# Patient Record
Sex: Female | Born: 1945
Health system: Southern US, Community
[De-identification: ages and names within clinical notes are randomized; demographics above are authoritative.]

## PROBLEM LIST (undated history)

## (undated) DIAGNOSIS — I509 Heart failure, unspecified: Secondary | ICD-10-CM

## (undated) DIAGNOSIS — Z9889 Other specified postprocedural states: Secondary | ICD-10-CM

## (undated) DIAGNOSIS — K219 Gastro-esophageal reflux disease without esophagitis: Secondary | ICD-10-CM

## (undated) DIAGNOSIS — F419 Anxiety disorder, unspecified: Secondary | ICD-10-CM

## (undated) DIAGNOSIS — E785 Hyperlipidemia, unspecified: Secondary | ICD-10-CM

## (undated) DIAGNOSIS — R87629 Unspecified abnormal cytological findings in specimens from vagina: Secondary | ICD-10-CM

## (undated) DIAGNOSIS — M109 Gout, unspecified: Secondary | ICD-10-CM

## (undated) DIAGNOSIS — T7840XA Allergy, unspecified, initial encounter: Secondary | ICD-10-CM

## (undated) DIAGNOSIS — M1990 Unspecified osteoarthritis, unspecified site: Secondary | ICD-10-CM

## (undated) DIAGNOSIS — J449 Chronic obstructive pulmonary disease, unspecified: Secondary | ICD-10-CM

## (undated) DIAGNOSIS — M545 Low back pain, unspecified: Secondary | ICD-10-CM

## (undated) DIAGNOSIS — F32A Depression, unspecified: Secondary | ICD-10-CM

## (undated) DIAGNOSIS — R112 Nausea with vomiting, unspecified: Secondary | ICD-10-CM

## (undated) DIAGNOSIS — I219 Acute myocardial infarction, unspecified: Secondary | ICD-10-CM

## (undated) DIAGNOSIS — R0600 Dyspnea, unspecified: Secondary | ICD-10-CM

## (undated) DIAGNOSIS — R51 Headache: Secondary | ICD-10-CM

## (undated) DIAGNOSIS — I2510 Atherosclerotic heart disease of native coronary artery without angina pectoris: Secondary | ICD-10-CM

## (undated) DIAGNOSIS — H269 Unspecified cataract: Secondary | ICD-10-CM

## (undated) DIAGNOSIS — R011 Cardiac murmur, unspecified: Secondary | ICD-10-CM

## (undated) DIAGNOSIS — Z8489 Family history of other specified conditions: Secondary | ICD-10-CM

## (undated) DIAGNOSIS — N879 Dysplasia of cervix uteri, unspecified: Secondary | ICD-10-CM

## (undated) DIAGNOSIS — T8859XA Other complications of anesthesia, initial encounter: Secondary | ICD-10-CM

## (undated) DIAGNOSIS — G4730 Sleep apnea, unspecified: Secondary | ICD-10-CM

## (undated) DIAGNOSIS — N951 Menopausal and female climacteric states: Secondary | ICD-10-CM

## (undated) DIAGNOSIS — J40 Bronchitis, not specified as acute or chronic: Secondary | ICD-10-CM

## (undated) DIAGNOSIS — I10 Essential (primary) hypertension: Secondary | ICD-10-CM

## (undated) DIAGNOSIS — C801 Malignant (primary) neoplasm, unspecified: Secondary | ICD-10-CM

## (undated) DIAGNOSIS — N3281 Overactive bladder: Secondary | ICD-10-CM

## (undated) HISTORY — DX: Menopausal and female climacteric states: N95.1

## (undated) HISTORY — DX: Allergy, unspecified, initial encounter: T78.40XA

## (undated) HISTORY — PX: ANGIOPLASTY: SHX39

## (undated) HISTORY — DX: Acute myocardial infarction, unspecified: I21.9

## (undated) HISTORY — PX: BREAST SURGERY: SHX581

## (undated) HISTORY — PX: ABDOMINAL HYSTERECTOMY: SHX81

## (undated) HISTORY — DX: Unspecified cataract: H26.9

## (undated) HISTORY — PX: TONSILLECTOMY: SHX5217

## (undated) HISTORY — PX: COLONOSCOPY: SHX174

## (undated) HISTORY — DX: Unspecified abnormal cytological findings in specimens from vagina: R87.629

## (undated) HISTORY — DX: Hyperlipidemia, unspecified: E78.5

## (undated) HISTORY — DX: Dysplasia of cervix uteri, unspecified: N87.9

## (undated) HISTORY — DX: Overactive bladder: N32.81

## (undated) HISTORY — PX: EYE SURGERY: SHX253

## (undated) HISTORY — DX: Depression, unspecified: F32.A

## (undated) HISTORY — DX: Low back pain: M54.5

## (undated) HISTORY — DX: Gout, unspecified: M10.9

## (undated) HISTORY — PX: CARDIAC CATHETERIZATION: SHX172

## (undated) HISTORY — DX: Essential (primary) hypertension: I10

## (undated) HISTORY — DX: Sleep apnea, unspecified: G47.30

## (undated) HISTORY — DX: Heart failure, unspecified: I50.9

## (undated) HISTORY — DX: Low back pain, unspecified: M54.50

## (undated) HISTORY — DX: Headache: R51

---

## 1998-01-09 ENCOUNTER — Ambulatory Visit (HOSPITAL_COMMUNITY): Admission: RE | Admit: 1998-01-09 | Discharge: 1998-01-09 | Payer: Self-pay | Admitting: Internal Medicine

## 1999-03-26 ENCOUNTER — Ambulatory Visit (HOSPITAL_COMMUNITY): Admission: RE | Admit: 1999-03-26 | Discharge: 1999-03-26 | Payer: Self-pay | Admitting: Internal Medicine

## 1999-04-02 ENCOUNTER — Ambulatory Visit (HOSPITAL_COMMUNITY): Admission: RE | Admit: 1999-04-02 | Discharge: 1999-04-02 | Payer: Self-pay | Admitting: Internal Medicine

## 1999-07-23 ENCOUNTER — Other Ambulatory Visit: Admission: RE | Admit: 1999-07-23 | Discharge: 1999-07-23 | Payer: Self-pay | Admitting: Internal Medicine

## 1999-09-23 ENCOUNTER — Ambulatory Visit (HOSPITAL_COMMUNITY): Admission: RE | Admit: 1999-09-23 | Discharge: 1999-09-23 | Payer: Self-pay | Admitting: Internal Medicine

## 1999-09-23 ENCOUNTER — Encounter: Payer: Self-pay | Admitting: Internal Medicine

## 2001-01-26 ENCOUNTER — Ambulatory Visit (HOSPITAL_COMMUNITY): Admission: RE | Admit: 2001-01-26 | Discharge: 2001-01-26 | Payer: Self-pay | Admitting: Internal Medicine

## 2001-01-26 ENCOUNTER — Encounter: Payer: Self-pay | Admitting: Internal Medicine

## 2001-07-09 ENCOUNTER — Other Ambulatory Visit: Admission: RE | Admit: 2001-07-09 | Discharge: 2001-07-09 | Payer: Self-pay | Admitting: Internal Medicine

## 2002-04-08 ENCOUNTER — Ambulatory Visit (HOSPITAL_COMMUNITY): Admission: RE | Admit: 2002-04-08 | Discharge: 2002-04-08 | Payer: Self-pay | Admitting: Internal Medicine

## 2002-04-08 ENCOUNTER — Encounter: Payer: Self-pay | Admitting: Internal Medicine

## 2003-05-15 ENCOUNTER — Encounter: Payer: Self-pay | Admitting: Internal Medicine

## 2003-05-15 ENCOUNTER — Ambulatory Visit (HOSPITAL_COMMUNITY): Admission: RE | Admit: 2003-05-15 | Discharge: 2003-05-15 | Payer: Self-pay | Admitting: Internal Medicine

## 2004-09-02 ENCOUNTER — Ambulatory Visit: Payer: Self-pay | Admitting: Internal Medicine

## 2004-10-15 ENCOUNTER — Ambulatory Visit: Payer: Self-pay | Admitting: Internal Medicine

## 2004-11-25 ENCOUNTER — Ambulatory Visit: Payer: Self-pay | Admitting: Internal Medicine

## 2005-03-07 ENCOUNTER — Ambulatory Visit: Payer: Self-pay | Admitting: Internal Medicine

## 2005-04-18 ENCOUNTER — Ambulatory Visit: Payer: Self-pay | Admitting: Internal Medicine

## 2005-06-09 ENCOUNTER — Ambulatory Visit: Payer: Self-pay | Admitting: Internal Medicine

## 2005-06-14 ENCOUNTER — Ambulatory Visit: Payer: Self-pay | Admitting: Internal Medicine

## 2005-06-14 ENCOUNTER — Other Ambulatory Visit: Admission: RE | Admit: 2005-06-14 | Discharge: 2005-06-14 | Payer: Self-pay | Admitting: Internal Medicine

## 2005-08-04 ENCOUNTER — Ambulatory Visit: Payer: Self-pay | Admitting: Internal Medicine

## 2005-08-15 ENCOUNTER — Ambulatory Visit: Payer: Self-pay | Admitting: Internal Medicine

## 2005-10-17 DIAGNOSIS — I219 Acute myocardial infarction, unspecified: Secondary | ICD-10-CM

## 2005-10-17 HISTORY — DX: Acute myocardial infarction, unspecified: I21.9

## 2005-10-20 ENCOUNTER — Ambulatory Visit: Payer: Self-pay | Admitting: Internal Medicine

## 2006-01-10 ENCOUNTER — Ambulatory Visit (HOSPITAL_COMMUNITY): Admission: RE | Admit: 2006-01-10 | Discharge: 2006-01-10 | Payer: Self-pay | Admitting: Internal Medicine

## 2006-02-15 ENCOUNTER — Encounter: Payer: Self-pay | Admitting: Internal Medicine

## 2006-02-16 ENCOUNTER — Ambulatory Visit: Payer: Self-pay | Admitting: Internal Medicine

## 2006-03-13 ENCOUNTER — Ambulatory Visit: Payer: Self-pay | Admitting: *Deleted

## 2006-03-13 ENCOUNTER — Encounter: Payer: Self-pay | Admitting: *Deleted

## 2006-03-14 ENCOUNTER — Inpatient Hospital Stay (HOSPITAL_COMMUNITY): Admission: EM | Admit: 2006-03-14 | Discharge: 2006-03-17 | Payer: Self-pay | Admitting: *Deleted

## 2006-03-16 ENCOUNTER — Encounter: Payer: Self-pay | Admitting: Cardiology

## 2006-03-20 ENCOUNTER — Ambulatory Visit: Payer: Self-pay | Admitting: Cardiology

## 2006-03-30 ENCOUNTER — Encounter (HOSPITAL_COMMUNITY): Admission: RE | Admit: 2006-03-30 | Discharge: 2006-06-28 | Payer: Self-pay | Admitting: Cardiology

## 2006-04-04 ENCOUNTER — Ambulatory Visit: Payer: Self-pay | Admitting: Cardiology

## 2006-04-25 ENCOUNTER — Ambulatory Visit: Payer: Self-pay

## 2006-04-25 ENCOUNTER — Encounter: Payer: Self-pay | Admitting: Cardiology

## 2006-05-01 ENCOUNTER — Ambulatory Visit: Payer: Self-pay | Admitting: Cardiology

## 2006-05-04 ENCOUNTER — Ambulatory Visit: Payer: Self-pay | Admitting: Cardiology

## 2006-05-08 ENCOUNTER — Ambulatory Visit: Payer: Self-pay | Admitting: Internal Medicine

## 2006-05-08 ENCOUNTER — Ambulatory Visit (HOSPITAL_COMMUNITY): Admission: RE | Admit: 2006-05-08 | Discharge: 2006-05-08 | Payer: Self-pay | Admitting: Internal Medicine

## 2006-05-08 ENCOUNTER — Encounter: Payer: Self-pay | Admitting: Internal Medicine

## 2006-06-07 ENCOUNTER — Ambulatory Visit: Payer: Self-pay | Admitting: Cardiology

## 2006-07-05 ENCOUNTER — Ambulatory Visit: Payer: Self-pay | Admitting: Cardiology

## 2006-07-11 ENCOUNTER — Ambulatory Visit: Payer: Self-pay | Admitting: Internal Medicine

## 2006-07-11 ENCOUNTER — Ambulatory Visit: Payer: Self-pay | Admitting: Cardiology

## 2006-07-11 ENCOUNTER — Ambulatory Visit: Payer: Self-pay

## 2006-07-13 ENCOUNTER — Ambulatory Visit: Payer: Self-pay | Admitting: Cardiology

## 2006-07-18 ENCOUNTER — Ambulatory Visit: Payer: Self-pay | Admitting: Cardiology

## 2006-07-18 ENCOUNTER — Ambulatory Visit (HOSPITAL_COMMUNITY): Admission: RE | Admit: 2006-07-18 | Discharge: 2006-07-18 | Payer: Self-pay | Admitting: Cardiology

## 2006-07-20 ENCOUNTER — Ambulatory Visit: Payer: Self-pay | Admitting: Cardiology

## 2006-08-21 ENCOUNTER — Ambulatory Visit: Payer: Self-pay | Admitting: Internal Medicine

## 2006-08-23 ENCOUNTER — Ambulatory Visit: Payer: Self-pay | Admitting: Internal Medicine

## 2006-09-04 ENCOUNTER — Ambulatory Visit: Payer: Self-pay | Admitting: Cardiology

## 2006-10-20 ENCOUNTER — Ambulatory Visit: Payer: Self-pay | Admitting: Internal Medicine

## 2006-12-18 ENCOUNTER — Ambulatory Visit: Payer: Self-pay | Admitting: Internal Medicine

## 2007-01-16 ENCOUNTER — Inpatient Hospital Stay (HOSPITAL_COMMUNITY): Admission: EM | Admit: 2007-01-16 | Discharge: 2007-01-18 | Payer: Self-pay | Admitting: Emergency Medicine

## 2007-01-16 ENCOUNTER — Ambulatory Visit: Payer: Self-pay | Admitting: Cardiology

## 2007-01-30 ENCOUNTER — Ambulatory Visit: Payer: Self-pay | Admitting: Cardiology

## 2007-02-15 ENCOUNTER — Encounter: Payer: Self-pay | Admitting: Internal Medicine

## 2007-02-15 ENCOUNTER — Ambulatory Visit (HOSPITAL_COMMUNITY): Admission: RE | Admit: 2007-02-15 | Discharge: 2007-02-15 | Payer: Self-pay | Admitting: Internal Medicine

## 2007-05-28 ENCOUNTER — Ambulatory Visit: Payer: Self-pay

## 2007-05-28 ENCOUNTER — Encounter: Payer: Self-pay | Admitting: Cardiology

## 2007-05-30 DIAGNOSIS — R51 Headache: Secondary | ICD-10-CM | POA: Insufficient documentation

## 2007-05-30 DIAGNOSIS — I10 Essential (primary) hypertension: Secondary | ICD-10-CM | POA: Insufficient documentation

## 2007-05-30 DIAGNOSIS — J309 Allergic rhinitis, unspecified: Secondary | ICD-10-CM | POA: Insufficient documentation

## 2007-05-30 DIAGNOSIS — I252 Old myocardial infarction: Secondary | ICD-10-CM | POA: Insufficient documentation

## 2007-05-30 DIAGNOSIS — M545 Low back pain, unspecified: Secondary | ICD-10-CM | POA: Insufficient documentation

## 2007-05-30 DIAGNOSIS — R519 Headache, unspecified: Secondary | ICD-10-CM | POA: Insufficient documentation

## 2007-05-31 ENCOUNTER — Ambulatory Visit: Payer: Self-pay | Admitting: Cardiology

## 2007-08-20 ENCOUNTER — Encounter: Payer: Self-pay | Admitting: Internal Medicine

## 2007-09-27 ENCOUNTER — Ambulatory Visit: Payer: Self-pay | Admitting: Internal Medicine

## 2007-12-05 ENCOUNTER — Ambulatory Visit: Payer: Self-pay | Admitting: Cardiology

## 2007-12-06 ENCOUNTER — Ambulatory Visit: Payer: Self-pay | Admitting: Cardiology

## 2007-12-06 LAB — CONVERTED CEMR LAB
ALT: 25 units/L (ref 0–35)
AST: 26 units/L (ref 0–37)
Albumin: 3.7 g/dL (ref 3.5–5.2)
Alkaline Phosphatase: 60 units/L (ref 39–117)
Bilirubin, Direct: 0.1 mg/dL (ref 0.0–0.3)
Cholesterol: 133 mg/dL (ref 0–200)
HDL: 34.1 mg/dL — ABNORMAL LOW (ref >39.0)
LDL Cholesterol: 73 mg/dL (ref 0–99)
Total Bilirubin: 0.5 mg/dL (ref 0.3–1.2)
Total CHOL/HDL Ratio: 3.9
Total Protein: 7.1 g/dL (ref 6.0–8.3)
Triglycerides: 132 mg/dL (ref 0–149)
VLDL: 26 mg/dL (ref 0–40)

## 2008-02-21 ENCOUNTER — Ambulatory Visit: Payer: Self-pay | Admitting: Internal Medicine

## 2008-04-07 ENCOUNTER — Ambulatory Visit: Payer: Self-pay | Admitting: Internal Medicine

## 2008-04-07 DIAGNOSIS — R109 Unspecified abdominal pain: Secondary | ICD-10-CM | POA: Insufficient documentation

## 2008-04-11 ENCOUNTER — Ambulatory Visit: Payer: Self-pay | Admitting: Internal Medicine

## 2008-04-11 ENCOUNTER — Ambulatory Visit (HOSPITAL_COMMUNITY): Admission: RE | Admit: 2008-04-11 | Discharge: 2008-04-11 | Payer: Self-pay | Admitting: Internal Medicine

## 2008-04-11 DIAGNOSIS — K5900 Constipation, unspecified: Secondary | ICD-10-CM | POA: Insufficient documentation

## 2008-04-11 DIAGNOSIS — K625 Hemorrhage of anus and rectum: Secondary | ICD-10-CM | POA: Insufficient documentation

## 2008-04-11 DIAGNOSIS — R1084 Generalized abdominal pain: Secondary | ICD-10-CM | POA: Insufficient documentation

## 2008-04-11 DIAGNOSIS — K219 Gastro-esophageal reflux disease without esophagitis: Secondary | ICD-10-CM | POA: Insufficient documentation

## 2008-04-23 ENCOUNTER — Telehealth: Payer: Self-pay | Admitting: Internal Medicine

## 2008-04-23 ENCOUNTER — Encounter: Payer: Self-pay | Admitting: Internal Medicine

## 2008-04-25 ENCOUNTER — Telehealth (INDEPENDENT_AMBULATORY_CARE_PROVIDER_SITE_OTHER): Payer: Self-pay | Admitting: *Deleted

## 2008-04-30 ENCOUNTER — Encounter: Payer: Self-pay | Admitting: Internal Medicine

## 2008-04-30 ENCOUNTER — Ambulatory Visit: Payer: Self-pay | Admitting: Internal Medicine

## 2008-04-30 LAB — HM COLONOSCOPY

## 2008-05-02 ENCOUNTER — Telehealth: Payer: Self-pay | Admitting: Internal Medicine

## 2008-05-04 ENCOUNTER — Encounter: Payer: Self-pay | Admitting: Internal Medicine

## 2008-05-13 ENCOUNTER — Telehealth: Payer: Self-pay | Admitting: Internal Medicine

## 2008-05-22 ENCOUNTER — Ambulatory Visit: Payer: Self-pay | Admitting: Internal Medicine

## 2008-05-26 ENCOUNTER — Encounter: Payer: Self-pay | Admitting: Internal Medicine

## 2008-07-21 ENCOUNTER — Ambulatory Visit: Payer: Self-pay

## 2008-07-21 ENCOUNTER — Encounter: Payer: Self-pay | Admitting: Cardiology

## 2008-07-21 LAB — CONVERTED CEMR LAB
ALT: 26 units/L (ref 0–35)
AST: 27 units/L (ref 0–37)
Albumin: 3.6 g/dL (ref 3.5–5.2)
Alkaline Phosphatase: 64 units/L (ref 39–117)
Bilirubin, Direct: 0.1 mg/dL (ref 0.0–0.3)
Cholesterol: 134 mg/dL (ref 0–200)
HDL: 32.7 mg/dL — ABNORMAL LOW (ref >39.0)
LDL Cholesterol: 69 mg/dL (ref 0–99)
Total Bilirubin: 0.5 mg/dL (ref 0.3–1.2)
Total CHOL/HDL Ratio: 4.1
Total Protein: 7.1 g/dL (ref 6.0–8.3)
Triglycerides: 162 mg/dL — ABNORMAL HIGH (ref 0–149)
VLDL: 32 mg/dL (ref 0–40)

## 2008-07-22 ENCOUNTER — Ambulatory Visit: Payer: Self-pay | Admitting: Cardiology

## 2009-01-23 ENCOUNTER — Telehealth: Payer: Self-pay | Admitting: Internal Medicine

## 2009-01-28 ENCOUNTER — Ambulatory Visit: Payer: Self-pay | Admitting: Cardiology

## 2009-01-28 ENCOUNTER — Encounter: Payer: Self-pay | Admitting: Cardiology

## 2009-02-05 ENCOUNTER — Ambulatory Visit: Payer: Self-pay | Admitting: Family Medicine

## 2009-02-05 DIAGNOSIS — Z87891 Personal history of nicotine dependence: Secondary | ICD-10-CM | POA: Insufficient documentation

## 2009-04-08 ENCOUNTER — Ambulatory Visit: Payer: Self-pay | Admitting: Internal Medicine

## 2009-05-01 ENCOUNTER — Ambulatory Visit (HOSPITAL_COMMUNITY): Admission: RE | Admit: 2009-05-01 | Discharge: 2009-05-01 | Payer: Self-pay | Admitting: Internal Medicine

## 2009-05-06 ENCOUNTER — Ambulatory Visit: Payer: Self-pay | Admitting: Internal Medicine

## 2009-07-01 ENCOUNTER — Ambulatory Visit: Payer: Self-pay | Admitting: Internal Medicine

## 2009-07-02 LAB — CONVERTED CEMR LAB
ALT: 28 units/L (ref 0–35)
AST: 27 units/L (ref 0–37)
Albumin: 3.8 g/dL (ref 3.5–5.2)
Alkaline Phosphatase: 66 units/L (ref 39–117)
BUN: 53 mg/dL — ABNORMAL HIGH (ref 6–23)
Basophils Absolute: 0.1 10*3/uL (ref 0.0–0.1)
Basophils Relative: 0.8 % (ref 0.0–3.0)
Bilirubin, Direct: 0 mg/dL (ref 0.0–0.3)
CO2: 26 meq/L (ref 19–32)
Calcium: 8.8 mg/dL (ref 8.4–10.5)
Chloride: 114 meq/L — ABNORMAL HIGH (ref 96–112)
Cholesterol: 132 mg/dL (ref 0–200)
Creatinine, Ser: 1.9 mg/dL — ABNORMAL HIGH (ref 0.4–1.2)
Direct LDL: 68.4 mg/dL
Eosinophils Absolute: 0.3 10*3/uL (ref 0.0–0.7)
Eosinophils Relative: 3.3 % (ref 0.0–5.0)
GFR calc non Af Amer: 28.54 mL/min (ref >60)
Glucose, Bld: 115 mg/dL — ABNORMAL HIGH (ref 70–99)
HCT: 36.3 % (ref 36.0–46.0)
HDL: 27.5 mg/dL — ABNORMAL LOW (ref >39.00)
Hemoglobin: 12.5 g/dL (ref 12.0–15.0)
Lymphocytes Relative: 34 % (ref 12.0–46.0)
Lymphs Abs: 2.9 10*3/uL (ref 0.7–4.0)
MCHC: 34.5 g/dL (ref 30.0–36.0)
MCV: 87.6 fL (ref 78.0–100.0)
Monocytes Absolute: 0.8 10*3/uL (ref 0.1–1.0)
Monocytes Relative: 9.4 % (ref 3.0–12.0)
Neutro Abs: 4.3 10*3/uL (ref 1.4–7.7)
Neutrophils Relative %: 52.5 % (ref 43.0–77.0)
Platelets: 247 10*3/uL (ref 150.0–400.0)
Potassium: 4.7 meq/L (ref 3.5–5.1)
RBC: 4.14 M/uL (ref 3.87–5.11)
RDW: 13.6 % (ref 11.5–14.6)
Sodium: 147 meq/L — ABNORMAL HIGH (ref 135–145)
TSH: 1.69 microintl units/mL (ref 0.35–5.50)
Total Bilirubin: 0.8 mg/dL (ref 0.3–1.2)
Total CHOL/HDL Ratio: 5
Total Protein: 7.4 g/dL (ref 6.0–8.3)
Triglycerides: 326 mg/dL — ABNORMAL HIGH (ref 0.0–149.0)
VLDL: 65.2 mg/dL — ABNORMAL HIGH (ref 0.0–40.0)
WBC: 8.4 10*3/uL (ref 4.5–10.5)

## 2009-07-03 ENCOUNTER — Telehealth: Payer: Self-pay | Admitting: Internal Medicine

## 2009-07-22 ENCOUNTER — Ambulatory Visit: Payer: Self-pay | Admitting: Internal Medicine

## 2009-07-22 DIAGNOSIS — R609 Edema, unspecified: Secondary | ICD-10-CM | POA: Insufficient documentation

## 2009-07-22 DIAGNOSIS — R600 Localized edema: Secondary | ICD-10-CM | POA: Insufficient documentation

## 2009-07-22 LAB — CONVERTED CEMR LAB
BUN: 19 mg/dL (ref 6–23)
CO2: 25 meq/L (ref 19–32)
Calcium: 8.7 mg/dL (ref 8.4–10.5)
Chloride: 109 meq/L (ref 96–112)
Creatinine, Ser: 0.9 mg/dL (ref 0.4–1.2)
GFR calc non Af Amer: 67.59 mL/min (ref >60)
Glucose, Bld: 82 mg/dL (ref 70–99)
Potassium: 4.5 meq/L (ref 3.5–5.1)
Sodium: 143 meq/L (ref 135–145)

## 2009-07-24 ENCOUNTER — Telehealth: Payer: Self-pay | Admitting: Internal Medicine

## 2009-08-11 ENCOUNTER — Ambulatory Visit: Payer: Self-pay | Admitting: Cardiology

## 2009-08-11 ENCOUNTER — Encounter: Payer: Self-pay | Admitting: Cardiology

## 2009-08-11 ENCOUNTER — Ambulatory Visit: Payer: Self-pay

## 2009-08-11 ENCOUNTER — Ambulatory Visit (HOSPITAL_COMMUNITY): Admission: RE | Admit: 2009-08-11 | Discharge: 2009-08-11 | Payer: Self-pay | Admitting: Cardiology

## 2009-08-11 DIAGNOSIS — E782 Mixed hyperlipidemia: Secondary | ICD-10-CM | POA: Insufficient documentation

## 2009-08-11 DIAGNOSIS — E7800 Pure hypercholesterolemia, unspecified: Secondary | ICD-10-CM | POA: Insufficient documentation

## 2009-08-13 LAB — CONVERTED CEMR LAB
ALT: 29 units/L (ref 0–35)
AST: 30 units/L (ref 0–37)
Albumin: 3.6 g/dL (ref 3.5–5.2)
Alkaline Phosphatase: 68 units/L (ref 39–117)
Bilirubin, Direct: 0.1 mg/dL (ref 0.0–0.3)
Cholesterol: 144 mg/dL (ref 0–200)
Direct LDL: 78.3 mg/dL
HDL: 32.8 mg/dL — ABNORMAL LOW (ref >39.00)
Total Bilirubin: 0.6 mg/dL (ref 0.3–1.2)
Total CHOL/HDL Ratio: 4
Total Protein: 7.5 g/dL (ref 6.0–8.3)
Triglycerides: 277 mg/dL — ABNORMAL HIGH (ref 0.0–149.0)
VLDL: 55.4 mg/dL — ABNORMAL HIGH (ref 0.0–40.0)

## 2009-08-18 ENCOUNTER — Ambulatory Visit: Payer: Self-pay | Admitting: Cardiology

## 2009-08-18 DIAGNOSIS — M940 Chondrocostal junction syndrome [Tietze]: Secondary | ICD-10-CM | POA: Insufficient documentation

## 2009-10-08 ENCOUNTER — Telehealth: Payer: Self-pay | Admitting: Internal Medicine

## 2009-12-02 ENCOUNTER — Telehealth: Payer: Self-pay | Admitting: Internal Medicine

## 2009-12-17 ENCOUNTER — Ambulatory Visit: Payer: Self-pay | Admitting: Internal Medicine

## 2010-04-21 ENCOUNTER — Telehealth: Payer: Self-pay | Admitting: Family Medicine

## 2010-05-24 ENCOUNTER — Ambulatory Visit (HOSPITAL_COMMUNITY): Admission: RE | Admit: 2010-05-24 | Discharge: 2010-05-24 | Payer: Self-pay | Admitting: Internal Medicine

## 2010-05-25 ENCOUNTER — Ambulatory Visit: Payer: Self-pay | Admitting: Family Medicine

## 2010-05-25 DIAGNOSIS — N3000 Acute cystitis without hematuria: Secondary | ICD-10-CM | POA: Insufficient documentation

## 2010-05-25 LAB — CONVERTED CEMR LAB
Bilirubin Urine: NEGATIVE
Glucose, Urine, Semiquant: NEGATIVE
Ketones, urine, test strip: NEGATIVE
Nitrite: NEGATIVE
Protein, U semiquant: 30
Specific Gravity, Urine: >=1.03
Urobilinogen, UA: 0.2
pH: 5

## 2010-06-07 ENCOUNTER — Telehealth: Payer: Self-pay | Admitting: Internal Medicine

## 2010-06-24 LAB — HM MAMMOGRAPHY: HM Mammogram: NEGATIVE

## 2010-07-02 ENCOUNTER — Ambulatory Visit: Payer: Self-pay | Admitting: Internal Medicine

## 2010-07-02 DIAGNOSIS — N318 Other neuromuscular dysfunction of bladder: Secondary | ICD-10-CM | POA: Insufficient documentation

## 2010-07-02 DIAGNOSIS — F980 Enuresis not due to a substance or known physiological condition: Secondary | ICD-10-CM | POA: Insufficient documentation

## 2010-07-02 LAB — CONVERTED CEMR LAB
Bilirubin Urine: NEGATIVE
Glucose, Urine, Semiquant: NEGATIVE
Ketones, urine, test strip: NEGATIVE
Nitrite: NEGATIVE
Specific Gravity, Urine: >=1.03
Urobilinogen, UA: 0.2
WBC Urine, dipstick: NEGATIVE
pH: 5

## 2010-07-30 ENCOUNTER — Telehealth: Payer: Self-pay | Admitting: Internal Medicine

## 2010-10-20 ENCOUNTER — Telehealth: Payer: Self-pay | Admitting: Internal Medicine

## 2010-11-16 NOTE — Assessment & Plan Note (Signed)
Summary: DIR CONS COL/ON PLAVIX/RH   History of Present Illness Visit Type: consult Primary GI MD: Yancey Flemings MD Primary Provider: Eleonore Chiquito, MD Requesting Provider: Eleonore Chiquito, MD Chief Complaint: abdominal pain/epigastric & lower History of Present Illness:   65 year old female with multiple medical problems including coronary artery disease, hypertension, hyperlipidemia, and depression. She also has had prior coronary artery stent placement for which she is on aspirin and Plavix. She is sent now regarding multiple GI complaints and the need for colonoscopy. She has had some problems with constipation chronically. Also some alternating bowel habits. For constipation she was placed on Amitiza. However, this resulted in unacceptable diarrhea.she also had a one-week history of epigastric and lower abdominal discomfort. She complains of frequent acid reflux as well as belching bloating and gas. Occasional nausea but no vomiting. She has seen some bright red blood in the toilet and on the tissue. She has not had prior colonoscopy or endoscopy.             Prior Medications Reviewed Using: List Brought by Patient  Updated Prior Medication List: CRESTOR 10 MG TABS (ROSUVASTATIN CALCIUM) Take 1 tablet by mouth once a day LISINOPRIL 5 MG TABS (LISINOPRIL) Take 1 tablet by mouth once a day METOPROLOL SUCCINATE 25 MG TB24 (METOPROLOL SUCCINATE) Take 2  tablet by mouth twice a day NITROGLYCERIN 0.4 MG SUBL (NITROGLYCERIN) 1 tablet under tongue as directed PLAVIX 75 MG TABS (CLOPIDOGREL BISULFATE) Take 1 tablet by mouth once a day ZETIA 10 MG TABS (EZETIMIBE) Take 1 tablet by mouth once a day VICODIN 5-500 MG  TABS (HYDROCODONE-ACETAMINOPHEN) 1 two times a day as needed ALPRAZOLAM 0.25 MG  TABS (ALPRAZOLAM) 1 two times a day as needed ADULT ASPIRIN LOW STRENGTH 81 MG  TBDP (ASPIRIN) 1 once daily MULTI-VITAMIN   TABS (MULTIPLE VITAMIN)  FLAXSEED OIL 1000 MG  CAPS (FLAXSEED  (LINSEED))  ZYRTEC ALLERGY 10 MG  TABS (CETIRIZINE HCL) 1 once daily PROTONIX 40 MG  TBEC (PANTOPRAZOLE SODIUM) once daily AMITIZA 24 MCG  CAPS (LUBIPROSTONE) once daily  Current Allergies (reviewed today): ! ERYTHROMYCIN SULFAMETHOXAZOLE (SULFAMETHOXAZOLE)  Past Medical History:    Reviewed history from 05/30/2007 and no changes required:       Headache       Hypertension       Myocardial infarction, hx of       High Cholesterol       Menopausal Syndrome       Allergic rhinitis       Low back pain  Past Surgical History:    Reviewed history from 04/07/2008 and no changes required:       Tonsillectomy       sigmoidoscopy August 2003       Angioplasty/stent- 2007   Family History:    Reviewed history from 09/27/2007 and no changes required:       adopted  Social History:    Occupation: Chemical engineer [employer]    Married with 2 children    Patient is a former smoker.     Illicit Drug Use - no    Alcohol Use - no   Risk Factors:  Tobacco use:  quit Drug use:  no Alcohol use:  no   Review of Systems       allergy and sinus trouble, anxiety, arthritis, back pain, depression, fatigue, headaches, heart murmur, muscle pains, night sweats, insomnia, sore throat, urinary leakage. All other systems negative   Vital Signs:  Patient Profile:  65 Years Old Female Height:     66 inches Weight:      168.50 pounds BMI:     27.29 Pulse rate:   80 / minute Pulse rhythm:   regular BP sitting:   120 / 82  (right arm)  Vitals Entered By: June McMurray CMA (April 11, 2008 10:45 AM)                  Physical Exam  General:     Well developed, well nourished, no acute distress. Head:     Normocephalic and atraumatic. Eyes:     PERRLA, no icterus. Nose:     No deformity, discharge,  or lesions. Mouth:     No deformity or lesions, dentition normal. Neck:     Supple; no masses or thyromegaly. Lungs:     Clear throughout to auscultation. Heart:      Regular rate and rhythm; soft systolic murmur,without  rubs,  or bruits. Abdomen:     Obese,Soft, nontender and nondistended. No masses, hepatosplenomegaly or hernias noted. Normal bowel sounds. Rectal:     deferred Msk:     Symmetrical with no gross deformities. Normal posture. Pulses:     Normal pulses noted. Extremities:     No clubbing, cyanosis, edema or deformities noted. Neurologic:     Alert and  oriented x4;  grossly normal neurologically. Skin:     Intact without significant lesions or rashes. Psych:     Alert and cooperative. Normal mood and affect.    Impression & Recommendations:  Problem # 1:  GERD (ICD-530.81) Chronic gastroesophageal reflux disease. No alarm features.  Plan: #1. Protonix 40 mg p.o. q. day #2. Reflux precautions #3. Upper endoscopy to exclude complications from reflux disease as well as evaluate abdominal pain. The nature of the procedure as well as the risks benefits and alternatives were reviewed. She understood and agreed to proceed.  Problem # 2:  CONSTIPATION (ICD-564.00) likely functional. May have irritable bowel.   Plan: MiraLax for constipation. Titrate to need.  Problem # 3:  RECTAL BLEEDING (ICD-569.3) Minor bleeding. Likely due to benign anorectal pathology. Rule out neoplasia.  PLAN: #1. Colonoscopy. The nature of the procedure as well as the risks benefits and alternatives were reviewed. She understood and agreed to proceed. #2. We will check with Dr. Riley Kill to see if it is okay to hold Plavix 5 days prior to her procedures. She should stay on aspirin.  Problem # 4:  ABDOMINAL PAIN-GENERALIZED (ICD-789.07) Likely due to a combination of irritable bowel and reflux.workup as planned above  Problem # 5:  SCREENING COLORECTAL-CANCER (ICD-V76.51) Patient is an appropriate candidate for screening colonoscopy. No contraindications. Issue with Plavix as discussed above.     Prescriptions: MOVIPREP 100 GM  SOLR  (PEG-KCL-NACL-NASULF-NA ASC-C) As per prep instructions.  #1 x 0   Entered by:   Milford Cage CMA   Authorized by:   Hilarie Fredrickson MD   Signed by:   Milford Cage CMA on 04/11/2008   Method used:   Electronically sent to ...       [id number]       [address]       [place], [place]  [address]       Ph: [phone] or [phone]       Fax: [phone]   RxID:   [id number]  ] Copy to: Dr. Eleonore Chiquito; Dr. Shawnie Pons

## 2010-11-16 NOTE — Miscellaneous (Signed)
  Clinical Lists Changes  Medications: Changed medication from VICODIN 5-500 MG  TABS (HYDROCODONE-ACETAMINOPHEN) to VICODIN 5-500 MG  TABS (HYDROCODONE-ACETAMINOPHEN) 1 two times a day as needed - Signed Rx of VICODIN 5-500 MG  TABS (HYDROCODONE-ACETAMINOPHEN) 1 two times a day as needed;  #50 x 1;  Signed;  Entered by: Raechel Ache, RN;  Authorized by: Gordy Savers  MD;  Method used: Historical    Prescriptions: VICODIN 5-500 MG  TABS (HYDROCODONE-ACETAMINOPHEN) 1 two times a day as needed  #50 x 1   Entered by:   Raechel Ache, RN   Authorized by:   Gordy Savers  MD   Signed by:   Raechel Ache, RN on 08/20/2007   Method used:   Historical   RxID:   [id number]

## 2010-11-16 NOTE — Letter (Signed)
Summary: Patient Notice- Polyp Results  [facility]  [address] [place], [place] [address]   Phone: [phone]  Fax: [phone]        May 04, 2008 MRN: [id number]    Tracy [name] [address] [place], [place]  [address]    Dear Ms. Renovato,  I am pleased to inform you that the colon polyp(s) removed during your recent colonoscopy was (were) found to be benign (no cancer detected) upon pathologic examination.  I recommend you have a repeat colonoscopy examination in 5 years to look for recurrent polyps, as having colon polyps increases your risk for having recurrent polyps or even colon cancer in the future.  Should you develop new or worsening symptoms of abdominal pain, bowel habit changes or bleeding from the rectum or bowels, please schedule an evaluation with either your primary care physician or with me.  Additional information/recommendations:  _X_ No further action with gastroenterology is needed at this time. Please      follow-up with your primary care physician for your other healthcare      needs.  Please call us if you are having persistent problems or have questions about your condition that have not been fully answered at this time.  Sincerely,  Hilarie Fredrickson MD  This letter has been electronically signed by your physician.

## 2010-11-16 NOTE — Assessment & Plan Note (Signed)
Summary: follow up on swollen ankles/cjr   Vital Signs:  Patient profile:   66 year old female Weight:      185 pounds BMI:     29.97 Temp:     98.6 degrees F oral BP sitting:   154 / 92  (left arm) Cuff size:   regular  Vitals Entered By: Raechel Ache, RN (July 22, 2009 8:04 AM) CC: ROV- still retaining fluid, ankles and legs hurt. Is Patient Diabetic? No   Primary Care Provider:  Eleonore Chiquito, MD  CC:  ROV- still retaining fluid and ankles and legs hurt..  History of Present Illness: 65 year old and a for follow-up.  she history of coronary artery disease and is followed by cardiology.  For the past 4 months.  She has had some complaints of pedal edema that worsens towards the end of the day.  One month ago, lisinopril, hydrochlorothiazide, was held due to elevation of creatinine.  Since that time.  She feels that her fluid retention has worsened slightly.  There has been modest weight gain of a couple of pounds.  She walks daily in the morning and pedal edema seemed to worsen towards the end of the day.  She describes an occasional mild chest pain, but no exertional chest pain with activity.  Preventive Screening-Counseling & Management  Alcohol-Tobacco     Smoking Status: quit < 6 months  Allergies: 1)  ! Erythromycin 2)  Sulfamethoxazole (Sulfamethoxazole)  Past History:  Past Medical History: Reviewed history from 05/06/2009 and no changes required. Headache Hypertension Myocardial infarction, hx of High Cholesterol Menopausal Syndrome Allergic rhinitis Low back pain tobacco use  Past Surgical History: Reviewed history from 07/01/2009 and no changes required. Tonsillectomy sigmoidoscopy August 2003 Angioplasty/stent- 2007 colonoscopy July 2009  Review of Systems       The patient complains of weight gain and peripheral edema.  The patient denies anorexia, fever, weight loss, vision loss, decreased hearing, hoarseness, chest pain, syncope, dyspnea  on exertion, prolonged cough, headaches, hemoptysis, abdominal pain, melena, hematochezia, severe indigestion/heartburn, hematuria, incontinence, genital sores, muscle weakness, suspicious skin lesions, transient blindness, difficulty walking, depression, unusual weight change, abnormal bleeding, enlarged lymph nodes, angioedema, and breast masses.    Physical Exam  General:  overweight-appearing.  160/94, pulse 84overweight-appearing.   Head:  Normocephalic and atraumatic without obvious abnormalities. No apparent alopecia or balding. Mouth:  Oral mucosa and oropharynx without lesions or exudates.  Teeth in good repair. Lungs:  Normal respiratory effort, chest expands symmetrically. Lungs are clear to auscultation, no crackles or wheezes. Heart:  Normal rate and regular rhythm. S1 and S2 normal without gallop, murmur, click, rub or other extra sounds. Abdomen:  Bowel sounds positive,abdomen soft and non-tender without masses, organomegaly or hernias noted. Msk:  No deformity or scoliosis noted of thoracic or lumbar spine.   Extremities:  no clinical edema ( early a.m.)   Impression & Recommendations:  Problem # 1:  MYOCARDIAL INFARCTION, HX OF (ICD-412)  Her updated medication list for this problem includes:    Nitroglycerin 0.4 Mg Subl (Nitroglycerin) .Marland Kitchen... 1 tablet under tongue as directed    Plavix 75 Mg Tabs (Clopidogrel bisulfate) .Marland Kitchen... Take 1 tablet by mouth once a day    Adult Aspirin Low Strength 81 Mg Tbdp (Aspirin) .Marland Kitchen... 1 once daily    Lisinopril-hydrochlorothiazide 20-12.5 Mg Tabs (Lisinopril-hydrochlorothiazide) ..... One daily  will check a be met today and if renal studies have normalized.  Consider resuming at a lower dose  Her updated  medication list for this problem includes:    Nitroglycerin 0.4 Mg Subl (Nitroglycerin) .Marland Kitchen... 1 tablet under tongue as directed    Plavix 75 Mg Tabs (Clopidogrel bisulfate) .Marland Kitchen... Take 1 tablet by mouth once a day    Adult Aspirin Low  Strength 81 Mg Tbdp (Aspirin) .Marland Kitchen... 1 once daily    Lisinopril-hydrochlorothiazide 20-12.5 Mg Tabs (Lisinopril-hydrochlorothiazide) ..... One daily  Problem # 2:  HYPERTENSION (ICD-401.9)  Her updated medication list for this problem includes:    Lisinopril-hydrochlorothiazide 20-12.5 Mg Tabs (Lisinopril-hydrochlorothiazide) ..... One daily will increase metoprolol to 100 b.i.d.  Her updated medication list for this problem includes:    Lisinopril-hydrochlorothiazide 20-12.5 Mg Tabs (Lisinopril-hydrochlorothiazide) ..... One daily  Problem # 3:  PEDAL EDEMA (ICD-782.3)  Her updated medication list for this problem includes:    Lisinopril-hydrochlorothiazide 20-12.5 Mg Tabs (Lisinopril-hydrochlorothiazide) ..... One daily will check electrolytes, and if normal will consider resuming at a lower dose    Her updated medication list for this problem includes:    Lisinopril-hydrochlorothiazide 20-12.5 Mg Tabs (Lisinopril-hydrochlorothiazide) ..... One daily  Orders: Venipuncture (69629) TLB-BMP (Basic Metabolic Panel-BMET) (80048-METABOL)  Complete Medication List: 1)  Crestor 10 Mg Tabs (Rosuvastatin calcium) .... Take 1 tablet by mouth once a day 2)  Nitroglycerin 0.4 Mg Subl (Nitroglycerin) .Marland Kitchen.. 1 tablet under tongue as directed 3)  Plavix 75 Mg Tabs (Clopidogrel bisulfate) .... Take 1 tablet by mouth once a day 4)  Zetia 10 Mg Tabs (Ezetimibe) .... Take 1 tablet by mouth once a day 5)  Alprazolam 0.25 Mg Tabs (Alprazolam) .Marland Kitchen.. 1 two times a day as needed 6)  Adult Aspirin Low Strength 81 Mg Tbdp (Aspirin) .Marland Kitchen.. 1 once daily 7)  Zyrtec Allergy 10 Mg Tabs (Cetirizine hcl) .Marland Kitchen.. 1 once daily 8)  Pantoprazole Sodium 40 Mg Tbec (Pantoprazole sodium) .Marland Kitchen.. 1 each day 30 minutes before meal 9)  Metoprolol Tartrate 100 Mg Tabs (metoprolol Tartrate)  .... Take one tablet by mouth twice a day 10)  Hydrocodone-acetaminophen 5-500 Mg Tabs (Hydrocodone-acetaminophen) .... As needed 11)  Vitamin C  1.000mg  Tablet  .... Take 1 tablet by mouth once a day 12)  Calcium Plus Viitamin D 1.000mg  Tablet  .... Take 1 tablet by mouth once a day 13)  Fish Oil 1200 Mg Caps (Omega-3 fatty acids) .... Once daily 14)  Chantix Starting Month Pak 0.5 Mg X 11 & 1 Mg X 42 Tabs (Varenicline tartrate) .... As instructed 15)  Skelaxin 800 Mg Tabs (Metaxalone) .... One by mouth q 8 hours as needed 16)  Chantix Starting Month Pak 0.5 Mg X 11 & 1 Mg X 42 Tabs (Varenicline tartrate) .... Use as directed 17)  Chantix Continuing Month Pak 1 Mg Tabs (Varenicline tartrate) .... Use as directed 18)  Lisinopril-hydrochlorothiazide 20-12.5 Mg Tabs (Lisinopril-hydrochlorothiazide) .... One daily  Patient Instructions: 1)  Limit your Sodium (Salt). 2)  It is important that you exercise regularly at least 20 minutes 5 times a week. If you develop chest pain, have severe difficulty breathing, or feel very tired , stop exercising immediately and seek medical attention. 3)  You need to lose weight. Consider a lower calorie diet and regular exercise.  4)  Please schedule a follow-up appointment in 3 months. Prescriptions: METOPROLOL TARTRATE 100 MG TABS (METOPROLOL TARTRATE) Take one tablet by mouth twice a day  #180 x 4   Entered and Authorized by:   Gordy Savers  MD   Signed by:   Gordy Savers  MD on 07/22/2009  Method used:   Print then Give to Patient   RxID:   [id number]

## 2010-11-16 NOTE — Progress Notes (Signed)
  Phone Note From Other Clinic   Caller: Nurse Call For: Dr Lamar Sprinkles Nurse Summary of Call: Herbert Seta from Dr Rosalyn Charters nurse called, she said he was out but Dr Eden Emms said she could be off plavix for 5 days prior to [facility] on 04-30-08.         Appended Document:  Per Dr Eden Emms, he said pt could be off Plavix for 5 days, ECL on 04-30-08.  Lowry Ram CMA

## 2010-11-16 NOTE — Procedures (Signed)
Summary: EGD   EGD  Procedure date:  04/30/2008  Findings:      Location: [facility]    Patient Name: Tracy [name], Tracy [name] MRN:  Procedure Procedures: Panendoscopy (EGD) CPT: 43235.  Personnel: Endoscopist: Wilhemina Bonito. Marina Goodell, MD.  Referred By: Gordy Savers, MD.  Exam Location: Exam performed in Outpatient Clinic. Outpatient  Patient Consent: Procedure, Alternatives, Risks and Benefits discussed, consent obtained, from patient. Consent was obtained by the RN.  Indications Symptoms: Abdominal pain, location: epigastric. Reflux symptoms  History  Current Medications: Patient is not currently taking Coumadin.  Pre-Exam Physical: Performed Apr 30, 2008  Cardio-pulmonary exam, HEENT exam, Abdominal exam, Mental status exam WNL.  Comments: Pt. history reviewed/updated, physical exam performed prior to initiation of sedation?YES Exam Exam Info: Maximum depth of insertion Duodenum, intended Duodenum. Patient position: on left side. Vocal cords visualized. Gastric retroflexion performed. Images taken. ASA Classification: III. Tolerance: excellent.  Sedation Meds: Patient assessed and found to be appropriate for moderate (conscious) sedation. Residual sedation present from prior procedure today. Versed 3 mg. given IV. Cetacaine Spray 2 sprays  Monitoring: BP and pulse monitoring done. Oximetry used. Supplemental O2 given  Findings - Normal: Proximal Esophagus to Distal Esophagus. Comments: NO BARRETT'S.  HIATAL HERNIA: ICD9: Hernia, Hiatal: 553.3. - Normal: Body to Duodenal 2nd Portion.   Assessment  Diagnoses: 553.3: Hernia, Hiatal.  530.81: GERD.   Events  Unplanned Intervention: No unplanned interventions were required.  Unplanned Events: There were no complications. Plans Medication(s): PPI: Pantoprazole/Protonix 40 mg QD, for #30 ; 11 REFILLS.   Disposition: After procedure patient sent to recovery. After recovery patient sent home.    Scheduling: Follow-up prn.   cc:  Meryl Dare, MD  This report was created from the original endoscopy report, which was reviewed and signed by the above listed endoscopist.

## 2010-11-16 NOTE — Assessment & Plan Note (Signed)
Summary: sinus/headache/rash on face/cjr   Vital Signs:  Patient profile:   65 year old female Weight:      185 pounds Temp:     98.6 degrees F oral BP sitting:   110 / 78  (right arm) Cuff size:   large  Vitals Entered By: Duard Brady LPN (December 17, 1608 4:25 PM) CC: c/o cough, sinus drainage , headaches , rash on cheeks - otc not helping  Is Patient Diabetic? No   Primary Care Provider:  Eleonore Chiquito, MD  CC:  c/o cough, sinus drainage , headaches , and rash on cheeks - otc not helping .  History of Present Illness: 65 year old patient who has a history of treated hypertension and dyslipidemia.  She presents with a one-week history of sore throat, cough, sinus congestion, pressure and headaches.  She's also noted a slight erythematous rash and swelling involving the malar area of her face.  No fever, localized sinus pain, drainage, or chills.  She does use Zyrtec daily and has a history of allergic rhinitis.  Preventive Screening-Counseling & Management  Alcohol-Tobacco     Smoking Status: quit  Allergies: 1)  ! Erythromycin 2)  Sulfamethoxazole (Sulfamethoxazole)  Past History:  Past Medical History: Reviewed history from 05/06/2009 and no changes required. Headache Hypertension Myocardial infarction, hx of High Cholesterol Menopausal Syndrome Allergic rhinitis Low back pain tobacco use  Social History: Smoking Status:  quit  Physical Exam  General:  Well-developed,well-nourished,in no acute distress; alert,appropriate and cooperative throughout examination Head:  slight puffiness along the malar area of the face with mild erythema.  No significant sinus tenderness Eyes:  No corneal or conjunctival inflammation noted. EOMI. Perrla. Funduscopic exam benign, without hemorrhages, exudates or papilledema. Vision grossly normal. Ears:  External ear exam shows no significant lesions or deformities.  Otoscopic examination reveals clear canals, tympanic  membranes are intact bilaterally without bulging, retraction, inflammation or discharge. Hearing is grossly normal bilaterally. Mouth:  Oral mucosa and oropharynx without lesions or exudates.  Teeth in good repair. Neck:  No deformities, masses, or tenderness noted. Lungs:  Normal respiratory effort, chest expands symmetrically. Lungs are clear to auscultation, no crackles or wheezes. Heart:  Normal rate and regular rhythm. S1 and S2 normal without gallop, murmur, click, rub or other extra sounds.   Impression & Recommendations:  Problem # 1:  ALLERGIC RHINITIS (ICD-477.9)  Her updated medication list for this problem includes:    Zyrtec Allergy 10 Mg Tabs (Cetirizine hcl) .Marland Kitchen... 1 once daily  Her updated medication list for this problem includes:    Zyrtec Allergy 10 Mg Tabs (Cetirizine hcl) .Marland Kitchen... 1 once daily  Problem # 2:  HYPERTENSION (ICD-401.9)  Her updated medication list for this problem includes:    Lisinopril-hydrochlorothiazide 20-12.5 Mg Tabs (Lisinopril-hydrochlorothiazide) ..... One daily  Her updated medication list for this problem includes:    Lisinopril-hydrochlorothiazide 20-12.5 Mg Tabs (Lisinopril-hydrochlorothiazide) ..... One daily  Complete Medication List: 1)  Crestor 10 Mg Tabs (Rosuvastatin calcium) .... Take 1 tablet by mouth once a day 2)  Nitroglycerin 0.4 Mg Subl (Nitroglycerin) .Marland Kitchen.. 1 tablet under tongue as directed 3)  Plavix 75 Mg Tabs (Clopidogrel bisulfate) .... Take 1 tablet by mouth once a day 4)  Zetia 10 Mg Tabs (Ezetimibe) .... Take 1 tablet by mouth once a day 5)  Alprazolam 0.25 Mg Tabs (Alprazolam) .Marland Kitchen.. 1 two times a day as needed 6)  Adult Aspirin Low Strength 81 Mg Tbdp (Aspirin) .Marland Kitchen.. 1 once daily 7)  Zyrtec Allergy 10 Mg Tabs (Cetirizine hcl) .Marland Kitchen.. 1 once daily 8)  Pantoprazole Sodium 40 Mg Tbec (Pantoprazole sodium) .Marland Kitchen.. 1 each day 30 minutes before meal 9)  Metoprolol Tartrate 100 Mg Tabs (metoprolol Tartrate)  .... Take one tablet by  mouth twice a day 10)  Hydrocodone-acetaminophen 5-500 Mg Tabs (Hydrocodone-acetaminophen) .... As needed 11)  Vitamin C 1.000mg  Tablet  .... Take 1 tablet by mouth once a day 12)  Calcium Plus Viitamin D 1.000mg  Tablet  .... Take 1 tablet by mouth once a day 13)  Fish Oil 1200 Mg Caps (Omega-3 fatty acids) .... Once daily 14)  Lisinopril-hydrochlorothiazide 20-12.5 Mg Tabs (Lisinopril-hydrochlorothiazide) .... One daily 15)  Prednisone 10 Mg Tabs (Prednisone) .... One twice daily  Patient Instructions: 1)  Please schedule a follow-up appointment in 3 months. 2)  Limit your Sodium (Salt) to less than 2 grams a day(slightly less than 1/2 a teaspoon) to prevent fluid retention, swelling, or worsening of symptoms. 3)  It is important that you exercise regularly at least 20 minutes 5 times a week. If you develop chest pain, have severe difficulty breathing, or feel very tired , stop exercising immediately and seek medical attention. 4)  You need to lose weight. Consider a lower calorie diet and regular exercise.  5)  Check your Blood Pressure regularly. If it is above:  140/90 you should make an appointment. Prescriptions: PREDNISONE 10 MG TABS (PREDNISONE) one twice daily  #20 x 0   Entered and Authorized by:   Gordy Savers  MD   Signed by:   Gordy Savers  MD on 12/17/2009   Method used:   Print then Give to Patient   RxID:   [id number]

## 2010-11-16 NOTE — Assessment & Plan Note (Signed)
Summary: 3 month rov/njr   Vital Signs:  Patient Profile:   65 Years Old Female Height:     66 inches Weight:      172 pounds Temp:     98.1 degrees F oral BP sitting:   120 / 76  (left arm) Cuff size:   regular  Vitals Entered By: Raechel Ache, RN (May 22, 2008 4:45 PM)                 PCP:  Eleonore Chiquito, MD  Chief Complaint:  ROV.Marland Kitchen  History of Present Illness: 65 year old female seen today for follow up.  She has coronary artery disease, hypertension, and dyslipidemia.  She is followed by annually by cardiology and has had a recent GI evaluation.  Today she is doing well.  Denies any cardiopulmonary complaints.  Her abdominal pain has resolved and she is on a high fiber diet for diverticular disease    Current Allergies: ! ERYTHROMYCIN SULFAMETHOXAZOLE (SULFAMETHOXAZOLE)  Past Medical History:    Reviewed history from 05/30/2007 and no changes required:       Headache       Hypertension       Myocardial infarction, hx of       High Cholesterol       Menopausal Syndrome       Allergic rhinitis       Low back pain     Review of Systems  The patient denies anorexia, fever, weight loss, weight gain, vision loss, decreased hearing, hoarseness, chest pain, syncope, dyspnea on exertion, peripheral edema, prolonged cough, headaches, hemoptysis, abdominal pain, melena, hematochezia, severe indigestion/heartburn, hematuria, incontinence, genital sores, muscle weakness, suspicious skin lesions, transient blindness, difficulty walking, depression, unusual weight change, abnormal bleeding, enlarged lymph nodes, angioedema, and breast masses.     Physical Exam  General:     Well-developed,well-nourished,in no acute distress; alert,appropriate and cooperative throughout examination Head:     Normocephalic and atraumatic without obvious abnormalities. No apparent alopecia or balding. Mouth:     Oral mucosa and oropharynx without lesions or exudates.  Teeth in good  repair. Neck:     No deformities, masses, or tenderness noted. Lungs:     Normal respiratory effort, chest expands symmetrically. Lungs are clear to auscultation, no crackles or wheezes. Heart:     Normal rate and regular rhythm. S1 and S2 normal without gallop, murmur, click, rub or other extra sounds. Abdomen:     Bowel sounds positive,abdomen soft and non-tender without masses, organomegaly or hernias noted.    Impression & Recommendations:  Problem # 1:  GERD (ICD-530.81)  The following medications were removed from the medication list:    Protonix 40 Mg Tbec (Pantoprazole sodium) ..... Once daily  Her updated medication list for this problem includes:    Pantoprazole Sodium 40 Mg Tbec (Pantoprazole sodium) .Marland Kitchen... 1 each day 30 minutes before meal   Problem # 2:  HYPERTENSION (ICD-401.9)  Her updated medication list for this problem includes:    Lisinopril 5 Mg Tabs (Lisinopril) .Marland Kitchen... Take 1 tablet by mouth once a day    Metoprolol Succinate 25 Mg Tb24 (Metoprolol succinate) .Marland Kitchen... Take 2  tablet by mouth twice a day   Complete Medication List: 1)  Crestor 10 Mg Tabs (Rosuvastatin calcium) .... Take 1 tablet by mouth once a day 2)  Lisinopril 5 Mg Tabs (Lisinopril) .... Take 1 tablet by mouth once a day 3)  Metoprolol Succinate 25 Mg Tb24 (Metoprolol succinate) .... Take 2  tablet by mouth twice a day 4)  Nitroglycerin 0.4 Mg Subl (Nitroglycerin) .Marland Kitchen.. 1 tablet under tongue as directed 5)  Plavix 75 Mg Tabs (Clopidogrel bisulfate) .... Take 1 tablet by mouth once a day 6)  Zetia 10 Mg Tabs (Ezetimibe) .... Take 1 tablet by mouth once a day 7)  Vicodin 5-500 Mg Tabs (Hydrocodone-acetaminophen) .Marland Kitchen.. 1 two times a day as needed 8)  Alprazolam 0.25 Mg Tabs (Alprazolam) .Marland Kitchen.. 1 two times a day as needed 9)  Adult Aspirin Low Strength 81 Mg Tbdp (Aspirin) .Marland Kitchen.. 1 once daily 10)  Multi-vitamin Tabs (Multiple vitamin) 11)  Flaxseed Oil 1000 Mg Caps (Flaxseed (linseed)) 12)  Zyrtec  Allergy 10 Mg Tabs (Cetirizine hcl) .Marland Kitchen.. 1 once daily 13)  Pantoprazole Sodium 40 Mg Tbec (Pantoprazole sodium) .Marland Kitchen.. 1 each day 30 minutes before meal   Patient Instructions: 1)  Limit your Sodium (Salt) to less than 2 grams a day(slightly less than 1/2 a teaspoon) to prevent fluid retention, swelling, or worsening of symptoms. 2)  It is important that you exercise regularly at least 20 minutes 5 times a week. If you develop chest pain, have severe difficulty breathing, or feel very tired , stop exercising immediately and seek medical attention. 3)  Please schedule a follow-up appointment in 6 months.   ]

## 2010-11-16 NOTE — Progress Notes (Signed)
Summary: refill hydrocodone acetamin  Phone Note Refill Request Message from:  Fax from Pharmacy on July 30, 2010 12:19 PM  hydrocodone acetamin. 5/500 1 by mouth as needed  [facility] [address]   Method Requested: Fax to Local Pharmacy Initial call taken by: Duard Brady LPN,  July 30, 2010 12:19 PM  Follow-up for Phone Call        #50 Follow-up by: Gordy Savers  MD,  July 30, 2010 12:26 PM    Prescriptions: HYDROCODONE-ACETAMINOPHEN 5-500 MG TABS (HYDROCODONE-ACETAMINOPHEN) as needed  #50 x 0   Entered by:   Duard Brady LPN   Authorized by:   Gordy Savers  MD   Signed by:   Duard Brady LPN on 24/40/1027   Method used:   Historical   RxID:   [id number]  [facility]   KIK

## 2010-11-16 NOTE — Assessment & Plan Note (Signed)
Summary: ? bladder infection//ccm   Vital Signs:  Patient profile:   65 year old female Weight:      180 pounds BMI:     29.16 Temp:     98.4 degrees F oral BP sitting:   110 / 80  (left arm) Cuff size:   regular  Vitals Entered By: Raechel Ache, RN (May 25, 2010 9:33 AM) CC: C/o frequency, back pain and urinating small amounts.   History of Present Illness: Here for 2 days of urgency to urinate and mild low back pains. No burning or fever or nausea. Drinking plenty of water.   Allergies: 1)  ! Erythromycin 2)  Sulfamethoxazole (Sulfamethoxazole)  Past History:  Past Medical History: Reviewed history from 05/06/2009 and no changes required. Headache Hypertension Myocardial infarction, hx of High Cholesterol Menopausal Syndrome Allergic rhinitis Low back pain tobacco use  Past Surgical History: Reviewed history from 07/01/2009 and no changes required. Tonsillectomy sigmoidoscopy August 2003 Angioplasty/stent- 2007 colonoscopy July 2009  Review of Systems  The patient denies anorexia, fever, weight loss, weight gain, vision loss, decreased hearing, hoarseness, chest pain, syncope, dyspnea on exertion, peripheral edema, prolonged cough, headaches, hemoptysis, abdominal pain, melena, hematochezia, severe indigestion/heartburn, hematuria, incontinence, genital sores, muscle weakness, suspicious skin lesions, transient blindness, difficulty walking, depression, unusual weight change, abnormal bleeding, enlarged lymph nodes, angioedema, breast masses, and testicular masses.    Physical Exam  General:  Well-developed,well-nourished,in no acute distress; alert,appropriate and cooperative throughout examination Abdomen:  Bowel sounds positive,abdomen soft and non-tender without masses, organomegaly or hernias noted.   Impression & Recommendations:  Problem # 1:  ACUTE CYSTITIS (ICD-595.0)  Her updated medication list for this problem includes:    Cipro 500 Mg Tabs  (Ciprofloxacin hcl) .Marland Kitchen..Marland Kitchen Two times a day  Orders: UA Dipstick w/o Micro (manual) (59563)  Complete Medication List: 1)  Crestor 10 Mg Tabs (Rosuvastatin calcium) .... Take 1 tablet by mouth once a day 2)  Nitroglycerin 0.4 Mg Subl (Nitroglycerin) .Marland Kitchen.. 1 tablet under tongue as directed 3)  Plavix 75 Mg Tabs (Clopidogrel bisulfate) .... Take 1 tablet by mouth once a day 4)  Zetia 10 Mg Tabs (Ezetimibe) .... Take 1 tablet by mouth once a day 5)  Alprazolam 0.25 Mg Tabs (Alprazolam) .Marland Kitchen.. 1 two times a day as needed 6)  Adult Aspirin Low Strength 81 Mg Tbdp (Aspirin) .Marland Kitchen.. 1 once daily 7)  Zyrtec Allergy 10 Mg Tabs (Cetirizine hcl) .Marland Kitchen.. 1 once daily 8)  Pantoprazole Sodium 40 Mg Tbec (Pantoprazole sodium) .Marland Kitchen.. 1 each day 30 minutes before meal 9)  Metoprolol Tartrate 100 Mg Tabs (metoprolol Tartrate)  .... Take one tablet by mouth twice a day 10)  Hydrocodone-acetaminophen 5-500 Mg Tabs (Hydrocodone-acetaminophen) .... As needed 11)  Vitamin C 1.000mg  Tablet  .... Take 1 tablet by mouth once a day 12)  Calcium Plus Viitamin D 1.000mg  Tablet  .... Take 1 tablet by mouth once a day 13)  Fish Oil 1200 Mg Caps (Omega-3 fatty acids) .... Once daily 14)  Lisinopril-hydrochlorothiazide 20-12.5 Mg Tabs (Lisinopril-hydrochlorothiazide) .... One daily 15)  Cipro 500 Mg Tabs (Ciprofloxacin hcl) .... Two times a day  Patient Instructions: 1)  Please schedule a follow-up appointment as needed .  Prescriptions: CIPRO 500 MG TABS (CIPROFLOXACIN HCL) two times a day  #14 x 0   Entered and Authorized by:   Nelwyn Salisbury MD   Signed by:   Nelwyn Salisbury MD on 05/25/2010   Method used:   [facility] to  [facility] [phone]* (retail)       [address]       [place], [place]  [address]       Ph: [phone] or [phone]       Fax: [phone]   RxID:   [id number]   Laboratory Results   Urine Tests    Routine Urinalysis   Color: lt. yellow Appearance: Hazy Glucose:  negative   (Normal Range: Negative) Bilirubin: negative   (Normal Range: Negative) Ketone: negative   (Normal Range: Negative) Spec. Gravity: >=1.030   (Normal Range: 1.003-1.035) Blood: small   (Normal Range: Negative) pH: 5.0   (Normal Range: 5.0-8.0) Protein: 30   (Normal Range: Negative) Urobilinogen: 0.2   (Normal Range: 0-1) Nitrite: negative   (Normal Range: Negative) Leukocyte Esterace: trace   (Normal Range: Negative)

## 2010-11-16 NOTE — Miscellaneous (Signed)
Summary: BONE DENSITY  Clinical Lists Changes  Orders: Added new Test order of T-Bone Densitometry (77080) - Signed Added new Test order of T-Lumbar Vertebral Assessment (77082) - Signed 

## 2010-11-16 NOTE — Progress Notes (Signed)
Summary: xanax  refill  Phone Note Refill Request Message from:  Fax from Pharmacy on January 23, 2009 12:11 PM  Refills Requested: Medication #1:  ALPRAZOLAM 0.25 MG  TABS 1 two times a day as needed Initial call taken by: Kern Reap CMA,  January 23, 2009 12:11 PM      Prescriptions: ALPRAZOLAM 0.25 MG  TABS (ALPRAZOLAM) 1 two times a day as needed  #50 x 2   Entered by:   Kern Reap CMA   Authorized by:   Gordy Savers  MD   Signed by:   Kern Reap CMA on 01/23/2009   Method used:   Telephoned to ...       [facility] [phone][facility] (retail)       [address]       [place], [place]  [address]       Ph: [phone] or [phone]       Fax: [phone]   RxID:   [id number]

## 2010-11-16 NOTE — Progress Notes (Signed)
Summary: WANTS LAB RESULTS  Phone Note Call from Patient Call back at Home Phone [phone]   Caller: Patient-[facility]IVE CALL Reason for Call: Talk to Nurse, Lab or Test Results Summary of Call: WANTS LAB RESULTS AND ADVICE OF MEDS. Initial call taken by: Warnell Forester,  July 24, 2009 10:51 AM  Follow-up for Phone Call        resume lisinopril hctz  1/2 tab daily- all lab has normalized Follow-up by: Gordy Savers  MD,  July 24, 2009 12:57 PM  Additional Follow-up for Phone Call Additional follow up Details #1::        Phone Call Completed Additional Follow-up by: Raechel Ache, RN,  July 24, 2009 1:24 PM

## 2010-11-16 NOTE — Assessment & Plan Note (Signed)
Summary: abd pain/mhf   Vital Signs:  Patient Profile:   65 Years Old Female Weight:      170 pounds Temp:     98.2 degrees F oral BP sitting:   140 / 72  (left arm) Cuff size:   regular  Vitals Entered By: Raechel Ache, RN (April 07, 2008 11:09 AM)                 Chief Complaint:  C/o abd pain since Sat. Was constipated and took laxative with results. Taking Gas-X. Nauseated- no vomiting. Sitting or walking makes it worse.Tracy [name]  History of Present Illness: 65 year old patient seen today for follow-up 3 days ago she began having some mild lower abdominal pain.  She felt a bit constipated and took and OTC laxatives with some benefit.  At the present time.  She continues to have some crampy lower abdominal pain associate with some nausea when she has pain.  There has been no vomiting, and her appetite has been maintained.  She has had no GI operations.  She had a sigmoidoscopy in 2003, but no prior colonoscopies.  She describes some scanty bright red rectal bleeding from her hemorrhoids. Following  the use of the laxatives.  She had a fairly normal bowel movement yesterday.    Current Allergies: ! ERYTHROMYCIN SULFAMETHOXAZOLE (SULFAMETHOXAZOLE)  Past Medical History:    Reviewed history from 05/30/2007 and no changes required:       Headache       Hypertension       Myocardial infarction, hx of       High Cholesterol       Menopausal Syndrome       Allergic rhinitis       Low back pain  Past Surgical History:    Tonsillectomy    sigmoidoscopy August 2003     Review of Systems       The patient complains of abdominal pain and hematochezia.  The patient denies fever, weight loss, severe indigestion/heartburn, and unusual weight change.     Physical Exam  General:     Well-developed,well-nourished,in no acute distress; alert,appropriate and cooperative throughout examination Head:     Normocephalic and atraumatic without obvious abnormalities. No apparent alopecia or  balding. Mouth:     Oral mucosa and oropharynx without lesions or exudates.  Teeth in good repair. Neck:     No deformities, masses, or tenderness noted. Lungs:     Normal respiratory effort, chest expands symmetrically. Lungs are clear to auscultation, no crackles or wheezes. Heart:     Normal rate and regular rhythm. S1 and S2 normal without gallop, murmur, click, rub or other extra sounds. Abdomen:     there is very mild tenderness in both the epigastric area and lower quadrants.  There is no distention guarding.  Bowel sounds were present    Impression & Recommendations:  Problem # 1:  ABDOMINAL PAIN (ICD-789.00)  Problem # 2:  MYOCARDIAL INFARCTION, HX OF (ICD-412)  Her updated medication list for this problem includes:    Lisinopril 5 Mg Tabs (Lisinopril) .Tracy [name]... Take 1 tablet by mouth once a day    Metoprolol Succinate 25 Mg Tb24 (Metoprolol succinate) .Tracy [name]... Take 2  tablet by mouth twice a day    Nitroglycerin 0.4 Mg Subl (Nitroglycerin) .Tracy [name]... 1 tablet under tongue as directed    Plavix 75 Mg Tabs (Clopidogrel bisulfate) .Tracy [name]... Take 1 tablet by mouth once a day    Adult Aspirin Low Strength 81 Mg  Tbdp (Aspirin) .Tracy [name]... 1 once daily   Complete Medication List: 1)  Crestor 10 Mg Tabs (Rosuvastatin calcium) .... Take 1 tablet by mouth once a day 2)  Lisinopril 5 Mg Tabs (Lisinopril) .... Take 1 tablet by mouth once a day 3)  Metoprolol Succinate 25 Mg Tb24 (Metoprolol succinate) .... Take 2  tablet by mouth twice a day 4)  Nitroglycerin 0.4 Mg Subl (Nitroglycerin) .Tracy [name].. 1 tablet under tongue as directed 5)  Plavix 75 Mg Tabs (Clopidogrel bisulfate) .... Take 1 tablet by mouth once a day 6)  Zetia 10 Mg Tabs (Ezetimibe) .... Take 1 tablet by mouth once a day 7)  Vicodin 5-500 Mg Tabs (Hydrocodone-acetaminophen) .Tracy [name].. 1 two times a day as needed 8)  Alprazolam 0.25 Mg Tabs (Alprazolam) .Tracy [name].. 1 two times a day as needed 9)  Adult Aspirin Low Strength 81 Mg Tbdp (Aspirin) .Tracy [name].. 1 once  daily 10)  Multi-vitamin Tabs (Multiple vitamin) 11)  Flaxseed Oil 1000 Mg Caps (Flaxseed (linseed)) 12)  Bl Echinacea-goldenseal 112.5-25 Mg Tabs (Echinacea-goldenseal) 13)  Diclofenac Sodium 75 Mg Tbec (Diclofenac sodium) .... One twice daily 14)  Zyrtec Allergy 10 Mg Tabs (Cetirizine hcl) .Tracy [name].. 1 once daily   Patient Instructions: 1)  Please schedule a follow-up appointment as needed. 2)  Drink as much fluid as you can tolerate for the next few days. 3)  It is important that you exercise regularly at least 20 minutes 5 times a week. If you develop chest pain, have severe difficulty breathing, or feel very tired , stop exercising immediately and seek medical attention. 4)  Amitiza one daily 5)  Protonix one daily   ]

## 2010-11-16 NOTE — Progress Notes (Signed)
Summary: ecl on 7-15  Phone Note Call from Patient Call back at Home Phone [phone]   Caller: Patient Call For: Kalila Adkison Reason for Call: Talk to Nurse Details for Reason: ECL on 7-15 Summary of Call: ECL on 7-15. has not heard anything yet from our office Dr was supposeed to get in touch w- Dr Riley Kill [id number]. Initial call taken by: Guadlupe [facility],  April 23, 2008 11:59 AM  Follow-up for Phone Call        called Dr. Riley Kill office and asked how long to be off Plavix prior to procedure on 04-30-08 Attn: Dr. Rosalyn Charters nurse Leotis Shames.  She will call back with answer.  Milford Cage CMA  April 23, 2008 2:55 PM Called Dr. Riley Kill again and asked for Lauren. She was not in office today and talked with Asher Muir and told her I called and faxed letter to Dr. Rosalyn Charters office in regards to pt.'s Plavix.  Expressed my concerns that noone has called me back with response as pt. needs to stop today.  She stated she will have one of the other nurses return my call today.  Milford Cage CMA  April 25, 2008 1:41 PM ,Received response letter today and pt. notified.  Milford Cage CMA  April 25, 2008 1:44 PM

## 2010-11-16 NOTE — Progress Notes (Signed)
Summary: Pt req refill of Zetia through [facility] order  Phone Note Call from Patient Call back at [facility] Phone [phone]   Caller: Patient Summary of Call: Pt called req refill of Zetia via [facility] mail order pharmacy.  Initial call taken by: Lucy Antigua,  December 02, 2009 8:48 AM    Prescriptions: ZETIA 10 MG TABS (EZETIMIBE) Take 1 tablet by mouth once a day  #90 x 3   Entered by:   Duard Brady LPN   Authorized by:   Gordy Savers  MD   Signed by:   Duard Brady LPN on 09/81/1914   Method used:   Electronically to        [facility]* (mail-order)             ,          Ph: [phone]       Fax: [phone]   RxID:   [id number]

## 2010-11-16 NOTE — Assessment & Plan Note (Signed)
Summary: swollen hands and ankles//ccm   Vital Signs:  Patient profile:   65 year old female Weight:      175 pounds BMI:     28.35 Temp:     98.2 degrees F oral BP sitting:   150 / 100  (left arm) Cuff size:   regular  Vitals Entered By: Raechel Ache, RN (April 08, 2009 10:11 AM)  Primary Care Provider:  Eleonore Chiquito, MD  CC:  C/o hands and feet swelling and sore.Marland Kitchen  History of Present Illness: 65 year old patient who is in today for follow-up.  she has hypertension coronary artery disease with prior MI.  She is followed by cardiology.  She is on statin therapy.  Three complaints today include worsening pedal edema.  Serial blood pressure readings have been consistently high for the past couple of months.  Her back pain is better.  Denies any chest pain  Allergies: 1)  ! Erythromycin 2)  Sulfamethoxazole (Sulfamethoxazole)  Past History:  Past Medical History: Reviewed history from 05/30/2007 and no changes required. Headache Hypertension Myocardial infarction, hx of High Cholesterol Menopausal Syndrome Allergic rhinitis Low back pain  Past Surgical History: Reviewed history from 04/11/2008 and no changes required. Tonsillectomy sigmoidoscopy August 2003 Angioplasty/stent- 2007  Review of Systems       The patient complains of peripheral edema.  The patient denies anorexia, fever, weight loss, weight gain, vision loss, decreased hearing, hoarseness, chest pain, syncope, dyspnea on exertion, prolonged cough, headaches, hemoptysis, abdominal pain, melena, hematochezia, severe indigestion/heartburn, hematuria, incontinence, genital sores, muscle weakness, suspicious skin lesions, transient blindness, difficulty walking, depression, unusual weight change, abnormal bleeding, enlarged lymph nodes, angioedema, and breast masses.    Physical Exam  General:  overweight-appearing.  150/100overweight-appearing.   Head:  Normocephalic and atraumatic without obvious  abnormalities. No apparent alopecia or balding. Eyes:  No corneal or conjunctival inflammation noted. EOMI. Perrla. Funduscopic exam benign, without hemorrhages, exudates or papilledema. Vision grossly normal. Mouth:  Oral mucosa and oropharynx without lesions or exudates.  Teeth in good repair. Neck:  No deformities, masses, or tenderness noted. Lungs:  Normal respiratory effort, chest expands symmetrically. Lungs are clear to auscultation, no crackles or wheezes. Heart:  Normal rate and regular rhythm. S1 and S2 normal without gallop, murmur, click, rub or other extra sounds. Abdomen:  Bowel sounds positive,abdomen soft and non-tender without masses, organomegaly or hernias noted. Extremities:  trace left pedal edema and trace right pedal edema.  trace left pedal edema.     Impression & Recommendations:  Problem # 1:  HYPERTENSION (ICD-401.9)  The following medications were removed from the medication list:    Lisinopril 5 Mg Tabs (Lisinopril) .Marland Kitchen... Take 1 tablet by mouth once a day Her updated medication list for this problem includes:    Metoprolol Tartrate 50 Mg Tabs (Metoprolol tartrate) .Marland Kitchen... Take one tablet by mouth twice a day    Lisinopril-hydrochlorothiazide 20-12.5 Mg Tabs (Lisinopril-hydrochlorothiazide) ..... One daily  The following medications were removed from the medication list:    Lisinopril 5 Mg Tabs (Lisinopril) .Marland Kitchen... Take 1 tablet by mouth once a day Her updated medication list for this problem includes:    Metoprolol Tartrate 50 Mg Tabs (Metoprolol tartrate) .Marland Kitchen... Take one tablet by mouth twice a day    Lisinopril-hydrochlorothiazide 20-12.5 Mg Tabs (Lisinopril-hydrochlorothiazide) ..... One daily  Problem # 2:  GERD (ICD-530.81)  Her updated medication list for this problem includes:    Pantoprazole Sodium 40 Mg Tbec (Pantoprazole sodium) .Marland Kitchen... 1 each day 30  minutes before meal  Her updated medication list for this problem includes:    Pantoprazole Sodium 40  Mg Tbec (Pantoprazole sodium) .Marland Kitchen... 1 each day 30 minutes before meal  Problem # 3:  MYOCARDIAL INFARCTION, HX OF (ICD-412)  The following medications were removed from the medication list:    Lisinopril 5 Mg Tabs (Lisinopril) .Marland Kitchen... Take 1 tablet by mouth once a day Her updated medication list for this problem includes:    Nitroglycerin 0.4 Mg Subl (Nitroglycerin) .Marland Kitchen... 1 tablet under tongue as directed    Plavix 75 Mg Tabs (Clopidogrel bisulfate) .Marland Kitchen... Take 1 tablet by mouth once a day    Adult Aspirin Low Strength 81 Mg Tbdp (Aspirin) .Marland Kitchen... 1 once daily    Metoprolol Tartrate 50 Mg Tabs (Metoprolol tartrate) .Marland Kitchen... Take one tablet by mouth twice a day    Lisinopril-hydrochlorothiazide 20-12.5 Mg Tabs (Lisinopril-hydrochlorothiazide) ..... One daily  The following medications were removed from the medication list:    Lisinopril 5 Mg Tabs (Lisinopril) .Marland Kitchen... Take 1 tablet by mouth once a day Her updated medication list for this problem includes:    Nitroglycerin 0.4 Mg Subl (Nitroglycerin) .Marland Kitchen... 1 tablet under tongue as directed    Plavix 75 Mg Tabs (Clopidogrel bisulfate) .Marland Kitchen... Take 1 tablet by mouth once a day    Adult Aspirin Low Strength 81 Mg Tbdp (Aspirin) .Marland Kitchen... 1 once daily    Metoprolol Tartrate 50 Mg Tabs (Metoprolol tartrate) .Marland Kitchen... Take one tablet by mouth twice a day    Lisinopril-hydrochlorothiazide 20-12.5 Mg Tabs (Lisinopril-hydrochlorothiazide) ..... One daily  Complete Medication List: 1)  Crestor 10 Mg Tabs (Rosuvastatin calcium) .... Take 1 tablet by mouth once a day 2)  Nitroglycerin 0.4 Mg Subl (Nitroglycerin) .Marland Kitchen.. 1 tablet under tongue as directed 3)  Plavix 75 Mg Tabs (Clopidogrel bisulfate) .... Take 1 tablet by mouth once a day 4)  Zetia 10 Mg Tabs (Ezetimibe) .... Take 1 tablet by mouth once a day 5)  Alprazolam 0.25 Mg Tabs (Alprazolam) .Marland Kitchen.. 1 two times a day as needed 6)  Adult Aspirin Low Strength 81 Mg Tbdp (Aspirin) .Marland Kitchen.. 1 once daily 7)  Zyrtec Allergy 10 Mg  Tabs (Cetirizine hcl) .Marland Kitchen.. 1 once daily 8)  Pantoprazole Sodium 40 Mg Tbec (Pantoprazole sodium) .Marland Kitchen.. 1 each day 30 minutes before meal 9)  Metoprolol Tartrate 50 Mg Tabs (Metoprolol tartrate) .... Take one tablet by mouth twice a day 10)  Hydrocodone-acetaminophen 5-500 Mg Tabs (Hydrocodone-acetaminophen) .... As needed 11)  Vitamin C 1.000mg  Tablet  .... Take 1 tablet by mouth once a day 12)  Calcium Plus Viitamin D 1.000mg  Tablet  .... Take 1 tablet by mouth once a day 13)  Fish Oil 1200 Mg Caps (Omega-3 fatty acids) .... Once daily 14)  Chantix Starting Month Pak 0.5 Mg X 11 & 1 Mg X 42 Tabs (Varenicline tartrate) .... As instructed 15)  Skelaxin 800 Mg Tabs (Metaxalone) .... One by mouth q 8 hours as needed 16)  Chantix Starting Month Pak 0.5 Mg X 11 & 1 Mg X 42 Tabs (Varenicline tartrate) .... Use as directed 17)  Chantix Continuing Month Pak 1 Mg Tabs (Varenicline tartrate) .... Use as directed 18)  Lisinopril-hydrochlorothiazide 20-12.5 Mg Tabs (Lisinopril-hydrochlorothiazide) .... One daily  Patient Instructions: 1)  Please schedule a follow-up appointment in 1 month. 2)  Limit your Sodium (Salt). 3)  It is important that you exercise regularly at least 20 minutes 5 times a week. If you develop chest pain, have severe difficulty breathing, or  feel very tired , stop exercising immediately and seek medical attention. 4)  You need to lose weight. Consider a lower calorie diet and regular exercise.  Prescriptions: LISINOPRIL-HYDROCHLOROTHIAZIDE 20-12.5 MG TABS (LISINOPRIL-HYDROCHLOROTHIAZIDE) one daily  #30 x 3   Entered and Authorized by:   Gordy Savers  MD   Signed by:   Gordy Savers  MD on 04/08/2009   Method used:   Print then Give to Patient   RxID:   [id number] LISINOPRIL-HYDROCHLOROTHIAZIDE 20-12.5 MG TABS (LISINOPRIL-HYDROCHLOROTHIAZIDE) one daily  #90 x 3   Entered and Authorized by:   Gordy Savers  MD   Signed by:   Gordy Savers  MD on  04/08/2009   Method used:   Print then Give to Patient   RxID:   [id number] METOPROLOL TARTRATE 50 MG TABS (METOPROLOL TARTRATE) Take one tablet by mouth twice a day  #180 x 33   Entered and Authorized by:   Gordy Savers  MD   Signed by:   Gordy Savers  MD on 04/08/2009   Method used:   Print then Give to Patient   RxID:   [id number] PANTOPRAZOLE SODIUM 40 MG  TBEC (PANTOPRAZOLE SODIUM) 1 each day 30 minutes before meal  #90 x 3   Entered and Authorized by:   Gordy Savers  MD   Signed by:   Gordy Savers  MD on 04/08/2009   Method used:   Print then Give to Patient   RxID:   [id number] ALPRAZOLAM 0.25 MG  TABS (ALPRAZOLAM) 1 two times a day as needed  #50 x 3   Entered and Authorized by:   Gordy Savers  MD   Signed by:   Gordy Savers  MD on 04/08/2009   Method used:   Print then Give to Patient   RxID:   [id number] ZETIA 10 MG TABS (EZETIMIBE) Take 1 tablet by mouth once a day  #90 x 3   Entered and Authorized by:   Gordy Savers  MD   Signed by:   Gordy Savers  MD on 04/08/2009   Method used:   Print then Give to Patient   RxID:   [id number] PLAVIX 75 MG TABS (CLOPIDOGREL BISULFATE) Take 1 tablet by mouth once a day  #90 x 3   Entered and Authorized by:   Gordy Savers  MD   Signed by:   Gordy Savers  MD on 04/08/2009   Method used:   Print then Give to Patient   RxID:   [id number] NITROGLYCERIN 0.4 MG SUBL (NITROGLYCERIN) 1 tablet under tongue as directed  #bottle x 3   Entered and Authorized by:   Gordy Savers  MD   Signed by:   Gordy Savers  MD on 04/08/2009   Method used:   Print then Give to Patient   RxID:   [id number] CRESTOR 10 MG TABS (ROSUVASTATIN CALCIUM) Take 1 tablet by mouth once a day  #90 x 3   Entered and Authorized by:   Gordy Savers  MD   Signed by:   Gordy Savers  MD on 04/08/2009   Method used:   Print then Give to Patient    RxID:   [id number]

## 2010-11-16 NOTE — Assessment & Plan Note (Signed)
Summary: [name]  Medications Added METOPROLOL TARTRATE 50 MG TABS (METOPROLOL TARTRATE) Take one tablet by mouth twice a day HYDROCODONE-ACETAMINOPHEN 5-500 MG TABS (HYDROCODONE-ACETAMINOPHEN) as needed * VITAMIN C 1.000MG  TABLET Take 1 tablet by mouth once a day * CALCIUM PLUS VIITAMIN D 1.000MG  TABLET Take 1 tablet by mouth once a day        Visit Type:  Follow-up Primary Provider:  Eleonore Chiquito, MD  CC:  chest discomfort.  History of Present Illness: A little discomfort in parasternal area.  Sometimes tender.  No pressure in chest with activity.  Occasional indigestion.  Ocassional diaphoresis.  Pt has a fair amount of stress.  Pt has resumed smoking, so has ont completely quit..  Current Medications (verified): 1)  Crestor 10 Mg Tabs (Rosuvastatin Calcium) .... Take 1 Tablet By Mouth Once A Day 2)  Lisinopril 5 Mg Tabs (Lisinopril) .... Take 1 Tablet By Mouth Once A Day 3)  Nitroglycerin 0.4 Mg Subl (Nitroglycerin) .Marland Kitchen.. 1 Tablet Under Tongue As Directed 4)  Plavix 75 Mg Tabs (Clopidogrel Bisulfate) .... Take 1 Tablet By Mouth Once A Day 5)  Zetia 10 Mg Tabs (Ezetimibe) .... Take 1 Tablet By Mouth Once A Day 6)  Alprazolam 0.25 Mg  Tabs (Alprazolam) .Marland Kitchen.. 1 Two Times A Day As Needed 7)  Adult Aspirin Low Strength 81 Mg  Tbdp (Aspirin) .Marland Kitchen.. 1 Once Daily 8)  Zyrtec Allergy 10 Mg  Tabs (Cetirizine Hcl) .Marland Kitchen.. 1 Once Daily 9)  Pantoprazole Sodium 40 Mg  Tbec (Pantoprazole Sodium) .Marland Kitchen.. 1 Each Day 30 Minutes Before Meal 10)  Metoprolol Tartrate 50 Mg Tabs (Metoprolol Tartrate) .... Take One Tablet By Mouth Twice A Day 11)  Hydrocodone-Acetaminophen 5-500 Mg Tabs (Hydrocodone-Acetaminophen) .... As Needed 12)  Vitamin C 1.000mg  Tablet .... Take 1 Tablet By Mouth Once A Day 13)  Calcium Plus Viitamin D 1.000mg  Tablet .... Take 1 Tablet By Mouth Once A Day  Allergies: 1)  ! Erythromycin 2)  Sulfamethoxazole (Sulfamethoxazole)  Vital Signs:  Patient profile:   65 year  old female Height:      66 inches Weight:      178.25 pounds BMI:     28.87 Pulse rate:   81 / minute Pulse rhythm:   regular Resp:     18 per minute BP sitting:   136 / 84  (left arm) Cuff size:   regular  Vitals Entered By: Judithe Modest CMA (January 28, 2009 3:40 PM)  Physical Exam  General:  Well developed, well nourished, in no acute distress. Lungs:  Clear A and P Heart:  regular.  Normal S1 and S2.  No definitie murmur even with careful auscultation. Extremities:  Edema.   EKG  Procedure date:  01/28/2009  Findings:      Normal SR.  Normal  EKG.   h Impression & Recommendations:  Problem # 1:  MYOCARDIAL INFARCTION, HX OF (ICD-412) Stable at last cath.  Continued smoking.  Counseling given.  Encouraged D/c.  Followup 6 months with lipid and liver, and 2decho to assess MR. The following medications were removed from the medication list:    Metoprolol Succinate 25 Mg Tb24 (Metoprolol succinate) .Marland Kitchen... Take 2  tablet by mouth twice a day Her updated medication list for this problem includes:    Lisinopril 5 Mg Tabs (Lisinopril) .Marland Kitchen... Take 1 tablet by mouth once a day    Nitroglycerin 0.4 Mg Subl (Nitroglycerin) .Marland Kitchen... 1 tablet under tongue as directed    Plavix 75 Mg Tabs (  Clopidogrel bisulfate) .Marland Kitchen... Take 1 tablet by mouth once a day    Adult Aspirin Low Strength 81 Mg Tbdp (Aspirin) .Marland Kitchen... 1 once daily    Metoprolol Tartrate 50 Mg Tabs (Metoprolol tartrate) .Marland Kitchen... Take one tablet by mouth twice a day  Patient Instructions: 1)  Your physician recommends that you return for a FASTING lipid profile: in 6 months  2)  Your physician wants you to follow-up in:   6 months. You will receive a reminder letter in the mail two months in advance. If you don't receive a letter, please call our office to schedule the follow-up appointment. 3)  Your physician has requested that you have an echocardiogram.  Echocardiography is a painless test that uses sound waves to create images of your  heart. It provides your doctor with information about the size and shape of your heart and how well your heart's chambers and valves are working.  This procedure takes approximately one hour. There are no restrictions for this procedure. (6 months) 4)  Your physician recommends that you continue on your current medications as directed. Please refer to the Current Medication list given to you today.

## 2010-11-16 NOTE — Progress Notes (Signed)
  Phone Note Call from Patient   Caller: Patient Call For: Gordy Savers  MD Summary of Call: given msg about stopping BP pill- concerned about retaining more fluid- that's why she came in. Can she take something else? [facility] Initial call taken by: Raechel Ache, RN,  July 03, 2009 9:50 AM  Follow-up for Phone Call        decrease salt; elevate but no diuretics; ROV next week Follow-up by: Gordy Savers  MD,  July 03, 2009 12:41 PM  Additional Follow-up for Phone Call Additional follow up Details #1::        Phone Call Completed Additional Follow-up by: Raechel Ache, RN,  July 03, 2009 1:04 PM

## 2010-11-16 NOTE — Letter (Signed)
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         Sincerely,    Gordy Savers  MD

## 2010-11-16 NOTE — Assessment & Plan Note (Signed)
Summary: back pain/njr rsc appt time/njr   Vital Signs:  Patient Profile:   65 Years Old Female Weight:      165 pounds Temp:     98.6 degrees F oral BP sitting:   120 / 70  (left arm) Cuff size:   regular  Vitals Entered By: Raechel Ache, RN (Feb 21, 2008 11:22 AM)                 Chief Complaint:  C/o LBP and hemorrhoids..  History of Present Illness: 65 year old patient seen today for follow-up.  Medical problems include hypertension, and dyslipidemia.  Complaints today include low back pain, which has been problematic in the past.    Current Allergies: ! ERYTHROMYCIN SULFAMETHOXAZOLE (SULFAMETHOXAZOLE)  Past Medical History:    Reviewed history from 05/30/2007 and no changes required:       Headache       Hypertension       Myocardial infarction, hx of       High Cholesterol       Menopausal Syndrome       Allergic rhinitis       Low back pain      Physical Exam  General:     Well-developed,well-nourished,in no acute distress; alert,appropriate and cooperative throughout examination Eyes:     No corneal or conjunctival inflammation noted. EOMI. Perrla. Funduscopic exam benign, without hemorrhages, exudates or papilledema. Vision grossly normal. Mouth:     Oral mucosa and oropharynx without lesions or exudates.  Teeth in good repair. Neck:     No deformities, masses, or tenderness noted. Lungs:     Normal respiratory effort, chest expands symmetrically. Lungs are clear to auscultation, no crackles or wheezes. Heart:     Normal rate and regular rhythm. S1 and S2 normal without gallop, murmur, click, rub or other extra sounds. Msk:     No deformity or scoliosis noted of thoracic or lumbar spine.      Impression & Recommendations:  Problem # 1:  LOW BACK PAIN (ICD-724.2)  Her updated medication list for this problem includes:    Vicodin 5-500 Mg Tabs (Hydrocodone-acetaminophen) .Marland Kitchen... 1 two times a day as needed    Adult Aspirin Low Strength 81 Mg  Tbdp (Aspirin) .Marland Kitchen... 1 once daily    Diclofenac Sodium 75 Mg Tbec (Diclofenac sodium) ..... One twice daily   Problem # 2:  HYPERTENSION (ICD-401.9)  Her updated medication list for this problem includes:    Lisinopril 5 Mg Tabs (Lisinopril) .Marland Kitchen... Take 1 tablet by mouth once a day    Metoprolol Succinate 25 Mg Tb24 (Metoprolol succinate) .Marland Kitchen... Take 2  tablet by mouth twice a day   Complete Medication List: 1)  Crestor 10 Mg Tabs (Rosuvastatin calcium) .... Take 1 tablet by mouth once a day 2)  Lisinopril 5 Mg Tabs (Lisinopril) .... Take 1 tablet by mouth once a day 3)  Metoprolol Succinate 25 Mg Tb24 (Metoprolol succinate) .... Take 2  tablet by mouth twice a day 4)  Nitroglycerin 0.4 Mg Subl (Nitroglycerin) .Marland Kitchen.. 1 tablet under tongue as directed 5)  Plavix 75 Mg Tabs (Clopidogrel bisulfate) .... Take 1 tablet by mouth once a day 6)  Zetia 10 Mg Tabs (Ezetimibe) .... Take 1 tablet by mouth once a day 7)  Vicodin 5-500 Mg Tabs (Hydrocodone-acetaminophen) .Marland Kitchen.. 1 two times a day as needed 8)  Alprazolam 0.25 Mg Tabs (Alprazolam) .Marland Kitchen.. 1 two times a day as needed 9)  Adult Aspirin Low Strength  81 Mg Tbdp (Aspirin) .Marland Kitchen.. 1 once daily 10)  Multi-vitamin Tabs (Multiple vitamin) 11)  Flaxseed Oil 1000 Mg Caps (Flaxseed (linseed)) 12)  Bl Echinacea-goldenseal 112.5-25 Mg Tabs (Echinacea-goldenseal) 13)  Diclofenac Sodium 75 Mg Tbec (Diclofenac sodium) .... One twice daily 14)  Zyrtec Allergy 10 Mg Tabs (Cetirizine hcl) .Marland Kitchen.. 1 once daily  Other Orders: Gastroenterology Referral (GI)   Patient Instructions: 1)   follow stretching exercises for low back pain as discussed; 2)  use muscle relaxant as dispensed 3)  call if unimproved 4)  Please schedule a follow-up appointment in 3 months.   ]

## 2010-11-16 NOTE — Progress Notes (Signed)
Summary: REQ FOR REFILL ON MED  Phone Note Call from Patient   Caller: Patient [phone] Reason for Call: Refill Medication Summary of Call: Pt called to adv that she needs to have 90-day refill RX on med (Metoprolol Tartrate 100 Mg Tabs (Metoprolol Tartrate) sent in to [facility]..... Pt req to be contacted on her cell # [phone] when same has been taken care of. Initial call taken by: Debbra Riding,  October 08, 2009 11:27 AM    Prescriptions: METOPROLOL TARTRATE 100 MG TABS (METOPROLOL TARTRATE) Take one tablet by mouth twice a day  #180 x 4   Entered by:   Raechel Ache, RN   Authorized by:   Gordy Savers  MD   Signed by:   Raechel Ache, RN on 10/08/2009   Method used:   Faxed to ...       [facility]* (mail-order)             ,          Ph: [phone]       Fax: [phone]   RxID:   [id number]

## 2010-11-16 NOTE — Medication Information (Signed)
Summary: Rx for Pantoprazole/[facility]  Rx for Pantoprazole/[facility]   Imported By: Maryln Gottron 05/28/2008 13:36:37  _____________________________________________________________________  External Attachment:    Type:   Image     Comment:   External Document

## 2010-11-16 NOTE — Procedures (Signed)
Summary: Colonoscopy   Colonoscopy  Procedure date:  04/30/2008  Findings:      Location:  [facility].    Procedures Next Due Date:    Colonoscopy: 04/2013  Patient Name: Tracy [name], Tracy [name] MRN:  Procedure Procedures: Colonoscopy CPT: 16109.    with polypectomy. CPT: A3573898.  Personnel: Endoscopist: Wilhemina Bonito. Marina Goodell, MD.  Referred By: Gordy Savers, MD.  Exam Location: Exam performed in Outpatient Clinic. Outpatient  Patient Consent: Procedure, Alternatives, Risks and Benefits discussed, consent obtained, from patient. Consent was obtained by the RN.  Indications Symptoms: Constipation Hematochezia.  Average Risk Screening Routine.  History  Current Medications: Patient is not currently taking Coumadin.  Pre-Exam Physical: Performed Apr 30, 2008. Cardio-pulmonary exam, Rectal exam, HEENT exam , Abdominal exam, Mental status exam WNL.  Comments: Pt. history reviewed/updated, physical exam performed prior to initiation of sedation?YES Exam Exam: Extent of exam reached: Cecum, extent intended: Cecum.  The cecum was identified by appendiceal orifice and IC valve. Patient position: on left side. Time to Cecum: 00:03:53. Time for Withdrawl: 00:13:50. Colon retroflexion performed. Images taken. ASA Classification: III. Tolerance: excellent.  Monitoring: Pulse and BP monitoring, Oximetry used. Supplemental O2 given.  Colon Prep Used MOVI PREP for colon prep. Prep results: excellent.  Sedation Meds: Patient assessed and found to be appropriate for moderate (conscious) sedation. Fentanyl 100 mcg. given IV. Versed 12 given IV.  Findings POLYP: Transverse Colon, Maximum size: 6 mm. Procedure:  snare without cautery, removed, retrieved, Polyp sent to pathology. ICD9: Colon Polyps: 211.3.  NORMAL EXAM: Cecum.  - DIVERTICULOSIS: Ascending Colon to Sigmoid Colon. ICD9: Diverticulosis, Colon: 562.10. Comments: MARKED CHANGES W/ DEEP FOLDS, ESP.  SIGMOID.  HEMORRHOIDS: Internal. ICD9: Hemorrhoids, Internal: 455.0.   Assessment  Diagnoses: 455.0: Hemorrhoids, Internal.  562.10: Diverticulosis, Colon. MARKED.  211.3: Colon Polyps. SESSILE.   Events  Unplanned Interventions: No intervention was required.  Unplanned Events: There were no complications. Plans Comments: GLYCOLAX AS NEEDED FOR CONSTIPATION Disposition: After procedure patient sent remain in endo suite for second procedure.  Scheduling/Referral: Colonoscopy, to Wilhemina Bonito. Marina Goodell, MD, IN 5 YEARS,    cc:  Meryl Dare, MD   REPORT OF SURGICAL PATHOLOGY   Case #: [id number] Patient Name: Tracy [name]. Office Chart Number:  N/A   MRN: [id number] Pathologist: Ferd Hibbs. Colonel Bald, MD DOB/Age  30/01/1946 (Age: 56)    Gender: M Date Taken:  04/30/2008 Date Received: 05/01/2008   FINAL DIAGNOSIS   ***MICROSCOPIC EXAMINATION AND DIAGNOSIS***   COLON, SIGMOID, POLYPS:   -  FRAGMENTS OF TUBULAR ADENOMAS. -  HIGH GRADE DYSPLASIA IS NOT IDENTIFIED.     kv Date Reported:  05/02/2008     Ferd Hibbs. Colonel Bald, MD *** Electronically Signed Out By JBK ***   Clinical information Screening. (mw)   specimen(s) obtained Colon, polyp(s), sigmoid   Gross Description Received in formalin is a 0.7 cm soft, tan mucosal polyp and a separate 0.4 cm tan mucosal fragment.  The polyp is inked and bisected. The specimen is entirely submitted in one cassette.   (GP:cdc 05/01/08)   cc/  Signed by Hilarie Fredrickson MD on 05/04/2008 at 11:09 PM  ________________________________________________________________________ RECALL COLONOSCOPY IN 3 YEARS   Signed by Hilarie Fredrickson MD on 05/04/2008 at 11:09 PM  ________________________________________________________________________    May 04, 2008 MRN: [id number]    ERCIE ELIAS[facility] [address] [place], [place]  [address]    Dear [name],  I am pleased to inform you that the colon polyp(s)  removed during your  recent colonoscopy was (were) found to be benign (no cancer detected) upon pathologic examination.  I recommend you have a repeat colonoscopy examination in 5 years to look for recurrent polyps, as having colon polyps increases your risk for having recurrent polyps or even colon cancer in the future.  Should you develop new or worsening symptoms of abdominal pain, bowel habit changes or bleeding from the rectum or bowels, please schedule an evaluation with either your primary care physician or with me.  Additional information/recommendations:  _X_ No further action with gastroenterology is needed at this time. Please      follow-up with your primary care physician for your other healthcare      needs.  Please call Tracy [name] if you are having persistent problems or have questions about your condition that have not been fully answered at this time.  Sincerely,  Hilarie Fredrickson MD  This letter has been electronically signed by your physician.   Signed by Hilarie Fredrickson MD on 05/04/2008 at 11:12 PM   This report was created from the original endoscopy report, which was reviewed and signed by the above listed endoscopist.    ________________________________________________________________________         ________________________________________________________________________

## 2010-11-16 NOTE — Assessment & Plan Note (Signed)
Summary: ?bladder inf/njr   Vital Signs:  Patient profile:   65 year old female Weight:      183 pounds Temp:     98.3 degrees F oral BP sitting:   130 / 80  (right arm) Cuff size:   regular  Vitals Entered By: Duard Brady LPN (July 02, 2010 8:48 AM) CC: c/o difficult urination Is Patient Diabetic? No Flu Vaccine Consent Questions     Do you have a history of severe allergic reactions to this vaccine? no    Any prior history of allergic reactions to egg and/or gelatin? no    Do you have a sensitivity to the preservative Thimersol? no    Do you have a past history of Guillan-Barre Syndrome? no    Do you currently have an acute febrile illness? no    Have you ever had a severe reaction to latex? no    Vaccine information given and explained to patient? yes    Are you currently pregnant? no    Lot Number:[id number]   Exp Date:04/16/2011   Site Given  Left Deltoid IM   Primary Care Provider:  Eleonore Chiquito, MD  CC:  c/o difficult urination.  History of Present Illness: 65 year old patient presents with a few day history of urgency and frequency, but without dysuria.  She was treated last month for a possible UTI with Cipro and seemed to improve.  Denies any fever, cloudy urine.  No nocturnal symptoms.  She does have treated hypertension, dyslipidemia, and coronary artery disease.  No history of overactive bladder  Allergies: 1)  ! Erythromycin 2)  Sulfamethoxazole (Sulfamethoxazole)  Past History:  Past Medical History: Headache Hypertension Myocardial infarction, hx of High Cholesterol Menopausal Syndrome Allergic rhinitis Low back pain tobacco use overactive bladder  Review of Systems       left shoulder pain with movement  Physical Exam  General:  overweight-appearing.  130/80overweight-appearing.   Msk:  tenderness left posterior shoulder, increased pain with range of motion   Impression & Recommendations:  Problem # 1:  OVERACTIVE  BLADDER (ICD-596.51)  Problem # 2:  PURE HYPERCHOLESTEROLEMIA (ICD-272.0)  Her updated medication list for this problem includes:    Crestor 10 Mg Tabs (Rosuvastatin calcium) .Marland Kitchen... Take 1 tablet by mouth once a day    Zetia 10 Mg Tabs (Ezetimibe) .Marland Kitchen... Take 1 tablet by mouth once a day  Her updated medication list for this problem includes:    Crestor 10 Mg Tabs (Rosuvastatin calcium) .Marland Kitchen... Take 1 tablet by mouth once a day    Zetia 10 Mg Tabs (Ezetimibe) .Marland Kitchen... Take 1 tablet by mouth once a day  Complete Medication List: 1)  Crestor 10 Mg Tabs (Rosuvastatin calcium) .... Take 1 tablet by mouth once a day 2)  Nitroglycerin 0.4 Mg Subl (Nitroglycerin) .Marland Kitchen.. 1 tablet under tongue as directed 3)  Plavix 75 Mg Tabs (Clopidogrel bisulfate) .... Take 1 tablet by mouth once a day 4)  Zetia 10 Mg Tabs (Ezetimibe) .... Take 1 tablet by mouth once a day 5)  Alprazolam 0.25 Mg Tabs (Alprazolam) .Marland Kitchen.. 1 two times a day as needed 6)  Adult Aspirin Low Strength 81 Mg Tbdp (Aspirin) .Marland Kitchen.. 1 once daily 7)  Zyrtec Allergy 10 Mg Tabs (Cetirizine hcl) .Marland Kitchen.. 1 once daily 8)  Pantoprazole Sodium 40 Mg Tbec (Pantoprazole sodium) .Marland Kitchen.. 1 each day 30 minutes before meal 9)  Metoprolol Tartrate 100 Mg Tabs (metoprolol Tartrate)  .... Take one tablet by mouth twice a day 10)  Hydrocodone-acetaminophen 5-500 Mg Tabs (Hydrocodone-acetaminophen) .... As needed 11)  Vitamin C 1.000mg  Tablet  .... Take 1 tablet by mouth once a day 12)  Calcium Plus Viitamin D 1.000mg  Tablet  .... Take 1 tablet by mouth once a day 13)  Fish Oil 1200 Mg Caps (Omega-3 fatty acids) .... Once daily 14)  Lisinopril-hydrochlorothiazide 20-12.5 Mg Tabs (Lisinopril-hydrochlorothiazide) .... One daily 15)  Vesicare 5 Mg Tabs (Solifenacin succinate) .... One daily, as needed  Other Orders: Admin 1st Vaccine (16109) Flu Vaccine 22yrs + (60454) UA Dipstick w/o Micro (manual) (09811)  Patient Instructions: 1)  avoid the excessive caffeine use 2)   return as scheduled for routine follow-up  Laboratory Results   Urine Tests  Date/Time Received: July 02, 2010 9:18 AM  Date/Time Reported: July 02, 2010 9:19 AM   Routine Urinalysis   Color: yellow Appearance: Hazy Glucose: negative   (Normal Range: Negative) Bilirubin: negative   (Normal Range: Negative) Ketone: negative   (Normal Range: Negative) Spec. Gravity: >=1.030   (Normal Range: 1.003-1.035) Blood: small   (Normal Range: Negative) pH: 5.0   (Normal Range: 5.0-8.0) Protein: trace   (Normal Range: Negative) Urobilinogen: 0.2   (Normal Range: 0-1) Nitrite: negative   (Normal Range: Negative) Leukocyte Esterace: negative   (Normal Range: Negative)

## 2010-11-16 NOTE — Progress Notes (Signed)
Summary: need rx done thru [facility]  Phone Note Call from Patient Call back at Home Phone [phone]   Caller: Patient Call For: Tracy [name] Summary of Call: [facility] ...they will send paperwork today ..([facility]) plz call pt when you receive the fax.... PT STATES THAT [facility] RECEIVED A FAX FORM [place] TO FILL RX BUT SHE NEEDS IT FOR A 90 DAYS SUPPLY  PT WOULD LIKE TO SPEAK TO NURSE Initial call taken by: Tawni Levy,  May 02, 2008 12:30 PM  Follow-up for Phone Call        Rx Called In.  Left message for patient that rx was sent to [facility]. Follow-up by: Hortense Ramal CMA,  May 07, 2008 4:26 PM      Prescriptions: PANTOPRAZOLE SODIUM 40 MG  TBEC (PANTOPRAZOLE SODIUM) 1 each day 30 minutes before meal  #90 x 3   Entered by:   Hortense Ramal CMA   Authorized by:   Hilarie Fredrickson MD   Signed by:   Hortense Ramal CMA on 05/07/2008   Method used:   Printed then faxed to ...       [facility]             , [place]         Ph:        Fax: [phone]   RxID:   [id number]    [facility] says they never got first rx for pantoprazole.  Hortense Ramal CMA  May 07, 2008 4:28 PM

## 2010-11-16 NOTE — Assessment & Plan Note (Signed)
Summary: BACK PAIN//CCM   Vital Signs:  Patient profile:   65 year old female Weight:      179 pounds Temp:     98.6 degrees F oral BP sitting:   150 / 90  (left arm) Cuff size:   regular  Vitals Entered By: Sid Falcon LPN (February 05, 2009 9:35 AM) CC: Low back pain X 2 weeks, denies acute injury   History of Present Illness: Patient is seen with chief complaint of low back pain 2 weeks duration. Pain is mid lower back without radiation. No injury. Pain worse with movement such as getting out of chair. Described as mostly dull ache. At worst intensity 8/10. No radiculopathy symptoms. Job requires a lot of sitting. Has taken Advil with very minimal relief. Hydrocodone helps some. Muscle relaxers helped in the past. Denies any fever, chills, urinary symptoms or any weakness or numbness.  Patient also request prescription for Chantix. She has used in the past without side effect but did not finish the 3 month treatment. She desires to quit smoking at this time  Allergies: 1)  ! Erythromycin 2)  Sulfamethoxazole (Sulfamethoxazole)  Past History:  Past Medical History:    Headache    Hypertension    Myocardial infarction, hx of    High Cholesterol    Menopausal Syndrome    Allergic rhinitis    Low back pain     (05/30/2007)  Review of Systems      See HPI  Physical Exam  General:  Well-developed,well-nourished,in no acute distress; alert,appropriate and cooperative throughout examination Lungs:  Normal respiratory effort, chest expands symmetrically. Lungs are clear to auscultation, no crackles or wheezes. Heart:  Normal rate and regular rhythm. S1 and S2 normal without gallop, murmur, click, rub or other extra sounds. Msk:  patient has pain with flexion of the back to 70 and with about 15 of extension. Minimal tenderness lower lumbar area diffusely. Extremities:  No clubbing, cyanosis, edema, or deformity noted with normal full range of motion of all joints.     Neurologic:  straight leg raises are negative bilaterally. She has full strength in lower extremities. Deep tender reflexes are 1+ knee and ankle bilaterally   Impression & Recommendations:  Problem # 1:  BACK PAIN, LUMBAR (ICD-724.2) Reviewed extension stretches. Prescribe muscle relaxer. Continue p.r.n. use of Advil and hydrocodone. F/U if pain not resolved  in 4 weeks Her updated medication list for this problem includes:    Adult Aspirin Low Strength 81 Mg Tbdp (Aspirin) .Marland Kitchen... 1 once daily    Hydrocodone-acetaminophen 5-500 Mg Tabs (Hydrocodone-acetaminophen) .Marland Kitchen... As needed    Skelaxin 800 Mg Tabs (Metaxalone) ..... One by mouth q 8 hours as needed  Complete Medication List: 1)  Crestor 10 Mg Tabs (Rosuvastatin calcium) .... Take 1 tablet by mouth once a day 2)  Lisinopril 5 Mg Tabs (Lisinopril) .... Take 1 tablet by mouth once a day 3)  Nitroglycerin 0.4 Mg Subl (Nitroglycerin) .Marland Kitchen.. 1 tablet under tongue as directed 4)  Plavix 75 Mg Tabs (Clopidogrel bisulfate) .... Take 1 tablet by mouth once a day 5)  Zetia 10 Mg Tabs (Ezetimibe) .... Take 1 tablet by mouth once a day 6)  Alprazolam 0.25 Mg Tabs (Alprazolam) .Marland Kitchen.. 1 two times a day as needed 7)  Adult Aspirin Low Strength 81 Mg Tbdp (Aspirin) .Marland Kitchen.. 1 once daily 8)  Zyrtec Allergy 10 Mg Tabs (Cetirizine hcl) .Marland Kitchen.. 1 once daily 9)  Pantoprazole Sodium 40 Mg Tbec (Pantoprazole sodium) .Marland KitchenMarland KitchenMarland Kitchen 1  each day 30 minutes before meal 10)  Metoprolol Tartrate 50 Mg Tabs (Metoprolol tartrate) .... Take one tablet by mouth twice a day 11)  Hydrocodone-acetaminophen 5-500 Mg Tabs (Hydrocodone-acetaminophen) .... As needed 12)  Vitamin C 1.000mg  Tablet  .... Take 1 tablet by mouth once a day 13)  Calcium Plus Viitamin D 1.000mg  Tablet  .... Take 1 tablet by mouth once a day 14)  Fish Oil 1200 Mg Caps (Omega-3 fatty acids) .... Once daily 15)  Chantix Starting Month Pak 0.5 Mg X 11 & 1 Mg X 42 Tabs (Varenicline tartrate) .... As instructed 16)   Skelaxin 800 Mg Tabs (Metaxalone) .... One by mouth q 8 hours as needed 17)  Chantix Starting Month Pak 0.5 Mg X 11 & 1 Mg X 42 Tabs (Varenicline tartrate) .... Use as directed 18)  Chantix Continuing Month Pak 1 Mg Tabs (Varenicline tartrate) .... Use as directed  Other Orders: Tobacco use cessation intermediate 3-10 minutes (40102)  Patient Instructions: 1)  Follow up with your primary physician if your back pain is now fully resolved in next 3-4 weeks. Do stretching exercises as instructed. Prescriptions: CHANTIX CONTINUING MONTH PAK 1 MG TABS (VARENICLINE TARTRATE) use as directed  #1 x 1   Entered and Authorized by:   Evelena Peat MD   Signed by:   Evelena Peat MD on 02/05/2009   Method used:   [facility]ronically to        [facility] [phone]* (retail)       [address]       [place], [place]  [address]       Ph: [phone] or [phone]       Fax: [phone]   RxID:   [id number] CHANTIX STARTING MONTH PAK 0.5 MG X 11 & 1 MG X 42 TABS (VARENICLINE TARTRATE) use as directed  #1 x 1   Entered and Authorized by:   Evelena Peat MD   Signed by:   Evelena Peat MD on 02/05/2009   Method used:   [facility]        [phone]* (retail)       [address]       [place], [place]  [address]       Ph: [phone] or [phone]       Fax: [phone]   RxID:   [id number] SKELAXIN 800 MG TABS (METAXALONE) one by mouth q 8 hours as needed  #40 x 0   Entered and Authorized by:   Evelena Peat MD   Signed by:   Evelena Peat MD on 02/05/2009   Method used:   [facility]        [facility] [phone]* (retail)       [address]       [place], [place]  [address]       Ph: [phone] or [phone]       Fax: [phone]   RxID:   [id number]

## 2010-11-16 NOTE — Assessment & Plan Note (Signed)
Summary: BACK AND JOINT PAIN/CCM   Vital Signs:  Patient Profile:   65 Years Old Female Weight:      170 pounds Temp:     98.3 degrees F oral BP sitting:   126 / 80  (left arm)  Vitals Entered By: Raechel Ache, RN (September 27, 2007 4:18 PM)                 Chief Complaint:  C/o LBP and hands and legs hurting.Tracy [name]  History of Present Illness: 65 year old female, history of hypertension, dyslipidemia, and osteoarthritis.  She presents complaining of increasingly severe low back pain, as well as pain of all the small joints of her hands  Current Allergies: ! ERYTHROMYCIN SULFAMETHOXAZOLE (SULFAMETHOXAZOLE)   Family History:    adopted     Physical Exam  General:     Well-developed,well-nourished,in no acute distress; alert,appropriate and cooperative throughout examination Neck:     No deformities, masses, or tenderness noted. Lungs:     Normal respiratory effort, chest expands symmetrically. Lungs are clear to auscultation, no crackles or wheezes. Heart:     Normal rate and regular rhythm. S1 and S2 normal without gallop, murmur, click, rub or other extra sounds. Msk:     osteoarthritic changes of the small joints of the hands noted; she has some slight tenderness in tight tense muscles involving her right lumbar region; straight leg testing was negative    Impression & Recommendations:  Problem # 1:  LOW BACK PAIN (ICD-724.2)  Her updated medication list for this problem includes:    Vicodin 5-500 Mg Tabs (Hydrocodone-acetaminophen) .Tracy [name]... 1 two times a day as needed    Adult Aspirin Low Strength 81 Mg Tbdp (Aspirin) .Tracy [name]... 1 once daily    Diclofenac Sodium 75 Mg Tbec (Diclofenac sodium) ..... One twice daily   Complete Medication List: 1)  Allegra-d 12 Hour 60-120 Mg Tb12 (Fexofenadine-pseudoephedrine) 2)  Crestor 10 Mg Tabs (Rosuvastatin calcium) .... Take 1 tablet by mouth once a day 3)  Lisinopril 5 Mg Tabs (Lisinopril) .... Take 1 tablet by mouth once a  day 4)  Metoprolol Succinate 25 Mg Tb24 (Metoprolol succinate) .... Take 1 tablet by mouth twice a day 5)  Nitroglycerin 0.4 Mg Subl (Nitroglycerin) .Tracy [name].. 1 tablet under tongue as directed 6)  Plavix 75 Mg Tabs (Clopidogrel bisulfate) .... Take 1 tablet by mouth once a day 7)  Zetia 10 Mg Tabs (Ezetimibe) .... Take 1 tablet by mouth once a day 8)  Vicodin 5-500 Mg Tabs (Hydrocodone-acetaminophen) .Tracy [name].. 1 two times a day as needed 9)  Alprazolam 0.25 Mg Tabs (Alprazolam) .Tracy [name].. 1 two times a day as needed 10)  Adult Aspirin Low Strength 81 Mg Tbdp (Aspirin) .Tracy [name].. 1 once daily 11)  Multi-vitamin Tabs (Multiple vitamin) 12)  Chantix 1 Mg Tabs (Varenicline tartrate) 13)  Flaxseed Oil 1000 Mg Caps (Flaxseed (linseed)) 14)  Bl Echinacea-goldenseal 112.5-25 Mg Tabs (Echinacea-goldenseal) 15)  Diclofenac Sodium 75 Mg Tbec (Diclofenac sodium) .... One twice daily   Patient Instructions: 1)  Most patients (90%) with low back pain will improve with time (2-6 weeks). Keep active but avoid activities that are painful. Apply moist heat and/or ice to lower back several times a day.    Prescriptions: DICLOFENAC SODIUM 75 MG  TBEC (DICLOFENAC SODIUM) one twice daily  #50 x 4   Entered and Authorized by:   Gordy Savers  MD   Signed by:   Gordy Savers  MD on 09/27/2007   Method  used:   Print then Give to Patient   RxID:   [id number] VICODIN 5-500 MG  TABS (HYDROCODONE-ACETAMINOPHEN) 1 two times a day as needed  #50 x 3   Entered and Authorized by:   Gordy Savers  MD   Signed by:   Gordy Savers  MD on 09/27/2007   Method used:   Print then Give to Patient   RxID:   [id number]  ]

## 2010-11-16 NOTE — Progress Notes (Signed)
Summary: refill hydrocodone-acetamin  Phone Note Refill Request Message from:  Fax from Pharmacy on June 07, 2010 12:01 PM  Refills Requested: Medication #1:  HYDROCODONE-ACETAMINOPHEN 5-500 MG TABS as needed   [facility]: [facility]   [phone]   Method Requested: Telephone to Pharmacy Initial call taken by: Duard Brady LPN,  June 07, 2010 12:01 PM    #50

## 2010-11-16 NOTE — Progress Notes (Signed)
Summary: [facility]  Phone Note From Pharmacy   Caller: [facility] Call For: DR Zaylynn Rickett  Details for Reason: DRUG INTERACTION Summary of Call: [phone] REFERENCE [phone].  Initial call taken by: Leanor Kail [facility],  May 13, 2008 3:50 PM  Follow-up for Phone Call        received form with warning for drug interaction Plavix and Pantoprazole.  Gave to Dr. Marina Goodell to review and sign and I will fax back to [facility].  Milford Cage CMA  May 14, 2008 1:31 PM

## 2010-11-16 NOTE — Progress Notes (Signed)
Summary: refill hydrocondone-acetaminophen  Phone Note Refill Request Message from:  Fax from Pharmacy on April 21, 2010 9:37 AM  Refills Requested: Medication #1:  HYDROCODONE-ACETAMINOPHEN 5-500 MG TABS as needed   Last Ref[facility]: [facility]/10/2010 [facility] [phone]     written 11/09/09   #50 RF2 1tab by mouth prn   Method Requested: Telephone to Pharmacy Initial call taken by: Duard Brady LPN,  April 22, 4539 9:39 AM  Follow-up for Phone Call        Vicodin ES dispense 20 tablets directions one daily p.r.n. severe pain......... no refills........ advise patient next time to call two weeks prior to being out of medication and he will need to sign a pain contract when he comes in Follow-up by: Roderick Pee MD,  April 21, 2010 9:57 AM    Prescriptions: HYDROCODONE-ACETAMINOPHEN 5-500 MG TABS (HYDROCODONE-ACETAMINOPHEN) as needed  #20 x 0   Entered by:   Duard Brady LPN   Authorized by:   Roderick Pee MD   Signed by:   Duard Brady LPN on 98/08/9146   Method used:   Historical   RxID:   [id number]

## 2010-11-16 NOTE — Medication Information (Signed)
Summary: RX Folder  RX Folder   Imported By: Kassie Mends 04/23/2008 08:41:00  _____________________________________________________________________  External Attachment:    Type:   Image     Comment:   refill

## 2010-11-16 NOTE — Miscellaneous (Signed)
Summary: protonix rx.  Clinical Lists Changes  Medications: Added new medication of PANTOPRAZOLE SODIUM 40 MG  TBEC (PANTOPRAZOLE SODIUM) 1 each day 30 minutes before meal - Signed Rx of PANTOPRAZOLE SODIUM 40 MG  TBEC (PANTOPRAZOLE SODIUM) 1 each day 30 minutes before meal;  #30 x 11;  Signed;  Entered by: Darlyn Read RN;  Authorized by: Hilarie Fredrickson MD;  Method used: Electronic    Prescriptions: PANTOPRAZOLE SODIUM 40 MG  TBEC (PANTOPRAZOLE SODIUM) 1 each day 30 minutes before meal  #30 x 11   Entered by:   Darlyn Read RN   Authorized by:   Hilarie Fredrickson MD   Signed by:   Darlyn Read RN on 04/30/2008   Method used:   [facility]ron[facility] to ...       [id number]       [address]       [place], [place]  [address]       Ph: [phone] or [phone]       Fax: [phone]   RxID:   [id number]

## 2010-11-16 NOTE — Assessment & Plan Note (Signed)
Summary: 1 MONTH ROV/NJR   Vital Signs:  Patient profile:   65 year old female Weight:      179 pounds Temp:     97.8 degrees F oral BP sitting:   128 / 88  (left arm) Cuff size:   regular  Vitals Entered By: Raechel Ache, RN (May 06, 2009 8:28 AM)  Primary Care Provider:  Eleonore Chiquito, MD  CC:  1 mo ROV.Marland Kitchen  History of Present Illness: 65 year old patient who is seen today for follow-up.  She was placed on lisinopril, HCTZ combination one month ago due to pedal edema and slightly high blood pressure.  She has done well on this regimen and has had no recurrent swelling.  Her only complaint today is some occasional chest congestion.  She continues to smoke sporadically.  Allergies: 1)  ! Erythromycin 2)  Sulfamethoxazole (Sulfamethoxazole)  Past History:  Past Medical History: Headache Hypertension Myocardial infarction, hx of High Cholesterol Menopausal Syndrome Allergic rhinitis Low back pain tobacco use  Physical Exam  General:  overweight-appearing.  118/80overweight-appearing.   Mouth:  Oral mucosa and oropharynx without lesions or exudates.  Teeth in good repair. Neck:  No deformities, masses, or tenderness noted. Lungs:  Normal respiratory effort, chest expands symmetrically. Lungs are clear to auscultation, no crackles or wheezes. Heart:  Normal rate and regular rhythm. S1 and S2 normal without gallop, murmur, click, rub or other extra sounds. Extremities:  no edema   Impression & Recommendations:  Problem # 1:  HYPERTENSION (ICD-401.9)  Her updated medication list for this problem includes:    Metoprolol Tartrate 50 Mg Tabs (Metoprolol tartrate) .Marland Kitchen... Take one tablet by mouth twice a day    Lisinopril-hydrochlorothiazide 20-12.5 Mg Tabs (Lisinopril-hydrochlorothiazide) ..... One daily improved;  will continue on her present regimen  Her updated medication list for this problem includes:    Metoprolol Tartrate 50 Mg Tabs (Metoprolol tartrate) .Marland Kitchen...  Take one tablet by mouth twice a day    Lisinopril-hydrochlorothiazide 20-12.5 Mg Tabs (Lisinopril-hydrochlorothiazide) ..... One daily  Problem # 2:  MYOCARDIAL INFARCTION, HX OF (ICD-412)  Her updated medication list for this problem includes:    Nitroglycerin 0.4 Mg Subl (Nitroglycerin) .Marland Kitchen... 1 tablet under tongue as directed    Plavix 75 Mg Tabs (Clopidogrel bisulfate) .Marland Kitchen... Take 1 tablet by mouth once a day    Adult Aspirin Low Strength 81 Mg Tbdp (Aspirin) .Marland Kitchen... 1 once daily    Metoprolol Tartrate 50 Mg Tabs (Metoprolol tartrate) .Marland Kitchen... Take one tablet by mouth twice a day    Lisinopril-hydrochlorothiazide 20-12.5 Mg Tabs (Lisinopril-hydrochlorothiazide) ..... One daily  Her updated medication list for this problem includes:    Nitroglycerin 0.4 Mg Subl (Nitroglycerin) .Marland Kitchen... 1 tablet under tongue as directed    Plavix 75 Mg Tabs (Clopidogrel bisulfate) .Marland Kitchen... Take 1 tablet by mouth once a day    Adult Aspirin Low Strength 81 Mg Tbdp (Aspirin) .Marland Kitchen... 1 once daily    Metoprolol Tartrate 50 Mg Tabs (Metoprolol tartrate) .Marland Kitchen... Take one tablet by mouth twice a day    Lisinopril-hydrochlorothiazide 20-12.5 Mg Tabs (Lisinopril-hydrochlorothiazide) ..... One daily  Complete Medication List: 1)  Crestor 10 Mg Tabs (Rosuvastatin calcium) .... Take 1 tablet by mouth once a day 2)  Nitroglycerin 0.4 Mg Subl (Nitroglycerin) .Marland Kitchen.. 1 tablet under tongue as directed 3)  Plavix 75 Mg Tabs (Clopidogrel bisulfate) .... Take 1 tablet by mouth once a day 4)  Zetia 10 Mg Tabs (Ezetimibe) .... Take 1 tablet by mouth once a day 5)  Alprazolam 0.25 Mg Tabs (Alprazolam) .Marland Kitchen.. 1 two times a day as needed 6)  Adult Aspirin Low Strength 81 Mg Tbdp (Aspirin) .Marland Kitchen.. 1 once daily 7)  Zyrtec Allergy 10 Mg Tabs (Cetirizine hcl) .Marland Kitchen.. 1 once daily 8)  Pantoprazole Sodium 40 Mg Tbec (Pantoprazole sodium) .Marland Kitchen.. 1 each day 30 minutes before meal 9)  Metoprolol Tartrate 50 Mg Tabs (Metoprolol tartrate) .... Take one tablet by  mouth twice a day 10)  Hydrocodone-acetaminophen 5-500 Mg Tabs (Hydrocodone-acetaminophen) .... As needed 11)  Vitamin C 1.000mg  Tablet  .... Take 1 tablet by mouth once a day 12)  Calcium Plus Viitamin D 1.000mg  Tablet  .... Take 1 tablet by mouth once a day 13)  Fish Oil 1200 Mg Caps (Omega-3 fatty acids) .... Once daily 14)  Chantix Starting Month Pak 0.5 Mg X 11 & 1 Mg X 42 Tabs (Varenicline tartrate) .... As instructed 15)  Skelaxin 800 Mg Tabs (Metaxalone) .... One by mouth q 8 hours as needed 16)  Chantix Starting Month Pak 0.5 Mg X 11 & 1 Mg X 42 Tabs (Varenicline tartrate) .... Use as directed 17)  Chantix Continuing Month Pak 1 Mg Tabs (Varenicline tartrate) .... Use as directed 18)  Lisinopril-hydrochlorothiazide 20-12.5 Mg Tabs (Lisinopril-hydrochlorothiazide) .... One daily  Patient Instructions: 1)  Please schedule a follow-up appointment in 3 months. 2)  Limit your Sodium (Salt). 3)  It is important that you exercise regularly at least 20 minutes 5 times a week. If you develop chest pain, have severe difficulty breathing, or feel very tired , stop exercising immediately and seek medical attention. 4)  You need to lose weight. Consider a lower calorie diet and regular exercise.  5)  Check your Blood Pressure regularly. If it is above:  140/90 you should make an appointment. Prescriptions: HYDROCODONE-ACETAMINOPHEN 5-500 MG TABS (HYDROCODONE-ACETAMINOPHEN) as needed  #50 x 2   Entered and Authorized by:   Gordy Savers  MD   Signed by:   Gordy Savers  MD on 05/06/2009   Method used:   Print then Give to Patient   RxID:   [id number]

## 2010-11-16 NOTE — Assessment & Plan Note (Signed)
Summary: ankles swelling/painful/cjr   Vital Signs:  Patient profile:   65 year old female Weight:      182 pounds BMI:     29.48 O2 Sat:      96 % on Room air Temp:     98.6 degrees F oral Pulse rate:   88 / minute Pulse rhythm:   regular BP sitting:   98 / 82  (left arm) Cuff size:   regular  Vitals Entered By: Raechel Ache, RN (July 01, 2009 11:03 AM)  O2 Flow:  Room air CC: C/o SOB and swelling.   Primary Care Provider:  Eleonore Chiquito, MD  CC:  C/o SOB and swelling.Marland Kitchen  History of Present Illness: 65 year old patient who is seen today for follow-up.  She has a history of coronary artery disease and is followed by cardiology.  She  discontinued  smoking about one month ago.  she has noticed some mild dyspnea on exertion, but no exertional chest pain.  She has treated hypertension, and dyslipidemia.  She states that she notes occasional ankle edema towards the end of the day.  Denies any PND or orthopnea  Preventive Screening-Counseling & Management  Alcohol-Tobacco     Smoking Status: quit < 6 months  Allergies: 1)  ! Erythromycin 2)  Sulfamethoxazole (Sulfamethoxazole)  Past History:  Past Medical History: Reviewed history from 05/06/2009 and no changes required. Headache Hypertension Myocardial infarction, hx of High Cholesterol Menopausal Syndrome Allergic rhinitis Low back pain tobacco use  Past Surgical History: Tonsillectomy sigmoidoscopy August 2003 Angioplasty/stent- 2007 colonoscopy July 2009  Social History: Smoking Status:  quit < 6 months  Review of Systems       The patient complains of dyspnea on exertion.  The patient denies anorexia, fever, weight loss, weight gain, vision loss, decreased hearing, hoarseness, chest pain, syncope, peripheral edema, prolonged cough, headaches, hemoptysis, abdominal pain, melena, hematochezia, severe indigestion/heartburn, hematuria, incontinence, genital sores, muscle weakness, suspicious skin  lesions, transient blindness, difficulty walking, depression, unusual weight change, abnormal bleeding, enlarged lymph nodes, angioedema, and breast masses.    Physical Exam  General:  overweight-appearing.  110/70 ( lower on arrival)overweight-appearing.   Eyes:  No corneal or conjunctival inflammation noted. EOMI. Perrla. Funduscopic exam benign, without hemorrhages, exudates or papilledema. Vision grossly normal. Mouth:  Oral mucosa and oropharynx without lesions or exudates.  Teeth in good repair. Neck:  No deformities, masses, or tenderness noted. Lungs:  Normal respiratory effort, chest expands symmetrically. Lungs are clear to auscultation, no crackles or wheezes. Heart:  Normal rate and regular rhythm. S1 and S2 normal without gallop, murmur, click, rub or other extra sounds. Msk:  No deformity or scoliosis noted of thoracic or lumbar spine.   Pulses:   pulses full Extremities:  no clinical edema    Impression & Recommendations:  Problem # 1:  MYOCARDIAL INFARCTION, HX OF (ICD-412)  Her updated medication list for this problem includes:    Nitroglycerin 0.4 Mg Subl (Nitroglycerin) .Marland Kitchen... 1 tablet under tongue as directed    Plavix 75 Mg Tabs (Clopidogrel bisulfate) .Marland Kitchen... Take 1 tablet by mouth once a day    Adult Aspirin Low Strength 81 Mg Tbdp (Aspirin) .Marland Kitchen... 1 once daily    Metoprolol Tartrate 50 Mg Tabs (Metoprolol tartrate) .Marland Kitchen... Take one tablet by mouth twice a day    Lisinopril-hydrochlorothiazide 20-12.5 Mg Tabs (Lisinopril-hydrochlorothiazide) ..... One daily    Her updated medication list for this problem includes:    Nitroglycerin 0.4 Mg Subl (Nitroglycerin) .Marland Kitchen... 1 tablet  under tongue as directed    Plavix 75 Mg Tabs (Clopidogrel bisulfate) .Marland Kitchen... Take 1 tablet by mouth once a day    Adult Aspirin Low Strength 81 Mg Tbdp (Aspirin) .Marland Kitchen... 1 once daily    Metoprolol Tartrate 50 Mg Tabs (Metoprolol tartrate) .Marland Kitchen... Take one tablet by mouth twice a day     Lisinopril-hydrochlorothiazide 20-12.5 Mg Tabs (Lisinopril-hydrochlorothiazide) ..... One daily  Orders: Venipuncture (16109) TLB-Lipid Panel (80061-LIPID) TLB-BMP (Basic Metabolic Panel-BMET) (80048-METABOL) TLB-CBC Platelet - w/Differential (85025-CBCD) TLB-Hepatic/Liver Function Pnl (80076-HEPATIC) TLB-TSH (Thyroid Stimulating Hormone) (84443-TSH)  Problem # 2:  HYPERTENSION (ICD-401.9)  Her updated medication list for this problem includes:    Metoprolol Tartrate 50 Mg Tabs (Metoprolol tartrate) .Marland Kitchen... Take one tablet by mouth twice a day    Lisinopril-hydrochlorothiazide 20-12.5 Mg Tabs (Lisinopril-hydrochlorothiazide) ..... One daily    Her updated medication list for this problem includes:    Metoprolol Tartrate 50 Mg Tabs (Metoprolol tartrate) .Marland Kitchen... Take one tablet by mouth twice a day    Lisinopril-hydrochlorothiazide 20-12.5 Mg Tabs (Lisinopril-hydrochlorothiazide) ..... One daily  Orders: Venipuncture (60454) TLB-Lipid Panel (80061-LIPID) TLB-BMP (Basic Metabolic Panel-BMET) (80048-METABOL) TLB-CBC Platelet - w/Differential (85025-CBCD) TLB-Hepatic/Liver Function Pnl (80076-HEPATIC) TLB-TSH (Thyroid Stimulating Hormone) (84443-TSH)  Complete Medication List: 1)  Crestor 10 Mg Tabs (Rosuvastatin calcium) .... Take 1 tablet by mouth once a day 2)  Nitroglycerin 0.4 Mg Subl (Nitroglycerin) .Marland Kitchen.. 1 tablet under tongue as directed 3)  Plavix 75 Mg Tabs (Clopidogrel bisulfate) .... Take 1 tablet by mouth once a day 4)  Zetia 10 Mg Tabs (Ezetimibe) .... Take 1 tablet by mouth once a day 5)  Alprazolam 0.25 Mg Tabs (Alprazolam) .Marland Kitchen.. 1 two times a day as needed 6)  Adult Aspirin Low Strength 81 Mg Tbdp (Aspirin) .Marland Kitchen.. 1 once daily 7)  Zyrtec Allergy 10 Mg Tabs (Cetirizine hcl) .Marland Kitchen.. 1 once daily 8)  Pantoprazole Sodium 40 Mg Tbec (Pantoprazole sodium) .Marland Kitchen.. 1 each day 30 minutes before meal 9)  Metoprolol Tartrate 50 Mg Tabs (Metoprolol tartrate) .... Take one tablet by mouth  twice a day 10)  Hydrocodone-acetaminophen 5-500 Mg Tabs (Hydrocodone-acetaminophen) .... As needed 11)  Vitamin C 1.000mg  Tablet  .... Take 1 tablet by mouth once a day 12)  Calcium Plus Viitamin D 1.000mg  Tablet  .... Take 1 tablet by mouth once a day 13)  Fish Oil 1200 Mg Caps (Omega-3 fatty acids) .... Once daily 14)  Chantix Starting Month Pak 0.5 Mg X 11 & 1 Mg X 42 Tabs (Varenicline tartrate) .... As instructed 15)  Skelaxin 800 Mg Tabs (Metaxalone) .... One by mouth q 8 hours as needed 16)  Chantix Starting Month Pak 0.5 Mg X 11 & 1 Mg X 42 Tabs (Varenicline tartrate) .... Use as directed 17)  Chantix Continuing Month Pak 1 Mg Tabs (Varenicline tartrate) .... Use as directed 18)  Lisinopril-hydrochlorothiazide 20-12.5 Mg Tabs (Lisinopril-hydrochlorothiazide) .... One daily  Patient Instructions: 1)  Please schedule a follow-up appointment in 6 months. 2)  Limit your Sodium (Salt). 3)  It is important that you exercise regularly at least 20 minutes 5 times a week. If you develop chest pain, have severe difficulty breathing, or feel very tired , stop exercising immediately and seek medical attention. 4)  You need to lose weight. Consider a lower calorie diet and regular exercise.

## 2010-11-16 NOTE — Assessment & Plan Note (Signed)
Summary: Tracy [name]   Visit Type:  Follow-up Primary Provider:  Eleonore Chiquito, MD  CC:  slight discomfort.  History of Present Illness: Patient has been exercising and not smoking.   No chest discomfort.  Has gained some weight.  Eating late at night.  Has some parasternal pain, tender to touch at chostochrondal joint.  Current Medications (verified): 1)  Crestor 10 Mg Tabs (Rosuvastatin Calcium) .... Take 1 Tablet By Mouth Once A Day 2)  Nitroglycerin 0.4 Mg Subl (Nitroglycerin) .Marland Kitchen.. 1 Tablet Under Tongue As Directed 3)  Plavix 75 Mg Tabs (Clopidogrel Bisulfate) .... Take 1 Tablet By Mouth Once A Day 4)  Zetia 10 Mg Tabs (Ezetimibe) .... Take 1 Tablet By Mouth Once A Day 5)  Alprazolam 0.25 Mg  Tabs (Alprazolam) .Marland Kitchen.. 1 Two Times A Day As Needed 6)  Adult Aspirin Low Strength 81 Mg  Tbdp (Aspirin) .Marland Kitchen.. 1 Once Daily 7)  Zyrtec Allergy 10 Mg  Tabs (Cetirizine Hcl) .Marland Kitchen.. 1 Once Daily 8)  Pantoprazole Sodium 40 Mg  Tbec (Pantoprazole Sodium) .Marland Kitchen.. 1 Each Day 30 Minutes Before Meal 9)  Metoprolol Tartrate 100 Mg Tabs (Metoprolol Tartrate) .... Take One Tablet By Mouth Twice A Day 10)  Hydrocodone-Acetaminophen 5-500 Mg Tabs (Hydrocodone-Acetaminophen) .... As Needed 11)  Vitamin C 1.000mg  Tablet .... Take 1 Tablet By Mouth Once A Day 12)  Calcium Plus Viitamin D 1.000mg  Tablet .... Take 1 Tablet By Mouth Once A Day 13)  Fish Oil 1200 Mg Caps (Omega-3 Fatty Acids) .... Once Daily 14)  Skelaxin 800 Mg Tabs (Metaxalone) .... One By Mouth Q 8 Hours As Needed 15)  Lisinopril-Hydrochlorothiazide 20-12.5 Mg Tabs (Lisinopril-Hydrochlorothiazide) .... One Daily  Allergies (verified): 1)  ! Erythromycin 2)  Sulfamethoxazole (Sulfamethoxazole)  Past History:  Past Medical History: Last updated: 05/06/2009 Headache Hypertension Myocardial infarction, hx of High Cholesterol Menopausal Syndrome Allergic rhinitis Low back pain tobacco use  Vital Signs:  Patient profile:   65 year old  female Height:      66 inches Weight:      188 pounds Pulse rate:   76 / minute BP sitting:   107 / 73  (left arm) Cuff size:   large  Vitals Entered By: Burnett Kanaris, CNA (August 18, 2009 8:55 AM)  Physical Exam  General:  Well developed, well nourished, in no acute distress. Head:  normocephalic and atraumatic Neck:  Neck supple, no JVD. No masses, thyromegaly or abnormal cervical nodes. Chest Wall:  Slight tenderness L 2nd intercostal junction. Lungs:  Clear bilaterally to auscultation and percussion. Heart:  PMI non displaced.  No audible murmur noted.  Normal S1 and S2. Abdomen:  Bowel sounds positive; abdomen soft and non-tender without masses, organomegaly, or hernias noted. No hepatosplenomegaly. Extremities:  No clubbing or cyanosis.  No current edema. Neurologic:  Alert and oriented x 3.   EKG  Procedure date:  08/18/2009  Findings:      NSR. WNL.  Echocardiogram  Procedure date:  08/11/2009  Findings:       Study Conclusions            - Left ventricle: The cavity size was normal. Wall thickness was       increased in a pattern of mild LVH. Systolic function was normal.       The estimated ejection fraction was in the range of 50% to 55%.       There is hypokinesis of the posterior wall. Doppler parameters are  consistent with abnormal left ventricular relaxation (grade 1       diastolic dysfunction).     - Mitral valve: Calcified annulus. Mild regurgitation.     - Pulmonary arteries: Systolic pressure was mildly increased.  Impression & Recommendations:  Problem # 1:  MYOCARDIAL INFARCTION, HX OF (ICD-412) Symptoms not cardiac.  Chest pain is MS in origin.  Better habits at present.  Recommend continued medical treatment. Remains on DAPT, question need.  She is not smoking presently. Her updated medication list for this problem includes:    Nitroglycerin 0.4 Mg Subl (Nitroglycerin) .Marland Kitchen... 1 tablet under tongue as directed    Plavix 75 Mg Tabs  (Clopidogrel bisulfate) .Marland Kitchen... Take 1 tablet by mouth once a day    Adult Aspirin Low Strength 81 Mg Tbdp (Aspirin) .Marland Kitchen... 1 once daily    Lisinopril-hydrochlorothiazide 20-12.5 Mg Tabs (Lisinopril-hydrochlorothiazide) ..... One daily  Orders: EKG w/ Interpretation (93000)  Problem # 2:  COSTOCHONDRITIS (ICD-733.6) on exam  Problem # 3:  PURE HYPERCHOLESTEROLEMIA (ICD-272.0) Near target.  Discussed role of Zetia.  ? need.  Elev trig low HDL prob driven by weight and dietary.  Role discussed.   Her updated medication list for this problem includes:    Crestor 10 Mg Tabs (Rosuvastatin calcium) .Marland Kitchen... Take 1 tablet by mouth once a day    Zetia 10 Mg Tabs (Ezetimibe) .Marland Kitchen... Take 1 tablet by mouth once a day  Problem # 4:  HYPERTENSION (ICD-401.9) Followed by Dr. Kirtland Bouchard.  He is managing. Her updated medication list for this problem includes:    Adult Aspirin Low Strength 81 Mg Tbdp (Aspirin) .Marland Kitchen... 1 once daily    Lisinopril-hydrochlorothiazide 20-12.5 Mg Tabs (Lisinopril-hydrochlorothiazide) ..... One daily  Orders: EKG w/ Interpretation (93000)  Patient Instructions: 1)  Your physician recommends that you continue on your current medications as directed. Please refer to the Current Medication list given to you today. 2)  Your physician wants you to follow-up in:  1 YEAR.  You will receive a reminder letter in the mail two months in advance. If you don't receive a letter, please call our office to schedule the follow-up appointment.

## 2010-11-18 NOTE — Progress Notes (Signed)
Summary: refill alpraolam and pain med  Phone Note Refill Request Message from:  Fax from Pharmacy on October 20, 2010 12:04 PM  Refills Requested: Medication #1:  ALPRAZOLAM 0.25 MG  TABS 1 two times a day as needed   Last Refilled: 09/05/2010  Medication #2:  HYDROCODONE-ACETAMINOPHEN 5-500 MG TABS as needed   Last Refilled: 09/10/2010 [facility]   Method Requested: Fax to [facility] Initial call taken by: Duard Brady LPN,  October 20, 2010 12:05 PM  Follow-up for Phone Call        #50 each  Follow-up by: Gordy Savers  MD,  October 20, 2010 12:33 PM    Prescriptions: HYDROCODONE-ACETAMINOPHEN 5-500 MG TABS (HYDROCODONE-ACETAMINOPHEN) as needed  #50 x 0   Entered by:   Duard Brady LPN   Authorized by:   Gordy Savers  MD   Signed by:   Duard Brady LPN on 04/54/0981   Method used:   Historical   RxID:   [id number] ALPRAZOLAM 0.25 MG  TABS (ALPRAZOLAM) 1 two times a day as needed  #50 x 0   Entered by:   Duard Brady LPN   Authorized by:   Gordy Savers  MD   Signed by:   Duard Brady LPN on 57/84/6962   Method used:   Historical   RxID:   [id number]  [facility]   KIK

## 2010-11-26 ENCOUNTER — Other Ambulatory Visit: Payer: Self-pay | Admitting: Internal Medicine

## 2010-11-26 DIAGNOSIS — I10 Essential (primary) hypertension: Secondary | ICD-10-CM

## 2010-11-30 ENCOUNTER — Other Ambulatory Visit: Payer: Self-pay | Admitting: Internal Medicine

## 2010-11-30 DIAGNOSIS — K219 Gastro-esophageal reflux disease without esophagitis: Secondary | ICD-10-CM

## 2010-11-30 DIAGNOSIS — E785 Hyperlipidemia, unspecified: Secondary | ICD-10-CM

## 2010-12-16 ENCOUNTER — Other Ambulatory Visit: Payer: Self-pay

## 2010-12-16 DIAGNOSIS — R52 Pain, unspecified: Secondary | ICD-10-CM

## 2010-12-16 MED ORDER — HYDROCODONE-ACETAMINOPHEN 5-500 MG PO TABS: 1.0000 | ORAL_TABLET | Freq: Every day | ORAL | Status: DC | PRN

## 2010-12-16 NOTE — Telephone Encounter (Signed)
Please advise last seen 06/2010  Last rx'd 10/20/2010 #50 no RF

## 2010-12-16 NOTE — Telephone Encounter (Signed)
Need to reored with corrected amt - faxed back to [facility]

## 2010-12-24 ENCOUNTER — Other Ambulatory Visit: Payer: Self-pay

## 2010-12-24 MED ORDER — ALPRAZOLAM 0.25 MG PO TABS: 0.2500 mg | ORAL_TABLET | Freq: Two times a day (BID) | ORAL | Status: DC | PRN

## 2010-12-24 NOTE — Telephone Encounter (Signed)
Faxed [facility] to [facility]

## 2011-02-15 ENCOUNTER — Other Ambulatory Visit: Payer: Self-pay

## 2011-02-15 DIAGNOSIS — R52 Pain, unspecified: Secondary | ICD-10-CM

## 2011-02-15 MED ORDER — HYDROCODONE-ACETAMINOPHEN 5-500 MG PO TABS: 1.0000 | ORAL_TABLET | Freq: Every day | ORAL | Status: DC | PRN

## 2011-02-15 NOTE — Telephone Encounter (Signed)
50

## 2011-02-15 NOTE — Telephone Encounter (Signed)
[facility] 

## 2011-02-15 NOTE — Telephone Encounter (Signed)
Refill request for vicodin 5-500 from [facility] Last seen 06/2010   Last written 12/16/10 #50 0RF Please advise

## 2011-03-01 NOTE — Assessment & Plan Note (Signed)
[facility]                            CARDIOLOGY OFFICE NOTE   CAITLYNN, JU                 MRN:          [id number]  DATE:05/31/2007                            DOB:          04-Jun-1946    Ms. Beechy is in for followup.  In general she has been relatively  stable.  She has had a little bit of fluttering but not frequent  symptoms.  Overall she seems to be getting along reasonably well.  She  has had perhaps a little bit of chest discomfort as well.  She underwent  repeat echocardiography a few days ago which revealed an ejection  fraction of 55% - 65% and only mild mitral regurgitation.   PHYSICAL:  Today the blood pressure is 115/70, the pulse is 87.  LUNGS:  Fields are clear.  I can barely appreciate an apical systolic murmur.   She seems to be doing well on her current medical regimen.  She is on a  single dose aspirin as well as Plavix.  Her mitral regurgitation does  not appear to be significant.  She has a non drug-eluting platform, so  it would not be unreasonable for her potentially to discontinue Plavix  in the near future.  We will continue to follow her up but I will see  her in followup in 6 months.     Arturo Morton. Riley Kill, MD, [facility]  Electronically Signed    TDS/MedQ  DD: 06/01/2007  DT: 06/01/2007  Job #: [id number]

## 2011-03-01 NOTE — Assessment & Plan Note (Signed)
[facility]                            CARDIOLOGY OFFICE NOTE   BERLYN, SAYLOR                 MRN:          [id number]  DATE:12/05/2007                            DOB:          1946/06/12    Tracy [name] is in for followup.  She started smoking again and then she  stopped again.  Now, she is currently not smoking all that much.  She  continues at times to remain tired.   CURRENT MEDICATIONS:  1. Plavix 75 mg daily.  2. Lisinopril 5 daily.  3. Multivitamin daily.  4. Zetia 10 mg daily.  5. Crestor 10 mg daily.  6. Aspirin 81 mg daily.  7. Metoprolol 50 mg p.o. b.i.d.  8. Diclofenac 75 mg p.r.n.  9. Echinacea and flaxseed oil.   PHYSICAL EXAMINATION:  GENERAL:  She is alert and oriented in no  distress.  VITAL SIGNS:  Blood pressure is 114/84, pulse is 96.  LUNGS:  Lung fields are clear.  CARDIAC:  Rhythm is regular.  There is a soft apical murmur and this is  compatible with known mitral regurgitation.  Her last echocardiogram was  done in the fall of this past year.  At that time, she had mild mitral  valve regurgitation.   ASSESSMENT/PLAN:  At the present time, she remains stable.  We will  continue to see her in followup.  Lipid profile will be obtained.  The  goal will be an LDL of 70 or better.     Arturo Morton. Riley Kill, MD, [facility]  Electronically Signed    TDS/MedQ  DD: 01/07/2008  DT: 01/07/2008  Job #: [id number]   cc:   Gordy Savers, MD

## 2011-03-01 NOTE — Assessment & Plan Note (Signed)
Tracy [name]                            CARDIOLOGY OFFICE NOTE   Tracy [name], Tracy [name]                 MRN:          [id number]  DATE:07/22/2008                            DOB:          Feb 02, 1946    Tracy [name] is in for a followup visit.  In general, she is stable.  She  has started smoking again.  She has had a very difficult time with this.  She has occasional mild discomfort in chest, but it has not been any  kind of persistent pattern.  She has had a recent lipid profile.  This  revealed a cholesterol of 134, LDL of 69, HDL of 32, and triglycerides  of 162.  The patient also underwent a followup 2-D echocardiogram.  This  revealed an ejection fraction of 45-50% with inferior and apical  hypokinesis.  Overall, left ventricular size was normal and overall  systolic function was felt to be normal.  There was mild mitral  regurgitation.  In reviewing her prior Cardiolite study, this is in the  territory of her prior myocardial infarctions, and she has had followup  cath demonstrating continued patency of her stent.   Her medications include Plavix 75 mg daily, lisinopril 5 mg daily,  multivitamin daily, Zetia 10 mg daily, Crestor 10 mg daily, aspirin 81  mg daily, metoprolol 50 mg p.o. b.i.d., calcium and vitamin D 120 mg  daily, vitamin C 1000 mg daily, flaxseed oil 1000 mg, pantoprazole 40 mg  daily, and fish oil daily.   On physical, she is alert and oriented.  The weight is 173, the blood  pressure is 123/73, the pulse is 84.  The lung fields are clear.  There  is a soft apical murmur compatible with mitral regurgitation.   Overall, the patient has remained stable from a clinical standpoint.  Her electrocardiogram today demonstrates normal sinus rhythm essentially  within normal limits.  She has had prior stenting of the circumflex  coronary artery.  Her echocardiogram is stable, and compatible with what  has been previously seen.  Her  previous ejection fraction was calculated  to be higher, which she has previous radionuclide imaging study with an  ejection fraction of 49% following her acute myocardial infarction  previously documented, and this documented a similar wall motion  abnormality in the territory of the findings on echocardiography, her  mitral regurgitation does not appear to progress.  Her lipids are well  controlled at the present time, I will continue her on the same medical  regimen.  I have encouraged her not to smoke.  Should she have any  problems, she is to contact us.  Followup visit will be recommended in 6 months.     Arturo Morton. Riley Kill, MD, [facility]  Electronically Signed    TDS/MedQ  DD: 07/22/2008  DT: 07/23/2008  Job #: [id number]

## 2011-03-04 NOTE — Discharge Summary (Signed)
NAME:  [name], Tracy [name] NO.:  [id number]   MEDICAL RECORD NO.:  [id number]          PATIENT TYPE:  INP   LOCATION:  2009                         FACILITY:  [facility]   PHYSICIAN:  Arturo Morton. Riley Kill, MD, FACCDATE OF BIRTH:  [birth year]   DATE OF ADMISSION:  01/16/2007  DATE OF DISCHARGE:  01/18/2007                               DISCHARGE SUMMARY   PROCEDURES:  1. Cardiac catheterization.  2. Coronary arteriogram.  3. Left ventriculogram.   PRIMARY DIAGNOSIS:  Chest pain, medical therapy for coronary artery  disease.   SECONDARY DIAGNOSES:  1. Non-ST segment elevation MI, May 2007, with bare metal stent to      circumflex.  2. Hypertension.  3. Hyperlipidemia.  4. History of abnormal LFTs secondary to Lipitor.  5. Osteoarthritis.  6. Allergic rhinitis.  7. Mild to moderate MR by transesophageal echocardiograms July 2007.  8. Remote history of tonsillectomy and adenoidectomy.  9. Remote history of tobacco use.   TIME OF DISCHARGE:  Thirty eight minutes.   HOSPITAL COURSE:  Ms. [name] is a 65 year old female with previously  diagnosed coronary artery disease.  In her last cath, which was in  October 2007, she had a patent stent and an occluded septal perforator  for which medical therapy was recommended.  Her EF was normal.  She  developed chest pain which had been intermittent for the week prior to  admission and was admitted for further evaluation and treatment.   She was anticoagulated and increased from 81 mg to full strength  aspirin.  She remained pain free.  A lipid profile was performed which  showed total cholesterol of 120, triglycerides 126, HDL 35, LDL 60.  She  is to continue the Zetia and Crestor.  A cardiac catheterization was  performed to define her anatomy on 01/17/2007.  It showed nonobstructive  coronary artery disease with approximately 30% in-stent restenosis, 30-  50% distal circumflex and 30-40% LAD as well as 40-50% diagonal.  There  were collaterals to an occluded septal perforator.  She had inferior  hypokinesis with an EF of approximately 50% and 1-2+ MR.  She had mildly  decreased end diastolic filling secondary to MR and increased left  ventricular end-diastolic pressure probably secondary to hypertension.  Medical therapy was recommended.   On 01/18/2007 [name] was ambulating without chest pain or shortness  of breath.  She was evaluated by Dr. Riley Kill, considered stable for  discharge with outpatient follow-up arranged.   DISCHARGE INSTRUCTIONS:  1. Her activity level is to be increased gradually.  She is to call      our office for any problems with the cath site.  She has a follow-      up appointment with Dr. Riley Kill on April 15 at 4:00 p.m.  She is to      follow up with Dr. Amador Cunas as needed.   DISCHARGE MEDICATIONS:  1. Aspirin 81 mg a day.  2. Zetia 10 mg a day.  3. Crestor 10 mg day.  4. Metoprolol 50 mg b.i.d.  5. Plavix 75 mg daily.  6. Lisinopril 5 mg a day.  7.  Multivitamin q. day  8. Prilosec OTC 20 mg daily.  9. Nitroglycerin sublingual p.r.n.  10.Zyrtec or Allegra 180 mg daily.  11.Xanax 0.25 mg p.r.n.  12.Vicodin p.r.n.  13.She is not to take Zyrtec-D or Allegra-D.      Theodore Demark, PA-C      Arturo Morton. Riley Kill, MD, [facility]  Electronically Signed    RB/MEDQ  D:  01/18/2007  T:  01/18/2007  Job:  [id number]   cc:   Gordy Savers, MD

## 2011-03-04 NOTE — Assessment & Plan Note (Signed)
[facility]                              CARDIOLOGY OFFICE NOTE   Tracy [name], Tracy [name]                 MRN:          [id number]  DATE:07/05/2006                            DOB:          Sep 19, 1946    [name] is in for followup.  She has had a slight discomfort in the  chest although she says this is the same kind of discomfort she has had  since the time she left the hospital.  It has not really gotten too  tremendously worse.  She has been tolerating the Crestor without much  difficulty.  We have been following this very closely as she has had mild  liver function abnormalities on the statins.   On her physical today, the weight is 153, blood pressure 112/74, pulse 76,  lung fields clear, and the cardiac rhythm is regular.  Extremities reveal no  edema.   There is no electrocardiogram done today.   We are going to go ahead at the present time and get a stress Cardiolite on  her.  This will be important to assess recurrent ischemia.  She had a non-  drug-eluting stent placed in the setting of acute coronary syndrome.  In  addition, she has asked to get a repeat lipid and liver at the present time  to see if she is tolerating the Crestor.  My hope is that this will result  in an improvement.  I will get her back in a couple of weeks and we will go  back over the findings.  In the same time, she will remain on the current  medical regimen.                              Arturo Morton. Riley Kill, MD, [facility]    TDS/MedQ  DD:  07/05/2006  DT:  07/06/2006  Job #:  [id number]

## 2011-03-04 NOTE — Assessment & Plan Note (Signed)
[facility]                              CARDIOLOGY OFFICE NOTE   AVIYANNA, COLBAUGH                 MRN:          [id number]  DATE:07/13/2006                            DOB:          1946/08/19    Mrs. Kaufman is in for a followup visit. She underwent exercise Cardiolite  imaging on the 25th. On the treadmill, she did not have any chest pain and  there were no significant ST-segment changes. At peak exercise, the patient  had evidence of a lateral defect, it does demonstrate some late  redistribution. She also had a hypertensive blood pressure response. There  was probably about 1.5-mm  of ST-depression noted in the lateral leads. She  has not had any real change in her symptoms, and the symptoms largely are  what she said she has continued to have for some time now. She also had a  lipid profile done which reveals an LDL of 79, down from 168 previously. The  HDL was largely unchanged at 34.6, and the triglycerides are controlled at  149. She has been in cardiac rehab, and doing reasonably well.   CURRENT MEDICATIONS:  1. Enteric-coated aspirin 325 mg daily.  2. Plavix 75 mg daily.  3. Lisinopril 5 mg daily.  4. Metoprolol 25 mg b.i.d.  5. Lortab one-a-day Women's.  6. Zetia 10 mg daily.  7. Crestor 10 mg daily.  She has been recently on a medication for shingles which were located in the  right groin area and on the thigh.   PHYSICAL EXAMINATION:  Blood pressure 134/84, pulse 80, weight 150.  LUNGS: Largely unchanged.  CARDIAC: Largely unchanged.   Mr. and Mrs. Laurine Blazer and I reviewed her radionuclide images in detail. She  does have a modest degree of re-distribution. I would expect the scar, and  specifically associated with the very high enzyme levels that she had at the  time of her initial presentation. Nonetheless, based on the findings she and  I have talked about the various options and I think a repeat cardiac  catheterization would be indicated to reassess the vessel in light of the  fact that we did use a non-drug-eluting stent with her infarction  management. I offered for her to have this done on Friday with one of my  colleagues, but she preferred to wait until the beginning of next week as  she is off. We will make arrangements for this to be done on Tuesday  morning. The patient is agreeable.       Arturo Morton. Riley Kill, MD, [facility]     TDS/MedQ  DD:  07/14/2006  DT:  07/16/2006  Job #:  [id number]

## 2011-03-04 NOTE — H&P (Signed)
NAME:  Tracy [name], Tracy [name] NO.:  [id number]   MEDICAL RECORD NO.:  [id number]           PATIENT TYPE:   LOCATION:                                 FACILITY:   PHYSICIAN:  Arturo Morton. Riley Kill, MD, [facility] DATE OF BIRTH:   DATE OF ADMISSION:  DATE OF DISCHARGE:                                HISTORY & PHYSICAL   DATE OF ADMISSION:  July 18, 2006   CHIEF COMPLAINT:  Chest discomfort.   HISTORY OF PRESENT ILLNESS:  Tracy [name] is a very delightful 65 year old  female who presented to the emergency room in late May 2007.  At that time,  she had substernal chest pain.  She underwent emergency cardiac  catheterization and this revealed a 99% circumflex stenosis which was opened  with a non-drug-eluting stent.  Her ejection fraction was 45% with what  appeared to be about 3+ mitral regurgitation.  At that time, she stopped  smoking.  She was subsequently discharged from the hospital.  She has had a  followup echocardiogram to reassess her situation and the mitral  regurgitation is now thought to be 1-2 on a basis of 4 with mild  calcification of the mitral valve.  There was thought to be some mild fixed  atherosclerotic plaque of the aorta.  Overall left ventricular function was  thought to be only mildly decreased.  In general, repeat surface  echocardiogram in the office has been estimated ejection fraction of 55-60%.  She has had some intermittent chest discomfort, but it comes on almost at  any time and is not clearly related to exertion.  She underwent radionuclide  imaging and this reveals a scar in the lateral segment with peri-infarct  ischemia and as a result, we have elected to recommend a repeat  catheterization to better identify her anatomy.   PAST MEDICAL HISTORY:  1. Hypertension.  2. Hyperlipidemia.  3. Chronic tobacco use.  4. Myocardial infarction as described.   CURRENT MEDICATIONS:  1. Enteric-coated aspirin 325 mg daily.  2. Plavix 75 mg daily.  3. Lisinopril 5 mg daily.  4. Metoprolol 25 mg p.o. b.i.d.  5. Xanax 0.25 mg b.i.d.  6. Lortab p.r.n.  7. Zetia 10 mg daily.  8. Crestor 10 mg daily.  9. Antibiotics for shingles.   ALLERGIES:  No known drug allergies.   SOCIAL HISTORY:  The patient works, and she smokes about a pack per day.  She is married.   FAMILY HISTORY:  The patient was adopted and does not know past history.   REVIEW OF SYSTEMS:  Is reviewed.  She does have some shortness of breath.  She has recently developed shingles and has a pustular rash on the right  thigh and pain in the right groin for which she is getting treatment  currently.   PHYSICAL EXAMINATION:  GENERAL:  The patient is an alert, oriented female in  no acute distress.  VITAL SIGNS:  The weight is 150 pounds, the blood pressure 134/84, the pulse  is 80.  NECK:  Neck veins not distended.  No carotid bruits.  Neck is supple.  HEENT:  Unremarkable.  LUNGS:  Clear to auscultation and percussion.  PMI is nondisplaced.  There  is a normal first and second heart sound without murmurs, rubs or gallops.  ABDOMEN:  Soft.  Femoral pulses are intact.  There is a shingles-type  appearing rash in the right thigh region.  EXTREMITIES:  Distal pulses are intact.   Radionuclide imaging study as noted demonstrates lateral infarct with peri-  infarct ischemia.   IMPRESSION:  1. Prior myocardial infarction treated with non-drug-eluting stent with      evidence of possible recurrent ischemia in the infarct territory by      radionuclide imaging.  2. Hypertension, under control.  3. Hyperlipidemia, currently being managed.  4. Recent abnormal liver function studies, now improved.  5. Mitral regurgitation.  6. Shingles.   PLAN:  Diagnostic cardiac catheterization has been discussed with the  patient.  We have talked about the various variables.  She would like to  pursue this.      Arturo Morton. Riley Kill, MD, [facility]  Electronically Signed     TDS/MEDQ   D:  07/14/2006  T:  07/14/2006  Job:  [id number]

## 2011-03-04 NOTE — Cardiovascular Report (Signed)
NAMEMARABELLE, [name] NO.:  [id number]   MEDICAL RECORD NO.:  [id number]          PATIENT TYPE:  OIB   LOCATION:  2899                         FACILITY:  [facility]   PHYSICIAN:  Arturo Morton. Riley Kill, MD, FACCDATE OF BIRTH:  Apr 25, 1946   DATE OF PROCEDURE:  07/18/2006  DATE OF DISCHARGE:  07/18/2006                              CARDIAC CATHETERIZATION   HISTORY OF PRESENT ILLNESS:  Ms. Tracy [name] is a 65 year old woman well known  to me.  She previously presented in May with an acute myocardial infarction  involving the circumflex artery.  She underwent stenting of the circumflex  with a non drug-eluting platform.  Following this the patient has had some  intermittent chest discomfort and an abnormal Cardiolite study suggesting  scar with peri-infarct ischemia.  With this, we elected to recommend a  repeat cardiac catheterization for followup.   PROCEDURE:  1. Left heart catheterization.  2. Selective coronary arteriography.  3. Selective left ventriculography.   DESCRIPTION OF PROCEDURE:  The patient was brought to the catheterization  laboratory and prepped and draped in the usual fashion for an anterior  puncture.  The left femoral artery was easily entered.  The patient had  shingles involving the right groin area.  A #5 French sheath was placed.  Views of the left and right coronary arteries were obtained in multiple  angiographic projections.  Central aortic and left ventricular pressures  were measured with a pigtail.  Ventriculography was done in the RAO  projection.  Following the pressure pull-back the pigtail catheter was  removed.  The patient tolerated the procedure well and there were no  complications.  I reviewed the films with her husband in detail.   ANGIOGRAPHIC DATA:  1. Ventriculography was done in the RAO projection.  Compared to the      previous study, there is slightly more motion of the mid inferior wall      in the distribution of the  circumflex artery from the previous study.      There also appears to be some less mitral regurgitation.  MR would be      graded angiographically as mild at 1 to, at most, 2+.  2. The left coronary artery by fluoroscopy is fairly heavily calcified.  3. The left main is without critical narrowing.  4. The left anterior descending has perhaps mild ostial narrowing of about      20%.  The vessel opens up and then is fairly heavily calcified and      there is an approximate mid 40% area of diffuse stenosis in a heavily      calcified segment.  Beyond this the vessel becomes tortuous, and there      is bifurcational disease.  The bifurcation is difficult to completely      lay it out as it is moderately calcified, but we would estimate      somewhere in the range of 40 to 50%.  This could be slightly worse, but      in most views appears less.  The distal left anterior descending is      without critical  narrowing.  5. The circumflex coronary artery is a fairly large caliber vessel.  It      has been previously stented in the setting of acute coronary syndrome.      The circumflex demonstrates about 30 to 40% narrowing segmentally in      the proximal portion of the stent, whereas the distal portion of the      stent is widely patent without any significant re-narrowing whatsoever.      This overlies a small marginal branch.  There is a vigorous flow into      the distal marginal system.  Critical stenosis is not suggested.  6. The right coronary artery demonstrates about 20 to 30% narrowing in the      proximal mid junction.  There is mild plaquing of about 20% just prior      to the takeoff of the posterior descending branch and acute marginal      branch.  The right coronary artery does supply a collateral and this      appears to be to an occluded septal perforator.   CONCLUSIONS:  1. Preserved overall left ventricular function with wall motion      abnormality involving the mid inferior  segment, but improved from the      acute study.  2. Continued patency of the previously placed stent with segmental      narrowing of about 30 to 40% of the proximal portion of the stent.  3. Extensively calcified left anterior descending artery with calcified      mid plaque.  4. Continued patency of the right coronary artery.   DISPOSITION:  At the present time, I would recommend continued medical  therapy.  I will review the films with my colleagues.  She will remain on  aspirin and Plavix and I will see her back in the office in two days to  comprehensively review the combined radionuclide angiographic imaging.      Arturo Morton. Riley Kill, MD, [facility]  Electronically Signed     TDS/MEDQ  D:  07/18/2006  T:  07/19/2006  Job:  [id number]   cc:   CV Laboratory  Gordy Savers, MD

## 2011-03-04 NOTE — Cardiovascular Report (Signed)
NAME:  [name], [name] NO.:  [id number]   MEDICAL RECORD NO.:  [id number]          PATIENT TYPE:  INP   LOCATION:  2916                         FACILITY:  [facility]   PHYSICIAN:  Arturo Morton. Riley Kill, M.D. [facility] OF BIRTH:  [birth year]   DATE OF PROCEDURE:  03/14/2006  DATE OF DISCHARGE:                              CARDIAC CATHETERIZATION   INDICATIONS:  Ms. Tracy [name] is a 65 year old who presents to the emergency  room with stuttering chest pain; she was seen and admitted by Dr. Samuel Bouche.  EKG changes improved with medical therapy.  There was precordial ST  depression.  Enzymes were positive.  She subsequently was brought to the  catheterization laboratory urgently for evaluation.  I explained this to her  and subsequently to her husband.   PROCEDURE:  1.  Left heart catheterization.  2.  Selective coronary arteriography.  3.  Selective left ventriculography.  4.  Percutaneous transluminal coronary angioplasty and stenting of the      circumflex coronary artery using a non-DES platform.   DESCRIPTION OF PROCEDURE:  The patient was brought to the catheterization  laboratory and prepped and draped in the usual fashion.  Through an anterior  puncture, the femoral artery was entered.  Nitroglycerin was gradually  tapered down, and intravenous metoprolol was administered, based on the  patient's positive enzymes and heart rate in the 90s.  ACT was checked.  A 6-  French sheath was placed.  View of the left and right coronary arteries were  obtained in multiple angiographic projections.  Finally, central aortic and  left ventricular pressures were measured with a pigtail and ventriculography  was performed in the RAO projection; this demonstrated significant mitral  regurgitation.  The patient had ongoing chest pain and subtotal occlusion.  For this reason, we elected to proceed forward and get the vessel open.  With an ACT that was appropriate subsequently, we used a XB 3.5  Cordis  guide.  We used a Prowater wire and we were able to get down into the distal  vessel.  Predilatation was done with a 20 x 2.25 Maverick balloon.  A 2.75 x  28 Vision stent was then placed across the lesion.  The stent was taken up  to approximately 13-14 atmospheres.  There was marked improvement in the  appearance of the artery.  Post-dilatation was then done using a 3.0 Quantum  Maverick balloon throughout the course of the stent and this was taken up to  16 atmospheres throughout the stent, except at the very edges.  These were  dilated up to at least 12 atmospheres.  All catheters were then subsequently  removed and the femoral sheath was sewn into place.  She was taken to the  holding area in satisfactory clinical condition.   HEMODYNAMIC DATA:  1.  Central aortic pressure 122/73, mean 93.  2.  Left ventricular pressure 110/31.  3.  No gradient on pullback across the aortic valve.   ANGIOGRAPHIC DATA:  1.  Ventriculography in the RAO projection reveals mid inferior wall hypo to      akinesis with an ejection fraction that would be estimated  in the range      of 45%.  There was a minimum of 3+ mitral regurgitation with possibly      even more with good visualization of the atrium.  Because of the      elevated LVEDP and the acute coronary syndrome, we did not do an LAO      ventriculogram.  2.  The coronary arteries are generally calcified with a fair amount of      calcification in the LAD.  3.  The left main is free of critical disease.  4.  The LAD has scattered plaquing of about 20% and 30% with moderate      calcification.  At the bifurcation of the LAD and diagonal on the LAO      views, there appears to be about 40% to 50% narrowing and then 40% more      distally.  There is fairly tight stenosis involving a small septal      perforating vessel.  The large diagonal branch also has mid narrowing of      about 50%.  5.  The circumflex vessel is subtotally occluded  with 99% narrowing and TIMI-      1 flow.  Following reperfusion, this TIMI-47flow is taken to TIMI-3 flow.      The stenosis is reduced from 99% to 0% with no evidence of edge tear.      There are some distal irregularities.  6.  The right coronary artery is a moderate-sized vessel.  There is about      30% to 40% narrowing at the junction of the proximal and mid-vessel.      There is no evidence of high-grade focal obstruction throughout the RCA.      There is a fair amount of luminal irregularity.   CONCLUSION:  1.  Acute myocardial infarction due to subtotal occlusion of the circumflex      coronary artery with successful reperfusion therapy and primary stenting      using a non-drug-eluting platform.  2.  Moderate reduction in left ventricular function with significant mitral      regurgitation, question papillary muscle dysfunction.  3.  Scattered irregularities of the other vessels as noted above.   DISPOSITION:  The patient will be taken to the CCU.  She will need  convalescent care.  She will also need 2-D echocardiography in a day or 2 to  assess the severity of the mitral regurgitation.      Arturo Morton. Riley Kill, M.D. [facility]  Electronically Signed     TDS/MEDQ  D:  03/14/2006  T:  03/14/2006  Job:  [id number]   cc:   [facility] CV Laboratory   Gordy Savers, M.D. [facility]  [address] [place]  [place] [address]

## 2011-03-04 NOTE — Discharge Summary (Signed)
NAME:  Tracy [name], Tracy [name] NO.:  [id number]   MEDICAL RECORD NO.:  [id number]          PATIENT TYPE:  INP   LOCATION:  2007                         FACILITY:  [facility]   PHYSICIAN:  Arturo Morton. Riley Kill, M.D. [facility] OF BIRTH:  [birth year]   DATE OF ADMISSION:  03/14/2006  DATE OF DISCHARGE:  03/17/2006                           DISCHARGE SUMMARY - REFERRING   DISCHARGE DIAGNOSIS:  1.  Low inferior myocardial infarction.  2.  Coronary artery disease with stenting to the circumflex, residual non-      obstructive coronary artery disease.  3.  Tobacco use.  4.  3+ mitral regurgitation.  5.  Hyperlipidemia with a low HDL and an elevated LDL.  6.  History as noted below.   PROCEDURES PERFORMED:  Cardiac catheterization and stenting on Mar 14, 2006  by Dr. Riley Kill.   ADMITTING PHYSICIAN:  Dr. Samuel Bouche   DISCHARGING PHYSICIAN:  Dr. Riley Kill   SUMMARY OF HISTORY:  Ms. Kinkaid is a 65 year old African-American female  who presented to the emergency department complaining of chest tightness  that started approximately 9 p.m. on the evening of admission.  She also  described it as a sharp discomfort in her head that radiated down into the  middle part of her chest associated with nausea, diaphoresis.  She denied  shortness of breath.  She received sublingual nitroglycerin and morphine in  the emergency room with significant improvement.   Her history is notable for tobacco use, hypertension, and hyperlipidemia.   LABORATORY DATA:  Chest x-ray on the 28th showed mild bronchitic changes.  Admission weight was 150.  H&H was 16.2 and 47.5, normal indices, platelets  287, WBCs 12.1.  Subsequent hematology showed a slight decrease in her H&H.  At the time of discharge this was 11.9 and 34.8, normal indices, platelets  206, WBCs 10.4.  Admission PTT was 27, PT 12.  Sodium 142, potassium 3.6,  BUN 11, creatinine 0.8, glucose 145, normal LFTs.  Subsequent chemistry on  29th showed an  elevation of her AST and ALT of 413 and 86, respectively.  Last chemistry was performed on the 30th.  This showed a sodium of 142,  potassium 3.8, BUN 9, creatinine 0.7, glucose 115.  Hemoglobin A1c was 5.8.  Initial CK-MB was 659, 104.7, relative index 15.9, and a troponin 2.13.  Second CK was 4638 with an MB of 466.3 and a relative index of 10.1.  Subsequent enzymes were declining.  BNP was less than 30.  Total cholesterol  was 173, triglycerides 133, HDL 37, LDL 109.  TSH was 2.321.  EKGs on  initial presentation showed a significant ST-segment depression in V1-V4.  After pain relief these EKG changes resolved.   HOSPITAL COURSE:  Patient initially presented to [facility] Emergency Room.  She was treated by Dr. Samuel Bouche with IV heparin, Integrilin, and nitroglycerin  which resolved her discomfort.  She was transferred to [facility] to an ICU  bed.  Prior to transfer she was not having any discomfort.  During  transportation she developed reoccurring discomfort.  However, repeat EKGs  did not show any changes.  Due to her reoccurring discomfort  and her  positive initial enzymes she was taken to emergently to the cardiac  catheterization laboratory by Dr. Riley Kill on Mar 14, 2006.  Culprit lesion  was a 99% proximal circumflex which was reduced with a Vision stent to 0%.  Her EF was 45% with 3+ MR.  She does have residual non-obstructive coronary  artery disease.  During her bed rest tobacco cessation consult was performed  and she began to receive education in regards to myocardial infarction.  On  Mar 16, 2006 echocardiogram was performed.  This confirmed an ejection  fraction of 40-45% with basilar mid posterior lateral wall akinesis,  inferior hypokinesis, focal basal septal hypertrophy, mild AI, mild MAC with  moderate MR and left atrial enlargement.  She was transferred to the unit  2000 where cardiac rehabilitation assisted with education and ambulation.  Medications continued to be  adjusted as well as further education.  By March 17, 2006 Dr. Riley Kill felt that she could be discharged home.   DISPOSITION:  She is discharged home.  Asked to maintain low salt, fat,  cholesterol diet.  She is advised no driving, lifting, sexual activity, or  heavy exertion for two weeks.  Dr. Riley Kill asked her not to return to work  for four weeks.  Wound care as per supplemental discharge sheet in regards  to her catheterization site.  She was asked to bring all medications to all  appointments.   MEDICATIONS:  1.  Aspirin 325 mg daily.  2.  Plavix 75 daily.  3.  Nitroglycerin 0.4 p.r.n.  4.  Lisinopril 5 daily.  5.  Metoprolol 25 b.i.d.  6.  Lipitor was increased to 80 mg q.h.s.  7.  She was given permission to continue her Xanax and Lortab.  8.  She was asked not to take her HCTZ, Plendil, and potassium.   She was advised no smoking or tobacco products.  She will follow up with Dr.  Riley Kill on Monday, January 20, 2006 at 12:30 for medication readjustment.  She  will need blood work in approximately six to eight weeks since her Lipitor  was increased.  Discharge time greater than 30 minutes.      Joellyn Rued, P.A. [facility]      Thomas D. Riley Kill, M.D. [facility]  Electronically Signed    EW/MEDQ  D:  03/17/2006  T:  03/17/2006  Job:  [id number]   cc:   Gordy Savers, M.D. [facility]  [address] [place]  [place] [address]

## 2011-03-04 NOTE — H&P (Signed)
NAME:  Tracy [name], Tracy [name] NO.:  [id number]   MEDICAL RECORD NO.:  [id number]          PATIENT TYPE:  INP   LOCATION:  0104                         FACILITY:  [facility]   PHYSICIAN:  Rod Holler, MD      DATE OF BIRTH:  Oct 17, 1946   DATE OF ADMISSION:  03/13/2006  DATE OF DISCHARGE:                                HISTORY & PHYSICAL   CHIEF COMPLAINT:  Chest pain.   HISTORY OF PRESENT ILLNESS:  Ms. Nemitz is a 65 year old female with a  history of tobacco use, hypertension, hyperlipidemia, who presented to the  emergency department with complaints of chest tightness that started about 9  p.m. tonight.  The chest tightness she describes as a sharp discomfort in  her head that radiated down to the middle part of her chest.  There was no  radiation of the chest discomfort to her arms.  She describes the tightness  as a sharp discomfort.  The discomfort was also associated with nausea and  diaphoresis.  No shortness of breath.  The chest discomfort was improved  with sublingual nitroglycerin and morphine in the emergency department.  The  patient now complains of only mild pressure in the middle part of her chest.  She states that she has only had one episode of exertional chest pain in the  past.   PAST MEDICAL HISTORY:  1.  Hypertension.  2.  Hyperlipidemia.  3.  Tobacco use.   MEDICATIONS:  1.  Hydrochlorothiazide.  2.  Lipitor.  3.  Plendil.  4.  Potassium chloride.  5.  Xanax p.r.n.  6.  Lortab p.r.n.   ALLERGIES:  No known drug allergies.   SOCIAL HISTORY:  The patient smokes one pack per day.   FAMILY HISTORY:  The patient was adopted and does not know any of her family  history.   REVIEW OF SYSTEMS:  All systems are reviewed in detail and negative except  as noted in the history of present illness.   PHYSICAL EXAMINATION:  VITAL SIGNS:  Initial blood pressure 163/100,  currently 109/58.  Heart rate 108 initially, currently 90.  Respiratory rate  20.  Oxygen saturation 97% on room air.  GENERAL:  A well-developed and well-nourished female, alert and oriented x3.  No apparent distress.  HEENT:  Atraumatic and normocephalic.  Pupils are equal, round and reactive  to light.  Extraocular movements are intact.  Oropharynx clear.  NECK:  Supple.  No adenopathy.  No JVD.  No carotid bruits.  CHEST/LUNGS:  Clear to auscultation bilaterally with equal bilateral breath  sounds.  HEART:  Regular rhythm.  Normal rate.  Normal S1 and S2.  No murmurs, rubs  or gallops.  Peripheral pulses are 2+.  No femoral bruits.  ABDOMEN: Soft, nontender, nondistended.  Active bowel sounds.  No  hepatosplenomegaly.  EXTREMITIES:  No clubbing, cyanosis or edema.  NEUROLOGIC:  No focal deficits.  SKIN:  No rashes.  PSYCHIATRIC:  Normal affect.   Chest x-ray shows mild bronchitic changes.   CK-MB 4.6, troponin 0.08.  Myoglobin 302.  PTT 27, INR 0.9, white blood cell  count 12.1,  hematocrit 47.5, platelets 287.  Sodium 142, potassium 3.6,  chloride 104, bicarb 32, BUN 11, creatinine 0.8, glucose 145.   Initial EKG showed sinus tachycardia, anterior septal ST segment depression,  poor quality tracing.   Repeat ECG shows sinus rhythm, PVC, improving anterior septal ST  depressions.   IMPRESSION:  Ms. Hegner is a 65 year old female with known history of  tobacco use, hypertension, and hyperlipidemia, who presents with symptoms  concerning for acute coronary syndrome.  Her repeat ECG is improved from her  initial EKG, and her chest pain is much improved as well.   PLAN:  1.  Cardiovascular:  Will get serial cardiac enzymes, place the patient on      daily aspirin, Lipitor at 80 mg p.o. daily, lisinopril at 2.5 mg p.o.      daily, Lopressor 12.5 mg p.o. q.6h.  We will place the patient on a      heparin bolus and heparin drip per the pharmacy protocol for acute      coronary syndrome.  Patient will be admitted to a step-down unit.  We      will get  fasting lipids in the morning, daily EKG, p.r.n. sublingual      nitroglycerin, and anticipate cardiac catheterization in the morning.  2.  Gastrointestinal:  Antiemetic medicines, comprehensive metabolic panel      in the morning.  3.  Pulmonary:  Supplemental oxygen to keep O2 sats greater than 91%.  4.  Hematologic:  Will guaiac all stools, get a CBC in the morning.  5.  Endocrine:  Sliding scale insulin.  Thyroid function tests in the      morning.  6.  Fluids, electrolytes, nutrition:  Make the patient n.p.o.  Replete      potassium.  Get a magnesium level with the next lab draw.      Rod Holler, MD  Electronically Signed     TRK/MEDQ  D:  03/14/2006  T:  03/14/2006  Job:  [id number]

## 2011-03-04 NOTE — H&P (Signed)
NAME:  Tracy [name], Tracy [name] NO.:  [id number]   MEDICAL RECORD NO.:  [id number]          PATIENT TYPE:  INP   LOCATION:  2009                         FACILITY:  [facility]   PHYSICIAN:  Arturo Morton. Riley Kill, MD, FACCDATE OF BIRTH:  [birth year]   DATE OF ADMISSION:  01/16/2007  DATE OF DISCHARGE:                              HISTORY & PHYSICAL   PRIMARY CARE PHYSICIAN:  Dr. Amador Cunas.   PRIMARY CARDIOLOGY:  Dr. Shawnie Pons.   CHIEF COMPLAINT:  Chest pain.   HISTORY OF PRESENT ILLNESS:  Ms. Goracke is a very pleasant 60-year  female patient with a history of coronary artery disease, status post  circumflex infarct May 2007 and treated with a bare metal stent to the  circumflex who presents to the emergency room today with complaints of  increasing fatigue, neck and low back pain and left-sided chest pain for  the last week.  She says these symptoms are similar to what she had  prior to her heart attack in May 2007.  She notes a recent history of  angina.  She says that she has had this since her myocardial infarction.  She has usually taken nitroglycerin sublingually for this.  This has  been fairly stable.  Over the last several months she has actually had a  decrease her symptoms and had been feeling well.  Over this past week  she has noted increasing frequency of chest pain.  She notes that it  comes on at rest.  She can also bring it on with exertion.  She notes  dyspnea exertion as well.  Today her pain was at its worse.  She had  associated diaphoresis and nausea.  There was no associated syncope or  radiating symptoms.  Her symptoms worried her to the point that she came  to the emergency room for further evaluation.   PAST MEDICAL HISTORY SIGNIFICANT FOR:  1. Coronary artery disease.      a.     Status post circumflex infarct May 2007, treated with a bare       metal stent to the circumflex.      b.     Chest pain with positive nuclear study in October 2007     resulting in cardiac catheterization.      c.     Cardiac catheterization July 18, 2006:  Calcified LAD with       ostial 20% stenosis, mid 40% stenosis and bifurcational disease at       40-50%; circumflex 30-40% at proximal stent, distal stent okay;       RCA 20-30% proximal/mid, 20% after the posterior descending artery       and collaterals from the right coronary artery to a totally       occluded septal perforator.  She was treated medically.  2. Hypertension.  3. Hyperlipidemia.      a.     She has a history of elevated LFTs with Lipitor.  4. Osteoarthritis.  5. Allergic rhinitis.  6. Good LV function, EF 55-60%.  7. Mild-to-moderate mitral regurgitation.      a.     Transesophageal  echocardiogram July 2007 revealing mild       mitral regurgitation.  8. Status post tonsillectomy and adenoidectomy in her 68s.   MEDICATIONS:  1. Allegra-D p.r.n.  2. Hydrocodone/APAP 5/500 mg p.r.n.  3. Aspirin 81 mg daily.  4. Zetia 10 mg a day.  5. Metoprolol 25 mg b.i.d.  6. Lisinopril 5 mg daily.  7. Plavix 75 mg a day.  8. Alprazolam 0.25 mg p.r.n.  9. Zyrtec 10 mg daily p.r.n.  10.Nitroglycerin p.r.n. chest pain.  11.Multivitamin daily.  12.Crestor 10 mg nightly.   ALLERGIES:  NO KNOWN DRUG ALLERGIES.   SOCIAL HISTORY:  She denies any current tobacco abuse.  She is an ex-  smoker; she has about a 30 pack-year history.  She denies any alcohol  abuse.  She works [employer].  She is married and has 2 children.   FAMILY HISTORY:  Unknown.  She is adopted.   REVIEW OF SYSTEMS:  Please see the HPI.  She had a recent URI.  This has  resolved.  Denies any recent fevers, chills, headache, sore throat.  She  denies any orthopnea or paroxysmal nocturnal dyspnea.  She has had some  palpitations at bedtime.  Denies any edema.  Denies any syncope or near  syncope.  Denies any claudication.  Denies any cough.  Denies any  hemoptysis.  There is no dysuria, hematuria, weakness,  numbness.  She  does note fatigue.  She does note neck pain and low back pain.  She  denies any vomiting, diarrhea, bright red blood per rectum, melena.  She  has noted some dyspepsia.  Denies any dysphagia, odynophagia.  Rest of  the review of systems are negative.   PHYSICAL EXAMINATION:  GENERAL:  She is a well-nourished, well-developed  female in no distress.  VITAL SIGNS:  Blood pressure 162/86, pulse 93, respirations 20, oxygen  saturation 98% on room air.  HEAD:  Normocephalic, atraumatic.  EYES: PERRLA, EOMI, sclerae clear.  NECK:  Without JVD.  Lymph without lymphadenopathy.  Endocrine without  thyromegaly.  Carotids without bruits bilaterally.  CARDIAC:  Normal S1, S2.  Regular rate and rhythm with 1/6 systolic  ejection murmur best at the apex.  LUNGS: Clear to auscultation bilaterally without wheezing, rhonchi,  rales.  ABDOMEN:  Soft, nontender with normoactive bowel sounds.  No  organomegaly.  EXTREMITIES: No clubbing, cyanosis or edema.  MUSCULOSKELETAL:  See has notable Heberden's and Bouchard's nodes to  bilateral hands.  NEUROLOGIC:  She is oriented x3.  Cranial nerves II-XII grossly intact.  SKIN:  Warm and dry without lesion.   Chest x-ray shows stable findings without acute disease.   EKG reveals sinus rhythm, heart rate of 93, normal axis, no acute  change.   LABORATORY DATA:  Hemoglobin 15.3, hematocrit 45.  Sodium 140, potassium  4.1, BUN 20, creatinine 0.7, glucose 94.  Point-of-care markers negative  x1.   IMPRESSION:  1. Chest pain syndrome.  2. Coronary artery disease.      a.     History circumflex infarct treated with a bare metal stent       to the circumflex artery May 2007.      b.     Cardiac catheterization October 2007 with patent stent,       calcified left anterior descending and nonobstructive disease-       medical treatment. 3. Preserved left ventricular function with an ejection fraction of 55-      60%.  4. Mild-to-moderate  mitral irritation.  5. Hyperlipidemia, treated.  6. Hypertension.  7. Allergic rhinitis  8. Recent history of dyspepsia.   PLAN:  The patient will be admitted.  She will be placed on aspirin and  heparin therapy.  Her home medications will be continued.  Her  metoprolol will be increased to 50 mg twice daily for antianginal effect  as her blood pressure and her heart rate can certainly tolerate this.  We will also add proton pump inhibitor therapy with Protonix 40 mg  daily.  We will check serial cardiac markers and EKGs.  The patient was  also interviewed and by Dr. Shawnie Pons.  After discussion we decided  to proceed with cardiac catheterization tomorrow with Dr. Riley Kill to  relook her coronary anatomy.      Tereso Newcomer, PA-C      Arturo Morton. Riley Kill, MD, [facility]  Electronically Signed    SW/MEDQ  D:  01/16/2007  T:  01/17/2007  Job:  [id number]   cc:   Gordy Savers, MD

## 2011-03-04 NOTE — Assessment & Plan Note (Signed)
[facility]                              CARDIOLOGY OFFICE NOTE   Tracy [name], Tracy [name]                 MRN:          [id number]  DATE:05/04/2006                            DOB:          09-18-1946    [name] is in for a followup visit.  She continues to remain in the  rehabilitation program.  She underwent a 2-D echocardiogram.  This  echocardiogram demonstrates some improvement in overall left ventricular  function with an ejection fraction now in the range of about 55-60%.  Overall valvular architecture appears intact.  There was mild to moderate  mitral valve regurgitation.  Left atrial size was upper limits of normal.  There was mild decrease in the overall inferior wall appearance.  The  patient had followup lipids, now back on Lipitor 10 mg.  She had  significantly elevated liver functions on 80 mg of Lipitor.  Whether this  was related to the Lipitor was uncertain.  We stopped, normalized her liver  functions, and have started her back on Lipitor 10.  With this, her SGOT and  PT are both slightly elevated, and her LDL despite all of this remains at  124.   At the time of her catheterization, we placed a 2.75 x 28 Vision stent  across the left anterior descending artery, and this was may of this year.  The LAD itself had about 40-50% narrowing.  The circumflex was subtotally  occluded, and there was reduction to 0%.  The right coronary artery had 30-  40% narrowing of the junction of the proximal and mid vessel.  The patient  had fairly significant mitral regurgitation that was gauged at 3+ at the  time of the original procedure.  Echo before discharge suggested moderate  mitral regurgitation.   PHYSICAL EXAMINATION:  VITAL SIGNS:  Today on examination, the pulse is 91,  the blood pressure 102/63.  LUNGS:  The lung fields are clear.  HEART:  There is a soft apical murmur, compatible with mitral regurgitation.  EXTREMITIES:  No  edema.   Electrocardiogram demonstrates normal sinus rhythm.  There is nonspecific T  wave abnormality and borderline QT prolongation.   Recent echocardiogram suggests an ejection fraction of 55-60%.  Tracings  from rehabilitation show a trigeminal rhythm.   The patient currently remains on aspirin, Plavix, lisinopril and metoprolol.  She is on Lipitor, but, given the elevation, I think was are going to go  ahead and stop this and repeat her lipid and liver profile in a couple of  weeks.  At that time, we will repeat her liver function studies with the  hope that things are improved,  I think we will probably go ahead and start  her on some Zetia as an  alternative while we see if her liver functions normalize.  We will also  schedule her for a TEE to better assess her mitral regurgitation.  This will  be set up with Dr. Tenny Craw.  Arturo Morton. Riley Kill, MD, [facility]    TDS/MedQ  DD:  05/04/2006  DT:  05/05/2006  Job #:  [id number]

## 2011-03-04 NOTE — Assessment & Plan Note (Signed)
[facility]                            CARDIOLOGY OFFICE NOTE   [name], [name]                 MRN:          [id number]  DATE:01/30/2007                            DOB:          [birth year]    [name] is in today for a follow up visit.  In general she has been  stable.  She continues to have some palpitations, but she is getting  along reasonably well.  She underwent cardiac catheterization on January 17, 2007.  This revealed 30%-40% narrowing after the diagonal take off  with possible occlusion of the septal perforator which appeared to fill  by the right coronary.  There is 40%-50% narrowing of a large diagonal.  The LAD was felt to wrap the apex.  The stent site revealed no more than  30%-40% in-stent narrowing with a 30%-50% distal stenosis.  The right  coronary artery had no critical disease.  There was 1 to at most 2+  mitral regurgitation with an estimated ejection fraction in the 50%  range.  She continues to have some palpitations, but denies any syncope  or presyncope.  Laboratory studies done revealed improved cholesterol  picture.  She is has not returned to smoking.   EXAMINATION:  Blood pressure 122/74, pulse 76.  LUNGS:  Clear.  CARDIAC:  Rhythm is regular.  There is a soft apical murmur.   The electrocardiogram demonstrates normal sinus rhythm, essentially  within normal limits with left atrial enlargement.   The patient has evidence of mitral regurgitation.  She has had a prior  percutaneous intervention for an acute myocardial infarction and is  stabilized.  Her MR seemed to decrease after that.  She has had  transesophageal echocardiography which does not suggest critical MR.  At  the present time a continued medical program will be recommended.  We  will continue Plavix for a minimum of 1 year.  At the time of her  catheterization her LVDP was high, but this may have been related to an  elevated blood pressure at  that time.     Arturo Morton. Riley Kill, MD, [facility]  Electronically Signed    TDS/MedQ  DD: 01/30/2007  DT: 01/31/2007  Job #: [id number]

## 2011-03-04 NOTE — Cardiovascular Report (Signed)
NAME:  Tracy [name], Tracy [name] NO.:  [id number]   MEDICAL RECORD NO.:  [id number]          PATIENT TYPE:  INP   LOCATION:  2009                         FACILITY:  [facility]   PHYSICIAN:  Arturo Morton. Riley Kill, MD, FACCDATE OF BIRTH:  [birth year]   DATE OF PROCEDURE:  01/17/2007  DATE OF DISCHARGE:                            CARDIAC CATHETERIZATION   INDICATIONS:  Tracy [name] is a delightful 65 year old who presented in  May 2007 with a lateral wall myocardial infarction.  She underwent  primary percutaneous coronary intervention with stenting of the  circumflex vessel.  She was restudied in October 2007.  She presented  yesterday with symptoms that she felt were similar to her prior acute  infarction.  She was admitted, and we elected to recommend repeat  cardiac catheterization.  The patient did have mitral regurgitation at  the time of her acute myocardial infarction.  This was graded at 2+ on  the last catheterization and had improved.  Transesophageal  echocardiography was also performed because of the severity of MR.  It  suggested mild and at most moderate MR with a central jet.  The current  study was done to assess coronary anatomy.   PROCEDURES:  1. Left heart catheterization.  2. Selective coronary arteriography.  3. Selective left ventriculography in two planes.   DESCRIPTION OF PROCEDURE:  The patient was brought to the cath lab and  prepped and draped in the usual fashion.  Through an anterior puncture,  the right femoral artery was easily entered.  A 6-French sheath was  placed.  Views of left and right coronaries were obtained in multiple  angiographic projections.  Central aortic left ventricular pressures  were measured.  A pigtail ventriculography was performed in the RAO  projection.  It was also repeated in the LAO projection.  I then  reviewed the films in detail with the patient's husband.  The patient  tolerated procedure without complication.  She was  taken to the holding  area in satisfactory clinical condition.   HEMODYNAMIC DATA:  1. Central aortic pressure was 169/95.  2. Left ventricular pressure was 148/26 with mean aortic pressure 127.  3. There was no gradient pullback across aortic valve.   ANGIOGRAPHIC DATA:  1. On plain fluoroscopy, there is extensive calcification of the      coronary system, most predominantly in the left anterior descending      vessel.  2. The LAD has about 30-40% area of narrowing just after the diagonal      takeoff.  On projections of the right coronary artery, there is as      the septal perforator that probably is occluded and fills by      collaterals.  The mid LAD is highly tortuous, and there is      segmental plaquing throughout as noted on previous studies.      However, it does not appear to be critical, and we did left lateral      views and steep RAO views try to lay this area out as well as      possible.  There is also about 40-50% area  of narrowing in the      large diagonal branch.  The LAD wraps the apex.  It did not appear      to be critical.  Exam of the circumflex provides a tiny first      marginal branch that is free of critical disease.  The AV      circumflex prior to the bifurcation of the large marginal and AV      circumflex has a previously placed stent.  There is smooth healing      within the stent with about 30% in-stent renarrowing, but it does      not appear to be high-grade nor significantly restenosed.  The      lumen is widely patent.  The distal vessel is intact.  There is a      30-50% area of distal stenosis in the large marginal branch.  3. The right coronary artery is a large-caliber vessel providing      posterior descending and posterolateral system.  No critical      stenoses are noted.  4. Ventriculography in the RAO projection reveals preserved global      systolic function without segmental wall motion abnormality beyond      mild hypokinesis in the  inferior segment that corresponds to the      distribution of the circumflex.  Ejection fraction is estimated at      50%.  There is mitral regurgitation, but in careful comparison to      the original acute study it does not appear to be as much, and it      would be estimated to be about 1 to at most 2+.  The LAO view also      reveals 1 to 2+ regurgitation.   CONCLUSIONS:  1. Mild reduction in overall left ventricular function with a small      wall motion abnormality involving the inferior segment but improved      from the acute study.  2. Mild to no more than moderate mitral regurgitation as noted on      previous studies and less than on the acute study.  3. Continued patency of the infarct-related artery at the previous      stent site involving the circumflex coronary artery.  4. Scattered disease of the left anterior descending artery with      moderate calcification and scattered plaquing.   DISPOSITION:  At the present time, the patient will be treated  medically.  The LVEDP is rather high.  I suspect that this is secondary  to longstanding hypertension.  She will return to Tracy [name] me in followup.  We will continue to follow her closely.      Arturo Morton. Riley Kill, MD, [facility]  Electronically Signed     TDS/MEDQ  D:  01/17/2007  T:  01/18/2007  Job:  [id number]   cc:   Arturo Morton. Riley Kill, MD, [facility]  Gordy Savers, MD

## 2011-03-04 NOTE — Assessment & Plan Note (Signed)
[place] [facility]                              CARDIOLOGY OFFICE NOTE   TENAE, GRAZIOSI                 MRN:          [id number]  DATE:07/20/2006                            DOB:          03-Apr-1946    Ms. Tracy [name] is in for followup.  She underwent cardiac catheterization.  This demonstrated some mild healing in the stent site, but there were was  not significant restenosis.  She has a fair amount of calcified disease in  the LAD, and it does not appear to be critical, although it is perhaps more  than just minor and the degree of calcification is extensive.  Her main  complaint is right parasternal chest discomfort and she seems to be clearly  tender to palpation in the right parasternal area.  Her Cardiolite was  abnormal but it was in the infarct zone.   PHYSICAL EXAMINATION:  VITAL SIGNS:  The blood pressure is 124/82.  The  pulse is 85.  MUSCULOSKELETAL:  The groin is healed.   At the present time, we will continue her on medical therapy.  I plan to see  her back in followup in 6 weeks.  Her husband and I reviewed her films in  detail today.  Her LDL is 79 and she is going to continue to work on diet  and in 3 months we will get another lipid and liver profile.  Should she  have any problems in the interim, she is to call us promptly.  I spent  extensive time talking about the mechanism of acute coronary syndromes.       Arturo Morton. Riley Kill, MD, [facility]     TDS/MedQ  DD:  07/20/2006  DT:  07/21/2006  Job #:  [id number]

## 2011-03-04 NOTE — Assessment & Plan Note (Signed)
[place] [facility]                            CARDIOLOGY OFFICE NOTE   Tracy [name], Tracy [name]                 MRN:          [id number]  DATE:09/04/2006                            DOB:          06-09-1946    [name] is in for followup.  She has minimal sharp chest pain.  She  has had an upper respiratory infection.  This developed into chest  congestion and she was given a Z-Pak.  Her Crestor was held.  She is  somewhat improved at this point in time.   On her examination today the blood pressure is 124/80, the pulse is 86.  The lung fields are clear.  There is no rub noted.  Extremities reveal  no edema.   Electrocardiogram demonstrates normal sinus rhythm with occasional  unifocal premature ventricular contractions.   Our plan is to continue to treat her medically at the present time.  Her  last LDL is 79 and she is going to continue to work on diet, and we will  get a lipid and liver profile within a few months.  She is no longer  smoking, and hopefully she will be able to maintain this status for some  time.     Arturo Morton. Riley Kill, MD, [facility]  Electronically Signed    TDS/MedQ  DD: 09/04/2006  DT: 09/05/2006  Job #: [id number]

## 2011-04-14 ENCOUNTER — Other Ambulatory Visit: Payer: Self-pay

## 2011-04-14 DIAGNOSIS — R52 Pain, unspecified: Secondary | ICD-10-CM

## 2011-04-14 MED ORDER — HYDROCODONE-ACETAMINOPHEN 5-500 MG PO TABS: 1.0000 | ORAL_TABLET | Freq: Every day | ORAL | Status: DC | PRN

## 2011-04-14 MED ORDER — ALPRAZOLAM 0.25 MG PO TABS: 0.2500 mg | ORAL_TABLET | Freq: Two times a day (BID) | ORAL | Status: DC | PRN

## 2011-04-14 NOTE — Telephone Encounter (Signed)
Ok  #50 each

## 2011-04-14 NOTE — Telephone Encounter (Signed)
[facility] 

## 2011-04-14 NOTE — Telephone Encounter (Signed)
Refill request for vicodin 5-500 last written 02/15/11 #50 0RF and alprazolam0.25mg  last written 12/24/10 #50 0RF , last seen 06/2010 Please advise

## 2011-06-21 ENCOUNTER — Other Ambulatory Visit: Payer: Self-pay | Admitting: Internal Medicine

## 2011-06-21 DIAGNOSIS — Z1231 Encounter for screening mammogram for malignant neoplasm of breast: Secondary | ICD-10-CM

## 2011-07-06 ENCOUNTER — Ambulatory Visit (HOSPITAL_COMMUNITY)
Admission: RE | Admit: 2011-07-06 | Discharge: 2011-07-06 | Disposition: A | Discharge status: Home / Self Care | Payer: 59 | Source: Ambulatory Visit | Attending: Internal Medicine | Admitting: Internal Medicine

## 2011-07-06 DIAGNOSIS — Z1231 Encounter for screening mammogram for malignant neoplasm of breast: Secondary | ICD-10-CM | POA: Insufficient documentation

## 2011-07-13 ENCOUNTER — Other Ambulatory Visit: Payer: Self-pay

## 2011-07-13 DIAGNOSIS — R52 Pain, unspecified: Secondary | ICD-10-CM

## 2011-07-13 MED ORDER — ALPRAZOLAM 0.25 MG PO TABS: 0.2500 mg | ORAL_TABLET | Freq: Two times a day (BID) | ORAL | Status: DC | PRN

## 2011-07-13 MED ORDER — HYDROCODONE-ACETAMINOPHEN 5-500 MG PO TABS: 1.0000 | ORAL_TABLET | Freq: Every day | ORAL | Status: DC | PRN

## 2011-07-13 NOTE — Telephone Encounter (Signed)
[facility] 

## 2011-07-13 NOTE — Telephone Encounter (Signed)
ok 

## 2011-07-13 NOTE — Telephone Encounter (Signed)
Fax refill request from [facility] for vicodin and alprazolam - last seen 06/2010 Last written 03/2011 # 50 0RF Please advise

## 2011-07-22 ENCOUNTER — Ambulatory Visit: Payer: 59

## 2011-07-22 ENCOUNTER — Other Ambulatory Visit (INDEPENDENT_AMBULATORY_CARE_PROVIDER_SITE_OTHER): Payer: 59

## 2011-07-22 DIAGNOSIS — Z23 Encounter for immunization: Secondary | ICD-10-CM

## 2011-07-22 DIAGNOSIS — Z0000 Encounter for general adult medical examination without abnormal findings: Secondary | ICD-10-CM

## 2011-07-22 LAB — POCT URINALYSIS DIPSTICK
Glucose, UA: NEGATIVE
Ketones, UA: NEGATIVE
Leukocytes, UA: NEGATIVE
Nitrite, UA: NEGATIVE
Spec Grav, UA: 1.03
Urobilinogen, UA: 0.2
pH, UA: 5

## 2011-07-22 LAB — CBC WITH DIFFERENTIAL/PLATELET
Basophils Absolute: 0 10*3/uL (ref 0.0–0.1)
Basophils Relative: 0.4 % (ref 0.0–3.0)
Eosinophils Absolute: 0.3 10*3/uL (ref 0.0–0.7)
Eosinophils Relative: 2.6 % (ref 0.0–5.0)
HCT: 44 % (ref 36.0–46.0)
Hemoglobin: 14.7 g/dL (ref 12.0–15.0)
Lymphocytes Relative: 27.7 % (ref 12.0–46.0)
Lymphs Abs: 3.3 10*3/uL (ref 0.7–4.0)
MCHC: 33.3 g/dL (ref 30.0–36.0)
MCV: 87.6 fl (ref 78.0–100.0)
Monocytes Absolute: 1 10*3/uL (ref 0.1–1.0)
Monocytes Relative: 8.5 % (ref 3.0–12.0)
Neutro Abs: 7.3 10*3/uL (ref 1.4–7.7)
Neutrophils Relative %: 60.8 % (ref 43.0–77.0)
Platelets: 249 10*3/uL (ref 150.0–400.0)
RBC: 5.03 Mil/uL (ref 3.87–5.11)
RDW: 15.3 % — ABNORMAL HIGH (ref 11.5–14.6)
WBC: 12 10*3/uL — ABNORMAL HIGH (ref 4.5–10.5)

## 2011-07-22 LAB — HEPATIC FUNCTION PANEL
ALT: 25 U/L (ref 0–35)
AST: 25 U/L (ref 0–37)
Albumin: 4.1 g/dL (ref 3.5–5.2)
Alkaline Phosphatase: 69 U/L (ref 39–117)
Bilirubin, Direct: 0 mg/dL (ref 0.0–0.3)
Total Bilirubin: 0.4 mg/dL (ref 0.3–1.2)
Total Protein: 7.8 g/dL (ref 6.0–8.3)

## 2011-07-22 LAB — LIPID PANEL
Cholesterol: 226 mg/dL — ABNORMAL HIGH (ref 0–200)
HDL: 37.5 mg/dL — ABNORMAL LOW (ref >39.00)
Total CHOL/HDL Ratio: 6
Triglycerides: 227 mg/dL — ABNORMAL HIGH (ref 0.0–149.0)
VLDL: 45.4 mg/dL — ABNORMAL HIGH (ref 0.0–40.0)

## 2011-07-22 LAB — TSH: TSH: 2.41 u[IU]/mL (ref 0.35–5.50)

## 2011-07-22 LAB — BASIC METABOLIC PANEL
BUN: 16 mg/dL (ref 6–23)
CO2: 24 mEq/L (ref 19–32)
Calcium: 8.9 mg/dL (ref 8.4–10.5)
Chloride: 108 mEq/L (ref 96–112)
Creatinine, Ser: 0.8 mg/dL (ref 0.4–1.2)
GFR: 75.83 mL/min (ref >60.00)
Glucose, Bld: 109 mg/dL — ABNORMAL HIGH (ref 70–99)
Potassium: 4.4 mEq/L (ref 3.5–5.1)
Sodium: 140 mEq/L (ref 135–145)

## 2011-07-22 LAB — LDL CHOLESTEROL, DIRECT: Direct LDL: 163.6 mg/dL

## 2011-07-28 ENCOUNTER — Encounter: Payer: Self-pay | Admitting: Cardiology

## 2011-07-29 ENCOUNTER — Ambulatory Visit (INDEPENDENT_AMBULATORY_CARE_PROVIDER_SITE_OTHER): Payer: 59 | Admitting: Internal Medicine

## 2011-07-29 ENCOUNTER — Other Ambulatory Visit (HOSPITAL_COMMUNITY)
Admission: RE | Admit: 2011-07-29 | Discharge: 2011-07-29 | Disposition: A | Discharge status: Home / Self Care | Payer: 59 | Source: Ambulatory Visit | Attending: Internal Medicine | Admitting: Internal Medicine

## 2011-07-29 ENCOUNTER — Encounter: Payer: Self-pay | Admitting: Internal Medicine

## 2011-07-29 DIAGNOSIS — I10 Essential (primary) hypertension: Secondary | ICD-10-CM

## 2011-07-29 DIAGNOSIS — Z0000 Encounter for general adult medical examination without abnormal findings: Secondary | ICD-10-CM

## 2011-07-29 DIAGNOSIS — I252 Old myocardial infarction: Secondary | ICD-10-CM

## 2011-07-29 DIAGNOSIS — Z01419 Encounter for gynecological examination (general) (routine) without abnormal findings: Secondary | ICD-10-CM | POA: Insufficient documentation

## 2011-07-29 DIAGNOSIS — E7800 Pure hypercholesterolemia, unspecified: Secondary | ICD-10-CM

## 2011-07-29 MED ORDER — SIMVASTATIN 40 MG PO TABS: 40.0000 mg | ORAL_TABLET | Freq: Every evening | ORAL | Status: DC

## 2011-07-29 NOTE — Progress Notes (Signed)
Subjective:    Patient ID: Tracy [name], female    DOB: 1946/09/26, 65 y.o.   MRN: [id number]  HPI  65 year old patient who is followed by cardiology for coronary artery disease. She is scheduled for followup next month. She is seen here today for an annual exam. Medical regimen has included Crestor 10 mg in the past she has not been taking this medication due to cost considerations. She has recently obtained a health insurance since she lost her job about one year ago. She states that she had been on Lipitor in the past but this was discontinued apparently due to elevated liver function studies. She feels well today without any cardiopulmonary complaints. Medical regimen also includes Zetia  Allergies:   1) ! Erythromycin  2) Sulfamethoxazole (Sulfamethoxazole)   Past History:  Past Medical History:  Headache  Hypertension  Myocardial infarction, hx of  High Cholesterol  Menopausal Syndrome  Allergic rhinitis  Low back pain  tobacco use  overactive bladder  Past Medical History  Diagnosis Date  . Headache   . Hypertension   . Myocardial infarct     hx of  . Hyperlipidemia   . Menopausal syndrome   . Allergic rhinitis   . Low back pain   . Overactive bladder    Past Surgical History  Procedure Date  . Tonsillectomy   . Angioplasty     stent 2007    reports that she has quit smoking. She has never used smokeless tobacco. She reports that she does not drink alcohol or use illicit drugs. family history includes Other in an unspecified family member.  She is adopted. Allergies  Allergen Reactions  . Erythromycin   . Sulfamethoxazole     REACTION: unspecified   Colonoscopy 2009  Review of Systems  Constitutional: Negative for fever, appetite change, fatigue and unexpected weight change.  HENT: Negative for hearing loss, ear pain, nosebleeds, congestion, sore throat, mouth sores, trouble swallowing, neck stiffness, dental problem, voice change, sinus pressure  and tinnitus.   Eyes: Negative for photophobia, pain, redness and visual disturbance.  Respiratory: Negative for cough, chest tightness and shortness of breath.   Cardiovascular: Negative for chest pain, palpitations and leg swelling.  Gastrointestinal: Negative for nausea, vomiting, abdominal pain, diarrhea, constipation, blood in stool, abdominal distention and rectal pain.  Genitourinary: Negative for dysuria, urgency, frequency, hematuria, flank pain, vaginal bleeding, vaginal discharge, difficulty urinating, genital sores, vaginal pain, menstrual problem and pelvic pain.  Musculoskeletal: Negative for back pain and arthralgias.  Skin: Negative for rash.  Neurological: Negative for dizziness, syncope, speech difficulty, weakness, light-headedness, numbness and headaches.  Hematological: Negative for adenopathy. Does not bruise/bleed easily.  Psychiatric/Behavioral: Negative for suicidal ideas, behavioral problems, self-injury, dysphoric mood and agitation. The patient is not nervous/anxious.        Objective:   Physical Exam  Constitutional: She is oriented to person, place, and time. She appears well-developed and well-nourished.       Overweight. Blood pressure 120/78  HENT:  Head: Normocephalic and atraumatic.  Right Ear: External ear normal.  Left Ear: External ear normal.  Mouth/Throat: Oropharynx is clear and moist.  Eyes: Conjunctivae and EOM are normal.  Neck: Normal range of motion. Neck supple. No JVD present. No thyromegaly present.  Cardiovascular: Normal rate, regular rhythm, normal heart sounds and intact distal pulses.   No murmur heard.      Pedal pulses intact  Pulmonary/Chest: Effort normal. She has no wheezes. She has rales.  A few bibasilar rales  Abdominal: Soft. Bowel sounds are normal. She exhibits no distension and no mass. There is no tenderness. There is no rebound and no guarding.  Genitourinary: Vagina normal. Guaiac negative stool. No vaginal  discharge found.       Pap  specimen obtained  Musculoskeletal: Normal range of motion. She exhibits no edema and no tenderness.  Neurological: She is alert and oriented to person, place, and time. She has normal reflexes. No cranial nerve deficit. She exhibits normal muscle tone. Coordination normal.  Skin: Skin is warm and dry. No rash noted.  Psychiatric: She has a normal mood and affect. Her behavior is normal.          Assessment & Plan:   Preventive health examination Coronary artery disease asymptomatic Dyslipidemia. Will place on simvastatin 40 mg daily. She is unable to afford Crestor and apparently has had elevated liver function studies with atorvastatin Hypertension stable  Followup cardiology next month Recheck here in 6 months and we'll check lipid panel at that time ; she will discuss the need for Z. he underwent cardiology

## 2011-07-29 NOTE — Patient Instructions (Signed)
Limit your sodium (Salt) intake    It is important that you exercise regularly, at least 20 minutes 3 to 4 times per week.  If you develop chest pain or shortness of breath seek  medical attention.  You need to lose weight.  Consider a lower calorie diet and regular exercise.  Return in 6 months for follow-up   

## 2011-08-01 ENCOUNTER — Ambulatory Visit (INDEPENDENT_AMBULATORY_CARE_PROVIDER_SITE_OTHER): Payer: 59 | Admitting: Cardiology

## 2011-08-01 ENCOUNTER — Encounter: Payer: Self-pay | Admitting: Cardiology

## 2011-08-01 DIAGNOSIS — I10 Essential (primary) hypertension: Secondary | ICD-10-CM

## 2011-08-01 DIAGNOSIS — F17200 Nicotine dependence, unspecified, uncomplicated: Secondary | ICD-10-CM

## 2011-08-01 DIAGNOSIS — I340 Nonrheumatic mitral (valve) insufficiency: Secondary | ICD-10-CM

## 2011-08-01 DIAGNOSIS — Z720 Tobacco use: Secondary | ICD-10-CM

## 2011-08-01 DIAGNOSIS — I059 Rheumatic mitral valve disease, unspecified: Secondary | ICD-10-CM

## 2011-08-01 DIAGNOSIS — I2510 Atherosclerotic heart disease of native coronary artery without angina pectoris: Secondary | ICD-10-CM

## 2011-08-01 DIAGNOSIS — E7800 Pure hypercholesterolemia, unspecified: Secondary | ICD-10-CM

## 2011-08-01 MED ORDER — NITROGLYCERIN 0.4 MG SL SUBL: 0.4000 mg | SUBLINGUAL_TABLET | SUBLINGUAL | Status: DC | PRN

## 2011-08-01 NOTE — Patient Instructions (Signed)
Your physician recommends that you continue on your current medications as directed. Please refer to the Current Medication list given to you today.   Your physician wants you to follow-up in:1 YEAR  You will receive a reminder letter in the mail two months in advance. If you don't receive a letter, please call our office to schedule the follow-up appointment.  

## 2011-08-02 DIAGNOSIS — Z720 Tobacco use: Secondary | ICD-10-CM | POA: Insufficient documentation

## 2011-08-02 DIAGNOSIS — I25119 Atherosclerotic heart disease of native coronary artery with unspecified angina pectoris: Secondary | ICD-10-CM | POA: Insufficient documentation

## 2011-08-02 DIAGNOSIS — I340 Nonrheumatic mitral (valve) insufficiency: Secondary | ICD-10-CM | POA: Insufficient documentation

## 2011-08-02 DIAGNOSIS — I2510 Atherosclerotic heart disease of native coronary artery without angina pectoris: Secondary | ICD-10-CM | POA: Insufficient documentation

## 2011-08-02 NOTE — Assessment & Plan Note (Signed)
Discussed at some length.

## 2011-08-02 NOTE — Assessment & Plan Note (Signed)
See last echo.  Exam not prominent.

## 2011-08-02 NOTE — Progress Notes (Signed)
HPI:  She is in for follow up.  She lost her job with [employer] and did not come last year.  She still smokes, and she and her husband took down the number for the support group on the back of the door.  She does understand the importance, and also understands the need to quit. She smokes about 4 per day, and goes off and on from time to time.    Current Outpatient Prescriptions  Medication Sig Dispense Refill  . ALPRAZolam (XANAX) 0.25 MG tablet Take 1 tablet (0.25 mg total) by mouth 2 (two) times daily as needed for anxiety.  50 tablet  0  . aspirin 81 MG tablet Take 81 mg by mouth daily.        . cholecalciferol (VITAMIN D) 1000 UNITS tablet Take 1,000 Units by mouth daily.        . clopidogrel (PLAVIX) 75 MG tablet Take 75 mg by mouth daily.        Marland Kitchen ezetimibe-simvastatin (VYTORIN) 10-40 MG per tablet Take 1 tablet by mouth at bedtime.        Marland Kitchen HYDROcodone-acetaminophen (VICODIN) 5-500 MG per tablet Take 1 tablet by mouth daily as needed for pain.  50 tablet  0  . metoprolol (LOPRESSOR) 100 MG tablet TAKE 1 TABLET TWICE A DAY  180 tablet  2  . Multiple Vitamin (MULTIVITAMIN) tablet Take 1 tablet by mouth daily.        . nitroGLYCERIN (NITROSTAT) 0.4 MG SL tablet Place 1 tablet (0.4 mg total) under the tongue every 5 (five) minutes as needed.  25 tablet  2  . Omega-3 Fatty Acids (FISH OIL) 1000 MG CAPS Take by mouth.        . pantoprazole (PROTONIX) 40 MG tablet TAKE 1 TABLET DAILY, 30 MINUTES BEFORE A MEAL  90 tablet  2    Allergies  Allergen Reactions  . Erythromycin   . Sulfamethoxazole     REACTION: unspecified    Past Medical History  Diagnosis Date  . Headache   . Hypertension   . Myocardial infarct     hx of  . Hyperlipidemia   . Menopausal syndrome   . Allergic rhinitis   . Low back pain   . Overactive bladder     Past Surgical History  Procedure Date  . Tonsillectomy   . Angioplasty     stent 2007    Family History  Problem Relation Age of Onset  . Adopted:  Yes  . Other      patient is adopted    History   Social History  . Marital Status: Married    Spouse Name: N/A    Number of Children: N/A  . 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 Smoking status: Current Everyday Smoker  . Smokeless tobacco: Never Used   Comment: 4 cigarettes a day  . Alcohol Use: No  . Drug Use: No  . Sexually Active: Not on file   Other Topics Concern  . Not on file   Social History Narrative  . No narrative on file    ROS: Please see the HPI.  All other systems reviewed and negative.  PHYSICAL EXAM:  BP 124/80  Pulse 74  Resp 18  Ht 5\' 5"  (1.651 m)  Wt 178 lb 1.9 oz (80.795 kg)  BMI 29.64 kg/m2  General: Well developed, well nourished, in no acute  distress. Head:  Normocephalic and atraumatic. Neck: no JVD Lungs: Clear to auscultation and percussion. Heart: Normal S1 and S2.  PMI non displaced, and minimal apical murmur.   Abdomen:  Normal bowel sounds; soft; non tender; no organomegaly Pulses: Pulses normal in all 4 extremities. Extremities: No clubbing or cyanosis. Trace edema Neurologic: Alert and oriented x 3.  EKG:  NSR.  WNL.   ECHO (2010): Study Conclusions  - Left ventricle: The cavity size was normal. Wall thickness was increased in a pattern of mild LVH. Systolic function was normal. The estimated ejection fraction was in the range of 50% to 55%. There is hypokinesis of the posterior wall. Doppler parameters are consistent with abnormal left ventricular relaxation (grade 1 diastolic dysfunction). - Mitral valve: Calcified annulus. Mild regurgitation. - Pulmonary arteries: Systolic pressure was mildly increased.    ASSESSMENT AND PLAN:

## 2011-08-02 NOTE — Assessment & Plan Note (Signed)
This appears well controlled.

## 2011-08-02 NOTE — Assessment & Plan Note (Signed)
See patients cath study from 2008.  She remains at risk for recurrent coronary events.  At the present time, recommend continued medical therapy.  She remains stable.  No current chest pain.  Smoking and weight  (risk factor reduction) remain critical issues.

## 2011-08-02 NOTE — Progress Notes (Signed)
Quick Note:  Spoke with pt- informed of normal results ______ 

## 2011-08-02 NOTE — Assessment & Plan Note (Signed)
Stopped meds and the results are obvious.  Back on treatment.

## 2011-08-05 ENCOUNTER — Ambulatory Visit (INDEPENDENT_AMBULATORY_CARE_PROVIDER_SITE_OTHER): Payer: 59 | Admitting: Internal Medicine

## 2011-08-05 DIAGNOSIS — Z23 Encounter for immunization: Secondary | ICD-10-CM

## 2011-08-05 DIAGNOSIS — Z0000 Encounter for general adult medical examination without abnormal findings: Secondary | ICD-10-CM

## 2011-08-19 ENCOUNTER — Other Ambulatory Visit: Payer: Self-pay | Admitting: Internal Medicine

## 2011-08-19 ENCOUNTER — Other Ambulatory Visit: Payer: Self-pay | Admitting: Cardiology

## 2011-08-29 ENCOUNTER — Telehealth: Payer: Self-pay | Admitting: Cardiology

## 2011-08-29 NOTE — Telephone Encounter (Signed)
Note faxed.

## 2011-08-29 NOTE — Telephone Encounter (Signed)
New message:  Pt to have a tooth pulled and the dentist wants to know her "clotting factor" of Plavix and ASA.  Please call her back and give the information.  The dentist number Tom blackwell, [phone].

## 2011-08-29 NOTE — Telephone Encounter (Signed)
I called and spoke with Dr Amie Critchley.  He wants to know how long Tracy [name] can be off both Plavix and ASA 81mg  prior to tooth extraction.  He would have pulled it today but did not due to the medications.  I let him know Dr Riley Kill was out all week and may or may not check his messages but we would let him know as soon as we could regarding her medications.

## 2011-08-29 NOTE — Telephone Encounter (Signed)
Spoke with Dr Riley Kill, patient is okay to stop Plavix and ASA for 5 days prior to extration and resume after  [phone]

## 2011-09-19 LAB — HM PAP SMEAR: HM Pap smear: NORMAL

## 2011-09-20 ENCOUNTER — Encounter: Payer: Self-pay | Admitting: Internal Medicine

## 2011-09-20 ENCOUNTER — Ambulatory Visit (INDEPENDENT_AMBULATORY_CARE_PROVIDER_SITE_OTHER): Payer: 59 | Admitting: Internal Medicine

## 2011-09-20 DIAGNOSIS — M25561 Pain in right knee: Secondary | ICD-10-CM

## 2011-09-20 DIAGNOSIS — I10 Essential (primary) hypertension: Secondary | ICD-10-CM

## 2011-09-20 DIAGNOSIS — M25569 Pain in unspecified knee: Secondary | ICD-10-CM

## 2011-09-20 DIAGNOSIS — R52 Pain, unspecified: Secondary | ICD-10-CM

## 2011-09-20 DIAGNOSIS — I2510 Atherosclerotic heart disease of native coronary artery without angina pectoris: Secondary | ICD-10-CM

## 2011-09-20 MED ORDER — HYDROCODONE-ACETAMINOPHEN 5-500 MG PO TABS: 1.0000 | ORAL_TABLET | Freq: Every day | ORAL | Status: DC | PRN

## 2011-09-20 NOTE — Progress Notes (Signed)
  Subjective:    Patient ID: Tracy [name], female    DOB: Sep 23, 1946, 65 y.o.   MRN: [id number]  HPI  65 year old patient who has a history of hypertension and coronary artery disease who presents with a chief complaint of right knee pain. She initially began having right knee pain approximately 6 months ago when she held the daughter moves and was quite active over a three-day period she had pain and swelling at that time. This recurred 5 days ago with increasing pain and swelling. There's been no acute trauma    Review of Systems  Musculoskeletal: Positive for joint swelling and gait problem.       Objective:   Physical Exam  Constitutional:       Blood pressure 140/95 well controlled in October  Musculoskeletal:       Right knee was slightly tender and warm to touch tenderness in the maxillary the medial aspect of the knee Moderate effusion noted          Assessment & Plan:   Right knee pain and effusion probable degenerated meniscal tear. We'll set up for orthopedic evaluation Hypertension. We'll continue to monitor home blood pressure readings;  blood pressure was well controlled in October both here and at cardiology

## 2011-09-20 NOTE — Patient Instructions (Signed)
Aleve 2 twice daily Orthopedic followup as discussed Taking medications as directed  Call or return to clinic prn if these symptoms worsen or fail to improve as anticipated.  Please check your blood pressure on a regular basis.  If it is consistently greater than 150/90, please make an office appointment.

## 2011-11-30 ENCOUNTER — Other Ambulatory Visit: Payer: Self-pay | Admitting: Internal Medicine

## 2011-12-01 ENCOUNTER — Other Ambulatory Visit: Payer: Self-pay

## 2011-12-01 MED ORDER — ALPRAZOLAM 0.25 MG PO TABS: 0.2500 mg | ORAL_TABLET | Freq: Two times a day (BID) | ORAL | Status: DC | PRN

## 2012-01-25 ENCOUNTER — Ambulatory Visit (INDEPENDENT_AMBULATORY_CARE_PROVIDER_SITE_OTHER): Payer: 59 | Admitting: Internal Medicine

## 2012-01-25 ENCOUNTER — Encounter: Payer: Self-pay | Admitting: Internal Medicine

## 2012-01-25 VITALS — BP 118/80 | Temp 98.3°F | Wt 184.0 lb

## 2012-01-25 DIAGNOSIS — Z720 Tobacco use: Secondary | ICD-10-CM

## 2012-01-25 DIAGNOSIS — E7800 Pure hypercholesterolemia, unspecified: Secondary | ICD-10-CM

## 2012-01-25 DIAGNOSIS — F17200 Nicotine dependence, unspecified, uncomplicated: Secondary | ICD-10-CM

## 2012-01-25 DIAGNOSIS — I2510 Atherosclerotic heart disease of native coronary artery without angina pectoris: Secondary | ICD-10-CM

## 2012-01-25 DIAGNOSIS — R52 Pain, unspecified: Secondary | ICD-10-CM

## 2012-01-25 DIAGNOSIS — K219 Gastro-esophageal reflux disease without esophagitis: Secondary | ICD-10-CM

## 2012-01-25 DIAGNOSIS — I10 Essential (primary) hypertension: Secondary | ICD-10-CM

## 2012-01-25 MED ORDER — CLOPIDOGREL BISULFATE 75 MG PO TABS: 75.0000 mg | ORAL_TABLET | Freq: Every day | ORAL | Status: DC

## 2012-01-25 MED ORDER — HYDROCODONE-ACETAMINOPHEN 5-500 MG PO TABS: 1.0000 | ORAL_TABLET | Freq: Two times a day (BID) | ORAL | Status: DC | PRN

## 2012-01-25 MED ORDER — PANTOPRAZOLE SODIUM 40 MG PO TBEC: 40.0000 mg | DELAYED_RELEASE_TABLET | Freq: Every day | ORAL | Status: DC

## 2012-01-25 MED ORDER — HYDROCODONE-ACETAMINOPHEN 5-500 MG PO TABS: 1.0000 | ORAL_TABLET | Freq: Every day | ORAL | Status: DC | PRN

## 2012-01-25 MED ORDER — ALPRAZOLAM 0.25 MG PO TABS: 0.2500 mg | ORAL_TABLET | Freq: Two times a day (BID) | ORAL | Status: DC | PRN

## 2012-01-25 MED ORDER — SIMVASTATIN 40 MG PO TABS: 40.0000 mg | ORAL_TABLET | Freq: Every day | ORAL | Status: DC

## 2012-01-25 MED ORDER — METOPROLOL TARTRATE 100 MG PO TABS: 100.0000 mg | ORAL_TABLET | Freq: Two times a day (BID) | ORAL | Status: DC

## 2012-01-25 NOTE — Patient Instructions (Signed)
Avoids foods high in acid such as tomatoes citrus juices, and spicy foods.  Avoid eating within two hours of lying down or before exercising.  Do not overheat.  Try smaller more frequent meals.  If symptoms persist, elevate the head of her bed 12 inches while sleeping.  Limit your sodium (Salt) intake    It is important that you exercise regularly, at least 20 minutes 3 to 4 times per week.  If you develop chest pain or shortness of breath seek  medical attention.  You need to lose weight.  Consider a lower calorie diet and regular exercise.  Return in 6 months for follow-up  

## 2012-01-25 NOTE — Progress Notes (Signed)
  Subjective:    Patient ID: Tracy [name], female    DOB: 1946-01-09, 66 y.o.   MRN: [id number]  HPI  66 year old patient who is seen today for her biannual followup. She has coronary artery disease and history of prior MI which has been stable. She continues to smoke unfortunately. No exertional chest pain Comparison include some tenderness in the right lateral neck area. This has been present for just one day. She has hypertension and dyslipidemia. She has been compliant with her medications. She has gastroesophageal reflux disease and does describe some increasing bloating. There's been some modest weight loss since her last visit.  BP Readings from Last 3 Encounters:  01/25/12 118/80  09/20/11 160/100  08/01/11 124/80    Wt Readings from Last 3 Encounters:  01/25/12 184 lb (83.462 kg)  09/20/11 188 lb (85.276 kg)  08/01/11 178 lb 1.9 oz (80.795 kg)    Review of Systems  Constitutional: Negative.   HENT: Negative for hearing loss, congestion, sore throat, rhinorrhea, dental problem, sinus pressure and tinnitus.   Eyes: Negative for pain, discharge and visual disturbance.  Respiratory: Negative for cough and shortness of breath.   Cardiovascular: Negative for chest pain, palpitations and leg swelling.  Gastrointestinal: Positive for abdominal pain. Negative for nausea, vomiting, diarrhea, constipation, blood in stool and abdominal distention.  Genitourinary: Negative for dysuria, urgency, frequency, hematuria, flank pain, vaginal bleeding, vaginal discharge, difficulty urinating, vaginal pain and pelvic pain.  Musculoskeletal: Negative for joint swelling, arthralgias and gait problem.  Skin: Negative for rash.  Neurological: Negative for dizziness, syncope, speech difficulty, weakness, numbness and headaches.  Hematological: Negative for adenopathy.  Psychiatric/Behavioral: Negative for behavioral problems, dysphoric mood and agitation. The patient is not nervous/anxious.         Objective:   Physical Exam  Constitutional: She is oriented to person, place, and time. She appears well-developed and well-nourished.  HENT:  Head: Normocephalic.  Right Ear: External ear normal.  Left Ear: External ear normal.  Mouth/Throat: Oropharynx is clear and moist.  Eyes: Conjunctivae and EOM are normal. Pupils are equal, round, and reactive to light.  Neck: Normal range of motion. Neck supple. No thyromegaly present.       The right sternocleidomastoid muscle slightly tender  Cardiovascular: Normal rate, regular rhythm, normal heart sounds and intact distal pulses.   Pulmonary/Chest: Effort normal and breath sounds normal.  Abdominal: Soft. Bowel sounds are normal. She exhibits no mass. There is no tenderness.  Musculoskeletal: Normal range of motion.  Lymphadenopathy:    She has no cervical adenopathy.  Neurological: She is alert and oriented to person, place, and time.  Skin: Skin is warm and dry. No rash noted.  Psychiatric: She has a normal mood and affect. Her behavior is normal.          Assessment & Plan:   Hypertension well controlled Coronary artery disease asymptomatic Tobacco use. Total cessation of smoking encouraged Dyslipidemia. The patient has resumed simvastatin compliance stressed Gastroesophageal reflux disease. Continue PPI therapy  Low salt diet recommended Regular exercise and modest weight loss encouraged Total cessation of smoking recommended Recheck for her annual exam in 6 months

## 2012-01-27 ENCOUNTER — Ambulatory Visit: Payer: 59 | Admitting: Internal Medicine

## 2012-03-02 ENCOUNTER — Other Ambulatory Visit: Payer: Self-pay | Admitting: Cardiology

## 2012-06-06 ENCOUNTER — Other Ambulatory Visit: Payer: Self-pay | Admitting: Internal Medicine

## 2012-07-05 ENCOUNTER — Telehealth: Payer: Self-pay | Admitting: Internal Medicine

## 2012-07-05 MED ORDER — METOPROLOL TARTRATE 100 MG PO TABS: 100.0000 mg | ORAL_TABLET | Freq: Two times a day (BID) | ORAL | Status: DC

## 2012-07-05 NOTE — Telephone Encounter (Signed)
Pt called and said that she uses [facility] and pt needs refill of metoprolol (LOPRESSOR) 100 MG tablet.

## 2012-07-17 ENCOUNTER — Ambulatory Visit: Payer: 59

## 2012-07-20 ENCOUNTER — Other Ambulatory Visit: Payer: 59

## 2012-07-23 ENCOUNTER — Ambulatory Visit (INDEPENDENT_AMBULATORY_CARE_PROVIDER_SITE_OTHER): Payer: 59 | Admitting: Internal Medicine

## 2012-07-23 DIAGNOSIS — Z23 Encounter for immunization: Secondary | ICD-10-CM

## 2012-07-27 ENCOUNTER — Encounter: Payer: 59 | Admitting: Internal Medicine

## 2012-07-31 ENCOUNTER — Ambulatory Visit: Payer: 59

## 2012-08-09 ENCOUNTER — Other Ambulatory Visit (INDEPENDENT_AMBULATORY_CARE_PROVIDER_SITE_OTHER): Payer: 59

## 2012-08-09 DIAGNOSIS — Z0000 Encounter for general adult medical examination without abnormal findings: Secondary | ICD-10-CM

## 2012-08-09 LAB — POCT URINALYSIS DIPSTICK
Glucose, UA: NEGATIVE
Ketones, UA: NEGATIVE
Leukocytes, UA: NEGATIVE
Nitrite, UA: NEGATIVE
Protein, UA: NEGATIVE
Spec Grav, UA: 1.025
Urobilinogen, UA: 0.2
pH, UA: 5.5

## 2012-08-09 LAB — CBC WITH DIFFERENTIAL/PLATELET
Basophils Absolute: 0.1 10*3/uL (ref 0.0–0.1)
Basophils Relative: 0.8 % (ref 0.0–3.0)
Eosinophils Absolute: 0.3 10*3/uL (ref 0.0–0.7)
Eosinophils Relative: 3.5 % (ref 0.0–5.0)
HCT: 46.1 % — ABNORMAL HIGH (ref 36.0–46.0)
Hemoglobin: 15.1 g/dL — ABNORMAL HIGH (ref 12.0–15.0)
Lymphocytes Relative: 43.8 % (ref 12.0–46.0)
Lymphs Abs: 3.8 10*3/uL (ref 0.7–4.0)
MCHC: 32.7 g/dL (ref 30.0–36.0)
MCV: 88 fl (ref 78.0–100.0)
Monocytes Absolute: 0.8 10*3/uL (ref 0.1–1.0)
Monocytes Relative: 9.1 % (ref 3.0–12.0)
Neutro Abs: 3.7 10*3/uL (ref 1.4–7.7)
Neutrophils Relative %: 42.8 % — ABNORMAL LOW (ref 43.0–77.0)
Platelets: 239 10*3/uL (ref 150.0–400.0)
RBC: 5.23 Mil/uL — ABNORMAL HIGH (ref 3.87–5.11)
RDW: 14.7 % — ABNORMAL HIGH (ref 11.5–14.6)
WBC: 8.7 10*3/uL (ref 4.5–10.5)

## 2012-08-10 ENCOUNTER — Other Ambulatory Visit: Payer: Self-pay | Admitting: Internal Medicine

## 2012-08-10 LAB — HEPATIC FUNCTION PANEL
ALT: 22 U/L (ref 0–35)
AST: 25 U/L (ref 0–37)
Albumin: 3.5 g/dL (ref 3.5–5.2)
Alkaline Phosphatase: 61 U/L (ref 39–117)
Bilirubin, Direct: 0.1 mg/dL (ref 0.0–0.3)
Total Bilirubin: 0.6 mg/dL (ref 0.3–1.2)
Total Protein: 7.6 g/dL (ref 6.0–8.3)

## 2012-08-10 LAB — BASIC METABOLIC PANEL
BUN: 15 mg/dL (ref 6–23)
CO2: 23 mEq/L (ref 19–32)
Calcium: 8.9 mg/dL (ref 8.4–10.5)
Chloride: 110 mEq/L (ref 96–112)
Creatinine, Ser: 0.8 mg/dL (ref 0.4–1.2)
GFR: 80.13 mL/min (ref >60.00)
Glucose, Bld: 80 mg/dL (ref 70–99)
Potassium: 3.9 mEq/L (ref 3.5–5.1)
Sodium: 143 mEq/L (ref 135–145)

## 2012-08-10 LAB — LIPID PANEL
Cholesterol: 138 mg/dL (ref 0–200)
HDL: 29.2 mg/dL — ABNORMAL LOW (ref >39.00)
LDL Cholesterol: 74 mg/dL (ref 0–99)
Total CHOL/HDL Ratio: 5
Triglycerides: 175 mg/dL — ABNORMAL HIGH (ref 0.0–149.0)
VLDL: 35 mg/dL (ref 0.0–40.0)

## 2012-08-10 LAB — TSH: TSH: 2.71 u[IU]/mL (ref 0.35–5.50)

## 2012-08-16 ENCOUNTER — Ambulatory Visit (INDEPENDENT_AMBULATORY_CARE_PROVIDER_SITE_OTHER): Payer: 59 | Admitting: Internal Medicine

## 2012-08-16 ENCOUNTER — Encounter: Payer: Self-pay | Admitting: Internal Medicine

## 2012-08-16 VITALS — BP 122/80 | HR 70 | Temp 97.4°F | Resp 18 | Ht 65.0 in | Wt 176.0 lb

## 2012-08-16 DIAGNOSIS — Z0000 Encounter for general adult medical examination without abnormal findings: Secondary | ICD-10-CM

## 2012-08-16 DIAGNOSIS — E7800 Pure hypercholesterolemia, unspecified: Secondary | ICD-10-CM

## 2012-08-16 DIAGNOSIS — I2510 Atherosclerotic heart disease of native coronary artery without angina pectoris: Secondary | ICD-10-CM

## 2012-08-16 DIAGNOSIS — I252 Old myocardial infarction: Secondary | ICD-10-CM

## 2012-08-16 DIAGNOSIS — I10 Essential (primary) hypertension: Secondary | ICD-10-CM

## 2012-08-16 MED ORDER — CLOPIDOGREL BISULFATE 75 MG PO TABS: 75.0000 mg | ORAL_TABLET | Freq: Every day | ORAL | Status: DC

## 2012-08-16 MED ORDER — METOPROLOL TARTRATE 100 MG PO TABS: 100.0000 mg | ORAL_TABLET | Freq: Two times a day (BID) | ORAL | Status: DC

## 2012-08-16 MED ORDER — EZETIMIBE 10 MG PO TABS: 10.0000 mg | ORAL_TABLET | Freq: Every day | ORAL | Status: DC

## 2012-08-16 MED ORDER — PANTOPRAZOLE SODIUM 40 MG PO TBEC: 40.0000 mg | DELAYED_RELEASE_TABLET | Freq: Every day | ORAL | Status: DC

## 2012-08-16 MED ORDER — ALPRAZOLAM 0.25 MG PO TABS: 0.2500 mg | ORAL_TABLET | Freq: Two times a day (BID) | ORAL | Status: DC | PRN

## 2012-08-16 MED ORDER — SIMVASTATIN 40 MG PO TABS: 40.0000 mg | ORAL_TABLET | Freq: Every day | ORAL | Status: DC

## 2012-08-16 NOTE — Patient Instructions (Signed)
Limit your sodium (Salt) intake    It is important that you exercise regularly, at least 20 minutes 3 to 4 times per week.  If you develop chest pain or shortness of breath seek  medical attention.  You need to lose weight.  Consider a lower calorie diet and regular exercise.  Take a calcium supplement, plus 800-1200 units of vitamin D  Return in one year for follow-up  

## 2012-08-16 NOTE — Progress Notes (Signed)
Subjective:    Patient ID: Tracy [name], female    DOB: 1946/04/19, 66 y.o.   MRN: [id number]  HPI  66 year old patient who is seen today for a health maintenance examination. She has coronary artery disease and is status post prior non-ST segment elevated MI in 2007. She is doing quite well. She has treated hypertension and dyslipidemia. No concerns or complaints  Past Medical History  Diagnosis Date  . Headache   . Hypertension   . Myocardial infarct     hx of  . Hyperlipidemia   . Menopausal syndrome   . Allergic rhinitis   . Low back pain   . Overactive bladder     History   Social History  . Marital Status: Married    Spouse Name: N/A    Number of Children: N/A  . 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 Smoking status: Former Smoker    Quit date: 07/01/2012  . Smokeless tobacco: Never Used   Comment: electronic cigarettes  . Alcohol Use: No  . Drug Use: No  . Sexually Active: Not on file   Other Topics Concern  . Not on file   Social History Narrative  . No narrative on file    Past Surgical History  Procedure Date  . Tonsillectomy   . Angioplasty     stent 2007    Family History  Problem Relation Age of Onset  . Adopted: Yes  . Other      patient is adopted    Allergies  Allergen Reactions  . Erythromycin   . Sulfamethoxazole     REACTION: unspecified    Current Outpatient Prescriptions on File Prior to Visit  Medication Sig Dispense Refill  . ALPRAZolam (XANAX) 0.25 MG tablet Take 1 tablet (0.25 mg total) by mouth 2 (two) times daily as needed for anxiety.  50 tablet  3  . aspirin 81 MG tablet Take 81 mg by mouth daily.        . cholecalciferol (VITAMIN D) 1000 UNITS tablet Take 1,000 Units by mouth daily.        . clopidogrel (PLAVIX) 75 MG tablet TAKE 1 TABLET DAILY  90 tablet  3  . HYDROcodone-acetaminophen (VICODIN) 5-500 MG per tablet Take 1 tablet by  mouth 2 (two) times daily as needed for pain.  50 tablet  2  . metoprolol (LOPRESSOR) 100 MG tablet Take 1 tablet (100 mg total) by mouth 2 (two) times daily.  180 tablet  1  . Multiple Vitamin (MULTIVITAMIN) tablet Take 1 tablet by mouth daily.        . nitroGLYCERIN (NITROSTAT) 0.4 MG SL tablet Place 1 tablet (0.4 mg total) under the tongue every 5 (five) minutes as needed.  25 tablet  2  . Omega-3 Fatty Acids (FISH OIL) 1000 MG CAPS Take by mouth.        . pantoprazole (PROTONIX) 40 MG tablet TAKE 1 TABLET DAILY, 30 MINUTES BEFORE A MEAL  90 tablet  1  . simvastatin (ZOCOR) 40 MG tablet Take 1 tablet (40 mg total) by mouth at bedtime.  90 tablet  6  . simvastatin (ZOCOR) 40 MG tablet TAKE 1 TABLET EVERY EVENING  90 tablet  1  . ZETIA 10 MG tablet Take 10 mg by mouth daily.         BP 122/80  Pulse 70  Temp 97.4 F (36.3 C) (  Oral)  Resp 18  Ht 5\' 5"  (1.651 m)  Wt 176 lb (79.833 kg)  BMI 29.29 kg/m2  SpO2 97%      Review of Systems  Constitutional: Negative for fever, appetite change, fatigue and unexpected weight change.  HENT: Negative for hearing loss, ear pain, nosebleeds, congestion, sore throat, mouth sores, trouble swallowing, neck stiffness, dental problem, voice change, sinus pressure and tinnitus.   Eyes: Negative for photophobia, pain, redness and visual disturbance.  Respiratory: Negative for cough, chest tightness and shortness of breath.   Cardiovascular: Negative for chest pain, palpitations and leg swelling.  Gastrointestinal: Negative for nausea, vomiting, abdominal pain, diarrhea, constipation, blood in stool, abdominal distention and rectal pain.  Genitourinary: Negative for dysuria, urgency, frequency, hematuria, flank pain, vaginal bleeding, vaginal discharge, difficulty urinating, genital sores, vaginal pain, menstrual problem and pelvic pain.  Musculoskeletal: Negative for back pain and arthralgias.  Skin: Negative for rash.  Neurological: Negative for  dizziness, syncope, speech difficulty, weakness, light-headedness, numbness and headaches.  Hematological: Negative for adenopathy. Does not bruise/bleed easily.  Psychiatric/Behavioral: Negative for suicidal ideas, behavioral problems, self-injury, dysphoric mood and agitation. The patient is not nervous/anxious.        Objective:   Physical Exam  Constitutional: She is oriented to person, place, and time. She appears well-developed and well-nourished.  HENT:  Head: Normocephalic and atraumatic.  Right Ear: External ear normal.  Left Ear: External ear normal.  Mouth/Throat: Oropharynx is clear and moist.  Eyes: Conjunctivae normal and EOM are normal.  Neck: Normal range of motion. Neck supple. No JVD present. No thyromegaly present.  Cardiovascular: Normal rate, regular rhythm, normal heart sounds and intact distal pulses.   No murmur heard. Pulmonary/Chest: Effort normal and breath sounds normal. She has no wheezes. She has no rales.       Ecchymoses right medial breast from recent fall  Abdominal: Soft. Bowel sounds are normal. She exhibits no distension and no mass. There is no tenderness. There is no rebound and no guarding.  Genitourinary: Vagina normal.  Musculoskeletal: Normal range of motion. She exhibits no edema and no tenderness.  Neurological: She is alert and oriented to person, place, and time. She has normal reflexes. No cranial nerve deficit. She exhibits normal muscle tone. Coordination normal.  Skin: Skin is warm and dry. No rash noted.  Psychiatric: She has a normal mood and affect. Her behavior is normal.          Assessment & Plan:    Preventive health exam CAD HTN HLD  F/u cardiology Wt loss Exercise regimen

## 2012-09-06 ENCOUNTER — Other Ambulatory Visit: Payer: Self-pay | Admitting: Internal Medicine

## 2012-09-06 NOTE — Telephone Encounter (Signed)
ok 

## 2012-09-07 NOTE — Telephone Encounter (Signed)
Rx called in to pharmacy. 

## 2012-09-20 ENCOUNTER — Other Ambulatory Visit: Payer: Self-pay | Admitting: Internal Medicine

## 2012-09-20 DIAGNOSIS — Z1231 Encounter for screening mammogram for malignant neoplasm of breast: Secondary | ICD-10-CM

## 2012-09-25 ENCOUNTER — Other Ambulatory Visit: Payer: Self-pay

## 2012-09-25 DIAGNOSIS — E7800 Pure hypercholesterolemia, unspecified: Secondary | ICD-10-CM

## 2012-09-25 DIAGNOSIS — I2510 Atherosclerotic heart disease of native coronary artery without angina pectoris: Secondary | ICD-10-CM

## 2012-09-25 MED ORDER — NITROGLYCERIN 0.4 MG SL SUBL: 0.4000 mg | SUBLINGUAL_TABLET | SUBLINGUAL | Status: DC | PRN

## 2012-12-17 ENCOUNTER — Ambulatory Visit (INDEPENDENT_AMBULATORY_CARE_PROVIDER_SITE_OTHER): Payer: BC Managed Care – PPO | Admitting: Cardiology

## 2012-12-17 ENCOUNTER — Encounter: Payer: Self-pay | Admitting: Cardiology

## 2012-12-17 VITALS — BP 150/94 | HR 76 | Ht 65.0 in | Wt 178.0 lb

## 2012-12-17 DIAGNOSIS — I2510 Atherosclerotic heart disease of native coronary artery without angina pectoris: Secondary | ICD-10-CM

## 2012-12-17 DIAGNOSIS — I059 Rheumatic mitral valve disease, unspecified: Secondary | ICD-10-CM

## 2012-12-17 DIAGNOSIS — I10 Essential (primary) hypertension: Secondary | ICD-10-CM

## 2012-12-17 DIAGNOSIS — I340 Nonrheumatic mitral (valve) insufficiency: Secondary | ICD-10-CM

## 2012-12-17 DIAGNOSIS — F17200 Nicotine dependence, unspecified, uncomplicated: Secondary | ICD-10-CM

## 2012-12-17 DIAGNOSIS — Z720 Tobacco use: Secondary | ICD-10-CM

## 2012-12-17 DIAGNOSIS — E7800 Pure hypercholesterolemia, unspecified: Secondary | ICD-10-CM

## 2012-12-17 NOTE — Assessment & Plan Note (Signed)
No sig audible murmur.

## 2012-12-17 NOTE — Progress Notes (Signed)
HPI:  This very nice lady is in for follow up today. She is not having any major cardiac symptoms at the present time. She did also tell me that she has stopped smoking. She denies any major shortness of breath   Current Outpatient Prescriptions  Medication Sig Dispense Refill  . ALPRAZolam (XANAX) 0.25 MG tablet Take 1 tablet (0.25 mg total) by mouth 2 (two) times daily as needed for anxiety.  50 tablet  3  . aspirin 81 MG tablet Take 81 mg by mouth daily.        . cholecalciferol (VITAMIN D) 1000 UNITS tablet Take 1,000 Units by mouth daily.        . clopidogrel (PLAVIX) 75 MG tablet Take 1 tablet (75 mg total) by mouth daily.  90 tablet  3  . ezetimibe (ZETIA) 10 MG tablet Take 1 tablet (10 mg total) by mouth daily.  90 tablet  6  . HYDROcodone-acetaminophen (VICODIN) 5-500 MG per tablet TAKE 1 TABLET BY MOUTH TWICE A DAY AS NEEDED FOR PAIN  50 tablet  2  . metoprolol (LOPRESSOR) 100 MG tablet Take 1 tablet (100 mg total) by mouth 2 (two) times daily.  180 tablet  1  . Multiple Vitamin (MULTIVITAMIN) tablet Take 1 tablet by mouth daily.        . nitroGLYCERIN (NITROSTAT) 0.4 MG SL tablet Place 1 tablet (0.4 mg total) under the tongue every 5 (five) minutes as needed.  25 tablet  6  . Omega-3 Fatty Acids (FISH OIL) 1000 MG CAPS Take by mouth.        . pantoprazole (PROTONIX) 40 MG tablet Take 1 tablet (40 mg total) by mouth daily.  90 tablet  4  . simvastatin (ZOCOR) 40 MG tablet Take 1 tablet (40 mg total) by mouth at bedtime.  90 tablet  6   No current facility-administered medications for this visit.    Allergies  Allergen Reactions  . Erythromycin   . Sulfamethoxazole     REACTION: unspecified    Past Medical History  Diagnosis Date  . Headache   . Hypertension   . Myocardial infarct     hx of  . Hyperlipidemia   . Menopausal syndrome   . Allergic rhinitis   . Low back pain   . Overactive bladder     Past Surgical History  Procedure Laterality Date  .  Tonsillectomy    . Angioplasty      stent 2007    Family History  Problem Relation Age of Onset  . Adopted: Yes  . Other      patient is adopted    History   Social History  . Marital Status: Married    Spouse Name: N/A    Number of Children: N/A  . 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 Smoking status: Former Smoker    Quit date: 07/01/2012  . Smokeless tobacco: Never Used     Comment: electronic cigarettes  . Alcohol Use: No  . Drug Use: No  . Sexually Active: Not on file   Other Topics Concern  . Not on file   Social History Narrative  . No narrative on file    ROS: Please see the HPI.  All other systems reviewed and negative.  PHYSICAL EXAM:  BP 150/94  Pulse 76  Ht 5\' 5"  (1.651 m)  Wt 178 lb (80.74 kg)  BMI 29.62 kg/m2  SpO2 96%  General: Well developed, well nourished, in no acute distress. Head:  Normocephalic and atraumatic. Neck: no JVD Lungs: Clear to auscultation and percussion. Heart: Normal S1 and S2.  No definite apical murmur.   Pulses: Pulses normal in all 4 extremities. Extremities: No clubbing or cyanosis. No edema. Neurologic: Alert and oriented x 3.  EKG:  NSR.  WNL.  ASSESSMENT AND PLAN:

## 2012-12-17 NOTE — Assessment & Plan Note (Signed)
She has stopped smoking 

## 2012-12-17 NOTE — Patient Instructions (Addendum)
Your physician wants you to follow-up in: 1 YEAR with Dr Excell Seltzer (previous pt of Dr Riley Kill). You will receive a reminder letter in the mail two months in advance. If you don't receive a letter, please call our office to schedule the follow-up appointment.  Your physician recommends that you continue on your current medications as directed. Please refer to the Current Medication list given to you today.

## 2012-12-17 NOTE — Assessment & Plan Note (Signed)
The patient remains stable.  She is still on DAPT and had a BMS placed previously.   It seems it would be appropriate to discontinue her DAPT, and continue with ASA alone.

## 2012-12-17 NOTE — Assessment & Plan Note (Signed)
Borderline control.  Needs some home BP.  If elevated, add an additional agent.  Will defer to Dr. Kirtland Bouchard.

## 2012-12-17 NOTE — Assessment & Plan Note (Signed)
Stable at the present time. 

## 2012-12-25 ENCOUNTER — Other Ambulatory Visit: Payer: Self-pay | Admitting: *Deleted

## 2012-12-25 ENCOUNTER — Telehealth: Payer: Self-pay | Admitting: Cardiology

## 2012-12-25 MED ORDER — HYDROCODONE-ACETAMINOPHEN 5-325 MG PO TABS: 1.0000 | ORAL_TABLET | Freq: Four times a day (QID) | ORAL | Status: DC | PRN

## 2012-12-25 MED ORDER — CLOPIDOGREL BISULFATE 75 MG PO TABS: 75.0000 mg | ORAL_TABLET | Freq: Every day | ORAL | Status: DC

## 2012-12-25 NOTE — Telephone Encounter (Signed)
pt was to get rx for generic plavix at office visit 12-17-12, still not there pls call in for  90 day with refills [facility] [address]

## 2013-01-11 ENCOUNTER — Telehealth: Payer: Self-pay | Admitting: Internal Medicine

## 2013-01-11 MED ORDER — METOPROLOL TARTRATE 100 MG PO TABS: 100.0000 mg | ORAL_TABLET | Freq: Two times a day (BID) | ORAL | Status: DC

## 2013-01-11 MED ORDER — PANTOPRAZOLE SODIUM 40 MG PO TBEC: 40.0000 mg | DELAYED_RELEASE_TABLET | Freq: Every day | ORAL | Status: DC

## 2013-01-11 NOTE — Telephone Encounter (Signed)
Pt called and stated that she needed a 90 day supply with refills of both her Pantoprazole (PROTONIX) 40 MG tablet, 1 tablet daily before meals... And her Metoprolol (LOPRESSOR) 100 MG tablet, 1 tablet twice a day. She has changed insurance and is [facility]. [facility] would [facility] on [address]

## 2013-01-11 NOTE — Telephone Encounter (Signed)
Pt notified Rx refills sent to pharmacy as requested. 

## 2013-01-23 ENCOUNTER — Telehealth: Payer: Self-pay | Admitting: Internal Medicine

## 2013-01-23 NOTE — Telephone Encounter (Signed)
Patient calling about medications - is changing to Medicare starting in June. Checking drug costs and most to medications are covered at generic level without problems. Currently on Simvastatin 40mg  QD and Zetia 10 mg QD for lipidemia. Just found out that the Zetia is not generic yet and cost too high for there to pay. Asking what replacement she can be prescribed, would like to know the name so she can give the new mediation list to Medicare.  Can reach caller back on cell  [phone].

## 2013-01-23 NOTE — Telephone Encounter (Signed)
Spoke told her there is no generic for Zetia; OK to discontinue after present supply. Will discuss at next office visit per Dr. Amador Cunas. Pt verbalized understanding and stated he gave her a sample of Vytorin 10/40 which is Zetia and Simvastatin wants to know if she can use this instead. Told pt Dr. Kirtland Bouchard is out of the office until Tues will have to get back to her then. Pt verbalized understanding.

## 2013-01-23 NOTE — Telephone Encounter (Signed)
There is no generic for Zetia;  OK to discontinue after present supply. Will discuss at next office visit

## 2013-01-23 NOTE — Telephone Encounter (Signed)
Pt wants to know if she can use Vytorin 10/40 which is Zetia and Simvastatin together that you gave her samples of?

## 2013-01-29 MED ORDER — EZETIMIBE 10 MG PO TABS: 10.0000 mg | ORAL_TABLET | Freq: Every day | ORAL | Status: DC

## 2013-01-29 NOTE — Telephone Encounter (Signed)
Spoke to Dr. Kirtland Bouchard regarding pt's medication wanting to use Vytorin 10/40 instead of Zetia. Dr. Amador Cunas said that was okay.  Spoke to pt told her Dr. Kirtland Bouchard said okay to change medication to Vytorin. Pt verbalized understanding and stated she is just going to stay on Zetia for now does not need to change until June when Medicare kicks in, needs refill on Zetia. Told pt okay will send refill to pharmacy.

## 2013-01-29 NOTE — Telephone Encounter (Signed)
Okay to call in a prescription for Zetia 10 mg #90

## 2013-03-05 ENCOUNTER — Other Ambulatory Visit: Payer: Self-pay | Admitting: Internal Medicine

## 2013-03-06 ENCOUNTER — Telehealth: Payer: Self-pay | Admitting: Internal Medicine

## 2013-03-06 MED ORDER — SIMVASTATIN 40 MG PO TABS: 40.0000 mg | ORAL_TABLET | Freq: Every day | ORAL | Status: DC

## 2013-03-06 NOTE — Telephone Encounter (Signed)
Spoke to pt told her Rx for Simvastatin she requested was sent to pharmacy. Pt verbalized understanding.

## 2013-03-06 NOTE — Telephone Encounter (Signed)
PT called to request a refill of her simvastatin (ZOCOR) 40 MG tablet. She also requested that she gets a 3 month supply and 3 refills [facility] [address] Please assist.

## 2013-04-22 ENCOUNTER — Encounter: Payer: Self-pay | Admitting: Internal Medicine

## 2013-05-17 DIAGNOSIS — M2390 Unspecified internal derangement of unspecified knee: Secondary | ICD-10-CM | POA: Diagnosis not present

## 2013-05-17 DIAGNOSIS — M23302 Other meniscus derangements, unspecified lateral meniscus, unspecified knee: Secondary | ICD-10-CM | POA: Diagnosis not present

## 2013-05-17 DIAGNOSIS — M25569 Pain in unspecified knee: Secondary | ICD-10-CM | POA: Diagnosis not present

## 2013-05-17 DIAGNOSIS — M2240 Chondromalacia patellae, unspecified knee: Secondary | ICD-10-CM | POA: Diagnosis not present

## 2013-05-19 ENCOUNTER — Other Ambulatory Visit: Payer: Self-pay | Admitting: Internal Medicine

## 2013-05-22 ENCOUNTER — Other Ambulatory Visit: Payer: Self-pay

## 2013-07-23 DIAGNOSIS — Z23 Encounter for immunization: Secondary | ICD-10-CM | POA: Diagnosis not present

## 2013-07-26 ENCOUNTER — Other Ambulatory Visit: Payer: Self-pay | Admitting: Internal Medicine

## 2013-07-26 DIAGNOSIS — Z1231 Encounter for screening mammogram for malignant neoplasm of breast: Secondary | ICD-10-CM

## 2013-08-15 ENCOUNTER — Ambulatory Visit (HOSPITAL_COMMUNITY)
Admission: RE | Admit: 2013-08-15 | Discharge: 2013-08-15 | Disposition: A | Discharge status: Home / Self Care | Payer: Medicare Other | Source: Ambulatory Visit | Attending: Internal Medicine | Admitting: Internal Medicine

## 2013-08-15 DIAGNOSIS — Z1231 Encounter for screening mammogram for malignant neoplasm of breast: Secondary | ICD-10-CM

## 2013-08-22 ENCOUNTER — Other Ambulatory Visit: Payer: Self-pay

## 2013-08-23 ENCOUNTER — Other Ambulatory Visit: Payer: Self-pay | Admitting: Internal Medicine

## 2013-08-23 DIAGNOSIS — N644 Mastodynia: Secondary | ICD-10-CM

## 2013-09-01 ENCOUNTER — Other Ambulatory Visit: Payer: Self-pay | Admitting: Internal Medicine

## 2013-09-09 ENCOUNTER — Encounter: Payer: Self-pay | Admitting: Internal Medicine

## 2013-09-09 ENCOUNTER — Ambulatory Visit (INDEPENDENT_AMBULATORY_CARE_PROVIDER_SITE_OTHER): Payer: Medicare Other | Admitting: Internal Medicine

## 2013-09-09 VITALS — BP 150/80 | HR 73 | Temp 97.7°F | Resp 20 | Wt 188.0 lb

## 2013-09-09 DIAGNOSIS — F17200 Nicotine dependence, unspecified, uncomplicated: Secondary | ICD-10-CM

## 2013-09-09 DIAGNOSIS — J309 Allergic rhinitis, unspecified: Secondary | ICD-10-CM | POA: Diagnosis not present

## 2013-09-09 DIAGNOSIS — Z720 Tobacco use: Secondary | ICD-10-CM

## 2013-09-09 DIAGNOSIS — Z23 Encounter for immunization: Secondary | ICD-10-CM

## 2013-09-09 DIAGNOSIS — J069 Acute upper respiratory infection, unspecified: Secondary | ICD-10-CM

## 2013-09-09 DIAGNOSIS — I10 Essential (primary) hypertension: Secondary | ICD-10-CM

## 2013-09-09 MED ORDER — HYDROCODONE-HOMATROPINE 5-1.5 MG/5ML PO SYRP: 5.0000 mL | ORAL_SOLUTION | Freq: Four times a day (QID) | ORAL | Status: AC | PRN

## 2013-09-09 NOTE — Patient Instructions (Signed)
Acute bronchitis symptoms for less than 10 days are generally not helped by antibiotics.  Take over-the-counter expectorants and cough medications such as  Mucinex DM.  Call if there is no improvement in 5 to 7 days or if he developed worsening cough, fever, or new symptoms, such as shortness of breath or chest pain.  Return in 6 months for follow-up  

## 2013-09-09 NOTE — Progress Notes (Signed)
Pre-visit discussion using our clinic review tool. No additional management support is needed unless otherwise documented below in the visit note.  

## 2013-09-09 NOTE — Progress Notes (Signed)
Subjective:    Patient ID: Tracy [name], female    DOB: November 16, 1945, 67 y.o.   MRN: [id number]  HPI Pre-visit discussion using our clinic review tool. No additional management support is needed unless otherwise documented below in the visit note.  67 year old patient who is seen today for followup. She has a history of treated hypertension. For the past week she has had a chief complaint of cough. She has been using a number of OTC medications and still having a difficult time with sleep. No fever chills chest pain or shortness of breath. Cough is largely nonproductive.  Past Medical History  Diagnosis Date  . Headache(784.0)   . Hypertension   . Myocardial infarct     hx of  . Hyperlipidemia   . Menopausal syndrome   . Allergic rhinitis   . Low back pain   . Overactive bladder     History   Social History  . Marital Status: Married    Spouse Name: N/A    Number of Children: N/A  . 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 Smoking status: Former Smoker    Quit date: 07/01/2012  . Smokeless tobacco: Never Used     Comment: electronic cigarettes  . Alcohol Use: No  . Drug Use: No  . Sexual Activity: Not on file   Other Topics Concern  . Not on file   Social History Narrative  . No narrative on file    Past Surgical History  Procedure Laterality Date  . Tonsillectomy    . Angioplasty      stent 2007    Family History  Problem Relation Age of Onset  . Adopted: Yes  . Other      patient is adopted    Allergies  Allergen Reactions  . Erythromycin   . Sulfamethoxazole     REACTION: unspecified    Current Outpatient Prescriptions on File Prior to Visit  Medication Sig Dispense Refill  . ALPRAZolam (XANAX) 0.25 MG tablet TAKE 1 TABLET BY MOUTH TWICE A DAY AS NEEDED FOR ANXIETY  50 tablet  1  . aspirin 81 MG tablet Take 81 mg by mouth daily.        . cholecalciferol (VITAMIN  D) 1000 UNITS tablet Take 1,000 Units by mouth daily.        . clopidogrel (PLAVIX) 75 MG tablet Take 1 tablet (75 mg total) by mouth daily.  90 tablet  3  . ezetimibe (ZETIA) 10 MG tablet Take 1 tablet (10 mg total) by mouth daily.  90 tablet  2  . HYDROcodone-acetaminophen (NORCO/VICODIN) 5-325 MG per tablet TAKE 1 TABLET EVERY 6 HOURS AS NEEDED FOR PAIN  50 tablet  1  . metoprolol (LOPRESSOR) 100 MG tablet TAKE 1 TABLET (100 MG TOTAL) BY MOUTH 2 (TWO) TIMES DAILY.  180 tablet  0  . Multiple Vitamin (MULTIVITAMIN) tablet Take 1 tablet by mouth daily.        . nitroGLYCERIN (NITROSTAT) 0.4 MG SL tablet Place 1 tablet (0.4 mg total) under the tongue every 5 (five) minutes as needed.  25 tablet  6  . Omega-3 Fatty Acids (FISH OIL) 1000 MG CAPS Take by mouth.        . pantoprazole (PROTONIX) 40 MG tablet Take 1 tablet (40 mg total) by mouth daily.  90 tablet  3  . simvastatin (ZOCOR) 40 MG tablet Take 1 tablet (  40 mg total) by mouth at bedtime.  90 tablet  3   No current facility-administered medications on file prior to visit.    BP 150/80  Pulse 73  Temp(Src) 97.7 F (36.5 C) (Oral)  Resp 20  Wt 188 lb (85.276 kg)  SpO2 96%       Review of Systems  Constitutional: Positive for fatigue. Negative for fever and chills.  HENT: Negative for congestion, dental problem, hearing loss, rhinorrhea, sinus pressure, sore throat and tinnitus.   Eyes: Negative for pain, discharge and visual disturbance.  Respiratory: Positive for cough. Negative for shortness of breath.   Cardiovascular: Negative for chest pain, palpitations and leg swelling.  Gastrointestinal: Negative for nausea, vomiting, abdominal pain, diarrhea, constipation, blood in stool and abdominal distention.  Genitourinary: Negative for dysuria, urgency, frequency, hematuria, flank pain, vaginal bleeding, vaginal discharge, difficulty urinating, vaginal pain and pelvic pain.  Musculoskeletal: Negative for arthralgias, gait problem  and joint swelling.  Skin: Negative for rash.  Neurological: Negative for dizziness, syncope, speech difficulty, weakness, numbness and headaches.  Hematological: Negative for adenopathy.  Psychiatric/Behavioral: Negative for behavioral problems, dysphoric mood and agitation. The patient is not nervous/anxious.        Objective:   Physical Exam  Constitutional: She is oriented to person, place, and time. She appears well-developed and well-nourished.  HENT:  Head: Normocephalic.  Right Ear: External ear normal.  Left Ear: External ear normal.  Mouth/Throat: Oropharynx is clear and moist.  Eyes: Conjunctivae and EOM are normal. Pupils are equal, round, and reactive to light.  Neck: Normal range of motion. Neck supple. No thyromegaly present.  Cardiovascular: Normal rate, regular rhythm, normal heart sounds and intact distal pulses.   Pulmonary/Chest: Effort normal and breath sounds normal.  Abdominal: Soft. Bowel sounds are normal. She exhibits no mass. There is no tenderness.  Musculoskeletal: Normal range of motion.  Lymphadenopathy:    She has no cervical adenopathy.  Neurological: She is alert and oriented to person, place, and time.  Skin: Skin is warm and dry. No rash noted.  Psychiatric: She has a normal mood and affect. Her behavior is normal.          Assessment & Plan:   URI with cough. Will treat symptomatically Allergic rhinitis Hypertension History tobacco use

## 2013-09-09 NOTE — Addendum Note (Signed)
Addended by: Jimmye Norman on: 09/09/2013 03:15 PM   Modules accepted: Orders

## 2013-09-13 ENCOUNTER — Ambulatory Visit
Admission: RE | Admit: 2013-09-13 | Discharge: 2013-09-13 | Disposition: A | Discharge status: Home / Self Care | Payer: Medicare Other | Source: Ambulatory Visit | Attending: Internal Medicine | Admitting: Internal Medicine

## 2013-09-13 ENCOUNTER — Other Ambulatory Visit: Payer: Self-pay | Admitting: Internal Medicine

## 2013-09-13 DIAGNOSIS — N644 Mastodynia: Secondary | ICD-10-CM | POA: Diagnosis not present

## 2013-11-18 ENCOUNTER — Encounter: Payer: Medicare Other | Admitting: Internal Medicine

## 2013-12-11 ENCOUNTER — Other Ambulatory Visit: Payer: Self-pay | Admitting: Internal Medicine

## 2013-12-13 ENCOUNTER — Encounter: Payer: Medicare Other | Admitting: Internal Medicine

## 2013-12-18 ENCOUNTER — Ambulatory Visit (INDEPENDENT_AMBULATORY_CARE_PROVIDER_SITE_OTHER): Payer: Medicare Other | Admitting: Family

## 2013-12-18 ENCOUNTER — Encounter: Payer: Self-pay | Admitting: Family

## 2013-12-18 VITALS — BP 144/84 | HR 72 | Ht 65.0 in | Wt 186.0 lb

## 2013-12-18 DIAGNOSIS — M79609 Pain in unspecified limb: Secondary | ICD-10-CM

## 2013-12-18 DIAGNOSIS — M79641 Pain in right hand: Secondary | ICD-10-CM

## 2013-12-18 DIAGNOSIS — M79642 Pain in left hand: Secondary | ICD-10-CM

## 2013-12-18 DIAGNOSIS — M7989 Other specified soft tissue disorders: Secondary | ICD-10-CM | POA: Diagnosis not present

## 2013-12-18 LAB — CBC WITH DIFFERENTIAL/PLATELET
Basophils Absolute: 0.1 10*3/uL (ref 0.0–0.1)
Basophils Relative: 0.7 % (ref 0.0–3.0)
Eosinophils Absolute: 0.3 10*3/uL (ref 0.0–0.7)
Eosinophils Relative: 2.7 % (ref 0.0–5.0)
HCT: 46.3 % — ABNORMAL HIGH (ref 36.0–46.0)
Hemoglobin: 14.9 g/dL (ref 12.0–15.0)
Lymphocytes Relative: 36.9 % (ref 12.0–46.0)
Lymphs Abs: 4 10*3/uL (ref 0.7–4.0)
MCHC: 32 g/dL (ref 30.0–36.0)
MCV: 85.3 fl (ref 78.0–100.0)
Monocytes Absolute: 1.1 10*3/uL — ABNORMAL HIGH (ref 0.1–1.0)
Monocytes Relative: 10.5 % (ref 3.0–12.0)
Neutro Abs: 5.3 10*3/uL (ref 1.4–7.7)
Neutrophils Relative %: 49.2 % (ref 43.0–77.0)
Platelets: 264 10*3/uL (ref 150.0–400.0)
RBC: 5.43 Mil/uL — ABNORMAL HIGH (ref 3.87–5.11)
RDW: 15.3 % — ABNORMAL HIGH (ref 11.5–14.6)
WBC: 10.9 10*3/uL — ABNORMAL HIGH (ref 4.5–10.5)

## 2013-12-18 MED ORDER — METHYLPREDNISOLONE 4 MG PO KIT: PACK | ORAL | Status: AC

## 2013-12-18 NOTE — Progress Notes (Signed)
Pre visit review using our clinic review tool, if applicable. No additional management support is needed unless otherwise documented below in the visit note. 

## 2013-12-18 NOTE — Progress Notes (Signed)
   Subjective:    Patient ID: Tracy [name], female    DOB: 07-13-1946, 68 y.o.   MRN: [id number]  HPI Comments: Nonsmoker, patient indicated in today with complaints of low-grade, swollen, hands bilaterally x1 month and worsening. She has a known history of osteoarthritis to her knee and lower back. Rates the pain 8-9/10, worse with cold weather. However, reports running her hands under cold water that helps and apply Neosporin. No known history of rheumatoid arthritis or lupus.     Review of Systems  Constitutional: Negative.   Respiratory: Negative.   Cardiovascular: Negative.   Gastrointestinal: Negative.   Endocrine: Negative.   Musculoskeletal:       Bilateral hand pain and swelling  Skin: Negative.   Neurological: Negative.   Psychiatric/Behavioral: Negative.        Objective:   Physical Exam  Constitutional: She is oriented to person, place, and time. She appears well-developed and well-nourished.  Neck: Normal range of motion. Neck supple.  Cardiovascular: Normal rate, regular rhythm and normal heart sounds.   Pulmonary/Chest: Effort normal and breath sounds normal.  Musculoskeletal: She exhibits edema and tenderness.  Bilateral hand pain and mild swelling at the joints. Tender to palpation.   Neurological: She is alert and oriented to person, place, and time.  Skin: Skin is warm and dry.  Psychiatric: She has a normal mood and affect.          Assessment & Plan:  Tracy [name] was seen today for hand pain.  Diagnoses and associated orders for this visit:  Swelling of hand - ANA - Rheumatoid Factor - CBC with Differential  Hand pain, left - ANA - Rheumatoid Factor - CBC with Differential  Hand pain, right - ANA - Rheumatoid Factor - CBC with Differential  Other Orders - methylPREDNISolone (MEDROL DOSEPAK) 4 MG tablet; follow package directions    Call the office with any questions or concerns. Recheck as scheduled and as needed.

## 2013-12-18 NOTE — Patient Instructions (Signed)
Eucerin or Aquaphor applied twice daily  Osteoarthritis Osteoarthritis is a disease that causes soreness and swelling (inflammation) of a joint. It occurs when the cartilage at the affected joint wears down. Cartilage acts as a cushion, covering the ends of bones where they meet to form a joint. Osteoarthritis is the most common form of arthritis. It often occurs in older people. The joints affected most often by this condition include those in the:  Ends of the fingers.  Thumbs.  Neck.  Lower back.  Knees.  Hips. CAUSES  Over time, the cartilage that covers the ends of bones begins to wear away. This causes bone to rub on bone, producing pain and stiffness in the affected joints.  RISK FACTORS Certain factors can increase your chances of having osteoarthritis, including:  Older age.  Excessive body weight.  Overuse of joints. SIGNS AND SYMPTOMS   Pain, swelling, and stiffness in the joint.  Over time, the joint may lose its normal shape.  Small deposits of bone (osteophytes) may grow on the edges of the joint.  Bits of bone or cartilage can break off and float inside the joint space. This may cause more pain and damage. DIAGNOSIS  Your health care provider will do a physical exam and ask about your symptoms. Various tests may be ordered, such as:  X-rays of the affected joint.  An MRI scan.  Blood tests to rule out other types of arthritis.  Joint fluid tests. This involves using a needle to draw fluid from the joint and examining the fluid under a microscope. TREATMENT  Goals of treatment are to control pain and improve joint function. Treatment plans may include:  A prescribed exercise program that allows for rest and joint relief.  A weight control plan.  Pain relief techniques, such as:  Properly applied heat and cold.  Electric pulses delivered to nerve endings under the skin (transcutaneous electrical nerve stimulation, TENS).  Massage.  Certain  nutritional supplements.  Medicines to control pain, such as:  Acetaminophen.  Nonsteroidal anti-inflammatory drugs (NSAIDs), such as naproxen.  Narcotic or central-acting agents, such as tramadol.  Corticosteroids. These can be given orally or as an injection.  Surgery to reposition the bones and relieve pain (osteotomy) or to remove loose pieces of bone and cartilage. Joint replacement may be needed in advanced states of osteoarthritis. HOME CARE INSTRUCTIONS   Only take over-the-counter or prescription medicines as directed by your health care provider. Take all medicines exactly as instructed.  Maintain a healthy weight. Follow your health care provider's instructions for weight control. This may include dietary instructions.  Exercise as directed. Your health care provider can recommend specific types of exercise. These may include:  Strengthening exercises These are done to strengthen the muscles that support joints affected by arthritis. They can be performed with weights or with exercise bands to add resistance.  Aerobic activities These are exercises, such as brisk walking or low-impact aerobics, that get your heart pumping.  Range-of-motion activities These keep your joints limber.  Balance and agility exercises These help you maintain daily living skills.  Rest your affected joints as directed by your health care provider.  Follow up with your health care provider as directed. SEEK MEDICAL CARE IF:   Your skin turns red.  You develop a rash in addition to your joint pain.  You have worsening joint pain. SEEK IMMEDIATE MEDICAL CARE IF:  You have a significant loss of weight or appetite.  You have a fever along with  joint or muscle aches.  You have night sweats. [place] of Arthritis and Musculoskeletal and Skin Diseases: www.niams.SouthExposed.es [employer] on Aging: http://kim-miller.com/ American College of Rheumatology:  www.rheumatology.org Document Released: 10/03/2005 Document Revised: 07/24/2013 Document Reviewed: 06/10/2013 [facility] Patient Information 2014 [place], [place].

## 2013-12-19 ENCOUNTER — Other Ambulatory Visit: Payer: Self-pay | Admitting: Family

## 2013-12-19 DIAGNOSIS — R768 Other specified abnormal immunological findings in serum: Secondary | ICD-10-CM

## 2013-12-19 LAB — ANA: Anti Nuclear Antibody(ANA): POSITIVE — AB

## 2013-12-19 LAB — RHEUMATOID FACTOR: Rhuematoid fact SerPl-aCnc: <10 IU/mL (ref <=14)

## 2013-12-19 LAB — ANTI-NUCLEAR AB-TITER (ANA TITER): ANA Titer 1: NEGATIVE

## 2014-01-14 ENCOUNTER — Encounter: Payer: Self-pay | Admitting: Internal Medicine

## 2014-01-14 ENCOUNTER — Encounter: Payer: Medicare Other | Admitting: Internal Medicine

## 2014-01-14 ENCOUNTER — Ambulatory Visit (INDEPENDENT_AMBULATORY_CARE_PROVIDER_SITE_OTHER): Payer: Medicare Other | Admitting: Internal Medicine

## 2014-01-14 VITALS — BP 140/90 | HR 81 | Temp 98.0°F | Resp 20 | Ht 65.0 in | Wt 190.0 lb

## 2014-01-14 DIAGNOSIS — I252 Old myocardial infarction: Secondary | ICD-10-CM | POA: Diagnosis not present

## 2014-01-14 DIAGNOSIS — M1990 Unspecified osteoarthritis, unspecified site: Secondary | ICD-10-CM | POA: Diagnosis not present

## 2014-01-14 DIAGNOSIS — E7800 Pure hypercholesterolemia, unspecified: Secondary | ICD-10-CM | POA: Diagnosis not present

## 2014-01-14 DIAGNOSIS — I10 Essential (primary) hypertension: Secondary | ICD-10-CM

## 2014-01-14 DIAGNOSIS — Z0000 Encounter for general adult medical examination without abnormal findings: Secondary | ICD-10-CM | POA: Diagnosis not present

## 2014-01-14 DIAGNOSIS — I2510 Atherosclerotic heart disease of native coronary artery without angina pectoris: Secondary | ICD-10-CM | POA: Diagnosis not present

## 2014-01-14 LAB — CBC WITH DIFFERENTIAL/PLATELET
Basophils Absolute: 0.1 10*3/uL (ref 0.0–0.1)
Basophils Relative: 0.5 % (ref 0.0–3.0)
Eosinophils Absolute: 0.3 10*3/uL (ref 0.0–0.7)
Eosinophils Relative: 3.3 % (ref 0.0–5.0)
HCT: 45.8 % (ref 36.0–46.0)
Hemoglobin: 15.1 g/dL — ABNORMAL HIGH (ref 12.0–15.0)
Lymphocytes Relative: 35.4 % (ref 12.0–46.0)
Lymphs Abs: 3.6 10*3/uL (ref 0.7–4.0)
MCHC: 33 g/dL (ref 30.0–36.0)
MCV: 85.6 fl (ref 78.0–100.0)
Monocytes Absolute: 1 10*3/uL (ref 0.1–1.0)
Monocytes Relative: 9.9 % (ref 3.0–12.0)
Neutro Abs: 5.2 10*3/uL (ref 1.4–7.7)
Neutrophils Relative %: 50.9 % (ref 43.0–77.0)
Platelets: 295 10*3/uL (ref 150.0–400.0)
RBC: 5.35 Mil/uL — ABNORMAL HIGH (ref 3.87–5.11)
RDW: 16.5 % — ABNORMAL HIGH (ref 11.5–14.6)
WBC: 10.3 10*3/uL (ref 4.5–10.5)

## 2014-01-14 LAB — COMPREHENSIVE METABOLIC PANEL
ALT: 28 U/L (ref 0–35)
AST: 28 U/L (ref 0–37)
Albumin: 4.1 g/dL (ref 3.5–5.2)
Alkaline Phosphatase: 74 U/L (ref 39–117)
BUN: 18 mg/dL (ref 6–23)
CO2: 28 mEq/L (ref 19–32)
Calcium: 9.2 mg/dL (ref 8.4–10.5)
Chloride: 105 mEq/L (ref 96–112)
Creatinine, Ser: 0.9 mg/dL (ref 0.4–1.2)
GFR: 71.17 mL/min (ref >60.00)
Glucose, Bld: 98 mg/dL (ref 70–99)
Potassium: 4 mEq/L (ref 3.5–5.1)
Sodium: 141 mEq/L (ref 135–145)
Total Bilirubin: 0.5 mg/dL (ref 0.3–1.2)
Total Protein: 7.9 g/dL (ref 6.0–8.3)

## 2014-01-14 LAB — LIPID PANEL
Cholesterol: 211 mg/dL — ABNORMAL HIGH (ref 0–200)
HDL: 35 mg/dL — ABNORMAL LOW (ref >39.00)
LDL Cholesterol: 129 mg/dL — ABNORMAL HIGH (ref 0–99)
Total CHOL/HDL Ratio: 6
Triglycerides: 236 mg/dL — ABNORMAL HIGH (ref 0.0–149.0)
VLDL: 47.2 mg/dL — ABNORMAL HIGH (ref 0.0–40.0)

## 2014-01-14 LAB — SEDIMENTATION RATE: Sed Rate: 29 mm/hr — ABNORMAL HIGH (ref 0–22)

## 2014-01-14 LAB — TSH: TSH: 2.02 u[IU]/mL (ref 0.35–5.50)

## 2014-01-14 MED ORDER — CLOPIDOGREL BISULFATE 75 MG PO TABS: 75.0000 mg | ORAL_TABLET | Freq: Every day | ORAL | Status: DC

## 2014-01-14 MED ORDER — PANTOPRAZOLE SODIUM 40 MG PO TBEC: 40.0000 mg | DELAYED_RELEASE_TABLET | Freq: Every day | ORAL | Status: DC

## 2014-01-14 MED ORDER — ALPRAZOLAM 0.25 MG PO TABS: ORAL_TABLET | ORAL | Status: DC

## 2014-01-14 MED ORDER — HYDROCODONE-ACETAMINOPHEN 5-325 MG PO TABS: ORAL_TABLET | ORAL | Status: DC

## 2014-01-14 MED ORDER — SIMVASTATIN 40 MG PO TABS: 40.0000 mg | ORAL_TABLET | Freq: Every day | ORAL | Status: DC

## 2014-01-14 MED ORDER — NITROGLYCERIN 0.4 MG SL SUBL: 0.4000 mg | SUBLINGUAL_TABLET | SUBLINGUAL | Status: DC | PRN

## 2014-01-14 NOTE — Progress Notes (Signed)
Pre-visit discussion using our clinic review tool. No additional management support is needed unless otherwise documented below in the visit note.  

## 2014-01-14 NOTE — Progress Notes (Signed)
Subjective:    Patient ID: Tracy [name], female    DOB: 1946/06/27, 68 y.o.   MRN: [id number]  HPI  68 year old patient who is seen today for a preventive health examination.  She has hypertension, dyslipidemia, and coronary artery disease.  She is followed by cardiology.  She was seen in the upper and the month complaining of increasing pain and swelling involving her hands.  She gave a history of some early morning stiffness.  Rheumatologic screen revealed a negative RA titer, but a positive ANA.  The patient was referred to rheumatology but she feels improved and would like to do for this referral.    Past Medical History  Diagnosis Date  . Headache(784.0)   . Hypertension   . Myocardial infarct     hx of  . Hyperlipidemia   . Menopausal syndrome   . Allergic rhinitis   . Low back pain   . Overactive bladder     History   Social History  . Marital Status: Married    Spouse Name: N/A    Number of Children: N/A  . 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 Smoking status: Former Smoker    Quit date: 07/01/2012  . Smokeless tobacco: Never Used     Comment: electronic cigarettes  . Alcohol Use: No  . Drug Use: No  . Sexual Activity: Not on file   Other Topics Concern  . Not on file   Social History Narrative  . No narrative on file    Past Surgical History  Procedure Laterality Date  . Tonsillectomy    . Angioplasty      stent 2007    Family History  Problem Relation Age of Onset  . Adopted: Yes  . Other      patient is adopted    Allergies  Allergen Reactions  . Erythromycin   . Sulfamethoxazole     REACTION: unspecified    Current Outpatient Prescriptions on File Prior to Visit  Medication Sig Dispense Refill  . aspirin 81 MG tablet Take 81 mg by mouth daily.        . cholecalciferol (VITAMIN D) 1000 UNITS tablet Take 1,000 Units by mouth daily.        .  cyanocobalamin 2000 MCG tablet Take 2,500 mcg by mouth daily.       . metoprolol (LOPRESSOR) 100 MG tablet TAKE 1 TABLET (100 MG TOTAL) BY MOUTH 2 (TWO) TIMES DAILY. (NEED APPOINTMENT FOR FURTHER REFILLS)  180 tablet  1  . Multiple Vitamin (MULTIVITAMIN) tablet Take 1 tablet by mouth daily.        . Omega-3 Fatty Acids (FISH OIL) 1000 MG CAPS Take by mouth.         No current facility-administered medications on file prior to visit.    BP 140/90  Pulse 81  Temp(Src) 98 F (36.7 C) (Oral)  Resp 20  Ht 5\' 5"  (1.651 m)  Wt 190 lb (86.183 kg)  BMI 31.62 kg/m2  SpO2 96%   1. Risk factors, based on past  M,S,F history- cardiovascular risk factors include hypertension, dyslipidemia, and a history of tobacco use.  The patient has known coronary artery disease  2.  Physical activities: No activity restrictions.  The patient walks daily with her husband in the local mall  3.  Depression/mood: No history of depression or mood disorder  4.  Hearing: No  deficits  5.  ADL's: Totally independent in all aspects of daily living  6.  Fall risk: Mild increased due to  obesity and knee arthritis  7.  Home safety: No problems identified  8.  Height weight, and visual acuity; height and weight stable.  No change in visual acuity  9.  Counseling: More rigorous exercise regimen and weight loss encouraged  10. Lab orders based on risk factors: Laboratory profile will be reviewed, including lipid profile  11. Referral : Followup cardiology  12. Care plan: Weight loss and aggressive risk factor modification discussed  13. Cognitive assessment: Alert and oriented with normal affect.  No cognitive dysfunction      Review of Systems  Constitutional: Negative for fever, appetite change, fatigue and unexpected weight change.  HENT: Negative for congestion, dental problem, ear pain, hearing loss, mouth sores, nosebleeds, sinus pressure, sore throat, tinnitus, trouble swallowing and voice change.     Eyes: Negative for photophobia, pain, redness and visual disturbance.  Respiratory: Negative for cough, chest tightness and shortness of breath.   Cardiovascular: Negative for chest pain, palpitations and leg swelling.  Gastrointestinal: Negative for nausea, vomiting, abdominal pain, diarrhea, constipation, blood in stool, abdominal distention and rectal pain.  Genitourinary: Negative for dysuria, urgency, frequency, hematuria, flank pain, vaginal bleeding, vaginal discharge, difficulty urinating, genital sores, vaginal pain, menstrual problem and pelvic pain.  Musculoskeletal: Positive for arthralgias. Negative for back pain and neck stiffness.  Skin: Negative for rash.  Neurological: Negative for dizziness, syncope, speech difficulty, weakness, light-headedness, numbness and headaches.  Hematological: Negative for adenopathy. Does not bruise/bleed easily.  Psychiatric/Behavioral: Negative for suicidal ideas, behavioral problems, self-injury, dysphoric mood and agitation. The patient is not nervous/anxious.        Objective:   Physical Exam  Constitutional: She is oriented to person, place, and time. She appears well-developed and well-nourished.  Blood pressure 140/84  Moderately obese  HENT:  Head: Normocephalic and atraumatic.  Right Ear: External ear normal.  Left Ear: External ear normal.  Mouth/Throat: Oropharynx is clear and moist.  Eyes: Conjunctivae and EOM are normal.  Neck: Normal range of motion. Neck supple. No JVD present. No thyromegaly present.  Cardiovascular: Normal rate, regular rhythm, normal heart sounds and intact distal pulses.   No murmur heard. Pulmonary/Chest: Effort normal and breath sounds normal. She has no wheezes. She has no rales.  Abdominal: Soft. Bowel sounds are normal. She exhibits no distension and no mass. There is no tenderness. There is no rebound and no guarding.  Genitourinary: Vagina normal.  Musculoskeletal: Normal range of motion. She  exhibits no edema and no tenderness.   Osteoarthritic changes of all the small joints of both hands  Neurological: She is alert and oriented to person, place, and time. She has normal reflexes. No cranial nerve deficit. She exhibits normal muscle tone. Coordination normal.  Skin: Skin is warm and dry. No rash noted.  Psychiatric: She has a normal mood and affect. Her behavior is normal.          Assessment & Plan:  Preventive health examination Hypertension.  We'll continue home blood pressure monitoring Coronary artery disease.  Stable followup cardiology  Obesity.  More rigorous exercise program modest weight loss encouraged   Follow up 6 months

## 2014-01-14 NOTE — Patient Instructions (Signed)
Limit your sodium (Salt) intake    It is important that you exercise regularly, at least 20 minutes 3 to 4 times per week.  If you develop chest pain or shortness of breath seek  medical attention.  You need to lose weight.  Consider a lower calorie diet and regular exercise. 

## 2014-01-15 ENCOUNTER — Telehealth: Payer: Self-pay | Admitting: Internal Medicine

## 2014-01-15 NOTE — Telephone Encounter (Signed)
Relevant patient education assigned to patient using Emmi. ° °

## 2014-01-30 ENCOUNTER — Encounter: Payer: Medicare Other | Admitting: Internal Medicine

## 2014-02-05 ENCOUNTER — Ambulatory Visit (INDEPENDENT_AMBULATORY_CARE_PROVIDER_SITE_OTHER): Payer: Medicare Other | Admitting: Physician Assistant

## 2014-02-05 ENCOUNTER — Encounter: Payer: Self-pay | Admitting: Physician Assistant

## 2014-02-05 VITALS — BP 160/98 | HR 88 | Temp 98.6°F | Resp 16 | Wt 188.0 lb

## 2014-02-05 DIAGNOSIS — L0291 Cutaneous abscess, unspecified: Secondary | ICD-10-CM | POA: Diagnosis not present

## 2014-02-05 DIAGNOSIS — L0390 Cellulitis, unspecified: Secondary | ICD-10-CM

## 2014-02-05 DIAGNOSIS — I2510 Atherosclerotic heart disease of native coronary artery without angina pectoris: Secondary | ICD-10-CM | POA: Diagnosis not present

## 2014-02-05 MED ORDER — DOXYCYCLINE HYCLATE 100 MG PO TABS: 100.0000 mg | ORAL_TABLET | Freq: Two times a day (BID) | ORAL | Status: DC

## 2014-02-05 NOTE — Progress Notes (Signed)
Pre visit review using our clinic review tool, if applicable. No additional management support is needed unless otherwise documented below in the visit note. 

## 2014-02-05 NOTE — Progress Notes (Signed)
Subjective:    Patient ID: Tracy [name], female    DOB: [birth year], 68 y.o.   MRN: [id number]  HPI Patient is a 68 year old Caucasian female presenting to the clinic today for a bruise on her leg after a fall last week. Patient states that she was walking down some steps and fell off the bottom two and landed on her knees and wrists. She said that immediately she noticed what she calls a bruise appearing on the anterior aspect of her lower right leg. She said that over the past week it has progressively gotten tighter feeling, redder, warmer, and more painful. She states that she wakes up at night if she rolls onto the bruise. She states that the pain is a 6/10 sharp pain. She also states that she was experiencing some knee discomfort and ankle discomfort from the injury, however these have been gradually improving over the past week with rest, activity modification, and ice. Her gait and muscle strength have not been affected by these injuries. She denies fevers, chills, nausea, vomiting, diarrhea, shortness of breath.  Review of Systems As per history of present illness and are otherwise negative.  Past Medical History  Diagnosis Date  . Headache(784.0)   . Hypertension   . Myocardial infarct     hx of  . Hyperlipidemia   . Menopausal syndrome   . Allergic rhinitis   . Low back pain   . Overactive bladder    Past Surgical History  Procedure Laterality Date  . Tonsillectomy    . Angioplasty      stent 2007    reports that she quit smoking about 19 months ago. She has never used smokeless tobacco. She reports that she does not drink alcohol or use illicit drugs. family history includes Other in an other family member. She was adopted. Allergies  Allergen Reactions  . Erythromycin   . Sulfamethoxazole     REACTION: unspecified       Objective:   Physical Exam  Nursing note and vitals reviewed. Constitutional: She is oriented to person, place, and time. She appears  well-developed and well-nourished. No distress.  HENT:  Head: Normocephalic and atraumatic.  Eyes: Conjunctivae and EOM are normal. Pupils are equal, round, and reactive to light.  Neck: Normal range of motion. Neck supple.  Cardiovascular: Normal rate, regular rhythm, normal heart sounds and intact distal pulses.  Exam reveals no gallop and no friction rub.   No murmur heard. Pulmonary/Chest: Effort normal and breath sounds normal. No respiratory distress. She has no wheezes. She has no rales. She exhibits no tenderness.  Abdominal: Soft. Bowel sounds are normal. There is no tenderness.  Musculoskeletal: Normal range of motion. She exhibits tenderness. She exhibits no edema.  Tenderness located in the area of the skin lesion.  Neurological: She is alert and oriented to person, place, and time. She has normal reflexes. She displays normal reflexes. No cranial nerve deficit. She exhibits normal muscle tone. Coordination normal.  Skin: Skin is warm and dry. No rash noted. She is not diaphoretic. There is erythema. No pallor.  There is a 3 cm area of erythema on her anterior lower right leg. This is indurated, not fluctuant. It is warm to the touch and very tender to palpation.  Psychiatric: She has a normal mood and affect. Her behavior is normal. Judgment and thought content normal.   Filed Vitals:   02/05/14 1348  BP: 160/98  Pulse: 88  Temp: 98.6 F (37 C)  Resp:  16   Lab Results  Component Value Date   WBC 10.3 01/14/2014   HGB 15.1* 01/14/2014   HCT 45.8 01/14/2014   PLT 295.0 01/14/2014   GLUCOSE 98 01/14/2014   CHOL 211* 01/14/2014   TRIG 236.0* 01/14/2014   HDL 35.00* 01/14/2014   LDLDIRECT 163.6 07/22/2011   LDLCALC 129* 01/14/2014   ALT 28 01/14/2014   AST 28 01/14/2014   NA 141 01/14/2014   K 4.0 01/14/2014   CL 105 01/14/2014   CREATININE 0.9 01/14/2014   BUN 18 01/14/2014   CO2 28 01/14/2014   TSH 2.02 01/14/2014      Assessment & Plan:  Tracy [name] was seen today for  bruise.  Diagnoses and associated orders for this visit:  Cellulitis - doxycycline (VIBRA-TABS) 100 MG tablet; Take 1 tablet (100 mg total) by mouth 2 (two) times daily.    Patient Instructions  Doxycycline 100 mg by mouth twice a day x10 days for cellulitis.  Warm compresses applied to area of cellulitis.  Conservative treatment using rest, ice, compression, elevation for joint injuries from fall.  Followup at clinic in approximately one week to assess the recovery progress.

## 2014-02-05 NOTE — Patient Instructions (Signed)
Doxycycline 100 mg by mouth twice a day x10 days for cellulitis.  Warm compresses applied to area of cellulitis.  Conservative treatment using rest, ice, compression, elevation for joint injuries from fall.  Followup at clinic in approximately one week to assess the recovery progress.  RICE: Routine Care for Injuries The routine care of many injuries includes Rest, Ice, Compression, and Elevation (RICE). HOME CARE INSTRUCTIONS  Rest is needed to allow your body to heal. Routine activities can usually be resumed when comfortable. Injured tendons and bones can take up to 6 weeks to heal. Tendons are the cord-like structures that attach muscle to bone.  Ice following an injury helps keep the swelling down and reduces pain.  Put ice in a plastic bag.  Place a towel between your skin and the bag.  Leave the ice on for 15-20 minutes, 03-04 times a day. Do this while awake, for the first 24 to 48 hours. After that, continue as directed by your caregiver.  Compression helps keep swelling down. It also gives support and helps with discomfort. If an elastic bandage has been applied, it should be removed and reapplied every 3 to 4 hours. It should not be applied tightly, but firmly enough to keep swelling down. Watch fingers or toes for swelling, bluish discoloration, coldness, numbness, or excessive pain. If any of these problems occur, remove the bandage and reapply loosely. Contact your caregiver if these problems continue.  Elevation helps reduce swelling and decreases pain. With extremities, such as the arms, hands, legs, and feet, the injured area should be placed near or above the level of the heart, if possible. SEEK IMMEDIATE MEDICAL CARE IF:  You have persistent pain and swelling.  You develop redness, numbness, or unexpected weakness.  Your symptoms are getting worse rather than improving after several days. These symptoms may indicate that further evaluation or further X-rays are  needed. Sometimes, X-rays may not show a small broken bone (fracture) until 1 week or 10 days later. Make a follow-up appointment with your caregiver. Ask when your X-ray results will be ready. Make sure you get your X-ray results. Document Released: 01/15/2001 Document Revised: 12/26/2011 Document Reviewed: 03/04/2011 [facility] Patient Information 2014 [place], [place].

## 2014-02-10 ENCOUNTER — Ambulatory Visit (INDEPENDENT_AMBULATORY_CARE_PROVIDER_SITE_OTHER): Payer: Medicare Other | Admitting: Physician Assistant

## 2014-02-10 ENCOUNTER — Ambulatory Visit (INDEPENDENT_AMBULATORY_CARE_PROVIDER_SITE_OTHER)
Admission: RE | Admit: 2014-02-10 | Discharge: 2014-02-10 | Disposition: A | Discharge status: Home / Self Care | Payer: Medicare Other | Source: Ambulatory Visit | Attending: Physician Assistant | Admitting: Physician Assistant

## 2014-02-10 ENCOUNTER — Encounter: Payer: Self-pay | Admitting: Cardiovascular Disease

## 2014-02-10 ENCOUNTER — Encounter: Payer: Self-pay | Admitting: Physician Assistant

## 2014-02-10 ENCOUNTER — Telehealth: Payer: Self-pay | Admitting: Physician Assistant

## 2014-02-10 ENCOUNTER — Telehealth: Payer: Self-pay | Admitting: Internal Medicine

## 2014-02-10 VITALS — BP 140/98 | HR 91 | Temp 98.2°F | Resp 18 | Wt 192.0 lb

## 2014-02-10 DIAGNOSIS — I2510 Atherosclerotic heart disease of native coronary artery without angina pectoris: Secondary | ICD-10-CM | POA: Diagnosis not present

## 2014-02-10 DIAGNOSIS — J209 Acute bronchitis, unspecified: Secondary | ICD-10-CM

## 2014-02-10 DIAGNOSIS — S8010XA Contusion of unspecified lower leg, initial encounter: Secondary | ICD-10-CM | POA: Diagnosis not present

## 2014-02-10 DIAGNOSIS — L0390 Cellulitis, unspecified: Secondary | ICD-10-CM

## 2014-02-10 DIAGNOSIS — L0291 Cutaneous abscess, unspecified: Secondary | ICD-10-CM

## 2014-02-10 MED ORDER — HYDROCODONE-HOMATROPINE 5-1.5 MG/5ML PO SYRP: 5.0000 mL | ORAL_SOLUTION | Freq: Four times a day (QID) | ORAL | Status: DC | PRN

## 2014-02-10 MED ORDER — IOHEXOL 300 MG/ML  SOLN
80.0000 mL | Freq: Once | INTRAMUSCULAR | Status: AC | PRN
  Administered 2014-02-10: 80 mL via INTRAVENOUS

## 2014-02-10 MED ORDER — LEVOFLOXACIN 500 MG PO TABS: 500.0000 mg | ORAL_TABLET | Freq: Every day | ORAL | Status: DC

## 2014-02-10 NOTE — Telephone Encounter (Signed)
Call PT and tell her that, due to her symptoms worsening and her reaction to the antibiotic, she needs to come in and be seen today.

## 2014-02-10 NOTE — Telephone Encounter (Signed)
Call Pt. Ask her if her leg is looking better. If it is not, tell her that we will send her an additional antibiotic to her pharmacy today and she would need to take both to completion. Regardless, she needs to follow up on Wednesday if possible to make sure it looks better.

## 2014-02-10 NOTE — Progress Notes (Signed)
Subjective:    Patient ID: Tracy [name], female    DOB: 04-02-1946, 68 y.o.   MRN: [id number]  Cough   Patient is a 68 year old Caucasian female presenting for followup of cellulitis. Patient was seen last Wednesday in the clinic after having fallen. At that time the patient's main concern was a large "bruise" per her wording on her right shin area. She was diagnosed with cellulitis and prescribed 10 days of doxycycline 100 mg. She states that this medication made her nauseated and she vomited several times while taking it. She states today that she feels as though the spot on her leg has grown larger, though it looks less red.  Patient is also complaining today of a cough and a sore throat. She states that this started at the end of last week. She states that she also has felt feverish. She has tried nothing for this except for cough drops which have provided mild relief. She denies chills, diarrhea, shortness of breath.  Review of Systems  Respiratory: Positive for cough.    As per the history of present illness and are otherwise negative.  Past Medical History  Diagnosis Date  . Headache(784.0)   . Hypertension   . Myocardial infarct     hx of  . Hyperlipidemia   . Menopausal syndrome   . Allergic rhinitis   . Low back pain   . Overactive bladder    Past Surgical History  Procedure Laterality Date  . Tonsillectomy    . Angioplasty      stent 2007    reports that she quit smoking about 19 months ago. She has never used smokeless tobacco. She reports that she does not drink alcohol or use illicit drugs. family history includes Other in an other family member. She was adopted. Allergies  Allergen Reactions  . Erythromycin   . Sulfamethoxazole     REACTION: unspecified   No change in PFS history since last office visit on 01/14/2014     Objective:   Physical Exam  Nursing note and vitals reviewed. Constitutional: She is oriented to person, place, and time. She  appears well-developed and well-nourished. No distress.  HENT:  Head: Normocephalic and atraumatic.  Right Ear: External ear normal.  Left Ear: External ear normal.  Nose: Nose normal.  Mouth/Throat: Oropharynx is clear and moist.  Eyes: Conjunctivae and EOM are normal. Pupils are equal, round, and reactive to light.  Neck: Normal range of motion. Neck supple. No JVD present. No tracheal deviation present. No thyromegaly present.  Cardiovascular: Normal rate, regular rhythm, normal heart sounds and intact distal pulses.  Exam reveals no gallop and no friction rub.   No murmur heard. Pulmonary/Chest: Effort normal. No stridor. No respiratory distress. She has no wheezes. She exhibits no tenderness.  Coarse crackles and rhonchi heard in the right middle lung field. Left lung fields normal to auscultation.  Abdominal: Soft. Bowel sounds are normal. There is no tenderness.  Musculoskeletal: Normal range of motion. She exhibits tenderness (Right lower limb over the area of cellulitis.). She exhibits no edema.  Lymphadenopathy:    She has no cervical adenopathy.  Neurological: She is alert and oriented to person, place, and time. She has normal reflexes. No cranial nerve deficit. Coordination normal.  Skin: Skin is warm and dry. No rash noted. She is not diaphoretic. There is erythema (erythema appears to have decreased since initial presentation.). No pallor.  Right lower limb: The cellulitic area of her skin is still slightly  erythematous. The diameter of the spot as increased to 5 or 6 cm across. The area is palpable, however it is still not really fluctuant.  Psychiatric: She has a normal mood and affect. Her behavior is normal. Judgment and thought content normal.   Filed Vitals:   02/10/14 1506  BP: 140/98  Pulse: 91  Temp: 98.2 F (36.8 C)  Resp: 18   Lab Results  Component Value Date   WBC 10.3 01/14/2014   HGB 15.1* 01/14/2014   HCT 45.8 01/14/2014   PLT 295.0 01/14/2014   GLUCOSE  98 01/14/2014   CHOL 211* 01/14/2014   TRIG 236.0* 01/14/2014   HDL 35.00* 01/14/2014   LDLDIRECT 163.6 07/22/2011   LDLCALC 129* 01/14/2014   ALT 28 01/14/2014   AST 28 01/14/2014   NA 141 01/14/2014   K 4.0 01/14/2014   CL 105 01/14/2014   CREATININE 0.9 01/14/2014   BUN 18 01/14/2014   CO2 28 01/14/2014   TSH 2.02 01/14/2014      Assessment & Plan:  Chasitty was seen today for follow up knot on leg and cough.  Diagnoses and associated orders for this visit:  Cellulitis - CT Tibia Fibula Right W Contrast; Future  Acute bronchitis - levofloxacin (LEVAQUIN) 500 MG tablet; Take 1 tablet (500 mg total) by mouth daily. - HYDROcodone-homatropine (HYCODAN) 5-1.5 MG/5ML syrup; Take 5 mLs by mouth every 6 (six) hours as needed for cough.    Patient Instructions  Levofloxacin 500 mg by mouth daily for 10 days for bronchitis.  Hycodan cough syrup every 12 hours as needed for cough. Do not Drive while you are taking this medication as it can inhibit your ability to properly operate a motor vehicle.   You'll have a CT of your leg done today at [address] in order to determine the best course of action for your cellulitis.  You'll be called with the results and any changes in the plan.  Followup if symptoms worsen or do not improve despite treatment.

## 2014-02-10 NOTE — Telephone Encounter (Signed)
Duplicate message see telephone message from [place].

## 2014-02-10 NOTE — Patient Instructions (Signed)
Levofloxacin 500 mg by mouth daily for 10 days for bronchitis.  Hycodan cough syrup every 12 hours as needed for cough. Do not Drive while you are taking this medication as it can inhibit your ability to properly operate a motor vehicle.   You'll have a CT of your leg done today at [address] in order to determine the best course of action for your cellulitis.  You'll be called with the results and any changes in the plan.  Followup if symptoms worsen or do not improve despite treatment.  Acute Bronchitis Bronchitis is when the airways that extend from the windpipe into the lungs get red, puffy, and painful (inflamed). Bronchitis often causes thick spit (mucus) to develop. This leads to a cough. A cough is the most common symptom of bronchitis. In acute bronchitis, the condition usually begins suddenly and goes away over time (usually in 2 weeks). Smoking, allergies, and asthma can make bronchitis worse. Repeated episodes of bronchitis may cause more lung problems. HOME CARE  Rest.  Drink enough fluids to keep your pee (urine) clear or pale yellow (unless you need to limit fluids as told by your doctor).  Only take over-the-counter or prescription medicines as told by your doctor.  Avoid smoking and secondhand smoke. These can make bronchitis worse. If you are a smoker, think about using nicotine gum or skin patches. Quitting smoking will help your lungs heal faster.  Reduce the chance of getting bronchitis again by:  Washing your hands often.  Avoiding people with cold symptoms.  Trying not to touch your hands to your mouth, nose, or eyes.  Follow up with your doctor as told. GET HELP IF: Your symptoms do not improve after 1 week of treatment. Symptoms include:  Cough.  Fever.  Coughing up thick spit.  Body aches.  Chest congestion.  Chills.  Shortness of breath.  Sore throat. GET HELP RIGHT AWAY IF:   You have an increased fever.  You have chills.  You  have severe shortness of breath.  You have bloody thick spit (sputum).  You throw up (vomit) often.  You lose too much body fluid (dehydration).  You have a severe headache.  You faint. MAKE SURE YOU:   Understand these instructions.  Will watch your condition.  Will get help right away if you are not doing well or get worse. Document Released: 03/21/2008 Document Revised: 06/05/2013 Document Reviewed: 03/26/2013 [facility] Patient Information 2014 [place].

## 2014-02-10 NOTE — Telephone Encounter (Signed)
Pt was seen on 02/05/14 by matt, states that the antibiotic that was prescribed to her is making her very sick, pt states her leg is not better she still has a big knot on it. Pt wants to speak with the nurse to give her suggestions on what she can do.

## 2014-02-10 NOTE — Telephone Encounter (Signed)
Called and spoke with pt and pt will be here at 3:15 pm today.

## 2014-02-10 NOTE — Progress Notes (Signed)
Pre visit review using our clinic review tool, if applicable. No additional management support is needed unless otherwise documented below in the visit note. 

## 2014-02-10 NOTE — Telephone Encounter (Signed)
Per pt's questionnaire she is running a fever.  This happened over the weekend and the highest was 100.2 on Saturday.  Pt had a slight one on Sunday at 99.  Per pt the knot is still there and it hurts.  There is still a red place there and the antibiotic is making pt sick (nausea and vomiting).  Pt would like a milder antibiotic. Advised pt that another antibiotic may be needed in addition to the one she is already taking.  Pls advise.  Pt would like to be contacted on cell phone.

## 2014-02-12 ENCOUNTER — Ambulatory Visit: Payer: Medicare Other | Admitting: Physician Assistant

## 2014-02-18 ENCOUNTER — Ambulatory Visit (INDEPENDENT_AMBULATORY_CARE_PROVIDER_SITE_OTHER): Payer: Medicare Other | Admitting: Physician Assistant

## 2014-02-18 ENCOUNTER — Encounter: Payer: Self-pay | Admitting: Physician Assistant

## 2014-02-18 VITALS — BP 140/92 | HR 84 | Temp 97.9°F | Resp 16 | Wt 188.0 lb

## 2014-02-18 DIAGNOSIS — I2510 Atherosclerotic heart disease of native coronary artery without angina pectoris: Secondary | ICD-10-CM

## 2014-02-18 DIAGNOSIS — Z09 Encounter for follow-up examination after completed treatment for conditions other than malignant neoplasm: Secondary | ICD-10-CM

## 2014-02-18 DIAGNOSIS — I10 Essential (primary) hypertension: Secondary | ICD-10-CM | POA: Diagnosis not present

## 2014-02-18 NOTE — Progress Notes (Signed)
Pre visit review using our clinic review tool, if applicable. No additional management support is needed unless otherwise documented below in the visit note. 

## 2014-02-18 NOTE — Progress Notes (Signed)
Subjective:    Patient ID: Tracy [name], female    DOB: 04/01/1946, 68 y.o.   MRN: [id number]  HPI Patient is a 68 year old Caucasian female returning to the clinic today for followup of bronchitis and lower extremity cellulitis. Patient states that both conditions are improving. Patient states that her cough has gotten significantly better and she has little to no sputum production now. States that she has not had to take her cough medicine the past 2 nights. She states that the swelling in her right lower leg seems to still be slowly decreasing, it is no longer painful or inflamed. Patient had a CT of the extremity done last week which showed a slowly resolving hematoma. She denies fevers, chills, nausea, vomiting, diarrhea, and shortness of breath.   Review of Systems As per the history of present illness and are otherwise negative.  Past Medical History  Diagnosis Date  . Headache(784.0)   . Hypertension   . Myocardial infarct     hx of  . Hyperlipidemia   . Menopausal syndrome   . Allergic rhinitis   . Low back pain   . Overactive bladder    Past Surgical History  Procedure Laterality Date  . Tonsillectomy    . Angioplasty      stent 2007    reports that she quit smoking about 19 months ago. She has never used smokeless tobacco. She reports that she does not drink alcohol or use illicit drugs. family history includes Other in an other family member. She was adopted. Allergies  Allergen Reactions  . Erythromycin   . Sulfamethoxazole     REACTION: unspecified       Objective:   Physical Exam  Nursing note and vitals reviewed. Constitutional: She is oriented to person, place, and time. She appears well-developed and well-nourished. No distress.  HENT:  Head: Normocephalic and atraumatic.  Eyes: Conjunctivae and EOM are normal. Pupils are equal, round, and reactive to light.  Neck: Normal range of motion.  Cardiovascular: Normal rate, regular rhythm, normal  heart sounds and intact distal pulses.  Exam reveals no gallop and no friction rub.   No murmur heard. Pulmonary/Chest: Effort normal and breath sounds normal. No stridor. No respiratory distress. She has no wheezes. She has no rales. She exhibits no tenderness.  Musculoskeletal: Normal range of motion. She exhibits no edema and no tenderness.  Neurological: She is alert and oriented to person, place, and time.  Skin: Skin is warm and dry. No rash noted. She is not diaphoretic. No erythema. No pallor.  Right lower extremity: 5-6 cm in diameter hematoma on the lateral aspect of the lower leg just below the knee. It is palpable but not fluctuant. There is no erythema. There is no pain to palpation.  Psychiatric: She has a normal mood and affect. Her behavior is normal. Judgment and thought content normal.   Filed Vitals:   02/18/14 0917  BP: 140/92  Pulse: 84  Temp: 97.9 F (36.6 C)  TempSrc: Oral  Resp: 16  Weight: 188 lb (85.276 kg)  SpO2: 97%   Lab Results  Component Value Date   WBC 10.3 01/14/2014   HGB 15.1* 01/14/2014   HCT 45.8 01/14/2014   PLT 295.0 01/14/2014   GLUCOSE 98 01/14/2014   CHOL 211* 01/14/2014   TRIG 236.0* 01/14/2014   HDL 35.00* 01/14/2014   LDLDIRECT 163.6 07/22/2011   LDLCALC 129* 01/14/2014   ALT 28 01/14/2014   AST 28 01/14/2014   NA 141  01/14/2014   K 4.0 01/14/2014   CL 105 01/14/2014   CREATININE 0.9 01/14/2014   BUN 18 01/14/2014   CO2 28 01/14/2014   TSH 2.02 01/14/2014      Assessment & Plan:  Tracy [name] was seen today for follow-up of Bronchitis and Cellulitis.  Diagnoses and associated orders for this visit:   Follow up Comments: Bronchitis Cellulitis - Both resolving, continue current treatment.  Unspecified essential hypertension -  BP 140/92. Blood Pressure has consistently been in the 891Q systolic at past visits. Pt is currently on medication for hypertension. Pt is not experiencing any symptoms, however does admit to some anxiousness during  BP check due to anticipation of pain from BP cuff. Pt does not do home BP checks, but does have BP machine at home. Will have pt check BP at home this week and call with home results on Friday.   Patient Instructions  Continue taking cough syrup at bedtime as needed for cough.  Continue taking Zyrtec daily for seasonal allergies.  Check Blood Pressure daily at home for the next few days, record both the BP reading and the time of day you take it. Try to spread out the different readings during the day to give a good picture of blood pressure at different times of the day.  Call office on Friday and tell medical assistant what your readings have been during the week.  Follow up as needed, or if symptoms worsen or don't improve despite treatment.

## 2014-02-18 NOTE — Patient Instructions (Signed)
Continue taking cough syrup at bedtime as needed for cough.  Continue taking Zyrtec daily for seasonal allergies.  Check Blood Pressure daily at home for the next few days, record both the BP reading and the time of day you take it. Try to spread out the different readings during the day to give a good picture of blood pressure at different times of the day.  Call office on Friday and tell medical assistant what your readings have been during the week.  Follow up as needed, or if symptoms worsen or don't improve despite treatment.  Bronchitis Bronchitis is swelling (inflammation) of the air tubes leading to your lungs (bronchi). This causes mucus and a cough. If the swelling gets bad, you may have trouble breathing. HOME CARE   Rest.  Drink enough fluids to keep your pee (urine) clear or pale yellow (unless you have a condition where you have to watch how much you drink).  Only take medicine as told by your doctor. If you were given antibiotic medicines, finish them even if you start to feel better.  Avoid smoke, irritating chemicals, and strong smells. These make the problem worse. Quit smoking if you smoke. This helps your lungs heal faster.  Use a cool mist humidifier. Change the water in the humidifier every day. You can also sit in the bathroom with hot shower running for 5 10 minutes. Keep the door closed.  See your health care provider as told.  Wash your hands often. GET HELP IF: Your problems do not get better after 1 week. GET HELP RIGHT AWAY IF:   Your fever gets worse.  You have chills.  Your chest hurts.  Your problems breathing get worse.  You have blood in your mucus.  You pass out (faint).  You feel lightheaded.  You have a bad headache.  You throw up (vomit) again and again. MAKE SURE YOU:  Understand these instructions.  Will watch your condition.  Will get help right away if you are not doing well or get worse. Document Released: 03/21/2008  Document Revised: 07/24/2013 Document Reviewed: 05/28/2013 [facility] Patient Information 2014 [place], [place].

## 2014-02-21 ENCOUNTER — Ambulatory Visit: Payer: Medicare Other | Admitting: Cardiovascular Disease

## 2014-04-15 ENCOUNTER — Encounter: Payer: Self-pay | Admitting: Cardiovascular Disease

## 2014-04-15 ENCOUNTER — Ambulatory Visit (INDEPENDENT_AMBULATORY_CARE_PROVIDER_SITE_OTHER): Payer: Medicare Other | Admitting: Cardiovascular Disease

## 2014-04-15 VITALS — BP 144/78 | HR 72 | Ht 65.0 in | Wt 184.4 lb

## 2014-04-15 DIAGNOSIS — I252 Old myocardial infarction: Secondary | ICD-10-CM | POA: Diagnosis not present

## 2014-04-15 DIAGNOSIS — I10 Essential (primary) hypertension: Secondary | ICD-10-CM

## 2014-04-15 DIAGNOSIS — E7800 Pure hypercholesterolemia, unspecified: Secondary | ICD-10-CM | POA: Diagnosis not present

## 2014-04-15 DIAGNOSIS — I2510 Atherosclerotic heart disease of native coronary artery without angina pectoris: Secondary | ICD-10-CM | POA: Diagnosis not present

## 2014-04-15 NOTE — Progress Notes (Signed)
HPI:  68 year old woman presenting for followup evaluation. The patient has been followed for coronary artery disease. She initially presented in 2007 with a lateral wall MI. She was treated with primary PCI using a bare-metal stent in the left circumflex. Her last heart catheterization was in 2008 and this demonstrated mildly reduced LV systolic function with an ejection fraction estimated at 50%, mild mitral regurgitation, stent patency of the left circumflex, and minor nonobstructive disease elsewhere. Last echo study in 2010 showed normal LV systolic function and mild mitral regurgitation.  The patient denies chest pain or pressure. She has dyspnea with exertion, especially during the summer months when the temperature is hot. She denies edema, orthopnea, PND, or heart palpitations.  She reports that her diet has been poor. She's been eating a lot of junk food. She has not been exercising. She forgets her medicines occasionally, but overall her problems and has been maintained on long-term dual antiplatelet therapy with aspirin and Plavix. She has not been checking home blood pressures, but does have a cuff.  Outpatient Encounter Prescriptions as of 04/15/2014  Medication Sig  . ALPRAZolam (XANAX) 0.25 MG tablet TAKE 1 TABLET BY MOUTH TWICE A DAY AS NEEDED FOR ANXIETY  . aspirin 81 MG tablet Take 81 mg by mouth daily.    . cholecalciferol (VITAMIN D) 1000 UNITS tablet Take 1,000 Units by mouth daily.    . clopidogrel (PLAVIX) 75 MG tablet Take 1 tablet (75 mg total) by mouth daily.  . cyanocobalamin 2000 MCG tablet Take 2,500 mcg by mouth daily.   Marland Kitchen HYDROcodone-acetaminophen (NORCO/VICODIN) 5-325 MG per tablet TAKE 1 TABLET EVERY 6 HOURS AS NEEDED FOR PAIN  . metoprolol (LOPRESSOR) 100 MG tablet TAKE 1 TABLET (100 MG TOTAL) BY MOUTH 2 (TWO) TIMES DAILY. (NEED APPOINTMENT FOR FURTHER REFILLS)  . Multiple Vitamin (MULTIVITAMIN) tablet Take 1 tablet by mouth daily.    . nitroGLYCERIN  (NITROSTAT) 0.4 MG SL tablet Place 1 tablet (0.4 mg total) under the tongue every 5 (five) minutes as needed.  . Omega-3 Fatty Acids (FISH OIL) 1000 MG CAPS Take by mouth.    . pantoprazole (PROTONIX) 40 MG tablet Take 1 tablet (40 mg total) by mouth daily.  . simvastatin (ZOCOR) 40 MG tablet Take 1 tablet (40 mg total) by mouth at bedtime.  . [DISCONTINUED] doxycycline (VIBRA-TABS) 100 MG tablet Take 1 tablet (100 mg total) by mouth 2 (two) times daily.  . [DISCONTINUED] HYDROcodone-homatropine (HYCODAN) 5-1.5 MG/5ML syrup Take 5 mLs by mouth every 6 (six) hours as needed for cough.  . [DISCONTINUED] levofloxacin (LEVAQUIN) 500 MG tablet Take 1 tablet (500 mg total) by mouth daily.  . [DISCONTINUED] methylPREDNIsolone (MEDROL DOSPACK) 4 MG tablet     Allergies  Allergen Reactions  . Erythromycin   . Sulfamethoxazole     REACTION: unspecified    Past Medical History  Diagnosis Date  . Headache(784.0)   . Hypertension   . Myocardial infarct     hx of  . Hyperlipidemia   . Menopausal syndrome   . Allergic rhinitis   . Low back pain   . Overactive bladder     ROS: Negative except as per HPI  BP 144/78  Pulse 72  Ht 5\' 5"  (1.651 m)  Wt 83.643 kg (184 lb 6.4 oz)  BMI 30.69 kg/m2  PHYSICAL EXAM: Pt is alert and oriented, pleasant overweight woman in NAD HEENT: normal Neck: JVP - normal, carotids 2+= without bruits Lungs: CTA bilaterally CV: RRR without murmur  or gallop Abd: soft, NT, Positive BS, no hepatomegaly Ext: no C/C/E, distal pulses intact and equal Skin: warm/dry no rash  EKG:  Normal sinus rhythm, possible left atrial enlargement, nonspecific ST abnormality.  2-D echocardiogram 2010: Study Conclusions  - Left ventricle: The cavity size was normal. Wall thickness was increased in a pattern of mild LVH. Systolic function was normal. The estimated ejection fraction was in the range of 50% to 55%. There is hypokinesis of the posterior wall. Doppler parameters  are consistent with abnormal left ventricular relaxation (grade 1 diastolic dysfunction). - Mitral valve: Calcified annulus. Mild regurgitation. - Pulmonary arteries: Systolic pressure was mildly increased.  ASSESSMENT AND PLAN: 1. Coronary artery disease, native vessel. The patient is stable without symptoms of angina. I reviewed her cardiac catheterization reports. There is no long-term indication for Plavix. I recommended that she discontinue this. She should remain on lifelong aspirin. I will see her back in one year.  2. Hyperlipidemia. Most recent lipids reviewed. She is above goal and lipids are worse on last years values. We discussed options. She would like to make an attempt at lifestyle modification over a three-month period with focus on eating better and exercising. She will continue on simvastatin 40 mg. Will check a lipid panel in 3 months.  3. Hypertension. Blood pressure control is suboptimal based on our office readings. She has not checked home blood pressures as advised by Dr. Raliegh Ip. Advised to check home blood pressures over 2 weeks and call either me or Dr. Raliegh Ip. with results if they are greater than 140/90.  I will see her back in one year for followup. Sherren Mocha 04/15/2014 8:50 AM

## 2014-04-15 NOTE — Patient Instructions (Signed)
Your physician has recommended you make the following change in your medication: STOP Plavix  Your physician recommends that you return for a FASTING lipid profile in 3 MONTHS--nothing to eat or drink after midnight, lab opens at 7:30 AM  Your physician has requested that you regularly monitor and record your blood pressure readings at home. Please use the same machine at the same time of day to check your readings and record them to bring to your follow-up visit. Please call the office in a few weeks if your BP is consistently above 140/90.   Your physician discussed the importance of regular exercise and recommended that you start or continue a regular exercise program for good health.  Your physician wants you to follow-up in: 1 YEAR with Dr Burt Knack.  You will receive a reminder letter in the mail two months in advance. If you don't receive a letter, please call our office to schedule the follow-up appointment.  Fat and Cholesterol Control Diet Fat and cholesterol levels in your blood and organs are influenced by your diet. High levels of fat and cholesterol may lead to diseases of the heart, small and large blood vessels, gallbladder, liver, and pancreas. CONTROLLING FAT AND CHOLESTEROL WITH DIET Although exercise and lifestyle factors are important, your diet is key. That is because certain foods are known to raise cholesterol and others to lower it. The goal is to balance foods for their effect on cholesterol and more importantly, to replace saturated and trans fat with other types of fat, such as monounsaturated fat, polyunsaturated fat, and omega-3 fatty acids. On average, a person should consume no more than 15 to 17 g of saturated fat daily. Saturated and trans fats are considered "bad" fats, and they will raise LDL cholesterol. Saturated fats are primarily found in animal products such as meats, butter, and cream. However, that does not mean you need to give up all your favorite foods. Today,  there are good tasting, low-fat, low-cholesterol substitutes for most of the things you like to eat. Choose low-fat or nonfat alternatives. Choose round or loin cuts of red meat. These types of cuts are lowest in fat and cholesterol. Chicken (without the skin), fish, veal, and ground [place] breast are great choices. Eliminate fatty meats, such as hot dogs and salami. Even shellfish have little or no saturated fat. Have a 3 oz (85 g) portion when you eat lean meat, poultry, or fish. Trans fats are also called "partially hydrogenated oils." They are oils that have been scientifically manipulated so that they are solid at room temperature resulting in a longer shelf life and improved taste and texture of foods in which they are added. Trans fats are found in stick margarine, some tub margarines, cookies, crackers, and baked goods.  When baking and cooking, oils are a great substitute for butter. The monounsaturated oils are especially beneficial since it is believed they lower LDL and raise HDL. The oils you should avoid entirely are saturated tropical oils, such as coconut and palm.  Remember to eat a lot from food groups that are naturally free of saturated and trans fat, including fish, fruit, vegetables, beans, grains (barley, rice, couscous, bulgur wheat), and pasta (without cream sauces).  IDENTIFYING FOODS THAT LOWER FAT AND CHOLESTEROL  Soluble fiber may lower your cholesterol. This type of fiber is found in fruits such as apples, vegetables such as broccoli, potatoes, and carrots, legumes such as beans, peas, and lentils, and grains such as barley. Foods fortified with plant sterols (  phytosterol) may also lower cholesterol. You should eat at least 2 g per day of these foods for a cholesterol lowering effect.  Read package labels to identify low-saturated fats, trans fat free, and low-fat foods at the supermarket. Select cheeses that have only 2 to 3 g saturated fat per ounce. Use a heart-healthy tub  margarine that is free of trans fats or partially hydrogenated oil. When buying baked goods (cookies, crackers), avoid partially hydrogenated oils. Breads and muffins should be made from whole grains (whole-wheat or whole oat flour, instead of "flour" or "enriched flour"). Buy non-creamy canned soups with reduced salt and no added fats.  FOOD PREPARATION TECHNIQUES  Never deep-fry. If you must fry, either stir-fry, which uses very little fat, or use non-stick cooking sprays. When possible, broil, bake, or roast meats, and steam vegetables. Instead of putting butter or margarine on vegetables, use lemon and herbs, applesauce, and cinnamon (for squash and sweet potatoes). Use nonfat yogurt, salsa, and low-fat dressings for salads.  LOW-SATURATED FAT / LOW-FAT FOOD SUBSTITUTES Meats / Saturated Fat (g)  Avoid: Steak, marbled (3 oz/85 g) / 11 g  Choose: Steak, lean (3 oz/85 g) / 4 g  Avoid: Hamburger (3 oz/85 g) / 7 g  Choose: Hamburger, lean (3 oz/85 g) / 5 g  Avoid: Ham (3 oz/85 g) / 6 g  Choose: Ham, lean cut (3 oz/85 g) / 2.4 g  Avoid: Chicken, with skin, dark meat (3 oz/85 g) / 4 g  Choose: Chicken, skin removed, dark meat (3 oz/85 g) / 2 g  Avoid: Chicken, with skin, light meat (3 oz/85 g) / 2.5 g  Choose: Chicken, skin removed, light meat (3 oz/85 g) / 1 g Dairy / Saturated Fat (g)  Avoid: Whole milk (1 cup) / 5 g  Choose: Low-fat milk, 2% (1 cup) / 3 g  Choose: Low-fat milk, 1% (1 cup) / 1.5 g  Choose: Skim milk (1 cup) / 0.3 g  Avoid: Hard cheese (1 oz/28 g) / 6 g  Choose: Skim milk cheese (1 oz/28 g) / 2 to 3 g  Avoid: Cottage cheese, 4% fat (1 cup) / 6.5 g  Choose: Low-fat cottage cheese, 1% fat (1 cup) / 1.5 g  Avoid: Ice cream (1 cup) / 9 g  Choose: Sherbet (1 cup) / 2.5 g  Choose: Nonfat frozen yogurt (1 cup) / 0.3 g  Choose: Frozen fruit bar / trace  Avoid: Whipped cream (1 tbs) / 3.5 g  Choose: Nondairy whipped topping (1 tbs) / 1 g Condiments /  Saturated Fat (g)  Avoid: Mayonnaise (1 tbs) / 2 g  Choose: Low-fat mayonnaise (1 tbs) / 1 g  Avoid: Butter (1 tbs) / 7 g  Choose: Extra light margarine (1 tbs) / 1 g  Avoid: Coconut oil (1 tbs) / 11.8 g  Choose: Olive oil (1 tbs) / 1.8 g  Choose: Corn oil (1 tbs) / 1.7 g  Choose: Safflower oil (1 tbs) / 1.2 g  Choose: Sunflower oil (1 tbs) / 1.4 g  Choose: Soybean oil (1 tbs) / 2.4 g  Choose: Canola oil (1 tbs) / 1 g Document Released: 10/03/2005 Document Revised: 01/28/2013 Document Reviewed: 03/24/2011 [employer] Patient Information 2015 [place], [place]. This information is not intended to replace advice given to you by your health care provider. Make sure you discuss any questions you have with your health care provider.

## 2014-04-22 ENCOUNTER — Ambulatory Visit (INDEPENDENT_AMBULATORY_CARE_PROVIDER_SITE_OTHER): Payer: Medicare Other | Admitting: Internal Medicine

## 2014-04-22 ENCOUNTER — Encounter: Payer: Self-pay | Admitting: Internal Medicine

## 2014-04-22 VITALS — BP 170/100 | HR 77 | Temp 97.7°F | Resp 20 | Ht 65.0 in | Wt 187.0 lb

## 2014-04-22 DIAGNOSIS — I252 Old myocardial infarction: Secondary | ICD-10-CM

## 2014-04-22 DIAGNOSIS — I2510 Atherosclerotic heart disease of native coronary artery without angina pectoris: Secondary | ICD-10-CM | POA: Diagnosis not present

## 2014-04-22 DIAGNOSIS — R51 Headache: Secondary | ICD-10-CM | POA: Diagnosis not present

## 2014-04-22 DIAGNOSIS — E7800 Pure hypercholesterolemia, unspecified: Secondary | ICD-10-CM | POA: Diagnosis not present

## 2014-04-22 DIAGNOSIS — I10 Essential (primary) hypertension: Secondary | ICD-10-CM | POA: Diagnosis not present

## 2014-04-22 MED ORDER — LISINOPRIL 20 MG PO TABS: 20.0000 mg | ORAL_TABLET | Freq: Every day | ORAL | Status: DC

## 2014-04-22 MED ORDER — HYDROCODONE-ACETAMINOPHEN 5-325 MG PO TABS: ORAL_TABLET | ORAL | Status: DC

## 2014-04-22 NOTE — Progress Notes (Signed)
Pre visit review using our clinic review tool, if applicable. No additional management support is needed unless otherwise documented below in the visit note. 

## 2014-04-22 NOTE — Patient Instructions (Signed)
Limit your sodium (Salt) intake    It is important that you exercise regularly, at least 20 minutes 3 to 4 times per week.  If you develop chest pain or shortness of breath seek  medical attention.  You need to lose weight.  Consider a lower calorie diet and regular exercise.  Please check your blood pressure on a regular basis.  If it is consistently greater than 150/90, please make an office appointment.

## 2014-04-22 NOTE — Progress Notes (Signed)
Subjective:    Patient ID: Tracy [name], female    DOB: 1946-04-22, 68 y.o.   MRN: [id number]  HPI BP Readings from Last 3 Encounters:  04/22/14 170/100  04/15/14 144/78  02/18/14 65/22   68 year old patient who is seen today for followup of hypertension.  Blood pressures have run a bit high, and she has purchased her own home.  Blood pressure monitor with consistently high readings.  She was seen by cardiology recently, with borderline high readings as well.  She complains of some headaches today. Blood pressure today is high as 170 over 110 with good correlation with her home blood pressure monitor.  Past Medical History  Diagnosis Date  . Headache(784.0)   . Hypertension   . Myocardial infarct     hx of  . Hyperlipidemia   . Menopausal syndrome   . Allergic rhinitis   . Low back pain   . Overactive bladder     History   Social History  . Marital Status: Married    Spouse Name: N/A    Number of Children: N/A  . 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 Smoking status: Former Smoker    Quit date: 07/01/2012  . Smokeless tobacco: Never Used     Comment: electronic cigarettes  . Alcohol Use: No  . Drug Use: No  . Sexual Activity: Not on file   Other Topics Concern  . Not on file   Social History Narrative  . No narrative on file    Past Surgical History  Procedure Laterality Date  . Tonsillectomy    . Angioplasty      stent 2007    Family History  Problem Relation Age of Onset  . Adopted: Yes  . Other      patient is adopted    Allergies  Allergen Reactions  . Erythromycin   . Sulfamethoxazole     REACTION: unspecified    Current Outpatient Prescriptions on File Prior to Visit  Medication Sig Dispense Refill  . ALPRAZolam (XANAX) 0.25 MG tablet TAKE 1 TABLET BY MOUTH TWICE A DAY AS NEEDED FOR ANXIETY  60 tablet  2  . aspirin 81 MG tablet Take 81 mg by mouth  daily.        . cholecalciferol (VITAMIN D) 1000 UNITS tablet Take 1,000 Units by mouth daily.        . cyanocobalamin 2000 MCG tablet Take 2,500 mcg by mouth daily.       . metoprolol (LOPRESSOR) 100 MG tablet TAKE 1 TABLET (100 MG TOTAL) BY MOUTH 2 (TWO) TIMES DAILY. (NEED APPOINTMENT FOR FURTHER REFILLS)  180 tablet  1  . Multiple Vitamin (MULTIVITAMIN) tablet Take 1 tablet by mouth daily.        . nitroGLYCERIN (NITROSTAT) 0.4 MG SL tablet Place 1 tablet (0.4 mg total) under the tongue every 5 (five) minutes as needed.  15 tablet  6  . Omega-3 Fatty Acids (FISH OIL) 1000 MG CAPS Take by mouth.        . pantoprazole (PROTONIX) 40 MG tablet Take 1 tablet (40 mg total) by mouth daily.  90 tablet  3  . simvastatin (ZOCOR) 40 MG tablet Take 1 tablet (40 mg total) by mouth at bedtime.  90 tablet  3   No current facility-administered medications on file prior to visit.    BP 170/100  Pulse 77  Temp(Src)  97.7 F (36.5 C) (Oral)  Resp 20  Ht 5\' 5"  (1.651 m)  Wt 187 lb (84.823 kg)  BMI 31.12 kg/m2  SpO2 95%   8  Review of Systems  Constitutional: Negative.   HENT: Negative for congestion, dental problem, hearing loss, rhinorrhea, sinus pressure, sore throat and tinnitus.   Eyes: Negative for pain, discharge and visual disturbance.  Respiratory: Negative for cough and shortness of breath.   Cardiovascular: Negative for chest pain, palpitations and leg swelling.  Gastrointestinal: Negative for nausea, vomiting, abdominal pain, diarrhea, constipation, blood in stool and abdominal distention.  Genitourinary: Negative for dysuria, urgency, frequency, hematuria, flank pain, vaginal bleeding, vaginal discharge, difficulty urinating, vaginal pain and pelvic pain.  Musculoskeletal: Negative for arthralgias, gait problem and joint swelling.  Skin: Negative for rash.  Neurological: Positive for headaches. Negative for dizziness, syncope, speech difficulty, weakness and numbness.  Hematological:  Negative for adenopathy.  Psychiatric/Behavioral: Negative for behavioral problems, dysphoric mood and agitation. The patient is not nervous/anxious.        Objective:   Physical Exam  Constitutional: She is oriented to person, place, and time. She appears well-developed and well-nourished.  Blood pressure 170 over 110  HENT:  Head: Normocephalic.  Right Ear: External ear normal.  Left Ear: External ear normal.  Mouth/Throat: Oropharynx is clear and moist.  Eyes: Conjunctivae and EOM are normal. Pupils are equal, round, and reactive to light.  Neck: Normal range of motion. Neck supple. No thyromegaly present.  Cardiovascular: Normal rate, regular rhythm, normal heart sounds and intact distal pulses.   Pulmonary/Chest: Effort normal and breath sounds normal.  Abdominal: Soft. Bowel sounds are normal. She exhibits no mass. There is no tenderness.  Musculoskeletal: Normal range of motion.  Lymphadenopathy:    She has no cervical adenopathy.  Neurological: She is alert and oriented to person, place, and time.  Skin: Skin is warm and dry. No rash noted.  Psychiatric: She has a normal mood and affect. Her behavior is normal.          Assessment & Plan:  Hypertension, suboptimal control.  We'll add lisinopril 20 mg daily.  Continue metoprolol Coronary artery disease, headache  Recheck 4 weeks Low salt diet recommended Exercise regimen.  Encouraged modest weight loss encouraged

## 2014-04-24 ENCOUNTER — Encounter: Payer: Self-pay | Admitting: Internal Medicine

## 2014-04-24 ENCOUNTER — Telehealth: Payer: Self-pay | Admitting: Internal Medicine

## 2014-04-24 ENCOUNTER — Ambulatory Visit (INDEPENDENT_AMBULATORY_CARE_PROVIDER_SITE_OTHER): Payer: Medicare Other | Admitting: Internal Medicine

## 2014-04-24 VITALS — BP 182/120 | HR 75 | Temp 98.3°F | Resp 20 | Ht 65.0 in | Wt 190.0 lb

## 2014-04-24 DIAGNOSIS — I10 Essential (primary) hypertension: Secondary | ICD-10-CM | POA: Diagnosis not present

## 2014-04-24 DIAGNOSIS — I2510 Atherosclerotic heart disease of native coronary artery without angina pectoris: Secondary | ICD-10-CM | POA: Diagnosis not present

## 2014-04-24 DIAGNOSIS — I252 Old myocardial infarction: Secondary | ICD-10-CM

## 2014-04-24 MED ORDER — HYDROCHLOROTHIAZIDE 25 MG PO TABS: 25.0000 mg | ORAL_TABLET | Freq: Every day | ORAL | Status: DC

## 2014-04-24 NOTE — Telephone Encounter (Signed)
Patient Information:  Caller Name: Der  Phone: [phone]  Patient: Tracy [name], Tracy [name]  Gender: Female  DOB: 12/14/1945  Age: 68 Years  PCP: Bluford Kaufmann (Family Practice > 57yrs old)  Office Follow Up:  Does the office need to follow up with this patient?: No  Instructions For The Office: N/A  RN Note:  Per disp contacted the office and advised appt today, scheduled appt 1100, 04/24/14 with Dr Denman George.  Symptoms  Reason For Call & Symptoms: Pt reports she was seen in the office 04/22/14 and given Rx for blood pressure.  Pt states her blood pressure is high  0822 185/121 and 0930 182/136.  Pt reports BP readings are after taking her BP medications.  Pt reports she has HA, SOB.  Pt also reports 04/23/14 BP lowest 133/100  and highest 166/110 for the day.  Reviewed Health History In EMR: Yes  Reviewed Medications In EMR: Yes  Reviewed Allergies In EMR: Yes  Reviewed Surgeries / Procedures: Yes  Date of Onset of Symptoms: 04/22/2014  Guideline(s) Used:  High Blood Pressure  Disposition Per Guideline:   Go to ED Now (or to Office with PCP Approval)  Reason For Disposition Reached:   BP > 160 / 100 and any cardiac or neurologic symptoms (e.g., chest pain, difficulty breathing, unsteady gait, blurred vision)  Advice Given:  Call Back If:  You become worse.  Patient Will Follow Care Advice:  YES  Appointment Scheduled:  04/24/2014 11:00:00 Appointment Scheduled Provider:  Bluford Kaufmann (Family Practice > 32yrs old)

## 2014-04-24 NOTE — Progress Notes (Signed)
Subjective:    Patient ID: Tracy [name], female    DOB: 1946-02-13, 68 y.o.   MRN: [id number]  HPI  68 year old patient who is seen today for followup of hypertension.  She was seen here 2 days ago, and due to a status 2 hypertension.  Lisinopril 20 mg was added to her regimen.  2 days ago.  She had 2 blood pressure readings that were actually in a low-normal range, but subsequent blood pressures have been moderately high.  She's had readings at home as high as 182 over 136.  Blood pressure today 182 over 120 on arrival. She generally feels well;  has been walking more in the hopes of the improvement.  Her blood pressure readings  Past Medical History  Diagnosis Date  . Headache(784.0)   . Hypertension   . Myocardial infarct     hx of  . Hyperlipidemia   . Menopausal syndrome   . Allergic rhinitis   . Low back pain   . Overactive bladder     History   Social History  . Marital Status: Married    Spouse Name: N/A    Number of Children: N/A  . 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 Smoking status: Former Smoker    Quit date: 07/01/2012  . Smokeless tobacco: Never Used     Comment: electronic cigarettes  . Alcohol Use: No  . Drug Use: No  . Sexual Activity: Not on file   Other Topics Concern  . Not on file   Social History Narrative  . No narrative on file    Past Surgical History  Procedure Laterality Date  . Tonsillectomy    . Angioplasty      stent 2007    Family History  Problem Relation Age of Onset  . Adopted: Yes  . Other      patient is adopted    Allergies  Allergen Reactions  . Erythromycin   . Sulfamethoxazole     REACTION: unspecified    Current Outpatient Prescriptions on File Prior to Visit  Medication Sig Dispense Refill  . ALPRAZolam (XANAX) 0.25 MG tablet TAKE 1 TABLET BY MOUTH TWICE A DAY AS NEEDED FOR ANXIETY  60 tablet  2  . aspirin 81 MG  tablet Take 81 mg by mouth daily.        . cholecalciferol (VITAMIN D) 1000 UNITS tablet Take 1,000 Units by mouth daily.        . clopidogrel (PLAVIX) 75 MG tablet Take 75 mg by mouth daily.       . cyanocobalamin 2000 MCG tablet Take 2,500 mcg by mouth daily.       Marland Kitchen HYDROcodone-acetaminophen (NORCO/VICODIN) 5-325 MG per tablet TAKE 1 TABLET EVERY 6 HOURS AS NEEDED FOR PAIN  60 tablet  0  . lisinopril (PRINIVIL,ZESTRIL) 20 MG tablet Take 1 tablet (20 mg total) by mouth daily.  90 tablet  3  . metoprolol (LOPRESSOR) 100 MG tablet TAKE 1 TABLET (100 MG TOTAL) BY MOUTH 2 (TWO) TIMES DAILY. (NEED APPOINTMENT FOR FURTHER REFILLS)  180 tablet  1  . Multiple Vitamin (MULTIVITAMIN) tablet Take 1 tablet by mouth daily.        . nitroGLYCERIN (NITROSTAT) 0.4 MG SL tablet Place 1 tablet (0.4 mg total) under the tongue every 5 (five) minutes as needed.  15 tablet  6  . Omega-3 Fatty Acids (FISH OIL)  1000 MG CAPS Take by mouth.        . pantoprazole (PROTONIX) 40 MG tablet Take 1 tablet (40 mg total) by mouth daily.  90 tablet  3  . simvastatin (ZOCOR) 40 MG tablet Take 1 tablet (40 mg total) by mouth at bedtime.  90 tablet  3   No current facility-administered medications on file prior to visit.    BP 182/120  Pulse 75  Temp(Src) 98.3 F (36.8 C) (Oral)  Resp 20  Ht 5\' 5"  (1.651 m)  Wt 190 lb (86.183 kg)  BMI 31.62 kg/m2  SpO2 98%     Review of Systems  Constitutional: Negative.   HENT: Negative for congestion, dental problem, hearing loss, rhinorrhea, sinus pressure, sore throat and tinnitus.   Eyes: Negative for pain, discharge and visual disturbance.  Respiratory: Negative for cough and shortness of breath.   Cardiovascular: Negative for chest pain, palpitations and leg swelling.  Gastrointestinal: Negative for nausea, vomiting, abdominal pain, diarrhea, constipation, blood in stool and abdominal distention.  Genitourinary: Negative for dysuria, urgency, frequency, hematuria, flank pain,  vaginal bleeding, vaginal discharge, difficulty urinating, vaginal pain and pelvic pain.  Musculoskeletal: Negative for arthralgias, gait problem and joint swelling.  Skin: Negative for rash.  Neurological: Negative for dizziness, syncope, speech difficulty, weakness, numbness and headaches.  Hematological: Negative for adenopathy.  Psychiatric/Behavioral: Negative for behavioral problems, dysphoric mood and agitation. The patient is not nervous/anxious.        Objective:   Physical Exam  Constitutional: She appears well-developed and well-nourished. No distress.  Blood pressure 180 over 110  Cardiovascular: Normal rate and regular rhythm.   Pulmonary/Chest: Effort normal and breath sounds normal. No respiratory distress.  Musculoskeletal: She exhibits no edema.          Assessment & Plan:   Hypertension.  Poor control.  We'll add hydrochlorothiazide 25 mg daily.  Continue home blood pressure monitoring.  Recheck in one week

## 2014-04-24 NOTE — Patient Instructions (Signed)
Limit your sodium (Salt) intake  Please check your blood pressure on a regular basis.  If it is consistently greater than 150/90, please make an office appointment.  Return in one week for followup

## 2014-04-24 NOTE — Telephone Encounter (Signed)
Pt was seen on Tuesday and dr. Raliegh Ip had given her an rx for bp, however pt's bp is still elevated states she checked it this morning and it was 182/136 and 185/121, pt is concerned states her head is still hurting. Pt was transferred to triage.

## 2014-04-24 NOTE — Progress Notes (Signed)
Pre visit review using our clinic review tool, if applicable. No additional management support is needed unless otherwise documented below in the visit note. 

## 2014-04-24 NOTE — Telephone Encounter (Signed)
Noted  

## 2014-04-25 NOTE — Telephone Encounter (Signed)
Pt was seen yesterday.

## 2014-05-01 ENCOUNTER — Ambulatory Visit (INDEPENDENT_AMBULATORY_CARE_PROVIDER_SITE_OTHER): Payer: Medicare Other | Admitting: Internal Medicine

## 2014-05-01 ENCOUNTER — Encounter: Payer: Self-pay | Admitting: Internal Medicine

## 2014-05-01 VITALS — BP 124/80 | HR 79 | Temp 97.8°F | Resp 20 | Ht 65.0 in | Wt 186.0 lb

## 2014-05-01 DIAGNOSIS — I10 Essential (primary) hypertension: Secondary | ICD-10-CM

## 2014-05-01 DIAGNOSIS — I2510 Atherosclerotic heart disease of native coronary artery without angina pectoris: Secondary | ICD-10-CM | POA: Diagnosis not present

## 2014-05-01 NOTE — Patient Instructions (Signed)
Limit your sodium (Salt) intake  Please check your blood pressure on a regular basis.  If it is consistently greater than 150/90, please make an office appointment.  Return in 3 months for follow-up  

## 2014-05-01 NOTE — Progress Notes (Signed)
Subjective:    Patient ID: Tracy [name], female    DOB: September 04, 1946, 68 y.o.   MRN: [id number]  HPI  68 year old patient who is seen today for followup of hypertension.  Recently, blood pressure has been quite labile with readings in a moderately high range.  7 days ago hydrochlorothiazide was added to her regimen.  Blood pressure readings have been very well controlled and occasionally in a low-normal range.  She feels well although believes she may developing a URI.  Her husband has been ill  Past Medical History  Diagnosis Date  . Headache(784.0)   . Hypertension   . Myocardial infarct     hx of  . Hyperlipidemia   . Menopausal syndrome   . Allergic rhinitis   . Low back pain   . Overactive bladder     History   Social History  . Marital Status: Married    Spouse Name: N/A    Number of Children: N/A  . 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 Smoking status: Former Smoker    Quit date: 07/01/2012  . Smokeless tobacco: Never Used     Comment: electronic cigarettes  . Alcohol Use: No  . Drug Use: No  . Sexual Activity: Not on file   Other Topics Concern  . Not on file   Social History Narrative  . No narrative on file    Past Surgical History  Procedure Laterality Date  . Tonsillectomy    . Angioplasty      stent 2007    Family History  Problem Relation Age of Onset  . Adopted: Yes  . Other      patient is adopted    Allergies  Allergen Reactions  . Erythromycin   . Sulfamethoxazole     REACTION: unspecified    Current Outpatient Prescriptions on File Prior to Visit  Medication Sig Dispense Refill  . ALPRAZolam (XANAX) 0.25 MG tablet TAKE 1 TABLET BY MOUTH TWICE A DAY AS NEEDED FOR ANXIETY  60 tablet  2  . aspirin 81 MG tablet Take 81 mg by mouth daily.        . cholecalciferol (VITAMIN D) 1000 UNITS tablet Take 1,000 Units by mouth daily.        . clopidogrel  (PLAVIX) 75 MG tablet Take 75 mg by mouth daily.       . cyanocobalamin 2000 MCG tablet Take 2,500 mcg by mouth daily.       . hydrochlorothiazide (HYDRODIURIL) 25 MG tablet Take 1 tablet (25 mg total) by mouth daily.  90 tablet  3  . HYDROcodone-acetaminophen (NORCO/VICODIN) 5-325 MG per tablet TAKE 1 TABLET EVERY 6 HOURS AS NEEDED FOR PAIN  60 tablet  0  . lisinopril (PRINIVIL,ZESTRIL) 20 MG tablet Take 1 tablet (20 mg total) by mouth daily.  90 tablet  3  . metoprolol (LOPRESSOR) 100 MG tablet TAKE 1 TABLET (100 MG TOTAL) BY MOUTH 2 (TWO) TIMES DAILY. (NEED APPOINTMENT FOR FURTHER REFILLS)  180 tablet  1  . Multiple Vitamin (MULTIVITAMIN) tablet Take 1 tablet by mouth daily.        . nitroGLYCERIN (NITROSTAT) 0.4 MG SL tablet Place 1 tablet (0.4 mg total) under the tongue every 5 (five) minutes as needed.  15 tablet  6  . Omega-3 Fatty Acids (FISH OIL) 1000 MG CAPS Take by mouth.        Marland Kitchen  pantoprazole (PROTONIX) 40 MG tablet Take 1 tablet (40 mg total) by mouth daily.  90 tablet  3  . simvastatin (ZOCOR) 40 MG tablet Take 1 tablet (40 mg total) by mouth at bedtime.  90 tablet  3   No current facility-administered medications on file prior to visit.    BP 124/80  Pulse 79  Temp(Src) 97.8 F (36.6 C) (Oral)  Resp 20  Ht 5\' 5"  (1.651 m)  Wt 186 lb (84.369 kg)  BMI 30.95 kg/m2  SpO2 95%     Review of Systems  Constitutional: Negative.   HENT: Negative for congestion, dental problem, hearing loss, rhinorrhea, sinus pressure, sore throat and tinnitus.   Eyes: Negative for pain, discharge and visual disturbance.  Respiratory: Positive for cough. Negative for shortness of breath.   Cardiovascular: Negative for chest pain, palpitations and leg swelling.  Gastrointestinal: Negative for nausea, vomiting, abdominal pain, diarrhea, constipation, blood in stool and abdominal distention.  Genitourinary: Negative for dysuria, urgency, frequency, hematuria, flank pain, vaginal bleeding, vaginal  discharge, difficulty urinating, vaginal pain and pelvic pain.  Musculoskeletal: Negative for arthralgias, gait problem and joint swelling.  Skin: Negative for rash.  Neurological: Negative for dizziness, syncope, speech difficulty, weakness, numbness and headaches.  Hematological: Negative for adenopathy.  Psychiatric/Behavioral: Negative for behavioral problems, dysphoric mood and agitation. The patient is not nervous/anxious.        Objective:   Physical Exam  Constitutional:  Blood pressure 130/80  Pulmonary/Chest:  A few bibasilar crackles          Assessment & Plan:  Hypertension improved.  We'll continue present medical therapy.  Recheck 3 months low salt diet.  Recommended exercise, weight loss.  All encouraged

## 2014-05-01 NOTE — Progress Notes (Signed)
Pre visit review using our clinic review tool, if applicable. No additional management support is needed unless otherwise documented below in the visit note. 

## 2014-05-07 ENCOUNTER — Encounter: Payer: Self-pay | Admitting: Internal Medicine

## 2014-05-20 ENCOUNTER — Ambulatory Visit: Payer: Medicare Other | Admitting: Internal Medicine

## 2014-05-22 ENCOUNTER — Encounter: Payer: Self-pay | Admitting: Internal Medicine

## 2014-06-02 ENCOUNTER — Other Ambulatory Visit: Payer: Self-pay | Admitting: Internal Medicine

## 2014-08-01 ENCOUNTER — Ambulatory Visit: Payer: Medicare Other | Admitting: Internal Medicine

## 2014-08-12 ENCOUNTER — Ambulatory Visit (INDEPENDENT_AMBULATORY_CARE_PROVIDER_SITE_OTHER): Payer: Medicare Other | Admitting: Internal Medicine

## 2014-08-12 ENCOUNTER — Other Ambulatory Visit: Payer: Medicare Other

## 2014-08-12 ENCOUNTER — Encounter: Payer: Self-pay | Admitting: Internal Medicine

## 2014-08-12 VITALS — BP 110/72 | HR 80 | Temp 98.3°F | Resp 20 | Ht 65.0 in | Wt 178.0 lb

## 2014-08-12 DIAGNOSIS — B9789 Other viral agents as the cause of diseases classified elsewhere: Principal | ICD-10-CM

## 2014-08-12 DIAGNOSIS — I2510 Atherosclerotic heart disease of native coronary artery without angina pectoris: Secondary | ICD-10-CM

## 2014-08-12 DIAGNOSIS — J069 Acute upper respiratory infection, unspecified: Secondary | ICD-10-CM | POA: Diagnosis not present

## 2014-08-12 MED ORDER — HYDROCODONE-ACETAMINOPHEN 5-325 MG PO TABS: ORAL_TABLET | ORAL | Status: DC

## 2014-08-12 MED ORDER — HYDROCODONE-HOMATROPINE 5-1.5 MG/5ML PO SYRP: 5.0000 mL | ORAL_SOLUTION | Freq: Four times a day (QID) | ORAL | Status: DC | PRN

## 2014-08-12 NOTE — Progress Notes (Signed)
Subjective:    Patient ID: Tracy [name], female    DOB: [birth year], 68 y.o.   MRN: [id number]  HPI  68 year old patient who presents with a one-week history of cold symptoms.  She initially started with sinus congestion and more recently has developed chest congestion and cough.  Cough is incessant but largely nonproductive.  She has had some occasional low-grade fever.  Symptoms have been refractory to outpatient therapies.  She has been exposed to a number of individuals with cold symptoms.  Past Medical History  Diagnosis Date  . Headache(784.0)   . Hypertension   . Myocardial infarct     hx of  . Hyperlipidemia   . Menopausal syndrome   . Allergic rhinitis   . Low back pain   . Overactive bladder     History   Social History  . Marital Status: Married    Spouse Name: N/A    Number of Children: N/A  . 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 Smoking status: Former Smoker    Quit date: 07/01/2012  . Smokeless tobacco: Never Used     Comment: electronic cigarettes  . Alcohol Use: No  . Drug Use: No  . Sexual Activity: Not on file   Other Topics Concern  . Not on file   Social History Narrative  . No narrative on file    Past Surgical History  Procedure Laterality Date  . Tonsillectomy    . Angioplasty      stent 2007    Family History  Problem Relation Age of Onset  . Adopted: Yes  . Other      patient is adopted    Allergies  Allergen Reactions  . Erythromycin   . Sulfamethoxazole     REACTION: unspecified    Current Outpatient Prescriptions on File Prior to Visit  Medication Sig Dispense Refill  . ALPRAZolam (XANAX) 0.25 MG tablet TAKE 1 TABLET BY MOUTH TWICE A DAY AS NEEDED FOR ANXIETY  60 tablet  2  . aspirin 81 MG tablet Take 81 mg by mouth daily.        . cholecalciferol (VITAMIN D) 1000 UNITS tablet Take 1,000 Units by mouth daily.        . clopidogrel  (PLAVIX) 75 MG tablet Take 75 mg by mouth daily.       . cyanocobalamin 2000 MCG tablet Take 2,500 mcg by mouth daily.       . hydrochlorothiazide (HYDRODIURIL) 25 MG tablet Take 1 tablet (25 mg total) by mouth daily.  90 tablet  3  . lisinopril (PRINIVIL,ZESTRIL) 20 MG tablet Take 1 tablet (20 mg total) by mouth daily.  90 tablet  3  . metoprolol (LOPRESSOR) 100 MG tablet TAKE 1 TABLET (100 MG TOTAL) BY MOUTH 2 (TWO) TIMES DAILY. (NEED APPOINTMENT FOR FURTHER REFILLS)  180 tablet  1  . Multiple Vitamin (MULTIVITAMIN) tablet Take 1 tablet by mouth daily.        . nitroGLYCERIN (NITROSTAT) 0.4 MG SL tablet Place 1 tablet (0.4 mg total) under the tongue every 5 (five) minutes as needed.  15 tablet  6  . Omega-3 Fatty Acids (FISH OIL) 1000 MG CAPS Take by mouth.        . pantoprazole (PROTONIX) 40 MG tablet Take 1 tablet (40 mg total) by mouth daily.  90 tablet  3  . simvastatin (ZOCOR) 40 MG tablet  Take 1 tablet (40 mg total) by mouth at bedtime.  90 tablet  3   No current facility-administered medications on file prior to visit.    BP 110/72  Pulse 80  Temp(Src) 98.3 F (36.8 C) (Oral)  Resp 20  Ht 5\' 5"  (1.651 m)  Wt 178 lb (80.74 kg)  BMI 29.62 kg/m2  SpO2 95%      Review of Systems  Constitutional: Positive for fever, activity change, appetite change and fatigue. Negative for chills and diaphoresis.  HENT: Positive for congestion. Negative for dental problem, hearing loss, rhinorrhea, sinus pressure, sore throat and tinnitus.   Eyes: Negative for pain, discharge and visual disturbance.  Respiratory: Positive for cough. Negative for shortness of breath.   Cardiovascular: Negative for chest pain, palpitations and leg swelling.  Gastrointestinal: Negative for nausea, vomiting, abdominal pain, diarrhea, constipation, blood in stool and abdominal distention.  Genitourinary: Negative for dysuria, urgency, frequency, hematuria, flank pain, vaginal bleeding, vaginal discharge, difficulty  urinating, vaginal pain and pelvic pain.  Musculoskeletal: Negative for arthralgias, gait problem and joint swelling.  Skin: Negative for rash.  Neurological: Negative for dizziness, syncope, speech difficulty, weakness, numbness and headaches.  Hematological: Negative for adenopathy.  Psychiatric/Behavioral: Negative for behavioral problems, dysphoric mood and agitation. The patient is not nervous/anxious.        Objective:   Physical Exam  Constitutional: She is oriented to person, place, and time. She appears well-developed and well-nourished. No distress.  Afebrile No acute distress, but frequent paroxysms of coughing  HENT:  Head: Normocephalic.  Right Ear: External ear normal.  Left Ear: External ear normal.  Mouth/Throat: Oropharynx is clear and moist.  Eyes: Conjunctivae and EOM are normal. Pupils are equal, round, and reactive to light.  Neck: Normal range of motion. Neck supple. No thyromegaly present.  Cardiovascular: Normal rate, regular rhythm, normal heart sounds and intact distal pulses.   Pulmonary/Chest: Effort normal and breath sounds normal. No respiratory distress. She has no wheezes. She has no rales.  Abdominal: Soft. Bowel sounds are normal. She exhibits no mass. There is no tenderness.  Musculoskeletal: Normal range of motion.  Lymphadenopathy:    She has no cervical adenopathy.  Neurological: She is alert and oriented to person, place, and time.  Skin: Skin is warm and dry. No rash noted.  Psychiatric: She has a normal mood and affect. Her behavior is normal.          Assessment & Plan:   There are URI with cough.  Will treat symptomatically Hypertension, stable

## 2014-08-12 NOTE — Progress Notes (Signed)
Pre visit review using our clinic review tool, if applicable. No additional management support is needed unless otherwise documented below in the visit note. 

## 2014-08-12 NOTE — Patient Instructions (Signed)
Acute bronchitis symptoms for less than 10 days are generally not helped by antibiotics.  Take over-the-counter expectorants and cough medications such as  Mucinex DM.  Call if there is no improvement in 5 to 7 days or if  you develop worsening cough, fever, or new symptoms, such as shortness of breath or chest pain.   

## 2014-08-14 ENCOUNTER — Telehealth: Payer: Self-pay | Admitting: Internal Medicine

## 2014-08-14 MED ORDER — BENZONATATE 100 MG PO CAPS: 100.0000 mg | ORAL_CAPSULE | Freq: Three times a day (TID) | ORAL | Status: DC | PRN

## 2014-08-14 NOTE — Telephone Encounter (Signed)
Spoke to pt's husband Jori Moll, he said pt is in bed.Ttold him new Rx sent to pharmacy for Tessalon capsules to help with cough. Jori Moll verbalized understanding.

## 2014-08-14 NOTE — Telephone Encounter (Signed)
Pt was seen on 10/27 and still has cough. Pt stated cough med is not working and she had fever. [facility] [address] Please advise

## 2014-08-14 NOTE — Telephone Encounter (Signed)
Please advise 

## 2014-08-14 NOTE — Telephone Encounter (Signed)
Add generic Tessalon 100 mg #30 one 3 times a day

## 2014-08-19 ENCOUNTER — Other Ambulatory Visit: Payer: Medicare Other

## 2014-08-26 ENCOUNTER — Other Ambulatory Visit: Payer: Medicare Other

## 2014-08-27 ENCOUNTER — Other Ambulatory Visit (INDEPENDENT_AMBULATORY_CARE_PROVIDER_SITE_OTHER): Payer: Medicare Other | Admitting: *Deleted

## 2014-08-27 DIAGNOSIS — I252 Old myocardial infarction: Secondary | ICD-10-CM

## 2014-08-27 DIAGNOSIS — E7800 Pure hypercholesterolemia, unspecified: Secondary | ICD-10-CM

## 2014-08-27 DIAGNOSIS — I10 Essential (primary) hypertension: Secondary | ICD-10-CM | POA: Diagnosis not present

## 2014-08-27 LAB — LIPID PANEL
Cholesterol: 197 mg/dL (ref 0–200)
HDL: 30.2 mg/dL — ABNORMAL LOW (ref >39.00)
NonHDL: 166.8
Total CHOL/HDL Ratio: 7
Triglycerides: 259 mg/dL — ABNORMAL HIGH (ref 0.0–149.0)
VLDL: 51.8 mg/dL — ABNORMAL HIGH (ref 0.0–40.0)

## 2014-08-27 LAB — LDL CHOLESTEROL, DIRECT: Direct LDL: 131.9 mg/dL

## 2014-09-05 ENCOUNTER — Encounter: Payer: Self-pay | Admitting: Internal Medicine

## 2014-09-05 ENCOUNTER — Ambulatory Visit (INDEPENDENT_AMBULATORY_CARE_PROVIDER_SITE_OTHER): Payer: Medicare Other | Admitting: Internal Medicine

## 2014-09-05 VITALS — BP 122/84 | HR 92 | Temp 98.5°F | Ht 65.0 in | Wt 180.0 lb

## 2014-09-05 DIAGNOSIS — I2510 Atherosclerotic heart disease of native coronary artery without angina pectoris: Secondary | ICD-10-CM

## 2014-09-05 DIAGNOSIS — E7800 Pure hypercholesterolemia, unspecified: Secondary | ICD-10-CM

## 2014-09-05 DIAGNOSIS — I10 Essential (primary) hypertension: Secondary | ICD-10-CM

## 2014-09-05 DIAGNOSIS — I2583 Coronary atherosclerosis due to lipid rich plaque: Secondary | ICD-10-CM

## 2014-09-05 DIAGNOSIS — Z720 Tobacco use: Secondary | ICD-10-CM

## 2014-09-05 DIAGNOSIS — Z23 Encounter for immunization: Secondary | ICD-10-CM | POA: Diagnosis not present

## 2014-09-05 MED ORDER — LISINOPRIL-HYDROCHLOROTHIAZIDE 20-25 MG PO TABS: 1.0000 | ORAL_TABLET | Freq: Every day | ORAL | Status: DC

## 2014-09-05 MED ORDER — METOPROLOL TARTRATE 100 MG PO TABS: ORAL_TABLET | ORAL | Status: DC

## 2014-09-05 MED ORDER — NITROGLYCERIN 0.4 MG SL SUBL: 0.4000 mg | SUBLINGUAL_TABLET | SUBLINGUAL | Status: DC | PRN

## 2014-09-05 MED ORDER — ALPRAZOLAM 0.25 MG PO TABS: ORAL_TABLET | ORAL | Status: DC

## 2014-09-05 NOTE — Progress Notes (Signed)
Subjective:    Patient ID: Tracy [name], female    DOB: 1946/10/06, 68 y.o.   MRN: [id number]  HPI 68 year old patient who is seen today in follow-up.  She has a history of treated hypertension and coronary artery disease.  She was seen last month for a viral URI with cough.  It has improved.  She still has some mild residual chest congestion.  Otherwise doing well.  She request a refill on a prostaglandin.  No fever.  Cardiac status has been stable  Past Medical History  Diagnosis Date  . Headache(784.0)   . Hypertension   . Myocardial infarct     hx of  . Hyperlipidemia   . Menopausal syndrome   . Allergic rhinitis   . Low back pain   . Overactive bladder     History   Social History  . Marital Status: Married    Spouse Name: N/A    Number of Children: N/A  . 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 Smoking status: Former Smoker    Quit date: 07/01/2012  . Smokeless tobacco: Never Used     Comment: electronic cigarettes  . Alcohol Use: No  . Drug Use: No  . Sexual Activity: Not on file   Other Topics Concern  . Not on file   Social History Narrative    Past Surgical History  Procedure Laterality Date  . Tonsillectomy    . Angioplasty      stent 2007    Family History  Problem Relation Age of Onset  . Adopted: Yes  . Other      patient is adopted    Allergies  Allergen Reactions  . Erythromycin   . Sulfamethoxazole     REACTION: unspecified    Current Outpatient Prescriptions on File Prior to Visit  Medication Sig Dispense Refill  . ALPRAZolam (XANAX) 0.25 MG tablet TAKE 1 TABLET BY MOUTH TWICE A DAY AS NEEDED FOR ANXIETY 60 tablet 2  . aspirin 81 MG tablet Take 81 mg by mouth daily.      . cholecalciferol (VITAMIN D) 1000 UNITS tablet Take 1,000 Units by mouth daily.      . clopidogrel (PLAVIX) 75 MG tablet Take 75 mg by mouth daily.     . cyanocobalamin 2000  MCG tablet Take 2,500 mcg by mouth daily.     . hydrochlorothiazide (HYDRODIURIL) 25 MG tablet Take 1 tablet (25 mg total) by mouth daily. 90 tablet 3  . HYDROcodone-acetaminophen (NORCO/VICODIN) 5-325 MG per tablet TAKE 1 TABLET EVERY 6 HOURS AS NEEDED FOR PAIN 60 tablet 0  . lisinopril (PRINIVIL,ZESTRIL) 20 MG tablet Take 1 tablet (20 mg total) by mouth daily. 90 tablet 3  . metoprolol (LOPRESSOR) 100 MG tablet TAKE 1 TABLET (100 MG TOTAL) BY MOUTH 2 (TWO) TIMES DAILY. (NEED APPOINTMENT FOR FURTHER REFILLS) (Patient taking differently: TAKE 1 TABLET (100 MG TOTAL) BY MOUTH 2 (TWO) TIMES DAILY.) 180 tablet 1  . Multiple Vitamin (MULTIVITAMIN) tablet Take 1 tablet by mouth daily.      . Omega-3 Fatty Acids (FISH OIL) 1000 MG CAPS Take by mouth.      . pantoprazole (PROTONIX) 40 MG tablet Take 1 tablet (40 mg total) by mouth daily. 90 tablet 3  . simvastatin (ZOCOR) 40 MG tablet Take 1 tablet (40 mg total) by mouth at bedtime. 90 tablet 3   No current facility-administered  medications on file prior to visit.    BP 122/84 mmHg  Pulse 92  Temp(Src) 98.5 F (36.9 C) (Oral)  Ht 5\' 5"  (1.651 m)  Wt 180 lb (81.647 kg)  BMI 29.95 kg/m2      Review of Systems  Constitutional: Negative.   HENT: Negative for congestion, dental problem, hearing loss, rhinorrhea, sinus pressure, sore throat and tinnitus.   Eyes: Negative for pain, discharge and visual disturbance.  Respiratory: Positive for cough. Negative for shortness of breath.   Cardiovascular: Negative for chest pain, palpitations and leg swelling.  Gastrointestinal: Negative for nausea, vomiting, abdominal pain, diarrhea, constipation, blood in stool and abdominal distention.  Genitourinary: Negative for dysuria, urgency, frequency, hematuria, flank pain, vaginal bleeding, vaginal discharge, difficulty urinating, vaginal pain and pelvic pain.  Musculoskeletal: Negative for joint swelling, arthralgias and gait problem.  Skin: Negative for  rash.  Neurological: Negative for dizziness, syncope, speech difficulty, weakness, numbness and headaches.  Hematological: Negative for adenopathy.  Psychiatric/Behavioral: Negative for behavioral problems, dysphoric mood and agitation. The patient is not nervous/anxious.        Objective:   Physical Exam  Constitutional: She is oriented to person, place, and time. She appears well-developed and well-nourished.  HENT:  Head: Normocephalic.  Right Ear: External ear normal.  Left Ear: External ear normal.  Mouth/Throat: Oropharynx is clear and moist.  Eyes: Conjunctivae and EOM are normal. Pupils are equal, round, and reactive to light.  Neck: Normal range of motion. Neck supple. No thyromegaly present.  Cardiovascular: Normal rate, regular rhythm, normal heart sounds and intact distal pulses.   Pulmonary/Chest: Effort normal. She has rales.  Few crackles right base  Abdominal: Soft. Bowel sounds are normal. She exhibits no mass. There is no tenderness.  Musculoskeletal: Normal range of motion.  Lymphadenopathy:    She has no cervical adenopathy.  Neurological: She is alert and oriented to person, place, and time.  Skin: Skin is warm and dry. No rash noted.  Psychiatric: She has a normal mood and affect. Her behavior is normal.          Assessment & Plan:   Status post viral URI Hypertension, well-controlled Coronary artery disease, stable  Alprazolam refilled CPX 6 months

## 2014-09-05 NOTE — Progress Notes (Signed)
Pre visit review using our clinic review tool, if applicable. No additional management support is needed unless otherwise documented below in the visit note. 

## 2014-09-05 NOTE — Patient Instructions (Signed)
Limit your sodium (Salt) intake    It is important that you exercise regularly, at least 20 minutes 3 to 4 times per week.  If you develop chest pain or shortness of breath seek  medical attention.  Please check your blood pressure on a regular basis.  If it is consistently greater than 150/90, please make an office appointment.  Return in 6 months for follow-up   

## 2014-09-26 ENCOUNTER — Encounter: Payer: Self-pay | Admitting: Cardiovascular Disease

## 2014-09-26 ENCOUNTER — Other Ambulatory Visit: Payer: Self-pay

## 2014-09-26 DIAGNOSIS — E785 Hyperlipidemia, unspecified: Secondary | ICD-10-CM

## 2014-09-26 MED ORDER — ATORVASTATIN CALCIUM 40 MG PO TABS: 40.0000 mg | ORAL_TABLET | Freq: Every day | ORAL | Status: DC

## 2014-09-26 NOTE — Telephone Encounter (Signed)
This encounter was created in error - please disregard.

## 2014-09-26 NOTE — Telephone Encounter (Signed)
Follow up    Pt called for results// please call

## 2014-12-16 ENCOUNTER — Other Ambulatory Visit: Payer: Medicare Other

## 2014-12-18 ENCOUNTER — Encounter: Payer: Self-pay | Admitting: Internal Medicine

## 2014-12-18 ENCOUNTER — Ambulatory Visit (INDEPENDENT_AMBULATORY_CARE_PROVIDER_SITE_OTHER): Payer: Medicare Other | Admitting: Internal Medicine

## 2014-12-18 VITALS — BP 130/80 | HR 78 | Temp 97.6°F | Resp 20 | Ht 65.0 in | Wt 183.0 lb

## 2014-12-18 DIAGNOSIS — I2510 Atherosclerotic heart disease of native coronary artery without angina pectoris: Secondary | ICD-10-CM

## 2014-12-18 DIAGNOSIS — I10 Essential (primary) hypertension: Secondary | ICD-10-CM | POA: Diagnosis not present

## 2014-12-18 DIAGNOSIS — J069 Acute upper respiratory infection, unspecified: Secondary | ICD-10-CM

## 2014-12-18 DIAGNOSIS — B9789 Other viral agents as the cause of diseases classified elsewhere: Secondary | ICD-10-CM

## 2014-12-18 MED ORDER — HYDROCODONE-HOMATROPINE 5-1.5 MG/5ML PO SYRP: 5.0000 mL | ORAL_SOLUTION | Freq: Four times a day (QID) | ORAL | Status: DC | PRN

## 2014-12-18 MED ORDER — HYDROCODONE-ACETAMINOPHEN 5-325 MG PO TABS: ORAL_TABLET | ORAL | Status: DC

## 2014-12-18 NOTE — Progress Notes (Signed)
Pre visit review using our clinic review tool, if applicable. No additional management support is needed unless otherwise documented below in the visit note. 

## 2014-12-18 NOTE — Patient Instructions (Signed)
Acute bronchitis symptoms   are generally not helped by antibiotics.  Take over-the-counter expectorants and cough medications such as  Mucinex DM.  Call if there is no improvement in 5 to 7 days or if  you develop worsening cough, fever, or new symptoms, such as shortness of breath or chest pain.   Acute bronchitis usually goes away in a couple weeks. Oftentimes, no medical treatment is necessary. Medicines are sometimes given for relief of fever or cough. Antibiotic medicines are usually not needed but may be prescribed in certain situations. In some cases, an inhaler may be recommended to help reduce shortness of breath and control the cough. A cool mist vaporizer may also be used to help thin bronchial secretions and make it easier to clear the chest.  HOME CARE INSTRUCTIONS  Get plenty of rest.  Drink enough fluids to keep your urine clear or pale yellow (unless you have a medical condition that requires fluid restriction). Increasing fluids may help thin your respiratory secretions (sputum) and reduce chest congestion, and it will prevent dehydration.  Take medicines only as directed by your health care provider.   Avoid smoking and secondhand smoke. Exposure to cigarette smoke or irritating chemicals will make bronchitis worse. If you are a smoker, consider using nicotine gum or skin patches to help control withdrawal symptoms. Quitting smoking will help your lungs heal faster.  Reduce the chances of another bout of acute bronchitis by washing your hands frequently, avoiding people with cold symptoms, and trying not to touch your hands to your mouth, nose, or eyes.  Keep all follow-up visits as directed by your health care provider.

## 2014-12-18 NOTE — Progress Notes (Signed)
Subjective:    Patient ID: Tracy [name], female    DOB: Dec 19, 1945, 69 y.o.   MRN: [id number]  HPI 69 year old patient who has a history of essential hypertension and history of tobacco use.  She presents with a one-week history of chest congestion and cough.  Earlier she had sore throat and hoarseness, which has resolved.  She has been caring for a grandchild who has not been able to return to daycare due to acute illness.  Cough is largely nonproductive.  No shortness of breath or wheezing or chest pain  Past Medical History  Diagnosis Date  . Headache(784.0)   . Hypertension   . Myocardial infarct     hx of  . Hyperlipidemia   . Menopausal syndrome   . Allergic rhinitis   . Low back pain   . Overactive bladder     History   Social History  . Marital Status: Married    Spouse Name: N/A  . Number of Children: N/A  . 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 Smoking status: Former Smoker    Quit date: 07/01/2012  . Smokeless tobacco: Never Used     Comment: electronic cigarettes  . Alcohol Use: No  . Drug Use: No  . Sexual Activity: Not on file   Other Topics Concern  . Not on file   Social History Narrative    Past Surgical History  Procedure Laterality Date  . Tonsillectomy    . Angioplasty      stent 2007    Family History  Problem Relation Age of Onset  . Adopted: Yes  . Other      patient is adopted    Allergies  Allergen Reactions  . Erythromycin   . Sulfamethoxazole     REACTION: unspecified    Current Outpatient Prescriptions on File Prior to Visit  Medication Sig Dispense Refill  . ALPRAZolam (XANAX) 0.25 MG tablet TAKE 1 TABLET BY MOUTH TWICE A DAY AS NEEDED FOR ANXIETY 60 tablet 2  . aspirin 81 MG tablet Take 81 mg by mouth daily.      Marland Kitchen atorvastatin (LIPITOR) 40 MG tablet Take 1 tablet (40 mg total) by mouth daily. 90 tablet 3  . cholecalciferol  (VITAMIN D) 1000 UNITS tablet Take 1,000 Units by mouth daily.      . clopidogrel (PLAVIX) 75 MG tablet Take 75 mg by mouth daily.     . cyanocobalamin 2000 MCG tablet Take 2,500 mcg by mouth daily.     Marland Kitchen lisinopril-hydrochlorothiazide (PRINZIDE,ZESTORETIC) 20-25 MG per tablet Take 1 tablet by mouth daily. 90 tablet 3  . metoprolol (LOPRESSOR) 100 MG tablet TAKE 1 TABLET (100 MG TOTAL) BY MOUTH 2 (TWO) TIMES DAILY. 180 tablet 1  . Multiple Vitamin (MULTIVITAMIN) tablet Take 1 tablet by mouth daily.      . nitroGLYCERIN (NITROSTAT) 0.4 MG SL tablet Place 1 tablet (0.4 mg total) under the tongue every 5 (five) minutes as needed. 15 tablet 6  . Omega-3 Fatty Acids (FISH OIL) 1000 MG CAPS Take by mouth.      . pantoprazole (PROTONIX) 40 MG tablet Take 1 tablet (40 mg total) by mouth daily. 90 tablet 3   No current facility-administered medications on file prior to visit.    BP 130/80 mmHg  Pulse 78  Temp(Src) 97.6 F (36.4 C) (Oral)  Resp 20  Ht 5\' 5"  (1.651 m)  Wt 183 lb (83.008 kg)  BMI 30.45 kg/m2  SpO2 95%      Review of Systems  Constitutional: Positive for activity change and appetite change.  HENT: Positive for sinus pressure and voice change. Negative for congestion, dental problem, hearing loss, rhinorrhea, sore throat and tinnitus.   Eyes: Negative for pain, discharge and visual disturbance.  Respiratory: Positive for cough. Negative for shortness of breath.   Cardiovascular: Negative for chest pain, palpitations and leg swelling.  Gastrointestinal: Negative for nausea, vomiting, abdominal pain, diarrhea, constipation, blood in stool and abdominal distention.  Genitourinary: Negative for dysuria, urgency, frequency, hematuria, flank pain, vaginal bleeding, vaginal discharge, difficulty urinating, vaginal pain and pelvic pain.  Musculoskeletal: Negative for joint swelling, arthralgias and gait problem.  Skin: Negative for rash.  Neurological: Negative for dizziness, syncope,  speech difficulty, weakness, numbness and headaches.  Hematological: Negative for adenopathy.  Psychiatric/Behavioral: Negative for behavioral problems, dysphoric mood and agitation. The patient is not nervous/anxious.        Objective:   Physical Exam  Constitutional: She is oriented to person, place, and time. She appears well-developed and well-nourished.  Frequent paroxysms of coughing  Afebrile  HENT:  Head: Normocephalic.  Right Ear: External ear normal.  Left Ear: External ear normal.  Mouth/Throat: Oropharynx is clear and moist.  Eyes: Conjunctivae and EOM are normal. Pupils are equal, round, and reactive to light.  Neck: Normal range of motion. Neck supple. No thyromegaly present.  Cardiovascular: Normal rate, regular rhythm, normal heart sounds and intact distal pulses.   Pulmonary/Chest: Effort normal and breath sounds normal. No respiratory distress. She has no wheezes. She has no rales.  O2 saturation 95  Abdominal: Soft. Bowel sounds are normal. She exhibits no mass. There is no tenderness.  Musculoskeletal: Normal range of motion.  Lymphadenopathy:    She has no cervical adenopathy.  Neurological: She is alert and oriented to person, place, and time.  Skin: Skin is warm and dry. No rash noted.  Psychiatric: She has a normal mood and affect. Her behavior is normal.          Assessment & Plan:   Viral URI with cough  Will treat symptomatically  Patient instructions discussed and dispensed

## 2015-01-05 ENCOUNTER — Other Ambulatory Visit (INDEPENDENT_AMBULATORY_CARE_PROVIDER_SITE_OTHER): Payer: Medicare Other | Admitting: *Deleted

## 2015-01-05 DIAGNOSIS — E785 Hyperlipidemia, unspecified: Secondary | ICD-10-CM | POA: Diagnosis not present

## 2015-01-05 LAB — HEPATIC FUNCTION PANEL
ALT: 45 U/L — ABNORMAL HIGH (ref 0–35)
AST: 44 U/L — ABNORMAL HIGH (ref 0–37)
Albumin: 3.6 g/dL (ref 3.5–5.2)
Alkaline Phosphatase: 81 U/L (ref 39–117)
Bilirubin, Direct: 0 mg/dL (ref 0.0–0.3)
Total Bilirubin: 0.4 mg/dL (ref 0.2–1.2)
Total Protein: 7.7 g/dL (ref 6.0–8.3)

## 2015-01-05 LAB — LIPID PANEL
Cholesterol: 180 mg/dL (ref 0–200)
HDL: 32.2 mg/dL — ABNORMAL LOW (ref >39.00)
NonHDL: 147.8
Total CHOL/HDL Ratio: 6
Triglycerides: 294 mg/dL — ABNORMAL HIGH (ref 0.0–149.0)
VLDL: 58.8 mg/dL — ABNORMAL HIGH (ref 0.0–40.0)

## 2015-01-05 LAB — LDL CHOLESTEROL, DIRECT: Direct LDL: 111 mg/dL

## 2015-02-10 ENCOUNTER — Telehealth: Payer: Self-pay | Admitting: Cardiovascular Disease

## 2015-02-10 NOTE — Telephone Encounter (Signed)
Notes Recorded by Sherren Mocha, MD on 01/06/2015 at 12:36 AM Labs reviewed. Would change from simvastatin to atorvastatin 40 mg and continue to work on lifestyle modification. Repeat labs 3 months after med change. thx  I spoke with the pt and made her aware of lab results. The pt states she is already taking Atorvastatin 40mg  daily.  The pt was switched from Simvastatin to Atorvastatin in December 2015.  The pt also complained of pain in her legs and she feels like this is coming from her cholesterol medication.  I will make Dr Burt Knack aware of this information.

## 2015-02-10 NOTE — Telephone Encounter (Signed)
New message      Returning Lauren's call

## 2015-02-11 NOTE — Telephone Encounter (Signed)
Notes Recorded by Barkley Boards, RN on 02/11/2015 at 10:13 AM Per pt cost is an issue at this time. The pt will continue Atorvastatin and try not to miss doses. I have scheduled the pt to see Dr Burt Knack 05/08/15 and we will repeat labs at that time. Notes Recorded by Sherren Mocha, MD on 02/10/2015 at 4:47 PM If not cost-prohibitive, could change to crestor 40 mg to see if better-tolerated and would add additional LDL-lowering. If this is going generic in next few months, would be reasonable to wait until this occurs. Regardless of whether she changes will need repeat LFT's in 3 months as transaminases were mildly elevated.

## 2015-03-09 ENCOUNTER — Ambulatory Visit (INDEPENDENT_AMBULATORY_CARE_PROVIDER_SITE_OTHER): Payer: Medicare Other | Admitting: Internal Medicine

## 2015-03-09 ENCOUNTER — Encounter: Payer: Self-pay | Admitting: Internal Medicine

## 2015-03-09 VITALS — BP 110/72 | HR 70 | Temp 97.0°F | Resp 20 | Ht 65.0 in | Wt 182.0 lb

## 2015-03-09 DIAGNOSIS — I10 Essential (primary) hypertension: Secondary | ICD-10-CM | POA: Diagnosis not present

## 2015-03-09 DIAGNOSIS — J309 Allergic rhinitis, unspecified: Secondary | ICD-10-CM | POA: Diagnosis not present

## 2015-03-09 DIAGNOSIS — J069 Acute upper respiratory infection, unspecified: Secondary | ICD-10-CM | POA: Diagnosis not present

## 2015-03-09 DIAGNOSIS — B9789 Other viral agents as the cause of diseases classified elsewhere: Secondary | ICD-10-CM

## 2015-03-09 DIAGNOSIS — I2510 Atherosclerotic heart disease of native coronary artery without angina pectoris: Secondary | ICD-10-CM | POA: Diagnosis not present

## 2015-03-09 MED ORDER — ROSUVASTATIN CALCIUM 20 MG PO TABS: 20.0000 mg | ORAL_TABLET | Freq: Every day | ORAL | Status: DC

## 2015-03-09 MED ORDER — HYDROCODONE-HOMATROPINE 5-1.5 MG/5ML PO SYRP: 5.0000 mL | ORAL_SOLUTION | Freq: Four times a day (QID) | ORAL | Status: DC | PRN

## 2015-03-09 MED ORDER — HYDROCODONE-ACETAMINOPHEN 5-325 MG PO TABS: ORAL_TABLET | ORAL | Status: DC

## 2015-03-09 NOTE — Progress Notes (Signed)
Subjective:    Patient ID: Tracy [name], female    DOB: 1946/02/28, 69 y.o.   MRN: [id number]  HPI  69 year old patient who presents with a one-week history of sore throat, cough, congestion.  She does have ran children that she is in contact with who have been ill.  She has some mild sore throat earlier in the illness, but this has resolved.  There has been some intermittent low-grade fever.  No wheezing, high fever, chills or purulent sputum production.  She does have history of tobacco use  Past Medical History  Diagnosis Date  . Headache(784.0)   . Hypertension   . Myocardial infarct     hx of  . Hyperlipidemia   . Menopausal syndrome   . Allergic rhinitis   . Low back pain   . Overactive bladder     History   Social History  . Marital Status: Married    Spouse Name: N/A  . Number of Children: N/A  . 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 Smoking status: Former Smoker    Quit date: 07/01/2012  . Smokeless tobacco: Never Used     Comment: electronic cigarettes  . Alcohol Use: No  . Drug Use: No  . Sexual Activity: Not on file   Other Topics Concern  . Not on file   Social History Narrative    Past Surgical History  Procedure Laterality Date  . Tonsillectomy    . Angioplasty      stent 2007    Family History  Problem Relation Age of Onset  . Adopted: Yes  . Other      patient is adopted    Allergies  Allergen Reactions  . Erythromycin   . Sulfamethoxazole     REACTION: unspecified    Current Outpatient Prescriptions on File Prior to Visit  Medication Sig Dispense Refill  . ALPRAZolam (XANAX) 0.25 MG tablet TAKE 1 TABLET BY MOUTH TWICE A DAY AS NEEDED FOR ANXIETY 60 tablet 2  . aspirin 81 MG tablet Take 81 mg by mouth daily.      Marland Kitchen atorvastatin (LIPITOR) 40 MG tablet Take 1 tablet (40 mg total) by mouth daily. 90 tablet 3  . cholecalciferol (VITAMIN D) 1000  UNITS tablet Take 1,000 Units by mouth daily.      . clopidogrel (PLAVIX) 75 MG tablet Take 75 mg by mouth daily.     . cyanocobalamin 2000 MCG tablet Take 2,500 mcg by mouth daily.     Marland Kitchen lisinopril-hydrochlorothiazide (PRINZIDE,ZESTORETIC) 20-25 MG per tablet Take 1 tablet by mouth daily. 90 tablet 3  . metoprolol (LOPRESSOR) 100 MG tablet TAKE 1 TABLET (100 MG TOTAL) BY MOUTH 2 (TWO) TIMES DAILY. 180 tablet 1  . Multiple Vitamin (MULTIVITAMIN) tablet Take 1 tablet by mouth daily.      . nitroGLYCERIN (NITROSTAT) 0.4 MG SL tablet Place 1 tablet (0.4 mg total) under the tongue every 5 (five) minutes as needed. 15 tablet 6  . Omega-3 Fatty Acids (FISH OIL) 1000 MG CAPS Take by mouth.      . pantoprazole (PROTONIX) 40 MG tablet Take 1 tablet (40 mg total) by mouth daily. 90 tablet 3  . zinc gluconate 50 MG tablet Take 50 mg by mouth daily.     No current facility-administered medications on file prior to visit.    BP 110/72 mmHg  Pulse 70  Temp(Src) 97  F (36.1 C) (Oral)  Resp 20  Ht 5\' 5"  (1.651 m)  Wt 182 lb (82.555 kg)  BMI 30.29 kg/m2  SpO2 97%     Review of Systems  Constitutional: Positive for activity change and appetite change.  HENT: Positive for congestion, rhinorrhea, sinus pressure and sore throat. Negative for dental problem, hearing loss and tinnitus.   Eyes: Negative for pain, discharge and visual disturbance.  Respiratory: Positive for cough. Negative for shortness of breath.   Cardiovascular: Negative for chest pain, palpitations and leg swelling.  Gastrointestinal: Negative for nausea, vomiting, abdominal pain, diarrhea, constipation, blood in stool and abdominal distention.  Genitourinary: Negative for dysuria, urgency, frequency, hematuria, flank pain, vaginal bleeding, vaginal discharge, difficulty urinating, vaginal pain and pelvic pain.  Musculoskeletal: Negative for joint swelling, arthralgias and gait problem.  Skin: Negative for rash.  Neurological:  Negative for dizziness, syncope, speech difficulty, weakness, numbness and headaches.  Hematological: Negative for adenopathy.  Psychiatric/Behavioral: Negative for behavioral problems, dysphoric mood and agitation. The patient is not nervous/anxious.        Objective:   Physical Exam  Constitutional: She is oriented to person, place, and time. She appears well-developed and well-nourished.  HENT:  Head: Normocephalic.  Right Ear: External ear normal.  Left Ear: External ear normal.  Mouth/Throat: Oropharynx is clear and moist.  Eyes: Conjunctivae and EOM are normal. Pupils are equal, round, and reactive to light.  Neck: Normal range of motion. Neck supple. No thyromegaly present.  Cardiovascular: Normal rate, regular rhythm, normal heart sounds and intact distal pulses.   Pulmonary/Chest: Effort normal and breath sounds normal. No respiratory distress. She has no wheezes. She has no rales.  Abdominal: Soft. Bowel sounds are normal. She exhibits no mass. There is no tenderness.  Musculoskeletal: Normal range of motion.  Lymphadenopathy:    She has no cervical adenopathy.  Neurological: She is alert and oriented to person, place, and time.  Skin: Skin is warm and dry. No rash noted.  Psychiatric: She has a normal mood and affect. Her behavior is normal.          Assessment & Plan:   Viral URI with cough.  Will treat symptomatically Essential hypertension, stable  Will report any new or worsening symptoms Otherwise, recheck 6 months

## 2015-03-09 NOTE — Patient Instructions (Signed)
Acute bronchitis symptoms for less than 10 days are generally not helped by antibiotics.  Take over-the-counter expectorants and cough medications such as  Mucinex DM.  Call if there is no improvement in 5 to 7 days or if  you develop worsening cough, fever, or new symptoms, such as shortness of breath or chest pain.  DIAGNOSIS  Acute bronchitis is usually diagnosed through a physical exam. Your health care provider will also ask you questions about your medical history. Tests, such as chest X-rays, are sometimes done to rule out other conditions.  TREATMENT  Acute bronchitis usually goes away in a couple weeks. Oftentimes, no medical treatment is necessary. Medicines are sometimes given for relief of fever or cough. Antibiotic medicines are usually not needed but may be prescribed in certain situations. In some cases, an inhaler may be recommended to help reduce shortness of breath and control the cough. A cool mist vaporizer may also be used to help thin bronchial secretions and make it easier to clear the chest.  HOME CARE INSTRUCTIONS  Get plenty of rest.  Drink enough fluids to keep your urine clear or pale yellow (unless you have a medical condition that requires fluid restriction). Increasing fluids may help thin your respiratory secretions (sputum) and reduce chest congestion, and it will prevent dehydration.  Take medicines only as directed by your health care provider.  If you were prescribed an antibiotic medicine, finish it all even if you start to feel better.  Avoid smoking and secondhand smoke. Exposure to cigarette smoke or irritating chemicals will make bronchitis worse. If you are a smoker, consider using nicotine gum or skin patches to help control withdrawal symptoms. Quitting smoking will help your lungs heal faster.  Reduce the chances of another bout of acute bronchitis by washing your hands frequently, avoiding people with cold symptoms, and trying not to touch your hands to your  mouth, nose, or eyes.

## 2015-03-09 NOTE — Progress Notes (Signed)
Pre visit review using our clinic review tool, if applicable. No additional management support is needed unless otherwise documented below in the visit note. 

## 2015-03-23 ENCOUNTER — Encounter: Payer: Self-pay | Admitting: Internal Medicine

## 2015-03-23 ENCOUNTER — Other Ambulatory Visit: Payer: Self-pay | Admitting: *Deleted

## 2015-03-23 ENCOUNTER — Ambulatory Visit (INDEPENDENT_AMBULATORY_CARE_PROVIDER_SITE_OTHER)
Admission: RE | Admit: 2015-03-23 | Discharge: 2015-03-23 | Disposition: A | Discharge status: Home / Self Care | Payer: Medicare Other | Source: Ambulatory Visit | Attending: Internal Medicine | Admitting: Internal Medicine

## 2015-03-23 ENCOUNTER — Ambulatory Visit (INDEPENDENT_AMBULATORY_CARE_PROVIDER_SITE_OTHER): Payer: Medicare Other | Admitting: Internal Medicine

## 2015-03-23 VITALS — BP 130/80 | HR 83 | Temp 98.4°F | Resp 20 | Ht 65.0 in | Wt 181.0 lb

## 2015-03-23 DIAGNOSIS — R053 Chronic cough: Secondary | ICD-10-CM

## 2015-03-23 DIAGNOSIS — Z720 Tobacco use: Secondary | ICD-10-CM | POA: Diagnosis not present

## 2015-03-23 DIAGNOSIS — J3089 Other allergic rhinitis: Secondary | ICD-10-CM | POA: Diagnosis not present

## 2015-03-23 DIAGNOSIS — I10 Essential (primary) hypertension: Secondary | ICD-10-CM

## 2015-03-23 DIAGNOSIS — R05 Cough: Secondary | ICD-10-CM

## 2015-03-23 DIAGNOSIS — I2510 Atherosclerotic heart disease of native coronary artery without angina pectoris: Secondary | ICD-10-CM | POA: Diagnosis not present

## 2015-03-23 DIAGNOSIS — R0602 Shortness of breath: Secondary | ICD-10-CM | POA: Diagnosis not present

## 2015-03-23 MED ORDER — AZITHROMYCIN 250 MG PO TABS: ORAL_TABLET | ORAL | Status: DC

## 2015-03-23 MED ORDER — HYDROCODONE-HOMATROPINE 5-1.5 MG/5ML PO SYRP: 5.0000 mL | ORAL_SOLUTION | Freq: Four times a day (QID) | ORAL | Status: DC | PRN

## 2015-03-23 NOTE — Progress Notes (Signed)
Subjective:    Patient ID: Tracy [name], female    DOB: 21-Jan-1946, 69 y.o.   MRN: [id number]  HPI  69 year old patient history tobacco use who presents with persistent cough.  She was seen 2 weeks ago and treated symptomatically.  Cough has been quite refractory and incessant but largely nonproductive.  There is been no fever.  She does describe some sinus and chest congestion.  Past Medical History  Diagnosis Date  . Headache(784.0)   . Hypertension   . Myocardial infarct     hx of  . Hyperlipidemia   . Menopausal syndrome   . Allergic rhinitis   . Low back pain   . Overactive bladder     History   Social History  . Marital Status: Married    Spouse Name: N/A  . Number of Children: N/A  . 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 Smoking status: Former Smoker    Quit date: 07/01/2012  . Smokeless tobacco: Never Used     Comment: electronic cigarettes  . Alcohol Use: No  . Drug Use: No  . Sexual Activity: Not on file   Other Topics Concern  . Not on file   Social History Narrative    Past Surgical History  Procedure Laterality Date  . Tonsillectomy    . Angioplasty      stent 2007    Family History  Problem Relation Age of Onset  . Adopted: Yes  . Other      patient is adopted    Allergies  Allergen Reactions  . Erythromycin   . Sulfamethoxazole     REACTION: unspecified    Current Outpatient Prescriptions on File Prior to Visit  Medication Sig Dispense Refill  . ALPRAZolam (XANAX) 0.25 MG tablet TAKE 1 TABLET BY MOUTH TWICE A DAY AS NEEDED FOR ANXIETY 60 tablet 2  . aspirin 81 MG tablet Take 81 mg by mouth daily.      Marland Kitchen atorvastatin (LIPITOR) 40 MG tablet Take 1 tablet (40 mg total) by mouth daily. 90 tablet 3  . cholecalciferol (VITAMIN D) 1000 UNITS tablet Take 1,000 Units by mouth daily.      . cyanocobalamin 2000 MCG tablet Take 2,500 mcg by mouth daily.      Marland Kitchen HYDROcodone-acetaminophen (NORCO/VICODIN) 5-325 MG per tablet TAKE 1 TABLET EVERY 6 HOURS AS NEEDED FOR PAIN 60 tablet 0  . HYDROcodone-homatropine (HYCODAN) 5-1.5 MG/5ML syrup Take 5 mLs by mouth every 6 (six) hours as needed for cough. 120 mL 0  . lisinopril-hydrochlorothiazide (PRINZIDE,ZESTORETIC) 20-25 MG per tablet Take 1 tablet by mouth daily. 90 tablet 3  . metoprolol (LOPRESSOR) 100 MG tablet TAKE 1 TABLET (100 MG TOTAL) BY MOUTH 2 (TWO) TIMES DAILY. 180 tablet 1  . Multiple Vitamin (MULTIVITAMIN) tablet Take 1 tablet by mouth daily.      . nitroGLYCERIN (NITROSTAT) 0.4 MG SL tablet Place 1 tablet (0.4 mg total) under the tongue every 5 (five) minutes as needed. 15 tablet 6  . Omega-3 Fatty Acids (FISH OIL) 1000 MG CAPS Take by mouth.      . pantoprazole (PROTONIX) 40 MG tablet Take 1 tablet (40 mg total) by mouth daily. 90 tablet 3  . zinc gluconate 50 MG tablet Take 50 mg by mouth daily.     No current facility-administered medications on file prior to visit.    BP 130/80 mmHg  Pulse  83  Temp(Src) 98.4 F (36.9 C) (Oral)  Resp 20  Ht 5\' 5"  (1.651 m)  Wt 181 lb (82.101 kg)  BMI 30.12 kg/m2  SpO2 93%     Review of Systems  Constitutional: Negative.   HENT: Negative for congestion, dental problem, hearing loss, rhinorrhea, sinus pressure, sore throat and tinnitus.   Eyes: Negative for pain, discharge and visual disturbance.  Respiratory: Positive for cough. Negative for shortness of breath.   Cardiovascular: Negative for chest pain, palpitations and leg swelling.  Gastrointestinal: Negative for nausea, vomiting, abdominal pain, diarrhea, constipation, blood in stool and abdominal distention.  Genitourinary: Negative for dysuria, urgency, frequency, hematuria, flank pain, vaginal bleeding, vaginal discharge, difficulty urinating, vaginal pain and pelvic pain.  Musculoskeletal: Negative for joint swelling, arthralgias and gait problem.  Skin: Negative for rash.    Neurological: Negative for dizziness, syncope, speech difficulty, weakness, numbness and headaches.  Hematological: Negative for adenopathy.  Psychiatric/Behavioral: Negative for behavioral problems, dysphoric mood and agitation. The patient is not nervous/anxious.        Objective:   Physical Exam  Constitutional: She is oriented to person, place, and time. She appears well-developed and well-nourished.  HENT:  Head: Normocephalic.  Right Ear: External ear normal.  Left Ear: External ear normal.  Mouth/Throat: Oropharynx is clear and moist.  Eyes: Conjunctivae and EOM are normal. Pupils are equal, round, and reactive to light.  Neck: Normal range of motion. Neck supple. No thyromegaly present.  Cardiovascular: Normal rate, regular rhythm, normal heart sounds and intact distal pulses.   Pulmonary/Chest: Effort normal.  Frequent paroxysms of coughing A few scattered coarse rhonchi  Abdominal: Soft. Bowel sounds are normal. She exhibits no mass. There is no tenderness.  Musculoskeletal: Normal range of motion.  Lymphadenopathy:    She has no cervical adenopathy.  Neurological: She is alert and oriented to person, place, and time.  Skin: Skin is warm and dry. No rash noted.  Psychiatric: She has a normal mood and affect. Her behavior is normal.          Assessment & Plan:   Persistent postviral cough History tobacco use History of allergic rhinitis  We'll check a chest x-ray Continue symptomatic treatment

## 2015-03-23 NOTE — Progress Notes (Signed)
Pre visit review using our clinic review tool, if applicable. No additional management support is needed unless otherwise documented below in the visit note. 

## 2015-03-23 NOTE — Patient Instructions (Signed)
Acute bronchitis symptoms are generally not helped by antibiotics.  Take over-the-counter expectorants and cough medications such as  Mucinex DM.  Call if there is no improvement in 5 to 7 days or if  you develop worsening cough, fever, or new symptoms, such as shortness of breath or chest pain.  HOME CARE INSTRUCTIONS  Get plenty of rest.  Drink enough fluids to keep your urine clear or pale yellow (unless you have a medical condition that requires fluid restriction). Increasing fluids may help thin your respiratory secretions (sputum) and reduce chest congestion, and it will prevent dehydration.  Take medicines only as directed by your health care provider.  If you were prescribed an antibiotic medicine, finish it all even if you start to feel better.  Avoid smoking and secondhand smoke. Exposure to cigarette smoke or irritating chemicals will make bronchitis worse. If you are a smoker, consider using nicotine gum or skin patches to help control withdrawal symptoms. Quitting smoking will help your lungs heal faster.  Reduce the chances of another bout of acute bronchitis by washing your hands frequently, avoiding people with cold symptoms, and trying not to touch your hands to your mouth, nose, or eyes.   

## 2015-04-04 ENCOUNTER — Other Ambulatory Visit: Payer: Self-pay | Admitting: Internal Medicine

## 2015-05-08 ENCOUNTER — Ambulatory Visit: Payer: Medicare Other | Admitting: Cardiovascular Disease

## 2015-06-19 ENCOUNTER — Ambulatory Visit (INDEPENDENT_AMBULATORY_CARE_PROVIDER_SITE_OTHER): Payer: Medicare Other | Admitting: Cardiovascular Disease

## 2015-06-19 ENCOUNTER — Other Ambulatory Visit: Payer: Self-pay

## 2015-06-19 ENCOUNTER — Encounter: Payer: Self-pay | Admitting: Cardiovascular Disease

## 2015-06-19 VITALS — BP 118/76 | HR 79 | Ht 65.0 in | Wt 179.8 lb

## 2015-06-19 DIAGNOSIS — E785 Hyperlipidemia, unspecified: Secondary | ICD-10-CM

## 2015-06-19 DIAGNOSIS — I10 Essential (primary) hypertension: Secondary | ICD-10-CM | POA: Diagnosis not present

## 2015-06-19 DIAGNOSIS — I2510 Atherosclerotic heart disease of native coronary artery without angina pectoris: Secondary | ICD-10-CM

## 2015-06-19 DIAGNOSIS — E7800 Pure hypercholesterolemia, unspecified: Secondary | ICD-10-CM

## 2015-06-19 LAB — COMPREHENSIVE METABOLIC PANEL
ALT: 44 U/L — ABNORMAL HIGH (ref 0–35)
AST: 40 U/L — ABNORMAL HIGH (ref 0–37)
Albumin: 3.9 g/dL (ref 3.5–5.2)
Alkaline Phosphatase: 79 U/L (ref 39–117)
BUN: 50 mg/dL — ABNORMAL HIGH (ref 6–23)
CO2: 26 mEq/L (ref 19–32)
Calcium: 9.4 mg/dL (ref 8.4–10.5)
Chloride: 104 mEq/L (ref 96–112)
Creatinine, Ser: 1.62 mg/dL — ABNORMAL HIGH (ref 0.40–1.20)
GFR: 33.67 mL/min — ABNORMAL LOW (ref >60.00)
Glucose, Bld: 100 mg/dL — ABNORMAL HIGH (ref 70–99)
Potassium: 4 mEq/L (ref 3.5–5.1)
Sodium: 139 mEq/L (ref 135–145)
Total Bilirubin: 0.4 mg/dL (ref 0.2–1.2)
Total Protein: 8.2 g/dL (ref 6.0–8.3)

## 2015-06-19 LAB — LIPID PANEL
Cholesterol: 193 mg/dL (ref 0–200)
HDL: 34 mg/dL — ABNORMAL LOW (ref >39.00)
LDL Cholesterol: 121 mg/dL — ABNORMAL HIGH (ref 0–99)
NonHDL: 159.02
Total CHOL/HDL Ratio: 6
Triglycerides: 189 mg/dL — ABNORMAL HIGH (ref 0.0–149.0)
VLDL: 37.8 mg/dL (ref 0.0–40.0)

## 2015-06-19 MED ORDER — COENZYME Q10 200 MG PO TABS: 200.0000 mg | ORAL_TABLET | Freq: Every day | ORAL | Status: DC

## 2015-06-19 NOTE — Progress Notes (Signed)
Cardiology Office Note Date:  06/19/2015   ID:  Tracy [name], DOB 18-Sep-1946, MRN [id number]  PCP:  Rogelia Boga, MD  Cardiologist:  Tonny Bollman, MD    Chief Complaint  Patient presents with  . Coronary Artery Disease  . Labs Only   History of Present Illness: Tracy [name] is a 69 y.o. female who presents for follow-up of coronary artery disease.  She initially presented in 2007 with a lateral wall MI. She was treated with primary PCI using a bare-metal stent in the left circumflex. Her last heart catheterization was in 2008 and this demonstrated mildly reduced LV systolic function with an ejection fraction estimated at 50%, mild mitral regurgitation, stent patency of the left circumflex, and minor nonobstructive disease elsewhere. Last echo study in 2010 showed normal LV systolic function and mild mitral regurgitation.  She reports myalgias in her legs and attributes this to atorvastatin. Sometimes skips doses. She also has arthritis.   She's been busy - has a new grandchild and has been traveling a lot. Not engaged in exercise. No chest pain or shortness of breath. Does admit to leg swelling. No orthopnea or PND. Leg swelling is in ankles and more pronounced in summer months. She's been working with Dr Kirtland Bouchard on BP control and this is much better. Has added lisinopril/HCT to her regimen.  Past Medical History  Diagnosis Date  . Headache(784.0)   . Hypertension   . Myocardial infarct     hx of  . Hyperlipidemia   . Menopausal syndrome   . Allergic rhinitis   . Low back pain   . Overactive bladder     Past Surgical History  Procedure Laterality Date  . Tonsillectomy    . Angioplasty      stent 2007    Current Outpatient Prescriptions  Medication Sig Dispense Refill  . ALPRAZolam (XANAX) 0.25 MG tablet TAKE 1 TABLET BY MOUTH TWICE A DAY AS NEEDED FOR ANXIETY 60 tablet 2  . aspirin 81 MG tablet Take 81 mg by mouth daily.      Marland Kitchen atorvastatin  (LIPITOR) 40 MG tablet Take 1 tablet (40 mg total) by mouth daily. 90 tablet 3  . cholecalciferol (VITAMIN D) 1000 UNITS tablet Take 1,000 Units by mouth daily.      . cyanocobalamin 2000 MCG tablet Take 2,500 mcg by mouth daily.     Marland Kitchen HYDROcodone-acetaminophen (NORCO/VICODIN) 5-325 MG per tablet TAKE 1 TABLET EVERY 6 HOURS AS NEEDED FOR PAIN 60 tablet 0  . lisinopril-hydrochlorothiazide (PRINZIDE,ZESTORETIC) 20-25 MG per tablet Take 1 tablet by mouth daily. 90 tablet 3  . metoprolol (LOPRESSOR) 100 MG tablet TAKE 1 TABLET BY MOUTH 2 TIMES DAILY. 180 tablet 0  . Multiple Vitamin (MULTIVITAMIN) tablet Take 1 tablet by mouth daily.      . nitroGLYCERIN (NITROSTAT) 0.4 MG SL tablet Place 1 tablet (0.4 mg total) under the tongue every 5 (five) minutes as needed. 15 tablet 6  . Omega-3 Fatty Acids (FISH OIL) 1000 MG CAPS Take by mouth.      . pantoprazole (PROTONIX) 40 MG tablet TAKE 1 TABLET (40 MG TOTAL) BY MOUTH DAILY. 90 tablet 0  . zinc gluconate 50 MG tablet Take 50 mg by mouth daily.    . Coenzyme Q10 200 MG TABS Take 1 tablet (200 mg total) by mouth daily.  0   No current facility-administered medications for this visit.    Allergies:   Erythromycin and Sulfamethoxazole   Social History:  The patient  reports that she quit smoking about 2 years ago. She has never used smokeless tobacco. She reports that she does not drink alcohol or use illicit drugs.   Family History:  The patient's  family history includes Other in an other family member. She was adopted.    ROS:  Please see the history of present illness.  Otherwise, review of systems is positive for leg swelling, abdominal pain, back pain, muscle pain, easy bruising, constipation, nausea, anxiety, headaches.  All other systems are reviewed and negative.    PHYSICAL EXAM: VS:  BP 118/76 mmHg  Pulse 79  Ht $R'5\' 5"'Fe$  (1.651 m)  Wt 179 lb 12.8 oz (81.557 kg)  BMI 29.92 kg/m2 , BMI Body mass index is 29.92 kg/(m^2). GEN: Well nourished,  well developed, in no acute distress HEENT: normal Neck: no JVD, no masses. No carotid bruits Cardiac: RRR without murmur or gallop                Respiratory:  clear to auscultation bilaterally, normal work of breathing GI: soft, nontender, nondistended, + BS MS: no deformity or atrophy Ext: no pretibial edema, pedal pulses 2+= bilaterally Skin: warm and dry, no rash Neuro:  Strength and sensation are intact Psych: euthymic mood, full affect  EKG:  EKG is ordered today. The ekg ordered today shows NSR 79 bpm, within normal limits  Recent Labs: 01/05/2015: ALT 45*   Lipid Panel     Component Value Date/Time   CHOL 180 01/05/2015 1019   TRIG 294.0* 01/05/2015 1019   HDL 32.20* 01/05/2015 1019   CHOLHDL 6 01/05/2015 1019   VLDL 58.8* 01/05/2015 1019   LDLCALC 129* 01/14/2014 0954   LDLDIRECT 111.0 01/05/2015 1019      Wt Readings from Last 3 Encounters:  06/19/15 179 lb 12.8 oz (81.557 kg)  03/23/15 181 lb (82.101 kg)  03/09/15 182 lb (82.555 kg)    ASSESSMENT AND PLAN: 1.  Coronary artery disease, native vessel: The patient is stable without angina. Medications were reviewed and she is on appropriate therapy with aspirin, a statin drug, and ACE inhibitor, and a beta blocker. I encouraged regular exercise, diet, and weight loss efforts.  2. Hyperlipidemia: LDL was above goal at last check. Will repeat labs today. Advised coenzyme Q10 to help with myalgias.  3. Essential hypertension: Blood pressure now well controlled on a combination of metoprolol, lisinopril, and HCTZ. Will check a metabolic panel.  Current medicines are reviewed with the patient today.  The patient does not have concerns regarding medicines.  Labs/ tests ordered today include:   Orders Placed This Encounter  Procedures  . Comp Met (CMET)  . Lipid panel  . EKG 12-Lead   Disposition:   FU one year  Signed, Sherren Mocha, MD  06/19/2015 10:14 AM    [facility] [address], [place], [place]  [address] Phone: [phone]; Fax: [phone]

## 2015-06-19 NOTE — Patient Instructions (Signed)
Medication Instructions:  Your physician has recommended you make the following change in your medication:  1. START CoEnzyme Q10 200mg  daily (over the counter)  Labwork: Your physician recommends that you have lab work today: BMP, LIPID and LIVER  Testing/Procedures: No new orders.   Follow-Up: Your physician wants you to follow-up in: 1 YEAR with Dr Burt Knack.  You will receive a reminder letter in the mail two months in advance. If you don't receive a letter, please call our office to schedule the follow-up appointment.   Any Other Special Instructions Will Be Listed Below (If Applicable).

## 2015-07-20 ENCOUNTER — Ambulatory Visit (INDEPENDENT_AMBULATORY_CARE_PROVIDER_SITE_OTHER): Payer: Medicare Other | Admitting: Internal Medicine

## 2015-07-20 ENCOUNTER — Other Ambulatory Visit: Payer: Medicare Other

## 2015-07-20 ENCOUNTER — Encounter: Payer: Self-pay | Admitting: Internal Medicine

## 2015-07-20 VITALS — BP 110/70 | HR 88 | Temp 98.5°F | Resp 20 | Ht 65.0 in | Wt 181.0 lb

## 2015-07-20 DIAGNOSIS — J069 Acute upper respiratory infection, unspecified: Secondary | ICD-10-CM | POA: Diagnosis not present

## 2015-07-20 DIAGNOSIS — I2510 Atherosclerotic heart disease of native coronary artery without angina pectoris: Secondary | ICD-10-CM

## 2015-07-20 DIAGNOSIS — E7800 Pure hypercholesterolemia, unspecified: Secondary | ICD-10-CM

## 2015-07-20 DIAGNOSIS — I10 Essential (primary) hypertension: Secondary | ICD-10-CM

## 2015-07-20 DIAGNOSIS — B9789 Other viral agents as the cause of diseases classified elsewhere: Secondary | ICD-10-CM

## 2015-07-20 MED ORDER — NITROGLYCERIN 0.4 MG SL SUBL: 0.4000 mg | SUBLINGUAL_TABLET | SUBLINGUAL | Status: DC | PRN

## 2015-07-20 MED ORDER — METOPROLOL TARTRATE 100 MG PO TABS: ORAL_TABLET | ORAL | Status: DC

## 2015-07-20 MED ORDER — ALPRAZOLAM 0.25 MG PO TABS: ORAL_TABLET | ORAL | Status: DC

## 2015-07-20 MED ORDER — PANTOPRAZOLE SODIUM 40 MG PO TBEC: DELAYED_RELEASE_TABLET | ORAL | Status: DC

## 2015-07-20 MED ORDER — HYDROCODONE-ACETAMINOPHEN 5-325 MG PO TABS: ORAL_TABLET | ORAL | Status: DC

## 2015-07-20 NOTE — Progress Notes (Signed)
Subjective:    Patient ID: Tracy [name], female    DOB: January 12, 1946, 69 y.o.   MRN: [id number]  HPI  69 year old patient who has a history of hypertension and dyslipidemia.  She presents with a one-week history of nonproductive cough, intermittent low-grade fever.  She does help care for 2 grandchildren who are frequently ill who also receives care at a day care center.  No wheezing, shortness of breath or sputum production.  She was treated for a kidney car pneumonia in June of this year.  Past Medical History  Diagnosis Date  . Headache(784.0)   . Hypertension   . Myocardial infarct (HCC)     hx of  . Hyperlipidemia   . Menopausal syndrome   . Allergic rhinitis   . Low back pain   . Overactive bladder     Social History   Social History  . Marital Status: Married    Spouse Name: N/A  . Number of Children: N/A  . 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 Smoking status: Former Smoker    Quit date: 07/01/2012  . Smokeless tobacco: Never Used     Comment: electronic cigarettes  . Alcohol Use: No  . Drug Use: No  . Sexual Activity: Not on file   Other Topics Concern  . Not on file   Social History Narrative    Past Surgical History  Procedure Laterality Date  . Tonsillectomy    . Angioplasty      stent 2007    Family History  Problem Relation Age of Onset  . Adopted: Yes  . Other      patient is adopted    Allergies  Allergen Reactions  . Erythromycin   . Sulfamethoxazole     REACTION: unspecified    Current Outpatient Prescriptions on File Prior to Visit  Medication Sig Dispense Refill  . aspirin 81 MG tablet Take 81 mg by mouth daily.      Marland Kitchen atorvastatin (LIPITOR) 40 MG tablet Take 1 tablet (40 mg total) by mouth daily. 90 tablet 3  . cholecalciferol (VITAMIN D) 1000 UNITS tablet Take 1,000 Units by mouth daily.      . cyanocobalamin 2000 MCG tablet Take 2,500 mcg  by mouth daily.     Marland Kitchen lisinopril-hydrochlorothiazide (PRINZIDE,ZESTORETIC) 20-25 MG per tablet Take 1 tablet by mouth daily. 90 tablet 3  . Multiple Vitamin (MULTIVITAMIN) tablet Take 1 tablet by mouth daily.      . Omega-3 Fatty Acids (FISH OIL) 1000 MG CAPS Take by mouth.      . zinc gluconate 50 MG tablet Take 50 mg by mouth daily.     No current facility-administered medications on file prior to visit.    BP 110/70 mmHg  Pulse 88  Temp(Src) 98.5 F (36.9 C) (Oral)  Resp 20  Ht 5\' 5"  (1.651 m)  Wt 181 lb (82.101 kg)  BMI 30.12 kg/m2  SpO2 95%     Review of Systems  Constitutional: Positive for fatigue.  HENT: Negative for congestion, dental problem, hearing loss, rhinorrhea, sinus pressure, sore throat and tinnitus.   Eyes: Negative for pain, discharge and visual disturbance.  Respiratory: Positive for cough. Negative for shortness of breath.   Cardiovascular: Negative for chest pain, palpitations and leg swelling.  Gastrointestinal: Negative for nausea, vomiting, abdominal pain, diarrhea, constipation, blood in stool and abdominal distention.  Genitourinary: Negative for dysuria,  urgency, frequency, hematuria, flank pain, vaginal bleeding, vaginal discharge, difficulty urinating, vaginal pain and pelvic pain.  Musculoskeletal: Negative for joint swelling, arthralgias and gait problem.  Skin: Negative for rash.  Neurological: Negative for dizziness, syncope, speech difficulty, weakness, numbness and headaches.  Hematological: Negative for adenopathy.  Psychiatric/Behavioral: Negative for behavioral problems, dysphoric mood and agitation. The patient is not nervous/anxious.        Objective:   Physical Exam  Constitutional: She is oriented to person, place, and time. She appears well-developed and well-nourished.  HENT:  Head: Normocephalic.  Right Ear: External ear normal.  Left Ear: External ear normal.  Mouth/Throat: Oropharynx is clear and moist.  Eyes:  Conjunctivae and EOM are normal. Pupils are equal, round, and reactive to light.  Neck: Normal range of motion. Neck supple. No thyromegaly present.  Cardiovascular: Normal rate, regular rhythm, normal heart sounds and intact distal pulses.   Pulmonary/Chest: Effort normal and breath sounds normal. No respiratory distress. She has no wheezes. She has no rales.  Abdominal: Soft. Bowel sounds are normal. She exhibits no mass. There is no tenderness.  Musculoskeletal: Normal range of motion.  Lymphadenopathy:    She has no cervical adenopathy.  Neurological: She is alert and oriented to person, place, and time.  Skin: Skin is warm and dry. No rash noted.  Psychiatric: She has a normal mood and affect. Her behavior is normal.          Assessment & Plan:   Viral URI with cough Essential hypertension, stable Dyslipidemia  We'll treat symptomatically.  Vicodin refilled; will use this medication as a cough suppressant along with Mucinex DM

## 2015-07-20 NOTE — Progress Notes (Signed)
Pre visit review using our clinic review tool, if applicable. No additional management support is needed unless otherwise documented below in the visit note. 

## 2015-07-20 NOTE — Patient Instructions (Signed)
Acute bronchitis symptoms for less than 10 days are generally not helped by antibiotics.  Take over-the-counter expectorants and cough medications such as  Mucinex DM.  Call if there is no improvement in 5 to 7 days or if  you develop worsening cough, fever, or new symptoms, such as shortness of breath or chest pain.  HOME CARE INSTRUCTIONS  Get plenty of rest.  Drink enough fluids to keep your urine clear or pale yellow (unless you have a medical condition that requires fluid restriction). Increasing fluids may help thin your respiratory secretions (sputum) and reduce chest congestion, and it will prevent dehydration.  Take medicines only as directed by your health care provider.   Avoid smoking and secondhand smoke. Exposure to cigarette smoke or irritating chemicals will make bronchitis worse. If you are a smoker, consider using nicotine gum or skin patches to help control withdrawal symptoms. Quitting smoking will help your lungs heal faster.

## 2015-07-27 ENCOUNTER — Other Ambulatory Visit (INDEPENDENT_AMBULATORY_CARE_PROVIDER_SITE_OTHER): Payer: Medicare Other | Admitting: *Deleted

## 2015-07-27 DIAGNOSIS — I10 Essential (primary) hypertension: Secondary | ICD-10-CM | POA: Diagnosis not present

## 2015-07-27 DIAGNOSIS — E785 Hyperlipidemia, unspecified: Secondary | ICD-10-CM

## 2015-07-27 LAB — BASIC METABOLIC PANEL
BUN: 26 mg/dL — ABNORMAL HIGH (ref 7–25)
CO2: 23 mmol/L (ref 20–31)
Calcium: 9.2 mg/dL (ref 8.6–10.4)
Chloride: 105 mmol/L (ref 98–110)
Creat: 1.1 mg/dL — ABNORMAL HIGH (ref 0.50–0.99)
Glucose, Bld: 86 mg/dL (ref 65–99)
Potassium: 3.8 mmol/L (ref 3.5–5.3)
Sodium: 139 mmol/L (ref 135–146)

## 2015-07-27 NOTE — Addendum Note (Signed)
Addended by: Eulis Foster on: 07/27/2015 08:13 AM   Modules accepted: Orders

## 2015-07-27 NOTE — Addendum Note (Signed)
Addended by: Eulis Foster on: 07/27/2015 08:12 AM   Modules accepted: Orders

## 2015-07-27 NOTE — Addendum Note (Signed)
Addended by: Eulis Foster on: 07/27/2015 08:11 AM   Modules accepted: Orders

## 2015-07-27 NOTE — Addendum Note (Signed)
Addended by: Eulis Foster on: 07/27/2015 08:10 AM   Modules accepted: Orders

## 2015-07-29 DIAGNOSIS — H52223 Regular astigmatism, bilateral: Secondary | ICD-10-CM | POA: Diagnosis not present

## 2015-07-29 DIAGNOSIS — H5203 Hypermetropia, bilateral: Secondary | ICD-10-CM | POA: Diagnosis not present

## 2015-07-29 DIAGNOSIS — I10 Essential (primary) hypertension: Secondary | ICD-10-CM | POA: Diagnosis not present

## 2015-07-29 DIAGNOSIS — H35033 Hypertensive retinopathy, bilateral: Secondary | ICD-10-CM | POA: Diagnosis not present

## 2015-07-29 DIAGNOSIS — H2513 Age-related nuclear cataract, bilateral: Secondary | ICD-10-CM | POA: Diagnosis not present

## 2015-08-05 ENCOUNTER — Ambulatory Visit (INDEPENDENT_AMBULATORY_CARE_PROVIDER_SITE_OTHER): Payer: Medicare Other | Admitting: Internal Medicine

## 2015-08-05 ENCOUNTER — Other Ambulatory Visit: Payer: Self-pay | Admitting: *Deleted

## 2015-08-05 ENCOUNTER — Encounter: Payer: Self-pay | Admitting: Internal Medicine

## 2015-08-05 VITALS — BP 112/70 | HR 81 | Temp 98.1°F | Resp 20 | Ht 64.5 in | Wt 181.0 lb

## 2015-08-05 DIAGNOSIS — E7800 Pure hypercholesterolemia, unspecified: Secondary | ICD-10-CM | POA: Diagnosis not present

## 2015-08-05 DIAGNOSIS — Z0000 Encounter for general adult medical examination without abnormal findings: Secondary | ICD-10-CM

## 2015-08-05 DIAGNOSIS — J3089 Other allergic rhinitis: Secondary | ICD-10-CM | POA: Diagnosis not present

## 2015-08-05 DIAGNOSIS — Z23 Encounter for immunization: Secondary | ICD-10-CM

## 2015-08-05 DIAGNOSIS — I10 Essential (primary) hypertension: Secondary | ICD-10-CM

## 2015-08-05 DIAGNOSIS — I2510 Atherosclerotic heart disease of native coronary artery without angina pectoris: Secondary | ICD-10-CM | POA: Diagnosis not present

## 2015-08-05 MED ORDER — HYDROCODONE-ACETAMINOPHEN 5-325 MG PO TABS
ORAL_TABLET | ORAL | Status: DC
Start: 2015-08-05 — End: 2015-12-28

## 2015-08-05 NOTE — Progress Notes (Signed)
Subjective:    Patient ID: Tracy [name], female    DOB: 1946-03-28, 69 y.o.   MRN: [id number]  HPI 69 year-old patient who is seen today for a health maintenance examination.   She has coronary artery disease and is status post prior non-ST segment elevated MI in 2007. She is doing quite well. She has treated hypertension and dyslipidemia. No concerns or complaints  Past Medical History  Diagnosis Date  . Headache(784.0)   . Hypertension   . Myocardial infarct (HCC)     hx of  . Hyperlipidemia   . Menopausal syndrome   . Allergic rhinitis   . Low back pain   . Overactive bladder     Social History   Social History  . Marital Status: Married    Spouse Name: N/A  . Number of Children: N/A  . 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 Smoking status: Former Smoker    Quit date: 07/01/2012  . Smokeless tobacco: Never Used     Comment: electronic cigarettes  . Alcohol Use: No  . Drug Use: No  . Sexual Activity: Not on file   Other Topics Concern  . Not on file   Social History Narrative    Past Surgical History  Procedure Laterality Date  . Tonsillectomy    . Angioplasty      stent 2007    Family History  Problem Relation Age of Onset  . Adopted: Yes  . Other      patient is adopted    Allergies  Allergen Reactions  . Erythromycin   . Sulfamethoxazole     REACTION: unspecified    Current Outpatient Prescriptions on File Prior to Visit  Medication Sig Dispense Refill  . ALPRAZolam (XANAX) 0.25 MG tablet TAKE 1 TABLET BY MOUTH TWICE A DAY AS NEEDED FOR ANXIETY 60 tablet 2  . aspirin 81 MG tablet Take 81 mg by mouth daily.      Marland Kitchen atorvastatin (LIPITOR) 40 MG tablet Take 1 tablet (40 mg total) by mouth daily. 90 tablet 3  . cholecalciferol (VITAMIN D) 1000 UNITS tablet Take 1,000 Units by mouth daily.      . cyanocobalamin 2000 MCG tablet Take 2,500 mcg by mouth daily.      Marland Kitchen HYDROcodone-acetaminophen (NORCO/VICODIN) 5-325 MG tablet TAKE 1 TABLET EVERY 6 HOURS AS NEEDED FOR PAIN 60 tablet 0  . lisinopril-hydrochlorothiazide (PRINZIDE,ZESTORETIC) 20-25 MG per tablet Take 1 tablet by mouth daily. 90 tablet 3  . metoprolol (LOPRESSOR) 100 MG tablet TAKE 1 TABLET BY MOUTH 2 TIMES DAILY. 180 tablet 0  . Multiple Vitamin (MULTIVITAMIN) tablet Take 1 tablet by mouth daily.      . nitroGLYCERIN (NITROSTAT) 0.4 MG SL tablet Place 1 tablet (0.4 mg total) under the tongue every 5 (five) minutes as needed. 15 tablet 6  . Omega-3 Fatty Acids (FISH OIL) 1000 MG CAPS Take by mouth.      . pantoprazole (PROTONIX) 40 MG tablet TAKE 1 TABLET (40 MG TOTAL) BY MOUTH DAILY. 90 tablet 0  . zinc gluconate 50 MG tablet Take 50 mg by mouth daily.     No current facility-administered medications on file prior to visit.    BP 112/70 mmHg  Pulse 81  Temp(Src) 98.1 F (36.7 C) (Oral)  Resp 20  Ht 5' 4.5" (1.638 m)  Wt 181 lb (82.101 kg)  BMI 30.60 kg/m2  SpO2  96%   1. Risk factors, based on past  M,S,F history.  Patient has known coronary artery disease and prior MI.  Cardiovascular risk factors include hypertension and dyslipidemia.  She has been off tobacco products for about one year  2.  Physical activities: No exercise restriction.  Active with her grandchildren, but no rigorous exercise regimen.  No exertional chest pain  3.  Depression/mood: No history of major depression or mood disorder  4.  Hearing: No major deficits  5.  ADL's: Independent in all aspects of daily living  6.  Fall risk: Low  7.  Home safety: No problems identified  8.  Height weight, and visual acuity; height and weight stable no change in visual acuity.  Has had a recent eye examination  9.  Counseling: Heart healthy diet more regular exercise.  All encouraged  10. Lab orders based on risk factors: Laboratory studies reviewed  11. Referral : Follow-up cardiology.  Mammogram has been  scheduled  12. Care plan: Continue aggressive risk factor modification  13. Cognitive assessment: Alert in order with normal affect.  No cognitive dysfunction  14. Screening: Patient provided with a written and personalized 5-10 year screening schedule in the AVS.  .  We'll continue annual clinical exams with screening lab.  Will continue cardiology follow-up and annual mammograms.  Will continue colonoscopies at 10 year intervals  15. Provider List Update: Cardiology, primary care medicine ophthalmology and radiology     Review of Systems  Constitutional: Negative for fever, appetite change, fatigue and unexpected weight change.  HENT: Negative for congestion, dental problem, ear pain, hearing loss, mouth sores, nosebleeds, sinus pressure, sore throat, tinnitus, trouble swallowing and voice change.   Eyes: Negative for photophobia, pain, redness and visual disturbance.  Respiratory: Negative for cough, chest tightness and shortness of breath.   Cardiovascular: Negative for chest pain, palpitations and leg swelling.  Gastrointestinal: Negative for nausea, vomiting, abdominal pain, diarrhea, constipation, blood in stool, abdominal distention and rectal pain.  Genitourinary: Negative for dysuria, urgency, frequency, hematuria, flank pain, vaginal bleeding, vaginal discharge, difficulty urinating, genital sores, vaginal pain, menstrual problem and pelvic pain.  Musculoskeletal: Negative for back pain, arthralgias and neck stiffness.  Skin: Negative for rash.  Neurological: Negative for dizziness, syncope, speech difficulty, weakness, light-headedness, numbness and headaches.  Hematological: Negative for adenopathy. Does not bruise/bleed easily.  Psychiatric/Behavioral: Negative for suicidal ideas, behavioral problems, self-injury, dysphoric mood and agitation. The patient is not nervous/anxious.        Objective:   Physical Exam  Constitutional: She is oriented to person, place, and time.  She appears well-developed and well-nourished.  HENT:  Head: Normocephalic and atraumatic.  Right Ear: External ear normal.  Left Ear: External ear normal.  Mouth/Throat: Oropharynx is clear and moist.  Eyes: Conjunctivae and EOM are normal.  Neck: Normal range of motion. Neck supple. No JVD present. No thyromegaly present.  Cardiovascular: Normal rate, regular rhythm, normal heart sounds and intact distal pulses.   No murmur heard. Pulmonary/Chest: Effort normal and breath sounds normal. She has no wheezes. She has no rales.  Abdominal: Soft. Bowel sounds are normal. She exhibits no distension and no mass. There is no tenderness. There is no rebound and no guarding.  Genitourinary: Vagina normal.  Musculoskeletal: Normal range of motion. She exhibits no edema or tenderness.  Neurological: She is alert and oriented to person, place, and time. She has normal reflexes. No cranial nerve deficit. She exhibits normal muscle tone. Coordination normal.  Skin: Skin  is warm and dry. No rash noted.  Psychiatric: She has a normal mood and affect. Her behavior is normal.          Assessment & Plan:    Preventive health exam.  Flu vaccine and Pneumovax administered CAD HTN HLD  F/u cardiology Wt loss Exercise regimen

## 2015-08-05 NOTE — Progress Notes (Signed)
Pre visit review using our clinic review tool, if applicable. No additional management support is needed unless otherwise documented below in the visit note. 

## 2015-08-05 NOTE — Patient Instructions (Signed)
Limit your sodium (Salt) intake    It is important that you exercise regularly, at least 20 minutes 3 to 4 times per week.  If you develop chest pain or shortness of breath seek  medical attention.  You need to lose weight.  Consider a lower calorie diet and regular exercise.  Menopause is a normal process in which your reproductive ability comes to an end. This process happens gradually over a span of months to years, usually between the ages of 12 and 55. Menopause is complete when you have missed 12 consecutive menstrual periods. It is important to talk with your health care provider about some of the most common conditions that affect postmenopausal women, such as heart disease, cancer, and bone loss (osteoporosis). Adopting a healthy lifestyle and getting preventive care can help to promote your health and wellness. Those actions can also lower your chances of developing some of these common conditions. WHAT SHOULD I KNOW ABOUT MENOPAUSE? During menopause, you may experience a number of symptoms, such as:  Moderate-to-severe hot flashes.  Night sweats.  Decrease in sex drive.  Mood swings.  Headaches.  Tiredness.  Irritability.  Memory problems.  Insomnia. Choosing to treat or not to treat menopausal changes is an individual decision that you make with your health care provider. WHAT SHOULD I KNOW ABOUT HORMONE REPLACEMENT THERAPY AND SUPPLEMENTS? Hormone therapy products are effective for treating symptoms that are associated with menopause, such as hot flashes and night sweats. Hormone replacement carries certain risks, especially as you become older. If you are thinking about using estrogen or estrogen with progestin treatments, discuss the benefits and risks with your health care provider. WHAT SHOULD I KNOW ABOUT HEART DISEASE AND STROKE? Heart disease, heart attack, and stroke become more likely as you age. This may be due, in part, to the hormonal changes that your body  experiences during menopause. These can affect how your body processes dietary fats, triglycerides, and cholesterol. Heart attack and stroke are both medical emergencies. There are many things that you can do to help prevent heart disease and stroke:  Have your blood pressure checked at least every 1-2 years. High blood pressure causes heart disease and increases the risk of stroke.  If you are 56-27 years old, ask your health care provider if you should take aspirin to prevent a heart attack or a stroke.  Do not use any tobacco products, including cigarettes, chewing tobacco, or electronic cigarettes. If you need help quitting, ask your health care provider.  It is important to eat a healthy diet and maintain a healthy weight.  Be sure to include plenty of vegetables, fruits, low-fat dairy products, and lean protein.  Avoid eating foods that are high in solid fats, added sugars, or salt (sodium).  Get regular exercise. This is one of the most important things that you can do for your health.  Try to exercise for at least 150 minutes each week. The type of exercise that you do should increase your heart rate and make you sweat. This is known as moderate-intensity exercise.  Try to do strengthening exercises at least twice each week. Do these in addition to the moderate-intensity exercise.  Know your numbers.Ask your health care provider to check your cholesterol and your blood glucose. Continue to have your blood tested as directed by your health care provider. WHAT SHOULD I KNOW ABOUT CANCER SCREENING? There are several types of cancer. Take the following steps to reduce your risk and to catch any cancer  development as early as possible. Breast Cancer  Practice breast self-awareness.  This means understanding how your breasts normally appear and feel.  It also means doing regular breast self-exams. Let your health care provider know about any changes, no matter how small.  If you  are 41 or older, have a clinician do a breast exam (clinical breast exam or CBE) every year. Depending on your age, family history, and medical history, it may be recommended that you also have a yearly breast X-ray (mammogram).  If you have a family history of breast cancer, talk with your health care provider about genetic screening.  If you are at high risk for breast cancer, talk with your health care provider about having an MRI and a mammogram every year.  Breast cancer (BRCA) gene test is recommended for women who have family members with BRCA-related cancers. Results of the assessment will determine the need for genetic counseling and BRCA1 and for BRCA2 testing. BRCA-related cancers include these types:  Breast. This occurs in males or females.  Ovarian.  Tubal. This may also be called fallopian tube cancer.  Cancer of the abdominal or pelvic lining (peritoneal cancer).  Prostate.  Pancreatic. Cervical, Uterine, and Ovarian Cancer Your health care provider may recommend that you be screened regularly for cancer of the pelvic organs. These include your ovaries, uterus, and vagina. This screening involves a pelvic exam, which includes checking for microscopic changes to the surface of your cervix (Pap test).  For women ages 21-65, health care providers may recommend a pelvic exam and a Pap test every three years. For women ages 14-65, they may recommend the Pap test and pelvic exam, combined with testing for human papilloma virus (HPV), every five years. Some types of HPV increase your risk of cervical cancer. Testing for HPV may also be done on women of any age who have unclear Pap test results.  Other health care providers may not recommend any screening for nonpregnant women who are considered low risk for pelvic cancer and have no symptoms. Ask your health care provider if a screening pelvic exam is right for you.  If you have had past treatment for cervical cancer or a condition  that could lead to cancer, you need Pap tests and screening for cancer for at least 20 years after your treatment. If Pap tests have been discontinued for you, your risk factors (such as having a new sexual partner) need to be reassessed to determine if you should start having screenings again. Some women have medical problems that increase the chance of getting cervical cancer. In these cases, your health care provider may recommend that you have screening and Pap tests more often.  If you have a family history of uterine cancer or ovarian cancer, talk with your health care provider about genetic screening.  If you have vaginal bleeding after reaching menopause, tell your health care provider.  There are currently no reliable tests available to screen for ovarian cancer. Lung Cancer Lung cancer screening is recommended for adults 92-3 years old who are at high risk for lung cancer because of a history of smoking. A yearly low-dose CT scan of the lungs is recommended if you:  Currently smoke.  Have a history of at least 30 pack-years of smoking and you currently smoke or have quit within the past 15 years. A pack-year is smoking an average of one pack of cigarettes per day for one year. Yearly screening should:  Continue until it has been 15  years since you quit.  Stop if you develop a health problem that would prevent you from having lung cancer treatment. Colorectal Cancer  This type of cancer can be detected and can often be prevented.  Routine colorectal cancer screening usually begins at age 56 and continues through age 43.  If you have risk factors for colon cancer, your health care provider may recommend that you be screened at an earlier age.  If you have a family history of colorectal cancer, talk with your health care provider about genetic screening.  Your health care provider may also recommend using home test kits to check for hidden blood in your stool.  A small camera at  the end of a tube can be used to examine your colon directly (sigmoidoscopy or colonoscopy). This is done to check for the earliest forms of colorectal cancer.  Direct examination of the colon should be repeated every 5-10 years until age 18. However, if early forms of precancerous polyps or small growths are found or if you have a family history or genetic risk for colorectal cancer, you may need to be screened more often. Skin Cancer  Check your skin from head to toe regularly.  Monitor any moles. Be sure to tell your health care provider:  About any new moles or changes in moles, especially if there is a change in a mole's shape or color.  If you have a mole that is larger than the size of a pencil eraser.  If any of your family members has a history of skin cancer, especially at a young age, talk with your health care provider about genetic screening.  Always use sunscreen. Apply sunscreen liberally and repeatedly throughout the day.  Whenever you are outside, protect yourself by wearing long sleeves, pants, a wide-brimmed hat, and sunglasses. WHAT SHOULD I KNOW ABOUT OSTEOPOROSIS? Osteoporosis is a condition in which bone destruction happens more quickly than new bone creation. After menopause, you may be at an increased risk for osteoporosis. To help prevent osteoporosis or the bone fractures that can happen because of osteoporosis, the following is recommended:  If you are 56-55 years old, get at least 1,000 mg of calcium and at least 600 mg of vitamin D per day.  If you are older than age 41 but younger than age 36, get at least 1,200 mg of calcium and at least 600 mg of vitamin D per day.  If you are older than age 29, get at least 1,200 mg of calcium and at least 800 mg of vitamin D per day. Smoking and excessive alcohol intake increase the risk of osteoporosis. Eat foods that are rich in calcium and vitamin D, and do weight-bearing exercises several times each week as directed by  your health care provider. WHAT SHOULD I KNOW ABOUT HOW MENOPAUSE AFFECTS [place]? Depression may occur at any age, but it is more common as you become older. Common symptoms of depression include:  Low or sad mood.  Changes in sleep patterns.  Changes in appetite or eating patterns.  Feeling an overall lack of motivation or enjoyment of activities that you previously enjoyed.  Frequent crying spells. Talk with your health care provider if you think that you are experiencing depression. WHAT SHOULD I KNOW ABOUT IMMUNIZATIONS? It is important that you get and maintain your immunizations. These include:  Tetanus, diphtheria, and pertussis (Tdap) booster vaccine.  Influenza every year before the flu season begins.  Pneumonia vaccine.  Shingles vaccine. Your health  care provider may also recommend other immunizations.   This information is not intended to replace advice given to you by your health care provider. Make sure you discuss any questions you have with your health care provider.   Document Released: 11/25/2005 Document Revised: 10/24/2014 Document Reviewed: 06/05/2014 Elsevier Interactive Patient Education [employer].

## 2015-08-21 ENCOUNTER — Other Ambulatory Visit: Payer: Self-pay

## 2015-08-21 DIAGNOSIS — Z1231 Encounter for screening mammogram for malignant neoplasm of breast: Secondary | ICD-10-CM

## 2015-09-21 ENCOUNTER — Other Ambulatory Visit: Payer: Medicare Other

## 2015-09-22 ENCOUNTER — Ambulatory Visit
Admission: RE | Admit: 2015-09-22 | Discharge: 2015-09-22 | Disposition: A | Discharge status: Home / Self Care | Payer: Medicare Other | Source: Ambulatory Visit

## 2015-09-22 DIAGNOSIS — Z1231 Encounter for screening mammogram for malignant neoplasm of breast: Secondary | ICD-10-CM

## 2015-09-24 ENCOUNTER — Other Ambulatory Visit: Payer: Self-pay | Admitting: Internal Medicine

## 2015-09-24 DIAGNOSIS — R928 Other abnormal and inconclusive findings on diagnostic imaging of breast: Secondary | ICD-10-CM

## 2015-09-30 ENCOUNTER — Ambulatory Visit
Admission: RE | Admit: 2015-09-30 | Discharge: 2015-09-30 | Disposition: A | Discharge status: Home / Self Care | Payer: Medicare Other | Source: Ambulatory Visit | Attending: Internal Medicine | Admitting: Internal Medicine

## 2015-09-30 DIAGNOSIS — R921 Mammographic calcification found on diagnostic imaging of breast: Secondary | ICD-10-CM | POA: Diagnosis not present

## 2015-09-30 DIAGNOSIS — R928 Other abnormal and inconclusive findings on diagnostic imaging of breast: Secondary | ICD-10-CM

## 2015-10-05 ENCOUNTER — Other Ambulatory Visit (INDEPENDENT_AMBULATORY_CARE_PROVIDER_SITE_OTHER): Payer: Medicare Other | Admitting: *Deleted

## 2015-10-05 DIAGNOSIS — E785 Hyperlipidemia, unspecified: Secondary | ICD-10-CM | POA: Diagnosis not present

## 2015-10-05 DIAGNOSIS — I10 Essential (primary) hypertension: Secondary | ICD-10-CM | POA: Diagnosis not present

## 2015-10-05 LAB — LIPID PANEL
Cholesterol: 164 mg/dL (ref 125–200)
HDL: 34 mg/dL — ABNORMAL LOW (ref >=46)
LDL Cholesterol: 92 mg/dL (ref <130)
Total CHOL/HDL Ratio: 4.8 Ratio (ref <=5.0)
Triglycerides: 188 mg/dL — ABNORMAL HIGH (ref <150)
VLDL: 38 mg/dL — ABNORMAL HIGH (ref <30)

## 2015-10-05 LAB — HEPATIC FUNCTION PANEL
ALT: 44 U/L — ABNORMAL HIGH (ref 6–29)
AST: 45 U/L — ABNORMAL HIGH (ref 10–35)
Albumin: 3.4 g/dL — ABNORMAL LOW (ref 3.6–5.1)
Alkaline Phosphatase: 75 U/L (ref 33–130)
Bilirubin, Direct: <0.1 mg/dL (ref <=0.2)
Total Bilirubin: 0.4 mg/dL (ref 0.2–1.2)
Total Protein: 7.8 g/dL (ref 6.1–8.1)

## 2015-10-07 ENCOUNTER — Other Ambulatory Visit: Payer: Self-pay

## 2015-10-07 DIAGNOSIS — E785 Hyperlipidemia, unspecified: Secondary | ICD-10-CM

## 2015-10-29 ENCOUNTER — Other Ambulatory Visit: Payer: Self-pay | Admitting: Cardiovascular Disease

## 2015-10-29 ENCOUNTER — Other Ambulatory Visit: Payer: Self-pay | Admitting: Internal Medicine

## 2015-12-28 ENCOUNTER — Telehealth: Payer: Self-pay | Admitting: Internal Medicine

## 2015-12-28 ENCOUNTER — Encounter: Payer: Self-pay | Admitting: Family Medicine

## 2015-12-28 ENCOUNTER — Ambulatory Visit (INDEPENDENT_AMBULATORY_CARE_PROVIDER_SITE_OTHER): Payer: Medicare Other | Admitting: Family Medicine

## 2015-12-28 VITALS — BP 120/78 | HR 72 | Temp 97.6°F | Ht 64.5 in

## 2015-12-28 DIAGNOSIS — M79672 Pain in left foot: Secondary | ICD-10-CM

## 2015-12-28 LAB — URIC ACID: Uric Acid, Serum: 9.8 mg/dL — ABNORMAL HIGH (ref 2.4–7.0)

## 2015-12-28 MED ORDER — PREDNISONE 10 MG PO TABS: ORAL_TABLET | ORAL | Status: DC

## 2015-12-28 MED ORDER — HYDROCODONE-ACETAMINOPHEN 5-325 MG PO TABS: ORAL_TABLET | ORAL | Status: DC

## 2015-12-28 NOTE — Telephone Encounter (Signed)
Rx printed and signed by Dr. Sherren Mocha, will give to pt when she comes in for her appt today.

## 2015-12-28 NOTE — Progress Notes (Signed)
HPI:  Tracy [name] is a pleasant 70 yo with a PMH sig for MI, CAD, HTN, CKD and HLD here for an acute visit for L foot pain. Reports pain, swelling, heat and redness in the L 1st MTP joint for about 3-4 days. Reports history of similar symptoms in the same joint in the past that resolved with aleve. Denies fevers, malaise, spreading redness, weakness, numbness, rash, break in the skin.  ROS: See pertinent positives and negatives per HPI.  Past Medical History  Diagnosis Date  . Headache(784.0)   . Hypertension   . Myocardial infarct (HCC)     hx of  . Hyperlipidemia   . Menopausal syndrome   . Allergic rhinitis   . Low back pain   . Overactive bladder     Past Surgical History  Procedure Laterality Date  . Tonsillectomy    . Angioplasty      stent 2007    Family History  Problem Relation Age of Onset  . Adopted: Yes  . Other      patient is adopted    Social History   Social History  . Marital Status: Married    Spouse Name: N/A  . Number of Children: N/A  . 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 Smoking status: Former Smoker    Quit date: 07/01/2012  . Smokeless tobacco: Never Used     Comment: electronic cigarettes  . Alcohol Use: No  . Drug Use: No  . Sexual Activity: Not Asked   Other Topics Concern  . None   Social History Narrative     Current outpatient prescriptions:  .  ALPRAZolam (XANAX) 0.25 MG tablet, TAKE 1 TABLET BY MOUTH TWICE A DAY AS NEEDED FOR ANXIETY, Disp: 60 tablet, Rfl: 2 .  aspirin 81 MG tablet, Take 81 mg by mouth daily.  , Disp: , Rfl:  .  atorvastatin (LIPITOR) 40 MG tablet, TAKE 1 TABLET BY MOUTH DAILY., Disp: 90 tablet, Rfl: 2 .  cholecalciferol (VITAMIN D) 1000 UNITS tablet, Take 1,000 Units by mouth daily.  , Disp: , Rfl:  .  cyanocobalamin 2000 MCG tablet, Take 2,500 mcg by mouth daily. , Disp: , Rfl:  .  HYDROcodone-acetaminophen  (NORCO/VICODIN) 5-325 MG tablet, TAKE 1 TABLET EVERY 6 HOURS AS NEEDED FOR PAIN, Disp: 60 tablet, Rfl: 0 .  lisinopril-hydrochlorothiazide (PRINZIDE,ZESTORETIC) 20-25 MG tablet, TAKE 1 TABLET BY MOUTH DAILY., Disp: 90 tablet, Rfl: 1 .  metoprolol (LOPRESSOR) 100 MG tablet, TAKE 1 TABLET BY MOUTH 2 TIMES DAILY., Disp: 180 tablet, Rfl: 1 .  Multiple Vitamin (MULTIVITAMIN) tablet, Take 1 tablet by mouth daily.  , Disp: , Rfl:  .  nitroGLYCERIN (NITROSTAT) 0.4 MG SL tablet, Place 1 tablet (0.4 mg total) under the tongue every 5 (five) minutes as needed., Disp: 15 tablet, Rfl: 6 .  Omega-3 Fatty Acids (FISH OIL) 1000 MG CAPS, Take by mouth.  , Disp: , Rfl:  .  pantoprazole (PROTONIX) 40 MG tablet, TAKE 1 TABLET (40 MG TOTAL) BY MOUTH DAILY., Disp: 90 tablet, Rfl: 1 .  zinc gluconate 50 MG tablet, Take 50 mg by mouth daily., Disp: , Rfl:  .  predniSONE (DELTASONE) 10 MG tablet, 30 mg (3 tablets) daily for 3-5 days until imrpoving, then taper with 20( 2 tablets)mg daily for 2 days, then 10mg  (1 tablet) daily for 3 days, Disp: 2 tablet, Rfl: 0  EXAM:  Filed Vitals:   12/28/15 1057  BP: 120/78  Pulse: 72  Temp: 97.6 F (36.4 C)    There is no weight on file to calculate BMI.  GENERAL: vitals reviewed and listed above, alert, oriented, appears well hydrated and in no acute distress  HEENT: atraumatic, conjunttiva clear, no obvious abnormalities on inspection of external nose and ears  NECK: no obvious masses on inspection  LUNGS: clear to auscultation bilaterally, no wheezes, rales or rhonchi, good air movement  CV: HRRR, no peripheral edema  MS: moves all extremities without noticeable abnormality, mild erythema, swelling and warmth in the L MTP joint region, can move joint, normal cap refill, no signs of cellulitis or skin infection or break in skin, minor callous of foot without cracking or underlying fluctuance.  PSYCH: pleasant and cooperative, no obvious depression or  anxiety  ASSESSMENT AND PLAN:  Discussed the following assessment and plan:  Left foot pain - Plan: Uric Acid  -likely gout or flare of OA given exam and history - however discussed other potential causes and evaluation and advised tap of joint would be required to definitely diagnose and exclude infection, etc. She is adamant about not doing a tap. Discussed other options and given classic hx and exam findings for gout, her underlying medical issues which are relative contraindications for nsaids, duration of symptoms which would make colchicine less useful, opted to treat with prednisone after discussion risks. Advised prompt ortho eval if worsening, spreading or not improving as expected. She agreed. Uric acid level today - though explained less likely elevated given acute flare. -Patient advised to return or notify a doctor immediately if symptoms worsen or persist or new concerns arise.  Patient Instructions  BEFORE YOU LEAVE: -labs  This is mostly likely gout or arthritis and the prednisone should help this. Please follow up instructions provided with the medication.  However, as we discussed, it would require a needle in the joint to make a conclusive diagnosis and exclude infection or other causes and confirm diagnosis. If this is worsening, you have spreading swelling or redness, fevers or are feeling sick please seek emergency care as we discussed.         Colin Benton R.

## 2015-12-28 NOTE — Progress Notes (Signed)
Pre visit review using our clinic review tool, if applicable. No additional management support is needed unless otherwise documented below in the visit note. Patient was given the signed Rx for Hydrocodone by Dr Sherren Mocha today when I checked her in. Weight not measured today due to difficulty walking.

## 2015-12-28 NOTE — Telephone Encounter (Signed)
Pt states she has infection in her foot. Pt has appointment with Dr Maudie Mercury at 11 am. Pt also asked if she could have refill of  HYDROcodone-acetaminophen (NORCO/VICODIN) 5-325 MG tablet  Advised pt I would need to ask you.

## 2015-12-28 NOTE — Addendum Note (Signed)
Addended by: Agnes Lawrence on: 12/28/2015 11:31 AM   Modules accepted: Orders

## 2015-12-28 NOTE — Telephone Encounter (Signed)
Already took care of it. Rx will be given to pt at her visit.

## 2015-12-28 NOTE — Patient Instructions (Signed)
BEFORE YOU LEAVE: -labs  This is mostly likely gout or arthritis and the prednisone should help this. Please follow up instructions provided with the medication.  However, as we discussed, it would require a needle in the joint to make a conclusive diagnosis and exclude infection or other causes and confirm diagnosis. If this is worsening, you have spreading swelling or redness, fevers or are feeling sick please seek emergency care as we discussed.

## 2015-12-28 NOTE — Telephone Encounter (Signed)
Patient needs a refill on Hydrocodone-Acetaminophen  

## 2016-01-10 ENCOUNTER — Encounter (HOSPITAL_COMMUNITY): Payer: Self-pay

## 2016-01-10 ENCOUNTER — Emergency Department (HOSPITAL_COMMUNITY)
Admission: EM | Admit: 2016-01-10 | Discharge: 2016-01-10 | Disposition: A | Discharge status: Home / Self Care | Payer: Medicare Other | Attending: Emergency Medicine | Admitting: Emergency Medicine

## 2016-01-10 DIAGNOSIS — R0989 Other specified symptoms and signs involving the circulatory and respiratory systems: Secondary | ICD-10-CM | POA: Insufficient documentation

## 2016-01-10 DIAGNOSIS — R05 Cough: Secondary | ICD-10-CM | POA: Diagnosis not present

## 2016-01-10 DIAGNOSIS — I10 Essential (primary) hypertension: Secondary | ICD-10-CM | POA: Diagnosis not present

## 2016-01-10 DIAGNOSIS — I252 Old myocardial infarction: Secondary | ICD-10-CM | POA: Diagnosis not present

## 2016-01-10 NOTE — ED Notes (Signed)
When triage was completed pt reported she is not going to wait to be seen.  Encouraged pt to stay, pt declined.  Advised pt to return if symptoms worsens or further concerns and to follow up with doctor tomorrow.  Pt and husband verbalized understanding.

## 2016-01-10 NOTE — ED Notes (Signed)
Onset several weeks cough, chest congestion.  Onset 2 days fever.  No respiratory difficulties.  Talking in complete sentences.

## 2016-01-12 ENCOUNTER — Ambulatory Visit (INDEPENDENT_AMBULATORY_CARE_PROVIDER_SITE_OTHER): Payer: Medicare Other | Admitting: Internal Medicine

## 2016-01-12 ENCOUNTER — Encounter: Payer: Self-pay | Admitting: Internal Medicine

## 2016-01-12 ENCOUNTER — Ambulatory Visit (INDEPENDENT_AMBULATORY_CARE_PROVIDER_SITE_OTHER)
Admission: RE | Admit: 2016-01-12 | Discharge: 2016-01-12 | Disposition: A | Discharge status: Home / Self Care | Payer: Medicare Other | Source: Ambulatory Visit | Attending: Internal Medicine | Admitting: Internal Medicine

## 2016-01-12 VITALS — BP 100/70 | HR 67 | Temp 98.1°F | Resp 20 | Ht 65.0 in | Wt 184.0 lb

## 2016-01-12 DIAGNOSIS — R059 Cough, unspecified: Secondary | ICD-10-CM

## 2016-01-12 DIAGNOSIS — R509 Fever, unspecified: Secondary | ICD-10-CM

## 2016-01-12 DIAGNOSIS — Z720 Tobacco use: Secondary | ICD-10-CM

## 2016-01-12 DIAGNOSIS — R05 Cough: Secondary | ICD-10-CM

## 2016-01-12 DIAGNOSIS — J309 Allergic rhinitis, unspecified: Secondary | ICD-10-CM | POA: Diagnosis not present

## 2016-01-12 DIAGNOSIS — I10 Essential (primary) hypertension: Secondary | ICD-10-CM

## 2016-01-12 MED ORDER — HYDROCODONE-HOMATROPINE 5-1.5 MG/5ML PO SYRP: 5.0000 mL | ORAL_SOLUTION | Freq: Four times a day (QID) | ORAL | Status: DC | PRN

## 2016-01-12 NOTE — Progress Notes (Signed)
Pre visit review using our clinic review tool, if applicable. No additional management support is needed unless otherwise documented below in the visit note. 

## 2016-01-12 NOTE — Patient Instructions (Signed)
Acute bronchitis symptoms for less than 10 days are generally not helped by antibiotics.  Take over-the-counter expectorants and cough medications such as  Mucinex DM.  Call if there is no improvement in 5 to 7 days or if  you develop worsening cough, fever, or new symptoms, such as shortness of breath or chest pain.  Chest x-ray as discussed  Drink as much fluid as you  can tolerate over the next few days

## 2016-01-12 NOTE — Progress Notes (Signed)
Subjective:    Patient ID: Tracy [name], female    DOB: [birth year], 70 y.o.   MRN: [id number]  HPI  70 year old patient who presents with a chief complaint of cough.  This has been intermittent over the past couple weeks.  Over the weekend she developed a fever.  Cough is nonproductive.  She has essential hypertension and a history of coronary artery disease.  Fever has resolved  Past Medical History  Diagnosis Date  . Headache(784.0)   . Hypertension   . Myocardial infarct (HCC)     hx of  . Hyperlipidemia   . Menopausal syndrome   . Allergic rhinitis   . Low back pain   . Overactive bladder     Social History   Social History  . Marital Status: Married    Spouse Name: N/A  . Number of Children: N/A  . 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 Smoking status: Former Smoker    Quit date: 07/01/2012  . Smokeless tobacco: Never Used     Comment: electronic cigarettes  . Alcohol Use: No  . Drug Use: No  . Sexual Activity: Not on file   Other Topics Concern  . Not on file   Social History Narrative    Past Surgical History  Procedure Laterality Date  . Tonsillectomy    . Angioplasty      stent 2007    Family History  Problem Relation Age of Onset  . Adopted: Yes  . Other      patient is adopted    Allergies  Allergen Reactions  . Erythromycin   . Sulfamethoxazole     REACTION: unspecified    Current Outpatient Prescriptions on File Prior to Visit  Medication Sig Dispense Refill  . ALPRAZolam (XANAX) 0.25 MG tablet TAKE 1 TABLET BY MOUTH TWICE A DAY AS NEEDED FOR ANXIETY 60 tablet 2  . aspirin 81 MG tablet Take 81 mg by mouth daily.      Marland Kitchen atorvastatin (LIPITOR) 40 MG tablet TAKE 1 TABLET BY MOUTH DAILY. 90 tablet 2  . cyanocobalamin 2000 MCG tablet Take 2,500 mcg by mouth daily.     Marland Kitchen HYDROcodone-acetaminophen (NORCO/VICODIN) 5-325 MG tablet TAKE 1 TABLET EVERY 6  HOURS AS NEEDED FOR PAIN 60 tablet 0  . lisinopril-hydrochlorothiazide (PRINZIDE,ZESTORETIC) 20-25 MG tablet TAKE 1 TABLET BY MOUTH DAILY. 90 tablet 1  . metoprolol (LOPRESSOR) 100 MG tablet TAKE 1 TABLET BY MOUTH 2 TIMES DAILY. 180 tablet 1  . nitroGLYCERIN (NITROSTAT) 0.4 MG SL tablet Place 1 tablet (0.4 mg total) under the tongue every 5 (five) minutes as needed. 15 tablet 6  . pantoprazole (PROTONIX) 40 MG tablet TAKE 1 TABLET (40 MG TOTAL) BY MOUTH DAILY. 90 tablet 1   No current facility-administered medications on file prior to visit.    BP 100/70 mmHg  Pulse 67  Temp(Src) 98.1 F (36.7 C) (Oral)  Resp 20  Ht 5\' 5"  (1.651 m)  Wt 184 lb (83.462 kg)  BMI 30.62 kg/m2  SpO2 93%     Review of Systems  Constitutional: Positive for fever and fatigue.  HENT: Negative for congestion, dental problem, hearing loss, rhinorrhea, sinus pressure, sore throat and tinnitus.   Eyes: Negative for pain, discharge and visual disturbance.  Respiratory: Positive for cough. Negative for shortness of breath.   Cardiovascular: Negative for chest pain, palpitations and leg swelling.  Gastrointestinal: Negative  for nausea, vomiting, abdominal pain, diarrhea, constipation, blood in stool and abdominal distention.  Genitourinary: Negative for dysuria, urgency, frequency, hematuria, flank pain, vaginal bleeding, vaginal discharge, difficulty urinating, vaginal pain and pelvic pain.  Musculoskeletal: Negative for joint swelling, arthralgias and gait problem.  Skin: Negative for rash.  Neurological: Negative for dizziness, syncope, speech difficulty, weakness, numbness and headaches.  Hematological: Negative for adenopathy.  Psychiatric/Behavioral: Negative for behavioral problems, dysphoric mood and agitation. The patient is not nervous/anxious.        Objective:   Physical Exam  Constitutional: She is oriented to person, place, and time. She appears well-developed and well-nourished.  HENT:  Head:  Normocephalic.  Right Ear: External ear normal.  Left Ear: External ear normal.  Mouth/Throat: Oropharynx is clear and moist.  Eyes: Conjunctivae and EOM are normal. Pupils are equal, round, and reactive to light.  Neck: Normal range of motion. Neck supple. No thyromegaly present.  Cardiovascular: Normal rate, regular rhythm, normal heart sounds and intact distal pulses.   Pulmonary/Chest: Effort normal and breath sounds normal. She has no rales.  Rales noted at the right base  Abdominal: Soft. Bowel sounds are normal. She exhibits no mass. There is no tenderness.  Musculoskeletal: Normal range of motion.  Lymphadenopathy:    She has no cervical adenopathy.  Neurological: She is alert and oriented to person, place, and time.  Skin: Skin is warm and dry. No rash noted.  Psychiatric: She has a normal mood and affect. Her behavior is normal.          Assessment & Plan:   URI with cough.  Rule out right lower lobe pneumonia.  Chest x-ray will be reviewed We'll treat cough aggressively Essential hypertension, stable

## 2016-02-01 ENCOUNTER — Ambulatory Visit: Payer: Medicare Other | Admitting: Internal Medicine

## 2016-02-18 ENCOUNTER — Telehealth: Payer: Self-pay | Admitting: Cardiovascular Disease

## 2016-02-18 NOTE — Telephone Encounter (Signed)
I called and spoke with the patient. She states that she has had intermittent left breast/ armpit discomfort over the last week. She cannot recall if she moved something heavy. She reports that the pain under her arm pit feels worse when she presses on it, but that the discomfort around her left breast is not worse with pressing on it or change in position. She took 2 NTG yesterday with relief. She has had some episodes of "light" nausea.  She took aleve this morning for chest discomfort and this did help some. The patient has a history of MI with PCI in 2007, but cannot recall if symptoms are similar. She did remember having neck soreness with her MI.  She is asymptomatic at this time. I have advised her that with her symptoms and history, that she should be seen for evaluation. I have scheduled her to come in at 8:30 am with Dr. Harrington Challenger. She is agreeable. I have also advised her that if she has more symptoms this evening that requires NTG w/ relief, she should report to the ER. She verbalizes understanding.

## 2016-02-18 NOTE — Telephone Encounter (Signed)
Pt c/o of Chest Pain: STAT if CP now or developed within 24 hours  1. Are you having CP right now? no  2. Are you experiencing any other symptoms (ex. SOB, nausea, vomiting, sweating)? Little nausea   3. How long have you been experiencing CP? About a week   4. Is your CP continuous or coming and going? Comes goes  5. Have you taken Nitroglycerin? Yes- yesterday 5/3- relieved the pain after taking 2  ?

## 2016-02-19 ENCOUNTER — Encounter: Payer: Self-pay | Admitting: Internal Medicine

## 2016-02-19 ENCOUNTER — Ambulatory Visit (INDEPENDENT_AMBULATORY_CARE_PROVIDER_SITE_OTHER): Payer: Medicare Other | Admitting: Internal Medicine

## 2016-02-19 VITALS — BP 130/84 | HR 74 | Ht 65.0 in | Wt 180.1 lb

## 2016-02-19 DIAGNOSIS — I2510 Atherosclerotic heart disease of native coronary artery without angina pectoris: Secondary | ICD-10-CM

## 2016-02-19 NOTE — Progress Notes (Signed)
Cardiology Office Note   Date:  02/19/2016   ID:  Tracy [name], DOB 1946/08/09, MRN [id number]  PCP:  Nyoka Cowden, MD  Cardiologist:   Ezzie Dural    No chief complaint on file. Pt presents for eval of CP      History of Present Illness: Tracy [name] is a 70 y.o. female with a history of CAD  Remote MI with PCI in 200[address]  Pt had neck pain at time Seen by Georganna Skeans in Sept 2016    The pt called the clinic yesterday complaining of CP   Pain in left chest and L arm pit  Occur at rest  She doesn't do much  Started last week  Now more fleeting  Not associated with SOB     Outpatient Prescriptions Prior to Visit  Medication Sig Dispense Refill  . ALPRAZolam (XANAX) 0.25 MG tablet TAKE 1 TABLET BY MOUTH TWICE A DAY AS NEEDED FOR ANXIETY 60 tablet 2  . aspirin 81 MG tablet Take 81 mg by mouth daily.      Marland Kitchen atorvastatin (LIPITOR) 40 MG tablet TAKE 1 TABLET BY MOUTH DAILY. 90 tablet 2  . cyanocobalamin 2000 MCG tablet Take 2,500 mcg by mouth daily.     Marland Kitchen HYDROcodone-acetaminophen (NORCO/VICODIN) 5-325 MG tablet TAKE 1 TABLET EVERY 6 HOURS AS NEEDED FOR PAIN 60 tablet 0  . HYDROcodone-homatropine (HYCODAN) 5-1.5 MG/5ML syrup Take 5 mLs by mouth every 6 (six) hours as needed for cough. 120 mL 0  . lisinopril-hydrochlorothiazide (PRINZIDE,ZESTORETIC) 20-25 MG tablet TAKE 1 TABLET BY MOUTH DAILY. 90 tablet 1  . metoprolol (LOPRESSOR) 100 MG tablet TAKE 1 TABLET BY MOUTH 2 TIMES DAILY. 180 tablet 1  . nitroGLYCERIN (NITROSTAT) 0.4 MG SL tablet Place 1 tablet (0.4 mg total) under the tongue every 5 (five) minutes as needed. 15 tablet 6  . pantoprazole (PROTONIX) 40 MG tablet TAKE 1 TABLET (40 MG TOTAL) BY MOUTH DAILY. 90 tablet 1   No facility-administered medications prior to visit.     Allergies:   Erythromycin and Sulfamethoxazole   Past Medical History  Diagnosis Date  . Headache(784.0)   . Hypertension   . Myocardial infarct (HCC)     hx of  .  Hyperlipidemia   . Menopausal syndrome   . Allergic rhinitis   . Low back pain   . Overactive bladder     Past Surgical History  Procedure Laterality Date  . Tonsillectomy    . Angioplasty      stent 2007     Social History:  The patient  reports that she quit smoking about 3 years ago. She has never used smokeless tobacco. She reports that she does not drink alcohol or use illicit drugs.   Family History:  The patient's family history is not on file. She was adopted.    ROS:  Please see the history of present illness. All other systems are reviewed and  Negative to the above problem except as noted.    PHYSICAL EXAM: VS:  BP 130/84 mmHg  Pulse 74  Ht 5\' 5"  (1.651 m)  Wt 180 lb 1.9 oz (81.702 kg)  BMI 29.97 kg/m2  GEN: Well nourished, well developed, in no acute distress HEENT: normal Neck: no JVD, carotid bruits, or masses Cardiac: RRR; no murmurs, rubs, or gallops,no edema  Chest  Tender L chest   Respiratory:  clear to auscultation bilaterally, normal work of breathing GI: soft, nontender, nondistended, + BS  No hepatomegaly  MS: no deformity Moving all extremities   Skin: warm and dry, no rash Neuro:  Strength and sensation are intact Psych: euthymic mood, full affect   EKG:  EKG is ordered today. SR 74 bpm     Lipid Panel    Component Value Date/Time   CHOL 164 10/05/2015 0806   TRIG 188* 10/05/2015 0806   HDL 34* 10/05/2015 0806   CHOLHDL 4.8 10/05/2015 0806   VLDL 38* 10/05/2015 0806   LDLCALC 92 10/05/2015 0806   LDLDIRECT 111.0 01/05/2015 1019      Wt Readings from Last 3 Encounters:  02/19/16 180 lb 1.9 oz (81.702 kg)  01/12/16 184 lb (83.462 kg)  01/10/16 182 lb 6.4 oz (82.736 kg)      ASSESSMENT AND PLAN:  1  CP  Atypical for angina  I think probably musculoskeletal  Follow  Slowly increase activity  I would not recomm testing for now unless symptoms change  2.  CAD  Continue medical Rx  3.  HL  Continue statin    4  HTN  Adequate  control    F/U as previouslly sched with Ezzie Dural     Signed, Dorris Carnes, MD  02/19/2016 8:50 AM    [address], [place], [place]  [address] Phone: [phone]; Fax: [phone]

## 2016-03-21 ENCOUNTER — Other Ambulatory Visit: Payer: Self-pay | Admitting: Internal Medicine

## 2016-03-21 DIAGNOSIS — R921 Mammographic calcification found on diagnostic imaging of breast: Secondary | ICD-10-CM

## 2016-03-31 ENCOUNTER — Other Ambulatory Visit: Payer: Self-pay | Admitting: Internal Medicine

## 2016-03-31 ENCOUNTER — Ambulatory Visit
Admission: RE | Admit: 2016-03-31 | Discharge: 2016-03-31 | Disposition: A | Discharge status: Home / Self Care | Payer: Medicare Other | Source: Ambulatory Visit | Attending: Internal Medicine | Admitting: Internal Medicine

## 2016-03-31 DIAGNOSIS — R921 Mammographic calcification found on diagnostic imaging of breast: Secondary | ICD-10-CM | POA: Diagnosis not present

## 2016-04-04 ENCOUNTER — Ambulatory Visit (INDEPENDENT_AMBULATORY_CARE_PROVIDER_SITE_OTHER): Payer: Medicare Other | Admitting: Internal Medicine

## 2016-04-04 ENCOUNTER — Encounter: Payer: Self-pay | Admitting: Internal Medicine

## 2016-04-04 VITALS — BP 120/70 | HR 68 | Temp 97.8°F | Resp 20 | Ht 65.0 in | Wt 183.0 lb

## 2016-04-04 DIAGNOSIS — M150 Primary generalized (osteo)arthritis: Secondary | ICD-10-CM | POA: Diagnosis not present

## 2016-04-04 DIAGNOSIS — I2510 Atherosclerotic heart disease of native coronary artery without angina pectoris: Secondary | ICD-10-CM | POA: Diagnosis not present

## 2016-04-04 DIAGNOSIS — M159 Polyosteoarthritis, unspecified: Secondary | ICD-10-CM

## 2016-04-04 DIAGNOSIS — E7800 Pure hypercholesterolemia, unspecified: Secondary | ICD-10-CM

## 2016-04-04 DIAGNOSIS — M8949 Other hypertrophic osteoarthropathy, multiple sites: Secondary | ICD-10-CM

## 2016-04-04 DIAGNOSIS — I10 Essential (primary) hypertension: Secondary | ICD-10-CM | POA: Diagnosis not present

## 2016-04-04 MED ORDER — PREDNISONE 20 MG PO TABS: 20.0000 mg | ORAL_TABLET | Freq: Two times a day (BID) | ORAL | Status: DC

## 2016-04-04 MED ORDER — NITROGLYCERIN 0.4 MG SL SUBL: 0.4000 mg | SUBLINGUAL_TABLET | SUBLINGUAL | Status: DC | PRN

## 2016-04-04 MED ORDER — HYDROCODONE-ACETAMINOPHEN 5-325 MG PO TABS: ORAL_TABLET | ORAL | Status: DC

## 2016-04-04 NOTE — Progress Notes (Signed)
Pre visit review using our clinic review tool, if applicable. No additional management support is needed unless otherwise documented below in the visit note. 

## 2016-04-04 NOTE — Patient Instructions (Signed)
Prednisone 20 mg by mouth twice daily for 5 days  Gout Gout is when your joints become red, sore, and swell (inflamed). This is caused by the buildup of uric acid crystals in the joints. Uric acid is a chemical that is normally in the blood. If the level of uric acid gets too high in the blood, these crystals form in your joints and tissues. Over time, these crystals can form into masses near the joints and tissues. These masses can destroy bone and cause the bone to look misshapen (deformed). HOME CARE   Do not take aspirin for pain.  Only take medicine as told by your doctor.  Rest the joint as much as you can. When in bed, keep sheets and blankets off painful areas.  Keep the sore joints raised (elevated).  Put warm or cold packs on painful joints. Use of warm or cold packs depends on which works best for you.  Use crutches if the painful joint is in your leg.  Drink enough fluids to keep your pee (urine) clear or pale yellow. Limit alcohol, sugary drinks, and drinks with fructose in them.  Follow your diet instructions. Pay careful attention to how much protein you eat. Include fruits, vegetables, whole grains, and fat-free or low-fat milk products in your daily diet. Talk to your doctor or dietitian about the use of coffee, vitamin C, and cherries. These may help lower uric acid levels.  Keep a healthy body weight. GET HELP RIGHT AWAY IF:   You have watery poop (diarrhea), throw up (vomit), or have any side effects from medicines.  You do not feel better in 24 hours, or you are getting worse.  Your joint becomes suddenly more tender, and you have chills or a fever. MAKE SURE YOU:   Understand these instructions.  Will watch your condition.  Will get help right away if you are not doing well or get worse.   This information is not intended to replace advice given to you by your health care provider. Make sure you discuss any questions you have with your health care  provider.   Document Released: 07/12/2008 Document Revised: 10/24/2014 Document Reviewed: 05/16/2012 Elsevier Interactive Patient Education 2016 [place] are compounds that affect the level of uric acid in your body. A low-purine diet is a diet that is low in purines. Eating a low-purine diet can prevent the level of uric acid in your body from getting too high and causing gout or kidney stones or both. WHAT DO I NEED TO KNOW ABOUT THIS DIET?  Choose low-purine foods. Examples of low-purine foods are listed in the next section.  Drink plenty of fluids, especially water. Fluids can help remove uric acid from your body. Try to drink 8-16 cups (1.9-3.8 L) a day.  Limit foods high in fat, especially saturated fat, as fat makes it harder for the body to get rid of uric acid. Foods high in saturated fat include pizza, cheese, ice cream, whole milk, fried foods, and gravies. Choose foods that are lower in fat and lean sources of protein. Use olive oil when cooking as it contains healthy fats that are not high in saturated fat.  Limit alcohol. Alcohol interferes with the elimination of uric acid from your body. If you are having a gout attack, avoid all alcohol.  Keep in mind that different people's bodies react differently to different foods. You will probably learn over time which foods do or do not affect you. If you  discover that a food tends to cause your gout to flare up, avoid eating that food. You can more freely enjoy foods that do not cause problems. If you have any questions about a food item, talk to your dietitian or health care provider. WHICH FOODS ARE LOW, MODERATE, AND HIGH IN PURINES? The following is a list of foods that are low, moderate, and high in purines. You can eat any amount of the foods that are low in purines. You may be able to have small amounts of foods that are moderate in purines. Ask your health care provider how much of a food moderate in  purines you can have. Avoid foods high in purines. Grains  Foods low in purines: Enriched white bread, pasta, rice, cake, cornbread, popcorn.  Foods moderate in purines: Whole-grain breads and cereals, wheat germ, bran, oatmeal. Uncooked oatmeal. Dry wheat bran or wheat germ.  Foods high in purines: Pancakes, [place] toast, biscuits, muffins. Vegetables  Foods low in purines: All vegetables, except those that are moderate in purines.  Foods moderate in purines: Asparagus, cauliflower, spinach, mushrooms, green peas. Fruits  All fruits are low in purines. Meats and other Protein Foods  Foods low in purines: Eggs, nuts, peanut butter.  Foods moderate in purines: 80-90% lean beef, lamb, veal, pork, poultry, fish, eggs, peanut butter, nuts. Crab, lobster, oysters, and shrimp. Cooked dried beans, peas, and lentils.  Foods high in purines: Anchovies, sardines, herring, mussels, tuna, codfish, scallops, trout, and haddock. [place] Salines. Organ meats (such as liver or kidney). [place]. Game meat. Goose. Sweetbreads. Dairy  All dairy foods are low in purines. Low-fat and fat-free dairy products are best because they are low in saturated fat. Beverages  Drinks low in purines: Water, carbonated beverages, tea, coffee, cocoa.  Drinks moderate in purines: Soft drinks and other drinks sweetened with high-fructose corn syrup. Juices. To find whether a food or drink is sweetened with high-fructose corn syrup, look at the ingredients list.  Drinks high in purines: Alcoholic beverages (such as beer). Condiments  Foods low in purines: Salt, herbs, olives, pickles, relishes, vinegar.  Foods moderate in purines: Butter, margarine, oils, mayonnaise. Fats and Oils  Foods low in purines: All types, except gravies and sauces made with meat.  Foods high in purines: Gravies and sauces made with meat. Other Foods  Foods low in purines: Sugars, sweets, gelatin. Cake. Soups made without meat.  Foods moderate  in purines: Meat-based or fish-based soups, broths, or bouillons. Foods and drinks sweetened with high-fructose corn syrup.  Foods high in purines: High-fat desserts (such as ice cream, cookies, cakes, pies, doughnuts, and chocolate). Contact your dietitian for more information on foods that are not listed here.   This information is not intended to replace advice given to you by your health care provider. Make sure you discuss any questions you have with your health care provider.   Document Released: 01/28/2011 Document Revised: 10/08/2013 Document Reviewed: 09/09/2013 Elsevier Interactive Patient Education [employer].

## 2016-04-04 NOTE — Progress Notes (Signed)
Subjective:    Patient ID: Tracy [name], female    DOB: 1946/04/01, 70 y.o.   MRN: [id number]  HPI   70 year old patient who has essential hypertension.  She was seen 3 months ago and treated for probable gout involving the left first MTP joint.  She responded very quickly to prednisone therapy.   For the past week she has had a painful right foot.  Pain is not very well localized in maximal involving the lateral aspect of the foot but also along the dorsum.   She did have a uric acid level performed 3 months ago which was elevated.   No real change in her diet or other aggravating factors.  Nondrinker.  Past Medical History  Diagnosis Date  . Headache(784.0)   . Hypertension   . Myocardial infarct (HCC)     hx of  . Hyperlipidemia   . Menopausal syndrome   . Allergic rhinitis   . Low back pain   . Overactive bladder      Social History   Social History  . Marital Status: Married    Spouse Name: N/A  . Number of Children: N/A  . 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 Smoking status: Former Smoker    Quit date: 07/01/2012  . Smokeless tobacco: Never Used     Comment: electronic cigarettes  . Alcohol Use: No  . Drug Use: No  . Sexual Activity: Not on file   Other Topics Concern  . Not on file   Social History Narrative    Past Surgical History  Procedure Laterality Date  . Tonsillectomy    . Angioplasty      stent 2007    Family History  Problem Relation Age of Onset  . Adopted: Yes  . Other      patient is adopted    Allergies  Allergen Reactions  . Erythromycin   . Sulfamethoxazole     REACTION: unspecified    Current Outpatient Prescriptions on File Prior to Visit  Medication Sig Dispense Refill  . ALPRAZolam (XANAX) 0.25 MG tablet TAKE 1 TABLET BY MOUTH TWICE A DAY AS NEEDED FOR ANXIETY 60 tablet 2  . Ascorbic Acid (VITAMIN C) 1000 MG tablet Take 1,000 mg by  mouth daily.    Marland Kitchen aspirin 81 MG tablet Take 81 mg by mouth daily.      Marland Kitchen atorvastatin (LIPITOR) 40 MG tablet TAKE 1 TABLET BY MOUTH DAILY. 90 tablet 2  . cetirizine (ZYRTEC) 10 MG tablet Take 10 mg by mouth daily.    . Coenzyme Q10 (CO Q 10 PO) Take 300 mg by mouth daily.    . cyanocobalamin 2000 MCG tablet Take 2,500 mcg by mouth daily.     Marland Kitchen lisinopril-hydrochlorothiazide (PRINZIDE,ZESTORETIC) 20-25 MG tablet TAKE 1 TABLET BY MOUTH DAILY. 90 tablet 1  . metoprolol (LOPRESSOR) 100 MG tablet TAKE 1 TABLET BY MOUTH 2 TIMES DAILY. 180 tablet 1  . pantoprazole (PROTONIX) 40 MG tablet TAKE 1 TABLET (40 MG TOTAL) BY MOUTH DAILY. 90 tablet 1   No current facility-administered medications on file prior to visit.    BP 120/70 mmHg  Pulse 68  Temp(Src) 97.8 F (36.6 C) (Oral)  Resp 20  Ht 5\' 5"  (1.651 m)  Wt 183 lb (83.008 kg)  BMI 30.45 kg/m2  SpO2 95%     Review of Systems  Constitutional: Negative.  HENT: Negative for congestion, dental problem, hearing loss, rhinorrhea, sinus pressure, sore throat and tinnitus.   Eyes: Negative for pain, discharge and visual disturbance.  Respiratory: Negative for cough and shortness of breath.   Cardiovascular: Negative for chest pain, palpitations and leg swelling.  Gastrointestinal: Negative for nausea, vomiting, abdominal pain, diarrhea, constipation, blood in stool and abdominal distention.  Genitourinary: Negative for dysuria, urgency, frequency, hematuria, flank pain, vaginal bleeding, vaginal discharge, difficulty urinating, vaginal pain and pelvic pain.  Musculoskeletal: Positive for arthralgias. Negative for joint swelling and gait problem.  Skin: Negative for rash.  Neurological: Negative for dizziness, syncope, speech difficulty, weakness, numbness and headaches.  Hematological: Negative for adenopathy.  Psychiatric/Behavioral: Negative for behavioral problems, dysphoric mood and agitation. The patient is not nervous/anxious.          Objective:   Physical Exam  Constitutional: She appears well-developed and well-nourished. No distress.  Musculoskeletal:   Aspect of the right foot, especially near the fifth MTP joint Slight tenderness along the dorsal aspect of the foot  No obvious inflammatory changes other than the discomfort          Assessment & Plan:   History of gouty arthritis left great toe.  3 months ago. Right foot pain.  Possible subacute gouty arthritis.  Will place on a low purine diet and observed.  The patient had a nice prompt response to oral prednisone.  Will treat for 5 days.  We'll consider treatment with allopurinol if dietary factors are not effective and patient has recurrent gout

## 2016-04-05 ENCOUNTER — Other Ambulatory Visit (INDEPENDENT_AMBULATORY_CARE_PROVIDER_SITE_OTHER): Payer: Medicare Other | Admitting: *Deleted

## 2016-04-05 DIAGNOSIS — E785 Hyperlipidemia, unspecified: Secondary | ICD-10-CM | POA: Diagnosis not present

## 2016-04-05 LAB — LIPID PANEL
Cholesterol: 190 mg/dL (ref 125–200)
HDL: 41 mg/dL — ABNORMAL LOW (ref >=46)
LDL Cholesterol: 119 mg/dL (ref <130)
Total CHOL/HDL Ratio: 4.6 Ratio (ref <=5.0)
Triglycerides: 150 mg/dL — ABNORMAL HIGH (ref <150)
VLDL: 30 mg/dL (ref <30)

## 2016-04-05 LAB — HEPATIC FUNCTION PANEL
ALT: 48 U/L — ABNORMAL HIGH (ref 6–29)
AST: 43 U/L — ABNORMAL HIGH (ref 10–35)
Albumin: 3.9 g/dL (ref 3.6–5.1)
Alkaline Phosphatase: 89 U/L (ref 33–130)
Bilirubin, Direct: 0.1 mg/dL (ref <=0.2)
Indirect Bilirubin: 0.3 mg/dL (ref 0.2–1.2)
Total Bilirubin: 0.4 mg/dL (ref 0.2–1.2)
Total Protein: 8.2 g/dL — ABNORMAL HIGH (ref 6.1–8.1)

## 2016-04-08 ENCOUNTER — Telehealth: Payer: Self-pay | Admitting: Cardiovascular Disease

## 2016-04-08 DIAGNOSIS — R740 Nonspecific elevation of levels of transaminase and lactic acid dehydrogenase [LDH]: Principal | ICD-10-CM

## 2016-04-08 DIAGNOSIS — R7401 Elevation of levels of liver transaminase levels: Secondary | ICD-10-CM

## 2016-04-08 NOTE — Telephone Encounter (Signed)
I spoke with the pt and made her aware of preliminary results of lab work. The pt would like to be contacted on her cell phone with results once Dr Burt Knack has reviewed her chart.

## 2016-04-08 NOTE — Telephone Encounter (Signed)
New message   Pt is calling for her lab results 04-05-16

## 2016-04-18 NOTE — Telephone Encounter (Signed)
Notes Recorded by Sherren Mocha, MD on 04/15/2016 at 6:08 AM Cholesterol not at goal. In past has not been on atorvastatin every day because of side effects. Please check on compliance with daily Rx. Also would check hepatic ultrasound to evaluate low-level transaminase elevation. This is stable. Work on lifestyle modification.    I called the pt's mobile phone but she has a voicemail that has not been set up. I spoke with the pt at home number and made her aware of results.  The pt states that she is compliant with daily Atorvastatin.  I advised her to continue this medication, work on diet and exercise and order placed for ultrasound [facility]). I made her aware that [place] will contact her to arrange appointment.

## 2016-04-28 ENCOUNTER — Ambulatory Visit
Admission: RE | Admit: 2016-04-28 | Discharge: 2016-04-28 | Disposition: A | Discharge status: Home / Self Care | Payer: Medicare Other | Source: Ambulatory Visit | Attending: Cardiovascular Disease | Admitting: Cardiovascular Disease

## 2016-04-28 DIAGNOSIS — R740 Nonspecific elevation of levels of transaminase and lactic acid dehydrogenase [LDH]: Principal | ICD-10-CM

## 2016-04-28 DIAGNOSIS — R7401 Elevation of levels of liver transaminase levels: Secondary | ICD-10-CM

## 2016-05-17 ENCOUNTER — Other Ambulatory Visit: Payer: Self-pay | Admitting: Internal Medicine

## 2016-06-14 ENCOUNTER — Encounter: Payer: Self-pay | Admitting: Internal Medicine

## 2016-06-14 ENCOUNTER — Ambulatory Visit (INDEPENDENT_AMBULATORY_CARE_PROVIDER_SITE_OTHER): Payer: Medicare Other | Admitting: Internal Medicine

## 2016-06-14 VITALS — BP 140/90 | HR 88 | Temp 98.0°F | Resp 22 | Ht 65.0 in | Wt 182.6 lb

## 2016-06-14 DIAGNOSIS — J441 Chronic obstructive pulmonary disease with (acute) exacerbation: Secondary | ICD-10-CM | POA: Diagnosis not present

## 2016-06-14 DIAGNOSIS — I2510 Atherosclerotic heart disease of native coronary artery without angina pectoris: Secondary | ICD-10-CM | POA: Diagnosis not present

## 2016-06-14 DIAGNOSIS — I10 Essential (primary) hypertension: Secondary | ICD-10-CM

## 2016-06-14 DIAGNOSIS — Z720 Tobacco use: Secondary | ICD-10-CM | POA: Diagnosis not present

## 2016-06-14 MED ORDER — PREDNISONE 20 MG PO TABS: 20.0000 mg | ORAL_TABLET | Freq: Two times a day (BID) | ORAL | 0 refills | Status: DC

## 2016-06-14 MED ORDER — DOXYCYCLINE HYCLATE 100 MG PO TABS: 100.0000 mg | ORAL_TABLET | Freq: Two times a day (BID) | ORAL | 0 refills | Status: DC

## 2016-06-14 MED ORDER — HYDROCODONE-ACETAMINOPHEN 5-325 MG PO TABS: ORAL_TABLET | ORAL | 0 refills | Status: DC

## 2016-06-14 NOTE — Patient Instructions (Signed)
Take over-the-counter expectorants and cough medications such as  Mucinex DM.  Call if there is no improvement in 5 to 7 days or if  you develop worsening cough, fever, or new symptoms, such as shortness of breath or chest pain.  Smoking tobacco is very bad for your health. You should stop smoking immediately. 

## 2016-06-14 NOTE — Progress Notes (Signed)
Subjective:    Patient ID: Tracy [name], female    DOB: 21-Jun-1946, 70 y.o.   MRN: [id number]  HPI  70 year old patient who has a history of ongoing tobacco use.  She presents with a four-day history of increasing cough, congestion and shortness of breath.  A grandchild and her husband not have also had an acute upper respiratory tract infection.  Patient denies any wheezing or sputum production.  No fever  Past Medical History:  Diagnosis Date  . Allergic rhinitis   . Headache(784.0)   . Hyperlipidemia   . Hypertension   . Low back pain   . Menopausal syndrome   . Myocardial infarct (HCC)    hx of  . Overactive bladder      Social History   Social History  . Marital status: Married    Spouse name: N/A  . Number of children: N/A  . Years of education: N/A   Occupational History  . 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 Smoking status: Former Smoker    Quit date: 07/01/2012  . Smokeless tobacco: Never Used     Comment: electronic cigarettes  . Alcohol use No  . Drug use: No  . Sexual activity: Not on file   Other Topics Concern  . Not on file   Social History Narrative  . No narrative on file    Past Surgical History:  Procedure Laterality Date  . ANGIOPLASTY     stent 2007  . TONSILLECTOMY      Family History  Problem Relation Age of Onset  . Adopted: Yes  . Other      patient is adopted    Allergies  Allergen Reactions  . Erythromycin   . Sulfamethoxazole     REACTION: unspecified    Current Outpatient Prescriptions on File Prior to Visit  Medication Sig Dispense Refill  . ALPRAZolam (XANAX) 0.25 MG tablet TAKE 1 TABLET BY MOUTH TWICE A DAY AS NEEDED FOR ANXIETY 60 tablet 2  . Ascorbic Acid (VITAMIN C) 1000 MG tablet Take 1,000 mg by mouth daily.    Marland Kitchen aspirin 81 MG tablet Take 81 mg by mouth daily.      Marland Kitchen atorvastatin (LIPITOR) 40 MG tablet TAKE 1 TABLET BY MOUTH DAILY. 90 tablet 2  . cetirizine (ZYRTEC) 10  MG tablet Take 10 mg by mouth daily.    . Coenzyme Q10 (CO Q 10 PO) Take 300 mg by mouth daily.    . cyanocobalamin 2000 MCG tablet Take 2,500 mcg by mouth daily.     Marland Kitchen lisinopril-hydrochlorothiazide (PRINZIDE,ZESTORETIC) 20-25 MG tablet TAKE 1 TABLET BY MOUTH DAILY. 90 tablet 0  . metoprolol (LOPRESSOR) 100 MG tablet TAKE 1 TABLET BY MOUTH 2 TIMES DAILY. 180 tablet 0  . nitroGLYCERIN (NITROSTAT) 0.4 MG SL tablet Place 1 tablet (0.4 mg total) under the tongue every 5 (five) minutes as needed. 15 tablet 6  . pantoprazole (PROTONIX) 40 MG tablet TAKE 1 TABLET (40 MG TOTAL) BY MOUTH DAILY. 90 tablet 0  . predniSONE (DELTASONE) 20 MG tablet Take 1 tablet (20 mg total) by mouth 2 (two) times daily with a meal. (Patient not taking: Reported on 06/14/2016) 20 tablet 0   No current facility-administered medications on file prior to visit.     BP 140/90 (BP Location: Right Arm, Patient Position: Sitting, Cuff Size: Normal)   Pulse 88   Temp 98 F (36.7 C) (Oral)   Resp (!) 22  Ht 5\' 5"  (1.651 m)   Wt 182 lb 9.6 oz (82.8 kg)   SpO2 93%   BMI 30.39 kg/m     Review of Systems  Constitutional: Positive for activity change and appetite change. Negative for fever.  HENT: Negative for congestion, dental problem, hearing loss, rhinorrhea, sinus pressure, sore throat and tinnitus.   Eyes: Negative for pain, discharge and visual disturbance.  Respiratory: Positive for cough and shortness of breath.   Cardiovascular: Negative for chest pain, palpitations and leg swelling.  Gastrointestinal: Negative for abdominal distention, abdominal pain, blood in stool, constipation, diarrhea, nausea and vomiting.  Genitourinary: Negative for difficulty urinating, dysuria, flank pain, frequency, hematuria, pelvic pain, urgency, vaginal bleeding, vaginal discharge and vaginal pain.  Musculoskeletal: Negative for arthralgias, gait problem and joint swelling.  Skin: Negative for rash.  Neurological: Negative for  dizziness, syncope, speech difficulty, weakness, numbness and headaches.  Hematological: Negative for adenopathy.  Psychiatric/Behavioral: Negative for agitation, behavioral problems and dysphoric mood. The patient is not nervous/anxious.        Objective:   Physical Exam  Constitutional: She is oriented to person, place, and time. She appears well-developed and well-nourished.  HENT:  Head: Normocephalic.  Right Ear: External ear normal.  Left Ear: External ear normal.  Mouth/Throat: Oropharynx is clear and moist.  Eyes: Conjunctivae and EOM are normal. Pupils are equal, round, and reactive to light.  Neck: Normal range of motion. Neck supple. No thyromegaly present.  Cardiovascular: Normal rate, regular rhythm, normal heart sounds and intact distal pulses.   Pulmonary/Chest: Breath sounds normal. No respiratory distress. She has no wheezes. She has no rales.  O2 saturation 93%  Abdominal: Soft. Bowel sounds are normal. She exhibits no mass. There is no tenderness.  Musculoskeletal: Normal range of motion.  Lymphadenopathy:    She has no cervical adenopathy.  Neurological: She is alert and oriented to person, place, and time.  Skin: Skin is warm and dry. No rash noted.  Psychiatric: She has a normal mood and affect. Her behavior is normal.          Assessment & Plan:   COPD exacerbation Ongoing tobacco use.  Total smoking cessation encouraged  We'll treat with oral steroids, azithromycin and expectorants.  Will call if unimproved  Nyoka Cowden

## 2016-06-14 NOTE — Progress Notes (Signed)
Pre visit review using our clinic review tool, if applicable. No additional management support is needed unless otherwise documented below in the visit note. 

## 2016-07-03 ENCOUNTER — Other Ambulatory Visit: Payer: Self-pay | Admitting: Internal Medicine

## 2016-07-05 ENCOUNTER — Telehealth: Payer: Self-pay | Admitting: Internal Medicine

## 2016-07-05 MED ORDER — PREDNISONE 20 MG PO TABS: 20.0000 mg | ORAL_TABLET | Freq: Two times a day (BID) | ORAL | 0 refills | Status: DC

## 2016-07-05 NOTE — Telephone Encounter (Signed)
Prednisone 20 mg #14 one twice a day

## 2016-07-05 NOTE — Telephone Encounter (Signed)
Please see message and advise if refill okay? 

## 2016-07-05 NOTE — Telephone Encounter (Signed)
Spoke to pt, told her Rx was sent to pharmacy for Prednisone. Pt verbalized understanding.

## 2016-07-05 NOTE — Telephone Encounter (Signed)
Pt request refill  predniSONE (DELTASONE) 20 MG tablet  Pt states the gout in her foot has returned, however, the pharmacy gave her 6 tabs until this  refill could have been done. Now this refill was refused. Please advise

## 2016-07-11 DIAGNOSIS — Z23 Encounter for immunization: Secondary | ICD-10-CM | POA: Diagnosis not present

## 2016-08-07 ENCOUNTER — Other Ambulatory Visit: Payer: Self-pay | Admitting: Cardiovascular Disease

## 2016-08-09 ENCOUNTER — Ambulatory Visit (INDEPENDENT_AMBULATORY_CARE_PROVIDER_SITE_OTHER): Payer: Medicare Other | Admitting: Internal Medicine

## 2016-08-09 ENCOUNTER — Encounter: Payer: Self-pay | Admitting: Internal Medicine

## 2016-08-09 VITALS — BP 120/80 | HR 83 | Temp 98.0°F | Resp 20 | Ht 65.0 in | Wt 187.0 lb

## 2016-08-09 DIAGNOSIS — M109 Gout, unspecified: Secondary | ICD-10-CM

## 2016-08-09 DIAGNOSIS — I2510 Atherosclerotic heart disease of native coronary artery without angina pectoris: Secondary | ICD-10-CM | POA: Diagnosis not present

## 2016-08-09 DIAGNOSIS — E790 Hyperuricemia without signs of inflammatory arthritis and tophaceous disease: Secondary | ICD-10-CM | POA: Diagnosis not present

## 2016-08-09 MED ORDER — HYDROCODONE-ACETAMINOPHEN 5-325 MG PO TABS: ORAL_TABLET | ORAL | 0 refills | Status: DC

## 2016-08-09 MED ORDER — PANTOPRAZOLE SODIUM 40 MG PO TBEC: DELAYED_RELEASE_TABLET | ORAL | 1 refills | Status: DC

## 2016-08-09 MED ORDER — ALLOPURINOL 100 MG PO TABS: 100.0000 mg | ORAL_TABLET | Freq: Every day | ORAL | 6 refills | Status: DC

## 2016-08-09 MED ORDER — LOSARTAN POTASSIUM 100 MG PO TABS: 100.0000 mg | ORAL_TABLET | Freq: Every day | ORAL | 3 refills | Status: DC

## 2016-08-09 MED ORDER — ALPRAZOLAM 0.25 MG PO TABS: ORAL_TABLET | ORAL | 5 refills | Status: DC

## 2016-08-09 MED ORDER — METOPROLOL TARTRATE 100 MG PO TABS: 100.0000 mg | ORAL_TABLET | Freq: Two times a day (BID) | ORAL | 1 refills | Status: DC

## 2016-08-09 MED ORDER — LISINOPRIL-HYDROCHLOROTHIAZIDE 20-25 MG PO TABS: 1.0000 | ORAL_TABLET | Freq: Every day | ORAL | 1 refills | Status: DC

## 2016-08-09 MED ORDER — PREDNISONE 20 MG PO TABS: 20.0000 mg | ORAL_TABLET | Freq: Two times a day (BID) | ORAL | 0 refills | Status: DC

## 2016-08-09 NOTE — Patient Instructions (Addendum)
Discontinue lisinopril hydrochlorothiazide  Start losartan 100 mg daily  In 3 weeks, start allopurinol 100 mg daily  Prednisone 20 mg twice daily for 6 days  Please adhere to a low purine diet Gout Gout is when your joints become red, sore, and swell (inflamed). This is caused by the buildup of uric acid crystals in the joints. Uric acid is a chemical that is normally in the blood. If the level of uric acid gets too high in the blood, these crystals form in your joints and tissues. Over time, these crystals can form into masses near the joints and tissues. These masses can destroy bone and cause the bone to look misshapen (deformed). HOME CARE   Do not take aspirin for pain.  Only take medicine as told by your doctor.  Rest the joint as much as you can. When in bed, keep sheets and blankets off painful areas.  Keep the sore joints raised (elevated).  Put warm or cold packs on painful joints. Use of warm or cold packs depends on which works best for you.  Use crutches if the painful joint is in your leg.  Drink enough fluids to keep your pee (urine) clear or pale yellow. Limit alcohol, sugary drinks, and drinks with fructose in them.  Follow your diet instructions. Pay careful attention to how much protein you eat. Include fruits, vegetables, whole grains, and fat-free or low-fat milk products in your daily diet. Talk to your doctor or dietitian about the use of coffee, vitamin C, and cherries. These may help lower uric acid levels.  Keep a healthy body weight. GET HELP RIGHT AWAY IF:   You have watery poop (diarrhea), throw up (vomit), or have any side effects from medicines.  You do not feel better in 24 hours, or you are getting worse.  Your joint becomes suddenly more tender, and you have chills or a fever. MAKE SURE YOU:   Understand these instructions.  Will watch your condition.  Will get help right away if you are not doing well or get worse.   This information is  not intended to replace advice given to you by your health care provider. Make sure you discuss any questions you have with your health care provider.   Document Released: 07/12/2008 Document Revised: 10/24/2014 Document Reviewed: 05/16/2012 Elsevier Interactive Patient Education 2016 [employer] are compounds that affect the level of uric acid in your body. A low-purine diet is a diet that is low in purines. Eating a low-purine diet can prevent the level of uric acid in your body from getting too high and causing gout or kidney stones or both. WHAT DO I NEED TO KNOW ABOUT THIS DIET?  Choose low-purine foods. Examples of low-purine foods are listed in the next section.  Drink plenty of fluids, especially water. Fluids can help remove uric acid from your body. Try to drink 8-16 cups (1.9-3.8 L) a day.  Limit foods high in fat, especially saturated fat, as fat makes it harder for the body to get rid of uric acid. Foods high in saturated fat include pizza, cheese, ice cream, whole milk, fried foods, and gravies. Choose foods that are lower in fat and lean sources of protein. Use olive oil when cooking as it contains healthy fats that are not high in saturated fat.  Limit alcohol. Alcohol interferes with the elimination of uric acid from your body. If you are having a gout attack, avoid all alcohol.  Keep in mind that  different people's bodies react differently to different foods. You will probably learn over time which foods do or do not affect you. If you discover that a food tends to cause your gout to flare up, avoid eating that food. You can more freely enjoy foods that do not cause problems. If you have any questions about a food item, talk to your dietitian or health care provider. WHICH FOODS ARE LOW, MODERATE, AND HIGH IN PURINES? The following is a list of foods that are low, moderate, and high in purines. You can eat any amount of the foods that are low in purines.  You may be able to have small amounts of foods that are moderate in purines. Ask your health care provider how much of a food moderate in purines you can have. Avoid foods high in purines. Grains  Foods low in purines: Enriched white bread, pasta, rice, cake, cornbread, popcorn.  Foods moderate in purines: Whole-grain breads and cereals, wheat germ, bran, oatmeal. Uncooked oatmeal. Dry wheat bran or wheat germ.  Foods high in purines: Pancakes, [place] toast, biscuits, muffins. Vegetables  Foods low in purines: All vegetables, except those that are moderate in purines.  Foods moderate in purines: Asparagus, cauliflower, spinach, mushrooms, green peas. Fruits  All fruits are low in purines. Meats and other Protein Foods  Foods low in purines: Eggs, nuts, peanut butter.  Foods moderate in purines: 80-90% lean beef, lamb, veal, pork, poultry, fish, eggs, peanut butter, nuts. Crab, lobster, oysters, and shrimp. Cooked dried beans, peas, and lentils.  Foods high in purines: Anchovies, sardines, herring, mussels, tuna, codfish, scallops, trout, and haddock. Bernie[place] Salines. Organ meats (such as liver or kidney). Tripe. Game meat. Goose. Sweetbreads. Dairy  All dairy foods are low in purines. Low-fat and fat-free dairy products are best because they are low in saturated fat. Beverages  Drinks low in purines: Water, carbonated beverages, tea, coffee, cocoa.  Drinks moderate in purines: Soft drinks and other drinks sweetened with high-fructose corn syrup. Juices. To find whether a food or drink is sweetened with high-fructose corn syrup, look at the ingredients list.  Drinks high in purines: Alcoholic beverages (such as beer). Condiments  Foods low in purines: Salt, herbs, olives, pickles, relishes, vinegar.  Foods moderate in purines: Butter, margarine, oils, mayonnaise. Fats and Oils  Foods low in purines: All types, except gravies and sauces made with meat.  Foods high in purines: Gravies  and sauces made with meat. Other Foods  Foods low in purines: Sugars, sweets, gelatin. Cake. Soups made without meat.  Foods moderate in purines: Meat-based or fish-based soups, broths, or bouillons. Foods and drinks sweetened with high-fructose corn syrup.  Foods high in purines: High-fat desserts (such as ice cream, cookies, cakes, pies, doughnuts, and chocolate). Contact your dietitian for more information on foods that are not listed here.   This information is not intended to replace advice given to you by your health care provider. Make sure you discuss any questions you have with your health care provider.   Document Released: 01/28/2011 Document Revised: 10/08/2013 Document Reviewed: 09/09/2013 Elsevier Interactive Patient Education [employer].

## 2016-08-09 NOTE — Progress Notes (Signed)
Subjective:    Patient ID: Tracy [name], female    DOB: [birth year], 70 y.o.   MRN: [id number]  HPI  70 year old patient who was diagnosed with suspected gout in March of this year.  She presented with acute pain and swelling involving her left first MTP joint.  Hyperuricemia, confirmed at that time.  She responded well to prednisone therapy Since this time.  She has had 2 or 3 additional episodes of gouty arthritis She has hypertension in medical regimen includes diuretic therapy.  Past Medical History:  Diagnosis Date  . Allergic rhinitis   . Headache(784.0)   . Hyperlipidemia   . Hypertension   . Low back pain   . Menopausal syndrome   . Myocardial infarct    hx of  . Overactive bladder      Social History   Social History  . Marital status: Married    Spouse name: N/A  . Number of children: N/A  . Years of education: N/A   Occupational History  . 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 Smoking status: Former Smoker    Quit date: 07/01/2012  . Smokeless tobacco: Never Used     Comment: electronic cigarettes  . Alcohol use No  . Drug use: No  . Sexual activity: Not on file   Other Topics Concern  . Not on file   Social History Narrative  . No narrative on file    Past Surgical History:  Procedure Laterality Date  . ANGIOPLASTY     stent 2007  . TONSILLECTOMY      Family History  Problem Relation Age of Onset  . Adopted: Yes  . Other      patient is adopted    Allergies  Allergen Reactions  . Erythromycin   . Sulfamethoxazole     REACTION: unspecified    Current Outpatient Prescriptions on File Prior to Visit  Medication Sig Dispense Refill  . Ascorbic Acid (VITAMIN C) 1000 MG tablet Take 1,000 mg by mouth daily.    Marland Kitchen aspirin 81 MG tablet Take 81 mg by mouth daily.      Marland Kitchen atorvastatin (LIPITOR) 40 MG tablet TAKE 1 TABLET BY MOUTH DAILY. 90 tablet 3  . cetirizine (ZYRTEC) 10 MG tablet Take 10 mg by mouth  daily.    . Coenzyme Q10 (CO Q 10 PO) Take 300 mg by mouth daily.    . cyanocobalamin 2000 MCG tablet Take 2,500 mcg by mouth daily.     . nitroGLYCERIN (NITROSTAT) 0.4 MG SL tablet Place 1 tablet (0.4 mg total) under the tongue every 5 (five) minutes as needed. 15 tablet 6   No current facility-administered medications on file prior to visit.     BP 120/80 (BP Location: Left Arm, Patient Position: Sitting, Cuff Size: Normal)   Pulse 83   Temp 98 F (36.7 C) (Oral)   Resp 20   Ht 5\' 5"  (1.651 m)   Wt 187 lb (84.8 kg)   SpO2 97%   BMI 31.12 kg/m     Review of Systems  Musculoskeletal: Positive for arthralgias, gait problem and joint swelling.       Objective:   Physical Exam  Constitutional: She appears well-developed and well-nourished. No distress.  Musculoskeletal:  Swelling, redness and tenderness involving the left first MTP joint          Assessment & Plan:   Recurrent gouty arthritis. We'll discontinue lisinopril, hydrochlorothiazide and substitute losartan  100 Low purine diet After acute episode resolves.  Will treat with allopurinol 100 mg daily.  Patient has physical scheduled in January.  We'll repeat uric asked to level at that time and consider up titration Prednisone 20 mg twice a day for 7 days  [employer]

## 2016-08-14 ENCOUNTER — Other Ambulatory Visit: Payer: Self-pay | Admitting: Internal Medicine

## 2016-08-23 ENCOUNTER — Encounter: Payer: Self-pay | Admitting: Family Medicine

## 2016-08-23 ENCOUNTER — Ambulatory Visit (INDEPENDENT_AMBULATORY_CARE_PROVIDER_SITE_OTHER): Payer: Medicare Other | Admitting: Family Medicine

## 2016-08-23 VITALS — BP 140/92 | HR 91 | Temp 98.0°F | Ht 65.0 in | Wt 189.9 lb

## 2016-08-23 DIAGNOSIS — I2510 Atherosclerotic heart disease of native coronary artery without angina pectoris: Secondary | ICD-10-CM | POA: Diagnosis not present

## 2016-08-23 DIAGNOSIS — I10 Essential (primary) hypertension: Secondary | ICD-10-CM

## 2016-08-23 MED ORDER — AMLODIPINE BESYLATE 2.5 MG PO TABS: 2.5000 mg | ORAL_TABLET | Freq: Every day | ORAL | 1 refills | Status: DC

## 2016-08-23 NOTE — Patient Instructions (Signed)
BEFORE YOU LEAVE: -follow up: 2 weeks   Start the amlodipine 2.5mg  daily in the morning for the blood pressure.  If you check your blood pressure at home, ensure sitting for 5 minutes, feet flat on the floor and follow proper instructions on the blood pressure cuff. Take average of 3 readings done 3 minutes apart - keep written log to bring to appointment.  Continue your other blood pressure medications.

## 2016-08-23 NOTE — Progress Notes (Signed)
Pre visit review using our clinic review tool, if applicable. No additional management support is needed unless otherwise documented below in the visit note. 

## 2016-08-23 NOTE — Progress Notes (Signed)
HPI:  Tracy [name] is a pleasant 70 yo here for an acute visit for HTN:  HTN: -reports diuretic stopped 2 weeks ago due to gout and has had some elevated BP readings at home since -had reading in 160-180s/90-100 at home this morning - but she had not taken her medications -crrent meds include metop 100bid and losartan100mg  -occ mild HA (but has hx headaches) -no new CP, SOB, DOE, swelling, vision changes -she brought her cuff today ROS: See pertinent positives and negatives per HPI.  Past Medical History:  Diagnosis Date  . Allergic rhinitis   . Headache(784.0)   . Hyperlipidemia   . Hypertension   . Low back pain   . Menopausal syndrome   . Myocardial infarct    hx of  . Overactive bladder     Past Surgical History:  Procedure Laterality Date  . ANGIOPLASTY     stent 2007  . TONSILLECTOMY      Family History  Problem Relation Age of Onset  . Adopted: Yes  . Other      patient is adopted    Social History   Social History  . Marital status: Married    Spouse name: N/A  . Number of children: N/A  . Years of education: N/A   Occupational History  . 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 Smoking status: Former Smoker    Quit date: 07/01/2012  . Smokeless tobacco: Never Used     Comment: electronic cigarettes  . Alcohol use No  . Drug use: No  . Sexual activity: Not Asked   Other Topics Concern  . None   Social History Narrative  . None     Current Outpatient Prescriptions:  .  allopurinol (ZYLOPRIM) 100 MG tablet, Take 1 tablet (100 mg total) by mouth daily., Disp: 30 tablet, Rfl: 6 .  ALPRAZolam (XANAX) 0.25 MG tablet, TAKE 1 TABLET BY MOUTH TWICE A DAY AS NEEDED FOR ANXIETY, Disp: 60 tablet, Rfl: 5 .  Ascorbic Acid (VITAMIN C) 1000 MG tablet, Take 1,000 mg by mouth daily., Disp: , Rfl:  .  aspirin 81 MG tablet, Take 81 mg by mouth daily.  , Disp: , Rfl:  .  atorvastatin (LIPITOR) 40 MG tablet, TAKE 1 TABLET BY  MOUTH DAILY., Disp: 90 tablet, Rfl: 3 .  cetirizine (ZYRTEC) 10 MG tablet, Take 10 mg by mouth daily., Disp: , Rfl:  .  Coenzyme Q10 (CO Q 10 PO), Take 300 mg by mouth daily., Disp: , Rfl:  .  cyanocobalamin 2000 MCG tablet, Take 2,500 mcg by mouth daily. , Disp: , Rfl:  .  HYDROcodone-acetaminophen (NORCO/VICODIN) 5-325 MG tablet, TAKE 1 TABLET EVERY 6 HOURS AS NEEDED FOR PAIN, Disp: 60 tablet, Rfl: 0 .  lisinopril-hydrochlorothiazide (PRINZIDE,ZESTORETIC) 20-25 MG tablet, TAKE 1 TABLET BY MOUTH DAILY., Disp: 90 tablet, Rfl: 1 .  losartan (COZAAR) 100 MG tablet, Take 1 tablet (100 mg total) by mouth daily., Disp: 90 tablet, Rfl: 3 .  metoprolol (LOPRESSOR) 100 MG tablet, Take 1 tablet (100 mg total) by mouth 2 (two) times daily., Disp: 180 tablet, Rfl: 1 .  Misc Natural Products (TART CHERRY ADVANCED) CAPS, Take 1 capsule by mouth daily., Disp: , Rfl:  .  nitroGLYCERIN (NITROSTAT) 0.4 MG SL tablet, Place 1 tablet (0.4 mg total) under the tongue every 5 (five) minutes as needed., Disp: 15 tablet, Rfl: 6 .  pantoprazole (PROTONIX) 40 MG tablet, TAKE 1 TABLET (40 MG  TOTAL) BY MOUTH DAILY., Disp: 90 tablet, Rfl: 1 .  amLODipine (NORVASC) 2.5 MG tablet, Take 1 tablet (2.5 mg total) by mouth daily., Disp: 30 tablet, Rfl: 1  EXAM:  Vitals:   08/23/16 1014  BP: (!) 140/92  Pulse: 91  Temp: 98 F (36.7 C)    Body mass index is 31.6 kg/m.  GENERAL: vitals reviewed and listed above, alert, oriented, appears well hydrated and in no acute distress  HEENT: atraumatic, conjunttiva clear, no obvious abnormalities on inspection of external nose and ears  NECK: no obvious masses on inspection  LUNGS: clear to auscultation bilaterally, no wheezes, rales or rhonchi, good air movement  CV: HRRR, tr ankle edema  MS: moves all extremities without noticeable abnormality  PSYCH: pleasant and cooperative, no obvious depression or anxiety  ASSESSMENT AND PLAN:  Discussed the following assessment and  plan:  Essential hypertension  -she is very anxious about her BP -she did not know how to place her cuff properly - cuff working/calibrated -recheck here elevated a little -demonstrated proper use and proper way to monitor at home - also advised checking when medications in system -discussed antihypertensive options/risks and she decided to try adding low dose amlodipine - rx sent -follow up in 2 weeks -also advised avoidance of nsaids if possible and healthy lifestyle -Patient advised to return or notify a doctor immediately if symptoms worsen or persist or new concerns arise.  Patient Instructions  BEFORE YOU LEAVE: -follow up: 2 weeks   Start the amlodipine 2.5mg  daily in the morning for the blood pressure.  If you check your blood pressure at home, ensure sitting for 5 minutes, feet flat on the floor and follow proper instructions on the blood pressure cuff. Take average of 3 readings done 3 minutes apart - keep written log to bring to appointment.  Continue your other blood pressure medications.    Colin Benton R., DO

## 2016-09-06 ENCOUNTER — Ambulatory Visit (INDEPENDENT_AMBULATORY_CARE_PROVIDER_SITE_OTHER): Payer: Medicare Other | Admitting: Internal Medicine

## 2016-09-06 ENCOUNTER — Encounter: Payer: Self-pay | Admitting: Internal Medicine

## 2016-09-06 VITALS — BP 120/70 | HR 83 | Temp 98.8°F | Ht 66.0 in | Wt 189.4 lb

## 2016-09-06 DIAGNOSIS — I498 Other specified cardiac arrhythmias: Secondary | ICD-10-CM

## 2016-09-06 DIAGNOSIS — I10 Essential (primary) hypertension: Secondary | ICD-10-CM | POA: Diagnosis not present

## 2016-09-06 DIAGNOSIS — E7800 Pure hypercholesterolemia, unspecified: Secondary | ICD-10-CM

## 2016-09-06 DIAGNOSIS — R600 Localized edema: Secondary | ICD-10-CM

## 2016-09-06 DIAGNOSIS — I2510 Atherosclerotic heart disease of native coronary artery without angina pectoris: Secondary | ICD-10-CM | POA: Diagnosis not present

## 2016-09-06 MED ORDER — FUROSEMIDE 20 MG PO TABS: 20.0000 mg | ORAL_TABLET | Freq: Every day | ORAL | 3 refills | Status: DC

## 2016-09-06 NOTE — Progress Notes (Signed)
Subjective:    Patient ID: Tracy [name], female    DOB: 06/15/1946, 70 y.o.   MRN: [id number]  HPI  70 year old patient who has a history of essential hypertension and gout.  She was seen a few weeks ago with headaches and uncontrolled hypertension and amlodipine 2.5 milligrams daily was added to her regimen.  She has developed pedal edema and worsening pain involving her feet and ankles. Previously due to hyperuricemia and gout hydrochlorothiazide had been discontinued and losartan substituted for lisinopril.  She has a history of coronary artery disease and also is on metoprolol Her headaches have resolved and her blood pressure has been much better controlled  Past Medical History:  Diagnosis Date  . Allergic rhinitis   . Headache(784.0)   . Hyperlipidemia   . Hypertension   . Low back pain   . Menopausal syndrome   . Myocardial infarct    hx of  . Overactive bladder      Social History   Social History  . Marital status: Married    Spouse name: N/A  . Number of children: N/A  . Years of education: N/A   Occupational History  . 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 Smoking status: Former Smoker    Quit date: 07/01/2012  . Smokeless tobacco: Never Used     Comment: electronic cigarettes  . Alcohol use No  . Drug use: No  . Sexual activity: Not on file   Other Topics Concern  . Not on file   Social History Narrative  . No narrative on file    Past Surgical History:  Procedure Laterality Date  . ANGIOPLASTY     stent 2007  . TONSILLECTOMY      Family History  Problem Relation Age of Onset  . Adopted: Yes  . Other      patient is adopted    Allergies  Allergen Reactions  . Erythromycin   . Sulfamethoxazole     REACTION: unspecified    Current Outpatient Prescriptions on File Prior to Visit  Medication Sig Dispense Refill  . allopurinol (ZYLOPRIM) 100 MG tablet Take 1 tablet (100 mg total) by mouth daily. 30  tablet 6  . ALPRAZolam (XANAX) 0.25 MG tablet TAKE 1 TABLET BY MOUTH TWICE A DAY AS NEEDED FOR ANXIETY 60 tablet 5  . amLODipine (NORVASC) 2.5 MG tablet Take 1 tablet (2.5 mg total) by mouth daily. 30 tablet 1  . Ascorbic Acid (VITAMIN C) 1000 MG tablet Take 1,000 mg by mouth daily.    Marland Kitchen aspirin 81 MG tablet Take 81 mg by mouth daily.      Marland Kitchen atorvastatin (LIPITOR) 40 MG tablet TAKE 1 TABLET BY MOUTH DAILY. 90 tablet 3  . cetirizine (ZYRTEC) 10 MG tablet Take 10 mg by mouth daily.    . Coenzyme Q10 (CO Q 10 PO) Take 300 mg by mouth daily.    Marland Kitchen HYDROcodone-acetaminophen (NORCO/VICODIN) 5-325 MG tablet TAKE 1 TABLET EVERY 6 HOURS AS NEEDED FOR PAIN 60 tablet 0  . losartan (COZAAR) 100 MG tablet Take 1 tablet (100 mg total) by mouth daily. 90 tablet 3  . metoprolol (LOPRESSOR) 100 MG tablet Take 1 tablet (100 mg total) by mouth 2 (two) times daily. 180 tablet 1  . Misc Natural Products (TART CHERRY ADVANCED) CAPS Take 1 capsule by mouth daily.    . nitroGLYCERIN (NITROSTAT) 0.4 MG SL tablet Place 1 tablet (0.4 mg total) under the tongue  every 5 (five) minutes as needed. 15 tablet 6  . pantoprazole (PROTONIX) 40 MG tablet TAKE 1 TABLET (40 MG TOTAL) BY MOUTH DAILY. 90 tablet 1   No current facility-administered medications on file prior to visit.     BP 120/70 (BP Location: Left Arm, Patient Position: Sitting, Cuff Size: Normal)   Pulse 83   Temp 98.8 F (37.1 C) (Oral)   Ht 5\' 6"  (1.676 m)   Wt 189 lb 6.1 oz (85.9 kg)   SpO2 95%   BMI 30.57 kg/m     Review of Systems  Constitutional: Negative.   HENT: Negative for congestion, dental problem, hearing loss, rhinorrhea, sinus pressure, sore throat and tinnitus.   Eyes: Negative for pain, discharge and visual disturbance.  Respiratory: Negative for cough and shortness of breath.   Cardiovascular: Positive for leg swelling. Negative for chest pain and palpitations.  Gastrointestinal: Negative for abdominal distention, abdominal pain, blood  in stool, constipation, diarrhea, nausea and vomiting.  Genitourinary: Negative for difficulty urinating, dysuria, flank pain, frequency, hematuria, pelvic pain, urgency, vaginal bleeding, vaginal discharge and vaginal pain.  Musculoskeletal: Positive for arthralgias. Negative for gait problem and joint swelling.  Skin: Negative for rash.  Neurological: Negative for dizziness, syncope, speech difficulty, weakness, numbness and headaches.  Hematological: Negative for adenopathy.  Psychiatric/Behavioral: Negative for agitation, behavioral problems and dysphoric mood. The patient is not nervous/anxious.        Objective:   Physical Exam  Constitutional: She is oriented to person, place, and time. She appears well-developed and well-nourished.  Blood pressure 120/70  HENT:  Head: Normocephalic.  Right Ear: External ear normal.  Left Ear: External ear normal.  Mouth/Throat: Oropharynx is clear and moist.  Eyes: Conjunctivae and EOM are normal. Pupils are equal, round, and reactive to light.  Neck: Normal range of motion. Neck supple. No thyromegaly present.  Cardiovascular: Normal rate, normal heart sounds and intact distal pulses.   Frequent ectopics  Pulmonary/Chest: Effort normal.  A few crackles right base only  Abdominal: Soft. Bowel sounds are normal. She exhibits no mass. There is no tenderness.  Musculoskeletal: Normal range of motion. She exhibits edema.  Plus 1 ankle and pedal edema  Lymphadenopathy:    She has no cervical adenopathy.  Neurological: She is alert and oriented to person, place, and time.  Skin: Skin is warm and dry. No rash noted.  Psychiatric: She has a normal mood and affect. Her behavior is normal.          Assessment & Plan:   Symptomatic pedal edema.  Will attempt to further moderate salt intake.  Aggravated by discontinuation of hydrochlorothiazide and addition of amlodipine.  Will treat with low-dose when necessary furosemide Cardiac dysrhythmia.   Will check EKG to rule out atrial fib Dyslipidemia.  Patient has concerns about statin therapy.  She was encouraged to discontinue by a daughter who is a NP.  She will discuss with cardiology next visit.  She was encouraged to continue statin therapy.  Nyoka Cowden

## 2016-09-06 NOTE — Patient Instructions (Signed)
Limit your sodium (Salt) intake  Keep your legs elevated as much as possible  Furosemide 20 mg daily as needed to control swelling  Please check your blood pressure on a regular basis.  If it is consistently greater than 150/90, please make an office appointment.  Return in one month for follow-up

## 2016-09-06 NOTE — Progress Notes (Signed)
Pre visit review using our clinic review tool, if applicable. No additional management support is needed unless otherwise documented below in the visit note. 

## 2016-09-12 ENCOUNTER — Encounter: Payer: Self-pay | Admitting: Physician Assistant

## 2016-09-12 ENCOUNTER — Ambulatory Visit (INDEPENDENT_AMBULATORY_CARE_PROVIDER_SITE_OTHER): Payer: Medicare Other | Admitting: Physician Assistant

## 2016-09-12 VITALS — BP 142/88 | HR 85 | Ht 65.0 in | Wt 185.4 lb

## 2016-09-12 DIAGNOSIS — I10 Essential (primary) hypertension: Secondary | ICD-10-CM

## 2016-09-12 DIAGNOSIS — I340 Nonrheumatic mitral (valve) insufficiency: Secondary | ICD-10-CM | POA: Diagnosis not present

## 2016-09-12 DIAGNOSIS — I2510 Atherosclerotic heart disease of native coronary artery without angina pectoris: Secondary | ICD-10-CM

## 2016-09-12 DIAGNOSIS — I5032 Chronic diastolic (congestive) heart failure: Secondary | ICD-10-CM | POA: Diagnosis not present

## 2016-09-12 DIAGNOSIS — E7800 Pure hypercholesterolemia, unspecified: Secondary | ICD-10-CM

## 2016-09-12 NOTE — Progress Notes (Signed)
Cardiology Office Note    Date:  09/12/2016   ID:  Tracy [name], DOB [birth year], MRN [id number]  PCP:  Nyoka Cowden, MD  Cardiologist: Dr. Burt Knack  Chief Complaint  Patient presents with  . Follow-up    History of Present Illness:  Tracy [name] is a 70 y.o. female  with history of CAD status post remote MI and PCI with BMS Cfx in 2007. Last cath in 2008 mildly reduced LV systolic function EF [id number], mild MR, patent stent in the circumflex and minor nonobstructive disease elsewhere. Echo in 2010 showed normal LV systolic function and mild MR. Saw Dr. Harrington Challenger and Dr. Antionette Char absence 02/19/16 for chest pain at rest that was felt to be musculoskeletal. No further testing recommended. Patient also has history of hypertension., HLD on statin. Cholesterol is not at goal in June of this year but patient was not taking atorvastatin as prescribed. Dr. Burt Knack recommended a hepatic ultrasound to evaluate low level transaminase elevation that's been stable. US showed steatosis otherwise normal.  Comes today for follow up. Has had 6 gout attacks since March. On/off steroids.HCTZ discontinued and BP up recently.losartan substituted for lisinopril. amplodipine added. Lasix 20 mg prn given For lower extremity edema. She's taken it 3 times in the past week. She takes Lipitor mostly every day but misses occasionally because she forgets.    Past Medical History:  Diagnosis Date  . Allergic rhinitis   . Headache(784.0)   . Hyperlipidemia   . Hypertension   . Low back pain   . Menopausal syndrome   . Myocardial infarct    hx of  . Overactive bladder     Past Surgical History:  Procedure Laterality Date  . ANGIOPLASTY     stent 2007  . TONSILLECTOMY      Current Medications: Outpatient Medications Prior to Visit  Medication Sig Dispense Refill  . allopurinol (ZYLOPRIM) 100 MG tablet Take 1 tablet (100 mg total) by mouth daily. 30 tablet 6  . ALPRAZolam (XANAX) 0.25 MG  tablet TAKE 1 TABLET BY MOUTH TWICE A DAY AS NEEDED FOR ANXIETY 60 tablet 5  . amLODipine (NORVASC) 2.5 MG tablet Take 1 tablet (2.5 mg total) by mouth daily. 30 tablet 1  . Ascorbic Acid (VITAMIN C) 1000 MG tablet Take 1,000 mg by mouth daily.    Marland Kitchen aspirin 81 MG tablet Take 81 mg by mouth daily.      Marland Kitchen atorvastatin (LIPITOR) 40 MG tablet TAKE 1 TABLET BY MOUTH DAILY. 90 tablet 3  . cetirizine (ZYRTEC) 10 MG tablet Take 10 mg by mouth daily.    . Coenzyme Q10 (CO Q 10 PO) Take 300 mg by mouth daily.    . Cyanocobalamin 5000 MCG CAPS Take 1 capsule by mouth daily.    . furosemide (LASIX) 20 MG tablet Take 1 tablet (20 mg total) by mouth daily. 30 tablet 3  . HYDROcodone-acetaminophen (NORCO/VICODIN) 5-325 MG tablet TAKE 1 TABLET EVERY 6 HOURS AS NEEDED FOR PAIN 60 tablet 0  . losartan (COZAAR) 100 MG tablet Take 1 tablet (100 mg total) by mouth daily. 90 tablet 3  . metoprolol (LOPRESSOR) 100 MG tablet Take 1 tablet (100 mg total) by mouth 2 (two) times daily. 180 tablet 1  . Misc Natural Products (TART CHERRY ADVANCED) CAPS Take 1 capsule by mouth daily.    . pantoprazole (PROTONIX) 40 MG tablet TAKE 1 TABLET (40 MG TOTAL) BY MOUTH DAILY. 90 tablet 1  . nitroGLYCERIN (NITROSTAT) 0.4 MG SL  tablet Place 1 tablet (0.4 mg total) under the tongue every 5 (five) minutes as needed. (Patient not taking: Reported on 09/12/2016) 15 tablet 6   No facility-administered medications prior to visit.      Allergies:   Sulfamethoxazole and Erythromycin   Social History   Social History  . Marital status: Married    Spouse name: N/A  . Number of children: N/A  . Years of education: N/A   Occupational History  . 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 Smoking status: Former Smoker    Quit date: 07/01/2012  . Smokeless tobacco: Never Used     Comment: electronic cigarettes  . Alcohol use No  . Drug use: No  . Sexual activity: Not Asked   Other Topics Concern  . None    Social History Narrative  . None     Family History:  The patient's family history is not on file. She was adopted.   ROS:   Please see the history of present illness.    Review of Systems  Constitution: Positive for weight gain.  HENT: Negative.   Eyes: Negative.   Cardiovascular: Positive for dyspnea on exertion.  Respiratory: Positive for shortness of breath and wheezing.   Hematologic/Lymphatic: Negative.   Musculoskeletal: Positive for back pain, gout, joint pain, joint swelling and myalgias.  Gastrointestinal: Positive for constipation.  Genitourinary: Negative.   Neurological: Positive for headaches and loss of balance.   All other systems reviewed and are negative.   PHYSICAL EXAM:   VS:  BP (!) 142/88   Pulse 85   Ht 5\' 5"  (1.651 m)   Wt 185 lb 6.4 oz (84.1 kg)   BMI 30.85 kg/m   Physical Exam  GEN: Well nourished, well developed, in no acute distress  Neck: no JVD, carotid bruits, or masses Cardiac:RRR; 2/6 systolic murmur at the apex, no rubs, or gallops  Respiratory:  clear to auscultation bilaterally, normal work of breathing GI: soft, nontender, nondistended, + BS Ext: +1 edema bilaterally without cyanosis, clubbing, Good distal pulses bilaterally MS: no deformity or atrophy  Skin: warm and dry, no rash Psych: euthymic mood, full affect  Wt Readings from Last 3 Encounters:  09/12/16 185 lb 6.4 oz (84.1 kg)  09/06/16 189 lb 6.1 oz (85.9 kg)  08/23/16 189 lb 14.4 oz (86.1 kg)      Studies/Labs Reviewed:   EKG:  EKG is ordered today.  The ekg ordered today demonstrates Normal sinus rhythm with PVCs  Recent Labs: 04/05/2016: ALT 48   Lipid Panel    Component Value Date/Time   CHOL 190 04/05/2016 0742   TRIG 150 (H) 04/05/2016 0742   HDL 41 (L) 04/05/2016 0742   CHOLHDL 4.6 04/05/2016 0742   VLDL 30 04/05/2016 0742   LDLCALC 119 04/05/2016 0742   LDLDIRECT 111.0 01/05/2015 1019    Additional studies/ records that were reviewed today  include:  CLINICAL DATA:  Elevated transaminase.   EXAM: US ABDOMEN LIMITED - RIGHT UPPER QUADRANT   COMPARISON:  None.   FINDINGS: Gallbladder:   No gallstones or gallbladder wall thickening. No pericholecystic fluid. The sonographer reports no sonographic Murphy's sign.   Common bile duct:   Diameter: Upper normal diameter at 6 mm.   Liver:   Coarsened echotexture without focal abnormality evident.   IMPRESSION: Probable steatosis.  Otherwise unremarkable study.     Electronically Signed   By: Misty Stanley M.D.   On: 04/28/2016 09:53  2-D echo 2010 Study Conclusions   - Left ventricle: The cavity size was normal. Wall thickness was    increased in a pattern of mild LVH. Systolic function was normal.    The estimated ejection fraction was in the range of 50% to 55%.    There is hypokinesis of the posterior wall. Doppler parameters are    consistent with abnormal left ventricular relaxation (grade 1    diastolic dysfunction).  - Mitral valve: Calcified annulus. Mild regurgitation.  - Pulmonary arteries: Systolic pressure was mildly increased.  Transthoracic echocardiography. M-mode, complete 2D, spectral  Doppler, and color Doppler. Height: Height: 165.1cm. Height: 65in.  Weight: Weight: 81.6kg. Weight: 179.6lb. Body mass index: BMI:  30kg/m^2. Body surface area: BSA: 1.67m^2. Patient status:  Outpatient. Location: [facility]    ASSESSMENT:    1. Chronic diastolic CHF (congestive heart failure) ([place])   2. Essential hypertension   3. Coronary artery disease involving native coronary artery of native heart without angina pectoris   4. Mitral valve insufficiency, unspecified etiology   5. Pure hypercholesterolemia      PLAN:  In order of problems listed above:  Chronic diastolic heart failure now requiring daily diuretic. We'll recheck 2-D echo since it hasn't been done since 2010 to assess LV function. Recommend compression hose. She can try taking  Lasix every other day but may need to take it daily depending on her edema. Check renal function. Follow-up with Dr. Burt Knack in March  Essential hypertension has been elevated lately. Medications adjusted by primary care. 2 g sodium diet reiterated.  CAD with remote BMS to the circumflex without angina  History of MR we'll reassess with 2-D echo  Hyperlipidemia recheck fasting lipid panel in the morning         Medication Adjustments/Labs and Tests Ordered: Current medicines are reviewed at length with the patient today.  Concerns regarding medicines are outlined above.  Medication changes, Labs and Tests ordered today are listed in the Patient Instructions below. Patient Instructions  Medication Instructions:  Your physician recommends that you continue on your current medications as directed. Please refer to the Current Medication list given to you today.  Labwork: Your physician recommends that you return for lab work tomorrow for [employer], lipid and liver panel. Please be fasting for this lab appointment.   Testing/Procedures: Your physician has requested that you have an echocardiogram. Echocardiography is a painless test that uses sound waves to create images of your heart. It provides your doctor with information about the size and shape of your heart and how well your heart's chambers and valves are working. This procedure takes approximately one hour. There are no restrictions for this procedure.  Follow-Up: Your physician wants you to follow-up in: 6 months with Dr. Burt Knack. You will receive a reminder letter in the mail two months in advance. If you don't receive a letter, please call our office to schedule the follow-up appointment.   If you need a refill on your cardiac medications before your next appointment, please call your pharmacy.   Low-Sodium Eating Plan Sodium raises blood pressure and causes water to be held in the body. Getting less sodium from food will help  lower your blood pressure, reduce any swelling, and protect your heart, liver, and kidneys. We get sodium by adding salt (sodium chloride) to food. Most of our sodium comes from canned, boxed, and frozen foods. Restaurant foods, fast foods, and pizza are also very high in sodium. Even if you take  medicine to lower your blood pressure or to reduce fluid in your body, getting less sodium from your food is important. What is my plan? Most people should limit their sodium intake to 2,300 mg a day. Your health care provider recommends that you limit your sodium intake to __________ a day. What do I need to know about this eating plan? For the low-sodium eating plan, you will follow these general guidelines:  Choose foods with a % Daily Value for sodium of less than 5% (as listed on the food label).  Use salt-free seasonings or herbs instead of table salt or sea salt.  Check with your health care provider or pharmacist before using salt substitutes.  Eat fresh foods.  Eat more vegetables and fruits.  Limit canned vegetables. If you do use them, rinse them well to decrease the sodium.  Limit cheese to 1 oz (28 g) per day.  Eat lower-sodium products, often labeled as "lower sodium" or "no salt added."  Avoid foods that contain monosodium glutamate (MSG). MSG is sometimes added to [place] food and some canned foods.  Check food labels (Nutrition Facts labels) on foods to learn how much sodium is in one serving.  Eat more home-cooked food and less restaurant, buffet, and fast food.  When eating at a restaurant, ask that your food be prepared with less salt, or no salt if possible. How do I read food labels for sodium information? The Nutrition Facts label lists the amount of sodium in one serving of the food. If you eat more than one serving, you must multiply the listed amount of sodium by the number of servings. Food labels may also identify foods as:  Sodium free-Less than 5 mg in a  serving.  Very low sodium-35 mg or less in a serving.  Low sodium-140 mg or less in a serving.  Light in sodium-50% less sodium in a serving. For example, if a food that usually has 300 mg of sodium is changed to become light in sodium, it will have 150 mg of sodium.  Reduced sodium-25% less sodium in a serving. For example, if a food that usually has 400 mg of sodium is changed to reduced sodium, it will have 300 mg of sodium. What foods can I eat? Grains  Low-sodium cereals, including oats, puffed wheat and rice, and shredded wheat cereals. Low-sodium crackers. Unsalted rice and pasta. Lower-sodium bread. Vegetables  Frozen or fresh vegetables. Low-sodium or reduced-sodium canned vegetables. Low-sodium or reduced-sodium tomato sauce and paste. Low-sodium or reduced-sodium tomato and vegetable juices. Fruits  Fresh, frozen, and canned fruit. Fruit juice. Meat and Other Protein Products  Low-sodium canned tuna and salmon. Fresh or frozen meat, poultry, seafood, and fish. Lamb. Unsalted nuts. Dried beans, peas, and lentils without added salt. Unsalted canned beans. Homemade soups without salt. Eggs. Dairy  Milk. Soy milk. Ricotta cheese. Low-sodium or reduced-sodium cheeses. Yogurt. Condiments  Fresh and dried herbs and spices. Salt-free seasonings. Onion and garlic powders. Low-sodium varieties of mustard and ketchup. Fresh or refrigerated horseradish. Lemon juice. Fats and Oils  Reduced-sodium salad dressings. Unsalted butter. Other  Unsalted popcorn and pretzels. The items listed above may not be a complete list of recommended foods or beverages. Contact your dietitian for more options.  What foods are not recommended? Grains  Instant hot cereals. Bread stuffing, pancake, and biscuit mixes. Croutons. Seasoned rice or pasta mixes. Noodle soup cups. Boxed or frozen macaroni and cheese. Self-rising flour. Regular salted crackers. Vegetables  Regular canned vegetables. Regular  canned  tomato sauce and paste. Regular tomato and vegetable juices. Frozen vegetables in sauces. Salted [place] fries. Olives. Angie Fava. Relishes. Sauerkraut. Salsa. Meat and Other Protein Products  Salted, canned, smoked, spiced, or pickled meats, seafood, or fish. Bacon, ham, sausage, hot dogs, corned beef, chipped beef, and packaged luncheon meats. Salt pork. Jerky. Pickled herring. Anchovies, regular canned tuna, and sardines. Salted nuts. Dairy  Processed cheese and cheese spreads. Cheese curds. Blue cheese and cottage cheese. Buttermilk. Condiments  Onion and garlic salt, seasoned salt, table salt, and sea salt. Canned and packaged gravies. Worcestershire sauce. Tartar sauce. Barbecue sauce. Teriyaki sauce. Soy sauce, including reduced sodium. Steak sauce. Fish sauce. Oyster sauce. Cocktail sauce. Horseradish that you find on the shelf. Regular ketchup and mustard. Meat flavorings and tenderizers. Bouillon cubes. Hot sauce. Tabasco sauce. Marinades. Taco seasonings. Relishes. Fats and Oils  Regular salad dressings. Salted butter. Margarine. Ghee. Bacon fat. Other  Potato and tortilla chips. Corn chips and puffs. Salted popcorn and pretzels. Canned or dried soups. Pizza. Frozen entrees and pot pies. The items listed above may not be a complete list of foods and beverages to avoid. Contact your dietitian for more information.  This information is not intended to replace advice given to you by your health care provider. Make sure you discuss any questions you have with your health care provider. Document Released: 03/25/2002 Document Revised: 03/10/2016 Document Reviewed: 08/07/2013 Elsevier Interactive Patient Education  2017 [place], Ermalinda Barrios, [place]  09/12/2016 2:54 PM    [facility] [place], [place], [place]  [address] Phone: [phone]; Fax: [phone]

## 2016-09-12 NOTE — Patient Instructions (Addendum)
Medication Instructions:  Your physician recommends that you continue on your current medications as directed. Please refer to the Current Medication list given to you today.  Labwork: Your physician recommends that you return for lab work tomorrow for [employer], lipid and liver panel. Please be fasting for this lab appointment.   Testing/Procedures: Your physician has requested that you have an echocardiogram. Echocardiography is a painless test that uses sound waves to create images of your heart. It provides your doctor with information about the size and shape of your heart and how well your heart's chambers and valves are working. This procedure takes approximately one hour. There are no restrictions for this procedure.  Follow-Up: Your physician wants you to follow-up in: 6 months with Dr. Burt Knack. You will receive a reminder letter in the mail two months in advance. If you don't receive a letter, please call our office to schedule the follow-up appointment.   If you need a refill on your cardiac medications before your next appointment, please call your pharmacy.   Low-Sodium Eating Plan Sodium raises blood pressure and causes water to be held in the body. Getting less sodium from food will help lower your blood pressure, reduce any swelling, and protect your heart, liver, and kidneys. We get sodium by adding salt (sodium chloride) to food. Most of our sodium comes from canned, boxed, and frozen foods. Restaurant foods, fast foods, and pizza are also very high in sodium. Even if you take medicine to lower your blood pressure or to reduce fluid in your body, getting less sodium from your food is important. What is my plan? Most people should limit their sodium intake to 2,300 mg a day. Your health care provider recommends that you limit your sodium intake to __________ a day. What do I need to know about this eating plan? For the low-sodium eating plan, you will follow these general  guidelines:  Choose foods with a % Daily Value for sodium of less than 5% (as listed on the food label).  Use salt-free seasonings or herbs instead of table salt or sea salt.  Check with your health care provider or pharmacist before using salt substitutes.  Eat fresh foods.  Eat more vegetables and fruits.  Limit canned vegetables. If you do use them, rinse them well to decrease the sodium.  Limit cheese to 1 oz (28 g) per day.  Eat lower-sodium products, often labeled as "lower sodium" or "no salt added."  Avoid foods that contain monosodium glutamate (MSG). MSG is sometimes added to [place] food and some canned foods.  Check food labels (Nutrition Facts labels) on foods to learn how much sodium is in one serving.  Eat more home-cooked food and less restaurant, buffet, and fast food.  When eating at a restaurant, ask that your food be prepared with less salt, or no salt if possible. How do I read food labels for sodium information? The Nutrition Facts label lists the amount of sodium in one serving of the food. If you eat more than one serving, you must multiply the listed amount of sodium by the number of servings. Food labels may also identify foods as:  Sodium free-Less than 5 mg in a serving.  Very low sodium-35 mg or less in a serving.  Low sodium-140 mg or less in a serving.  Light in sodium-50% less sodium in a serving. For example, if a food that usually has 300 mg of sodium is changed to become light in sodium, it will have  150 mg of sodium.  Reduced sodium-25% less sodium in a serving. For example, if a food that usually has 400 mg of sodium is changed to reduced sodium, it will have 300 mg of sodium. What foods can I eat? Grains  Low-sodium cereals, including oats, puffed wheat and rice, and shredded wheat cereals. Low-sodium crackers. Unsalted rice and pasta. Lower-sodium bread. Vegetables  Frozen or fresh vegetables. Low-sodium or reduced-sodium canned  vegetables. Low-sodium or reduced-sodium tomato sauce and paste. Low-sodium or reduced-sodium tomato and vegetable juices. Fruits  Fresh, frozen, and canned fruit. Fruit juice. Meat and Other Protein Products  Low-sodium canned tuna and salmon. Fresh or frozen meat, poultry, seafood, and fish. Lamb. Unsalted nuts. Dried beans, peas, and lentils without added salt. Unsalted canned beans. Homemade soups without salt. Eggs. Dairy  Milk. Soy milk. Ricotta cheese. Low-sodium or reduced-sodium cheeses. Yogurt. Condiments  Fresh and dried herbs and spices. Salt-free seasonings. Onion and garlic powders. Low-sodium varieties of mustard and ketchup. Fresh or refrigerated horseradish. Lemon juice. Fats and Oils  Reduced-sodium salad dressings. Unsalted butter. Other  Unsalted popcorn and pretzels. The items listed above may not be a complete list of recommended foods or beverages. Contact your dietitian for more options.  What foods are not recommended? Grains  Instant hot cereals. Bread stuffing, pancake, and biscuit mixes. Croutons. Seasoned rice or pasta mixes. Noodle soup cups. Boxed or frozen macaroni and cheese. Self-rising flour. Regular salted crackers. Vegetables  Regular canned vegetables. Regular canned tomato sauce and paste. Regular tomato and vegetable juices. Frozen vegetables in sauces. Salted [place] fries. Olives. Angie Fava. Relishes. Sauerkraut. Salsa. Meat and Other Protein Products  Salted, canned, smoked, spiced, or pickled meats, seafood, or fish. Bacon, ham, sausage, hot dogs, corned beef, chipped beef, and packaged luncheon meats. Salt pork. Jerky. Pickled herring. Anchovies, regular canned tuna, and sardines. Salted nuts. Dairy  Processed cheese and cheese spreads. Cheese curds. Blue cheese and cottage cheese. Buttermilk. Condiments  Onion and garlic salt, seasoned salt, table salt, and sea salt. Canned and packaged gravies. Worcestershire sauce. Tartar sauce. Barbecue sauce.  Teriyaki sauce. Soy sauce, including reduced sodium. Steak sauce. Fish sauce. Oyster sauce. Cocktail sauce. Horseradish that you find on the shelf. Regular ketchup and mustard. Meat flavorings and tenderizers. Bouillon cubes. Hot sauce. Tabasco sauce. Marinades. Taco seasonings. Relishes. Fats and Oils  Regular salad dressings. Salted butter. Margarine. Ghee. Bacon fat. Other  Potato and tortilla chips. Corn chips and puffs. Salted popcorn and pretzels. Canned or dried soups. Pizza. Frozen entrees and pot pies. The items listed above may not be a complete list of foods and beverages to avoid. Contact your dietitian for more information.  This information is not intended to replace advice given to you by your health care provider. Make sure you discuss any questions you have with your health care provider. Document Released: 03/25/2002 Document Revised: 03/10/2016 Document Reviewed: 08/07/2013 Elsevier Interactive Patient Education  2017 [employer].

## 2016-09-13 ENCOUNTER — Other Ambulatory Visit: Payer: Medicare Other | Admitting: *Deleted

## 2016-09-13 DIAGNOSIS — I2510 Atherosclerotic heart disease of native coronary artery without angina pectoris: Secondary | ICD-10-CM | POA: Diagnosis not present

## 2016-09-13 DIAGNOSIS — I340 Nonrheumatic mitral (valve) insufficiency: Secondary | ICD-10-CM | POA: Diagnosis not present

## 2016-09-13 DIAGNOSIS — I5032 Chronic diastolic (congestive) heart failure: Secondary | ICD-10-CM

## 2016-09-13 DIAGNOSIS — I10 Essential (primary) hypertension: Secondary | ICD-10-CM | POA: Diagnosis not present

## 2016-09-13 LAB — BASIC METABOLIC PANEL
BUN: 20 mg/dL (ref 7–25)
CO2: 26 mmol/L (ref 20–31)
Calcium: 9.2 mg/dL (ref 8.6–10.4)
Chloride: 106 mmol/L (ref 98–110)
Creat: 1.05 mg/dL — ABNORMAL HIGH (ref 0.50–0.99)
Glucose, Bld: 108 mg/dL — ABNORMAL HIGH (ref 65–99)
Potassium: 4.3 mmol/L (ref 3.5–5.3)
Sodium: 142 mmol/L (ref 135–146)

## 2016-09-13 LAB — LIPID PANEL
Cholesterol: 154 mg/dL (ref <200)
HDL: 31 mg/dL — ABNORMAL LOW (ref >50)
LDL Cholesterol: 88 mg/dL (ref <100)
Total CHOL/HDL Ratio: 5 Ratio — ABNORMAL HIGH (ref <5.0)
Triglycerides: 175 mg/dL — ABNORMAL HIGH (ref <150)
VLDL: 35 mg/dL — ABNORMAL HIGH (ref <30)

## 2016-09-14 ENCOUNTER — Telehealth: Payer: Self-pay | Admitting: Cardiovascular Disease

## 2016-09-14 NOTE — Telephone Encounter (Signed)
Returned pts call and she has been made aware of her lab results. 

## 2016-09-14 NOTE — Telephone Encounter (Signed)
Mrs. Mckinzey is returning a call about her lab results . Please call on her cell at [phone]

## 2016-10-04 ENCOUNTER — Ambulatory Visit (HOSPITAL_COMMUNITY): Payer: Medicare Other | Attending: Cardiovascular Disease

## 2016-10-04 ENCOUNTER — Other Ambulatory Visit: Payer: Self-pay

## 2016-10-04 DIAGNOSIS — I340 Nonrheumatic mitral (valve) insufficiency: Secondary | ICD-10-CM | POA: Insufficient documentation

## 2016-10-04 DIAGNOSIS — I110 Hypertensive heart disease with heart failure: Secondary | ICD-10-CM | POA: Diagnosis not present

## 2016-10-04 DIAGNOSIS — I2510 Atherosclerotic heart disease of native coronary artery without angina pectoris: Secondary | ICD-10-CM | POA: Insufficient documentation

## 2016-10-04 DIAGNOSIS — I10 Essential (primary) hypertension: Secondary | ICD-10-CM

## 2016-10-04 DIAGNOSIS — I5032 Chronic diastolic (congestive) heart failure: Secondary | ICD-10-CM | POA: Diagnosis not present

## 2016-10-05 ENCOUNTER — Encounter: Payer: Self-pay | Admitting: Internal Medicine

## 2016-10-05 ENCOUNTER — Ambulatory Visit (INDEPENDENT_AMBULATORY_CARE_PROVIDER_SITE_OTHER): Payer: Medicare Other | Admitting: Internal Medicine

## 2016-10-05 ENCOUNTER — Telehealth: Payer: Self-pay | Admitting: Cardiovascular Disease

## 2016-10-05 VITALS — BP 130/86 | HR 84 | Temp 98.4°F | Ht 66.0 in | Wt 187.4 lb

## 2016-10-05 DIAGNOSIS — M109 Gout, unspecified: Secondary | ICD-10-CM

## 2016-10-05 DIAGNOSIS — I2510 Atherosclerotic heart disease of native coronary artery without angina pectoris: Secondary | ICD-10-CM | POA: Diagnosis not present

## 2016-10-05 DIAGNOSIS — I10 Essential (primary) hypertension: Secondary | ICD-10-CM | POA: Diagnosis not present

## 2016-10-05 DIAGNOSIS — I5032 Chronic diastolic (congestive) heart failure: Secondary | ICD-10-CM

## 2016-10-05 DIAGNOSIS — Z8679 Personal history of other diseases of the circulatory system: Secondary | ICD-10-CM

## 2016-10-05 DIAGNOSIS — M8949 Other hypertrophic osteoarthropathy, multiple sites: Secondary | ICD-10-CM

## 2016-10-05 DIAGNOSIS — M150 Primary generalized (osteo)arthritis: Secondary | ICD-10-CM

## 2016-10-05 DIAGNOSIS — R931 Abnormal findings on diagnostic imaging of heart and coronary circulation: Secondary | ICD-10-CM

## 2016-10-05 DIAGNOSIS — M159 Polyosteoarthritis, unspecified: Secondary | ICD-10-CM

## 2016-10-05 MED ORDER — HYDROCODONE-ACETAMINOPHEN 5-325 MG PO TABS: ORAL_TABLET | ORAL | 0 refills | Status: DC

## 2016-10-05 MED ORDER — ALLOPURINOL 100 MG PO TABS: 200.0000 mg | ORAL_TABLET | Freq: Every day | ORAL | 6 refills | Status: DC

## 2016-10-05 NOTE — Telephone Encounter (Signed)
F/u message ° °Pt returning RN call. Please call back to discuss  °

## 2016-10-05 NOTE — Progress Notes (Signed)
Pre visit review using our clinic review tool, if applicable. No additional management support is needed unless otherwise documented below in the visit note. 

## 2016-10-05 NOTE — Patient Instructions (Addendum)
Aleve 2 tablets twice daily for the next 5-7 days for acute gout  Increase allopurinol to 2 tablets once daily in 4 weeks   Gout Introduction Gout is painful swelling that can happen in some of your joints. Gout is a type of arthritis. This condition is caused by having too much uric acid in your body. Uric acid is a chemical that is made when your body breaks down substances called purines. If your body has too much uric acid, sharp crystals can form and build up in your joints. This causes pain and swelling. Gout attacks can happen quickly and be very painful (acute gout). Over time, the attacks can affect more joints and happen more often (chronic gout). Follow these instructions at home: During a Gout Attack  If directed, put ice on the painful area:  Put ice in a plastic bag.  Place a towel between your skin and the bag.  Leave the ice on for 20 minutes, 2-3 times a day.  Rest the joint as much as possible. If the joint is in your leg, you may be given crutches to use.  Raise (elevate) the painful joint above the level of your heart as often as you can.  Drink enough fluids to keep your pee (urine) clear or pale yellow.  Take over-the-counter and prescription medicines only as told by your doctor.  Do not drive or use heavy machinery while taking prescription pain medicine.  Follow instructions from your doctor about what you can or cannot eat and drink.  Return to your normal activities as told by your doctor. Ask your doctor what activities are safe for you. Avoiding Future Gout Attacks  Follow a low-purine diet as told by a specialist (dietitian) or your doctor. Avoid foods and drinks that have a lot of purines, such as:  Liver.  Kidney.  Anchovies.  Asparagus.  Herring.  Mushrooms  Mussels.  Beer.  Limit alcohol intake to no more than 1 drink a day for nonpregnant women and 2 drinks a day for men. One drink equals 12 oz of beer, 5 oz of wine, or 1 oz of  hard liquor.  Stay at a healthy weight or lose weight if you are overweight. If you want to lose weight, talk with your doctor. It is important that you do not lose weight too fast.  Start or continue an exercise plan as told by your doctor.  Drink enough fluids to keep your pee clear or pale yellow.  Take over-the-counter and prescription medicines only as told by your doctor.  Keep all follow-up visits as told by your doctor. This is important. Contact a doctor if:  You have another gout attack.  You still have symptoms of a gout attack after10 days of treatment.  You have problems (side effects) because of your medicines.  You have chills or a fever.  You have burning pain when you pee (urinate).  You have pain in your lower back or belly. Get help right away if:  You have very bad pain.  Your pain cannot be controlled.  You cannot pee. This information is not intended to replace advice given to you by your health care provider. Make sure you discuss any questions you have with your health care provider. Document Released: 07/12/2008 Document Revised: 03/10/2016 Document Reviewed: 07/16/2015  2017 Elsevier

## 2016-10-05 NOTE — Telephone Encounter (Signed)
-----   Message from Imogene Burn, PA-C sent at 10/05/2016  9:59 AM EST ----- Mild LV dysfunction- a little lower than before with new wall motion abnormality in anterior region. Recommend exercise myoview if she can walk on treadmill. If not, do a lexiscan. Mitral valve is only mild leakage. Can f/u with me in a couple of weeks if myoview abnormal.

## 2016-10-05 NOTE — Telephone Encounter (Signed)
Pt aware iof her echo results and that she needs to have a stress test.  Pt states that she is having a bout with gout right now, so we will order a lexiscan.  Pt aware that the scheduler will call her on her cell phone to get this scheduled. Pt verbalized understanding.

## 2016-10-11 ENCOUNTER — Telehealth (HOSPITAL_COMMUNITY): Payer: Self-pay | Admitting: *Deleted

## 2016-10-11 NOTE — Telephone Encounter (Signed)
Patient given detailed instructions per Myocardial Perfusion Study Information Sheet for the test on 10/14/16 at 0915. Patient notified to arrive 15 minutes early and that it is imperative to arrive on time for appointment to keep from having the test rescheduled.  If you need to cancel or reschedule your appointment, please call the office within 24 hours of your appointment. Failure to do so may result in a cancellation of your appointment, and a $50 no show fee. Patient verbalized understanding.Tracy [name], Ranae Palms

## 2016-10-14 ENCOUNTER — Ambulatory Visit (HOSPITAL_COMMUNITY): Payer: Medicare Other | Attending: Cardiovascular Disease

## 2016-10-14 DIAGNOSIS — I10 Essential (primary) hypertension: Secondary | ICD-10-CM | POA: Insufficient documentation

## 2016-10-14 DIAGNOSIS — Z8679 Personal history of other diseases of the circulatory system: Secondary | ICD-10-CM

## 2016-10-14 DIAGNOSIS — I2510 Atherosclerotic heart disease of native coronary artery without angina pectoris: Secondary | ICD-10-CM | POA: Insufficient documentation

## 2016-10-14 DIAGNOSIS — I5032 Chronic diastolic (congestive) heart failure: Secondary | ICD-10-CM | POA: Diagnosis not present

## 2016-10-14 LAB — MYOCARDIAL PERFUSION IMAGING
LV dias vol: 124 mL (ref 46–106)
LV sys vol: 68 mL
Peak HR: 100 {beats}/min
RATE: 0.29
Rest HR: 76 {beats}/min
SDS: 2
SRS: 10
SSS: 12
TID: 0.99

## 2016-10-14 MED ORDER — TECHNETIUM TC 99M TETROFOSMIN IV KIT
10.7000 | PACK | Freq: Once | INTRAVENOUS | Status: AC | PRN
  Administered 2016-10-14: 10.7 via INTRAVENOUS
  Filled 2016-10-14: qty 11

## 2016-10-14 MED ORDER — TECHNETIUM TC 99M TETROFOSMIN IV KIT
31.6000 | PACK | Freq: Once | INTRAVENOUS | Status: AC | PRN
  Administered 2016-10-14: 31.6 via INTRAVENOUS
  Filled 2016-10-14: qty 32

## 2016-10-14 MED ORDER — REGADENOSON 0.4 MG/5ML IV SOLN
0.4000 mg | Freq: Once | INTRAVENOUS | Status: AC
  Administered 2016-10-14: 0.4 mg via INTRAVENOUS

## 2016-10-14 MED ORDER — AMINOPHYLLINE 25 MG/ML IV SOLN
75.0000 mg | Freq: Once | INTRAVENOUS | Status: AC
  Administered 2016-10-14: 75 mg via INTRAVENOUS

## 2016-10-18 ENCOUNTER — Encounter: Payer: Self-pay | Admitting: Internal Medicine

## 2016-10-18 ENCOUNTER — Ambulatory Visit (INDEPENDENT_AMBULATORY_CARE_PROVIDER_SITE_OTHER): Payer: Medicare Other | Admitting: Internal Medicine

## 2016-10-18 VITALS — BP 126/66 | HR 85 | Temp 98.8°F | Ht 64.5 in | Wt 184.2 lb

## 2016-10-18 DIAGNOSIS — E7800 Pure hypercholesterolemia, unspecified: Secondary | ICD-10-CM

## 2016-10-18 DIAGNOSIS — I10 Essential (primary) hypertension: Secondary | ICD-10-CM | POA: Diagnosis not present

## 2016-10-18 DIAGNOSIS — Z0000 Encounter for general adult medical examination without abnormal findings: Secondary | ICD-10-CM | POA: Diagnosis not present

## 2016-10-18 DIAGNOSIS — E790 Hyperuricemia without signs of inflammatory arthritis and tophaceous disease: Secondary | ICD-10-CM | POA: Diagnosis not present

## 2016-10-18 DIAGNOSIS — I252 Old myocardial infarction: Secondary | ICD-10-CM | POA: Diagnosis not present

## 2016-10-18 NOTE — Progress Notes (Signed)
Pre visit review using our clinic review tool, if applicable. No additional management support is needed unless otherwise documented below in the visit note. 

## 2016-10-18 NOTE — Progress Notes (Signed)
Subjective:    Patient ID: Tracy [name], female    DOB: 19-Jul-1946, 71 y.o.   MRN: [id number]  HPI 71 year old patient who is seen today for an annual preventive health examination as well as annual Medicare wellness visit .  She is doing fairly well, although recovering from an episode of Gouty arthritis. She has had recent cardiology evaluation which has included 2-D echocardiogram as well as nuclear stress test.  This revealed mildly impaired LV function, and wall motion abnormalities consistent with prior MRI.  There were no reversible defects. She has a history of essential hypertension.  She remains on atorvastatin.  Past Medical History:  Diagnosis Date  . Allergic rhinitis   . Headache(784.0)   . Hyperlipidemia   . Hypertension   . Low back pain   . Menopausal syndrome   . Myocardial infarct    hx of  . Overactive bladder      Social History   Social History  . Marital status: Married    Spouse name: N/A  . Number of children: N/A  . Years of education: N/A   Occupational History  . 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 Smoking status: Former Smoker    Quit date: 07/01/2012  . Smokeless tobacco: Never Used     Comment: electronic cigarettes  . Alcohol use No  . Drug use: No  . Sexual activity: Not on file   Other Topics Concern  . Not on file   Social History Narrative  . No narrative on file    Past Surgical History:  Procedure Laterality Date  . ANGIOPLASTY     stent 2007  . TONSILLECTOMY      Family History  Problem Relation Age of Onset  . Adopted: Yes  . Other      patient is adopted    Allergies  Allergen Reactions  . Sulfamethoxazole     REACTION: unspecified  . Erythromycin Rash    But isn't certain    Current Outpatient Prescriptions on File Prior to Visit  Medication Sig Dispense Refill  . allopurinol (ZYLOPRIM) 100 MG tablet Take 2 tablets (200 mg total) by mouth daily. 30 tablet 6  .  ALPRAZolam (XANAX) 0.25 MG tablet TAKE 1 TABLET BY MOUTH TWICE A DAY AS NEEDED FOR ANXIETY 60 tablet 5  . amLODipine (NORVASC) 2.5 MG tablet Take 1 tablet (2.5 mg total) by mouth daily. 30 tablet 1  . Ascorbic Acid (VITAMIN C) 1000 MG tablet Take 1,000 mg by mouth daily.    Marland Kitchen aspirin 81 MG tablet Take 81 mg by mouth daily.      Marland Kitchen atorvastatin (LIPITOR) 40 MG tablet TAKE 1 TABLET BY MOUTH DAILY. 90 tablet 3  . cetirizine (ZYRTEC) 10 MG tablet Take 10 mg by mouth daily.    . Coenzyme Q10 (CO Q 10 PO) Take 300 mg by mouth daily.    . Cyanocobalamin 5000 MCG CAPS Take 1 capsule by mouth daily.    . furosemide (LASIX) 20 MG tablet Take 1 tablet (20 mg total) by mouth daily. 30 tablet 3  . HYDROcodone-acetaminophen (NORCO/VICODIN) 5-325 MG tablet TAKE 1 TABLET EVERY 6 HOURS AS NEEDED FOR PAIN 60 tablet 0  . losartan (COZAAR) 100 MG tablet Take 1 tablet (100 mg total) by mouth daily. 90 tablet 3  . metoprolol (LOPRESSOR) 100 MG tablet Take 1 tablet (100 mg total) by mouth 2 (two) times daily. 180 tablet 1  .  Misc Natural Products (TART CHERRY ADVANCED) CAPS Take 1 capsule by mouth daily.    . nitroGLYCERIN (NITROSTAT) 0.4 MG SL tablet Place 0.4 mg under the tongue every 5 (five) minutes as needed for chest pain.    . pantoprazole (PROTONIX) 40 MG tablet TAKE 1 TABLET (40 MG TOTAL) BY MOUTH DAILY. 90 tablet 1   No current facility-administered medications on file prior to visit.     BP 126/66 (BP Location: Left Arm, Patient Position: Sitting, Cuff Size: Large)   Pulse 85   Temp 98.8 F (37.1 C) (Oral)   Ht 5' 4.5" (1.638 m)   Wt 184 lb 4 oz (83.6 kg)   SpO2 94%   BMI 31.14 kg/m   Medicare wellness visit  1. Risk factors, based on past  M,S,F history.  Patient has known coronary artery disease and prior MI.  Cardio vascular risk factors include hypertension, dyslipidemia and history of tobacco use  2.  Physical activities:no major exercise limitations but no rigorous activities.  Does walk  several times per week weather permitting  3.  Depression/mood:no history of major depression or mood disorder  4.  Hearing:no deficits  5.  ADL's:independent  6.  Fall risk:low  7.  Home safety:no problems identified  8.  Height weight, and visual acuity;height and weight stable no change in visual acuity  9.  Counseling:total smoking cessation discussed and encouraged  10. Lab orders based on risk factors:laboratory profile including lipid panel will be reviewed  11. Referral :follow-up cardiology  12. Care plan:continue efforts at aggressive risk factor modification.  Total smoking cessation encouraged  13. Cognitive assessment: alert in order with normal affect.  No cognitive dysfunction  14. Screening: Patient provided with a written and personalized 5-10 year screening schedule in the AVS.    15. Provider List Update: primary care medicine, cardiology, radiology     Review of Systems  Constitutional: Negative.   HENT: Negative for congestion, dental problem, hearing loss, rhinorrhea, sinus pressure, sore throat and tinnitus.   Eyes: Negative for pain, discharge and visual disturbance.  Respiratory: Negative for cough and shortness of breath.   Cardiovascular: Negative for chest pain, palpitations and leg swelling.  Gastrointestinal: Negative for abdominal distention, abdominal pain, blood in stool, constipation, diarrhea, nausea and vomiting.  Genitourinary: Negative for difficulty urinating, dysuria, flank pain, frequency, hematuria, pelvic pain, urgency, vaginal bleeding, vaginal discharge and vaginal pain.  Musculoskeletal: Positive for arthralgias and joint swelling. Negative for gait problem.  Skin: Negative for rash.  Neurological: Negative for dizziness, syncope, speech difficulty, weakness, numbness and headaches.  Hematological: Negative for adenopathy.  Psychiatric/Behavioral: Negative for agitation, behavioral problems and dysphoric mood. The patient is not  nervous/anxious.        Objective:   Physical Exam  Constitutional: She is oriented to person, place, and time. She appears well-developed and well-nourished.  HENT:  Head: Normocephalic and atraumatic.  Right Ear: External ear normal.  Left Ear: External ear normal.  Mouth/Throat: Oropharynx is clear and moist.  Eyes: Conjunctivae and EOM are normal.  Neck: Normal range of motion. Neck supple. No JVD present. No thyromegaly present.  Cardiovascular: Normal rate, regular rhythm, normal heart sounds and intact distal pulses.   No murmur heard. Pulmonary/Chest: Effort normal and breath sounds normal. She has no wheezes. She has no rales.  Abdominal: Soft. Bowel sounds are normal. She exhibits no distension and no mass. There is no tenderness. There is no rebound and no guarding.  Genitourinary: Vagina normal.  Musculoskeletal:  Normal range of motion. She exhibits no edema or tenderness.  Neurological: She is alert and oriented to person, place, and time. She has normal reflexes. No cranial nerve deficit. She exhibits normal muscle tone. Coordination normal.  Skin: Skin is warm and dry. No rash noted.  Psychiatric: She has a normal mood and affect. Her behavior is normal.          Assessment & Plan:   Preventive health examination Subsequent Medicare wellness visit Coronary artery disease Mild left ventricular dysfunction Dyslipidemia.  Continue statin therapy Essential hypertension Resolving gouty arthritis.  Patient to increase allopurinol in 2 weeks after gouty arthritis has resolved  Cardiology follow-up as scheduled Recheck here 3 months  Kurtis Anastasia Pilar Plate

## 2016-10-18 NOTE — Patient Instructions (Signed)
Cardiology follow-up as scheduled  Limit your sodium (Salt) intake  You need to lose weight.  Consider a lower calorie diet and regular exercise.  Please check your blood pressure on a regular basis.  If it is consistently greater than 150/90, please make an office appointment.  Return in 6 months for follow-up

## 2016-10-19 LAB — HEPATITIS C ANTIBODY: HCV Ab: NEGATIVE

## 2016-10-19 NOTE — Progress Notes (Signed)
Subjective:    Patient ID: Tracy [name], female    DOB: Dec 09, 1945, 71 y.o.   MRN: [id number]  HPI  71 year old patient who has a history of gout.  She presents with a several day history of increasing pain and swelling involving her right foot and ankle.  There has been no trauma.  Medical regimen includes allopurinol 100 mg daily. She has essential hypertension .  Her cardiac status has been stable.      Past Medical History:  Diagnosis Date  . Allergic rhinitis   . Headache(784.0)   . Hyperlipidemia   . Hypertension   . Low back pain   . Menopausal syndrome   . Myocardial infarct    hx of  . Overactive bladder      Social History        Social History  . Marital status: Married    Spouse name: N/A  . Number of children: N/A  . Years of education: N/A       Occupational History  . 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 Smoking status: Former Smoker    Quit date: 07/01/2012  . Smokeless tobacco: Never Used     Comment: electronic cigarettes  . Alcohol use No  . Drug use: No  . Sexual activity: Not on file       Other Topics Concern  . Not on file      Social History Narrative  . No narrative on file         Past Surgical History:  Procedure Laterality Date  . ANGIOPLASTY     stent 2007  . TONSILLECTOMY            Family History  Problem Relation Age of Onset  . Adopted: Yes  . Other      patient is adopted         Allergies  Allergen Reactions  . Sulfamethoxazole     REACTION: unspecified  . Erythromycin Rash    But isn't certain          Current Outpatient Prescriptions on File Prior to Visit  Medication Sig Dispense Refill  . allopurinol (ZYLOPRIM) 100 MG tablet Take 1 tablet (100 mg total) by mouth daily. 30 tablet 6  . ALPRAZolam (XANAX) 0.25 MG tablet TAKE 1 TABLET BY MOUTH TWICE A DAY AS NEEDED FOR ANXIETY 60 tablet 5  .  amLODipine (NORVASC) 2.5 MG tablet Take 1 tablet (2.5 mg total) by mouth daily. 30 tablet 1  . Ascorbic Acid (VITAMIN C) 1000 MG tablet Take 1,000 mg by mouth daily.    Marland Kitchen aspirin 81 MG tablet Take 81 mg by mouth daily.      Marland Kitchen atorvastatin (LIPITOR) 40 MG tablet TAKE 1 TABLET BY MOUTH DAILY. 90 tablet 3  . cetirizine (ZYRTEC) 10 MG tablet Take 10 mg by mouth daily.    . Coenzyme Q10 (CO Q 10 PO) Take 300 mg by mouth daily.    . Cyanocobalamin 5000 MCG CAPS Take 1 capsule by mouth daily.    . furosemide (LASIX) 20 MG tablet Take 1 tablet (20 mg total) by mouth daily. 30 tablet 3  . losartan (COZAAR) 100 MG tablet Take 1 tablet (100 mg total) by mouth daily. 90 tablet 3  . metoprolol (LOPRESSOR) 100 MG tablet Take 1 tablet (100 mg total) by mouth 2 (two)  times daily. 180 tablet 1  . Misc Natural Products (TART CHERRY ADVANCED) CAPS Take 1 capsule by mouth daily.    . nitroGLYCERIN (NITROSTAT) 0.4 MG SL tablet Place 0.4 mg under the tongue every 5 (five) minutes as needed for chest pain.    . pantoprazole (PROTONIX) 40 MG tablet TAKE 1 TABLET (40 MG TOTAL) BY MOUTH DAILY. 90 tablet 1   No current facility-administered medications on file prior to visit.     BP 130/86 (BP Location: Left Arm, Patient Position: Sitting, Cuff Size: Large)   Pulse 84   Temp 98.4 F (36.9 C) (Oral)   Ht 5\' 6"  (1.676 m)   Wt 187 lb 6.1 oz (85 kg)   SpO2 96%   BMI 30.24 kg/m     Review of Systems  Constitutional: Negative.   Musculoskeletal: Positive for arthralgias, gait problem and joint swelling.       Objective:   Physical Exam  Constitutional:  Blood pressure 130/82  Musculoskeletal:  Soft tissue swelling about the right ankle and especially the dorsal aspect of the proximal foot.  Foot was painful and warm to touch          Assessment & Plan:   Acute gouty arthritis.  Patient has been using Aleve sparingly.  Will increase dose to 400 mg 3 times a day for the next  5-7 days.  Increase allopurinol to 200 mg daily Essential hypertension  [place] Orphanos, LPNLicensed Practical Nurse 10/05/2016  Edit          Pre visit review using our clinic review tool, if applicable. No additional management support is needed unless otherwise documented below in the visit note.

## 2016-10-21 ENCOUNTER — Encounter: Payer: Self-pay | Admitting: Physician Assistant

## 2016-10-21 ENCOUNTER — Other Ambulatory Visit: Payer: Self-pay | Admitting: Family Medicine

## 2016-10-21 NOTE — Telephone Encounter (Signed)
This encounter was created in error - please disregard.

## 2016-10-21 NOTE — Telephone Encounter (Signed)
Ms. Groskopf is calling because she would like to know her results from the Myocardial Perfusion. Please call, thanks.

## 2016-10-21 NOTE — Telephone Encounter (Signed)
Pt has one amlodipine pill left and would like 90 day supply sent to [facility] on [address]

## 2016-10-27 ENCOUNTER — Encounter: Payer: Self-pay | Admitting: Cardiovascular Disease

## 2016-10-27 ENCOUNTER — Ambulatory Visit (INDEPENDENT_AMBULATORY_CARE_PROVIDER_SITE_OTHER): Payer: Medicare Other | Admitting: Cardiovascular Disease

## 2016-10-27 VITALS — BP 110/70 | HR 93 | Ht 64.5 in | Wt 184.0 lb

## 2016-10-27 DIAGNOSIS — I10 Essential (primary) hypertension: Secondary | ICD-10-CM

## 2016-10-27 DIAGNOSIS — I5032 Chronic diastolic (congestive) heart failure: Secondary | ICD-10-CM | POA: Diagnosis not present

## 2016-10-27 NOTE — Patient Instructions (Signed)
Medication Instructions:  Your physician has recommended you make the following change in your medication:  1. START Furosemide 20mg  take one by mouth daily  Labwork: Your physician recommends that you return for lab work in: 3 WEEKS (BMP)  Testing/Procedures: No new orders.   Follow-Up: Your physician wants you to follow-up in: 1 YEAR with Dr Burt Knack.  You will receive a reminder letter in the mail two months in advance. If you don't receive a letter, please call our office to schedule the follow-up appointment.   Any Other Special Instructions Will Be Listed Below (If Applicable).     If you need a refill on your cardiac medications before your next appointment, please call your pharmacy.

## 2016-10-27 NOTE — Progress Notes (Signed)
Cardiology Office Note Date:  10/27/2016   ID:  Tracy [name], DOB February 05, 1946, MRN [id number]  PCP:  Tracy Cowden, MD  Cardiologist:  Tracy Mocha, MD    No chief complaint on file.    History of Present Illness: Tracy [name] is a 71 y.o. female who presents for follow-up evaluation. The patient has been followed for coronary artery disease. She initially presented with an acute MI related to occlusion of her left circumflex in 2007. She was treated with PCI using a bare-metal stent. She's had episodes of musculoskeletal chest pain in the past. An echocardiogram was done and demonstrated mild reduction in LV function. A nuclear stress scan was performed for follow-up and it showed a primarily fixed defect in the inferolateral and anterolateral walls. She presents today for further discussion.  The patient does complain of chest pain along the left sternum. This is only present when she pushes on her chest. She has not had any chest discomfort with walking. She does admit to shortness of breath and has had intermittent episodes of orthopnea. She's had some leg edema improved with furosemide which she is currently taking every other day. She denies PND, lightheadedness, or syncope.   Past Medical History:  Diagnosis Date  . Allergic rhinitis   . Headache(784.0)   . Hyperlipidemia   . Hypertension   . Low back pain   . Menopausal syndrome   . Myocardial infarct    hx of  . Overactive bladder     Past Surgical History:  Procedure Laterality Date  . ANGIOPLASTY     stent 2007  . TONSILLECTOMY      Current Outpatient Prescriptions  Medication Sig Dispense Refill  . allopurinol (ZYLOPRIM) 100 MG tablet Take 2 tablets (200 mg total) by mouth daily. 30 tablet 6  . ALPRAZolam (XANAX) 0.25 MG tablet TAKE 1 TABLET BY MOUTH TWICE A DAY AS NEEDED FOR ANXIETY 60 tablet 5  . amLODipine (NORVASC) 2.5 MG tablet TAKE 1 TABLET (2.5 MG TOTAL) BY MOUTH DAILY. 30  tablet 3  . Ascorbic Acid (VITAMIN C) 1000 MG tablet Take 1,000 mg by mouth daily.    Marland Kitchen aspirin 81 MG tablet Take 81 mg by mouth daily.      Marland Kitchen atorvastatin (LIPITOR) 40 MG tablet TAKE 1 TABLET BY MOUTH DAILY. 90 tablet 3  . cetirizine (ZYRTEC) 10 MG tablet Take 10 mg by mouth daily.    . Coenzyme Q10 (CO Q 10 PO) Take 300 mg by mouth daily.    . Cyanocobalamin 5000 MCG CAPS Take 1 capsule by mouth daily.    . furosemide (LASIX) 20 MG tablet Take 1 tablet (20 mg total) by mouth daily. 30 tablet 3  . HYDROcodone-acetaminophen (NORCO/VICODIN) 5-325 MG tablet TAKE 1 TABLET EVERY 6 HOURS AS NEEDED FOR PAIN 60 tablet 0  . losartan (COZAAR) 100 MG tablet Take 1 tablet (100 mg total) by mouth daily. 90 tablet 3  . metoprolol (LOPRESSOR) 100 MG tablet Take 1 tablet (100 mg total) by mouth 2 (two) times daily. 180 tablet 1  . Misc Natural Products (TART CHERRY ADVANCED) CAPS Take 1 capsule by mouth daily.    . nitroGLYCERIN (NITROSTAT) 0.4 MG SL tablet Place 0.4 mg under the tongue every 5 (five) minutes as needed for chest pain.    . pantoprazole (PROTONIX) 40 MG tablet TAKE 1 TABLET (40 MG TOTAL) BY MOUTH DAILY. 90 tablet 1   No current facility-administered medications for this visit.  Allergies:   Sulfamethoxazole and Erythromycin   Social History:  The patient  reports that she quit smoking about 4 years ago. She has never used smokeless tobacco. She reports that she does not drink alcohol or use drugs.   Family History:  The patient's  family history is not on file. She was adopted.    ROS:  Please see the history of present illness.  Otherwise, review of systems is positive for Leg swelling, orthopnea, visual disturbance, depression, muscle pain, fatigue, constipation, anxiety, headaches.  All other systems are reviewed and negative.    PHYSICAL EXAM: VS:  BP 110/70   Pulse 93   Ht 5' 4.5" (1.638 m)   Wt 184 lb (83.5 kg)   SpO2 96%   BMI 31.10 kg/m  , BMI Body mass index is 31.1  kg/m. GEN: Well nourished, well developed, in no acute distress  HEENT: normal  Neck: no JVD, no masses. No carotid bruits Cardiac: RRR without murmur or gallop                Respiratory:  clear to auscultation bilaterally, normal work of breathing GI: soft, nontender, nondistended, + BS MS: no deformity or atrophy  Ext: no pretibial edema, pedal pulses 2+= bilaterally Skin: warm and dry, no rash Neuro:  Strength and sensation are intact Psych: euthymic mood, full affect  EKG:  EKG is not ordered today.  Recent Labs: 04/05/2016: ALT 48 09/13/2016: BUN 20; Creat 1.05; Potassium 4.3; Sodium 142   Lipid Panel     Component Value Date/Time   CHOL 154 09/13/2016 0738   TRIG 175 (H) 09/13/2016 0738   HDL 31 (L) 09/13/2016 0738   CHOLHDL 5.0 (H) 09/13/2016 0738   VLDL 35 (H) 09/13/2016 0738   LDLCALC 88 09/13/2016 0738   LDLDIRECT 111.0 01/05/2015 1019      Wt Readings from Last 3 Encounters:  10/27/16 184 lb (83.5 kg)  10/18/16 184 lb 4 oz (83.6 kg)  10/05/16 187 lb 6.1 oz (85 kg)     Cardiac Studies Reviewed: Echo 10-04-2016: Study Conclusions  - Left ventricle: The cavity size was normal. There was moderate   focal basal hypertrophy of the septum. Systolic function was   mildly reduced. The estimated ejection fraction was in the range   of 45% to 50%. Mild hypokinesis of the anterolateral and   inferolateral myocardium. - Aortic valve: Transvalvular velocity was within the normal range.   There was no stenosis. There was mild regurgitation. - Mitral valve: Transvalvular velocity was within the normal range.   There was no evidence for stenosis. There was mild regurgitation. - Left atrium: The atrium was mildly dilated. - Right ventricle: The cavity size was normal. Wall thickness was   normal. Systolic function was normal. - Atrial septum: No defect or patent foramen ovale was identified   by color flow Doppler. - Tricuspid valve: There was no regurgitation. -  Pulmonary arteries: Systolic pressure was within the normal   range. PA peak pressure: 24 mm Hg (S).  Myoview: Study Highlights     Nuclear stress EF: 45%.  There was no ST segment deviation noted during stress.  Findings consistent with prior myocardial infarction.  This is an intermediate risk study.  The left ventricular ejection fraction is mildly decreased (45-54%).   1. EF 45%, diffuse hypokinesis.  2. Fixed medium-sized, moderate intensity basal to mid inferolateral and anterolateral perfusion defect. This suggests prior infarction with minimal ischemia.  3. Overall intermediate risk study  with low EF and perfusion defect, but no ischemia noted.     ASSESSMENT AND PLAN: 1.  Chronic mixed systolic and diastolic heart failure with NYHA functional class II symptoms. I reviewed her imaging studies and I think ongoing medical therapy is indicated. She is appropriately on an ACE inhibitor and beta blocker. With her ongoing episodes of orthopnea, I recommended that she increase furosemide to 20 mg daily. She will continue on current doses of losartan and metoprolol. She's had significant hypertensive disease at times and this seems to be improved with the addition of amlodipine. Will follow up with a metabolic panel in a few weeks since she will be taking daily Lasix.  2. Hypertensive heart disease: As above, appears to be stable at present. Furosemide will be changed to once daily dosing. Blood pressure is well controlled.  3. Coronary artery disease, native vessel, without angina: She has atypical chest pain which clearly appears to be chest wall discomfort as it only occurs when she touches her left chest. There are no major areas of ischemia on her nuclear stress scan. Recommend ongoing medical therapy. She is treated with aspirin and a statin drug.  4. Hyperlipidemia: Most recent lipids reviewed as above. The patient takes atorvastatin 40 mg daily.  Current medicines are  reviewed with the patient today.  The patient does not have concerns regarding medicines.  Labs/ tests ordered today include:  No orders of the defined types were placed in this encounter.   Disposition:   FU one year  Signed, Tracy Mocha, MD  10/27/2016 10:28 AM    [facility] [place], [place], [place]  [address] Phone: [phone]; Fax: [phone]

## 2016-10-31 ENCOUNTER — Telehealth: Payer: Self-pay | Admitting: Internal Medicine

## 2016-11-17 ENCOUNTER — Other Ambulatory Visit: Payer: Medicare Other | Admitting: *Deleted

## 2016-11-17 DIAGNOSIS — I10 Essential (primary) hypertension: Secondary | ICD-10-CM | POA: Diagnosis not present

## 2016-11-17 LAB — BASIC METABOLIC PANEL
BUN/Creatinine Ratio: 27 (ref 12–28)
BUN: 24 mg/dL (ref 8–27)
CO2: 19 mmol/L (ref 18–29)
Calcium: 9.1 mg/dL (ref 8.7–10.3)
Chloride: 105 mmol/L (ref 96–106)
Creatinine, Ser: 0.9 mg/dL (ref 0.57–1.00)
GFR calc Af Amer: 76 mL/min/{1.73_m2} (ref >59)
GFR calc non Af Amer: 66 mL/min/{1.73_m2} (ref >59)
Glucose: 92 mg/dL (ref 65–99)
Potassium: 4.6 mmol/L (ref 3.5–5.2)
Sodium: 143 mmol/L (ref 134–144)

## 2017-01-17 ENCOUNTER — Other Ambulatory Visit: Payer: Self-pay | Admitting: Internal Medicine

## 2017-01-23 ENCOUNTER — Other Ambulatory Visit: Payer: Self-pay | Admitting: Internal Medicine

## 2017-02-01 DIAGNOSIS — H2513 Age-related nuclear cataract, bilateral: Secondary | ICD-10-CM | POA: Insufficient documentation

## 2017-02-28 NOTE — Telephone Encounter (Signed)
Left message for her to call us back to schedule awv.

## 2017-03-05 ENCOUNTER — Other Ambulatory Visit: Payer: Self-pay | Admitting: Internal Medicine

## 2017-03-21 DIAGNOSIS — H2512 Age-related nuclear cataract, left eye: Secondary | ICD-10-CM | POA: Diagnosis not present

## 2017-03-21 DIAGNOSIS — H2513 Age-related nuclear cataract, bilateral: Secondary | ICD-10-CM | POA: Diagnosis not present

## 2017-03-23 ENCOUNTER — Other Ambulatory Visit: Payer: Self-pay | Admitting: Internal Medicine

## 2017-03-30 DIAGNOSIS — H2513 Age-related nuclear cataract, bilateral: Secondary | ICD-10-CM | POA: Diagnosis not present

## 2017-03-30 DIAGNOSIS — I252 Old myocardial infarction: Secondary | ICD-10-CM | POA: Diagnosis not present

## 2017-03-30 DIAGNOSIS — H25812 Combined forms of age-related cataract, left eye: Secondary | ICD-10-CM | POA: Diagnosis not present

## 2017-04-12 DIAGNOSIS — Z961 Presence of intraocular lens: Secondary | ICD-10-CM | POA: Diagnosis not present

## 2017-04-12 DIAGNOSIS — Z9842 Cataract extraction status, left eye: Secondary | ICD-10-CM | POA: Diagnosis not present

## 2017-04-12 DIAGNOSIS — H2511 Age-related nuclear cataract, right eye: Secondary | ICD-10-CM | POA: Diagnosis not present

## 2017-04-13 DIAGNOSIS — Z79891 Long term (current) use of opiate analgesic: Secondary | ICD-10-CM | POA: Diagnosis not present

## 2017-04-13 DIAGNOSIS — Z7982 Long term (current) use of aspirin: Secondary | ICD-10-CM | POA: Diagnosis not present

## 2017-04-13 DIAGNOSIS — I252 Old myocardial infarction: Secondary | ICD-10-CM | POA: Diagnosis not present

## 2017-04-13 DIAGNOSIS — M109 Gout, unspecified: Secondary | ICD-10-CM | POA: Diagnosis not present

## 2017-04-13 DIAGNOSIS — K219 Gastro-esophageal reflux disease without esophagitis: Secondary | ICD-10-CM | POA: Diagnosis not present

## 2017-04-13 DIAGNOSIS — H25811 Combined forms of age-related cataract, right eye: Secondary | ICD-10-CM | POA: Diagnosis not present

## 2017-04-13 DIAGNOSIS — E7800 Pure hypercholesterolemia, unspecified: Secondary | ICD-10-CM | POA: Diagnosis not present

## 2017-04-13 DIAGNOSIS — Z79899 Other long term (current) drug therapy: Secondary | ICD-10-CM | POA: Diagnosis not present

## 2017-04-13 DIAGNOSIS — I110 Hypertensive heart disease with heart failure: Secondary | ICD-10-CM | POA: Diagnosis not present

## 2017-04-13 DIAGNOSIS — I509 Heart failure, unspecified: Secondary | ICD-10-CM | POA: Diagnosis not present

## 2017-04-13 DIAGNOSIS — Z955 Presence of coronary angioplasty implant and graft: Secondary | ICD-10-CM | POA: Diagnosis not present

## 2017-04-13 DIAGNOSIS — Z882 Allergy status to sulfonamides status: Secondary | ICD-10-CM | POA: Diagnosis not present

## 2017-04-13 DIAGNOSIS — E785 Hyperlipidemia, unspecified: Secondary | ICD-10-CM | POA: Diagnosis not present

## 2017-04-13 DIAGNOSIS — Z881 Allergy status to other antibiotic agents status: Secondary | ICD-10-CM | POA: Diagnosis not present

## 2017-04-13 DIAGNOSIS — F419 Anxiety disorder, unspecified: Secondary | ICD-10-CM | POA: Diagnosis not present

## 2017-06-20 ENCOUNTER — Other Ambulatory Visit: Payer: Self-pay | Admitting: Internal Medicine

## 2017-07-06 ENCOUNTER — Encounter: Payer: Self-pay | Admitting: Internal Medicine

## 2017-07-13 ENCOUNTER — Other Ambulatory Visit: Payer: Self-pay | Admitting: Internal Medicine

## 2017-07-13 MED ORDER — ALLOPURINOL 100 MG PO TABS: 100.0000 mg | ORAL_TABLET | Freq: Every day | ORAL | 0 refills | Status: DC

## 2017-07-18 ENCOUNTER — Other Ambulatory Visit: Payer: Self-pay | Admitting: Internal Medicine

## 2017-07-25 ENCOUNTER — Encounter: Payer: Self-pay | Admitting: Internal Medicine

## 2017-07-27 ENCOUNTER — Other Ambulatory Visit: Payer: Self-pay | Admitting: Internal Medicine

## 2017-07-31 ENCOUNTER — Encounter: Payer: Self-pay | Admitting: Internal Medicine

## 2017-07-31 ENCOUNTER — Ambulatory Visit (INDEPENDENT_AMBULATORY_CARE_PROVIDER_SITE_OTHER): Payer: Medicare Other | Admitting: Internal Medicine

## 2017-07-31 VITALS — BP 120/70 | HR 86 | Temp 98.1°F | Ht 64.5 in | Wt 200.8 lb

## 2017-07-31 DIAGNOSIS — E7800 Pure hypercholesterolemia, unspecified: Secondary | ICD-10-CM

## 2017-07-31 DIAGNOSIS — Z23 Encounter for immunization: Secondary | ICD-10-CM | POA: Diagnosis not present

## 2017-07-31 DIAGNOSIS — I2510 Atherosclerotic heart disease of native coronary artery without angina pectoris: Secondary | ICD-10-CM | POA: Diagnosis not present

## 2017-07-31 DIAGNOSIS — R0609 Other forms of dyspnea: Secondary | ICD-10-CM

## 2017-07-31 DIAGNOSIS — I519 Heart disease, unspecified: Secondary | ICD-10-CM | POA: Diagnosis not present

## 2017-07-31 DIAGNOSIS — R0600 Dyspnea, unspecified: Secondary | ICD-10-CM

## 2017-07-31 DIAGNOSIS — I10 Essential (primary) hypertension: Secondary | ICD-10-CM

## 2017-07-31 MED ORDER — FUROSEMIDE 20 MG PO TABS
40.0000 mg | ORAL_TABLET | Freq: Every day | ORAL | 3 refills | Status: DC
Start: 2017-07-31 — End: 2017-09-04

## 2017-07-31 MED ORDER — HYDROCODONE-ACETAMINOPHEN 5-325 MG PO TABS: ORAL_TABLET | ORAL | 0 refills | Status: DC

## 2017-07-31 NOTE — Patient Instructions (Addendum)
Increase furosemide to 40 mg daily  Limit your sodium (Salt) intake  Return in 2 weeks  for follow-up  Report any worsening shortness of breath, swelling or weight gain  Weigh  yourself daily  Schedule cardiology follow-up

## 2017-07-31 NOTE — Progress Notes (Signed)
Subjective:    Patient ID: Tracy [name], female    DOB: Jul 18, 1946, 71 y.o.   MRN: [id number]  HPI  71 year old patient who is seen today for pain management evaluation per opioid protocol  Ragan printed, initialed  and scanned into the EMR. New pain contract reviewed and signed  Indication for chronic opioid: patient takes pain medication periodically for low back pain as well as osteoarthritic pain involving the small joints of the hands Medication and dose: hydrocodone 5-acetaminophen 325 # pills per month: variable; patient's last prescription lasted 10 months.  Does not take daily Last UDS date: 07/31/2017 Pain contract signed (Y/N): yes Date narcotic database last reviewed (include red flags): 07/31/2017  Patient has a history of coronary artery disease as well as left ventricular dysfunction.  For the past several days she has had increasing dyspnea on exertion as well as a sense of weight gain and feeling puffy.  Wt Readings from Last 3 Encounters:  07/31/17 200 lb 12.8 oz (91.1 kg)  10/27/16 184 lb (83.5 kg)  10/18/16 184 lb 4 oz (83.6 kg)   2 days ago, [place] and a considerable difficulty with DOE, requiring frequent periods of rest.  She failed to take Lasix that day due to activities.  No PND or orthopnea She is followed by cardiology  Past Medical History:  Diagnosis Date  . Allergic rhinitis   . Headache(784.0)   . Hyperlipidemia   . Hypertension   . Low back pain   . Menopausal syndrome   . Myocardial infarct (HCC)    hx of  . Overactive bladder      Social History   Social History  . Marital status: Married    Spouse name: N/A  . Number of children: N/A  . Years of education: N/A   Occupational History  . 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 Smoking status: Former Smoker    Quit date: 07/01/2012  . Smokeless tobacco: Never Used     Comment: electronic cigarettes  . Alcohol use  No  . Drug use: No  . Sexual activity: Not on file   Other Topics Concern  . Not on file   Social History Narrative  . No narrative on file    Past Surgical History:  Procedure Laterality Date  . ANGIOPLASTY     stent 2007  . TONSILLECTOMY      Family History  Problem Relation Age of Onset  . Adopted: Yes  . Other Unknown        patient is adopted    Allergies  Allergen Reactions  . Sulfamethoxazole     REACTION: unspecified  . Erythromycin Rash    But isn't certain    Current Outpatient Prescriptions on File Prior to Visit  Medication Sig Dispense Refill  . allopurinol (ZYLOPRIM) 100 MG tablet Take 1 tablet (100 mg total) by mouth daily. 90 tablet 0  . ALPRAZolam (XANAX) 0.25 MG tablet TAKE 1 TABLET BY MOUTH TWICE A DAY AS NEEDED FOR ANXIETY 60 tablet 5  . amLODipine (NORVASC) 2.5 MG tablet TAKE 1 TABLET BY MOUTH DAILY. 30 tablet 3  . Ascorbic Acid (VITAMIN C) 1000 MG tablet Take 1,000 mg by mouth daily.    Marland Kitchen aspirin 81 MG tablet Take 81 mg by mouth daily.      Marland Kitchen atorvastatin (LIPITOR) 40 MG tablet TAKE 1 TABLET BY MOUTH DAILY. 90 tablet 3  .  cetirizine (ZYRTEC) 10 MG tablet Take 10 mg by mouth daily.    . Coenzyme Q10 (CO Q 10 PO) Take 300 mg by mouth daily.    . Cyanocobalamin 5000 MCG CAPS Take 1 capsule by mouth daily.    Marland Kitchen losartan (COZAAR) 100 MG tablet TAKE 1 TABLET (100 MG TOTAL) BY MOUTH DAILY. 90 tablet 3  . metoprolol (LOPRESSOR) 100 MG tablet TAKE 1 TABLET (100 MG TOTAL) BY MOUTH 2 (TWO) TIMES DAILY. 180 tablet 1  . Misc Natural Products (TART CHERRY ADVANCED) CAPS Take 1 capsule by mouth daily.    . nitroGLYCERIN (NITROSTAT) 0.4 MG SL tablet Place 0.4 mg under the tongue every 5 (five) minutes as needed for chest pain.    . pantoprazole (PROTONIX) 40 MG tablet TAKE 1 TABLET (40 MG TOTAL) BY MOUTH DAILY. 90 tablet 1   No current facility-administered medications on file prior to visit.     BP 120/70 (BP Location: Left Arm, Patient Position: Sitting,  Cuff Size: Normal)   Pulse 86   Temp 98.1 F (36.7 C) (Oral)   Ht 5' 4.5" (1.638 m)   Wt 200 lb 12.8 oz (91.1 kg)   SpO2 93%   BMI 33.93 kg/m     Review of Systems  Constitutional: Positive for unexpected weight change.  HENT: Negative for congestion, dental problem, hearing loss, rhinorrhea, sinus pressure, sore throat and tinnitus.   Eyes: Negative for pain, discharge and visual disturbance.  Respiratory: Positive for shortness of breath. Negative for cough.   Cardiovascular: Positive for leg swelling. Negative for chest pain and palpitations.  Gastrointestinal: Negative for abdominal distention, abdominal pain, blood in stool, constipation, diarrhea, nausea and vomiting.  Genitourinary: Negative for difficulty urinating, dysuria, flank pain, frequency, hematuria, pelvic pain, urgency, vaginal bleeding, vaginal discharge and vaginal pain.  Musculoskeletal: Negative for arthralgias, gait problem and joint swelling.  Skin: Negative for rash.  Neurological: Negative for dizziness, syncope, speech difficulty, weakness, numbness and headaches.  Hematological: Negative for adenopathy.  Psychiatric/Behavioral: Negative for agitation, behavioral problems and dysphoric mood. The patient is not nervous/anxious.        Objective:   Physical Exam  Constitutional: She is oriented to person, place, and time. She appears well-developed and well-nourished.  Blood pressure well controlled Weight 200  HENT:  Head: Normocephalic.  Right Ear: External ear normal.  Left Ear: External ear normal.  Mouth/Throat: Oropharynx is clear and moist.  Eyes: Pupils are equal, round, and reactive to light. Conjunctivae and EOM are normal.  Neck: Normal range of motion. Neck supple. No JVD present. No thyromegaly present.  Cardiovascular: Normal rate, regular rhythm, normal heart sounds and intact distal pulses.   Pulmonary/Chest: Effort normal. She has rales.  Abdominal: Soft. Bowel sounds are normal. She  exhibits no mass. There is no tenderness.  Musculoskeletal: Normal range of motion.  Trace ankle edema  Lymphadenopathy:    She has no cervical adenopathy.  Neurological: She is alert and oriented to person, place, and time.  Skin: Skin is warm and dry. No rash noted.  Psychiatric: She has a normal mood and affect. Her behavior is normal.          Assessment & Plan:   Coronary artery disease with left ventricular dysfunction.  Mild fluid overload.  Will stress.  Low-salt diet.  Check lab including BNP.  Increase furosemide to 40 mg daily Recheck 2 weeks Cardiology follow-up  Encounter for chronic pain management (G89.29) Narcotic use  (711.90) Pain management contract signed (Z02.89)  Nyoka Cowden

## 2017-08-01 LAB — COMPREHENSIVE METABOLIC PANEL
ALT: 37 U/L — ABNORMAL HIGH (ref 0–35)
AST: 37 U/L (ref 0–37)
Albumin: 3.9 g/dL (ref 3.5–5.2)
Alkaline Phosphatase: 76 U/L (ref 39–117)
BUN: 26 mg/dL — ABNORMAL HIGH (ref 6–23)
CO2: 25 mEq/L (ref 19–32)
Calcium: 9.3 mg/dL (ref 8.4–10.5)
Chloride: 105 mEq/L (ref 96–112)
Creatinine, Ser: 1.35 mg/dL — ABNORMAL HIGH (ref 0.40–1.20)
GFR: 41.29 mL/min — ABNORMAL LOW (ref >60.00)
Glucose, Bld: 102 mg/dL — ABNORMAL HIGH (ref 70–99)
Potassium: 4.6 mEq/L (ref 3.5–5.1)
Sodium: 142 mEq/L (ref 135–145)
Total Bilirubin: 0.4 mg/dL (ref 0.2–1.2)
Total Protein: 8.2 g/dL (ref 6.0–8.3)

## 2017-08-01 LAB — CBC WITH DIFFERENTIAL/PLATELET
Basophils Absolute: 0 10*3/uL (ref 0.0–0.1)
Basophils Relative: 0.4 % (ref 0.0–3.0)
Eosinophils Absolute: 0.3 10*3/uL (ref 0.0–0.7)
Eosinophils Relative: 2.7 % (ref 0.0–5.0)
HCT: 41.9 % (ref 36.0–46.0)
Hemoglobin: 13.4 g/dL (ref 12.0–15.0)
Lymphocytes Relative: 36.3 % (ref 12.0–46.0)
Lymphs Abs: 4 10*3/uL (ref 0.7–4.0)
MCHC: 31.8 g/dL (ref 30.0–36.0)
MCV: 91.6 fl (ref 78.0–100.0)
Monocytes Absolute: 1.2 10*3/uL — ABNORMAL HIGH (ref 0.1–1.0)
Monocytes Relative: 10.6 % (ref 3.0–12.0)
Neutro Abs: 5.5 10*3/uL (ref 1.4–7.7)
Neutrophils Relative %: 50 % (ref 43.0–77.0)
Platelets: 255 10*3/uL (ref 150.0–400.0)
RBC: 4.58 Mil/uL (ref 3.87–5.11)
RDW: 16.6 % — ABNORMAL HIGH (ref 11.5–15.5)
WBC: 11 10*3/uL — ABNORMAL HIGH (ref 4.0–10.5)

## 2017-08-01 LAB — BRAIN NATRIURETIC PEPTIDE: Pro B Natriuretic peptide (BNP): 130 pg/mL — ABNORMAL HIGH (ref 0.0–100.0)

## 2017-08-04 ENCOUNTER — Ambulatory Visit: Payer: No Typology Code available for payment source

## 2017-08-15 ENCOUNTER — Ambulatory Visit (INDEPENDENT_AMBULATORY_CARE_PROVIDER_SITE_OTHER): Payer: Medicare Other | Admitting: Internal Medicine

## 2017-08-15 ENCOUNTER — Encounter: Payer: Self-pay | Admitting: Internal Medicine

## 2017-08-15 VITALS — BP 128/84 | HR 79 | Temp 97.9°F | Ht 64.5 in | Wt 202.6 lb

## 2017-08-15 DIAGNOSIS — I2510 Atherosclerotic heart disease of native coronary artery without angina pectoris: Secondary | ICD-10-CM | POA: Diagnosis not present

## 2017-08-15 DIAGNOSIS — I519 Heart disease, unspecified: Secondary | ICD-10-CM

## 2017-08-15 LAB — BASIC METABOLIC PANEL
BUN: 37 mg/dL — ABNORMAL HIGH (ref 6–23)
CO2: 25 mEq/L (ref 19–32)
Calcium: 9.1 mg/dL (ref 8.4–10.5)
Chloride: 105 mEq/L (ref 96–112)
Creatinine, Ser: 1.4 mg/dL — ABNORMAL HIGH (ref 0.40–1.20)
GFR: 39.59 mL/min — ABNORMAL LOW (ref >60.00)
Glucose, Bld: 99 mg/dL (ref 70–99)
Potassium: 4.4 mEq/L (ref 3.5–5.1)
Sodium: 141 mEq/L (ref 135–145)

## 2017-08-15 NOTE — Progress Notes (Signed)
Subjective:    Patient ID: Tracy [name], female    DOB: 01-12-1946, 71 y.o.   MRN: [id number]  HPI  71 year old patient who has a history of coronary artery disease, prior MI and mild the LV dysfunction.  She was seen 2 weeks ago complaining of worsening DOE.  She also has some mild chest pain when she was [place].  She has had no recurrent chest pain. Laboratory studies revealed a normal BNP.  Furosemide dose was increased from 20-40 mg daily. She continued to have some mild shortness of breath.  She also states that she occasionally gets short of breath when she sleeps, but this sounds like a long-standing problem.  She does have a history of prior tobacco use and has been abstinent since May of this year.  No recurrent exertional chest pain.  She does describe some occasional "heart fluttering".  Past Medical History:  Diagnosis Date  . Allergic rhinitis   . Headache(784.0)   . Hyperlipidemia   . Hypertension   . Low back pain   . Menopausal syndrome   . Myocardial infarct (HCC)    hx of  . Overactive bladder      Social History   Social History  . Marital status: Married    Spouse name: N/A  . Number of children: N/A  . Years of education: N/A   Occupational History  . 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 Smoking status: Former Smoker    Quit date: 07/01/2012  . Smokeless tobacco: Never Used     Comment: electronic cigarettes  . Alcohol use No  . Drug use: No  . Sexual activity: Not on file   Other Topics Concern  . Not on file   Social History Narrative  . No narrative on file    Past Surgical History:  Procedure Laterality Date  . ANGIOPLASTY     stent 2007  . TONSILLECTOMY      Family History  Problem Relation Age of Onset  . Adopted: Yes  . Other Unknown        patient is adopted    Allergies  Allergen Reactions  . Sulfamethoxazole     REACTION: unspecified  . Erythromycin Rash   But isn't certain    Current Outpatient Prescriptions on File Prior to Visit  Medication Sig Dispense Refill  . allopurinol (ZYLOPRIM) 100 MG tablet Take 1 tablet (100 mg total) by mouth daily. 90 tablet 0  . ALPRAZolam (XANAX) 0.25 MG tablet TAKE 1 TABLET BY MOUTH TWICE A DAY AS NEEDED FOR ANXIETY 60 tablet 5  . amLODipine (NORVASC) 2.5 MG tablet TAKE 1 TABLET BY MOUTH DAILY. 30 tablet 3  . Ascorbic Acid (VITAMIN C) 1000 MG tablet Take 1,000 mg by mouth daily.    Marland Kitchen aspirin 81 MG tablet Take 81 mg by mouth daily.      Marland Kitchen atorvastatin (LIPITOR) 40 MG tablet TAKE 1 TABLET BY MOUTH DAILY. 90 tablet 3  . cetirizine (ZYRTEC) 10 MG tablet Take 10 mg by mouth daily.    . Coenzyme Q10 (CO Q 10 PO) Take 300 mg by mouth daily.    . Cyanocobalamin 5000 MCG CAPS Take 1 capsule by mouth daily.    . furosemide (LASIX) 20 MG tablet Take 2 tablets (40 mg total) by mouth daily. 189 tablet 3  . HYDROcodone-acetaminophen (NORCO/VICODIN) 5-325 MG tablet TAKE 1 TABLET EVERY 6 HOURS AS NEEDED  FOR PAIN 60 tablet 0  . losartan (COZAAR) 100 MG tablet TAKE 1 TABLET (100 MG TOTAL) BY MOUTH DAILY. 90 tablet 3  . metoprolol (LOPRESSOR) 100 MG tablet TAKE 1 TABLET (100 MG TOTAL) BY MOUTH 2 (TWO) TIMES DAILY. 180 tablet 1  . Misc Natural Products (TART CHERRY ADVANCED) CAPS Take 1 capsule by mouth daily.    . nitroGLYCERIN (NITROSTAT) 0.4 MG SL tablet Place 0.4 mg under the tongue every 5 (five) minutes as needed for chest pain.    . pantoprazole (PROTONIX) 40 MG tablet TAKE 1 TABLET (40 MG TOTAL) BY MOUTH DAILY. 90 tablet 1   No current facility-administered medications on file prior to visit.     BP 128/84 (BP Location: Left Arm, Patient Position: Sitting, Cuff Size: Normal)   Pulse 79   Temp 97.9 F (36.6 C) (Oral)   Ht 5' 4.5" (1.638 m)   Wt 202 lb 9.6 oz (91.9 kg)   SpO2 95%   BMI 34.24 kg/m     Review of Systems  Constitutional: Negative.   HENT: Negative for congestion, dental problem, hearing loss,  rhinorrhea, sinus pressure, sore throat and tinnitus.   Eyes: Negative for pain, discharge and visual disturbance.  Respiratory: Negative for cough and shortness of breath.   Cardiovascular: Positive for chest pain and leg swelling. Negative for palpitations.  Gastrointestinal: Negative for abdominal distention, abdominal pain, blood in stool, constipation, diarrhea, nausea and vomiting.  Genitourinary: Negative for difficulty urinating, dysuria, flank pain, frequency, hematuria, pelvic pain, urgency, vaginal bleeding, vaginal discharge and vaginal pain.  Musculoskeletal: Negative for arthralgias, gait problem and joint swelling.  Skin: Negative for rash.  Neurological: Negative for dizziness, syncope, speech difficulty, weakness, numbness and headaches.  Hematological: Negative for adenopathy.  Psychiatric/Behavioral: Negative for agitation, behavioral problems and dysphoric mood. The patient is not nervous/anxious.        Objective:   Physical Exam  Constitutional: She is oriented to person, place, and time. She appears well-developed and well-nourished.  Weight 197  HENT:  Head: Normocephalic.  Right Ear: External ear normal.  Left Ear: External ear normal.  Mouth/Throat: Oropharynx is clear and moist.  Eyes: Pupils are equal, round, and reactive to light. Conjunctivae and EOM are normal.  Neck: Normal range of motion. Neck supple. No JVD present. No thyromegaly present.  Cardiovascular: Normal rate, regular rhythm, normal heart sounds and intact distal pulses.   Pulmonary/Chest: Effort normal. She has rales.  A few bibasilar crackles  Abdominal: Soft. Bowel sounds are normal. She exhibits no mass. There is no tenderness.  Musculoskeletal: Normal range of motion. She exhibits no edema.  Lymphadenopathy:    She has no cervical adenopathy.  Neurological: She is alert and oriented to person, place, and time.  Skin: Skin is warm and dry. No rash noted.  Psychiatric: She has a normal  mood and affect. Her behavior is normal.          Assessment & Plan:   Coronary artery disease History of prior MI Mild LV dysfunction  Patient continues to have some mild DOE.  No recurrent chest pain.  She does feel slightly improved on increased furosemide dose .  Follow-up cardiology  Check BMP.  May need to decrease furosemide if bump in creatinine  Nyoka Cowden

## 2017-08-15 NOTE — Patient Instructions (Signed)
Cardiology follow-up as discussed  Limit your sodium (Salt) intake  Call or return to clinic prn if these symptoms worsen or fail to improve as anticipated.

## 2017-08-18 DIAGNOSIS — H26491 Other secondary cataract, right eye: Secondary | ICD-10-CM | POA: Diagnosis not present

## 2017-08-22 ENCOUNTER — Other Ambulatory Visit: Payer: Self-pay | Admitting: Internal Medicine

## 2017-08-22 ENCOUNTER — Other Ambulatory Visit: Payer: Self-pay | Admitting: Cardiovascular Disease

## 2017-08-24 ENCOUNTER — Other Ambulatory Visit: Payer: Self-pay | Admitting: Internal Medicine

## 2017-09-04 ENCOUNTER — Encounter: Payer: Self-pay | Admitting: Physician Assistant

## 2017-09-04 ENCOUNTER — Ambulatory Visit (INDEPENDENT_AMBULATORY_CARE_PROVIDER_SITE_OTHER): Payer: Medicare Other | Admitting: Physician Assistant

## 2017-09-04 VITALS — BP 140/82 | HR 80 | Ht 65.0 in | Wt 204.0 lb

## 2017-09-04 DIAGNOSIS — I10 Essential (primary) hypertension: Secondary | ICD-10-CM

## 2017-09-04 DIAGNOSIS — I5023 Acute on chronic systolic (congestive) heart failure: Secondary | ICD-10-CM

## 2017-09-04 DIAGNOSIS — I5032 Chronic diastolic (congestive) heart failure: Secondary | ICD-10-CM | POA: Insufficient documentation

## 2017-09-04 DIAGNOSIS — I519 Heart disease, unspecified: Secondary | ICD-10-CM | POA: Diagnosis not present

## 2017-09-04 DIAGNOSIS — I25119 Atherosclerotic heart disease of native coronary artery with unspecified angina pectoris: Secondary | ICD-10-CM

## 2017-09-04 DIAGNOSIS — I5042 Chronic combined systolic (congestive) and diastolic (congestive) heart failure: Secondary | ICD-10-CM | POA: Insufficient documentation

## 2017-09-04 DIAGNOSIS — E7800 Pure hypercholesterolemia, unspecified: Secondary | ICD-10-CM

## 2017-09-04 DIAGNOSIS — I209 Angina pectoris, unspecified: Secondary | ICD-10-CM

## 2017-09-04 LAB — BASIC METABOLIC PANEL
BUN/Creatinine Ratio: 24 (ref 12–28)
BUN: 26 mg/dL (ref 8–27)
CO2: 21 mmol/L (ref 20–29)
Calcium: 8.8 mg/dL (ref 8.7–10.3)
Chloride: 108 mmol/L — ABNORMAL HIGH (ref 96–106)
Creatinine, Ser: 1.1 mg/dL — ABNORMAL HIGH (ref 0.57–1.00)
GFR calc Af Amer: 59 mL/min/{1.73_m2} — ABNORMAL LOW (ref >59)
GFR calc non Af Amer: 51 mL/min/{1.73_m2} — ABNORMAL LOW (ref >59)
Glucose: 108 mg/dL — ABNORMAL HIGH (ref 65–99)
Potassium: 4.3 mmol/L (ref 3.5–5.2)
Sodium: 145 mmol/L — ABNORMAL HIGH (ref 134–144)

## 2017-09-04 LAB — PRO B NATRIURETIC PEPTIDE: NT-Pro BNP: 480 pg/mL — ABNORMAL HIGH (ref 0–301)

## 2017-09-04 MED ORDER — ISOSORBIDE MONONITRATE ER 30 MG PO TB24: 30.0000 mg | ORAL_TABLET | Freq: Every day | ORAL | 3 refills | Status: DC

## 2017-09-04 MED ORDER — POTASSIUM CHLORIDE CRYS ER 20 MEQ PO TBCR: 20.0000 meq | EXTENDED_RELEASE_TABLET | Freq: Every day | ORAL | 3 refills | Status: DC

## 2017-09-04 MED ORDER — FUROSEMIDE 40 MG PO TABS: 40.0000 mg | ORAL_TABLET | Freq: Every day | ORAL | 3 refills | Status: DC

## 2017-09-04 NOTE — Patient Instructions (Addendum)
Medication Instructions:  Your physician has recommended you make the following change in your medication:  1.  INCREASE the Lasix to 40 mg twice a day 2.  START Imdur 30 mg daily 3.  START Potassium 20 meq daily  Labwork: TODAY:  BMET & PRO BNP  2-3 WEEKS:  BMET   Testing/Procedures: Your physician has requested that you have an echocardiogram. Echocardiography is a painless test that uses sound waves to create images of your heart. It provides your doctor with information about the size and shape of your heart and how well your heart's chambers and valves are working. This procedure takes approximately one hour. There are no restrictions for this procedure.    Follow-Up: Your physician recommends that you schedule a follow-up appointment in: [place], PA-C  Your physician recommends that you schedule a follow-up appointment in: [place]    Any Other Special Instructions Will Be Listed Below (If Applicable).  Echocardiogram An echocardiogram, or echocardiography, uses sound waves (ultrasound) to produce an image of your heart. The echocardiogram is simple, painless, obtained within a short period of time, and offers valuable information to your health care provider. The images from an echocardiogram can provide information such as:  Evidence of coronary artery disease (CAD).  Heart size.  Heart muscle function.  Heart valve function.  Aneurysm detection.  Evidence of a past heart attack.  Fluid buildup around the heart.  Heart muscle thickening.  Assess heart valve function.  Tell a health care provider about:  Any allergies you have.  All medicines you are taking, including vitamins, herbs, eye drops, creams, and over-the-counter medicines.  Any problems you or family members have had with anesthetic medicines.  Any blood disorders you have.  Any surgeries you have had.  Any medical conditions you have.  Whether you  are pregnant or may be pregnant. What happens before the procedure? No special preparation is needed. Eat and drink normally. What happens during the procedure?  In order to produce an image of your heart, gel will be applied to your chest and a wand-like tool (transducer) will be moved over your chest. The gel will help transmit the sound waves from the transducer. The sound waves will harmlessly bounce off your heart to allow the heart images to be captured in real-time motion. These images will then be recorded.  You may need an IV to receive a medicine that improves the quality of the pictures. What happens after the procedure? You may return to your normal schedule including diet, activities, and medicines, unless your health care provider tells you otherwise. This information is not intended to replace advice given to you by your health care provider. Make sure you discuss any questions you have with your health care provider. Document Released: 09/30/2000 Document Revised: 05/21/2016 Document Reviewed: 06/10/2013 Elsevier Interactive Patient Education  2017 [employer].    If you need a refill on your cardiac medications before your next appointment, please call your pharmacy.

## 2017-09-04 NOTE — Progress Notes (Signed)
Cardiology Office Note    Date:  09/04/2017   ID:  Tracy [name], DOB 01/08/1946, MRN [id number]  PCP:  Marletta Lor, MD  Cardiologist: Dr. Burt Knack  Chief Complaint  Patient presents with  . Follow-up    History of Present Illness:  Tracy [name] is a 71 y.o. female who is being seen today for the evaluation of LV dysfunction at the request of Marletta Lor, MD.  Patient has a history of CAD status post MI treated with BMS to the circumflex in 2007.  She is also had episodes of musculoskeletal chest pain.  Nuclear stress test showed primarily fixed defect in the inferolateral and anterior lateral walls.  She has had mild LV dysfunction 45-50% 08/2016. Patient was last seen by Dr. Burt Knack 10/27/16.  She complained of chest pain along the sternum only when she pushes on her chest.  No anginal symptoms.  She was taking Lasix every other day at that time.  Patient recently had trouble with increased shortness of breath and edema.  She also had mild chest pain when she was visiting the zoo.  Lasix was increased from 20-40 mg daily by the primary care.  Creatinine went from 0.9-1.35-1.4 BNP was 130. Weight was 184 in January and now 204 lbs. Eats out a lot. She has chest tightness with little activity or with rushing around. Doesn't do much b/c of gout.    Past Medical History:  Diagnosis Date  . Allergic rhinitis   . Headache(784.0)   . Hyperlipidemia   . Hypertension   . Low back pain   . Menopausal syndrome   . Myocardial infarct (HCC)    hx of  . Overactive bladder     Past Surgical History:  Procedure Laterality Date  . ANGIOPLASTY     stent 2007  . TONSILLECTOMY      Current Medications: Current Meds  Medication Sig  . allopurinol (ZYLOPRIM) 100 MG tablet Take 1 tablet (100 mg total) by mouth daily.  Marland Kitchen ALPRAZolam (XANAX) 0.25 MG tablet TAKE 1 TABLET BY MOUTH TWICE A DAY AS NEEDED FOR ANXIETY  . amLODipine (NORVASC) 2.5 MG tablet TAKE 1  TABLET BY MOUTH DAILY.  Marland Kitchen Ascorbic Acid (VITAMIN C) 1000 MG tablet Take 1,000 mg by mouth daily.  Marland Kitchen aspirin 81 MG tablet Take 81 mg by mouth daily.    Marland Kitchen atorvastatin (LIPITOR) 40 MG tablet Take 1 tablet (40 mg total) daily by mouth. Please keep upcoming appt for future refills. Thanks  . cetirizine (ZYRTEC) 10 MG tablet Take 10 mg by mouth daily.  . Coenzyme Q10 (CO Q 10 PO) Take 300 mg by mouth daily.  . Cyanocobalamin 5000 MCG CAPS Take 1 capsule by mouth daily.  Marland Kitchen HYDROcodone-acetaminophen (NORCO/VICODIN) 5-325 MG tablet TAKE 1 TABLET EVERY 6 HOURS AS NEEDED FOR PAIN  . losartan (COZAAR) 100 MG tablet TAKE 1 TABLET (100 MG TOTAL) BY MOUTH DAILY.  . metoprolol tartrate (LOPRESSOR) 100 MG tablet TAKE 1 TABLET (100 MG TOTAL) BY MOUTH 2 (TWO) TIMES DAILY.  Marland Kitchen Misc Natural Products (TART CHERRY ADVANCED) CAPS Take 1 capsule by mouth daily.  . nitroGLYCERIN (NITROSTAT) 0.4 MG SL tablet Place 0.4 mg under the tongue every 5 (five) minutes as needed for chest pain.  . pantoprazole (PROTONIX) 40 MG tablet TAKE 1 TABLET (40 MG TOTAL) BY MOUTH DAILY.  . [DISCONTINUED] furosemide (LASIX) 20 MG tablet Take 2 tablets (40 mg total) by mouth daily.     Allergies:  Sulfamethoxazole and Erythromycin   Social History   Socioeconomic History  . Marital status: Married    Spouse name: None  . Number of children: None  . Years of education: None  . Highest education level: None  Social Needs  . Financial resource strain: None  . Food insecurity - worry: None  . Food insecurity - inability: None  . Transportation needs - medical: None  . Transportation needs - non-medical: None  Occupational History  . Occupation: direct Civil engineer, contracting  Tobacco Use  . Smoking status: Former Smoker    Last attempt to quit: 07/01/2012    Years since quitting: 5.1  . Smokeless tobacco: Never Used  . Tobacco comment: electronic cigarettes  Substance and Sexual Activity  . Alcohol use: No  . Drug use: No  .  Sexual activity: None  Other Topics Concern  . None  Social History Narrative  . None     Family History:  The patient's   family history includes Other in her unknown relative. She was adopted.   ROS:   Please see the history of present illness.    Review of Systems  Constitution: Positive for weight gain.  HENT: Negative.   Eyes: Negative.   Cardiovascular: Positive for chest pain, dyspnea on exertion and leg swelling.  Respiratory: Negative.   Hematologic/Lymphatic: Negative.   Musculoskeletal: Negative.  Negative for joint pain.  Gastrointestinal: Negative.   Genitourinary: Negative.   Neurological: Negative.    All other systems reviewed and are negative.   PHYSICAL EXAM:   VS:  BP 140/82   Pulse 80   Ht '5\' 5"'$  (1.651 m)   Wt 204 lb (92.5 kg)   BMI 33.95 kg/m   Physical Exam  GEN: Obese, in no acute distress  Neck: no JVD, carotid bruits, or masses Cardiac:RRR; no murmurs, rubs, or gallops  Respiratory: Decreased breath sounds with some scattered wheezing  GI: soft, nontender, nondistended, + BS Ext: Trace of edema bilaterally without cyanosis, clubbing, Good distal pulses bilaterally Neuro:  Alert and Oriented x 3,  Psych: euthymic mood, full affect  Wt Readings from Last 3 Encounters:  09/04/17 204 lb (92.5 kg)  08/15/17 202 lb 9.6 oz (91.9 kg)  07/31/17 200 lb 12.8 oz (91.1 kg)      Studies/Labs Reviewed:   EKG:  EKG is  ordered today.  The ekg ordered today demonstrates normal sinus rhythm, normal EKG  Recent Labs: 07/31/2017: ALT 37; Hemoglobin 13.4; Platelets 255.0; Pro B Natriuretic peptide (BNP) 130.0 08/15/2017: BUN 37; Creatinine, Ser 1.40; Potassium 4.4; Sodium 141   Lipid Panel    Component Value Date/Time   CHOL 154 09/13/2016 0738   TRIG 175 (H) 09/13/2016 0738   HDL 31 (L) 09/13/2016 0738   CHOLHDL 5.0 (H) 09/13/2016 0738   VLDL 35 (H) 09/13/2016 0738   LDLCALC 88 09/13/2016 0738   LDLDIRECT 111.0 01/05/2015 1019    Additional  studies/ records that were reviewed today include:  Nuclear stress test 12/2017Study Highlights      Nuclear stress EF: 45%.  There was no ST segment deviation noted during stress.  Findings consistent with prior myocardial infarction.  This is an intermediate risk study.  The left ventricular ejection fraction is mildly decreased (45-54%).   1. EF 45%, diffuse hypokinesis.  2. Fixed medium-sized, moderate intensity basal to mid inferolateral and anterolateral perfusion defect. This suggests prior infarction with minimal ischemia.  3. Overall intermediate risk study with low EF and perfusion defect,  but no ischemia noted.       2D echo 12/2017Study Conclusions   - Left ventricle: The cavity size was normal. There was moderate   focal basal hypertrophy of the septum. Systolic function was   mildly reduced. The estimated ejection fraction was in the range   of 45% to 50%. Mild hypokinesis of the anterolateral and   inferolateral myocardium. - Aortic valve: Transvalvular velocity was within the normal range.   There was no stenosis. There was mild regurgitation. - Mitral valve: Transvalvular velocity was within the normal range.   There was no evidence for stenosis. There was mild regurgitation. - Left atrium: The atrium was mildly dilated. - Right ventricle: The cavity size was normal. Wall thickness was   normal. Systolic function was normal. - Atrial septum: No defect or patent foramen ovale was identified   by color flow Doppler. - Tricuspid valve: There was no regurgitation. - Pulmonary arteries: Systolic pressure was within the normal   range. PA peak pressure: 24 mm Hg (S).   -------------------------------------------------------------------    ASSESSMENT:    1. Acute on chronic systolic CHF (congestive heart failure) ([place])   2. Left ventricular dysfunction   3. Coronary artery disease involving native coronary artery of native heart with angina pectoris ([place])     4. Essential hypertension   5. Pure hypercholesterolemia      PLAN:  In order of problems listed above:  Acute on chronic systolic CHF patient has mild edema but no overt CHF.  She has gained 20 pounds but not all of this is fluid.  Will check B NP and be met today.  Increase Lasix to 40 mg daily and add potassium 20 mEq daily.  2 g sodium diet discussed in detail.  No fast foods.  Follow-up with me or Dr. Burt Knack in 1 month.  LV dysfunction ejection fraction 45-50% on echo 09/2016  CAD status post MI treated with BMS to the circumflex in 2007.  Nuclear stress test was stable 10/14/2016.  Patient is having exertional angina.  She has not used nitroglycerin.  She is not very active.  Discussed this patient in detail with Dr. Burt Knack who recommends lifestyle change with weight loss program, add Imdur 30 mg once daily with early follow-up.  If no improvement will consider stress test versus cardiac catheterization.  Essential hypertension blood pressure up a little bit today.  Adding Imdur.  Pure hypercholesterolemia continue Lipitor.    Medication Adjustments/Labs and Tests Ordered: Current medicines are reviewed at length with the patient today.  Concerns regarding medicines are outlined above.  Medication changes, Labs and Tests ordered today are listed in the Patient Instructions below. Patient Instructions  Medication Instructions:  Your physician has recommended you make the following change in your medication:  1.  INCREASE the Lasix to 40 mg twice a day 2.  START Imdur 30 mg daily 3.  START Potassium 20 meq daily  Labwork: TODAY:  BMET & PRO BNP    Testing/Procedures: Your physician has requested that you have an echocardiogram. Echocardiography is a painless test that uses sound waves to create images of your heart. It provides your doctor with information about the size and shape of your heart and how well your heart's chambers and valves are working. This procedure takes  approximately one hour. There are no restrictions for this procedure.    Follow-Up: Your physician recommends that you schedule a follow-up appointment in:    Any Other Special Instructions Will Be Listed  Below (If Applicable).  Echocardiogram An echocardiogram, or echocardiography, uses sound waves (ultrasound) to produce an image of your heart. The echocardiogram is simple, painless, obtained within a short period of time, and offers valuable information to your health care provider. The images from an echocardiogram can provide information such as:  Evidence of coronary artery disease (CAD).  Heart size.  Heart muscle function.  Heart valve function.  Aneurysm detection.  Evidence of a past heart attack.  Fluid buildup around the heart.  Heart muscle thickening.  Assess heart valve function.  Tell a health care provider about:  Any allergies you have.  All medicines you are taking, including vitamins, herbs, eye drops, creams, and over-the-counter medicines.  Any problems you or family members have had with anesthetic medicines.  Any blood disorders you have.  Any surgeries you have had.  Any medical conditions you have.  Whether you are pregnant or may be pregnant. What happens before the procedure? No special preparation is needed. Eat and drink normally. What happens during the procedure?  In order to produce an image of your heart, gel will be applied to your chest and a wand-like tool (transducer) will be moved over your chest. The gel will help transmit the sound waves from the transducer. The sound waves will harmlessly bounce off your heart to allow the heart images to be captured in real-time motion. These images will then be recorded.  You may need an IV to receive a medicine that improves the quality of the pictures. What happens after the procedure? You may return to your normal schedule including diet, activities, and medicines, unless your health  care provider tells you otherwise. This information is not intended to replace advice given to you by your health care provider. Make sure you discuss any questions you have with your health care provider. Document Released: 09/30/2000 Document Revised: 05/21/2016 Document Reviewed: 06/10/2013 Elsevier Interactive Patient Education  2017 [employer].    If you need a refill on your cardiac medications before your next appointment, please call your pharmacy.      Sumner Boast, PA-C  09/04/2017 9:02 AM    [facility] [address], [place], [place]  [address] Phone: [phone]; Fax: [phone]

## 2017-09-05 ENCOUNTER — Telehealth: Payer: Self-pay | Admitting: Cardiovascular Disease

## 2017-09-05 NOTE — Telephone Encounter (Signed)
-----   Message from Imogene Burn, [place] sent at 09/05/2017 12:00 PM EST ----- Kidney function better, heart failure marker up some but we already increased lasix. No changes at this time

## 2017-09-05 NOTE — Telephone Encounter (Signed)
Tracy [name] is returning a call about her lab results . Please call

## 2017-09-05 NOTE — Telephone Encounter (Signed)
Returned pts call and she has been made aware of her lab results. See result not.

## 2017-09-12 ENCOUNTER — Other Ambulatory Visit: Payer: Self-pay

## 2017-09-12 ENCOUNTER — Ambulatory Visit (AMBULATORY_SURGERY_CENTER): Payer: Self-pay | Admitting: *Deleted

## 2017-09-12 VITALS — Ht 64.5 in | Wt 201.0 lb

## 2017-09-12 DIAGNOSIS — Z800 Family history of malignant neoplasm of digestive organs: Secondary | ICD-10-CM

## 2017-09-12 DIAGNOSIS — Z86010 Personal history of colonic polyps: Secondary | ICD-10-CM

## 2017-09-12 MED ORDER — NA SULFATE-K SULFATE-MG SULF 17.5-3.13-1.6 GM/177ML PO SOLN: ORAL | 0 refills | Status: DC

## 2017-09-12 NOTE — Progress Notes (Signed)
Patient denies any allergies to eggs or soy. Patient denies any problems with anesthesia/sedation. Patient denies any oxygen use at home. Patient denies taking any diet/weight loss medications or blood thinners. EMMI education assisgned to patient on colonoscopy, this was explained and instructions given to patient. 

## 2017-09-26 ENCOUNTER — Encounter: Payer: Medicare Other | Admitting: Internal Medicine

## 2017-09-27 ENCOUNTER — Telehealth: Payer: Self-pay | Admitting: Internal Medicine

## 2017-09-27 ENCOUNTER — Other Ambulatory Visit (HOSPITAL_COMMUNITY): Payer: Medicare Other

## 2017-09-27 ENCOUNTER — Encounter: Payer: Self-pay | Admitting: Physician Assistant

## 2017-09-27 ENCOUNTER — Other Ambulatory Visit: Payer: Medicare Other

## 2017-09-27 NOTE — Telephone Encounter (Signed)
No charge. 

## 2017-09-28 ENCOUNTER — Encounter: Payer: Medicare Other | Admitting: Internal Medicine

## 2017-09-29 ENCOUNTER — Other Ambulatory Visit: Payer: Self-pay

## 2017-09-29 ENCOUNTER — Other Ambulatory Visit: Payer: Medicare Other | Admitting: *Deleted

## 2017-09-29 ENCOUNTER — Ambulatory Visit (HOSPITAL_COMMUNITY): Payer: Medicare Other | Attending: Physician Assistant

## 2017-09-29 DIAGNOSIS — I25119 Atherosclerotic heart disease of native coronary artery with unspecified angina pectoris: Secondary | ICD-10-CM | POA: Diagnosis not present

## 2017-09-29 DIAGNOSIS — I110 Hypertensive heart disease with heart failure: Secondary | ICD-10-CM | POA: Diagnosis not present

## 2017-09-29 DIAGNOSIS — I519 Heart disease, unspecified: Secondary | ICD-10-CM | POA: Diagnosis not present

## 2017-09-29 DIAGNOSIS — I5023 Acute on chronic systolic (congestive) heart failure: Secondary | ICD-10-CM

## 2017-09-29 DIAGNOSIS — Z87891 Personal history of nicotine dependence: Secondary | ICD-10-CM | POA: Diagnosis not present

## 2017-09-29 DIAGNOSIS — I10 Essential (primary) hypertension: Secondary | ICD-10-CM

## 2017-09-29 DIAGNOSIS — E7800 Pure hypercholesterolemia, unspecified: Secondary | ICD-10-CM

## 2017-09-29 DIAGNOSIS — I252 Old myocardial infarction: Secondary | ICD-10-CM | POA: Diagnosis not present

## 2017-09-29 LAB — BASIC METABOLIC PANEL
BUN/Creatinine Ratio: 21 (ref 12–28)
BUN: 31 mg/dL — ABNORMAL HIGH (ref 8–27)
CO2: 23 mmol/L (ref 20–29)
Calcium: 9.3 mg/dL (ref 8.7–10.3)
Chloride: 105 mmol/L (ref 96–106)
Creatinine, Ser: 1.49 mg/dL — ABNORMAL HIGH (ref 0.57–1.00)
GFR calc Af Amer: 41 mL/min/{1.73_m2} — ABNORMAL LOW (ref >59)
GFR calc non Af Amer: 36 mL/min/{1.73_m2} — ABNORMAL LOW (ref >59)
Glucose: 101 mg/dL — ABNORMAL HIGH (ref 65–99)
Potassium: 5.4 mmol/L — ABNORMAL HIGH (ref 3.5–5.2)
Sodium: 143 mmol/L (ref 134–144)

## 2017-10-02 ENCOUNTER — Telehealth: Payer: Self-pay | Admitting: Cardiovascular Disease

## 2017-10-02 NOTE — Telephone Encounter (Signed)
-----   Message from Imogene Burn, PA-C sent at 10/02/2017  7:56 AM EST ----- Heart function normal. Heart has trouble relaxing. Avoid salt

## 2017-10-02 NOTE — Telephone Encounter (Signed)
Follow Up ° ° ° °Returning call from earlier. Please call. °

## 2017-10-02 NOTE — Telephone Encounter (Signed)
Returned pts call and we discussed her echo/lab results. See result note.

## 2017-10-03 ENCOUNTER — Encounter: Payer: Self-pay | Admitting: Physician Assistant

## 2017-10-03 ENCOUNTER — Telehealth (HOSPITAL_COMMUNITY): Payer: Self-pay | Admitting: *Deleted

## 2017-10-03 ENCOUNTER — Ambulatory Visit (INDEPENDENT_AMBULATORY_CARE_PROVIDER_SITE_OTHER): Payer: Medicare Other | Admitting: Physician Assistant

## 2017-10-03 VITALS — BP 100/60 | HR 80 | Ht 64.5 in | Wt 206.0 lb

## 2017-10-03 DIAGNOSIS — I5032 Chronic diastolic (congestive) heart failure: Secondary | ICD-10-CM | POA: Insufficient documentation

## 2017-10-03 DIAGNOSIS — I10 Essential (primary) hypertension: Secondary | ICD-10-CM | POA: Diagnosis not present

## 2017-10-03 DIAGNOSIS — I25119 Atherosclerotic heart disease of native coronary artery with unspecified angina pectoris: Secondary | ICD-10-CM

## 2017-10-03 DIAGNOSIS — E7800 Pure hypercholesterolemia, unspecified: Secondary | ICD-10-CM

## 2017-10-03 DIAGNOSIS — R079 Chest pain, unspecified: Secondary | ICD-10-CM | POA: Diagnosis not present

## 2017-10-03 DIAGNOSIS — I209 Angina pectoris, unspecified: Secondary | ICD-10-CM

## 2017-10-03 LAB — BASIC METABOLIC PANEL
BUN/Creatinine Ratio: 25 (ref 12–28)
BUN: 33 mg/dL — ABNORMAL HIGH (ref 8–27)
CO2: 22 mmol/L (ref 20–29)
Calcium: 9 mg/dL (ref 8.7–10.3)
Chloride: 101 mmol/L (ref 96–106)
Creatinine, Ser: 1.31 mg/dL — ABNORMAL HIGH (ref 0.57–1.00)
GFR calc Af Amer: 48 mL/min/{1.73_m2} — ABNORMAL LOW (ref >59)
GFR calc non Af Amer: 42 mL/min/{1.73_m2} — ABNORMAL LOW (ref >59)
Glucose: 105 mg/dL — ABNORMAL HIGH (ref 65–99)
Potassium: 4.7 mmol/L (ref 3.5–5.2)
Sodium: 139 mmol/L (ref 134–144)

## 2017-10-03 NOTE — Progress Notes (Signed)
Cardiology Office Note    Date:  10/03/2017   ID:  Tracy [name], DOB January 18, 1946, MRN [id number]  PCP:  Marletta Lor, MD  Cardiologist: Dr. Burt Knack  Chief Complaint  Patient presents with  . Follow-up    History of Present Illness:  Tracy [name] is a 71 y.o. female witha history of CAD status post MI treated with BMS to the circumflex in 2007.  She is also had episodes of musculoskeletal chest pain.  Nuclear stress test showed primarily fixed defect in the inferolateral and anterior lateral walls.  She has had mild LV dysfunction 45-50% 08/2016. Patient was last seen by Dr. Burt Knack 10/27/16.  She complained of chest pain along the sternum only when she pushes on her chest.  No anginal symptoms.  She was taking Lasix every other day at that time.  I saw the patient 09/04/17 with worsening shortness of breath and edema.  Also had mild chest pain while visiting the zoo.  Lasix had already been increased by primary care from 20-40 mg daily.  Creatinine went up from 0.9-1.24.  BNP was 130.  She was up 20 pounds since January.  She was eating out a lot.  She did not have overt heart failure on exam but mild edema.  2D echo 09/29/17 showed normal LV function ejection fraction 50-55% with grade 1 DD.  I had increased her Lasix and add potassium but be met 09/29/17 potassium was up to 5.4 and creatinine 1.49.  2 g sodium diet was reiterated.  Patient comes in today for follow-up.  She is actually gained 2 more pounds.  She gets short of breath with little activity.  She continues to eat out almost daily.  She went out for burgers last night and had bacon and [place] for breakfast.  She is also having some discomfort in her chest when she exerts herself or when she presses on her chest.       Past Medical History:  Diagnosis Date  . Allergic rhinitis   . CHF (congestive heart failure) ([place])   . Gout   . Headache(784.0)   . Hyperlipidemia   . Hypertension   . Low back pain    . Menopausal syndrome   . Myocardial infarct ([place]) 2007   hx of  . Overactive bladder     Past Surgical History:  Procedure Laterality Date  . ANGIOPLASTY     stent 2007  . COLONOSCOPY    . TONSILLECTOMY      Current Medications: Current Meds  Medication Sig  . allopurinol (ZYLOPRIM) 100 MG tablet Take 1 tablet (100 mg total) by mouth daily.  Marland Kitchen ALPRAZolam (XANAX) 0.25 MG tablet TAKE 1 TABLET BY MOUTH TWICE A DAY AS NEEDED FOR ANXIETY  . amLODipine (NORVASC) 2.5 MG tablet TAKE 1 TABLET BY MOUTH DAILY.  Marland Kitchen Ascorbic Acid (VITAMIN C) 1000 MG tablet Take 1,000 mg by mouth daily.  Marland Kitchen aspirin 81 MG tablet Take 81 mg by mouth daily.    Marland Kitchen atorvastatin (LIPITOR) 40 MG tablet Take 1 tablet (40 mg total) daily by mouth. Please keep upcoming appt for future refills. Thanks  . calcium carbonate (CALCIUM 600) 600 MG TABS tablet Take 600 mg by mouth daily with breakfast.  . cetirizine (ZYRTEC) 10 MG tablet Take 10 mg by mouth daily.  . Cholecalciferol (VITAMIN D3 PO) Take 1,000 Units by mouth daily.  . Coenzyme Q10 (CO Q 10 PO) Take 300 mg by mouth daily.  . Cyanocobalamin 5000  MCG CAPS Take 1 capsule by mouth daily.  . furosemide (LASIX) 40 MG tablet Take 1 tablet (40 mg total) daily by mouth.  Marland Kitchen HYDROcodone-acetaminophen (NORCO/VICODIN) 5-325 MG tablet TAKE 1 TABLET EVERY 6 HOURS AS NEEDED FOR PAIN  . isosorbide mononitrate (IMDUR) 30 MG 24 hr tablet Take 1 tablet (30 mg total) daily by mouth.  . losartan (COZAAR) 100 MG tablet TAKE 1 TABLET (100 MG TOTAL) BY MOUTH DAILY.  . metoprolol tartrate (LOPRESSOR) 100 MG tablet TAKE 1 TABLET (100 MG TOTAL) BY MOUTH 2 (TWO) TIMES DAILY.  Marland Kitchen Misc Natural Products (TART CHERRY ADVANCED) CAPS Take 1 capsule by mouth daily.  . Na Sulfate-K Sulfate-Mg Sulf 17.5-3.13-1.6 GM/177ML SOLN Suprep (no substitutions)-TAKE AS DIRECTED.  Marland Kitchen nitroGLYCERIN (NITROSTAT) 0.4 MG SL tablet Place 0.4 mg under the tongue every 5 (five) minutes as needed for chest pain.  .  Omega-3 Fatty Acids (FISH OIL OMEGA-3 PO) Take 1 capsule by mouth daily.  . pantoprazole (PROTONIX) 40 MG tablet TAKE 1 TABLET (40 MG TOTAL) BY MOUTH DAILY.     Allergies:   Erythromycin and Sulfamethoxazole   Social History   Socioeconomic History  . Marital status: Married    Spouse name: None  . Number of children: None  . Years of education: None  . Highest education level: None  Social Needs  . Financial resource strain: None  . Food insecurity - worry: None  . Food insecurity - inability: None  . Transportation needs - medical: None  . Transportation needs - non-medical: None  Occupational History  . Occupation: direct Civil engineer, contracting  Tobacco Use  . Smoking status: Former Smoker    Last attempt to quit: 07/01/2012    Years since quitting: 5.2  . Smokeless tobacco: Never Used  Substance and Sexual Activity  . Alcohol use: No  . Drug use: No  . Sexual activity: None  Other Topics Concern  . None  Social History Narrative  . None     Family History:  The patient's family history includes Colon cancer (age of onset: 16) in her mother; Other in her unknown relative. She was adopted.   ROS:   Please see the history of present illness.    Review of Systems  Constitution: Positive for weight gain.  HENT: Negative.   Eyes: Negative.   Cardiovascular: Positive for chest pain and dyspnea on exertion.  Respiratory: Positive for shortness of breath.   Hematologic/Lymphatic: Negative.   Musculoskeletal: Positive for arthritis. Negative for joint pain.  Gastrointestinal: Negative.   Genitourinary: Negative.   Neurological: Negative.    All other systems reviewed and are negative.   PHYSICAL EXAM:   VS:  BP 100/60   Pulse 80   Ht 5' 4.5" (1.638 m)   Wt 206 lb (93.4 kg)   SpO2 94%   BMI 34.81 kg/m   Physical Exam  GEN: Obese, in no acute distress  Neck: no JVD, carotid bruits, or masses Cardiac:RRR; no murmurs, rubs, or gallops  Respiratory:  clear to  auscultation bilaterally, normal work of breathing GI: soft, nontender, nondistended, + BS Ext: +1-2 edema bilaterally without cyanosis, clubbing,  Good distal pulses bilaterally Neuro:  Alert and Oriented x 3, Psych: euthymic mood, full affect  Wt Readings from Last 3 Encounters:  10/03/17 206 lb (93.4 kg)  09/12/17 201 lb (91.2 kg)  09/04/17 204 lb (92.5 kg)      Studies/Labs Reviewed:   EKG:  EKG is  ordered today.  Recent Labs: 07/31/2017: ALT 37; Hemoglobin 13.4; Platelets 255.0 09/04/2017: NT-Pro BNP 480 09/29/2017: BUN 31; Creatinine, Ser 1.49; Potassium 5.4; Sodium 143   Lipid Panel    Component Value Date/Time   CHOL 154 09/13/2016 0738   TRIG 175 (H) 09/13/2016 0738   HDL 31 (L) 09/13/2016 0738   CHOLHDL 5.0 (H) 09/13/2016 0738   VLDL 35 (H) 09/13/2016 0738   LDLCALC 88 09/13/2016 0738   LDLDIRECT 111.0 01/05/2015 1019    Additional studies/ records that were reviewed today include:  2D echo 12/14/18Study Conclusions   - Left ventricle: The cavity size was normal. Wall thickness was   increased in a pattern of mild LVH. Systolic function was normal.   The estimated ejection fraction was in the range of 50% to 55%.   Wall motion was normal; there were no regional wall motion   abnormalities. Doppler parameters are consistent with abnormal   left ventricular relaxation (grade 1 diastolic dysfunction).    Lexiscan 12/2017Nuclear stress EF: 45%.  There was no ST segment deviation noted during stress.  Findings consistent with prior myocardial infarction.  This is an intermediate risk study.  The left ventricular ejection fraction is mildly decreased (45-54%).   1. EF 45%, diffuse hypokinesis.  2. Fixed medium-sized, moderate intensity basal to mid inferolateral and anterolateral perfusion defect. This suggests prior infarction with minimal ischemia.  3. Overall intermediate risk study with low EF and perfusion defect, but no ischemia noted.         ASSESSMENT:    1. Chronic diastolic CHF (congestive heart failure) ([place])   2. Essential hypertension   3. Coronary artery disease involving native coronary artery of native heart with angina pectoris ([place])   4. Pure hypercholesterolemia   5. Chest pain, unspecified type      PLAN:  In order of problems listed above:  Chronic diastolic CHF 2D echo actually showed improvement in systolic function ejection fraction 50-55% with grade 1 DD.  Patient's BNP was 480 at last office visit.  Lasix was increased to 40 mg daily.  Creatinine went up to 1.49.  Patient needs to really lower the sodium in her diet another long discussion with her about this.  Reluctant to increase Lasix with her creatinine climbing.  Will check labs today and then decide on medication changes.  Blood pressure is on the low side today.  We will either stop amlodipine as it may be contributing to her ankle edema or decrease losartan because of rising creatinine.  Essential hypertension patient's blood pressure is low today.  Has been running quite high in the past.  We will make an adjustment in her medication after I get her labs back.  CAD status post BMS to the circumflex in 2007.  Long history of musculoskeletal chest pain.  Nuclear stress test showed primarily fixed defect in the inferolateral and anterolateral walls in 08/2016.  Patient still complaining of chest pain and some dyspnea on exertion.  Will check Lexiscan Myoview.  Hypercholesterolemia on Lipitor managed by primary care  Medication Adjustments/Labs and Tests Ordered: Current medicines are reviewed at length with the patient today.  Concerns regarding medicines are outlined above.  Medication changes, Labs and Tests ordered today are listed in the Patient Instructions below. There are no Patient Instructions on file for this visit.   Sumner Boast, PA-C  10/03/2017 10:08 AM    [facility] [address],  [place], [place]  [address] Phone: [phone]; Fax: [phone]    

## 2017-10-03 NOTE — Telephone Encounter (Signed)
Patient given detailed instructions per Myocardial Perfusion Study Information Sheet for the test on 10/06/17 at 7:45. Patient notified to arrive 15 minutes early and that it is imperative to arrive on time for appointment to keep from having the test rescheduled.  If you need to cancel or reschedule your appointment, please call the office within 24 hours of your appointment. . Patient verbalized understanding.Tracy [name]

## 2017-10-03 NOTE — Patient Instructions (Signed)
Medication Instructions:  Your physician recommends that you continue on your current medications as directed. Please refer to the Current Medication list given to you today.   Labwork: Today: BMET  Testing/Procedures: Your physician has requested that you have a lexiscan myoview. For further information please visit [email or web address]. Please follow instruction sheet, as given.    Follow-Up: Your physician recommends that you schedule a follow-up appointment with Ermalinda Barrios PA-C first available after the first of the year   Any Other Special Instructions Will Be Listed Below (If Applicable).   If you need a refill on your cardiac medications before your next appointment, please call your pharmacy.   Low-Sodium Eating Plan Sodium, which is an element that makes up salt, helps you maintain a healthy balance of fluids in your body. Too much sodium can increase your blood pressure and cause fluid and waste to be held in your body. Your health care provider or dietitian may recommend following this plan if you have high blood pressure (hypertension), kidney disease, liver disease, or heart failure. Eating less sodium can help lower your blood pressure, reduce swelling, and protect your heart, liver, and kidneys. What are tips for following this plan? General guidelines  Most people on this plan should limit their sodium intake to 1,500-2,000 mg (milligrams) of sodium each day. Reading food labels  The Nutrition Facts label lists the amount of sodium in one serving of the food. If you eat more than one serving, you must multiply the listed amount of sodium by the number of servings.  Choose foods with less than 140 mg of sodium per serving.  Avoid foods with 300 mg of sodium or more per serving. Shopping  Look for lower-sodium products, often labeled as "low-sodium" or "no salt added."  Always check the sodium content even if foods are labeled as "unsalted" or "no salt  added".  Buy fresh foods. ? Avoid canned foods and premade or frozen meals. ? Avoid canned, cured, or processed meats  Buy breads that have less than 80 mg of sodium per slice. Cooking  Eat more home-cooked food and less restaurant, buffet, and fast food.  Avoid adding salt when cooking. Use salt-free seasonings or herbs instead of table salt or sea salt. Check with your health care provider or pharmacist before using salt substitutes.  Cook with plant-based oils, such as canola, sunflower, or olive oil. Meal planning  When eating at a restaurant, ask that your food be prepared with less salt or no salt, if possible.  Avoid foods that contain MSG (monosodium glutamate). MSG is sometimes added to [place] food, bouillon, and some canned foods. What foods are recommended? The items listed may not be a complete list. Talk with your dietitian about what dietary choices are best for you. Grains Low-sodium cereals, including oats, puffed wheat and rice, and shredded wheat. Low-sodium crackers. Unsalted rice. Unsalted pasta. Low-sodium bread. Whole-grain breads and whole-grain pasta. Vegetables Fresh or frozen vegetables. "No salt added" canned vegetables. "No salt added" tomato sauce and paste. Low-sodium or reduced-sodium tomato and vegetable juice. Fruits Fresh, frozen, or canned fruit. Fruit juice. Meats and other protein foods Fresh or frozen (no salt added) meat, poultry, seafood, and fish. Low-sodium canned tuna and salmon. Unsalted nuts. Dried peas, beans, and lentils without added salt. Unsalted canned beans. Eggs. Unsalted nut butters. Dairy Milk. Soy milk. Cheese that is naturally low in sodium, such as ricotta cheese, fresh mozzarella, or Swiss cheese Low-sodium or reduced-sodium cheese. Cream cheese. Yogurt. Fats and  oils Unsalted butter. Unsalted margarine with no trans fat. Vegetable oils such as canola or olive oils. Seasonings and other foods Fresh and dried herbs and  spices. Salt-free seasonings. Low-sodium mustard and ketchup. Sodium-free salad dressing. Sodium-free light mayonnaise. Fresh or refrigerated horseradish. Lemon juice. Vinegar. Homemade, reduced-sodium, or low-sodium soups. Unsalted popcorn and pretzels. Low-salt or salt-free chips. What foods are not recommended? The items listed may not be a complete list. Talk with your dietitian about what dietary choices are best for you. Grains Instant hot cereals. Bread stuffing, pancake, and biscuit mixes. Croutons. Seasoned rice or pasta mixes. Noodle soup cups. Boxed or frozen macaroni and cheese. Regular salted crackers. Self-rising flour. Vegetables Sauerkraut, pickled vegetables, and relishes. Olives. [place] fries. Onion rings. Regular canned vegetables (not low-sodium or reduced-sodium). Regular canned tomato sauce and paste (not low-sodium or reduced-sodium). Regular tomato and vegetable juice (not low-sodium or reduced-sodium). Frozen vegetables in sauces. Meats and other protein foods Meat or fish that is salted, canned, smoked, spiced, or pickled. Bacon, ham, sausage, hotdogs, corned beef, chipped beef, packaged lunch meats, salt pork, jerky, pickled herring, anchovies, regular canned tuna, sardines, salted nuts. Dairy Processed cheese and cheese spreads. Cheese curds. Blue cheese. Feta cheese. String cheese. Regular cottage cheese. Buttermilk. Canned milk. Fats and oils Salted butter. Regular margarine. Ghee. Bacon fat. Seasonings and other foods Onion salt, garlic salt, seasoned salt, table salt, and sea salt. Canned and packaged gravies. Worcestershire sauce. Tartar sauce. Barbecue sauce. Teriyaki sauce. Soy sauce, including reduced-sodium. Steak sauce. Fish sauce. Oyster sauce. Cocktail sauce. Horseradish that you find on the shelf. Regular ketchup and mustard. Meat flavorings and tenderizers. Bouillon cubes. Hot sauce and Tabasco sauce. Premade or packaged marinades. Premade or packaged taco  seasonings. Relishes. Regular salad dressings. Salsa. Potato and tortilla chips. Corn chips and puffs. Salted popcorn and pretzels. Canned or dried soups. Pizza. Frozen entrees and pot pies. Summary  Eating less sodium can help lower your blood pressure, reduce swelling, and protect your heart, liver, and kidneys.  Most people on this plan should limit their sodium intake to 1,500-2,000 mg (milligrams) of sodium each day.  Canned, boxed, and frozen foods are high in sodium. Restaurant foods, fast foods, and pizza are also very high in sodium. You also get sodium by adding salt to food.  Try to cook at home, eat more fresh fruits and vegetables, and eat less fast food, canned, processed, or prepared foods. This information is not intended to replace advice given to you by your health care provider. Make sure you discuss any questions you have with your health care provider. Document Released: 03/25/2002 Document Revised: 09/26/2016 Document Reviewed: 09/26/2016 Elsevier Interactive Patient Education  2017 [employer].

## 2017-10-04 ENCOUNTER — Telehealth: Payer: Self-pay | Admitting: *Deleted

## 2017-10-04 DIAGNOSIS — I5032 Chronic diastolic (congestive) heart failure: Secondary | ICD-10-CM

## 2017-10-04 NOTE — Telephone Encounter (Signed)
-----   Message from Imogene Burn, PA-C sent at 10/04/2017  8:30 AM EST ----- Kidney function better and potassium normal. Stop amlodipine. Can take Lasix 40 mg 2 tablets daily for 3 days then back to 40 mg once daily. Reiterate the importance of low sodium diet. Repeat bmet in 2-3 weeks.

## 2017-10-04 NOTE — Telephone Encounter (Signed)
Left message to go over lab results and recommendations.  

## 2017-10-06 ENCOUNTER — Ambulatory Visit (HOSPITAL_COMMUNITY): Payer: Medicare Other | Attending: Internal Medicine

## 2017-10-06 DIAGNOSIS — R0609 Other forms of dyspnea: Secondary | ICD-10-CM | POA: Diagnosis not present

## 2017-10-06 DIAGNOSIS — R9439 Abnormal result of other cardiovascular function study: Secondary | ICD-10-CM | POA: Diagnosis not present

## 2017-10-06 DIAGNOSIS — R079 Chest pain, unspecified: Secondary | ICD-10-CM | POA: Insufficient documentation

## 2017-10-06 DIAGNOSIS — I2510 Atherosclerotic heart disease of native coronary artery without angina pectoris: Secondary | ICD-10-CM | POA: Diagnosis not present

## 2017-10-06 DIAGNOSIS — I10 Essential (primary) hypertension: Secondary | ICD-10-CM | POA: Insufficient documentation

## 2017-10-06 MED ORDER — TECHNETIUM TC 99M TETROFOSMIN IV KIT
30.6000 | PACK | Freq: Once | INTRAVENOUS | Status: AC | PRN
  Administered 2017-10-06: 30.6 via INTRAVENOUS
  Filled 2017-10-06: qty 31

## 2017-10-12 ENCOUNTER — Other Ambulatory Visit: Payer: Self-pay | Admitting: Internal Medicine

## 2017-10-13 ENCOUNTER — Ambulatory Visit (HOSPITAL_COMMUNITY): Payer: Medicare Other | Attending: Internal Medicine

## 2017-10-13 DIAGNOSIS — R9439 Abnormal result of other cardiovascular function study: Secondary | ICD-10-CM | POA: Diagnosis not present

## 2017-10-13 DIAGNOSIS — I10 Essential (primary) hypertension: Secondary | ICD-10-CM | POA: Diagnosis not present

## 2017-10-13 DIAGNOSIS — R079 Chest pain, unspecified: Secondary | ICD-10-CM | POA: Diagnosis not present

## 2017-10-13 DIAGNOSIS — I2510 Atherosclerotic heart disease of native coronary artery without angina pectoris: Secondary | ICD-10-CM | POA: Diagnosis not present

## 2017-10-13 DIAGNOSIS — R0609 Other forms of dyspnea: Secondary | ICD-10-CM | POA: Diagnosis not present

## 2017-10-13 LAB — MYOCARDIAL PERFUSION IMAGING
LV dias vol: 108 mL (ref 46–106)
LV sys vol: 53 mL
Peak HR: 90 {beats}/min
RATE: 0.28
Rest HR: 71 {beats}/min
SDS: 5
SRS: 7
SSS: 11
TID: 0.91

## 2017-10-13 MED ORDER — TECHNETIUM TC 99M TETROFOSMIN IV KIT
31.8000 | PACK | Freq: Once | INTRAVENOUS | Status: AC | PRN
  Administered 2017-10-13: 31.8 via INTRAVENOUS
  Filled 2017-10-13: qty 32

## 2017-10-13 MED ORDER — REGADENOSON 0.4 MG/5ML IV SOLN
0.4000 mg | Freq: Once | INTRAVENOUS | Status: AC
  Administered 2017-10-13: 0.4 mg via INTRAVENOUS

## 2017-10-17 ENCOUNTER — Other Ambulatory Visit: Payer: Self-pay | Admitting: Internal Medicine

## 2017-10-19 NOTE — H&P (View-Only) (Signed)
Cardiology Office Note Date:  10/20/2017   ID:  JESUS POPLIN, DOB 17-Sep-1946, MRN [id number]  PCP:  Marletta Lor, MD  Cardiologist:  Sherren Mocha, MD    Chief Complaint  Patient presents with  . Follow-up    CHF     History of Present Illness: Tracy [name] is a 72 y.o. female who presents for follow-up of CAD. She has a hx of MI treated with bare metal stenting of the left circumflex in 2007. She recently has been followed closely by Ermalinda Barrios, who has treated her for diastolic heart failure. A nuclear stress test was ordered to evaluate continued chest pain. This study demonstrated inferolateral ischemia and the patient presents today to discuss further diagnostic and treatment options.   The patient is here with her husband today.  She complains of increased fatigue, dyspnea with exertion, and left-sided chest pain.  Her chest pain is sharp and nature.  It is brought on by stress and sometimes by physical exertion.  It is relieved with rest.  She has no substernal pressure.  She is short of breath with low level physical activity.  She complains of orthopnea, denies PND.  She has had leg swelling but this is been somewhat improved since she has discontinued amlodipine.  She denies heart palpitations.  Past Medical History:  Diagnosis Date  . Allergic rhinitis   . CHF (congestive heart failure) ([place])   . Gout   . Headache(784.0)   . Hyperlipidemia   . Hypertension   . Low back pain   . Menopausal syndrome   . Myocardial infarct ([place]) 2007   hx of  . Overactive bladder     Past Surgical History:  Procedure Laterality Date  . ANGIOPLASTY     stent 2007  . COLONOSCOPY    . TONSILLECTOMY      Current Outpatient Medications  Medication Sig Dispense Refill  . allopurinol (ZYLOPRIM) 100 MG tablet TAKE 1 TABLET BY MOUTH EVERY DAY 90 tablet 0  . ALPRAZolam (XANAX) 0.25 MG tablet TAKE 1 TABLET BY MOUTH TWICE A DAY AS NEEDED FOR ANXIETY 60 tablet 5    . Ascorbic Acid (VITAMIN C) 1000 MG tablet Take 1,000 mg by mouth daily.    Marland Kitchen aspirin 81 MG tablet Take 81 mg by mouth daily.      Marland Kitchen atorvastatin (LIPITOR) 40 MG tablet Take 1 tablet (40 mg total) daily by mouth. Please keep upcoming appt for future refills. Thanks 90 tablet 0  . calcium carbonate (CALCIUM 600) 600 MG TABS tablet Take 600 mg by mouth daily with breakfast.    . cetirizine (ZYRTEC) 10 MG tablet Take 10 mg by mouth daily.    . Cholecalciferol (VITAMIN D3 PO) Take 1,000 Units by mouth daily.    . Coenzyme Q10 (CO Q 10 PO) Take 300 mg by mouth daily.    . Cyanocobalamin 5000 MCG CAPS Take 1 capsule by mouth daily.    . furosemide (LASIX) 20 MG tablet Take 40 mg by mouth every morning.    Marland Kitchen HYDROcodone-acetaminophen (NORCO/VICODIN) 5-325 MG tablet TAKE 1 TABLET EVERY 6 HOURS AS NEEDED FOR PAIN 60 tablet 0  . isosorbide mononitrate (IMDUR) 30 MG 24 hr tablet Take 1 tablet (30 mg total) daily by mouth. 90 tablet 3  . losartan (COZAAR) 100 MG tablet TAKE 1 TABLET (100 MG TOTAL) BY MOUTH DAILY. 90 tablet 3  . metoprolol tartrate (LOPRESSOR) 100 MG tablet TAKE 1 TABLET (100 MG TOTAL) BY  MOUTH 2 (TWO) TIMES DAILY. 180 tablet 1  . Misc Natural Products (TART CHERRY ADVANCED) CAPS Take 1 capsule by mouth daily.    . Na Sulfate-K Sulfate-Mg Sulf 17.5-3.13-1.6 GM/177ML SOLN Suprep (no substitutions)-TAKE AS DIRECTED. 354 mL 0  . nitroGLYCERIN (NITROSTAT) 0.4 MG SL tablet Place 0.4 mg under the tongue every 5 (five) minutes as needed for chest pain.    . Omega-3 Fatty Acids (FISH OIL OMEGA-3 PO) Take 1 capsule by mouth daily.    . pantoprazole (PROTONIX) 40 MG tablet TAKE 1 TABLET (40 MG TOTAL) BY MOUTH DAILY. 90 tablet 1   No current facility-administered medications for this visit.     Allergies:   Erythromycin and Sulfamethoxazole   Social History:  The patient  reports that she quit smoking about 5 years ago. she has never used smokeless tobacco. She reports that she does not drink  alcohol or use drugs.   Family History:  The patient's family history includes Colon cancer (age of onset: 81) in her mother; Other in her unknown relative. She was adopted.    ROS:  Please see the history of present illness.  Otherwise, review of systems is positive for leg swelling, back pain, muscle pain, dizziness,, leg pain, constipation.  All other systems are reviewed and negative.   PHYSICAL EXAM: VS:  BP 120/66   Pulse 80   Ht 5\' 5"  (1.651 m)   Wt 205 lb (93 kg)   BMI 34.11 kg/m  , BMI Body mass index is 34.11 kg/m. GEN: Well nourished, well developed, in no acute distress  HEENT: normal  Neck: no JVD, no masses. No carotid bruits Cardiac: RRR without murmur or gallop     Respiratory:  clear to auscultation bilaterally, normal work of breathing GI: soft, nontender, nondistended, + BS MS: no deformity or atrophy  Ext: no pretibial edema, pedal pulses 2+= bilaterally Skin: warm and dry, no rash Neuro:  Strength and sensation are intact Psych: euthymic mood, full affect  EKG:  EKG is not ordered today.  Recent Labs: 07/31/2017: ALT 37 09/04/2017: NT-Pro BNP 480 10/20/2017: BUN 35; Creatinine, Ser 1.57; Hemoglobin WILL FOLLOW; Platelets WILL FOLLOW; Potassium 5.1; Sodium 143   Lipid Panel     Component Value Date/Time   CHOL 154 09/13/2016 0738   TRIG 175 (H) 09/13/2016 0738   HDL 31 (L) 09/13/2016 0738   CHOLHDL 5.0 (H) 09/13/2016 0738   VLDL 35 (H) 09/13/2016 0738   LDLCALC 88 09/13/2016 0738   LDLDIRECT 111.0 01/05/2015 1019      Wt Readings from Last 3 Encounters:  10/20/17 205 lb (93 kg)  10/03/17 206 lb (93.4 kg)  09/12/17 201 lb (91.2 kg)     Cardiac Studies Reviewed: Myoview Scan 10-13-2017: Study Highlights     Nuclear stress EF: 51%.  There was no ST segment deviation noted during stress.  Defect 1: There is a medium defect of moderate severity present in the basal inferior, basal inferolateral, mid inferior and mid inferolateral  location.  This is an intermediate risk study.  Findings consistent with ischemia.  The left ventricular ejection fraction is mildly decreased (45-54%).   There is a medium size, moderate severity reversible defect in the basal and mid inferior and inferolateral walls consistent with ischemia (SDS =4).    Echo 09-29-2017: Left ventricle:  The cavity size was normal. Wall thickness was increased in a pattern of mild LVH. Systolic function was normal. The estimated ejection fraction was in the range of 50%  to 55%. Wall motion was normal; there were no regional wall motion abnormalities. Doppler parameters are consistent with abnormal left ventricular relaxation (grade 1 diastolic dysfunction).  ------------------------------------------------------------------- Aortic valve:   Structurally normal valve.   Cusp separation was normal.  Doppler:  Transvalvular velocity was within the normal range. There was no stenosis. There was no regurgitation.  ------------------------------------------------------------------- Aorta:  Aortic root: The aortic root was normal in size. Ascending aorta: The ascending aorta was normal in size.  ------------------------------------------------------------------- Mitral valve:   Structurally normal valve.   Leaflet separation was normal.  Doppler:  Transvalvular velocity was within the normal range. There was no evidence for stenosis. There was no regurgitation.  ------------------------------------------------------------------- Left atrium:  The atrium was normal in size.  ------------------------------------------------------------------- Right ventricle:  The cavity size was normal. Systolic function was normal.  ------------------------------------------------------------------- Pulmonic valve:    The valve appears to be grossly normal. Doppler:  There was no significant  regurgitation.  ------------------------------------------------------------------- Tricuspid valve:   Structurally normal valve.   Leaflet separation was normal.  Doppler:  Transvalvular velocity was within the normal range. There was trivial regurgitation.  ------------------------------------------------------------------- Right atrium:  The atrium was normal in size.  ------------------------------------------------------------------- Pericardium:  There was no pericardial effusion.  ASSESSMENT AND PLAN: 1.  Coronary artery disease, with angina.  The patient has an intermediate risk stress nuclear scan with inferior and inferolateral ischemia.  She has remote history of bare-metal stenting of the left circumflex.  Considering her progressive symptoms, abnormal nuclear scan demonstrating ischemia in the same territory as her previous infarction, I have recommended cardiac catheterization and possible PCI. I have reviewed the risks, indications, and alternatives to cardiac catheterization, possible angioplasty, and stenting with the patient. Risks include but are not limited to bleeding, infection, vascular injury, stroke, myocardial infection, arrhythmia, kidney injury, radiation-related injury in the case of prolonged fluoroscopy use, emergency cardiac surgery, and death. The patient understands the risks of serious complication is 1-2 in 4270 with diagnostic cardiac cath and 1-2% or less with angioplasty/stenting.  Medications are reviewed and will be continued.  Her antianginal program includes isosorbide and metoprolol.  She is on antiplatelet therapy with aspirin and a high intensity statin drug with atorvastatin 40 mg.  2.  Hypertension: Blood pressure well controlled.  3.  Hyperlipidemia: Treated with a high intensity statin drug using atorvastatin 40 mg daily.  4.  Chronic diastolic heart failure: The patient will continue on her current dose of furosemide.  Her medical program is  reviewed and that includes losartan, metoprolol, and isosorbide.  She appears euvolemic on exam. Current medicines are reviewed with the patient today.  The patient does not have concerns regarding medicines.  Labs/ tests ordered today include:   Orders Placed This Encounter  Procedures  . Basic metabolic panel  . CBC with Differential/Platelet  . INR/PT    Disposition:   FU pending cath results.   Signed, Sherren Mocha, MD  10/20/2017 5:55 PM    [address], [place], [place]  [address] Phone: [phone]; Fax: [phone]

## 2017-10-19 NOTE — Progress Notes (Signed)
Cardiology Office Note Date:  10/20/2017   ID:  Tracy [name], DOB [birth year], MRN [id number]  PCP:  Marletta Lor, MD  Cardiologist:  Sherren Mocha, MD    Chief Complaint  Patient presents with  . Follow-up    CHF     History of Present Illness: Tracy [name] is a 72 y.o. female who presents for follow-up of CAD. She has a hx of MI treated with bare metal stenting of the left circumflex in 2007. She recently has been followed closely by Ermalinda Barrios, who has treated her for diastolic heart failure. A nuclear stress test was ordered to evaluate continued chest pain. This study demonstrated inferolateral ischemia and the patient presents today to discuss further diagnostic and treatment options.   The patient is here with her husband today.  She complains of increased fatigue, dyspnea with exertion, and left-sided chest pain.  Her chest pain is sharp and nature.  It is brought on by stress and sometimes by physical exertion.  It is relieved with rest.  She has no substernal pressure.  She is short of breath with low level physical activity.  She complains of orthopnea, denies PND.  She has had leg swelling but this is been somewhat improved since she has discontinued amlodipine.  She denies heart palpitations.  Past Medical History:  Diagnosis Date  . Allergic rhinitis   . CHF (congestive heart failure) ([place])   . Gout   . Headache(784.0)   . Hyperlipidemia   . Hypertension   . Low back pain   . Menopausal syndrome   . Myocardial infarct ([place]) 2007   hx of  . Overactive bladder     Past Surgical History:  Procedure Laterality Date  . ANGIOPLASTY     stent 2007  . COLONOSCOPY    . TONSILLECTOMY      Current Outpatient Medications  Medication Sig Dispense Refill  . allopurinol (ZYLOPRIM) 100 MG tablet TAKE 1 TABLET BY MOUTH EVERY DAY 90 tablet 0  . ALPRAZolam (XANAX) 0.25 MG tablet TAKE 1 TABLET BY MOUTH TWICE A DAY AS NEEDED FOR ANXIETY 60 tablet 5    . Ascorbic Acid (VITAMIN C) 1000 MG tablet Take 1,000 mg by mouth daily.    Marland Kitchen aspirin 81 MG tablet Take 81 mg by mouth daily.      Marland Kitchen atorvastatin (LIPITOR) 40 MG tablet Take 1 tablet (40 mg total) daily by mouth. Please keep upcoming appt for future refills. Thanks 90 tablet 0  . calcium carbonate (CALCIUM 600) 600 MG TABS tablet Take 600 mg by mouth daily with breakfast.    . cetirizine (ZYRTEC) 10 MG tablet Take 10 mg by mouth daily.    . Cholecalciferol (VITAMIN D3 PO) Take 1,000 Units by mouth daily.    . Coenzyme Q10 (CO Q 10 PO) Take 300 mg by mouth daily.    . Cyanocobalamin 5000 MCG CAPS Take 1 capsule by mouth daily.    . furosemide (LASIX) 20 MG tablet Take 40 mg by mouth every morning.    Marland Kitchen HYDROcodone-acetaminophen (NORCO/VICODIN) 5-325 MG tablet TAKE 1 TABLET EVERY 6 HOURS AS NEEDED FOR PAIN 60 tablet 0  . isosorbide mononitrate (IMDUR) 30 MG 24 hr tablet Take 1 tablet (30 mg total) daily by mouth. 90 tablet 3  . losartan (COZAAR) 100 MG tablet TAKE 1 TABLET (100 MG TOTAL) BY MOUTH DAILY. 90 tablet 3  . metoprolol tartrate (LOPRESSOR) 100 MG tablet TAKE 1 TABLET (100 MG TOTAL) BY  MOUTH 2 (TWO) TIMES DAILY. 180 tablet 1  . Misc Natural Products (TART CHERRY ADVANCED) CAPS Take 1 capsule by mouth daily.    . Na Sulfate-K Sulfate-Mg Sulf 17.5-3.13-1.6 GM/177ML SOLN Suprep (no substitutions)-TAKE AS DIRECTED. 354 mL 0  . nitroGLYCERIN (NITROSTAT) 0.4 MG SL tablet Place 0.4 mg under the tongue every 5 (five) minutes as needed for chest pain.    . Omega-3 Fatty Acids (FISH OIL OMEGA-3 PO) Take 1 capsule by mouth daily.    . pantoprazole (PROTONIX) 40 MG tablet TAKE 1 TABLET (40 MG TOTAL) BY MOUTH DAILY. 90 tablet 1   No current facility-administered medications for this visit.     Allergies:   Erythromycin and Sulfamethoxazole   Social History:  The patient  reports that she quit smoking about 5 years ago. she has never used smokeless tobacco. She reports that she does not drink  alcohol or use drugs.   Family History:  The patient's family history includes Colon cancer (age of onset: 29) in her mother; Other in her unknown relative. She was adopted.    ROS:  Please see the history of present illness.  Otherwise, review of systems is positive for leg swelling, back pain, muscle pain, dizziness,, leg pain, constipation.  All other systems are reviewed and negative.   PHYSICAL EXAM: VS:  BP 120/66   Pulse 80   Ht 5\' 5"  (1.651 m)   Wt 205 lb (93 kg)   BMI 34.11 kg/m  , BMI Body mass index is 34.11 kg/m. GEN: Well nourished, well developed, in no acute distress  HEENT: normal  Neck: no JVD, no masses. No carotid bruits Cardiac: RRR without murmur or gallop     Respiratory:  clear to auscultation bilaterally, normal work of breathing GI: soft, nontender, nondistended, + BS MS: no deformity or atrophy  Ext: no pretibial edema, pedal pulses 2+= bilaterally Skin: warm and dry, no rash Neuro:  Strength and sensation are intact Psych: euthymic mood, full affect  EKG:  EKG is not ordered today.  Recent Labs: 07/31/2017: ALT 37 09/04/2017: NT-Pro BNP 480 10/20/2017: BUN 35; Creatinine, Ser 1.57; Hemoglobin WILL FOLLOW; Platelets WILL FOLLOW; Potassium 5.1; Sodium 143   Lipid Panel     Component Value Date/Time   CHOL 154 09/13/2016 0738   TRIG 175 (H) 09/13/2016 0738   HDL 31 (L) 09/13/2016 0738   CHOLHDL 5.0 (H) 09/13/2016 0738   VLDL 35 (H) 09/13/2016 0738   LDLCALC 88 09/13/2016 0738   LDLDIRECT 111.0 01/05/2015 1019      Wt Readings from Last 3 Encounters:  10/20/17 205 lb (93 kg)  10/03/17 206 lb (93.4 kg)  09/12/17 201 lb (91.2 kg)     Cardiac Studies Reviewed: Myoview Scan 10-13-2017: Study Highlights     Nuclear stress EF: 51%.  There was no ST segment deviation noted during stress.  Defect 1: There is a medium defect of moderate severity present in the basal inferior, basal inferolateral, mid inferior and mid inferolateral  location.  This is an intermediate risk study.  Findings consistent with ischemia.  The left ventricular ejection fraction is mildly decreased (45-54%).   There is a medium size, moderate severity reversible defect in the basal and mid inferior and inferolateral walls consistent with ischemia (SDS =4).    Echo 09-29-2017: Left ventricle:  The cavity size was normal. Wall thickness was increased in a pattern of mild LVH. Systolic function was normal. The estimated ejection fraction was in the range of 50%  to 55%. Wall motion was normal; there were no regional wall motion abnormalities. Doppler parameters are consistent with abnormal left ventricular relaxation (grade 1 diastolic dysfunction).  ------------------------------------------------------------------- Aortic valve:   Structurally normal valve.   Cusp separation was normal.  Doppler:  Transvalvular velocity was within the normal range. There was no stenosis. There was no regurgitation.  ------------------------------------------------------------------- Aorta:  Aortic root: The aortic root was normal in size. Ascending aorta: The ascending aorta was normal in size.  ------------------------------------------------------------------- Mitral valve:   Structurally normal valve.   Leaflet separation was normal.  Doppler:  Transvalvular velocity was within the normal range. There was no evidence for stenosis. There was no regurgitation.  ------------------------------------------------------------------- Left atrium:  The atrium was normal in size.  ------------------------------------------------------------------- Right ventricle:  The cavity size was normal. Systolic function was normal.  ------------------------------------------------------------------- Pulmonic valve:    The valve appears to be grossly normal. Doppler:  There was no significant  regurgitation.  ------------------------------------------------------------------- Tricuspid valve:   Structurally normal valve.   Leaflet separation was normal.  Doppler:  Transvalvular velocity was within the normal range. There was trivial regurgitation.  ------------------------------------------------------------------- Right atrium:  The atrium was normal in size.  ------------------------------------------------------------------- Pericardium:  There was no pericardial effusion.  ASSESSMENT AND PLAN: 1.  Coronary artery disease, with angina.  The patient has an intermediate risk stress nuclear scan with inferior and inferolateral ischemia.  She has remote history of bare-metal stenting of the left circumflex.  Considering her progressive symptoms, abnormal nuclear scan demonstrating ischemia in the same territory as her previous infarction, I have recommended cardiac catheterization and possible PCI. I have reviewed the risks, indications, and alternatives to cardiac catheterization, possible angioplasty, and stenting with the patient. Risks include but are not limited to bleeding, infection, vascular injury, stroke, myocardial infection, arrhythmia, kidney injury, radiation-related injury in the case of prolonged fluoroscopy use, emergency cardiac surgery, and death. The patient understands the risks of serious complication is 1-2 in 6644 with diagnostic cardiac cath and 1-2% or less with angioplasty/stenting.  Medications are reviewed and will be continued.  Her antianginal program includes isosorbide and metoprolol.  She is on antiplatelet therapy with aspirin and a high intensity statin drug with atorvastatin 40 mg.  2.  Hypertension: Blood pressure well controlled.  3.  Hyperlipidemia: Treated with a high intensity statin drug using atorvastatin 40 mg daily.  4.  Chronic diastolic heart failure: The patient will continue on her current dose of furosemide.  Her medical program is  reviewed and that includes losartan, metoprolol, and isosorbide.  She appears euvolemic on exam. Current medicines are reviewed with the patient today.  The patient does not have concerns regarding medicines.  Labs/ tests ordered today include:   Orders Placed This Encounter  Procedures  . Basic metabolic panel  . CBC with Differential/Platelet  . INR/PT    Disposition:   FU pending cath results.   Signed, Sherren Mocha, MD  10/20/2017 5:55 PM    [address], [place], [place]  [address] Phone: [phone]; Fax: [phone]

## 2017-10-20 ENCOUNTER — Encounter: Payer: Self-pay | Admitting: Cardiovascular Disease

## 2017-10-20 ENCOUNTER — Ambulatory Visit (INDEPENDENT_AMBULATORY_CARE_PROVIDER_SITE_OTHER): Payer: Medicare Other | Admitting: Cardiovascular Disease

## 2017-10-20 VITALS — BP 120/66 | HR 80 | Ht 65.0 in | Wt 205.0 lb

## 2017-10-20 DIAGNOSIS — I2510 Atherosclerotic heart disease of native coronary artery without angina pectoris: Secondary | ICD-10-CM | POA: Diagnosis not present

## 2017-10-20 DIAGNOSIS — R9439 Abnormal result of other cardiovascular function study: Secondary | ICD-10-CM | POA: Diagnosis not present

## 2017-10-20 NOTE — Patient Instructions (Addendum)
Medication Instructions:  Your provider recommends that you continue on your current medications as directed. Please refer to the Current Medication list given to you today.    Labwork: TODAY: BMET, CBC, PT/INR  Testing/Procedures: Your physician has requested that you have a cardiac catheterization. Cardiac catheterization is used to diagnose and/or treat various heart conditions. Doctors may recommend this procedure for a number of different reasons. The most common reason is to evaluate chest pain. Chest pain can be a symptom of coronary artery disease (CAD), and cardiac catheterization can show whether plaque is narrowing or blocking your heart's arteries. This procedure is also used to evaluate the valves, as well as measure the blood flow and oxygen levels in different parts of your heart. For further information please visit [email or web address]. Please follow instruction sheet, as given.  Follow-Up: Your provider recommends that you schedule a follow-up appointment AS NEEDED pending study results.  Any Other Special Instructions Will Be Listed Below (If Applicable).   [facility] OFFICE [address] [place] [address] Dept: [phone] Loc: [phone]  MARIO VOONG  10/20/2017  You are scheduled for a Cardiac Catheterization on Tuesday, January 15 with Dr. Sherren Mocha.  1. Please arrive at the [facility] (Main Entrance A) at [facility]: [address] [place], [place] [address] at 5:30 AM (two hours before your procedure to ensure your preparation). Free valet parking service is available.   Special note: Every effort is made to have your procedure done on time. Please understand that emergencies sometimes delay scheduled procedures.  2. Diet: Do not eat or drink anything after midnight prior to your procedure except sips of water to take medications.  3. Labs:  Today!  4. Medication instructions in preparation for your procedure:  1) MAKE SURE TO TAKE YOUR ASPIRIN  2) HOLD Lasix the morning of your procedure.  3) HOLD Losartan the morning of and morning before cath  5. Plan for one night stay--bring personal belongings. 6. Bring a current list of your medications and current insurance cards. 7. You MUST have a responsible person to drive you home. 8. Someone MUST be with you the first 24 hours after you arrive home or your discharge will be delayed. 9. Please wear clothes that are easy to get on and off and wear slip-on shoes.  Thank you for allowing [place] to care for you!   -- [facility]

## 2017-10-21 LAB — CBC WITH DIFFERENTIAL/PLATELET
Basophils Absolute: 0.1 10*3/uL (ref 0.0–0.2)
Basos: 1 %
EOS (ABSOLUTE): 0.4 10*3/uL (ref 0.0–0.4)
Eos: 4 %
Hematocrit: 37.5 % (ref 34.0–46.6)
Hemoglobin: 12.6 g/dL (ref 11.1–15.9)
Immature Grans (Abs): 0 10*3/uL (ref 0.0–0.1)
Immature Granulocytes: 0 %
Lymphocytes Absolute: 4.1 10*3/uL — ABNORMAL HIGH (ref 0.7–3.1)
Lymphs: 39 %
MCH: 28.8 pg (ref 26.6–33.0)
MCHC: 33.6 g/dL (ref 31.5–35.7)
MCV: 86 fL (ref 79–97)
Monocytes Absolute: 1.2 10*3/uL — ABNORMAL HIGH (ref 0.1–0.9)
Monocytes: 12 %
Neutrophils Absolute: 4.7 10*3/uL (ref 1.4–7.0)
Neutrophils: 44 %
Platelets: 297 10*3/uL (ref 150–379)
RBC: 4.37 x10E6/uL (ref 3.77–5.28)
RDW: 15.5 % — ABNORMAL HIGH (ref 12.3–15.4)
WBC: 10.5 10*3/uL (ref 3.4–10.8)

## 2017-10-21 LAB — PROTIME-INR
INR: 1 (ref 0.8–1.2)
Prothrombin Time: 10.4 s (ref 9.1–12.0)

## 2017-10-21 LAB — BASIC METABOLIC PANEL
BUN/Creatinine Ratio: 22 (ref 12–28)
BUN: 35 mg/dL — ABNORMAL HIGH (ref 8–27)
CO2: 24 mmol/L (ref 20–29)
Calcium: 9.6 mg/dL (ref 8.7–10.3)
Chloride: 103 mmol/L (ref 96–106)
Creatinine, Ser: 1.57 mg/dL — ABNORMAL HIGH (ref 0.57–1.00)
GFR calc Af Amer: 39 mL/min/{1.73_m2} — ABNORMAL LOW (ref >59)
GFR calc non Af Amer: 33 mL/min/{1.73_m2} — ABNORMAL LOW (ref >59)
Glucose: 115 mg/dL — ABNORMAL HIGH (ref 65–99)
Potassium: 5.1 mmol/L (ref 3.5–5.2)
Sodium: 143 mmol/L (ref 134–144)

## 2017-10-25 DIAGNOSIS — H26491 Other secondary cataract, right eye: Secondary | ICD-10-CM | POA: Diagnosis not present

## 2017-10-27 ENCOUNTER — Telehealth: Payer: Self-pay | Admitting: *Deleted

## 2017-10-27 NOTE — Telephone Encounter (Signed)
Spoke with patient. Notified patient that [facility] reviewed her information and he states she may need to post-pone the colonoscopy if they put a heart stent in. Our policy states 1 month from stent placement but if not and she doing ok she may still have colon on 11-21-17 as planned. Pt "feels" like she will get a stent but she will call us back after cath to let us know if she needs to reschedule.

## 2017-10-30 ENCOUNTER — Telehealth: Payer: Self-pay | Admitting: Cardiovascular Disease

## 2017-10-30 ENCOUNTER — Telehealth: Payer: Self-pay

## 2017-10-30 DIAGNOSIS — R9439 Abnormal result of other cardiovascular function study: Secondary | ICD-10-CM

## 2017-10-30 DIAGNOSIS — I2510 Atherosclerotic heart disease of native coronary artery without angina pectoris: Secondary | ICD-10-CM

## 2017-10-30 DIAGNOSIS — R079 Chest pain, unspecified: Secondary | ICD-10-CM

## 2017-10-30 NOTE — Telephone Encounter (Signed)
New message   Patient says that she has a procedure that she's suppose to be admitted for tomorrow 10/31/2017 but her husband is in the hospital And he is her transportation. Patient wants to know what she should do Please call

## 2017-10-30 NOTE — Telephone Encounter (Signed)
error 

## 2017-10-30 NOTE — Telephone Encounter (Signed)
Rescheduled heart cath for Monday, January 21 with Dr. Saunders Revel. She will come in for repeat labs this Friday (her last labs were drawn 1/4). She was grateful for assistance.

## 2017-11-02 ENCOUNTER — Telehealth: Payer: Self-pay

## 2017-11-02 NOTE — Telephone Encounter (Signed)
Patient contacted pre-catheterization at [facility] scheduled for:  11/06/2017 @ 0730 Verified arrival time and place:  Nt @ 0530 Confirmed AM meds to be taken pre-cath with sip of water: Hold losartan Sunday/Monday Hold lasix Monday Take ASA Confirmed patient has responsible person to drive home post procedure and observe patient for 24 hours:  yes Addl concerns:  none

## 2017-11-03 ENCOUNTER — Other Ambulatory Visit: Payer: Medicare Other | Admitting: *Deleted

## 2017-11-03 DIAGNOSIS — I2510 Atherosclerotic heart disease of native coronary artery without angina pectoris: Secondary | ICD-10-CM

## 2017-11-03 DIAGNOSIS — R9439 Abnormal result of other cardiovascular function study: Secondary | ICD-10-CM | POA: Diagnosis not present

## 2017-11-03 DIAGNOSIS — R079 Chest pain, unspecified: Secondary | ICD-10-CM | POA: Diagnosis not present

## 2017-11-03 LAB — CBC WITH DIFFERENTIAL/PLATELET
Basophils Absolute: 0.1 10*3/uL (ref 0.0–0.2)
Basos: 1 %
EOS (ABSOLUTE): 0.4 10*3/uL (ref 0.0–0.4)
Eos: 5 %
Hematocrit: 36 % (ref 34.0–46.6)
Hemoglobin: 11.8 g/dL (ref 11.1–15.9)
Immature Grans (Abs): 0 10*3/uL (ref 0.0–0.1)
Immature Granulocytes: 0 %
Lymphocytes Absolute: 4 10*3/uL — ABNORMAL HIGH (ref 0.7–3.1)
Lymphs: 44 %
MCH: 28.9 pg (ref 26.6–33.0)
MCHC: 32.8 g/dL (ref 31.5–35.7)
MCV: 88 fL (ref 79–97)
Monocytes Absolute: 1 10*3/uL — ABNORMAL HIGH (ref 0.1–0.9)
Monocytes: 11 %
Neutrophils Absolute: 3.5 10*3/uL (ref 1.4–7.0)
Neutrophils: 39 %
Platelets: 213 10*3/uL (ref 150–379)
RBC: 4.08 x10E6/uL (ref 3.77–5.28)
RDW: 15.1 % (ref 12.3–15.4)
WBC: 9.1 10*3/uL (ref 3.4–10.8)

## 2017-11-03 LAB — PROTIME-INR
INR: 1 (ref 0.8–1.2)
Prothrombin Time: 10.4 s (ref 9.1–12.0)

## 2017-11-03 LAB — BASIC METABOLIC PANEL
BUN/Creatinine Ratio: 23 (ref 12–28)
BUN: 29 mg/dL — ABNORMAL HIGH (ref 8–27)
CO2: 21 mmol/L (ref 20–29)
Calcium: 8.8 mg/dL (ref 8.7–10.3)
Chloride: 105 mmol/L (ref 96–106)
Creatinine, Ser: 1.26 mg/dL — ABNORMAL HIGH (ref 0.57–1.00)
GFR calc Af Amer: 50 mL/min/{1.73_m2} — ABNORMAL LOW (ref >59)
GFR calc non Af Amer: 44 mL/min/{1.73_m2} — ABNORMAL LOW (ref >59)
Glucose: 97 mg/dL (ref 65–99)
Potassium: 4.6 mmol/L (ref 3.5–5.2)
Sodium: 141 mmol/L (ref 134–144)

## 2017-11-06 ENCOUNTER — Encounter (HOSPITAL_COMMUNITY)
Admission: RE | Disposition: A | Discharge status: Home / Self Care | Payer: Self-pay | Source: Ambulatory Visit | Attending: Internal Medicine

## 2017-11-06 ENCOUNTER — Encounter (HOSPITAL_COMMUNITY): Payer: Self-pay | Admitting: *Deleted

## 2017-11-06 ENCOUNTER — Ambulatory Visit (HOSPITAL_COMMUNITY)
Admission: RE | Admit: 2017-11-06 | Discharge: 2017-11-06 | Disposition: A | Discharge status: Home / Self Care | Payer: Medicare Other | Source: Ambulatory Visit | Attending: Internal Medicine | Admitting: Internal Medicine

## 2017-11-06 DIAGNOSIS — Z7982 Long term (current) use of aspirin: Secondary | ICD-10-CM | POA: Insufficient documentation

## 2017-11-06 DIAGNOSIS — Y838 Other surgical procedures as the cause of abnormal reaction of the patient, or of later complication, without mention of misadventure at the time of the procedure: Secondary | ICD-10-CM | POA: Diagnosis not present

## 2017-11-06 DIAGNOSIS — I5032 Chronic diastolic (congestive) heart failure: Secondary | ICD-10-CM | POA: Diagnosis not present

## 2017-11-06 DIAGNOSIS — R0600 Dyspnea, unspecified: Secondary | ICD-10-CM | POA: Diagnosis present

## 2017-11-06 DIAGNOSIS — E785 Hyperlipidemia, unspecified: Secondary | ICD-10-CM | POA: Insufficient documentation

## 2017-11-06 DIAGNOSIS — I2584 Coronary atherosclerosis due to calcified coronary lesion: Secondary | ICD-10-CM | POA: Insufficient documentation

## 2017-11-06 DIAGNOSIS — T82855A Stenosis of coronary artery stent, initial encounter: Secondary | ICD-10-CM | POA: Insufficient documentation

## 2017-11-06 DIAGNOSIS — I25119 Atherosclerotic heart disease of native coronary artery with unspecified angina pectoris: Secondary | ICD-10-CM | POA: Diagnosis not present

## 2017-11-06 DIAGNOSIS — R9439 Abnormal result of other cardiovascular function study: Secondary | ICD-10-CM | POA: Diagnosis present

## 2017-11-06 DIAGNOSIS — I2510 Atherosclerotic heart disease of native coronary artery without angina pectoris: Secondary | ICD-10-CM

## 2017-11-06 DIAGNOSIS — R0602 Shortness of breath: Secondary | ICD-10-CM | POA: Diagnosis present

## 2017-11-06 DIAGNOSIS — E669 Obesity, unspecified: Secondary | ICD-10-CM | POA: Diagnosis not present

## 2017-11-06 DIAGNOSIS — R0609 Other forms of dyspnea: Secondary | ICD-10-CM | POA: Diagnosis present

## 2017-11-06 DIAGNOSIS — I252 Old myocardial infarction: Secondary | ICD-10-CM | POA: Diagnosis not present

## 2017-11-06 DIAGNOSIS — I110 Hypertensive heart disease with heart failure: Secondary | ICD-10-CM | POA: Diagnosis not present

## 2017-11-06 DIAGNOSIS — Z87891 Personal history of nicotine dependence: Secondary | ICD-10-CM | POA: Diagnosis not present

## 2017-11-06 DIAGNOSIS — Z955 Presence of coronary angioplasty implant and graft: Secondary | ICD-10-CM

## 2017-11-06 DIAGNOSIS — Z79899 Other long term (current) drug therapy: Secondary | ICD-10-CM | POA: Diagnosis not present

## 2017-11-06 HISTORY — PX: LEFT HEART CATH AND CORONARY ANGIOGRAPHY: CATH118249

## 2017-11-06 HISTORY — PX: CORONARY STENT INTERVENTION: CATH118234

## 2017-11-06 HISTORY — DX: Atherosclerotic heart disease of native coronary artery without angina pectoris: I25.10

## 2017-11-06 LAB — POCT ACTIVATED CLOTTING TIME
Activated Clotting Time: 224 seconds
Activated Clotting Time: 252 seconds
Activated Clotting Time: 241 seconds

## 2017-11-06 SURGERY — LEFT HEART CATH AND CORONARY ANGIOGRAPHY
Anesthesia: LOCAL

## 2017-11-06 MED ORDER — SODIUM CHLORIDE 0.9% FLUSH: 3.0000 mL | Freq: Two times a day (BID) | INTRAVENOUS | Status: DC

## 2017-11-06 MED ORDER — SODIUM CHLORIDE 0.9 % WEIGHT BASED INFUSION
3.0000 mL/kg/h | INTRAVENOUS | Status: AC
  Administered 2017-11-06: 3 mL/kg/h via INTRAVENOUS

## 2017-11-06 MED ORDER — HEPARIN SODIUM (PORCINE) 1000 UNIT/ML IJ SOLN
INTRAMUSCULAR | Status: AC
  Filled 2017-11-06: qty 1

## 2017-11-06 MED ORDER — CLOPIDOGREL BISULFATE 75 MG PO TABS: 75.0000 mg | ORAL_TABLET | Freq: Every day | ORAL | 0 refills | Status: DC

## 2017-11-06 MED ORDER — HEPARIN (PORCINE) IN NACL 2-0.9 UNIT/ML-% IJ SOLN
INTRAMUSCULAR | Status: AC
  Filled 2017-11-06: qty 1000

## 2017-11-06 MED ORDER — CLOPIDOGREL BISULFATE 75 MG PO TABS: 75.0000 mg | ORAL_TABLET | Freq: Every day | ORAL | Status: DC

## 2017-11-06 MED ORDER — CLOPIDOGREL BISULFATE 300 MG PO TABS
ORAL_TABLET | ORAL | Status: DC | PRN
  Administered 2017-11-06: 600 mg via ORAL

## 2017-11-06 MED ORDER — IOPAMIDOL (ISOVUE-370) INJECTION 76%
INTRAVENOUS | Status: AC
  Filled 2017-11-06: qty 50

## 2017-11-06 MED ORDER — SODIUM CHLORIDE 0.9 % IV SOLN: 250.0000 mL | INTRAVENOUS | Status: DC | PRN

## 2017-11-06 MED ORDER — SODIUM CHLORIDE 0.9 % WEIGHT BASED INFUSION: 1.0000 mL/kg/h | INTRAVENOUS | Status: DC

## 2017-11-06 MED ORDER — IOPAMIDOL (ISOVUE-370) INJECTION 76%
INTRAVENOUS | Status: AC
  Filled 2017-11-06: qty 100

## 2017-11-06 MED ORDER — MIDAZOLAM HCL 2 MG/2ML IJ SOLN
INTRAMUSCULAR | Status: AC
  Filled 2017-11-06: qty 2

## 2017-11-06 MED ORDER — CLOPIDOGREL BISULFATE 300 MG PO TABS
ORAL_TABLET | ORAL | Status: AC
  Filled 2017-11-06: qty 2

## 2017-11-06 MED ORDER — SODIUM CHLORIDE 0.9 % IV SOLN
250.0000 mL | INTRAVENOUS | Status: DC | PRN
Start: 2017-11-06 — End: 2017-11-06

## 2017-11-06 MED ORDER — SODIUM CHLORIDE 0.9% FLUSH: 3.0000 mL | INTRAVENOUS | Status: DC | PRN

## 2017-11-06 MED ORDER — HEPARIN SODIUM (PORCINE) 1000 UNIT/ML IJ SOLN
INTRAMUSCULAR | Status: DC | PRN
  Administered 2017-11-06: 4000 [IU] via INTRAVENOUS
  Administered 2017-11-06: 2000 [IU] via INTRAVENOUS
  Administered 2017-11-06: 3000 [IU] via INTRAVENOUS
  Administered 2017-11-06: 4500 [IU] via INTRAVENOUS

## 2017-11-06 MED ORDER — LIDOCAINE HCL (PF) 1 % IJ SOLN
INTRAMUSCULAR | Status: DC | PRN
  Administered 2017-11-06: 2 mL

## 2017-11-06 MED ORDER — ASPIRIN 81 MG PO CHEW: 81.0000 mg | CHEWABLE_TABLET | ORAL | Status: DC

## 2017-11-06 MED ORDER — FENTANYL CITRATE (PF) 100 MCG/2ML IJ SOLN
INTRAMUSCULAR | Status: AC
  Filled 2017-11-06: qty 2

## 2017-11-06 MED ORDER — LIDOCAINE HCL (PF) 1 % IJ SOLN
INTRAMUSCULAR | Status: AC
  Filled 2017-11-06: qty 30

## 2017-11-06 MED ORDER — FAMOTIDINE IN NACL 20-0.9 MG/50ML-% IV SOLN
INTRAVENOUS | Status: AC | PRN
  Administered 2017-11-06: 20 mg via INTRAVENOUS

## 2017-11-06 MED ORDER — FAMOTIDINE IN NACL 20-0.9 MG/50ML-% IV SOLN
INTRAVENOUS | Status: AC
  Filled 2017-11-06: qty 50

## 2017-11-06 MED ORDER — SODIUM CHLORIDE 0.9 % IV SOLN: INTRAVENOUS | Status: AC

## 2017-11-06 MED ORDER — LABETALOL HCL 5 MG/ML IV SOLN: 10.0000 mg | INTRAVENOUS | Status: AC | PRN

## 2017-11-06 MED ORDER — MIDAZOLAM HCL 2 MG/2ML IJ SOLN
INTRAMUSCULAR | Status: DC | PRN
  Administered 2017-11-06 (×2): 1 mg via INTRAVENOUS

## 2017-11-06 MED ORDER — CLOPIDOGREL BISULFATE 75 MG PO TABS: 75.0000 mg | ORAL_TABLET | Freq: Every day | ORAL | 2 refills | Status: DC

## 2017-11-06 MED ORDER — FENTANYL CITRATE (PF) 100 MCG/2ML IJ SOLN
INTRAMUSCULAR | Status: DC | PRN
  Administered 2017-11-06: 50 ug via INTRAVENOUS
  Administered 2017-11-06 (×2): 25 ug via INTRAVENOUS

## 2017-11-06 MED ORDER — VERAPAMIL HCL 2.5 MG/ML IV SOLN
INTRAVENOUS | Status: DC | PRN
  Administered 2017-11-06 (×2): 10 mL via INTRA_ARTERIAL

## 2017-11-06 MED ORDER — ANGIOPLASTY BOOK
Freq: Once | Status: DC
  Filled 2017-11-06 (×2): qty 1

## 2017-11-06 MED ORDER — HEPARIN (PORCINE) IN NACL 2-0.9 UNIT/ML-% IJ SOLN
INTRAMUSCULAR | Status: AC | PRN
  Administered 2017-11-06: 500 mL
  Administered 2017-11-06: 1000 mL

## 2017-11-06 MED ORDER — NITROGLYCERIN 1 MG/10 ML FOR IR/CATH LAB
INTRA_ARTERIAL | Status: AC
  Filled 2017-11-06: qty 10

## 2017-11-06 MED ORDER — IOPAMIDOL (ISOVUE-370) INJECTION 76%
INTRAVENOUS | Status: DC | PRN
  Administered 2017-11-06: 145 mL via INTRAVENOUS

## 2017-11-06 MED ORDER — HEPARIN (PORCINE) IN NACL 2-0.9 UNIT/ML-% IJ SOLN
INTRAMUSCULAR | Status: AC
  Filled 2017-11-06: qty 500

## 2017-11-06 MED ORDER — NITROGLYCERIN 1 MG/10 ML FOR IR/CATH LAB
INTRA_ARTERIAL | Status: DC | PRN
  Administered 2017-11-06 (×2): 200 ug via INTRACORONARY

## 2017-11-06 MED ORDER — HYDRALAZINE HCL 20 MG/ML IJ SOLN: 5.0000 mg | INTRAMUSCULAR | Status: AC | PRN

## 2017-11-06 MED ORDER — VERAPAMIL HCL 2.5 MG/ML IV SOLN
INTRAVENOUS | Status: AC
  Filled 2017-11-06: qty 2

## 2017-11-06 MED FILL — CLOPIDOGREL 75 MG TABLET: 75 | 30 days supply | Qty: 30 | Fill #0

## 2017-11-06 SURGICAL SUPPLY — 18 items
BALLN SAPPHIRE 2.0X12 (BALLOONS) ×2
BALLN SAPPHIRE ~~LOC~~ 3.25X15 (BALLOONS) ×2 IMPLANT
BALLN WOLVERINE 2.75X10 (BALLOONS) ×2
BALLOON SAPPHIRE 2.0X12 (BALLOONS) ×1 IMPLANT
BALLOON WOLVERINE 2.75X10 (BALLOONS) ×1 IMPLANT
CATH IMPULSE 5F ANG/FL3.5 (CATHETERS) ×2 IMPLANT
CATH LAUNCHER 6FR EBU3.5 (CATHETERS) ×2 IMPLANT
DEVICE RAD COMP TR BAND LRG (VASCULAR PRODUCTS) ×2 IMPLANT
GLIDESHEATH SLEND SS 6F .021 (SHEATH) ×2 IMPLANT
GUIDEWIRE INQWIRE 1.5J.035X260 (WIRE) ×1 IMPLANT
INQWIRE 1.5J .035X260CM (WIRE) ×2
KIT ENCORE 26 ADVANTAGE (KITS) ×2 IMPLANT
KIT HEART LEFT (KITS) ×2 IMPLANT
PACK CARDIAC CATHETERIZATION (CUSTOM PROCEDURE TRAY) ×2 IMPLANT
STENT SYNERGY DES 3X20 (Permanent Stent) ×2 IMPLANT
TRANSDUCER W/STOPCOCK (MISCELLANEOUS) ×2 IMPLANT
TUBING CIL FLEX 10 FLL-RA (TUBING) ×2 IMPLANT
WIRE RUNTHROUGH .014X180CM (WIRE) ×2 IMPLANT

## 2017-11-06 NOTE — Discharge Summary (Signed)
Discharge Summary/SAME DAY PCI    Patient ID: Tracy [name],  MRN: [id number], DOB/AGE: December 13, 1945 72 y.o.  Admit date: 11/06/2017 Discharge date: 11/06/2017  Primary Care Provider: Marletta Lor Primary Cardiologist: Burt Knack   Discharge Diagnoses    Principal Problem:   Shortness of breath Active Problems:   Abnormal stress test  Allergies Allergies  Allergen Reactions  . Erythromycin Rash    But isn't certain  . Sulfamethoxazole Rash    Diagnostic Studies/Procedures    Cath: 11/06/17  Conclusion   Conclusions: 1. Mild to moderate coronary artery disease involving LAD and RCA, not significantly change since 2011/20112. 2. 50-60% in-stent restenosis in proximal portion of mid LCx stent, corresponding to area of ischemia on recent myocardial perfusion stress test. 3. Mildly elevated left ventricular filling pressure.. 4. Successful PCI to proximal/mid LCx with placement of a Synergy 3.0 x 20 mm DES with 0% residual stenosis and TIMI-3 flow.  Recommendations: 1. Dual antiplatelet therapy with aspirin and clopidogrel for at least 6 months. 2. Aggressive secondary prevention. 3. Anticipate same-day discharge if patient remains hemodynamically stable and asymptomatic.  [name], MD   _____________   History of Present Illness     72 y.o. female who presented to the office for follow-up of CAD. She has a hx of MI treated with bare metal stenting of the left circumflex in 2007. She recently has been followed closely by Ermalinda Barrios, who has treated her for diastolic heart failure. A nuclear stress test was ordered to evaluate continued chest pain. This study demonstrated inferolateral ischemia and the patient presented back for follow up to discuss further diagnostic and treatment options.   She complained of increased fatigue, dyspnea with exertion, and left-sided chest pain.  Her chest pain was sharp and nature.  It was brought on by stress and  sometimes by physical exertion.  It was relieved with rest.  She has had no substernal pressure.  She was short of breath with low level physical activity.  She complained of orthopnea, denies PND.  She has had leg swelling but this is been somewhat improved since she has discontinued amlodipine.  She denied heart palpitations. Given her symptoms and abnormal stress test she was set up for outpatient cath.   Hospital Course     Underwent cardiac cath noted above with mild to moderate disease in the LAD and RCA but no change since 2008. Also noted 50-60% ISR in the [place] with successful PCI/DESx1. Plan for DAPT with ASA/plavix for at least 6 months. On good medical therapy with home medications prior to cath. Seen by cardiac rehab while in short stay. Given precautions/restrictions prior to discharge. Radial cath site stable.   [name] was seen by Dr. Saunders Revel and determined stable for discharge home. Follow up in the office has been arranged. Medications are listed below.   _____________  Discharge Vitals Blood pressure (!) 119/91, pulse 72, temperature 98.1 F (36.7 C), temperature source Oral, resp. rate 19, height 5\' 5"  (1.651 m), weight 205 lb (93 kg), SpO2 96 %.  Filed Weights   11/06/17 0543  Weight: 205 lb (93 kg)    Labs & Radiologic Studies    CBC No results for input(s): WBC, NEUTROABS, HGB, HCT, MCV, PLT in the last 72 hours. Basic Metabolic Panel No results for input(s): NA, K, CL, CO2, GLUCOSE, BUN, CREATININE, CALCIUM, MG, PHOS in the last 72 hours. Liver Function Tests No results for input(s): AST, ALT, ALKPHOS, BILITOT,  PROT, ALBUMIN in the last 72 hours. No results for input(s): LIPASE, AMYLASE in the last 72 hours. Cardiac Enzymes No results for input(s): CKTOTAL, CKMB, CKMBINDEX, TROPONINI in the last 72 hours. BNP Invalid input(s): POCBNP D-Dimer No results for input(s): DDIMER in the last 72 hours. Hemoglobin A1C No results for input(s): HGBA1C in the  last 72 hours. Fasting Lipid Panel No results for input(s): CHOL, HDL, LDLCALC, TRIG, CHOLHDL, LDLDIRECT in the last 72 hours. Thyroid Function Tests No results for input(s): TSH, T4TOTAL, T3FREE, THYROIDAB in the last 72 hours.  Invalid input(s): FREET3 _____________  No results found. Disposition   Pt is being discharged home today in good condition.  Follow-up Plans & Appointments    Follow-up Information    Imogene Burn, PA-C Follow up on 11/14/2017.   Specialty:  Cardiology Why:  at 11am for your follow up appt.  Contact information: [address] [place] [address] [phone]          Discharge Instructions    AMB Referral to Cardiac Rehabilitation - Phase II   Complete by:  As directed    Diagnosis:  Coronary Stents   Amb Referral to Cardiac Rehabilitation   Complete by:  As directed    Diagnosis:  Coronary Stents   Call MD for:  redness, tenderness, or signs of infection (pain, swelling, redness, odor or green/yellow discharge around incision site)   Complete by:  As directed    Diet - low sodium heart healthy   Complete by:  As directed    Discharge instructions   Complete by:  As directed    Radial Site Care Refer to this sheet in the next few weeks. These instructions provide you with information on caring for yourself after your procedure. Your caregiver may also give you more specific instructions. Your treatment has been planned according to current medical practices, but problems sometimes occur. Call your caregiver if you have any problems or questions after your procedure. HOME CARE INSTRUCTIONS You may shower the day after the procedure.Remove the bandage (dressing) and gently wash the site with plain soap and water.Gently pat the site dry.  Do not apply powder or lotion to the site.  Do not submerge the affected site in water for 3 to 5 days.  Inspect the site at least twice daily.  Do not flex or bend the affected arm for  24 hours.  No lifting over 5 pounds (2.3 kg) for 5 days after your procedure.  Do not drive home if you are discharged the same day of the procedure. Have someone else drive you.  You may drive 24 hours after the procedure unless otherwise instructed by your caregiver.  What to expect: Any bruising will usually fade within 1 to 2 weeks.  Blood that collects in the tissue (hematoma) may be painful to the touch. It should usually decrease in size and tenderness within 1 to 2 weeks.  SEEK IMMEDIATE MEDICAL CARE IF: You have unusual pain at the radial site.  You have redness, warmth, swelling, or pain at the radial site.  You have drainage (other than a small amount of blood on the dressing).  You have chills.  You have a fever or persistent symptoms for more than 72 hours.  You have a fever and your symptoms suddenly get worse.  Your arm becomes pale, cool, tingly, or numb.  You have heavy bleeding from the site. Hold pressure on the site.   PLEASE DO NOT  MISS ANY DOSES OF YOUR PLAVIX!!!!! Also keep a log of you blood pressures and bring back to your follow up appt. Please call the office with any questions.   Patients taking blood thinners should generally stay away from medicines like ibuprofen, Advil, Motrin, naproxen, and Aleve due to risk of stomach bleeding. You may take Tylenol as directed or talk to your primary doctor about alternatives.  Some studies suggest Prilosec/Omeprazole interacts with Plavix. We changed your Prilosec/Omeprazole to the equivalent dose of Protonix for less chance of interaction.   Increase activity slowly   Complete by:  As directed       Discharge Medications     Medication List    TAKE these medications   allopurinol 100 MG tablet Commonly known as:  ZYLOPRIM TAKE 1 TABLET BY MOUTH EVERY DAY   ALPRAZolam 0.25 MG tablet Commonly known as:  XANAX TAKE 1 TABLET BY MOUTH TWICE A DAY AS NEEDED FOR ANXIETY   aspirin EC 81 MG tablet Take 81 mg by  mouth daily.   atorvastatin 40 MG tablet Commonly known as:  LIPITOR Take 1 tablet (40 mg total) daily by mouth. Please keep upcoming appt for future refills. Thanks What changed:    when to take this  additional instructions   CALCIUM 600 600 MG Tabs tablet Generic drug:  calcium carbonate Take 600 mg by mouth See admin instructions. Once every 2 weeks   cetirizine 10 MG tablet Commonly known as:  ZYRTEC Take 10 mg by mouth daily.   clopidogrel 75 MG tablet Commonly known as:  PLAVIX Take 1 tablet (75 mg total) by mouth daily with breakfast. Start taking on:  11/07/2017   CO Q 10 PO Take 300 mg by mouth See admin instructions. Once every 2 weeks.   FISH OIL OMEGA-3 PO Take 1,400 mg by mouth See admin instructions. Once every couple weeks   furosemide 20 MG tablet Commonly known as:  LASIX Take 40 mg by mouth daily.   HYDROcodone-acetaminophen 5-325 MG tablet Commonly known as:  NORCO/VICODIN TAKE 1 TABLET EVERY 6 HOURS AS NEEDED FOR PAIN   isosorbide mononitrate 30 MG 24 hr tablet Commonly known as:  IMDUR Take 1 tablet (30 mg total) daily by mouth.   losartan 100 MG tablet Commonly known as:  COZAAR TAKE 1 TABLET (100 MG TOTAL) BY MOUTH DAILY.   metoprolol tartrate 100 MG tablet Commonly known as:  LOPRESSOR TAKE 1 TABLET (100 MG TOTAL) BY MOUTH 2 (TWO) TIMES DAILY.   Na Sulfate-K Sulfate-Mg Sulf 17.5-3.13-1.6 GM/177ML Soln Suprep (no substitutions)-TAKE AS DIRECTED.   nitroGLYCERIN 0.4 MG SL tablet Commonly known as:  NITROSTAT Place 0.4 mg under the tongue every 5 (five) minutes as needed for chest pain.   pantoprazole 40 MG tablet Commonly known as:  PROTONIX TAKE 1 TABLET (40 MG TOTAL) BY MOUTH DAILY.   TART CHERRY ADVANCED Caps Take 1 capsule by mouth daily.   Vitamin B-12 5000 MCG Subl Place 5,000 mcg under the tongue daily.   vitamin C 1000 MG tablet Take 1,000 mg by mouth See admin instructions. Once every 2 weeks   VITAMIN D3 PO Take  1,000 Units by mouth daily.   vitamin E 200 UNIT capsule Take 200 Units by mouth daily.        Aspirin prescribed at discharge?  Yes High Intensity Statin Prescribed? (Lipitor 40-80mg  or Crestor 20-40mg ): Yes Beta Blocker Prescribed? Yes For EF <40%, was ACEI/ARB Prescribed? Yes ADP Receptor Inhibitor Prescribed? (i.e. Plavix etc.-Includes  Medically Managed Patients): Yes For EF <40%, Aldosterone Inhibitor Prescribed? No: EF ok Was EF assessed during THIS hospitalization? Yes Was Cardiac Rehab II ordered? (Included Medically managed Patients): Yes   Outstanding Labs/Studies   N/a   Duration of Discharge Encounter   Greater than 30 minutes including physician time.  Signed, Reino Bellis NP-C 11/06/2017, 2:14 PM

## 2017-11-06 NOTE — Progress Notes (Signed)
1020-100 Education completed with pt and husband who voiced understanding. Stressed importance of plavix with stent. Reviewed NTG use, gave heart healthy diet, ex ed and CRP 2. Pt has attended Center before. Will refer again to [place]. Graylon Good RN BSN 11/06/2017 10:59 AM

## 2017-11-06 NOTE — Brief Op Note (Signed)
BRIEF CARDIAC CATHETERIZATION NOTE  DATE: 11/06/2017 TIME: 9:10 AM  PATIENT:  Tracy [name]  72 y.o. female  PRE-OPERATIVE DIAGNOSIS:  Shortness of breath and abnormal stress test  POST-OPERATIVE DIAGNOSIS:  Same  PROCEDURE:  Procedure(s): LEFT HEART CATH AND CORONARY ANGIOGRAPHY (N/A) CORONARY STENT INTERVENTION (N/A)  SURGEON:  Surgeon(s) and Role:    * Isla Sabree, MD - Primary  FINDINGS: 1. Mild to moderate coronary artery disease involving LAD and RCA, not significantly change since 2008. 2. 50-60% in-stent restenosis in proximal portion of mid LCx stent. 3. Mildly elevated LVEDP. 4. Successful PCI to proximal/mid LCx with placement of a Synergy 3.0 x 20 mm DES with 0% residual stenosis and TIMI-3 flow.  RECOMMENDATIONS: 1. DAPT with aspirin and clopidogrel for at least 6 months. 2. Aggressive secondary prevention. 3. Anticipate same-day discharge if patient remains hemodynamically stable and asymptomatic.  [name], MD [facility] Pager: [phone]

## 2017-11-06 NOTE — Discharge Instructions (Signed)

## 2017-11-06 NOTE — Interval H&P Note (Signed)
History and Physical Interval Note:  11/06/2017 7:21 AM  Tracy [name]  has presented today for cardiac catheterization, with the diagnosis of shortness of breath and abnormal stress test. The various methods of treatment have been discussed with the patient and family. After consideration of risks, benefits and other options for treatment, the patient has consented to  Procedure(s): LEFT HEART CATH AND CORONARY ANGIOGRAPHY (N/A) as a surgical intervention .  The patient's history has been reviewed, patient examined, no change in status, stable for surgery.  I have reviewed the patient's chart and labs.  Questions were answered to the patient's satisfaction.    Cath Lab Visit (complete for each Cath Lab visit)  Clinical Evaluation Leading to the Procedure:   ACS: No.  Non-ACS:    Anginal Classification: CCS III  Anti-ischemic medical therapy: Maximal Therapy (2 or more classes of medications)  Non-Invasive Test Results: Intermediate-risk stress test findings: cardiac mortality 1-3%/year  Prior CABG: No previous CABG  Tracy [name]

## 2017-11-07 ENCOUNTER — Telehealth (HOSPITAL_COMMUNITY): Payer: Self-pay

## 2017-11-07 ENCOUNTER — Encounter (HOSPITAL_COMMUNITY): Payer: Self-pay | Admitting: Internal Medicine

## 2017-11-07 NOTE — Telephone Encounter (Signed)
Patients insurance is active and benefits verified through Medicare Part A & B - No co-pay, deductible amount of $185.00/$141.54 has been met, no out of pocket, 20% co-insurance, and no pre-authorization is required. Passport/reference [id number]  Patients insurance is active and benefits verified through [employer] [place] - No co-pay, no deductible, no out of pocket, no co-insurance, and no pre-authorization is required. Passport/reference [id number]  Patient will be contacted and scheduled after review by the RN Navigator.

## 2017-11-10 ENCOUNTER — Telehealth (HOSPITAL_COMMUNITY): Payer: Self-pay

## 2017-11-10 NOTE — Telephone Encounter (Signed)
Called and spoke with patient in regards to Cardiac Rehab - Patient is interested in the program. Scheduled orientation on 12/19/2017 at 8:30am. Patient will attend the 8:15am exc class.

## 2017-11-14 ENCOUNTER — Telehealth: Payer: Self-pay | Admitting: *Deleted

## 2017-11-14 ENCOUNTER — Ambulatory Visit (INDEPENDENT_AMBULATORY_CARE_PROVIDER_SITE_OTHER): Payer: Medicare Other | Admitting: Physician Assistant

## 2017-11-14 ENCOUNTER — Encounter: Payer: Self-pay | Admitting: Physician Assistant

## 2017-11-14 VITALS — BP 108/74 | HR 77 | Ht 65.0 in | Wt 206.1 lb

## 2017-11-14 DIAGNOSIS — I5032 Chronic diastolic (congestive) heart failure: Secondary | ICD-10-CM | POA: Diagnosis not present

## 2017-11-14 DIAGNOSIS — I10 Essential (primary) hypertension: Secondary | ICD-10-CM | POA: Diagnosis not present

## 2017-11-14 DIAGNOSIS — I2510 Atherosclerotic heart disease of native coronary artery without angina pectoris: Secondary | ICD-10-CM

## 2017-11-14 DIAGNOSIS — E7800 Pure hypercholesterolemia, unspecified: Secondary | ICD-10-CM

## 2017-11-14 LAB — BASIC METABOLIC PANEL
BUN/Creatinine Ratio: 23 (ref 12–28)
BUN: 28 mg/dL — ABNORMAL HIGH (ref 8–27)
CO2: 20 mmol/L (ref 20–29)
Calcium: 9.2 mg/dL (ref 8.7–10.3)
Chloride: 103 mmol/L (ref 96–106)
Creatinine, Ser: 1.2 mg/dL — ABNORMAL HIGH (ref 0.57–1.00)
GFR calc Af Amer: 53 mL/min/{1.73_m2} — ABNORMAL LOW (ref >59)
GFR calc non Af Amer: 46 mL/min/{1.73_m2} — ABNORMAL LOW (ref >59)
Glucose: 97 mg/dL (ref 65–99)
Potassium: 4.6 mmol/L (ref 3.5–5.2)
Sodium: 143 mmol/L (ref 134–144)

## 2017-11-14 MED ORDER — FUROSEMIDE 20 MG PO TABS: 20.0000 mg | ORAL_TABLET | Freq: Every day | ORAL | 3 refills | Status: DC

## 2017-11-14 MED ORDER — NITROGLYCERIN 0.4 MG SL SUBL: 0.4000 mg | SUBLINGUAL_TABLET | SUBLINGUAL | 3 refills | Status: DC | PRN

## 2017-11-14 MED ORDER — CLOPIDOGREL BISULFATE 75 MG PO TABS: 75.0000 mg | ORAL_TABLET | Freq: Every day | ORAL | 2 refills | Status: DC

## 2017-11-14 NOTE — Progress Notes (Signed)
Cardiology Office Note    Date:  11/14/2017   ID:  Tracy [name], Tracy [name] [birth year], MRN [id number]  PCP:  Marletta Lor, MD  Cardiologist: Sherren Mocha, MD  Chief Complaint  Patient presents with  . Follow-up    History of Present Illness:  Tracy [name] is a 72 y.o. female with history of CAD status post MI treated with BMS to the circumflex in 2007.  She was having recurrent chest pain sharp in nature brought on by stress and sometimes physical exertion and nuclear stress test demonstrated inferolateral ischemia so cardiac catheterization was done.  She underwent successful DES to the circumflex, she had mild to moderate disease in the LAD and RCA but no change in 2008.  Plan was for aspirin and Plavix for at least 6 months.  Plans on cardiac rehab.  Patient also has history of mild LV dysfunction ejection fraction 45-50% 08/2016 with recent heart failure symptoms improved with increasing Lasix to 40 mg daily.  2D echo 09/2017 LVEF 50-55% with grade 1 DD.  Diet seems to be a contributor to her edema.  Patient comes in today accompanied by her husband.  She is feeling a lot better.  No further chest pain and breathing is better.  She says she is watching her diet and not adding salt but still eating a lot of processed foods.    Past Medical History:  Diagnosis Date  . Allergic rhinitis   . CAD (coronary artery disease)    1/19 PCI/DES to [facility] for ISR, normal EF.   Marland Kitchen CHF (congestive heart failure) ([place])   . Gout   . Headache(784.0)   . Hyperlipidemia   . Hypertension   . Low back pain   . Menopausal syndrome   . Myocardial infarct ([place]) 2007   hx of  . Overactive bladder     Past Surgical History:  Procedure Laterality Date  . ANGIOPLASTY     stent 2007  . COLONOSCOPY    . CORONARY STENT INTERVENTION N/A 11/06/2017   Procedure: CORONARY STENT INTERVENTION;  Surgeon: [name], MD;  Location: [facility];  Service: Cardiovascular;   Laterality: N/A;  . LEFT HEART CATH AND CORONARY ANGIOGRAPHY N/A 11/06/2017   Procedure: LEFT HEART CATH AND CORONARY ANGIOGRAPHY;  Surgeon: [name], MD;  Location: [facility];  Service: Cardiovascular;  Laterality: N/A;  . TONSILLECTOMY      Current Medications: Current Meds  Medication Sig  . allopurinol (ZYLOPRIM) 100 MG tablet TAKE 1 TABLET BY MOUTH EVERY DAY  . ALPRAZolam (XANAX) 0.25 MG tablet TAKE 1 TABLET BY MOUTH TWICE A DAY AS NEEDED FOR ANXIETY  . Ascorbic Acid (VITAMIN C) 1000 MG tablet Take 1,000 mg by mouth See admin instructions. Once every 2 weeks  . aspirin EC 81 MG tablet Take 81 mg by mouth daily.  Marland Kitchen atorvastatin (LIPITOR) 40 MG tablet Take 1 tablet (40 mg total) daily by mouth. Please keep upcoming appt for future refills. Thanks (Patient taking differently: Take 40 mg by mouth every evening. Please keep upcoming appt for future refills. Thanks)  . calcium carbonate (CALCIUM 600) 600 MG TABS tablet Take 600 mg by mouth See admin instructions. Once every 2 weeks  . cetirizine (ZYRTEC) 10 MG tablet Take 10 mg by mouth daily.  . Cholecalciferol (VITAMIN D3 PO) Take 1,000 Units by mouth daily.  . clopidogrel (PLAVIX) 75 MG tablet Take 1 tablet (75 mg total) by mouth daily with breakfast.  .  Coenzyme Q10 (CO Q 10 PO) Take 300 mg by mouth See admin instructions. Once every 2 weeks.  . Cyanocobalamin (VITAMIN B-12) 5000 MCG SUBL Place 5,000 mcg under the tongue daily.  . furosemide (LASIX) 20 MG tablet Take 40 mg by mouth daily.   Marland Kitchen HYDROcodone-acetaminophen (NORCO/VICODIN) 5-325 MG tablet TAKE 1 TABLET EVERY 6 HOURS AS NEEDED FOR PAIN  . losartan (COZAAR) 100 MG tablet TAKE 1 TABLET (100 MG TOTAL) BY MOUTH DAILY.  . metoprolol tartrate (LOPRESSOR) 100 MG tablet TAKE 1 TABLET (100 MG TOTAL) BY MOUTH 2 (TWO) TIMES DAILY.  Marland Kitchen Misc Natural Products (TART CHERRY ADVANCED) CAPS Take 1 capsule by mouth daily.  . Na Sulfate-K Sulfate-Mg Sulf 17.5-3.13-1.6 GM/177ML SOLN  Suprep (no substitutions)-TAKE AS DIRECTED.  Marland Kitchen nitroGLYCERIN (NITROSTAT) 0.4 MG SL tablet Place 1 tablet (0.4 mg total) under the tongue every 5 (five) minutes as needed for chest pain.  . Omega-3 Fatty Acids (FISH OIL OMEGA-3 PO) Take 1,400 mg by mouth See admin instructions. Once every couple weeks  . pantoprazole (PROTONIX) 40 MG tablet TAKE 1 TABLET (40 MG TOTAL) BY MOUTH DAILY.  . vitamin E 200 UNIT capsule Take 200 Units by mouth daily.  . [DISCONTINUED] clopidogrel (PLAVIX) 75 MG tablet Take 1 tablet (75 mg total) by mouth daily with breakfast.  . [DISCONTINUED] isosorbide mononitrate (IMDUR) 30 MG 24 hr tablet Take 1 tablet (30 mg total) daily by mouth.  . [DISCONTINUED] nitroGLYCERIN (NITROSTAT) 0.4 MG SL tablet Place 0.4 mg under the tongue every 5 (five) minutes as needed for chest pain.     Allergies:   Erythromycin and Sulfamethoxazole   Social History   Socioeconomic History  . Marital status: Married    Spouse name: None  . Number of children: None  . Years of education: None  . Highest education level: None  Social Needs  . Financial resource strain: None  . Food insecurity - worry: None  . Food insecurity - inability: None  . Transportation needs - medical: None  . Transportation needs - non-medical: None  Occupational History  . Occupation: direct Civil engineer, contracting  Tobacco Use  . Smoking status: Former Smoker    Last attempt to quit: 07/01/2012    Years since quitting: 5.3  . Smokeless tobacco: Never Used  Substance and Sexual Activity  . Alcohol use: No  . Drug use: No  . Sexual activity: None  Other Topics Concern  . None  Social History Narrative  . None     Family History:  The patient's family history includes Colon cancer (age of onset: 56) in her mother; Other in her unknown relative. She was adopted.   ROS:   Please see the history of present illness.    Review of Systems  Constitution: Negative.  HENT: Negative.   Eyes: Negative.     Cardiovascular: Negative.   Respiratory: Negative.   Hematologic/Lymphatic: Negative.   Musculoskeletal: Negative.  Negative for joint pain.  Gastrointestinal: Negative.   Genitourinary: Negative.   Neurological: Negative.    All other systems reviewed and are negative.   PHYSICAL EXAM:   VS:  BP 108/74   Pulse 77   Ht 5\' 5"  (1.651 m)   Wt 206 lb 1.9 oz (93.5 kg)   BMI 34.30 kg/m   Physical Exam  GEN: Obese, in no acute distress  Neck: no JVD, carotid bruits, or masses Cardiac:RRR; no murmurs, rubs, or gallops  Respiratory:  clear to auscultation bilaterally, normal work of breathing  GI: soft, nontender, nondistended, + BS Ext: Right arm at cath site without hematoma or hemorrhage, good radial brachial pulse, lower extremities without cyanosis, clubbing, or edema, Good distal pulses bilaterally Neuro:  Alert and Oriented x 3 Psych: euthymic mood, full affect  Wt Readings from Last 3 Encounters:  11/14/17 206 lb 1.9 oz (93.5 kg)  11/06/17 205 lb (93 kg)  10/20/17 205 lb (93 kg)      Studies/Labs Reviewed:   EKG:  EKG is ordered today.  The ekg ordered today demonstrates normal sinus rhythm, normal EKG  Recent Labs: 07/31/2017: ALT 37 09/04/2017: NT-Pro BNP 480 11/03/2017: BUN 29; Creatinine, Ser 1.26; Hemoglobin 11.8; Platelets 213; Potassium 4.6; Sodium 141   Lipid Panel    Component Value Date/Time   CHOL 154 09/13/2016 0738   TRIG 175 (H) 09/13/2016 0738   HDL 31 (L) 09/13/2016 0738   CHOLHDL 5.0 (H) 09/13/2016 0738   VLDL 35 (H) 09/13/2016 0738   LDLCALC 88 09/13/2016 0738   LDLDIRECT 111.0 01/05/2015 1019    Additional studies/ records that were reviewed today include: Cath: 11/06/17   Conclusion    Conclusions: 1. Mild to moderate coronary artery disease involving LAD and RCA, not significantly change since 2011/20112. 2. 50-60% in-stent restenosis in proximal portion of mid LCx stent, corresponding to area of ischemia on recent myocardial perfusion  stress test. 3. Mildly elevated left ventricular filling pressure.. 4. Successful PCI to proximal/mid LCx with placement of a Synergy 3.0 x 20 mm DES with 0% residual stenosis and TIMI-3 flow.   Recommendations: 1. Dual antiplatelet therapy with aspirin and clopidogrel for at least 6 months. 2. Aggressive secondary prevention. 3. Anticipate same-day discharge if patient remains hemodynamically stable and asymptomatic.   [name], MD    Nuclear stress EF: 45%.  There was no ST segment deviation noted during stress.  Findings consistent with prior myocardial infarction.  This is an intermediate risk study.  The left ventricular ejection fraction is mildly decreased (45-54%).   1. EF 45%, diffuse hypokinesis.  2. Fixed medium-sized, moderate intensity basal to mid inferolateral and anterolateral perfusion defect. This suggests prior infarction with minimal ischemia.  3. Overall intermediate risk study with low EF and perfusion defect, but no ischemia noted.    2D echo 09/2017 Study Conclusions   - Left ventricle: The cavity size was normal. Wall thickness was   increased in a pattern of mild LVH. Systolic function was normal.   The estimated ejection fraction was in the range of 50% to 55%.   Wall motion was normal; there were no regional wall motion   abnormalities. Doppler parameters are consistent with abnormal   left ventricular relaxation (grade 1 diastolic dysfunction).        ASSESSMENT:    1. Coronary artery disease involving native coronary artery of native heart without angina pectoris   2. Chronic diastolic CHF (congestive heart failure) ([place])   3. Essential hypertension   4. Pure hypercholesterolemia      PLAN:  In order of problems listed above:  CAD status post DES to the circumflex 10/25/17 with residual nonobstructive disease in the LAD and RCA.  Prior MI and BMS to the circumflex 2007.  Blood pressure on the low side.  We will stop Imdur.   Follow-up with Dr. Burt Knack in 2 months  Chronic diastolic CHF creatinine bumped up on higher dose Lasix.  2 g sodium diet reiterated.  Last echo LVEF 50-55% with grade 1 DD.  Creatinine 1.26 at  discharge we will recheck today.  If she can follow low-salt diet we may be able to decrease her Lasix to 20 mg daily.  Essential hypertension blood pressure well controlled, stopping Imdur  Hypercholesterolemia on Lipitor managed by primary care.    Medication Adjustments/Labs and Tests Ordered: Current medicines are reviewed at length with the patient today.  Concerns regarding medicines are outlined above.  Medication changes, Labs and Tests ordered today are listed in the Patient Instructions below. Patient Instructions  Medication Instructions:  Your physician recommends that you continue on your current medications as directed. Please refer to the Current Medication list given to you today.   Labwork: None ordered  Testing/Procedures: None ordered  Follow-Up: Your physician recommends that you schedule a follow-up appointment in:    Any Other Special Instructions Will Be Listed Below (If Applicable).    If you need a refill on your cardiac medications before your next appointment, please call your pharmacy.      Sumner Boast, PA-C  11/14/2017 11:19 AM    [facility] [place], [place],   [address] Phone: [phone]; Fax: [phone]

## 2017-11-14 NOTE — Telephone Encounter (Signed)
-----   Message from Imogene Burn, PA-C sent at 11/14/2017  3:54 PM EST ----- Kidney function improved some.  She can try to decrease her Lasix to 20 mg daily if she stays on 2 g sodium diet.  Let us know if she has any edema or shortness of breath on the lower dose.

## 2017-11-14 NOTE — Patient Instructions (Addendum)
Medication Instructions:  Your physician has recommended you make the following change in your medication:  1.  STOP the Imdur   Labwork: TODAY:  BMET  Testing/Procedures: None ordered  Follow-Up: Your physician recommends that you schedule a follow-up appointment in: 2 [place]   Any Other Special Instructions Will Be Listed Below (If Applicable).  DASH Eating Plan DASH stands for "Dietary Approaches to Stop Hypertension." The DASH eating plan is a healthy eating plan that has been shown to reduce high blood pressure (hypertension). It may also reduce your risk for type 2 diabetes, heart disease, and stroke. The DASH eating plan may also help with weight loss. What are tips for following this plan? General guidelines  Avoid eating more than 2,300 mg (milligrams) of salt (sodium) a day. If you have hypertension, you may need to reduce your sodium intake to 1,500 mg a day.  Limit alcohol intake to no more than 1 drink a day for nonpregnant women and 2 drinks a day for men. One drink equals 12 oz of beer, 5 oz of wine, or 1 oz of hard liquor.  Work with your health care provider to maintain a healthy body weight or to lose weight. Ask what an ideal weight is for you.  Get at least 30 minutes of exercise that causes your heart to beat faster (aerobic exercise) most days of the week. Activities may include walking, swimming, or biking.  Work with your health care provider or diet and nutrition specialist (dietitian) to adjust your eating plan to your individual calorie needs. Reading food labels  Check food labels for the amount of sodium per serving. Choose foods with less than 5 percent of the Daily Value of sodium. Generally, foods with less than 300 mg of sodium per serving fit into this eating plan.  To find whole grains, look for the word "whole" as the first word in the ingredient list. Shopping  Buy products labeled as "low-sodium" or "no salt added."  Buy  fresh foods. Avoid canned foods and premade or frozen meals. Cooking  Avoid adding salt when cooking. Use salt-free seasonings or herbs instead of table salt or sea salt. Check with your health care provider or pharmacist before using salt substitutes.  Do not fry foods. Cook foods using healthy methods such as baking, boiling, grilling, and broiling instead.  Cook with heart-healthy oils, such as olive, canola, soybean, or sunflower oil. Meal planning   Eat a balanced diet that includes: ? 5 or more servings of fruits and vegetables each day. At each meal, try to fill half of your plate with fruits and vegetables. ? Up to 6-8 servings of whole grains each day. ? Less than 6 oz of lean meat, poultry, or fish each day. A 3-oz serving of meat is about the same size as a deck of cards. One egg equals 1 oz. ? 2 servings of low-fat dairy each day. ? A serving of nuts, seeds, or beans 5 times each week. ? Heart-healthy fats. Healthy fats called Omega-3 fatty acids are found in foods such as flaxseeds and coldwater fish, like sardines, salmon, and mackerel.  Limit how much you eat of the following: ? Canned or prepackaged foods. ? Food that is high in trans fat, such as fried foods. ? Food that is high in saturated fat, such as fatty meat. ? Sweets, desserts, sugary drinks, and other foods with added sugar. ? Full-fat dairy products.  Do not salt foods before eating.  Try to eat at least 2 vegetarian meals each week.  Eat more home-cooked food and less restaurant, buffet, and fast food.  When eating at a restaurant, ask that your food be prepared with less salt or no salt, if possible. What foods are recommended? The items listed may not be a complete list. Talk with your dietitian about what dietary choices are best for you. Grains Whole-grain or whole-wheat bread. Whole-grain or whole-wheat pasta. Brown rice. [place] Morrow. Bulgur. Whole-grain and low-sodium cereals. Pita bread.  Low-fat, low-sodium crackers. Whole-wheat flour tortillas. Vegetables Fresh or frozen vegetables (raw, steamed, roasted, or grilled). Low-sodium or reduced-sodium tomato and vegetable juice. Low-sodium or reduced-sodium tomato sauce and tomato paste. Low-sodium or reduced-sodium canned vegetables. Fruits All fresh, dried, or frozen fruit. Canned fruit in natural juice (without added sugar). Meat and other protein foods Skinless chicken or [place]. Ground chicken or [place]. Pork with fat trimmed off. Fish and seafood. Egg whites. Dried beans, peas, or lentils. Unsalted nuts, nut butters, and seeds. Unsalted canned beans. Lean cuts of beef with fat trimmed off. Low-sodium, lean deli meat. Dairy Low-fat (1%) or fat-free (skim) milk. Fat-free, low-fat, or reduced-fat cheeses. Nonfat, low-sodium ricotta or cottage cheese. Low-fat or nonfat yogurt. Low-fat, low-sodium cheese. Fats and oils Soft margarine without trans fats. Vegetable oil. Low-fat, reduced-fat, or light mayonnaise and salad dressings (reduced-sodium). Canola, safflower, olive, soybean, and sunflower oils. Avocado. Seasoning and other foods Herbs. Spices. Seasoning mixes without salt. Unsalted popcorn and pretzels. Fat-free sweets. What foods are not recommended? The items listed may not be a complete list. Talk with your dietitian about what dietary choices are best for you. Grains Baked goods made with fat, such as croissants, muffins, or some breads. Dry pasta or rice meal packs. Vegetables Creamed or fried vegetables. Vegetables in a cheese sauce. Regular canned vegetables (not low-sodium or reduced-sodium). Regular canned tomato sauce and paste (not low-sodium or reduced-sodium). Regular tomato and vegetable juice (not low-sodium or reduced-sodium). Angie Fava. Olives. Fruits Canned fruit in a light or heavy syrup. Fried fruit. Fruit in cream or butter sauce. Meat and other protein foods Fatty cuts of meat. Ribs. Fried meat. Berniece Salines.  Sausage. Bologna and other processed lunch meats. Salami. Fatback. Hotdogs. Bratwurst. Salted nuts and seeds. Canned beans with added salt. Canned or smoked fish. Whole eggs or egg yolks. Chicken or [place] with skin. Dairy Whole or 2% milk, cream, and half-and-half. Whole or full-fat cream cheese. Whole-fat or sweetened yogurt. Full-fat cheese. Nondairy creamers. Whipped toppings. Processed cheese and cheese spreads. Fats and oils Butter. Stick margarine. Lard. Shortening. Ghee. Bacon fat. Tropical oils, such as coconut, palm kernel, or palm oil. Seasoning and other foods Salted popcorn and pretzels. Onion salt, garlic salt, seasoned salt, table salt, and sea salt. Worcestershire sauce. Tartar sauce. Barbecue sauce. Teriyaki sauce. Soy sauce, including reduced-sodium. Steak sauce. Canned and packaged gravies. Fish sauce. Oyster sauce. Cocktail sauce. Horseradish that you find on the shelf. Ketchup. Mustard. Meat flavorings and tenderizers. Bouillon cubes. Hot sauce and Tabasco sauce. Premade or packaged marinades. Premade or packaged taco seasonings. Relishes. Regular salad dressings. Where to find more information:  National Heart, Lung, and [place]: https://wilson-eaton.com/  American Heart Association: www.heart.org Summary  The DASH eating plan is a healthy eating plan that has been shown to reduce high blood pressure (hypertension). It may also reduce your risk for type 2 diabetes, heart disease, and stroke.  With the DASH eating plan, you should limit salt (sodium) intake to 2,300 mg a day. If  you have hypertension, you may need to reduce your sodium intake to 1,500 mg a day.  When on the DASH eating plan, aim to eat more fresh fruits and vegetables, whole grains, lean proteins, low-fat dairy, and heart-healthy fats.  Work with your health care provider or diet and nutrition specialist (dietitian) to adjust your eating plan to your individual calorie needs. This information is not intended  to replace advice given to you by your health care provider. Make sure you discuss any questions you have with your health care provider. Document Released: 09/22/2011 Document Revised: 09/26/2016 Document Reviewed: 09/26/2016 Elsevier Interactive Patient Education  [employer].    If you need a refill on your cardiac medications before your next appointment, please call your pharmacy.

## 2017-11-19 ENCOUNTER — Other Ambulatory Visit: Payer: Self-pay | Admitting: Cardiovascular Disease

## 2017-11-21 ENCOUNTER — Encounter: Payer: Medicare Other | Admitting: Internal Medicine

## 2017-12-13 ENCOUNTER — Other Ambulatory Visit: Payer: Self-pay | Admitting: Cardiology

## 2017-12-18 ENCOUNTER — Telehealth (HOSPITAL_COMMUNITY): Payer: Self-pay

## 2017-12-18 NOTE — Telephone Encounter (Signed)
Cardiac Rehab Medication Review by a Pharmacist  Does the patient  feel that his/her medications are working for him/her?  yes  Has the patient been experiencing any side effects to the medications prescribed?  no  Does the patient measure his/her own blood pressure or blood glucose at home?  yes   Does the patient have any problems obtaining medications due to transportation or finances?   no  Understanding of regimen: good Understanding of indications: good Potential of compliance: good    Pharmacist comments: Patient seems to have a good understanding of her med regimen. Patient reports taking her prescription meds regularly but her herbals/supplements only sometimes. No issues with transportation or finances at this time.    Shellie Rogoff A Breon Rehm 12/18/2017 9:22 AM

## 2017-12-19 ENCOUNTER — Encounter (HOSPITAL_COMMUNITY)
Admission: RE | Admit: 2017-12-19 | Discharge: 2017-12-19 | Disposition: A | Discharge status: Home / Self Care | Payer: Medicare Other | Source: Ambulatory Visit | Attending: Cardiovascular Disease | Admitting: Cardiovascular Disease

## 2017-12-19 ENCOUNTER — Encounter (HOSPITAL_COMMUNITY): Payer: Self-pay

## 2017-12-19 VITALS — BP 142/82 | HR 94 | Ht 66.25 in | Wt 208.1 lb

## 2017-12-19 DIAGNOSIS — Z955 Presence of coronary angioplasty implant and graft: Secondary | ICD-10-CM | POA: Insufficient documentation

## 2017-12-19 DIAGNOSIS — E785 Hyperlipidemia, unspecified: Secondary | ICD-10-CM | POA: Diagnosis not present

## 2017-12-19 DIAGNOSIS — Z87891 Personal history of nicotine dependence: Secondary | ICD-10-CM | POA: Insufficient documentation

## 2017-12-19 DIAGNOSIS — I509 Heart failure, unspecified: Secondary | ICD-10-CM | POA: Insufficient documentation

## 2017-12-19 DIAGNOSIS — Z79899 Other long term (current) drug therapy: Secondary | ICD-10-CM | POA: Diagnosis not present

## 2017-12-19 DIAGNOSIS — I110 Hypertensive heart disease with heart failure: Secondary | ICD-10-CM | POA: Diagnosis not present

## 2017-12-19 NOTE — Progress Notes (Signed)
Tracy [name] 72 y.o. female DOB: [birth year] MRN: [id number]      Nutrition Note  Dx: s/p PCI/DES Past Medical History:  Diagnosis Date  . Allergic rhinitis   . CAD (coronary artery disease)    1/19 PCI/DES to [facility] for ISR, normal EF.   Marland Kitchen CHF (congestive heart failure) ([place])   . Gout   . Headache(784.0)   . Hyperlipidemia   . Hypertension   . Low back pain   . Menopausal syndrome   . Myocardial infarct ([place]) 2007   hx of  . Overactive bladder    Meds reviewed.  HT: Ht Readings from Last 1 Encounters:  11/14/17 5\' 5"  (1.651 m)    WT: Wt Readings from Last 3 Encounters:  11/14/17 206 lb 1.9 oz (93.5 kg)  11/06/17 205 lb (93 kg)  10/20/17 205 lb (93 kg)     BMI 34.3   Current tobacco use? No   Labs:  Lipid Panel     Component Value Date/Time   CHOL 154 09/13/2016 0738   TRIG 175 (H) 09/13/2016 0738   HDL 31 (L) 09/13/2016 0738   CHOLHDL 5.0 (H) 09/13/2016 0738   VLDL 35 (H) 09/13/2016 0738   LDLCALC 88 09/13/2016 0738   LDLDIRECT 111.0 01/05/2015 1019   No results found for: HGBA1C CBG (last 3)  No results for input(s): GLUCAP in the last 72 hours.  Nutrition Note Spoke with pt. Nutrition plan and goals reviewed with pt. Pt is following Step 2 of the Therapeutic Lifestyle Changes diet. Pt wants to lose wt. Pt has not been actively trying to lose wt and hopes exercise will help her lose wt. Wt loss tips reviewed.  Pt with dx of CHF. Per discussion, pt has been watching her sodium intake closely. Pt expressed understanding of the information reviewed. Pt aware of nutrition education classes offered and plans on attending nutrition classes.  Nutrition Diagnosis ? Food-and nutrition-related knowledge deficit related to lack of exposure to information as related to diagnosis of: ? CVD  ? Obesity related to excessive energy intake as evidenced by a BMI of 34.3  Nutrition Intervention ? Pt's individual nutrition plan and goals reviewed with pt.  Nutrition  Goal(s):  ? Pt to identify food quantities necessary to achieve weight loss of 6-24 lb (2.7-10.9 kg) at graduation from cardiac rehab.   Plan:  Pt to attend nutrition classes ? Nutrition I ? Nutrition II ? Portion Distortion  Will provide client-centered nutrition education as part of interdisciplinary care.   Monitor and evaluate progress toward nutrition goal with team.  Derek Mound, M.Ed, RD, LDN, CDE 12/19/2017 9:00 AM

## 2017-12-19 NOTE — Progress Notes (Signed)
Cardiac Individual Treatment Plan  Patient Details  Name: Tracy [name] MRN: [id number] Date of Birth: 01-09-1946 Referring Provider:     CARDIAC REHAB PHASE II ORIENTATION from 12/19/2017 in [facility]  Referring Provider  Sherren Mocha MD      Initial Encounter Date:    CARDIAC REHAB PHASE II ORIENTATION from 12/19/2017 in [place]  Date  12/19/17  Referring Provider  Sherren Mocha MD      Visit Diagnosis: Status post coronary artery stent placement 11/06/17  S/P DES CFX   Patient's Home Medications on Admission:  Current Outpatient Medications:  .  allopurinol (ZYLOPRIM) 100 MG tablet, TAKE 1 TABLET BY MOUTH EVERY DAY, Disp: 90 tablet, Rfl: 0 .  ALPRAZolam (XANAX) 0.25 MG tablet, TAKE 1 TABLET BY MOUTH TWICE A DAY AS NEEDED FOR ANXIETY (Patient not taking: Reported on 12/18/2017), Disp: 60 tablet, Rfl: 5 .  Ascorbic Acid (VITAMIN C) 1000 MG tablet, Take 1,000 mg by mouth See admin instructions. Once every 2 weeks, Disp: , Rfl:  .  aspirin EC 81 MG tablet, Take 81 mg by mouth daily., Disp: , Rfl:  .  atorvastatin (LIPITOR) 40 MG tablet, TAKE 1 TABLET DAILY BY MOUTH. PLEASE KEEP UPCOMING APPT FOR FUTURE REFILLS. THANKS, Disp: 90 tablet, Rfl: 3 .  calcium carbonate (CALCIUM 600) 600 MG TABS tablet, Take 600 mg by mouth See admin instructions. Once every 2 weeks, Disp: , Rfl:  .  cetirizine (ZYRTEC) 10 MG tablet, Take 10 mg by mouth daily., Disp: , Rfl:  .  Cholecalciferol (VITAMIN D3 PO), Take 1,000 Units by mouth daily., Disp: , Rfl:  .  clopidogrel (PLAVIX) 75 MG tablet, Take 1 tablet (75 mg total) by mouth daily with breakfast., Disp: 90 tablet, Rfl: 2 .  Coenzyme Q10 (CO Q 10 PO), Take 300 mg by mouth See admin instructions. Once every 2 weeks., Disp: , Rfl:  .  Cyanocobalamin (VITAMIN B-12) 5000 MCG SUBL, Place 5,000 mcg under the tongue daily., Disp: , Rfl:  .  furosemide (LASIX) 20 MG tablet, Take 1 tablet  (20 mg total) by mouth daily., Disp: 90 tablet, Rfl: 3 .  HYDROcodone-acetaminophen (NORCO/VICODIN) 5-325 MG tablet, TAKE 1 TABLET EVERY 6 HOURS AS NEEDED FOR PAIN, Disp: 60 tablet, Rfl: 0 .  losartan (COZAAR) 100 MG tablet, TAKE 1 TABLET (100 MG TOTAL) BY MOUTH DAILY., Disp: 90 tablet, Rfl: 3 .  metoprolol tartrate (LOPRESSOR) 100 MG tablet, TAKE 1 TABLET (100 MG TOTAL) BY MOUTH 2 (TWO) TIMES DAILY., Disp: 180 tablet, Rfl: 1 .  Misc Natural Products (TART CHERRY ADVANCED) CAPS, Take 1 capsule by mouth daily., Disp: , Rfl:  .  Na Sulfate-K Sulfate-Mg Sulf 17.5-3.13-1.6 GM/177ML SOLN, Suprep (no substitutions)-TAKE AS DIRECTED. (Patient not taking: Reported on 12/18/2017), Disp: 354 mL, Rfl: 0 .  nitroGLYCERIN (NITROSTAT) 0.4 MG SL tablet, Place 1 tablet (0.4 mg total) under the tongue every 5 (five) minutes as needed for chest pain., Disp: 25 tablet, Rfl: 3 .  Omega-3 Fatty Acids (FISH OIL OMEGA-3 PO), Take 1,400 mg by mouth See admin instructions. Once every couple weeks, Disp: , Rfl:  .  pantoprazole (PROTONIX) 40 MG tablet, TAKE 1 TABLET (40 MG TOTAL) BY MOUTH DAILY., Disp: 90 tablet, Rfl: 1 .  vitamin E 200 UNIT capsule, Take 200 Units by mouth daily., Disp: , Rfl:   Past Medical History: Past Medical History:  Diagnosis Date  . Allergic rhinitis   . CAD (coronary  artery disease)    1/19 PCI/DES to [facility] for ISR, normal EF.   Marland Kitchen CHF (congestive heart failure) ([place])   . Gout   . Headache(784.0)   . Hyperlipidemia   . Hypertension   . Low back pain   . Menopausal syndrome   . Myocardial infarct ([place]) 2007   hx of  . Overactive bladder     Tobacco Use: Social History   Tobacco Use  Smoking Status Former Smoker  . Last attempt to quit: 07/01/2012  . Years since quitting: 5.4  Smokeless Tobacco Never Used    Labs: Recent Review Flowsheet Data    Labs for ITP Cardiac and Pulmonary Rehab Latest Ref Rng & Units 01/05/2015 06/19/2015 10/05/2015 04/05/2016 09/13/2016   Cholestrol <200  mg/dL 180 193 164 190 154   LDLCALC <100 mg/dL - 121(H) 92 119 88   LDLDIRECT mg/dL 111.0 - - - -   HDL >50 mg/dL 32.20(L) 34.00(L) 34(L) 41(L) 31(L)   Trlycerides <150 mg/dL 294.0(H) 189.0(H) 188(H) 150(H) 175(H)      Capillary Blood Glucose: No results found for: GLUCAP   Exercise Target Goals: Date: 12/19/17  Exercise Program Goal: Individual exercise prescription set using results from initial 6 min walk test and THRR while considering  patient's activity barriers and safety.   Exercise Prescription Goal: Initial exercise prescription builds to 30-45 minutes a day of aerobic activity, 2-3 days per week.  Home exercise guidelines will be given to patient during program as part of exercise prescription that the participant will acknowledge.  Activity Barriers & Risk Stratification: Activity Barriers & Cardiac Risk Stratification - 12/19/17 0914      Activity Barriers & Cardiac Risk Stratification   Activity Barriers  Arthritis;Joint Problems;Deconditioning;Muscular Weakness;Back Problems;Other (comment);Balance Concerns;Shortness of Breath       6 Minute Walk: 6 Minute Walk    Row Name 12/19/17 1031 12/19/17 1056       6 Minute Walk   Distance  838 feet  -    Walk Time  6 minutes  -    # of Rest Breaks  0  -    MPH  1.5  -    METS  2.1  -    RPE  13  -    VO2 Peak  7.4  -    Symptoms  No  -    Resting HR  94 bpm  -    Resting BP  142/82  -    Resting Oxygen Saturation   96 %  -    Exercise Oxygen Saturation  during 6 min walk  95 %  -    Max Ex. HR  109 bpm  -    Max Ex. BP  160/82  -    2 Minute Post BP  104/70  120/78       Oxygen Initial Assessment:   Oxygen Re-Evaluation:   Oxygen Discharge (Final Oxygen Re-Evaluation):   Initial Exercise Prescription: Initial Exercise Prescription - 12/19/17 1000      Date of Initial Exercise RX and Referring Provider   Date  12/19/17    Referring Provider  Sherren Mocha MD      Treadmill   MPH  1.5     Grade  0    Minutes  10    METs  2.15      Recumbant Bike   Level  1.5    Watts  5    Minutes  10    METs  2.2  NuStep   Level  2    SPM  60    Minutes  10    METs  1.5      Prescription Details   Frequency (times per week)  3    Duration  Progress to 30 minutes of continuous aerobic without signs/symptoms of physical distress      Intensity   THRR 40-80% of Max Heartrate  60-121    Ratings of Perceived Exertion  11-13    Perceived Dyspnea  0-4      Progression   Progression  Continue to progress workloads to maintain intensity without signs/symptoms of physical distress.      Resistance Training   Training Prescription  Yes    Weight  2lbs    Reps  10-15       Perform Capillary Blood Glucose checks as needed.  Exercise Prescription Changes:   Exercise Comments:   Exercise Goals and Review: Exercise Goals    Row Name 12/19/17 0912             Exercise Goals   Increase Physical Activity  Yes       Intervention  Provide advice, education, support and counseling about physical activity/exercise needs.;Develop an individualized exercise prescription for aerobic and resistive training based on initial evaluation findings, risk stratification, comorbidities and participant's personal goals.       Expected Outcomes  Long Term: Exercising regularly at least 3-5 days a week.;Short Term: Attend rehab on a regular basis to increase amount of physical activity.;Long Term: Add in home exercise to make exercise part of routine and to increase amount of physical activity.       Increase Strength and Stamina  Yes improve walking tolerance/capacity       Intervention  Provide advice, education, support and counseling about physical activity/exercise needs.;Develop an individualized exercise prescription for aerobic and resistive training based on initial evaluation findings, risk stratification, comorbidities and participant's personal goals.       Expected Outcomes  Short  Term: Increase workloads from initial exercise prescription for resistance, speed, and METs.;Short Term: Perform resistance training exercises routinely during rehab and add in resistance training at home;Long Term: Improve cardiorespiratory fitness, muscular endurance and strength as measured by increased METs and functional capacity (6MWT)       Able to understand and use rate of perceived exertion (RPE) scale  Yes       Intervention  Provide education and explanation on how to use RPE scale       Expected Outcomes  Short Term: Able to use RPE daily in rehab to express subjective intensity level;Long Term:  Able to use RPE to guide intensity level when exercising independently       Knowledge and understanding of Target Heart Rate Range (THRR)  Yes       Intervention  Provide education and explanation of THRR including how the numbers were predicted and where they are located for reference       Expected Outcomes  Short Term: Able to state/look up THRR;Long Term: Able to use THRR to govern intensity when exercising independently;Short Term: Able to use daily as guideline for intensity in rehab       Able to check pulse independently  Yes       Intervention  Provide education and demonstration on how to check pulse in carotid and radial arteries.;Review the importance of being able to check your own pulse for safety during independent exercise  Expected Outcomes  Short Term: Able to explain why pulse checking is important during independent exercise;Long Term: Able to check pulse independently and accurately       Understanding of Exercise Prescription  Yes       Intervention  Provide education, explanation, and written materials on patient's individual exercise prescription       Expected Outcomes  Short Term: Able to explain program exercise prescription;Long Term: Able to explain home exercise prescription to exercise independently          Exercise Goals Re-Evaluation :    Discharge  Exercise Prescription (Final Exercise Prescription Changes):   Nutrition:  Target Goals: Understanding of nutrition guidelines, daily intake of sodium 1500mg , cholesterol 200mg , calories 30% from fat and 7% or less from saturated fats, daily to have 5 or more servings of fruits and vegetables.  Biometrics: Pre Biometrics - 12/19/17 1037      Pre Biometrics   Height  5' 6.25" (1.683 m)    Weight  208 lb 1.8 oz (94.4 kg)    Waist Circumference  44 inches    Hip Circumference  48.75 inches    Waist to Hip Ratio  0.9 %    BMI (Calculated)  33.33    Triceps Skinfold  55 mm    % Body Fat  48.6 %    Grip Strength  22 kg    Flexibility  11 in    Single Leg Stand  2 seconds        Nutrition Therapy Plan and Nutrition Goals: Nutrition Therapy & Goals - 12/19/17 1022      Nutrition Therapy   Diet  Heart Healthy      Personal Nutrition Goals   Nutrition Goal  Pt to identify food quantities necessary to achieve weight loss of 6-24 lb (2.7-10.9 kg) at graduation from cardiac rehab. Goal wt of 150 lb desired.       Intervention Plan   Intervention  Prescribe, educate and counsel regarding individualized specific dietary modifications aiming towards targeted core components such as weight, hypertension, lipid management, diabetes, heart failure and other comorbidities.    Expected Outcomes  Short Term Goal: Understand basic principles of dietary content, such as calories, fat, sodium, cholesterol and nutrients.;Long Term Goal: Adherence to prescribed nutrition plan.       Nutrition Assessments: Nutrition Assessments - 12/19/17 1021      MEDFICTS Scores   Pre Score  24       Nutrition Goals Re-Evaluation:   Nutrition Goals Re-Evaluation:   Nutrition Goals Discharge (Final Nutrition Goals Re-Evaluation):   Psychosocial: Target Goals: Acknowledge presence or absence of significant depression and/or stress, maximize coping skills, provide positive support system. Participant  is able to verbalize types and ability to use techniques and skills needed for reducing stress and depression.  Initial Review & Psychosocial Screening: Initial Psych Review & Screening - 12/19/17 [place]?  Yes      Barriers   Psychosocial barriers to participate in program  There are no identifiable barriers or psychosocial needs.      Screening Interventions   Interventions  Encouraged to exercise    Expected Outcomes  Short Term goal: Utilizing psychosocial counselor, staff and physician to assist with identification of specific Stressors or current issues interfering with healing process. Setting desired goal for each stressor or current issue identified.;Long Term Goal: Stressors or current issues are controlled or eliminated.;Short Term goal: Identification and  review with participant of any Quality of Life or Depression concerns found by scoring the questionnaire.;Long Term goal: The participant improves quality of Life and PHQ9 Scores as seen by post scores and/or verbalization of changes       Quality of Life Scores: Quality of Life - 12/19/17 1044      Quality of Life Scores   Health/Function Pre  18.97 %    Socioeconomic Pre  22.5 %    Psych/Spiritual Pre  20.21 %    Family Pre  22.8 %    GLOBAL Pre  20.62 %      Scores of 19 and below usually indicate a poorer quality of life in these areas.  A difference of  2-3 points is a clinically meaningful difference.  A difference of 2-3 points in the total score of the Quality of Life Index has been associated with significant improvement in overall quality of life, self-image, physical symptoms, and general health in studies assessing change in quality of life.  PHQ-9: Recent Review Flowsheet Data    Depression screen [facility] 2/9 10/18/2016 10/05/2016 08/05/2015 03/23/2015 01/14/2014   Decreased Interest 0 0 0 0 0   Down, Depressed, Hopeless 0 0 0 0 0   PHQ - 2 Score 0 0 0 0 0      Interpretation of Total Score  Total Score Depression Severity:  1-4 = Minimal depression, 5-9 = Mild depression, 10-14 = Moderate depression, 15-19 = Moderately severe depression, 20-27 = Severe depression   Psychosocial Evaluation and Intervention:   Psychosocial Re-Evaluation:   Psychosocial Discharge (Final Psychosocial Re-Evaluation):   Vocational Rehabilitation: Provide vocational rehab assistance to qualifying candidates.   Vocational Rehab Evaluation & Intervention: Vocational Rehab - 12/19/17 1124      Initial Vocational Rehab Evaluation & Intervention   Assessment shows need for Vocational Rehabilitation  No Laquandra is retired and does not need vocational rehab at this time       Education: Education Goals: Education classes will be provided on a weekly basis, covering required topics. Participant will state understanding/return demonstration of topics presented.  Learning Barriers/Preferences: Learning Barriers/Preferences - 12/19/17 0910      Learning Barriers/Preferences   Learning Barriers  Sight    Learning Preferences  Video;Pictoral       Education Topics: Count Your Pulse:  -Group instruction provided by verbal instruction, demonstration, patient participation and written materials to support subject.  Instructors address importance of being able to find your pulse and how to count your pulse when at home without a heart monitor.  Patients get hands on experience counting their pulse with staff help and individually.   Heart Attack, Angina, and Risk Factor Modification:  -Group instruction provided by verbal instruction, video, and written materials to support subject.  Instructors address signs and symptoms of angina and heart attacks.    Also discuss risk factors for heart disease and how to make changes to improve heart health risk factors.   Functional Fitness:  -Group instruction provided by verbal instruction, demonstration, patient  participation, and written materials to support subject.  Instructors address safety measures for doing things around the house.  Discuss how to get up and down off the floor, how to pick things up properly, how to safely get out of a chair without assistance, and balance training.   Meditation and Mindfulness:  -Group instruction provided by verbal instruction, patient participation, and written materials to support subject.  Instructor addresses importance of mindfulness and meditation practice to help  reduce stress and improve awareness.  Instructor also leads participants through a meditation exercise.    Stretching for Flexibility and Mobility:  -Group instruction provided by verbal instruction, patient participation, and written materials to support subject.  Instructors lead participants through series of stretches that are designed to increase flexibility thus improving mobility.  These stretches are additional exercise for major muscle groups that are typically performed during regular warm up and cool down.   Hands Only CPR:  -Group verbal, video, and participation provides a basic overview of AHA guidelines for community CPR. Role-play of emergencies allow participants the opportunity to practice calling for help and chest compression technique with discussion of AED use.   Hypertension: -Group verbal and written instruction that provides a basic overview of hypertension including the most recent diagnostic guidelines, risk factor reduction with self-care instructions and medication management.    Nutrition I class: Heart Healthy Eating:  -Group instruction provided by PowerPoint slides, verbal discussion, and written materials to support subject matter. The instructor gives an explanation and review of the Therapeutic Lifestyle Changes diet recommendations, which includes a discussion on lipid goals, dietary fat, sodium, fiber, plant stanol/sterol esters, sugar, and the components of  a well-balanced, healthy diet.   Nutrition II class: Lifestyle Skills:  -Group instruction provided by PowerPoint slides, verbal discussion, and written materials to support subject matter. The instructor gives an explanation and review of label reading, grocery shopping for heart health, heart healthy recipe modifications, and ways to make healthier choices when eating out.   Diabetes Question & Answer:  -Group instruction provided by PowerPoint slides, verbal discussion, and written materials to support subject matter. The instructor gives an explanation and review of diabetes co-morbidities, pre- and post-prandial blood glucose goals, pre-exercise blood glucose goals, signs, symptoms, and treatment of hypoglycemia and hyperglycemia, and foot care basics.   Diabetes Blitz:  -Group instruction provided by PowerPoint slides, verbal discussion, and written materials to support subject matter. The instructor gives an explanation and review of the physiology behind type 1 and type 2 diabetes, diabetes medications and rational behind using different medications, pre- and post-prandial blood glucose recommendations and Hemoglobin A1c goals, diabetes diet, and exercise including blood glucose guidelines for exercising safely.    Portion Distortion:  -Group instruction provided by PowerPoint slides, verbal discussion, written materials, and food models to support subject matter. The instructor gives an explanation of serving size versus portion size, changes in portions sizes over the last 20 years, and what consists of a serving from each food group.   Stress Management:  -Group instruction provided by verbal instruction, video, and written materials to support subject matter.  Instructors review role of stress in heart disease and how to cope with stress positively.     Exercising on Your Own:  -Group instruction provided by verbal instruction, power point, and written materials to support  subject.  Instructors discuss benefits of exercise, components of exercise, frequency and intensity of exercise, and end points for exercise.  Also discuss use of nitroglycerin and activating EMS.  Review options of places to exercise outside of rehab.  Review guidelines for sex with heart disease.   Cardiac Drugs I:  -Group instruction provided by verbal instruction and written materials to support subject.  Instructor reviews cardiac drug classes: antiplatelets, anticoagulants, beta blockers, and statins.  Instructor discusses reasons, side effects, and lifestyle considerations for each drug class.   Cardiac Drugs II:  -Group instruction provided by verbal instruction and written materials to support subject.  Instructor reviews cardiac drug classes: angiotensin converting enzyme inhibitors (ACE-I), angiotensin II receptor blockers (ARBs), nitrates, and calcium channel blockers.  Instructor discusses reasons, side effects, and lifestyle considerations for each drug class.   Anatomy and Physiology of the Circulatory System:  Group verbal and written instruction and models provide basic cardiac anatomy and physiology, with the coronary electrical and arterial systems. Review of: AMI, Angina, Valve disease, Heart Failure, Peripheral Artery Disease, Cardiac Arrhythmia, Pacemakers, and the ICD.   Other Education:  -Group or individual verbal, written, or video instructions that support the educational goals of the cardiac rehab program.   Holiday Eating Survival Tips:  -Group instruction provided by PowerPoint slides, verbal discussion, and written materials to support subject matter. The instructor gives patients tips, tricks, and techniques to help them not only survive but enjoy the holidays despite the onslaught of food that accompanies the holidays.   Knowledge Questionnaire Score: Knowledge Questionnaire Score - 12/19/17 1031      Knowledge Questionnaire Score   Pre Score  22/24        Core Components/Risk Factors/Patient Goals at Admission: Personal Goals and Risk Factors at Admission - 12/19/17 1040      Core Components/Risk Factors/Patient Goals on Admission    Weight Management  Yes;Obesity;Weight Maintenance;Weight Loss    Intervention  Weight Management: Develop a combined nutrition and exercise program designed to reach desired caloric intake, while maintaining appropriate intake of nutrient and fiber, sodium and fats, and appropriate energy expenditure required for the weight goal.;Weight Management: Provide education and appropriate resources to help participant work on and attain dietary goals.;Weight Management/Obesity: Establish reasonable short term and long term weight goals.;Obesity: Provide education and appropriate resources to help participant work on and attain dietary goals.    Admit Weight  208 lb 1.8 oz (94.4 kg)    Goal Weight: Short Term  200 lb (90.7 kg)    Goal Weight: Long Term  190 lb (86.2 kg)    Expected Outcomes  Short Term: Continue to assess and modify interventions until short term weight is achieved;Long Term: Adherence to nutrition and physical activity/exercise program aimed toward attainment of established weight goal;Weight Maintenance: Understanding of the daily nutrition guidelines, which includes 25-35% calories from fat, 7% or less cal from saturated fats, less than 200mg  cholesterol, less than 1.5gm of sodium, & 5 or more servings of fruits and vegetables daily;Weight Loss: Understanding of general recommendations for a balanced deficit meal plan, which promotes 1-2 lb weight loss per week and includes a negative energy balance of [phone] kcal/d;Understanding recommendations for meals to include 15-35% energy as protein, 25-35% energy from fat, 35-60% energy from carbohydrates, less than 200mg  of dietary cholesterol, 20-35 gm of total fiber daily;Understanding of distribution of calorie intake throughout the day with the consumption of  4-5 meals/snacks    Improve shortness of breath with ADL's  Yes    Intervention  Provide education, individualized exercise plan and daily activity instruction to help decrease symptoms of SOB with activities of daily living.    Expected Outcomes  Short Term: Improve cardiorespiratory fitness to achieve a reduction of symptoms when performing ADLs;Long Term: Be able to perform more ADLs without symptoms or delay the onset of symptoms    Heart Failure  Yes    Intervention  Provide a combined exercise and nutrition program that is supplemented with education, support and counseling about heart failure. Directed toward relieving symptoms such as shortness of breath, decreased exercise tolerance, and extremity edema.  Expected Outcomes  Improve functional capacity of life;Long term: Adoption of self-care skills and reduction of barriers for early signs and symptoms recognition and intervention leading to self-care maintenance.;Short term: Daily weights obtained and reported for increase. Utilizing diuretic protocols set by physician.;Short term: Attendance in program 2-3 days a week with increased exercise capacity. Reported lower sodium intake. Reported increased fruit and vegetable intake. Reports medication compliance.    Hypertension  Yes    Intervention  Provide education on lifestyle modifcations including regular physical activity/exercise, weight management, moderate sodium restriction and increased consumption of fresh fruit, vegetables, and low fat dairy, alcohol moderation, and smoking cessation.;Monitor prescription use compliance.    Expected Outcomes  Short Term: Continued assessment and intervention until BP is < 140/35mm HG in hypertensive participants. < 130/67mm HG in hypertensive participants with diabetes, heart failure or chronic kidney disease.;Long Term: Maintenance of blood pressure at goal levels.    Lipids  Yes    Intervention  Provide education and support for participant on  nutrition & aerobic/resistive exercise along with prescribed medications to achieve LDL 70mg , HDL >40mg .    Expected Outcomes  Short Term: Participant states understanding of desired cholesterol values and is compliant with medications prescribed. Participant is following exercise prescription and nutrition guidelines.;Long Term: Cholesterol controlled with medications as prescribed, with individualized exercise RX and with personalized nutrition plan. Value goals: LDL < 70mg , HDL > 40 mg.    Stress  Yes    Intervention  Offer individual and/or small group education and counseling on adjustment to heart disease, stress management and health-related lifestyle change. Teach and support self-help strategies.;Refer participants experiencing significant psychosocial distress to appropriate mental health specialists for further evaluation and treatment. When possible, include family members and significant others in education/counseling sessions.    Expected Outcomes  Short Term: Participant demonstrates changes in health-related behavior, relaxation and other stress management skills, ability to obtain effective social support, and compliance with psychotropic medications if prescribed.;Long Term: Emotional wellbeing is indicated by absence of clinically significant psychosocial distress or social isolation.       Core Components/Risk Factors/Patient Goals Review:    Core Components/Risk Factors/Patient Goals at Discharge (Final Review):    ITP Comments: ITP Comments    Row Name 12/19/17 0909           ITP Comments  Medical Director, Dr. Fransico Him, MD          Comments: Shevon attended orientation from [phone] to 1008 to review rules and guidelines for program. Completed 6 minute walk test, Intitial ITP, and exercise prescription.  VSS. Telemetry-Sinus Rhythm with an occasional PVC .  Asymptomatic. Kayleigh did use a wheelchair for stability. Velita did report experiencing some mild  shortness of breath towards the end of her walk test. This resolved after rest.Lynisha Osuch Venetia Maxon, RN,BSN 12/19/2017 11:44 AM

## 2017-12-25 ENCOUNTER — Encounter (HOSPITAL_COMMUNITY)
Admission: RE | Admit: 2017-12-25 | Discharge: 2017-12-25 | Disposition: A | Discharge status: Home / Self Care | Payer: Medicare Other | Source: Ambulatory Visit | Attending: Cardiovascular Disease | Admitting: Cardiovascular Disease

## 2017-12-25 ENCOUNTER — Encounter (HOSPITAL_COMMUNITY): Payer: Self-pay

## 2017-12-25 DIAGNOSIS — Z79899 Other long term (current) drug therapy: Secondary | ICD-10-CM | POA: Diagnosis not present

## 2017-12-25 DIAGNOSIS — E785 Hyperlipidemia, unspecified: Secondary | ICD-10-CM | POA: Diagnosis not present

## 2017-12-25 DIAGNOSIS — I509 Heart failure, unspecified: Secondary | ICD-10-CM | POA: Diagnosis not present

## 2017-12-25 DIAGNOSIS — Z955 Presence of coronary angioplasty implant and graft: Secondary | ICD-10-CM | POA: Diagnosis not present

## 2017-12-25 DIAGNOSIS — I110 Hypertensive heart disease with heart failure: Secondary | ICD-10-CM | POA: Diagnosis not present

## 2017-12-25 DIAGNOSIS — Z87891 Personal history of nicotine dependence: Secondary | ICD-10-CM | POA: Diagnosis not present

## 2017-12-25 NOTE — Progress Notes (Signed)
Daily Session Note  Patient Details  Name: Tracy [name] MRN: [id number] Date of Birth: 1946/02/28 Referring Provider:     CARDIAC REHAB PHASE II ORIENTATION from 12/19/2017 in [place]  Referring Provider  Sherren Mocha MD      Encounter Date: 12/25/2017  Check In: Session Check In - 12/25/17 0849      Check-In   Location  [facility]    Staff Present  Dorna Bloom, MS, ACSM RCEP, Exercise Physiologist;Maria Whitaker, RN, BSN;Other;Tyara Nevels, MS,ACSM CEP, Exercise Physiologist    Supervising physician immediately available to respond to emergencies  [facility] immediately available    Physician(s)  Dr. Horris Latino    Medication changes reported      No    Fall or balance concerns reported     No    Tobacco Cessation  No Change    Warm-up and Cool-down  Performed as group-led instruction    Resistance Training Performed  Yes    VAD Patient?  No      Pain Assessment   Currently in Pain?  No/denies    Multiple Pain Sites  No       Capillary Blood Glucose: No results found for this or any previous visit (from the past 24 hour(s)).    Social History   Tobacco Use  Smoking Status Former Smoker  . Last attempt to quit: 07/01/2012  . Years since quitting: 5.4  Smokeless Tobacco Never Used    Goals Met:  Exercise tolerated well  Goals Unmet:  Not Applicable  Comments: Pt started cardiac rehab today.  Pt tolerated light exercise without difficulty. VSS, telemetry-sinus rhythm,  asymptomatic.  Medication list reconciled. Pt denies barriers to medicaiton compliance.  PSYCHOSOCIAL ASSESSMENT:  PHQ-6.  Pt verbalizes situational stress from her sisters cancer in addition to her recent cardiac event.    Pt enjoys speding time with her husband.  Pt oriented to exercise equipment and routine.    Understanding verbalized.   Dr. Fransico Him is Medical Director for Cardiac Rehab at [facility].

## 2017-12-27 ENCOUNTER — Encounter (HOSPITAL_COMMUNITY)
Admission: RE | Admit: 2017-12-27 | Discharge: 2017-12-27 | Disposition: A | Discharge status: Home / Self Care | Payer: Medicare Other | Source: Ambulatory Visit | Attending: Cardiovascular Disease | Admitting: Cardiovascular Disease

## 2017-12-27 DIAGNOSIS — Z955 Presence of coronary angioplasty implant and graft: Secondary | ICD-10-CM | POA: Diagnosis not present

## 2017-12-27 DIAGNOSIS — Z79899 Other long term (current) drug therapy: Secondary | ICD-10-CM | POA: Diagnosis not present

## 2017-12-27 DIAGNOSIS — I509 Heart failure, unspecified: Secondary | ICD-10-CM | POA: Diagnosis not present

## 2017-12-27 DIAGNOSIS — E785 Hyperlipidemia, unspecified: Secondary | ICD-10-CM | POA: Diagnosis not present

## 2017-12-27 DIAGNOSIS — Z87891 Personal history of nicotine dependence: Secondary | ICD-10-CM | POA: Diagnosis not present

## 2017-12-27 DIAGNOSIS — I110 Hypertensive heart disease with heart failure: Secondary | ICD-10-CM | POA: Diagnosis not present

## 2017-12-29 ENCOUNTER — Encounter (HOSPITAL_COMMUNITY)
Admission: RE | Admit: 2017-12-29 | Discharge: 2017-12-29 | Disposition: A | Discharge status: Home / Self Care | Payer: Medicare Other | Source: Ambulatory Visit | Attending: Cardiovascular Disease | Admitting: Cardiovascular Disease

## 2017-12-29 DIAGNOSIS — E785 Hyperlipidemia, unspecified: Secondary | ICD-10-CM | POA: Diagnosis not present

## 2017-12-29 DIAGNOSIS — I110 Hypertensive heart disease with heart failure: Secondary | ICD-10-CM | POA: Diagnosis not present

## 2017-12-29 DIAGNOSIS — I509 Heart failure, unspecified: Secondary | ICD-10-CM | POA: Diagnosis not present

## 2017-12-29 DIAGNOSIS — Z87891 Personal history of nicotine dependence: Secondary | ICD-10-CM | POA: Diagnosis not present

## 2017-12-29 DIAGNOSIS — Z955 Presence of coronary angioplasty implant and graft: Secondary | ICD-10-CM | POA: Diagnosis not present

## 2017-12-29 DIAGNOSIS — Z79899 Other long term (current) drug therapy: Secondary | ICD-10-CM | POA: Diagnosis not present

## 2018-01-01 ENCOUNTER — Encounter (HOSPITAL_COMMUNITY)
Admission: RE | Admit: 2018-01-01 | Discharge: 2018-01-01 | Disposition: A | Discharge status: Home / Self Care | Payer: Medicare Other | Source: Ambulatory Visit | Attending: Cardiovascular Disease | Admitting: Cardiovascular Disease

## 2018-01-01 DIAGNOSIS — Z955 Presence of coronary angioplasty implant and graft: Secondary | ICD-10-CM | POA: Diagnosis not present

## 2018-01-01 DIAGNOSIS — I509 Heart failure, unspecified: Secondary | ICD-10-CM | POA: Diagnosis not present

## 2018-01-01 DIAGNOSIS — Z87891 Personal history of nicotine dependence: Secondary | ICD-10-CM | POA: Diagnosis not present

## 2018-01-01 DIAGNOSIS — E785 Hyperlipidemia, unspecified: Secondary | ICD-10-CM | POA: Diagnosis not present

## 2018-01-01 DIAGNOSIS — I110 Hypertensive heart disease with heart failure: Secondary | ICD-10-CM | POA: Diagnosis not present

## 2018-01-01 DIAGNOSIS — Z79899 Other long term (current) drug therapy: Secondary | ICD-10-CM | POA: Diagnosis not present

## 2018-01-03 ENCOUNTER — Encounter (HOSPITAL_COMMUNITY)
Admission: RE | Admit: 2018-01-03 | Discharge: 2018-01-03 | Disposition: A | Discharge status: Home / Self Care | Payer: Medicare Other | Source: Ambulatory Visit | Attending: Cardiovascular Disease | Admitting: Cardiovascular Disease

## 2018-01-03 DIAGNOSIS — Z79899 Other long term (current) drug therapy: Secondary | ICD-10-CM | POA: Diagnosis not present

## 2018-01-03 DIAGNOSIS — I110 Hypertensive heart disease with heart failure: Secondary | ICD-10-CM | POA: Diagnosis not present

## 2018-01-03 DIAGNOSIS — I509 Heart failure, unspecified: Secondary | ICD-10-CM | POA: Diagnosis not present

## 2018-01-03 DIAGNOSIS — Z955 Presence of coronary angioplasty implant and graft: Secondary | ICD-10-CM

## 2018-01-03 DIAGNOSIS — Z87891 Personal history of nicotine dependence: Secondary | ICD-10-CM | POA: Diagnosis not present

## 2018-01-03 DIAGNOSIS — E785 Hyperlipidemia, unspecified: Secondary | ICD-10-CM | POA: Diagnosis not present

## 2018-01-03 NOTE — Progress Notes (Signed)
FRANKIE ZITO 72 y.o. female DOB: 06-28-1946 MRN: [id number]      Nutrition Note  Dx: s/p PCI/DES  Nutrition Note Spoke with pt. Nutrition plan and survey reviewed with pt. Pt is following a heart healthy diet. Pt reports she eats out "at least 3 times a week but not every day." Pt c/o wt gain overnight. Pt reports she ate at [employer]. Sodium content of foods when eating out discussed. Pt expressed understanding of the information reviewed. Pt aware of nutrition education classes offered and plans on attending nutrition classes.  Nutrition Diagnosis ? Food-and nutrition-related knowledge deficit related to lack of exposure to information as related to diagnosis of: ? CVD  ? Obesity related to excessive energy intake as evidenced by a BMI of 34.3  Nutrition Intervention ? Pt's individual nutrition plan reviewed with pt. ? Benefits of adopting Heart Healthy diet discussed when [employer] reviewed.   ? Pt given handouts for: ? Nutrition II class   Nutrition Goal(s):  ? Pt to identify food quantities necessary to achieve weight loss of 6-24 lb (2.7-10.9 kg) at graduation from cardiac rehab.   Plan:  Pt to attend nutrition classes ? Nutrition I ? Nutrition II ? Portion Distortion  Will provide client-centered nutrition education as part of interdisciplinary care.   Monitor and evaluate progress toward nutrition goal with team.  Derek Mound, M.Ed, RD, LDN, CDE 01/03/2018 9:17 AM

## 2018-01-04 ENCOUNTER — Ambulatory Visit
Admission: RE | Admit: 2018-01-04 | Discharge: 2018-01-04 | Disposition: A | Discharge status: Home / Self Care | Payer: Medicare Other | Source: Ambulatory Visit | Attending: Cardiovascular Disease | Admitting: Cardiovascular Disease

## 2018-01-04 ENCOUNTER — Encounter: Payer: Self-pay | Admitting: Cardiovascular Disease

## 2018-01-04 ENCOUNTER — Ambulatory Visit (INDEPENDENT_AMBULATORY_CARE_PROVIDER_SITE_OTHER): Payer: Medicare Other | Admitting: Cardiovascular Disease

## 2018-01-04 ENCOUNTER — Encounter (HOSPITAL_COMMUNITY): Payer: Self-pay

## 2018-01-04 VITALS — BP 116/72 | HR 87 | Ht 65.0 in | Wt 207.0 lb

## 2018-01-04 DIAGNOSIS — I2510 Atherosclerotic heart disease of native coronary artery without angina pectoris: Secondary | ICD-10-CM | POA: Diagnosis not present

## 2018-01-04 DIAGNOSIS — R0602 Shortness of breath: Secondary | ICD-10-CM

## 2018-01-04 MED ORDER — FUROSEMIDE 40 MG PO TABS: 40.0000 mg | ORAL_TABLET | Freq: Every day | ORAL | 3 refills | Status: DC

## 2018-01-04 MED ORDER — POTASSIUM CHLORIDE ER 10 MEQ PO TBCR: 10.0000 meq | EXTENDED_RELEASE_TABLET | Freq: Every day | ORAL | 3 refills | Status: DC

## 2018-01-04 NOTE — Progress Notes (Signed)
Cardiac Individual Treatment Plan  Patient Details  Name: Tracy [name] MRN: [id number] Date of Birth: [birth year] Referring Provider:   Flowsheet Row CARDIAC REHAB PHASE II ORIENTATION from 12/19/2017 in [place]  Referring Provider  Sherren Mocha MD      Initial Encounter Date:  [place] PHASE II ORIENTATION from 12/19/2017 in [place]  Date  12/19/17  Referring Provider  Sherren Mocha MD      Visit Diagnosis: Status post coronary artery stent placement 11/06/17  S/P DES CFX   Patient's Home Medications on Admission:  Current Outpatient Medications:  .  allopurinol (ZYLOPRIM) 100 MG tablet, TAKE 1 TABLET BY MOUTH EVERY DAY, Disp: 90 tablet, Rfl: 0 .  ALPRAZolam (XANAX) 0.25 MG tablet, TAKE 1 TABLET BY MOUTH TWICE A DAY AS NEEDED FOR ANXIETY (Patient not taking: Reported on 12/18/2017), Disp: 60 tablet, Rfl: 5 .  Ascorbic Acid (VITAMIN C) 1000 MG tablet, Take 1,000 mg by mouth See admin instructions. Once every 2 weeks, Disp: , Rfl:  .  aspirin EC 81 MG tablet, Take 81 mg by mouth daily., Disp: , Rfl:  .  atorvastatin (LIPITOR) 40 MG tablet, TAKE 1 TABLET DAILY BY MOUTH. PLEASE KEEP UPCOMING APPT FOR FUTURE REFILLS. THANKS, Disp: 90 tablet, Rfl: 3 .  calcium carbonate (CALCIUM 600) 600 MG TABS tablet, Take 600 mg by mouth See admin instructions. Once every 2 weeks, Disp: , Rfl:  .  cetirizine (ZYRTEC) 10 MG tablet, Take 10 mg by mouth daily., Disp: , Rfl:  .  Cholecalciferol (VITAMIN D3 PO), Take 1,000 Units by mouth daily., Disp: , Rfl:  .  clopidogrel (PLAVIX) 75 MG tablet, Take 1 tablet (75 mg total) by mouth daily with breakfast., Disp: 90 tablet, Rfl: 2 .  Coenzyme Q10 (CO Q 10 PO), Take 300 mg by mouth See admin instructions. Once every 2 weeks., Disp: , Rfl:  .  Cyanocobalamin (VITAMIN B-12) 5000 MCG SUBL, Place 5,000 mcg under the tongue daily., Disp: , Rfl:  .  furosemide (LASIX) 20 MG  tablet, Take 1 tablet (20 mg total) by mouth daily., Disp: 90 tablet, Rfl: 3 .  HYDROcodone-acetaminophen (NORCO/VICODIN) 5-325 MG tablet, TAKE 1 TABLET EVERY 6 HOURS AS NEEDED FOR PAIN, Disp: 60 tablet, Rfl: 0 .  losartan (COZAAR) 100 MG tablet, TAKE 1 TABLET (100 MG TOTAL) BY MOUTH DAILY., Disp: 90 tablet, Rfl: 3 .  metoprolol tartrate (LOPRESSOR) 100 MG tablet, TAKE 1 TABLET (100 MG TOTAL) BY MOUTH 2 (TWO) TIMES DAILY., Disp: 180 tablet, Rfl: 1 .  Misc Natural Products (TART CHERRY ADVANCED) CAPS, Take 1 capsule by mouth daily., Disp: , Rfl:  .  nitroGLYCERIN (NITROSTAT) 0.4 MG SL tablet, Place 1 tablet (0.4 mg total) under the tongue every 5 (five) minutes as needed for chest pain., Disp: 25 tablet, Rfl: 3 .  Omega-3 Fatty Acids (FISH OIL OMEGA-3 PO), Take 1,400 mg by mouth See admin instructions. Once every couple weeks, Disp: , Rfl:  .  pantoprazole (PROTONIX) 40 MG tablet, TAKE 1 TABLET (40 MG TOTAL) BY MOUTH DAILY., Disp: 90 tablet, Rfl: 1 .  vitamin E 200 UNIT capsule, Take 200 Units by mouth daily., Disp: , Rfl:   Past Medical History: Past Medical History:  Diagnosis Date  . Allergic rhinitis   . CAD (coronary artery disease)    1/19 PCI/DES to [place] for ISR, normal EF.   Mar[place] Kitchen CHF (congestive heart failure) ([place])   . Gout   .  Headache(784.0)   . Hyperlipidemia   . Hypertension   . Low back pain   . Menopausal syndrome   . Myocardial infarct ([place]) 2007   hx of  . Overactive bladder     Tobacco Use: Social History   Tobacco Use  Smoking Status Former Smoker  . Last attempt to quit: 07/01/2012  . Years since quitting: 5.5  Smokeless Tobacco Never Used    Labs: Recent Review Flowsheet Data    Labs for ITP Cardiac and Pulmonary Rehab Latest Ref Rng & Units 01/05/2015 06/19/2015 10/05/2015 04/05/2016 09/13/2016   Cholestrol <200 mg/dL 180 193 164 190 154   LDLCALC <100 mg/dL - 121(H) 92 119 88   LDLDIRECT mg/dL 111.0 - - - -   HDL >50 mg/dL 32.20(L) 34.00(L) 34(L) 41(L) 31(L)    Trlycerides <150 mg/dL 294.0(H) 189.0(H) 188(H) 150(H) 175(H)      Capillary Blood Glucose: No results found for: GLUCAP   Exercise Target Goals:    Exercise Program Goal: Individual exercise prescription set using results from initial 6 min walk test and THRR while considering  patient's activity barriers and safety.   Exercise Prescription Goal: Initial exercise prescription builds to 30-45 minutes a day of aerobic activity, 2-3 days per week.  Home exercise guidelines will be given to patient during program as part of exercise prescription that the participant will acknowledge.  Activity Barriers & Risk Stratification: Activity Barriers & Cardiac Risk Stratification - 12/19/17 0914    Activity Barriers & Cardiac Risk Stratification          Activity Barriers  Arthritis;Joint Problems;Deconditioning;Muscular Weakness;Back Problems;Other (comment);Balance Concerns;Shortness of Breath           6 Minute Walk: 6 Minute Walk    6 Minute Walk    Row Name 12/19/17 1031 12/19/17 1056   Distance  838 feet  no documentation   Walk Time  6 minutes  no documentation   # of Rest Breaks  0  no documentation   MPH  1.5  no documentation   METS  2.1  no documentation   RPE  13  no documentation   VO2 Peak  7.4  no documentation   Symptoms  No  no documentation   Resting HR  94 bpm  no documentation   Resting BP  142/82  no documentation   Resting Oxygen Saturation   96 %  no documentation   Exercise Oxygen Saturation  during 6 min walk  95 %  no documentation   Max Ex. HR  109 bpm  no documentation   Max Ex. BP  160/82  no documentation   2 Minute Post BP  104/70  120/78          Oxygen Initial Assessment:   Oxygen Re-Evaluation:   Oxygen Discharge (Final Oxygen Re-Evaluation):   Initial Exercise Prescription: Initial Exercise Prescription - 12/19/17 1000    Date of Initial Exercise RX and Referring Provider          Date  12/19/17    Referring Provider  Sherren Mocha MD        Treadmill          MPH  1.5    Grade  0    Minutes  10    METs  2.15        Recumbant Bike          Level  1.5    Watts  5    Minutes  10  METs  2.2        NuStep          Level  2    SPM  60    Minutes  10    METs  1.5        Prescription Details          Frequency (times per week)  3    Duration  Progress to 30 minutes of continuous aerobic without signs/symptoms of physical distress        Intensity          THRR 40-80% of Max Heartrate  60-121    Ratings of Perceived Exertion  11-13    Perceived Dyspnea  0-4        Progression          Progression  Continue to progress workloads to maintain intensity without signs/symptoms of physical distress.        Resistance Training          Training Prescription  Yes    Weight  2lbs    Reps  10-15           Perform Capillary Blood Glucose checks as needed.  Exercise Prescription Changes: Exercise Prescription Changes    Response to Exercise    Row Name 12/25/17 1541 01/01/18 1600   Blood Pressure (Admit)  140/78  108/64   Blood Pressure (Exercise)  152/78  142/62   Blood Pressure (Exit)  130/80  98/60   Heart Rate (Admit)  91 bpm  99 bpm   Heart Rate (Exercise)  113 bpm  111 bpm   Heart Rate (Exit)  87 bpm  94 bpm   Rating of Perceived Exertion (Exercise)  14  13   Symptoms  none  none   Comments  pt oriented to exercise equipment today  pt oriented to exercise equipment today   Duration  Progress to 30 minutes of  aerobic without signs/symptoms of physical distress  Progress to 30 minutes of  aerobic without signs/symptoms of physical distress   Intensity  THRR unchanged  THRR unchanged       Progression    Row Name 12/25/17 1541 01/01/18 1600   Progression  Continue to progress workloads to maintain intensity without signs/symptoms of physical distress.  Continue to progress workloads to maintain intensity without signs/symptoms of physical distress.   Average METs  2.2  1.9        Resistance Training    Row Name 12/25/17 1541 01/01/18 1600   Training Prescription  Yes  Yes   Weight  2lbs  2lbs   Reps  10-15  10-15   Time  10 Minutes  [place] Name 12/25/17 1541 01/01/18 1600   MPH  1.3  1.3   Grade  0  0   Minutes  5  10   METs  1.99  1.99       Recumbant [place] Name 12/25/17 1541 01/01/18 1600   Level  1.5  1.5   Watts  15  15   Minutes  10  10   METs  2.5  1.6       NuStep    Row Name 12/25/17 1541 01/01/18 1600   Level  2  2   SPM  80  80   Minutes  10  10   METs  2.3  2.2  Track    Row Name 12/25/17 1541 01/01/18 1600   Laps  5  no documentation   Minutes  5  no documentation   METs  2  no documentation          Exercise Comments: Exercise Comments    Row Name 12/25/17 1545 01/01/18 1619   Exercise Comments  Pt responded well to first exercise session. Pt was able to exercise for 30 minutes without signs/symptoms of physical distress or pain.  Reviewed goals and activity levels. Pt is progressing well in cardiac rehab and is motivated to exercise for 30 minutes; will continue to monitor and progress WLs as able.       Exercise Goals and Review: Exercise Goals    Exercise Goals    Row Name 12/19/17 0912   Increase Physical Activity  Yes   Intervention  Provide advice, education, support and counseling about physical activity/exercise needs.;Develop an individualized exercise prescription for aerobic and resistive training based on initial evaluation findings, risk stratification, comorbidities and participant's personal goals.   Expected Outcomes  Long Term: Exercising regularly at least 3-5 days a week.;Short Term: Attend rehab on a regular basis to increase amount of physical activity.;Long Term: Add in home exercise to make exercise part of routine and to increase amount of physical activity.   Increase Strength and Stamina  Yes improve walking tolerance/capacity   Intervention  Provide  advice, education, support and counseling about physical activity/exercise needs.;Develop an individualized exercise prescription for aerobic and resistive training based on initial evaluation findings, risk stratification, comorbidities and participant's personal goals.   Expected Outcomes  Short Term: Increase workloads from initial exercise prescription for resistance, speed, and METs.;Short Term: Perform resistance training exercises routinely during rehab and add in resistance training at home;Long Term: Improve cardiorespiratory fitness, muscular endurance and strength as measured by increased METs and functional capacity (6MWT)   Able to understand and use rate of perceived exertion (RPE) scale  Yes   Intervention  Provide education and explanation on how to use RPE scale   Expected Outcomes  Short Term: Able to use RPE daily in rehab to express subjective intensity level;Long Term:  Able to use RPE to guide intensity level when exercising independently   Knowledge and understanding of Target Heart Rate Range (THRR)  Yes   Intervention  Provide education and explanation of THRR including how the numbers were predicted and where they are located for reference   Expected Outcomes  Short Term: Able to state/look up THRR;Long Term: Able to use THRR to govern intensity when exercising independently;Short Term: Able to use daily as guideline for intensity in rehab   Able to check pulse independently  Yes   Intervention  Provide education and demonstration on how to check pulse in carotid and radial arteries.;Review the importance of being able to check your own pulse for safety during independent exercise   Expected Outcomes  Short Term: Able to explain why pulse checking is important during independent exercise;Long Term: Able to check pulse independently and accurately   Understanding of Exercise Prescription  Yes   Intervention  Provide education, explanation, and written materials on patient's  individual exercise prescription   Expected Outcomes  Short Term: Able to explain program exercise prescription;Long Term: Able to explain home exercise prescription to exercise independently          Exercise Goals Re-Evaluation : Exercise Goals Re-Evaluation    Exercise Goal Re-Evaluation    Row Name 01/01/18 1621   Exercise  Goals Review  Increase Physical Activity;Able to understand and use rate of perceived exertion (RPE) scale;Knowledge and understanding of Target Heart Rate Range (THRR);Understanding of Exercise Prescription;Increase Strength and Stamina;Able to check pulse independently   Comments  Pt has increased exercise tolerance on treadmill by walking for a full 10 minutes without stopping. Pt was happy about accomplishment and is eager to continue exercise prescription   Expected Outcomes  Pt will continue to improve in cardiorespiratory and functional fitness           Discharge Exercise Prescription (Final Exercise Prescription Changes): Exercise Prescription Changes - 01/01/18 1600    Response to Exercise          Blood Pressure (Admit)  108/64    Blood Pressure (Exercise)  142/62    Blood Pressure (Exit)  98/60    Heart Rate (Admit)  99 bpm    Heart Rate (Exercise)  111 bpm    Heart Rate (Exit)  94 bpm    Rating of Perceived Exertion (Exercise)  13    Symptoms  none    Comments  pt oriented to exercise equipment today    Duration  Progress to 30 minutes of  aerobic without signs/symptoms of physical distress    Intensity  THRR unchanged        Progression          Progression  Continue to progress workloads to maintain intensity without signs/symptoms of physical distress.    Average METs  1.9        Resistance Training          Training Prescription  Yes    Weight  2lbs    Reps  10-15    Time  10 Minutes        Treadmill          MPH  1.3    Grade  0    Minutes  10    METs  1.99        Recumbant Bike          Level  1.5    Watts  15     Minutes  10    METs  1.6        NuStep          Level  2    SPM  80    Minutes  10    METs  2.2        Track          Laps  --    Minutes  --    METs  --           Nutrition:  Target Goals: Understanding of nutrition guidelines, daily intake of sodium 1500mg , cholesterol 200mg , calories 30% from fat and 7% or less from saturated fats, daily to have 5 or more servings of fruits and vegetables.  Biometrics: Pre Biometrics - 12/19/17 1037    Pre Biometrics          Height  5' 6.25" (1.683 m)    Weight  208 lb 1.8 oz (94.4 kg)    Waist Circumference  44 inches    Hip Circumference  48.75 inches    Waist to Hip Ratio  0.9 %    BMI (Calculated)  33.33    Triceps Skinfold  55 mm    % Body Fat  48.6 %    Grip Strength  22 kg    Flexibility  11 in    Single Leg  Stand  2 seconds            Nutrition Therapy Plan and Nutrition Goals: Nutrition Therapy & Goals - 12/19/17 1022    Nutrition Therapy          Diet  Heart Healthy        Personal Nutrition Goals          Nutrition Goal  Pt to identify food quantities necessary to achieve weight loss of 6-24 lb (2.7-10.9 kg) at graduation from cardiac rehab. Goal wt of 150 lb desired.         Intervention Plan          Intervention  Prescribe, educate and counsel regarding individualized specific dietary modifications aiming towards targeted core components such as weight, hypertension, lipid management, diabetes, heart failure and other comorbidities.    Expected Outcomes  Short Term Goal: Understand basic principles of dietary content, such as calories, fat, sodium, cholesterol and nutrients.;Long Term Goal: Adherence to prescribed nutrition plan.           Nutrition Assessments: Nutrition Assessments - 12/19/17 1021    MEDFICTS Scores          Pre Score  24           Nutrition Goals Re-Evaluation:   Nutrition Goals Re-Evaluation:   Nutrition Goals Discharge (Final Nutrition Goals  Re-Evaluation):   Psychosocial: Target Goals: Acknowledge presence or absence of significant depression and/or stress, maximize coping skills, provide positive support system. Participant is able to verbalize types and ability to use techniques and skills needed for reducing stress and depression.  Initial Review & Psychosocial Screening: Initial Psych Review & Screening - 12/19/17 [place]?  Yes        Barriers          Psychosocial barriers to participate in program  There are no identifiable barriers or psychosocial needs.        Screening Interventions          Interventions  Encouraged to exercise    Expected Outcomes  Short Term goal: Utilizing psychosocial counselor, staff and physician to assist with identification of specific Stressors or current issues interfering with healing process. Setting desired goal for each stressor or current issue identified.;Long Term Goal: Stressors or current issues are controlled or eliminated.;Short Term goal: Identification and review with participant of any Quality of Life or Depression concerns found by scoring the questionnaire.;Long Term goal: The participant improves quality of Life and PHQ9 Scores as seen by post scores and/or verbalization of changes           Quality of Life Scores: Quality of Life - 01/03/18 1445    Quality of Life Scores          Health/Function Pre  18.97 % c/o DOE concerned she may have extra fluid overload.  pt states she is not able to take lasix on exercise days.  pt encouraged to take lasix when she gets home from exercise. pt also c/o fatigue and low energy.  pt encouraged regular exercise will help.     Socioeconomic Pre  22.5 %    Psych/Spiritual Pre  20.21 %    Family Pre  22.8 %    GLOBAL Pre  20.62 % overal QOL scores reflective of recent cardiac event. pt offered counseling with Jeanella Craze. pt will let us know. pt offered emotional support and  [email or web address].e  Scores of 19 and below usually indicate a poorer quality of life in these areas.  A difference of  2-3 points is a clinically meaningful difference.  A difference of 2-3 points in the total score of the Quality of Life Index has been associated with significant improvement in overall quality of life, self-image, physical symptoms, and general health in studies assessing change in quality of life.  PHQ-9: Recent Review Flowsheet Data    Depression screen [facility] 2/9 12/25/2017 10/18/2016 10/05/2016 08/05/2015 03/23/2015   Decreased Interest 1 0 0 0 0   Down, Depressed, Hopeless 1 0 0 0 0   PHQ - 2 Score 2 0 0 0 0   Altered sleeping 1 - - - -   Tired, decreased energy 1 - - - -   Change in appetite 0 - - - -   Feeling bad or failure about yourself  1 - - - -   Trouble concentrating 1 - - - -   Moving slowly or fidgety/restless 0 - - - -   Suicidal thoughts 0 - - - -   PHQ-9 Score 6 - - - -   Difficult doing work/chores Somewhat difficult - - - -     Interpretation of Total Score  Total Score Depression Severity:  1-4 = Minimal depression, 5-9 = Mild depression, 10-14 = Moderate depression, 15-19 = Moderately severe depression, 20-27 = Severe depression   Psychosocial Evaluation and Intervention: Psychosocial Evaluation - 01/01/18 1531    Psychosocial Evaluation & Interventions          Interventions  Encouraged to exercise with the program and follow exercise prescription;Stress management education;Relaxation education    Comments  pt with multiple stressors including sister battling cancer and her recent cardiac event.  pt enjoys spending time with her husband.      Expected Outcomes  pt will exhibit improved outlook with good coping skills.     Continue Psychosocial Services   Follow up required by counselor           Psychosocial Re-Evaluation: Psychosocial Re-Evaluation    Psychosocial Re-Evaluation    [place] Name 01/01/18 1532   Current issues with  Current  Stress Concerns;Current Anxiety/Panic   Comments  pt with health related anxiety and situational stress from sister with cancer. pt encouraged to meet with Jeanella Craze.  pt will let staff know if she is ready to schedule appointment.    Expected Outcomes  pt will exhibit improved outlook with good coping skills.    Interventions  Stress management education;Encouraged to attend Cardiac Rehabilitation for the exercise;Relaxation education   Continue Psychosocial Services   Follow up required by staff   Comments  pt offered Jeanella Craze counseling services.        Initial Review    Row Name 01/01/18 1532   Source of Stress Concerns  Chronic Illness;Poor Coping Skills;Family          Psychosocial Discharge (Final Psychosocial Re-Evaluation): Psychosocial Re-Evaluation - 01/01/18 1532    Psychosocial Re-Evaluation          Current issues with  Current Stress Concerns;Current Anxiety/Panic    Comments  pt with health related anxiety and situational stress from sister with cancer. pt encouraged to meet with Jeanella Craze.  pt will let staff know if she is ready to schedule appointment.     Expected Outcomes  pt will exhibit improved outlook with good coping skills.     Interventions  Stress management education;Encouraged to attend  Cardiac Rehabilitation for the exercise;Relaxation education    Continue Psychosocial Services   Follow up required by staff    Comments  pt offered Jeanella Craze counseling services.         Initial Review          Source of Stress Concerns  Chronic Illness;Poor Coping Skills;Family           Vocational Rehabilitation: Provide vocational rehab assistance to qualifying candidates.   Vocational Rehab Evaluation & Intervention: Vocational Rehab - 12/19/17 1124    Initial Vocational Rehab Evaluation & Intervention          Assessment shows need for Vocational Rehabilitation  No Shamicka is retired and does not need vocational rehab at this time            Education: Education Goals: Education classes will be provided on a weekly basis, covering required topics. Participant will state understanding/return demonstration of topics presented.  Learning Barriers/Preferences: Learning Barriers/Preferences - 12/19/17 0910    Learning Barriers/Preferences          Learning Barriers  Sight    Learning Preferences  Video;Pictoral           Education Topics: Count Your Pulse:  -Group instruction provided by verbal instruction, demonstration, patient participation and written materials to support subject.  Instructors address importance of being able to find your pulse and how to count your pulse when at home without a heart monitor.  Patients get hands on experience counting their pulse with staff help and individually.   Heart Attack, Angina, and Risk Factor Modification:  -Group instruction provided by verbal instruction, video, and written materials to support subject.  Instructors address signs and symptoms of angina and heart attacks.    Also discuss risk factors for heart disease and how to make changes to improve heart health risk factors.   Functional Fitness:  -Group instruction provided by verbal instruction, demonstration, patient participation, and written materials to support subject.  Instructors address safety measures for doing things around the house.  Discuss how to get up and down off the floor, how to pick things up properly, how to safely get out of a chair without assistance, and balance training. Flowsheet Row CARDIAC REHAB PHASE II EXERCISE from 01/03/2018 in [place]  Date  12/29/17  Educator  EP  Instruction Review Code  2- Demonstrated Understanding      Meditation and Mindfulness:  -Group instruction provided by verbal instruction, patient participation, and written materials to support subject.  Instructor addresses importance of mindfulness and meditation practice to help  reduce stress and improve awareness.  Instructor also leads participants through a meditation exercise.  Flowsheet Row CARDIAC REHAB PHASE II EXERCISE from 01/03/2018 in [place]  Date  01/03/18  Instruction Review Code  2- Demonstrated Understanding      Stretching for Flexibility and Mobility:  -Group instruction provided by verbal instruction, patient participation, and written materials to support subject.  Instructors lead participants through series of stretches that are designed to increase flexibility thus improving mobility.  These stretches are additional exercise for major muscle groups that are typically performed during regular warm up and cool down.   Hands Only CPR:  -Group verbal, video, and participation provides a basic overview of AHA guidelines for community CPR. Role-play of emergencies allow participants the opportunity to practice calling for help and chest compression technique with discussion of AED use.   Hypertension: -Group verbal and  written instruction that provides a basic overview of hypertension including the most recent diagnostic guidelines, risk factor reduction with self-care instructions and medication management.    Nutrition I class: Heart Healthy Eating:  -Group instruction provided by PowerPoint slides, verbal discussion, and written materials to support subject matter. The instructor gives an explanation and review of the Therapeutic Lifestyle Changes diet recommendations, which includes a discussion on lipid goals, dietary fat, sodium, fiber, plant stanol/sterol esters, sugar, and the components of a well-balanced, healthy diet.   Nutrition II class: Lifestyle Skills:  -Group instruction provided by PowerPoint slides, verbal discussion, and written materials to support subject matter. The instructor gives an explanation and review of label reading, grocery shopping for heart health, heart healthy recipe  modifications, and ways to make healthier choices when eating out.   Diabetes Question & Answer:  -Group instruction provided by PowerPoint slides, verbal discussion, and written materials to support subject matter. The instructor gives an explanation and review of diabetes co-morbidities, pre- and post-prandial blood glucose goals, pre-exercise blood glucose goals, signs, symptoms, and treatment of hypoglycemia and hyperglycemia, and foot care basics.   Diabetes Blitz:  -Group instruction provided by PowerPoint slides, verbal discussion, and written materials to support subject matter. The instructor gives an explanation and review of the physiology behind type 1 and type 2 diabetes, diabetes medications and rational behind using different medications, pre- and post-prandial blood glucose recommendations and Hemoglobin A1c goals, diabetes diet, and exercise including blood glucose guidelines for exercising safely.    Portion Distortion:  -Group instruction provided by PowerPoint slides, verbal discussion, written materials, and food models to support subject matter. The instructor gives an explanation of serving size versus portion size, changes in portions sizes over the last 20 years, and what consists of a serving from each food group.   Stress Management:  -Group instruction provided by verbal instruction, video, and written materials to support subject matter.  Instructors review role of stress in heart disease and how to cope with stress positively.     Exercising on Your Own:  -Group instruction provided by verbal instruction, power point, and written materials to support subject.  Instructors discuss benefits of exercise, components of exercise, frequency and intensity of exercise, and end points for exercise.  Also discuss use of nitroglycerin and activating EMS.  Review options of places to exercise outside of rehab.  Review guidelines for sex with heart disease.   Cardiac Drugs I:   -Group instruction provided by verbal instruction and written materials to support subject.  Instructor reviews cardiac drug classes: antiplatelets, anticoagulants, beta blockers, and statins.  Instructor discusses reasons, side effects, and lifestyle considerations for each drug class. Flowsheet Row CARDIAC REHAB PHASE II EXERCISE from 01/03/2018 in [place]  Date  12/27/17  Instruction Review Code  2- Demonstrated Understanding      Cardiac Drugs II:  -Group instruction provided by verbal instruction and written materials to support subject.  Instructor reviews cardiac drug classes: angiotensin converting enzyme inhibitors (ACE-I), angiotensin II receptor blockers (ARBs), nitrates, and calcium channel blockers.  Instructor discusses reasons, side effects, and lifestyle considerations for each drug class.   Anatomy and Physiology of the Circulatory System:  Group verbal and written instruction and models provide basic cardiac anatomy and physiology, with the coronary electrical and arterial systems. Review of: AMI, Angina, Valve disease, Heart Failure, Peripheral Artery Disease, Cardiac Arrhythmia, Pacemakers, and the ICD.   Other Education:  -Group or individual verbal, written, or  video instructions that support the educational goals of the cardiac rehab program.   Holiday Eating Survival Tips:  -Group instruction provided by PowerPoint slides, verbal discussion, and written materials to support subject matter. The instructor gives patients tips, tricks, and techniques to help them not only survive but enjoy the holidays despite the onslaught of food that accompanies the holidays.   Knowledge Questionnaire Score: Knowledge Questionnaire Score - 12/19/17 1031    Knowledge Questionnaire Score          Pre Score  22/24           Core Components/Risk Factors/Patient Goals at Admission: Personal Goals and Risk Factors at Admission - 12/19/17 1040     Core Components/Risk Factors/Patient Goals on Admission           Weight Management  Yes;Obesity;Weight Maintenance;Weight Loss    Intervention  Weight Management: Develop a combined nutrition and exercise program designed to reach desired caloric intake, while maintaining appropriate intake of nutrient and fiber, sodium and fats, and appropriate energy expenditure required for the weight goal.;Weight Management: Provide education and appropriate resources to help participant work on and attain dietary goals.;Weight Management/Obesity: Establish reasonable short term and long term weight goals.;Obesity: Provide education and appropriate resources to help participant work on and attain dietary goals.    Admit Weight  208 lb 1.8 oz (94.4 kg)    Goal Weight: Short Term  200 lb (90.7 kg)    Goal Weight: Long Term  190 lb (86.2 kg)    Expected Outcomes  Short Term: Continue to assess and modify interventions until short term weight is achieved;Long Term: Adherence to nutrition and physical activity/exercise program aimed toward attainment of established weight goal;Weight Maintenance: Understanding of the daily nutrition guidelines, which includes 25-35% calories from fat, 7% or less cal from saturated fats, less than 200mg  cholesterol, less than 1.5gm of sodium, & 5 or more servings of fruits and vegetables daily;Weight Loss: Understanding of general recommendations for a balanced deficit meal plan, which promotes 1-2 lb weight loss per week and includes a negative energy balance of [phone] kcal/d;Understanding recommendations for meals to include 15-35% energy as protein, 25-35% energy from fat, 35-60% energy from carbohydrates, less than 200mg  of dietary cholesterol, 20-35 gm of total fiber daily;Understanding of distribution of calorie intake throughout the day with the consumption of 4-5 meals/snacks    Improve shortness of breath with ADL's  Yes    Intervention  Provide education, individualized exercise  plan and daily activity instruction to help decrease symptoms of SOB with activities of daily living.    Expected Outcomes  Short Term: Improve cardiorespiratory fitness to achieve a reduction of symptoms when performing ADLs;Long Term: Be able to perform more ADLs without symptoms or delay the onset of symptoms    Heart Failure  Yes    Intervention  Provide a combined exercise and nutrition program that is supplemented with education, support and counseling about heart failure. Directed toward relieving symptoms such as shortness of breath, decreased exercise tolerance, and extremity edema.    Expected Outcomes  Improve functional capacity of life;Long term: Adoption of self-care skills and reduction of barriers for early signs and symptoms recognition and intervention leading to self-care maintenance.;Short term: Daily weights obtained and reported for increase. Utilizing diuretic protocols set by physician.;Short term: Attendance in program 2-3 days a week with increased exercise capacity. Reported lower sodium intake. Reported increased fruit and vegetable intake. Reports medication compliance.    Hypertension  Yes  Intervention  Provide education on lifestyle modifcations including regular physical activity/exercise, weight management, moderate sodium restriction and increased consumption of fresh fruit, vegetables, and low fat dairy, alcohol moderation, and smoking cessation.;Monitor prescription use compliance.    Expected Outcomes  Short Term: Continued assessment and intervention until BP is < 140/69mm HG in hypertensive participants. < 130/83mm HG in hypertensive participants with diabetes, heart failure or chronic kidney disease.;Long Term: Maintenance of blood pressure at goal levels.    Lipids  Yes    Intervention  Provide education and support for participant on nutrition & aerobic/resistive exercise along with prescribed medications to achieve LDL 70mg , HDL >40mg .    Expected Outcomes   Short Term: Participant states understanding of desired cholesterol values and is compliant with medications prescribed. Participant is following exercise prescription and nutrition guidelines.;Long Term: Cholesterol controlled with medications as prescribed, with individualized exercise RX and with personalized nutrition plan. Value goals: LDL < 70mg , HDL > 40 mg.    Stress  Yes    Intervention  Offer individual and/or small group education and counseling on adjustment to heart disease, stress management and health-related lifestyle change. Teach and support self-help strategies.;Refer participants experiencing significant psychosocial distress to appropriate mental health specialists for further evaluation and treatment. When possible, include family members and significant others in education/counseling sessions.    Expected Outcomes  Short Term: Participant demonstrates changes in health-related behavior, relaxation and other stress management skills, ability to obtain effective social support, and compliance with psychotropic medications if prescribed.;Long Term: Emotional wellbeing is indicated by absence of clinically significant psychosocial distress or social isolation.           Core Components/Risk Factors/Patient Goals Review:  Goals and Risk Factor Review    Core Components/Risk Factors/Patient Goals Review    Row Name 12/25/17 0941 01/01/18 1531   Personal Goals Review  Weight Management/Obesity;Improve shortness of breath with ADL's;Heart Failure;Hypertension;Stress;Lipids  Weight Management/Obesity;Improve shortness of breath with ADL's;Heart Failure;Hypertension;Stress;Lipids   Review  pt with multiple CAD RF demostrates eagerness to participate in CR program.  pt personal goals are to improve dypsnea and improve balance.   pt with multiple CAD RF demostrates eagerness to participate in CR program.  pt personal goals are to improve dypsnea and improve balance.    Expected Outcomes  pt  will participate in CR exercise, nutrition and lifestyle modifications to decrease overall RF.   pt will participate in CR exercise, nutrition and lifestyle modifications to decrease overall RF.           Core Components/Risk Factors/Patient Goals at Discharge (Final Review):  Goals and Risk Factor Review - 01/01/18 1531    Core Components/Risk Factors/Patient Goals Review          Personal Goals Review  Weight Management/Obesity;Improve shortness of breath with ADL's;Heart Failure;Hypertension;Stress;Lipids    Review  pt with multiple CAD RF demostrates eagerness to participate in CR program.  pt personal goals are to improve dypsnea and improve balance.     Expected Outcomes  pt will participate in CR exercise, nutrition and lifestyle modifications to decrease overall RF.            ITP Comments: ITP Comments    Row Name 12/19/17 0909 12/25/17 0915 01/01/18 1531   ITP Comments  Medical Director, Dr. Fransico Him, MD  pt started group exercise.  pt demonstrates eagerness to participate in CR program.   30day ITP review. pt with good attendance and participation.       Comments:

## 2018-01-04 NOTE — Patient Instructions (Addendum)
Medication Instructions:  1) INCREASE LASIX to 40 mg daily 2) START KDUR 10 meq daily  Labwork: None  Testing/Procedures: Dr. Burt Knack recommends you have a CHEST XRAY. Please proceed to [place] at [facility] to have this done. You do not need an appointment. [place]  Follow-Up: Your provider recommends that you schedule a follow-up appointment in 2 months with Ermalinda Barrios. I will call you to arrange this appointment when the schedule opens.    Any Other Special Instructions Will Be Listed Below (If Applicable).     If you need a refill on your cardiac medications before your next appointment, please call your pharmacy.

## 2018-01-04 NOTE — Progress Notes (Signed)
Cardiology Office Note Date:  01/04/2018   ID:  [name], Tracy [birth year], MRN [id number]  PCP:  Marletta Lor, MD  Cardiologist:  Sherren Mocha, MD    Chief Complaint  Patient presents with  . Shortness of Breath     History of Present Illness: Tracy [name] is a 72 y.o. female who presents for follow-up evaluation.  The patient has coronary artery disease and initially underwent stenting of the left circumflex with a bare-metal stent in 2007.  She then underwent repeat stenting of the circumflex with a drug-eluting stent platform in January 2019 when she developed recurrent exertional angina and a stress test demonstrating inferolateral ischemia.  The patient is here with her husband today.  She is been concerned about shortness of breath with activity.  She started cardiac rehab last week and complains of shortness of breath with exercise.  She has no resting shortness of breath, orthopnea, or PND.  She has occasional left-sided chest pain but there is no association with physical exertion.  She has pain when she touches an area on her left chest and it seems to be reproducible.  No cough or wheezing.  Past Medical History:  Diagnosis Date  . Allergic rhinitis   . CAD (coronary artery disease)    1/19 PCI/DES to [place] for ISR, normal EF.   Tracy [name] CHF (congestive heart failure) ([place])   . Gout   . Headache(784.0)   . Hyperlipidemia   . Hypertension   . Low back pain   . Menopausal syndrome   . Myocardial infarct ([place]) 2007   hx of  . Overactive bladder     Past Surgical History:  Procedure Laterality Date  . ANGIOPLASTY     stent 2007  . CARDIAC CATHETERIZATION    . COLONOSCOPY    . CORONARY STENT INTERVENTION N/A 11/06/2017   Procedure: CORONARY STENT INTERVENTION;  Surgeon: [name], MD;  Location: [facility];  Service: Cardiovascular;  Laterality: N/A;  . LEFT HEART CATH AND CORONARY ANGIOGRAPHY N/A 11/06/2017   Procedure: LEFT HEART  CATH AND CORONARY ANGIOGRAPHY;  Surgeon: [name], MD;  Location: [facility];  Service: Cardiovascular;  Laterality: N/A;  . TONSILLECTOMY      Current Outpatient Medications  Medication Sig Dispense Refill  . allopurinol (ZYLOPRIM) 100 MG tablet TAKE 1 TABLET BY MOUTH EVERY DAY 90 tablet 0  . ALPRAZolam (XANAX) 0.25 MG tablet TAKE 1 TABLET BY MOUTH TWICE A DAY AS NEEDED FOR ANXIETY 60 tablet 5  . aspirin EC 81 MG tablet Take 81 mg by mouth daily.    Tracy [name] atorvastatin (LIPITOR) 40 MG tablet TAKE 1 TABLET DAILY BY MOUTH. PLEASE KEEP UPCOMING APPT FOR FUTURE REFILLS. THANKS 90 tablet 3  . calcium carbonate (CALCIUM 600) 600 MG TABS tablet Take 600 mg by mouth See admin instructions. Once every 2 weeks    . cetirizine (ZYRTEC) 10 MG tablet Take 10 mg by mouth daily.    . Cholecalciferol (VITAMIN D3 PO) Take 1,000 Units by mouth daily.    . clopidogrel (PLAVIX) 75 MG tablet Take 1 tablet (75 mg total) by mouth daily with breakfast. 90 tablet 2  . Coenzyme Q10 (CO Q 10 PO) Take 300 mg by mouth See admin instructions. Once every 2 weeks.    . Cyanocobalamin (VITAMIN B-12) 5000 MCG SUBL Place 5,000 mcg under the tongue daily.    . furosemide (LASIX) 40 MG tablet Take 1 tablet (40 mg total) by  mouth daily. 90 tablet 3  . losartan (COZAAR) 100 MG tablet TAKE 1 TABLET (100 MG TOTAL) BY MOUTH DAILY. 90 tablet 3  . metoprolol tartrate (LOPRESSOR) 100 MG tablet TAKE 1 TABLET (100 MG TOTAL) BY MOUTH 2 (TWO) TIMES DAILY. 180 tablet 1  . Misc Natural Products (TART CHERRY ADVANCED) CAPS Take 1,000 capsules by mouth daily.     . nitroGLYCERIN (NITROSTAT) 0.4 MG SL tablet Place 1 tablet (0.4 mg total) under the tongue every 5 (five) minutes as needed for chest pain. 25 tablet 3  . Omega-3 Fatty Acids (FISH OIL OMEGA-3 PO) Take 1,400 mg by mouth See admin instructions. Once every couple weeks    . pantoprazole (PROTONIX) 40 MG tablet TAKE 1 TABLET (40 MG TOTAL) BY MOUTH DAILY. 90 tablet 1  . vitamin  E 200 UNIT capsule Take 200 Units by mouth daily.    . potassium chloride (K-DUR) 10 MEQ tablet Take 1 tablet (10 mEq total) by mouth daily. 90 tablet 3   No current facility-administered medications for this visit.     Allergies:   Erythromycin and Sulfamethoxazole   Social History:  The patient  reports that she quit smoking about 5 years ago. She has never used smokeless tobacco. She reports that she does not drink alcohol or use drugs.   Family History:  The patient's family history includes Colon cancer (age of onset: 29) in her mother; Other in her unknown relative. She was adopted.    ROS:  Please see the history of present illness.  Otherwise, review of systems is positive for weight gain, fatigue, leg pain, snoring, balance problems.  All other systems are reviewed and negative.    PHYSICAL EXAM: VS:  BP 116/72   Pulse 87   Ht '5\' 5"'$  (1.651 m)   Wt 207 lb (93.9 kg)   SpO2 94%   BMI 34.45 kg/m  , BMI Body mass index is 34.45 kg/m. GEN: Well nourished, well developed, in no acute distress  HEENT: normal  Neck: no JVD, no masses. No carotid bruits Cardiac: RRR without murmur or gallop                Respiratory:  clear to auscultation bilaterally, normal work of breathing GI: soft, nontender, nondistended, + BS MS: no deformity or atrophy  Ext: no pretibial edema, pedal pulses 2+= bilaterally Skin: warm and dry, no rash Neuro:  Strength and sensation are intact Psych: euthymic mood, full affect  EKG:  EKG is not ordered today.  Recent Labs: 07/31/2017: ALT 37 09/04/2017: NT-Pro BNP 480 11/03/2017: Hemoglobin 11.8; Platelets 213 11/14/2017: BUN 28; Creatinine, Ser 1.20; Potassium 4.6; Sodium 143   Lipid Panel     Component Value Date/Time   CHOL 154 09/13/2016 0738   TRIG 175 (H) 09/13/2016 0738   HDL 31 (L) 09/13/2016 0738   CHOLHDL 5.0 (H) 09/13/2016 0738   VLDL 35 (H) 09/13/2016 0738   LDLCALC 88 09/13/2016 0738   LDLDIRECT 111.0 01/05/2015 1019      Wt  Readings from Last 3 Encounters:  01/04/18 207 lb (93.9 kg)  12/19/17 208 lb 1.8 oz (94.4 kg)  11/14/17 206 lb 1.9 oz (93.5 kg)     Cardiac Studies Reviewed: Cardiac Cath 11-06-2017: Conclusion   Conclusions: 1. Mild to moderate coronary artery disease involving LAD and RCA, not significantly change since 2011/20112. 2. 50-60% in-stent restenosis in proximal portion of mid LCx stent, corresponding to area of ischemia on recent myocardial perfusion stress test. 3.  Mildly elevated left ventricular filling pressure.. 4. Successful PCI to proximal/mid LCx with placement of a Synergy 3.0 x 20 mm DES with 0% residual stenosis and TIMI-3 flow.  Recommendations: 1. Dual antiplatelet therapy with aspirin and clopidogrel for at least 6 months. 2. Aggressive secondary prevention. 3. Anticipate same-day discharge if patient remains hemodynamically stable and asymptomatic.  [name], MD [facility] Pager: [phone]                  Indications   Shortness of breath [R06.02 (ICD-10-CM)]  CAD in native artery [I25.10 (ICD-10-CM)]  Abnormal stress ECG [R94.39 (ICD-10-CM)]  Procedural Details/Technique   Technical Details Indication: 72 y.o. year-old woman with history of coronary artery disease status post PCI to the LCx (2011), chronic diastolic heart failure, hypertension, hyperlipidemia, and obesity, presenting for evaluation of progressive exertional dyspnea and abnormal stress test with inferolateral ischemia.  GFR: 44 ml/min  Procedure: The risks, benefits, complications, treatment options, and expected outcomes were discussed with the patient. The patient and/or family concurred with the proposed plan, giving informed consent. The patient was brought to the cath lab after IV hydration was begun and oral premedication was given. The patient was further sedated with Versed and Fentanyl. The right wrist was assessed with a modified Allens test which was normal. The right  wrist was prepped and draped in a sterile fashion. 1% lidocaine was used for local anesthesia. Using the modified Seldinger access technique, a 37F slender Glidesheath was placed in the right radial artery. 3 mg Verapamil was given through the sheath. Heparin 4,500 units were administered.  Selective coronary angiography was performed using 63F JL3.5 and JR4 catheters to engage the left and right coronary arteries, respectively. Left heart catheterization was performed using a 63F JR4 catheter. Left ventriculogram was not performed.  PCI to LCx Heparin was used for anticoagulation. The patient was loaded with clopidogrel. The left coronary artery was engaged with a 37F EBU 3.5 guide catheter. Due to resistance noted in the right forearm with guide catheter advancement, balloon assisted tracking using a Runthrough wire and sapphire 2.0 x 12 mm balloon was utilized. The Runthrough was advanced into the distal LCx. I attempted to cross the lesion with a Wolverine 2.75 x 10 mm cutting balloon without success fear. Therefore, the proximal and mid portions of the LCx stent were predilated with the sapphire 2.0 x 12 mm balloon at 12 atm. I was then able to cross the in-stent restenosis with the [facility] balloon, which was inflated to 12 atm. I then covered the in-stent restenosis and proximal edge stenosis with a Synergy 3.0 x 20 mm drug-eluting stent, deployed at 12 atm and postdilated with the same balloon at 14 atm. Additional post dilation was performed an [facility] Sapphire 3.25 x 15 mm balloon at 16 atm. Final angiogram demonstrates 0% residual stenosis with TIMI-3 flow.  Due to pain in the right forearm following guide catheter removal, right radial sheath angiogram was performed and revealed a patent vessel.  At the end of the procedure, the radial artery sheath was removed and a TR band applied to achieve patent hemostasis. There were no immediate complications. The patient was taken to the recovery area in stable  condition.  Contrast used: 145 mL Isovue Fluoroscopy time: 20.2 min Radiation dose: 1,149 mGy   Estimated blood loss <50 mL.  During this procedure the patient was administered the following to achieve and maintain moderate conscious sedation: Versed 2 mg, Fentanyl 100 mcg, while the patient's  heart rate, blood pressure, and oxygen saturation were continuously monitored. The period of conscious sedation was 85 minutes, of which I was present face-to-face 100% of this time.  Complications   Complications documented before study signed (11/06/2017 2:03 PM EST)    No complications were associated with this study.  Documented by [name], MD - 11/06/2017 1:59 PM EST    Coronary Findings   Diagnostic  Dominance: Right  Left Main  Vessel was injected. Vessel is large.  Left Anterior Descending  Vessel was injected. Vessel is large. The vessel is moderately tortuous. Overall appearance is not significantly different than prior studies in 2011 and 2012.  Ost LAD lesion 30% stenosed  Ost LAD lesion is 30% stenosed. The lesion is eccentric. The lesion is calcified.  Prox LAD to Mid LAD lesion 50% stenosed  Prox LAD to Mid LAD lesion is 50% stenosed. The lesion is eccentric. The lesion is moderately calcified.  Mid LAD lesion 50% stenosed  Mid LAD lesion is 50% stenosed.  Mid LAD to Dist LAD lesion 40% stenosed  Mid LAD to Dist LAD lesion is 40% stenosed.  First Diagonal Branch  Vessel is small in size.  Second Diagonal Branch  Vessel is large in size.  Third Diagonal Branch  Vessel is small in size.  Left Circumflex  Vessel was injected. Vessel is large.  Ost Cx lesion 25% stenosed  Ost Cx lesion is 25% stenosed.  Prox Cx lesion 25% stenosed  Prox Cx lesion is 25% stenosed.  Prox Cx to Mid Cx lesion 60% stenosed  Prox Cx to Mid Cx lesion is 60% stenosed. The lesion was previously treated using a bare metal stent over 2 years ago. Previously placed stent displays  restenosis. Proximal stent has in-stent restenosis of up to 50-60%.  First Obtuse Marginal Branch  Vessel is small in size.  Second Obtuse Marginal Branch  Vessel is small in size.  Third Obtuse Marginal Branch  Vessel is large in size.  Right Coronary Artery  Vessel was injected. Vessel is large. The vessel is mildly tortuous.  Prox RCA to Mid RCA lesion 25% stenosed  Prox RCA to Mid RCA lesion is 25% stenosed. The lesion is eccentric. The lesion is calcified.  Intervention   Prox Cx lesion  Stent (Also treats lesions: Prox Cx to Mid Cx)  A drug-eluting stent was successfully placed using a STENT SYNERGY DES 3X20. Maximum pressure: 14 atm. Stent strut is well apposed. Stent overlaps previously placed stent.  Post-Intervention Lesion Assessment  There is no residual stenosis post intervention.  Prox Cx to Mid Cx lesion  Stent (Also treats lesions: Prox Cx)  A drug-eluting stent was successfully placed using a STENT SYNERGY DES 3X20. Maximum pressure: 14 atm. Stent strut is well apposed. Stent overlaps previously placed stent.  Post-Intervention Lesion Assessment  There is no residual stenosis post intervention.  Left Heart   Left Ventricle LV end diastolic pressure is mildly elevated. LVEDP 20-22 mmHg.  Coronary Diagrams   Diagnostic Diagram       Post-Intervention Diagram        Echo 09-29-2017: Left ventricle:  The cavity size was normal. Wall thickness was increased in a pattern of mild LVH. Systolic function was normal. The estimated ejection fraction was in the range of 50% to 55%. Wall motion was normal; there were no regional wall motion abnormalities. Doppler parameters are consistent with abnormal left ventricular relaxation (grade 1 diastolic dysfunction).  ------------------------------------------------------------------- Aortic valve:   Structurally normal valve.  Cusp separation was normal.  Doppler:  Transvalvular velocity was within the normal range.  There was no stenosis. There was no regurgitation.  ------------------------------------------------------------------- Aorta:  Aortic root: The aortic root was normal in size. Ascending aorta: The ascending aorta was normal in size.  ------------------------------------------------------------------- Mitral valve:   Structurally normal valve.   Leaflet separation was normal.  Doppler:  Transvalvular velocity was within the normal range. There was no evidence for stenosis. There was no regurgitation.  ------------------------------------------------------------------- Left atrium:  The atrium was normal in size.  ------------------------------------------------------------------- Right ventricle:  The cavity size was normal. Systolic function was normal.  ------------------------------------------------------------------- Pulmonic valve:    The valve appears to be grossly normal. Doppler:  There was no significant regurgitation.  ------------------------------------------------------------------- Tricuspid valve:   Structurally normal valve.   Leaflet separation was normal.  Doppler:  Transvalvular velocity was within the normal range. There was trivial regurgitation.  ------------------------------------------------------------------- Right atrium:  The atrium was normal in size.  ------------------------------------------------------------------- Pericardium:  There was no pericardial effusion.  Myoview 10-13-2017: Study Highlights     Nuclear stress EF: 51%.  There was no ST segment deviation noted during stress.  Defect 1: There is a medium defect of moderate severity present in the basal inferior, basal inferolateral, mid inferior and mid inferolateral location.  This is an intermediate risk study.  Findings consistent with ischemia.  The left ventricular ejection fraction is mildly decreased (45-54%).   There is a medium size, moderate severity  reversible defect in the basal and mid inferior and inferolateral walls consistent with ischemia (SDS =4).    ASSESSMENT AND PLAN: 1.  CAD, native vessel, with angina: Primarily atypical symptoms noted.  We will continue her current medical therapy.  I think it is unlikely that the patient's shortness of breath is related to obstructive CAD.  2.  Chronic diastolic heart failure, New York Heart Association functional class II symptoms: No clear evidence of volume excess on exam, but the patient feels some relief in her chest with increasing her Lasix dose.  I recommended that she increase furosemide to 40 mg daily and add potassium chloride 10 mEq daily.  We will repeat a metabolic panel in a few weeks.  Her most recent echocardiogram from just a few months ago is reviewed and it showed only grade 1 diastolic dysfunction with normal LV systolic function.  3.  Hypertension: Blood pressure is well controlled.  4.  Hyperlipidemia: Treated with atorvastatin.  Lifestyle modification reviewed at length.  5.  Shortness of breath: We will check a chest x-ray for completeness.  I trended the patient's weight and she has gained 25 pounds over the last 18 months.  I think this is contributing significantly to her symptoms.  I recommended a continued focus on exercise, dietary changes, and weight loss.  If she has persistent symptoms she will notify me.  Will arrange follow-up in about 3 months.  There is no evidence of fluid overload on her exam.  Current medicines are reviewed with the patient today.  The patient does not have concerns regarding medicines.  Labs/ tests ordered today include:   Orders Placed This Encounter  Procedures  . DG Chest 2 View    Disposition:   FU 2-3 months with APP  Signed, Sherren Mocha, MD  01/04/2018 1:31 PM    [facility] [place], [place], [place]  [address] Phone: [phone]; Fax: [phone]

## 2018-01-05 ENCOUNTER — Encounter (HOSPITAL_COMMUNITY)
Admission: RE | Admit: 2018-01-05 | Discharge: 2018-01-05 | Disposition: A | Discharge status: Home / Self Care | Payer: Medicare Other | Source: Ambulatory Visit | Attending: Cardiovascular Disease | Admitting: Cardiovascular Disease

## 2018-01-05 DIAGNOSIS — I110 Hypertensive heart disease with heart failure: Secondary | ICD-10-CM | POA: Diagnosis not present

## 2018-01-05 DIAGNOSIS — Z87891 Personal history of nicotine dependence: Secondary | ICD-10-CM | POA: Diagnosis not present

## 2018-01-05 DIAGNOSIS — Z955 Presence of coronary angioplasty implant and graft: Secondary | ICD-10-CM | POA: Diagnosis not present

## 2018-01-05 DIAGNOSIS — Z79899 Other long term (current) drug therapy: Secondary | ICD-10-CM | POA: Diagnosis not present

## 2018-01-05 DIAGNOSIS — E785 Hyperlipidemia, unspecified: Secondary | ICD-10-CM | POA: Diagnosis not present

## 2018-01-05 DIAGNOSIS — I509 Heart failure, unspecified: Secondary | ICD-10-CM | POA: Diagnosis not present

## 2018-01-05 NOTE — Progress Notes (Signed)
Reviewed home exercise with pt today.  Pt [place] mall, 3x[place]week for exercise in addition to coming to cardiac rehab.  Reviewed THR, pulse, RPE, sign and symptoms, NTG use, and when to call 911 or MD.  Also discussed weather considerations and indoor options.  Pt voiced understanding.   Chaia Ikard [employer]

## 2018-01-08 ENCOUNTER — Encounter (HOSPITAL_COMMUNITY)
Admission: RE | Admit: 2018-01-08 | Discharge: 2018-01-08 | Disposition: A | Discharge status: Home / Self Care | Payer: Medicare Other | Source: Ambulatory Visit | Attending: Cardiovascular Disease | Admitting: Cardiovascular Disease

## 2018-01-08 DIAGNOSIS — Z87891 Personal history of nicotine dependence: Secondary | ICD-10-CM | POA: Diagnosis not present

## 2018-01-08 DIAGNOSIS — Z955 Presence of coronary angioplasty implant and graft: Secondary | ICD-10-CM

## 2018-01-08 DIAGNOSIS — I509 Heart failure, unspecified: Secondary | ICD-10-CM | POA: Diagnosis not present

## 2018-01-08 DIAGNOSIS — E785 Hyperlipidemia, unspecified: Secondary | ICD-10-CM | POA: Diagnosis not present

## 2018-01-08 DIAGNOSIS — Z79899 Other long term (current) drug therapy: Secondary | ICD-10-CM | POA: Diagnosis not present

## 2018-01-08 DIAGNOSIS — I110 Hypertensive heart disease with heart failure: Secondary | ICD-10-CM | POA: Diagnosis not present

## 2018-01-09 ENCOUNTER — Telehealth: Payer: Self-pay | Admitting: Cardiovascular Disease

## 2018-01-09 NOTE — Telephone Encounter (Signed)
Patient called for results.  Informed her there was no acute abnormality noted and to continue current treatment plan. She understands she will be called if Dr. Burt Knack has further recommendations.

## 2018-01-09 NOTE — Telephone Encounter (Signed)
New Message    Please call patient she is inquiring about her chest xray results

## 2018-01-10 ENCOUNTER — Encounter (HOSPITAL_COMMUNITY)
Admission: RE | Admit: 2018-01-10 | Discharge: 2018-01-10 | Disposition: A | Discharge status: Home / Self Care | Payer: Medicare Other | Source: Ambulatory Visit | Attending: Cardiovascular Disease | Admitting: Cardiovascular Disease

## 2018-01-10 ENCOUNTER — Other Ambulatory Visit: Payer: Self-pay

## 2018-01-10 DIAGNOSIS — I509 Heart failure, unspecified: Secondary | ICD-10-CM | POA: Diagnosis not present

## 2018-01-10 DIAGNOSIS — Z79899 Other long term (current) drug therapy: Secondary | ICD-10-CM | POA: Diagnosis not present

## 2018-01-10 DIAGNOSIS — E785 Hyperlipidemia, unspecified: Secondary | ICD-10-CM | POA: Diagnosis not present

## 2018-01-10 DIAGNOSIS — I110 Hypertensive heart disease with heart failure: Secondary | ICD-10-CM | POA: Diagnosis not present

## 2018-01-10 DIAGNOSIS — Z955 Presence of coronary angioplasty implant and graft: Secondary | ICD-10-CM | POA: Diagnosis not present

## 2018-01-10 DIAGNOSIS — Z87891 Personal history of nicotine dependence: Secondary | ICD-10-CM | POA: Diagnosis not present

## 2018-01-10 MED ORDER — ALLOPURINOL 100 MG PO TABS: 100.0000 mg | ORAL_TABLET | Freq: Every day | ORAL | 0 refills | Status: DC

## 2018-01-12 ENCOUNTER — Encounter (HOSPITAL_COMMUNITY)
Admission: RE | Admit: 2018-01-12 | Discharge: 2018-01-12 | Disposition: A | Discharge status: Home / Self Care | Payer: Medicare Other | Source: Ambulatory Visit | Attending: Cardiovascular Disease | Admitting: Cardiovascular Disease

## 2018-01-12 ENCOUNTER — Encounter (HOSPITAL_COMMUNITY): Payer: Self-pay

## 2018-01-12 DIAGNOSIS — I110 Hypertensive heart disease with heart failure: Secondary | ICD-10-CM | POA: Diagnosis not present

## 2018-01-12 DIAGNOSIS — Z87891 Personal history of nicotine dependence: Secondary | ICD-10-CM | POA: Diagnosis not present

## 2018-01-12 DIAGNOSIS — Z955 Presence of coronary angioplasty implant and graft: Secondary | ICD-10-CM

## 2018-01-12 DIAGNOSIS — I509 Heart failure, unspecified: Secondary | ICD-10-CM | POA: Diagnosis not present

## 2018-01-12 DIAGNOSIS — Z79899 Other long term (current) drug therapy: Secondary | ICD-10-CM | POA: Diagnosis not present

## 2018-01-12 DIAGNOSIS — E785 Hyperlipidemia, unspecified: Secondary | ICD-10-CM | POA: Diagnosis not present

## 2018-01-15 ENCOUNTER — Encounter (HOSPITAL_COMMUNITY)
Admission: RE | Admit: 2018-01-15 | Discharge: 2018-01-15 | Disposition: A | Discharge status: Home / Self Care | Payer: Medicare Other | Source: Ambulatory Visit | Attending: Cardiovascular Disease | Admitting: Cardiovascular Disease

## 2018-01-15 DIAGNOSIS — Z955 Presence of coronary angioplasty implant and graft: Secondary | ICD-10-CM | POA: Diagnosis not present

## 2018-01-15 DIAGNOSIS — Z79899 Other long term (current) drug therapy: Secondary | ICD-10-CM | POA: Diagnosis not present

## 2018-01-15 DIAGNOSIS — Z87891 Personal history of nicotine dependence: Secondary | ICD-10-CM | POA: Insufficient documentation

## 2018-01-15 DIAGNOSIS — E785 Hyperlipidemia, unspecified: Secondary | ICD-10-CM | POA: Diagnosis not present

## 2018-01-15 DIAGNOSIS — I110 Hypertensive heart disease with heart failure: Secondary | ICD-10-CM | POA: Insufficient documentation

## 2018-01-15 DIAGNOSIS — I509 Heart failure, unspecified: Secondary | ICD-10-CM | POA: Diagnosis not present

## 2018-01-17 ENCOUNTER — Encounter (HOSPITAL_COMMUNITY)
Admission: RE | Admit: 2018-01-17 | Discharge: 2018-01-17 | Disposition: A | Discharge status: Home / Self Care | Payer: Medicare Other | Source: Ambulatory Visit | Attending: Cardiovascular Disease | Admitting: Cardiovascular Disease

## 2018-01-17 DIAGNOSIS — Z79899 Other long term (current) drug therapy: Secondary | ICD-10-CM | POA: Diagnosis not present

## 2018-01-17 DIAGNOSIS — E785 Hyperlipidemia, unspecified: Secondary | ICD-10-CM | POA: Diagnosis not present

## 2018-01-17 DIAGNOSIS — Z955 Presence of coronary angioplasty implant and graft: Secondary | ICD-10-CM

## 2018-01-17 DIAGNOSIS — Z87891 Personal history of nicotine dependence: Secondary | ICD-10-CM | POA: Diagnosis not present

## 2018-01-17 DIAGNOSIS — I110 Hypertensive heart disease with heart failure: Secondary | ICD-10-CM | POA: Diagnosis not present

## 2018-01-17 DIAGNOSIS — I509 Heart failure, unspecified: Secondary | ICD-10-CM | POA: Diagnosis not present

## 2018-01-18 ENCOUNTER — Ambulatory Visit: Payer: Medicare Other | Admitting: Cardiovascular Disease

## 2018-01-19 ENCOUNTER — Encounter (HOSPITAL_COMMUNITY)
Admission: RE | Admit: 2018-01-19 | Discharge: 2018-01-19 | Disposition: A | Discharge status: Home / Self Care | Payer: Medicare Other | Source: Ambulatory Visit | Attending: Cardiovascular Disease | Admitting: Cardiovascular Disease

## 2018-01-19 DIAGNOSIS — Z955 Presence of coronary angioplasty implant and graft: Secondary | ICD-10-CM | POA: Diagnosis not present

## 2018-01-19 DIAGNOSIS — E785 Hyperlipidemia, unspecified: Secondary | ICD-10-CM | POA: Diagnosis not present

## 2018-01-19 DIAGNOSIS — Z87891 Personal history of nicotine dependence: Secondary | ICD-10-CM | POA: Diagnosis not present

## 2018-01-19 DIAGNOSIS — I110 Hypertensive heart disease with heart failure: Secondary | ICD-10-CM | POA: Diagnosis not present

## 2018-01-19 DIAGNOSIS — Z79899 Other long term (current) drug therapy: Secondary | ICD-10-CM | POA: Diagnosis not present

## 2018-01-19 DIAGNOSIS — I509 Heart failure, unspecified: Secondary | ICD-10-CM | POA: Diagnosis not present

## 2018-01-19 NOTE — Progress Notes (Signed)
OUTPATIENT CARDIAC REHAB  PMH:  DES Circ   Primary Cardiologist:  Dr. Burt Knack   Pt arrived at cardiac rehab c/o dyspnea on exertion. Pt noticably short of breath. Pt weight up 2.1kg within past 4 days. Lung sounds: Faint Rales in bases, otherwise clear, trace pedal edema. Pt reports she has not taken lasix this week due to inconvenience of frequent urination and often forgetting to take her dose. Pt tolerated exercise without difficulty. Pt instructed to take lasix 40mg  daily as prescribed, being careful to avoid missed doses for next 3 days. Pt verbalized understanding. Pt states she would like to begin bringing her dose with her to rehab to take when class is finished instead of waiting to get home. Pt congratulated on this suggestion.  Andi Hence, RN, BSN Cardiac Pulmonary Rehab 01/19/18 9:19 AM

## 2018-01-22 ENCOUNTER — Encounter (HOSPITAL_COMMUNITY)
Admission: RE | Admit: 2018-01-22 | Discharge: 2018-01-22 | Disposition: A | Discharge status: Home / Self Care | Payer: Medicare Other | Source: Ambulatory Visit | Attending: Cardiovascular Disease | Admitting: Cardiovascular Disease

## 2018-01-22 DIAGNOSIS — Z955 Presence of coronary angioplasty implant and graft: Secondary | ICD-10-CM | POA: Diagnosis not present

## 2018-01-22 DIAGNOSIS — E785 Hyperlipidemia, unspecified: Secondary | ICD-10-CM | POA: Diagnosis not present

## 2018-01-22 DIAGNOSIS — I110 Hypertensive heart disease with heart failure: Secondary | ICD-10-CM | POA: Diagnosis not present

## 2018-01-22 DIAGNOSIS — Z79899 Other long term (current) drug therapy: Secondary | ICD-10-CM | POA: Diagnosis not present

## 2018-01-22 DIAGNOSIS — I509 Heart failure, unspecified: Secondary | ICD-10-CM | POA: Diagnosis not present

## 2018-01-22 DIAGNOSIS — Z87891 Personal history of nicotine dependence: Secondary | ICD-10-CM | POA: Diagnosis not present

## 2018-01-24 ENCOUNTER — Encounter (HOSPITAL_COMMUNITY)
Admission: RE | Admit: 2018-01-24 | Discharge: 2018-01-24 | Disposition: A | Discharge status: Home / Self Care | Payer: Medicare Other | Source: Ambulatory Visit | Attending: Cardiovascular Disease | Admitting: Cardiovascular Disease

## 2018-01-24 DIAGNOSIS — Z79899 Other long term (current) drug therapy: Secondary | ICD-10-CM | POA: Diagnosis not present

## 2018-01-24 DIAGNOSIS — Z955 Presence of coronary angioplasty implant and graft: Secondary | ICD-10-CM | POA: Diagnosis not present

## 2018-01-24 DIAGNOSIS — Z87891 Personal history of nicotine dependence: Secondary | ICD-10-CM | POA: Diagnosis not present

## 2018-01-24 DIAGNOSIS — I110 Hypertensive heart disease with heart failure: Secondary | ICD-10-CM | POA: Diagnosis not present

## 2018-01-24 DIAGNOSIS — E785 Hyperlipidemia, unspecified: Secondary | ICD-10-CM | POA: Diagnosis not present

## 2018-01-24 DIAGNOSIS — I509 Heart failure, unspecified: Secondary | ICD-10-CM | POA: Diagnosis not present

## 2018-01-26 ENCOUNTER — Encounter (HOSPITAL_COMMUNITY)
Admission: RE | Admit: 2018-01-26 | Discharge: 2018-01-26 | Disposition: A | Discharge status: Home / Self Care | Payer: Medicare Other | Source: Ambulatory Visit | Attending: Cardiovascular Disease | Admitting: Cardiovascular Disease

## 2018-01-26 DIAGNOSIS — Z87891 Personal history of nicotine dependence: Secondary | ICD-10-CM | POA: Diagnosis not present

## 2018-01-26 DIAGNOSIS — Z955 Presence of coronary angioplasty implant and graft: Secondary | ICD-10-CM

## 2018-01-26 DIAGNOSIS — I110 Hypertensive heart disease with heart failure: Secondary | ICD-10-CM | POA: Diagnosis not present

## 2018-01-26 DIAGNOSIS — Z79899 Other long term (current) drug therapy: Secondary | ICD-10-CM | POA: Diagnosis not present

## 2018-01-26 DIAGNOSIS — E785 Hyperlipidemia, unspecified: Secondary | ICD-10-CM | POA: Diagnosis not present

## 2018-01-26 DIAGNOSIS — I509 Heart failure, unspecified: Secondary | ICD-10-CM | POA: Diagnosis not present

## 2018-01-29 ENCOUNTER — Encounter (HOSPITAL_COMMUNITY)
Admission: RE | Admit: 2018-01-29 | Discharge: 2018-01-29 | Disposition: A | Discharge status: Home / Self Care | Payer: Medicare Other | Source: Ambulatory Visit | Attending: Cardiovascular Disease | Admitting: Cardiovascular Disease

## 2018-01-29 DIAGNOSIS — Z955 Presence of coronary angioplasty implant and graft: Secondary | ICD-10-CM | POA: Diagnosis not present

## 2018-01-29 DIAGNOSIS — Z87891 Personal history of nicotine dependence: Secondary | ICD-10-CM | POA: Diagnosis not present

## 2018-01-29 DIAGNOSIS — I509 Heart failure, unspecified: Secondary | ICD-10-CM | POA: Diagnosis not present

## 2018-01-29 DIAGNOSIS — Z79899 Other long term (current) drug therapy: Secondary | ICD-10-CM | POA: Diagnosis not present

## 2018-01-29 DIAGNOSIS — E785 Hyperlipidemia, unspecified: Secondary | ICD-10-CM | POA: Diagnosis not present

## 2018-01-29 DIAGNOSIS — I110 Hypertensive heart disease with heart failure: Secondary | ICD-10-CM | POA: Diagnosis not present

## 2018-01-31 ENCOUNTER — Encounter (HOSPITAL_COMMUNITY)
Admission: RE | Admit: 2018-01-31 | Discharge: 2018-01-31 | Disposition: A | Discharge status: Home / Self Care | Payer: Medicare Other | Source: Ambulatory Visit | Attending: Cardiovascular Disease | Admitting: Cardiovascular Disease

## 2018-01-31 ENCOUNTER — Encounter (HOSPITAL_COMMUNITY): Payer: Self-pay

## 2018-01-31 DIAGNOSIS — Z79899 Other long term (current) drug therapy: Secondary | ICD-10-CM | POA: Diagnosis not present

## 2018-01-31 DIAGNOSIS — E785 Hyperlipidemia, unspecified: Secondary | ICD-10-CM | POA: Diagnosis not present

## 2018-01-31 DIAGNOSIS — I509 Heart failure, unspecified: Secondary | ICD-10-CM | POA: Diagnosis not present

## 2018-01-31 DIAGNOSIS — Z87891 Personal history of nicotine dependence: Secondary | ICD-10-CM | POA: Diagnosis not present

## 2018-01-31 DIAGNOSIS — I110 Hypertensive heart disease with heart failure: Secondary | ICD-10-CM | POA: Diagnosis not present

## 2018-01-31 DIAGNOSIS — Z955 Presence of coronary angioplasty implant and graft: Secondary | ICD-10-CM | POA: Diagnosis not present

## 2018-02-01 NOTE — Progress Notes (Signed)
Cardiac Individual Treatment Plan  Patient Details  Name: Tracy [name] MRN: [id number] Date of Birth: 1946/06/29 Referring Provider:   Flowsheet Row CARDIAC REHAB PHASE II ORIENTATION from 12/19/2017 in [facility]  Referring Provider  Sherren Mocha MD      Initial Encounter Date:  [place] PHASE II ORIENTATION from 12/19/2017 in [facility]  Date  12/19/17  Referring Provider  Sherren Mocha MD      Visit Diagnosis: Status post coronary artery stent placement 11/06/17  S/P DES CFX   Patient's Home Medications on Admission:  Current Outpatient Medications:  .  allopurinol (ZYLOPRIM) 100 MG tablet, Take 1 tablet (100 mg total) by mouth daily., Disp: 90 tablet, Rfl: 0 .  ALPRAZolam (XANAX) 0.25 MG tablet, TAKE 1 TABLET BY MOUTH TWICE A DAY AS NEEDED FOR ANXIETY, Disp: 60 tablet, Rfl: 5 .  aspirin EC 81 MG tablet, Take 81 mg by mouth daily., Disp: , Rfl:  .  atorvastatin (LIPITOR) 40 MG tablet, TAKE 1 TABLET DAILY BY MOUTH. PLEASE KEEP UPCOMING APPT FOR FUTURE REFILLS. THANKS, Disp: 90 tablet, Rfl: 3 .  calcium carbonate (CALCIUM 600) 600 MG TABS tablet, Take 600 mg by mouth See admin instructions. Once every 2 weeks, Disp: , Rfl:  .  cetirizine (ZYRTEC) 10 MG tablet, Take 10 mg by mouth daily., Disp: , Rfl:  .  Cholecalciferol (VITAMIN D3 PO), Take 1,000 Units by mouth daily., Disp: , Rfl:  .  clopidogrel (PLAVIX) 75 MG tablet, Take 1 tablet (75 mg total) by mouth daily with breakfast., Disp: 90 tablet, Rfl: 2 .  Coenzyme Q10 (CO Q 10 PO), Take 300 mg by mouth See admin instructions. Once every 2 weeks., Disp: , Rfl:  .  Cyanocobalamin (VITAMIN B-12) 5000 MCG SUBL, Place 5,000 mcg under the tongue daily., Disp: , Rfl:  .  furosemide (LASIX) 40 MG tablet, Take 1 tablet (40 mg total) by mouth daily., Disp: 90 tablet, Rfl: 3 .  losartan (COZAAR) 100 MG tablet, TAKE 1 TABLET (100 MG TOTAL) BY MOUTH  DAILY., Disp: 90 tablet, Rfl: 3 .  metoprolol tartrate (LOPRESSOR) 100 MG tablet, TAKE 1 TABLET (100 MG TOTAL) BY MOUTH 2 (TWO) TIMES DAILY., Disp: 180 tablet, Rfl: 1 .  Misc Natural Products (TART CHERRY ADVANCED) CAPS, Take 1,000 capsules by mouth daily. , Disp: , Rfl:  .  nitroGLYCERIN (NITROSTAT) 0.4 MG SL tablet, Place 1 tablet (0.4 mg total) under the tongue every 5 (five) minutes as needed for chest pain., Disp: 25 tablet, Rfl: 3 .  Omega-3 Fatty Acids (FISH OIL OMEGA-3 PO), Take 1,400 mg by mouth See admin instructions. Once every couple weeks, Disp: , Rfl:  .  pantoprazole (PROTONIX) 40 MG tablet, TAKE 1 TABLET (40 MG TOTAL) BY MOUTH DAILY., Disp: 90 tablet, Rfl: 1 .  potassium chloride (K-DUR) 10 MEQ tablet, Take 1 tablet (10 mEq total) by mouth daily., Disp: 90 tablet, Rfl: 3 .  vitamin E 200 UNIT capsule, Take 200 Units by mouth daily., Disp: , Rfl:   Past Medical History: Past Medical History:  Diagnosis Date  . Allergic rhinitis   . CAD (coronary artery disease)    1/19 PCI/DES to [facility] for ISR, normal EF.   Marland Kitchen CHF (congestive heart failure) ([place])   . Gout   . Headache(784.0)   . Hyperlipidemia   . Hypertension   . Low back pain   . Menopausal syndrome   . Myocardial infarct (  [place]) 2007   hx of  . Overactive bladder     Tobacco Use: Social History   Tobacco Use  Smoking Status Former Smoker  . Last attempt to quit: 07/01/2012  . Years since quitting: 5.5  Smokeless Tobacco Never Used    Labs: Recent Review Flowsheet Data    Labs for ITP Cardiac and Pulmonary Rehab Latest Ref Rng & Units 01/05/2015 06/19/2015 10/05/2015 04/05/2016 09/13/2016   Cholestrol <200 mg/dL 180 193 164 190 154   LDLCALC <100 mg/dL - 121(H) 92 119 88   LDLDIRECT mg/dL 111.0 - - - -   HDL >50 mg/dL 32.20(L) 34.00(L) 34(L) 41(L) 31(L)   Trlycerides <150 mg/dL 294.0(H) 189.0(H) 188(H) 150(H) 175(H)      Capillary Blood Glucose: No results found for: GLUCAP   Exercise Target Goals:     Exercise Program Goal: Individual exercise prescription set using results from initial 6 min walk test and THRR while considering  patient's activity barriers and safety.   Exercise Prescription Goal: Initial exercise prescription builds to 30-45 minutes a day of aerobic activity, 2-3 days per week.  Home exercise guidelines will be given to patient during program as part of exercise prescription that the participant will acknowledge.  Activity Barriers & Risk Stratification: Activity Barriers & Cardiac Risk Stratification - 12/19/17 0914    Activity Barriers & Cardiac Risk Stratification          Activity Barriers  Arthritis;Joint Problems;Deconditioning;Muscular Weakness;Back Problems;Other (comment);Balance Concerns;Shortness of Breath           6 Minute Walk: 6 Minute Walk    6 Minute Walk    Row Name 12/19/17 1031 12/19/17 1056   Distance  838 feet  no documentation   Walk Time  6 minutes  no documentation   # of Rest Breaks  0  no documentation   MPH  1.5  no documentation   METS  2.1  no documentation   RPE  13  no documentation   VO2 Peak  7.4  no documentation   Symptoms  No  no documentation   Resting HR  94 bpm  no documentation   Resting BP  142/82  no documentation   Resting Oxygen Saturation   96 %  no documentation   Exercise Oxygen Saturation  during 6 min walk  95 %  no documentation   Max Ex. HR  109 bpm  no documentation   Max Ex. BP  160/82  no documentation   2 Minute Post BP  104/70  120/78          Oxygen Initial Assessment:   Oxygen Re-Evaluation:   Oxygen Discharge (Final Oxygen Re-Evaluation):   Initial Exercise Prescription: Initial Exercise Prescription - 12/19/17 1000    Date of Initial Exercise RX and Referring Provider          Date  12/19/17    Referring Provider  Sherren Mocha MD        Treadmill          MPH  1.5    Grade  0    Minutes  10    METs  2.15        Recumbant Bike          Level  1.5    Watts  5     Minutes  10    METs  2.2        NuStep          Level  2  SPM  60    Minutes  10    METs  1.5        Prescription Details          Frequency (times per week)  3    Duration  Progress to 30 minutes of continuous aerobic without signs/symptoms of physical distress        Intensity          THRR 40-80% of Max Heartrate  60-121    Ratings of Perceived Exertion  11-13    Perceived Dyspnea  0-4        Progression          Progression  Continue to progress workloads to maintain intensity without signs/symptoms of physical distress.        Resistance Training          Training Prescription  Yes    Weight  2lbs    Reps  10-15           Perform Capillary Blood Glucose checks as needed.  Exercise Prescription Changes: Exercise Prescription Changes    Response to Exercise    Row Name 12/25/17 1541 01/01/18 1600 01/15/18 1420 01/29/18 1642   Blood Pressure (Admit)  140/78  108/64  120/80  116/82   Blood Pressure (Exercise)  152/78  142/62  138/76  142/76   Blood Pressure (Exit)  130/80  98/60  108/70  114/76   Heart Rate (Admit)  91 bpm  99 bpm  88 bpm  89 bpm   Heart Rate (Exercise)  113 bpm  111 bpm  111 bpm  115 bpm   Heart Rate (Exit)  87 bpm  94 bpm  93 bpm  99 bpm   Rating of Perceived Exertion (Exercise)  14  13  12  12    Symptoms  none  none  none  none   Comments  pt oriented to exercise equipment today  pt oriented to exercise equipment today  no documentation  no documentation   Duration  Progress to 30 minutes of  aerobic without signs/symptoms of physical distress  Progress to 30 minutes of  aerobic without signs/symptoms of physical distress  Progress to 30 minutes of  aerobic without signs/symptoms of physical distress  Progress to 30 minutes of  aerobic without signs/symptoms of physical distress   Intensity  THRR unchanged  THRR unchanged  THRR unchanged  THRR unchanged       Progression    Row Name 12/25/17 1541 01/01/18 1600 01/15/18 1420 01/29/18  1642   Progression  Continue to progress workloads to maintain intensity without signs/symptoms of physical distress.  Continue to progress workloads to maintain intensity without signs/symptoms of physical distress.  Continue to progress workloads to maintain intensity without signs/symptoms of physical distress.  Continue to progress workloads to maintain intensity without signs/symptoms of physical distress.   Average METs  2.2  1.9  2.3  2.7       Resistance Training    Row Name 12/25/17 1541 01/01/18 1600 01/15/18 1420 01/29/18 1642   Training Prescription  Yes  Yes  Yes  Yes   Weight  2lbs  2lbs  4lbs  4lbs   Reps  10-15  10-15  10-15  10-15   Time  10 Minutes  10 Minutes  10 Minutes  10 Minutes       Treadmill    Row Name 12/25/17 1541 01/01/18 1600 01/15/18 1420 01/29/18 1642   MPH  1.3  1.3  1.7  1.8   Grade  0  0  0  1   Minutes  5  10  10  10    METs  1.99  1.99  2.3  2.63       Recumbant Bike    Row Name 12/25/17 1541 01/01/18 1600 01/15/18 1420 01/29/18 1642   Level  1.5  1.5  2.5  2.5   Watts  15  15  10  10    Minutes  10  10  10  10    METs  2.5  1.6  2.3  2.4       NuStep    Row Name 12/25/17 1541 01/01/18 1600 01/15/18 1420 01/29/18 1642   Level  2  2  4  5    SPM  80  80  85  90   Minutes  10  10  10  10    METs  2.3  2.2  2.5  3       Track    Row Name 12/25/17 1541 01/01/18 1600 01/15/18 1420 01/29/18 1642   Laps  5  no documentation  no documentation  no documentation   Minutes  5  no documentation  no documentation  no documentation   METs  2  no documentation  no documentation  no documentation       [place] Name 12/25/17 1541 01/01/18 1600 01/15/18 1420 01/29/18 1642   Plans to continue exercise at  no documentation  no documentation  Home (comment)  Home (comment)   Frequency  no documentation  no documentation  Add 2 additional days to program exercise sessions.  Add 2 additional days to program exercise sessions.   Initial Home  Exercises Provided  no documentation  no documentation  01/05/18  01/05/18          Exercise Comments: Exercise Comments    Row Name 12/25/17 1545 01/01/18 1619 01/05/18 1045 01/30/18 1611   Exercise Comments  Pt responded well to first exercise session. Pt was able to exercise for 30 minutes without signs/symptoms of physical distress or pain.  Reviewed goals and activity levels. Pt is progressing well in cardiac rehab and is motivated to exercise for 30 minutes; will continue to monitor and progress WLs as able.   Reviewed HEP on 01/05/18  Reviewed METs and goals. Pt is progressing well in cardiac rehab. Pt handles WL increases very well. Cardiac rehab staff will continue to monitor and progress activity as tolerated.       Exercise Goals and Review: Exercise Goals    Exercise Goals    Row Name 12/19/17 0912   Increase Physical Activity  Yes   Intervention  Provide advice, education, support and counseling about physical activity/exercise needs.;Develop an individualized exercise prescription for aerobic and resistive training based on initial evaluation findings, risk stratification, comorbidities and participant's personal goals.   Expected Outcomes  Long Term: Exercising regularly at least 3-5 days a week.;Short Term: Attend rehab on a regular basis to increase amount of physical activity.;Long Term: Add in home exercise to make exercise part of routine and to increase amount of physical activity.   Increase Strength and Stamina  Yes improve walking tolerance/capacity   Intervention  Provide advice, education, support and counseling about physical activity/exercise needs.;Develop an individualized exercise prescription for aerobic and resistive training based on initial evaluation findings, risk stratification, comorbidities and participant's personal goals.   Expected Outcomes  Short Term: Increase workloads from initial exercise prescription  for resistance, speed, and METs.;Short Term:  Perform resistance training exercises routinely during rehab and add in resistance training at home;Long Term: Improve cardiorespiratory fitness, muscular endurance and strength as measured by increased METs and functional capacity (6MWT)   Able to understand and use rate of perceived exertion (RPE) scale  Yes   Intervention  Provide education and explanation on how to use RPE scale   Expected Outcomes  Short Term: Able to use RPE daily in rehab to express subjective intensity level;Long Term:  Able to use RPE to guide intensity level when exercising independently   Knowledge and understanding of Target Heart Rate Range (THRR)  Yes   Intervention  Provide education and explanation of THRR including how the numbers were predicted and where they are located for reference   Expected Outcomes  Short Term: Able to state/look up THRR;Long Term: Able to use THRR to govern intensity when exercising independently;Short Term: Able to use daily as guideline for intensity in rehab   Able to check pulse independently  Yes   Intervention  Provide education and demonstration on how to check pulse in carotid and radial arteries.;Review the importance of being able to check your own pulse for safety during independent exercise   Expected Outcomes  Short Term: Able to explain why pulse checking is important during independent exercise;Long Term: Able to check pulse independently and accurately   Understanding of Exercise Prescription  Yes   Intervention  Provide education, explanation, and written materials on patient's individual exercise prescription   Expected Outcomes  Short Term: Able to explain program exercise prescription;Long Term: Able to explain home exercise prescription to exercise independently          Exercise Goals Re-Evaluation : Exercise Goals Re-Evaluation    Exercise Goal Re-Evaluation    Row Name 01/01/18 1621 01/05/18 1046 01/30/18 1613   Exercise Goals Review  Increase Physical  Activity;Able to understand and use rate of perceived exertion (RPE) scale;Knowledge and understanding of Target Heart Rate Range (THRR);Understanding of Exercise Prescription;Increase Strength and Stamina;Able to check pulse independently  Increase Physical Activity;Able to understand and use rate of perceived exertion (RPE) scale;Knowledge and understanding of Target Heart Rate Range (THRR);Understanding of Exercise Prescription;Increase Strength and Stamina;Able to check pulse independently  Increase Physical Activity;Able to understand and use rate of perceived exertion (RPE) scale;Knowledge and understanding of Target Heart Rate Range (THRR);Understanding of Exercise Prescription;Increase Strength and Stamina;Able to check pulse independently   Comments  Pt has increased exercise tolerance on treadmill by walking for a full 10 minutes without stopping. Pt was happy about accomplishment and is eager to continue exercise prescription  Reviewed HEP [place]. Pt understands emergency precautions /activity limitations  [place] the mall for home exercise program. Pt was able to walk the entire first floor without extreme fatigue/exhaustion. Pt new goal is to be able to walk from the main entrance of the hospital to cardiac rehab (~1/86mi) without stopping or without  SOB/fatigue.   Expected Outcomes  Pt will continue to improve in cardiorespiratory and functional fitness  Pt will continue to improve in cardiorespiratory and functional fitness  Pt will continue to improve in cardiorespiratory and functional fitness           Discharge Exercise Prescription (Final Exercise Prescription Changes): Exercise Prescription Changes - 01/29/18 1642    Response to Exercise          Blood Pressure (Admit)  116/82  Blood Pressure (Exercise)  142/76    Blood Pressure (Exit)  114/76    Heart Rate (Admit)  89 bpm    Heart Rate (Exercise)  115 bpm    Heart Rate (Exit)  99  bpm    Rating of Perceived Exertion (Exercise)  12    Symptoms  none    Duration  Progress to 30 minutes of  aerobic without signs/symptoms of physical distress    Intensity  THRR unchanged        Progression          Progression  Continue to progress workloads to maintain intensity without signs/symptoms of physical distress.    Average METs  2.7        Resistance Training          Training Prescription  Yes    Weight  4lbs    Reps  10-15    Time  10 Minutes        Treadmill          MPH  1.8    Grade  1    Minutes  10    METs  2.63        Recumbant Bike          Level  2.5    Watts  10    Minutes  10    METs  2.4        NuStep          Level  5    SPM  90    Minutes  10    METs  3        Home Exercise Plan          Plans to continue exercise at  Home (comment)    Frequency  Add 2 additional days to program exercise sessions.    Initial Home Exercises Provided  01/05/18           Nutrition:  Target Goals: Understanding of nutrition guidelines, daily intake of sodium 1500mg , cholesterol 200mg , calories 30% from fat and 7% or less from saturated fats, daily to have 5 or more servings of fruits and vegetables.  Biometrics: Pre Biometrics - 12/19/17 1037    Pre Biometrics          Height  5' 6.25" (1.683 m)    Weight  208 lb 1.8 oz (94.4 kg)    Waist Circumference  44 inches    Hip Circumference  48.75 inches    Waist to Hip Ratio  0.9 %    BMI (Calculated)  33.33    Triceps Skinfold  55 mm    % Body Fat  48.6 %    Grip Strength  22 kg    Flexibility  11 in    Single Leg Stand  2 seconds            Nutrition Therapy Plan and Nutrition Goals: Nutrition Therapy & Goals - 12/19/17 1022    Nutrition Therapy          Diet  Heart Healthy        Personal Nutrition Goals          Nutrition Goal  Pt to identify food quantities necessary to achieve weight loss of 6-24 lb (2.7-10.9 kg) at graduation from cardiac rehab. Goal wt of 150 lb  desired.         Intervention Plan          Intervention  Prescribe, educate and counsel regarding  individualized specific dietary modifications aiming towards targeted core components such as weight, hypertension, lipid management, diabetes, heart failure and other comorbidities.    Expected Outcomes  Short Term Goal: Understand basic principles of dietary content, such as calories, fat, sodium, cholesterol and nutrients.;Long Term Goal: Adherence to prescribed nutrition plan.           Nutrition Assessments: Nutrition Assessments - 12/19/17 1021    MEDFICTS Scores          Pre Score  24           Nutrition Goals Re-Evaluation:   Nutrition Goals Re-Evaluation:   Nutrition Goals Discharge (Final Nutrition Goals Re-Evaluation):   Psychosocial: Target Goals: Acknowledge presence or absence of significant depression and/or stress, maximize coping skills, provide positive support system. Participant is able to verbalize types and ability to use techniques and skills needed for reducing stress and depression.  Initial Review & Psychosocial Screening: Initial Psych Review & Screening - 12/19/17 [place]?  Yes        Barriers          Psychosocial barriers to participate in program  There are no identifiable barriers or psychosocial needs.        Screening Interventions          Interventions  Encouraged to exercise    Expected Outcomes  Short Term goal: Utilizing psychosocial counselor, staff and physician to assist with identification of specific Stressors or current issues interfering with healing process. Setting desired goal for each stressor or current issue identified.;Long Term Goal: Stressors or current issues are controlled or eliminated.;Short Term goal: Identification and review with participant of any Quality of Life or Depression concerns found by scoring the questionnaire.;Long Term goal: The participant improves  quality of Life and PHQ9 Scores as seen by post scores and/or verbalization of changes           Quality of Life Scores: Quality of Life - 01/03/18 1445    Quality of Life Scores          Health/Function Pre  18.97 % c/o DOE concerned she may have extra fluid overload.  pt states she is not able to take lasix on exercise days.  pt encouraged to take lasix when she gets home from exercise. pt also c/o fatigue and low energy.  pt encouraged regular exercise will help.     Socioeconomic Pre  22.5 %    Psych/Spiritual Pre  20.21 %    Family Pre  22.8 %    GLOBAL Pre  20.62 % overal QOL scores reflective of recent cardiac event. pt offered counseling with Jeanella Craze. pt will let us know. pt offered emotional support and reassuranc.e           Scores of 19 and below usually indicate a poorer quality of life in these areas.  A difference of  2-3 points is a clinically meaningful difference.  A difference of 2-3 points in the total score of the Quality of Life Index has been associated with significant improvement in overall quality of life, self-image, physical symptoms, and general health in studies assessing change in quality of life.  PHQ-9: Recent Review Flowsheet Data    Depression screen [facility] 2/9 12/25/2017 10/18/2016 10/05/2016 08/05/2015 03/23/2015   Decreased Interest 1 0 0 0 0   Down, Depressed, Hopeless 1 0 0 0 0   PHQ - 2 Score 2  0 0 0 0   Altered sleeping 1 - - - -   Tired, decreased energy 1 - - - -   Change in appetite 0 - - - -   Feeling bad or failure about yourself  1 - - - -   Trouble concentrating 1 - - - -   Moving slowly or fidgety/restless 0 - - - -   Suicidal thoughts 0 - - - -   PHQ-9 Score 6 - - - -   Difficult doing work/chores Somewhat difficult - - - -     Interpretation of Total Score  Total Score Depression Severity:  1-4 = Minimal depression, 5-9 = Mild depression, 10-14 = Moderate depression, 15-19 = Moderately severe depression, 20-27 = Severe depression    Psychosocial Evaluation and Intervention: Psychosocial Evaluation - 01/01/18 1531    Psychosocial Evaluation & Interventions          Interventions  Encouraged to exercise with the program and follow exercise prescription;Stress management education;Relaxation education    Comments  pt with multiple stressors including sister battling cancer and her recent cardiac event.  pt enjoys spending time with her husband.      Expected Outcomes  pt will exhibit improved outlook with good coping skills.     Continue Psychosocial Services   Follow up required by counselor           Psychosocial Re-Evaluation: Psychosocial Re-Evaluation    Psychosocial Re-Evaluation    [place] Name 01/01/18 1532 01/31/18 1644   Current issues with  Current Stress Concerns;Current Anxiety/Panic  Current Stress Concerns;Current Anxiety/Panic   Comments  pt with health related anxiety and situational stress from sister with cancer. pt encouraged to meet with Jeanella Craze.  pt will let staff know if she is ready to schedule appointment.   pt with health related anxiety and situational stress from sister with cancer. pt exhibits improved outlook and coping skills.    Expected Outcomes  pt will exhibit improved outlook with good coping skills.   pt will exhibit improved outlook with good coping skills.    Interventions  Stress management education;Encouraged to attend Cardiac Rehabilitation for the exercise;Relaxation education  Stress management education;Encouraged to attend Cardiac Rehabilitation for the exercise;Relaxation education   Continue Psychosocial Services   Follow up required by staff  Follow up required by staff   Comments  pt offered Jeanella Craze counseling services.   pt offered Jeanella Craze counseling services.        Initial Review    Row Name 01/01/18 1532 01/31/18 1644   Source of Stress Concerns  Chronic Illness;Poor Coping Skills;Family  Chronic Illness;Poor Coping Skills;Family           Psychosocial Discharge (Final Psychosocial Re-Evaluation): Psychosocial Re-Evaluation - 01/31/18 1644    Psychosocial Re-Evaluation          Current issues with  Current Stress Concerns;Current Anxiety/Panic    Comments  pt with health related anxiety and situational stress from sister with cancer. pt exhibits improved outlook and coping skills.     Expected Outcomes  pt will exhibit improved outlook with good coping skills.     Interventions  Stress management education;Encouraged to attend Cardiac Rehabilitation for the exercise;Relaxation education    Continue Psychosocial Services   Follow up required by staff    Comments  pt offered Jeanella Craze counseling services.         Initial Review          Source of Stress  Concerns  Chronic Illness;Poor Coping Skills;Family           Vocational Rehabilitation: Provide vocational rehab assistance to qualifying candidates.   Vocational Rehab Evaluation & Intervention: Vocational Rehab - 12/19/17 1124    Initial Vocational Rehab Evaluation & Intervention          Assessment shows need for Vocational Rehabilitation  No Catricia is retired and does not need vocational rehab at this time           Education: Education Goals: Education classes will be provided on a weekly basis, covering required topics. Participant will state understanding/return demonstration of topics presented.  Learning Barriers/Preferences: Learning Barriers/Preferences - 12/19/17 0910    Learning Barriers/Preferences          Learning Barriers  Sight    Learning Preferences  Video;Pictoral           Education Topics: Count Your Pulse:  -Group instruction provided by verbal instruction, demonstration, patient participation and written materials to support subject.  Instructors address importance of being able to find your pulse and how to count your pulse when at home without a heart monitor.  Patients get hands on experience counting their pulse  with staff help and individually. Flowsheet Row CARDIAC REHAB PHASE II EXERCISE from 01/31/2018 in [place]  Date  01/26/18  Educator  RN  Instruction Review Code  2- Demonstrated Understanding      Heart Attack, Angina, and Risk Factor Modification:  -Group instruction provided by verbal instruction, video, and written materials to support subject.  Instructors address signs and symptoms of angina and heart attacks.    Also discuss risk factors for heart disease and how to make changes to improve heart health risk factors.   Functional Fitness:  -Group instruction provided by verbal instruction, demonstration, patient participation, and written materials to support subject.  Instructors address safety measures for doing things around the house.  Discuss how to get up and down off the floor, how to pick things up properly, how to safely get out of a chair without assistance, and balance training. Flowsheet Row CARDIAC REHAB PHASE II EXERCISE from 01/31/2018 in [place]  Date  12/29/17  Educator  EP  Instruction Review Code  2- Demonstrated Understanding      Meditation and Mindfulness:  -Group instruction provided by verbal instruction, patient participation, and written materials to support subject.  Instructor addresses importance of mindfulness and meditation practice to help reduce stress and improve awareness.  Instructor also leads participants through a meditation exercise.  Flowsheet Row CARDIAC REHAB PHASE II EXERCISE from 01/31/2018 in [place]  Date  01/03/18  Instruction Review Code  2- Demonstrated Understanding      Stretching for Flexibility and Mobility:  -Group instruction provided by verbal instruction, patient participation, and written materials to support subject.  Instructors lead participants through series of stretches that are designed to increase flexibility thus  improving mobility.  These stretches are additional exercise for major muscle groups that are typically performed during regular warm up and cool down.   Hands Only CPR:  -Group verbal, video, and participation provides a basic overview of AHA guidelines for community CPR. Role-play of emergencies allow participants the opportunity to practice calling for help and chest compression technique with discussion of AED use.   Hypertension: -Group verbal and written instruction that provides a basic overview of hypertension including the most recent diagnostic guidelines, risk factor  reduction with self-care instructions and medication management. Flowsheet Row CARDIAC REHAB PHASE II EXERCISE from 01/31/2018 in [place]  Date  01/12/18  Educator  RN  Instruction Review Code  2- Demonstrated Understanding       Nutrition I class: Heart Healthy Eating:  -Group instruction provided by PowerPoint slides, verbal discussion, and written materials to support subject matter. The instructor gives an explanation and review of the Therapeutic Lifestyle Changes diet recommendations, which includes a discussion on lipid goals, dietary fat, sodium, fiber, plant stanol/sterol esters, sugar, and the components of a well-balanced, healthy diet.   Nutrition II class: Lifestyle Skills:  -Group instruction provided by PowerPoint slides, verbal discussion, and written materials to support subject matter. The instructor gives an explanation and review of label reading, grocery shopping for heart health, heart healthy recipe modifications, and ways to make healthier choices when eating out.   Diabetes Question & Answer:  -Group instruction provided by PowerPoint slides, verbal discussion, and written materials to support subject matter. The instructor gives an explanation and review of diabetes co-morbidities, pre- and post-prandial blood glucose goals, pre-exercise blood glucose goals,  signs, symptoms, and treatment of hypoglycemia and hyperglycemia, and foot care basics. Flowsheet Row CARDIAC REHAB PHASE II EXERCISE from 01/31/2018 in [place]  Date  01/19/18  Educator  RD  Instruction Review Code  2- Demonstrated Understanding      Diabetes Blitz:  -Group instruction provided by PowerPoint slides, verbal discussion, and written materials to support subject matter. The instructor gives an explanation and review of the physiology behind type 1 and type 2 diabetes, diabetes medications and rational behind using different medications, pre- and post-prandial blood glucose recommendations and Hemoglobin A1c goals, diabetes diet, and exercise including blood glucose guidelines for exercising safely.    Portion Distortion:  -Group instruction provided by PowerPoint slides, verbal discussion, written materials, and food models to support subject matter. The instructor gives an explanation of serving size versus portion size, changes in portions sizes over the last 20 years, and what consists of a serving from each food group. Flowsheet Row CARDIAC REHAB PHASE II EXERCISE from 01/31/2018 in [place]  Date  01/10/18  Educator  RD  Instruction Review Code  2- Demonstrated Understanding      Stress Management:  -Group instruction provided by verbal instruction, video, and written materials to support subject matter.  Instructors review role of stress in heart disease and how to cope with stress positively.   Flowsheet Row CARDIAC REHAB PHASE II EXERCISE from 01/31/2018 in [place]  Date  01/17/18  Educator  RN  Instruction Review Code  2- Demonstrated Understanding      Exercising on Your Own:  -Group instruction provided by verbal instruction, power point, and written materials to support subject.  Instructors discuss benefits of exercise, components of exercise, frequency and  intensity of exercise, and end points for exercise.  Also discuss use of nitroglycerin and activating EMS.  Review options of places to exercise outside of rehab.  Review guidelines for sex with heart disease.   Cardiac Drugs I:  -Group instruction provided by verbal instruction and written materials to support subject.  Instructor reviews cardiac drug classes: antiplatelets, anticoagulants, beta blockers, and statins.  Instructor discusses reasons, side effects, and lifestyle considerations for each drug class. Flowsheet Row CARDIAC REHAB PHASE II EXERCISE from 01/31/2018 in [place]  Date  12/27/17  Instruction Review Code  2- Demonstrated Understanding      Cardiac Drugs II:  -Group instruction provided by verbal instruction and written materials to support subject.  Instructor reviews cardiac drug classes: angiotensin converting enzyme inhibitors (ACE-I), angiotensin II receptor blockers (ARBs), nitrates, and calcium channel blockers.  Instructor discusses reasons, side effects, and lifestyle considerations for each drug class. Flowsheet Row CARDIAC REHAB PHASE II EXERCISE from 01/31/2018 in [place]  Date  01/24/18  Educator  -- [pharmacist]  Instruction Review Code  2- Demonstrated Understanding      Anatomy and Physiology of the Circulatory System:  Group verbal and written instruction and models provide basic cardiac anatomy and physiology, with the coronary electrical and arterial systems. Review of: AMI, Angina, Valve disease, Heart Failure, Peripheral Artery Disease, Cardiac Arrhythmia, Pacemakers, and the ICD. Flowsheet Row CARDIAC REHAB PHASE II EXERCISE from 01/31/2018 in [place]  Date  01/31/18  Instruction Review Code  2- Demonstrated Understanding      Other Education:  -Group or individual verbal, written, or video instructions that support the educational goals of the  cardiac rehab program.   Holiday Eating Survival Tips:  -Group instruction provided by PowerPoint slides, verbal discussion, and written materials to support subject matter. The instructor gives patients tips, tricks, and techniques to help them not only survive but enjoy the holidays despite the onslaught of food that accompanies the holidays.   Knowledge Questionnaire Score: Knowledge Questionnaire Score - 12/19/17 1031    Knowledge Questionnaire Score          Pre Score  22/24           Core Components/Risk Factors/Patient Goals at Admission: Personal Goals and Risk Factors at Admission - 12/19/17 1040    Core Components/Risk Factors/Patient Goals on Admission           Weight Management  Yes;Obesity;Weight Maintenance;Weight Loss    Intervention  Weight Management: Develop a combined nutrition and exercise program designed to reach desired caloric intake, while maintaining appropriate intake of nutrient and fiber, sodium and fats, and appropriate energy expenditure required for the weight goal.;Weight Management: Provide education and appropriate resources to help participant work on and attain dietary goals.;Weight Management/Obesity: Establish reasonable short term and long term weight goals.;Obesity: Provide education and appropriate resources to help participant work on and attain dietary goals.    Admit Weight  208 lb 1.8 oz (94.4 kg)    Goal Weight: Short Term  200 lb (90.7 kg)    Goal Weight: Long Term  190 lb (86.2 kg)    Expected Outcomes  Short Term: Continue to assess and modify interventions until short term weight is achieved;Long Term: Adherence to nutrition and physical activity/exercise program aimed toward attainment of established weight goal;Weight Maintenance: Understanding of the daily nutrition guidelines, which includes 25-35% calories from fat, 7% or less cal from saturated fats, less than 200mg  cholesterol, less than 1.5gm of sodium, & 5 or more servings of  fruits and vegetables daily;Weight Loss: Understanding of general recommendations for a balanced deficit meal plan, which promotes 1-2 lb weight loss per week and includes a negative energy balance of [phone] kcal/d;Understanding recommendations for meals to include 15-35% energy as protein, 25-35% energy from fat, 35-60% energy from carbohydrates, less than 200mg  of dietary cholesterol, 20-35 gm of total fiber daily;Understanding of distribution of calorie intake throughout the day with the consumption of 4-5 meals/snacks    Improve shortness of breath  with ADL's  Yes    Intervention  Provide education, individualized exercise plan and daily activity instruction to help decrease symptoms of SOB with activities of daily living.    Expected Outcomes  Short Term: Improve cardiorespiratory fitness to achieve a reduction of symptoms when performing ADLs;Long Term: Be able to perform more ADLs without symptoms or delay the onset of symptoms    Heart Failure  Yes    Intervention  Provide a combined exercise and nutrition program that is supplemented with education, support and counseling about heart failure. Directed toward relieving symptoms such as shortness of breath, decreased exercise tolerance, and extremity edema.    Expected Outcomes  Improve functional capacity of life;Long term: Adoption of self-care skills and reduction of barriers for early signs and symptoms recognition and intervention leading to self-care maintenance.;Short term: Daily weights obtained and reported for increase. Utilizing diuretic protocols set by physician.;Short term: Attendance in program 2-3 days a week with increased exercise capacity. Reported lower sodium intake. Reported increased fruit and vegetable intake. Reports medication compliance.    Hypertension  Yes    Intervention  Provide education on lifestyle modifcations including regular physical activity/exercise, weight management, moderate sodium restriction and increased  consumption of fresh fruit, vegetables, and low fat dairy, alcohol moderation, and smoking cessation.;Monitor prescription use compliance.    Expected Outcomes  Short Term: Continued assessment and intervention until BP is < 140/70mm HG in hypertensive participants. < 130/31mm HG in hypertensive participants with diabetes, heart failure or chronic kidney disease.;Long Term: Maintenance of blood pressure at goal levels.    Lipids  Yes    Intervention  Provide education and support for participant on nutrition & aerobic/resistive exercise along with prescribed medications to achieve LDL 70mg , HDL >40mg .    Expected Outcomes  Short Term: Participant states understanding of desired cholesterol values and is compliant with medications prescribed. Participant is following exercise prescription and nutrition guidelines.;Long Term: Cholesterol controlled with medications as prescribed, with individualized exercise RX and with personalized nutrition plan. Value goals: LDL < 70mg , HDL > 40 mg.    Stress  Yes    Intervention  Offer individual and/or small group education and counseling on adjustment to heart disease, stress management and health-related lifestyle change. Teach and support self-help strategies.;Refer participants experiencing significant psychosocial distress to appropriate mental health specialists for further evaluation and treatment. When possible, include family members and significant others in education/counseling sessions.    Expected Outcomes  Short Term: Participant demonstrates changes in health-related behavior, relaxation and other stress management skills, ability to obtain effective social support, and compliance with psychotropic medications if prescribed.;Long Term: Emotional wellbeing is indicated by absence of clinically significant psychosocial distress or social isolation.           Core Components/Risk Factors/Patient Goals Review:  Goals and Risk Factor Review    Core  Components/Risk Factors/Patient Goals Review    Row Name 12/25/17 0941 01/01/18 1531 01/31/18 1643   Personal Goals Review  Weight Management/Obesity;Improve shortness of breath with ADL's;Heart Failure;Hypertension;Stress;Lipids  Weight Management/Obesity;Improve shortness of breath with ADL's;Heart Failure;Hypertension;Stress;Lipids  Weight Management/Obesity;Improve shortness of breath with ADL's;Heart Failure;Hypertension;Stress;Lipids   Review  pt with multiple CAD RF demostrates eagerness to participate in CR program.  pt personal goals are to improve dypsnea and improve balance.   pt with multiple CAD RF demostrates eagerness to participate in CR program.  pt personal goals are to improve dypsnea and improve balance.   pt with multiple CAD RF demostrates eagerness to participate in  CR program.  pt personal goals are to improve dypsnea and improve balance. pt reports she is able to walk from [facility] without rest break.     Expected Outcomes  pt will participate in CR exercise, nutrition and lifestyle modifications to decrease overall RF.   pt will participate in CR exercise, nutrition and lifestyle modifications to decrease overall RF.   pt will participate in CR exercise, nutrition and lifestyle modifications to decrease overall RF.           Core Components/Risk Factors/Patient Goals at Discharge (Final Review):  Goals and Risk Factor Review - 01/31/18 1643    Core Components/Risk Factors/Patient Goals Review          Personal Goals Review  Weight Management/Obesity;Improve shortness of breath with ADL's;Heart Failure;Hypertension;Stress;Lipids    Review  pt with multiple CAD RF demostrates eagerness to participate in CR program.  pt personal goals are to improve dypsnea and improve balance. pt reports she is able to walk from [facility] without rest break.      Expected Outcomes  pt will participate in CR exercise, nutrition and lifestyle modifications to decrease overall RF.             ITP Comments: ITP Comments    Row Name 12/19/17 2694 12/25/17 0915 01/01/18 1531 01/31/18 1643   ITP Comments  Medical Director, Dr. Fransico Him, MD  pt started group exercise.  pt demonstrates eagerness to participate in CR program.   30day ITP review. pt with good attendance and participation.   30day ITP review. pt with good attendance and participation.       Comments:  Andi Hence, RN, BSN Cardiac Pulmonary Rehab 02/01/18 2:19 PM

## 2018-02-02 ENCOUNTER — Encounter (HOSPITAL_COMMUNITY)
Admission: RE | Admit: 2018-02-02 | Discharge: 2018-02-02 | Disposition: A | Discharge status: Home / Self Care | Payer: Medicare Other | Source: Ambulatory Visit | Attending: Cardiovascular Disease | Admitting: Cardiovascular Disease

## 2018-02-02 DIAGNOSIS — Z79899 Other long term (current) drug therapy: Secondary | ICD-10-CM | POA: Diagnosis not present

## 2018-02-02 DIAGNOSIS — Z955 Presence of coronary angioplasty implant and graft: Secondary | ICD-10-CM

## 2018-02-02 DIAGNOSIS — I110 Hypertensive heart disease with heart failure: Secondary | ICD-10-CM | POA: Diagnosis not present

## 2018-02-02 DIAGNOSIS — Z87891 Personal history of nicotine dependence: Secondary | ICD-10-CM | POA: Diagnosis not present

## 2018-02-02 DIAGNOSIS — E785 Hyperlipidemia, unspecified: Secondary | ICD-10-CM | POA: Diagnosis not present

## 2018-02-02 DIAGNOSIS — I509 Heart failure, unspecified: Secondary | ICD-10-CM | POA: Diagnosis not present

## 2018-02-05 ENCOUNTER — Encounter (HOSPITAL_COMMUNITY): Payer: Medicare Other

## 2018-02-05 ENCOUNTER — Telehealth (HOSPITAL_COMMUNITY): Payer: Self-pay | Admitting: Internal Medicine

## 2018-02-07 ENCOUNTER — Encounter (HOSPITAL_COMMUNITY)
Admission: RE | Admit: 2018-02-07 | Discharge: 2018-02-07 | Disposition: A | Discharge status: Home / Self Care | Payer: Medicare Other | Source: Ambulatory Visit | Attending: Cardiovascular Disease | Admitting: Cardiovascular Disease

## 2018-02-07 DIAGNOSIS — I509 Heart failure, unspecified: Secondary | ICD-10-CM | POA: Diagnosis not present

## 2018-02-07 DIAGNOSIS — E785 Hyperlipidemia, unspecified: Secondary | ICD-10-CM | POA: Diagnosis not present

## 2018-02-07 DIAGNOSIS — Z87891 Personal history of nicotine dependence: Secondary | ICD-10-CM | POA: Diagnosis not present

## 2018-02-07 DIAGNOSIS — Z79899 Other long term (current) drug therapy: Secondary | ICD-10-CM | POA: Diagnosis not present

## 2018-02-07 DIAGNOSIS — Z955 Presence of coronary angioplasty implant and graft: Secondary | ICD-10-CM | POA: Diagnosis not present

## 2018-02-07 DIAGNOSIS — I110 Hypertensive heart disease with heart failure: Secondary | ICD-10-CM | POA: Diagnosis not present

## 2018-02-08 ENCOUNTER — Encounter: Payer: Self-pay | Admitting: Internal Medicine

## 2018-02-08 ENCOUNTER — Ambulatory Visit (INDEPENDENT_AMBULATORY_CARE_PROVIDER_SITE_OTHER): Payer: Medicare Other | Admitting: Internal Medicine

## 2018-02-08 VITALS — BP 122/76 | HR 92 | Temp 99.0°F | Wt 210.2 lb

## 2018-02-08 DIAGNOSIS — R0600 Dyspnea, unspecified: Secondary | ICD-10-CM

## 2018-02-08 DIAGNOSIS — R0609 Other forms of dyspnea: Secondary | ICD-10-CM | POA: Diagnosis not present

## 2018-02-08 DIAGNOSIS — Z87891 Personal history of nicotine dependence: Secondary | ICD-10-CM | POA: Diagnosis not present

## 2018-02-08 DIAGNOSIS — J22 Unspecified acute lower respiratory infection: Secondary | ICD-10-CM

## 2018-02-08 DIAGNOSIS — I252 Old myocardial infarction: Secondary | ICD-10-CM | POA: Diagnosis not present

## 2018-02-08 DIAGNOSIS — I2510 Atherosclerotic heart disease of native coronary artery without angina pectoris: Secondary | ICD-10-CM

## 2018-02-08 MED ORDER — DOXYCYCLINE HYCLATE 100 MG PO TABS: 100.0000 mg | ORAL_TABLET | Freq: Two times a day (BID) | ORAL | 0 refills | Status: DC

## 2018-02-08 NOTE — Progress Notes (Signed)
Chief Complaint  Patient presents with  . Cough    x4 days, sore throat, coughing up green/yellow mucus, head and chest congestion, low fever, body aches, no chills, no night sweats,     HPI: Tracy [name] 72 y.o.  DA PCP NA today   S/p cas placement jan and in cardiac rehab and has baseline sob from  dchf  Here with  Husband today   Over easter weekend    Martin Majestic to daughters   house  Exposed to grand children one with a cough  And then  Had st  And didn't go to card rehab 3 days ago .   Bad sore throat    And gargles  .    Then cough  Soup and  And low grade 99 fever   No worse sob    Face mild pressure and in ears  No chest pain    Wonders if could have  Copd causeing her baseline  Sob  ROS: See pertinent positives and negatives per HPI. No hemoptysis  Hx fluid in lungs   holdnig  diuretic  Some days to prevent uirinarion at her rehab  Past Medical History:  Diagnosis Date  . Allergic rhinitis   . CAD (coronary artery disease)    1/19 PCI/DES to [place] for ISR, normal EF.   Marland Kitchen CHF (congestive heart failure) ([place])   . Gout   . Headache(784.0)   . Hyperlipidemia   . Hypertension   . Low back pain   . Menopausal syndrome   . Myocardial infarct ([place]) 2007   hx of  . Overactive bladder     Family History  Adopted: Yes  Problem Relation Age of Onset  . Colon cancer Mother 72  . Other Unknown        patient is adopted  . Stomach cancer Neg Hx     Social History   Socioeconomic History  . Marital status: Married    Spouse name: Not on file  . Number of children: Not on file  . Years of education: Not on file  . Highest education level: Not on file  Occupational History  . Occupation: direct Civil engineer, contracting  Social Needs  . Financial resource strain: Not on file  . Food insecurity:    Worry: Not on file    Inability: Not on file  . Transportation needs:    Medical: No    Non-medical: No  Tobacco Use  . Smoking status: Former Smoker    Last attempt  to quit: 07/01/2012    Years since quitting: 5.6  . Smokeless tobacco: Never Used  Substance and Sexual Activity  . Alcohol use: No  . Drug use: No  . Sexual activity: Not on file  Lifestyle  . Physical activity:    Days per week: 0 days    Minutes per session: 0 min  . Stress: Only a little  Relationships  . Social connections:    Talks on phone: Not on file    Gets together: Not on file    Attends religious service: Not on file    Active member of club or organization: Not on file    Attends meetings of clubs or organizations: Not on file    Relationship status: Not on file  Other Topics Concern  . Not on file  Social History Narrative  . Not on file    Outpatient Medications Prior to Visit  Medication Sig Dispense Refill  . allopurinol (  ZYLOPRIM) 100 MG tablet Take 1 tablet (100 mg total) by mouth daily. 90 tablet 0  . ALPRAZolam (XANAX) 0.25 MG tablet TAKE 1 TABLET BY MOUTH TWICE A DAY AS NEEDED FOR ANXIETY 60 tablet 5  . aspirin EC 81 MG tablet Take 81 mg by mouth daily.    Marland Kitchen atorvastatin (LIPITOR) 40 MG tablet TAKE 1 TABLET DAILY BY MOUTH. PLEASE KEEP UPCOMING APPT FOR FUTURE REFILLS. THANKS 90 tablet 3  . calcium carbonate (CALCIUM 600) 600 MG TABS tablet Take 600 mg by mouth See admin instructions. Once every 2 weeks    . cetirizine (ZYRTEC) 10 MG tablet Take 10 mg by mouth daily.    . Cholecalciferol (VITAMIN D3 PO) Take 1,000 Units by mouth daily.    . clopidogrel (PLAVIX) 75 MG tablet Take 1 tablet (75 mg total) by mouth daily with breakfast. 90 tablet 2  . Coenzyme Q10 (CO Q 10 PO) Take 300 mg by mouth See admin instructions. Once every 2 weeks.    . Cyanocobalamin (VITAMIN B-12) 5000 MCG SUBL Place 5,000 mcg under the tongue daily.    . furosemide (LASIX) 40 MG tablet Take 1 tablet (40 mg total) by mouth daily. 90 tablet 3  . losartan (COZAAR) 100 MG tablet TAKE 1 TABLET (100 MG TOTAL) BY MOUTH DAILY. 90 tablet 3  . metoprolol tartrate (LOPRESSOR) 100 MG tablet  TAKE 1 TABLET (100 MG TOTAL) BY MOUTH 2 (TWO) TIMES DAILY. 180 tablet 1  . Misc Natural Products (TART CHERRY ADVANCED) CAPS Take 1,000 capsules by mouth daily.     . nitroGLYCERIN (NITROSTAT) 0.4 MG SL tablet Place 1 tablet (0.4 mg total) under the tongue every 5 (five) minutes as needed for chest pain. 25 tablet 3  . Omega-3 Fatty Acids (FISH OIL OMEGA-3 PO) Take 1,400 mg by mouth See admin instructions. Once every couple weeks    . pantoprazole (PROTONIX) 40 MG tablet TAKE 1 TABLET (40 MG TOTAL) BY MOUTH DAILY. 90 tablet 1  . potassium chloride (K-DUR) 10 MEQ tablet Take 1 tablet (10 mEq total) by mouth daily. 90 tablet 3  . vitamin E 200 UNIT capsule Take 200 Units by mouth daily.     No facility-administered medications prior to visit.      EXAM:  BP 122/76 (BP Location: Left Arm, Patient Position: Sitting, Cuff Size: Large)   Pulse 92   Temp 99 F (37.2 C) (Oral)   Wt 210 lb 3.2 oz (95.3 kg)   SpO2 94%   BMI 34.98 kg/m   Body mass index is 34.98 kg/m. WDWN in NAD  quiet respirations; mildly congested  somewhat hoarse. Non toxic . No dyspnea at rest  HEENT: Normocephalic ;atraumatic , Eyes;  PERRL, EOMs  Full, lids and conjunctiva clear,,Ears: no deformities, canals nl, TM landmarks normal, Nose: no deformity or discharge but congested;face minimally tender Mouth : OP clear without lesion or edema . Mild redness  Neck: Supple without adenopathy or masses or bruits Chest:  Clear to awithout wheezes rales or rhonchi  Bases  ? Less aeration related to effort  CV:  S1-S2 no gallops or murmurs peripheral perfusion is normal min edema  Skin :nl perfusion and no acute rashes  PSYCH: pleasant and cooperative, no obvious depression or anxiety  ASSESSMENT AND PLAN:  Discussed the following assessment and plan:  Acute respiratory infection  MYOCARDIAL INFARCTION, HX OF  Coronary artery disease involving native coronary artery of native heart without angina pectoris  DOE (dyspnea  on exertion)  History of tobacco use Acts like viral rest infect   bronchjitis sinus       Expectant management. Printed antibiotic for over weekens cause she is high risk  Can add if  Pain   Worsening  Dc  But if fever sob seek emergent care . No alarm features today   Disc poss of pfts and eval for lung function  And other causes of exercise sob besides her cv situation .she is  An ex smoker  Make  appt PCP for about 2 weeks to discuss plan  -Patient advised to return or notify health care team  if symptoms worsen ,persist or new concerns arise.  Patient Instructions  This acts like a  Viral bronchitis   And may take  A week or so to be all better   If getting high fever  More short of breath then see fu care.   If  More infected .    Mucous could add antibiotic  But would wait at this time  Rest fluids mucin ex plain .    Make fu appt with Dr Raliegh Ip   About the  breathing issues   .     And  Poss evaluation  Lung functions    ? rx for copd  Continue tobacco free !     Standley Brooking. Yemaya Barnier M.D.

## 2018-02-08 NOTE — Patient Instructions (Addendum)
This acts like a  Viral bronchitis   And may take  A week or so to be all better   If getting high fever  More short of breath then see fu care.   If  More infected .    Mucous could add antibiotic  But would wait at this time  Rest fluids mucin ex plain .    Make fu appt with Dr Raliegh Ip   About the  breathing issues   .     And  Poss evaluation  Lung functions    ? rx for copd  Continue tobacco free !

## 2018-02-09 ENCOUNTER — Encounter (HOSPITAL_COMMUNITY): Payer: Medicare Other

## 2018-02-12 ENCOUNTER — Encounter (HOSPITAL_COMMUNITY)
Admission: RE | Admit: 2018-02-12 | Discharge: 2018-02-12 | Disposition: A | Discharge status: Home / Self Care | Payer: Medicare Other | Source: Ambulatory Visit | Attending: Cardiovascular Disease | Admitting: Cardiovascular Disease

## 2018-02-12 DIAGNOSIS — Z87891 Personal history of nicotine dependence: Secondary | ICD-10-CM | POA: Diagnosis not present

## 2018-02-12 DIAGNOSIS — Z955 Presence of coronary angioplasty implant and graft: Secondary | ICD-10-CM | POA: Diagnosis not present

## 2018-02-12 DIAGNOSIS — I509 Heart failure, unspecified: Secondary | ICD-10-CM | POA: Diagnosis not present

## 2018-02-12 DIAGNOSIS — Z79899 Other long term (current) drug therapy: Secondary | ICD-10-CM | POA: Diagnosis not present

## 2018-02-12 DIAGNOSIS — I110 Hypertensive heart disease with heart failure: Secondary | ICD-10-CM | POA: Diagnosis not present

## 2018-02-12 DIAGNOSIS — E785 Hyperlipidemia, unspecified: Secondary | ICD-10-CM | POA: Diagnosis not present

## 2018-02-14 ENCOUNTER — Encounter (HOSPITAL_COMMUNITY)
Admission: RE | Admit: 2018-02-14 | Discharge: 2018-02-14 | Disposition: A | Discharge status: Home / Self Care | Payer: Medicare Other | Source: Ambulatory Visit | Attending: Cardiovascular Disease | Admitting: Cardiovascular Disease

## 2018-02-14 DIAGNOSIS — I110 Hypertensive heart disease with heart failure: Secondary | ICD-10-CM | POA: Insufficient documentation

## 2018-02-14 DIAGNOSIS — E785 Hyperlipidemia, unspecified: Secondary | ICD-10-CM | POA: Diagnosis not present

## 2018-02-14 DIAGNOSIS — Z87891 Personal history of nicotine dependence: Secondary | ICD-10-CM | POA: Diagnosis not present

## 2018-02-14 DIAGNOSIS — Z79899 Other long term (current) drug therapy: Secondary | ICD-10-CM | POA: Diagnosis not present

## 2018-02-14 DIAGNOSIS — I509 Heart failure, unspecified: Secondary | ICD-10-CM | POA: Diagnosis not present

## 2018-02-14 DIAGNOSIS — Z955 Presence of coronary angioplasty implant and graft: Secondary | ICD-10-CM | POA: Diagnosis not present

## 2018-02-16 ENCOUNTER — Encounter (HOSPITAL_COMMUNITY)
Admission: RE | Admit: 2018-02-16 | Discharge: 2018-02-16 | Disposition: A | Discharge status: Home / Self Care | Payer: Medicare Other | Source: Ambulatory Visit | Attending: Cardiovascular Disease | Admitting: Cardiovascular Disease

## 2018-02-16 DIAGNOSIS — I509 Heart failure, unspecified: Secondary | ICD-10-CM | POA: Diagnosis not present

## 2018-02-16 DIAGNOSIS — Z955 Presence of coronary angioplasty implant and graft: Secondary | ICD-10-CM | POA: Diagnosis not present

## 2018-02-16 DIAGNOSIS — Z79899 Other long term (current) drug therapy: Secondary | ICD-10-CM | POA: Diagnosis not present

## 2018-02-16 DIAGNOSIS — I110 Hypertensive heart disease with heart failure: Secondary | ICD-10-CM | POA: Diagnosis not present

## 2018-02-16 DIAGNOSIS — Z87891 Personal history of nicotine dependence: Secondary | ICD-10-CM | POA: Diagnosis not present

## 2018-02-16 DIAGNOSIS — E785 Hyperlipidemia, unspecified: Secondary | ICD-10-CM | POA: Diagnosis not present

## 2018-02-19 ENCOUNTER — Encounter (HOSPITAL_COMMUNITY)
Admission: RE | Admit: 2018-02-19 | Discharge: 2018-02-19 | Disposition: A | Discharge status: Home / Self Care | Payer: Medicare Other | Source: Ambulatory Visit | Attending: Cardiovascular Disease | Admitting: Cardiovascular Disease

## 2018-02-19 DIAGNOSIS — Z79899 Other long term (current) drug therapy: Secondary | ICD-10-CM | POA: Diagnosis not present

## 2018-02-19 DIAGNOSIS — Z955 Presence of coronary angioplasty implant and graft: Secondary | ICD-10-CM

## 2018-02-19 DIAGNOSIS — E785 Hyperlipidemia, unspecified: Secondary | ICD-10-CM | POA: Diagnosis not present

## 2018-02-19 DIAGNOSIS — Z87891 Personal history of nicotine dependence: Secondary | ICD-10-CM | POA: Diagnosis not present

## 2018-02-19 DIAGNOSIS — I509 Heart failure, unspecified: Secondary | ICD-10-CM | POA: Diagnosis not present

## 2018-02-19 DIAGNOSIS — I110 Hypertensive heart disease with heart failure: Secondary | ICD-10-CM | POA: Diagnosis not present

## 2018-02-21 ENCOUNTER — Encounter (HOSPITAL_COMMUNITY)
Admission: RE | Admit: 2018-02-21 | Discharge: 2018-02-21 | Disposition: A | Discharge status: Home / Self Care | Payer: Medicare Other | Source: Ambulatory Visit | Attending: Cardiovascular Disease | Admitting: Cardiovascular Disease

## 2018-02-21 DIAGNOSIS — E785 Hyperlipidemia, unspecified: Secondary | ICD-10-CM | POA: Diagnosis not present

## 2018-02-21 DIAGNOSIS — I110 Hypertensive heart disease with heart failure: Secondary | ICD-10-CM | POA: Diagnosis not present

## 2018-02-21 DIAGNOSIS — Z955 Presence of coronary angioplasty implant and graft: Secondary | ICD-10-CM

## 2018-02-21 DIAGNOSIS — I509 Heart failure, unspecified: Secondary | ICD-10-CM | POA: Diagnosis not present

## 2018-02-21 DIAGNOSIS — Z79899 Other long term (current) drug therapy: Secondary | ICD-10-CM | POA: Diagnosis not present

## 2018-02-21 DIAGNOSIS — Z87891 Personal history of nicotine dependence: Secondary | ICD-10-CM | POA: Diagnosis not present

## 2018-02-22 ENCOUNTER — Encounter: Payer: Self-pay | Admitting: Internal Medicine

## 2018-02-22 ENCOUNTER — Ambulatory Visit (INDEPENDENT_AMBULATORY_CARE_PROVIDER_SITE_OTHER): Payer: Medicare Other | Admitting: Internal Medicine

## 2018-02-22 VITALS — BP 118/60 | HR 85 | Temp 98.6°F | Wt 205.0 lb

## 2018-02-22 DIAGNOSIS — R0600 Dyspnea, unspecified: Secondary | ICD-10-CM

## 2018-02-22 DIAGNOSIS — I2510 Atherosclerotic heart disease of native coronary artery without angina pectoris: Secondary | ICD-10-CM | POA: Diagnosis not present

## 2018-02-22 DIAGNOSIS — G8929 Other chronic pain: Secondary | ICD-10-CM | POA: Diagnosis not present

## 2018-02-22 DIAGNOSIS — M545 Low back pain, unspecified: Secondary | ICD-10-CM

## 2018-02-22 DIAGNOSIS — I5022 Chronic systolic (congestive) heart failure: Secondary | ICD-10-CM | POA: Diagnosis not present

## 2018-02-22 DIAGNOSIS — I10 Essential (primary) hypertension: Secondary | ICD-10-CM | POA: Diagnosis not present

## 2018-02-22 DIAGNOSIS — R0609 Other forms of dyspnea: Secondary | ICD-10-CM

## 2018-02-22 MED ORDER — HYDROCODONE-ACETAMINOPHEN 5-325 MG PO TABS: 1.0000 | ORAL_TABLET | Freq: Four times a day (QID) | ORAL | 0 refills | Status: AC | PRN

## 2018-02-22 NOTE — Patient Instructions (Signed)
Pulmonary function studies as discussed  Continue cardiac rehab and efforts at weight loss  Limit your sodium (Salt) intake

## 2018-02-22 NOTE — Progress Notes (Signed)
Subjective:    Patient ID: Tracy [name], female    DOB: January 12, 1946, 72 y.o.   MRN: [id number]  HPI 72 year old patient who is seen today in follow-up. The patient has coronary artery disease and initially underwent stenting of the left circumflex with a bare-metal stent in 2007.  She then underwent repeat stenting of the circumflex with a drug-eluting stent platform in January 2019 when she developed recurrent exertional angina and a stress test demonstrating inferolateral ischemia. She has been participating with cardiac rehab and has been bothered by DOE as well as fatigue.  She discontinued smoking about 1 year ago but prior to this was an intermittent smoker for a number of years.  Presently she is recovering from a URI.  She does describe some occasional wheezing in the setting of URIs only.  She does have a history of chronic diastolic heart failure and essential hypertension.  She remains on statin drugs.  She is requesting a refill on hydrocodone which she takes sparingly for back and other arthritic pain  Past Medical History:  Diagnosis Date  . Allergic rhinitis   . CAD (coronary artery disease)    1/19 PCI/DES to [place] for ISR, normal EF.   Marland Kitchen CHF (congestive heart failure) ([place])   . Gout   . Headache(784.0)   . Hyperlipidemia   . Hypertension   . Low back pain   . Menopausal syndrome   . Myocardial infarct ([place]) 2007   hx of  . Overactive bladder      Social History   Socioeconomic History  . Marital status: Married    Spouse name: Not on file  . Number of children: Not on file  . Years of education: Not on file  . Highest education level: Not on file  Occupational History  . Occupation: direct Civil engineer, contracting  Social Needs  . Financial resource strain: Not on file  . Food insecurity:    Worry: Not on file    Inability: Not on file  . Transportation needs:    Medical: No    Non-medical: No  Tobacco Use  . Smoking status: Former Smoker   Last attempt to quit: 07/01/2012    Years since quitting: 5.6  . Smokeless tobacco: Never Used  Substance and Sexual Activity  . Alcohol use: No  . Drug use: No  . Sexual activity: Not on file  Lifestyle  . Physical activity:    Days per week: 0 days    Minutes per session: 0 min  . Stress: Only a little  Relationships  . Social connections:    Talks on phone: Not on file    Gets together: Not on file    Attends religious service: Not on file    Active member of club or organization: Not on file    Attends meetings of clubs or organizations: Not on file    Relationship status: Not on file  . Intimate partner violence:    Fear of current or ex partner: Not on file    Emotionally abused: Not on file    Physically abused: Not on file    Forced sexual activity: Not on file  Other Topics Concern  . Not on file  Social History Narrative  . Not on file    Past Surgical History:  Procedure Laterality Date  . ANGIOPLASTY     stent 2007  . CARDIAC CATHETERIZATION    . COLONOSCOPY    . CORONARY STENT INTERVENTION N/A 11/06/2017  Procedure: CORONARY STENT INTERVENTION;  Surgeon: [name], MD;  Location: [facility];  Service: Cardiovascular;  Laterality: N/A;  . LEFT HEART CATH AND CORONARY ANGIOGRAPHY N/A 11/06/2017   Procedure: LEFT HEART CATH AND CORONARY ANGIOGRAPHY;  Surgeon: [name], MD;  Location: [facility];  Service: Cardiovascular;  Laterality: N/A;  . TONSILLECTOMY      Family History  Adopted: Yes  Problem Relation Age of Onset  . Colon cancer Mother 33  . Other Unknown        patient is adopted  . Stomach cancer Neg Hx     Allergies  Allergen Reactions  . Erythromycin Rash    But isn't certain  . Sulfamethoxazole Rash    Current Outpatient Medications on File Prior to Visit  Medication Sig Dispense Refill  . allopurinol (ZYLOPRIM) 100 MG tablet Take 1 tablet (100 mg total) by mouth daily. 90 tablet 0  . ALPRAZolam (XANAX)  0.25 MG tablet TAKE 1 TABLET BY MOUTH TWICE A DAY AS NEEDED FOR ANXIETY 60 tablet 5  . aspirin EC 81 MG tablet Take 81 mg by mouth daily.    Marland Kitchen atorvastatin (LIPITOR) 40 MG tablet TAKE 1 TABLET DAILY BY MOUTH. PLEASE KEEP UPCOMING APPT FOR FUTURE REFILLS. THANKS 90 tablet 3  . calcium carbonate (CALCIUM 600) 600 MG TABS tablet Take 600 mg by mouth See admin instructions. Once every 2 weeks    . cetirizine (ZYRTEC) 10 MG tablet Take 10 mg by mouth daily.    . Cholecalciferol (VITAMIN D3 PO) Take 1,000 Units by mouth daily.    . clopidogrel (PLAVIX) 75 MG tablet Take 1 tablet (75 mg total) by mouth daily with breakfast. 90 tablet 2  . Coenzyme Q10 (CO Q 10 PO) Take 300 mg by mouth See admin instructions. Once every 2 weeks.    . Cyanocobalamin (VITAMIN B-12) 5000 MCG SUBL Place 5,000 mcg under the tongue daily.    Marland Kitchen doxycycline (VIBRA-TABS) 100 MG tablet Take 1 tablet (100 mg total) by mouth 2 (two) times daily. 14 tablet 0  . furosemide (LASIX) 40 MG tablet Take 1 tablet (40 mg total) by mouth daily. 90 tablet 3  . losartan (COZAAR) 100 MG tablet TAKE 1 TABLET (100 MG TOTAL) BY MOUTH DAILY. 90 tablet 3  . metoprolol tartrate (LOPRESSOR) 100 MG tablet TAKE 1 TABLET (100 MG TOTAL) BY MOUTH 2 (TWO) TIMES DAILY. 180 tablet 1  . Misc Natural Products (TART CHERRY ADVANCED) CAPS Take 1,000 capsules by mouth daily.     . nitroGLYCERIN (NITROSTAT) 0.4 MG SL tablet Place 1 tablet (0.4 mg total) under the tongue every 5 (five) minutes as needed for chest pain. 25 tablet 3  . Omega-3 Fatty Acids (FISH OIL OMEGA-3 PO) Take 1,400 mg by mouth See admin instructions. Once every couple weeks    . pantoprazole (PROTONIX) 40 MG tablet TAKE 1 TABLET (40 MG TOTAL) BY MOUTH DAILY. 90 tablet 1  . potassium chloride (K-DUR) 10 MEQ tablet Take 1 tablet (10 mEq total) by mouth daily. 90 tablet 3  . vitamin E 200 UNIT capsule Take 200 Units by mouth daily.     No current facility-administered medications on file prior to  visit.     BP 118/60 (BP Location: Right Arm, Patient Position: Sitting, Cuff Size: Large)   Pulse 85   Temp 98.6 F (37 C) (Oral)   Wt 205 lb (93 kg)   SpO2 95%   BMI 34.11 kg/m  Review of Systems  Constitutional: Positive for fatigue.  HENT: Negative for congestion, dental problem, hearing loss, rhinorrhea, sinus pressure, sore throat and tinnitus.   Eyes: Negative for pain, discharge and visual disturbance.  Respiratory: Positive for shortness of breath. Negative for cough.   Cardiovascular: Negative for chest pain, palpitations and leg swelling.  Gastrointestinal: Negative for abdominal distention, abdominal pain, blood in stool, constipation, diarrhea, nausea and vomiting.  Genitourinary: Negative for difficulty urinating, dysuria, flank pain, frequency, hematuria, pelvic pain, urgency, vaginal bleeding, vaginal discharge and vaginal pain.  Musculoskeletal: Positive for back pain. Negative for arthralgias, gait problem and joint swelling.  Skin: Negative for rash.  Neurological: Negative for dizziness, syncope, speech difficulty, weakness, numbness and headaches.  Hematological: Negative for adenopathy.  Psychiatric/Behavioral: Negative for agitation, behavioral problems and dysphoric mood. The patient is not nervous/anxious.        Objective:   Physical Exam  Constitutional: She is oriented to person, place, and time. She appears well-developed and well-nourished.   Weight 205 blood pressure well controlled  HENT:  Head: Normocephalic.  Right Ear: External ear normal.  Left Ear: External ear normal.  Mouth/Throat: Oropharynx is clear and moist.  Eyes: Pupils are equal, round, and reactive to light. Conjunctivae and EOM are normal.  Neck: Normal range of motion. Neck supple. No thyromegaly present.  Cardiovascular: Normal rate, regular rhythm, normal heart sounds and intact distal pulses.  Pulmonary/Chest: Effort normal and breath sounds normal. No respiratory  distress. She has no wheezes. She has no rales.  Abdominal: Soft. Bowel sounds are normal. She exhibits no mass. There is no tenderness.  Musculoskeletal: Normal range of motion. She exhibits no edema.  Lymphadenopathy:    She has no cervical adenopathy.  Neurological: She is alert and oriented to person, place, and time.  Skin: Skin is warm and dry. No rash noted.  Psychiatric: She has a normal mood and affect. Her behavior is normal.          Assessment & Plan:   Dyspnea on exertion History of diastolic heart failure Obesity History tobacco use Status post URI  We will set up pulmonary function studies for next week Will continue efforts at weight loss and continue cardiac rehab  Return in 3 months for follow-up  Nyoka Cowden

## 2018-02-23 ENCOUNTER — Other Ambulatory Visit: Payer: Self-pay | Admitting: Internal Medicine

## 2018-02-23 ENCOUNTER — Encounter (HOSPITAL_COMMUNITY)
Admission: RE | Admit: 2018-02-23 | Discharge: 2018-02-23 | Disposition: A | Discharge status: Home / Self Care | Payer: Medicare Other | Source: Ambulatory Visit | Attending: Cardiovascular Disease | Admitting: Cardiovascular Disease

## 2018-02-23 DIAGNOSIS — E785 Hyperlipidemia, unspecified: Secondary | ICD-10-CM | POA: Diagnosis not present

## 2018-02-23 DIAGNOSIS — I509 Heart failure, unspecified: Secondary | ICD-10-CM | POA: Diagnosis not present

## 2018-02-23 DIAGNOSIS — Z955 Presence of coronary angioplasty implant and graft: Secondary | ICD-10-CM

## 2018-02-23 DIAGNOSIS — I110 Hypertensive heart disease with heart failure: Secondary | ICD-10-CM | POA: Diagnosis not present

## 2018-02-23 DIAGNOSIS — Z87891 Personal history of nicotine dependence: Secondary | ICD-10-CM | POA: Diagnosis not present

## 2018-02-23 DIAGNOSIS — Z79899 Other long term (current) drug therapy: Secondary | ICD-10-CM | POA: Diagnosis not present

## 2018-02-26 ENCOUNTER — Encounter (HOSPITAL_COMMUNITY)
Admission: RE | Admit: 2018-02-26 | Discharge: 2018-02-26 | Disposition: A | Discharge status: Home / Self Care | Payer: Medicare Other | Source: Ambulatory Visit | Attending: Cardiovascular Disease | Admitting: Cardiovascular Disease

## 2018-02-26 DIAGNOSIS — Z87891 Personal history of nicotine dependence: Secondary | ICD-10-CM | POA: Diagnosis not present

## 2018-02-26 DIAGNOSIS — Z955 Presence of coronary angioplasty implant and graft: Secondary | ICD-10-CM

## 2018-02-26 DIAGNOSIS — Z79899 Other long term (current) drug therapy: Secondary | ICD-10-CM | POA: Diagnosis not present

## 2018-02-26 DIAGNOSIS — I509 Heart failure, unspecified: Secondary | ICD-10-CM | POA: Diagnosis not present

## 2018-02-26 DIAGNOSIS — I110 Hypertensive heart disease with heart failure: Secondary | ICD-10-CM | POA: Diagnosis not present

## 2018-02-26 DIAGNOSIS — E785 Hyperlipidemia, unspecified: Secondary | ICD-10-CM | POA: Diagnosis not present

## 2018-02-27 ENCOUNTER — Encounter: Payer: Self-pay | Admitting: Physician Assistant

## 2018-02-28 ENCOUNTER — Encounter (HOSPITAL_COMMUNITY)
Admission: RE | Admit: 2018-02-28 | Discharge: 2018-02-28 | Disposition: A | Discharge status: Home / Self Care | Payer: Medicare Other | Source: Ambulatory Visit | Attending: Cardiovascular Disease | Admitting: Cardiovascular Disease

## 2018-02-28 ENCOUNTER — Encounter (HOSPITAL_COMMUNITY): Payer: Self-pay

## 2018-02-28 DIAGNOSIS — Z87891 Personal history of nicotine dependence: Secondary | ICD-10-CM | POA: Diagnosis not present

## 2018-02-28 DIAGNOSIS — Z955 Presence of coronary angioplasty implant and graft: Secondary | ICD-10-CM | POA: Diagnosis not present

## 2018-02-28 DIAGNOSIS — E785 Hyperlipidemia, unspecified: Secondary | ICD-10-CM | POA: Diagnosis not present

## 2018-02-28 DIAGNOSIS — Z79899 Other long term (current) drug therapy: Secondary | ICD-10-CM | POA: Diagnosis not present

## 2018-02-28 DIAGNOSIS — I110 Hypertensive heart disease with heart failure: Secondary | ICD-10-CM | POA: Diagnosis not present

## 2018-02-28 DIAGNOSIS — I509 Heart failure, unspecified: Secondary | ICD-10-CM | POA: Diagnosis not present

## 2018-03-01 ENCOUNTER — Encounter (HOSPITAL_COMMUNITY): Payer: Self-pay

## 2018-03-01 ENCOUNTER — Telehealth: Payer: Self-pay | Admitting: Family Medicine

## 2018-03-01 NOTE — Progress Notes (Signed)
Cardiac Individual Treatment Plan  Patient Details  Name: Tracy [name] MRN: [id number] Date of Birth: Apr 25, 1946 Referring Provider:   Flowsheet Row CARDIAC REHAB PHASE II ORIENTATION from 12/19/2017 in [place]  Referring Provider  Sherren Mocha MD      Initial Encounter Date:  [place] PHASE II ORIENTATION from 12/19/2017 in [place]  Date  12/19/17  Referring Provider  Sherren Mocha MD      Visit Diagnosis: Status post coronary artery stent placement 11/06/17  S/P DES CFX   Patient's Home Medications on Admission:  Current Outpatient Medications:  .  allopurinol (ZYLOPRIM) 100 MG tablet, Take 1 tablet (100 mg total) by mouth daily., Disp: 90 tablet, Rfl: 0 .  ALPRAZolam (XANAX) 0.25 MG tablet, TAKE 1 TABLET TWICE A DAY AS NEEDED FOR ANXIETY, Disp: 60 tablet, Rfl: 0 .  aspirin EC 81 MG tablet, Take 81 mg by mouth daily., Disp: , Rfl:  .  atorvastatin (LIPITOR) 40 MG tablet, TAKE 1 TABLET DAILY BY MOUTH. PLEASE KEEP UPCOMING APPT FOR FUTURE REFILLS. THANKS, Disp: 90 tablet, Rfl: 3 .  calcium carbonate (CALCIUM 600) 600 MG TABS tablet, Take 600 mg by mouth See admin instructions. Once every 2 weeks, Disp: , Rfl:  .  cetirizine (ZYRTEC) 10 MG tablet, Take 10 mg by mouth daily., Disp: , Rfl:  .  Cholecalciferol (VITAMIN D3 PO), Take 1,000 Units by mouth daily., Disp: , Rfl:  .  clopidogrel (PLAVIX) 75 MG tablet, Take 1 tablet (75 mg total) by mouth daily with breakfast., Disp: 90 tablet, Rfl: 2 .  Coenzyme Q10 (CO Q 10 PO), Take 300 mg by mouth See admin instructions. Once every 2 weeks., Disp: , Rfl:  .  Cyanocobalamin (VITAMIN B-12) 5000 MCG SUBL, Place 5,000 mcg under the tongue daily., Disp: , Rfl:  .  furosemide (LASIX) 40 MG tablet, Take 1 tablet (40 mg total) by mouth daily., Disp: 90 tablet, Rfl: 3 .  losartan (COZAAR) 100 MG tablet, TAKE 1 TABLET (100 MG TOTAL) BY MOUTH DAILY., Disp:  90 tablet, Rfl: 3 .  metoprolol tartrate (LOPRESSOR) 100 MG tablet, TAKE 1 TABLET BY MOUTH TWICE A DAY, Disp: 180 tablet, Rfl: 1 .  Misc Natural Products (TART CHERRY ADVANCED) CAPS, Take 1,000 capsules by mouth daily. , Disp: , Rfl:  .  nitroGLYCERIN (NITROSTAT) 0.4 MG SL tablet, Place 1 tablet (0.4 mg total) under the tongue every 5 (five) minutes as needed for chest pain., Disp: 25 tablet, Rfl: 3 .  Omega-3 Fatty Acids (FISH OIL OMEGA-3 PO), Take 1,400 mg by mouth See admin instructions. Once every couple weeks, Disp: , Rfl:  .  pantoprazole (PROTONIX) 40 MG tablet, TAKE 1 TABLET (40 MG TOTAL) BY MOUTH DAILY., Disp: 90 tablet, Rfl: 1 .  potassium chloride (K-DUR) 10 MEQ tablet, Take 1 tablet (10 mEq total) by mouth daily., Disp: 90 tablet, Rfl: 3 .  vitamin E 200 UNIT capsule, Take 200 Units by mouth daily., Disp: , Rfl:   Past Medical History: Past Medical History:  Diagnosis Date  . Allergic rhinitis   . CAD (coronary artery disease)    1/19 PCI/DES to [facility] for ISR, normal EF.   [facility] Kitchen CHF (congestive heart failure) ([place])   . Gout   . Headache(784.0)   . Hyperlipidemia   . Hypertension   . Low back pain   . Menopausal syndrome   . Myocardial infarct ([place]) 2007   hx of  .  Overactive bladder     Tobacco Use: Social History   Tobacco Use  Smoking Status Former Smoker  . Last attempt to quit: 07/01/2012  . Years since quitting: 5.6  Smokeless Tobacco Never Used    Labs: Recent Review Flowsheet Data    Labs for ITP Cardiac and Pulmonary Rehab Latest Ref Rng & Units 01/05/2015 06/19/2015 10/05/2015 04/05/2016 09/13/2016   Cholestrol <200 mg/dL 180 193 164 190 154   LDLCALC <100 mg/dL - 121(H) 92 119 88   LDLDIRECT mg/dL 111.0 - - - -   HDL >50 mg/dL 32.20(L) 34.00(L) 34(L) 41(L) 31(L)   Trlycerides <150 mg/dL 294.0(H) 189.0(H) 188(H) 150(H) 175(H)      Capillary Blood Glucose: No results found for: GLUCAP   Exercise Target Goals:    Exercise Program Goal: Individual  exercise prescription set using results from initial 6 min walk test and THRR while considering  patient's activity barriers and safety.   Exercise Prescription Goal: Initial exercise prescription builds to 30-45 minutes a day of aerobic activity, 2-3 days per week.  Home exercise guidelines will be given to patient during program as part of exercise prescription that the participant will acknowledge.  Activity Barriers & Risk Stratification: Activity Barriers & Cardiac Risk Stratification - 12/19/17 0914    Activity Barriers & Cardiac Risk Stratification          Activity Barriers  Arthritis;Joint Problems;Deconditioning;Muscular Weakness;Back Problems;Other (comment);Balance Concerns;Shortness of Breath           6 Minute Walk: 6 Minute Walk    6 Minute Walk    Row Name 12/19/17 1031 12/19/17 1056   Distance  838 feet  no documentation   Walk Time  6 minutes  no documentation   # of Rest Breaks  0  no documentation   MPH  1.5  no documentation   METS  2.1  no documentation   RPE  13  no documentation   VO2 Peak  7.4  no documentation   Symptoms  No  no documentation   Resting HR  94 bpm  no documentation   Resting BP  142/82  no documentation   Resting Oxygen Saturation   96 %  no documentation   Exercise Oxygen Saturation  during 6 min walk  95 %  no documentation   Max Ex. HR  109 bpm  no documentation   Max Ex. BP  160/82  no documentation   2 Minute Post BP  104/70  120/78          Oxygen Initial Assessment:   Oxygen Re-Evaluation:   Oxygen Discharge (Final Oxygen Re-Evaluation):   Initial Exercise Prescription: Initial Exercise Prescription - 12/19/17 1000    Date of Initial Exercise RX and Referring Provider          Date  12/19/17    Referring Provider  Sherren Mocha MD        Treadmill          MPH  1.5    Grade  0    Minutes  10    METs  2.15        Recumbant Bike          Level  1.5    Watts  5    Minutes  10    METs  2.2         NuStep          Level  2    SPM  60  Minutes  10    METs  1.5        Prescription Details          Frequency (times per week)  3    Duration  Progress to 30 minutes of continuous aerobic without signs/symptoms of physical distress        Intensity          THRR 40-80% of Max Heartrate  60-121    Ratings of Perceived Exertion  11-13    Perceived Dyspnea  0-4        Progression          Progression  Continue to progress workloads to maintain intensity without signs/symptoms of physical distress.        Resistance Training          Training Prescription  Yes    Weight  2lbs    Reps  10-15           Perform Capillary Blood Glucose checks as needed.  Exercise Prescription Changes: Exercise Prescription Changes    Response to Exercise    Row Name 12/25/17 1541 01/01/18 1600 01/15/18 1420 01/29/18 1642 02/14/18 1200   Blood Pressure (Admit)  140/78  108/64  120/80  116/82  120/84   Blood Pressure (Exercise)  152/78  142/62  138/76  142/76  120/68   Blood Pressure (Exit)  130/80  98/60  108/70  114/76  108/60   Heart Rate (Admit)  91 bpm  99 bpm  88 bpm  89 bpm  108 bpm   Heart Rate (Exercise)  113 bpm  111 bpm  111 bpm  115 bpm  110 bpm   Heart Rate (Exit)  87 bpm  94 bpm  93 bpm  99 bpm  89 bpm   Rating of Perceived Exertion (Exercise)  14  13  12  12  12    Symptoms  none  none  none  none  none   Comments  pt oriented to exercise equipment today  pt oriented to exercise equipment today  no documentation  no documentation  no documentation   Duration  Progress to 30 minutes of  aerobic without signs/symptoms of physical distress  Progress to 30 minutes of  aerobic without signs/symptoms of physical distress  Progress to 30 minutes of  aerobic without signs/symptoms of physical distress  Progress to 30 minutes of  aerobic without signs/symptoms of physical distress  Progress to 30 minutes of  aerobic without signs/symptoms of physical distress   Intensity  THRR  unchanged  THRR unchanged  THRR unchanged  THRR unchanged  THRR unchanged       Progression    Row Name 12/25/17 1541 01/01/18 1600 01/15/18 1420 01/29/18 1642 02/14/18 1200   Progression  Continue to progress workloads to maintain intensity without signs/symptoms of physical distress.  Continue to progress workloads to maintain intensity without signs/symptoms of physical distress.  Continue to progress workloads to maintain intensity without signs/symptoms of physical distress.  Continue to progress workloads to maintain intensity without signs/symptoms of physical distress.  Continue to progress workloads to maintain intensity without signs/symptoms of physical distress.   Average METs  2.2  1.9  2.3  2.7  2.6       Resistance Training    Row Name 12/25/17 1541 01/01/18 1600 01/15/18 1420 01/29/18 1642 02/14/18 1200   Training Prescription  Yes  Yes  Yes  Yes  No relaxation day   Weight  2lbs  2lbs  4lbs  4lbs  no documentation   Reps  10-15  10-15  10-15  10-15  no documentation   Time  10 Minutes  10 Minutes  10 Minutes  10 Minutes  [place] Name 12/25/17 1541 01/01/18 1600 01/15/18 1420 01/29/18 1642 02/14/18 1200   MPH  1.3  1.3  1.7  1.8  1.9   Grade  0  0  0  1  1   Minutes  5  10  10  10  10    METs  1.99  1.99  2.3  2.63  2.72       Recumbant Bike    Row Name 12/25/17 1541 01/01/18 1600 01/15/18 1420 01/29/18 1642 02/14/18 1200   Level  1.5  1.5  2.5  2.5  3   Watts  15  15  10  10  10    Minutes  10  10  10  10  10    METs  2.5  1.6  2.3  2.4  2.4       NuStep    Row Name 12/25/17 1541 01/01/18 1600 01/15/18 1420 01/29/18 1642 02/14/18 1200   Level  2  2  4  5  5    SPM  80  80  85  90  85   Minutes  10  10  10  10  10    METs  2.3  2.2  2.5  3  2.6       Track    Row Name 12/25/17 1541 01/01/18 1600 01/15/18 1420 01/29/18 1642 02/14/18 1200   Laps  5  no documentation  no documentation  no documentation  no documentation   Minutes  5  no  documentation  no documentation  no documentation  no documentation   METs  2  no documentation  no documentation  no documentation  no documentation       Home Exercise Plan    [place] Name 12/25/17 1541 01/01/18 1600 01/15/18 1420 01/29/18 1642 02/14/18 1200   Plans to continue exercise at  no documentation  no documentation  Home (comment)  Home (comment)  Home (comment)   Frequency  no documentation  no documentation  Add 2 additional days to program exercise sessions.  Add 2 additional days to program exercise sessions.  Add 2 additional days to program exercise sessions.   Initial Home Exercises Provided  no documentation  no documentation  01/05/18  01/05/18  01/05/18       Response to Exercise    Row Name 02/28/18 1100   Blood Pressure (Admit)  110/82   Blood Pressure (Exercise)  140/80   Blood Pressure (Exit)  104/68   Heart Rate (Admit)  90 bpm   Heart Rate (Exercise)  107 bpm   Heart Rate (Exit)  89 bpm   Rating of Perceived Exertion (Exercise)  12   Symptoms  none   Duration  Progress to 30 minutes of  aerobic without signs/symptoms of physical distress   Intensity  THRR unchanged       Progression    Row Name 02/28/18 1100   Progression  Continue to progress workloads to maintain intensity without signs/symptoms of physical distress.   Average METs  2.4       Resistance Training    Row Name 02/28/18 1100   Training Prescription  No relaxation day   Time  10 Minutes  Treadmill    Row Name 02/28/18 1100   MPH  2   Grade  1   Minutes  10   METs  2.81       Recumbant Bike    Row Name 02/28/18 1100   Level  3   Watts  10   Minutes  10   METs  1.9       NuStep    Row Name 02/28/18 1100   Level  5   SPM  85   Minutes  10   METs  2.6       Home Exercise Plan    [place] Name 02/28/18 1100   Plans to continue exercise at  Home (comment)   Frequency  Add 2 additional days to program exercise sessions.   Initial Home Exercises Provided  01/05/18           Exercise Comments: Exercise Comments    Row Name 12/25/17 1545 01/01/18 1619 01/05/18 1045 01/30/18 1611 02/27/18 1539   Exercise Comments  Pt responded well to first exercise session. Pt was able to exercise for 30 minutes without signs/symptoms of physical distress or pain.  Reviewed goals and activity levels. Pt is progressing well in cardiac rehab and is motivated to exercise for 30 minutes; will continue to monitor and progress WLs as able.   Reviewed HEP on 01/05/18  Reviewed METs and goals. Pt is progressing well in cardiac rehab. Pt handles WL increases very well. Cardiac rehab staff will continue to monitor and progress activity as tolerated.   Reviewed METs and goals. Pt is progressing well in cardiac rehab. Pt handles WL increases very well. Cardiac rehab staff will continue to monitor and progress activity as tolerated.       Exercise Goals and Review: Exercise Goals    Exercise Goals    Row Name 12/19/17 0912   Increase Physical Activity  Yes   Intervention  Provide advice, education, support and counseling about physical activity/exercise needs.;Develop an individualized exercise prescription for aerobic and resistive training based on initial evaluation findings, risk stratification, comorbidities and participant's personal goals.   Expected Outcomes  Long Term: Exercising regularly at least 3-5 days a week.;Short Term: Attend rehab on a regular basis to increase amount of physical activity.;Long Term: Add in home exercise to make exercise part of routine and to increase amount of physical activity.   Increase Strength and Stamina  Yes improve walking tolerance/capacity   Intervention  Provide advice, education, support and counseling about physical activity/exercise needs.;Develop an individualized exercise prescription for aerobic and resistive training based on initial evaluation findings, risk stratification, comorbidities and participant's personal goals.   Expected Outcomes   Short Term: Increase workloads from initial exercise prescription for resistance, speed, and METs.;Short Term: Perform resistance training exercises routinely during rehab and add in resistance training at home;Long Term: Improve cardiorespiratory fitness, muscular endurance and strength as measured by increased METs and functional capacity (6MWT)   Able to understand and use rate of perceived exertion (RPE) scale  Yes   Intervention  Provide education and explanation on how to use RPE scale   Expected Outcomes  Short Term: Able to use RPE daily in rehab to express subjective intensity level;Long Term:  Able to use RPE to guide intensity level when exercising independently   Knowledge and understanding of Target Heart Rate Range (THRR)  Yes   Intervention  Provide education and explanation of THRR including how the numbers were predicted and where they are located  for reference   Expected Outcomes  Short Term: Able to state/look up THRR;Long Term: Able to use THRR to govern intensity when exercising independently;Short Term: Able to use daily as guideline for intensity in rehab   Able to check pulse independently  Yes   Intervention  Provide education and demonstration on how to check pulse in carotid and radial arteries.;Review the importance of being able to check your own pulse for safety during independent exercise   Expected Outcomes  Short Term: Able to explain why pulse checking is important during independent exercise;Long Term: Able to check pulse independently and accurately   Understanding of Exercise Prescription  Yes   Intervention  Provide education, explanation, and written materials on patient's individual exercise prescription   Expected Outcomes  Short Term: Able to explain program exercise prescription;Long Term: Able to explain home exercise prescription to exercise independently          Exercise Goals Re-Evaluation : Exercise Goals Re-Evaluation    Exercise Goal  Re-Evaluation    Row Name 01/01/18 1621 01/05/18 1046 01/30/18 1613 02/27/18 1540   Exercise Goals Review  Increase Physical Activity;Able to understand and use rate of perceived exertion (RPE) scale;Knowledge and understanding of Target Heart Rate Range (THRR);Understanding of Exercise Prescription;Increase Strength and Stamina;Able to check pulse independently  Increase Physical Activity;Able to understand and use rate of perceived exertion (RPE) scale;Knowledge and understanding of Target Heart Rate Range (THRR);Understanding of Exercise Prescription;Increase Strength and Stamina;Able to check pulse independently  Increase Physical Activity;Able to understand and use rate of perceived exertion (RPE) scale;Knowledge and understanding of Target Heart Rate Range (THRR);Understanding of Exercise Prescription;Increase Strength and Stamina;Able to check pulse independently  Increase Physical Activity;Able to understand and use rate of perceived exertion (RPE) scale;Knowledge and understanding of Target Heart Rate Range (THRR);Understanding of Exercise Prescription;Increase Strength and Stamina;Able to check pulse independently   Comments  Pt has increased exercise tolerance on treadmill by walking for a full 10 minutes without stopping. Pt was happy about accomplishment and is eager to continue exercise prescription  Reviewed HEP [place]. Pt understands emergency precautions /activity limitations  [place] mall for home exercise program. Pt was able to walk the entire first floor without extreme fatigue/exhaustion. Pt new goal is to be able to walk from the main entrance of the hospital to cardiac rehab (~1/60mi) without stopping or without  SOB/fatigue.  Pt has increased walking pace on treadmill to 2.0 mph. Pt is still exercising at the mall but still struggles with stamina and energy levels at the end of the day.   Expected Outcomes  Pt will continue to improve in  cardiorespiratory and functional fitness  Pt will continue to improve in cardiorespiratory and functional fitness  Pt will continue to improve in cardiorespiratory and functional fitness  Pt will continue to improve in cardiorespiratory and functional fitness           Discharge Exercise Prescription (Final Exercise Prescription Changes): Exercise Prescription Changes - 02/28/18 1100    Response to Exercise          Blood Pressure (Admit)  110/82    Blood Pressure (Exercise)  140/80    Blood Pressure (Exit)  104/68    Heart Rate (Admit)  90 bpm    Heart Rate (Exercise)  107 bpm    Heart Rate (Exit)  89 bpm    Rating of Perceived Exertion (Exercise)  12    Symptoms  none    Duration  Progress to 30 minutes of  aerobic without signs/symptoms of physical distress    Intensity  THRR unchanged        Progression          Progression  Continue to progress workloads to maintain intensity without signs/symptoms of physical distress.    Average METs  2.4        Resistance Training          Training Prescription  No relaxation day    Time  10 Minutes        Treadmill          MPH  2    Grade  1    Minutes  10    METs  2.81        Recumbant Bike          Level  3    Watts  10    Minutes  10    METs  1.9        NuStep          Level  5    SPM  85    Minutes  10    METs  2.6        Home Exercise Plan          Plans to continue exercise at  Home (comment)    Frequency  Add 2 additional days to program exercise sessions.    Initial Home Exercises Provided  01/05/18           Nutrition:  Target Goals: Understanding of nutrition guidelines, daily intake of sodium 1500mg , cholesterol 200mg , calories 30% from fat and 7% or less from saturated fats, daily to have 5 or more servings of fruits and vegetables.  Biometrics: Pre Biometrics - 12/19/17 1037    Pre Biometrics          Height  5' 6.25" (1.683 m)    Weight  208 lb 1.8 oz (94.4 kg)    Waist  Circumference  44 inches    Hip Circumference  48.75 inches    Waist to Hip Ratio  0.9 %    BMI (Calculated)  33.33    Triceps Skinfold  55 mm    % Body Fat  48.6 %    Grip Strength  22 kg    Flexibility  11 in    Single Leg Stand  2 seconds            Nutrition Therapy Plan and Nutrition Goals: Nutrition Therapy & Goals - 12/19/17 1022    Nutrition Therapy          Diet  Heart Healthy        Personal Nutrition Goals          Nutrition Goal  Pt to identify food quantities necessary to achieve weight loss of 6-24 lb (2.7-10.9 kg) at graduation from cardiac rehab. Goal wt of 150 lb desired.         Intervention Plan          Intervention  Prescribe, educate and counsel regarding individualized specific dietary modifications aiming towards targeted core components such as weight, hypertension, lipid management, diabetes, heart failure and other comorbidities.    Expected Outcomes  Short Term Goal: Understand basic principles of dietary content, such as calories, fat, sodium, cholesterol and nutrients.;Long Term Goal: Adherence to prescribed nutrition plan.           Nutrition Assessments: Nutrition Assessments -  12/19/17 1021    MEDFICTS Scores          Pre Score  24           Nutrition Goals Re-Evaluation:   Nutrition Goals Re-Evaluation:   Nutrition Goals Discharge (Final Nutrition Goals Re-Evaluation):   Psychosocial: Target Goals: Acknowledge presence or absence of significant depression and/or stress, maximize coping skills, provide positive support system. Participant is able to verbalize types and ability to use techniques and skills needed for reducing stress and depression.  Initial Review & Psychosocial Screening: Initial Psych Review & Screening - 12/19/17 [place]?  Yes        Barriers          Psychosocial barriers to participate in program  There are no identifiable barriers or psychosocial  needs.        Screening Interventions          Interventions  Encouraged to exercise    Expected Outcomes  Short Term goal: Utilizing psychosocial counselor, staff and physician to assist with identification of specific Stressors or current issues interfering with healing process. Setting desired goal for each stressor or current issue identified.;Long Term Goal: Stressors or current issues are controlled or eliminated.;Short Term goal: Identification and review with participant of any Quality of Life or Depression concerns found by scoring the questionnaire.;Long Term goal: The participant improves quality of Life and PHQ9 Scores as seen by post scores and/or verbalization of changes           Quality of Life Scores: Quality of Life - 01/03/18 1445    Quality of Life Scores          Health/Function Pre  18.97 % c/o DOE concerned she may have extra fluid overload.  pt states she is not able to take lasix on exercise days.  pt encouraged to take lasix when she gets home from exercise. pt also c/o fatigue and low energy.  pt encouraged regular exercise will help.     Socioeconomic Pre  22.5 %    Psych/Spiritual Pre  20.21 %    Family Pre  22.8 %    GLOBAL Pre  20.62 % overal QOL scores reflective of recent cardiac event. pt offered counseling with Jeanella Craze. pt will let us know. pt offered emotional support and reassuranc.e           Scores of 19 and below usually indicate a poorer quality of life in these areas.  A difference of  2-3 points is a clinically meaningful difference.  A difference of 2-3 points in the total score of the Quality of Life Index has been associated with significant improvement in overall quality of life, self-image, physical symptoms, and general health in studies assessing change in quality of life.  PHQ-9: Recent Review Flowsheet Data    Depression screen [facility] 2/9 12/25/2017 10/18/2016 10/05/2016 08/05/2015 03/23/2015   Decreased Interest 1 0 0 0 0   Down,  Depressed, Hopeless 1 0 0 0 0   PHQ - 2 Score 2 0 0 0 0   Altered sleeping 1 - - - -   Tired, decreased energy 1 - - - -   Change in appetite 0 - - - -   Feeling bad or failure about yourself  1 - - - -   Trouble concentrating 1 - - - -   Moving slowly or fidgety/restless 0 - - - -  Suicidal thoughts 0 - - - -   PHQ-9 Score 6 - - - -   Difficult doing work/chores Somewhat difficult - - - -     Interpretation of Total Score  Total Score Depression Severity:  1-4 = Minimal depression, 5-9 = Mild depression, 10-14 = Moderate depression, 15-19 = Moderately severe depression, 20-27 = Severe depression   Psychosocial Evaluation and Intervention: Psychosocial Evaluation - 01/01/18 1531    Psychosocial Evaluation & Interventions          Interventions  Encouraged to exercise with the program and follow exercise prescription;Stress management education;Relaxation education    Comments  pt with multiple stressors including sister battling cancer and her recent cardiac event.  pt enjoys spending time with her husband.      Expected Outcomes  pt will exhibit improved outlook with good coping skills.     Continue Psychosocial Services   Follow up required by counselor           Psychosocial Re-Evaluation: Psychosocial Re-Evaluation    Psychosocial Re-Evaluation    [place] Name 01/01/18 1532 01/31/18 1644 03/01/18 1093   Current issues with  Current Stress Concerns;Current Anxiety/Panic  Current Stress Concerns;Current Anxiety/Panic  Current Stress Concerns;Current Anxiety/Panic   Comments  pt with health related anxiety and situational stress from sister with cancer. pt encouraged to meet with Jeanella Craze.  pt will let staff know if she is ready to schedule appointment.   pt with health related anxiety and situational stress from sister with cancer. pt exhibits improved outlook and coping skills.   pt with health related anxiety and situational stress from sister with cancer. pt exhibits improved  outlook and coping skills. pt making positive interactions with staff and classmates    Expected Outcomes  pt will exhibit improved outlook with good coping skills.   pt will exhibit improved outlook with good coping skills.   pt will exhibit improved outlook with good coping skills.    Interventions  Stress management education;Encouraged to attend Cardiac Rehabilitation for the exercise;Relaxation education  Stress management education;Encouraged to attend Cardiac Rehabilitation for the exercise;Relaxation education  Stress management education;Encouraged to attend Cardiac Rehabilitation for the exercise;Relaxation education   Continue Psychosocial Services   Follow up required by staff  Follow up required by staff  Follow up required by staff   Comments  pt offered Jeanella Craze counseling services.   pt offered Jeanella Craze counseling services.   pt offered Jeanella Craze counseling services.        Initial Review    Row Name 01/01/18 1532 01/31/18 1644 03/01/18 0828   Source of Stress Concerns  Chronic Illness;Poor Coping Skills;Family  Chronic Illness;Poor Coping Skills;Family  Chronic Illness;Poor Coping Skills;Family          Psychosocial Discharge (Final Psychosocial Re-Evaluation): Psychosocial Re-Evaluation - 03/01/18 0828    Psychosocial Re-Evaluation          Current issues with  Current Stress Concerns;Current Anxiety/Panic    Comments  pt with health related anxiety and situational stress from sister with cancer. pt exhibits improved outlook and coping skills. pt making positive interactions with staff and classmates     Expected Outcomes  pt will exhibit improved outlook with good coping skills.     Interventions  Stress management education;Encouraged to attend Cardiac Rehabilitation for the exercise;Relaxation education    Continue Psychosocial Services   Follow up required by staff    Comments  pt offered Jeanella Craze counseling services.  Initial Review           Source of Stress Concerns  Chronic Illness;Poor Coping Skills;Family           Vocational Rehabilitation: Provide vocational rehab assistance to qualifying candidates.   Vocational Rehab Evaluation & Intervention: Vocational Rehab - 12/19/17 1124    Initial Vocational Rehab Evaluation & Intervention          Assessment shows need for Vocational Rehabilitation  No Lianny is retired and does not need vocational rehab at this time           Education: Education Goals: Education classes will be provided on a weekly basis, covering required topics. Participant will state understanding/return demonstration of topics presented.  Learning Barriers/Preferences: Learning Barriers/Preferences - 12/19/17 0910    Learning Barriers/Preferences          Learning Barriers  Sight    Learning Preferences  Video;Pictoral           Education Topics: Count Your Pulse:  -Group instruction provided by verbal instruction, demonstration, patient participation and written materials to support subject.  Instructors address importance of being able to find your pulse and how to count your pulse when at home without a heart monitor.  Patients get hands on experience counting their pulse with staff help and individually. Flowsheet Row CARDIAC REHAB PHASE II EXERCISE from 02/28/2018 in [place]  Date  01/26/18  Educator  RN  Instruction Review Code  2- Demonstrated Understanding      Heart Attack, Angina, and Risk Factor Modification:  -Group instruction provided by verbal instruction, video, and written materials to support subject.  Instructors address signs and symptoms of angina and heart attacks.    Also discuss risk factors for heart disease and how to make changes to improve heart health risk factors. Flowsheet Row CARDIAC REHAB PHASE II EXERCISE from 02/28/2018 in [place]  Date  02/07/18  Educator  RN  Instruction Review  Code  2- Demonstrated Understanding      Functional Fitness:  -Group instruction provided by verbal instruction, demonstration, patient participation, and written materials to support subject.  Instructors address safety measures for doing things around the house.  Discuss how to get up and down off the floor, how to pick things up properly, how to safely get out of a chair without assistance, and balance training. Flowsheet Row CARDIAC REHAB PHASE II EXERCISE from 02/28/2018 in [place]  Date  12/29/17  Educator  EP  Instruction Review Code  2- Demonstrated Understanding      Meditation and Mindfulness:  -Group instruction provided by verbal instruction, patient participation, and written materials to support subject.  Instructor addresses importance of mindfulness and meditation practice to help reduce stress and improve awareness.  Instructor also leads participants through a meditation exercise.  Flowsheet Row CARDIAC REHAB PHASE II EXERCISE from 02/28/2018 in [place]  Date  02/28/18  Instruction Review Code  2- Demonstrated Understanding      Stretching for Flexibility and Mobility:  -Group instruction provided by verbal instruction, patient participation, and written materials to support subject.  Instructors lead participants through series of stretches that are designed to increase flexibility thus improving mobility.  These stretches are additional exercise for major muscle groups that are typically performed during regular warm up and cool down.   Hands Only CPR:  -Group verbal, video, and participation provides a basic overview of  AHA guidelines for community CPR. Role-play of emergencies allow participants the opportunity to practice calling for help and chest compression technique with discussion of AED use.   Hypertension: -Group verbal and written instruction that provides a basic overview of hypertension  including the most recent diagnostic guidelines, risk factor reduction with self-care instructions and medication management. Flowsheet Row CARDIAC REHAB PHASE II EXERCISE from 02/28/2018 in [place]  Date  02/16/18  Educator  RN  Instruction Review Code  2- Demonstrated Understanding       Nutrition I class: Heart Healthy Eating:  -Group instruction provided by PowerPoint slides, verbal discussion, and written materials to support subject matter. The instructor gives an explanation and review of the Therapeutic Lifestyle Changes diet recommendations, which includes a discussion on lipid goals, dietary fat, sodium, fiber, plant stanol/sterol esters, sugar, and the components of a well-balanced, healthy diet.   Nutrition II class: Lifestyle Skills:  -Group instruction provided by PowerPoint slides, verbal discussion, and written materials to support subject matter. The instructor gives an explanation and review of label reading, grocery shopping for heart health, heart healthy recipe modifications, and ways to make healthier choices when eating out.   Diabetes Question & Answer:  -Group instruction provided by PowerPoint slides, verbal discussion, and written materials to support subject matter. The instructor gives an explanation and review of diabetes co-morbidities, pre- and post-prandial blood glucose goals, pre-exercise blood glucose goals, signs, symptoms, and treatment of hypoglycemia and hyperglycemia, and foot care basics. Flowsheet Row CARDIAC REHAB PHASE II EXERCISE from 02/28/2018 in [place]  Date  02/23/18  Educator  RD  Instruction Review Code  2- Demonstrated Understanding      Diabetes Blitz:  -Group instruction provided by PowerPoint slides, verbal discussion, and written materials to support subject matter. The instructor gives an explanation and review of the physiology behind type 1 and type 2 diabetes,  diabetes medications and rational behind using different medications, pre- and post-prandial blood glucose recommendations and Hemoglobin A1c goals, diabetes diet, and exercise including blood glucose guidelines for exercising safely.    Portion Distortion:  -Group instruction provided by PowerPoint slides, verbal discussion, written materials, and food models to support subject matter. The instructor gives an explanation of serving size versus portion size, changes in portions sizes over the last 20 years, and what consists of a serving from each food group. Flowsheet Row CARDIAC REHAB PHASE II EXERCISE from 02/28/2018 in [place]  Date  01/10/18  Educator  RD  Instruction Review Code  2- Demonstrated Understanding      Stress Management:  -Group instruction provided by verbal instruction, video, and written materials to support subject matter.  Instructors review role of stress in heart disease and how to cope with stress positively.   Flowsheet Row CARDIAC REHAB PHASE II EXERCISE from 02/28/2018 in [place]  Date  01/17/18  Educator  RN  Instruction Review Code  2- Demonstrated Understanding      Exercising on Your Own:  -Group instruction provided by verbal instruction, power point, and written materials to support subject.  Instructors discuss benefits of exercise, components of exercise, frequency and intensity of exercise, and end points for exercise.  Also discuss use of nitroglycerin and activating EMS.  Review options of places to exercise outside of rehab.  Review guidelines for sex with heart disease. Flowsheet Row CARDIAC REHAB PHASE II EXERCISE from 02/28/2018 in [place]  [facility]  Date  02/14/18  Educator  EP  Instruction Review Code  2- Demonstrated Understanding      Cardiac Drugs I:  -Group instruction provided by verbal instruction and written materials to support subject.   Instructor reviews cardiac drug classes: antiplatelets, anticoagulants, beta blockers, and statins.  Instructor discusses reasons, side effects, and lifestyle considerations for each drug class. Flowsheet Row CARDIAC REHAB PHASE II EXERCISE from 02/28/2018 in [place]  Date  02/21/18  Instruction Review Code  2- Demonstrated Understanding      Cardiac Drugs II:  -Group instruction provided by verbal instruction and written materials to support subject.  Instructor reviews cardiac drug classes: angiotensin converting enzyme inhibitors (ACE-I), angiotensin II receptor blockers (ARBs), nitrates, and calcium channel blockers.  Instructor discusses reasons, side effects, and lifestyle considerations for each drug class. Flowsheet Row CARDIAC REHAB PHASE II EXERCISE from 02/28/2018 in [place]  Date  01/24/18  Educator  -- [pharmacist]  Instruction Review Code  2- Demonstrated Understanding      Anatomy and Physiology of the Circulatory System:  Group verbal and written instruction and models provide basic cardiac anatomy and physiology, with the coronary electrical and arterial systems. Review of: AMI, Angina, Valve disease, Heart Failure, Peripheral Artery Disease, Cardiac Arrhythmia, Pacemakers, and the ICD. Flowsheet Row CARDIAC REHAB PHASE II EXERCISE from 02/28/2018 in [place]  Date  01/31/18  Instruction Review Code  2- Demonstrated Understanding      Other Education:  -Group or individual verbal, written, or video instructions that support the educational goals of the cardiac rehab program.   Holiday Eating Survival Tips:  -Group instruction provided by PowerPoint slides, verbal discussion, and written materials to support subject matter. The instructor gives patients tips, tricks, and techniques to help them not only survive but enjoy the holidays despite the onslaught of food that  accompanies the holidays.   Knowledge Questionnaire Score: Knowledge Questionnaire Score - 12/19/17 1031    Knowledge Questionnaire Score          Pre Score  22/24           Core Components/Risk Factors/Patient Goals at Admission: Personal Goals and Risk Factors at Admission - 12/19/17 1040    Core Components/Risk Factors/Patient Goals on Admission           Weight Management  Yes;Obesity;Weight Maintenance;Weight Loss    Intervention  Weight Management: Develop a combined nutrition and exercise program designed to reach desired caloric intake, while maintaining appropriate intake of nutrient and fiber, sodium and fats, and appropriate energy expenditure required for the weight goal.;Weight Management: Provide education and appropriate resources to help participant work on and attain dietary goals.;Weight Management/Obesity: Establish reasonable short term and long term weight goals.;Obesity: Provide education and appropriate resources to help participant work on and attain dietary goals.    Admit Weight  208 lb 1.8 oz (94.4 kg)    Goal Weight: Short Term  200 lb (90.7 kg)    Goal Weight: Long Term  190 lb (86.2 kg)    Expected Outcomes  Short Term: Continue to assess and modify interventions until short term weight is achieved;Long Term: Adherence to nutrition and physical activity/exercise program aimed toward attainment of established weight goal;Weight Maintenance: Understanding of the daily nutrition guidelines, which includes 25-35% calories from fat, 7% or less cal from saturated fats, less than 200mg  cholesterol, less than 1.5gm of sodium, & 5 or  more servings of fruits and vegetables daily;Weight Loss: Understanding of general recommendations for a balanced deficit meal plan, which promotes 1-2 lb weight loss per week and includes a negative energy balance of [phone] kcal/d;Understanding recommendations for meals to include 15-35% energy as protein, 25-35% energy from fat, 35-60%  energy from carbohydrates, less than 200mg  of dietary cholesterol, 20-35 gm of total fiber daily;Understanding of distribution of calorie intake throughout the day with the consumption of 4-5 meals/snacks    Improve shortness of breath with ADL's  Yes    Intervention  Provide education, individualized exercise plan and daily activity instruction to help decrease symptoms of SOB with activities of daily living.    Expected Outcomes  Short Term: Improve cardiorespiratory fitness to achieve a reduction of symptoms when performing ADLs;Long Term: Be able to perform more ADLs without symptoms or delay the onset of symptoms    Heart Failure  Yes    Intervention  Provide a combined exercise and nutrition program that is supplemented with education, support and counseling about heart failure. Directed toward relieving symptoms such as shortness of breath, decreased exercise tolerance, and extremity edema.    Expected Outcomes  Improve functional capacity of life;Long term: Adoption of self-care skills and reduction of barriers for early signs and symptoms recognition and intervention leading to self-care maintenance.;Short term: Daily weights obtained and reported for increase. Utilizing diuretic protocols set by physician.;Short term: Attendance in program 2-3 days a week with increased exercise capacity. Reported lower sodium intake. Reported increased fruit and vegetable intake. Reports medication compliance.    Hypertension  Yes    Intervention  Provide education on lifestyle modifcations including regular physical activity/exercise, weight management, moderate sodium restriction and increased consumption of fresh fruit, vegetables, and low fat dairy, alcohol moderation, and smoking cessation.;Monitor prescription use compliance.    Expected Outcomes  Short Term: Continued assessment and intervention until BP is < 140/75mm HG in hypertensive participants. < 130/35mm HG in hypertensive participants with  diabetes, heart failure or chronic kidney disease.;Long Term: Maintenance of blood pressure at goal levels.    Lipids  Yes    Intervention  Provide education and support for participant on nutrition & aerobic/resistive exercise along with prescribed medications to achieve LDL 70mg , HDL >40mg .    Expected Outcomes  Short Term: Participant states understanding of desired cholesterol values and is compliant with medications prescribed. Participant is following exercise prescription and nutrition guidelines.;Long Term: Cholesterol controlled with medications as prescribed, with individualized exercise RX and with personalized nutrition plan. Value goals: LDL < 70mg , HDL > 40 mg.    Stress  Yes    Intervention  Offer individual and/or small group education and counseling on adjustment to heart disease, stress management and health-related lifestyle change. Teach and support self-help strategies.;Refer participants experiencing significant psychosocial distress to appropriate mental health specialists for further evaluation and treatment. When possible, include family members and significant others in education/counseling sessions.    Expected Outcomes  Short Term: Participant demonstrates changes in health-related behavior, relaxation and other stress management skills, ability to obtain effective social support, and compliance with psychotropic medications if prescribed.;Long Term: Emotional wellbeing is indicated by absence of clinically significant psychosocial distress or social isolation.           Core Components/Risk Factors/Patient Goals Review:  Goals and Risk Factor Review    Core Components/Risk Factors/Patient Goals Review    Row Name 12/25/17 0941 01/01/18 1531 01/31/18 1643 03/01/18 0826   Personal Goals Review  Weight Management/Obesity;Improve shortness  of breath with ADL's;Heart Failure;Hypertension;Stress;Lipids  Weight Management/Obesity;Improve shortness of breath with ADL's;Heart  Failure;Hypertension;Stress;Lipids  Weight Management/Obesity;Improve shortness of breath with ADL's;Heart Failure;Hypertension;Stress;Lipids  Weight Management/Obesity;Improve shortness of breath with ADL's;Heart Failure;Hypertension;Stress;Lipids   Review  pt with multiple CAD RF demostrates eagerness to participate in CR program.  pt personal goals are to improve dypsnea and improve balance.   pt with multiple CAD RF demostrates eagerness to participate in CR program.  pt personal goals are to improve dypsnea and improve balance.   pt with multiple CAD RF demostrates eagerness to participate in CR program.  pt personal goals are to improve dypsnea and improve balance. pt reports she is able to walk from [facility] without rest break.    pt with multiple CAD RF demostrates eagerness to participate in CR program.  pt personal goals are to improve dypsnea and improve balance. pt currently undergoing COPD evaluation. pt uses cart at [facility] to walk store without difficulty.     Expected Outcomes  pt will participate in CR exercise, nutrition and lifestyle modifications to decrease overall RF.   pt will participate in CR exercise, nutrition and lifestyle modifications to decrease overall RF.   pt will participate in CR exercise, nutrition and lifestyle modifications to decrease overall RF.   pt will participate in CR exercise, nutrition and lifestyle modifications to decrease overall RF.           Core Components/Risk Factors/Patient Goals at Discharge (Final Review):  Goals and Risk Factor Review - 03/01/18 0826    Core Components/Risk Factors/Patient Goals Review          Personal Goals Review  Weight Management/Obesity;Improve shortness of breath with ADL's;Heart Failure;Hypertension;Stress;Lipids    Review  pt with multiple CAD RF demostrates eagerness to participate in CR program.  pt personal goals are to improve dypsnea and improve balance. pt currently undergoing COPD evaluation. pt uses cart at  [facility] to walk store without difficulty.      Expected Outcomes  pt will participate in CR exercise, nutrition and lifestyle modifications to decrease overall RF.            ITP Comments: ITP Comments    Row Name 12/19/17 0909 12/25/17 0915 01/01/18 1531 01/31/18 1643 03/01/18 0826   ITP Comments  Medical Director, Dr. Fransico Him, MD  pt started group exercise.  pt demonstrates eagerness to participate in CR program.   30day ITP review. pt with good attendance and participation.   30day ITP review. pt with good attendance and participation.   30day ITP review. pt with good attendance and participation.       Comments:

## 2018-03-01 NOTE — Telephone Encounter (Signed)
Copied from [place] [id number]. Topic: Referral - Status >> Mar 01, 2018 12:28 PM Hewitt Shorts wrote: Pt is checking on status of a pulmonary procedure that Dr. Raliegh Ip was supposed to be referring but nothing is in system   Best number [phone]

## 2018-03-02 ENCOUNTER — Encounter (HOSPITAL_COMMUNITY)
Admission: RE | Admit: 2018-03-02 | Discharge: 2018-03-02 | Disposition: A | Discharge status: Home / Self Care | Payer: Medicare Other | Source: Ambulatory Visit | Attending: Cardiovascular Disease | Admitting: Cardiovascular Disease

## 2018-03-02 DIAGNOSIS — I509 Heart failure, unspecified: Secondary | ICD-10-CM | POA: Diagnosis not present

## 2018-03-02 DIAGNOSIS — Z87891 Personal history of nicotine dependence: Secondary | ICD-10-CM | POA: Diagnosis not present

## 2018-03-02 DIAGNOSIS — Z79899 Other long term (current) drug therapy: Secondary | ICD-10-CM | POA: Diagnosis not present

## 2018-03-02 DIAGNOSIS — Z955 Presence of coronary angioplasty implant and graft: Secondary | ICD-10-CM | POA: Diagnosis not present

## 2018-03-02 DIAGNOSIS — I110 Hypertensive heart disease with heart failure: Secondary | ICD-10-CM | POA: Diagnosis not present

## 2018-03-02 DIAGNOSIS — E785 Hyperlipidemia, unspecified: Secondary | ICD-10-CM | POA: Diagnosis not present

## 2018-03-02 NOTE — Telephone Encounter (Signed)
Do you recall this?  I don't remember this if I was told to order Please advise

## 2018-03-05 ENCOUNTER — Encounter (HOSPITAL_COMMUNITY)
Admission: RE | Admit: 2018-03-05 | Discharge: 2018-03-05 | Disposition: A | Discharge status: Home / Self Care | Payer: Medicare Other | Source: Ambulatory Visit | Attending: Cardiovascular Disease | Admitting: Cardiovascular Disease

## 2018-03-05 DIAGNOSIS — Z79899 Other long term (current) drug therapy: Secondary | ICD-10-CM | POA: Diagnosis not present

## 2018-03-05 DIAGNOSIS — E785 Hyperlipidemia, unspecified: Secondary | ICD-10-CM | POA: Diagnosis not present

## 2018-03-05 DIAGNOSIS — Z87891 Personal history of nicotine dependence: Secondary | ICD-10-CM | POA: Diagnosis not present

## 2018-03-05 DIAGNOSIS — I509 Heart failure, unspecified: Secondary | ICD-10-CM | POA: Diagnosis not present

## 2018-03-05 DIAGNOSIS — Z955 Presence of coronary angioplasty implant and graft: Secondary | ICD-10-CM | POA: Diagnosis not present

## 2018-03-05 DIAGNOSIS — I110 Hypertensive heart disease with heart failure: Secondary | ICD-10-CM | POA: Diagnosis not present

## 2018-03-07 ENCOUNTER — Encounter (HOSPITAL_COMMUNITY)
Admission: RE | Admit: 2018-03-07 | Discharge: 2018-03-07 | Disposition: A | Discharge status: Home / Self Care | Payer: Medicare Other | Source: Ambulatory Visit | Attending: Cardiovascular Disease | Admitting: Cardiovascular Disease

## 2018-03-07 DIAGNOSIS — Z955 Presence of coronary angioplasty implant and graft: Secondary | ICD-10-CM

## 2018-03-07 DIAGNOSIS — E785 Hyperlipidemia, unspecified: Secondary | ICD-10-CM | POA: Diagnosis not present

## 2018-03-07 DIAGNOSIS — Z87891 Personal history of nicotine dependence: Secondary | ICD-10-CM | POA: Diagnosis not present

## 2018-03-07 DIAGNOSIS — I509 Heart failure, unspecified: Secondary | ICD-10-CM | POA: Diagnosis not present

## 2018-03-07 DIAGNOSIS — I110 Hypertensive heart disease with heart failure: Secondary | ICD-10-CM | POA: Diagnosis not present

## 2018-03-07 DIAGNOSIS — Z79899 Other long term (current) drug therapy: Secondary | ICD-10-CM | POA: Diagnosis not present

## 2018-03-09 ENCOUNTER — Encounter (HOSPITAL_COMMUNITY)
Admission: RE | Admit: 2018-03-09 | Discharge: 2018-03-09 | Disposition: A | Discharge status: Home / Self Care | Payer: Medicare Other | Source: Ambulatory Visit | Attending: Cardiovascular Disease | Admitting: Cardiovascular Disease

## 2018-03-09 DIAGNOSIS — Z79899 Other long term (current) drug therapy: Secondary | ICD-10-CM | POA: Diagnosis not present

## 2018-03-09 DIAGNOSIS — Z87891 Personal history of nicotine dependence: Secondary | ICD-10-CM | POA: Diagnosis not present

## 2018-03-09 DIAGNOSIS — E785 Hyperlipidemia, unspecified: Secondary | ICD-10-CM | POA: Diagnosis not present

## 2018-03-09 DIAGNOSIS — Z955 Presence of coronary angioplasty implant and graft: Secondary | ICD-10-CM

## 2018-03-09 DIAGNOSIS — I509 Heart failure, unspecified: Secondary | ICD-10-CM | POA: Diagnosis not present

## 2018-03-09 DIAGNOSIS — I110 Hypertensive heart disease with heart failure: Secondary | ICD-10-CM | POA: Diagnosis not present

## 2018-03-13 NOTE — Telephone Encounter (Signed)
Please advise 

## 2018-03-13 NOTE — Telephone Encounter (Signed)
Can you check on this please?

## 2018-03-14 ENCOUNTER — Encounter (HOSPITAL_COMMUNITY)
Admission: RE | Admit: 2018-03-14 | Discharge: 2018-03-14 | Disposition: A | Discharge status: Home / Self Care | Payer: Medicare Other | Source: Ambulatory Visit | Attending: Cardiovascular Disease | Admitting: Cardiovascular Disease

## 2018-03-14 ENCOUNTER — Other Ambulatory Visit: Payer: Self-pay | Admitting: Internal Medicine

## 2018-03-14 VITALS — Ht 66.25 in | Wt 209.0 lb

## 2018-03-14 DIAGNOSIS — Z955 Presence of coronary angioplasty implant and graft: Secondary | ICD-10-CM

## 2018-03-14 DIAGNOSIS — Z79899 Other long term (current) drug therapy: Secondary | ICD-10-CM | POA: Diagnosis not present

## 2018-03-14 DIAGNOSIS — E785 Hyperlipidemia, unspecified: Secondary | ICD-10-CM | POA: Diagnosis not present

## 2018-03-14 DIAGNOSIS — Z87891 Personal history of nicotine dependence: Secondary | ICD-10-CM | POA: Diagnosis not present

## 2018-03-14 DIAGNOSIS — I110 Hypertensive heart disease with heart failure: Secondary | ICD-10-CM | POA: Diagnosis not present

## 2018-03-14 DIAGNOSIS — Z720 Tobacco use: Secondary | ICD-10-CM

## 2018-03-14 DIAGNOSIS — R0609 Other forms of dyspnea: Principal | ICD-10-CM

## 2018-03-14 DIAGNOSIS — I509 Heart failure, unspecified: Secondary | ICD-10-CM | POA: Diagnosis not present

## 2018-03-14 DIAGNOSIS — R0600 Dyspnea, unspecified: Secondary | ICD-10-CM

## 2018-03-14 NOTE — Telephone Encounter (Signed)
Pt calling she says she also needs a referral to pulm. In addition to the order for the test  cb is cell [phone]

## 2018-03-14 NOTE — Telephone Encounter (Signed)
Noted  

## 2018-03-14 NOTE — Telephone Encounter (Signed)
Another order for pulmonary function studies placed

## 2018-03-15 NOTE — Telephone Encounter (Signed)
Okay for a referral?

## 2018-03-16 ENCOUNTER — Other Ambulatory Visit: Payer: Self-pay

## 2018-03-16 ENCOUNTER — Encounter (HOSPITAL_COMMUNITY)
Admission: RE | Admit: 2018-03-16 | Discharge: 2018-03-16 | Disposition: A | Discharge status: Home / Self Care | Payer: Medicare Other | Source: Ambulatory Visit | Attending: Cardiovascular Disease | Admitting: Cardiovascular Disease

## 2018-03-16 DIAGNOSIS — Z955 Presence of coronary angioplasty implant and graft: Secondary | ICD-10-CM | POA: Diagnosis not present

## 2018-03-16 DIAGNOSIS — R0689 Other abnormalities of breathing: Secondary | ICD-10-CM

## 2018-03-16 DIAGNOSIS — E785 Hyperlipidemia, unspecified: Secondary | ICD-10-CM | POA: Diagnosis not present

## 2018-03-16 DIAGNOSIS — I509 Heart failure, unspecified: Secondary | ICD-10-CM | POA: Diagnosis not present

## 2018-03-16 DIAGNOSIS — I110 Hypertensive heart disease with heart failure: Secondary | ICD-10-CM | POA: Diagnosis not present

## 2018-03-16 DIAGNOSIS — Z87891 Personal history of nicotine dependence: Secondary | ICD-10-CM | POA: Diagnosis not present

## 2018-03-16 DIAGNOSIS — Z79899 Other long term (current) drug therapy: Secondary | ICD-10-CM | POA: Diagnosis not present

## 2018-03-16 NOTE — Telephone Encounter (Signed)
OK for Pulmonary referral per patient request

## 2018-03-19 ENCOUNTER — Encounter (HOSPITAL_COMMUNITY)
Admission: RE | Admit: 2018-03-19 | Discharge: 2018-03-19 | Disposition: A | Discharge status: Home / Self Care | Payer: Medicare Other | Source: Ambulatory Visit | Attending: Cardiovascular Disease | Admitting: Cardiovascular Disease

## 2018-03-19 DIAGNOSIS — Z87891 Personal history of nicotine dependence: Secondary | ICD-10-CM | POA: Insufficient documentation

## 2018-03-19 DIAGNOSIS — I110 Hypertensive heart disease with heart failure: Secondary | ICD-10-CM | POA: Insufficient documentation

## 2018-03-19 DIAGNOSIS — Z79899 Other long term (current) drug therapy: Secondary | ICD-10-CM | POA: Insufficient documentation

## 2018-03-19 DIAGNOSIS — I509 Heart failure, unspecified: Secondary | ICD-10-CM | POA: Insufficient documentation

## 2018-03-19 DIAGNOSIS — Z955 Presence of coronary angioplasty implant and graft: Secondary | ICD-10-CM

## 2018-03-19 DIAGNOSIS — E785 Hyperlipidemia, unspecified: Secondary | ICD-10-CM | POA: Insufficient documentation

## 2018-03-19 NOTE — Progress Notes (Signed)
Cardiology Office Note    Date:  03/20/2018   ID:  Tracy [name], DOB 1946/03/23, MRN [id number]  PCP:  Marletta Lor, MD  Cardiologist: Sherren Mocha, MD  Chief Complaint  Patient presents with  . Follow-up    History of Present Illness:  Tracy [name] is a 72 y.o. female with history of CAD status post BMS to the left circumflex 2007, repeat stenting of the circumflex with a DES platform 10/2017 after an abnormal stress test with inferolateral ischemia and chest pain.  Last saw Dr. Burt Knack 01/04/2018 at which time she was having dyspnea on exertion.  She was getting relief by taking extra Lasix so he increased her Lasix to 40 mg daily and added potassium 10 mEq daily.  She had gained 25 pounds over 18 months which he thought could also be contributing to her symptoms and recommended diet and exercise with weight loss.  She weighed 207 pounds that day. chest x-ray showed no acute abnormality.  Patient comes in today for f/u with her husband.  She is still complaining of dyspnea on exertion.  Finishes cardiac rehab tomorrow but wants to do the maintenance program.  Has an appointment with pulmonary next week to be evaluated for possible COPD.  Still feels like she is retaining fluid and some days does not feel like the Lasix is working.  Trying hard to decrease the amount of processed foods in her diet but still getting some.  Brings in blood pressure and pulse readings from cardiac rehab which are all stable.  She says she forgets her afternoon dose of metoprolol at times and is asking if she can take it once a day.    Past Medical History:  Diagnosis Date  . Allergic rhinitis   . CAD (coronary artery disease)    1/19 PCI/DES to [place] for ISR, normal EF.   Marland Kitchen CHF (congestive heart failure) ([place])   . Gout   . Headache(784.0)   . Hyperlipidemia   . Hypertension   . Low back pain   . Menopausal syndrome   . Myocardial infarct ([place]) 2007   hx of  . Overactive bladder      Past Surgical History:  Procedure Laterality Date  . ANGIOPLASTY     stent 2007  . CARDIAC CATHETERIZATION    . COLONOSCOPY    . CORONARY STENT INTERVENTION N/A 11/06/2017   Procedure: CORONARY STENT INTERVENTION;  Surgeon: [name], MD;  Location: [facility];  Service: Cardiovascular;  Laterality: N/A;  . LEFT HEART CATH AND CORONARY ANGIOGRAPHY N/A 11/06/2017   Procedure: LEFT HEART CATH AND CORONARY ANGIOGRAPHY;  Surgeon: [name], MD;  Location: [facility];  Service: Cardiovascular;  Laterality: N/A;  . TONSILLECTOMY      Current Medications: Current Meds  Medication Sig  . allopurinol (ZYLOPRIM) 100 MG tablet Take 1 tablet (100 mg total) by mouth daily.  Marland Kitchen ALPRAZolam (XANAX) 0.25 MG tablet TAKE 1 TABLET TWICE A DAY AS NEEDED FOR ANXIETY  . aspirin EC 81 MG tablet Take 81 mg by mouth daily.  Marland Kitchen atorvastatin (LIPITOR) 40 MG tablet TAKE 1 TABLET DAILY BY MOUTH. PLEASE KEEP UPCOMING APPT FOR FUTURE REFILLS. THANKS  . calcium carbonate (CALCIUM 600) 600 MG TABS tablet Take 600 mg by mouth See admin instructions. Once every 2 weeks  . cetirizine (ZYRTEC) 10 MG tablet Take 10 mg by mouth daily.  . Cholecalciferol (VITAMIN D3 PO) Take 1,000 Units by mouth daily.  Marland Kitchen  clopidogrel (PLAVIX) 75 MG tablet Take 1 tablet (75 mg total) by mouth daily with breakfast.  . Coenzyme Q10 (CO Q 10 PO) Take 300 mg by mouth See admin instructions. Once every 2 weeks.  . Cyanocobalamin (VITAMIN B-12) 5000 MCG SUBL Place 5,000 mcg under the tongue daily.  . furosemide (LASIX) 40 MG tablet Take 1 tablet (40 mg total) by mouth daily.  Marland Kitchen losartan (COZAAR) 100 MG tablet TAKE 1 TABLET (100 MG TOTAL) BY MOUTH DAILY.  . metoprolol tartrate (LOPRESSOR) 100 MG tablet Take 1 tablet (100 mg total) by mouth as directed. Please take 100 mg in the morning and 50 mg at bedtime  . Misc Natural Products (TART CHERRY ADVANCED) CAPS Take 1,000 capsules by mouth daily.   . nitroGLYCERIN  (NITROSTAT) 0.4 MG SL tablet Place 1 tablet (0.4 mg total) under the tongue every 5 (five) minutes as needed for chest pain.  . Omega-3 Fatty Acids (FISH OIL OMEGA-3 PO) Take 1,400 mg by mouth See admin instructions. Once every couple weeks  . pantoprazole (PROTONIX) 40 MG tablet TAKE 1 TABLET (40 MG TOTAL) BY MOUTH DAILY.  Marland Kitchen potassium chloride (K-DUR) 10 MEQ tablet Take 1 tablet (10 mEq total) by mouth daily.  . vitamin E 200 UNIT capsule Take 200 Units by mouth daily.  . [DISCONTINUED] metoprolol tartrate (LOPRESSOR) 100 MG tablet TAKE 1 TABLET BY MOUTH TWICE A DAY     Allergies:   Erythromycin and Sulfamethoxazole   Social History   Socioeconomic History  . Marital status: Married    Spouse name: Not on file  . Number of children: Not on file  . Years of education: Not on file  . Highest education level: Not on file  Occupational History  . Occupation: direct Civil engineer, contracting  Social Needs  . Financial resource strain: Not on file  . Food insecurity:    Worry: Not on file    Inability: Not on file  . Transportation needs:    Medical: No    Non-medical: No  Tobacco Use  . Smoking status: Former Smoker    Last attempt to quit: 07/01/2012    Years since quitting: 5.7  . Smokeless tobacco: Never Used  Substance and Sexual Activity  . Alcohol use: No  . Drug use: No  . Sexual activity: Not on file  Lifestyle  . Physical activity:    Days per week: 0 days    Minutes per session: 0 min  . Stress: Only a little  Relationships  . Social connections:    Talks on phone: Not on file    Gets together: Not on file    Attends religious service: Not on file    Active member of club or organization: Not on file    Attends meetings of clubs or organizations: Not on file    Relationship status: Not on file  Other Topics Concern  . Not on file  Social History Narrative  . Not on file     Family History:  The patient's family history includes Colon cancer (age of onset:  61) in her mother; Other in her unknown relative. She was adopted.   ROS:   Please see the history of present illness.    Review of Systems  Constitution: Positive for weight gain.  HENT: Negative.   Eyes: Negative.   Cardiovascular: Positive for dyspnea on exertion.  Respiratory: Negative.   Hematologic/Lymphatic: Negative.   Musculoskeletal: Negative.  Negative for joint pain.  Gastrointestinal: Negative.  Genitourinary: Negative.   Neurological: Negative.    All other systems reviewed and are negative.   PHYSICAL EXAM:   VS:  BP 96/62   Pulse 81   Ht '5\' 5"'$  (1.651 m)   Wt 208 lb (94.3 kg)   SpO2 97%   BMI 34.61 kg/m   Physical Exam  GEN: Well nourished, well developed, in no acute distress  Neck: no JVD, carotid bruits, or masses Cardiac:RRR; no murmurs, rubs, or gallops  Respiratory:  clear to auscultation bilaterally, normal work of breathing GI: soft, nontender, nondistended, + BS Ext: without cyanosis, clubbing, or edema, Good distal pulses bilaterally Neuro:  Alert and Oriented x 3 Psych: euthymic mood, full affect  Wt Readings from Last 3 Encounters:  03/20/18 208 lb (94.3 kg)  03/14/18 208 lb 15.9 oz (94.8 kg)  02/22/18 205 lb (93 kg)      Studies/Labs Reviewed:   EKG:  EKG is not ordered today.    Recent Labs: 07/31/2017: ALT 37 09/04/2017: NT-Pro BNP 480 11/03/2017: Hemoglobin 11.8; Platelets 213 11/14/2017: BUN 28; Creatinine, Ser 1.20; Potassium 4.6; Sodium 143   Lipid Panel    Component Value Date/Time   CHOL 154 09/13/2016 0738   TRIG 175 (H) 09/13/2016 0738   HDL 31 (L) 09/13/2016 0738   CHOLHDL 5.0 (H) 09/13/2016 0738   VLDL 35 (H) 09/13/2016 0738   LDLCALC 88 09/13/2016 0738   LDLDIRECT 111.0 01/05/2015 1019    Additional studies/ records that were reviewed today include:  11/06/2017  1:59 PM EST    Coronary Findings    Diagnostic  Dominance: Right  Left Main  Vessel was injected. Vessel is large.  Left Anterior Descending    Vessel was injected. Vessel is large. The vessel is moderately tortuous. Overall appearance is not significantly different than prior studies in 2011 and 2012.  Ost LAD lesion 30% stenosed  Ost LAD lesion is 30% stenosed. The lesion is eccentric. The lesion is calcified.  Prox LAD to Mid LAD lesion 50% stenosed  Prox LAD to Mid LAD lesion is 50% stenosed. The lesion is eccentric. The lesion is moderately calcified.  Mid LAD lesion 50% stenosed  Mid LAD lesion is 50% stenosed.  Mid LAD to Dist LAD lesion 40% stenosed  Mid LAD to Dist LAD lesion is 40% stenosed.  First Diagonal Branch  Vessel is small in size.  Second Diagonal Branch  Vessel is large in size.  Third Diagonal Branch  Vessel is small in size.  Left Circumflex  Vessel was injected. Vessel is large.  Ost Cx lesion 25% stenosed  Ost Cx lesion is 25% stenosed.  Prox Cx lesion 25% stenosed  Prox Cx lesion is 25% stenosed.  Prox Cx to Mid Cx lesion 60% stenosed  Prox Cx to Mid Cx lesion is 60% stenosed. The lesion was previously treated using a bare metal stent over 2 years ago. Previously placed stent displays restenosis. Proximal stent has in-stent restenosis of up to 50-60%.  First Obtuse Marginal Branch  Vessel is small in size.  Second Obtuse Marginal Branch  Vessel is small in size.  Third Obtuse Marginal Branch  Vessel is large in size.  Right Coronary Artery  Vessel was injected. Vessel is large. The vessel is mildly tortuous.  Prox RCA to Mid RCA lesion 25% stenosed  Prox RCA to Mid RCA lesion is 25% stenosed. The lesion is eccentric. The lesion is calcified.  Intervention    Prox Cx lesion  Stent (Also treats lesions: Prox  Cx to Mid Cx)  A drug-eluting stent was successfully placed using a STENT SYNERGY DES 3X20. Maximum pressure: 14 atm. Stent strut is well apposed. Stent overlaps previously placed stent.  Post-Intervention Lesion Assessment  There is no residual stenosis post intervention.  Prox Cx to Mid  Cx lesion  Stent (Also treats lesions: Prox Cx)  A drug-eluting stent was successfully placed using a STENT SYNERGY DES 3X20. Maximum pressure: 14 atm. Stent strut is well apposed. Stent overlaps previously placed stent.  Post-Intervention Lesion Assessment  There is no residual stenosis post intervention.  Left Heart    Left Ventricle LV end diastolic pressure is mildly elevated. LVEDP 20-22 mmHg.   Echo 09-29-2017: Left ventricle:  The cavity size was normal. Wall thickness was increased in a pattern of mild LVH. Systolic function was normal. The estimated ejection fraction was in the range of 50% to 55%. Wall motion was normal; there were no regional wall motion abnormalities. Doppler parameters are consistent with abnormal left ventricular relaxation (grade 1 diastolic dysfunction).   ------------------------------------------------------------------- Myoview 10-13-2017: Study Highlights       Nuclear stress EF: 51%.  There was no ST segment deviation noted during stress.  Defect 1: There is a medium defect of moderate severity present in the basal inferior, basal inferolateral, mid inferior and mid inferolateral location.  This is an intermediate risk study.  Findings consistent with ischemia.  The left ventricular ejection fraction is mildly decreased (45-54%).   There is a medium size, moderate severity reversible defect in the basal and mid inferior and inferolateral walls consistent with ischemia (SDS =4).          ASSESSMENT:    1. Coronary artery disease involving native coronary artery of native heart without angina pectoris   2. Essential hypertension   3. Chronic combined systolic and diastolic heart failure (HCC)   4. Class 1 obesity due to excess calories with serious comorbidity and body mass index (BMI) of 34.0 to 34.9 in adult      PLAN:  In order of problems listed above:  CAD status post BMS to the circumflex in 2007, DES to the circumflex  10/25/2017 with residual nonobstructive disease in the LAD and RCA.  Continue Plavix and aspirin on Lipitor  Essential hypertension blood pressure runs on the low side.  She is forgetting her afternoon metoprolol.  We will decrease afternoon dose to 50 mg.  I will see her back and possibly stop the afternoon dose and change her to Toprol-XL.  She just got a new prescription so wants to stay on current twice daily dosing until she finishes it.  Chronic combined systolic and diastolic CHF with improvement in LV function EF 50 to 55% with grade 1 DD on echo 09/2017.  Lasix increased to 40 mg daily by Dr. Burt Knack 12/2017-has had elevated creatinine on higher dose Lasix in the past.  Will need to check renal function today.   Obesity patient finishes cardiac rehab tomorrow once to do the maintenance dose.  We will put an order in for this.  Also recommend weight loss program such as weight watchers.  Medication Adjustments/Labs and Tests Ordered: Current medicines are reviewed at length with the patient today.  Concerns regarding medicines are outlined above.  Medication changes, Labs and Tests ordered today are listed in the Patient Instructions below. Patient Instructions   Medication Instructions: Your physician has recommended you make the following change in your medication:  DECREASE: Metoprolol to 100 mg by mouth in  the morning and 50 mg at bedtime   Labwork:  TODAY: BMET  Procedures/Testing:   Follow-Up: Your physician recommends that you schedule a follow-up appointment in: 6 weeks with Ermalinda Barrios PA-C and see Dr.Cooper in 6 months   Any Additional Special Instructions Will Be Listed Below (If Applicable).  Your physician recommends that you check out a weight loss program such as weight watchers  Two Gram Sodium Diet 2000 mg  What is Sodium? Sodium is a mineral found naturally in many foods. The most significant source of sodium in the diet is table salt, which is about 40%  sodium.  Processed, convenience, and preserved foods also contain a large amount of sodium.  The body needs only 500 mg of sodium daily to function,  A normal diet provides more than enough sodium even if you do not use salt.  Why Limit Sodium? A build up of sodium in the body can cause thirst, increased blood pressure, shortness of breath, and water retention.  Decreasing sodium in the diet can reduce edema and risk of heart attack or stroke associated with high blood pressure.  Keep in mind that there are many other factors involved in these health problems.  Heredity, obesity, lack of exercise, cigarette smoking, stress and what you eat all play a role.  General Guidelines:  Do not add salt at the table or in cooking.  One teaspoon of salt contains over 2 grams of sodium.  Read food labels  Avoid processed and convenience foods  Ask your dietitian before eating any foods not dicussed in the menu planning guidelines  Consult your physician if you wish to use a salt substitute or a sodium containing medication such as antacids.  Limit milk and milk products to 16 oz (2 cups) per day.  Shopping Hints:  READ LABELS!! "Dietetic" does not necessarily mean low sodium.  Salt and other sodium ingredients are often added to foods during processing.   Menu Planning Guidelines Food Group Choose More Often Avoid  Beverages (see also the milk group All fruit juices, low-sodium, salt-free vegetables juices, low-sodium carbonated beverages Regular vegetable or tomato juices, commercially softened water used for drinking or cooking  Breads and Cereals Enriched white, wheat, rye and pumpernickel bread, hard rolls and dinner rolls; muffins, cornbread and waffles; most dry cereals, cooked cereal without added salt; unsalted crackers and breadsticks; low sodium or homemade bread crumbs Bread, rolls and crackers with salted tops; quick breads; instant hot cereals; pancakes; commercial bread stuffing;  self-rising flower and biscuit mixes; regular bread crumbs or cracker crumbs  Desserts and Sweets Desserts and sweets mad with mild should be within allowance Instant pudding mixes and cake mixes  Fats Butter or margarine; vegetable oils; unsalted salad dressings, regular salad dressings limited to 1 Tbs; light, sour and heavy cream Regular salad dressings containing bacon fat, bacon bits, and salt pork; snack dips made with instant soup mixes or processed cheese; salted nuts  Fruits Most fresh, frozen and canned fruits Fruits processed with salt or sodium-containing ingredient (some dried fruits are processed with sodium sulfites        Vegetables Fresh, frozen vegetables and low- sodium canned vegetables Regular canned vegetables, sauerkraut, pickled vegetables, and others prepared in brine; frozen vegetables in sauces; vegetables seasoned with ham, bacon or salt pork  Condiments, Sauces, Miscellaneous  Salt substitute with physician's approval; pepper, herbs, spices; vinegar, lemon or lime juice; hot pepper sauce; garlic powder, onion powder, low sodium soy sauce (1 Tbs.); low sodium  condiments (ketchup, chili sauce, mustard) in limited amounts (1 tsp.) fresh ground horseradish; unsalted tortilla chips, pretzels, potato chips, popcorn, salsa (1/4 cup) Any seasoning made with salt including garlic salt, celery salt, onion salt, and seasoned salt; sea salt, rock salt, kosher salt; meat tenderizers; monosodium glutamate; mustard, regular soy sauce, barbecue, sauce, chili sauce, teriyaki sauce, steak sauce, Worcestershire sauce, and most flavored vinegars; canned gravy and mixes; regular condiments; salted snack foods, olives, picles, relish, horseradish sauce, catsup   Food preparation: Try these seasonings Meats:    Pork Sage, onion Serve with applesauce  Chicken Poultry seasoning, thyme, parsley Serve with cranberry sauce  Lamb Curry powder, rosemary, garlic, thyme Serve with mint sauce or jelly    Veal Marjoram, basil Serve with current jelly, cranberry sauce  Beef Pepper, bay leaf Serve with dry mustard, unsalted chive butter  Fish [place] leaf, dill Serve with unsalted lemon butter, unsalted parsley butter  Vegetables:    Asparagus Lemon juice   Broccoli Lemon juice   Carrots Mustard dressing parsley, mint, nutmeg, glazed with unsalted butter and sugar   Green beans Marjoram, lemon juice, nutmeg,dill seed   Tomatoes Basil, marjoram, onion   Spice /blend for [employer]" 4 tsp ground thyme 1 tsp ground sage 3 tsp ground rosemary 4 tsp ground marjoram   Test your knowledge 1. A product that says "Salt Free" may still contain sodium. True or False 2. Garlic Powder and Hot Pepper Sauce an be used as alternative seasonings.True or False 3. Processed foods have more sodium than fresh foods.  True or False 4. Canned Vegetables have less sodium than froze True or False  WAYS TO DECREASE YOUR SODIUM INTAKE 1. Avoid the use of added salt in cooking and at the table.  Table salt (and other prepared seasonings which contain salt) is probably one of the greatest sources of sodium in the diet.  Unsalted foods can gain flavor from the sweet, sour, and butter taste sensations of herbs and spices.  Instead of using salt for seasoning, try the following seasonings with the foods listed.  Remember: how you use them to enhance natural food flavors is limited only by your creativity... Allspice-Meat, fish, eggs, fruit, peas, red and yellow vegetables Almond Extract-Fruit baked goods Anise Seed-Sweet breads, fruit, carrots, beets, cottage cheese, cookies (tastes like licorice) Basil-Meat, fish, eggs, vegetables, rice, vegetables salads, soups, sauces Bay Leaf-Meat, fish, stews, poultry Burnet-Salad, vegetables (cucumber-like flavor) Caraway Seed-Bread, cookies, cottage cheese, meat, vegetables, cheese, rice Cardamon-Baked goods, fruit, soups Celery Powder or seed-Salads, salad dressings, sauces,  meatloaf, soup, bread.Do not use  celery salt Chervil-Meats, salads, fish, eggs, vegetables, cottage cheese (parsley-like flavor) Chili Power-Meatloaf, chicken cheese, corn, eggplant, egg dishes Chives-Salads cottage cheese, egg dishes, soups, vegetables, sauces Cilantro-Salsa, casseroles Cinnamon-Baked goods, fruit, pork, lamb, chicken, carrots Cloves-Fruit, baked goods, fish, pot roast, green beans, beets, carrots Coriander-Pastry, cookies, meat, salads, cheese (lemon-orange flavor) Cumin-Meatloaf, fish,cheese, eggs, cabbage,fruit pie (caraway flavor) [employer], fruit, eggs, fish, poultry, cottage cheese, vegetables Dill Seed-Meat, cottage cheese, poultry, vegetables, fish, salads, bread Fennel Seed-Bread, cookies, apples, pork, eggs, fish, beets, cabbage, cheese, Licorice-like flavor Garlic-(buds or powder) Salads, meat, poultry, fish, bread, butter, vegetables, potatoes.Do not  use garlic salt Ginger-Fruit, vegetables, baked goods, meat, fish, poultry Horseradish Root-Meet, vegetables, butter Lemon Juice or Extract-Vegetables, fruit, tea, baked goods, fish salads Mace-Baked goods fruit, vegetables, fish, poultry (taste like nutmeg) Maple Extract-Syrups Marjoram-Meat, chicken, fish, vegetables, breads, green salads (taste like Sage) Mint-Tea, lamb, sherbet, vegetables, desserts, carrots, cabbage Mustard, Dry  or Seed-Cheese, eggs, meats, vegetables, poultry Nutmeg-Baked goods, fruit, chicken, eggs, vegetables, desserts Onion Powder-Meat, fish, poultry, vegetables, cheese, eggs, bread, rice salads (Do not use   Onion salt) Orange Extract-Desserts, baked goods Oregano-Pasta, eggs, cheese, onions, pork, lamb, fish, chicken, vegetables, green salads Paprika-Meat, fish, poultry, eggs, cheese, vegetables Parsley Flakes-Butter, vegetables, meat fish, poultry, eggs, bread, salads (certain forms may   Contain sodium Pepper-Meat fish, poultry, vegetables, eggs Peppermint  Extract-Desserts, baked goods Poppy Seed-Eggs, bread, cheese, fruit dressings, baked goods, noodles, vegetables, cottage  [employer], poultry, meat, fish, cauliflower, turnips,eggs bread Saffron-Rice, bread, veal, chicken, fish, eggs Sage-Meat, fish, poultry, onions, eggplant, tomateos, pork, stews Savory-Eggs, salads, poultry, meat, rice, vegetables, soups, pork Tarragon-Meat, poultry, fish, eggs, butter, vegetables (licorice-like flavor)  Thyme-Meat, poultry, fish, eggs, vegetables, (clover-like flavor), sauces, soups Tumeric-Salads, butter, eggs, fish, rice, vegetables (saffron-like flavor) Vanilla Extract-Baked goods, candy Vinegar-Salads, vegetables, meat marinades Walnut Extract-baked goods, candy  2. Choose your Foods Wisely   The following is a list of foods to avoid which are high in sodium:  Meats-Avoid all smoked, canned, salt cured, dried and kosher meat and fish as well as Anchovies   [employer] meats:Bologna, Liverwurst, Pastrami Canned meat or fish  Marinated herring Caviar    Pepperoni Corned Beef   Pizza Dried chipped beef  Salami Frozen breaded fish or meat Salt pork Frankfurters or hot dogs  Sardines Gefilte fish   Sausage Ham (boiled ham, Proscuitto Smoked butt    spiced ham)   Spam      TV Dinners Vegetables Canned vegetables (Regular) Relish Canned mushrooms  Sauerkraut Olives    Tomato juice Pickles  Bakery and Dessert Products Canned puddings  Cream pies Cheesecake   Decorated cakes Cookies  Beverages/Juices Tomato juice, regular  Gatorade   V-8 vegetable juice, regular  Breads and Cereals Biscuit mixes   Salted potato chips, corn chips, pretzels Bread stuffing mixes  Salted crackers and rolls Pancake and waffle mixes Self-rising flour  Seasonings Accent    Meat sauces Barbecue sauce  Meat tenderizer Catsup    Monosodium glutamate (MSG) Celery salt   Onion salt Chili sauce   Prepared  mustard Garlic salt   Salt, seasoned salt, sea salt Gravy mixes   Soy sauce Horseradish   Steak sauce Ketchup   Tartar sauce Lite salt    Teriyaki sauce Marinade mixes   Worcestershire sauce  Others Baking powder   Cocoa and cocoa mixes Baking soda   Commercial casserole mixes Candy-caramels, chocolate  Dehydrated soups    Bars, fudge,nougats  Instant rice and pasta mixes Canned broth or soup  Maraschino cherries Cheese, aged and processed cheese and cheese spreads  Learning Assessment Quiz  Indicated T (for True) or F (for False) for each of the following statements:  1. _____ Fresh fruits and vegetables and unprocessed grains are generally low in sodium 2. _____ Water may contain a considerable amount of sodium, depending on the source 3. _____ You can always tell if a food is high in sodium by tasting it 4. _____ Certain laxatives my be high in sodium and should be avoided unless prescribed   by a physician or pharmacist 5. _____ Salt substitutes may be used freely by anyone on a sodium restricted diet 6. _____ Sodium is present in table salt, food additives and as a natural component of   most foods 7. _____ Table salt is approximately 90% sodium 8. _____ Limiting sodium intake may help prevent excess  fluid accumulation in the body 9. _____ On a sodium-restricted diet, seasonings such as bouillon soy sauce, and    cooking wine should be used in place of table salt 10. _____ On an ingredient list, a product which lists monosodium glutamate as the first   ingredient is an appropriate food to include on a low sodium diet  Circle the best answer(s) to the following statements (Hint: there may be more than one correct answer)  11. On a low-sodium diet, some acceptable snack items are:    A. Olives  F. Bean dip   K. Grapefruit juice    B. Salted Pretzels G. Commercial Popcorn   L. Canned peaches    C. Carrot Sticks  H. Bouillon   M. Unsalted nuts   D. [place] fries  I. Peanut  butter crackers N. Salami   E. Sweet pickles J. Tomato Juice   O. Pizza  12.  Seasonings that may be used freely on a reduced - sodium diet include   A. Lemon wedges F.Monosodium glutamate K. Celery seed    B.Soysauce   G. Pepper   L. Mustard powder   C. Sea salt  H. Cooking wine  M. Onion flakes   D. Vinegar  E. Prepared horseradish N. Salsa   E. Sage   J. Worcestershire sauce  O. Chutney     If you need a refill on your cardiac medications before your next appointment, please call your pharmacy.      Sumner Boast, PA-C  03/20/2018 8:28 AM    [facility] [place], [address], [place]  [address] Phone: [phone]; Fax: [phone]

## 2018-03-20 ENCOUNTER — Encounter: Payer: Self-pay | Admitting: Physician Assistant

## 2018-03-20 ENCOUNTER — Ambulatory Visit (INDEPENDENT_AMBULATORY_CARE_PROVIDER_SITE_OTHER): Payer: Medicare Other | Admitting: Physician Assistant

## 2018-03-20 VITALS — BP 110/60 | HR 90 | Ht 65.0 in | Wt 204.0 lb

## 2018-03-20 DIAGNOSIS — Z6834 Body mass index (BMI) 34.0-34.9, adult: Secondary | ICD-10-CM | POA: Diagnosis not present

## 2018-03-20 DIAGNOSIS — E6609 Other obesity due to excess calories: Secondary | ICD-10-CM | POA: Diagnosis not present

## 2018-03-20 DIAGNOSIS — I2510 Atherosclerotic heart disease of native coronary artery without angina pectoris: Secondary | ICD-10-CM

## 2018-03-20 DIAGNOSIS — I5042 Chronic combined systolic (congestive) and diastolic (congestive) heart failure: Secondary | ICD-10-CM | POA: Diagnosis not present

## 2018-03-20 DIAGNOSIS — I10 Essential (primary) hypertension: Secondary | ICD-10-CM | POA: Diagnosis not present

## 2018-03-20 DIAGNOSIS — E669 Obesity, unspecified: Secondary | ICD-10-CM | POA: Insufficient documentation

## 2018-03-20 LAB — BASIC METABOLIC PANEL
BUN/Creatinine Ratio: 23 (ref 12–28)
BUN: 27 mg/dL (ref 8–27)
CO2: 24 mmol/L (ref 20–29)
Calcium: 9.2 mg/dL (ref 8.7–10.3)
Chloride: 107 mmol/L — ABNORMAL HIGH (ref 96–106)
Creatinine, Ser: 1.17 mg/dL — ABNORMAL HIGH (ref 0.57–1.00)
GFR calc Af Amer: 55 mL/min/{1.73_m2} — ABNORMAL LOW (ref >59)
GFR calc non Af Amer: 48 mL/min/{1.73_m2} — ABNORMAL LOW (ref >59)
Glucose: 158 mg/dL — ABNORMAL HIGH (ref 65–99)
Potassium: 4.3 mmol/L (ref 3.5–5.2)
Sodium: 143 mmol/L (ref 134–144)

## 2018-03-20 MED ORDER — METOPROLOL TARTRATE 100 MG PO TABS: 100.0000 mg | ORAL_TABLET | ORAL | 3 refills | Status: DC

## 2018-03-20 NOTE — Patient Instructions (Addendum)
Medication Instructions: Your physician has recommended you make the following change in your medication:  DECREASE: Metoprolol to 100 mg by mouth in the morning and 50 mg at bedtime   Labwork:  TODAY: BMET  Procedures/Testing:   Follow-Up: Your physician recommends that you schedule a follow-up appointment in: 6 weeks with Ermalinda Barrios PA-C and see Dr.Cooper in 6 months   Any Additional Special Instructions Will Be Listed Below (If Applicable).  Your physician recommends that you check out a weight loss program such as weight watchers  Two Gram Sodium Diet 2000 mg  What is Sodium? Sodium is a mineral found naturally in many foods. The most significant source of sodium in the diet is table salt, which is about 40% sodium.  Processed, convenience, and preserved foods also contain a large amount of sodium.  The body needs only 500 mg of sodium daily to function,  A normal diet provides more than enough sodium even if you do not use salt.  Why Limit Sodium? A build up of sodium in the body can cause thirst, increased blood pressure, shortness of breath, and water retention.  Decreasing sodium in the diet can reduce edema and risk of heart attack or stroke associated with high blood pressure.  Keep in mind that there are many other factors involved in these health problems.  Heredity, obesity, lack of exercise, cigarette smoking, stress and what you eat all play a role.  General Guidelines:  Do not add salt at the table or in cooking.  One teaspoon of salt contains over 2 grams of sodium.  Read food labels  Avoid processed and convenience foods  Ask your dietitian before eating any foods not dicussed in the menu planning guidelines  Consult your physician if you wish to use a salt substitute or a sodium containing medication such as antacids.  Limit milk and milk products to 16 oz (2 cups) per day.  Shopping Hints:  READ LABELS!! "Dietetic" does not necessarily mean low  sodium.  Salt and other sodium ingredients are often added to foods during processing.   Menu Planning Guidelines Food Group Choose More Often Avoid  Beverages (see also the milk group All fruit juices, low-sodium, salt-free vegetables juices, low-sodium carbonated beverages Regular vegetable or tomato juices, commercially softened water used for drinking or cooking  Breads and Cereals Enriched white, wheat, rye and pumpernickel bread, hard rolls and dinner rolls; muffins, cornbread and waffles; most dry cereals, cooked cereal without added salt; unsalted crackers and breadsticks; low sodium or homemade bread crumbs Bread, rolls and crackers with salted tops; quick breads; instant hot cereals; pancakes; commercial bread stuffing; self-rising flower and biscuit mixes; regular bread crumbs or cracker crumbs  Desserts and Sweets Desserts and sweets mad with mild should be within allowance Instant pudding mixes and cake mixes  Fats Butter or margarine; vegetable oils; unsalted salad dressings, regular salad dressings limited to 1 Tbs; light, sour and heavy cream Regular salad dressings containing bacon fat, bacon bits, and salt pork; snack dips made with instant soup mixes or processed cheese; salted nuts  Fruits Most fresh, frozen and canned fruits Fruits processed with salt or sodium-containing ingredient (some dried fruits are processed with sodium sulfites        Vegetables Fresh, frozen vegetables and low- sodium canned vegetables Regular canned vegetables, sauerkraut, pickled vegetables, and others prepared in brine; frozen vegetables in sauces; vegetables seasoned with ham, bacon or salt pork  Condiments, Sauces, Miscellaneous  Salt substitute with physician's approval; pepper,  herbs, spices; vinegar, lemon or lime juice; hot pepper sauce; garlic powder, onion powder, low sodium soy sauce (1 Tbs.); low sodium condiments (ketchup, chili sauce, mustard) in limited amounts (1 tsp.) fresh ground  horseradish; unsalted tortilla chips, pretzels, potato chips, popcorn, salsa (1/4 cup) Any seasoning made with salt including garlic salt, celery salt, onion salt, and seasoned salt; sea salt, rock salt, kosher salt; meat tenderizers; monosodium glutamate; mustard, regular soy sauce, barbecue, sauce, chili sauce, teriyaki sauce, steak sauce, Worcestershire sauce, and most flavored vinegars; canned gravy and mixes; regular condiments; salted snack foods, olives, picles, relish, horseradish sauce, catsup   Food preparation: Try these seasonings Meats:    Pork Sage, onion Serve with applesauce  Chicken Poultry seasoning, thyme, parsley Serve with cranberry sauce  Lamb Curry powder, rosemary, garlic, thyme Serve with mint sauce or jelly  Veal Marjoram, basil Serve with current jelly, cranberry sauce  Beef Pepper, bay leaf Serve with dry mustard, unsalted chive butter  Fish [place] leaf, dill Serve with unsalted lemon butter, unsalted parsley butter  Vegetables:    Asparagus Lemon juice   Broccoli Lemon juice   Carrots Mustard dressing parsley, mint, nutmeg, glazed with unsalted butter and sugar   Green beans Marjoram, lemon juice, nutmeg,dill seed   Tomatoes Basil, marjoram, onion   Spice /blend for [employer]" 4 tsp ground thyme 1 tsp ground sage 3 tsp ground rosemary 4 tsp ground marjoram   Test your knowledge 1. A product that says "Salt Free" may still contain sodium. True or False 2. Garlic Powder and Hot Pepper Sauce an be used as alternative seasonings.True or False 3. Processed foods have more sodium than fresh foods.  True or False 4. Canned Vegetables have less sodium than froze True or False  WAYS TO DECREASE YOUR SODIUM INTAKE 1. Avoid the use of added salt in cooking and at the table.  Table salt (and other prepared seasonings which contain salt) is probably one of the greatest sources of sodium in the diet.  Unsalted foods can gain flavor from the sweet, sour, and butter taste  sensations of herbs and spices.  Instead of using salt for seasoning, try the following seasonings with the foods listed.  Remember: how you use them to enhance natural food flavors is limited only by your creativity... Allspice-Meat, fish, eggs, fruit, peas, red and yellow vegetables Almond Extract-Fruit baked goods Anise Seed-Sweet breads, fruit, carrots, beets, cottage cheese, cookies (tastes like licorice) Basil-Meat, fish, eggs, vegetables, rice, vegetables salads, soups, sauces Bay Leaf-Meat, fish, stews, poultry Burnet-Salad, vegetables (cucumber-like flavor) Caraway Seed-Bread, cookies, cottage cheese, meat, vegetables, cheese, rice Cardamon-Baked goods, fruit, soups Celery Powder or seed-Salads, salad dressings, sauces, meatloaf, soup, bread.Do not use  celery salt Chervil-Meats, salads, fish, eggs, vegetables, cottage cheese (parsley-like flavor) Chili Power-Meatloaf, chicken cheese, corn, eggplant, egg dishes Chives-Salads cottage cheese, egg dishes, soups, vegetables, sauces Cilantro-Salsa, casseroles Cinnamon-Baked goods, fruit, pork, lamb, chicken, carrots Cloves-Fruit, baked goods, fish, pot roast, green beans, beets, carrots Coriander-Pastry, cookies, meat, salads, cheese (lemon-orange flavor) Cumin-Meatloaf, fish,cheese, eggs, cabbage,fruit pie (caraway flavor) [employer], fruit, eggs, fish, poultry, cottage cheese, vegetables Dill Seed-Meat, cottage cheese, poultry, vegetables, fish, salads, bread Fennel Seed-Bread, cookies, apples, pork, eggs, fish, beets, cabbage, cheese, Licorice-like flavor Garlic-(buds or powder) Salads, meat, poultry, fish, bread, butter, vegetables, potatoes.Do not  use garlic salt Ginger-Fruit, vegetables, baked goods, meat, fish, poultry Horseradish Root-Meet, vegetables, butter Lemon Juice or Extract-Vegetables, fruit, tea, baked goods, fish salads Mace-Baked goods fruit, vegetables, fish, poultry (taste like nutmeg)  Maple  Extract-Syrups Marjoram-Meat, chicken, fish, vegetables, breads, green salads (taste like Sage) Mint-Tea, lamb, sherbet, vegetables, desserts, carrots, cabbage Mustard, Dry or Seed-Cheese, eggs, meats, vegetables, poultry Nutmeg-Baked goods, fruit, chicken, eggs, vegetables, desserts Onion Powder-Meat, fish, poultry, vegetables, cheese, eggs, bread, rice salads (Do not use   Onion salt) Orange Extract-Desserts, baked goods Oregano-Pasta, eggs, cheese, onions, pork, lamb, fish, chicken, vegetables, green salads Paprika-Meat, fish, poultry, eggs, cheese, vegetables Parsley Flakes-Butter, vegetables, meat fish, poultry, eggs, bread, salads (certain forms may   Contain sodium Pepper-Meat fish, poultry, vegetables, eggs Peppermint Extract-Desserts, baked goods Poppy Seed-Eggs, bread, cheese, fruit dressings, baked goods, noodles, vegetables, cottage  [employer], poultry, meat, fish, cauliflower, turnips,eggs bread Saffron-Rice, bread, veal, chicken, fish, eggs Sage-Meat, fish, poultry, onions, eggplant, tomateos, pork, stews Savory-Eggs, salads, poultry, meat, rice, vegetables, soups, pork Tarragon-Meat, poultry, fish, eggs, butter, vegetables (licorice-like flavor)  Thyme-Meat, poultry, fish, eggs, vegetables, (clover-like flavor), sauces, soups Tumeric-Salads, butter, eggs, fish, rice, vegetables (saffron-like flavor) Vanilla Extract-Baked goods, candy Vinegar-Salads, vegetables, meat marinades Walnut Extract-baked goods, candy  2. Choose your Foods Wisely   The following is a list of foods to avoid which are high in sodium:  Meats-Avoid all smoked, canned, salt cured, dried and kosher meat and fish as well as Anchovies   [employer] meats:Bologna, Liverwurst, Pastrami Canned meat or fish  Marinated herring Caviar    Pepperoni Corned Beef   Pizza Dried chipped beef  Salami Frozen breaded fish or meat Salt pork Frankfurters or hot  dogs  Sardines Gefilte fish   Sausage Ham (boiled ham, Proscuitto Smoked butt    spiced ham)   Spam      TV Dinners Vegetables Canned vegetables (Regular) Relish Canned mushrooms  Sauerkraut Olives    Tomato juice Pickles  Bakery and Dessert Products Canned puddings  Cream pies Cheesecake   Decorated cakes Cookies  Beverages/Juices Tomato juice, regular  Gatorade   V-8 vegetable juice, regular  Breads and Cereals Biscuit mixes   Salted potato chips, corn chips, pretzels Bread stuffing mixes  Salted crackers and rolls Pancake and waffle mixes Self-rising flour  Seasonings Accent    Meat sauces Barbecue sauce  Meat tenderizer Catsup    Monosodium glutamate (MSG) Celery salt   Onion salt Chili sauce   Prepared mustard Garlic salt   Salt, seasoned salt, sea salt Gravy mixes   Soy sauce Horseradish   Steak sauce Ketchup   Tartar sauce Lite salt    Teriyaki sauce Marinade mixes   Worcestershire sauce  Others Baking powder   Cocoa and cocoa mixes Baking soda   Commercial casserole mixes Candy-caramels, chocolate  Dehydrated soups    Bars, fudge,nougats  Instant rice and pasta mixes Canned broth or soup  Maraschino cherries Cheese, aged and processed cheese and cheese spreads  Learning Assessment Quiz  Indicated T (for True) or F (for False) for each of the following statements:  1. _____ Fresh fruits and vegetables and unprocessed grains are generally low in sodium 2. _____ Water may contain a considerable amount of sodium, depending on the source 3. _____ You can always tell if a food is high in sodium by tasting it 4. _____ Certain laxatives my be high in sodium and should be avoided unless prescribed   by a physician or pharmacist 5. _____ Salt substitutes may be used freely by anyone on a sodium restricted diet 6. _____ Sodium is present in table salt, food additives and as a natural component of  most foods 7. _____ Table salt is approximately 90%  sodium 8. _____ Limiting sodium intake may help prevent excess fluid accumulation in the body 9. _____ On a sodium-restricted diet, seasonings such as bouillon soy sauce, and    cooking wine should be used in place of table salt 10. _____ On an ingredient list, a product which lists monosodium glutamate as the first   ingredient is an appropriate food to include on a low sodium diet  Circle the best answer(s) to the following statements (Hint: there may be more than one correct answer)  11. On a low-sodium diet, some acceptable snack items are:    A. Olives  F. Bean dip   K. Grapefruit juice    B. Salted Pretzels G. Commercial Popcorn   L. Canned peaches    C. Carrot Sticks  H. Bouillon   M. Unsalted nuts   D. [place] fries  I. Peanut butter crackers N. Salami   E. Sweet pickles J. Tomato Juice   O. Pizza  12.  Seasonings that may be used freely on a reduced - sodium diet include   A. Lemon wedges F.Monosodium glutamate K. Celery seed    B.Soysauce   G. Pepper   L. Mustard powder   C. Sea salt  H. Cooking wine  M. Onion flakes   D. Vinegar  E. Prepared horseradish N. Salsa   E. Sage   J. Worcestershire sauce  O. Chutney     If you need a refill on your cardiac medications before your next appointment, please call your pharmacy.

## 2018-03-20 NOTE — Addendum Note (Signed)
Addended by: Mendel Ryder on: 03/20/2018 08:47 AM   Modules accepted: Orders

## 2018-03-21 ENCOUNTER — Encounter (HOSPITAL_COMMUNITY): Payer: Self-pay

## 2018-03-21 ENCOUNTER — Encounter (HOSPITAL_COMMUNITY)
Admission: RE | Admit: 2018-03-21 | Discharge: 2018-03-21 | Disposition: A | Discharge status: Home / Self Care | Payer: Medicare Other | Source: Ambulatory Visit | Attending: Cardiovascular Disease | Admitting: Cardiovascular Disease

## 2018-03-21 DIAGNOSIS — Z955 Presence of coronary angioplasty implant and graft: Secondary | ICD-10-CM

## 2018-03-23 ENCOUNTER — Encounter (HOSPITAL_COMMUNITY): Payer: Medicare Other

## 2018-03-23 ENCOUNTER — Telehealth: Payer: Self-pay | Admitting: Physician Assistant

## 2018-03-23 NOTE — Telephone Encounter (Signed)
Follow Up: ° ° ° °Returning your call, concerning her lab results. °

## 2018-03-23 NOTE — Telephone Encounter (Signed)
-----   Message from Imogene Burn, PA-C sent at 03/20/2018  3:42 PM EDT ----- Renal function actually better than it spent in a long time.  Still very mildly elevated.  Glucose high at 158.

## 2018-03-23 NOTE — Telephone Encounter (Signed)
Returned pts call and she has been made aware of her lab results. 

## 2018-03-26 ENCOUNTER — Encounter (HOSPITAL_COMMUNITY): Payer: Medicare Other

## 2018-03-28 ENCOUNTER — Encounter (HOSPITAL_COMMUNITY): Payer: Medicare Other

## 2018-03-29 ENCOUNTER — Other Ambulatory Visit: Payer: Self-pay | Admitting: Internal Medicine

## 2018-03-30 NOTE — Progress Notes (Signed)
Discharge Progress Report  Patient Details  Name: Tracy [name] MRN: [id number] Date of Birth: [birth year] Referring Provider:   Flowsheet Row CARDIAC REHAB PHASE II ORIENTATION from 12/19/2017 in [place]  Referring Provider  Sherren Mocha MD       Number of Visits: 36   Reason for Discharge:  Patient has met program and personal goals.  Smoking History:  Social History   Tobacco Use  Smoking Status Former Smoker  . Last attempt to quit: 07/01/2012  . Years since quitting: 5.7  Smokeless Tobacco Never Used    Diagnosis:  Status post coronary artery stent placement 11/06/17  S/P DES CFX   ADL [facility]:   Initial Exercise Prescription: Initial Exercise Prescription - 12/19/17 1000    Date of Initial Exercise RX and Referring Provider          Date  12/19/17    Referring Provider  Sherren Mocha MD        Treadmill          MPH  1.5    Grade  0    Minutes  10    METs  2.15        Recumbant Bike          Level  1.5    Watts  5    Minutes  10    METs  2.2        NuStep          Level  2    SPM  60    Minutes  10    METs  1.5        Prescription Details          Frequency (times per week)  3    Duration  Progress to 30 minutes of continuous aerobic without signs/symptoms of physical distress        Intensity          THRR 40-80% of Max Heartrate  60-121    Ratings of Perceived Exertion  11-13    Perceived Dyspnea  0-4        Progression          Progression  Continue to progress workloads to maintain intensity without signs/symptoms of physical distress.        Resistance Training          Training Prescription  Yes    Weight  2lbs    Reps  10-15           Discharge Exercise Prescription (Final Exercise Prescription Changes): Exercise Prescription Changes - 03/21/18 1200    Response to Exercise          Blood Pressure (Admit)  132/82    Blood Pressure (Exercise)  140/80    Blood Pressure  (Exit)  132/70    Heart Rate (Admit)  87 bpm    Heart Rate (Exercise)  122 bpm    Heart Rate (Exit)  89 bpm    Rating of Perceived Exertion (Exercise)  13    Symptoms  none    Duration  Progress to 30 minutes of  aerobic without signs/symptoms of physical distress    Intensity  THRR unchanged        Progression          Progression  Continue to progress workloads to maintain intensity without signs/symptoms of physical distress.    Average METs  2.8        Resistance Training  Training Prescription  No relaxation day    Time  10 Minutes        Treadmill          MPH  2.3    Grade  1    Minutes  10    METs  3.08        Recumbant Bike          Level  4    Watts  10    Minutes  10    METs  2.4        NuStep          Level  5    SPM  85    Minutes  10    METs  2.4        Home Exercise Plan          Plans to continue exercise at  Home (comment)    Frequency  Add 2 additional days to program exercise sessions.    Initial Home Exercises Provided  01/05/18           Functional Capacity: 6 Minute Walk    6 Minute Walk    Row Name 12/19/17 1031 12/19/17 1056 03/14/18 1149   Phase  no documentation  no documentation  Discharge   Distance  838 feet  no documentation  1000 feet   Distance % Change  no documentation  no documentation  19.33 %   Distance Feet Change  no documentation  no documentation  162 ft   Walk Time  6 minutes  no documentation  4.55 minutes   # of Rest Breaks  0  no documentation  2 1st rest break for 20 seconds and second rest break at 45 seconds   MPH  1.5  no documentation  2.5   METS  2.1  no documentation  2.1   RPE  13  no documentation  13   Perceived Dyspnea   no documentation  no documentation  2   VO2 Peak  7.4  no documentation  7.3   Symptoms  No  no documentation  Yes (comment)   Comments  no documentation  no documentation  mild SOB   Resting HR  94 bpm  no documentation  72 bpm   Resting BP  142/82  no  documentation  124/74   Resting Oxygen Saturation   96 %  no documentation  no documentation   Exercise Oxygen Saturation  during 6 min walk  95 %  no documentation  no documentation   Max Ex. HR  109 bpm  no documentation  114 bpm   Max Ex. BP  160/82  no documentation  117/67   2 Minute Post BP  104/70  120/78  110/66          Psychological, QOL, Others - Outcomes: PHQ 2/9: Depression screen [facility] 2/9 03/21/2018 12/25/2017 10/18/2016 10/05/2016 08/05/2015  Decreased Interest 0 1 0 0 0  Down, Depressed, Hopeless 0 1 0 0 0  PHQ - 2 Score 0 2 0 0 0  Altered sleeping - 1 - - -  Tired, decreased energy - 1 - - -  Change in appetite - 0 - - -  Feeling bad or failure about yourself  - 1 - - -  Trouble concentrating - 1 - - -  Moving slowly or fidgety/restless - 0 - - -  Suicidal thoughts - 0 - - -  PHQ-9 Score - 6 - - -  Difficult  doing work/chores - Somewhat difficult - - -    Quality of Life: Quality of Life - 03/21/18 1538    Quality of Life Scores          Health/Function Pre  18.97 %    Health/Function Post  21.07 %    Health/Function % Change  11.07 %    Socioeconomic Pre  22.5 %    Socioeconomic Post  22.71 %    Socioeconomic % Change   0.93 %    Psych/Spiritual Pre  20.21 %    Psych/Spiritual Post  21.79 %    Psych/Spiritual % Change  7.82 %    Family Pre  22.8 %    Family Post  22.8 %    Family % Change  0 %    GLOBAL Pre  20.62 %    GLOBAL Post  21.81 %    GLOBAL % Change  5.77 %           Personal Goals: Goals established at orientation with interventions provided to work toward goal. Personal Goals and Risk Factors at Admission - 12/19/17 1040    Core Components/Risk Factors/Patient Goals on Admission           Weight Management  Yes;Obesity;Weight Maintenance;Weight Loss    Intervention  Weight Management: Develop a combined nutrition and exercise program designed to reach desired caloric intake, while maintaining appropriate intake of nutrient and fiber,  sodium and fats, and appropriate energy expenditure required for the weight goal.;Weight Management: Provide education and appropriate resources to help participant work on and attain dietary goals.;Weight Management/Obesity: Establish reasonable short term and long term weight goals.;Obesity: Provide education and appropriate resources to help participant work on and attain dietary goals.    Admit Weight  208 lb 1.8 oz (94.4 kg)    Goal Weight: Short Term  200 lb (90.7 kg)    Goal Weight: Long Term  190 lb (86.2 kg)    Expected Outcomes  Short Term: Continue to assess and modify interventions until short term weight is achieved;Long Term: Adherence to nutrition and physical activity/exercise program aimed toward attainment of established weight goal;Weight Maintenance: Understanding of the daily nutrition guidelines, which includes 25-35% calories from fat, 7% or less cal from saturated fats, less than '200mg'$  cholesterol, less than 1.5gm of sodium, & 5 or more servings of fruits and vegetables daily;Weight Loss: Understanding of general recommendations for a balanced deficit meal plan, which promotes 1-2 lb weight loss per week and includes a negative energy balance of [phone] kcal/d;Understanding recommendations for meals to include 15-35% energy as protein, 25-35% energy from fat, 35-60% energy from carbohydrates, less than '200mg'$  of dietary cholesterol, 20-35 gm of total fiber daily;Understanding of distribution of calorie intake throughout the day with the consumption of 4-5 meals/snacks    Improve shortness of breath with ADL's  Yes    Intervention  Provide education, individualized exercise plan and daily activity instruction to help decrease symptoms of SOB with activities of daily living.    Expected Outcomes  Short Term: Improve cardiorespiratory fitness to achieve a reduction of symptoms when performing ADLs;Long Term: Be able to perform more ADLs without symptoms or delay the onset of symptoms     Heart Failure  Yes    Intervention  Provide a combined exercise and nutrition program that is supplemented with education, support and counseling about heart failure. Directed toward relieving symptoms such as shortness of breath, decreased exercise tolerance, and extremity edema.    Expected Outcomes  Improve functional capacity of life;Long term: Adoption of self-care skills and reduction of barriers for early signs and symptoms recognition and intervention leading to self-care maintenance.;Short term: Daily weights obtained and reported for increase. Utilizing diuretic protocols set by physician.;Short term: Attendance in program 2-3 days a week with increased exercise capacity. Reported lower sodium intake. Reported increased fruit and vegetable intake. Reports medication compliance.    Hypertension  Yes    Intervention  Provide education on lifestyle modifcations including regular physical activity/exercise, weight management, moderate sodium restriction and increased consumption of fresh fruit, vegetables, and low fat dairy, alcohol moderation, and smoking cessation.;Monitor prescription use compliance.    Expected Outcomes  Short Term: Continued assessment and intervention until BP is < 140/60m HG in hypertensive participants. < 130/839mHG in hypertensive participants with diabetes, heart failure or chronic kidney disease.;Long Term: Maintenance of blood pressure at goal levels.    Lipids  Yes    Intervention  Provide education and support for participant on nutrition & aerobic/resistive exercise along with prescribed medications to achieve LDL '70mg'$ , HDL >'40mg'$ .    Expected Outcomes  Short Term: Participant states understanding of desired cholesterol values and is compliant with medications prescribed. Participant is following exercise prescription and nutrition guidelines.;Long Term: Cholesterol controlled with medications as prescribed, with individualized exercise RX and with personalized  nutrition plan. Value goals: LDL < '70mg'$ , HDL > 40 mg.    Stress  Yes    Intervention  Offer individual and/or small group education and counseling on adjustment to heart disease, stress management and health-related lifestyle change. Teach and support self-help strategies.;Refer participants experiencing significant psychosocial distress to appropriate mental health specialists for further evaluation and treatment. When possible, include family members and significant others in education/counseling sessions.    Expected Outcomes  Short Term: Participant demonstrates changes in health-related behavior, relaxation and other stress management skills, ability to obtain effective social support, and compliance with psychotropic medications if prescribed.;Long Term: Emotional wellbeing is indicated by absence of clinically significant psychosocial distress or social isolation.            Personal Goals Discharge: Goals and Risk Factor Review    Core Components/Risk Factors/Patient Goals Review    Row Name 12/25/17 0941 01/01/18 1531 01/31/18 1643 03/01/18 0826 03/21/18 1125   Personal Goals Review  Weight Management/Obesity;Improve shortness of breath with ADL's;Heart Failure;Hypertension;Stress;Lipids  Weight Management/Obesity;Improve shortness of breath with ADL's;Heart Failure;Hypertension;Stress;Lipids  Weight Management/Obesity;Improve shortness of breath with ADL's;Heart Failure;Hypertension;Stress;Lipids  Weight Management/Obesity;Improve shortness of breath with ADL's;Heart Failure;Hypertension;Stress;Lipids  Weight Management/Obesity;Improve shortness of breath with ADL's;Heart Failure;Hypertension;Stress;Lipids   Review  pt with multiple CAD RF demostrates eagerness to participate in CR program.  pt personal goals are to improve dypsnea and improve balance.   pt with multiple CAD RF demostrates eagerness to participate in CR program.  pt personal goals are to improve dypsnea and improve balance.    pt with multiple CAD RF demostrates eagerness to participate in CR program.  pt personal goals are to improve dypsnea and improve balance. [facility]    pt with multiple CAD RF demostrates eagerness to participate in CR program.  pt personal goals are to improve dypsnea and improve balance. pt currently undergoing COPD evaluation. [facility].    pt with multiple CAD RF completed CR program with 36 sessions. pt feels personal goal of DOE has not improved. pt discouraged that she still  rquires rest breaks when walking long distances. pt encouraged to utilize PLB technique and take all medication including furosemide as directed.  pt admits to holding and missing doses due to the inconvenience.   However pt does feel her balance is improved.     Expected Outcomes  pt will participate in CR exercise, nutrition and lifestyle modifications to decrease overall RF.   pt will participate in CR exercise, nutrition and lifestyle modifications to decrease overall RF.   pt will participate in CR exercise, nutrition and lifestyle modifications to decrease overall RF.   pt will participate in CR exercise, nutrition and lifestyle modifications to decrease overall RF.   pt will participate in exercise, nutrition and lifestyle modifications to decrease overall RF.           Exercise Goals and Review: Exercise Goals    Exercise Goals    Row Name 12/19/17 0912   Increase Physical Activity  Yes   Intervention  Provide advice, education, support and counseling about physical activity/exercise needs.;Develop an individualized exercise prescription for aerobic and resistive training based on initial evaluation findings, risk stratification, comorbidities and participant's personal goals.   Expected Outcomes  Long Term: Exercising regularly at least 3-5 days a week.;Short Term: Attend rehab on a regular basis to increase amount  of physical activity.;Long Term: Add in home exercise to make exercise part of routine and to increase amount of physical activity.   Increase Strength and Stamina  Yes improve walking tolerance/capacity   Intervention  Provide advice, education, support and counseling about physical activity/exercise needs.;Develop an individualized exercise prescription for aerobic and resistive training based on initial evaluation findings, risk stratification, comorbidities and participant's personal goals.   Expected Outcomes  Short Term: Increase workloads from initial exercise prescription for resistance, speed, and METs.;Short Term: Perform resistance training exercises routinely during rehab and add in resistance training at home;Long Term: Improve cardiorespiratory fitness, muscular endurance and strength as measured by increased METs and functional capacity (6MWT)   Able to understand and use rate of perceived exertion (RPE) scale  Yes   Intervention  Provide education and explanation on how to use RPE scale   Expected Outcomes  Short Term: Able to use RPE daily in rehab to express subjective intensity level;Long Term:  Able to use RPE to guide intensity level when exercising independently   Knowledge and understanding of Target Heart Rate Range (THRR)  Yes   Intervention  Provide education and explanation of THRR including how the numbers were predicted and where they are located for reference   Expected Outcomes  Short Term: Able to state/look up THRR;Long Term: Able to use THRR to govern intensity when exercising independently;Short Term: Able to use daily as guideline for intensity in rehab   Able to check pulse independently  Yes   Intervention  Provide education and demonstration on how to check pulse in carotid and radial arteries.;Review the importance of being able to check your own pulse for safety during independent exercise   Expected Outcomes  Short Term: Able to explain why pulse checking is  important during independent exercise;Long Term: Able to check pulse independently and accurately   Understanding of Exercise Prescription  Yes   Intervention  Provide education, explanation, and written materials on patient's individual exercise prescription   Expected Outcomes  Short Term: Able to explain program exercise prescription;Long Term: Able to explain home exercise prescription to exercise independently          Nutrition & Weight - Outcomes:  Pre Biometrics - 12/19/17 1037    Pre Biometrics          Height  5' 6.25" (1.683 m)    Weight  208 lb 1.8 oz (94.4 kg)    Waist Circumference  44 inches    Hip Circumference  48.75 inches    Waist to Hip Ratio  0.9 %    BMI (Calculated)  33.33    Triceps Skinfold  55 mm    % Body Fat  48.6 %    Grip Strength  22 kg    Flexibility  11 in    Single Leg Stand  2 seconds          Post Biometrics - 03/14/18 1149     Post  Biometrics          Height  5' 6.25" (1.683 m)    Weight  208 lb 15.9 oz (94.8 kg)    Waist Circumference  46 inches    Hip Circumference  48 inches    Waist to Hip Ratio  0.96 %    BMI (Calculated)  33.47    Triceps Skinfold  53 mm    % Body Fat  49.2 %    Grip Strength  24 kg    Flexibility  14 in    Single Leg Stand  0 seconds           Nutrition: Nutrition Therapy & Goals - 12/19/17 1022    Nutrition Therapy          Diet  Heart Healthy        Personal Nutrition Goals          Nutrition Goal  Pt to identify food quantities necessary to achieve weight loss of 6-24 lb (2.7-10.9 kg) at graduation from cardiac rehab. Goal wt of 150 lb desired.         Intervention Plan          Intervention  Prescribe, educate and counsel regarding individualized specific dietary modifications aiming towards targeted core components such as weight, hypertension, lipid management, diabetes, heart failure and other comorbidities.    Expected Outcomes  Short Term Goal: Understand basic principles of dietary  content, such as calories, fat, sodium, cholesterol and nutrients.;Long Term Goal: Adherence to prescribed nutrition plan.           Nutrition Discharge: Nutrition Assessments - 03/30/18 1112    MEDFICTS Scores          Pre Score  24    Post Score  21    Score Difference  -3           Education Questionnaire Score: Knowledge Questionnaire Score - 12/19/17 1031    Knowledge Questionnaire Score          Pre Score  22/24           Goals reviewed with patient; copy given to patient.

## 2018-04-06 ENCOUNTER — Other Ambulatory Visit: Payer: Self-pay

## 2018-04-06 MED ORDER — PANTOPRAZOLE SODIUM 40 MG PO TBEC: DELAYED_RELEASE_TABLET | ORAL | 1 refills | Status: DC

## 2018-04-13 ENCOUNTER — Ambulatory Visit (INDEPENDENT_AMBULATORY_CARE_PROVIDER_SITE_OTHER): Payer: Medicare Other | Admitting: Emergency Medicine

## 2018-04-13 ENCOUNTER — Encounter: Payer: Self-pay | Admitting: Physician Assistant

## 2018-04-13 ENCOUNTER — Other Ambulatory Visit: Payer: Self-pay

## 2018-04-13 ENCOUNTER — Encounter: Payer: Self-pay | Admitting: Emergency Medicine

## 2018-04-13 VITALS — BP 110/60 | HR 90 | Ht 65.5 in | Wt 209.0 lb

## 2018-04-13 DIAGNOSIS — R0602 Shortness of breath: Secondary | ICD-10-CM

## 2018-04-13 DIAGNOSIS — R0609 Other forms of dyspnea: Secondary | ICD-10-CM

## 2018-04-13 DIAGNOSIS — I2510 Atherosclerotic heart disease of native coronary artery without angina pectoris: Secondary | ICD-10-CM

## 2018-04-13 DIAGNOSIS — R0600 Dyspnea, unspecified: Secondary | ICD-10-CM

## 2018-04-13 NOTE — Progress Notes (Signed)
Subjective:    Patient ID: Tracy [name], female    DOB: 1946/07/24, 72 y.o.   MRN: [id number]  HPI 72 year old obese woman, former smoker (40+ pack years) with a history of coronary artery disease with recent stenting to her circumflex 10/2017, hypertension with evidence of diastolic dysfunction 51/88/4166, hyperlipidemia.    She has experienced exertional dyspnea in Summer 2018, was a bit better after she had new stent place 10/2017, but still persisted with long walks. Participated in cardiac rehab and has improved even more, but she still has limitations in her function, especially when she is not holding a cart or walker. She has intermittent cough, especially bothersome when she has a URI. She has been rx for bronchitis or a flare w abx  before, last was Spring 2019. She has put on 25 lbs in the last year.   She does not sleep well, wakes up freq. She does not snore much. No witnessed apneas.     Review of Systems  Constitutional: Positive for unexpected weight change. Negative for fever.  HENT: Positive for dental problem and ear pain. Negative for congestion, nosebleeds, postnasal drip, rhinorrhea, sinus pressure, sneezing, sore throat and trouble swallowing.   Eyes: Negative for redness and itching.  Respiratory: Positive for cough. Negative for chest tightness, shortness of breath and wheezing.   Cardiovascular: Negative for palpitations and leg swelling.  Gastrointestinal: Negative for nausea and vomiting.  Genitourinary: Negative for dysuria.  Musculoskeletal: Positive for joint swelling.  Skin: Negative for rash.  Neurological: Positive for headaches.  Hematological: Does not bruise/bleed easily.  Psychiatric/Behavioral: Negative for dysphoric mood. The patient is nervous/anxious.     Past Medical History:  Diagnosis Date  . Allergic rhinitis   . CAD (coronary artery disease)    1/19 PCI/DES to [place] for ISR, normal EF.   Marland Kitchen CHF (congestive heart failure) ([place])   .  Gout   . Headache(784.0)   . Hyperlipidemia   . Hypertension   . Low back pain   . Menopausal syndrome   . Myocardial infarct ([place]) 2007   hx of  . Overactive bladder      Family History  Adopted: Yes  Problem Relation Age of Onset  . Colon cancer Mother 85  . Other Unknown        patient is adopted  . Stomach cancer Neg Hx      Social History   Socioeconomic History  . Marital status: Married    Spouse name: Not on file  . Number of children: Not on file  . Years of education: Not on file  . Highest education level: Not on file  Occupational History  . Occupation: direct Civil engineer, contracting  Social Needs  . Financial resource strain: Not on file  . Food insecurity:    Worry: Not on file    Inability: Not on file  . Transportation needs:    Medical: No    Non-medical: No  Tobacco Use  . Smoking status: Former Smoker    Last attempt to quit: 07/01/2012    Years since quitting: 5.7  . Smokeless tobacco: Never Used  Substance and Sexual Activity  . Alcohol use: No  . Drug use: No  . Sexual activity: Not on file  Lifestyle  . Physical activity:    Days per week: 0 days    Minutes per session: 0 min  . Stress: Only a little  Relationships  . Social connections:    Talks on phone:  Not on file    Gets together: Not on file    Attends religious service: Not on file    Active member of club or organization: Not on file    Attends meetings of clubs or organizations: Not on file    Relationship status: Not on file  . Intimate partner violence:    Fear of current or ex partner: Not on file    Emotionally abused: Not on file    Physically abused: Not on file    Forced sexual activity: Not on file  Other Topics Concern  . Not on file  Social History Narrative  . Not on file   from [place], has worked at [employer], Press photographer.  Lives in an old house, no current water damage, no mold.  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    Allergies  Allergen Reactions  .  Erythromycin Rash    But isn't certain  . Sulfamethoxazole Rash     Outpatient Medications Prior to Visit  Medication Sig Dispense Refill  . allopurinol (ZYLOPRIM) 100 MG tablet TAKE 1 TABLET BY MOUTH EVERY DAY 90 tablet 0  . ALPRAZolam (XANAX) 0.25 MG tablet TAKE 1 TABLET TWICE A DAY AS NEEDED FOR ANXIETY 60 tablet 0  . aspirin EC 81 MG tablet Take 81 mg by mouth daily.    Marland Kitchen atorvastatin (LIPITOR) 40 MG tablet TAKE 1 TABLET DAILY BY MOUTH. PLEASE KEEP UPCOMING APPT FOR FUTURE REFILLS. THANKS 90 tablet 3  . calcium carbonate (CALCIUM 600) 600 MG TABS tablet Take 600 mg by mouth See admin instructions. Once every 2 weeks    . cetirizine (ZYRTEC) 10 MG tablet Take 10 mg by mouth daily.    . Cholecalciferol (VITAMIN D3 PO) Take 1,000 Units by mouth daily.    . clopidogrel (PLAVIX) 75 MG tablet Take 1 tablet (75 mg total) by mouth daily with breakfast. 90 tablet 2  . Coenzyme Q10 (CO Q 10 PO) Take 300 mg by mouth See admin instructions. Once every 2 weeks.    . Cyanocobalamin (VITAMIN B-12) 5000 MCG SUBL Place 10,000 mcg under the tongue daily.     . furosemide (LASIX) 40 MG tablet Take 1 tablet (40 mg total) by mouth daily. 90 tablet 3  . HYDROcodone-acetaminophen (NORCO/VICODIN) 5-325 MG tablet Take 1 tablet by mouth every 6 (six) hours as needed for moderate pain.    Marland Kitchen losartan (COZAAR) 100 MG tablet TAKE 1 TABLET (100 MG TOTAL) BY MOUTH DAILY. 90 tablet 3  . metoprolol tartrate (LOPRESSOR) 100 MG tablet Take 1 tablet (100 mg total) by mouth as directed. Please take 100 mg in the morning and 50 mg at bedtime 180 tablet 3  . Misc Natural Products (TART CHERRY ADVANCED) CAPS Take 1,000 capsules by mouth daily.     . nitroGLYCERIN (NITROSTAT) 0.4 MG SL tablet Place 1 tablet (0.4 mg total) under the tongue every 5 (five) minutes as needed for chest pain. 25 tablet 3  . Omega-3 Fatty Acids (FISH OIL OMEGA-3 PO) Take 1,400 mg by mouth See admin instructions. Once every couple weeks    . pantoprazole  (PROTONIX) 40 MG tablet TAKE 1 TABLET (40 MG TOTAL) BY MOUTH DAILY. 90 tablet 1  . potassium chloride (K-DUR) 10 MEQ tablet Take 1 tablet (10 mEq total) by mouth daily. 90 tablet 3  . vitamin E 200 UNIT capsule Take 200 Units by mouth daily.     No facility-administered medications prior to visit.  Objective:   Physical Exam Vitals:   04/13/18 1043  BP: 110/60  Pulse: 90  SpO2: 96%   Gen: Pleasant, overwt, in no distress,  normal affect  ENT: No lesions,  mouth clear,  oropharynx clear, no postnasal drip  Neck: No JVD, no stridor  Lungs: No use of accessory muscles, slightly distant, no wheeze or crackles.   Cardiovascular: RRR, heart sounds normal, no murmur or gallops, no peripheral edema  Musculoskeletal: No deformities, no cyanosis or clubbing  Neuro: alert, non focal  Skin: Warm, no lesions or rash     Assessment & Plan:  Tobacco abuse Congratulated her on quitting  Dyspnea on exertion Based on her history this is multifactorial and certainly did include her coronary disease which is been evaluated and treated, some deconditioning.  She is not back to baseline and she describes a syndrome very consistent with COPD.  I suspect that this is part of her dyspnea as well.  We will evaluate further, start bronchodilators presuming they are indicated.  We will perform full pulmonary function testing (PFT) Based on your PFT we will decide which inhaler might benefit your breathing as we go forward.  Keep doing your walking and exercise Agree with your plans to work on weight loss.  Follow with Dr Lamonte Sakai next available with full PFT.   Baltazar Apo, MD, PhD 04/13/2018, 10:44 AM [facility] [phone] or if no answer [phone]

## 2018-04-13 NOTE — Assessment & Plan Note (Signed)
Based on her history this is multifactorial and certainly did include her coronary disease which is been evaluated and treated, some deconditioning.  She is not back to baseline and she describes a syndrome very consistent with COPD.  I suspect that this is part of her dyspnea as well.  We will evaluate further, start bronchodilators presuming they are indicated.  We will perform full pulmonary function testing (PFT) Based on your PFT we will decide which inhaler might benefit your breathing as we go forward.  Keep doing your walking and exercise Agree with your plans to work on weight loss.  Follow with Dr Lamonte Sakai next available with full PFT.

## 2018-04-13 NOTE — Assessment & Plan Note (Signed)
Congratulated her on quitting! 

## 2018-04-13 NOTE — Patient Instructions (Addendum)
Congratulations on stopping smoking! We will perform full pulmonary function testing (PFT) Based on your PFT we will decide which inhaler might benefit your breathing as we go forward.  Keep doing your walking and exercise Agree with your plans to work on weight loss.  Follow with Dr Lamonte Sakai next available with full PFT.

## 2018-04-23 ENCOUNTER — Encounter: Payer: Self-pay | Admitting: Physician Assistant

## 2018-05-08 ENCOUNTER — Ambulatory Visit: Payer: Medicare Other | Admitting: Physician Assistant

## 2018-05-23 NOTE — Progress Notes (Addendum)
Subjective:   Tracy [name] is a 72 y.o. female who presents for Medicare Annual (Subsequent) preventive examination.  Reports health as fair as knees  Also has OA  Is having a test for COPD tomorrow as cardiologist is suspecting this as etiology of sob  MI 2007 - Stent in January  Is having some SOB when walking  Was in cardiac rehab in June  [place] and then found she needed a stent replacement  Last OV 02/2018   States knees are worse and will discuss with Dr. Raliegh Ip on Monday   Diet BMI 36  Chol/hdl 5; hdl 31; trig 175 Recent stenting 10/2017  What do you do well with your diet Retaining fluid  States she feels this started when she stopped walking at rehab in June   Exercise Goal is to start exercising and being evaluated for COPD  Goes to [facility] and pushes the buggy around to walk   Former smoker; quit 06/2012   Health Maintenance Due  Topic Date Due  . DEXA SCAN  04/10/2013  . MAMMOGRAM  09/29/2016  . COLONOSCOPY  04/30/2018  . INFLUENZA VACCINE  05/17/2018     Colonoscopy 04/2008 - due 04/2018  Is waiting on pulmonary eval before planning  (biological mother had colon cancer)  DNA ancestry   Mammogram 09/2015  Dexa due since 67;      Objective:     Vitals: BP 108/70   Pulse 83   Ht 5\' 4"  (1.626 m)   Wt 214 lb (97.1 kg)   SpO2 95%   BMI 36.73 kg/m   Body mass index is 36.73 kg/m.  Advanced Directives 12/19/2017 12/18/2017 11/06/2017 01/10/2016  Does Patient Have a Medical Advance Directive? No No No No  Would patient like information on creating a medical advance directive? No - Patient declined - - -   Given [place] form  Will try to complete AD; Given copy  Referred to [facility] for questions  Advanced Directive; Reviewed advanced directive and agreed to receipt of information and discussion.  Focused face to face x  20 minutes discussing HCPOA and Living will and reviewed all the questions in the [place] forms.  The patient voices understanding of HCPOA; LW reviewed and information provided on each question. Educated on how to revoke this HCPOA or LW at any time.   Also  discussed life prolonging measures (given a few examples) and where she could choose to initiate or not;  the ability to given the HCPOA power to change her living will or not if she cannot speak for herself; as well as finalizing the will by 2 unrelated witnesses and notary.  Will call for questions and given information on [facility] pastoral department for further assistance.         Tobacco Social History   Tobacco Use  Smoking Status Former Smoker  . Packs/day: 0.20  . Years: 50.00  . Pack years: 10.00  . Last attempt to quit: 07/01/2012  . Years since quitting: 5.8  Smokeless Tobacco Never Used  Tobacco Comment   completely quit May of 2018; period of years she did not smoke      Counseling given: Not Answered Comment: completely quit May of 2018; period of years she did not smoke    Clinical Intake:   Past Medical History:  Diagnosis Date  . Allergic rhinitis   . CAD (coronary artery disease)    1/19 PCI/DES  to [place] for ISR, normal EF.   Marland Kitchen CHF (congestive heart failure) ([place])   . Gout   . Headache(784.0)   . Hyperlipidemia   . Hypertension   . Low back pain   . Menopausal syndrome   . Myocardial infarct ([place]) 2007   hx of  . Overactive bladder    Past Surgical History:  Procedure Laterality Date  . ANGIOPLASTY     stent 2007  . CARDIAC CATHETERIZATION    . COLONOSCOPY    . CORONARY STENT INTERVENTION N/A 11/06/2017   Procedure: CORONARY STENT INTERVENTION;  Surgeon: [name], MD;  Location: [facility];  Service: Cardiovascular;  Laterality: N/A;  . LEFT HEART CATH AND CORONARY ANGIOGRAPHY N/A 11/06/2017   Procedure: LEFT HEART CATH AND CORONARY ANGIOGRAPHY;  Surgeon: [name], MD;  Location: [facility];  Service: Cardiovascular;  Laterality: N/A;  . TONSILLECTOMY       Family History  Adopted: Yes  Problem Relation Age of Onset  . Colon cancer Mother 80  . Other Unknown        patient is adopted  . Stomach cancer Neg Hx    Social History   Socioeconomic History  . Marital status: Married    Spouse name: Not on file  . Number of children: Not on file  . Years of education: Not on file  . Highest education level: Not on file  Occupational History  . Occupation: direct Civil engineer, contracting  Social Needs  . Financial resource strain: Not on file  . Food insecurity:    Worry: Not on file    Inability: Not on file  . Transportation needs:    Medical: No    Non-medical: No  Tobacco Use  . Smoking status: Former Smoker    Packs/day: 0.20    Years: 50.00    Pack years: 10.00    Last attempt to quit: 07/01/2012    Years since quitting: 5.8  . Smokeless tobacco: Never Used  . Tobacco comment: completely quit May of 2018; period of years she did not smoke   Substance and Sexual Activity  . Alcohol use: No  . Drug use: No  . Sexual activity: Not on file  Lifestyle  . Physical activity:    Days per week: 0 days    Minutes per session: 0 min  . Stress: Only a little  Relationships  . Social connections:    Talks on phone: Not on file    Gets together: Not on file    Attends religious service: Not on file    Active member of club or organization: Not on file    Attends meetings of clubs or organizations: Not on file    Relationship status: Not on file  Other Topics Concern  . Not on file  Social History Narrative  . Not on file    Outpatient Encounter Medications as of 05/24/2018  Medication Sig  . allopurinol (ZYLOPRIM) 100 MG tablet TAKE 1 TABLET BY MOUTH EVERY DAY  . ALPRAZolam (XANAX) 0.25 MG tablet TAKE 1 TABLET TWICE A DAY AS NEEDED FOR ANXIETY  . aspirin EC 81 MG tablet Take 81 mg by mouth daily.  Marland Kitchen atorvastatin (LIPITOR) 40 MG tablet TAKE 1 TABLET DAILY BY MOUTH. PLEASE KEEP UPCOMING APPT FOR FUTURE REFILLS. THANKS  .  calcium carbonate (CALCIUM 600) 600 MG TABS tablet Take 600 mg by mouth See admin instructions. Once every 2 weeks  . cetirizine (ZYRTEC) 10 MG tablet  Take 10 mg by mouth daily.  . Cholecalciferol (VITAMIN D3 PO) Take 1,000 Units by mouth daily.  . clopidogrel (PLAVIX) 75 MG tablet Take 1 tablet (75 mg total) by mouth daily with breakfast.  . Cyanocobalamin (VITAMIN B-12) 5000 MCG SUBL Place 10,000 mcg under the tongue daily.   . furosemide (LASIX) 40 MG tablet Take 1 tablet (40 mg total) by mouth daily.  Marland Kitchen HYDROcodone-acetaminophen (NORCO/VICODIN) 5-325 MG tablet Take 1 tablet by mouth every 6 (six) hours as needed for moderate pain.  Marland Kitchen losartan (COZAAR) 100 MG tablet TAKE 1 TABLET (100 MG TOTAL) BY MOUTH DAILY.  . metoprolol tartrate (LOPRESSOR) 100 MG tablet Take 1 tablet (100 mg total) by mouth as directed. Please take 100 mg in the morning and 50 mg at bedtime  . Misc Natural Products (OSTEO BI-FLEX ADV JOINT SHIELD PO) Take by mouth.  . Misc Natural Products (TART CHERRY ADVANCED) CAPS Take 1,000 capsules by mouth daily.   . nitroGLYCERIN (NITROSTAT) 0.4 MG SL tablet Place 1 tablet (0.4 mg total) under the tongue every 5 (five) minutes as needed for chest pain.  . pantoprazole (PROTONIX) 40 MG tablet TAKE 1 TABLET (40 MG TOTAL) BY MOUTH DAILY.  Marland Kitchen potassium chloride (K-DUR) 10 MEQ tablet Take 1 tablet (10 mEq total) by mouth daily.  . vitamin E 200 UNIT capsule Take 200 Units by mouth daily.  . Coenzyme Q10 (CO Q 10 PO) Take 300 mg by mouth See admin instructions. Once every 2 weeks.  . Omega-3 Fatty Acids (FISH OIL OMEGA-3 PO) Take 1,400 mg by mouth See admin instructions. Once every couple weeks   No facility-administered encounter medications on file as of 05/24/2018.     Activities of Daily Living No flowsheet data found.  Patient Care Team: Marletta Lor, MD as PCP - Cyndia Diver, MD as PCP - Cardiology (Cardiology)    Assessment:   This is a routine wellness  examination for Tracy [name].  Exercise Activities and Dietary recommendations    Goals    . Patient Stated     Evaluating breathing Evaluation of your knee        Fall Risk Fall Risk  12/19/2017 10/18/2016 10/05/2016 08/05/2015 03/23/2015  Falls in the past year? No Yes No No Yes  Number falls in past yr: - 2 or more - - 1  Injury with Fall? - Yes - - Yes  Risk Factor Category  - High Fall Risk - - -  Risk for fall due to : Impaired balance/gait - - - Impaired balance/gait     Depression Screen PHQ 2/9 Scores 03/21/2018 12/25/2017 10/18/2016 10/05/2016  PHQ - 2 Score 0 2 0 0  PHQ- 9 Score - 6 - -    Situational mood changes due to health When health improves, she will improve  Started when she got gout   Cognitive Function     Ad8 score reviewed for issues:  Issues making decisions:  Less interest in hobbies / activities:  Repeats questions, stories (family complaining):  Trouble using ordinary gadgets (microwave, computer, phone):  Forgets the month or year:   Mismanaging finances:   Remembering appts:  Daily problems with thinking and/or memory: Ad8 score is=0        Immunization History  Administered Date(s) Administered  . Influenza Split 07/22/2011, 07/23/2012  . Influenza Whole 08/17/2006, 07/02/2010  . Influenza, High Dose Seasonal PF 09/05/2014, 08/05/2015, 07/11/2016, 07/31/2017  . Influenza-Unspecified 07/17/2013  . Pneumococcal Conjugate-13 09/09/2013  . Pneumococcal Polysaccharide-23  08/05/2015  . Tdap 08/05/2011     Screening Tests Health Maintenance  Topic Date Due  . DEXA SCAN  04/10/2013  . MAMMOGRAM  09/29/2016  . COLONOSCOPY  04/30/2018  . INFLUENZA VACCINE  05/17/2018  . TETANUS/TDAP  08/04/2021  . Hepatitis C Screening  Completed  . PNA vac Low Risk Adult  Completed        Plan:      PCP Notes   Health Maintenance Colonoscopy 04/2008 - due 04/2018  Is waiting on pulmonary eval before planning  (biological mother had  colon cancer)  DNA ancestry   Mammogram 09/2015  Dexa due since 67;  (agreed to dexa scan when leaving and one ordered to do with mammogram and can schedule when feeling better)   Educated regarding shingrix   Given [place] AD and LW   Abnormal Screens  none  Referrals  [facility]  Patient concerns; fup on her knee pain on Monday with Dr. Silvestre Gunner with PFT and pulmonology tomorrow for SOB as referred by cardiology   To schedule her colonoscopy when her breathing is better;  Just found out her birth mother had colon cancer  Nurse Concerns; As noted   Next PCP apt 05/28/2018        I have personally reviewed and noted the following in the patient's chart:   . Medical and social history . Use of alcohol, tobacco or illicit drugs  . Current medications and supplements . Functional ability and status . Nutritional status . Physical activity . Advanced directives . List of other physicians . Hospitalizations, surgeries, and ER visits in previous 12 months . Vitals . Screenings to include cognitive, depression, and falls . Referrals and appointments  In addition, I have reviewed and discussed with patient certain preventive protocols, quality metrics, and best practice recommendations. A written personalized care plan for preventive services as well as general preventive health recommendations were provided to patient.   (I have reviewed the documentation for AWV and Advanced Care Planning provided by the Health Coach and agree with the documentation. I was immediately available for any questions. )   Jetaime Pinnix, RN  05/24/2018  Subsequent Medicare wellness visit reviewed and agree with findings  Marletta Lor

## 2018-05-24 ENCOUNTER — Ambulatory Visit (INDEPENDENT_AMBULATORY_CARE_PROVIDER_SITE_OTHER): Payer: Medicare Other

## 2018-05-24 ENCOUNTER — Other Ambulatory Visit: Payer: Self-pay | Admitting: Internal Medicine

## 2018-05-24 VITALS — BP 108/70 | HR 83 | Ht 64.0 in | Wt 214.0 lb

## 2018-05-24 DIAGNOSIS — E2839 Other primary ovarian failure: Secondary | ICD-10-CM | POA: Diagnosis not present

## 2018-05-24 DIAGNOSIS — R921 Mammographic calcification found on diagnostic imaging of breast: Secondary | ICD-10-CM

## 2018-05-24 DIAGNOSIS — Z0000 Encounter for general adult medical examination without abnormal findings: Secondary | ICD-10-CM | POA: Diagnosis not present

## 2018-05-24 NOTE — Patient Instructions (Addendum)
Tracy [name] , Thank you for taking time to come for your Medicare Wellness Visit. I appreciate your ongoing commitment to your health goals. Please review the following plan we discussed and let me know if I can assist you in the future.   Will schedule colonoscopy as your breathing improves  Will schedule a mammogram sometimes this year if possible   Bone density - We would like to check at some point   Shingrix is a vaccine for the prevention of Shingles in Adults 50 and older.  If you are on Medicare, the shingrix is covered under your Part D plan, so you will take both of the vaccines in the series at your pharmacy. Please check with your benefits regarding applicable copays or out of pocket expenses.  The Shingrix is given in 2 vaccines approx 8 weeks apart. You must receive the 2nd dose prior to 6 months from receipt of the first. Please have the pharmacist print out you Immunization  dates for our office records    These are the goals we discussed: Goals    . Patient Stated     Evaluating breathing Evaluation of your knee        This is a list of the screening recommended for you and due dates:  Health Maintenance  Topic Date Due  . DEXA scan (bone density measurement)  04/10/2013  . Mammogram  09/29/2016  . Colon Cancer Screening  04/30/2018  . Flu Shot  05/17/2018  . Tetanus Vaccine  08/04/2021  .  Hepatitis C: One time screening is recommended by Center for Disease Control  (CDC) for  adults born from [birth year] through 1963.   Completed  . Pneumonia vaccines  Completed      Fall Prevention in the Home Falls can cause injuries. They can happen to people of all ages. There are many things you can do to make your home safe and to help prevent falls. What can I do on the outside of my home?  Regularly fix the edges of walkways and driveways and fix any cracks.  Remove anything that might make you trip as you walk through a door, such as a raised step or  threshold.  Trim any bushes or trees on the path to your home.  Use bright outdoor lighting.  Clear any walking paths of anything that might make someone trip, such as rocks or tools.  Regularly check to see if handrails are loose or broken. Make sure that both sides of any steps have handrails.  Any raised decks and porches should have guardrails on the edges.  Have any leaves, snow, or ice cleared regularly.  Use sand or salt on walking paths during winter.  Clean up any spills in your garage right away. This includes oil or grease spills. What can I do in the bathroom?  Use night lights.  Install grab bars by the toilet and in the tub and shower. Do not use towel bars as grab bars.  Use non-skid mats or decals in the tub or shower.  If you need to sit down in the shower, use a plastic, non-slip stool.  Keep the floor dry. Clean up any water that spills on the floor as soon as it happens.  Remove soap buildup in the tub or shower regularly.  Attach bath mats securely with double-sided non-slip rug tape.  Do not have throw rugs and other things on the floor that can make you trip. What can I do in the  bedroom?  Use night lights.  Make sure that you have a light by your bed that is easy to reach.  Do not use any sheets or blankets that are too big for your bed. They should not hang down onto the floor.  Have a firm chair that has side arms. You can use this for support while you get dressed.  Do not have throw rugs and other things on the floor that can make you trip. What can I do in the kitchen?  Clean up any spills right away.  Avoid walking on wet floors.  Keep items that you use a lot in easy-to-reach places.  If you need to reach something above you, use a strong step stool that has a grab bar.  Keep electrical cords out of the way.  Do not use floor polish or wax that makes floors slippery. If you must use wax, use non-skid floor wax.  Do not have  throw rugs and other things on the floor that can make you trip. What can I do with my stairs?  Do not leave any items on the stairs.  Make sure that there are handrails on both sides of the stairs and use them. Fix handrails that are broken or loose. Make sure that handrails are as long as the stairways.  Check any carpeting to make sure that it is firmly attached to the stairs. Fix any carpet that is loose or worn.  Avoid having throw rugs at the top or bottom of the stairs. If you do have throw rugs, attach them to the floor with carpet tape.  Make sure that you have a light switch at the top of the stairs and the bottom of the stairs. If you do not have them, ask someone to add them for you. What else can I do to help prevent falls?  Wear shoes that: ? Do not have high heels. ? Have rubber bottoms. ? Are comfortable and fit you well. ? Are closed at the toe. Do not wear sandals.  If you use a stepladder: ? Make sure that it is fully opened. Do not climb a closed stepladder. ? Make sure that both sides of the stepladder are locked into place. ? Ask someone to hold it for you, if possible.  Clearly mark and make sure that you can see: ? Any grab bars or handrails. ? First and last steps. ? Where the edge of each step is.  Use tools that help you move around (mobility aids) if they are needed. These include: ? Canes. ? Walkers. ? Scooters. ? Crutches.  Turn on the lights when you go into a dark area. Replace any light bulbs as soon as they burn out.  Set up your furniture so you have a clear path. Avoid moving your furniture around.  If any of your floors are uneven, fix them.  If there are any pets around you, be aware of where they are.  Review your medicines with your doctor. Some medicines can make you feel dizzy. This can increase your chance of falling. Ask your doctor what other things that you can do to help prevent falls. This information is not intended to  replace advice given to you by your health care provider. Make sure you discuss any questions you have with your health care provider. Document Released: 07/30/2009 Document Revised: 03/10/2016 Document Reviewed: 11/07/2014 Elsevier Interactive Patient Education  2018 [place] Maintenance, Female Adopting a healthy lifestyle and getting  preventive care can go a long way to promote health and wellness. Talk with your health care provider about what schedule of regular examinations is right for you. This is a good chance for you to check in with your provider about disease prevention and staying healthy. In between checkups, there are plenty of things you can do on your own. Experts have done a lot of research about which lifestyle changes and preventive measures are most likely to keep you healthy. Ask your health care provider for more information. Weight and diet Eat a healthy diet  Be sure to include plenty of vegetables, fruits, low-fat dairy products, and lean protein.  Do not eat a lot of foods high in solid fats, added sugars, or salt.  Get regular exercise. This is one of the most important things you can do for your health. ? Most adults should exercise for at least 150 minutes each week. The exercise should increase your heart rate and make you sweat (moderate-intensity exercise). ? Most adults should also do strengthening exercises at least twice a week. This is in addition to the moderate-intensity exercise.  Maintain a healthy weight  Body mass index (BMI) is a measurement that can be used to identify possible weight problems. It estimates body fat based on height and weight. Your health care provider can help determine your BMI and help you achieve or maintain a healthy weight.  For females 70 years of age and older: ? A BMI below 18.5 is considered underweight. ? A BMI of 18.5 to 24.9 is normal. ? A BMI of 25 to 29.9 is considered overweight. ? A BMI of 30 and  above is considered obese.  Watch levels of cholesterol and blood lipids  You should start having your blood tested for lipids and cholesterol at 70 years of age, then have this test every 5 years.  You may need to have your cholesterol levels checked more often if: ? Your lipid or cholesterol levels are high. ? You are older than 70 years of age. ? You are at high risk for heart disease.  Cancer screening Lung Cancer  Lung cancer screening is recommended for adults 95-35 years old who are at high risk for lung cancer because of a history of smoking.  A yearly low-dose CT scan of the lungs is recommended for people who: ? Currently smoke. ? Have quit within the past 15 years. ? Have at least a 30-pack-year history of smoking. A pack year is smoking an average of one pack of cigarettes a day for 1 year.  Yearly screening should continue until it has been 15 years since you quit.  Yearly screening should stop if you develop a health problem that would prevent you from having lung cancer treatment.  Breast Cancer  Practice breast self-awareness. This means understanding how your breasts normally appear and feel.  It also means doing regular breast self-exams. Let your health care provider know about any changes, no matter how small.  If you are in your 20s or 30s, you should have a clinical breast exam (CBE) by a health care provider every 1-3 years as part of a regular health exam.  If you are 64 or older, have a CBE every year. Also consider having a breast X-ray (mammogram) every year.  If you have a family history of breast cancer, talk to your health care provider about genetic screening.  If you are at high risk for breast cancer, talk to your health care provider  about having an MRI and a mammogram every year.  Breast cancer gene (BRCA) assessment is recommended for women who have family members with BRCA-related cancers. BRCA-related cancers  include: ? Breast. ? Ovarian. ? Tubal. ? Peritoneal cancers.  Results of the assessment will determine the need for genetic counseling and BRCA1 and BRCA2 testing.  Cervical Cancer Your health care provider may recommend that you be screened regularly for cancer of the pelvic organs (ovaries, uterus, and vagina). This screening involves a pelvic examination, including checking for microscopic changes to the surface of your cervix (Pap test). You may be encouraged to have this screening done every 3 years, beginning at age 53.  For women ages 52-65, health care providers may recommend pelvic exams and Pap testing every 3 years, or they may recommend the Pap and pelvic exam, combined with testing for human papilloma virus (HPV), every 5 years. Some types of HPV increase your risk of cervical cancer. Testing for HPV may also be done on women of any age with unclear Pap test results.  Other health care providers may not recommend any screening for nonpregnant women who are considered low risk for pelvic cancer and who do not have symptoms. Ask your health care provider if a screening pelvic exam is right for you.  If you have had past treatment for cervical cancer or a condition that could lead to cancer, you need Pap tests and screening for cancer for at least 20 years after your treatment. If Pap tests have been discontinued, your risk factors (such as having a new sexual partner) need to be reassessed to determine if screening should resume. Some women have medical problems that increase the chance of getting cervical cancer. In these cases, your health care provider may recommend more frequent screening and Pap tests.  Colorectal Cancer  This type of cancer can be detected and often prevented.  Routine colorectal cancer screening usually begins at 71 years of age and continues through 70 years of age.  Your health care provider may recommend screening at an earlier age if you have risk factors  for colon cancer.  Your health care provider may also recommend using home test kits to check for hidden blood in the stool.  A small camera at the end of a tube can be used to examine your colon directly (sigmoidoscopy or colonoscopy). This is done to check for the earliest forms of colorectal cancer.  Routine screening usually begins at age 35.  Direct examination of the colon should be repeated every 5-10 years through 70 years of age. However, you may need to be screened more often if early forms of precancerous polyps or small growths are found.  Skin Cancer  Check your skin from head to toe regularly.  Tell your health care provider about any new moles or changes in moles, especially if there is a change in a mole's shape or color.  Also tell your health care provider if you have a mole that is larger than the size of a pencil eraser.  Always use sunscreen. Apply sunscreen liberally and repeatedly throughout the day.  Protect yourself by wearing long sleeves, pants, a wide-brimmed hat, and sunglasses whenever you are outside.  Heart disease, diabetes, and high blood pressure  High blood pressure causes heart disease and increases the risk of stroke. High blood pressure is more likely to develop in: ? People who have blood pressure in the high end of the normal range (130-139/85-89 mm Hg). ? People who  are overweight or obese. ? People who are African American.  If you are 19-58 years of age, have your blood pressure checked every 3-5 years. If you are 31 years of age or older, have your blood pressure checked every year. You should have your blood pressure measured twice-once when you are at a hospital or clinic, and once when you are not at a hospital or clinic. Record the average of the two measurements. To check your blood pressure when you are not at a hospital or clinic, you can use: ? An automated blood pressure machine at a pharmacy. ? A home blood pressure monitor.  If  you are between 52 years and 33 years old, ask your health care provider if you should take aspirin to prevent strokes.  Have regular diabetes screenings. This involves taking a blood sample to check your fasting blood sugar level. ? If you are at a normal weight and have a low risk for diabetes, have this test once every three years after 70 years of age. ? If you are overweight and have a high risk for diabetes, consider being tested at a younger age or more often. Preventing infection Hepatitis B  If you have a higher risk for hepatitis B, you should be screened for this virus. You are considered at high risk for hepatitis B if: ? You were born in a country where hepatitis B is common. Ask your health care provider which countries are considered high risk. ? Your parents were born in a high-risk country, and you have not been immunized against hepatitis B (hepatitis B vaccine). ? You have HIV or AIDS. ? You use needles to inject street drugs. ? You live with someone who has hepatitis B. ? You have had sex with someone who has hepatitis B. ? You get hemodialysis treatment. ? You take certain medicines for conditions, including cancer, organ transplantation, and autoimmune conditions.  Hepatitis C  Blood testing is recommended for: ? Everyone born from [birth year] through 1963. ? Anyone with known risk factors for hepatitis C.  Sexually transmitted infections (STIs)  You should be screened for sexually transmitted infections (STIs) including gonorrhea and chlamydia if: ? You are sexually active and are younger than 70 years of age. ? You are older than 70 years of age and your health care provider tells you that you are at risk for this type of infection. ? Your sexual activity has changed since you were last screened and you are at an increased risk for chlamydia or gonorrhea. Ask your health care provider if you are at risk.  If you do not have HIV, but are at risk, it may be recommended  that you take a prescription medicine daily to prevent HIV infection. This is called pre-exposure prophylaxis (PrEP). You are considered at risk if: ? You are sexually active and do not regularly use condoms or know the HIV status of your partner(s). ? You take drugs by injection. ? You are sexually active with a partner who has HIV.  Talk with your health care provider about whether you are at high risk of being infected with HIV. If you choose to begin PrEP, you should first be tested for HIV. You should then be tested every 3 months for as long as you are taking PrEP. Pregnancy  If you are premenopausal and you may become pregnant, ask your health care provider about preconception counseling.  If you may become pregnant, take 400 to 800 micrograms (mcg) of folic  acid every day.  If you want to prevent pregnancy, talk to your health care provider about birth control (contraception). Osteoporosis and menopause  Osteoporosis is a disease in which the bones lose minerals and strength with aging. This can result in serious bone fractures. Your risk for osteoporosis can be identified using a bone density scan.  If you are 11 years of age or older, or if you are at risk for osteoporosis and fractures, ask your health care provider if you should be screened.  Ask your health care provider whether you should take a calcium or vitamin D supplement to lower your risk for osteoporosis.  Menopause may have certain physical symptoms and risks.  Hormone replacement therapy may reduce some of these symptoms and risks. Talk to your health care provider about whether hormone replacement therapy is right for you. Follow these instructions at home:  Schedule regular health, dental, and eye exams.  Stay current with your immunizations.  Do not use any tobacco products including cigarettes, chewing tobacco, or electronic cigarettes.  If you are pregnant, do not drink alcohol.  If you are  breastfeeding, limit how much and how often you drink alcohol.  Limit alcohol intake to no more than 1 drink per day for nonpregnant women. One drink equals 12 ounces of beer, 5 ounces of wine, or 1 ounces of hard liquor.  Do not use street drugs.  Do not share needles.  Ask your health care provider for help if you need support or information about quitting drugs.  Tell your health care provider if you often feel depressed.  Tell your health care provider if you have ever been abused or do not feel safe at home. This information is not intended to replace advice given to you by your health care provider. Make sure you discuss any questions you have with your health care provider. Document Released: 04/18/2011 Document Revised: 03/10/2016 Document Reviewed: 07/07/2015 Elsevier Interactive Patient Education  [employer].

## 2018-05-25 ENCOUNTER — Ambulatory Visit (INDEPENDENT_AMBULATORY_CARE_PROVIDER_SITE_OTHER): Payer: Medicare Other | Admitting: Emergency Medicine

## 2018-05-25 ENCOUNTER — Encounter: Payer: Self-pay | Admitting: Emergency Medicine

## 2018-05-25 DIAGNOSIS — J449 Chronic obstructive pulmonary disease, unspecified: Secondary | ICD-10-CM

## 2018-05-25 DIAGNOSIS — I2510 Atherosclerotic heart disease of native coronary artery without angina pectoris: Secondary | ICD-10-CM | POA: Diagnosis not present

## 2018-05-25 DIAGNOSIS — R0600 Dyspnea, unspecified: Secondary | ICD-10-CM

## 2018-05-25 DIAGNOSIS — R0609 Other forms of dyspnea: Secondary | ICD-10-CM | POA: Diagnosis not present

## 2018-05-25 DIAGNOSIS — R0602 Shortness of breath: Secondary | ICD-10-CM | POA: Diagnosis not present

## 2018-05-25 LAB — PULMONARY FUNCTION TEST
DL/VA % pred: 81 %
DL/VA: 3.79 ml/min/mmHg/L
DLCO unc % pred: 60 %
DLCO unc: 13.62 ml/min/mmHg
FEF 25-75 Post: 0.93 L/sec
FEF 25-75 Pre: 1.4 L/sec
FEF2575-%Change-Post: -33 %
FEF2575-%Pred-Post: 51 %
FEF2575-%Pred-Pre: 76 %
FEV1-%Change-Post: -8 %
FEV1-%Pred-Post: 68 %
FEV1-%Pred-Pre: 74 %
FEV1-Post: 1.46 L
FEV1-Pre: 1.59 L
FEV1FVC-%Change-Post: 0 %
FEV1FVC-%Pred-Pre: 102 %
FEV6-%Change-Post: -8 %
FEV6-%Pred-Post: 69 %
FEV6-%Pred-Pre: 75 %
FEV6-Post: 1.88 L
FEV6-Pre: 2.05 L
FEV6FVC-%Pred-Post: 105 %
FEV6FVC-%Pred-Pre: 105 %
FVC-%Change-Post: -8 %
FVC-%Pred-Post: 66 %
FVC-%Pred-Pre: 71 %
FVC-Post: 1.88 L
FVC-Pre: 2.05 L
Post FEV1/FVC ratio: 78 %
Post FEV6/FVC ratio: 100 %
Pre FEV1/FVC ratio: 78 %
Pre FEV6/FVC Ratio: 100 %
RV % pred: 177 %
RV: 3.78 L
TLC % pred: 119 %
TLC: 5.83 L

## 2018-05-25 MED ORDER — TIOTROPIUM BROMIDE MONOHYDRATE 2.5 MCG/ACT IN AERS: 2.0000 | INHALATION_SPRAY | Freq: Every day | RESPIRATORY_TRACT | 0 refills | Status: DC

## 2018-05-25 NOTE — Progress Notes (Signed)
Subjective:    Patient ID: Tracy [name], female    DOB: [birth year], 72 y.o.   MRN: [id number]  HPI 72 year old obese woman, former smoker (40+ pack years) with a history of coronary artery disease with recent stenting to her circumflex 10/2017, hypertension with evidence of diastolic dysfunction 93/81/8299, hyperlipidemia.    She has experienced exertional dyspnea in Summer 2018, was a bit better after she had new stent place 10/2017, but still persisted with long walks. Participated in cardiac rehab and has improved even more, but she still has limitations in her function, especially when she is not holding a cart or walker. She has intermittent cough, especially bothersome when she has a URI. She has been rx for bronchitis or a flare w abx  before, last was Spring 2019. She has put on 25 lbs in the last year.    She does not sleep well, wakes up freq. She does not snore much. No witnessed apneas.   ROV 05/25/18 --this is a follow-up visit for dyspnea and patient with a history of tobacco use, coronary disease (stent circumflex 10/2017), hypertension with diastolic CHF.  We performed pulmonary function testing today that I have reviewed.  This shows evidence for mixed obstruction and restriction.  Her FEV1 is moderately reduced at 74% predicted.  There is no bronchodilator response.  Lung volumes are hyperinflated.  Her diffusion capacity is decreased but corrects to the normal range when adjusted for her alveolar volume. She reports that her mobility has been limited by her joint pain. Her breathing is largely unchanged. She has SOB w exertion, walking to car.    Review of Systems  Constitutional: Positive for unexpected weight change. Negative for fever.  HENT: Positive for dental problem and ear pain. Negative for congestion, nosebleeds, postnasal drip, rhinorrhea, sinus pressure, sneezing, sore throat and trouble swallowing.   Eyes: Negative for redness and itching.  Respiratory: Positive  for cough. Negative for chest tightness, shortness of breath and wheezing.   Cardiovascular: Negative for palpitations and leg swelling.  Gastrointestinal: Negative for nausea and vomiting.  Genitourinary: Negative for dysuria.  Musculoskeletal: Positive for joint swelling.  Skin: Negative for rash.  Neurological: Positive for headaches.  Hematological: Does not bruise/bleed easily.  Psychiatric/Behavioral: Negative for dysphoric mood. The patient is nervous/anxious.     Past Medical History:  Diagnosis Date  . Allergic rhinitis   . CAD (coronary artery disease)    1/19 PCI/DES to [place] for ISR, normal EF.   Marland Kitchen CHF (congestive heart failure) ([facility])   . Gout   . Headache(784.0)   . Hyperlipidemia   . Hypertension   . Low back pain   . Menopausal syndrome   . Myocardial infarct ([facility]) 2007   hx of  . Overactive bladder      Family History  Adopted: Yes  Problem Relation Age of Onset  . Colon cancer Mother 71  . Other Unknown        patient is adopted  . Stomach cancer Neg Hx      Social History   Socioeconomic History  . Marital status: Married    Spouse name: Not on file  . Number of children: Not on file  . Years of education: Not on file  . Highest education level: Not on file  Occupational History  . Occupation: direct Civil engineer, contracting  Social Needs  . Financial resource strain: Not on file  . Food insecurity:    Worry: Not on file  Inability: Not on file  . Transportation needs:    Medical: No    Non-medical: No  Tobacco Use  . Smoking status: Former Smoker    Packs/day: 0.20    Years: 50.00    Pack years: 10.00    Last attempt to quit: 07/01/2012    Years since quitting: 5.9  . Smokeless tobacco: Never Used  . Tobacco comment: completely quit May of 2018; period of years she did not smoke   Substance and Sexual Activity  . Alcohol use: No  . Drug use: No  . Sexual activity: Not on file  Lifestyle  . Physical activity:    Days per week: 0  days    Minutes per session: 0 min  . Stress: Only a little  Relationships  . Social connections:    Talks on phone: Not on file    Gets together: Not on file    Attends religious service: Not on file    Active member of club or organization: Not on file    Attends meetings of clubs or organizations: Not on file    Relationship status: Not on file  . Intimate partner violence:    Fear of current or ex partner: Not on file    Emotionally abused: Not on file    Physically abused: Not on file    Forced sexual activity: Not on file  Other Topics Concern  . Not on file  Social History Narrative  . Not on file   from [place], has worked at [employer], Press photographer.  Lives in an old house, no current water damage, no mold.  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    Allergies  Allergen Reactions  . Erythromycin Rash    But isn't certain  . Sulfamethoxazole Rash     Outpatient Medications Prior to Visit  Medication Sig Dispense Refill  . allopurinol (ZYLOPRIM) 100 MG tablet TAKE 1 TABLET BY MOUTH EVERY DAY 90 tablet 0  . ALPRAZolam (XANAX) 0.25 MG tablet TAKE 1 TABLET TWICE A DAY AS NEEDED FOR ANXIETY 60 tablet 0  . aspirin EC 81 MG tablet Take 81 mg by mouth daily.    Marland Kitchen atorvastatin (LIPITOR) 40 MG tablet TAKE 1 TABLET DAILY BY MOUTH. PLEASE KEEP UPCOMING APPT FOR FUTURE REFILLS. THANKS 90 tablet 3  . calcium carbonate (CALCIUM 600) 600 MG TABS tablet Take 600 mg by mouth See admin instructions. Once every 2 weeks    . cetirizine (ZYRTEC) 10 MG tablet Take 10 mg by mouth daily.    . Cholecalciferol (VITAMIN D3 PO) Take 1,000 Units by mouth daily.    . clopidogrel (PLAVIX) 75 MG tablet Take 1 tablet (75 mg total) by mouth daily with breakfast. 90 tablet 2  . Coenzyme Q10 (CO Q 10 PO) Take 300 mg by mouth See admin instructions. Once every 2 weeks.    . Cyanocobalamin (VITAMIN B-12) 5000 MCG SUBL Place 10,000 mcg under the tongue daily.     . furosemide (LASIX) 40 MG tablet Take 1 tablet (40 mg  total) by mouth daily. 90 tablet 3  . HYDROcodone-acetaminophen (NORCO/VICODIN) 5-325 MG tablet Take 1 tablet by mouth every 6 (six) hours as needed for moderate pain.    Marland Kitchen losartan (COZAAR) 100 MG tablet TAKE 1 TABLET (100 MG TOTAL) BY MOUTH DAILY. 90 tablet 3  . metoprolol tartrate (LOPRESSOR) 100 MG tablet Take 1 tablet (100 mg total) by mouth as directed. Please take 100 mg in the morning and 50 mg  at bedtime 180 tablet 3  . Misc Natural Products (OSTEO BI-FLEX ADV JOINT SHIELD PO) Take by mouth.    . Misc Natural Products (TART CHERRY ADVANCED) CAPS Take 1,000 capsules by mouth daily.     . nitroGLYCERIN (NITROSTAT) 0.4 MG SL tablet Place 1 tablet (0.4 mg total) under the tongue every 5 (five) minutes as needed for chest pain. 25 tablet 3  . Omega-3 Fatty Acids (FISH OIL OMEGA-3 PO) Take 1,400 mg by mouth See admin instructions. Once every couple weeks    . pantoprazole (PROTONIX) 40 MG tablet TAKE 1 TABLET (40 MG TOTAL) BY MOUTH DAILY. 90 tablet 1  . potassium chloride (K-DUR) 10 MEQ tablet Take 1 tablet (10 mEq total) by mouth daily. 90 tablet 3  . vitamin E 200 UNIT capsule Take 200 Units by mouth daily.     No facility-administered medications prior to visit.         Objective:   Physical Exam Vitals:   05/25/18 1149  BP: 112/80  Pulse: 81  SpO2: 92%  Weight: 213 lb 12.8 oz (97 kg)  Height: 5' 2.8" (1.595 m)   Gen: Pleasant, overwt, in no distress,  normal affect  ENT: No lesions,  mouth clear,  oropharynx clear, no postnasal drip  Neck: No JVD, no stridor  Lungs: No use of accessory muscles, slightly distant, no wheeze or crackles.   Cardiovascular: RRR, heart sounds normal, no murmur or gallops, no peripheral edema  Musculoskeletal: No deformities, no cyanosis or clubbing  Neuro: alert, non focal  Skin: Warm, no lesions or rash     Assessment & Plan:  Dyspnea on exertion Likely multifactorial but clearly based on her pulmonary function testing from today there  is an obstructive component.  We will try long-acting bronchodilator as described  COPD (chronic obstructive pulmonary disease) (HCC) Obstruction identified by pulmonary function testing today.  No bronchodilator response.  She is quite hyperinflated.  I will start her on Spiriva Respimat.  She is concerned about cost.  If she benefits and if the Spiriva is too expensive then we may have to consider nebulized therapy.  She will let me know if she benefits and if so we will start the process to order through her pharmacy.  We will try starting Spiriva Respimat, 2 sprays once daily every day to see if you benefit from this medication.  If you do benefit then call our office and we will work on prescribing this for you through your pharmacy. We will prescribe albuterol.  This is a rescue inhaler to be used if you have sudden shortness of breath, wheezing, chest tightness.  Use 2 puffs up to every 4 hours if needed for the symptoms. Agree with your efforts to increase your exercise and conditioning. Follow with Dr Lamonte Sakai in 2 months or sooner if you have any problems  Baltazar Apo, MD, PhD 05/25/2018, 12:22 PM [facility] [phone] or if no answer [phone]

## 2018-05-25 NOTE — Progress Notes (Signed)
PFT completed today. 05/25/18  

## 2018-05-25 NOTE — Assessment & Plan Note (Signed)
Obstruction identified by pulmonary function testing today.  No bronchodilator response.  She is quite hyperinflated.  I will start her on Spiriva Respimat.  She is concerned about cost.  If she benefits and if the Spiriva is too expensive then we may have to consider nebulized therapy.  She will let me know if she benefits and if so we will start the process to order through her pharmacy.  We will try starting Spiriva Respimat, 2 sprays once daily every day to see if you benefit from this medication.  If you do benefit then call our office and we will work on prescribing this for you through your pharmacy. We will prescribe albuterol.  This is a rescue inhaler to be used if you have sudden shortness of breath, wheezing, chest tightness.  Use 2 puffs up to every 4 hours if needed for the symptoms. Agree with your efforts to increase your exercise and conditioning. Follow with Dr Lamonte Sakai in 2 months or sooner if you have any problems

## 2018-05-25 NOTE — Assessment & Plan Note (Signed)
Likely multifactorial but clearly based on her pulmonary function testing from today there is an obstructive component.  We will try long-acting bronchodilator as described

## 2018-05-25 NOTE — Patient Instructions (Addendum)
We will try starting Spiriva Respimat, 2 sprays once daily every day to see if you benefit from this medication.  If you do benefit then call our office and we will work on prescribing this for you through your pharmacy. We will prescribe albuterol.  This is a rescue inhaler to be used if you have sudden shortness of breath, wheezing, chest tightness.  Use 2 puffs up to every 4 hours if needed for the symptoms. Agree with your efforts to increase your exercise and conditioning. Follow with Dr Lamonte Sakai in 2 months or sooner if you have any problems.

## 2018-05-28 ENCOUNTER — Ambulatory Visit: Payer: Medicare Other | Admitting: Internal Medicine

## 2018-05-30 ENCOUNTER — Encounter: Payer: Self-pay | Admitting: Internal Medicine

## 2018-05-30 ENCOUNTER — Ambulatory Visit (INDEPENDENT_AMBULATORY_CARE_PROVIDER_SITE_OTHER): Payer: Medicare Other | Admitting: Internal Medicine

## 2018-05-30 VITALS — BP 112/70 | HR 81 | Temp 98.0°F | Wt 219.6 lb

## 2018-05-30 DIAGNOSIS — J449 Chronic obstructive pulmonary disease, unspecified: Secondary | ICD-10-CM

## 2018-05-30 DIAGNOSIS — I2510 Atherosclerotic heart disease of native coronary artery without angina pectoris: Secondary | ICD-10-CM | POA: Diagnosis not present

## 2018-05-30 DIAGNOSIS — I10 Essential (primary) hypertension: Secondary | ICD-10-CM

## 2018-05-30 MED ORDER — ALPRAZOLAM 0.25 MG PO TABS: ORAL_TABLET | ORAL | 0 refills | Status: DC

## 2018-05-30 MED ORDER — HYDROCODONE-ACETAMINOPHEN 5-325 MG PO TABS: 1.0000 | ORAL_TABLET | Freq: Four times a day (QID) | ORAL | 0 refills | Status: DC | PRN

## 2018-05-30 NOTE — Patient Instructions (Signed)
Limit your sodium (Salt) intake  You need to lose weight.  Consider a lower calorie diet and regular exercise.    It is important that you exercise regularly, at least 20 minutes 3 to 4 times per week.  If you develop chest pain or shortness of breath seek  medical attention.  Return in 6 months for follow-up  Please check your blood pressure on a regular basis.  If it is consistently greater than 150/90, please make an office appointment.

## 2018-05-30 NOTE — Progress Notes (Signed)
Subjective:    Patient ID: Tracy [name], female    DOB: 1946/06/15, 72 y.o.   MRN: [id number]  HPI  72 year old patient who is seen today for follow-up. She has a history of coronary artery disease as well as diastolic heart failure.  Since her last visit here she has been evaluated by pulmonary medicine and has been diagnosed with COPD.  She seems to be doing fairly well and now is on Spiriva. She has no cardiopulmonary complaints.  Complaints include knee pain tingling involving the lower extremities and feet.  Past Medical History:  Diagnosis Date  . Allergic rhinitis   . CAD (coronary artery disease)    1/19 PCI/DES to [facility] for ISR, normal EF.   Marland Kitchen CHF (congestive heart failure) ([facility])   . Gout   . Headache(784.0)   . Hyperlipidemia   . Hypertension   . Low back pain   . Menopausal syndrome   . Myocardial infarct ([facility]) 2007   hx of  . Overactive bladder      Social History   Socioeconomic History  . Marital status: Married    Spouse name: Not on file  . Number of children: Not on file  . Years of education: Not on file  . Highest education level: Not on file  Occupational History  . Occupation: direct Civil engineer, contracting  Social Needs  . Financial resource strain: Not on file  . Food insecurity:    Worry: Not on file    Inability: Not on file  . Transportation needs:    Medical: No    Non-medical: No  Tobacco Use  . Smoking status: Former Smoker    Packs/day: 0.20    Years: 50.00    Pack years: 10.00    Last attempt to quit: 07/01/2012    Years since quitting: 5.9  . Smokeless tobacco: Never Used  . Tobacco comment: completely quit May of 2018; period of years she did not smoke   Substance and Sexual Activity  . Alcohol use: No  . Drug use: No  . Sexual activity: Not on file  Lifestyle  . Physical activity:    Days per week: 0 days    Minutes per session: 0 min  . Stress: Only a little  Relationships  . Social connections:    Talks on  phone: Not on file    Gets together: Not on file    Attends religious service: Not on file    Active member of club or organization: Not on file    Attends meetings of clubs or organizations: Not on file    Relationship status: Not on file  . Intimate partner violence:    Fear of current or ex partner: Not on file    Emotionally abused: Not on file    Physically abused: Not on file    Forced sexual activity: Not on file  Other Topics Concern  . Not on file  Social History Narrative  . Not on file    Past Surgical History:  Procedure Laterality Date  . ANGIOPLASTY     stent 2007  . CARDIAC CATHETERIZATION    . COLONOSCOPY    . CORONARY STENT INTERVENTION N/A 11/06/2017   Procedure: CORONARY STENT INTERVENTION;  Surgeon: [name], MD;  Location: [facility];  Service: Cardiovascular;  Laterality: N/A;  . LEFT HEART CATH AND CORONARY ANGIOGRAPHY N/A 11/06/2017   Procedure: LEFT HEART CATH AND CORONARY ANGIOGRAPHY;  Surgeon: [name], MD;  Location: [facility];  Service: Cardiovascular;  Laterality: N/A;  . TONSILLECTOMY      Family History  Adopted: Yes  Problem Relation Age of Onset  . Colon cancer Mother 24  . Other Unknown        patient is adopted  . Stomach cancer Neg Hx     Allergies  Allergen Reactions  . Erythromycin Rash    But isn't certain  . Sulfamethoxazole Rash    Current Outpatient Medications on File Prior to Visit  Medication Sig Dispense Refill  . allopurinol (ZYLOPRIM) 100 MG tablet TAKE 1 TABLET BY MOUTH EVERY DAY 90 tablet 0  . aspirin EC 81 MG tablet Take 81 mg by mouth daily.    Marland Kitchen atorvastatin (LIPITOR) 40 MG tablet TAKE 1 TABLET DAILY BY MOUTH. PLEASE KEEP UPCOMING APPT FOR FUTURE REFILLS. THANKS 90 tablet 3  . calcium carbonate (CALCIUM 600) 600 MG TABS tablet Take 600 mg by mouth See admin instructions. Once every 2 weeks    . cetirizine (ZYRTEC) 10 MG tablet Take 10 mg by mouth daily.    . Cholecalciferol  (VITAMIN D3 PO) Take 1,000 Units by mouth daily.    . clopidogrel (PLAVIX) 75 MG tablet Take 1 tablet (75 mg total) by mouth daily with breakfast. 90 tablet 2  . Coenzyme Q10 (CO Q 10 PO) Take 300 mg by mouth See admin instructions. Once every 2 weeks.    . Cyanocobalamin (VITAMIN B-12) 5000 MCG SUBL Place 10,000 mcg under the tongue daily.     . furosemide (LASIX) 40 MG tablet Take 1 tablet (40 mg total) by mouth daily. 90 tablet 3  . losartan (COZAAR) 100 MG tablet TAKE 1 TABLET (100 MG TOTAL) BY MOUTH DAILY. 90 tablet 3  . metoprolol tartrate (LOPRESSOR) 100 MG tablet Take 1 tablet (100 mg total) by mouth as directed. Please take 100 mg in the morning and 50 mg at bedtime 180 tablet 3  . Misc Natural Products (OSTEO BI-FLEX ADV JOINT SHIELD PO) Take by mouth.    . Misc Natural Products (TART CHERRY ADVANCED) CAPS Take 1,000 capsules by mouth daily.     . nitroGLYCERIN (NITROSTAT) 0.4 MG SL tablet Place 1 tablet (0.4 mg total) under the tongue every 5 (five) minutes as needed for chest pain. 25 tablet 3  . Omega-3 Fatty Acids (FISH OIL OMEGA-3 PO) Take 1,400 mg by mouth See admin instructions. Once every couple weeks    . pantoprazole (PROTONIX) 40 MG tablet TAKE 1 TABLET (40 MG TOTAL) BY MOUTH DAILY. 90 tablet 1  . potassium chloride (K-DUR) 10 MEQ tablet Take 1 tablet (10 mEq total) by mouth daily. 90 tablet 3  . Tiotropium Bromide Monohydrate (SPIRIVA RESPIMAT) 2.5 MCG/ACT AERS Inhale 2 puffs into the lungs daily. 2 Inhaler 0  . vitamin E 200 UNIT capsule Take 200 Units by mouth daily.     No current facility-administered medications on file prior to visit.     BP 112/70 (BP Location: Left Arm, Patient Position: Sitting, Cuff Size: Large)   Pulse 81   Temp 98 F (36.7 C) (Oral)   Wt 219 lb 9.6 oz (99.6 kg)   SpO2 95%   BMI 39.15 kg/m     Review of Systems  Constitutional: Negative.   HENT: Negative for congestion, dental problem, hearing loss, rhinorrhea, sinus pressure, sore  throat and tinnitus.   Eyes: Negative for pain, discharge and visual disturbance.  Respiratory: Negative for cough and shortness of breath.  Cardiovascular: Negative for chest pain, palpitations and leg swelling.  Gastrointestinal: Negative for abdominal distention, abdominal pain, blood in stool, constipation, diarrhea, nausea and vomiting.  Genitourinary: Negative for difficulty urinating, dysuria, flank pain, frequency, hematuria, pelvic pain, urgency, vaginal bleeding, vaginal discharge and vaginal pain.  Musculoskeletal: Positive for arthralgias. Negative for gait problem and joint swelling.  Skin: Negative for rash.  Neurological: Positive for numbness. Negative for dizziness, syncope, speech difficulty, weakness and headaches.  Hematological: Negative for adenopathy.  Psychiatric/Behavioral: Negative for agitation, behavioral problems and dysphoric mood. The patient is not nervous/anxious.        Objective:   Physical Exam  Constitutional: She is oriented to person, place, and time. She appears well-developed and well-nourished.  Blood pressure well controlled  HENT:  Head: Normocephalic.  Right Ear: External ear normal.  Left Ear: External ear normal.  Mouth/Throat: Oropharynx is clear and moist.  Eyes: Pupils are equal, round, and reactive to light. Conjunctivae and EOM are normal.  Neck: Normal range of motion. Neck supple. No thyromegaly present.  Cardiovascular: Normal rate, regular rhythm, normal heart sounds and intact distal pulses.  Pulmonary/Chest: Effort normal and breath sounds normal.  Abdominal: Soft. Bowel sounds are normal. She exhibits no mass. There is no tenderness.  Musculoskeletal: Normal range of motion. She exhibits no edema.  Lymphadenopathy:    She has no cervical adenopathy.  Neurological: She is alert and oriented to person, place, and time.  Skin: Skin is warm and dry. No rash noted.  Psychiatric: She has a normal mood and affect. Her behavior is  normal.          Assessment & Plan:  Coronary artery disease History of diastolic heart failure Left ventricular dysfunction COPD  No change in medical regimen Medications updated Follow-up 6 months  Marletta Lor

## 2018-06-15 ENCOUNTER — Other Ambulatory Visit: Payer: Self-pay | Admitting: Internal Medicine

## 2018-06-19 ENCOUNTER — Encounter: Payer: Medicare Other | Admitting: Family Medicine

## 2018-06-22 ENCOUNTER — Telehealth: Payer: Self-pay | Admitting: Emergency Medicine

## 2018-06-22 NOTE — Telephone Encounter (Signed)
Patient called to report her pharmacy would not cover Spiriva Respimart. She states she was told we would help her with affording it. Patient was instructed to call the number on the back of her card to find out what was covered. Patient assistance application was printed and placed upfront to be mailed to patient. Patient was informed I would get this message to RB however he will not be back into the office until Monday. Patient voiced understanding.  RB please advise.

## 2018-06-26 NOTE — Telephone Encounter (Signed)
Called spoke with patient to see if she has reached out to her insurance to check for covered alternatives - she has not Discussed RB's recommendations as stated below Patient stated that she understands  Patient did not clarify whether or not she will call her insurance for covered alternatives/request formulary  Will call patient back in a few days to check back with her

## 2018-06-26 NOTE — Telephone Encounter (Signed)
Okay with me to look into options for possible financial assistance from the pharmaceutical company to help cover the cost of Spiriva Respimat.  I do not believe I can fill out an exception request unless she is failed her formulary alternatives.  If she would like to trial of the alternatives then please have her get [place] the formulary and we may be able to make a substitution.

## 2018-06-27 ENCOUNTER — Encounter: Payer: Self-pay | Admitting: Family Medicine

## 2018-06-27 ENCOUNTER — Ambulatory Visit (INDEPENDENT_AMBULATORY_CARE_PROVIDER_SITE_OTHER): Payer: Medicare Other | Admitting: Family Medicine

## 2018-06-27 VITALS — BP 120/78 | HR 92 | Temp 98.1°F | Resp 16 | Ht 62.8 in | Wt 215.1 lb

## 2018-06-27 DIAGNOSIS — N183 Chronic kidney disease, stage 3 unspecified: Secondary | ICD-10-CM | POA: Insufficient documentation

## 2018-06-27 DIAGNOSIS — I10 Essential (primary) hypertension: Secondary | ICD-10-CM | POA: Diagnosis not present

## 2018-06-27 DIAGNOSIS — M109 Gout, unspecified: Secondary | ICD-10-CM

## 2018-06-27 DIAGNOSIS — I2510 Atherosclerotic heart disease of native coronary artery without angina pectoris: Secondary | ICD-10-CM

## 2018-06-27 DIAGNOSIS — M170 Bilateral primary osteoarthritis of knee: Secondary | ICD-10-CM

## 2018-06-27 DIAGNOSIS — J449 Chronic obstructive pulmonary disease, unspecified: Secondary | ICD-10-CM | POA: Diagnosis not present

## 2018-06-27 DIAGNOSIS — K219 Gastro-esophageal reflux disease without esophagitis: Secondary | ICD-10-CM

## 2018-06-27 LAB — BASIC METABOLIC PANEL
BUN: 27 mg/dL — ABNORMAL HIGH (ref 6–23)
CO2: 28 mEq/L (ref 19–32)
Calcium: 9.2 mg/dL (ref 8.4–10.5)
Chloride: 106 mEq/L (ref 96–112)
Creatinine, Ser: 1.48 mg/dL — ABNORMAL HIGH (ref 0.40–1.20)
GFR: 37.03 mL/min — ABNORMAL LOW (ref >60.00)
Glucose, Bld: 70 mg/dL (ref 70–99)
Potassium: 4.7 mEq/L (ref 3.5–5.1)
Sodium: 142 mEq/L (ref 135–145)

## 2018-06-27 LAB — VITAMIN D 25 HYDROXY (VIT D DEFICIENCY, FRACTURES): VITD: 45.05 ng/mL (ref 30.00–100.00)

## 2018-06-27 LAB — MICROALBUMIN / CREATININE URINE RATIO
Creatinine,U: 189.4 mg/dL
Microalb Creat Ratio: 0.9 mg/g (ref 0.0–30.0)
Microalb, Ur: 1.7 mg/dL (ref 0.0–1.9)

## 2018-06-27 MED ORDER — DICLOFENAC SODIUM 1 % TD GEL: 4.0000 g | Freq: Four times a day (QID) | TRANSDERMAL | 3 refills | Status: DC

## 2018-06-27 MED ORDER — IPRATROPIUM-ALBUTEROL 20-100 MCG/ACT IN AERS: 1.0000 | INHALATION_SPRAY | Freq: Three times a day (TID) | RESPIRATORY_TRACT | 4 refills | Status: DC | PRN

## 2018-06-27 MED ORDER — PANTOPRAZOLE SODIUM 20 MG PO TBEC: 20.0000 mg | DELAYED_RELEASE_TABLET | Freq: Every day | ORAL | 1 refills | Status: DC

## 2018-06-27 NOTE — Progress Notes (Signed)
HPI:   Tracy [name] is a 72 y.o. female, who is here today with her husband to establish care.  Former PCP: Dr. Burnice Logan. Last preventive routine visit: 05/2018.  Chronic medical problems: Hyperlipidemia, hypertension, CAD, GERD, tobacco use, OA, diastolic dysfunction, exertional dyspnea, and gout among some.  He follows with cardiologist and pulmonologist for CAD and dyspnea respectively.  Vit D deficiency,she is on OTC Vit D 2000 U.  HypoK+,last dose of KCL was 3 days ago.She wonders if she still needs it. She is on Furosemide 40 mg,which she takes prn,it was prescribed daily but it causes urinary frequency and urgency.  LE edema since 07/2017,worse at the end of the end of the day,bilateral. Distal legs achy pain,no erythema.  Denis orthopnea,sleeps in 1-2 pillows. No PND or worsening LE edema.   HTN: She is on Losartan 100 mg and Metoprolol Tartrate 100 mg am and 50 mg pm. Negative for chest pain,dyspnea,diaphoresis,or focal deficit.  Anxiety: She is on Alprazolam 0.25 mg daily prn. In average she takes 1-2 per week. She has taken medication for " a while." It does help with acute anxiety.  Concerns today:   Knee pain for a while. She used to have intra articular injections, she has not followed with ortho. R>L, exacerbated by waking stairs,prolonged standing,walking. Alleviated by rest.  Hx of gout , she is on Allopurinol 100 mg . Usually affects feet, last gout a while ago.  OA: Hands,toes, knees,and hands. Takes Hydrocodone-Acetaminophen 5-325 mg ocassionally. Recently started Hemp 10,000 mg, she is not sure if it is helping..  -Spiriva Respimat not covered by her health insurance. Hx of exertional SOB,recently evaluated by pulmonologist. Sample of inhaer given,which has helped but she cannot afford it.  Exacerbated by exercise and alleviated by rest. No associated CP or dizziness.  GERD: She is omn Protonix 40 mg daily,she has  tried to stop it but starts with symptoms again,heartburn and achy epigastrium.  Denies nausea, vomiting, changes in bowel habits, blood in stool or melena.  Review of Systems  Constitutional: Positive for fatigue. Negative for activity change, appetite change and fever.  HENT: Negative for mouth sores, nosebleeds and trouble swallowing.   Eyes: Negative for redness and visual disturbance.  Respiratory: Positive for shortness of breath. Negative for cough and wheezing.   Cardiovascular: Positive for leg swelling. Negative for chest pain and palpitations.  Gastrointestinal: Negative for abdominal pain, nausea and vomiting.       Negative for changes in bowel habits.  Genitourinary: Negative for decreased urine volume, dysuria and hematuria.  Musculoskeletal: Positive for arthralgias. Negative for joint swelling.  Skin: Negative for rash and wound.  Neurological: Negative for syncope, weakness and headaches.  Psychiatric/Behavioral: Negative for confusion. The patient is nervous/anxious.       Current Outpatient Medications on File Prior to Visit  Medication Sig Dispense Refill  . allopurinol (ZYLOPRIM) 100 MG tablet TAKE 1 TABLET BY MOUTH EVERY DAY 90 tablet 0  . ALPRAZolam (XANAX) 0.25 MG tablet TAKE 1 TABLET TWICE A DAY AS NEEDED FOR ANXIETY 60 tablet 0  . aspirin EC 81 MG tablet Take 81 mg by mouth daily.    Marland Kitchen atorvastatin (LIPITOR) 40 MG tablet TAKE 1 TABLET DAILY BY MOUTH. PLEASE KEEP UPCOMING APPT FOR FUTURE REFILLS. THANKS 90 tablet 3  . calcium carbonate (CALCIUM 600) 600 MG TABS tablet Take 600 mg by mouth See admin instructions. Once every 2 weeks    . cetirizine (ZYRTEC) 10 MG tablet  Take 10 mg by mouth daily.    . Cholecalciferol (VITAMIN D3 PO) Take 1,000 Units by mouth daily.    . clopidogrel (PLAVIX) 75 MG tablet Take 1 tablet (75 mg total) by mouth daily with breakfast. 90 tablet 2  . Coenzyme Q10 (CO Q 10 PO) Take 300 mg by mouth See admin instructions. Once every 2  weeks.    . Cyanocobalamin (VITAMIN B-12) 5000 MCG SUBL Place 10,000 mcg under the tongue daily.     . furosemide (LASIX) 40 MG tablet Take 1 tablet (40 mg total) by mouth daily. 90 tablet 3  . HYDROcodone-acetaminophen (NORCO/VICODIN) 5-325 MG tablet Take 1 tablet by mouth every 6 (six) hours as needed for moderate pain. 30 tablet 0  . losartan (COZAAR) 100 MG tablet TAKE 1 TABLET (100 MG TOTAL) BY MOUTH DAILY. 90 tablet 3  . metoprolol tartrate (LOPRESSOR) 100 MG tablet Take 1 tablet (100 mg total) by mouth as directed. Please take 100 mg in the morning and 50 mg at bedtime 180 tablet 3  . Misc Natural Products (OSTEO BI-FLEX ADV JOINT SHIELD PO) Take by mouth.    . Misc Natural Products (TART CHERRY ADVANCED) CAPS Take 1,000 capsules by mouth daily.     . nitroGLYCERIN (NITROSTAT) 0.4 MG SL tablet Place 1 tablet (0.4 mg total) under the tongue every 5 (five) minutes as needed for chest pain. 25 tablet 3  . NON FORMULARY 83.3 mg daily.    . Omega-3 Fatty Acids (FISH OIL OMEGA-3 PO) Take 1,400 mg by mouth See admin instructions. Once every couple weeks    . potassium chloride (K-DUR) 10 MEQ tablet Take 1 tablet (10 mEq total) by mouth daily. 90 tablet 3  . Tiotropium Bromide Monohydrate (SPIRIVA RESPIMAT) 2.5 MCG/ACT AERS Inhale 2 puffs into the lungs daily. 2 Inhaler 0  . vitamin E 200 UNIT capsule Take 200 Units by mouth as needed.      No current facility-administered medications on file prior to visit.      Past Medical History:  Diagnosis Date  . Allergic rhinitis   . CAD (coronary artery disease)    1/19 PCI/DES to [facility] for ISR, normal EF.   Marland Kitchen CHF (congestive heart failure) ([place])   . Gout   . Headache(784.0)   . Hyperlipidemia   . Hypertension   . Low back pain   . Menopausal syndrome   . Myocardial infarct ([place]) 2007   hx of  . Overactive bladder    Allergies  Allergen Reactions  . Erythromycin Rash    But isn't certain  . Sulfamethoxazole Rash    Family History    Adopted: Yes  Problem Relation Age of Onset  . Colon cancer Mother 58  . Other Unknown        patient is adopted  . Stomach cancer Neg Hx     Social History   Socioeconomic History  . Marital status: Married    Spouse name: Not on file  . Number of children: Not on file  . Years of education: Not on file  . Highest education level: Not on file  Occupational History  . Occupation: direct Civil engineer, contracting  Social Needs  . Financial resource strain: Not on file  . Food insecurity:    Worry: Not on file    Inability: Not on file  . Transportation needs:    Medical: No    Non-medical: No  Tobacco Use  . Smoking status: Former Smoker  Packs/day: 0.20    Years: 50.00    Pack years: 10.00    Last attempt to quit: 07/01/2012    Years since quitting: 6.0  . Smokeless tobacco: Never Used  . Tobacco comment: completely quit May of 2018; period of years she did not smoke   Substance and Sexual Activity  . Alcohol use: No  . Drug use: No  . Sexual activity: Not on file  Lifestyle  . Physical activity:    Days per week: 0 days    Minutes per session: 0 min  . Stress: Only a little  Relationships  . Social connections:    Talks on phone: Not on file    Gets together: Not on file    Attends religious service: Not on file    Active member of club or organization: Not on file    Attends meetings of clubs or organizations: Not on file    Relationship status: Not on file  Other Topics Concern  . Not on file  Social History Narrative  . Not on file    Vitals:   06/27/18 0835  BP: 120/78  Pulse: 92  Resp: 16  Temp: 98.1 F (36.7 C)  SpO2: 98%    Body mass index is 38.35 kg/m.   Physical Exam  Nursing note and vitals reviewed. Constitutional: She is oriented to person, place, and time. She appears well-developed. No distress.  HENT:  Head: Normocephalic and atraumatic.  Mouth/Throat: Oropharynx is clear and moist and mucous membranes are normal.  Eyes:  Pupils are equal, round, and reactive to light. Conjunctivae are normal.  Cardiovascular: Normal rate and regular rhythm.  No murmur heard. Pulses:      Dorsalis pedis pulses are 2+ on the right side, and 2+ on the left side.  Respiratory: Effort normal and breath sounds normal. No respiratory distress.  GI: Soft. She exhibits no mass. There is no hepatomegaly. There is no tenderness.  Musculoskeletal: She exhibits edema (1+ pitting LE edema,bilateral;.).  Antalgic gait. Right knee pain with range of motion. No edema or erythema,limitation of flexion.  Lymphadenopathy:    She has no cervical adenopathy.  Neurological: She is alert and oriented to person, place, and time. She has normal strength. No cranial nerve deficit. Gait normal.  Skin: Skin is warm. No rash noted. No erythema.  Psychiatric: Her mood appears anxious.  Well groomed, good eye contact.    ASSESSMENT AND PLAN:  Ms. Amirah was seen today for transfer of care.  Diagnoses and all orders for this visit:  Lab Results  Component Value Date   CREATININE 1.48 (H) 06/27/2018   BUN 27 (H) 06/27/2018   NA 142 06/27/2018   K 4.7 06/27/2018   CL 106 06/27/2018   CO2 28 06/27/2018    CKD (chronic kidney disease), stage III (HCC)  Dx and prognosis discussed. Avoid oral NSAID's. Low salt diet. Adequate hydration.  -     Basic metabolic panel -     VITAMIN D 25 Hydroxy (Vit-D Deficiency, Fractures) -     Microalbumin / creatinine urine ratio  Osteoarthritis of both knees, unspecified osteoarthritis type  Topical NSAID recommended. Tylenol 500 mg tid. She can continue Hemp extract for a few more weeks and monitor for changes.She could try topical treatment instead. Continue following with ortho.  -     diclofenac sodium (VOLTAREN) 1 % GEL; Apply 4 g topically 4 (four) times daily.  Chronic obstructive pulmonary disease, unspecified COPD type ([place])  Spiriva is  helping. She will try Combivent instead Spiriva  die to cost. Instructed about warning sings. Continue following with pulmonologist.  -     Ipratropium-Albuterol (COMBIVENT) 20-100 MCG/ACT AERS respimat; Inhale 1 puff into the lungs every 8 (eight) hours as needed for wheezing.   Gouty arthritis of toe of left foot Problem is well controlled with allopurinol 100 mg, so I recommend continuing same dose. Low purines diet also recommended.   GERD Stable. She will try to decrease dose of Protonix from 40 mg to 20 mg. Continue GERD precautions. We discussed some side effects of PPIs. Follow-up in 4 months.  Essential hypertension Adequately controlled. No changes in current management. DASH diet recommended. Eye exam recommended annually. F/U in 6 months, before if needed.     Diania Co G. [name], MD  [facility]. [place] office.

## 2018-06-27 NOTE — Patient Instructions (Addendum)
A few things to remember from today's visit:   CKD (chronic kidney disease), stage III ([place]) - Plan: Basic metabolic panel, VITAMIN D 25 Hydroxy (Vit-D Deficiency, Fractures), Microalbumin / creatinine urine ratio  Osteoarthritis of both knees, unspecified osteoarthritis type - Plan: diclofenac sodium (VOLTAREN) 1 % GEL  Essential hypertension  Gastroesophageal reflux disease, esophagitis presence not specified - Plan: pantoprazole (PROTONIX) 20 MG tablet  To preserve kidney function and/or slow progression of disease:  Low salt diet and adequate hydration. Avoiding medicines known as "nonsteroidal anti-inflammatory drugs," or NSAIDs. These medicines include ibuprofen (sample brand names: Advil, Motrin) and naproxen (sample brand name: Aleve).  Adequate blood pressure control. Low phosphorus diet.   Please be sure medication list is accurate. If a new problem present, please set up appointment sooner than planned today.

## 2018-06-27 NOTE — Assessment & Plan Note (Signed)
Stable. She will try to decrease dose of Protonix from 40 mg to 20 mg. Continue GERD precautions. We discussed some side effects of PPIs. Follow-up in 4 months.

## 2018-06-27 NOTE — Assessment & Plan Note (Signed)
Problem is well controlled with allopurinol 100 mg, so I recommend continuing same dose. Low purines diet also recommended.

## 2018-06-27 NOTE — Assessment & Plan Note (Signed)
Adequately controlled. No changes in current management. DASH diet recommended. Eye exam recommended annually. F/U in 6 months, before if needed.  

## 2018-07-01 ENCOUNTER — Encounter: Payer: Self-pay | Admitting: Family Medicine

## 2018-07-11 ENCOUNTER — Ambulatory Visit
Admission: RE | Admit: 2018-07-11 | Discharge: 2018-07-11 | Disposition: A | Discharge status: Home / Self Care | Payer: Medicare Other | Source: Ambulatory Visit | Attending: Internal Medicine | Admitting: Internal Medicine

## 2018-07-11 DIAGNOSIS — R921 Mammographic calcification found on diagnostic imaging of breast: Secondary | ICD-10-CM | POA: Diagnosis not present

## 2018-07-11 DIAGNOSIS — Z780 Asymptomatic menopausal state: Secondary | ICD-10-CM | POA: Diagnosis not present

## 2018-07-11 DIAGNOSIS — M8589 Other specified disorders of bone density and structure, multiple sites: Secondary | ICD-10-CM | POA: Diagnosis not present

## 2018-07-11 DIAGNOSIS — E2839 Other primary ovarian failure: Secondary | ICD-10-CM

## 2018-07-18 NOTE — Telephone Encounter (Signed)
LMTCB

## 2018-07-19 NOTE — Telephone Encounter (Signed)
Called and spoke with pt to check to see if she had been able to get a list of covered alternatives for RB to look at.  Pt stated she had not gotten a list. Stated to pt to call the insurance company and tell them that she needed the formulary list of covered alternatives so RB could look at to see which inhalers would be covered that is the same as the spiriva. Pt expressed understanding.   Will leave encounter open and check back later to see if pt has been able to get the list.

## 2018-07-20 NOTE — Telephone Encounter (Signed)
Called and spoke with patient. She doesn't have any list from insurance. She stated she told someone yesterday she would just follow up at her appointment on the 17th of October. If she got the list sooner she would bring it to the office.   Nothing further needed.  Routing to [place] as [employer]

## 2018-07-23 ENCOUNTER — Other Ambulatory Visit: Payer: Self-pay | Admitting: Internal Medicine

## 2018-07-31 ENCOUNTER — Ambulatory Visit (INDEPENDENT_AMBULATORY_CARE_PROVIDER_SITE_OTHER): Payer: Medicare Other

## 2018-07-31 DIAGNOSIS — Z23 Encounter for immunization: Secondary | ICD-10-CM

## 2018-08-02 ENCOUNTER — Encounter: Payer: Self-pay | Admitting: Emergency Medicine

## 2018-08-02 ENCOUNTER — Ambulatory Visit (INDEPENDENT_AMBULATORY_CARE_PROVIDER_SITE_OTHER): Payer: Medicare Other | Admitting: Emergency Medicine

## 2018-08-02 VITALS — BP 126/84 | HR 60 | Ht 62.0 in | Wt 215.8 lb

## 2018-08-02 DIAGNOSIS — I2510 Atherosclerotic heart disease of native coronary artery without angina pectoris: Secondary | ICD-10-CM

## 2018-08-02 DIAGNOSIS — J449 Chronic obstructive pulmonary disease, unspecified: Secondary | ICD-10-CM

## 2018-08-02 MED ORDER — UMECLIDINIUM BROMIDE 62.5 MCG/INH IN AEPB: 1.0000 | INHALATION_SPRAY | Freq: Every day | RESPIRATORY_TRACT | 5 refills | Status: DC

## 2018-08-02 MED ORDER — ALBUTEROL SULFATE HFA 108 (90 BASE) MCG/ACT IN AERS: 2.0000 | INHALATION_SPRAY | RESPIRATORY_TRACT | 5 refills | Status: DC | PRN

## 2018-08-02 NOTE — Progress Notes (Signed)
Subjective:    Patient ID: Tracy [name], female    DOB: [birth year], 72 y.o.   MRN: [id number]  Shortness of Breath  Associated symptoms include ear pain and headaches. Pertinent negatives include no fever, leg swelling, rash, rhinorrhea, sore throat, vomiting or wheezing.   72 year old obese woman, former smoker (40+ pack years) with a history of coronary artery disease with recent stenting to her circumflex 10/2017, hypertension with evidence of diastolic dysfunction 09/62/8366, hyperlipidemia.    She has experienced exertional dyspnea in Summer 2018, was a bit better after she had new stent place 10/2017, but still persisted with long walks. Participated in cardiac rehab and has improved even more, but she still has limitations in her function, especially when she is not holding a cart or walker. She has intermittent cough, especially bothersome when she has a URI. She has been rx for bronchitis or a flare w abx  before, last was Spring 2019. She has put on 25 lbs in the last year.    She does not sleep well, wakes up freq. She does not snore much. No witnessed apneas.   ROV 05/25/18 --this is a follow-up visit for dyspnea and patient with a history of tobacco use, coronary disease (stent circumflex 10/2017), hypertension with diastolic CHF.  We performed pulmonary function testing today that I have reviewed.  This shows evidence for mixed obstruction and restriction.  Her FEV1 is moderately reduced at 74% predicted.  There is no bronchodilator response.  Lung volumes are hyperinflated.  Her diffusion capacity is decreased but corrects to the normal range when adjusted for her alveolar volume. She reports that her mobility has been limited by her joint pain. Her breathing is largely unchanged. She has SOB w exertion, walking to car.   ROV 08/02/18 --patient follows up today for her history of COPD (mixed obstruction and restriction), coronary disease with hypertension and diastolic CHF.  I saw  her 2 months ago and we decided to try starting long-acting bronchodilator for her contribution from COPD.  We tried Spiriva Respimat.  She reports that she believes that it did help her breathing and her functional capacity. It is too costly, her formulary apparently prefers Incruse (and trelegy, bevespi). She had her flu shot. She is up to date on PNA shot.    Review of Systems  Constitutional: Positive for unexpected weight change. Negative for fever.  HENT: Positive for dental problem and ear pain. Negative for congestion, nosebleeds, postnasal drip, rhinorrhea, sinus pressure, sneezing, sore throat and trouble swallowing.   Eyes: Negative for redness and itching.  Respiratory: Positive for cough and shortness of breath. Negative for chest tightness and wheezing.   Cardiovascular: Negative for palpitations and leg swelling.  Gastrointestinal: Negative for nausea and vomiting.  Genitourinary: Negative for dysuria.  Musculoskeletal: Positive for joint swelling.  Skin: Negative for rash.  Neurological: Positive for headaches.  Hematological: Does not bruise/bleed easily.  Psychiatric/Behavioral: Negative for dysphoric mood. The patient is nervous/anxious.     Past Medical History:  Diagnosis Date  . Allergic rhinitis   . CAD (coronary artery disease)    1/19 PCI/DES to [facility] for ISR, normal EF.   Marland Kitchen CHF (congestive heart failure) ([place])   . Gout   . Headache(784.0)   . Hyperlipidemia   . Hypertension   . Low back pain   . Menopausal syndrome   . Myocardial infarct ([place]) 2007   hx of  . Overactive bladder      Family History  Adopted: Yes  Problem Relation Age of Onset  . Colon cancer Mother 73  . Other Unknown        patient is adopted  . Stomach cancer Neg Hx      Social History   Socioeconomic History  . Marital status: Married    Spouse name: Not on file  . Number of children: Not on file  . Years of education: Not on file  . Highest education level: Not on file    Occupational History  . Occupation: direct Civil engineer, contracting  Social Needs  . Financial resource strain: Not on file  . Food insecurity:    Worry: Not on file    Inability: Not on file  . Transportation needs:    Medical: No    Non-medical: No  Tobacco Use  . Smoking status: Former Smoker    Packs/day: 0.20    Years: 50.00    Pack years: 10.00    Last attempt to quit: 07/01/2012    Years since quitting: 6.0  . Smokeless tobacco: Never Used  . Tobacco comment: completely quit May of 2018; period of years she did not smoke   Substance and Sexual Activity  . Alcohol use: No  . Drug use: No  . Sexual activity: Not on file  Lifestyle  . Physical activity:    Days per week: 0 days    Minutes per session: 0 min  . Stress: Only a little  Relationships  . Social connections:    Talks on phone: Not on file    Gets together: Not on file    Attends religious service: Not on file    Active member of club or organization: Not on file    Attends meetings of clubs or organizations: Not on file    Relationship status: Not on file  . Intimate partner violence:    Fear of current or ex partner: Not on file    Emotionally abused: Not on file    Physically abused: Not on file    Forced sexual activity: Not on file  Other Topics Concern  . Not on file  Social History Narrative  . Not on file   from [place], has worked at [employer], Press photographer.  Lives in an old house, no current water damage, no mold.  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    Allergies  Allergen Reactions  . Erythromycin Rash    But isn't certain  . Sulfamethoxazole Rash     Outpatient Medications Prior to Visit  Medication Sig Dispense Refill  . allopurinol (ZYLOPRIM) 100 MG tablet TAKE 1 TABLET BY MOUTH EVERY DAY 90 tablet 0  . ALPRAZolam (XANAX) 0.25 MG tablet TAKE 1 TABLET TWICE A DAY AS NEEDED FOR ANXIETY 60 tablet 0  . aspirin EC 81 MG tablet Take 81 mg by mouth daily.    Marland Kitchen atorvastatin (LIPITOR) 40 MG  tablet TAKE 1 TABLET DAILY BY MOUTH. PLEASE KEEP UPCOMING APPT FOR FUTURE REFILLS. THANKS 90 tablet 3  . calcium carbonate (CALCIUM 600) 600 MG TABS tablet Take 600 mg by mouth See admin instructions. Once every 2 weeks    . cetirizine (ZYRTEC) 10 MG tablet Take 10 mg by mouth daily.    . Cholecalciferol (VITAMIN D3 PO) Take 1,000 Units by mouth daily.    . clopidogrel (PLAVIX) 75 MG tablet Take 1 tablet (75 mg total) by mouth daily with breakfast. 90 tablet 2  . Coenzyme Q10 (CO Q 10 PO) Take 300  mg by mouth See admin instructions. Once every 2 weeks.    . Cyanocobalamin (VITAMIN B-12) 5000 MCG SUBL Place 10,000 mcg under the tongue daily.     . diclofenac sodium (VOLTAREN) 1 % GEL Apply 4 g topically 4 (four) times daily. 4 Tube 3  . furosemide (LASIX) 40 MG tablet Take 1 tablet (40 mg total) by mouth daily. 90 tablet 3  . HYDROcodone-acetaminophen (NORCO/VICODIN) 5-325 MG tablet Take 1 tablet by mouth every 6 (six) hours as needed for moderate pain. 30 tablet 0  . losartan (COZAAR) 100 MG tablet TAKE 1 TABLET BY MOUTH EVERY DAY 90 tablet 3  . metoprolol tartrate (LOPRESSOR) 100 MG tablet Take 1 tablet (100 mg total) by mouth as directed. Please take 100 mg in the morning and 50 mg at bedtime 180 tablet 3  . Misc Natural Products (OSTEO BI-FLEX ADV JOINT SHIELD PO) Take by mouth.    . Misc Natural Products (TART CHERRY ADVANCED) CAPS Take 1,000 capsules by mouth daily.     . nitroGLYCERIN (NITROSTAT) 0.4 MG SL tablet Place 1 tablet (0.4 mg total) under the tongue every 5 (five) minutes as needed for chest pain. 25 tablet 3  . NON FORMULARY 83.3 mg daily.    . Omega-3 Fatty Acids (FISH OIL OMEGA-3 PO) Take 1,400 mg by mouth See admin instructions. Once every couple weeks    . pantoprazole (PROTONIX) 20 MG tablet Take 1 tablet (20 mg total) by mouth daily. 90 tablet 1  . potassium chloride (K-DUR) 10 MEQ tablet Take 1 tablet (10 mEq total) by mouth daily. 90 tablet 3  . vitamin E 200 UNIT capsule  Take 200 Units by mouth as needed.     . Ipratropium-Albuterol (COMBIVENT) 20-100 MCG/ACT AERS respimat Inhale 1 puff into the lungs every 8 (eight) hours as needed for wheezing. 4 g 4  . Tiotropium Bromide Monohydrate (SPIRIVA RESPIMAT) 2.5 MCG/ACT AERS Inhale 2 puffs into the lungs daily. 2 Inhaler 0   No facility-administered medications prior to visit.         Objective:   Physical Exam Vitals:   08/02/18 0857  BP: 126/84  Pulse: 60  SpO2: 94%  Weight: 97.9 kg  Height: 5\' 2"  (1.575 m)   Gen: Pleasant, overwt, in no distress,  normal affect  ENT: No lesions,  mouth clear,  oropharynx clear, no postnasal drip  Neck: No JVD, no stridor  Lungs: No use of accessory muscles, slightly distant, no wheeze or crackles.   Cardiovascular: RRR, heart sounds normal, no murmur or gallops, no peripheral edema  Musculoskeletal: No deformities, no cyanosis or clubbing  Neuro: alert, non focal  Skin: Warm, no lesions or rash     Assessment & Plan:  COPD (chronic obstructive pulmonary disease) ([place]) She did get clinical benefit from the Spiriva Respimat but it is not on her formulary, expenses high.  She is agreeable to try an alternative to see if she is the same benefit and the cost benefit ratio is acceptable.  We will try Incruse 1 inhalation once daily since this medication is on her insurance formulary.  Remember to rinse and gargle after you use it.  Call our office when the samples run out to let us know if you have benefited and if you would like for [place] to send a prescription to your pharmacy. We will order albuterol.  You can use 2 puffs up to every 4 hours if you were to needed for shortness of breath, chest tightness,  wheezing. Your flu shot and pneumonia shots are up-to-date. Follow with Dr Lamonte Sakai in 6 months or sooner if you have any problems  Baltazar Apo, MD, PhD 08/02/2018, 9:25 AM [facility] [phone] or if no answer [phone]

## 2018-08-02 NOTE — Patient Instructions (Signed)
We will try Incruse 1 inhalation once daily since this medication is on her insurance formulary.  Remember to rinse and gargle after you use it.  Call our office when the samples run out to let us know if you have benefited and if you would like for [place] to send a prescription to your pharmacy. We will order albuterol.  You can use 2 puffs up to every 4 hours if you were to needed for shortness of breath, chest tightness, wheezing. Your flu shot and pneumonia shots are up-to-date. Follow with Dr Lamonte Sakai in 6 months or sooner if you have any problems

## 2018-08-02 NOTE — Assessment & Plan Note (Signed)
She did get clinical benefit from the Spiriva Respimat but it is not on her formulary, expenses high.  She is agreeable to try an alternative to see if she is the same benefit and the cost benefit ratio is acceptable.  We will try Incruse 1 inhalation once daily since this medication is on her insurance formulary.  Remember to rinse and gargle after you use it.  Call our office when the samples run out to let us know if you have benefited and if you would like for [place] to send a prescription to your pharmacy. We will order albuterol.  You can use 2 puffs up to every 4 hours if you were to needed for shortness of breath, chest tightness, wheezing. Your flu shot and pneumonia shots are up-to-date. Follow with Dr Lamonte Sakai in 6 months or sooner if you have any problems

## 2018-09-06 ENCOUNTER — Encounter: Payer: Self-pay | Admitting: Physician Assistant

## 2018-09-08 ENCOUNTER — Ambulatory Visit (INDEPENDENT_AMBULATORY_CARE_PROVIDER_SITE_OTHER): Payer: Medicare Other

## 2018-09-08 ENCOUNTER — Ambulatory Visit (HOSPITAL_COMMUNITY)
Admission: EM | Admit: 2018-09-08 | Discharge: 2018-09-08 | Disposition: A | Discharge status: Home / Self Care | Payer: Medicare Other | Attending: Family Medicine | Admitting: Family Medicine

## 2018-09-08 ENCOUNTER — Encounter (HOSPITAL_COMMUNITY): Payer: Self-pay | Admitting: Emergency Medicine

## 2018-09-08 DIAGNOSIS — R0602 Shortness of breath: Secondary | ICD-10-CM

## 2018-09-08 MED ORDER — PREDNISONE 10 MG (21) PO TBPK: ORAL_TABLET | Freq: Every day | ORAL | 0 refills | Status: DC

## 2018-09-08 NOTE — ED Provider Notes (Signed)
[place]   [id number] 09/08/18 Arrival Time: 1221  ASSESSMENT & PLAN:  1. SOB (shortness of breath)   Sees Dr Lamonte Sakai, pulmonology. Recent self-discontinuation of most recent inhaler (Incruse) last week. More coughing over the past few days. Slight worsening of her baseline SOB on exertion. No indication for hospital evaluation/admission at this time.  I have personally viewed the imaging studies ordered this visit. No infiltrates seen.  Imaging: Dg Chest 2 View Result Date: 09/08/2018 CLINICAL DATA:  Shortness of breath and wheezing. EXAM: CHEST - 2 VIEW COMPARISON:  IMPRESSION: Mild cardiac enlargement and pulmonary interstitial edema. Electronically Signed   By: Aletta Edouard M.D.   On: 09/08/2018 14:01   Question of early COPD exacerbation. Trial of: Meds ordered this encounter  Medications  . predniSONE (STERAPRED UNI-PAK 21 TAB) 10 MG (21) TBPK tablet    Sig: Take by mouth daily. Take as directed.    Dispense:  21 tablet    Refill:  0   Has albuterol inhaler to use prn.  Plans to call her pulmonologist Monday morning to schedule any needed follow up. She may follow up here if needed. To the ED if any drastic or abrupt changes in her breathing.  SOB precautions given. Reviewed expectations re: course of current medical issues. Questions answered. Outlined signs and symptoms indicating need for more acute intervention. Patient verbalized understanding. After Visit Summary given.  SUBJECTIVE: History from: patient.  Tracy [name] is a 72 y.o. female with a h/o COPD, CAD, HTN, and diastolic CHF who presents with complaint of SOB with exertion a little over her baseline. Resolves quickly with rest. No chest pain or chest tightness. Wheezing at times. Mild nasal congestion. Triggers: none reported. Recent self-discontinuation of most recent inhaler (Incruse) last week. More coughing over the past few days. Describes wheezing as mild when present. Fever:  none reported or suspected. Overall normal PO intake without n/v. Sick contacts: no. Inhaler use: albuterol with some temporary relief OTC treatment: none reported.  Social History   Tobacco Use  Smoking Status Former Smoker  . Packs/day: 0.20  . Years: 50.00  . Pack years: 10.00  . Last attempt to quit: 07/01/2012  . Years since quitting: 6.1  Smokeless Tobacco Never Used  Tobacco Comment   completely quit May of 2018; period of years she did not smoke    ROS: As per HPI. All other systems negative.    OBJECTIVE:  Vitals:   09/08/18 1231  BP: (!) 163/80  Pulse: 87  Resp: (!) 24  Temp: (!) 97.5 F (36.4 C)  TempSrc: Oral  SpO2: 98%    General appearance: alert; appears fatigued; no acute distress HEENT: mild nasal congestion; throat normal Neck: supple without LAD; trachea midline CV: RRR Lungs: tachypnea noted; able to speak in full sentences; no appreciable wheezing while here; cough: mild; overall, no significant respiratory distress (recheck RR 20) Abd: soft; non-tender Skin: warm and dry LE: no significant bilateral LE edema Psychological: alert and cooperative; normal mood and affect   Allergies  Allergen Reactions  . Erythromycin Rash    But isn't certain  . Sulfamethoxazole Rash    Past Medical History:  Diagnosis Date  . Allergic rhinitis   . CAD (coronary artery disease)    1/19 PCI/DES to [place] for ISR, normal EF.   Marland Kitchen CHF (congestive heart failure) ([place])   . Gout   . Headache(784.0)   . Hyperlipidemia   . Hypertension   . Low back pain   .  Menopausal syndrome   . Myocardial infarct ([place]) 2007   hx of  . Overactive bladder    Family History  Adopted: Yes  Problem Relation Age of Onset  . Colon cancer Mother 51  . Other Unknown        patient is adopted  . Stomach cancer Neg Hx    Social History   Socioeconomic History  . Marital status: Married    Spouse name: Not on file  . Number of children: Not on file  . Years of education:  Not on file  . Highest education level: Not on file  Occupational History  . Occupation: direct Civil engineer, contracting  Social Needs  . Financial resource strain: Not on file  . Food insecurity:    Worry: Not on file    Inability: Not on file  . Transportation needs:    Medical: No    Non-medical: No  Tobacco Use  . Smoking status: Former Smoker    Packs/day: 0.20    Years: 50.00    Pack years: 10.00    Last attempt to quit: 07/01/2012    Years since quitting: 6.1  . Smokeless tobacco: Never Used  . Tobacco comment: completely quit May of 2018; period of years she did not smoke   Substance and Sexual Activity  . Alcohol use: No  . Drug use: No  . Sexual activity: Not on file  Lifestyle  . Physical activity:    Days per week: 0 days    Minutes per session: 0 min  . Stress: Only a little  Relationships  . Social connections:    Talks on phone: Not on file    Gets together: Not on file    Attends religious service: Not on file    Active member of club or organization: Not on file    Attends meetings of clubs or organizations: Not on file    Relationship status: Not on file  . Intimate partner violence:    Fear of current or ex partner: Not on file    Emotionally abused: Not on file    Physically abused: Not on file    Forced sexual activity: Not on file  Other Topics Concern  . Not on file  Social History Narrative  . Not on file            Vanessa Kick, MD 09/10/18 1118

## 2018-09-08 NOTE — ED Notes (Signed)
Patient was made aware she may need to go to ed.  Patient said she wanted to see what was said here first

## 2018-09-08 NOTE — ED Triage Notes (Signed)
Patient reports sob with activity.  Has seen pcp about this.  Was started on spiriva-now out of spiriva and pcp started a new medicine incruse 10/17 and albuterol inhaler.  Patient stopped incruse medication this past week becouse it was making her worse.  Patient has not notified pcp.  Patient did not feel medicine was helping her breathing.    Now patient is sob and congested.

## 2018-09-08 NOTE — ED Notes (Signed)
Audible wheezing with coughing episodes  Initially patient refused a  wheelchair.  Explained she has complained of sob with activity and chest pain intermittent with coughing and activity.  Patient then agreed to a wheelchair

## 2018-09-10 ENCOUNTER — Other Ambulatory Visit: Payer: Self-pay | Admitting: Physician Assistant

## 2018-09-12 ENCOUNTER — Other Ambulatory Visit: Payer: Self-pay | Admitting: Internal Medicine

## 2018-09-12 ENCOUNTER — Other Ambulatory Visit: Payer: Self-pay | Admitting: Physician Assistant

## 2018-09-12 NOTE — Telephone Encounter (Signed)
Message routed to PCP CMA for refills. 

## 2018-09-12 NOTE — Telephone Encounter (Signed)
Rx sent to pharmacy as requested.

## 2018-09-18 NOTE — Progress Notes (Signed)
Cardiology Office Note    Date:  09/25/2018   ID:  Malyiah, Fellows [birth year], MRN [id number]  PCP:  [name], [name], MD  Cardiologist: Sherren Mocha, MD EPS: None  Chief Complaint  Patient presents with  . Shortness of Breath  . Leg Swelling    History of Present Illness:  Tracy [name] is a 72 y.o. female with history of CAD status post BMS to the left circumflex 2007, repeat stenting of the circumflex with a DES platform 10/2017 after an abnormal stress test with inferolateral ischemia and chest pain.  Last saw Dr. Burt Knack 01/04/2018 at which time she was having dyspnea on exertion.  She was getting relief by taking extra Lasix so he increased her Lasix to 40 mg daily and added potassium 10 mEq daily.  She had gained 25 pounds over 18 months which he thought could also be contributing to her symptoms and recommended diet and exercise with weight loss.  She weighed 207 pounds that day. chest x-ray showed no acute abnormality.   I saw the patient 03/20/2018 which time she was still complaining of dyspnea on exertion.  She saw pulmonary who felt its likely multifactorial with an COPD on pulmonary function testing.  She was given Spiriva. blood pressure was on the low side so I decreased her metoprolol and changed her the long-acting.  Went to urgent care 09/08/2018 with worsening shortness of breath after stopping inhalers.  Chest x-ray showed mild cardiac enlargement and pulmonary interstitial edema.  She was given steroid Dosepak.  Seen by pulmonary yesterday for acute shortness of breath and wheezing as well as congestion.  BNP was 234 and chest x-ray showed pulmonary interstitial edema.  Patient had not been compliant with her diuretics.  She was also given Xopenex nebulizer treatment in the office and doxycycline and steroid taper.  Eating out all the time and the wrong foods.  Increase leg edema to the point she cut her socks.   Past Medical History:  Diagnosis Date  .  Allergic rhinitis   . CAD (coronary artery disease)    1/19 PCI/DES to [place] for ISR, normal EF.   Marland Kitchen CHF (congestive heart failure) ([place])   . Gout   . Headache(784.0)   . Hyperlipidemia   . Hypertension   . Low back pain   . Menopausal syndrome   . Myocardial infarct ([place]) 2007   hx of  . Overactive bladder     Past Surgical History:  Procedure Laterality Date  . ANGIOPLASTY     stent 2007  . CARDIAC CATHETERIZATION    . COLONOSCOPY    . CORONARY STENT INTERVENTION N/A 11/06/2017   Procedure: CORONARY STENT INTERVENTION;  Surgeon: [name], MD;  Location: [facility];  Service: Cardiovascular;  Laterality: N/A;  . LEFT HEART CATH AND CORONARY ANGIOGRAPHY N/A 11/06/2017   Procedure: LEFT HEART CATH AND CORONARY ANGIOGRAPHY;  Surgeon: [name], MD;  Location: [facility];  Service: Cardiovascular;  Laterality: N/A;  . TONSILLECTOMY      Current Medications: Current Meds  Medication Sig  . albuterol (PROVENTIL HFA;VENTOLIN HFA) 108 (90 Base) MCG/ACT inhaler Inhale 2 puffs into the lungs every 4 (four) hours as needed for wheezing or shortness of breath.  . allopurinol (ZYLOPRIM) 100 MG tablet TAKE 1 TABLET BY MOUTH EVERY DAY  . ALPRAZolam (XANAX) 0.25 MG tablet TAKE 1 TABLET TWICE A DAY AS NEEDED FOR ANXIETY  . aspirin EC 81 MG tablet Take 81  mg by mouth daily.  Marland Kitchen atorvastatin (LIPITOR) 40 MG tablet TAKE 1 TABLET DAILY BY MOUTH. PLEASE KEEP UPCOMING APPT FOR FUTURE REFILLS. THANKS  . calcium carbonate (CALCIUM 600) 600 MG TABS tablet Take 600 mg by mouth See admin instructions. Once every 2 weeks  . cetirizine (ZYRTEC) 10 MG tablet Take 10 mg by mouth daily.  . Cholecalciferol (VITAMIN D3 PO) Take 1,000 Units by mouth daily.  . clopidogrel (PLAVIX) 75 MG tablet TAKE 1 TABLET (75 MG TOTAL) BY MOUTH DAILY WITH BREAKFAST.  Marland Kitchen Coenzyme Q10 (CO Q 10 PO) Take 300 mg by mouth See admin instructions. Once every 2 weeks.  . Cyanocobalamin (VITAMIN B-12) 5000 MCG  SUBL Place 10,000 mcg under the tongue daily.   . diclofenac sodium (VOLTAREN) 1 % GEL Apply 4 g topically 4 (four) times daily.  Marland Kitchen doxycycline (VIBRA-TABS) 100 MG tablet Take 1 tablet (100 mg total) by mouth 2 (two) times daily.  . furosemide (LASIX) 40 MG tablet Take 1 tablet (40 mg total) by mouth daily.  Marland Kitchen HYDROcodone-acetaminophen (NORCO/VICODIN) 5-325 MG tablet Take 1 tablet by mouth every 6 (six) hours as needed for moderate pain.  Marland Kitchen KLOR-CON M20 20 MEQ tablet TAKE 1 TABLET (20 MEQ TOTAL) DAILY BY MOUTH.  Marland Kitchen losartan (COZAAR) 100 MG tablet TAKE 1 TABLET BY MOUTH EVERY DAY  . metoprolol tartrate (LOPRESSOR) 100 MG tablet Take 1 tablet (100 mg total) by mouth as directed. Please take 100 mg in the morning and 50 mg at bedtime  . Misc Natural Products (OSTEO BI-FLEX ADV JOINT SHIELD PO) Take by mouth.  . Misc Natural Products (TART CHERRY ADVANCED) CAPS Take 1,000 capsules by mouth daily.   . nitroGLYCERIN (NITROSTAT) 0.4 MG SL tablet Place 1 tablet (0.4 mg total) under the tongue every 5 (five) minutes as needed for chest pain.  . NON FORMULARY 83.3 mg daily.  . Omega-3 Fatty Acids (FISH OIL OMEGA-3 PO) Take 1,400 mg by mouth See admin instructions. Once every couple weeks  . pantoprazole (PROTONIX) 20 MG tablet Take 1 tablet (20 mg total) by mouth daily.  . potassium chloride (K-DUR) 10 MEQ tablet Take 1 tablet (10 mEq total) by mouth daily.  . predniSONE (DELTASONE) 10 MG tablet Take 2 tabs x 5 days  . umeclidinium-vilanterol (ANORO ELLIPTA) 62.5-25 MCG/INH AEPB Inhale 1 puff into the lungs daily.  . vitamin E 200 UNIT capsule Take 200 Units by mouth as needed.      Allergies:   Erythromycin and Sulfamethoxazole   Social History   Socioeconomic History  . Marital status: Married    Spouse name: Not on file  . Number of children: Not on file  . Years of education: Not on file  . Highest education level: Not on file  Occupational History  . Occupation: direct Industrial/product designer  Social Needs  . Financial resource strain: Not on file  . Food insecurity:    Worry: Not on file    Inability: Not on file  . Transportation needs:    Medical: No    Non-medical: No  Tobacco Use  . Smoking status: Former Smoker    Packs/day: 0.20    Years: 50.00    Pack years: 10.00    Last attempt to quit: 07/01/2012    Years since quitting: 6.2  . Smokeless tobacco: Never Used  . Tobacco comment: completely quit May of 2018; period of years she did not smoke   Substance and Sexual Activity  . Alcohol  use: No  . Drug use: No  . Sexual activity: Not on file  Lifestyle  . Physical activity:    Days per week: 0 days    Minutes per session: 0 min  . Stress: Only a little  Relationships  . Social connections:    Talks on phone: Not on file    Gets together: Not on file    Attends religious service: Not on file    Active member of club or organization: Not on file    Attends meetings of clubs or organizations: Not on file    Relationship status: Not on file  Other Topics Concern  . Not on file  Social History Narrative  . Not on file     Family History:  The patient's family history includes Colon cancer (age of onset: 4) in her mother; Other in her unknown relative. She was adopted.   ROS:   Please see the history of present illness.    Review of Systems  Constitution: Negative.  HENT: Positive for congestion.   Eyes: Negative.   Cardiovascular: Positive for dyspnea on exertion and leg swelling.  Respiratory: Positive for cough, shortness of breath and wheezing.   Hematologic/Lymphatic: Negative.   Musculoskeletal: Negative.  Negative for joint pain.  Gastrointestinal: Negative.   Genitourinary: Negative.   Neurological: Negative.    All other systems reviewed and are negative.   PHYSICAL EXAM:   VS:  BP 134/82   Pulse 87   Ht _0  (1.651 m)   Wt 223 lb 3.2 oz (101.2 kg)   SpO2 94%   BMI 37.14 kg/m   Physical Exam  GEN: Well nourished, well  developed, in no acute distress  Neck: no JVD, carotid bruits, or masses Cardiac:RRR; no murmurs, rubs, or gallops  Respiratory: Decreased breath sounds with diffuse wheezing rales and rhonchi  GI: soft, nontender, nondistended, + BS Ext: +2 edema bilaterally without cyanosis, clubbing, or Good distal pulses bilaterally Neuro:  Alert and Oriented x 3 Psych: euthymic mood, full affect  Wt Readings from Last 3 Encounters:  09/25/18 223 lb 3.2 oz (101.2 kg)  09/24/18 226 lb 3.2 oz (102.6 kg)  08/02/18 215 lb 12.8 oz (97.9 kg)      Studies/Labs Reviewed:   EKG:  EKG is  ordered today.  EKG showed normal sinus rhythm, normal EKG Recent Labs: 09/24/2018: BUN 19; Creatinine, Ser 1.18; Hemoglobin 11.8; Platelets 237.0; Potassium 3.9; Pro B Natriuretic peptide (BNP) 234.0; Sodium 142   Lipid Panel    Component Value Date/Time   CHOL 154 09/13/2016 0738   TRIG 175 (H) 09/13/2016 0738   HDL 31 (L) 09/13/2016 0738   CHOLHDL 5.0 (H) 09/13/2016 0738   VLDL 35 (H) 09/13/2016 0738   LDLCALC 88 09/13/2016 0738   LDLDIRECT 111.0 01/05/2015 1019    Additional studies/ records that were reviewed today include:   11/06/2017  1:59 PM EST    Coronary Findings    Diagnostic  Dominance: Right  Left Main  Vessel was injected. Vessel is large.  Left Anterior Descending  Vessel was injected. Vessel is large. The vessel is moderately tortuous. Overall appearance is not significantly different than prior studies in 2011 and 2012.  Ost LAD lesion 30% stenosed  Ost LAD lesion is 30% stenosed. The lesion is eccentric. The lesion is calcified.  Prox LAD to Mid LAD lesion 50% stenosed  Prox LAD to Mid LAD lesion is 50% stenosed. The lesion is eccentric. The lesion is moderately calcified.  Mid LAD lesion 50% stenosed  Mid LAD lesion is 50% stenosed.  Mid LAD to Dist LAD lesion 40% stenosed  Mid LAD to Dist LAD lesion is 40% stenosed.  First Diagonal Branch  Vessel is small in size.  Second  Diagonal Branch  Vessel is large in size.  Third Diagonal Branch  Vessel is small in size.  Left Circumflex  Vessel was injected. Vessel is large.  Ost Cx lesion 25% stenosed  Ost Cx lesion is 25% stenosed.  Prox Cx lesion 25% stenosed  Prox Cx lesion is 25% stenosed.  Prox Cx to Mid Cx lesion 60% stenosed  Prox Cx to Mid Cx lesion is 60% stenosed. The lesion was previously treated using a bare metal stent over 2 years ago. Previously placed stent displays restenosis. Proximal stent has in-stent restenosis of up to 50-60%.  First Obtuse Marginal Branch  Vessel is small in size.  Second Obtuse Marginal Branch  Vessel is small in size.  Third Obtuse Marginal Branch  Vessel is large in size.  Right Coronary Artery  Vessel was injected. Vessel is large. The vessel is mildly tortuous.  Prox RCA to Mid RCA lesion 25% stenosed  Prox RCA to Mid RCA lesion is 25% stenosed. The lesion is eccentric. The lesion is calcified.  Intervention    Prox Cx lesion  Stent (Also treats lesions: Prox Cx to Mid Cx)  A drug-eluting stent was successfully placed using a STENT SYNERGY DES 3X20. Maximum pressure: 14 atm. Stent strut is well apposed. Stent overlaps previously placed stent.  Post-Intervention Lesion Assessment  There is no residual stenosis post intervention.  Prox Cx to Mid Cx lesion  Stent (Also treats lesions: Prox Cx)  A drug-eluting stent was successfully placed using a STENT SYNERGY DES 3X20. Maximum pressure: 14 atm. Stent strut is well apposed. Stent overlaps previously placed stent.  Post-Intervention Lesion Assessment  There is no residual stenosis post intervention.  Left Heart    Left Ventricle LV end diastolic pressure is mildly elevated. LVEDP 20-22 mmHg.    Echo 09-29-2017: Left ventricle:  The cavity size was normal. Wall thickness was increased in a pattern of mild LVH. Systolic function was normal. The estimated ejection fraction was in the range of 50% to 55%. Wall  motion was normal; there were no regional wall motion abnormalities. Doppler parameters are consistent with abnormal left ventricular relaxation (grade 1 diastolic dysfunction).   ------------------------------------------------------------------- Myoview 10-13-2017: Study Highlights       Nuclear stress EF: 51%.  There was no ST segment deviation noted during stress.  Defect 1: There is a medium defect of moderate severity present in the basal inferior, basal inferolateral, mid inferior and mid inferolateral location.  This is an intermediate risk study.  Findings consistent with ischemia.  The left ventricular ejection fraction is mildly decreased (45-54%).   There is a medium size, moderate severity reversible defect in the basal and mid inferior and inferolateral walls consistent with ischemia (SDS =4).              ASSESSMENT:    1. Chronic combined systolic and diastolic heart failure ([place])   2. Coronary artery disease involving native coronary artery of native heart without angina pectoris   3. Essential hypertension   4. CKD (chronic kidney disease), stage III ([place])   5. Chronic obstructive pulmonary disease, unspecified COPD type ([place])      PLAN:  In order of problems listed above:  Acute on chronic combined systolic and diastolic  CHF improvement in LV function EF 50 to 55% with grade 1 DD on echo 09/2017.  Has had renal insufficiency on increased Lasix in the past.  Recent urgent care visit for increased shortness of breath after stopping inhalers.  Chest x-ray showed interstitial edema.BNP yesterday 234.  Patient was not taking her Lasix daily.  Since she took her Lasix yesterday her edema is down.  2 g sodium diet reiterated.  I have asked her to take her Lasix daily.  I will see her back in 1 month and repeat labs.  CAD status post BMS to the circumflex in 2007 DES to the circumflex 10/25/2017 with residual nonobstructive disease in the LAD and RCA no  angina  Essential hypertension pressure stable  CKD stage III renal function normal yesterday  COPD new diagnosis followed by Dr. Lamonte Sakai with exacerbation on doxycycline and nebulizers    Medication Adjustments/Labs and Tests Ordered: Current medicines are reviewed at length with the patient today.  Concerns regarding medicines are outlined above.  Medication changes, Labs and Tests ordered today are listed in the Patient Instructions below. Patient Instructions   Medication Instructions:  Your physician recommends that you continue on your current medications as directed. Please refer to the Current Medication list given to you today.  If you need a refill on your cardiac medications before your next appointment, please call your pharmacy.   Lab work: Your physician recommends that you return for lab work (BMET, BNP) on 10/30/18   If you have labs (blood work) drawn today and your tests are completely normal, you will receive your results only by: Marland Kitchen MyChart Message (if you have MyChart) OR . A paper copy in the mail If you have any lab test that is abnormal or we need to change your treatment, we will call you to review the results.  Testing/Procedures: None ordered  Follow-Up: . Follow up with Ermalinda Barrios, PA on 10/30/18 at 8:00 AM  Any Other Special Instructions Will Be Listed Below (If Applicable).  Two Gram Sodium Diet 2000 mg  What is Sodium? Sodium is a mineral found naturally in many foods. The most significant source of sodium in the diet is table salt, which is about 40% sodium.  Processed, convenience, and preserved foods also contain a large amount of sodium.  The body needs only 500 mg of sodium daily to function,  A normal diet provides more than enough sodium even if you do not use salt.  Why Limit Sodium? A build up of sodium in the body can cause thirst, increased blood pressure, shortness of breath, and water retention.  Decreasing sodium in the diet can  reduce edema and risk of heart attack or stroke associated with high blood pressure.  Keep in mind that there are many other factors involved in these health problems.  Heredity, obesity, lack of exercise, cigarette smoking, stress and what you eat all play a role.  General Guidelines:  Do not add salt at the table or in cooking.  One teaspoon of salt contains over 2 grams of sodium.  Read food labels  Avoid processed and convenience foods  Ask your dietitian before eating any foods not dicussed in the menu planning guidelines  Consult your physician if you wish to use a salt substitute or a sodium containing medication such as antacids.  Limit milk and milk products to 16 oz (2 cups) per day.  Shopping Hints:  READ LABELS!! "Dietetic" does not necessarily mean low sodium.  Salt and  other sodium ingredients are often added to foods during processing.   Menu Planning Guidelines Food Group Choose More Often Avoid  Beverages (see also the milk group All fruit juices, low-sodium, salt-free vegetables juices, low-sodium carbonated beverages Regular vegetable or tomato juices, commercially softened water used for drinking or cooking  Breads and Cereals Enriched white, wheat, rye and pumpernickel bread, hard rolls and dinner rolls; muffins, cornbread and waffles; most dry cereals, cooked cereal without added salt; unsalted crackers and breadsticks; low sodium or homemade bread crumbs Bread, rolls and crackers with salted tops; quick breads; instant hot cereals; pancakes; commercial bread stuffing; self-rising flower and biscuit mixes; regular bread crumbs or cracker crumbs  Desserts and Sweets Desserts and sweets mad with mild should be within allowance Instant pudding mixes and cake mixes  Fats Butter or margarine; vegetable oils; unsalted salad dressings, regular salad dressings limited to 1 Tbs; light, sour and heavy cream Regular salad dressings containing bacon fat, bacon bits, and salt pork;  snack dips made with instant soup mixes or processed cheese; salted nuts  Fruits Most fresh, frozen and canned fruits Fruits processed with salt or sodium-containing ingredient (some dried fruits are processed with sodium sulfites        Vegetables Fresh, frozen vegetables and low- sodium canned vegetables Regular canned vegetables, sauerkraut, pickled vegetables, and others prepared in brine; frozen vegetables in sauces; vegetables seasoned with ham, bacon or salt pork  Condiments, Sauces, Miscellaneous  Salt substitute with physician's approval; pepper, herbs, spices; vinegar, lemon or lime juice; hot pepper sauce; garlic powder, onion powder, low sodium soy sauce (1 Tbs.); low sodium condiments (ketchup, chili sauce, mustard) in limited amounts (1 tsp.) fresh ground horseradish; unsalted tortilla chips, pretzels, potato chips, popcorn, salsa (1/4 cup) Any seasoning made with salt including garlic salt, celery salt, onion salt, and seasoned salt; sea salt, rock salt, kosher salt; meat tenderizers; monosodium glutamate; mustard, regular soy sauce, barbecue, sauce, chili sauce, teriyaki sauce, steak sauce, Worcestershire sauce, and most flavored vinegars; canned gravy and mixes; regular condiments; salted snack foods, olives, picles, relish, horseradish sauce, catsup   Food preparation: Try these seasonings Meats:    Pork Sage, onion Serve with applesauce  Chicken Poultry seasoning, thyme, parsley Serve with cranberry sauce  Lamb Curry powder, rosemary, garlic, thyme Serve with mint sauce or jelly  Veal Marjoram, basil Serve with current jelly, cranberry sauce  Beef Pepper, bay leaf Serve with dry mustard, unsalted chive butter  Fish Bay leaf, dill Serve with unsalted lemon butter, unsalted parsley butter  Vegetables:    Asparagus Lemon juice   Broccoli Lemon juice   Carrots Mustard dressing parsley, mint, nutmeg, glazed with unsalted butter and sugar   Green beans Marjoram, lemon juice,  nutmeg,dill seed   Tomatoes Basil, marjoram, onion   Spice /blend for [employer]" 4 tsp ground thyme 1 tsp ground sage 3 tsp ground rosemary 4 tsp ground marjoram   Test your knowledge 1. A product that says "Salt Free" may still contain sodium. True or False 2. Garlic Powder and Hot Pepper Sauce an be used as alternative seasonings.True or False 3. Processed foods have more sodium than fresh foods.  True or False 4. Canned Vegetables have less sodium than froze True or False  WAYS TO DECREASE YOUR SODIUM INTAKE 1. Avoid the use of added salt in cooking and at the table.  Table salt (and other prepared seasonings which contain salt) is probably one of the greatest sources of sodium in the diet.  Unsalted foods can gain flavor from the sweet, sour, and butter taste sensations of herbs and spices.  Instead of using salt for seasoning, try the following seasonings with the foods listed.  Remember: how you use them to enhance natural food flavors is limited only by your creativity... Allspice-Meat, fish, eggs, fruit, peas, red and yellow vegetables Almond Extract-Fruit baked goods Anise Seed-Sweet breads, fruit, carrots, beets, cottage cheese, cookies (tastes like licorice) Basil-Meat, fish, eggs, vegetables, rice, vegetables salads, soups, sauces Bay Leaf-Meat, fish, stews, poultry Burnet-Salad, vegetables (cucumber-like flavor) Caraway Seed-Bread, cookies, cottage cheese, meat, vegetables, cheese, rice Cardamon-Baked goods, fruit, soups Celery Powder or seed-Salads, salad dressings, sauces, meatloaf, soup, bread.Do not use  celery salt Chervil-Meats, salads, fish, eggs, vegetables, cottage cheese (parsley-like flavor) Chili Power-Meatloaf, chicken cheese, corn, eggplant, egg dishes Chives-Salads cottage cheese, egg dishes, soups, vegetables, sauces Cilantro-Salsa, casseroles Cinnamon-Baked goods, fruit, pork, lamb, chicken, carrots Cloves-Fruit, baked goods, fish, pot roast, green beans,  beets, carrots Coriander-Pastry, cookies, meat, salads, cheese (lemon-orange flavor) Cumin-Meatloaf, fish,cheese, eggs, cabbage,fruit pie (caraway flavor) [employer], fruit, eggs, fish, poultry, cottage cheese, vegetables Dill Seed-Meat, cottage cheese, poultry, vegetables, fish, salads, bread Fennel Seed-Bread, cookies, apples, pork, eggs, fish, beets, cabbage, cheese, Licorice-like flavor Garlic-(buds or powder) Salads, meat, poultry, fish, bread, butter, vegetables, potatoes.Do not  use garlic salt Ginger-Fruit, vegetables, baked goods, meat, fish, poultry Horseradish Root-Meet, vegetables, butter Lemon Juice or Extract-Vegetables, fruit, tea, baked goods, fish salads Mace-Baked goods fruit, vegetables, fish, poultry (taste like nutmeg) Maple Extract-Syrups Marjoram-Meat, chicken, fish, vegetables, breads, green salads (taste like Sage) Mint-Tea, lamb, sherbet, vegetables, desserts, carrots, cabbage Mustard, Dry or Seed-Cheese, eggs, meats, vegetables, poultry Nutmeg-Baked goods, fruit, chicken, eggs, vegetables, desserts Onion Powder-Meat, fish, poultry, vegetables, cheese, eggs, bread, rice salads (Do not use   Onion salt) Orange Extract-Desserts, baked goods Oregano-Pasta, eggs, cheese, onions, pork, lamb, fish, chicken, vegetables, green salads Paprika-Meat, fish, poultry, eggs, cheese, vegetables Parsley Flakes-Butter, vegetables, meat fish, poultry, eggs, bread, salads (certain forms may   Contain sodium Pepper-Meat fish, poultry, vegetables, eggs Peppermint Extract-Desserts, baked goods Poppy Seed-Eggs, bread, cheese, fruit dressings, baked goods, noodles, vegetables, cottage  [employer], poultry, meat, fish, cauliflower, turnips,eggs bread Saffron-Rice, bread, veal, chicken, fish, eggs Sage-Meat, fish, poultry, onions, eggplant, tomateos, pork, stews Savory-Eggs, salads, poultry, meat, rice, vegetables, soups,  pork Tarragon-Meat, poultry, fish, eggs, butter, vegetables (licorice-like flavor)  Thyme-Meat, poultry, fish, eggs, vegetables, (clover-like flavor), sauces, soups Tumeric-Salads, butter, eggs, fish, rice, vegetables (saffron-like flavor) Vanilla Extract-Baked goods, candy Vinegar-Salads, vegetables, meat marinades Walnut Extract-baked goods, candy  2. Choose your Foods Wisely   The following is a list of foods to avoid which are high in sodium:  Meats-Avoid all smoked, canned, salt cured, dried and kosher meat and fish as well as Anchovies   [employer] meats:Bologna, Liverwurst, Pastrami Canned meat or fish  Marinated herring Caviar    Pepperoni Corned Beef   Pizza Dried chipped beef  Salami Frozen breaded fish or meat Salt pork Frankfurters or hot dogs  Sardines Gefilte fish   Sausage Ham (boiled ham, Proscuitto Smoked butt    spiced ham)   Spam      TV Dinners Vegetables Canned vegetables (Regular) Relish Canned mushrooms  Sauerkraut Olives    Tomato juice Pickles  Bakery and Dessert Products Canned puddings  Cream pies Cheesecake   Decorated cakes Cookies  Beverages/Juices Tomato juice, regular  Gatorade   V-8 vegetable juice, regular  Breads and Cereals Biscuit mixes   Salted  potato chips, corn chips, pretzels Bread stuffing mixes  Salted crackers and rolls Pancake and waffle mixes Self-rising flour  Seasonings Accent    Meat sauces Barbecue sauce  Meat tenderizer Catsup    Monosodium glutamate (MSG) Celery salt   Onion salt Chili sauce   Prepared mustard Garlic salt   Salt, seasoned salt, sea salt Gravy mixes   Soy sauce Horseradish   Steak sauce Ketchup   Tartar sauce Lite salt    Teriyaki sauce Marinade mixes   Worcestershire sauce  Others Baking powder   Cocoa and cocoa mixes Baking soda   Commercial casserole mixes Candy-caramels, chocolate  Dehydrated soups    Bars, fudge,nougats  Instant rice and pasta mixes Canned broth or  soup  Maraschino cherries Cheese, aged and processed cheese and cheese spreads  Learning Assessment Quiz  Indicated T (for True) or F (for False) for each of the following statements:  1. _____ Fresh fruits and vegetables and unprocessed grains are generally low in sodium 2. _____ Water may contain a considerable amount of sodium, depending on the source 3. _____ You can always tell if a food is high in sodium by tasting it 4. _____ Certain laxatives my be high in sodium and should be avoided unless prescribed   by a physician or pharmacist 5. _____ Salt substitutes may be used freely by anyone on a sodium restricted diet 6. _____ Sodium is present in table salt, food additives and as a natural component of   most foods 7. _____ Table salt is approximately 90% sodium 8. _____ Limiting sodium intake may help prevent excess fluid accumulation in the body 9. _____ On a sodium-restricted diet, seasonings such as bouillon soy sauce, and    cooking wine should be used in place of table salt 10. _____ On an ingredient list, a product which lists monosodium glutamate as the first   ingredient is an appropriate food to include on a low sodium diet  Circle the best answer(s) to the following statements (Hint: there may be more than one correct answer)  11. On a low-sodium diet, some acceptable snack items are:    A. Olives  F. Bean dip   K. Grapefruit juice    B. Salted Pretzels G. Commercial Popcorn   L. Canned peaches    C. Carrot Sticks  H. Bouillon   M. Unsalted nuts   D. [place] fries  I. Peanut butter crackers N. Salami   E. Sweet pickles J. Tomato Juice   O. Pizza  12.  Seasonings that may be used freely on a reduced - sodium diet include   A. Lemon wedges F.Monosodium glutamate K. Celery seed    B.Soysauce   G. Pepper   L. Mustard powder   C. Sea salt  H. Cooking wine  M. Onion flakes   D. Vinegar  E. Prepared horseradish N. Salsa   E. Sage   J. Worcestershire sauce  O.  [address]      Sumner Boast, PA-C  09/25/2018 8:49 AM    [facility] [place], [place], [place]  [address] Phone: [phone]; Fax: [phone]

## 2018-09-24 ENCOUNTER — Telehealth: Payer: Self-pay | Admitting: Emergency Medicine

## 2018-09-24 ENCOUNTER — Telehealth: Payer: Self-pay | Admitting: Primary Care

## 2018-09-24 ENCOUNTER — Ambulatory Visit (INDEPENDENT_AMBULATORY_CARE_PROVIDER_SITE_OTHER): Payer: Medicare Other | Admitting: Primary Care

## 2018-09-24 ENCOUNTER — Encounter: Payer: Self-pay | Admitting: Primary Care

## 2018-09-24 VITALS — BP 144/86 | HR 103 | Ht 65.0 in | Wt 226.2 lb

## 2018-09-24 DIAGNOSIS — J069 Acute upper respiratory infection, unspecified: Secondary | ICD-10-CM | POA: Diagnosis not present

## 2018-09-24 DIAGNOSIS — I5032 Chronic diastolic (congestive) heart failure: Secondary | ICD-10-CM

## 2018-09-24 DIAGNOSIS — J441 Chronic obstructive pulmonary disease with (acute) exacerbation: Secondary | ICD-10-CM | POA: Diagnosis not present

## 2018-09-24 DIAGNOSIS — R0602 Shortness of breath: Secondary | ICD-10-CM | POA: Diagnosis not present

## 2018-09-24 LAB — BASIC METABOLIC PANEL
BUN: 19 mg/dL (ref 6–23)
CO2: 26 mEq/L (ref 19–32)
Calcium: 8.8 mg/dL (ref 8.4–10.5)
Chloride: 107 mEq/L (ref 96–112)
Creatinine, Ser: 1.18 mg/dL (ref 0.40–1.20)
GFR: 48.07 mL/min — ABNORMAL LOW (ref >60.00)
Glucose, Bld: 106 mg/dL — ABNORMAL HIGH (ref 70–99)
Potassium: 3.9 mEq/L (ref 3.5–5.1)
Sodium: 142 mEq/L (ref 135–145)

## 2018-09-24 LAB — CBC
HCT: 36.8 % (ref 36.0–46.0)
Hemoglobin: 11.8 g/dL — ABNORMAL LOW (ref 12.0–15.0)
MCHC: 32 g/dL (ref 30.0–36.0)
MCV: 83.4 fl (ref 78.0–100.0)
Platelets: 237 10*3/uL (ref 150.0–400.0)
RBC: 4.42 Mil/uL (ref 3.87–5.11)
RDW: 17 % — ABNORMAL HIGH (ref 11.5–15.5)
WBC: 11.4 10*3/uL — ABNORMAL HIGH (ref 4.0–10.5)

## 2018-09-24 LAB — BRAIN NATRIURETIC PEPTIDE: Pro B Natriuretic peptide (BNP): 234 pg/mL — ABNORMAL HIGH (ref 0.0–100.0)

## 2018-09-24 MED ORDER — LEVALBUTEROL HCL 0.63 MG/3ML IN NEBU
0.6300 mg | INHALATION_SOLUTION | RESPIRATORY_TRACT | Status: AC
  Administered 2018-09-24: 0.63 mg via RESPIRATORY_TRACT

## 2018-09-24 MED ORDER — PREDNISONE 10 MG PO TABS: ORAL_TABLET | ORAL | 0 refills | Status: DC

## 2018-09-24 MED ORDER — DOXYCYCLINE HYCLATE 100 MG PO TABS: 100.0000 mg | ORAL_TABLET | Freq: Two times a day (BID) | ORAL | 0 refills | Status: DC

## 2018-09-24 MED ORDER — UMECLIDINIUM-VILANTEROL 62.5-25 MCG/INH IN AEPB: 1.0000 | INHALATION_SPRAY | Freq: Every day | RESPIRATORY_TRACT | 3 refills | Status: DC

## 2018-09-24 NOTE — Assessment & Plan Note (Addendum)
-  CHF exac likely contributing to dyspnea symptoms -Pro-BNP 234 -CXR showed pulmonary interstitial edema -Patient is not 100% compliant with oral diuretic use  -Advised lasix 40mg  x 4 days without interruption

## 2018-09-24 NOTE — Telephone Encounter (Signed)
Called and spoke to patient. Patient reports that she felt like the Incruse wasn't working and went to ED on 11/23 due to shortness of breath and chest congestion. Patient stated she stopped the inhaler and began prednisone. She stated that she has now started back the Incruse and is feeling worse. Patient reported that she couldn't do a morning appointment because her husband has a doctor's appointment but could do afternoon. Scheduled patient to see Eustaquio Maize, NP at 1530. Nothing further needed at this time.

## 2018-09-24 NOTE — Assessment & Plan Note (Addendum)
-  COPD exacerbation secondary to URI symptoms x 11 days -Xopenex neb treatment in office x1 with improvement -RX doxycycline 1 tab BID x 7 days and prednisone taper -Change Incruse to Anoro 1 puff daily -Albuterol rescue inhaler 2 puffs every 4-6 hours as needed for shortness of breath or wheezing -Advised avoid Menthol cough lozengers -FU in 1-2 months with Dr. Lamonte Sakai, return sooner if not better

## 2018-09-24 NOTE — Telephone Encounter (Signed)
Please call patient and let her know wbc was mildly elevated could be form prednisone or also her URI symptoms. BNP was elevated likely causing some of her dyspnea symptoms. Advised patient take daily lasix as prescribed. Please call and check on patient on Friday. Return if not better. Thanks

## 2018-09-24 NOTE — Patient Instructions (Addendum)
Orders: Labs today   RX: Stop Incruse Start Anoro- take 1 puff daily (sample given, this is your maintenance inhaler) Use albuterol rescue inhaler 2 puffs every 4-6 hours as needed for shortness of breath or wheezing  Doxycycline and prednisone taper as prescribed  Recommendations: Please take Lasix as prescribed for the next 4 days without missing a dose  Follow-up: 1 to 2 months fu with Dr. Lamonte Sakai  Return sooner if not better or symptoms worsen

## 2018-09-24 NOTE — Progress Notes (Signed)
@Patient  ID: Tracy [name], female    DOB: Feb 24, 1946, 72 y.o.   MRN: [id number]  Chief Complaint  Patient presents with  . Acute Visit    States she is more SOB and incruse is not helping. She finished the prednisone 11/30. Audible wheezing.     Referring provider: [name], [name], MD  HPI: 72 year old female, former smoker quit 2013 (10 pack year hx). PMH significant for COPD, allergic rhinitis, GERD, chronic diastolic heart failure. Patient of Dr. Lamonte Sakai, last seen 08/02/18.  Recent ED visit on 11/23 for shortness of breath. Self stopped Incruse. Reported more coughing and slight worsening of baseline SOB. CXR showed mild cardiac enlargement and pulmonary interstitial edema. Given prednisone taper pack.   09/24/2018 Patient presents today for acute visit with complaints of shortness of breath and wheezing. Associated nasal and chest congestion x 11 days. Coughing a little bit of mucus up with slight yellow color. She has not taken anything over the counter except for menthol cough drops. States that she started back on incurse a couple of days ago but doesn't feel its helping. Using albuterol rescue inhaler quite a bit. She is not 100% compliant with oral lasix, states that she probably takes it every other day.  She has some increased bilateral leg swelling   PFTs August 2019- FVC 1.88 (66%), FEV1 1.46 (68%), ratio 78   Allergies  Allergen Reactions  . Erythromycin Rash    But isn't certain  . Sulfamethoxazole Rash    Immunization History  Administered Date(s) Administered  . Influenza Split 07/22/2011, 07/23/2012  . Influenza Whole 08/17/2006, 07/02/2010  . Influenza, High Dose Seasonal PF 09/05/2014, 08/05/2015, 07/11/2016, 07/31/2017, 07/31/2018  . Influenza-Unspecified 07/17/2013  . Pneumococcal Conjugate-13 09/09/2013  . Pneumococcal Polysaccharide-23 08/05/2015  . Tdap 08/05/2011    Past Medical History:  Diagnosis Date  . Allergic rhinitis   . CAD  (coronary artery disease)    1/19 PCI/DES to [facility] for ISR, normal EF.   Marland Kitchen CHF (congestive heart failure) ([facility])   . Gout   . Headache(784.0)   . Hyperlipidemia   . Hypertension   . Low back pain   . Menopausal syndrome   . Myocardial infarct ([facility]) 2007   hx of  . Overactive bladder     Tobacco History: Social History   Tobacco Use  Smoking Status Former Smoker  . Packs/day: 0.20  . Years: 50.00  . Pack years: 10.00  . Last attempt to quit: 07/01/2012  . Years since quitting: 6.2  Smokeless Tobacco Never Used  Tobacco Comment   completely quit May of 2018; period of years she did not smoke    Counseling given: Not Answered Comment: completely quit May of 2018; period of years she did not smoke    Outpatient Medications Prior to Visit  Medication Sig Dispense Refill  . albuterol (PROVENTIL HFA;VENTOLIN HFA) 108 (90 Base) MCG/ACT inhaler Inhale 2 puffs into the lungs every 4 (four) hours as needed for wheezing or shortness of breath. 1 Inhaler 5  . allopurinol (ZYLOPRIM) 100 MG tablet TAKE 1 TABLET BY MOUTH EVERY DAY 90 tablet 0  . ALPRAZolam (XANAX) 0.25 MG tablet TAKE 1 TABLET TWICE A DAY AS NEEDED FOR ANXIETY 60 tablet 0  . aspirin EC 81 MG tablet Take 81 mg by mouth daily.    Marland Kitchen atorvastatin (LIPITOR) 40 MG tablet TAKE 1 TABLET DAILY BY MOUTH. PLEASE KEEP UPCOMING APPT FOR FUTURE REFILLS. THANKS 90 tablet 3  . calcium carbonate (  CALCIUM 600) 600 MG TABS tablet Take 600 mg by mouth See admin instructions. Once every 2 weeks    . cetirizine (ZYRTEC) 10 MG tablet Take 10 mg by mouth daily.    . Cholecalciferol (VITAMIN D3 PO) Take 1,000 Units by mouth daily.    . clopidogrel (PLAVIX) 75 MG tablet TAKE 1 TABLET (75 MG TOTAL) BY MOUTH DAILY WITH BREAKFAST. 90 tablet 1  . Coenzyme Q10 (CO Q 10 PO) Take 300 mg by mouth See admin instructions. Once every 2 weeks.    . Cyanocobalamin (VITAMIN B-12) 5000 MCG SUBL Place 10,000 mcg under the tongue daily.     . diclofenac sodium  (VOLTAREN) 1 % GEL Apply 4 g topically 4 (four) times daily. 4 Tube 3  . furosemide (LASIX) 40 MG tablet Take 1 tablet (40 mg total) by mouth daily. 90 tablet 3  . HYDROcodone-acetaminophen (NORCO/VICODIN) 5-325 MG tablet Take 1 tablet by mouth every 6 (six) hours as needed for moderate pain. 30 tablet 0  . KLOR-CON M20 20 MEQ tablet TAKE 1 TABLET (20 MEQ TOTAL) DAILY BY MOUTH. 90 tablet 3  . losartan (COZAAR) 100 MG tablet TAKE 1 TABLET BY MOUTH EVERY DAY 90 tablet 3  . metoprolol tartrate (LOPRESSOR) 100 MG tablet Take 1 tablet (100 mg total) by mouth as directed. Please take 100 mg in the morning and 50 mg at bedtime 180 tablet 3  . Misc Natural Products (OSTEO BI-FLEX ADV JOINT SHIELD PO) Take by mouth.    . Misc Natural Products (TART CHERRY ADVANCED) CAPS Take 1,000 capsules by mouth daily.     . nitroGLYCERIN (NITROSTAT) 0.4 MG SL tablet Place 1 tablet (0.4 mg total) under the tongue every 5 (five) minutes as needed for chest pain. 25 tablet 3  . NON FORMULARY 83.3 mg daily.    . Omega-3 Fatty Acids (FISH OIL OMEGA-3 PO) Take 1,400 mg by mouth See admin instructions. Once every couple weeks    . pantoprazole (PROTONIX) 20 MG tablet Take 1 tablet (20 mg total) by mouth daily. 90 tablet 1  . potassium chloride (K-DUR) 10 MEQ tablet Take 1 tablet (10 mEq total) by mouth daily. 90 tablet 3  . vitamin E 200 UNIT capsule Take 200 Units by mouth as needed.     . predniSONE (STERAPRED UNI-PAK 21 TAB) 10 MG (21) TBPK tablet Take by mouth daily. Take as directed. 21 tablet 0  . umeclidinium bromide (INCRUSE ELLIPTA) 62.5 MCG/INH AEPB Inhale 1 puff into the lungs daily. 30 each 5   No facility-administered medications prior to visit.     Review of Systems  Review of Systems  Constitutional: Negative.   HENT: Positive for congestion, postnasal drip and sinus pressure.   Respiratory: Positive for cough, shortness of breath and wheezing.   Cardiovascular: Positive for leg swelling.    Physical  Exam  BP (!) 144/86 (BP Location: Left Arm, Cuff Size: Large)   Pulse (!) 103   Ht 5\' 5"  (1.651 m)   Wt 226 lb 3.2 oz (102.6 kg)   SpO2 96%   BMI 37.64 kg/m  Physical Exam  Constitutional: She is oriented to person, place, and time. She appears well-developed and well-nourished.  HENT:  Head: Normocephalic and atraumatic.  Eyes: Pupils are equal, round, and reactive to light.  Neck: Normal range of motion. Neck supple.  Cardiovascular: Regular rhythm.  Regular rhythm, HR 100-105 +1-2 BLE edema   Pulmonary/Chest: Effort normal. No stridor. No respiratory distress.  CTA, upper  airway wheeze/cough   Musculoskeletal: Normal range of motion.  Neurological: She is alert and oriented to person, place, and time.  Skin: Skin is warm and dry.  Psychiatric: She has a normal mood and affect. Her behavior is normal. Thought content normal.     Lab Results:  CBC    Component Value Date/Time   WBC 11.4 (H) 09/24/2018 1627   RBC 4.42 09/24/2018 1627   HGB 11.8 (L) 09/24/2018 1627   HGB 11.8 11/03/2017 0745   HCT 36.8 09/24/2018 1627   HCT 36.0 11/03/2017 0745   PLT 237.0 09/24/2018 1627   PLT 213 11/03/2017 0745   MCV 83.4 09/24/2018 1627   MCV 88 11/03/2017 0745   MCH 28.9 11/03/2017 0745   MCHC 32.0 09/24/2018 1627   RDW 17.0 (H) 09/24/2018 1627   RDW 15.1 11/03/2017 0745   LYMPHSABS 4.0 (H) 11/03/2017 0745   MONOABS 1.2 (H) 07/31/2017 1707   EOSABS 0.4 11/03/2017 0745   BASOSABS 0.1 11/03/2017 0745    BMET    Component Value Date/Time   NA 142 09/24/2018 1627   NA 143 03/20/2018 0837   K 3.9 09/24/2018 1627   CL 107 09/24/2018 1627   CO2 26 09/24/2018 1627   GLUCOSE 106 (H) 09/24/2018 1627   BUN 19 09/24/2018 1627   BUN 27 03/20/2018 0837   CREATININE 1.18 09/24/2018 1627   CREATININE 1.05 (H) 09/13/2016 0738   CALCIUM 8.8 09/24/2018 1627   GFRNONAA 48 (L) 03/20/2018 0837   GFRAA 55 (L) 03/20/2018 0837    BNP No results found for: BNP  ProBNP    Component  Value Date/Time   PROBNP 234.0 (H) 09/24/2018 1627    Imaging: Dg Chest 2 View  Result Date: 09/08/2018 CLINICAL DATA:  Shortness of breath and wheezing. EXAM: CHEST - 2 VIEW COMPARISON:  01/04/2018 FINDINGS: Mild cardiac enlargement. Interstitial prominence above baseline likely reflects a component of mild interstitial edema. No focal airspace consolidation, pneumothorax, nodule or pleural fluid identified. The bony thorax demonstrates stable mild degenerative disease of the thoracic spine. IMPRESSION: Mild cardiac enlargement and pulmonary interstitial edema. Electronically Signed   By: Aletta Edouard M.D.   On: 09/08/2018 14:01     Assessment & Plan:   Chronic diastolic CHF (congestive heart failure) ([facility]) -CHF exac likely contributing to dyspnea symptoms -Pro-BNP 234 -CXR showed pulmonary interstitial edema -Patient is not 100% compliant with oral diuretic use  -Advised lasix 40mg  x 4 days without interruption   COPD (chronic obstructive pulmonary disease) ([facility]) -COPD exacerbation secondary to URI symptoms x 11 days -Xopenex neb treatment in office x1 with improvement -RX doxycycline 1 tab BID x 7 days and prednisone taper -Change Incruse to Anoro 1 puff daily -Albuterol rescue inhaler 2 puffs every 4-6 hours as needed for shortness of breath or wheezing -Advised avoid Menthol cough lozengers -FU in 1-2 months with Dr. Lamonte Sakai, return sooner if not better    Martyn Ehrich, NP 09/24/2018

## 2018-09-25 ENCOUNTER — Ambulatory Visit (INDEPENDENT_AMBULATORY_CARE_PROVIDER_SITE_OTHER): Payer: Medicare Other | Admitting: Physician Assistant

## 2018-09-25 ENCOUNTER — Encounter: Payer: Self-pay | Admitting: Physician Assistant

## 2018-09-25 VITALS — BP 134/82 | HR 87 | Ht 65.0 in | Wt 223.2 lb

## 2018-09-25 DIAGNOSIS — I2510 Atherosclerotic heart disease of native coronary artery without angina pectoris: Secondary | ICD-10-CM | POA: Diagnosis not present

## 2018-09-25 DIAGNOSIS — I5042 Chronic combined systolic (congestive) and diastolic (congestive) heart failure: Secondary | ICD-10-CM | POA: Diagnosis not present

## 2018-09-25 DIAGNOSIS — J449 Chronic obstructive pulmonary disease, unspecified: Secondary | ICD-10-CM | POA: Diagnosis not present

## 2018-09-25 DIAGNOSIS — I10 Essential (primary) hypertension: Secondary | ICD-10-CM

## 2018-09-25 DIAGNOSIS — N183 Chronic kidney disease, stage 3 unspecified: Secondary | ICD-10-CM

## 2018-09-25 NOTE — Patient Instructions (Signed)
Medication Instructions:  Your physician recommends that you continue on your current medications as directed. Please refer to the Current Medication list given to you today.  If you need a refill on your cardiac medications before your next appointment, please call your pharmacy.   Lab work: Your physician recommends that you return for lab work (BMET, BNP) on 10/30/18   If you have labs (blood work) drawn today and your tests are completely normal, you will receive your results only by: Marland Kitchen MyChart Message (if you have MyChart) OR . A paper copy in the mail If you have any lab test that is abnormal or we need to change your treatment, we will call you to review the results.  Testing/Procedures: None ordered  Follow-Up: . Follow up with Ermalinda Barrios, PA on 10/30/18 at 8:00 AM  Any Other Special Instructions Will Be Listed Below (If Applicable).  Two Gram Sodium Diet 2000 mg  What is Sodium? Sodium is a mineral found naturally in many foods. The most significant source of sodium in the diet is table salt, which is about 40% sodium.  Processed, convenience, and preserved foods also contain a large amount of sodium.  The body needs only 500 mg of sodium daily to function,  A normal diet provides more than enough sodium even if you do not use salt.  Why Limit Sodium? A build up of sodium in the body can cause thirst, increased blood pressure, shortness of breath, and water retention.  Decreasing sodium in the diet can reduce edema and risk of heart attack or stroke associated with high blood pressure.  Keep in mind that there are many other factors involved in these health problems.  Heredity, obesity, lack of exercise, cigarette smoking, stress and what you eat all play a role.  General Guidelines:  Do not add salt at the table or in cooking.  One teaspoon of salt contains over 2 grams of sodium.  Read food labels  Avoid processed and convenience foods  Ask your dietitian before eating  any foods not dicussed in the menu planning guidelines  Consult your physician if you wish to use a salt substitute or a sodium containing medication such as antacids.  Limit milk and milk products to 16 oz (2 cups) per day.  Shopping Hints:  READ LABELS!! "Dietetic" does not necessarily mean low sodium.  Salt and other sodium ingredients are often added to foods during processing.   Menu Planning Guidelines Food Group Choose More Often Avoid  Beverages (see also the milk group All fruit juices, low-sodium, salt-free vegetables juices, low-sodium carbonated beverages Regular vegetable or tomato juices, commercially softened water used for drinking or cooking  Breads and Cereals Enriched white, wheat, rye and pumpernickel bread, hard rolls and dinner rolls; muffins, cornbread and waffles; most dry cereals, cooked cereal without added salt; unsalted crackers and breadsticks; low sodium or homemade bread crumbs Bread, rolls and crackers with salted tops; quick breads; instant hot cereals; pancakes; commercial bread stuffing; self-rising flower and biscuit mixes; regular bread crumbs or cracker crumbs  Desserts and Sweets Desserts and sweets mad with mild should be within allowance Instant pudding mixes and cake mixes  Fats Butter or margarine; vegetable oils; unsalted salad dressings, regular salad dressings limited to 1 Tbs; light, sour and heavy cream Regular salad dressings containing bacon fat, bacon bits, and salt pork; snack dips made with instant soup mixes or processed cheese; salted nuts  Fruits Most fresh, frozen and canned fruits Fruits processed with salt  or sodium-containing ingredient (some dried fruits are processed with sodium sulfites        Vegetables Fresh, frozen vegetables and low- sodium canned vegetables Regular canned vegetables, sauerkraut, pickled vegetables, and others prepared in brine; frozen vegetables in sauces; vegetables seasoned with ham, bacon or salt pork   Condiments, Sauces, Miscellaneous  Salt substitute with physician's approval; pepper, herbs, spices; vinegar, lemon or lime juice; hot pepper sauce; garlic powder, onion powder, low sodium soy sauce (1 Tbs.); low sodium condiments (ketchup, chili sauce, mustard) in limited amounts (1 tsp.) fresh ground horseradish; unsalted tortilla chips, pretzels, potato chips, popcorn, salsa (1/4 cup) Any seasoning made with salt including garlic salt, celery salt, onion salt, and seasoned salt; sea salt, rock salt, kosher salt; meat tenderizers; monosodium glutamate; mustard, regular soy sauce, barbecue, sauce, chili sauce, teriyaki sauce, steak sauce, Worcestershire sauce, and most flavored vinegars; canned gravy and mixes; regular condiments; salted snack foods, olives, picles, relish, horseradish sauce, catsup   Food preparation: Try these seasonings Meats:    Pork Sage, onion Serve with applesauce  Chicken Poultry seasoning, thyme, parsley Serve with cranberry sauce  Lamb Curry powder, rosemary, garlic, thyme Serve with mint sauce or jelly  Veal Marjoram, basil Serve with current jelly, cranberry sauce  Beef Pepper, bay leaf Serve with dry mustard, unsalted chive butter  Fish [place] leaf, dill Serve with unsalted lemon butter, unsalted parsley butter  Vegetables:    Asparagus Lemon juice   Broccoli Lemon juice   Carrots Mustard dressing parsley, mint, nutmeg, glazed with unsalted butter and sugar   Green beans Marjoram, lemon juice, nutmeg,dill seed   Tomatoes Basil, marjoram, onion   Spice /blend for [employer]" 4 tsp ground thyme 1 tsp ground sage 3 tsp ground rosemary 4 tsp ground marjoram   Test your knowledge 1. A product that says "Salt Free" may still contain sodium. True or False 2. Garlic Powder and Hot Pepper Sauce an be used as alternative seasonings.True or False 3. Processed foods have more sodium than fresh foods.  True or False 4. Canned Vegetables have less sodium than froze True or  False  WAYS TO DECREASE YOUR SODIUM INTAKE 1. Avoid the use of added salt in cooking and at the table.  Table salt (and other prepared seasonings which contain salt) is probably one of the greatest sources of sodium in the diet.  Unsalted foods can gain flavor from the sweet, sour, and butter taste sensations of herbs and spices.  Instead of using salt for seasoning, try the following seasonings with the foods listed.  Remember: how you use them to enhance natural food flavors is limited only by your creativity... Allspice-Meat, fish, eggs, fruit, peas, red and yellow vegetables Almond Extract-Fruit baked goods Anise Seed-Sweet breads, fruit, carrots, beets, cottage cheese, cookies (tastes like licorice) Basil-Meat, fish, eggs, vegetables, rice, vegetables salads, soups, sauces Bay Leaf-Meat, fish, stews, poultry Burnet-Salad, vegetables (cucumber-like flavor) Caraway Seed-Bread, cookies, cottage cheese, meat, vegetables, cheese, rice Cardamon-Baked goods, fruit, soups Celery Powder or seed-Salads, salad dressings, sauces, meatloaf, soup, bread.Do not use  celery salt Chervil-Meats, salads, fish, eggs, vegetables, cottage cheese (parsley-like flavor) Chili Power-Meatloaf, chicken cheese, corn, eggplant, egg dishes Chives-Salads cottage cheese, egg dishes, soups, vegetables, sauces Cilantro-Salsa, casseroles Cinnamon-Baked goods, fruit, pork, lamb, chicken, carrots Cloves-Fruit, baked goods, fish, pot roast, green beans, beets, carrots Coriander-Pastry, cookies, meat, salads, cheese (lemon-orange flavor) Cumin-Meatloaf, fish,cheese, eggs, cabbage,fruit pie (caraway flavor) [employer], fruit, eggs, fish, poultry, cottage cheese, vegetables Dill Seed-Meat, cottage cheese, poultry, vegetables,  fish, salads, bread Fennel Seed-Bread, cookies, apples, pork, eggs, fish, beets, cabbage, cheese, Licorice-like flavor Garlic-(buds or powder) Salads, meat, poultry, fish, bread, butter, vegetables,  potatoes.Do not  use garlic salt Ginger-Fruit, vegetables, baked goods, meat, fish, poultry Horseradish Root-Meet, vegetables, butter Lemon Juice or Extract-Vegetables, fruit, tea, baked goods, fish salads Mace-Baked goods fruit, vegetables, fish, poultry (taste like nutmeg) Maple Extract-Syrups Marjoram-Meat, chicken, fish, vegetables, breads, green salads (taste like Sage) Mint-Tea, lamb, sherbet, vegetables, desserts, carrots, cabbage Mustard, Dry or Seed-Cheese, eggs, meats, vegetables, poultry Nutmeg-Baked goods, fruit, chicken, eggs, vegetables, desserts Onion Powder-Meat, fish, poultry, vegetables, cheese, eggs, bread, rice salads (Do not use   Onion salt) Orange Extract-Desserts, baked goods Oregano-Pasta, eggs, cheese, onions, pork, lamb, fish, chicken, vegetables, green salads Paprika-Meat, fish, poultry, eggs, cheese, vegetables Parsley Flakes-Butter, vegetables, meat fish, poultry, eggs, bread, salads (certain forms may   Contain sodium Pepper-Meat fish, poultry, vegetables, eggs Peppermint Extract-Desserts, baked goods Poppy Seed-Eggs, bread, cheese, fruit dressings, baked goods, noodles, vegetables, cottage  [employer], poultry, meat, fish, cauliflower, turnips,eggs bread Saffron-Rice, bread, veal, chicken, fish, eggs Sage-Meat, fish, poultry, onions, eggplant, tomateos, pork, stews Savory-Eggs, salads, poultry, meat, rice, vegetables, soups, pork Tarragon-Meat, poultry, fish, eggs, butter, vegetables (licorice-like flavor)  Thyme-Meat, poultry, fish, eggs, vegetables, (clover-like flavor), sauces, soups Tumeric-Salads, butter, eggs, fish, rice, vegetables (saffron-like flavor) Vanilla Extract-Baked goods, candy Vinegar-Salads, vegetables, meat marinades Walnut Extract-baked goods, candy  2. Choose your Foods Wisely   The following is a list of foods to avoid which are high in sodium:  Meats-Avoid all smoked, canned, salt  cured, dried and kosher meat and fish as well as Anchovies   [employer] meats:Bologna, Liverwurst, Pastrami Canned meat or fish  Marinated herring Caviar    Pepperoni Corned Beef   Pizza Dried chipped beef  Salami Frozen breaded fish or meat Salt pork Frankfurters or hot dogs  Sardines Gefilte fish   Sausage Ham (boiled ham, Proscuitto Smoked butt    spiced ham)   Spam      TV Dinners Vegetables Canned vegetables (Regular) Relish Canned mushrooms  Sauerkraut Olives    Tomato juice Pickles  Bakery and Dessert Products Canned puddings  Cream pies Cheesecake   Decorated cakes Cookies  Beverages/Juices Tomato juice, regular  Gatorade   V-8 vegetable juice, regular  Breads and Cereals Biscuit mixes   Salted potato chips, corn chips, pretzels Bread stuffing mixes  Salted crackers and rolls Pancake and waffle mixes Self-rising flour  Seasonings Accent    Meat sauces Barbecue sauce  Meat tenderizer Catsup    Monosodium glutamate (MSG) Celery salt   Onion salt Chili sauce   Prepared mustard Garlic salt   Salt, seasoned salt, sea salt Gravy mixes   Soy sauce Horseradish   Steak sauce Ketchup   Tartar sauce Lite salt    Teriyaki sauce Marinade mixes   Worcestershire sauce  Others Baking powder   Cocoa and cocoa mixes Baking soda   Commercial casserole mixes Candy-caramels, chocolate  Dehydrated soups    Bars, fudge,nougats  Instant rice and pasta mixes Canned broth or soup  Maraschino cherries Cheese, aged and processed cheese and cheese spreads  Learning Assessment Quiz  Indicated T (for True) or F (for False) for each of the following statements:  1. _____ Fresh fruits and vegetables and unprocessed grains are generally low in sodium 2. _____ Water may contain a considerable amount of sodium, depending on the source 3. _____ You can always tell if a  food is high in sodium by tasting it 4. _____ Certain laxatives my be high in sodium and should be avoided  unless prescribed   by a physician or pharmacist 5. _____ Salt substitutes may be used freely by anyone on a sodium restricted diet 6. _____ Sodium is present in table salt, food additives and as a natural component of   most foods 7. _____ Table salt is approximately 90% sodium 8. _____ Limiting sodium intake may help prevent excess fluid accumulation in the body 9. _____ On a sodium-restricted diet, seasonings such as bouillon soy sauce, and    cooking wine should be used in place of table salt 10. _____ On an ingredient list, a product which lists monosodium glutamate as the first   ingredient is an appropriate food to include on a low sodium diet  Circle the best answer(s) to the following statements (Hint: there may be more than one correct answer)  11. On a low-sodium diet, some acceptable snack items are:    A. Olives  F. Bean dip   K. Grapefruit juice    B. Salted Pretzels G. Commercial Popcorn   L. Canned peaches    C. Carrot Sticks  H. Bouillon   M. Unsalted nuts   D. [place] fries  I. Peanut butter crackers N. Salami   E. Sweet pickles J. Tomato Juice   O. Pizza  12.  Seasonings that may be used freely on a reduced - sodium diet include   A. Lemon wedges F.Monosodium glutamate K. Celery seed    B.Soysauce   G. Pepper   L. Mustard powder   C. Sea salt  H. Cooking wine  M. Onion flakes   D. Vinegar  E. Prepared horseradish N. Salsa   E. Sage   J. Worcestershire sauce  O. Chutney

## 2018-09-26 NOTE — Telephone Encounter (Signed)
LMOM to return call.

## 2018-09-26 NOTE — Telephone Encounter (Signed)
Advise delsym cough syrup. If mucus has some blood tinge to it likely ok. If having only bright red blood more than a teaspoon let us know. If not better in 1 week return.

## 2018-09-26 NOTE — Telephone Encounter (Signed)
Patient returned phone call. °

## 2018-09-26 NOTE — Telephone Encounter (Signed)
Called and spoke with pt letting her know the recs stated by [facility]. Pt expressed understanding.nothing further needed.

## 2018-09-26 NOTE — Telephone Encounter (Signed)
Attempted to call pt. Line was connected but then immediately was disconnected.  Called pt back again and at first line went to answering machine but then pt did pick up. I called on her home number but pt stated to call her back on the mobile number listed on demographics.  I stated to pt the results of labwork stated by [facility] and stated to her that if she was not any better by Friday to schedule an appt.  Pt stated to me that she is beginning to feel some better but states now when she is clearing her throat, when she gets some mucus up, there is some blood in it.  Beth, please advise on this for pt in what you recommend? Thanks!

## 2018-10-22 ENCOUNTER — Telehealth: Payer: Self-pay | Admitting: Emergency Medicine

## 2018-10-22 MED ORDER — UMECLIDINIUM-VILANTEROL 62.5-25 MCG/INH IN AEPB: 1.0000 | INHALATION_SPRAY | Freq: Every day | RESPIRATORY_TRACT | 3 refills | Status: DC

## 2018-10-22 NOTE — Telephone Encounter (Signed)
Called and spoke with Patient. She was seen by Geraldo Pitter, NP, 09/24/18, and was given a Anoro inhaler sample.  Patient stated that the Anoro seemed to work very well, and she is requesting a prescription to be sent to [facility].    Geraldo Pitter, NP, please advise if its ok to send a new prescription for Anoro

## 2018-10-22 NOTE — Telephone Encounter (Signed)
YES, glad to hear it. Thank you

## 2018-10-22 NOTE — Telephone Encounter (Signed)
Spoke with pt and advised rx form Anoro sent to pharmacy [facility] on [address]. Nothing further is needed.

## 2018-10-24 NOTE — Progress Notes (Signed)
Cardiology Office Note    Date:  10/30/2018   ID:  Tracy, Oh [name] 12, 1947, MRN [id number]  PCP:  [name], [name], MD  Cardiologist: Sherren Mocha, MD EPS: None  Chief Complaint  Patient presents with  . Follow-up    History of Present Illness:  Tracy [name] is a 73 y.o. female  with history of CAD status post BMS to the left circumflex 2007, repeat stenting of the circumflex with a DES platform 10/2017 after an abnormal stress test with inferolateral ischemia and chest pain.  Last saw Dr. Burt Knack 01/04/2018 at which time she was having dyspnea on exertion.  She was getting relief by taking extra Lasix so he increased her Lasix to 40 mg daily and added potassium 10 mEq daily.  She had gained 25 pounds over 18 months which he thought could also be contributing to her symptoms and recommended diet and exercise with weight loss.  She weighed 207 pounds that day. chest x-ray showed no acute abnormality.    Pulmonary felt her dyspnea on exertion was multifactorial with COPD on PFTs she was given Spiriva.  I decreased her metoprolol and changed her to long-acting because of low blood pressures.  I saw her 09/25/2018 with acute on chronic systolic and diastolic CHF.  Had a recent urgent care visit chest x-ray showed interstitial edema and BNP was 234.  Patient was not taking her Lasix daily and was eating a lot of salty foods.  I resumed her Lasix daily and reiterated 2 g sodium diet.  Patient comes in today accompanied by her husband.  She had 2 bouts of upper respiratory infection over the holidays and still has a residual cough.  She saw Dr. Lamonte Sakai yesterday who changed her inhaler but did not feel she needed any antibiotics.  She is taking her Lasix daily and the swelling is down.  Her weight is only down 1 pound.  She is avoiding salty foods.  Past Medical History:  Diagnosis Date  . Allergic rhinitis   . CAD (coronary artery disease)    1/19 PCI/DES to [place] for ISR,  normal EF.   Marland Kitchen CHF (congestive heart failure) ([place])   . Gout   . Headache(784.0)   . Hyperlipidemia   . Hypertension   . Low back pain   . Menopausal syndrome   . Myocardial infarct ([place]) 2007   hx of  . Overactive bladder     Past Surgical History:  Procedure Laterality Date  . ANGIOPLASTY     stent 2007  . CARDIAC CATHETERIZATION    . COLONOSCOPY    . CORONARY STENT INTERVENTION N/A 11/06/2017   Procedure: CORONARY STENT INTERVENTION;  Surgeon: [name], MD;  Location: [facility];  Service: Cardiovascular;  Laterality: N/A;  . LEFT HEART CATH AND CORONARY ANGIOGRAPHY N/A 11/06/2017   Procedure: LEFT HEART CATH AND CORONARY ANGIOGRAPHY;  Surgeon: [name], MD;  Location: [facility];  Service: Cardiovascular;  Laterality: N/A;  . TONSILLECTOMY      Current Medications: Current Meds  Medication Sig  . albuterol (PROVENTIL HFA;VENTOLIN HFA) 108 (90 Base) MCG/ACT inhaler Inhale 2 puffs into the lungs every 4 (four) hours as needed for wheezing or shortness of breath.  . allopurinol (ZYLOPRIM) 100 MG tablet TAKE 1 TABLET BY MOUTH EVERY DAY  . ALPRAZolam (XANAX) 0.25 MG tablet TAKE 1 TABLET TWICE A DAY AS NEEDED FOR ANXIETY  . aspirin EC 81 MG tablet Take 81 mg by mouth  daily.  . atorvastatin (LIPITOR) 40 MG tablet TAKE 1 TABLET DAILY BY MOUTH. PLEASE KEEP UPCOMING APPT FOR FUTURE REFILLS. THANKS  . calcium carbonate (CALCIUM 600) 600 MG TABS tablet Take 600 mg by mouth See admin instructions. Once every 2 weeks  . cetirizine (ZYRTEC) 10 MG tablet Take 10 mg by mouth daily.  . Cholecalciferol (VITAMIN D3 PO) Take 1,000 Units by mouth daily.  . clopidogrel (PLAVIX) 75 MG tablet TAKE 1 TABLET (75 MG TOTAL) BY MOUTH DAILY WITH BREAKFAST.  Marland Kitchen Coenzyme Q10 (CO Q 10 PO) Take 300 mg by mouth See admin instructions. Once every 2 weeks.  . Cyanocobalamin (VITAMIN B-12) 5000 MCG SUBL Place 10,000 mcg under the tongue daily.   . diclofenac sodium (VOLTAREN) 1 %  GEL Apply 4 g topically 4 (four) times daily.  . furosemide (LASIX) 40 MG tablet Take 1 tablet (40 mg total) by mouth daily.  . Garlic 6789 MG CAPS Take by mouth daily.  Marland Kitchen HYDROcodone-acetaminophen (NORCO/VICODIN) 5-325 MG tablet Take 1 tablet by mouth daily as needed for moderate pain.  Marland Kitchen KLOR-CON M20 20 MEQ tablet TAKE 1 TABLET (20 MEQ TOTAL) DAILY BY MOUTH.  Marland Kitchen losartan (COZAAR) 100 MG tablet TAKE 1 TABLET BY MOUTH EVERY DAY  . metoprolol tartrate (LOPRESSOR) 100 MG tablet Take 1 tablet (100 mg total) by mouth as directed. Please take 100 mg in the morning and 50 mg at bedtime  . Misc Natural Products (OSTEO BI-FLEX ADV JOINT SHIELD PO) Take by mouth.  . Misc Natural Products (TART CHERRY ADVANCED) CAPS Take 1,000 capsules by mouth daily.   . nitroGLYCERIN (NITROSTAT) 0.4 MG SL tablet Place 1 tablet (0.4 mg total) under the tongue every 5 (five) minutes as needed for chest pain.  . NON FORMULARY 83.3 mg daily.  . pantoprazole (PROTONIX) 20 MG tablet Take 1 tablet (20 mg total) by mouth daily.  Marland Kitchen umeclidinium-vilanterol (ANORO ELLIPTA) 62.5-25 MCG/INH AEPB Inhale 1 puff into the lungs daily.  . vitamin E 200 UNIT capsule Take 200 Units by mouth as needed.      Allergies:   Erythromycin and Sulfamethoxazole   Social History   Socioeconomic History  . Marital status: Married    Spouse name: Not on file  . Number of children: Not on file  . Years of education: Not on file  . Highest education level: Not on file  Occupational History  . Occupation: direct Civil engineer, contracting  Social Needs  . Financial resource strain: Not on file  . Food insecurity:    Worry: Not on file    Inability: Not on file  . Transportation needs:    Medical: No    Non-medical: No  Tobacco Use  . Smoking status: Former Smoker    Packs/day: 0.20    Years: 50.00    Pack years: 10.00    Last attempt to quit: 07/01/2012    Years since quitting: 6.3  . Smokeless tobacco: Never Used  . Tobacco comment:  completely quit May of 2018; period of years she did not smoke   Substance and Sexual Activity  . Alcohol use: No  . Drug use: No  . Sexual activity: Not on file  Lifestyle  . Physical activity:    Days per week: 0 days    Minutes per session: 0 min  . Stress: Only a little  Relationships  . Social connections:    Talks on phone: Not on file    Gets together: Not on file  Attends religious service: Not on file    Active member of club or organization: Not on file    Attends meetings of clubs or organizations: Not on file    Relationship status: Not on file  Other Topics Concern  . Not on file  Social History Narrative  . Not on file     Family History:  The patient's family history includes Colon cancer (age of onset: 24) in her mother; Other in an other family member. She was adopted.   ROS:   Please see the history of present illness.    Review of Systems  Constitution: Negative.  HENT: Negative.   Eyes: Negative.   Cardiovascular: Positive for dyspnea on exertion.  Respiratory: Positive for cough, sputum production and wheezing.   Hematologic/Lymphatic: Bruises/bleeds easily.  Musculoskeletal: Negative.  Negative for joint pain.  Gastrointestinal: Negative.   Genitourinary: Negative.   Neurological: Positive for loss of balance.   All other systems reviewed and are negative.   PHYSICAL EXAM:   VS:  BP 126/80   Pulse 84   Ht 5' 2" (1.575 m)   Wt 222 lb 6.4 oz (100.9 kg)   SpO2 94%   BMI 40.68 kg/m   Physical Exam  GEN: Obese, in no acute distress  Neck: no JVD, carotid bruits, or masses Cardiac:RRR; no murmurs, rubs, or gallops  Respiratory: Decreased breath sounds throughout with fine crackles at the bases GI: soft, nontender, nondistended, + BS Ext: without cyanosis, clubbing, or edema, Good distal pulses bilaterally Neuro:  Alert and Oriented x 3 Psych: euthymic mood, full affect  Wt Readings from Last 3 Encounters:  10/30/18 222 lb 6.4 oz (100.9  kg)  10/29/18 221 lb 9.6 oz (100.5 kg)  10/29/18 221 lb (100.2 kg)      Studies/Labs Reviewed:   EKG:  EKG is not ordered today.   Recent Labs: 09/24/2018: BUN 19; Creatinine, Ser 1.18; Hemoglobin 11.8; Platelets 237.0; Potassium 3.9; Pro B Natriuretic peptide (BNP) 234.0; Sodium 142   Lipid Panel    Component Value Date/Time   CHOL 154 09/13/2016 0738   TRIG 175 (H) 09/13/2016 0738   HDL 31 (L) 09/13/2016 0738   CHOLHDL 5.0 (H) 09/13/2016 0738   VLDL 35 (H) 09/13/2016 0738   LDLCALC 88 09/13/2016 0738   LDLDIRECT 111.0 01/05/2015 1019    Additional studies/ records that were reviewed today include:  11/06/2017  1:59 PM EST    Coronary Findings    Diagnostic  Dominance: Right  Left Main  Vessel was injected. Vessel is large.  Left Anterior Descending  Vessel was injected. Vessel is large. The vessel is moderately tortuous. Overall appearance is not significantly different than prior studies in 2011 and 2012.  Ost LAD lesion 30% stenosed  Ost LAD lesion is 30% stenosed. The lesion is eccentric. The lesion is calcified.  Prox LAD to Mid LAD lesion 50% stenosed  Prox LAD to Mid LAD lesion is 50% stenosed. The lesion is eccentric. The lesion is moderately calcified.  Mid LAD lesion 50% stenosed  Mid LAD lesion is 50% stenosed.  Mid LAD to Dist LAD lesion 40% stenosed  Mid LAD to Dist LAD lesion is 40% stenosed.  First Diagonal Branch  Vessel is small in size.  Second Diagonal Branch  Vessel is large in size.  Third Diagonal Branch  Vessel is small in size.  Left Circumflex  Vessel was injected. Vessel is large.  Ost Cx lesion 25% stenosed  Ost Cx lesion is 25% stenosed.  Prox Cx lesion 25% stenosed  Prox Cx lesion is 25% stenosed.  Prox Cx to Mid Cx lesion 60% stenosed  Prox Cx to Mid Cx lesion is 60% stenosed. The lesion was previously treated using a bare metal stent over 2 years ago. Previously placed stent displays restenosis. Proximal stent has in-stent  restenosis of up to 50-60%.  First Obtuse Marginal Branch  Vessel is small in size.  Second Obtuse Marginal Branch  Vessel is small in size.  Third Obtuse Marginal Branch  Vessel is large in size.  Right Coronary Artery  Vessel was injected. Vessel is large. The vessel is mildly tortuous.  Prox RCA to Mid RCA lesion 25% stenosed  Prox RCA to Mid RCA lesion is 25% stenosed. The lesion is eccentric. The lesion is calcified.  Intervention    Prox Cx lesion  Stent (Also treats lesions: Prox Cx to Mid Cx)  A drug-eluting stent was successfully placed using a STENT SYNERGY DES 3X20. Maximum pressure: 14 atm. Stent strut is well apposed. Stent overlaps previously placed stent.  Post-Intervention Lesion Assessment  There is no residual stenosis post intervention.  Prox Cx to Mid Cx lesion  Stent (Also treats lesions: Prox Cx)  A drug-eluting stent was successfully placed using a STENT SYNERGY DES 3X20. Maximum pressure: 14 atm. Stent strut is well apposed. Stent overlaps previously placed stent.  Post-Intervention Lesion Assessment  There is no residual stenosis post intervention.  Left Heart    Left Ventricle LV end diastolic pressure is mildly elevated. LVEDP 20-22 mmHg.    Echo 09-29-2017: Left ventricle:  The cavity size was normal. Wall thickness was increased in a pattern of mild LVH. Systolic function was normal. The estimated ejection fraction was in the range of 50% to 55%. Wall motion was normal; there were no regional wall motion abnormalities. Doppler parameters are consistent with abnormal left ventricular relaxation (grade 1 diastolic dysfunction).   ------------------------------------------------------------------- Myoview 10-13-2017: Study Highlights       Nuclear stress EF: 51%.  There was no ST segment deviation noted during stress.  Defect 1: There is a medium defect of moderate severity present in the basal inferior, basal inferolateral, mid inferior and mid  inferolateral location.  This is an intermediate risk study.  Findings consistent with ischemia.  The left ventricular ejection fraction is mildly decreased (45-54%).   There is a medium size, moderate severity reversible defect in the basal and mid inferior and inferolateral walls consistent with ischemia (SDS =4).             ASSESSMENT:    1. Chronic combined systolic and diastolic heart failure ([place])   2. Coronary artery disease involving native coronary artery of native heart without angina pectoris   3. Essential hypertension   4. CKD (chronic kidney disease), stage III ([place])   5. Chronic obstructive pulmonary disease, unspecified COPD type ([place])      PLAN:  In order of problems listed above:  Chronic combined systolic and diastolic CHF ejection fraction 50 to 55% with grade 1 DD on echo 09/2017.  Had acute exacerbation in December when she was not taking Lasix regularly and eating high salt diet, edema is greatly improved and she is taking her medications regularly.  Will check renal function and BNP today.  She does have crackles on exam but is recovering from a URI.  CAD status post BMS to the circumflex in 2007, DES to the circumflex 10/25/2017 with residual nonobstructive disease in the LAD and RCA stable  without angina  Essential hypertension blood pressure well controlled  CKD stage III check renal function today.  COPD followed by Dr. Palma Holter changed and she is picking it up today.  Medication Adjustments/Labs and Tests Ordered: Current medicines are reviewed at length with the patient today.  Concerns regarding medicines are outlined above.  Medication changes, Labs and Tests ordered today are listed in the Patient Instructions below. Patient Instructions  Medication Instructions:  Your physician recommends that you continue on your current medications as directed. Please refer to the Current Medication list given to you today.  If you need a refill on  your cardiac medications before your next appointment, please call your pharmacy.   Lab work: Personnel officer Today: BMET, BNP If you have labs (blood work) drawn today and your tests are completely normal, you will receive your results only by: Marland Kitchen MyChart Message (if you have MyChart) OR . A paper copy in the mail If you have any lab test that is abnormal or we need to change your treatment, we will call you to review the results.  Testing/Procedures: None ordered  Follow-Up: At [facility], you and your health needs are our priority.  As part of our continuing mission to provide you with exceptional heart care, we have created designated Provider Care Teams.  These Care Teams include your primary Cardiologist (physician) and Advanced Practice Providers (APPs -  Physician Assistants and Nurse Practitioners) who all work together to provide you with the care you need, when you need it. You will need a follow up appointment in:  6 months.  Please call our office 2 months in advance to schedule this appointment.  You may see Sherren Mocha, MD or one of the following Advanced Practice Providers on your designated Care Team: Richardson Dopp, PA-C [place], [place] . Daune Perch, NP  Any Other Special Instructions Will Be Listed Below (If Applicable).       Sumner Boast, PA-C  10/30/2018 8:18 AM    [facility] [place], [place], [place]  [address] Phone: [phone]; Fax: [phone]

## 2018-10-29 ENCOUNTER — Encounter: Payer: Self-pay | Admitting: *Deleted

## 2018-10-29 ENCOUNTER — Ambulatory Visit (INDEPENDENT_AMBULATORY_CARE_PROVIDER_SITE_OTHER): Payer: Medicare Other | Admitting: Family Medicine

## 2018-10-29 ENCOUNTER — Encounter: Payer: Self-pay | Admitting: Family Medicine

## 2018-10-29 ENCOUNTER — Ambulatory Visit (INDEPENDENT_AMBULATORY_CARE_PROVIDER_SITE_OTHER): Payer: Medicare Other | Admitting: Emergency Medicine

## 2018-10-29 ENCOUNTER — Encounter: Payer: Self-pay | Admitting: Emergency Medicine

## 2018-10-29 VITALS — BP 112/70 | HR 88 | Temp 98.3°F | Resp 16 | Ht 62.0 in | Wt 221.6 lb

## 2018-10-29 DIAGNOSIS — I2510 Atherosclerotic heart disease of native coronary artery without angina pectoris: Secondary | ICD-10-CM

## 2018-10-29 DIAGNOSIS — I10 Essential (primary) hypertension: Secondary | ICD-10-CM

## 2018-10-29 DIAGNOSIS — N183 Chronic kidney disease, stage 3 unspecified: Secondary | ICD-10-CM

## 2018-10-29 DIAGNOSIS — M150 Primary generalized (osteo)arthritis: Secondary | ICD-10-CM | POA: Diagnosis not present

## 2018-10-29 DIAGNOSIS — F419 Anxiety disorder, unspecified: Secondary | ICD-10-CM

## 2018-10-29 DIAGNOSIS — K219 Gastro-esophageal reflux disease without esophagitis: Secondary | ICD-10-CM

## 2018-10-29 DIAGNOSIS — J301 Allergic rhinitis due to pollen: Secondary | ICD-10-CM

## 2018-10-29 DIAGNOSIS — M8949 Other hypertrophic osteoarthropathy, multiple sites: Secondary | ICD-10-CM

## 2018-10-29 DIAGNOSIS — J449 Chronic obstructive pulmonary disease, unspecified: Secondary | ICD-10-CM

## 2018-10-29 DIAGNOSIS — M159 Polyosteoarthritis, unspecified: Secondary | ICD-10-CM

## 2018-10-29 MED ORDER — PANTOPRAZOLE SODIUM 20 MG PO TBEC: 20.0000 mg | DELAYED_RELEASE_TABLET | Freq: Every day | ORAL | 3 refills | Status: DC

## 2018-10-29 MED ORDER — HYDROCODONE-ACETAMINOPHEN 5-325 MG PO TABS: 1.0000 | ORAL_TABLET | Freq: Every day | ORAL | 0 refills | Status: DC | PRN

## 2018-10-29 NOTE — Progress Notes (Signed)
Subjective:    Patient ID: Tracy [name], female    DOB: October 20, 1945, 73 y.o.   MRN: [id number]  Shortness of Breath  Associated symptoms include ear pain and headaches. Pertinent negatives include no fever, leg swelling, rash, rhinorrhea, sore throat, vomiting or wheezing.    ROV 08/02/18 --patient follows up today for her history of COPD (mixed obstruction and restriction), coronary disease with hypertension and diastolic CHF.  I saw her 2 months ago and we decided to try starting long-acting bronchodilator for her contribution from COPD.  We tried Spiriva Respimat.  She reports that she believes that it did help her breathing and her functional capacity. It is too costly, her formulary apparently prefers Incruse (and trelegy, bevespi). She had her flu shot. She is up to date on PNA shot.   ROV 10/29/18 --73 year old woman with a history of mixed obstruction and restriction, COPD.  She also has coronary disease, hypertension with diastolic dysfunction.  We have had difficulty getting her on a stable inhaled regimen due to cost.  She was seen here in December for what sounded like an acute exacerbation of COPD and possibly also some contribution of her diastolic CHF.  She was only marginally compliant with both her diuretics and her Incruse.  She was changed to Anoro - ran out 1 week ago, did not refill it. High deductible for a Tier 3 med. She has been dealing with a URI, improving but still symptomatic. She uses albuterol about few times a week. Needs it when walking, is able to shop. Uses a walker. Zyrtec every day, protonix 20 qd.    Review of Systems  Constitutional: Positive for unexpected weight change. Negative for fever.  HENT: Positive for dental problem and ear pain. Negative for congestion, nosebleeds, postnasal drip, rhinorrhea, sinus pressure, sneezing, sore throat and trouble swallowing.   Eyes: Negative for redness and itching.  Respiratory: Positive for cough and shortness  of breath. Negative for chest tightness and wheezing.   Cardiovascular: Negative for palpitations and leg swelling.  Gastrointestinal: Negative for nausea and vomiting.  Genitourinary: Negative for dysuria.  Musculoskeletal: Positive for joint swelling.  Skin: Negative for rash.  Neurological: Positive for headaches.  Hematological: Does not bruise/bleed easily.  Psychiatric/Behavioral: Negative for dysphoric mood. The patient is nervous/anxious.     Past Medical History:  Diagnosis Date  . Allergic rhinitis   . CAD (coronary artery disease)    1/19 PCI/DES to [place] for ISR, normal EF.   Marland Kitchen CHF (congestive heart failure) ([place])   . Gout   . Headache(784.0)   . Hyperlipidemia   . Hypertension   . Low back pain   . Menopausal syndrome   . Myocardial infarct ([place]) 2007   hx of  . Overactive bladder      Family History  Adopted: Yes  Problem Relation Age of Onset  . Colon cancer Mother 69  . Other Unknown        patient is adopted  . Stomach cancer Neg Hx      Social History   Socioeconomic History  . Marital status: Married    Spouse name: Not on file  . Number of children: Not on file  . Years of education: Not on file  . Highest education level: Not on file  Occupational History  . Occupation: direct Civil engineer, contracting  Social Needs  . Financial resource strain: Not on file  . Food insecurity:    Worry: Not on file  Inability: Not on file  . Transportation needs:    Medical: No    Non-medical: No  Tobacco Use  . Smoking status: Former Smoker    Packs/day: 0.20    Years: 50.00    Pack years: 10.00    Last attempt to quit: 07/01/2012    Years since quitting: 6.3  . Smokeless tobacco: Never Used  . Tobacco comment: completely quit May of 2018; period of years she did not smoke   Substance and Sexual Activity  . Alcohol use: No  . Drug use: No  . Sexual activity: Not on file  Lifestyle  . Physical activity:    Days per week: 0 days    Minutes per  session: 0 min  . Stress: Only a little  Relationships  . Social connections:    Talks on phone: Not on file    Gets together: Not on file    Attends religious service: Not on file    Active member of club or organization: Not on file    Attends meetings of clubs or organizations: Not on file    Relationship status: Not on file  . Intimate partner violence:    Fear of current or ex partner: Not on file    Emotionally abused: Not on file    Physically abused: Not on file    Forced sexual activity: Not on file  Other Topics Concern  . Not on file  Social History Narrative  . Not on file   from [place], has worked at [employer], Press photographer.  Lives in an old house, no current water damage, no mold.  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    Allergies  Allergen Reactions  . Erythromycin Rash    But isn't certain  . Sulfamethoxazole Rash     Outpatient Medications Prior to Visit  Medication Sig Dispense Refill  . albuterol (PROVENTIL HFA;VENTOLIN HFA) 108 (90 Base) MCG/ACT inhaler Inhale 2 puffs into the lungs every 4 (four) hours as needed for wheezing or shortness of breath. 1 Inhaler 5  . allopurinol (ZYLOPRIM) 100 MG tablet TAKE 1 TABLET BY MOUTH EVERY DAY 90 tablet 0  . ALPRAZolam (XANAX) 0.25 MG tablet TAKE 1 TABLET TWICE A DAY AS NEEDED FOR ANXIETY 60 tablet 0  . aspirin EC 81 MG tablet Take 81 mg by mouth daily.    Marland Kitchen atorvastatin (LIPITOR) 40 MG tablet TAKE 1 TABLET DAILY BY MOUTH. PLEASE KEEP UPCOMING APPT FOR FUTURE REFILLS. THANKS 90 tablet 3  . calcium carbonate (CALCIUM 600) 600 MG TABS tablet Take 600 mg by mouth See admin instructions. Once every 2 weeks    . cetirizine (ZYRTEC) 10 MG tablet Take 10 mg by mouth daily.    . Cholecalciferol (VITAMIN D3 PO) Take 1,000 Units by mouth daily.    . clopidogrel (PLAVIX) 75 MG tablet TAKE 1 TABLET (75 MG TOTAL) BY MOUTH DAILY WITH BREAKFAST. 90 tablet 1  . Coenzyme Q10 (CO Q 10 PO) Take 300 mg by mouth See admin instructions. Once  every 2 weeks.    . Cyanocobalamin (VITAMIN B-12) 5000 MCG SUBL Place 10,000 mcg under the tongue daily.     . diclofenac sodium (VOLTAREN) 1 % GEL Apply 4 g topically 4 (four) times daily. 4 Tube 3  . furosemide (LASIX) 40 MG tablet Take 1 tablet (40 mg total) by mouth daily. 90 tablet 3  . HYDROcodone-acetaminophen (NORCO/VICODIN) 5-325 MG tablet Take 1 tablet by mouth every 6 (six) hours as needed for  moderate pain. 30 tablet 0  . KLOR-CON M20 20 MEQ tablet TAKE 1 TABLET (20 MEQ TOTAL) DAILY BY MOUTH. 90 tablet 3  . losartan (COZAAR) 100 MG tablet TAKE 1 TABLET BY MOUTH EVERY DAY 90 tablet 3  . metoprolol tartrate (LOPRESSOR) 100 MG tablet Take 1 tablet (100 mg total) by mouth as directed. Please take 100 mg in the morning and 50 mg at bedtime 180 tablet 3  . Misc Natural Products (OSTEO BI-FLEX ADV JOINT SHIELD PO) Take by mouth.    . Misc Natural Products (TART CHERRY ADVANCED) CAPS Take 1,000 capsules by mouth daily.     . nitroGLYCERIN (NITROSTAT) 0.4 MG SL tablet Place 1 tablet (0.4 mg total) under the tongue every 5 (five) minutes as needed for chest pain. 25 tablet 3  . NON FORMULARY 83.3 mg daily.    . pantoprazole (PROTONIX) 20 MG tablet Take 1 tablet (20 mg total) by mouth daily. 90 tablet 1  . umeclidinium-vilanterol (ANORO ELLIPTA) 62.5-25 MCG/INH AEPB Inhale 1 puff into the lungs daily. 60 each 3  . vitamin E 200 UNIT capsule Take 200 Units by mouth as needed.     . doxycycline (VIBRA-TABS) 100 MG tablet Take 1 tablet (100 mg total) by mouth 2 (two) times daily. 14 tablet 0  . Omega-3 Fatty Acids (FISH OIL OMEGA-3 PO) Take 1,400 mg by mouth See admin instructions. Once every couple weeks    . potassium chloride (K-DUR) 10 MEQ tablet Take 1 tablet (10 mEq total) by mouth daily. 90 tablet 3  . predniSONE (DELTASONE) 10 MG tablet Take 2 tabs x 5 days 10 tablet 0   No facility-administered medications prior to visit.         Objective:   Physical Exam Vitals:   10/29/18 0903    BP: 126/84  Pulse: 78  SpO2: 98%  Weight: 221 lb (100.2 kg)  Height: 5\' 2"  (1.575 m)   Gen: Pleasant, overwt, in no distress,  normal affect, occasionally coughing  ENT: No lesions,  mouth clear,  oropharynx clear, no postnasal drip  Neck: No JVD, no stridor  Lungs: No use of accessory muscles, slightly distant, no wheeze or crackles.   Cardiovascular: RRR, heart sounds normal, no murmur or gallops, no peripheral edema  Musculoskeletal: No deformities, no cyanosis or clubbing  Neuro: alert, non focal  Skin: Warm, no lesions or rash     Assessment & Plan:  COPD (chronic obstructive pulmonary disease) (HCC) Recent URI but she did not need to be treated for an acute exacerbation.  Is not wheezing on exam today.  I think that she is getting over it without a true flare.  She has had difficulty getting her medications due to cost, insurance coverage.  She is currently on Anoro but had to deal with a high upfront cost due to the deductible on a tier 3 medication.  We discussed possibly changing to nebs.  In the end we decided we would continue the Anoro and she would absorb the upfront cost.  We will try to supplement her as we are able.  Please restart Anoro 1 inhalation once daily. Keep albuterol available to use 2 puffs up to every 4 hours if needed for shortness of breath, chest tightness, wheezing.  Flu shot up-to-date, pneumonia shot up-to-date. Follow with Dr Lamonte Sakai in 6 months or sooner if you have any problems  Allergic rhinitis  Continue Zyrtec as you have been taking it  GERD Continue your pantoprazole 20 mg daily  as you have been taking it.  Baltazar Apo, MD, PhD 10/29/2018, 9:35 AM [facility] [phone] or if no answer [phone]

## 2018-10-29 NOTE — Assessment & Plan Note (Signed)
Educated about diagnosis and possible etiologies. Adequate hydration. New with BP control. Low-salt diet and avoidance of NSAIDs also recommended. We will continue following. BMP already scheduled for tomorrow through her cardiologist's office.

## 2018-10-29 NOTE — Assessment & Plan Note (Signed)
No changes in hydrocodone-acetaminophen 5-325 mg daily as needed. We discussed some side effects of medication. Continue Voltaren gel 4 times daily as needed. Fall precautions discussed. Med contract signed today. Follow-up in 5 to 6 months given the fact prescription for hydrocodone (#30 tabs)  last months.

## 2018-10-29 NOTE — Progress Notes (Signed)
HPI:   [name] is a 72 y.o. female, who is here today with her husband for 4 months follow up.   She was last seen on 06/27/18.  Chronic pain: Generalized OA, mainly left knee, and lower back pain.  Currently she is on hydrocodone-acetaminophen 5-325 mg daily as needed. Last prescription was in 05/2018. Usually 30 tablets of hydrocodone last over a month.  Occasionally she also takes hydrocodone for headaches, this is seldom.   GERD: Last visit we decreased Protonix dose from 40 mg to 20 mg. Symptoms have been well controlled with Protonix 20 mg daily.  Denies abdominal pain, nausea, vomiting, changes in bowel habits, blood in stool or melena.  COPD, last visit she was started on Combivent due to cost of Spiriva.  Combivent was as expensive as Spiriva, so she was started on different inhaler, Anoro Ellipta. He follows with pulmonologist, Dr. Lamonte [name], last visit on 08/02/2017.  Hypertension:  Currently on Cozaar 100 mg daily and metoprolol tartrate 100 mg twice daily. She is taking medications as instructed, no side effects reported. She is also on furosemide 40 mg daily.  She has not noted unusual headache, visual changes, exertional chest pain, dyspnea,  focal weakness, or worsening edema.  CAD and CHF, she follows with cardiologist, next appointment is tomorrow. Hypokalemia, she takes K-Lor 20 mEq daily.   Lab Results  Component Value Date   CREATININE 1.18 09/24/2018   BUN 19 09/24/2018   NA 142 09/24/2018   K 3.9 09/24/2018   CL 107 09/24/2018   CO2 26 09/24/2018   Concerned about diagnosis of CKD 3 in her problem list.  She denies decreased urine output, foam the urine, or gross hematuria. Cr 1.1-1.5, e GFR upper 30's-40's.  Review of Systems  Constitutional: Negative for activity change, appetite change, fatigue and fever.  HENT: Negative for mouth sores, nosebleeds and trouble swallowing.   Eyes: Negative for redness and visual  disturbance.  Respiratory: Positive for cough. Negative for shortness of breath and wheezing.   Cardiovascular: Negative for chest pain, palpitations and leg swelling.  Gastrointestinal: Negative for abdominal pain, nausea and vomiting.       Negative for changes in bowel habits.  Genitourinary: Negative for decreased urine volume, dysuria and hematuria.  Musculoskeletal: Positive for arthralgias, back pain and gait problem.  Neurological: Positive for headaches (occasionally). Negative for syncope and weakness.  Psychiatric/Behavioral: Negative for confusion. The patient is nervous/anxious.     Current Outpatient Medications on File Prior to Visit  Medication Sig Dispense Refill  . albuterol (PROVENTIL HFA;VENTOLIN HFA) 108 (90 Base) MCG/ACT inhaler Inhale 2 puffs into the lungs every 4 (four) hours as needed for wheezing or shortness of breath. 1 Inhaler 5  . allopurinol (ZYLOPRIM) 100 MG tablet TAKE 1 TABLET BY MOUTH EVERY DAY 90 tablet 0  . aspirin EC 81 MG tablet Take 81 mg by mouth daily.    Marland Kitchen atorvastatin (LIPITOR) 40 MG tablet TAKE 1 TABLET DAILY BY MOUTH. PLEASE KEEP UPCOMING APPT FOR FUTURE REFILLS. THANKS 90 tablet 3  . calcium carbonate (CALCIUM 600) 600 MG TABS tablet Take 600 mg by mouth See admin instructions. Once every 2 weeks    . cetirizine (ZYRTEC) 10 MG tablet Take 10 mg by mouth daily.    . Cholecalciferol (VITAMIN D3 PO) Take 1,000 Units by mouth daily.    . clopidogrel (PLAVIX) 75 MG tablet TAKE 1 TABLET (75 MG TOTAL) BY MOUTH DAILY WITH BREAKFAST. 90 tablet  1  . Coenzyme Q10 (CO Q 10 PO) Take 300 mg by mouth See admin instructions. Once every 2 weeks.    . Cyanocobalamin (VITAMIN B-12) 5000 MCG SUBL Place 10,000 mcg under the tongue daily.     . diclofenac sodium (VOLTAREN) 1 % GEL Apply 4 g topically 4 (four) times daily. 4 Tube 3  . furosemide (LASIX) 40 MG tablet Take 1 tablet (40 mg total) by mouth daily. 90 tablet 3  . Garlic 8250 MG CAPS Take by mouth daily.     Marland Kitchen KLOR-CON M20 20 MEQ tablet TAKE 1 TABLET (20 MEQ TOTAL) DAILY BY MOUTH. 90 tablet 3  . losartan (COZAAR) 100 MG tablet TAKE 1 TABLET BY MOUTH EVERY DAY 90 tablet 3  . metoprolol tartrate (LOPRESSOR) 100 MG tablet Take 1 tablet (100 mg total) by mouth as directed. Please take 100 mg in the morning and 50 mg at bedtime 180 tablet 3  . Misc Natural Products (OSTEO BI-FLEX ADV JOINT SHIELD PO) Take by mouth.    . Misc Natural Products (TART CHERRY ADVANCED) CAPS Take 1,000 capsules by mouth daily.     . nitroGLYCERIN (NITROSTAT) 0.4 MG SL tablet Place 1 tablet (0.4 mg total) under the tongue every 5 (five) minutes as needed for chest pain. 25 tablet 3  . NON FORMULARY 83.3 mg daily.    Marland Kitchen umeclidinium-vilanterol (ANORO ELLIPTA) 62.5-25 MCG/INH AEPB Inhale 1 puff into the lungs daily. 60 each 3  . vitamin E 200 UNIT capsule Take 200 Units by mouth as needed.      No current facility-administered medications on file prior to visit.      Past Medical History:  Diagnosis Date  . Allergic rhinitis   . CAD (coronary artery disease)    1/19 PCI/DES to [place] for ISR, normal EF.   Marland Kitchen CHF (congestive heart failure) ([place])   . Gout   . Headache(784.0)   . Hyperlipidemia   . Hypertension   . Low back pain   . Menopausal syndrome   . Myocardial infarct ([place]) 2007   hx of  . Overactive bladder    Allergies  Allergen Reactions  . Erythromycin Rash    But isn't certain  . Sulfamethoxazole Rash    Social History   Socioeconomic History  . Marital status: Married    Spouse name: Not on file  . Number of children: Not on file  . Years of education: Not on file  . Highest education level: Not on file  Occupational History  . Occupation: direct Civil engineer, contracting  Social Needs  . Financial resource strain: Not on file  . Food insecurity:    Worry: Not on file    Inability: Not on file  . Transportation needs:    Medical: No    Non-medical: No  Tobacco Use  . Smoking status: Former  Smoker    Packs/day: 0.20    Years: 50.00    Pack years: 10.00    Last attempt to quit: 07/01/2012    Years since quitting: 6.3  . Smokeless tobacco: Never Used  . Tobacco comment: completely quit May of 2018; period of years she did not smoke   Substance and Sexual Activity  . Alcohol use: No  . Drug use: No  . Sexual activity: Not on file  Lifestyle  . Physical activity:    Days per week: 0 days    Minutes per session: 0 min  . Stress: Only a little  Relationships  .  Social connections:    Talks on phone: Not on file    Gets together: Not on file    Attends religious service: Not on file    Active member of club or organization: Not on file    Attends meetings of clubs or organizations: Not on file    Relationship status: Not on file  Other Topics Concern  . Not on file  Social History Narrative  . Not on file    Vitals:   10/29/18 1454  BP: 112/70  Pulse: 88  Resp: 16  Temp: 98.3 F (36.8 C)   Body mass index is 40.53 kg/m.    Physical Exam  Nursing note and vitals reviewed. Constitutional: She is oriented to person, place, and time. She appears well-developed. No distress.  HENT:  Head: Normocephalic and atraumatic.  Mouth/Throat: Oropharynx is clear and moist and mucous membranes are normal.  Eyes: Pupils are equal, round, and reactive to light. Conjunctivae are normal.  Cardiovascular: Normal rate and regular rhythm.  No murmur heard. DP pulses present bilateral.  Respiratory: Effort normal and breath sounds normal. No respiratory distress.  GI: Soft. She exhibits no mass. There is no hepatomegaly. There is no abdominal tenderness.  Musculoskeletal:        General: No edema.     Lumbar back: She exhibits no tenderness and no bony tenderness.     Comments: Heberden's node (DIP), bilateral. No signs of synovitis. Mild limitation of knee flexion, bilateral crepitus, L>R.   Lymphadenopathy:    She has no cervical adenopathy.  Neurological: She is  alert and oriented to person, place, and time. She has normal strength. No cranial nerve deficit. Gait abnormal.  Unstable gait assisted with a cane.  Skin: Skin is warm. No rash noted. No erythema.  Psychiatric: She has a normal mood and affect.  Well groomed, good eye contact.     ASSESSMENT AND PLAN:   Ms. Tracy [name] was seen today for 4 months follow-up.  No orders of the defined types were placed in this encounter.  Osteoarthritis (arthritis due to wear and tear of joints) No changes in hydrocodone-acetaminophen 5-325 mg daily as needed. We discussed some side effects of medication. Continue Voltaren gel 4 times daily as needed. Fall precautions discussed. Med contract signed today. Follow-up in 5 to 6 months given the fact prescription for hydrocodone (#30 tabs)  last months.  GERD Well-controlled with Protonix 20 mg daily. No changes in current management. GERD precautions. Follow-up in 12 months, before if needed.  CKD (chronic kidney disease), stage III ([place]) Educated about diagnosis and possible etiologies. Adequate hydration. New with BP control. Low-salt diet and avoidance of NSAIDs also recommended. We will continue following. BMP already scheduled for tomorrow through her cardiologist's office.  Anxiety disorder No changes in alprazolam 0.25 mg daily as needed. We discussed some side effects. Follow-up in 5 to 6 months, before if needed.  Essential hypertension BP adequately controlled. Recommend monitoring BP at home. No changes in current management. She has lab appointment tomorrow at her cardiologist's office.  Return in about 6 months (around 04/29/2019) for HTN,anx,pain.     Albeiro Trompeter G. [name], MD  [facility]. [place] office.

## 2018-10-29 NOTE — Assessment & Plan Note (Signed)
Well-controlled with Protonix 20 mg daily. No changes in current management. GERD precautions. Follow-up in 12 months, before if needed.

## 2018-10-29 NOTE — Assessment & Plan Note (Signed)
  Continue Zyrtec as you have been taking it

## 2018-10-29 NOTE — Patient Instructions (Addendum)
A few things to remember from today's visit:   CKD (chronic kidney disease), stage III (HCC)  Primary osteoarthritis involving multiple joints - Plan: HYDROcodone-acetaminophen (NORCO/VICODIN) 5-325 MG tablet  Gastroesophageal reflux disease, esophagitis presence not specified - Plan: pantoprazole (PROTONIX) 20 MG tablet  Essential hypertension  Anxiety disorder, unspecified type   Please be sure medication list is accurate. If a new problem present, please set up appointment sooner than planned today.

## 2018-10-29 NOTE — Assessment & Plan Note (Signed)
Continue your pantoprazole 20 mg daily as you have been taking it.

## 2018-10-29 NOTE — Patient Instructions (Addendum)
Please restart Anoro 1 inhalation once daily. Keep albuterol available to use 2 puffs up to every 4 hours if needed for shortness of breath, chest tightness, wheezing.  Flu shot up-to-date, pneumonia shot up-to-date Continue your pantoprazole 20 mg daily as you have been taking it. Continue Zyrtec as you have been taking it. Follow with Dr Lamonte Sakai in 6 months or sooner if you have any problems

## 2018-10-29 NOTE — Assessment & Plan Note (Addendum)
Recent URI but she did not need to be treated for an acute exacerbation.  Is not wheezing on exam today.  I think that she is getting over it without a true flare.  She has had difficulty getting her medications due to cost, insurance coverage.  She is currently on Anoro but had to deal with a high upfront cost due to the deductible on a tier 3 medication.  We discussed possibly changing to nebs.  In the end we decided we would continue the Anoro and she would absorb the upfront cost.  We will try to supplement her as we are able.  Please restart Anoro 1 inhalation once daily. Keep albuterol available to use 2 puffs up to every 4 hours if needed for shortness of breath, chest tightness, wheezing.  Flu shot up-to-date, pneumonia shot up-to-date. Follow with Dr Lamonte Sakai in 6 months or sooner if you have any problems

## 2018-10-29 NOTE — Assessment & Plan Note (Signed)
No changes in alprazolam 0.25 mg daily as needed. We discussed some side effects. Follow-up in 5 to 6 months, before if needed.

## 2018-10-30 ENCOUNTER — Other Ambulatory Visit: Payer: Medicare Other

## 2018-10-30 ENCOUNTER — Ambulatory Visit (INDEPENDENT_AMBULATORY_CARE_PROVIDER_SITE_OTHER): Payer: Medicare Other | Admitting: Physician Assistant

## 2018-10-30 ENCOUNTER — Encounter: Payer: Self-pay | Admitting: Physician Assistant

## 2018-10-30 VITALS — BP 126/80 | HR 84 | Ht 62.0 in | Wt 222.4 lb

## 2018-10-30 DIAGNOSIS — N183 Chronic kidney disease, stage 3 unspecified: Secondary | ICD-10-CM

## 2018-10-30 DIAGNOSIS — I2510 Atherosclerotic heart disease of native coronary artery without angina pectoris: Secondary | ICD-10-CM

## 2018-10-30 DIAGNOSIS — I10 Essential (primary) hypertension: Secondary | ICD-10-CM

## 2018-10-30 DIAGNOSIS — I5042 Chronic combined systolic (congestive) and diastolic (congestive) heart failure: Secondary | ICD-10-CM

## 2018-10-30 DIAGNOSIS — J449 Chronic obstructive pulmonary disease, unspecified: Secondary | ICD-10-CM

## 2018-10-30 LAB — BASIC METABOLIC PANEL
BUN/Creatinine Ratio: 18 (ref 12–28)
BUN: 22 mg/dL (ref 8–27)
CO2: 21 mmol/L (ref 20–29)
Calcium: 9.1 mg/dL (ref 8.7–10.3)
Chloride: 105 mmol/L (ref 96–106)
Creatinine, Ser: 1.21 mg/dL — ABNORMAL HIGH (ref 0.57–1.00)
GFR calc Af Amer: 52 mL/min/{1.73_m2} — ABNORMAL LOW (ref >59)
GFR calc non Af Amer: 45 mL/min/{1.73_m2} — ABNORMAL LOW (ref >59)
Glucose: 106 mg/dL — ABNORMAL HIGH (ref 65–99)
Potassium: 4.6 mmol/L (ref 3.5–5.2)
Sodium: 142 mmol/L (ref 134–144)

## 2018-10-30 LAB — PRO B NATRIURETIC PEPTIDE: NT-Pro BNP: 271 pg/mL (ref 0–301)

## 2018-10-30 MED ORDER — ALPRAZOLAM 0.25 MG PO TABS: ORAL_TABLET | ORAL | 0 refills | Status: DC

## 2018-10-30 NOTE — Patient Instructions (Addendum)
Medication Instructions:  Your physician recommends that you continue on your current medications as directed. Please refer to the Current Medication list given to you today.  If you need a refill on your cardiac medications before your next appointment, please call your pharmacy.   Lab work: Personnel officer Today: BMET, BNP If you have labs (blood work) drawn today and your tests are completely normal, you will receive your results only by: Marland Kitchen MyChart Message (if you have MyChart) OR . A paper copy in the mail If you have any lab test that is abnormal or we need to change your treatment, we will call you to review the results.  Testing/Procedures: None ordered  Follow-Up: At [facility], you and your health needs are our priority.  As part of our continuing mission to provide you with exceptional heart care, we have created designated Provider Care Teams.  These Care Teams include your primary Cardiologist (physician) and Advanced Practice Providers (APPs -  Physician Assistants and Nurse Practitioners) who all work together to provide you with the care you need, when you need it. You will need a follow up appointment in:  6 months.  Please call our office 2 months in advance to schedule this appointment.  You may see Sherren Mocha, MD or one of the following Advanced Practice Providers on your designated Care Team: Richardson Dopp, PA-C [place], [place] . Daune Perch, NP  Any Other Special Instructions Will Be Listed Below (If Applicable).

## 2018-11-01 ENCOUNTER — Telehealth: Payer: Self-pay

## 2018-11-01 DIAGNOSIS — N183 Chronic kidney disease, stage 3 unspecified: Secondary | ICD-10-CM

## 2018-11-01 NOTE — Telephone Encounter (Signed)
Patient made aware of results and recommendations for repeat BMET in 1 month. Appointment made for 2/17. Patient verbalizes understanding and thanked me for the call.

## 2018-11-01 NOTE — Telephone Encounter (Signed)
-----  Message from Imogene Burn, PA-C sent at 10/31/2018  2:26 PM EST ----- Kidney function up slightly from a month ago but close to baseline.  Continue current treatment.  Repeat be met in 1 month.  Heart failure marker was normal.

## 2018-11-08 DIAGNOSIS — M25561 Pain in right knee: Secondary | ICD-10-CM | POA: Diagnosis not present

## 2018-11-08 DIAGNOSIS — M25562 Pain in left knee: Secondary | ICD-10-CM | POA: Diagnosis not present

## 2018-12-03 ENCOUNTER — Other Ambulatory Visit: Payer: Medicare Other | Admitting: *Deleted

## 2018-12-03 DIAGNOSIS — N183 Chronic kidney disease, stage 3 unspecified: Secondary | ICD-10-CM

## 2018-12-03 LAB — BASIC METABOLIC PANEL
BUN/Creatinine Ratio: 23 (ref 12–28)
BUN: 25 mg/dL (ref 8–27)
CO2: 22 mmol/L (ref 20–29)
Calcium: 9.6 mg/dL (ref 8.7–10.3)
Chloride: 106 mmol/L (ref 96–106)
Creatinine, Ser: 1.11 mg/dL — ABNORMAL HIGH (ref 0.57–1.00)
GFR calc Af Amer: 58 mL/min/{1.73_m2} — ABNORMAL LOW (ref >59)
GFR calc non Af Amer: 50 mL/min/{1.73_m2} — ABNORMAL LOW (ref >59)
Glucose: 71 mg/dL (ref 65–99)
Potassium: 4.4 mmol/L (ref 3.5–5.2)
Sodium: 144 mmol/L (ref 134–144)

## 2018-12-06 ENCOUNTER — Other Ambulatory Visit: Payer: Self-pay | Admitting: Family Medicine

## 2018-12-10 ENCOUNTER — Other Ambulatory Visit: Payer: Self-pay | Admitting: Physician Assistant

## 2018-12-22 ENCOUNTER — Other Ambulatory Visit: Payer: Self-pay | Admitting: Cardiovascular Disease

## 2019-03-09 ENCOUNTER — Other Ambulatory Visit: Payer: Self-pay | Admitting: Cardiovascular Disease

## 2019-03-15 DIAGNOSIS — M1712 Unilateral primary osteoarthritis, left knee: Secondary | ICD-10-CM | POA: Diagnosis not present

## 2019-03-15 DIAGNOSIS — M25561 Pain in right knee: Secondary | ICD-10-CM | POA: Diagnosis not present

## 2019-03-15 DIAGNOSIS — M1711 Unilateral primary osteoarthritis, right knee: Secondary | ICD-10-CM | POA: Diagnosis not present

## 2019-03-15 DIAGNOSIS — M170 Bilateral primary osteoarthritis of knee: Secondary | ICD-10-CM | POA: Diagnosis not present

## 2019-03-15 DIAGNOSIS — M25562 Pain in left knee: Secondary | ICD-10-CM | POA: Diagnosis not present

## 2019-03-16 ENCOUNTER — Other Ambulatory Visit: Payer: Self-pay | Admitting: Physician Assistant

## 2019-03-28 ENCOUNTER — Other Ambulatory Visit: Payer: Self-pay | Admitting: Physician Assistant

## 2019-03-28 ENCOUNTER — Other Ambulatory Visit: Payer: Self-pay | Admitting: Family Medicine

## 2019-04-22 ENCOUNTER — Other Ambulatory Visit: Payer: Self-pay | Admitting: Family Medicine

## 2019-05-15 ENCOUNTER — Encounter: Payer: Self-pay | Admitting: Family Medicine

## 2019-05-15 ENCOUNTER — Other Ambulatory Visit: Payer: Self-pay

## 2019-05-15 ENCOUNTER — Ambulatory Visit (INDEPENDENT_AMBULATORY_CARE_PROVIDER_SITE_OTHER): Payer: Medicare Other | Admitting: Family Medicine

## 2019-05-15 VITALS — BP 130/86 | Temp 98.5°F

## 2019-05-15 DIAGNOSIS — R600 Localized edema: Secondary | ICD-10-CM | POA: Diagnosis not present

## 2019-05-15 DIAGNOSIS — I2510 Atherosclerotic heart disease of native coronary artery without angina pectoris: Secondary | ICD-10-CM

## 2019-05-15 DIAGNOSIS — M79604 Pain in right leg: Secondary | ICD-10-CM | POA: Diagnosis not present

## 2019-05-15 DIAGNOSIS — E790 Hyperuricemia without signs of inflammatory arthritis and tophaceous disease: Secondary | ICD-10-CM

## 2019-05-15 DIAGNOSIS — M79605 Pain in left leg: Secondary | ICD-10-CM

## 2019-05-15 DIAGNOSIS — I10 Essential (primary) hypertension: Secondary | ICD-10-CM | POA: Diagnosis not present

## 2019-05-15 NOTE — Assessment & Plan Note (Signed)
I do not think reported symptoms are caused by a gout exacerbation. We discussed side effects of furosemide. No changes in allopurinol 100 mg daily. Continue low purine diet.

## 2019-05-15 NOTE — Assessment & Plan Note (Signed)
BP adequately controlled. No changes in current management. Low-salt diet recommended. Continue monitoring BP periodically.

## 2019-05-15 NOTE — Progress Notes (Signed)
Virtual Visit via Video Note   I connected with Tracy [name] on 05/15/19 at  2:30 PM EDT by a video enabled telemedicine application and verified that I am speaking with the correct person using two identifiers.  Location patient: home Location provider:work office Persons participating in the virtual visit: patient, provider  I discussed the limitations of evaluation and management by telemedicine and the availability of in person appointments. The patient expressed understanding and agreed to proceed.  Chief Complaint  Patient presents with  . Foot Pain     Swelling of feet/throbbing//tes    HPI: Complaining about 2 weeks of lower extremity edema, which seems to be worse at the end of the day. Alleviated by lower extremity elevation. She also has some soreness that starts after being up for a few hours and a "little" redness. She thinks this is a gout exacerbation. Pain alleviated by rest and edema by elevation.  She has history of gout, currently she is on allopurinol 100 mg daily.  She denies any recent trauma or unusual physical activity. She states that he has happened in the past intermittently. She has not tried OTC medications.  Negative for chest pain, palpitation, dyspnea, orthopnea, or PND. History of CKD, she has not noted gross hematuria, foam in urine, or decreased urine output.  Lab Results  Component Value Date   CREATININE 1.11 (H) 12/03/2018   BUN 25 12/03/2018   NA 144 12/03/2018   K 4.4 12/03/2018   CL 106 12/03/2018   CO2 22 12/03/2018   HTN, currently she is on losartan 50 mg daily. She also takes furosemide 40 mg daily, she has taken it twice a few times to help with lower extremity edema. Denies severe/frequent headache, visual changes, chest pain, dyspnea, palpitation, claudication, or focal weakness.  Denies recent surgery, long travel, or estrogen use.  ROS: See pertinent positives and negatives per HPI.  Past Medical History:  Diagnosis  Date  . Allergic rhinitis   . CAD (coronary artery disease)    1/19 PCI/DES to [place] for ISR, normal EF.   Marland Kitchen CHF (congestive heart failure) ([place])   . Gout   . Headache(784.0)   . Hyperlipidemia   . Hypertension   . Low back pain   . Menopausal syndrome   . Myocardial infarct ([place]) 2007   hx of  . Overactive bladder     Past Surgical History:  Procedure Laterality Date  . ANGIOPLASTY     stent 2007  . CARDIAC CATHETERIZATION    . COLONOSCOPY    . CORONARY STENT INTERVENTION N/A 11/06/2017   Procedure: CORONARY STENT INTERVENTION;  Surgeon: [name], MD;  Location: [facility];  Service: Cardiovascular;  Laterality: N/A;  . LEFT HEART CATH AND CORONARY ANGIOGRAPHY N/A 11/06/2017   Procedure: LEFT HEART CATH AND CORONARY ANGIOGRAPHY;  Surgeon: [name], MD;  Location: [facility] [facility];  Service: Cardiovascular;  Laterality: N/A;  . TONSILLECTOMY      Family History  Adopted: Yes  Problem Relation Age of Onset  . Colon cancer Mother 24  . Other Other        patient is adopted  . Stomach cancer Neg Hx     Social History   Socioeconomic History  . Marital status: Married    Spouse name: Not on file  . Number of children: Not on file  . Years of education: Not on file  . Highest education level: Not on file  Occupational History  . Occupation:  direct sales [employer]  Social Needs  . Financial resource strain: Not on file  . Food insecurity    Worry: Not on file    Inability: Not on file  . Transportation needs    Medical: No    Non-medical: No  Tobacco Use  . Smoking status: Former Smoker    Packs/day: 0.20    Years: 50.00    Pack years: 10.00    Quit date: 07/01/2012    Years since quitting: 6.8  . Smokeless tobacco: Never Used  . Tobacco comment: completely quit May of 2018; period of years she did not smoke   Substance and Sexual Activity  . Alcohol use: No  . Drug use: No  . Sexual activity: Not on file  Lifestyle  .  Physical activity    Days per week: 0 days    Minutes per session: 0 min  . Stress: Only a little  Relationships  . Social Herbalist on phone: Not on file    Gets together: Not on file    Attends religious service: Not on file    Active member of club or organization: Not on file    Attends meetings of clubs or organizations: Not on file    Relationship status: Not on file  . Intimate partner violence    Fear of current or ex partner: Not on file    Emotionally abused: Not on file    Physically abused: Not on file    Forced sexual activity: Not on file  Other Topics Concern  . Not on file  Social History Narrative  . Not on file     Current Outpatient Medications:  .  albuterol (PROVENTIL HFA;VENTOLIN HFA) 108 (90 Base) MCG/ACT inhaler, Inhale 2 puffs into the lungs every 4 (four) hours as needed for wheezing or shortness of breath., Disp: 1 Inhaler, Rfl: 5 .  allopurinol (ZYLOPRIM) 100 MG tablet, TAKE 1 TABLET BY MOUTH EVERY DAY, Disp: 30 tablet, Rfl: 0 .  ALPRAZolam (XANAX) 0.25 MG tablet, TAKE 1 TABLET TWICE A DAY AS NEEDED FOR ANXIETY, Disp: 30 tablet, Rfl: 0 .  aspirin EC 81 MG tablet, Take 81 mg by mouth daily., Disp: , Rfl:  .  atorvastatin (LIPITOR) 40 MG tablet, TAKE 1 TABLET DAILY BY MOUTH. PLEASE KEEP UPCOMING APPT FOR FUTURE REFILLS. THANKS, Disp: 90 tablet, Rfl: 3 .  calcium carbonate (CALCIUM 600) 600 MG TABS tablet, Take 600 mg by mouth See admin instructions. Once every 2 weeks, Disp: , Rfl:  .  cetirizine (ZYRTEC) 10 MG tablet, Take 10 mg by mouth daily., Disp: , Rfl:  .  Cholecalciferol (VITAMIN D3 PO), Take 1,000 Units by mouth daily., Disp: , Rfl:  .  clopidogrel (PLAVIX) 75 MG tablet, TAKE 1 TABLET (75 MG TOTAL) BY MOUTH DAILY WITH BREAKFAST., Disp: 90 tablet, Rfl: 1 .  Coenzyme Q10 (CO Q 10 PO), Take 300 mg by mouth See admin instructions. Once every 2 weeks., Disp: , Rfl:  .  Cyanocobalamin (VITAMIN B-12) 5000 MCG SUBL, Place 10,000 mcg under the  tongue daily. , Disp: , Rfl:  .  diclofenac sodium (VOLTAREN) 1 % GEL, Apply 4 g topically 4 (four) times daily., Disp: 4 Tube, Rfl: 3 .  furosemide (LASIX) 40 MG tablet, TAKE 1 TABLET BY MOUTH EVERY DAY, Disp: 90 tablet, Rfl: 3 .  Garlic 6010 MG CAPS, Take by mouth daily., Disp: , Rfl:  .  HYDROcodone-acetaminophen (NORCO/VICODIN) 5-325 MG tablet, Take 1  tablet by mouth daily as needed for moderate pain., Disp: 30 tablet, Rfl: 0 .  KLOR-CON M20 20 MEQ tablet, TAKE 1 TABLET (20 MEQ TOTAL) DAILY BY MOUTH., Disp: 90 tablet, Rfl: 3 .  losartan (COZAAR) 100 MG tablet, TAKE 1 TABLET BY MOUTH EVERY DAY, Disp: 90 tablet, Rfl: 3 .  metoprolol tartrate (LOPRESSOR) 100 MG tablet, TAKE 1 TABLET (100MG ) BY MOUTH IN THE MORNING AND ONE-HALF TABLET (50MG ) AT BEDTIME, Disp: 135 tablet, Rfl: 1 .  Misc Natural Products (OSTEO BI-FLEX ADV JOINT SHIELD PO), Take by mouth., Disp: , Rfl:  .  Misc Natural Products (TART CHERRY ADVANCED) CAPS, Take 1,000 capsules by mouth daily. , Disp: , Rfl:  .  nitroGLYCERIN (NITROSTAT) 0.4 MG SL tablet, PLACE 1 TABLET (0.4 MG TOTAL) UNDER THE TONGUE EVERY 5 (FIVE) MINUTES AS NEEDED FOR CHEST PAIN., Disp: 25 tablet, Rfl: 3 .  NON FORMULARY, 83.3 mg daily., Disp: , Rfl:  .  pantoprazole (PROTONIX) 20 MG tablet, Take 1 tablet (20 mg total) by mouth daily., Disp: 90 tablet, Rfl: 3 .  umeclidinium-vilanterol (ANORO ELLIPTA) 62.5-25 MCG/INH AEPB, Inhale 1 puff into the lungs daily., Disp: 60 each, Rfl: 3 .  vitamin E 200 UNIT capsule, Take 200 Units by mouth as needed. , Disp: , Rfl:   EXAM:  VITALS per patient if applicable:BP 867/67   Temp 98.5 F (36.9 C)   GENERAL: alert, oriented, appears well and in no acute distress  HEENT: atraumatic, conjunctiva clear, no obvious facial abnormalities on inspection.  NECK: normal movements of the head and neck  LUNGS: on inspection no signs of respiratory distress, breathing rate appears normal, no obvious gross SOB, gasping or  wheezing  CV: no obvious cyanosis  Tracy: moves all visible extremities without noticeable abnormality. I do not appreciate erythema or skin lesions.  Pedal and lower extremity edema, bilateral.  PSYCH/NEURO: pleasant and cooperative, no obvious depression. + Anxious, speech and thought processing grossly intact  ASSESSMENT AND PLAN:  Discussed the following assessment and plan:  Bilateral lower extremity edema - Plan: Hx does not suggest a serious process. She has Hx of LE,pedal edema listed in problem list.  Continue Furosemide 40 mg daily,side effects discussed. LE elevation and compression stocking recommended. Instructed about warning signs.  Pain in both lower extremities - Plan: Possible etiologies discussed. I think it is related to above problem.  Monitor for new symptoms. Instructed about warning signs.  Hyperuricemia I do not think reported symptoms are caused by a gout exacerbation. We discussed side effects of furosemide. No changes in allopurinol 100 mg daily. Continue low purine diet.  Essential hypertension BP adequately controlled. No changes in current management. Low-salt diet recommended. Continue monitoring BP periodically.   I discussed the assessment and treatment plan with the patient.  She was provided an opportunity to ask questions and all were answered.  She agreed with the plan and demonstrated an understanding of the instructions.   The patient was advised to call back or seek an in-person evaluation if the symptoms worsen or if the condition fails to improve as anticipated.  Return if symptoms worsen or fail to improve.    Fatimah Sundquist [name], MD

## 2019-05-23 ENCOUNTER — Other Ambulatory Visit: Payer: Self-pay | Admitting: Family Medicine

## 2019-05-29 ENCOUNTER — Ambulatory Visit: Payer: Medicare Other

## 2019-06-13 ENCOUNTER — Ambulatory Visit (INDEPENDENT_AMBULATORY_CARE_PROVIDER_SITE_OTHER): Payer: Medicare Other

## 2019-06-13 ENCOUNTER — Ambulatory Visit (INDEPENDENT_AMBULATORY_CARE_PROVIDER_SITE_OTHER): Payer: Medicare Other | Admitting: Emergency Medicine

## 2019-06-13 ENCOUNTER — Encounter: Payer: Self-pay | Admitting: Emergency Medicine

## 2019-06-13 ENCOUNTER — Other Ambulatory Visit: Payer: Self-pay

## 2019-06-13 VITALS — BP 126/88 | HR 104 | Ht 65.0 in | Wt 225.0 lb

## 2019-06-13 DIAGNOSIS — R0600 Dyspnea, unspecified: Secondary | ICD-10-CM

## 2019-06-13 DIAGNOSIS — J449 Chronic obstructive pulmonary disease, unspecified: Secondary | ICD-10-CM | POA: Diagnosis not present

## 2019-06-13 DIAGNOSIS — J301 Allergic rhinitis due to pollen: Secondary | ICD-10-CM | POA: Diagnosis not present

## 2019-06-13 DIAGNOSIS — R918 Other nonspecific abnormal finding of lung field: Secondary | ICD-10-CM | POA: Diagnosis not present

## 2019-06-13 DIAGNOSIS — Z23 Encounter for immunization: Secondary | ICD-10-CM

## 2019-06-13 DIAGNOSIS — I5042 Chronic combined systolic (congestive) and diastolic (congestive) heart failure: Secondary | ICD-10-CM

## 2019-06-13 DIAGNOSIS — K219 Gastro-esophageal reflux disease without esophagitis: Secondary | ICD-10-CM

## 2019-06-13 DIAGNOSIS — I2510 Atherosclerotic heart disease of native coronary artery without angina pectoris: Secondary | ICD-10-CM | POA: Diagnosis not present

## 2019-06-13 NOTE — Patient Instructions (Signed)
Please continue Anoro once daily as you have been taking it.  Rinse and gargle after using. You can keep your albuterol inhaler available to use 2 puffs up to every 4 hours if needed for shortness of breath, chest tightness, wheezing. We will write a prescription for albuterol that you can use through your nebulizer machine.  You can use 1 nebulizer up to every 4 hours for shortness of breath, chest tightness, wheezing.  This is another delivery system of the same medication. Flu shot today Please take your Lasix 40 mg twice a day for the next 2 days. Chest x-ray today. Continue Zyrtec once daily. Please start fluticasone nasal spray, 2 sprays each nostril once daily to see if this helps with your sinus congestion and cough. Follow with Dr Lamonte Sakai in 4 months or sooner if you have any problems.

## 2019-06-13 NOTE — Assessment & Plan Note (Signed)
>>  ASSESSMENT AND PLAN FOR CHRONIC COMBINED SYSTOLIC AND DIASTOLIC HEART FAILURE ([place]) WRITTEN ON 06/13/2019  9:36 AM BY BYRUM, Rose Fillers, MD  She has crackles on exam, 4 pounds of weight gain.  We will perform a chest x-ray today, temporarily increase her Lasix to twice a day for 2 days.  Follow for improvement in her symptoms.

## 2019-06-13 NOTE — Assessment & Plan Note (Signed)
Increased sinus and chest mucus.  She is on Zyrtec.  I will add fluticasone nasal spray today.

## 2019-06-13 NOTE — Assessment & Plan Note (Signed)
She is having some increased dyspnea in the hot weather, humid weather which is probably to be expected.  Slight increase in her chest mucus.  I do not hear any wheezing on exam, do not believe she has an acute exacerbation at this time.  Continue Anoro.  She has albuterol HFA, I will give her albuterol nebs as well

## 2019-06-13 NOTE — Progress Notes (Signed)
Subjective:    Patient ID: Tracy [name], female    DOB: [birth year], 73 y.o.   MRN: [id number]  Shortness of Breath Associated symptoms include headaches. Pertinent negatives include no ear pain, fever, leg swelling, rash, rhinorrhea, sore throat, vomiting or wheezing.    ROV 08/02/18 --patient follows up today for her history of COPD (mixed obstruction and restriction), coronary disease with hypertension and diastolic CHF.  I saw her 2 months ago and we decided to try starting long-acting bronchodilator for her contribution from COPD.  We tried Spiriva Respimat.  She reports that she believes that it did help her breathing and her functional capacity. It is too costly, her formulary apparently prefers Incruse (and trelegy, bevespi). She had her flu shot. She is up to date on PNA shot.   ROV 10/29/18 --73 year old woman with a history of mixed obstruction and restriction, COPD.  She also has coronary disease, hypertension with diastolic dysfunction.  We have had difficulty getting her on a stable inhaled regimen due to cost.  She was seen here in December for what sounded like an acute exacerbation of COPD and possibly also some contribution of her diastolic CHF.  She was only marginally compliant with both her diuretics and her Incruse.  She was changed to Anoro - ran out 1 week ago, did not refill it. High deductible for a Tier 3 med. She has been dealing with a URI, improving but still symptomatic. She uses albuterol about few times a week. Needs it when walking, is able to shop. Uses a walker. Zyrtec every day, protonix 20 qd.   ROV 06/13/2019 --73 year old woman with coronary disease, hypertension with associated diastolic dysfunction.  We follow her for mixed obstruction and restriction, COPD, allergic rhinitis, GERD.  We had been managing her with Anoro but there have been significant barriers due to cost.  She reports today that she has tolerated Anoro, benefits. She has albuterol, uses it  approximately 1-2x. She has more difficulty breathing in the hot humid air. She has some increased head and chest mucous, chest congestion for the last week. On zyrtec. She has gained about 4 lbs since last time, increased LE edema. On lasix 40 qd.    Review of Systems  Constitutional: Positive for unexpected weight change. Negative for fever.  HENT: Negative for congestion, dental problem, ear pain, nosebleeds, postnasal drip, rhinorrhea, sinus pressure, sneezing, sore throat and trouble swallowing.   Eyes: Negative for redness and itching.  Respiratory: Positive for cough and shortness of breath. Negative for chest tightness and wheezing.   Cardiovascular: Negative for palpitations and leg swelling.  Gastrointestinal: Negative for nausea and vomiting.  Genitourinary: Negative for dysuria.  Musculoskeletal: Positive for joint swelling.  Skin: Negative for rash.  Neurological: Positive for headaches.  Hematological: Does not bruise/bleed easily.  Psychiatric/Behavioral: Negative for dysphoric mood. The patient is not nervous/anxious.        Objective:   Physical Exam Vitals:   06/13/19 0914  BP: 126/88  Pulse: (!) 104  SpO2: 96%  Weight: 225 lb (102.1 kg)  Height: 5\' 5"  (1.651 m)   Gen: Pleasant, overwt, in no distress,  normal affect, occasionally coughing  ENT: No lesions,  mouth clear,  oropharynx clear, no postnasal drip  Neck: No JVD, no stridor  Lungs: No use of accessory muscles, slightly distant, scattered insp crackles today  Cardiovascular: RRR, heart sounds normal, no murmur or gallops, 2+ L:E peripheral edema  Musculoskeletal: No deformities, no cyanosis or clubbing  Neuro:  alert, non focal  Skin: Warm, no lesions or rash     Assessment & Plan:  COPD (chronic obstructive pulmonary disease) ([place]) She is having some increased dyspnea in the hot weather, humid weather which is probably to be expected.  Slight increase in her chest mucus.  I do not hear any  wheezing on exam, do not believe she has an acute exacerbation at this time.  Continue Anoro.  She has albuterol HFA, I will give her albuterol nebs as well  Allergic rhinitis Increased sinus and chest mucus.  She is on Zyrtec.  I will add fluticasone nasal spray today.  GERD Continue same regimen  Chronic combined systolic and diastolic heart failure ([place]) She has crackles on exam, 4 pounds of weight gain.  We will perform a chest x-ray today, temporarily increase her Lasix to twice a day for 2 days.  Follow for improvement in her symptoms.  Baltazar Apo, MD, PhD 06/13/2019, 9:37 AM [facility] [phone] or if no answer [phone]

## 2019-06-13 NOTE — Assessment & Plan Note (Signed)
Continue same regimen 

## 2019-06-13 NOTE — Assessment & Plan Note (Signed)
She has crackles on exam, 4 pounds of weight gain.  We will perform a chest x-ray today, temporarily increase her Lasix to twice a day for 2 days.  Follow for improvement in her symptoms.

## 2019-06-14 ENCOUNTER — Other Ambulatory Visit: Payer: Self-pay | Admitting: *Deleted

## 2019-06-14 MED ORDER — ALBUTEROL SULFATE (2.5 MG/3ML) 0.083% IN NEBU: 2.5000 mg | INHALATION_SOLUTION | Freq: Four times a day (QID) | RESPIRATORY_TRACT | 5 refills | Status: DC | PRN

## 2019-06-14 MED ORDER — ANORO ELLIPTA 62.5-25 MCG/INH IN AEPB: 1.0000 | INHALATION_SPRAY | Freq: Every day | RESPIRATORY_TRACT | 3 refills | Status: DC

## 2019-06-14 MED ORDER — FLUTICASONE PROPIONATE 50 MCG/ACT NA SUSP: 2.0000 | Freq: Every day | NASAL | 5 refills | Status: DC

## 2019-06-15 ENCOUNTER — Other Ambulatory Visit: Payer: Self-pay | Admitting: Family Medicine

## 2019-06-19 ENCOUNTER — Telehealth: Payer: Self-pay | Admitting: Emergency Medicine

## 2019-06-19 NOTE — Telephone Encounter (Signed)
Returned call to patient She wanted to know if she was to have a f/u cxr? Notified we would find out and call her back with info.

## 2019-06-19 NOTE — Telephone Encounter (Signed)
Spoke with pt. I have gone over the result note from her most recent CXR with her, again. I am not sure why her flu shot did not show up in [place]. I have manually put in the date to reflect her having her flu shot. Nothing further was needed.

## 2019-07-09 ENCOUNTER — Other Ambulatory Visit: Payer: Self-pay | Admitting: Family Medicine

## 2019-07-09 NOTE — H&P (View-Only) (Signed)
Cardiology Office Note:    Date:  07/10/2019   ID:  Tracy [name], DOB 04/11/1946, MRN [id number]  PCP:  [name], [name], MD  Cardiologist:  Tracy Mocha, MD   Electrophysiologist:  None   Referring MD: [name], [name], MD   Chief Complaint  Patient presents with   Shortness of Breath   Chest Pain    History of Present Illness:    Tracy [name] is a 73 y.o. female with:  Coronary artery disease   S/p BMS to LCx in 2007  S/p DES to LCx in [id number]  Chronic diastolic CHF  Echocardiogram 12/18: EF 50-55, Gr 1 DD  Chronic kidney disease 3  COPD  Hypertension   Hyperlipidemia    [name] was last seen by Tracy Barrios, PA-C in 10/2018.  She returns today for evaluation of chest pain and shortness of breath.  She has had chest discomfort over the past 6 months that seems to occur more with positional changes.  This is a shooting pain in her left chest that radiates from around her clavicle to her breast.  She has noted it with exertion at times.  She has also been able to exert herself without symptoms.  She has taken nitroglycerin in the past with relief.  She has chronic shortness of breath related to COPD.  It seems that her breathing has gotten worse over the past couple of months.  She has also noticed significant left leg swelling over the past couple of months.  Her primary care doctor and her pulmonologist both increased her Lasix for a couple of days.  She had minimal improvement with this.  She sleeps on 2 pillows.  She has had paroxysmal nocturnal dyspnea at times.  She has not had syncope.  Of note, she saw Dr. Lamonte [name] in August.  A chest x-ray at that time did suggest mild interstitial edema.  Prior CV studies:   The following studies were reviewed today:  Cardiac catheterization 11/06/2017 Conclusions: 1. Mild to moderate coronary artery disease involving LAD and RCA, not significantly change since 2011/20112. 2. 50-60% in-stent restenosis in  proximal portion of mid LCx stent, corresponding to area of ischemia on recent myocardial perfusion stress test. 3. Mildly elevated left ventricular filling pressure.. 4. Successful PCI to proximal/mid LCx with placement of a Synergy 3.0 x 20 mm DES with 0% residual stenosis and TIMI-3 flow.  Recommendations: 1. Dual antiplatelet therapy with aspirin and clopidogrel for at least 6 months. 2. Aggressive secondary prevention. 3. Anticipate same-day discharge if patient remains hemodynamically stable and asymptomatic.    Myoview 10/13/17 EF 51, There is a medium size, moderate severity reversible defect in the basal and mid inferior and inferolateral walls consistent with ischemia; Intermediate Risk   Echocardiogram 09/29/17 Mild LVH, EF 50-55, no RWMA, Gr 1 DD  Echocardiogram 10/04/16 EF 45-50  Past Medical History:  Diagnosis Date   Allergic rhinitis    CAD (coronary artery disease)    1/19 PCI/DES to [place] for ISR, normal EF.    CHF (congestive heart failure) ([place])    Gout    Headache(784.0)    Hyperlipidemia    Hypertension    Low back pain    Menopausal syndrome    Myocardial infarct [facility]) 2007   hx of   Overactive bladder    Surgical Hx: The patient  has a past surgical history that includes Tonsillectomy; Angioplasty; Colonoscopy; LEFT HEART CATH AND CORONARY ANGIOGRAPHY (N/A, 11/06/2017); CORONARY STENT INTERVENTION (N/A, 11/06/2017);  and Cardiac catheterization.   Current Medications: Current Meds  Medication Sig   albuterol (PROVENTIL HFA;VENTOLIN HFA) 108 (90 Base) MCG/ACT inhaler Inhale 2 puffs into the lungs every 4 (four) hours as needed for wheezing or shortness of breath.   albuterol (PROVENTIL) (2.5 MG/3ML) 0.083% nebulizer solution Take 3 mLs (2.5 mg total) by nebulization every 6 (six) hours as needed for wheezing or shortness of breath.   allopurinol (ZYLOPRIM) 100 MG tablet TAKE 1 TABLET BY MOUTH EVERY DAY   ALPRAZolam (XANAX) 0.25 MG tablet  TAKE 1 TABLET TWICE A DAY AS NEEDED FOR ANXIETY   aspirin EC 81 MG tablet Take 81 mg by mouth daily.   atorvastatin (LIPITOR) 40 MG tablet TAKE 1 TABLET DAILY BY MOUTH. PLEASE KEEP UPCOMING APPT FOR FUTURE REFILLS. THANKS   calcium carbonate (CALCIUM 600) 600 MG TABS tablet Take 600 mg by mouth See admin instructions. Once every 2 weeks   cetirizine (ZYRTEC) 10 MG tablet Take 10 mg by mouth daily.   Cholecalciferol (VITAMIN D3 PO) Take 1,000 Units by mouth daily.   clopidogrel (PLAVIX) 75 MG tablet TAKE 1 TABLET (75 MG TOTAL) BY MOUTH DAILY WITH BREAKFAST.   Coenzyme Q10 (CO Q 10 PO) Take 300 mg by mouth See admin instructions. Once every 2 weeks.   Cyanocobalamin (VITAMIN B-12) 5000 MCG SUBL Place 10,000 mcg under the tongue daily.    diclofenac sodium (VOLTAREN) 1 % GEL Apply 4 [name] topically 4 (four) times daily.   fluticasone (FLONASE) 50 MCG/ACT nasal spray Place 2 sprays into both nostrils daily.   furosemide (LASIX) 40 MG tablet Take 1 tablet (40 mg total) by mouth daily.   Garlic [id number] MG CAPS Take by mouth daily.   HYDROcodone-acetaminophen (NORCO/VICODIN) 5-325 MG tablet Take 1 tablet by mouth daily as needed for moderate pain.   losartan (COZAAR) 100 MG tablet TAKE 1 TABLET BY MOUTH EVERY DAY   metoprolol tartrate (LOPRESSOR) 100 MG tablet TAKE 1 TABLET (100MG ) BY MOUTH IN THE MORNING AND ONE-HALF TABLET (50MG ) AT BEDTIME   Misc Natural Products (TART CHERRY ADVANCED) CAPS Take 1,000 capsules by mouth daily.    nitroGLYCERIN (NITROSTAT) 0.4 MG SL tablet PLACE 1 TABLET (0.4 MG TOTAL) UNDER THE TONGUE EVERY 5 (FIVE) MINUTES AS NEEDED FOR CHEST PAIN.   NON FORMULARY 83.3 mg daily.   OVER THE COUNTER MEDICATION Take 1 tablet by mouth daily. Horse chestnut extract   pantoprazole (PROTONIX) 20 MG tablet Take 1 tablet (20 mg total) by mouth daily.   potassium chloride SA (KLOR-CON M20) 20 MEQ tablet TAKE 1 TABLET (20 MEQ TOTAL) DAILY BY MOUTH.   umeclidinium-vilanterol  (ANORO ELLIPTA) 62.5-25 MCG/INH AEPB Inhale 1 puff into the lungs daily.   vitamin E 200 UNIT capsule Take 200 Units by mouth as needed.    [DISCONTINUED] furosemide (LASIX) 40 MG tablet TAKE 1 TABLET BY MOUTH EVERY DAY   [DISCONTINUED] KLOR-CON M20 20 MEQ tablet TAKE 1 TABLET (20 MEQ TOTAL) DAILY BY MOUTH.     Allergies:   Erythromycin and Sulfamethoxazole   Social History   Tobacco Use   Smoking status: Former Smoker    Packs/day: 0.20    Years: 50.00    Pack years: 10.00    Quit date: 07/01/2012    Years since quitting: 7.0   Smokeless tobacco: Never Used   Tobacco comment: completely quit May of 2018; period of years she did not smoke   Substance Use Topics   Alcohol use: No   Drug use:  No     Family Hx: The patient's family history includes Colon cancer (age of onset: 61) in her mother; Other in an other family member. There is no history of Stomach cancer. She was adopted.  ROS:   Please see the history of present illness.    Review of Systems  Constitution: Negative for fever.  Respiratory: Negative for cough.   Gastrointestinal: Positive for diarrhea (resolved now). Negative for hematochezia and melena.  Genitourinary: Negative for hematuria.   All other systems reviewed and are negative.   EKGs/Labs/Other Test Reviewed:    EKG:  EKG is  ordered today.  The ekg ordered today demonstrates normal sinus rhythm, heart rate 94, normal axis, no acute ST-T wave changes, QTC 482, PVCs  Recent Labs: 09/24/2018: Hemoglobin 11.8; Platelets 237.0 10/30/2018: NT-Pro BNP 271 12/03/2018: BUN 25; Creatinine, Ser 1.11; Potassium 4.4; Sodium 144   Recent Lipid Panel Lab Results  Component Value Date/Time   CHOL 154 09/13/2016 07:38 AM   TRIG 175 (H) 09/13/2016 07:38 AM   HDL 31 (L) 09/13/2016 07:38 AM   CHOLHDL 5.0 (H) 09/13/2016 07:38 AM   LDLCALC 88 09/13/2016 07:38 AM   LDLDIRECT 111.0 01/05/2015 10:19 AM    Physical Exam:    VS:  BP (!) 128/92    Pulse 94     Ht 5\' 5"  (1.651 m)    Wt 231 lb (104.8 kg)    BMI 38.44 kg/m     Wt Readings from Last 3 Encounters:  07/10/19 231 lb (104.8 kg)  06/13/19 225 lb (102.1 kg)  10/30/18 222 lb 6.4 oz (100.9 kg)     Physical Exam  Constitutional: She is oriented to person, place, and time. She appears well-developed and well-nourished. No distress.  HENT:  Head: Normocephalic and atraumatic.  Eyes: No scleral icterus.  Neck: No JVD present. No thyromegaly present.  Cardiovascular: Normal rate and regular rhythm.  No murmur heard. Pulmonary/Chest: Effort normal. She has no rales.  Abdominal: She exhibits distension.  Musculoskeletal:        General: Edema (2+ left leg edema w/ chronic changes; tr-1+ right leg edema ) present.  Lymphadenopathy:    She has no cervical adenopathy.  Neurological: She is alert and oriented to person, place, and time.  Skin: Skin is warm and dry.  Psychiatric: She has a normal mood and affect.    ASSESSMENT & PLAN:    1. Chronic diastolic CHF (congestive heart failure) ([place]) 2. Shortness of breath  She notes worsening shortness of breath.  Her shortness of breath is likely multifactorial and related to COPD, CAD and heart failure as well as obesity.  Currently, she seems to be volume overloaded which is exacerbating her shortness of breath.  -Increase Lasix to 40 mg twice daily x1 week, then resume 40 mg daily  -Increase K+ to 20 mEq twice daily x1 week, then resume 20 mEq once daily   -BMET, BNP, CBC today  -Repeat BMP in 1 week  -Obtain follow-up echocardiogram  -Close follow-up with Dr. Burt Knack, me or Tracy Barrios, PA-C in 2 weeks  3. Coronary artery disease without angina pectoris 4. Chest pain  Her chest pain is somewhat atypical for ischemia.  She had an abnormal Myoview prior to her PCI in January 2019.  ECG today does not demonstrate any acute ST-T wave changes.  She does have PVCs noted.  Proceed with echocardiogram as noted above.  We will start her  evaluation with stress testing.  If this is  abnormal, she will need cardiac catheterization.  -Continue aspirin, atorvastatin, clopidogrel, metoprolol  -Arrange Lexiscan Myoview  -Continue as needed nitroglycerin  -Follow-up 2 weeks  5.  Leg swelling Left leg is significantly larger than her right.  She has not taken any prolonged trips or been recently hospitalized.  However, given the discrepancy in size, I think we should rule out DVT.  -Arrange left leg venous Doppler  6. PVCs Obtain echocardiogram, BMET, magnesium.  7. CKD (chronic kidney disease), stage III ([place]) Obtain BMET today and repeat BMET in 1 week after increasing Lasix.  8. Essential hypertension Diastolic somewhat elevated today.  Adjust Lasix as noted.  Continue current dose of metoprolol, losartan.  Continue to monitor.  9. Pure hypercholesterolemia Continue statin therapy.   Dispo:  Return in about 2 weeks (around 07/24/2019) for Close Follow Up, w/ Dr. Burt Knack, or Richardson Dopp, PA-C or Tracy Barrios, PA-C.   Medication Adjustments/Labs and Tests Ordered: Current medicines are reviewed at length with the patient today.  Concerns regarding medicines are outlined above.  Tests Ordered: Orders Placed This Encounter  Procedures   Basic Metabolic Panel (BMET)   Magnesium   CBC   Pro b natriuretic peptide (BNP)   Basic Metabolic Panel (BMET)   MYOCARDIAL PERFUSION IMAGING   EKG 12-Lead   ECHOCARDIOGRAM COMPLETE   VAS [place] LOWER EXTREMITY VENOUS (DVT)   Medication Changes: Meds ordered this encounter  Medications   furosemide (LASIX) 40 MG tablet    Sig: Take 1 tablet (40 mg total) by mouth daily.    Dispense:  90 tablet    Refill:  3   potassium chloride SA (KLOR-CON M20) 20 MEQ tablet    Sig: TAKE 1 TABLET (20 MEQ TOTAL) DAILY BY MOUTH.    Dispense:  90 tablet    Refill:  3    Signed, Richardson Dopp, PA-C  07/10/2019 [facility] [address],  [place], [place]  [address] Phone: [phone]; Fax: [phone]   ADDENDUM 07/19/2019 11:25 AM  Myoview 07/18/2019:  Nuclear stress EF: 32%.  There was no ST segment deviation noted during stress.  No T wave inversion was noted during stress.  Defect 1: There is a large defect of moderate severity present in the basal inferior, basal inferolateral, basal anterolateral, mid inferolateral, mid anterolateral and apical lateral location.  Defect 2: There is a small defect of moderate severity present in the basal anterior, basal anteroseptal and basal inferoseptal location.  Findings consistent with prior myocardial infarction with peri-infarct ischemia.  This is a high risk study.  The left ventricular ejection fraction is moderately decreased (30-44%).   There is a large moderate severity defect in the lateral walls extending to basal inferior wall, consistent with infarct. There is a second defect at the base from the anterior wall to septum more consistent with ischemia. EF is significantly reduced, and all walls except septal walls appear hypokinetic at rest. Defects slightly worse than prior study.  I reviewed her stress test with Dr. Burt Knack.  We both agreed that the patient should undergo cardiac catheterization for further evaluation.  I contacted patient by phone and discussed her results with her.  Risks and benefits of cardiac catheterization have been discussed with the patient.  These include bleeding, infection, kidney damage, stroke, heart attack, death.  The patient understands these risks and is willing to proceed.   PLAN:  1.  Arrange cardiac catheterization next week. 2.  Patient  knows to go to the emergency room if her symptoms should worsen. 3.  2-week follow-up post cardiac catheterization.  Signed,  Richardson Dopp, PA-C 07/19/2019 11:27 AM

## 2019-07-09 NOTE — Progress Notes (Addendum)
Cardiology Office Note:    Date:  07/10/2019   ID:  Tracy [name], DOB 1946/03/29, MRN [id number]  PCP:  [name], [name], MD  Cardiologist:  Sherren Mocha, MD   Electrophysiologist:  None   Referring MD: [name], [name], MD   Chief Complaint  Patient presents with   Shortness of Breath   Chest Pain    History of Present Illness:    Tracy [name] is a 73 y.o. female with:  Coronary artery disease   S/p BMS to LCx in 2007  S/p DES to LCx in [id number]  Chronic diastolic CHF  Echocardiogram 12/18: EF 50-55, Gr 1 DD  Chronic kidney disease 3  COPD  Hypertension   Hyperlipidemia    Tracy [name] was last seen by Ermalinda Barrios, PA-C in 10/2018.  She returns today for evaluation of chest pain and shortness of breath.  She has had chest discomfort over the past 6 months that seems to occur more with positional changes.  This is a shooting pain in her left chest that radiates from around her clavicle to her breast.  She has noted it with exertion at times.  She has also been able to exert herself without symptoms.  She has taken nitroglycerin in the past with relief.  She has chronic shortness of breath related to COPD.  It seems that her breathing has gotten worse over the past couple of months.  She has also noticed significant left leg swelling over the past couple of months.  Her primary care doctor and her pulmonologist both increased her Lasix for a couple of days.  She had minimal improvement with this.  She sleeps on 2 pillows.  She has had paroxysmal nocturnal dyspnea at times.  She has not had syncope.  Of note, she saw Dr. Lamonte Sakai in August.  A chest x-ray at that time did suggest mild interstitial edema.  Prior CV studies:   The following studies were reviewed today:  Cardiac catheterization 11/06/2017 Conclusions: 1. Mild to moderate coronary artery disease involving LAD and RCA, not significantly change since 2011/20112. 2. 50-60% in-stent restenosis in  proximal portion of mid LCx stent, corresponding to area of ischemia on recent myocardial perfusion stress test. 3. Mildly elevated left ventricular filling pressure.. 4. Successful PCI to proximal/mid LCx with placement of a Synergy 3.0 x 20 mm DES with 0% residual stenosis and TIMI-3 flow.  Recommendations: 1. Dual antiplatelet therapy with aspirin and clopidogrel for at least 6 months. 2. Aggressive secondary prevention. 3. Anticipate same-day discharge if patient remains hemodynamically stable and asymptomatic.    Myoview 10/13/17 EF 51, There is a medium size, moderate severity reversible defect in the basal and mid inferior and inferolateral walls consistent with ischemia; Intermediate Risk   Echocardiogram 09/29/17 Mild LVH, EF 50-55, no RWMA, Gr 1 DD  Echocardiogram 10/04/16 EF 45-50  Past Medical History:  Diagnosis Date   Allergic rhinitis    CAD (coronary artery disease)    1/19 PCI/DES to [place] for ISR, normal EF.    CHF (congestive heart failure) ([place])    Gout    Headache(784.0)    Hyperlipidemia    Hypertension    Low back pain    Menopausal syndrome    Myocardial infarct [facility]) 2007   hx of   Overactive bladder    Surgical Hx: The patient  has a past surgical history that includes Tonsillectomy; Angioplasty; Colonoscopy; LEFT HEART CATH AND CORONARY ANGIOGRAPHY (N/A, 11/06/2017); CORONARY STENT INTERVENTION (N/A, 11/06/2017);  and Cardiac catheterization.   Current Medications: Current Meds  Medication Sig   albuterol (PROVENTIL HFA;VENTOLIN HFA) 108 (90 Base) MCG/ACT inhaler Inhale 2 puffs into the lungs every 4 (four) hours as needed for wheezing or shortness of breath.   albuterol (PROVENTIL) (2.5 MG/3ML) 0.083% nebulizer solution Take 3 mLs (2.5 mg total) by nebulization every 6 (six) hours as needed for wheezing or shortness of breath.   allopurinol (ZYLOPRIM) 100 MG tablet TAKE 1 TABLET BY MOUTH EVERY DAY   ALPRAZolam (XANAX) 0.25 MG tablet  TAKE 1 TABLET TWICE A DAY AS NEEDED FOR ANXIETY   aspirin EC 81 MG tablet Take 81 mg by mouth daily.   atorvastatin (LIPITOR) 40 MG tablet TAKE 1 TABLET DAILY BY MOUTH. PLEASE KEEP UPCOMING APPT FOR FUTURE REFILLS. THANKS   calcium carbonate (CALCIUM 600) 600 MG TABS tablet Take 600 mg by mouth See admin instructions. Once every 2 weeks   cetirizine (ZYRTEC) 10 MG tablet Take 10 mg by mouth daily.   Cholecalciferol (VITAMIN D3 PO) Take 1,000 Units by mouth daily.   clopidogrel (PLAVIX) 75 MG tablet TAKE 1 TABLET (75 MG TOTAL) BY MOUTH DAILY WITH BREAKFAST.   Coenzyme Q10 (CO Q 10 PO) Take 300 mg by mouth See admin instructions. Once every 2 weeks.   Cyanocobalamin (VITAMIN B-12) 5000 MCG SUBL Place 10,000 mcg under the tongue daily.    diclofenac sodium (VOLTAREN) 1 % GEL Apply 4 g topically 4 (four) times daily.   fluticasone (FLONASE) 50 MCG/ACT nasal spray Place 2 sprays into both nostrils daily.   furosemide (LASIX) 40 MG tablet Take 1 tablet (40 mg total) by mouth daily.   Garlic [id number] MG CAPS Take by mouth daily.   HYDROcodone-acetaminophen (NORCO/VICODIN) 5-325 MG tablet Take 1 tablet by mouth daily as needed for moderate pain.   losartan (COZAAR) 100 MG tablet TAKE 1 TABLET BY MOUTH EVERY DAY   metoprolol tartrate (LOPRESSOR) 100 MG tablet TAKE 1 TABLET (100MG ) BY MOUTH IN THE MORNING AND ONE-HALF TABLET (50MG ) AT BEDTIME   Misc Natural Products (TART CHERRY ADVANCED) CAPS Take 1,000 capsules by mouth daily.    nitroGLYCERIN (NITROSTAT) 0.4 MG SL tablet PLACE 1 TABLET (0.4 MG TOTAL) UNDER THE TONGUE EVERY 5 (FIVE) MINUTES AS NEEDED FOR CHEST PAIN.   NON FORMULARY 83.3 mg daily.   OVER THE COUNTER MEDICATION Take 1 tablet by mouth daily. Horse chestnut extract   pantoprazole (PROTONIX) 20 MG tablet Take 1 tablet (20 mg total) by mouth daily.   potassium chloride SA (KLOR-CON M20) 20 MEQ tablet TAKE 1 TABLET (20 MEQ TOTAL) DAILY BY MOUTH.   umeclidinium-vilanterol  (ANORO ELLIPTA) 62.5-25 MCG/INH AEPB Inhale 1 puff into the lungs daily.   vitamin E 200 UNIT capsule Take 200 Units by mouth as needed.    [DISCONTINUED] furosemide (LASIX) 40 MG tablet TAKE 1 TABLET BY MOUTH EVERY DAY   [DISCONTINUED] KLOR-CON M20 20 MEQ tablet TAKE 1 TABLET (20 MEQ TOTAL) DAILY BY MOUTH.     Allergies:   Erythromycin and Sulfamethoxazole   Social History   Tobacco Use   Smoking status: Former Smoker    Packs/day: 0.20    Years: 50.00    Pack years: 10.00    Quit date: 07/01/2012    Years since quitting: 7.0   Smokeless tobacco: Never Used   Tobacco comment: completely quit May of 2018; period of years she did not smoke   Substance Use Topics   Alcohol use: No   Drug use:  No     Family Hx: The patient's family history includes Colon cancer (age of onset: 74) in her mother; Other in an other family member. There is no history of Stomach cancer. She was adopted.  ROS:   Please see the history of present illness.    Review of Systems  Constitution: Negative for fever.  Respiratory: Negative for cough.   Gastrointestinal: Positive for diarrhea (resolved now). Negative for hematochezia and melena.  Genitourinary: Negative for hematuria.   All other systems reviewed and are negative.   EKGs/Labs/Other Test Reviewed:    EKG:  EKG is  ordered today.  The ekg ordered today demonstrates normal sinus rhythm, heart rate 94, normal axis, no acute ST-T wave changes, QTC 482, PVCs  Recent Labs: 09/24/2018: Hemoglobin 11.8; Platelets 237.0 10/30/2018: NT-Pro BNP 271 12/03/2018: BUN 25; Creatinine, Ser 1.11; Potassium 4.4; Sodium 144   Recent Lipid Panel Lab Results  Component Value Date/Time   CHOL 154 09/13/2016 07:38 AM   TRIG 175 (H) 09/13/2016 07:38 AM   HDL 31 (L) 09/13/2016 07:38 AM   CHOLHDL 5.0 (H) 09/13/2016 07:38 AM   LDLCALC 88 09/13/2016 07:38 AM   LDLDIRECT 111.0 01/05/2015 10:19 AM    Physical Exam:    VS:  BP (!) 128/92    Pulse 94     Ht 5\' 5"  (1.651 m)    Wt 231 lb (104.8 kg)    BMI 38.44 kg/m     Wt Readings from Last 3 Encounters:  07/10/19 231 lb (104.8 kg)  06/13/19 225 lb (102.1 kg)  10/30/18 222 lb 6.4 oz (100.9 kg)     Physical Exam  Constitutional: She is oriented to person, place, and time. She appears well-developed and well-nourished. No distress.  HENT:  Head: Normocephalic and atraumatic.  Eyes: No scleral icterus.  Neck: No JVD present. No thyromegaly present.  Cardiovascular: Normal rate and regular rhythm.  No murmur heard. Pulmonary/Chest: Effort normal. She has no rales.  Abdominal: She exhibits distension.  Musculoskeletal:        General: Edema (2+ left leg edema w/ chronic changes; tr-1+ right leg edema ) present.  Lymphadenopathy:    She has no cervical adenopathy.  Neurological: She is alert and oriented to person, place, and time.  Skin: Skin is warm and dry.  Psychiatric: She has a normal mood and affect.    ASSESSMENT & PLAN:    1. Chronic diastolic CHF (congestive heart failure) ([place]) 2. Shortness of breath  She notes worsening shortness of breath.  Her shortness of breath is likely multifactorial and related to COPD, CAD and heart failure as well as obesity.  Currently, she seems to be volume overloaded which is exacerbating her shortness of breath.  -Increase Lasix to 40 mg twice daily x1 week, then resume 40 mg daily  -Increase K+ to 20 mEq twice daily x1 week, then resume 20 mEq once daily   -BMET, BNP, CBC today  -Repeat BMP in 1 week  -Obtain follow-up echocardiogram  -Close follow-up with Dr. Burt Knack, me or Ermalinda Barrios, PA-C in 2 weeks  3. Coronary artery disease without angina pectoris 4. Chest pain  Her chest pain is somewhat atypical for ischemia.  She had an abnormal Myoview prior to her PCI in January 2019.  ECG today does not demonstrate any acute ST-T wave changes.  She does have PVCs noted.  Proceed with echocardiogram as noted above.  We will start her  evaluation with stress testing.  If this is  abnormal, she will need cardiac catheterization.  -Continue aspirin, atorvastatin, clopidogrel, metoprolol  -Arrange Lexiscan Myoview  -Continue as needed nitroglycerin  -Follow-up 2 weeks  5.  Leg swelling Left leg is significantly larger than her right.  She has not taken any prolonged trips or been recently hospitalized.  However, given the discrepancy in size, I think we should rule out DVT.  -Arrange left leg venous Doppler  6. PVCs Obtain echocardiogram, BMET, magnesium.  7. CKD (chronic kidney disease), stage III ([place]) Obtain BMET today and repeat BMET in 1 week after increasing Lasix.  8. Essential hypertension Diastolic somewhat elevated today.  Adjust Lasix as noted.  Continue current dose of metoprolol, losartan.  Continue to monitor.  9. Pure hypercholesterolemia Continue statin therapy.   Dispo:  Return in about 2 weeks (around 07/24/2019) for Close Follow Up, w/ Dr. Burt Knack, or Richardson Dopp, PA-C or Ermalinda Barrios, PA-C.   Medication Adjustments/Labs and Tests Ordered: Current medicines are reviewed at length with the patient today.  Concerns regarding medicines are outlined above.  Tests Ordered: Orders Placed This Encounter  Procedures   Basic Metabolic Panel (BMET)   Magnesium   CBC   Pro b natriuretic peptide (BNP)   Basic Metabolic Panel (BMET)   MYOCARDIAL PERFUSION IMAGING   EKG 12-Lead   ECHOCARDIOGRAM COMPLETE   VAS [place] LOWER EXTREMITY VENOUS (DVT)   Medication Changes: Meds ordered this encounter  Medications   furosemide (LASIX) 40 MG tablet    Sig: Take 1 tablet (40 mg total) by mouth daily.    Dispense:  90 tablet    Refill:  3   potassium chloride SA (KLOR-CON M20) 20 MEQ tablet    Sig: TAKE 1 TABLET (20 MEQ TOTAL) DAILY BY MOUTH.    Dispense:  90 tablet    Refill:  3    Signed, Richardson Dopp, PA-C  07/10/2019 [facility] [place],  [place], [place]  [address] Phone: [phone]; Fax: [phone]   ADDENDUM 07/19/2019 11:25 AM  Myoview 07/18/2019:  Nuclear stress EF: 32%.  There was no ST segment deviation noted during stress.  No T wave inversion was noted during stress.  Defect 1: There is a large defect of moderate severity present in the basal inferior, basal inferolateral, basal anterolateral, mid inferolateral, mid anterolateral and apical lateral location.  Defect 2: There is a small defect of moderate severity present in the basal anterior, basal anteroseptal and basal inferoseptal location.  Findings consistent with prior myocardial infarction with peri-infarct ischemia.  This is a high risk study.  The left ventricular ejection fraction is moderately decreased (30-44%).   There is a large moderate severity defect in the lateral walls extending to basal inferior wall, consistent with infarct. There is a second defect at the base from the anterior wall to septum more consistent with ischemia. EF is significantly reduced, and all walls except septal walls appear hypokinetic at rest. Defects slightly worse than prior study.  I reviewed her stress test with Dr. Burt Knack.  We both agreed that the patient should undergo cardiac catheterization for further evaluation.  I contacted patient by phone and discussed her results with her.  Risks and benefits of cardiac catheterization have been discussed with the patient.  These include bleeding, infection, kidney damage, stroke, heart attack, death.  The patient understands these risks and is willing to proceed.   PLAN:  1.  Arrange cardiac catheterization next week. 2.  Patient  knows to go to the emergency room if her symptoms should worsen. 3.  2-week follow-up post cardiac catheterization.  Signed,  Richardson Dopp, PA-C 07/19/2019 11:27 AM

## 2019-07-10 ENCOUNTER — Ambulatory Visit (INDEPENDENT_AMBULATORY_CARE_PROVIDER_SITE_OTHER): Payer: Medicare Other | Admitting: Physician Assistant

## 2019-07-10 ENCOUNTER — Other Ambulatory Visit: Payer: Self-pay

## 2019-07-10 ENCOUNTER — Encounter: Payer: Self-pay | Admitting: Physician Assistant

## 2019-07-10 VITALS — BP 128/92 | HR 94 | Ht 65.0 in | Wt 231.0 lb

## 2019-07-10 DIAGNOSIS — R609 Edema, unspecified: Secondary | ICD-10-CM

## 2019-07-10 DIAGNOSIS — I5032 Chronic diastolic (congestive) heart failure: Secondary | ICD-10-CM

## 2019-07-10 DIAGNOSIS — R9439 Abnormal result of other cardiovascular function study: Secondary | ICD-10-CM

## 2019-07-10 DIAGNOSIS — E7800 Pure hypercholesterolemia, unspecified: Secondary | ICD-10-CM

## 2019-07-10 DIAGNOSIS — N183 Chronic kidney disease, stage 3 unspecified: Secondary | ICD-10-CM

## 2019-07-10 DIAGNOSIS — R0602 Shortness of breath: Secondary | ICD-10-CM

## 2019-07-10 DIAGNOSIS — I10 Essential (primary) hypertension: Secondary | ICD-10-CM

## 2019-07-10 DIAGNOSIS — I2510 Atherosclerotic heart disease of native coronary artery without angina pectoris: Secondary | ICD-10-CM | POA: Diagnosis not present

## 2019-07-10 DIAGNOSIS — R079 Chest pain, unspecified: Secondary | ICD-10-CM | POA: Diagnosis not present

## 2019-07-10 MED ORDER — POTASSIUM CHLORIDE CRYS ER 20 MEQ PO TBCR: EXTENDED_RELEASE_TABLET | ORAL | 3 refills | Status: DC

## 2019-07-10 MED ORDER — FUROSEMIDE 40 MG PO TABS: 40.0000 mg | ORAL_TABLET | Freq: Every day | ORAL | 3 refills | Status: DC

## 2019-07-10 NOTE — Patient Instructions (Signed)
Medication Instructions:  Increase Lasix to 40 mg twice per day for 7 days, then 40 mg Daily  Increase Potassium to 20 meq twice per day for 7 days, then reduce to 20 meq Daily If you need a refill on your cardiac medications before your next appointment, please call your pharmacy.   Lab work:TODAY BMET MAGNESIUM CBC BNP  1 WEEK BMET PLEASE SCHEDULE If you have labs (blood work) drawn today and your tests are completely normal, you will receive your results only by: Marland Kitchen MyChart Message (if you have MyChart) OR . A paper copy in the mail If you have any lab test that is abnormal or we need to change your treatment, we will call you to review the results.  Testing/Procedures: Your physician has requested that you have an echocardiogram. Echocardiography is a painless test that uses sound waves to create images of your heart. It provides your doctor with information about the size and shape of your heart and how well your heart's chambers and valves are working. This procedure takes approximately one hour. There are no restrictions for this procedure.  Your physician has requested that you have a Left Leg lower  extremity venous duplex. This test is an ultrasound of the veins in the legs or arms. It looks at venous blood flow that carries blood from the heart to the legs or arms. Allow one hour for a Lower Venous exam. Allow thirty minutes for an Upper Venous exam. There are no restrictions or special instructions.  Your physician has requested that you have a lexiscan myoview. For further information please visit [email or web address].[email or web address]. Please follow instruction sheet, as given.       Follow-Up: 2 WEEKS WITH Dr Burt Knack, Nicki Reaper or [place], you and your health needs are our priority.  As part of our continuing mission to provide you with exceptional heart care, we have created designated Provider Care Teams.  These Care Teams include your primary Cardiologist (physician) and  Advanced Practice Providers (APPs -  Physician Assistants and Nurse Practitioners) who all work together to provide you with the care you need, when you need it. .   Any Other Special Instructions Will Be Listed Below (If Applicable).  DASH Eating Plan DASH stands for "Dietary Approaches to Stop Hypertension." The DASH eating plan is a healthy eating plan that has been shown to reduce high blood pressure (hypertension). It may also reduce your risk for type 2 diabetes, heart disease, and stroke. The DASH eating plan may also help with weight loss. What are tips for following this plan?  General guidelines  Avoid eating more than 2,300 mg (milligrams) of salt (sodium) a day. If you have hypertension, you may need to reduce your sodium intake to 1,500 mg a day.  Limit alcohol intake to no more than 1 drink a day for nonpregnant women and 2 drinks a day for men. One drink equals 12 oz of beer, 5 oz of wine, or 1 oz of hard liquor.  Work with your health care provider to maintain a healthy body weight or to lose weight. Ask what an ideal weight is for you.  Get at least 30 minutes of exercise that causes your heart to beat faster (aerobic exercise) most days of the week. Activities may include walking, swimming, or biking.  Work with your health care provider or diet and nutrition specialist (dietitian) to adjust your eating plan to your individual calorie needs. Reading food labels   Check food labels for  the amount of sodium per serving. Choose foods with less than 5 percent of the Daily Value of sodium. Generally, foods with less than 300 mg of sodium per serving fit into this eating plan.  To find whole grains, look for the word "whole" as the first word in the ingredient list. Shopping  Buy products labeled as "low-sodium" or "no salt added."  Buy fresh foods. Avoid canned foods and premade or frozen meals. Cooking  Avoid adding salt when cooking. Use salt-free seasonings or herbs  instead of table salt or sea salt. Check with your health care provider or pharmacist before using salt substitutes.  Do not fry foods. Cook foods using healthy methods such as baking, boiling, grilling, and broiling instead.  Cook with heart-healthy oils, such as olive, canola, soybean, or sunflower oil. Meal planning  Eat a balanced diet that includes: ? 5 or more servings of fruits and vegetables each day. At each meal, try to fill half of your plate with fruits and vegetables. ? Up to 6-8 servings of whole grains each day. ? Less than 6 oz of lean meat, poultry, or fish each day. A 3-oz serving of meat is about the same size as a deck of cards. One egg equals 1 oz. ? 2 servings of low-fat dairy each day. ? A serving of nuts, seeds, or beans 5 times each week. ? Heart-healthy fats. Healthy fats called Omega-3 fatty acids are found in foods such as flaxseeds and coldwater fish, like sardines, salmon, and mackerel.  Limit how much you eat of the following: ? Canned or prepackaged foods. ? Food that is high in trans fat, such as fried foods. ? Food that is high in saturated fat, such as fatty meat. ? Sweets, desserts, sugary drinks, and other foods with added sugar. ? Full-fat dairy products.  Do not salt foods before eating.  Try to eat at least 2 vegetarian meals each week.  Eat more home-cooked food and less restaurant, buffet, and fast food.  When eating at a restaurant, ask that your food be prepared with less salt or no salt, if possible. What foods are recommended? The items listed may not be a complete list. Talk with your dietitian about what dietary choices are best for you. Grains Whole-grain or whole-wheat bread. Whole-grain or whole-wheat pasta. Brown rice. [place]ena Morrow. Bulgur. Whole-grain and low-sodium cereals. Pita bread. Low-fat, low-sodium crackers. Whole-wheat flour tortillas. Vegetables Fresh or frozen vegetables (raw, steamed, roasted, or grilled).  Low-sodium or reduced-sodium tomato and vegetable juice. Low-sodium or reduced-sodium tomato sauce and tomato paste. Low-sodium or reduced-sodium canned vegetables. Fruits All fresh, dried, or frozen fruit. Canned fruit in natural juice (without added sugar). Meat and other protein foods Skinless chicken or [place]. Ground chicken or [place]. Pork with fat trimmed off. Fish and seafood. Egg whites. Dried beans, peas, or lentils. Unsalted nuts, nut butters, and seeds. Unsalted canned beans. Lean cuts of beef with fat trimmed off. Low-sodium, lean deli meat. Dairy Low-fat (1%) or fat-free (skim) milk. Fat-free, low-fat, or reduced-fat cheeses. Nonfat, low-sodium ricotta or cottage cheese. Low-fat or nonfat yogurt. Low-fat, low-sodium cheese. Fats and oils Soft margarine without trans fats. Vegetable oil. Low-fat, reduced-fat, or light mayonnaise and salad dressings (reduced-sodium). Canola, safflower, olive, soybean, and sunflower oils. Avocado. Seasoning and other foods Herbs. Spices. Seasoning mixes without salt. Unsalted popcorn and pretzels. Fat-free sweets. What foods are not recommended? The items listed may not be a complete list. Talk with your dietitian about what dietary choices are  best for you. Grains Baked goods made with fat, such as croissants, muffins, or some breads. Dry pasta or rice meal packs. Vegetables Creamed or fried vegetables. Vegetables in a cheese sauce. Regular canned vegetables (not low-sodium or reduced-sodium). Regular canned tomato sauce and paste (not low-sodium or reduced-sodium). Regular tomato and vegetable juice (not low-sodium or reduced-sodium). Angie Fava. Olives. Fruits Canned fruit in a light or heavy syrup. Fried fruit. Fruit in cream or butter sauce. Meat and other protein foods Fatty cuts of meat. Ribs. Fried meat. Berniece Salines. Sausage. Bologna and other processed lunch meats. Salami. Fatback. Hotdogs. Bratwurst. Salted nuts and seeds. Canned beans with added  salt. Canned or smoked fish. Whole eggs or egg yolks. Chicken or [place] with skin. Dairy Whole or 2% milk, cream, and half-and-half. Whole or full-fat cream cheese. Whole-fat or sweetened yogurt. Full-fat cheese. Nondairy creamers. Whipped toppings. Processed cheese and cheese spreads. Fats and oils Butter. Stick margarine. Lard. Shortening. Ghee. Bacon fat. Tropical oils, such as coconut, palm kernel, or palm oil. Seasoning and other foods Salted popcorn and pretzels. Onion salt, garlic salt, seasoned salt, table salt, and sea salt. Worcestershire sauce. Tartar sauce. Barbecue sauce. Teriyaki sauce. Soy sauce, including reduced-sodium. Steak sauce. Canned and packaged gravies. Fish sauce. Oyster sauce. Cocktail sauce. Horseradish that you find on the shelf. Ketchup. Mustard. Meat flavorings and tenderizers. Bouillon cubes. Hot sauce and Tabasco sauce. Premade or packaged marinades. Premade or packaged taco seasonings. Relishes. Regular salad dressings. Where to find more information:  National Heart, Lung, and [place]: https://wilson-eaton.com/  American Heart Association: www.heart.org Summary  The DASH eating plan is a healthy eating plan that has been shown to reduce high blood pressure (hypertension). It may also reduce your risk for type 2 diabetes, heart disease, and stroke.  With the DASH eating plan, you should limit salt (sodium) intake to 2,300 mg a day. If you have hypertension, you may need to reduce your sodium intake to 1,500 mg a day.  When on the DASH eating plan, aim to eat more fresh fruits and vegetables, whole grains, lean proteins, low-fat dairy, and heart-healthy fats.  Work with your health care provider or diet and nutrition specialist (dietitian) to adjust your eating plan to your individual calorie needs. This information is not intended to replace advice given to you by your health care provider. Make sure you discuss any questions you have with your health care  provider. Document Released: 09/22/2011 Document Revised: 09/15/2017 Document Reviewed: 09/26/2016 Elsevier Patient Education  2020 [employer].

## 2019-07-11 ENCOUNTER — Ambulatory Visit (INDEPENDENT_AMBULATORY_CARE_PROVIDER_SITE_OTHER): Payer: Medicare Other | Admitting: Family Medicine

## 2019-07-11 ENCOUNTER — Encounter: Payer: Self-pay | Admitting: Family Medicine

## 2019-07-11 ENCOUNTER — Ambulatory Visit: Payer: Self-pay | Admitting: Family Medicine

## 2019-07-11 VITALS — BP 128/92 | Temp 97.7°F | Wt 231.0 lb

## 2019-07-11 DIAGNOSIS — M7989 Other specified soft tissue disorders: Secondary | ICD-10-CM | POA: Diagnosis not present

## 2019-07-11 DIAGNOSIS — I2510 Atherosclerotic heart disease of native coronary artery without angina pectoris: Secondary | ICD-10-CM | POA: Diagnosis not present

## 2019-07-11 LAB — PRO B NATRIURETIC PEPTIDE: NT-Pro BNP: 435 pg/mL — ABNORMAL HIGH (ref 0–301)

## 2019-07-11 LAB — BASIC METABOLIC PANEL
BUN/Creatinine Ratio: 20 (ref 12–28)
BUN: 27 mg/dL (ref 8–27)
CO2: 23 mmol/L (ref 20–29)
Calcium: 9.8 mg/dL (ref 8.7–10.3)
Chloride: 105 mmol/L (ref 96–106)
Creatinine, Ser: 1.37 mg/dL — ABNORMAL HIGH (ref 0.57–1.00)
GFR calc Af Amer: 45 mL/min/{1.73_m2} — ABNORMAL LOW (ref >59)
GFR calc non Af Amer: 39 mL/min/{1.73_m2} — ABNORMAL LOW (ref >59)
Glucose: 107 mg/dL — ABNORMAL HIGH (ref 65–99)
Potassium: 4.5 mmol/L (ref 3.5–5.2)
Sodium: 142 mmol/L (ref 134–144)

## 2019-07-11 LAB — CBC
Hematocrit: 39.2 % (ref 34.0–46.6)
Hemoglobin: 12.9 g/dL (ref 11.1–15.9)
MCH: 27.8 pg (ref 26.6–33.0)
MCHC: 32.9 g/dL (ref 31.5–35.7)
MCV: 85 fL (ref 79–97)
Platelets: 288 10*3/uL (ref 150–450)
RBC: 4.64 x10E6/uL (ref 3.77–5.28)
RDW: 15.8 % — ABNORMAL HIGH (ref 11.7–15.4)
WBC: 12.3 10*3/uL — ABNORMAL HIGH (ref 3.4–10.8)

## 2019-07-11 LAB — MAGNESIUM: Magnesium: 1.9 mg/dL (ref 1.6–2.3)

## 2019-07-11 MED ORDER — DOXYCYCLINE HYCLATE 100 MG PO TABS: 100.0000 mg | ORAL_TABLET | Freq: Two times a day (BID) | ORAL | 0 refills | Status: DC

## 2019-07-11 NOTE — Progress Notes (Signed)
Virtual Visit via Video Note  I connected with Tracy [name]  on 07/11/19 at  4:20 PM EDT by a video enabled telemedicine application and verified that I am speaking with the correct person using two identifiers.  Location patient: home Location provider:work or home office Persons participating in the virtual visit: patient, provider  I discussed the limitations of evaluation and management by telemedicine and the availability of in person appointments. The patient expressed understanding and agreed to proceed.   HPI:  Acute visit for LE edema: -swelling is a chronic issue for her in her legs, but lately the R let has more swelling and she has a pain in the R ant leg with mild erythema there -reports saw her PCP 2 months ago and was on some intermittent lasix and and horse chestnut -she also saw her pulm doc about this who also advised lasix -she saw her cardiologist about this yesterday and they are getting a duplex of her legs and increased her lasix 40mg  bid x 1 week and they did blood work -she is worried about an infection in her skin and feels has not been address as her cardiologist did not give her an abx -she had a bad infection in this leg a few years ago after a fall per her report, she had a huge knot on this leg of infection at the time. O/w denies other injuries or surgery on this leg.   ROS: See pertinent positives and negatives per HPI.  Past Medical History:  Diagnosis Date  . Allergic rhinitis   . CAD (coronary artery disease)    1/19 PCI/DES to [facility] for ISR, normal EF.   Marland Kitchen CHF (congestive heart failure) ([place])   . Gout   . Headache(784.0)   . Hyperlipidemia   . Hypertension   . Low back pain   . Menopausal syndrome   . Myocardial infarct ([place]) 2007   hx of  . Overactive bladder     Past Surgical History:  Procedure Laterality Date  . ANGIOPLASTY     stent 2007  . CARDIAC CATHETERIZATION    . COLONOSCOPY    . CORONARY STENT INTERVENTION N/A 11/06/2017   Procedure: CORONARY STENT INTERVENTION;  Surgeon: [name], MD;  Location: [facility];  Service: Cardiovascular;  Laterality: N/A;  . LEFT HEART CATH AND CORONARY ANGIOGRAPHY N/A 11/06/2017   Procedure: LEFT HEART CATH AND CORONARY ANGIOGRAPHY;  Surgeon: [name], MD;  Location: [facility];  Service: Cardiovascular;  Laterality: N/A;  . TONSILLECTOMY      Family History  Adopted: Yes  Problem Relation Age of Onset  . Colon cancer Mother 52  . Other Other        patient is adopted  . Stomach cancer Neg Hx     SOCIAL HX: see hpi   Current Outpatient Medications:  .  albuterol (PROVENTIL HFA;VENTOLIN HFA) 108 (90 Base) MCG/ACT inhaler, Inhale 2 puffs into the lungs every 4 (four) hours as needed for wheezing or shortness of breath., Disp: 1 Inhaler, Rfl: 5 .  albuterol (PROVENTIL) (2.5 MG/3ML) 0.083% nebulizer solution, Take 3 mLs (2.5 mg total) by nebulization every 6 (six) hours as needed for wheezing or shortness of breath., Disp: 300 mL, Rfl: 5 .  allopurinol (ZYLOPRIM) 100 MG tablet, TAKE 1 TABLET BY MOUTH EVERY DAY, Disp: 30 tablet, Rfl: 0 .  ALPRAZolam (XANAX) 0.25 MG tablet, TAKE 1 TABLET TWICE A DAY AS NEEDED FOR ANXIETY, Disp: 30 tablet, Rfl: 0 .  aspirin EC 81 MG tablet, Take 81 mg by mouth daily., Disp: , Rfl:  .  atorvastatin (LIPITOR) 40 MG tablet, TAKE 1 TABLET DAILY BY MOUTH. PLEASE KEEP UPCOMING APPT FOR FUTURE REFILLS. THANKS, Disp: 90 tablet, Rfl: 3 .  calcium carbonate (CALCIUM 600) 600 MG TABS tablet, Take 600 mg by mouth See admin instructions. Once every 2 weeks, Disp: , Rfl:  .  cetirizine (ZYRTEC) 10 MG tablet, Take 10 mg by mouth daily., Disp: , Rfl:  .  Cholecalciferol (VITAMIN D3 PO), Take 1,000 Units by mouth daily., Disp: , Rfl:  .  clopidogrel (PLAVIX) 75 MG tablet, TAKE 1 TABLET (75 MG TOTAL) BY MOUTH DAILY WITH BREAKFAST., Disp: 90 tablet, Rfl: 1 .  Coenzyme Q10 (CO Q 10 PO), Take 300 mg by mouth See admin instructions. Once  every 2 weeks., Disp: , Rfl:  .  Cyanocobalamin (VITAMIN B-12) 5000 MCG SUBL, Place 10,000 mcg under the tongue daily. , Disp: , Rfl:  .  diclofenac sodium (VOLTAREN) 1 % GEL, Apply 4 g topically 4 (four) times daily., Disp: 4 Tube, Rfl: 3 .  fluticasone (FLONASE) 50 MCG/ACT nasal spray, Place 2 sprays into both nostrils daily., Disp: 16 g, Rfl: 5 .  furosemide (LASIX) 40 MG tablet, Take 1 tablet (40 mg total) by mouth daily., Disp: 90 tablet, Rfl: 3 .  Garlic [id number] MG CAPS, Take by mouth daily., Disp: , Rfl:  .  HYDROcodone-acetaminophen (NORCO/VICODIN) 5-325 MG tablet, Take 1 tablet by mouth daily as needed for moderate pain., Disp: 30 tablet, Rfl: 0 .  losartan (COZAAR) 100 MG tablet, TAKE 1 TABLET BY MOUTH EVERY DAY, Disp: 90 tablet, Rfl: 3 .  metoprolol tartrate (LOPRESSOR) 100 MG tablet, TAKE 1 TABLET (100MG ) BY MOUTH IN THE MORNING AND ONE-HALF TABLET (50MG ) AT BEDTIME, Disp: 135 tablet, Rfl: 1 .  Misc Natural Products (TART CHERRY ADVANCED) CAPS, Take 1,000 capsules by mouth daily. , Disp: , Rfl:  .  nitroGLYCERIN (NITROSTAT) 0.4 MG SL tablet, PLACE 1 TABLET (0.4 MG TOTAL) UNDER THE TONGUE EVERY 5 (FIVE) MINUTES AS NEEDED FOR CHEST PAIN., Disp: 25 tablet, Rfl: 3 .  NON FORMULARY, 83.3 mg daily., Disp: , Rfl:  .  OVER THE COUNTER MEDICATION, Take 1 tablet by mouth daily. Horse chestnut extract, Disp: , Rfl:  .  pantoprazole (PROTONIX) 20 MG tablet, Take 1 tablet (20 mg total) by mouth daily., Disp: 90 tablet, Rfl: 3 .  potassium chloride SA (KLOR-CON M20) 20 MEQ tablet, TAKE 1 TABLET (20 MEQ TOTAL) DAILY BY MOUTH., Disp: 90 tablet, Rfl: 3 .  umeclidinium-vilanterol (ANORO ELLIPTA) 62.5-25 MCG/INH AEPB, Inhale 1 puff into the lungs daily., Disp: 60 each, Rfl: 3 .  vitamin E 200 UNIT capsule, Take 200 Units by mouth as needed. , Disp: , Rfl:   EXAM:  VITALS per patient if applicable:  GENERAL: alert, oriented, appears well and in no acute distress  HEENT: atraumatic, conjunttiva clear, no  obvious abnormalities on inspection of external nose and ears  NECK: normal movements of the head and neck  LUNGS: on inspection no signs of respiratory distress, breathing rate appears normal, no obvious gross SOB, gasping or wheezing  CV: no obvious cyanosis  MS: moves all visible extremities without noticeable abnormality, poor video quality, but do appreciate visible swelling of the leg in question > the contralateral with small area of erythema ant shin  PSYCH/NEURO: pleasant and cooperative, no obvious depression or anxiety, speech and thought processing grossly intact  ASSESSMENT AND PLAN:  Discussed the following assessment and plan:  No diagnosis found.  -we discussed possible serious and likely etiologies, options for evaluation and workup, limitations of telemedicine visit vs in person visit, treatment, treatment risks and precautions. Pt prefers to treat via telemedicine empirically rather then risking or undertaking an in person visit at this moment as she had an [place] visit with her cardiologist for the same yesterday. I agree needs dexa and her cardiologist monitors her heat as well. They also did labs and increased her lasix - all very appropriate. She really feels and abx is needed as well. Advised this is possible as an acute cause of worsening, but is not likely the cause of longterm issues. It is possible her prior reported infection in this leg predisposes her to edema and infection. Opted for course of doxy 100mg  bid with follow up with PCP. Sent message to schedulers to assist. Also, advised she follow her cardiologists instructions and get the duplex. Patient also agrees to seek prompt in person care if worsening, new symptoms arise, or if is not improving with treatment.   I discussed the assessment and treatment plan with the patient. The patient was provided an opportunity to ask questions and all were answered. The patient agreed with the plan and demonstrated an  understanding of the instructions.    Lucretia Kern, DO

## 2019-07-11 NOTE — Telephone Encounter (Signed)
Pt reports swelling of both legs. Reports "Front" of left leg with redness and warmth "Between ankle and knee" with swelling at calf. Pt states saw Dr. [name] in July for issue,swelling has worsened past 2 weeks. States area very sensitive, tender to touch. Saw cardiologist yesterday who drew labs and noted pt's WBCs were elevated (12.3). Instructed pt to call PCP. Pt states cardiologist also ordered venous dopplers "Scheduled for 07/19/2019." Pt is on Plavix. Reports cardiologist also increased pt's lasix. Pt denies fever. States mild SOB but from "My COPD." [place] called practice, [facility], for consideration of appt. Care advise given to pt; go to ED for any fever, SOB, increased swelling, increased pain/numbness. Pt verbalizes understanding.  Reason for Disposition . [1] MODERATE leg swelling (e.g., swelling extends up to knees) AND [2] new onset or worsening  Answer Assessment - Initial Assessment Questions 1. ONSET: "When did the swelling start?" (e.g., minutes, hours, days)     In July, worsening past 2 weeks 2. LOCATION: "What part of the leg is swollen?"  "Are both legs swollen or just one leg?"     Both legs L>R. Left leg between ankle and knee, mostly swollen at calf 3. SEVERITY: "How bad is the swelling?" (e.g., localized; mild, moderate, severe)  - Localized - small area of swelling localized to one leg  - MILD pedal edema - swelling limited to foot and ankle, pitting edema < 1/4 inch (6 mm) deep, rest and elevation eliminate most or all swelling  - MODERATE edema - swelling of lower leg to knee, pitting edema > 1/4 inch (6 mm) deep, rest and elevation only partially reduce swelling  - SEVERE edema - swelling extends above knee, facial or hand swelling present      *No Answer* 4. REDNESS: "Does the swelling look red or infected?"     Red and warm in front of leg, swelling in back 5. PAIN: "Is the swelling painful to touch?" If so, ask: "How painful is it?"   (Scale 1-10; mild, moderate or  severe)     7-8/10 6. FEVER: "Do you have a fever?" If so, ask: "What is it, how was it measured, and when did it start?"      no 7. CAUSE: "What do you think is causing the leg swelling?"    "Infection" 8. MEDICAL HISTORY: "Do you have a history of heart failure, kidney disease, liver failure, or cancer?"     yes 9. RECURRENT SYMPTOM: "Have you had leg swelling before?" If so, ask: "When was the last time?" "What happened that time?"     Since July 10. OTHER SYMPTOMS: "Do you have any other symptoms?" (e.g., chest pain, difficulty breathing)      no  Protocols used: LEG SWELLING AND EDEMA-A-AH

## 2019-07-11 NOTE — Telephone Encounter (Signed)
Pt scheduled for virtual with Dr. Maudie Mercury.

## 2019-07-11 NOTE — Patient Instructions (Signed)
-  I sent the medication(s) we discussed to your pharmacy:  Meds ordered this encounter  Medications  . doxycycline (VIBRA-TABS) 100 MG tablet    Sig: Take 1 tablet (100 mg total) by mouth 2 (two) times daily.    Dispense:  10 tablet    Refill:  0    Please let us know if you have any questions or concerns regarding this prescription.  Follow up with your doctor, Dr. [name], in 5-7 days. Follow all of the instructions from you cardiologist.   I hope you are feeling better soon! Seek care promptly if your symptoms worsen, new concerns arise or you are not improving with treatment.

## 2019-07-15 ENCOUNTER — Telehealth (HOSPITAL_COMMUNITY): Payer: Self-pay | Admitting: *Deleted

## 2019-07-15 NOTE — Telephone Encounter (Signed)
Patient given detailed instructions per Myocardial Perfusion Study Information Sheet for the test on 07/17/19. Patient notified to arrive 15 minutes early and that it is imperative to arrive on time for appointment to keep from having the test rescheduled.  If you need to cancel or reschedule your appointment, please call the office within 24 hours of your appointment. . Patient verbalized understanding. Tracy [name]

## 2019-07-17 ENCOUNTER — Other Ambulatory Visit: Payer: Self-pay

## 2019-07-17 ENCOUNTER — Other Ambulatory Visit: Payer: Medicare Other | Admitting: *Deleted

## 2019-07-17 ENCOUNTER — Ambulatory Visit (HOSPITAL_BASED_OUTPATIENT_CLINIC_OR_DEPARTMENT_OTHER): Payer: Medicare Other

## 2019-07-17 ENCOUNTER — Ambulatory Visit (HOSPITAL_COMMUNITY): Payer: Medicare Other | Attending: Internal Medicine

## 2019-07-17 ENCOUNTER — Encounter: Payer: Self-pay | Admitting: Physician Assistant

## 2019-07-17 DIAGNOSIS — E7800 Pure hypercholesterolemia, unspecified: Secondary | ICD-10-CM | POA: Insufficient documentation

## 2019-07-17 DIAGNOSIS — I2510 Atherosclerotic heart disease of native coronary artery without angina pectoris: Secondary | ICD-10-CM | POA: Diagnosis not present

## 2019-07-17 DIAGNOSIS — N183 Chronic kidney disease, stage 3 unspecified: Secondary | ICD-10-CM

## 2019-07-17 DIAGNOSIS — I5032 Chronic diastolic (congestive) heart failure: Secondary | ICD-10-CM | POA: Insufficient documentation

## 2019-07-17 DIAGNOSIS — I10 Essential (primary) hypertension: Secondary | ICD-10-CM | POA: Insufficient documentation

## 2019-07-17 DIAGNOSIS — R609 Edema, unspecified: Secondary | ICD-10-CM

## 2019-07-17 DIAGNOSIS — R079 Chest pain, unspecified: Secondary | ICD-10-CM

## 2019-07-17 LAB — BASIC METABOLIC PANEL
BUN/Creatinine Ratio: 22 (ref 12–28)
BUN: 30 mg/dL — ABNORMAL HIGH (ref 8–27)
CO2: 21 mmol/L (ref 20–29)
Calcium: 9.2 mg/dL (ref 8.7–10.3)
Chloride: 102 mmol/L (ref 96–106)
Creatinine, Ser: 1.38 mg/dL — ABNORMAL HIGH (ref 0.57–1.00)
GFR calc Af Amer: 44 mL/min/{1.73_m2} — ABNORMAL LOW (ref >59)
GFR calc non Af Amer: 38 mL/min/{1.73_m2} — ABNORMAL LOW (ref >59)
Glucose: 110 mg/dL — ABNORMAL HIGH (ref 65–99)
Potassium: 4.2 mmol/L (ref 3.5–5.2)
Sodium: 139 mmol/L (ref 134–144)

## 2019-07-17 LAB — ECHOCARDIOGRAM COMPLETE
Height: 65 in
Weight: 3696 oz

## 2019-07-17 MED ORDER — PERFLUTREN LIPID MICROSPHERE
1.0000 mL | INTRAVENOUS | Status: AC | PRN
  Administered 2019-07-17: 1 mL via INTRAVENOUS

## 2019-07-17 MED ORDER — TECHNETIUM TC 99M TETROFOSMIN IV KIT
31.6000 | PACK | Freq: Once | INTRAVENOUS | Status: AC | PRN
  Administered 2019-07-17: 31.6 via INTRAVENOUS
  Filled 2019-07-17: qty 32

## 2019-07-18 ENCOUNTER — Ambulatory Visit (HOSPITAL_COMMUNITY): Payer: Medicare Other

## 2019-07-18 DIAGNOSIS — R609 Edema, unspecified: Secondary | ICD-10-CM | POA: Diagnosis not present

## 2019-07-18 DIAGNOSIS — R079 Chest pain, unspecified: Secondary | ICD-10-CM | POA: Diagnosis not present

## 2019-07-18 DIAGNOSIS — I2510 Atherosclerotic heart disease of native coronary artery without angina pectoris: Secondary | ICD-10-CM | POA: Diagnosis not present

## 2019-07-18 DIAGNOSIS — N183 Chronic kidney disease, stage 3 unspecified: Secondary | ICD-10-CM | POA: Diagnosis not present

## 2019-07-18 DIAGNOSIS — I5032 Chronic diastolic (congestive) heart failure: Secondary | ICD-10-CM | POA: Diagnosis not present

## 2019-07-18 DIAGNOSIS — E7800 Pure hypercholesterolemia, unspecified: Secondary | ICD-10-CM | POA: Diagnosis not present

## 2019-07-18 DIAGNOSIS — I10 Essential (primary) hypertension: Secondary | ICD-10-CM | POA: Diagnosis not present

## 2019-07-18 LAB — MYOCARDIAL PERFUSION IMAGING
LV dias vol: 111 mL (ref 46–106)
LV sys vol: 76 mL
Peak HR: 97 {beats}/min
Rest HR: 78 {beats}/min
SDS: 7
SRS: 9
SSS: 16
TID: 1.02

## 2019-07-18 MED ORDER — TECHNETIUM TC 99M TETROFOSMIN IV KIT
33.0000 | PACK | Freq: Once | INTRAVENOUS | Status: AC | PRN
  Administered 2019-07-18: 33 via INTRAVENOUS
  Filled 2019-07-18: qty 33

## 2019-07-18 MED ORDER — REGADENOSON 0.4 MG/5ML IV SOLN
0.4000 mg | Freq: Once | INTRAVENOUS | Status: AC
  Administered 2019-07-18: 0.4 mg via INTRAVENOUS

## 2019-07-18 MED FILL — Regadenoson IV Inj 0.4 MG/5ML (0.08 MG/ML): INTRAVENOUS | Qty: 5 | Status: AC

## 2019-07-19 ENCOUNTER — Other Ambulatory Visit: Payer: Self-pay

## 2019-07-19 ENCOUNTER — Ambulatory Visit (HOSPITAL_COMMUNITY)
Admission: RE | Admit: 2019-07-19 | Discharge: 2019-07-19 | Disposition: A | Discharge status: Home / Self Care | Payer: Medicare Other | Source: Ambulatory Visit | Attending: Cardiology | Admitting: Cardiology

## 2019-07-19 ENCOUNTER — Telehealth: Payer: Self-pay

## 2019-07-19 DIAGNOSIS — E7800 Pure hypercholesterolemia, unspecified: Secondary | ICD-10-CM

## 2019-07-19 DIAGNOSIS — I10 Essential (primary) hypertension: Secondary | ICD-10-CM

## 2019-07-19 DIAGNOSIS — I5032 Chronic diastolic (congestive) heart failure: Secondary | ICD-10-CM

## 2019-07-19 DIAGNOSIS — R079 Chest pain, unspecified: Secondary | ICD-10-CM | POA: Insufficient documentation

## 2019-07-19 DIAGNOSIS — N183 Chronic kidney disease, stage 3 unspecified: Secondary | ICD-10-CM

## 2019-07-19 DIAGNOSIS — I2510 Atherosclerotic heart disease of native coronary artery without angina pectoris: Secondary | ICD-10-CM

## 2019-07-19 DIAGNOSIS — R609 Edema, unspecified: Secondary | ICD-10-CM | POA: Diagnosis not present

## 2019-07-19 NOTE — Telephone Encounter (Addendum)
Spoke with patient, Pt is scheduled for a left heart cath with Dr. Angelena Form on 07/24/2019. Pt will have covid testing done on 07/22/2019. Pt is scheduled to see Daune Perch, NP on 08/07/2019. I went over heart cath instructions. Pt verbalized understanding and asked me to send her  instructions to mychart.        ----- Message from Liliane Shi, PA-C sent at 07/19/2019 10:22 AM EDT ----- Please call the patient. The stress test shows lateral scar and anterior ischemia.  I reviewed the study with Dr. Burt Knack and we agree that she needs a cardiac catheterization.  I did call the patient today to discuss her test results and review the need for the procedure.  She agrees to proceed. PLAN:   - Schedule cardiac catheterization with Dr. Burt Knack next week.  She states any day is fine.   - She has a LE venous duplex today at 2:00.  If she needs any labs she can have them done at that time.  - Please call her on her cell phone to discuss instructions [phone]).  - Send Copy to PCP Richardson Dopp, PA-C    07/19/2019 10:17 AM

## 2019-07-19 NOTE — Addendum Note (Signed)
Addended byKathlen Mody, Nicki Reaper T on: 07/19/2019 11:30 AM   Modules accepted: Orders, SmartSet

## 2019-07-22 ENCOUNTER — Other Ambulatory Visit: Payer: Self-pay | Admitting: *Deleted

## 2019-07-22 ENCOUNTER — Other Ambulatory Visit (HOSPITAL_COMMUNITY)
Admission: RE | Admit: 2019-07-22 | Discharge: 2019-07-22 | Disposition: A | Discharge status: Home / Self Care | Payer: Medicare Other | Source: Ambulatory Visit | Attending: Cardiovascular Disease | Admitting: Cardiovascular Disease

## 2019-07-22 DIAGNOSIS — Z01812 Encounter for preprocedural laboratory examination: Secondary | ICD-10-CM | POA: Insufficient documentation

## 2019-07-22 DIAGNOSIS — Z20828 Contact with and (suspected) exposure to other viral communicable diseases: Secondary | ICD-10-CM | POA: Insufficient documentation

## 2019-07-22 DIAGNOSIS — R0602 Shortness of breath: Secondary | ICD-10-CM | POA: Insufficient documentation

## 2019-07-22 LAB — SARS CORONAVIRUS 2 (TAT 6-24 HRS): SARS Coronavirus 2: NEGATIVE

## 2019-07-22 MED ORDER — POTASSIUM CHLORIDE CRYS ER 20 MEQ PO TBCR: 20.0000 meq | EXTENDED_RELEASE_TABLET | Freq: Two times a day (BID) | ORAL | 1 refills | Status: DC

## 2019-07-23 ENCOUNTER — Telehealth: Payer: Self-pay | Admitting: *Deleted

## 2019-07-23 NOTE — Telephone Encounter (Signed)
Pt contacted pre-catheterization scheduled at [facility] for: July 24, 2019 2:30 PM Verified arrival time and place: [facility]) at: 9:30 AM-pre procedure hydration   No solid food after midnight prior to cath, clear liquids until 5 AM day of procedure. Contrast allergy: no  Hold: Losartan-day before and day of procedure-GFR 38 Lasix-day before and day of procedure-GFR  38 KCl-day before and day of procedure  Except hold medications AM meds can be  taken pre-cath with sip of water including: ASA 81 mg Plavix 75 mg  Confirmed patient has responsible adult to drive home post procedure and observe 24 hours after arriving home: yes  Currently, due to Covid-19 pandemic, only one support person will be allowed with patient. Must be the same support person for that patient's entire stay, will be screened and required to wear a mask. They will be asked to wait in the waiting room for the duration of the patient's stay.  Patients are required to wear a mask when they enter the hospital.      COVID-19 Pre-Screening Questions:  . In the past 7 to 10 days have you had a cough,  shortness of breath, headache, congestion, fever (100 or greater) body aches, chills, sore throat, or sudden loss of taste or sense of smell? Shortness of breath . Have you been around anyone with known Covid 19? no . Have you been around anyone who is awaiting Covid 19 test results in the past 7 to 10 days? no . Have you been around anyone who has been exposed to Covid 19, or has mentioned symptoms of Covid 19 within the past 7 to 10 days? no  I reviewed procedure/mask/visitor instructions, Covid-19 screening questions with patient, she verbalized understanding, thanked me for call.

## 2019-07-24 ENCOUNTER — Encounter (HOSPITAL_COMMUNITY)
Admission: RE | Disposition: A | Discharge status: Home / Self Care | Payer: Self-pay | Source: Home / Self Care | Attending: Cardiovascular Disease

## 2019-07-24 ENCOUNTER — Other Ambulatory Visit: Payer: Self-pay

## 2019-07-24 ENCOUNTER — Ambulatory Visit (HOSPITAL_COMMUNITY)
Admission: RE | Admit: 2019-07-24 | Discharge: 2019-07-24 | Disposition: A | Discharge status: Home / Self Care | Payer: Medicare Other | Attending: Cardiovascular Disease | Admitting: Cardiovascular Disease

## 2019-07-24 DIAGNOSIS — R0602 Shortness of breath: Secondary | ICD-10-CM

## 2019-07-24 DIAGNOSIS — M109 Gout, unspecified: Secondary | ICD-10-CM | POA: Diagnosis not present

## 2019-07-24 DIAGNOSIS — Z955 Presence of coronary angioplasty implant and graft: Secondary | ICD-10-CM | POA: Insufficient documentation

## 2019-07-24 DIAGNOSIS — E785 Hyperlipidemia, unspecified: Secondary | ICD-10-CM | POA: Insufficient documentation

## 2019-07-24 DIAGNOSIS — J449 Chronic obstructive pulmonary disease, unspecified: Secondary | ICD-10-CM | POA: Insufficient documentation

## 2019-07-24 DIAGNOSIS — E7800 Pure hypercholesterolemia, unspecified: Secondary | ICD-10-CM | POA: Insufficient documentation

## 2019-07-24 DIAGNOSIS — I25110 Atherosclerotic heart disease of native coronary artery with unstable angina pectoris: Secondary | ICD-10-CM | POA: Insufficient documentation

## 2019-07-24 DIAGNOSIS — Z79899 Other long term (current) drug therapy: Secondary | ICD-10-CM | POA: Insufficient documentation

## 2019-07-24 DIAGNOSIS — R079 Chest pain, unspecified: Secondary | ICD-10-CM

## 2019-07-24 DIAGNOSIS — Z87891 Personal history of nicotine dependence: Secondary | ICD-10-CM | POA: Diagnosis not present

## 2019-07-24 DIAGNOSIS — I130 Hypertensive heart and chronic kidney disease with heart failure and stage 1 through stage 4 chronic kidney disease, or unspecified chronic kidney disease: Secondary | ICD-10-CM | POA: Diagnosis not present

## 2019-07-24 DIAGNOSIS — I252 Old myocardial infarction: Secondary | ICD-10-CM | POA: Diagnosis not present

## 2019-07-24 DIAGNOSIS — Z6838 Body mass index (BMI) 38.0-38.9, adult: Secondary | ICD-10-CM | POA: Insufficient documentation

## 2019-07-24 DIAGNOSIS — I5032 Chronic diastolic (congestive) heart failure: Secondary | ICD-10-CM | POA: Diagnosis not present

## 2019-07-24 DIAGNOSIS — I2510 Atherosclerotic heart disease of native coronary artery without angina pectoris: Secondary | ICD-10-CM

## 2019-07-24 DIAGNOSIS — R9439 Abnormal result of other cardiovascular function study: Secondary | ICD-10-CM

## 2019-07-24 DIAGNOSIS — R609 Edema, unspecified: Secondary | ICD-10-CM | POA: Insufficient documentation

## 2019-07-24 DIAGNOSIS — Z7982 Long term (current) use of aspirin: Secondary | ICD-10-CM | POA: Diagnosis not present

## 2019-07-24 DIAGNOSIS — I493 Ventricular premature depolarization: Secondary | ICD-10-CM | POA: Diagnosis not present

## 2019-07-24 DIAGNOSIS — E669 Obesity, unspecified: Secondary | ICD-10-CM | POA: Diagnosis not present

## 2019-07-24 DIAGNOSIS — N183 Chronic kidney disease, stage 3 unspecified: Secondary | ICD-10-CM | POA: Diagnosis not present

## 2019-07-24 DIAGNOSIS — Z7902 Long term (current) use of antithrombotics/antiplatelets: Secondary | ICD-10-CM | POA: Insufficient documentation

## 2019-07-24 HISTORY — PX: LEFT HEART CATH AND CORONARY ANGIOGRAPHY: CATH118249

## 2019-07-24 LAB — BASIC METABOLIC PANEL
Anion gap: 12 (ref 5–15)
BUN: 21 mg/dL (ref 8–23)
CO2: 22 mmol/L (ref 22–32)
Calcium: 8.7 mg/dL — ABNORMAL LOW (ref 8.9–10.3)
Chloride: 106 mmol/L (ref 98–111)
Creatinine, Ser: 1.16 mg/dL — ABNORMAL HIGH (ref 0.44–1.00)
GFR calc Af Amer: 55 mL/min — ABNORMAL LOW (ref >60)
GFR calc non Af Amer: 47 mL/min — ABNORMAL LOW (ref >60)
Glucose, Bld: 110 mg/dL — ABNORMAL HIGH (ref 70–99)
Potassium: 4.8 mmol/L (ref 3.5–5.1)
Sodium: 140 mmol/L (ref 135–145)

## 2019-07-24 SURGERY — LEFT HEART CATH AND CORONARY ANGIOGRAPHY
Anesthesia: LOCAL

## 2019-07-24 MED ORDER — SODIUM CHLORIDE 0.9 % WEIGHT BASED INFUSION
3.0000 mL/kg/h | INTRAVENOUS | Status: AC
  Administered 2019-07-24: 3 mL/kg/h via INTRAVENOUS

## 2019-07-24 MED ORDER — VERAPAMIL HCL 2.5 MG/ML IV SOLN
INTRAVENOUS | Status: AC
  Filled 2019-07-24: qty 2

## 2019-07-24 MED ORDER — ONDANSETRON HCL 4 MG/2ML IJ SOLN: 4.0000 mg | Freq: Four times a day (QID) | INTRAMUSCULAR | Status: DC | PRN

## 2019-07-24 MED ORDER — ASPIRIN 81 MG PO CHEW: 81.0000 mg | CHEWABLE_TABLET | ORAL | Status: DC

## 2019-07-24 MED ORDER — HEPARIN SODIUM (PORCINE) 1000 UNIT/ML IJ SOLN
INTRAMUSCULAR | Status: DC | PRN
  Administered 2019-07-24: 5000 [IU] via INTRAVENOUS

## 2019-07-24 MED ORDER — MIDAZOLAM HCL 2 MG/2ML IJ SOLN
INTRAMUSCULAR | Status: DC | PRN
  Administered 2019-07-24 (×2): 1 mg via INTRAVENOUS

## 2019-07-24 MED ORDER — SODIUM CHLORIDE 0.9% FLUSH: 3.0000 mL | INTRAVENOUS | Status: DC | PRN

## 2019-07-24 MED ORDER — LIDOCAINE HCL (PF) 1 % IJ SOLN
INTRAMUSCULAR | Status: DC | PRN
  Administered 2019-07-24: 5 mL via INTRADERMAL

## 2019-07-24 MED ORDER — SODIUM CHLORIDE 0.9 % IV SOLN: 250.0000 mL | INTRAVENOUS | Status: DC | PRN

## 2019-07-24 MED ORDER — HYDRALAZINE HCL 20 MG/ML IJ SOLN: 10.0000 mg | INTRAMUSCULAR | Status: DC | PRN

## 2019-07-24 MED ORDER — HEPARIN (PORCINE) IN NACL 1000-0.9 UT/500ML-% IV SOLN
INTRAVENOUS | Status: AC
  Filled 2019-07-24: qty 1000

## 2019-07-24 MED ORDER — IOHEXOL 350 MG/ML SOLN
INTRAVENOUS | Status: DC | PRN
  Administered 2019-07-24: 75 mL via INTRA_ARTERIAL

## 2019-07-24 MED ORDER — VERAPAMIL HCL 2.5 MG/ML IV SOLN
INTRAVENOUS | Status: DC | PRN
  Administered 2019-07-24: 15:00:00 10 mL via INTRA_ARTERIAL

## 2019-07-24 MED ORDER — ACETAMINOPHEN 325 MG PO TABS: 650.0000 mg | ORAL_TABLET | ORAL | Status: DC | PRN

## 2019-07-24 MED ORDER — HEPARIN (PORCINE) IN NACL 1000-0.9 UT/500ML-% IV SOLN
INTRAVENOUS | Status: DC | PRN
  Administered 2019-07-24 (×2): 500 mL

## 2019-07-24 MED ORDER — MIDAZOLAM HCL 2 MG/2ML IJ SOLN
INTRAMUSCULAR | Status: AC
  Filled 2019-07-24: qty 2

## 2019-07-24 MED ORDER — LIDOCAINE HCL (PF) 1 % IJ SOLN
INTRAMUSCULAR | Status: AC
  Filled 2019-07-24: qty 30

## 2019-07-24 MED ORDER — SODIUM CHLORIDE 0.9 % IV SOLN: INTRAVENOUS | Status: AC

## 2019-07-24 MED ORDER — HEPARIN SODIUM (PORCINE) 1000 UNIT/ML IJ SOLN
INTRAMUSCULAR | Status: AC
  Filled 2019-07-24: qty 1

## 2019-07-24 MED ORDER — SODIUM CHLORIDE 0.9 % WEIGHT BASED INFUSION: 1.0000 mL/kg/h | INTRAVENOUS | Status: DC

## 2019-07-24 MED ORDER — FENTANYL CITRATE (PF) 100 MCG/2ML IJ SOLN
INTRAMUSCULAR | Status: DC | PRN
  Administered 2019-07-24 (×2): 25 ug via INTRAVENOUS

## 2019-07-24 MED ORDER — SODIUM CHLORIDE 0.9% FLUSH: 3.0000 mL | Freq: Two times a day (BID) | INTRAVENOUS | Status: DC

## 2019-07-24 MED ORDER — FENTANYL CITRATE (PF) 100 MCG/2ML IJ SOLN
INTRAMUSCULAR | Status: AC
  Filled 2019-07-24: qty 2

## 2019-07-24 MED ORDER — LABETALOL HCL 5 MG/ML IV SOLN: 10.0000 mg | INTRAVENOUS | Status: DC | PRN

## 2019-07-24 SURGICAL SUPPLY — 10 items
CATH 5FR JL3.5 JR4 ANG PIG MP (CATHETERS) ×2 IMPLANT
DEVICE RAD COMP TR BAND LRG (VASCULAR PRODUCTS) ×2 IMPLANT
GLIDESHEATH SLEND SS 6F .021 (SHEATH) ×2 IMPLANT
GUIDEWIRE INQWIRE 1.5J.035X260 (WIRE) ×1 IMPLANT
INQWIRE 1.5J .035X260CM (WIRE) ×2
KIT HEART LEFT (KITS) ×2 IMPLANT
PACK CARDIAC CATHETERIZATION (CUSTOM PROCEDURE TRAY) ×2 IMPLANT
TRANSDUCER W/STOPCOCK (MISCELLANEOUS) ×2 IMPLANT
TUBING CIL FLEX 10 FLL-RA (TUBING) ×2 IMPLANT
WIRE HI TORQ VERSACORE-J 145CM (WIRE) ×2 IMPLANT

## 2019-07-24 NOTE — Progress Notes (Signed)
Ambulated to bathroom to void tol well  

## 2019-07-24 NOTE — Interval H&P Note (Signed)
History and Physical Interval Note:  07/24/2019 1:24 PM  Tracy [name]  has presented today for surgery, with the diagnosis of unstable angina. The various methods of treatment have been discussed with the patient and family. After consideration of risks, benefits and other options for treatment, the patient has consented to  Procedure(s): LEFT HEART CATH AND CORONARY ANGIOGRAPHY (N/A) as a surgical intervention.  The patient's history has been reviewed, patient examined, no change in status, stable for surgery.  I have reviewed the patient's chart and labs.  Questions were answered to the patient's satisfaction.    Cath Lab Visit (complete for each Cath Lab visit)  Clinical Evaluation Leading to the Procedure:   ACS: No.  Non-ACS:    Anginal Classification: CCS III  Anti-ischemic medical therapy: Minimal Therapy (1 class of medications)  Non-Invasive Test Results: High-risk stress test findings: cardiac mortality >3%/year  Prior CABG: No previous CABG         Lauree Chandler

## 2019-07-24 NOTE — Discharge Instructions (Signed)
Radial Site Care ° °This sheet gives you information about how to care for yourself after your procedure. Your health care provider may also give you more specific instructions. If you have problems or questions, contact your health care provider. °What can I expect after the procedure? °After the procedure, it is common to have: °· Bruising and tenderness at the catheter insertion area. °Follow these instructions at home: °Medicines °· Take over-the-counter and prescription medicines only as told by your health care provider. °Insertion site care °· Follow instructions from your health care provider about how to take care of your insertion site. Make sure you: °? Wash your hands with soap and water before you change your bandage (dressing). If soap and water are not available, use hand sanitizer. °? Change your dressing as told by your health care provider. °? Leave stitches (sutures), skin glue, or adhesive strips in place. These skin closures may need to stay in place for 2 weeks or longer. If adhesive strip edges start to loosen and curl up, you may trim the loose edges. Do not remove adhesive strips completely unless your health care provider tells you to do that. °· Check your insertion site every day for signs of infection. Check for: °? Redness, swelling, or pain. °? Fluid or blood. °? Pus or a bad smell. °? Warmth. °· Do not take baths, swim, or use a hot tub until your health care provider approves. °· You may shower 24-48 hours after the procedure, or as directed by your health care provider. °? Remove the dressing and gently wash the site with plain soap and water. °? Pat the area dry with a clean towel. °? Do not rub the site. That could cause bleeding. °· Do not apply powder or lotion to the site. °Activity ° °· For 24 hours after the procedure, or as directed by your health care provider: °? Do not flex or bend the affected arm. °? Do not push or pull heavy objects with the affected arm. °? Do not  drive yourself home from the hospital or clinic. You may drive 24 hours after the procedure unless your health care provider tells you not to. °? Do not operate machinery or power tools. °· Do not lift anything that is heavier than 10 lb (4.5 kg), or the limit that you are told, until your health care provider says that it is safe. °· Ask your health care provider when it is okay to: °? Return to work or school. °? Resume usual physical activities or sports. °? Resume sexual activity. °General instructions °· If the catheter site starts to bleed, raise your arm and put firm pressure on the site. If the bleeding does not stop, get help right away. This is a medical emergency. °· If you went home on the same day as your procedure, a responsible adult should be with you for the first 24 hours after you arrive home. °· Keep all follow-up visits as told by your health care provider. This is important. °Contact a health care provider if: °· You have a fever. °· You have redness, swelling, or yellow drainage around your insertion site. °Get help right away if: °· You have unusual pain at the radial site. °· The catheter insertion area swells very fast. °· The insertion area is bleeding, and the bleeding does not stop when you hold steady pressure on the area. °· Your arm or hand becomes pale, cool, tingly, or numb. °These symptoms may represent a serious problem   that is an emergency. Do not wait to see if the symptoms will go away. Get medical help right away. Call your local emergency services (911 in the [place] Do not drive yourself to the hospital. °Summary °· After the procedure, it is common to have bruising and tenderness at the site. °· Follow instructions from your health care provider about how to take care of your radial site wound. Check the wound every day for signs of infection. °· Do not lift anything that is heavier than 10 lb (4.5 kg), or the limit that you are told, until your health care provider says  that it is safe. °This information is not intended to replace advice given to you by your health care provider. Make sure you discuss any questions you have with your health care provider. °Document Released: 11/05/2010 Document Revised: 11/08/2017 Document Reviewed: 11/08/2017 °Elsevier Patient Education © 2020 [employer] ° °

## 2019-07-24 NOTE — Progress Notes (Signed)
Reviewed discharge instructions with pt and her husband (via telephone) both voice understanding.

## 2019-07-25 ENCOUNTER — Encounter (HOSPITAL_COMMUNITY): Payer: Self-pay | Admitting: Cardiovascular Disease

## 2019-08-02 ENCOUNTER — Other Ambulatory Visit: Payer: Self-pay | Admitting: Family Medicine

## 2019-08-07 ENCOUNTER — Ambulatory Visit (INDEPENDENT_AMBULATORY_CARE_PROVIDER_SITE_OTHER): Payer: Medicare Other | Admitting: Cardiology

## 2019-08-07 ENCOUNTER — Encounter: Payer: Self-pay | Admitting: Cardiology

## 2019-08-07 ENCOUNTER — Other Ambulatory Visit: Payer: Self-pay

## 2019-08-07 VITALS — BP 122/78 | HR 86 | Ht 65.0 in | Wt 230.0 lb

## 2019-08-07 DIAGNOSIS — I2510 Atherosclerotic heart disease of native coronary artery without angina pectoris: Secondary | ICD-10-CM

## 2019-08-07 DIAGNOSIS — I10 Essential (primary) hypertension: Secondary | ICD-10-CM | POA: Diagnosis not present

## 2019-08-07 DIAGNOSIS — Z79899 Other long term (current) drug therapy: Secondary | ICD-10-CM | POA: Diagnosis not present

## 2019-08-07 DIAGNOSIS — E669 Obesity, unspecified: Secondary | ICD-10-CM

## 2019-08-07 DIAGNOSIS — N1831 Chronic kidney disease, stage 3a: Secondary | ICD-10-CM | POA: Diagnosis not present

## 2019-08-07 DIAGNOSIS — E785 Hyperlipidemia, unspecified: Secondary | ICD-10-CM

## 2019-08-07 DIAGNOSIS — R0602 Shortness of breath: Secondary | ICD-10-CM | POA: Diagnosis not present

## 2019-08-07 DIAGNOSIS — R609 Edema, unspecified: Secondary | ICD-10-CM

## 2019-08-07 LAB — LIPID PANEL
Chol/HDL Ratio: 4.5 ratio — ABNORMAL HIGH (ref 0.0–4.4)
Cholesterol, Total: 168 mg/dL (ref 100–199)
HDL: 37 mg/dL — ABNORMAL LOW (ref >39)
LDL Chol Calc (NIH): 95 mg/dL (ref 0–99)
Triglycerides: 209 mg/dL — ABNORMAL HIGH (ref 0–149)
VLDL Cholesterol Cal: 36 mg/dL (ref 5–40)

## 2019-08-07 LAB — BASIC METABOLIC PANEL
BUN/Creatinine Ratio: 13 (ref 12–28)
BUN: 17 mg/dL (ref 8–27)
CO2: 23 mmol/L (ref 20–29)
Calcium: 9.3 mg/dL (ref 8.7–10.3)
Chloride: 104 mmol/L (ref 96–106)
Creatinine, Ser: 1.36 mg/dL — ABNORMAL HIGH (ref 0.57–1.00)
GFR calc Af Amer: 45 mL/min/{1.73_m2} — ABNORMAL LOW (ref >59)
GFR calc non Af Amer: 39 mL/min/{1.73_m2} — ABNORMAL LOW (ref >59)
Glucose: 123 mg/dL — ABNORMAL HIGH (ref 65–99)
Potassium: 4.9 mmol/L (ref 3.5–5.2)
Sodium: 141 mmol/L (ref 134–144)

## 2019-08-07 NOTE — Patient Instructions (Addendum)
Medication Instructions:  Your physician recommends that you continue on your current medications as directed. Please refer to the Current Medication list given to you today.'  *If you need a refill on your cardiac medications before your next appointment, please call your pharmacy*  Lab Work: TODAY: LIPIDS & BMET   If you have labs (blood work) drawn today and your tests are completely normal, you will receive your results only by: Marland Kitchen MyChart Message (if you have MyChart) OR . A paper copy in the mail If you have any lab test that is abnormal or we need to change your treatment, we will call you to review the results.  Testing/Procedures: None   Follow-Up: You are scheduled to see Dr. Burt Knack on 11/18/2019 @ 8:20 AM Other Instructions  Lifestyle Modifications to Prevent and Treat Heart Disease -Recommend heart healthy/Mediterranean diet, with whole grains, fruits, vegetables, fish, lean meats, nuts, olive oil and avocado oil.  -Limit salt intake to less than 2000 mg per day.  -Recommend moderate walking, starting slowly with a few minutes and working up to 3-5 times/week for 30-50 minutes each session. Aim for at least 150 minutes.week. Goal should be pace of 3 miles/hours, or walking 1.5 miles in 30 minutes -Recommend avoidance of tobacco products. Avoid excess alcohol. -Keep blood pressure well controlled, ideally less than 130/80.      DASH Eating Plan DASH stands for "Dietary Approaches to Stop Hypertension." The DASH eating plan is a healthy eating plan that has been shown to reduce high blood pressure (hypertension). It may also reduce your risk for type 2 diabetes, heart disease, and stroke. The DASH eating plan may also help with weight loss. What are tips for following this plan?  General guidelines  Avoid eating more than 2,300 mg (milligrams) of salt (sodium) a day. If you have hypertension, you may need to reduce your sodium intake to 1,500 mg a day.  Limit alcohol  intake to no more than 1 drink a day for nonpregnant women and 2 drinks a day for men. One drink equals 12 oz of beer, 5 oz of wine, or 1 oz of hard liquor.  Work with your health care provider to maintain a healthy body weight or to lose weight. Ask what an ideal weight is for you.  Get at least 30 minutes of exercise that causes your heart to beat faster (aerobic exercise) most days of the week. Activities may include walking, swimming, or biking.  Work with your health care provider or diet and nutrition specialist (dietitian) to adjust your eating plan to your individual calorie needs. Reading food labels   Check food labels for the amount of sodium per serving. Choose foods with less than 5 percent of the Daily Value of sodium. Generally, foods with less than 300 mg of sodium per serving fit into this eating plan.  To find whole grains, look for the word "whole" as the first word in the ingredient list. Shopping  Buy products labeled as "low-sodium" or "no salt added."  Buy fresh foods. Avoid canned foods and premade or frozen meals. Cooking  Avoid adding salt when cooking. Use salt-free seasonings or herbs instead of table salt or sea salt. Check with your health care provider or pharmacist before using salt substitutes.  Do not fry foods. Cook foods using healthy methods such as baking, boiling, grilling, and broiling instead.  Cook with heart-healthy oils, such as olive, canola, soybean, or sunflower oil. Meal planning  Eat a balanced diet that  includes: ? 5 or more servings of fruits and vegetables each day. At each meal, try to fill half of your plate with fruits and vegetables. ? Up to 6-8 servings of whole grains each day. ? Less than 6 oz of lean meat, poultry, or fish each day. A 3-oz serving of meat is about the same size as a deck of cards. One egg equals 1 oz. ? 2 servings of low-fat dairy each day. ? A serving of nuts, seeds, or beans 5 times each week. ?  Heart-healthy fats. Healthy fats called Omega-3 fatty acids are found in foods such as flaxseeds and coldwater fish, like sardines, salmon, and mackerel.  Limit how much you eat of the following: ? Canned or prepackaged foods. ? Food that is high in trans fat, such as fried foods. ? Food that is high in saturated fat, such as fatty meat. ? Sweets, desserts, sugary drinks, and other foods with added sugar. ? Full-fat dairy products.  Do not salt foods before eating.  Try to eat at least 2 vegetarian meals each week.  Eat more home-cooked food and less restaurant, buffet, and fast food.  When eating at a restaurant, ask that your food be prepared with less salt or no salt, if possible. What foods are recommended? The items listed may not be a complete list. Talk with your dietitian about what dietary choices are best for you. Grains Whole-grain or whole-wheat bread. Whole-grain or whole-wheat pasta. Brown rice. [place]. Bulgur. Whole-grain and low-sodium cereals. Pita bread. Low-fat, low-sodium crackers. Whole-wheat flour tortillas. Vegetables Fresh or frozen vegetables (raw, steamed, roasted, or grilled). Low-sodium or reduced-sodium tomato and vegetable juice. Low-sodium or reduced-sodium tomato sauce and tomato paste. Low-sodium or reduced-sodium canned vegetables. Fruits All fresh, dried, or frozen fruit. Canned fruit in natural juice (without added sugar). Meat and other protein foods Skinless chicken or [place]. Ground chicken or [place]. Pork with fat trimmed off. Fish and seafood. Egg whites. Dried beans, peas, or lentils. Unsalted nuts, nut butters, and seeds. Unsalted canned beans. Lean cuts of beef with fat trimmed off. Low-sodium, lean deli meat. Dairy Low-fat (1%) or fat-free (skim) milk. Fat-free, low-fat, or reduced-fat cheeses. Nonfat, low-sodium ricotta or cottage cheese. Low-fat or nonfat yogurt. Low-fat, low-sodium cheese. Fats and oils Soft margarine without trans  fats. Vegetable oil. Low-fat, reduced-fat, or light mayonnaise and salad dressings (reduced-sodium). Canola, safflower, olive, soybean, and sunflower oils. Avocado. Seasoning and other foods Herbs. Spices. Seasoning mixes without salt. Unsalted popcorn and pretzels. Fat-free sweets. What foods are not recommended? The items listed may not be a complete list. Talk with your dietitian about what dietary choices are best for you. Grains Baked goods made with fat, such as croissants, muffins, or some breads. Dry pasta or rice meal packs. Vegetables Creamed or fried vegetables. Vegetables in a cheese sauce. Regular canned vegetables (not low-sodium or reduced-sodium). Regular canned tomato sauce and paste (not low-sodium or reduced-sodium). Regular tomato and vegetable juice (not low-sodium or reduced-sodium). Angie Fava. Olives. Fruits Canned fruit in a light or heavy syrup. Fried fruit. Fruit in cream or butter sauce. Meat and other protein foods Fatty cuts of meat. Ribs. Fried meat. Berniece Salines. Sausage. Bologna and other processed lunch meats. Salami. Fatback. Hotdogs. Bratwurst. Salted nuts and seeds. Canned beans with added salt. Canned or smoked fish. Whole eggs or egg yolks. Chicken or [place] with skin. Dairy Whole or 2% milk, cream, and half-and-half. Whole or full-fat cream cheese. Whole-fat or sweetened yogurt. Full-fat cheese. Nondairy creamers. Whipped  toppings. Processed cheese and cheese spreads. Fats and oils Butter. Stick margarine. Lard. Shortening. Ghee. Bacon fat. Tropical oils, such as coconut, palm kernel, or palm oil. Seasoning and other foods Salted popcorn and pretzels. Onion salt, garlic salt, seasoned salt, table salt, and sea salt. Worcestershire sauce. Tartar sauce. Barbecue sauce. Teriyaki sauce. Soy sauce, including reduced-sodium. Steak sauce. Canned and packaged gravies. Fish sauce. Oyster sauce. Cocktail sauce. Horseradish that you find on the shelf. Ketchup. Mustard. Meat  flavorings and tenderizers. Bouillon cubes. Hot sauce and Tabasco sauce. Premade or packaged marinades. Premade or packaged taco seasonings. Relishes. Regular salad dressings. Where to find more information:  National Heart, Lung, and [place]: https://wilson-eaton.com/  American Heart Association: www.heart.org Summary  The DASH eating plan is a healthy eating plan that has been shown to reduce high blood pressure (hypertension). It may also reduce your risk for type 2 diabetes, heart disease, and stroke.  With the DASH eating plan, you should limit salt (sodium) intake to 2,300 mg a day. If you have hypertension, you may need to reduce your sodium intake to 1,500 mg a day.  When on the DASH eating plan, aim to eat more fresh fruits and vegetables, whole grains, lean proteins, low-fat dairy, and heart-healthy fats.  Work with your health care provider or diet and nutrition specialist (dietitian) to adjust your eating plan to your individual calorie needs. This information is not intended to replace advice given to you by your health care provider. Make sure you discuss any questions you have with your health care provider. Document Released: 09/22/2011 Document Revised: 09/15/2017 Document Reviewed: 09/26/2016 Elsevier Patient Education  2020 [employer].

## 2019-08-07 NOTE — Progress Notes (Signed)
Cardiology Office Note:    Date:  08/07/2019   ID:  Tracy [name], DOB 05/22/1946, MRN [id number]  PCP:  [name], [name], MD  Cardiologist:  Sherren Mocha, MD  Referring MD: [name], [name], MD   Chief Complaint  Patient presents with   Hospitalization Follow-up    post cath    History of Present Illness:    Tracy [name] is a 73 y.o. female with a past medical history significant for   Coronary artery disease  ? S/p BMS to LCx in 2007 ? S/p DES to LCx in 04/961  Chronic diastolic CHF ? Echocardiogram 12/18: EF 50-55, Gr 1 DD  Chronic kidney disease 3  COPD  Hypertension   Hyperlipidemia   Ms. Tracy [name] was seen in the office on 07/10/2019 by Richardson Dopp, PA for evaluation of chest pain and shortness of breath.  She was noted to have chronic shortness of breath likely multifactorial related to COPD, CAD, heart failure as well as obesity.  She was scheduled for a [place] which was done on 07/17/2019 and showed a decreased EF and findings consistent with prior MI with peri-infarct ischemia.  Echocardiogram done on that day showed EF 55-60% with no significant wall motion abnormality.  The patient underwent cardiac catheterization on 07/24/2019 that showed moderate disease in the mid LAD that is unchanged from prior catheterization in 2019, nonflow limiting.  Stent in the mid circumflex artery was patent with minimal restenosis in the RCA is large with mild proximal to mid stenosis.  She was continued on medical management for CAD.  The patient is here today for hospital follow-up after cardiac cath. Right wrist cath site is healing well. She is not having the chest pains as much since the cath. Only one episode of sharp left chest pain, shot through and then was gone.   Regarding her breathing, she feels that she is not much better after seeing a pulmonologist for COPD. She thinks that the routine inhalers are not helping much and she has to use albuterol  with any activity like walking in from the parking lot today. She has not taken her lasix today due to coming to the office.   She continues to have swelling in the left leg that has been present since July. She says that it has gone down a little bit. She had a course of antibiotics which helped. She has burning of the anterior lower leg. She was advised to take horse chestnut seed extract and use compression stockings. She finished the extract and is not taking it now as she read that it is not good to take for long periods of time. She was unable to wear compression stocking as it made her pain worse. She takes her Lasix twice a day but often misses the second dose when she has to go out.  Cardiac studies   LEFT HEART CATH AND CORONARY ANGIOGRAPHY 07/24/2019  Conclusion   Ost LAD lesion is 30% stenosed.  Prox LAD to Mid LAD lesion is 50% stenosed.  Mid LAD lesion is 60% stenosed.  Mid LAD to Dist LAD lesion is 40% stenosed.  Ost Cx lesion is 25% stenosed.  Prox RCA to Mid RCA lesion is 25% stenosed.  Prox Cx to Mid Cx lesion is 10% stenosed.   1. The left main is patent with no obstructive disease.  2. The LAD is a large caliber vessel that courses to the apex. The mid LAD has moderate disease that is  unchanged from the catheterization in 2019. The lesions in the mid LAD do not appear to be flow limiting.  3. The stent in the mid Circumflex artery is patent with minimal restenosis 4. The RCA is a large dominant vessel with mild proximal to mid stenosis.   Recommendations: Continue medical management of CAD    Lexiscan Myoview 07/17/2019 Study Highlights   Nuclear stress EF: 32%.  There was no ST segment deviation noted during stress.  No T wave inversion was noted during stress.  Defect 1: There is a large defect of moderate severity present in the basal inferior, basal inferolateral, basal anterolateral, mid inferolateral, mid anterolateral and apical lateral  location.  Defect 2: There is a small defect of moderate severity present in the basal anterior, basal anteroseptal and basal inferoseptal location.  Findings consistent with prior myocardial infarction with peri-infarct ischemia.  This is a high risk study.  The left ventricular ejection fraction is moderately decreased (30-44%).   There is a large moderate severity defect in the lateral walls extending to basal inferior wall, consistent with infarct. There is a second defect at the base from the anterior wall to septum more consistent with ischemia. EF is significantly reduced, and all walls except septal walls appear hypokinetic at rest. Defects slightly worse than prior study.   Echocardiogram 07/17/2019 IMPRESSIONS  1. Left ventricular ejection fraction, by visual estimation, is 55 to 60%. The left ventricle has normal function. Normal left ventricular size. There is no left ventricular hypertrophy.  2. Elevated left ventricular end-diastolic pressure.  3. Left ventricular diastolic Doppler parameters are consistent with impaired relaxation pattern of LV diastolic filling.  4. Global right ventricle has normal systolic function.The right ventricular size is normal. No increase in right ventricular wall thickness.  5. Left atrial size was normal.  6. Right atrial size was normal.  7. Mild mitral annular calcification.  8. The mitral valve is normal in structure. Mild mitral valve regurgitation. No evidence of mitral stenosis.  9. The tricuspid valve is normal in structure. Tricuspid valve regurgitation is trivial. 10. The aortic valve is grossly normal Aortic valve regurgitation was not visualized by color flow Doppler. Structurally normal aortic valve, with no evidence of sclerosis or stenosis. 11. The pulmonic valve was normal in structure. Pulmonic valve regurgitation is not visualized by color flow Doppler. 12. Normal pulmonary artery systolic pressure. 13. The inferior vena cava is  normal in size with greater than 50% respiratory variability, suggesting right atrial pressure of 3 mmHg.   Past Medical History:  Diagnosis Date   Allergic rhinitis    CAD (coronary artery disease)    1/19 PCI/DES to [place] for ISR, normal EF.    CHF (congestive heart failure) ([place])    Echo 06/2019: EF 55-60, elevated LVEDP, normal RV SF, mild MAC, mild MR, trivial TR   Gout    Headache(784.0)    Hyperlipidemia    Hypertension    Low back pain    Menopausal syndrome    Myocardial infarct ([place]) 2007   hx of   Overactive bladder     Past Surgical History:  Procedure Laterality Date   ANGIOPLASTY     stent 2007   CARDIAC CATHETERIZATION     COLONOSCOPY     CORONARY STENT INTERVENTION N/A 11/06/2017   Procedure: CORONARY STENT INTERVENTION;  Surgeon: [name], MD;  Location: [facility];  Service: Cardiovascular;  Laterality: N/A;   LEFT HEART CATH AND CORONARY ANGIOGRAPHY N/A 11/06/2017  Procedure: LEFT HEART CATH AND CORONARY ANGIOGRAPHY;  Surgeon: [name], MD;  Location: [facility];  Service: Cardiovascular;  Laterality: N/A;   LEFT HEART CATH AND CORONARY ANGIOGRAPHY N/A 07/24/2019   Procedure: LEFT HEART CATH AND CORONARY ANGIOGRAPHY;  Surgeon: Burnell Blanks, MD;  Location: [facility];  Service: Cardiovascular;  Laterality: N/A;   TONSILLECTOMY      Current Medications: Current Meds  Medication Sig   albuterol (PROVENTIL HFA;VENTOLIN HFA) 108 (90 Base) MCG/ACT inhaler Inhale 2 puffs into the lungs every 4 (four) hours as needed for wheezing or shortness of breath.   albuterol (PROVENTIL) (2.5 MG/3ML) 0.083% nebulizer solution Take 3 mLs (2.5 mg total) by nebulization every 6 (six) hours as needed for wheezing or shortness of breath.   allopurinol (ZYLOPRIM) 100 MG tablet TAKE 1 TABLET BY MOUTH EVERY DAY   ALPRAZolam (XANAX) 0.25 MG tablet TAKE 1 TABLET TWICE A DAY AS NEEDED FOR ANXIETY   aspirin EC 81 MG  tablet Take 81 mg by mouth daily.   atorvastatin (LIPITOR) 40 MG tablet TAKE 1 TABLET DAILY BY MOUTH. PLEASE KEEP UPCOMING APPT FOR FUTURE REFILLS. THANKS (Patient taking differently: Take 40 mg by mouth every evening. )   calcium carbonate (CALCIUM 600) 600 MG TABS tablet Take 600 mg by mouth daily with breakfast.    cetirizine (ZYRTEC) 10 MG tablet Take 10 mg by mouth daily.   Cholecalciferol (VITAMIN D3 PO) Take 4,000 Units by mouth daily.    clopidogrel (PLAVIX) 75 MG tablet TAKE 1 TABLET (75 MG TOTAL) BY MOUTH DAILY WITH BREAKFAST.   Cyanocobalamin (VITAMIN B-12) 5000 MCG SUBL Place 5,000 mcg under the tongue daily.    diclofenac sodium (VOLTAREN) 1 % GEL Apply 4 g topically 4 (four) times daily.   fluticasone (FLONASE) 50 MCG/ACT nasal spray Place 2 sprays into both nostrils daily.   furosemide (LASIX) 40 MG tablet Take 1 tablet (40 mg total) by mouth daily. (Patient taking differently: Take 40 mg by mouth 2 (two) times daily. )   Garlic 6803 MG CAPS Take 2,000 mg by mouth daily.    HYDROcodone-acetaminophen (NORCO/VICODIN) 5-325 MG tablet Take 1 tablet by mouth daily as needed for moderate pain.   losartan (COZAAR) 100 MG tablet TAKE 1 TABLET BY MOUTH EVERY DAY (Patient taking differently: Take 100 mg by mouth daily. )   metoprolol tartrate (LOPRESSOR) 100 MG tablet TAKE 1 TABLET (100MG) BY MOUTH IN THE MORNING AND ONE-HALF TABLET (50MG) AT BEDTIME   Misc Natural Products (TART CHERRY ADVANCED) CAPS Take 1,000 capsules by mouth daily.    nitroGLYCERIN (NITROSTAT) 0.4 MG SL tablet PLACE 1 TABLET (0.4 MG TOTAL) UNDER THE TONGUE EVERY 5 (FIVE) MINUTES AS NEEDED FOR CHEST PAIN.   OVER THE COUNTER MEDICATION Take 400 mg by mouth daily. Horse chestnut extract    pantoprazole (PROTONIX) 20 MG tablet Take 1 tablet (20 mg total) by mouth daily.   potassium chloride SA (KLOR-CON M20) 20 MEQ tablet Take 1 tablet (20 mEq total) by mouth 2 (two) times daily.   umeclidinium-vilanterol  (ANORO ELLIPTA) 62.5-25 MCG/INH AEPB Inhale 1 puff into the lungs daily.   Vitamin E 180 MG CAPS Take 180 Units by mouth daily.      Allergies:   Erythromycin and Sulfamethoxazole   Social History   Socioeconomic History   Marital status: Married    Spouse name: Not on file   Number of children: Not on file   Years of education: Not on  file   Highest education level: Not on file  Occupational History   Occupation: direct sales time Research officer, political party strain: Not on file   Food insecurity    Worry: Not on file    Inability: Not on file   Transportation needs    Medical: No    Non-medical: No  Tobacco Use   Smoking status: Former Smoker    Packs/day: 0.20    Years: 50.00    Pack years: 10.00    Quit date: 07/01/2012    Years since quitting: 7.1   Smokeless tobacco: Never Used   Tobacco comment: completely quit May of 2018; period of years she did not smoke   Substance and Sexual Activity   Alcohol use: No   Drug use: No   Sexual activity: Not on file  Lifestyle   Physical activity    Days per week: 0 days    Minutes per session: 0 min   Stress: Only a little  Relationships   Press photographer on phone: Not on file    Gets together: Not on file    Attends religious service: Not on file    Active member of club or organization: Not on file    Attends meetings of clubs or organizations: Not on file    Relationship status: Not on file  Other Topics Concern   Not on file  Social History Narrative   Not on file     Family History: The patient's family history includes Colon cancer (age of onset: 41) in her mother; Other in an other family member. There is no history of Stomach cancer. She was adopted. ROS:   Please see the history of present illness.     All other systems reviewed and are negative.   EKG:  EKG is not ordered today.   Recent Labs: 07/10/2019: Hemoglobin 12.9; Magnesium 1.9; NT-Pro BNP  435; Platelets 288 07/24/2019: BUN 21; Creatinine, Ser 1.16; Potassium 4.8; Sodium 140   Recent Lipid Panel    Component Value Date/Time   CHOL 154 09/13/2016 0738   TRIG 175 (H) 09/13/2016 0738   HDL 31 (L) 09/13/2016 0738   CHOLHDL 5.0 (H) 09/13/2016 0738   VLDL 35 (H) 09/13/2016 0738   LDLCALC 88 09/13/2016 0738   LDLDIRECT 111.0 01/05/2015 1019    Physical Exam:    VS:  BP 122/78    Pulse 86    Ht _0  (1.651 m)    Wt 230 lb (104.3 kg)    SpO2 95%    BMI 38.27 kg/m     Wt Readings from Last 6 Encounters:  08/07/19 230 lb (104.3 kg)  07/24/19 231 lb (104.8 kg)  07/17/19 231 lb (104.8 kg)  07/11/19 231 lb (104.8 kg)  07/10/19 231 lb (104.8 kg)  06/13/19 225 lb (102.1 kg)     Physical Exam  Constitutional: She is oriented to person, place, and time. She appears well-developed and well-nourished. No distress.  HENT:  Head: Normocephalic and atraumatic.  Neck: Normal range of motion. Neck supple. No JVD present.  Cardiovascular: Normal rate, regular rhythm, normal heart sounds and intact distal pulses. Exam reveals no gallop and no friction rub.  No murmur heard. Pulmonary/Chest: Effort normal and breath sounds normal. No respiratory distress. She has no wheezes. She has no rales.  Abdominal: Soft. Bowel sounds are normal.  Musculoskeletal:        General: Edema present.  Comments: Left leg 1+ edema, mildly pink but no frank redness.  Possibly mildly warmer than right  Neurological: She is alert and oriented to person, place, and time.  Skin: Skin is warm and dry.  Psychiatric: She has a normal mood and affect. Her behavior is normal. Judgment and thought content normal.  Vitals reviewed.   ASSESSMENT:    1. Coronary artery disease without angina pectoris   2. Shortness of breath   3. Edema, unspecified type   4. Stage 3a chronic kidney disease   5. Essential (primary) hypertension   6. Hyperlipidemia, unspecified hyperlipidemia type   7. Medication management    8. Obesity (BMI 35.0-39.9 without comorbidity)    PLAN:    In order of problems listed above:  CAD -Patient with recent chest discomfort and shortness of breath concerning for angina.  She underwent nuclear stress test which was abnormal followed by cardiac catheterization that showed no significant stenoses.  Continues on medical therapy. -Continue aspirin, statin, Plavix, beta-blocker  Shortness of breath -Likely multifactorial related to COPD, CAD and heart failure as well as obesity.  Recent cath was okay. -Patient is seeing a pulmonologist and has started inhalers which she does not think are helping much.  Left leg swelling -Left leg negative for DVT by ultrasound. -Patient had a trial of increased Lasix which helped a little. She has also had a shortn course of antibiotic for possilble cellulitis. I think she needs it reassessed by her PCP for possible continued mild cellulitis. Left leg is more edematorus, mild pinkness (not frankly red), possibly mildly warmer than right.  -Advised the patient to see her PCP again regarding possible continued mild cellulitis. -Patient states that she has reduced her sodium intake.  Will provide information on DASH diet.  CKD stage III -Serum creatinine had improved after recent increase in Lasix for swelling and shortness of breath, 1.16. -Since patient has been taking Lasix twice a day, I will check a repeat metabolic panel today.  Hypertension -BP is well controlled.   Hyperlipidemia -On high intensity statin with atorvastatin 40 mg daily -Will update lipids  Obesity -Body mass index is 38.27 kg/m. -We discussed focus on weight loss.  She says that her diet is somewhat limited due to her gout flares and having to avoid certain foods.  Advised her to try to at least lose 10-20 pounds.  Medication Adjustments/Labs and Tests Ordered: Current medicines are reviewed at length with the patient today.  Concerns regarding medicines are  outlined above. Labs and tests ordered and medication changes are outlined in the patient instructions below:  Patient Instructions  Medication Instructions:  Your physician recommends that you continue on your current medications as directed. Please refer to the Current Medication list given to you today.'  *If you need a refill on your cardiac medications before your next appointment, please call your pharmacy*  Lab Work: TODAY: LIPIDS & BMET   If you have labs (blood work) drawn today and your tests are completely normal, you will receive your results only by:  [facility] (if you have MyChart) OR  A paper copy in the mail If you have any lab test that is abnormal or we need to change your treatment, we will call you to review the results.  Testing/Procedures: None   Follow-Up: You are scheduled to see Dr. Burt Knack on 11/18/2019 @ 8:20 AM Other Instructions  Lifestyle Modifications to Prevent and Treat Heart Disease -Recommend heart healthy/Mediterranean diet, with whole grains, fruits, vegetables,  fish, lean meats, nuts, olive oil and avocado oil.  -Limit salt intake to less than 2000 mg per day.  -Recommend moderate walking, starting slowly with a few minutes and working up to 3-5 times/week for 30-50 minutes each session. Aim for at least 150 minutes.week. Goal should be pace of 3 miles/hours, or walking 1.5 miles in 30 minutes -Recommend avoidance of tobacco products. Avoid excess alcohol. -Keep blood pressure well controlled, ideally less than 130/80.      DASH Eating Plan DASH stands for "Dietary Approaches to Stop Hypertension." The DASH eating plan is a healthy eating plan that has been shown to reduce high blood pressure (hypertension). It may also reduce your risk for type 2 diabetes, heart disease, and stroke. The DASH eating plan may also help with weight loss. What are tips for following this plan?  General guidelines  Avoid eating more than 2,300 mg  (milligrams) of salt (sodium) a day. If you have hypertension, you may need to reduce your sodium intake to 1,500 mg a day.  Limit alcohol intake to no more than 1 drink a day for nonpregnant women and 2 drinks a day for men. One drink equals 12 oz of beer, 5 oz of wine, or 1 oz of hard liquor.  Work with your health care provider to maintain a healthy body weight or to lose weight. Ask what an ideal weight is for you.  Get at least 30 minutes of exercise that causes your heart to beat faster (aerobic exercise) most days of the week. Activities may include walking, swimming, or biking.  Work with your health care provider or diet and nutrition specialist (dietitian) to adjust your eating plan to your individual calorie needs. Reading food labels   Check food labels for the amount of sodium per serving. Choose foods with less than 5 percent of the Daily Value of sodium. Generally, foods with less than 300 mg of sodium per serving fit into this eating plan.  To find whole grains, look for the word "whole" as the first word in the ingredient list. Shopping  Buy products labeled as "low-sodium" or "no salt added."  Buy fresh foods. Avoid canned foods and premade or frozen meals. Cooking  Avoid adding salt when cooking. Use salt-free seasonings or herbs instead of table salt or sea salt. Check with your health care provider or pharmacist before using salt substitutes.  Do not fry foods. Cook foods using healthy methods such as baking, boiling, grilling, and broiling instead.  Cook with heart-healthy oils, such as olive, canola, soybean, or sunflower oil. Meal planning  Eat a balanced diet that includes: ? 5 or more servings of fruits and vegetables each day. At each meal, try to fill half of your plate with fruits and vegetables. ? Up to 6-8 servings of whole grains each day. ? Less than 6 oz of lean meat, poultry, or fish each day. A 3-oz serving of meat is about the same size as a deck  of cards. One egg equals 1 oz. ? 2 servings of low-fat dairy each day. ? A serving of nuts, seeds, or beans 5 times each week. ? Heart-healthy fats. Healthy fats called Omega-3 fatty acids are found in foods such as flaxseeds and coldwater fish, like sardines, salmon, and mackerel.  Limit how much you eat of the following: ? Canned or prepackaged foods. ? Food that is high in trans fat, such as fried foods. ? Food that is high in saturated fat, such as fatty  meat. ? Sweets, desserts, sugary drinks, and other foods with added sugar. ? Full-fat dairy products.  Do not salt foods before eating.  Try to eat at least 2 vegetarian meals each week.  Eat more home-cooked food and less restaurant, buffet, and fast food.  When eating at a restaurant, ask that your food be prepared with less salt or no salt, if possible. What foods are recommended? The items listed may not be a complete list. Talk with your dietitian about what dietary choices are best for you. Grains Whole-grain or whole-wheat bread. Whole-grain or whole-wheat pasta. Brown rice. [place]. Bulgur. Whole-grain and low-sodium cereals. Pita bread. Low-fat, low-sodium crackers. Whole-wheat flour tortillas. Vegetables Fresh or frozen vegetables (raw, steamed, roasted, or grilled). Low-sodium or reduced-sodium tomato and vegetable juice. Low-sodium or reduced-sodium tomato sauce and tomato paste. Low-sodium or reduced-sodium canned vegetables. Fruits All fresh, dried, or frozen fruit. Canned fruit in natural juice (without added sugar). Meat and other protein foods Skinless chicken or [place]. Ground chicken or [place]. Pork with fat trimmed off. Fish and seafood. Egg whites. Dried beans, peas, or lentils. Unsalted nuts, nut butters, and seeds. Unsalted canned beans. Lean cuts of beef with fat trimmed off. Low-sodium, lean deli meat. Dairy Low-fat (1%) or fat-free (skim) milk. Fat-free, low-fat, or reduced-fat cheeses. Nonfat,  low-sodium ricotta or cottage cheese. Low-fat or nonfat yogurt. Low-fat, low-sodium cheese. Fats and oils Soft margarine without trans fats. Vegetable oil. Low-fat, reduced-fat, or light mayonnaise and salad dressings (reduced-sodium). Canola, safflower, olive, soybean, and sunflower oils. Avocado. Seasoning and other foods Herbs. Spices. Seasoning mixes without salt. Unsalted popcorn and pretzels. Fat-free sweets. What foods are not recommended? The items listed may not be a complete list. Talk with your dietitian about what dietary choices are best for you. Grains Baked goods made with fat, such as croissants, muffins, or some breads. Dry pasta or rice meal packs. Vegetables Creamed or fried vegetables. Vegetables in a cheese sauce. Regular canned vegetables (not low-sodium or reduced-sodium). Regular canned tomato sauce and paste (not low-sodium or reduced-sodium). Regular tomato and vegetable juice (not low-sodium or reduced-sodium). Angie Fava. Olives. Fruits Canned fruit in a light or heavy syrup. Fried fruit. Fruit in cream or butter sauce. Meat and other protein foods Fatty cuts of meat. Ribs. Fried meat. Berniece Salines. Sausage. Bologna and other processed lunch meats. Salami. Fatback. Hotdogs. Bratwurst. Salted nuts and seeds. Canned beans with added salt. Canned or smoked fish. Whole eggs or egg yolks. Chicken or [place] with skin. Dairy Whole or 2% milk, cream, and half-and-half. Whole or full-fat cream cheese. Whole-fat or sweetened yogurt. Full-fat cheese. Nondairy creamers. Whipped toppings. Processed cheese and cheese spreads. Fats and oils Butter. Stick margarine. Lard. Shortening. Ghee. Bacon fat. Tropical oils, such as coconut, palm kernel, or palm oil. Seasoning and other foods Salted popcorn and pretzels. Onion salt, garlic salt, seasoned salt, table salt, and sea salt. Worcestershire sauce. Tartar sauce. Barbecue sauce. Teriyaki sauce. Soy sauce, including reduced-sodium. Steak sauce.  Canned and packaged gravies. Fish sauce. Oyster sauce. Cocktail sauce. Horseradish that you find on the shelf. Ketchup. Mustard. Meat flavorings and tenderizers. Bouillon cubes. Hot sauce and Tabasco sauce. Premade or packaged marinades. Premade or packaged taco seasonings. Relishes. Regular salad dressings. Where to find more information:  National Heart, Lung, and [place]: https://wilson-eaton.com/  American Heart Association: www.heart.org Summary  The DASH eating plan is a healthy eating plan that has been shown to reduce high blood pressure (hypertension). It may also reduce your risk for type  2 diabetes, heart disease, and stroke.  With the DASH eating plan, you should limit salt (sodium) intake to 2,300 mg a day. If you have hypertension, you may need to reduce your sodium intake to 1,500 mg a day.  When on the DASH eating plan, aim to eat more fresh fruits and vegetables, whole grains, lean proteins, low-fat dairy, and heart-healthy fats.  Work with your health care provider or diet and nutrition specialist (dietitian) to adjust your eating plan to your individual calorie needs. This information is not intended to replace advice given to you by your health care provider. Make sure you discuss any questions you have with your health care provider. Document Released: 09/22/2011 Document Revised: 09/15/2017 Document Reviewed: 09/26/2016 Elsevier Patient Education  2020 Kathleen, Daune Perch, NP  08/07/2019 10:49 AM    [place]

## 2019-08-08 ENCOUNTER — Other Ambulatory Visit: Payer: Self-pay | Admitting: Family Medicine

## 2019-08-08 DIAGNOSIS — Z1231 Encounter for screening mammogram for malignant neoplasm of breast: Secondary | ICD-10-CM

## 2019-08-10 ENCOUNTER — Other Ambulatory Visit: Payer: Self-pay | Admitting: Emergency Medicine

## 2019-08-14 ENCOUNTER — Telehealth: Payer: Self-pay | Admitting: Family Medicine

## 2019-08-14 NOTE — Telephone Encounter (Signed)
Patient calling and would like the allopurinol (ZYLOPRIM) 100 MG tablet  To be a 90 day supply if possible. States that it is cheaper for her to do it that way and she doesn't have to keep going back to the pharmacy. Please advise.  [id number] - [place], [place]

## 2019-08-19 MED ORDER — ALLOPURINOL 100 MG PO TABS: 100.0000 mg | ORAL_TABLET | Freq: Every day | ORAL | 0 refills | Status: DC

## 2019-08-19 NOTE — Telephone Encounter (Signed)
Rx has been sent in. Nothing further needed. 

## 2019-09-11 ENCOUNTER — Telehealth: Payer: Self-pay | Admitting: Family Medicine

## 2019-09-11 DIAGNOSIS — M1711 Unilateral primary osteoarthritis, right knee: Secondary | ICD-10-CM | POA: Diagnosis not present

## 2019-09-11 DIAGNOSIS — M1712 Unilateral primary osteoarthritis, left knee: Secondary | ICD-10-CM | POA: Diagnosis not present

## 2019-09-11 DIAGNOSIS — M25562 Pain in left knee: Secondary | ICD-10-CM | POA: Diagnosis not present

## 2019-09-11 DIAGNOSIS — M25561 Pain in right knee: Secondary | ICD-10-CM | POA: Diagnosis not present

## 2019-09-11 DIAGNOSIS — M170 Bilateral primary osteoarthritis of knee: Secondary | ICD-10-CM | POA: Diagnosis not present

## 2019-09-11 DIAGNOSIS — Z6841 Body Mass Index (BMI) 40.0 and over, adult: Secondary | ICD-10-CM | POA: Diagnosis not present

## 2019-09-11 NOTE — Telephone Encounter (Signed)
Please advise. The patient has had both Pneumonia vaccines.

## 2019-09-11 NOTE — Telephone Encounter (Signed)
Patient called and would like to know when she is due for a pneumonia vaccine again. Please call patient back, thanks.

## 2019-09-16 ENCOUNTER — Other Ambulatory Visit: Payer: Self-pay | Admitting: Physician Assistant

## 2019-09-16 NOTE — Telephone Encounter (Signed)
Pt stated she called pharmacy and they aren't sure what pneumonia shot to give pt. Pt is requesting to have a callback on mobile number. Please advise.

## 2019-09-16 NOTE — Telephone Encounter (Signed)
It is fine, I am not sure about insurance coverage though.  Pneumovax can be given here in the office or she can get it at her pharmacy.  Thanks, BJ

## 2019-09-16 NOTE — Telephone Encounter (Signed)
Patient is aware. Nothing further needed.  

## 2019-09-17 NOTE — Telephone Encounter (Signed)
Spoke with patient. She is aware that is the pneumonia 23 vaccine that she should get.

## 2019-10-01 ENCOUNTER — Ambulatory Visit: Payer: Medicare Other

## 2019-10-07 ENCOUNTER — Other Ambulatory Visit: Payer: Self-pay | Admitting: Physician Assistant

## 2019-10-16 ENCOUNTER — Ambulatory Visit: Payer: Medicare Other | Admitting: Emergency Medicine

## 2019-10-28 ENCOUNTER — Other Ambulatory Visit: Payer: Self-pay

## 2019-10-28 ENCOUNTER — Encounter: Payer: Self-pay | Admitting: Emergency Medicine

## 2019-10-28 ENCOUNTER — Ambulatory Visit (INDEPENDENT_AMBULATORY_CARE_PROVIDER_SITE_OTHER): Payer: Medicare Other | Admitting: Emergency Medicine

## 2019-10-28 DIAGNOSIS — I5042 Chronic combined systolic (congestive) and diastolic (congestive) heart failure: Secondary | ICD-10-CM | POA: Diagnosis not present

## 2019-10-28 DIAGNOSIS — J449 Chronic obstructive pulmonary disease, unspecified: Secondary | ICD-10-CM | POA: Diagnosis not present

## 2019-10-28 NOTE — Assessment & Plan Note (Signed)
Continue your Lasix as you have been using it.  Make sure Dr. [name] is aware that occasionally you take 80 mg instead of 40 mg in a day.

## 2019-10-28 NOTE — Assessment & Plan Note (Signed)
Overall stable. No flares. She had PNA vaccine at age 74, should not need repeat.   Please continue Anoro once daily. Keep your albuterol available to use 2 puffs if needed for shortness of breath, chest tightness, wheezing. Your flu shot and pneumonia shot are both up-to-date. You will be a good candidate for the COVID-19 shot when it becomes available. Follow with Dr Lamonte Sakai in 6 months or sooner if you have any problems

## 2019-10-28 NOTE — Patient Instructions (Signed)
Please continue Anoro once daily. Keep your albuterol available to use 2 puffs if needed for shortness of breath, chest tightness, wheezing. Continue your Zyrtec and fluticasone nasal spray as you have been taking them. Your flu shot and pneumonia shot are both up-to-date. You will be a good candidate for the COVID-19 shot when it becomes available. Continue your Lasix as you have been using it.  Make sure Dr. [name] is aware that occasionally you take 80 mg instead of 40 mg in a day. Follow with Dr Lamonte Sakai in 6 months or sooner if you have any problems

## 2019-10-28 NOTE — Assessment & Plan Note (Signed)
Continue your Zyrtec and fluticasone nasal spray as you have been taking them.

## 2019-10-28 NOTE — Progress Notes (Signed)
Subjective:    Patient ID: Tracy [name], female    DOB: [birth year], 74 y.o.   MRN: [id number]  Shortness of Breath Associated symptoms include headaches. Pertinent negatives include no ear pain, fever, leg swelling, rash, rhinorrhea, sore throat, vomiting or wheezing.    ROV 08/02/18 --patient follows up today for her history of COPD (mixed obstruction and restriction), coronary disease with hypertension and diastolic CHF.  I saw her 2 months ago and we decided to try starting long-acting bronchodilator for her contribution from COPD.  We tried Spiriva Respimat.  She reports that she believes that it did help her breathing and her functional capacity. It is too costly, her formulary apparently prefers Incruse (and trelegy, bevespi). She had her flu shot. She is up to date on PNA shot.   ROV 10/29/18 --74 year old woman with a history of mixed obstruction and restriction, COPD.  She also has coronary disease, hypertension with diastolic dysfunction.  We have had difficulty getting her on a stable inhaled regimen due to cost.  She was seen here in December for what sounded like an acute exacerbation of COPD and possibly also some contribution of her diastolic CHF.  She was only marginally compliant with both her diuretics and her Incruse.  She was changed to Anoro - ran out 1 week ago, did not refill it. High deductible for a Tier 3 med. She has been dealing with a URI, improving but still symptomatic. She uses albuterol about few times a week. Needs it when walking, is able to shop. Uses a walker. Zyrtec every day, protonix 20 qd.   ROV 06/13/2019 --75 year old woman with coronary disease, hypertension with associated diastolic dysfunction.  We follow her for mixed obstruction and restriction, COPD, allergic rhinitis, GERD.  We had been managing her with Anoro but there have been significant barriers due to cost.  She reports today that she has tolerated Anoro, benefits. She has albuterol, uses it  approximately 1-2x. She has more difficulty breathing in the hot humid air. She has some increased head and chest mucous, chest congestion for the last week. On zyrtec. She has gained about 4 lbs since last time, increased LE edema. On lasix 40 qd.   ROV 10/28/19 --follow-up visit for 74 year old woman with hypertension, CAD, mixed obstructive and restrictive disease.  We have been managing COPD with Anoro.  Also with allergic rhinitis on Zyrtec, last time we added fluticasone nasal spray, seems to have helped her nasal congestion.  I treated her with a brief course of Lasix due to some increased volume, weight and crackles on exam at her last visit.  She reports that she still has some difficulty with chest fullness and SOB - she will increase her lasix from 40 to 80mg  as needed, seems to help her breathing. She remains on Anoro - not sure how much it helps her. She uses albuterol with heavier exertion, does help her, does not need it every day. No flares reported, although she did get steroid injections for her knees. Her flu shot is up to date, PNA shot is up to date.    Review of Systems  Constitutional: Positive for unexpected weight change. Negative for fever.  HENT: Negative for congestion, dental problem, ear pain, nosebleeds, postnasal drip, rhinorrhea, sinus pressure, sneezing, sore throat and trouble swallowing.   Eyes: Negative for redness and itching.  Respiratory: Positive for cough and shortness of breath. Negative for chest tightness and wheezing.   Cardiovascular: Negative for palpitations and leg swelling.  Gastrointestinal: Negative for nausea and vomiting.  Genitourinary: Negative for dysuria.  Musculoskeletal: Positive for joint swelling.  Skin: Negative for rash.  Neurological: Positive for headaches.  Hematological: Does not bruise/bleed easily.  Psychiatric/Behavioral: Negative for dysphoric mood. The patient is not nervous/anxious.        Objective:   Physical  Exam Vitals:   10/28/19 1026  BP: 124/84  Pulse: 83  SpO2: 96%  Weight: 227 lb (103 kg)  Height: 5\' 4"  (1.626 m)   Gen: Pleasant, overwt, in no distress,  normal affect, no cough  ENT: No lesions,  mouth clear,  oropharynx clear, no postnasal drip  Neck: No JVD, no stridor  Lungs: No use of accessory muscles, slightly distant, few exp wheezes on a forced exp, no crackles  Cardiovascular: RRR, heart sounds normal, no murmur or gallops, 1-2+ LE peripheral edema, more on the L  Musculoskeletal: No deformities, no cyanosis or clubbing  Neuro: alert, non focal  Skin: Warm, no lesions or rash     Assessment & Plan:  COPD (chronic obstructive pulmonary disease) (HCC) Overall stable. No flares. She had PNA vaccine at age 29, should not need repeat.   Please continue Anoro once daily. Keep your albuterol available to use 2 puffs if needed for shortness of breath, chest tightness, wheezing. Your flu shot and pneumonia shot are both up-to-date. You will be a good candidate for the COVID-19 shot when it becomes available. Follow with Dr Lamonte Sakai in 6 months or sooner if you have any problems  Allergic rhinitis Continue your Zyrtec and fluticasone nasal spray as you have been taking them.  Chronic combined systolic and diastolic heart failure (HCC) Continue your Lasix as you have been using it.  Make sure Dr. [name] is aware that occasionally you take 80 mg instead of 40 mg in a day.  Baltazar Apo, MD, PhD 10/28/2019, 10:55 AM [facility] [phone] or if no answer [phone]

## 2019-10-28 NOTE — Assessment & Plan Note (Signed)
>>  ASSESSMENT AND PLAN FOR CHRONIC COMBINED SYSTOLIC AND DIASTOLIC HEART FAILURE ([place]) WRITTEN ON 10/28/2019 10:55 AM BY BYRUM, Rose Fillers, MD  Continue your Lasix as you have been using it.  Make sure Dr. [name] is aware that occasionally you take 80 mg instead of 40 mg in a day.

## 2019-11-02 ENCOUNTER — Other Ambulatory Visit: Payer: Self-pay | Admitting: Family Medicine

## 2019-11-13 ENCOUNTER — Telehealth: Payer: Self-pay | Admitting: Emergency Medicine

## 2019-11-13 ENCOUNTER — Other Ambulatory Visit: Payer: Self-pay | Admitting: Emergency Medicine

## 2019-11-13 MED ORDER — PREDNISONE 10 MG PO TABS: ORAL_TABLET | ORAL | 0 refills | Status: DC

## 2019-11-13 MED ORDER — DOXYCYCLINE HYCLATE 100 MG PO TABS: 100.0000 mg | ORAL_TABLET | Freq: Two times a day (BID) | ORAL | 0 refills | Status: DC

## 2019-11-13 NOTE — Telephone Encounter (Signed)
She is difficult to diagnose over the phone because she also had diastolic CHF, has experienced similar symptoms in the past that were related to fluid overload. She uses variable dose lasix depending on her weight, I believe.   If her weight has been stable, then I am OK treating for an acute flare of her COPD >> doxycycline 100mg  bid x 7 days, prednisone taper: Take 30mg  daily for 3 days, then 20mg  daily for 3 days, then 10mg  daily for 3 days, then stop  If her breathing is not changing with these meds then she will need to let us know so we can discuss possible contribution of her heart disease.

## 2019-11-13 NOTE — Telephone Encounter (Signed)
Called spoke with patient who reported that she does not weigh herself daily, perhaps once per week.  She does take her lasix every day.    Patient does not believe her symptoms are related to her CHF and reports that while her weight has increased during COVID, does not believe that it has been a drastic increase signaling fluid overload.  Patient would like to proceed with the doxycycline and prednisone taper.  Discussed with patient in detail Dr Agustina Caroli recommendations to contact the office if the medications do not improve her symptoms and patient voiced her understanding and denied any questions/concerns.  Rx's sent to verified pharmacy. Nothing further needed at this time; will sign off.

## 2019-11-13 NOTE — Telephone Encounter (Signed)
Pt returned call & can be reached [phone].

## 2019-11-13 NOTE — Telephone Encounter (Signed)
LMTCB x1 for pt.  

## 2019-11-13 NOTE — Telephone Encounter (Signed)
Spoke with the pt  She is c/o increased cough and congestion over the past 3 days  She is coughing up moderate amount of clear to yellow sputum  She has also noticed some wheezing  She has not had any fever or body aches and her SOB is about the same since she was just seen on 10/28/19  She denies any sick contacts or recent travel She would like something called to pharm rather than televisit  Pt aware RB not in office until this pm and ok waiting until then for response  Please advise thanks!  Allergies  Allergen Reactions  . Erythromycin Rash    But isn't certain  . Sulfamethoxazole Rash

## 2019-11-14 ENCOUNTER — Telehealth: Payer: Self-pay | Admitting: Emergency Medicine

## 2019-11-14 MED ORDER — PREDNISONE 10 MG PO TABS: ORAL_TABLET | ORAL | 0 refills | Status: DC

## 2019-11-14 NOTE — Telephone Encounter (Signed)
Called and spoke with Patient.  Patient stated she picked up her Prednisone prescription and only had 12 pills. Prednisone instructions was for 18 pills. [facility] [address] for 6 pills, take as directed to complete Prednisone taper. Nothing further at this time.   Per RB 11/13/19- flare of her COPD >> doxycycline 100mg  bid x 7 days, prednisone taper: Take 30mg  daily for 3 days, then 20mg  daily for 3 days, then 10mg  daily for 3 days, then stop

## 2019-11-18 ENCOUNTER — Encounter: Payer: Self-pay | Admitting: Cardiovascular Disease

## 2019-11-18 ENCOUNTER — Ambulatory Visit (INDEPENDENT_AMBULATORY_CARE_PROVIDER_SITE_OTHER): Payer: Medicare Other | Admitting: Cardiovascular Disease

## 2019-11-18 ENCOUNTER — Other Ambulatory Visit: Payer: Self-pay

## 2019-11-18 VITALS — BP 126/74 | HR 75 | Ht 65.0 in | Wt 221.4 lb

## 2019-11-18 DIAGNOSIS — I10 Essential (primary) hypertension: Secondary | ICD-10-CM | POA: Diagnosis not present

## 2019-11-18 DIAGNOSIS — I25119 Atherosclerotic heart disease of native coronary artery with unspecified angina pectoris: Secondary | ICD-10-CM | POA: Diagnosis not present

## 2019-11-18 DIAGNOSIS — N1831 Chronic kidney disease, stage 3a: Secondary | ICD-10-CM | POA: Diagnosis not present

## 2019-11-18 DIAGNOSIS — E782 Mixed hyperlipidemia: Secondary | ICD-10-CM | POA: Diagnosis not present

## 2019-11-18 DIAGNOSIS — I5032 Chronic diastolic (congestive) heart failure: Secondary | ICD-10-CM

## 2019-11-18 DIAGNOSIS — R0602 Shortness of breath: Secondary | ICD-10-CM

## 2019-11-18 LAB — LIPID PANEL
Chol/HDL Ratio: 3.8 ratio (ref 0.0–4.4)
Cholesterol, Total: 185 mg/dL (ref 100–199)
HDL: 49 mg/dL (ref >39)
LDL Chol Calc (NIH): 102 mg/dL — ABNORMAL HIGH (ref 0–99)
Triglycerides: 198 mg/dL — ABNORMAL HIGH (ref 0–149)
VLDL Cholesterol Cal: 34 mg/dL (ref 5–40)

## 2019-11-18 LAB — COMPREHENSIVE METABOLIC PANEL
ALT: 21 IU/L (ref 0–32)
AST: 14 IU/L (ref 0–40)
Albumin/Globulin Ratio: 1 — ABNORMAL LOW (ref 1.2–2.2)
Albumin: 4.1 g/dL (ref 3.7–4.7)
Alkaline Phosphatase: 117 IU/L (ref 39–117)
BUN/Creatinine Ratio: 25 (ref 12–28)
BUN: 38 mg/dL — ABNORMAL HIGH (ref 8–27)
Bilirubin Total: 0.5 mg/dL (ref 0.0–1.2)
CO2: 22 mmol/L (ref 20–29)
Calcium: 9.3 mg/dL (ref 8.7–10.3)
Chloride: 106 mmol/L (ref 96–106)
Creatinine, Ser: 1.54 mg/dL — ABNORMAL HIGH (ref 0.57–1.00)
GFR calc Af Amer: 39 mL/min/{1.73_m2} — ABNORMAL LOW (ref >59)
GFR calc non Af Amer: 34 mL/min/{1.73_m2} — ABNORMAL LOW (ref >59)
Globulin, Total: 4.2 g/dL (ref 1.5–4.5)
Glucose: 105 mg/dL — ABNORMAL HIGH (ref 65–99)
Potassium: 4.2 mmol/L (ref 3.5–5.2)
Sodium: 146 mmol/L — ABNORMAL HIGH (ref 134–144)
Total Protein: 8.3 g/dL (ref 6.0–8.5)

## 2019-11-18 MED ORDER — METOPROLOL SUCCINATE ER 100 MG PO TB24: 100.0000 mg | ORAL_TABLET | Freq: Every day | ORAL | 3 refills | Status: DC

## 2019-11-18 NOTE — Patient Instructions (Signed)
Medication Instructions:  1) STOP METOPROLOL TARTRATE 2) START METOPROLOL SUCCINATE 100 mg daily *If you need a refill on your cardiac medications before your next appointment, please call your pharmacy*  Lab Work: TODAY: CMET, lipids If you have labs (blood work) drawn today and your tests are completely normal, you will receive your results only by: Marland Kitchen MyChart Message (if you have MyChart) OR . A paper copy in the mail If you have any lab test that is abnormal or we need to change your treatment, we will call you to review the results.  Follow-Up: At [facility], you and your health needs are our priority.  As part of our continuing mission to provide you with exceptional heart care, we have created designated Provider Care Teams.  These Care Teams include your primary Cardiologist (physician) and Advanced Practice Providers (APPs -  Physician Assistants and Nurse Practitioners) who all work together to provide you with the care you need, when you need it. Your next appointment:   6 month(s) The format for your next appointment:   In Person Provider:   Pecolia Ades

## 2019-11-18 NOTE — Progress Notes (Signed)
Cardiology Office Note:    Date:  11/18/2019   ID:  Tracy [name], DOB 1945-12-28, MRN [id number]  PCP:  [name], [name], MD  Cardiologist:  Sherren Mocha, MD  Electrophysiologist:  None   Referring MD: [name], [name], MD   Chief Complaint  Patient presents with  . Shortness of Breath    History of Present Illness:    Tracy [name] is a 74 y.o. female with a hx of coronary artery disease, presenting for follow-up evaluation.  She had bare-metal stenting of the left circumflex in 2007 and drug-eluting stent implantation in the left circumflex in 2019.  Comorbid medical conditions include chronic diastolic heart failure, stage III chronic kidney disease, COPD, hypertension, and mixed hyperlipidemia.  The patient's last heart catheterization in October 2020 showed moderate nonobstructive disease in the mid LAD that was stable compared to previous heart catheterizations.  The stent in the circumflex was patent with minimal restenosis in the right coronary artery also had mild nonobstructive disease.  Ongoing medical therapy was recommended.  She had been doing well but has developed an upper respiratory infection over the past week. Her pulmonary physician had recently seen her and he put her on antibiotics and prednisone taper. She has noticed modest improvement but continues to cough. She feels like she can tell when she has 'fluid buildup' and she takes an extra furosemide when this occurs. Her symptoms include worsening shortness of breath. She has occasional chest discomfort, but currently just noted when she coughs.   Past Medical History:  Diagnosis Date  . Allergic rhinitis   . CAD (coronary artery disease)    1/19 PCI/DES to [place] for ISR, normal EF.   Marland Kitchen CHF (congestive heart failure) ([place])    Echo 06/2019: EF 55-60, elevated LVEDP, normal RV SF, mild MAC, mild MR, trivial TR  . Gout   . Headache(784.0)   . Hyperlipidemia   . Hypertension   . Low back pain   .  Menopausal syndrome   . Myocardial infarct ([place]) 2007   hx of  . Overactive bladder     Past Surgical History:  Procedure Laterality Date  . ANGIOPLASTY     stent 2007  . CARDIAC CATHETERIZATION    . COLONOSCOPY    . CORONARY STENT INTERVENTION N/A 11/06/2017   Procedure: CORONARY STENT INTERVENTION;  Surgeon: [name], MD;  Location: [facility];  Service: Cardiovascular;  Laterality: N/A;  . LEFT HEART CATH AND CORONARY ANGIOGRAPHY N/A 11/06/2017   Procedure: LEFT HEART CATH AND CORONARY ANGIOGRAPHY;  Surgeon: [name], MD;  Location: [facility];  Service: Cardiovascular;  Laterality: N/A;  . LEFT HEART CATH AND CORONARY ANGIOGRAPHY N/A 07/24/2019   Procedure: LEFT HEART CATH AND CORONARY ANGIOGRAPHY;  Surgeon: Burnell Blanks, MD;  Location: [facility];  Service: Cardiovascular;  Laterality: N/A;  . TONSILLECTOMY      Current Medications: Current Meds  Medication Sig  . albuterol (PROVENTIL) (2.5 MG/3ML) 0.083% nebulizer solution Take 3 mLs (2.5 mg total) by nebulization every 6 (six) hours as needed for wheezing or shortness of breath.  . allopurinol (ZYLOPRIM) 100 MG tablet TAKE 1 TABLET BY MOUTH EVERY DAY  . ALPRAZolam (XANAX) 0.25 MG tablet TAKE 1 TABLET TWICE A DAY AS NEEDED FOR ANXIETY  . aspirin EC 81 MG tablet Take 81 mg by mouth daily.  Marland Kitchen atorvastatin (LIPITOR) 40 MG tablet Take 40 mg by mouth daily.  . calcium carbonate (CALCIUM 600) 600  MG TABS tablet Take 600 mg by mouth daily with breakfast.   . cetirizine (ZYRTEC) 10 MG tablet Take 10 mg by mouth daily.  . Cholecalciferol (VITAMIN D3 PO) Take 4,000 Units by mouth daily.   . clopidogrel (PLAVIX) 75 MG tablet TAKE 1 TABLET (75 MG TOTAL) BY MOUTH DAILY WITH BREAKFAST.  Marland Kitchen Cyanocobalamin (VITAMIN B-12) 5000 MCG SUBL Place 5,000 mcg under the tongue daily.   . diclofenac sodium (VOLTAREN) 1 % GEL Apply 4 g topically 4 (four) times daily.  Marland Kitchen doxycycline (VIBRA-TABS) 100 MG  tablet Take 1 tablet (100 mg total) by mouth 2 (two) times daily.  . fluticasone (FLONASE) 50 MCG/ACT nasal spray Place 2 sprays into both nostrils daily.  . furosemide (LASIX) 40 MG tablet Take 40 mg by mouth daily.  Marland Kitchen HYDROcodone-acetaminophen (NORCO/VICODIN) 5-325 MG tablet Take 1 tablet by mouth daily as needed for moderate pain.  Marland Kitchen losartan (COZAAR) 100 MG tablet Take 100 mg by mouth daily.  . Misc Natural Products (TART CHERRY ADVANCED) CAPS Take 1,000 capsules by mouth daily.   . nitroGLYCERIN (NITROSTAT) 0.4 MG SL tablet PLACE 1 TABLET (0.4 MG TOTAL) UNDER THE TONGUE EVERY 5 (FIVE) MINUTES AS NEEDED FOR CHEST PAIN.  Marland Kitchen pantoprazole (PROTONIX) 20 MG tablet Take 1 tablet (20 mg total) by mouth daily.  . potassium chloride SA (KLOR-CON M20) 20 MEQ tablet Take 1 tablet (20 mEq total) by mouth 2 (two) times daily.  . predniSONE (DELTASONE) 10 MG tablet 3 tabs by mouth once daily x3 days, 2 tabs once daily x3 days, 1 tab daily x3 days and stop  . umeclidinium-vilanterol (ANORO ELLIPTA) 62.5-25 MCG/INH AEPB Inhale 1 puff into the lungs daily.  . VENTOLIN HFA 108 (90 Base) MCG/ACT inhaler INHALE 2 PUFFS INTO THE LUNGS EVERY 4 (FOUR) HOURS AS NEEDED FOR WHEEZING OR SHORTNESS OF BREATH.  Marland Kitchen Vitamin E 180 MG CAPS Take 180 Units by mouth daily.   . [DISCONTINUED] metoprolol tartrate (LOPRESSOR) 100 MG tablet Take 100 mg by mouth daily.     Allergies:   Erythromycin and Sulfamethoxazole   Social History   Socioeconomic History  . Marital status: Married    Spouse name: Not on file  . Number of children: Not on file  . Years of education: Not on file  . Highest education level: Not on file  Occupational History  . Occupation: direct Civil engineer, contracting  Tobacco Use  . Smoking status: Former Smoker    Packs/day: 0.20    Years: 50.00    Pack years: 10.00    Quit date: 07/01/2012    Years since quitting: 7.3  . Smokeless tobacco: Never Used  . Tobacco comment: completely quit May of 2018;  period of years she did not smoke   Substance and Sexual Activity  . Alcohol use: No  . Drug use: No  . Sexual activity: Not on file  Other Topics Concern  . Not on file  Social History Narrative  . Not on file   Social Determinants of Health   Financial Resource Strain:   . Difficulty of Paying Living Expenses: Not on file  Food Insecurity:   . Worried About Charity fundraiser in the Last Year: Not on file  . Ran Out of Food in the Last Year: Not on file  Transportation Needs:   . Lack of Transportation (Medical): Not on file  . Lack of Transportation (Non-Medical): Not on file  Physical Activity:   . Days of Exercise per  Week: Not on file  . Minutes of Exercise per Session: Not on file  Stress:   . Feeling of Stress : Not on file  Social Connections:   . Frequency of Communication with Friends and Family: Not on file  . Frequency of Social Gatherings with Friends and Family: Not on file  . Attends Religious Services: Not on file  . Active Member of Clubs or Organizations: Not on file  . Attends Archivist Meetings: Not on file  . Marital Status: Not on file     Family History: The patient's family history includes Colon cancer (age of onset: 12) in her mother; Other in an other family member. There is no history of Stomach cancer. She was adopted.  ROS:   Please see the history of present illness.    All other systems reviewed and are negative.  EKGs/Labs/Other Studies Reviewed:    The following studies were reviewed today: LEFT HEART CATH AND CORONARY ANGIOGRAPHY 07/24/2019  Conclusion   Ost LAD lesion is 30% stenosed.  Prox LAD to Mid LAD lesion is 50% stenosed.  Mid LAD lesion is 60% stenosed.  Mid LAD to Dist LAD lesion is 40% stenosed.  Ost Cx lesion is 25% stenosed.  Prox RCA to Mid RCA lesion is 25% stenosed.  Prox Cx to Mid Cx lesion is 10% stenosed.  1. The left main is patent with no obstructive disease.  2. The LAD is a large  caliber vessel that courses to the apex. The mid LAD has moderate disease that is unchanged from the catheterization in 2019. The lesions in the mid LAD do not appear to be flow limiting.  3. The stent in the mid Circumflex artery is patent with minimal restenosis 4. The RCA is a large dominant vessel with mild proximal to mid stenosis.   Recommendations: Continue medical management of CAD   Echocardiogram 07/17/2019 IMPRESSIONS 1. Left ventricular ejection fraction, by visual estimation, is 55 to 60%. The left ventricle has normal function. Normal left ventricular size. There is no left ventricular hypertrophy. 2. Elevated left ventricular end-diastolic pressure. 3. Left ventricular diastolic Doppler parameters are consistent with impaired relaxation pattern of LV diastolic filling. 4. Global right ventricle has normal systolic function.The right ventricular size is normal. No increase in right ventricular wall thickness. 5. Left atrial size was normal. 6. Right atrial size was normal. 7. Mild mitral annular calcification. 8. The mitral valve is normal in structure. Mild mitral valve regurgitation. No evidence of mitral stenosis. 9. The tricuspid valve is normal in structure. Tricuspid valve regurgitation is trivial. 10. The aortic valve is grossly normal Aortic valve regurgitation was not visualized by color flow Doppler. Structurally normal aortic valve, with no evidence of sclerosis or stenosis. 11. The pulmonic valve was normal in structure. Pulmonic valve regurgitation is not visualized by color flow Doppler. 12. Normal pulmonary artery systolic pressure. 13. The inferior vena cava is normal in size with greater than 50% respiratory variability, suggesting right atrial pressure of 3 mmHg.  EKG:  EKG is not ordered today.   Recent Labs: 07/10/2019: Hemoglobin 12.9; Magnesium 1.9; NT-Pro BNP 435; Platelets 288 08/07/2019: BUN 17; Creatinine, Ser 1.36; Potassium 4.9; Sodium 141    Recent Lipid Panel    Component Value Date/Time   CHOL 168 08/07/2019 1042   TRIG 209 (H) 08/07/2019 1042   HDL 37 (L) 08/07/2019 1042   CHOLHDL 4.5 (H) 08/07/2019 1042   CHOLHDL 5.0 (H) 09/13/2016 0738   VLDL 35 (H)  09/13/2016 0738   LDLCALC 95 08/07/2019 1042   LDLDIRECT 111.0 01/05/2015 1019    Physical Exam:    VS:  BP 126/74   Pulse 75   Ht _0  (1.651 m)   Wt 221 lb 6.4 oz (100.4 kg)   SpO2 95%   BMI 36.84 kg/m     Wt Readings from Last 3 Encounters:  11/18/19 221 lb 6.4 oz (100.4 kg)  10/28/19 227 lb (103 kg)  08/07/19 230 lb (104.3 kg)     GEN: Well nourished, well developed in no acute distress HEENT: Normal NECK: No JVD; No carotid bruits LYMPHATICS: No lymphadenopathy CARDIAC: RRR, no murmurs, rubs, gallops RESPIRATORY:  Clear to auscultation without rales, wheezing or rhonchi  ABDOMEN: Soft, non-tender, non-distended MUSCULOSKELETAL: left leg with 1+ edema, right leg with no edema; No deformity  SKIN: Warm and dry NEUROLOGIC:  Alert and oriented x 3 PSYCHIATRIC:  Normal affect   ASSESSMENT:    1. Coronary artery disease involving native coronary artery of native heart with angina pectoris ([place])   2. Shortness of breath   3. Stage 3a chronic kidney disease   4. Essential (primary) hypertension   5. Mixed hyperlipidemia   6. Chronic diastolic CHF (congestive heart failure) ([facility])    PLAN:    In order of problems listed above:  1. I personally reviewed the patient's cath films with her today.  The RCA is widely patent.  The circumflex is widely patent with mild in-stent restenosis noted.  The LAD has diffuse moderate disease throughout the entire mid vessel.  There is significant tortuosity.  The LAD would be very unfavorable for PCI and I think that ongoing medical therapy is indicated.  There is been no progression noted since her last heart catheterization.  She will continue on her current medical program which includes aspirin, clopidogrel,  atorvastatin, and metoprolol. 2. Suspect multifactorial with diastolic dysfunction, COPD, upper respiratory tract infection, and obesity.  Continue current therapy. 3. Check labs today 4. Blood pressure is controlled.  She is only taking metoprolol tartrate once daily.  I am going to switch her to metoprolol succinate at the same dose.  She will continue losartan. 5. Continue atorvastatin.  LDL cholesterol has been above goal.  She admits that she was not taking her statin every day when last checked in October.  She has been compliant with her medication of late.  We will repeat labs.  She might need Zetia added to her regimen. 6. Appears euvolemic on exam.  Continue furosemide 40 mg daily.  Sodium restriction, diuretic therapy, and control of blood pressure will be keys to her treatment.   Medication Adjustments/Labs and Tests Ordered: Current medicines are reviewed at length with the patient today.  Concerns regarding medicines are outlined above.  Orders Placed This Encounter  Procedures  . Lipid panel  . Comprehensive metabolic panel   Meds ordered this encounter  Medications  . metoprolol succinate (TOPROL-XL) 100 MG 24 hr tablet    Sig: Take 1 tablet (100 mg total) by mouth daily. Take with or immediately following a meal.    Dispense:  90 tablet    Refill:  3    Patient Instructions  Medication Instructions:  1) STOP METOPROLOL TARTRATE 2) START METOPROLOL SUCCINATE 100 mg daily *If you need a refill on your cardiac medications before your next appointment, please call your pharmacy*  Lab Work: TODAY: CMET, lipids If you have labs (blood work) drawn today and your tests are completely  normal, you will receive your results only by: Marland Kitchen MyChart Message (if you have MyChart) OR . A paper copy in the mail If you have any lab test that is abnormal or we need to change your treatment, we will call you to review the results.  Follow-Up: At [facility], you and your health needs  are our priority.  As part of our continuing mission to provide you with exceptional heart care, we have created designated Provider Care Teams.  These Care Teams include your primary Cardiologist (physician) and Advanced Practice Providers (APPs -  Physician Assistants and Nurse Practitioners) who all work together to provide you with the care you need, when you need it. Your next appointment:   6 month(s) The format for your next appointment:   In Person Provider:   Pecolia Ades    Signed, Sherren Mocha, MD  11/18/2019 8:53 AM    [place]

## 2019-11-26 ENCOUNTER — Ambulatory Visit: Payer: Medicare Other

## 2019-12-02 ENCOUNTER — Telehealth: Payer: Self-pay

## 2019-12-02 DIAGNOSIS — E782 Mixed hyperlipidemia: Secondary | ICD-10-CM

## 2019-12-02 MED ORDER — EZETIMIBE 10 MG PO TABS: 10.0000 mg | ORAL_TABLET | Freq: Every day | ORAL | 3 refills | Status: DC

## 2019-12-02 NOTE — Telephone Encounter (Signed)
-----   Message from Sherren Mocha, MD sent at 11/19/2019  8:26 AM EST ----- Creatinine up a little from baseline. LDL above goal. Would continue current diuretic Rx, add zetia 10 mg daily for additional LDL lowering, and repeat CMET/Lipid in 3 months. thx

## 2019-12-02 NOTE — Telephone Encounter (Signed)
Reviewed results with patient who verbalized understanding.   Instructed patient to START ZETIA 10 mg daily. CMET and lipids scheduled 5/17.  The patient was grateful for call and agrees with treatment plan.

## 2019-12-31 ENCOUNTER — Other Ambulatory Visit: Payer: Self-pay | Admitting: Family Medicine

## 2019-12-31 ENCOUNTER — Other Ambulatory Visit: Payer: Self-pay | Admitting: Emergency Medicine

## 2019-12-31 DIAGNOSIS — K219 Gastro-esophageal reflux disease without esophagitis: Secondary | ICD-10-CM

## 2020-01-01 ENCOUNTER — Other Ambulatory Visit: Payer: Self-pay

## 2020-01-01 ENCOUNTER — Ambulatory Visit
Admission: RE | Admit: 2020-01-01 | Discharge: 2020-01-01 | Disposition: A | Discharge status: Home / Self Care | Payer: Medicare Other | Source: Ambulatory Visit | Attending: Family Medicine | Admitting: Family Medicine

## 2020-01-01 DIAGNOSIS — Z1231 Encounter for screening mammogram for malignant neoplasm of breast: Secondary | ICD-10-CM | POA: Diagnosis not present

## 2020-01-03 ENCOUNTER — Other Ambulatory Visit: Payer: Self-pay | Admitting: Family Medicine

## 2020-01-03 DIAGNOSIS — R928 Other abnormal and inconclusive findings on diagnostic imaging of breast: Secondary | ICD-10-CM

## 2020-01-20 ENCOUNTER — Other Ambulatory Visit: Payer: Self-pay

## 2020-01-20 ENCOUNTER — Other Ambulatory Visit: Payer: Self-pay | Admitting: Family Medicine

## 2020-01-20 ENCOUNTER — Ambulatory Visit
Admission: RE | Admit: 2020-01-20 | Discharge: 2020-01-20 | Disposition: A | Discharge status: Home / Self Care | Payer: Medicare Other | Source: Ambulatory Visit | Attending: Family Medicine | Admitting: Family Medicine

## 2020-01-20 DIAGNOSIS — R928 Other abnormal and inconclusive findings on diagnostic imaging of breast: Secondary | ICD-10-CM

## 2020-01-20 DIAGNOSIS — R921 Mammographic calcification found on diagnostic imaging of breast: Secondary | ICD-10-CM | POA: Diagnosis not present

## 2020-01-27 ENCOUNTER — Ambulatory Visit
Admission: RE | Admit: 2020-01-27 | Discharge: 2020-01-27 | Disposition: A | Discharge status: Home / Self Care | Payer: Medicare Other | Source: Ambulatory Visit | Attending: Family Medicine | Admitting: Family Medicine

## 2020-01-27 ENCOUNTER — Other Ambulatory Visit: Payer: Self-pay

## 2020-01-27 ENCOUNTER — Other Ambulatory Visit: Payer: Self-pay | Admitting: Family Medicine

## 2020-01-27 DIAGNOSIS — R921 Mammographic calcification found on diagnostic imaging of breast: Secondary | ICD-10-CM

## 2020-01-27 DIAGNOSIS — D241 Benign neoplasm of right breast: Secondary | ICD-10-CM | POA: Diagnosis not present

## 2020-01-27 DIAGNOSIS — D0501 Lobular carcinoma in situ of right breast: Secondary | ICD-10-CM | POA: Diagnosis not present

## 2020-01-28 ENCOUNTER — Other Ambulatory Visit: Payer: Self-pay | Admitting: Family Medicine

## 2020-01-28 ENCOUNTER — Other Ambulatory Visit: Payer: Self-pay | Admitting: Cardiovascular Disease

## 2020-02-20 ENCOUNTER — Other Ambulatory Visit: Payer: Self-pay | Admitting: Emergency Medicine

## 2020-02-21 DIAGNOSIS — N6091 Unspecified benign mammary dysplasia of right breast: Secondary | ICD-10-CM | POA: Diagnosis not present

## 2020-02-21 DIAGNOSIS — D241 Benign neoplasm of right breast: Secondary | ICD-10-CM | POA: Diagnosis not present

## 2020-02-21 DIAGNOSIS — D0501 Lobular carcinoma in situ of right breast: Secondary | ICD-10-CM | POA: Diagnosis not present

## 2020-02-22 ENCOUNTER — Ambulatory Visit: Payer: Self-pay | Admitting: Surgery

## 2020-02-22 DIAGNOSIS — N6091 Unspecified benign mammary dysplasia of right breast: Secondary | ICD-10-CM

## 2020-02-26 ENCOUNTER — Telehealth: Payer: Self-pay | Admitting: Cardiovascular Disease

## 2020-02-26 ENCOUNTER — Ambulatory Visit: Payer: Self-pay | Admitting: Surgery

## 2020-02-26 NOTE — Telephone Encounter (Signed)
New Message     [facility] Pre-operative Risk Assessment    Request for surgical clearance:  1. What type of surgery is being performed? Radioactive Feed Lumpectomy  2. When is this surgery scheduled? TBD  3. What type of clearance is required (medical clearance vs. Pharmacy clearance to hold med vs. Both)? Both  4. Are there any medications that need to be held prior to surgery and how long? Holding Aspirin and Plavix  5. Practice name and name of physician performing surgery? Dr. Georgette Dover, [facility]  6. What is your office phone number [phone]   7.   What is your office fax number [phone]  8.   Anesthesia type (None, local, MAC, general) ? General    Tracy [name] 02/26/2020, 4:35 PM  _________________________________________________________________   (provider comments below)

## 2020-02-26 NOTE — Telephone Encounter (Signed)
Chart reviewed. Pt at low risk of holding ASA and plavix for surgery. Please advise to hold 5 days prior to surgery if ok with surgical team. thanks

## 2020-02-26 NOTE — Telephone Encounter (Signed)
Will route to Dr. Burt Knack about holding Plavix.   KL

## 2020-02-26 NOTE — H&P (Signed)
History of Present Illness Tracy [name]. Tracy Molchan MD; 02/22/2020 8:17 AM) The patient is a 74 year old female who presents with a breast mass. Referred by Tracy [name], MD for right breast masses  This is a 74 year old female who presents after recent screening mammogram that showed a 2.6 cm area of calcifications in the right breast. Further evaluation revealed two areas in the right upper outer quadrant that were questionable. Both were biopsied. The more posterior area showed a fibroadenoma. The more anterior mass was ADH with LCIS and associated fibroadenoma. The more posterior clip migrated 2.5 cm away from the biopsy clip.  Menarche 42 First pregnancy - 22 Breastfeed - yes Menopause - mid-50's Hormones - yes  CLINICAL DATA: Screening.  EXAM: DIGITAL SCREENING BILATERAL MAMMOGRAM WITH TOMO AND CAD  COMPARISON: Previous exam(s).  ACR Breast Density Category b: There are scattered areas of fibroglandular density.  FINDINGS: In the right breast, calcifications warrant further evaluation with magnified views. In the left breast, no findings suspicious for malignancy. Images were processed with CAD.  IMPRESSION: Further evaluation is suggested for calcifications in the right breast.  RECOMMENDATION: Diagnostic mammogram of the right breast. (Code:[id number])  The patient will be contacted regarding the findings, and additional imaging will be scheduled.  BI-RADS CATEGORY 0: Incomplete. Need additional imaging evaluation and/or prior mammograms for comparison.   Electronically Signed By: Lillia Mountain M.D. On: 01/03/2020 07:53  CLINICAL DATA: Calcifications in the posterior aspect of the upper-outer right breast on a recent screening mammogram.  EXAM: DIGITAL DIAGNOSTIC RIGHT MAMMOGRAM WITH CAD  COMPARISON: Previous exam(s).  ACR Breast Density Category b: There are scattered areas of fibroglandular density.  FINDINGS: 2D true lateral and spot magnification  views of the right breast demonstrate a 2.6 x 2.0 x 1.6 cm group of heterogeneous calcifications in the posterior aspect of the upper-outer right breast. These vary in size and shape and some are arranged in a linear fashion in the true lateral projection.  Mammographic images were processed with CAD.  IMPRESSION: 2.6 cm group of calcifications suspicious for malignancy in the upper-outer quadrant of the right breast.  RECOMMENDATION: Stereotactic guided core needle biopsy of the 2.6 cm group of calcifications in the upper outer quadrant of the right breast. This has been discussed with the patient and scheduled at 11:30 a.m. on 01/27/2020.  I have discussed the findings and recommendations with the patient. If applicable, a reminder letter will be sent to the patient regarding the next appointment.  BI-RADS CATEGORY 4: Suspicious.   Electronically Signed By: Claudie Revering M.D. On: 01/20/2020 17:30  CLINICAL DATA: 74 year old female presenting for biopsy of two groups of right breast calcifications.  EXAM: RIGHT BREAST STEREOTACTIC CORE NEEDLE BIOPSY x2  COMPARISON: Previous exams.  FINDINGS: The patient and I discussed the procedure of stereotactic-guided biopsy including benefits and alternatives. We discussed the high likelihood of a successful procedure. We discussed the risks of the procedure including infection, bleeding, tissue injury, clip migration, and inadequate sampling. Informed written consent was given. The usual time out protocol was performed immediately prior to the procedure.  1. Using sterile technique and 1% Lidocaine as local anesthetic, under stereotactic guidance, a 9 gauge vacuum assisted device was used to perform core needle biopsy of calcifications in the upper outer right breast, posterior using a superior approach. Specimen radiograph was performed showing at least 2 specimens with calcifications. Specimens with calcifications are  identified for pathology.  Lesion quadrant: Upper outer quadrant  At the conclusion of  the procedure, a coil tissue marker clip was deployed into the biopsy cavity. Follow-up 2-view mammogram was performed and dictated separately.  2. Using sterile technique and 1% Lidocaine as local anesthetic, under stereotactic guidance, a 9 gauge vacuum assisted device was used to perform core needle biopsy of calcifications in the upper outer right breast, anterior using a superior approach. Specimen radiograph was performed showing at least 5 specimens with calcifications. Specimens with calcifications are identified for pathology.  Lesion quadrant: Upper outer quadrant  At the conclusion of the procedure, an X tissue marker clip was deployed into the biopsy cavity. Follow-up 2-view mammogram was performed and dictated separately.  IMPRESSION: Stereotactic-guided biopsy of two groups of calcifications in the upper outer right breast. No apparent complications.  Electronically Signed: By: Audie Pinto M.D. On: 01/27/2020 12:43    Problem List/Past Medical Tracy Key K. 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 Allopurinol (100MG  Tablet, Oral) Active. Anoro Ellipta (62.5-25MCG/INH Aero Pow Br Act, Inhalation) Active. Atorvastatin Calcium (40MG  Tablet, Oral) Active. Clopidogrel  Bisulfate (75MG  Tablet, Oral) Active. Ezetimibe (10MG  Tablet, Oral) Active. Fluticasone Propionate (50MCG/ACT Suspension, Nasal) Active. Furosemide (40MG  Tablet, Oral) Active. Klor-Con M20 [facility] Tablet ER, Oral) Active. Losartan Potassium (100MG  Tablet, Oral) Active. Metoprolol Succinate ER (100MG  Tablet ER 24HR, Oral) Active. Pantoprazole Sodium (20MG  Tablet DR, Oral) Active. Nitrostat (Sublingual) Specific strength unknown - Active. Medications Reconciled  Social History Tracy Lorenzo, LPN; [id number] 075-GRM AM) Alcohol use  Remotely quit alcohol use. Caffeine use  Coffee. Tobacco use  Former smoker.  Family History Tracy Lorenzo, LPN; [id number] 075-GRM AM) Colon Cancer  Mother. Depression  Mother. Diabetes Mellitus  Daughter. Migraine Headache  Daughter. Respiratory Condition  Daughter.  Pregnancy / Birth History Tracy Lorenzo, LPN; [id number] 075-GRM AM) Age at menarche  39 years. Age of menopause  70-62 Contraceptive History  Oral contraceptives. Gravida  2 Length (months) of breastfeeding  3-6 Maternal age  59-20 Para  2  Other Problems Tracy [name]. Breuna Loveall, MD; 02/22/2020 8:17 AM) Anxiety Disorder  Arthritis  Chronic Obstructive Lung Disease  Diverticulosis  Gastroesophageal Reflux Disease  Hemorrhoids  High blood pressure  Hypercholesterolemia  Myocardial infarction     Review of Systems Claiborne Billings Dockery LPN; [id number] 075-GRM AM) General Not Present- Appetite Loss, Chills, Fatigue, Fever, Night Sweats, Weight Gain and Weight Loss. HEENT Present- Seasonal Allergies and Wears glasses/contact lenses. Not Present- Earache, Hearing Loss, Hoarseness, Nose Bleed, Oral Ulcers, Ringing in the Ears, Sinus Pain, Sore Throat, Visual Disturbances and Yellow Eyes. Respiratory Present- Wheezing. Not Present- Bloody sputum, Chronic Cough, Difficulty Breathing and Snoring. Cardiovascular Present- Leg Cramps, Shortness of Breath and Swelling of Extremities. Not  Present- Chest Pain, Difficulty Breathing Lying Down, Palpitations and Rapid Heart Rate. Gastrointestinal Present- Bloating, Excessive gas, Hemorrhoids and Indigestion. Not Present- Abdominal Pain, Bloody Stool, Change in Bowel Habits, Chronic diarrhea, Constipation, Difficulty Swallowing, Gets full quickly at meals, Nausea, Rectal Pain and Vomiting. Musculoskeletal Present- Joint Pain, Joint Stiffness, Muscle Pain, Muscle Weakness and Swelling of Extremities. Not Present- Back  Pain. Neurological Present- Headaches and Trouble walking. Not Present- Decreased Memory, Fainting, Numbness, Seizures, Tingling, Tremor and Weakness. Psychiatric Present- Anxiety and Change in Sleep Pattern. Not Present- Bipolar, Depression, Fearful and Frequent crying. Endocrine Not Present- Cold Intolerance, Excessive Hunger, Hair Changes, Heat Intolerance, Hot flashes and New Diabetes. Hematology Present- Blood Thinners, Easy Bruising and Excessive bleeding. Not Present- Gland problems, HIV and Persistent Infections.  Vitals (Tracy Nolan CMA; 02/21/2020 9:33 AM) 02/21/2020 9:33 AM Weight: 232.25 lb Height: 65in Body Surface Area: 2.11 m Body Mass Index: 38.65 kg/m  Temp.: 97.17F  Pulse: 131 (Regular)  BP: 132/82(Sitting, Left Arm, Standard)       Physical Exam Tracy Key K. Brendon Christoffel MD; 02/22/2020 8:21 AM) The physical exam findings are as follows: Note: Constitutional: WDWN in NAD, conversant, no obvious deformities Eyes: Pupils equal, round; sclera anicteric; moist conjunctiva; no lid lag HENT: Oral mucosa moist; good dentition Neck: No masses palpated, trachea midline; no thyromegaly Lungs: CTA bilaterally; normal respiratory effort Breasts: symmetric, no nipple changes, no axillary lymphadenopathy; no dominant masses CV: Regular rate and rhythm; no murmurs; extremities well-perfused with no edema Abd: +bowel sounds, soft, non-tender, no palpable organomegaly; no palpable hernias Musc: Normal gait; no  apparent clubbing or cyanosis in extremities Lymphatic: No palpable cervical or axillary lymphadenopathy Skin: Warm, dry; no sign of jaundice Psychiatric - alert and oriented x 4; calm mood and affect    Assessment & Plan Tracy Key K. 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 The surgical procedure has been discussed with the patient. Potential risks, benefits, alternative treatments, and expected outcomes have been explained. All of the patient's questions at this time have been answered. The likelihood of reaching the patient's treatment goal is good. The patient understand the proposed surgical procedure and wishes to proceed.   Cardiac clearance  Tracy [name]. Georgette Dover, MD, [facility]  General/ Trauma Surgery   02/26/2020 5:43 PM

## 2020-02-26 NOTE — Telephone Encounter (Signed)
Dr.Cooper,   Tracy [name] has a hx of She had bare-metal stenting of the left circumflex in 2007 and drug-eluting stent implantation in the left circumflex in 2019. Per your note on 11/18/2019 most recent LHC showed  "moderate nonobstructive disease in the mid LAD that was stable compared to previous heart catheterizations.  The stent in the circumflex was patent with minimal restenosis in the right coronary artery also had mild nonobstructive disease.  Ongoing medical therapy was recommended."   Please weigh in on holding Plavix for surgical procedure. Reply to pre-op pool please,   Thank you, Curt Bears

## 2020-02-27 NOTE — Telephone Encounter (Signed)
   Primary Cardiologist: Sherren Mocha, MD  Chart reviewed as part of pre-operative protocol coverage. Given past medical history and time since last visit, based on ACC/AHA guidelines, Tracy [name] would be at acceptable risk for the planned procedure without further cardiovascular testing.   Chart reviewed. Pt at low risk of holding ASA and plavix for surgery.  She may hold her aspirin and Plavix for 5 days prior to the surgery and restart as soon as hemostasis is achieved.   I will route this recommendation to the requesting party via Epic fax function and remove from pre-op pool.  Please call with questions.  Tracy [name]. Lindsay Soulliere NP-C    02/27/2020, 8:53 AM [address] [place] [address] Office [phone] Fax [phone]

## 2020-02-28 ENCOUNTER — Other Ambulatory Visit: Payer: Self-pay | Admitting: Surgery

## 2020-02-28 DIAGNOSIS — N6091 Unspecified benign mammary dysplasia of right breast: Secondary | ICD-10-CM

## 2020-03-02 ENCOUNTER — Other Ambulatory Visit: Payer: Medicare Other

## 2020-03-04 ENCOUNTER — Other Ambulatory Visit: Payer: Medicare Other | Admitting: *Deleted

## 2020-03-04 ENCOUNTER — Other Ambulatory Visit: Payer: Self-pay

## 2020-03-04 DIAGNOSIS — E782 Mixed hyperlipidemia: Secondary | ICD-10-CM | POA: Diagnosis not present

## 2020-03-04 LAB — COMPREHENSIVE METABOLIC PANEL
ALT: 40 IU/L — ABNORMAL HIGH (ref 0–32)
AST: 48 IU/L — ABNORMAL HIGH (ref 0–40)
Albumin/Globulin Ratio: 1 — ABNORMAL LOW (ref 1.2–2.2)
Albumin: 4 g/dL (ref 3.7–4.7)
Alkaline Phosphatase: 107 IU/L (ref 48–121)
BUN/Creatinine Ratio: 26 (ref 12–28)
BUN: 33 mg/dL — ABNORMAL HIGH (ref 8–27)
Bilirubin Total: 0.3 mg/dL (ref 0.0–1.2)
CO2: 23 mmol/L (ref 20–29)
Calcium: 9.2 mg/dL (ref 8.7–10.3)
Chloride: 101 mmol/L (ref 96–106)
Creatinine, Ser: 1.26 mg/dL — ABNORMAL HIGH (ref 0.57–1.00)
GFR calc Af Amer: 50 mL/min/{1.73_m2} — ABNORMAL LOW (ref >59)
GFR calc non Af Amer: 43 mL/min/{1.73_m2} — ABNORMAL LOW (ref >59)
Globulin, Total: 3.9 g/dL (ref 1.5–4.5)
Glucose: 112 mg/dL — ABNORMAL HIGH (ref 65–99)
Potassium: 4.5 mmol/L (ref 3.5–5.2)
Sodium: 137 mmol/L (ref 134–144)
Total Protein: 7.9 g/dL (ref 6.0–8.5)

## 2020-03-04 LAB — LIPID PANEL
Chol/HDL Ratio: 3.6 ratio (ref 0.0–4.4)
Cholesterol, Total: 128 mg/dL (ref 100–199)
HDL: 36 mg/dL — ABNORMAL LOW (ref >39)
LDL Chol Calc (NIH): 62 mg/dL (ref 0–99)
Triglycerides: 180 mg/dL — ABNORMAL HIGH (ref 0–149)
VLDL Cholesterol Cal: 30 mg/dL (ref 5–40)

## 2020-03-13 ENCOUNTER — Telehealth: Payer: Self-pay

## 2020-03-13 DIAGNOSIS — E782 Mixed hyperlipidemia: Secondary | ICD-10-CM

## 2020-03-13 NOTE — Telephone Encounter (Signed)
-----   Message from Sherren Mocha, MD sent at 03/13/2020  7:53 AM EDT ----- Labs look good overall. Renal function is better than most recent labs. Lipids are at goal. LFT's mildly elevated would repeat LFT's in 6 months.

## 2020-03-13 NOTE — Telephone Encounter (Signed)
Reviewed results with patient who verbalized understanding.   Repeat LFTs scheduled 12/1. Discussed limiting NSAID use unless absolutely needed. The patient was grateful for assistance.

## 2020-04-09 ENCOUNTER — Other Ambulatory Visit: Payer: Self-pay | Admitting: Family Medicine

## 2020-04-10 ENCOUNTER — Other Ambulatory Visit: Payer: Self-pay | Admitting: *Deleted

## 2020-04-10 ENCOUNTER — Telehealth: Payer: Self-pay | Admitting: Family Medicine

## 2020-04-10 MED ORDER — ALLOPURINOL 100 MG PO TABS: 100.0000 mg | ORAL_TABLET | Freq: Every day | ORAL | 0 refills | Status: DC

## 2020-04-10 NOTE — Telephone Encounter (Signed)
Pt is calling in wanting to know why her Rx allopurinol 100 MG was denied and would like to have a call back.

## 2020-04-10 NOTE — Telephone Encounter (Signed)
Patient scheduled for Monday for a medication refills. Rx sent to pharmacy as requested.

## 2020-04-13 ENCOUNTER — Other Ambulatory Visit: Payer: Self-pay

## 2020-04-13 ENCOUNTER — Encounter: Payer: Self-pay | Admitting: Family Medicine

## 2020-04-13 ENCOUNTER — Ambulatory Visit (INDEPENDENT_AMBULATORY_CARE_PROVIDER_SITE_OTHER): Payer: Medicare Other | Admitting: Family Medicine

## 2020-04-13 VITALS — BP 130/80 | HR 64 | Temp 97.9°F | Resp 16 | Ht 65.0 in | Wt 233.4 lb

## 2020-04-13 DIAGNOSIS — I10 Essential (primary) hypertension: Secondary | ICD-10-CM

## 2020-04-13 DIAGNOSIS — M159 Polyosteoarthritis, unspecified: Secondary | ICD-10-CM

## 2020-04-13 DIAGNOSIS — N1831 Chronic kidney disease, stage 3a: Secondary | ICD-10-CM

## 2020-04-13 DIAGNOSIS — R600 Localized edema: Secondary | ICD-10-CM | POA: Diagnosis not present

## 2020-04-13 DIAGNOSIS — M109 Gout, unspecified: Secondary | ICD-10-CM | POA: Diagnosis not present

## 2020-04-13 DIAGNOSIS — K219 Gastro-esophageal reflux disease without esophagitis: Secondary | ICD-10-CM

## 2020-04-13 DIAGNOSIS — R7401 Elevation of levels of liver transaminase levels: Secondary | ICD-10-CM

## 2020-04-13 DIAGNOSIS — I25119 Atherosclerotic heart disease of native coronary artery with unspecified angina pectoris: Secondary | ICD-10-CM

## 2020-04-13 DIAGNOSIS — M8949 Other hypertrophic osteoarthropathy, multiple sites: Secondary | ICD-10-CM

## 2020-04-13 DIAGNOSIS — F419 Anxiety disorder, unspecified: Secondary | ICD-10-CM

## 2020-04-13 MED ORDER — HYDROCODONE-ACETAMINOPHEN 5-325 MG PO TABS: 1.0000 | ORAL_TABLET | Freq: Every day | ORAL | 0 refills | Status: DC | PRN

## 2020-04-13 MED ORDER — ALLOPURINOL 100 MG PO TABS: 100.0000 mg | ORAL_TABLET | Freq: Every day | ORAL | 2 refills | Status: DC

## 2020-04-13 MED ORDER — ALPRAZOLAM 0.25 MG PO TABS: 0.2500 mg | ORAL_TABLET | Freq: Every day | ORAL | 0 refills | Status: DC | PRN

## 2020-04-13 NOTE — Progress Notes (Signed)
HPI: Tracy [name] is a 74 y.o. female, who is here today for chronic disease management.  She was last seen on 05/15/19. Since her last visit she has followed with ortho,cardio,and gyn.  HLD:  + CAD. She is on Atorvastatin 40 mg. Zetia 10 mg was added. Tolerating medication well.  Lab Results  Component Value Date   CHOL 128 03/04/2020   HDL 36 (L) 03/04/2020   LDLCALC 62 03/04/2020   LDLDIRECT 111.0 01/05/2015   TRIG 180 (H) 03/04/2020   CHOLHDL 3.6 03/04/2020   LE edema, chronic, sometimes associated with "heavy" feeling. She is on Furosemide 40 mg daily but she has been taking 2 tabs for the past 3-4 days. Edema seems to be worse at the end of the day after prolonged standing,walking. LE pain at night, L>R. HypoK+: She is on KLOR 20 meq daily.  ?Orthopnea, sometimes. No PND. 2 pillows, same that usual.  HTN and CKD III: She is on Losartan 100 mg daily and Metoprolol Succinate 100 mg daily. Negative for unusual/frequant headache, visual changes, CP,palpitations,or focal deficit. SOB, exacerbated by exertion. 07/24/19 cardiac cath because SOB. According to pt it was fine.  Lab Results  Component Value Date   CREATININE 1.26 (H) 03/04/2020   BUN 33 (H) 03/04/2020   NA 137 03/04/2020   K 4.5 03/04/2020   CL 101 03/04/2020   CO2 23 03/04/2020    Pending lumpectomy right breast, planned for 04/28/20. She is not sure if she is having radiation therapy.  Chronic pain, knee OA and intermittent episodes of gout. IP hands and toes achy pain. She is also requesting refills on Hydrocodone-Acetaminophen 5-325 mg daily as needed. She take medication very seldom, usually 30 tab last a year.She still has some left for the last Rx. She has tolerated medication well.  Last Rx filled 10/29/18.  Anxiety: She is on Alprazolam 0.25 mg. She did not fill Alprazolam when refills sent in 10/2018. In average she takes 1/2 tab 2 times per week. Negative for  depression.  She is not sure about last gout attack . She is on Allopurinol 100 mg daily. Usually affects left MTP. She still has intermittent achy pain without edema or erythema. Exacerbated by certain shoe wear and walking.  Lab Results  Component Value Date   LABURIC 9.8 (H) 12/28/2015   Mildly elevated transaminases. Lab Results  Component Value Date   ALT 40 (H) 03/04/2020   AST 48 (H) 03/04/2020   ALKPHOS 107 03/04/2020   BILITOT 0.3 03/04/2020   Alcohol "once in a while." Every few months. No hx of alcohol abuse.  GERD: She is on Pantoprazole 20 mg daily. She was taking 40 mg daily and decreased to 20 mg in 06/2018.  Denies abdominal pain, nausea, vomiting, changes in bowel habits, blood in stool or melena.  Review of Systems  Constitutional: Negative for activity change, appetite change, chills and fever.  HENT: Negative for mouth sores and nosebleeds.   Endocrine: Negative for cold intolerance and heat intolerance.  Genitourinary: Negative for decreased urine volume, dysuria and hematuria.  Skin: Negative for rash and wound.  Neurological: Negative for syncope, facial asymmetry and weakness.  Rest of ROS, see pertinent positives sand negatives in HPI  Current Outpatient Medications on File Prior to Visit  Medication Sig Dispense Refill  . albuterol (PROVENTIL) (2.5 MG/3ML) 0.083% nebulizer solution Take 3 mLs (2.5 mg total) by nebulization every 6 (six) hours as needed for wheezing or shortness of breath.  300 mL 5  . ANORO ELLIPTA 62.5-25 MCG/INH AEPB TAKE 1 PUFF BY MOUTH EVERY DAY 60 each 12  . aspirin EC 81 MG tablet Take 81 mg by mouth daily.    Marland Kitchen atorvastatin (LIPITOR) 40 MG tablet Take 1 tablet (40 mg total) by mouth daily. 90 tablet 3  . calcium carbonate (CALCIUM 600) 600 MG TABS tablet Take 600 mg by mouth daily with breakfast.     . cetirizine (ZYRTEC) 10 MG tablet Take 10 mg by mouth daily.    . Cholecalciferol (VITAMIN D3 PO) Take 4,000 Units by mouth  daily.     . clopidogrel (PLAVIX) 75 MG tablet TAKE 1 TABLET (75 MG TOTAL) BY MOUTH DAILY WITH BREAKFAST. 90 tablet 3  . Cyanocobalamin (VITAMIN B-12) 5000 MCG SUBL Place 5,000 mcg under the tongue daily.     . diclofenac sodium (VOLTAREN) 1 % GEL Apply 4 g topically 4 (four) times daily. 4 Tube 3  . ezetimibe (ZETIA) 10 MG tablet Take 1 tablet (10 mg total) by mouth daily. 90 tablet 3  . fluticasone (FLONASE) 50 MCG/ACT nasal spray SPRAY 2 SPRAYS INTO EACH NOSTRIL EVERY DAY 48 mL 1  . furosemide (LASIX) 40 MG tablet Take 40 mg by mouth daily.    Marland Kitchen losartan (COZAAR) 100 MG tablet Take 100 mg by mouth daily.    . metoprolol succinate (TOPROL-XL) 100 MG 24 hr tablet Take 1 tablet (100 mg total) by mouth daily. Take with or immediately following a meal. 90 tablet 3  . Misc Natural Products (TART CHERRY ADVANCED) CAPS Take 1,000 capsules by mouth daily.     . nitroGLYCERIN (NITROSTAT) 0.4 MG SL tablet PLACE 1 TABLET (0.4 MG TOTAL) UNDER THE TONGUE EVERY 5 (FIVE) MINUTES AS NEEDED FOR CHEST PAIN. 25 tablet 3  . pantoprazole (PROTONIX) 20 MG tablet TAKE 1 TABLET BY MOUTH EVERY DAY 90 tablet 1  . potassium chloride SA (KLOR-CON M20) 20 MEQ tablet Take 1 tablet (20 mEq total) by mouth 2 (two) times daily. 180 tablet 1  . VENTOLIN HFA 108 (90 Base) MCG/ACT inhaler INHALE 2 PUFFS INTO THE LUNGS EVERY 4 (FOUR) HOURS AS NEEDED FOR WHEEZING OR SHORTNESS OF BREATH. 18 g 5  . Vitamin E 180 MG CAPS Take 180 Units by mouth daily.      No current facility-administered medications on file prior to visit.   Past Medical History:  Diagnosis Date  . Allergic rhinitis   . CAD (coronary artery disease)    1/19 PCI/DES to [place] for ISR, normal EF.   Marland Kitchen CHF (congestive heart failure) ([place])    Echo 06/2019: EF 55-60, elevated LVEDP, normal RV SF, mild MAC, mild MR, trivial TR  . Gout   . Headache(784.0)   . Hyperlipidemia   . Hypertension   . Low back pain   . Menopausal syndrome   . Myocardial infarct ([place]) 2007    hx of  . Overactive bladder    Allergies  Allergen Reactions  . Erythromycin Rash    But isn't certain  . Sulfamethoxazole Rash    Social History   Socioeconomic History  . Marital status: Married    Spouse name: Not on file  . Number of children: Not on file  . Years of education: Not on file  . Highest education level: Not on file  Occupational History  . Occupation: direct Civil engineer, contracting  Tobacco Use  . Smoking status: Former Smoker    Packs/day: 0.20  Years: 50.00    Pack years: 10.00    Quit date: 07/01/2012    Years since quitting: 7.7  . Smokeless tobacco: Never Used  . Tobacco comment: completely quit May of 2018; period of years she did not smoke   Vaping Use  . Vaping Use: Never used  Substance and Sexual Activity  . Alcohol use: No  . Drug use: No  . Sexual activity: Not on file  Other Topics Concern  . Not on file  Social History Narrative  . Not on file   Social Determinants of Health   Financial Resource Strain:   . Difficulty of Paying Living Expenses:   Food Insecurity:   . Worried About Charity fundraiser in the Last Year:   . Arboriculturist in the Last Year:   Transportation Needs:   . Film/video editor (Medical):   Marland Kitchen Lack of Transportation (Non-Medical):   Physical Activity:   . Days of Exercise per Week:   . Minutes of Exercise per Session:   Stress:   . Feeling of Stress :   Social Connections:   . Frequency of Communication with Friends and Family:   . Frequency of Social Gatherings with Friends and Family:   . Attends Religious Services:   . Active Member of Clubs or Organizations:   . Attends Archivist Meetings:   Marland Kitchen Marital Status:     Vitals:   04/13/20 1057  BP: 130/80  Pulse: 64  Resp: 16  Temp: 97.9 F (36.6 C)  SpO2: 94%   Body mass index is 38.84 kg/m.  Physical Exam Vitals and nursing note reviewed.  Constitutional:      General: She is not in acute distress.    Appearance:  She is well-developed.  HENT:     Head: Normocephalic and atraumatic.     Mouth/Throat:     Mouth: Mucous membranes are moist.     Pharynx: Oropharynx is clear.  Eyes:     Conjunctiva/sclera: Conjunctivae normal.     Pupils: Pupils are equal, round, and reactive to light.  Cardiovascular:     Rate and Rhythm: Normal rate and regular rhythm.     Pulses:          Dorsalis pedis pulses are 2+ on the right side and 2+ on the left side.     Heart sounds: No murmur heard.      Comments: L>R 1+ pitting LE edema. Pulmonary:     Effort: Pulmonary effort is normal. No respiratory distress.     Breath sounds: Normal breath sounds.  Abdominal:     Palpations: Abdomen is soft. There is no hepatomegaly or mass.     Tenderness: There is no abdominal tenderness.  Lymphadenopathy:     Cervical: No cervical adenopathy.  Skin:    General: Skin is warm.     Findings: No erythema or rash.  Neurological:     Mental Status: She is alert and oriented to person, place, and time.     Cranial Nerves: No cranial nerve deficit.     Comments: Stable gait, antalgic ,not assisted.  Psychiatric:     Comments: Well groomed, good eye contact.    ASSESSMENT AND PLAN:   Ms. DARITZA BREES was seen today for chronic disease management.  Diagnoses and all orders for this visit:  Gastroesophageal reflux disease without esophagitis Problem is well controlled. Continue Protonix 20 mg daily. GERD precautions are also recommended.  Bilateral lower extremity  edema We discussed possible etiologies. Side effects of Furosemide reviewed, recommend continuing Furosemide 40 mg daily, an extra if edema is worse. LE elevation and compression stocking may also help. Low salt diet.  Stage 3a chronic kidney disease Problem has been stable.Cr 1.2-1.3, eGFR high 30's-mid 40's,  Adequate BP controlled and hydration. Low salt diet. Caution with diuretic therapy.  Gouty arthritis of toe of left foot Problem  seems to be well controlled. We discussed the difference between gout and OA. Diuretic may aggravate problem. Continue Allopurinol 100 mg daily. Uric acid to be added to next blood work. Continue low purine diet.  -     allopurinol (ZYLOPRIM) 100 MG tablet; Take 1 tablet (100 mg total) by mouth daily.  Anxiety disorder, unspecified type Problem is stable. No changes in current management. We discussed side effects and the risk of potentialization of these when taking with opioid meds. 30 tabs last a year, follow up in a year or before if more refills are needed.  -     ALPRAZolam (XANAX) 0.25 MG tablet; Take 1 tablet (0.25 mg total) by mouth daily as needed for anxiety. TAKE 1 TABLET TWICE A DAY AS NEEDED FOR ANXIETY  Primary osteoarthritis involving multiple joints Stable. Wt loss will help. 30 tabs of Hydrocodone-Acetaminophen last a year, so I will see her back in a year unless she runs out of Rx befoe. We discussed side effects and current recommendations in regard to chronic opioid use. PDMP reviewed.  -     HYDROcodone-acetaminophen (NORCO/VICODIN) 5-325 MG tablet; Take 1 tablet by mouth daily as needed for moderate pain.  Essential hypertension BP adequately controlled. No changes in current management. Continue low salt diet.  Elevated transaminase level Mild. There is a LFT's future order placed by Dr Burt Knack. ? Fatty liver. If persistent we will need hep screening + others and RUQ [place].  She sees her cardiologist regularly through the year, so I will see ehr in a year, before if needed.   Return for Medicare visit needed. F/U on meds in a year..   Cyanna Neace G. [name], MD  [facility]. [place] office.  A few things to remember from today's visit:  Uric acid to be checked at Dr Antionette Char office in 5-6 months. You need Medicare visit. Caution with Furosemide.You can take an extra if worsening swelling affecting skin. Compression stocking for  swelling.  No changes in rest of your meds. Follow up in a year for meds, before if running out.  If you need refills please call your pharmacy. Do not use My Chart to request refills or for acute issues that need immediate attention.    Please be sure medication list is accurate. If a new problem present, please set up appointment sooner than planned today.

## 2020-04-13 NOTE — Patient Instructions (Addendum)
A few things to remember from today's visit:  Uric acid to be checked at Dr Antionette Char office in 5-6 months. You need Medicare visit. Caution with Furosemide.You can take an extra if worsening swelling affecting skin. Compression stocking for swelling.  No changes in rest of your meds. Follow up in a year for meds, before if running out.  If you need refills please call your pharmacy. Do not use My Chart to request refills or for acute issues that need immediate attention.    Please be sure medication list is accurate. If a new problem present, please set up appointment sooner than planned today.

## 2020-04-16 ENCOUNTER — Encounter: Payer: Self-pay | Admitting: Family Medicine

## 2020-04-16 HISTORY — PX: BREAST EXCISIONAL BIOPSY: SUR124

## 2020-04-21 ENCOUNTER — Encounter (HOSPITAL_BASED_OUTPATIENT_CLINIC_OR_DEPARTMENT_OTHER): Payer: Self-pay | Admitting: Surgery

## 2020-04-21 ENCOUNTER — Other Ambulatory Visit: Payer: Self-pay

## 2020-04-24 ENCOUNTER — Encounter (HOSPITAL_BASED_OUTPATIENT_CLINIC_OR_DEPARTMENT_OTHER)
Admission: RE | Admit: 2020-04-24 | Discharge: 2020-04-24 | Disposition: A | Discharge status: Home / Self Care | Payer: Medicare Other | Source: Ambulatory Visit | Attending: Surgery | Admitting: Surgery

## 2020-04-24 ENCOUNTER — Other Ambulatory Visit (HOSPITAL_COMMUNITY)
Admission: RE | Admit: 2020-04-24 | Discharge: 2020-04-24 | Disposition: A | Discharge status: Home / Self Care | Payer: Medicare Other | Source: Ambulatory Visit | Attending: Surgery | Admitting: Surgery

## 2020-04-24 DIAGNOSIS — Z01812 Encounter for preprocedural laboratory examination: Secondary | ICD-10-CM | POA: Insufficient documentation

## 2020-04-24 DIAGNOSIS — Z20822 Contact with and (suspected) exposure to covid-19: Secondary | ICD-10-CM | POA: Insufficient documentation

## 2020-04-24 LAB — BASIC METABOLIC PANEL
Anion gap: 9 (ref 5–15)
BUN: 35 mg/dL — ABNORMAL HIGH (ref 8–23)
CO2: 21 mmol/L — ABNORMAL LOW (ref 22–32)
Calcium: 9.2 mg/dL (ref 8.9–10.3)
Chloride: 111 mmol/L (ref 98–111)
Creatinine, Ser: 1.13 mg/dL — ABNORMAL HIGH (ref 0.44–1.00)
GFR calc Af Amer: 56 mL/min — ABNORMAL LOW (ref >60)
GFR calc non Af Amer: 49 mL/min — ABNORMAL LOW (ref >60)
Glucose, Bld: 108 mg/dL — ABNORMAL HIGH (ref 70–99)
Potassium: 4.9 mmol/L (ref 3.5–5.1)
Sodium: 141 mmol/L (ref 135–145)

## 2020-04-24 LAB — SARS CORONAVIRUS 2 (TAT 6-24 HRS): SARS Coronavirus 2: NEGATIVE

## 2020-04-24 NOTE — Progress Notes (Signed)
      Enhanced Recovery after Surgery for Orthopedics Enhanced Recovery after Surgery is a protocol used to improve the stress on your body and your recovery after surgery.  Patient Instructions  . The night before surgery:  o No food after midnight. ONLY clear liquids after midnight  . The day of surgery (if you do NOT have diabetes):  o Drink ONE (1) Pre-Surgery Clear Ensure as directed.   o This drink was given to you during your hospital  pre-op appointment visit. o The pre-op nurse will instruct you on the time to drink the  Pre-Surgery Ensure depending on your surgery time. o Finish the drink at the designated time by the pre-op nurse.  o Nothing else to drink after completing the  Pre-Surgery Clear Ensure.  . The day of surgery (if you have diabetes): o Drink ONE (1) Gatorade 2 (G2) as directed. o This drink was given to you during your hospital  pre-op appointment visit.  o The pre-op nurse will instruct you on the time to drink the   Gatorade 2 (G2) depending on your surgery time. o Color of the Gatorade may vary. Red is not allowed. o Nothing else to drink after completing the  Gatorade 2 (G2).         If you have questions, please contact your surgeon's office.  Surgical soap given to patient with instructions for use.  Patient verbalized understanding of instructions.  

## 2020-04-27 ENCOUNTER — Other Ambulatory Visit: Payer: Self-pay

## 2020-04-27 ENCOUNTER — Ambulatory Visit
Admission: RE | Admit: 2020-04-27 | Discharge: 2020-04-27 | Disposition: A | Discharge status: Home / Self Care | Payer: Medicare Other | Source: Ambulatory Visit | Attending: Surgery | Admitting: Surgery

## 2020-04-27 DIAGNOSIS — N6091 Unspecified benign mammary dysplasia of right breast: Secondary | ICD-10-CM

## 2020-04-27 NOTE — Anesthesia Preprocedure Evaluation (Addendum)
Anesthesia Evaluation  Patient identified by MRN, date of birth, ID band Patient awake    Reviewed: Allergy & Precautions, NPO status , Patient's Chart, lab work & pertinent test results, reviewed documented beta blocker date and time   History of Anesthesia Complications Negative for: history of anesthetic complications  Airway Mallampati: II  TM Distance: >3 FB Neck ROM: Full    Dental no notable dental hx.    Pulmonary COPD,  COPD inhaler, former smoker,    Pulmonary exam normal        Cardiovascular hypertension, Pt. on medications and Pt. on home beta blockers + CAD (on Plavix), + Past MI (2007), + Cardiac Stents (2019) and +CHF  Normal cardiovascular exam  TTE 06/2019: EF 55-60%, elevated LVEDP, mild MR   Neuro/Psych Anxiety negative neurological ROS     GI/Hepatic Neg liver ROS, GERD  Medicated and Controlled,  Endo/Other  BMI 39  Renal/GU Renal InsufficiencyRenal disease (Cr 1.13)  negative genitourinary   Musculoskeletal  (+) Arthritis ,   Abdominal   Peds  Hematology negative hematology ROS (+)   Anesthesia Other Findings Day of surgery medications reviewed with patient.  Reproductive/Obstetrics negative OB ROS                           Anesthesia Physical Anesthesia Plan  ASA: III  Anesthesia Plan: General   Post-op Pain Management:    Induction: Intravenous  PONV Risk Score and Plan: 3 and Treatment may vary due to age or medical condition, Ondansetron and Dexamethasone  Airway Management Planned: LMA  Additional Equipment: None  Intra-op Plan:   Post-operative Plan: Extubation in OR  Informed Consent: I have reviewed the patients History and Physical, chart, labs and discussed the procedure including the risks, benefits and alternatives for the proposed anesthesia with the patient or authorized representative who has indicated his/her understanding and acceptance.      Dental advisory given  Plan Discussed with: CRNA  Anesthesia Plan Comments:        Anesthesia Quick Evaluation

## 2020-04-28 ENCOUNTER — Other Ambulatory Visit: Payer: Self-pay

## 2020-04-28 ENCOUNTER — Ambulatory Visit (HOSPITAL_BASED_OUTPATIENT_CLINIC_OR_DEPARTMENT_OTHER): Payer: Medicare Other | Admitting: Anesthesiology

## 2020-04-28 ENCOUNTER — Ambulatory Visit
Admission: RE | Admit: 2020-04-28 | Discharge: 2020-04-28 | Disposition: A | Discharge status: Home / Self Care | Payer: Medicare Other | Source: Ambulatory Visit | Attending: Surgery | Admitting: Surgery

## 2020-04-28 ENCOUNTER — Encounter (HOSPITAL_BASED_OUTPATIENT_CLINIC_OR_DEPARTMENT_OTHER): Payer: Self-pay | Admitting: Surgery

## 2020-04-28 ENCOUNTER — Encounter (HOSPITAL_BASED_OUTPATIENT_CLINIC_OR_DEPARTMENT_OTHER)
Admission: RE | Disposition: A | Discharge status: Home / Self Care | Payer: Self-pay | Source: Home / Self Care | Attending: Surgery

## 2020-04-28 ENCOUNTER — Ambulatory Visit (HOSPITAL_BASED_OUTPATIENT_CLINIC_OR_DEPARTMENT_OTHER)
Admission: RE | Admit: 2020-04-28 | Discharge: 2020-04-28 | Disposition: A | Discharge status: Home / Self Care | Payer: Medicare Other | Attending: Surgery | Admitting: Surgery

## 2020-04-28 DIAGNOSIS — K219 Gastro-esophageal reflux disease without esophagitis: Secondary | ICD-10-CM | POA: Insufficient documentation

## 2020-04-28 DIAGNOSIS — Z881 Allergy status to other antibiotic agents status: Secondary | ICD-10-CM | POA: Diagnosis not present

## 2020-04-28 DIAGNOSIS — J449 Chronic obstructive pulmonary disease, unspecified: Secondary | ICD-10-CM | POA: Diagnosis not present

## 2020-04-28 DIAGNOSIS — C50919 Malignant neoplasm of unspecified site of unspecified female breast: Secondary | ICD-10-CM

## 2020-04-28 DIAGNOSIS — I252 Old myocardial infarction: Secondary | ICD-10-CM | POA: Diagnosis not present

## 2020-04-28 DIAGNOSIS — Z79899 Other long term (current) drug therapy: Secondary | ICD-10-CM | POA: Insufficient documentation

## 2020-04-28 DIAGNOSIS — Z87891 Personal history of nicotine dependence: Secondary | ICD-10-CM | POA: Insufficient documentation

## 2020-04-28 DIAGNOSIS — N6091 Unspecified benign mammary dysplasia of right breast: Secondary | ICD-10-CM | POA: Diagnosis not present

## 2020-04-28 DIAGNOSIS — I509 Heart failure, unspecified: Secondary | ICD-10-CM | POA: Insufficient documentation

## 2020-04-28 DIAGNOSIS — I110 Hypertensive heart disease with heart failure: Secondary | ICD-10-CM | POA: Diagnosis not present

## 2020-04-28 DIAGNOSIS — E7800 Pure hypercholesterolemia, unspecified: Secondary | ICD-10-CM | POA: Insufficient documentation

## 2020-04-28 DIAGNOSIS — M1990 Unspecified osteoarthritis, unspecified site: Secondary | ICD-10-CM | POA: Diagnosis not present

## 2020-04-28 DIAGNOSIS — D241 Benign neoplasm of right breast: Secondary | ICD-10-CM | POA: Insufficient documentation

## 2020-04-28 DIAGNOSIS — Z7902 Long term (current) use of antithrombotics/antiplatelets: Secondary | ICD-10-CM | POA: Insufficient documentation

## 2020-04-28 DIAGNOSIS — Z882 Allergy status to sulfonamides status: Secondary | ICD-10-CM | POA: Diagnosis not present

## 2020-04-28 DIAGNOSIS — D0501 Lobular carcinoma in situ of right breast: Secondary | ICD-10-CM | POA: Diagnosis not present

## 2020-04-28 HISTORY — DX: Gastro-esophageal reflux disease without esophagitis: K21.9

## 2020-04-28 HISTORY — PX: BREAST LUMPECTOMY WITH RADIOACTIVE SEED LOCALIZATION: SHX6424

## 2020-04-28 HISTORY — DX: Chronic obstructive pulmonary disease, unspecified: J44.9

## 2020-04-28 HISTORY — DX: Malignant neoplasm of unspecified site of unspecified female breast: C50.919

## 2020-04-28 SURGERY — BREAST LUMPECTOMY WITH RADIOACTIVE SEED LOCALIZATION
Anesthesia: General | Site: Breast | Laterality: Right

## 2020-04-28 MED ORDER — CEFAZOLIN SODIUM-DEXTROSE 2-4 GM/100ML-% IV SOLN
2.0000 g | INTRAVENOUS | Status: AC
  Administered 2020-04-28: 2 g via INTRAVENOUS

## 2020-04-28 MED ORDER — FENTANYL CITRATE (PF) 100 MCG/2ML IJ SOLN
INTRAMUSCULAR | Status: AC
  Filled 2020-04-28: qty 2

## 2020-04-28 MED ORDER — OXYCODONE HCL 5 MG/5ML PO SOLN: 5.0000 mg | Freq: Once | ORAL | Status: DC | PRN

## 2020-04-28 MED ORDER — FENTANYL CITRATE (PF) 100 MCG/2ML IJ SOLN
25.0000 ug | INTRAMUSCULAR | Status: DC | PRN
  Administered 2020-04-28: 25 ug via INTRAVENOUS

## 2020-04-28 MED ORDER — CHLORHEXIDINE GLUCONATE CLOTH 2 % EX PADS: 6.0000 | MEDICATED_PAD | Freq: Once | CUTANEOUS | Status: DC

## 2020-04-28 MED ORDER — PHENYLEPHRINE HCL-NACL 10-0.9 MG/250ML-% IV SOLN
INTRAVENOUS | Status: DC | PRN
  Administered 2020-04-28: 40 ug/min via INTRAVENOUS

## 2020-04-28 MED ORDER — BUPIVACAINE HCL (PF) 0.25 % IJ SOLN
INTRAMUSCULAR | Status: AC
  Filled 2020-04-28: qty 120

## 2020-04-28 MED ORDER — OXYCODONE HCL 5 MG PO TABS: 5.0000 mg | ORAL_TABLET | Freq: Once | ORAL | Status: DC | PRN

## 2020-04-28 MED ORDER — PHENYLEPHRINE 40 MCG/ML (10ML) SYRINGE FOR IV PUSH (FOR BLOOD PRESSURE SUPPORT)
PREFILLED_SYRINGE | INTRAVENOUS | Status: DC | PRN
  Administered 2020-04-28: 80 ug via INTRAVENOUS
  Administered 2020-04-28 (×2): 120 ug via INTRAVENOUS

## 2020-04-28 MED ORDER — LACTATED RINGERS IV SOLN
INTRAVENOUS | Status: DC | PRN
Start: 2020-04-28 — End: 2020-04-28

## 2020-04-28 MED ORDER — ACETAMINOPHEN 500 MG PO TABS
1000.0000 mg | ORAL_TABLET | Freq: Once | ORAL | Status: AC
  Administered 2020-04-28: 1000 mg via ORAL

## 2020-04-28 MED ORDER — GABAPENTIN 300 MG PO CAPS: 300.0000 mg | ORAL_CAPSULE | ORAL | Status: DC

## 2020-04-28 MED ORDER — ACETAMINOPHEN 500 MG PO TABS
ORAL_TABLET | ORAL | Status: AC
  Filled 2020-04-28: qty 2

## 2020-04-28 MED ORDER — PROPOFOL 10 MG/ML IV BOLUS
INTRAVENOUS | Status: AC
  Filled 2020-04-28: qty 20

## 2020-04-28 MED ORDER — PROMETHAZINE HCL 25 MG/ML IJ SOLN: 6.2500 mg | INTRAMUSCULAR | Status: DC | PRN

## 2020-04-28 MED ORDER — PROPOFOL 10 MG/ML IV BOLUS
INTRAVENOUS | Status: DC | PRN
  Administered 2020-04-28: 180 mg via INTRAVENOUS

## 2020-04-28 MED ORDER — CEFAZOLIN SODIUM-DEXTROSE 2-4 GM/100ML-% IV SOLN
INTRAVENOUS | Status: AC
  Filled 2020-04-28: qty 100

## 2020-04-28 MED ORDER — LACTATED RINGERS IV SOLN: INTRAVENOUS | Status: DC

## 2020-04-28 MED ORDER — ONDANSETRON HCL 4 MG/2ML IJ SOLN
INTRAMUSCULAR | Status: DC | PRN
  Administered 2020-04-28: 4 mg via INTRAVENOUS

## 2020-04-28 MED ORDER — FENTANYL CITRATE (PF) 100 MCG/2ML IJ SOLN
INTRAMUSCULAR | Status: DC | PRN
  Administered 2020-04-28: 100 ug via INTRAVENOUS

## 2020-04-28 MED ORDER — BUPIVACAINE HCL 0.25 % IJ SOLN
INTRAMUSCULAR | Status: DC | PRN
  Administered 2020-04-28: 10 mL

## 2020-04-28 MED ORDER — LIDOCAINE 2% (20 MG/ML) 5 ML SYRINGE
INTRAMUSCULAR | Status: DC | PRN
  Administered 2020-04-28: 100 mg via INTRAVENOUS

## 2020-04-28 MED ORDER — DEXAMETHASONE SODIUM PHOSPHATE 10 MG/ML IJ SOLN
INTRAMUSCULAR | Status: DC | PRN
  Administered 2020-04-28: 5 mg via INTRAVENOUS

## 2020-04-28 MED ORDER — ACETAMINOPHEN 500 MG PO TABS: 1000.0000 mg | ORAL_TABLET | ORAL | Status: AC

## 2020-04-28 SURGICAL SUPPLY — 43 items
APPLIER CLIP 9.375 MED OPEN (MISCELLANEOUS) ×2
BENZOIN TINCTURE PRP APPL 2/3 (GAUZE/BANDAGES/DRESSINGS) ×2 IMPLANT
BLADE HEX COATED 2.75 (ELECTRODE) ×2 IMPLANT
BLADE SURG 15 STRL LF DISP TIS (BLADE) ×1 IMPLANT
BLADE SURG 15 STRL SS (BLADE) ×2
CANISTER SUC SOCK COL 7IN (MISCELLANEOUS) IMPLANT
CANISTER SUCT 1200ML W/VALVE (MISCELLANEOUS) IMPLANT
CHLORAPREP W/TINT 26 (MISCELLANEOUS) ×2 IMPLANT
CLIP APPLIE 9.375 MED OPEN (MISCELLANEOUS) ×1 IMPLANT
COVER BACK TABLE 60X90IN (DRAPES) ×2 IMPLANT
COVER MAYO STAND STRL (DRAPES) ×2 IMPLANT
COVER PROBE W GEL 5X96 (DRAPES) ×2 IMPLANT
DRAPE LAPAROTOMY 100X72 PEDS (DRAPES) ×2 IMPLANT
DRAPE UTILITY XL STRL (DRAPES) ×2 IMPLANT
DRSG TEGADERM 4X4.75 (GAUZE/BANDAGES/DRESSINGS) ×2 IMPLANT
ELECT BLADE 4.0 EZ CLEAN MEGAD (MISCELLANEOUS) ×2
ELECT REM PT RETURN 9FT ADLT (ELECTROSURGICAL) ×2
ELECTRODE BLDE 4.0 EZ CLN MEGD (MISCELLANEOUS) ×1 IMPLANT
ELECTRODE REM PT RTRN 9FT ADLT (ELECTROSURGICAL) ×1 IMPLANT
GAUZE SPONGE 4X4 12PLY STRL LF (GAUZE/BANDAGES/DRESSINGS) ×2 IMPLANT
GLOVE BIO SURGEON STRL SZ 6.5 (GLOVE) ×2 IMPLANT
GLOVE BIO SURGEON STRL SZ7 (GLOVE) ×2 IMPLANT
GLOVE BIOGEL PI IND STRL 7.5 (GLOVE) ×1 IMPLANT
GLOVE BIOGEL PI INDICATOR 7.5 (GLOVE) ×1
GOWN STRL REUS W/ TWL LRG LVL3 (GOWN DISPOSABLE) ×2 IMPLANT
GOWN STRL REUS W/TWL LRG LVL3 (GOWN DISPOSABLE) ×4
KIT MARKER MARGIN INK (KITS) ×2 IMPLANT
NEEDLE HYPO 25X1 1.5 SAFETY (NEEDLE) ×2 IMPLANT
NS IRRIG 1000ML POUR BTL (IV SOLUTION) ×2 IMPLANT
PACK BASIN DAY SURGERY FS (CUSTOM PROCEDURE TRAY) ×2 IMPLANT
PENCIL SMOKE EVACUATOR (MISCELLANEOUS) ×2 IMPLANT
SLEEVE SCD COMPRESS KNEE MED (MISCELLANEOUS) ×2 IMPLANT
SPONGE GAUZE 2X2 8PLY STRL LF (GAUZE/BANDAGES/DRESSINGS) IMPLANT
SPONGE LAP 4X18 RFD (DISPOSABLE) ×2 IMPLANT
STRIP CLOSURE SKIN 1/2X4 (GAUZE/BANDAGES/DRESSINGS) ×2 IMPLANT
SUT MON AB 4-0 PC3 18 (SUTURE) ×2 IMPLANT
SUT VIC AB 3-0 SH 27 (SUTURE) ×2
SUT VIC AB 3-0 SH 27X BRD (SUTURE) ×1 IMPLANT
SYR CONTROL 10ML LL (SYRINGE) ×2 IMPLANT
TOWEL GREEN STERILE FF (TOWEL DISPOSABLE) ×2 IMPLANT
TRAY FAXITRON CT DISP (TRAY / TRAY PROCEDURE) ×2 IMPLANT
TUBE CONNECTING 20X1/4 (TUBING) IMPLANT
YANKAUER SUCT BULB TIP NO VENT (SUCTIONS) IMPLANT

## 2020-04-28 NOTE — Op Note (Signed)
Pre-op Diagnosis:  Atypical ductal hyperplasia and LCIS right breast Post-op Diagnosis: same Procedure:  Right radioactive seed localized lumpectomy x 2 Surgeon:  Donnie Mesa K. Anesthesia:  GEN - LMA Indications:  This is a 74 year old female who presents after recent screening mammogram that showed a 2.6 cm area of calcifications in the right breast. Further evaluation revealed two areas in the right upper outer quadrant that were questionable. Both were biopsied. The more posterior area showed a fibroadenoma. The more anterior mass was ADH with LCIS and associated fibroadenoma.  Two seeds were placed yesterday by radiology.  Description of procedure: The patient is brought to the operating room placed in supine position on the operating room table. After an adequate level of general anesthesia was obtained, her right breast was prepped with ChloraPrep and draped in sterile fashion. A timeout was taken to ensure the proper patient and proper procedure. We interrogated the breast with the neoprobe. We made a transverse incision around the lateral side of the right breast after infiltrating with 0.25% Marcaine. Dissection was carried down in the breast tissue with cautery. We used the neoprobe to guide [place] towards the radioactive seeds. We excised an area of tissue around both radioactive seeds. The specimen was removed and was oriented with a paint kit. Specimen mammogram showed both radioactive seeds as well as one of the biopsy clip within the specimen.  The other biopsy clip had migrated and was not included in the specimen. The specimen was sent for pathologic examination. There is no residual radioactivity within the biopsy cavity. We inspected carefully for hemostasis. The wound was thoroughly irrigated. The wound was closed with a deep layer of 3-0 Vicryl and a subcuticular layer of 4-0 Monocryl. Benzoin Steri-Strips were applied. The patient was then extubated and brought to the recovery room  in stable condition. All sponge, instrument, and needle counts are correct.  Imogene Burn. Georgette Dover, MD, [facility]  General/ Trauma Surgery  04/28/2020 8:29 AM

## 2020-04-28 NOTE — Discharge Instructions (Signed)
[place] Office Phone Number [phone]  BREAST BIOPSY/ PARTIAL MASTECTOMY: POST OP INSTRUCTIONS  Always review your discharge instruction sheet given to you by the facility where your surgery was performed.  IF YOU HAVE DISABILITY OR FAMILY LEAVE FORMS, YOU MUST BRING THEM TO THE OFFICE FOR PROCESSING.  DO NOT GIVE THEM TO YOUR DOCTOR.  1. A prescription for pain medication may be given to you upon discharge.  Take your pain medication as prescribed, if needed.  If narcotic pain medicine is not needed, then you may take acetaminophen (Tylenol) or ibuprofen (Advil) as needed. 2. Take your usually prescribed medications unless otherwise directed 3. If you need a refill on your pain medication, please contact your pharmacy.  They will contact our office to request authorization.  Prescriptions will not be filled after 5pm or on week-ends. 4. You should eat very light the first 24 hours after surgery, such as soup, crackers, pudding, etc.  Resume your normal diet the day after surgery. 5. Most patients will experience some swelling and bruising in the breast.  Ice packs and a good support bra will help.  Swelling and bruising can take several days to resolve.  6. It is common to experience some constipation if taking pain medication after surgery.  Increasing fluid intake and taking a stool softener will usually help or prevent this problem from occurring.  A mild laxative (Milk of Magnesia or Miralax) should be taken according to package directions if there are no bowel movements after 48 hours. 7. Unless discharge instructions indicate otherwise, you may remove your bandages 24-48 hours after surgery, and you may shower at that time.  You may have steri-strips (small skin tapes) in place directly over the incision.  These strips should be left on the skin for 7-10 days.  If your surgeon used skin glue on the incision, you may shower in 24 hours.  The glue will flake off over the  next 2-3 weeks.  Any sutures or staples will be removed at the office during your follow-up visit. 8. ACTIVITIES:  You may resume regular daily activities (gradually increasing) beginning the next day.  Wearing a good support bra or sports bra minimizes pain and swelling.  You may have sexual intercourse when it is comfortable. a. You may drive when you no longer are taking prescription pain medication, you can comfortably wear a seatbelt, and you can safely maneuver your car and apply brakes. b. RETURN TO WORK:  ______________________________________________________________________________________ 9. You should see your doctor in the office for a follow-up appointment approximately two weeks after your surgery.  Your doctor's nurse will typically make your follow-up appointment when she calls you with your pathology report.  Expect your pathology report 2-3 business days after your surgery.  You may call to check if you do not hear from [place] after three days. 10. OTHER INSTRUCTIONS: _______________________________________________________________________________________________ _____________________________________________________________________________________________________________________________________ _____________________________________________________________________________________________________________________________________ _____________________________________________________________________________________________________________________________________  WHEN TO CALL YOUR DOCTOR: 1. Fever over 101.0 2. Nausea and/or vomiting. 3. Extreme swelling or bruising. 4. Continued bleeding from incision. 5. Increased pain, redness, or drainage from the incision.  The clinic staff is available to answer your questions during regular business hours.  Please don't hesitate to call and ask to speak to one of the nurses for clinical concerns.  If you have a medical emergency, go to the nearest  emergency room or call 911.  A surgeon from [facility] is always on call at the hospital.  For further questions, please visit [email or web address]  Patient to start back on Plavix tomorrow NO TYLENOL PRODUCTS UNTIL 12:45 PM    Post Anesthesia Home Care Instructions  Activity: Get plenty of rest for the remainder of the day. A responsible individual must stay with you for 24 hours following the procedure.  For the next 24 hours, DO NOT: -Drive a car -Paediatric nurse -Drink alcoholic beverages -Take any medication unless instructed by your physician -Make any legal decisions or sign important papers.  Meals: Start with liquid foods such as gelatin or soup. Progress to regular foods as tolerated. Avoid greasy, spicy, heavy foods. If nausea and/or vomiting occur, drink only clear liquids until the nausea and/or vomiting subsides. Call your physician if vomiting continues.  Special Instructions/Symptoms: Your throat may feel dry or sore from the anesthesia or the breathing tube placed in your throat during surgery. If this causes discomfort, gargle with warm salt water. The discomfort should disappear within 24 hours.  If you had a scopolamine patch placed behind your ear for the management of post- operative nausea and/or vomiting:  1. The medication in the patch is effective for 72 hours, after which it should be removed.  Wrap patch in a tissue and discard in the trash. Wash hands thoroughly with soap and water. 2. You may remove the patch earlier than 72 hours if you experience unpleasant side effects which may include dry mouth, dizziness or visual disturbances. 3. Avoid touching the patch. Wash your hands with soap and water after contact with the patch.

## 2020-04-28 NOTE — Transfer of Care (Signed)
Immediate Anesthesia Transfer of Care Note  Patient: Tracy [name]  Procedure(s) Performed: RIGHT BREAST LUMPECTOMY X 2  WITH RADIOACTIVE SEED LOCALIZATION (Right Breast)  Patient Location: PACU  Anesthesia Type:General  Level of Consciousness: awake, alert  and oriented  Airway & Oxygen Therapy: Patient Spontanous Breathing and Patient connected to nasal cannula oxygen  Post-op Assessment: Report given to RN and Post -op Vital signs reviewed and stable  Post vital signs: Reviewed and stable  Last Vitals:  Vitals Value Taken Time  BP 137/76 04/28/20 0831  Temp    Pulse 71 04/28/20 0834  Resp 12 04/28/20 0834  SpO2 98 % 04/28/20 0834  Vitals shown include unvalidated device data.  Last Pain:  Vitals:   04/28/20 0640  TempSrc: Oral  PainSc: 5       Patients Stated Pain Goal: 3 (29/57/47 3403)  Complications: No complications documented.

## 2020-04-28 NOTE — H&P (Signed)
History of Present Illness  The patient is a 74 year old female who presents with a breast mass. Referred by [name], MD for right breast masses  This is a 74 year old female who presents after recent screening mammogram that showed a 2.6 cm area of calcifications in the right breast. Further evaluation revealed two areas in the right upper outer quadrant that were questionable. Both were biopsied. The more posterior area showed a fibroadenoma. The more anterior mass was ADH with LCIS and associated fibroadenoma. The more posterior clip migrated 2.5 cm away from the biopsy clip.  Menarche 32 First pregnancy - 22 Breastfeed - yes Menopause - mid-50's Hormones - yes  CLINICAL DATA: Screening.  EXAM: DIGITAL SCREENING BILATERAL MAMMOGRAM WITH TOMO AND CAD  COMPARISON: Previous exam(s).  ACR Breast Density Category b: There are scattered areas of fibroglandular density.  FINDINGS: In the right breast, calcifications warrant further evaluation with magnified views. In the left breast, no findings suspicious for malignancy. Images were processed with CAD.  IMPRESSION: Further evaluation is suggested for calcifications in the right breast.  RECOMMENDATION: Diagnostic mammogram of the right breast. (Code:[id number])  The patient will be contacted regarding the findings, and additional imaging will be scheduled.  BI-RADS CATEGORY 0: Incomplete. Need additional imaging evaluation and/or prior mammograms for comparison.   Electronically Signed By: Lillia Mountain M.D. On: 01/03/2020 07:53  CLINICAL DATA: Calcifications in the posterior aspect of the upper-outer right breast on a recent screening mammogram.  EXAM: DIGITAL DIAGNOSTIC RIGHT MAMMOGRAM WITH CAD  COMPARISON: Previous exam(s).  ACR Breast Density Category b: There are scattered areas of fibroglandular density.  FINDINGS: 2D true lateral and spot magnification views of the right  breast demonstrate a 2.6 x 2.0 x 1.6 cm group of heterogeneous calcifications in the posterior aspect of the upper-outer right breast. These vary in size and shape and some are arranged in a linear fashion in the true lateral projection.  Mammographic images were processed with CAD.  IMPRESSION: 2.6 cm group of calcifications suspicious for malignancy in the upper-outer quadrant of the right breast.  RECOMMENDATION: Stereotactic guided core needle biopsy of the 2.6 cm group of calcifications in the upper outer quadrant of the right breast. This has been discussed with the patient and scheduled at 11:30 a.m. on 01/27/2020.  I have discussed the findings and recommendations with the patient. If applicable, a reminder letter will be sent to the patient regarding the next appointment.  BI-RADS CATEGORY 4: Suspicious.   Electronically Signed By: Claudie Revering M.D. On: 01/20/2020 17:30  CLINICAL DATA: 74 year old female presenting for biopsy of two groups of right breast calcifications.  EXAM: RIGHT BREAST STEREOTACTIC CORE NEEDLE BIOPSY x2  COMPARISON: Previous exams.  FINDINGS: The patient and I discussed the procedure of stereotactic-guided biopsy including benefits and alternatives. We discussed the high likelihood of a successful procedure. We discussed the risks of the procedure including infection, bleeding, tissue injury, clip migration, and inadequate sampling. Informed written consent was given. The usual time out protocol was performed immediately prior to the procedure.  1. Using sterile technique and 1% Lidocaine as local anesthetic, under stereotactic guidance, a 9 gauge vacuum assisted device was used to perform core needle biopsy of calcifications in the upper outer right breast, posterior using a superior approach. Specimen radiograph was performed showing at least 2 specimens with calcifications. Specimens with calcifications are  identified for pathology.  Lesion quadrant: Upper outer quadrant  At the conclusion of the procedure, a coil tissue marker  clip was deployed into the biopsy cavity. Follow-up 2-view mammogram was performed and dictated separately.  2. Using sterile technique and 1% Lidocaine as local anesthetic, under stereotactic guidance, a 9 gauge vacuum assisted device was used to perform core needle biopsy of calcifications in the upper outer right breast, anterior using a superior approach. Specimen radiograph was performed showing at least 5 specimens with calcifications. Specimens with calcifications are identified for pathology.  Lesion quadrant: Upper outer quadrant  At the conclusion of the procedure, an X tissue marker clip was deployed into the biopsy cavity. Follow-up 2-view mammogram was performed and dictated separately.  IMPRESSION: Stereotactic-guided biopsy of two groups of calcifications in the upper outer right breast. No apparent complications.  Electronically Signed: By: Audie Pinto M.D. On: 01/27/2020 12:43    Problem List/Past Medical  FIBROADENOMA, RIGHT (D24.1)  ATYPICAL DUCTAL HYPERPLASIA OF RIGHT BREAST (N60.91)  BREAST NEOPLASM, TIS (LCIS), RIGHT (D05.01)   Past Surgical History  Tonsillectomy   Diagnostic Studies History Mammogram  within last year Pap Smear  1-5 years ago  Allergies  Erythromycin ([facility]) *DERMATOLOGICALS*  Sulfamethoxazole *SULFONAMIDES*  Allergies Reconciled   Medication History  Albuterol Sulfate ((2.5 MG/3ML)0.083% Nebulized Soln, Inhalation) Active. Allopurinol (100MG  Tablet, Oral) Active. Anoro Ellipta (62.5-25MCG/INH Aero Pow Br Act, Inhalation) Active. Atorvastatin Calcium (40MG  Tablet, Oral) Active. Clopidogrel Bisulfate (75MG  Tablet, Oral) Active. Ezetimibe (10MG  Tablet, Oral) Active. Fluticasone Propionate (50MCG/ACT Suspension, Nasal) Active. Furosemide (40MG  Tablet, Oral)  Active. Klor-Con M20 [facility] Tablet ER, Oral) Active. Losartan Potassium (100MG  Tablet, Oral) Active. Metoprolol Succinate ER (100MG  Tablet ER 24HR, Oral) Active. Pantoprazole Sodium (20MG  Tablet DR, Oral) Active. Nitrostat (Sublingual) Specific strength unknown - Active. Medications Reconciled  Social History ( Alcohol use  Remotely quit alcohol use. Caffeine use  Coffee. Tobacco use  Former smoker.  Family History  Colon Cancer  Mother. Depression  Mother. Diabetes Mellitus  Daughter. Migraine Headache  Daughter. Respiratory Condition  Daughter.  Pregnancy / Birth History  Age at menarche  60 years. Age of menopause  7-60 Contraceptive History  Oral contraceptives. Gravida  2 Length (months) of breastfeeding  3-6 Maternal age  14-20 Para  2  Other Problems Anxiety Disorder  Arthritis  Chronic Obstructive Lung Disease  Diverticulosis  Gastroesophageal Reflux Disease  Hemorrhoids  High blood pressure  Hypercholesterolemia  Myocardial infarction     Review of Systems  General Not Present- Appetite Loss, Chills, Fatigue, Fever, Night Sweats, Weight Gain and Weight Loss. HEENT Present- Seasonal Allergies and Wears glasses/contact lenses. Not Present- Earache, Hearing Loss, Hoarseness, Nose Bleed, Oral Ulcers, Ringing in the Ears, Sinus Pain, Sore Throat, Visual Disturbances and Yellow Eyes. Respiratory Present- Wheezing. Not Present- Bloody sputum, Chronic Cough, Difficulty Breathing and Snoring. Cardiovascular Present- Leg Cramps, Shortness of Breath and Swelling of Extremities. Not Present- Chest Pain, Difficulty Breathing Lying Down, Palpitations and Rapid Heart Rate. Gastrointestinal Present- Bloating, Excessive gas, Hemorrhoids and Indigestion. Not Present- Abdominal Pain, Bloody Stool, Change in Bowel Habits, Chronic diarrhea, Constipation, Difficulty Swallowing, Gets full quickly at meals, Nausea, Rectal Pain and  Vomiting. Musculoskeletal Present- Joint Pain, Joint Stiffness, Muscle Pain, Muscle Weakness and Swelling of Extremities. Not Present- Back Pain. Neurological Present- Headaches and Trouble walking. Not Present- Decreased Memory, Fainting, Numbness, Seizures, Tingling, Tremor and Weakness. Psychiatric Present- Anxiety and Change in Sleep Pattern. Not Present- Bipolar, Depression, Fearful and Frequent crying. Endocrine Not Present- Cold Intolerance, Excessive Hunger, Hair Changes, Heat Intolerance, Hot flashes and New Diabetes. Hematology Present- Blood Thinners, Easy Bruising and Excessive bleeding. Not  Present- Gland problems, HIV and Persistent Infections.  Vitals Weight: 232.25 lb Height: 65in Body Surface Area: 2.11 m Body Mass Index: 38.65 kg/m  Temp.: 97.30F  Pulse: 131 (Regular)  BP: 132/82(Sitting, Left Arm, Standard)       Physical Exam  The physical exam findings are as follows: Note: Constitutional: WDWN in NAD, conversant, no obvious deformities Eyes: Pupils equal, round; sclera anicteric; moist conjunctiva; no lid lag HENT: Oral mucosa moist; good dentition Neck: No masses palpated, trachea midline; no thyromegaly Lungs: CTA bilaterally; normal respiratory effort Breasts: symmetric, no nipple changes, no axillary lymphadenopathy; no dominant masses CV: Regular rate and rhythm; no murmurs; extremities well-perfused with no edema Abd: +bowel sounds, soft, non-tender, no palpable organomegaly; no palpable hernias Musc: Normal gait; no apparent clubbing or cyanosis in extremities Lymphatic: No palpable cervical or axillary lymphadenopathy Skin: Warm, dry; no sign of jaundice Psychiatric - alert and oriented x 4; calm mood and affect    Assessment & Plan  FIBROADENOMA, RIGHT (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 The surgical procedure has been discussed with the patient. Potential risks, benefits, alternative treatments, and expected outcomes have been explained. All of the patient's questions at this time have been answered. The likelihood of reaching the patient's treatment goal is good. The patient understand the proposed surgical procedure and wishes to proceed.    Imogene Burn. Georgette Dover, MD, [facility] Surgery  General/ Trauma Surgery   04/28/2020 7:16 AM

## 2020-04-28 NOTE — Anesthesia Postprocedure Evaluation (Signed)
Anesthesia Post Note  Patient: Tracy [name]  Procedure(s) Performed: RIGHT BREAST LUMPECTOMY X 2  WITH RADIOACTIVE SEED LOCALIZATION (Right Breast)     Patient location during evaluation: PACU Anesthesia Type: General Level of consciousness: awake and alert and oriented Pain management: pain level controlled Vital Signs Assessment: post-procedure vital signs reviewed and stable Respiratory status: spontaneous breathing, nonlabored ventilation and respiratory function stable Cardiovascular status: blood pressure returned to baseline Postop Assessment: no apparent nausea or vomiting Anesthetic complications: no   No complications documented.  Last Vitals:  Vitals:   04/28/20 0900 04/28/20 0904  BP: 140/80   Pulse: 74 74  Resp: 12 12  Temp:    SpO2: 97% 97%    Last Pain:  Vitals:   04/28/20 0900  TempSrc:   PainSc: [place]

## 2020-04-28 NOTE — Anesthesia Procedure Notes (Signed)
Procedure Name: LMA Insertion Date/Time: 04/28/2020 7:37 AM Performed by: Imagene Riches, CRNA Pre-anesthesia Checklist: Patient identified, Emergency Drugs available, Suction available and Patient being monitored Patient Re-evaluated:Patient Re-evaluated prior to induction Oxygen Delivery Method: Circle System Utilized Preoxygenation: Pre-oxygenation with 100% oxygen Induction Type: IV induction Ventilation: Mask ventilation without difficulty LMA: LMA inserted LMA Size: 4.0 Number of attempts: 1 Airway Equipment and Method: Bite block Placement Confirmation: positive ETCO2 Tube secured with: Tape Dental Injury: Teeth and Oropharynx as per pre-operative assessment

## 2020-04-29 ENCOUNTER — Encounter (HOSPITAL_BASED_OUTPATIENT_CLINIC_OR_DEPARTMENT_OTHER): Payer: Self-pay | Admitting: Surgery

## 2020-04-30 LAB — SURGICAL PATHOLOGY

## 2020-05-12 ENCOUNTER — Ambulatory Visit: Payer: Medicare Other | Admitting: Physician Assistant

## 2020-05-18 ENCOUNTER — Ambulatory Visit: Payer: Medicare Other | Admitting: Physician Assistant

## 2020-05-28 ENCOUNTER — Other Ambulatory Visit: Payer: Self-pay | Admitting: Family Medicine

## 2020-06-03 ENCOUNTER — Ambulatory Visit: Payer: Medicare Other | Admitting: Physician Assistant

## 2020-06-08 ENCOUNTER — Ambulatory Visit: Payer: Medicare Other | Admitting: Emergency Medicine

## 2020-06-12 ENCOUNTER — Telehealth: Payer: Self-pay | Admitting: Family Medicine

## 2020-06-12 NOTE — Progress Notes (Signed)
  Chronic Care Management   Outreach Note  06/12/2020 Name: Tracy [name] MRN: [id number] DOB: Dec 15, 1945  Referred by: [name], [name], MD Reason for referral : No chief complaint on file.   An unsuccessful telephone outreach was attempted today. The patient was referred to the pharmacist for assistance with care management and care coordination.   Follow Up Plan:   Carley Perdue [employer] Scheduler

## 2020-06-15 NOTE — Progress Notes (Signed)
Cardiology Office Note:    Date:  06/16/2020   ID:  CLARENE [name], DOB December 04, 1945, MRN [id number]  PCP:  [name], Tracy G, MD  [facility] Cardiologist:  Sherren Mocha, MD   [facility] Electrophysiologist:  None   Referring MD: [name], Tracy G, MD   Chief Complaint:  Follow-up (CAD, CHF)    Patient Profile:    Tracy [name] is a 74 y.o. female with:   Coronary artery disease  ? S/p BMS to LCx in 2007 ? S/p DES to LCx in 10/2017 ? Myoview 07/2019: ant ischemia; high risk ? Cath 07/2019: patent LCx stent, mod diff dz in LAD and mild dz in RCA - med Rx   Chronic diastolic CHF ? Echocardiogram 12/18: EF 50-55, Gr 1 DD  Chronic kidney disease 3  COPD  Hypertension   Hyperlipidemia   Prior CV studies:   Cardiac catheterization 07/24/2019 LM ok LAD mod tortuous; ost 30, prox 50, mid 60, dist 40 LCx ost 25, prox stent patent w 10 ISR RCA prox 25   Myoview 07/18/2019 EF 32, lat infarct, +anterior ischemia; high risk  Echocardiogram 07/17/2019 EF 55-60, Gr 1 DD, normal RVSF, mild MR, trivial TR  Cardiac catheterization 11/06/2017 Conclusions: 1. Mild to moderate coronary artery disease involving LAD and RCA, not significantly change since 2011/20112. 2. 50-60% in-stent restenosis in proximal portion of mid LCx stent, corresponding to area of ischemia on recent myocardial perfusion stress test. 3. Mildly elevated left ventricular filling pressure.. 4. Successful PCI to proximal/mid LCx with placement of a Synergy 3.0 x 20 mm DES with 0% residual stenosis and TIMI-3 flow.  Recommendations: 1. Dual antiplatelet therapy with aspirin and clopidogrel for at least 6 months. 2. Aggressive secondary prevention. 3. Anticipate same-day discharge if patient remains hemodynamically stable and asymptomatic.    Myoview 10/13/17 EF 51, There is a medium size, moderate severity reversible defect in the basal and mid inferior and inferolateral walls consistent  with ischemia; Intermediate Risk   Echocardiogram 09/29/17 Mild LVH, EF 50-55, no RWMA, Gr 1 DD  Echocardiogram 10/04/16 EF 45-50  History of Present Illness:    Ms. Tracy [name] was last seen in clinic by Dr. Burt Knack in 11/2019.  She returns for follow up.  She is here alone.  Since last seen, she continues to have occasional left-sided chest discomfort.  This can occur with exertion or at rest.  It has not really gotten any worse.  She does note shortness of breath with some activities.  She does note orthopnea at times as well as increasing lower extremity swelling.  Her weight has gone up since her last visit.  She has not had syncope.      Past Medical History:  Diagnosis Date  . Allergic rhinitis   . CAD (coronary artery disease)    1/19 PCI/DES to [place] for ISR, normal EF.   Marland Kitchen CHF (congestive heart failure) ([place])    Echo 06/2019: EF 55-60, elevated LVEDP, normal RV SF, mild MAC, mild MR, trivial TR  . COPD (chronic obstructive pulmonary disease) ([place])   . GERD (gastroesophageal reflux disease)   . Gout   . Headache(784.0)   . Hyperlipidemia   . Hypertension   . Low back pain   . Menopausal syndrome   . Myocardial infarct ([place]) 2007   hx of  . Overactive bladder     Current Medications: Current Meds  Medication Sig  . acetaminophen (TYLENOL) 500 MG tablet Take 1,000 mg by mouth every 6 (  six) hours as needed for headache.  . albuterol (PROVENTIL) (2.5 MG/3ML) 0.083% nebulizer solution Take 3 mLs (2.5 mg total) by nebulization every 6 (six) hours as needed for wheezing or shortness of breath.  . allopurinol (ZYLOPRIM) 100 MG tablet Take 1 tablet (100 mg total) by mouth daily.  Marland Kitchen ALPRAZolam (XANAX) 0.25 MG tablet Take 1 tablet (0.25 mg total) by mouth daily as needed for anxiety. TAKE 1 TABLET TWICE A DAY AS NEEDED FOR ANXIETY  . ANORO ELLIPTA 62.5-25 MCG/INH AEPB TAKE 1 PUFF BY MOUTH EVERY DAY  . Apple Cider Vinegar 600 MG CAPS Take 1,200 mg by mouth.  Marland Kitchen aspirin EC 81 MG tablet  Take 81 mg by mouth daily.  Marland Kitchen atorvastatin (LIPITOR) 40 MG tablet Take 1 tablet (40 mg total) by mouth daily.  . calcium carbonate (CALCIUM 600) 600 MG TABS tablet Take 600 mg by mouth daily with breakfast.   . cetirizine (ZYRTEC) 10 MG tablet Take 10 mg by mouth daily.  . Cholecalciferol (VITAMIN D3 PO) Take 4,000 Units by mouth daily.   . clopidogrel (PLAVIX) 75 MG tablet TAKE 1 TABLET (75 MG TOTAL) BY MOUTH DAILY WITH BREAKFAST.  Marland Kitchen Cyanocobalamin (VITAMIN B-12) 5000 MCG SUBL Place 5,000 mcg under the tongue daily.   Marland Kitchen ezetimibe (ZETIA) 10 MG tablet Take 1 tablet (10 mg total) by mouth daily.  . fluticasone (FLONASE) 50 MCG/ACT nasal spray SPRAY 2 SPRAYS INTO EACH NOSTRIL EVERY DAY  . HYDROcodone-acetaminophen (NORCO/VICODIN) 5-325 MG tablet Take 1 tablet by mouth daily as needed for moderate pain.  Marland Kitchen losartan (COZAAR) 100 MG tablet TAKE 1 TABLET BY MOUTH EVERY DAY  . metoprolol succinate (TOPROL-XL) 100 MG 24 hr tablet Take 1 tablet (100 mg total) by mouth daily. Take with or immediately following a meal.  . Misc Natural Products (TART CHERRY ADVANCED) CAPS Take 1,000 capsules by mouth daily.   . nitroGLYCERIN (NITROSTAT) 0.4 MG SL tablet PLACE 1 TABLET (0.4 MG TOTAL) UNDER THE TONGUE EVERY 5 (FIVE) MINUTES AS NEEDED FOR CHEST PAIN.  Marland Kitchen pantoprazole (PROTONIX) 20 MG tablet TAKE 1 TABLET BY MOUTH EVERY DAY  . potassium chloride SA (KLOR-CON M20) 20 MEQ tablet Take 1 tablet (20 mEq total) by mouth 2 (two) times daily.  . VENTOLIN HFA 108 (90 Base) MCG/ACT inhaler INHALE 2 PUFFS INTO THE LUNGS EVERY 4 (FOUR) HOURS AS NEEDED FOR WHEEZING OR SHORTNESS OF BREATH.  . [DISCONTINUED] furosemide (LASIX) 40 MG tablet Take 40 mg by mouth daily.     Allergies:   Erythromycin and Sulfamethoxazole   Social History   Tobacco Use  . Smoking status: Former Smoker    Packs/day: 0.20    Years: 50.00    Pack years: 10.00    Quit date: 07/01/2012    Years since quitting: 7.9  . Smokeless tobacco: Never  Used  . Tobacco comment: completely quit May of 2018; period of years she did not smoke   Vaping Use  . Vaping Use: Never used  Substance Use Topics  . Alcohol use: Yes    Comment: seldom  . Drug use: No     Family Hx: The patient's family history includes Colon cancer (age of onset: 63) in her mother; Other in an other family member. There is no history of Stomach cancer. She was adopted.  ROS   EKGs/Labs/Other Test Reviewed:    EKG:  EKG is   ordered today.  The ekg ordered today demonstrates this rhythm, heart rate 89, normal axis, no ST-T  wave changes, QTC 438, similar to prior tracing  Recent Labs: 07/10/2019: Hemoglobin 12.9; Magnesium 1.9; NT-Pro BNP 435; Platelets 288 03/04/2020: ALT 40 04/24/2020: BUN 35; Creatinine, Ser 1.13; Potassium 4.9; Sodium 141   Recent Lipid Panel Lab Results  Component Value Date/Time   CHOL 128 03/04/2020 08:11 AM   TRIG 180 (H) 03/04/2020 08:11 AM   HDL 36 (L) 03/04/2020 08:11 AM   CHOLHDL 3.6 03/04/2020 08:11 AM   CHOLHDL 5.0 (H) 09/13/2016 07:38 AM   LDLCALC 62 03/04/2020 08:11 AM   LDLDIRECT 111.0 01/05/2015 10:19 AM    Physical Exam:    VS:  BP 122/80   Pulse (!) 59   Ht _0  (1.651 m)   Wt 232 lb (105.2 kg)   SpO2 93%   BMI 38.61 kg/m     Wt Readings from Last 3 Encounters:  06/16/20 232 lb (105.2 kg)  04/28/20 228 lb 13.4 oz (103.8 kg)  04/13/20 233 lb 6 oz (105.9 kg)     Constitutional:      Appearance: Healthy appearance. Not in distress.  Neck:     Thyroid: No thyromegaly.     Vascular: No JVR.  Pulmonary:     Effort: Pulmonary effort is normal.     Breath sounds: No wheezing. No rales.  Cardiovascular:     Normal rate. Regular rhythm. Normal S1. Normal S2.     Murmurs: There is no murmur.  Edema:    Pretibial: 1+ pitting edema of the left pretibial area and trace pitting edema of the right pretibial area.    Ankle: 1+ pitting edema of the left ankle and trace pitting edema of the right ankle. Abdominal:      Palpations: Abdomen is soft. There is no hepatomegaly.  Skin:    General: Skin is warm and dry.  Neurological:     Mental Status: Alert and oriented to person, place and time.     Cranial Nerves: Cranial nerves are intact.      ASSESSMENT & PLAN:    1. Coronary artery disease involving native coronary artery of native heart with angina pectoris ([place]) History of stenting to the LCx in 2017 and 2019.  Cardiac catheterization in October 2020 demonstrated patent LCx stent, moderate nonobstructive disease in the LAD and mild disease in the RCA.  Medical therapy has been continued.  She continues to have occasional left-sided chest discomfort.  This can occur at rest or with exertion.  I have suggested trying long-acting nitrates to see if this provides any relief.  -Continue aspirin, clopidogrel, atorvastatin, ezetimibe, metoprolol succinate.  -Start isosorbide 15 mg daily  -Follow-up 3 months  2. Chronic diastolic CHF (congestive heart failure) ([place]) 3. Shortness of breath She does note some shortness of breath with activities.  She has increased her weight from 221 to 232 since her last visit to our office.  She does have some increasing swelling in her lower extremities.  I have recommended that she increase her furosemide to twice daily dosing for 2 days and then resume once daily dosing.  Obtain BMET, BNP today.  If her BNP is significantly elevated, I will continue her on higher dose Lasix.  4. Stage 3a chronic kidney disease Recent creatinine stable.  5. Essential (primary) hypertension The patient's blood pressure is controlled on her current regimen.  Continue current therapy.   6. Mixed hyperlipidemia LDL optimal on most recent lab work.  Continue current Rx.     Dispo:  Return for Routine Follow Up,  w/ Dr. Burt Knack, or Richardson Dopp, PA-C, in person.   Medication Adjustments/Labs and Tests Ordered: Current medicines are reviewed at length with the patient today.  Concerns  regarding medicines are outlined above.  Tests Ordered: Orders Placed This Encounter  Procedures  . Basic metabolic panel  . Pro b natriuretic peptide (BNP)   Medication Changes: Meds ordered this encounter  Medications  . isosorbide mononitrate (IMDUR) 30 MG 24 hr tablet    Sig: Take 0.5 tablets (15 mg total) by mouth daily.    Dispense:  30 tablet    Refill:  6  . furosemide (LASIX) 40 MG tablet    Sig: Take 1 tablet by mouth twice a day for 2 days, then resume 1 tablet by mouth once a day    Dispense:  34 tablet    Refill:  6    Signed, Richardson Dopp, PA-C  06/16/2020 11:48 AM    [address], [place], [place]  [address] Phone: [phone]; Fax: [phone]

## 2020-06-16 ENCOUNTER — Ambulatory Visit (INDEPENDENT_AMBULATORY_CARE_PROVIDER_SITE_OTHER): Payer: Medicare Other | Admitting: Physician Assistant

## 2020-06-16 ENCOUNTER — Other Ambulatory Visit: Payer: Self-pay

## 2020-06-16 ENCOUNTER — Encounter: Payer: Self-pay | Admitting: Physician Assistant

## 2020-06-16 VITALS — BP 122/80 | HR 59 | Ht 65.0 in | Wt 232.0 lb

## 2020-06-16 DIAGNOSIS — R0602 Shortness of breath: Secondary | ICD-10-CM | POA: Diagnosis not present

## 2020-06-16 DIAGNOSIS — N1831 Chronic kidney disease, stage 3a: Secondary | ICD-10-CM | POA: Diagnosis not present

## 2020-06-16 DIAGNOSIS — E782 Mixed hyperlipidemia: Secondary | ICD-10-CM | POA: Diagnosis not present

## 2020-06-16 DIAGNOSIS — I25119 Atherosclerotic heart disease of native coronary artery with unspecified angina pectoris: Secondary | ICD-10-CM | POA: Diagnosis not present

## 2020-06-16 DIAGNOSIS — I10 Essential (primary) hypertension: Secondary | ICD-10-CM | POA: Diagnosis not present

## 2020-06-16 DIAGNOSIS — I5032 Chronic diastolic (congestive) heart failure: Secondary | ICD-10-CM | POA: Diagnosis not present

## 2020-06-16 MED ORDER — FUROSEMIDE 40 MG PO TABS
ORAL_TABLET | ORAL | 6 refills | Status: DC
Start: 2020-06-16 — End: 2020-08-10

## 2020-06-16 MED ORDER — ISOSORBIDE MONONITRATE ER 30 MG PO TB24
15.0000 mg | ORAL_TABLET | Freq: Every day | ORAL | 6 refills | Status: DC
Start: 2020-06-16 — End: 2020-09-22

## 2020-06-16 NOTE — Addendum Note (Signed)
Addended by: Briant Cedar on: 06/16/2020 04:31 PM   Modules accepted: Orders

## 2020-06-16 NOTE — Patient Instructions (Signed)
Medication Instructions:  Your physician has recommended you make the following change in your medication:   1) Take Furosemide 40 mg, 1 tablet by mouth twice a day for 2 days, then resume 1 tablet by mouth once a day 2) Start Isosorbide 30 mg, 0.5 tablet by mouth once a day  *If you need a refill on your cardiac medications before your next appointment, please call your pharmacy*  Lab Work: You will have labs drawn today: BMET/BNP  If you have labs (blood work) drawn today and your tests are completely normal, you will receive your results only by: Marland Kitchen MyChart Message (if you have MyChart) OR . A paper copy in the mail If you have any lab test that is abnormal or we need to change your treatment, we will call you to review the results.  Testing/Procedures: None ordered today  Follow-Up: On 09/22/20 at 10:45AM with Richardson Dopp, PA-C

## 2020-06-17 LAB — BASIC METABOLIC PANEL
BUN/Creatinine Ratio: 21 (ref 12–28)
BUN: 28 mg/dL — ABNORMAL HIGH (ref 8–27)
CO2: 22 mmol/L (ref 20–29)
Calcium: 9.4 mg/dL (ref 8.7–10.3)
Chloride: 105 mmol/L (ref 96–106)
Creatinine, Ser: 1.35 mg/dL — ABNORMAL HIGH (ref 0.57–1.00)
GFR calc Af Amer: 45 mL/min/{1.73_m2} — ABNORMAL LOW (ref >59)
GFR calc non Af Amer: 39 mL/min/{1.73_m2} — ABNORMAL LOW (ref >59)
Glucose: 116 mg/dL — ABNORMAL HIGH (ref 65–99)
Potassium: 4.1 mmol/L (ref 3.5–5.2)
Sodium: 140 mmol/L (ref 134–144)

## 2020-06-17 LAB — PRO B NATRIURETIC PEPTIDE: NT-Pro BNP: 349 pg/mL — ABNORMAL HIGH (ref 0–301)

## 2020-07-01 ENCOUNTER — Telehealth: Payer: Self-pay | Admitting: Family Medicine

## 2020-07-01 NOTE — Progress Notes (Signed)
°  Chronic Care Management   Outreach Note  07/01/2020 Name: Tracy [name] MRN: [id number] DOB: [birth year]  Referred by: [name], [name], MD Reason for referral : No chief complaint on file.   A second unsuccessful telephone outreach was attempted today. The patient was referred to pharmacist for assistance with care management and care coordination.  Follow Up Plan:   Carley Perdue [employer] Scheduler

## 2020-07-02 ENCOUNTER — Other Ambulatory Visit: Payer: Self-pay

## 2020-07-02 ENCOUNTER — Ambulatory Visit (INDEPENDENT_AMBULATORY_CARE_PROVIDER_SITE_OTHER): Payer: Medicare Other | Admitting: Emergency Medicine

## 2020-07-02 ENCOUNTER — Encounter: Payer: Self-pay | Admitting: Emergency Medicine

## 2020-07-02 VITALS — BP 118/68 | HR 85 | Temp 97.9°F | Ht 65.0 in | Wt 236.8 lb

## 2020-07-02 DIAGNOSIS — I25119 Atherosclerotic heart disease of native coronary artery with unspecified angina pectoris: Secondary | ICD-10-CM

## 2020-07-02 DIAGNOSIS — R0600 Dyspnea, unspecified: Secondary | ICD-10-CM

## 2020-07-02 DIAGNOSIS — Z23 Encounter for immunization: Secondary | ICD-10-CM

## 2020-07-02 DIAGNOSIS — I5042 Chronic combined systolic (congestive) and diastolic (congestive) heart failure: Secondary | ICD-10-CM | POA: Diagnosis not present

## 2020-07-02 DIAGNOSIS — J301 Allergic rhinitis due to pollen: Secondary | ICD-10-CM

## 2020-07-02 DIAGNOSIS — J449 Chronic obstructive pulmonary disease, unspecified: Secondary | ICD-10-CM | POA: Diagnosis not present

## 2020-07-02 DIAGNOSIS — R0609 Other forms of dyspnea: Secondary | ICD-10-CM

## 2020-07-02 NOTE — Progress Notes (Signed)
Subjective:    Patient ID: Tracy [name], female    DOB: 1945-12-20, 75 y.o.   MRN: [id number]  Shortness of Breath Associated symptoms include headaches. Pertinent negatives include no ear pain, fever, leg swelling, rash, rhinorrhea, sore throat, vomiting or wheezing.   ROV 10/28/19 --follow-up visit for 74 year old woman with hypertension, CAD, mixed obstructive and restrictive disease.  We have been managing COPD with Anoro.  Also with allergic rhinitis on Zyrtec, last time we added fluticasone nasal spray, seems to have helped her nasal congestion.  I treated her with a brief course of Lasix due to some increased volume, weight and crackles on exam at her last visit.  She reports that she still has some difficulty with chest fullness and SOB - she will increase her lasix from 40 to 80mg  as needed, seems to help her breathing. She remains on Anoro - not sure how much it helps her. She uses albuterol with heavier exertion, does help her, does not need it every day. No flares reported, although she did get steroid injections for her knees. Her flu shot is up to date, PNA shot is up to date.   ROV 07/02/20 --74 year old woman with hypertension and diastolic dysfunction, CAD restrictive and obstructive lung disease consistent with COPD, allergic rhinitis, GERD.  She was treated for acute flare in late January with doxycycline and prednisone taper. She reports today that since that flare she has been fairly stable. She still has exertional dyspnea with walking. Has to stop to rest. She remains on Anoro. She uses albuterol about 3x a week. Mucous was thicker recently, used some mucinex. She remains on flonase and zyrtec., she has been having trouble with fluid buildup, edema, abdominal fluid. She intermittently increases her lasix to bid sometimes - helps the edema and her breathing.     Review of Systems  Constitutional: Positive for unexpected weight change. Negative for fever.  HENT: Negative  for congestion, dental problem, ear pain, nosebleeds, postnasal drip, rhinorrhea, sinus pressure, sneezing, sore throat and trouble swallowing.   Eyes: Negative for redness and itching.  Respiratory: Positive for cough and shortness of breath. Negative for chest tightness and wheezing.   Cardiovascular: Negative for palpitations and leg swelling.  Gastrointestinal: Negative for nausea and vomiting.  Genitourinary: Negative for dysuria.  Musculoskeletal: Positive for joint swelling.  Skin: Negative for rash.  Neurological: Positive for headaches.  Hematological: Does not bruise/bleed easily.  Psychiatric/Behavioral: Negative for dysphoric mood. The patient is not nervous/anxious.        Objective:   Physical Exam Vitals:   07/02/20 1123 07/02/20 1124  BP: 118/68   Pulse: (!) 106 85  Temp: 97.9 F (36.6 C)   TempSrc: Temporal   SpO2: (!) 89% 93%  Weight: 236 lb 12.8 oz (107.4 kg)   Height: 5\' 5"  (1.651 m)    Gen: Pleasant, overwt, in no distress,  normal affect, no cough  ENT: No lesions,  mouth clear,  oropharynx clear, no postnasal drip  Neck: No JVD, no stridor  Lungs: No use of accessory muscles, decreased at bases, no crackles or wheezes.   Cardiovascular: RRR, heart sounds normal, no murmur, 2+ LE peripheral edema, more on the L  Musculoskeletal: No deformities, no cyanosis or clubbing  Neuro: alert, non focal  Skin: Warm, no lesions or rash     Assessment & Plan:  COPD (chronic obstructive pulmonary disease) ([place]) Please continue Anoro once daily. Keep your albuterol available use 2 puffs when you need it  for shortness of breath, chest tightness, wheezing. Okay to use your Mucinex as needed to help with mucus clearance COVID-19 vaccine is up-to-date.  Get the high-dose flu shot this fall. Follow with Dr Lamonte Sakai in 6 months or sooner if you have any problems  Allergic rhinitis Continue flonase and zyrtec  Chronic combined systolic and diastolic heart failure  [facility]) Continue your cardiac medications and your Lasix as directed by Dr. [name] and by cardiology.   Dyspnea on exertion Walking oximetry today on room air to ensure no occult desaturation  Baltazar Apo, MD, PhD 07/02/2020, 5:38 PM [facility] [phone] or if no answer [phone]

## 2020-07-02 NOTE — Assessment & Plan Note (Signed)
Walking oximetry today on room air to ensure no occult desaturation

## 2020-07-02 NOTE — Patient Instructions (Addendum)
Please continue Anoro once daily. Keep your albuterol available use 2 puffs when you need it for shortness of breath, chest tightness, wheezing. Okay to use your Mucinex as needed to help with mucus clearance Continue your Flonase and your Zyrtec as you have been taking them. Continue your cardiac medications and your Lasix as directed by Dr. [name] and by cardiology. Walking oximetry today on room air COVID-19 vaccine is up-to-date.  Get the high-dose flu shot this fall. Follow with Dr Lamonte Sakai in 6 months or sooner if you have any problems

## 2020-07-02 NOTE — Assessment & Plan Note (Signed)
Please continue Anoro once daily. Keep your albuterol available use 2 puffs when you need it for shortness of breath, chest tightness, wheezing. Okay to use your Mucinex as needed to help with mucus clearance COVID-19 vaccine is up-to-date.  Get the high-dose flu shot this fall. Follow with Dr Lamonte Sakai in 6 months or sooner if you have any problems

## 2020-07-02 NOTE — Assessment & Plan Note (Signed)
Continue your cardiac medications and your Lasix as directed by Dr. [name] and by cardiology.

## 2020-07-02 NOTE — Assessment & Plan Note (Signed)
Continue flonase and zyrtec

## 2020-07-02 NOTE — Assessment & Plan Note (Signed)
>>  ASSESSMENT AND PLAN FOR CHRONIC COMBINED SYSTOLIC AND DIASTOLIC HEART FAILURE (Tracy [name]) WRITTEN ON 07/02/2020  5:38 PM BY Collene Gobble, MD  Continue your cardiac medications and your Lasix as directed by Dr. [name] and by cardiology.

## 2020-07-03 ENCOUNTER — Other Ambulatory Visit: Payer: Self-pay | Admitting: Family Medicine

## 2020-07-03 DIAGNOSIS — K219 Gastro-esophageal reflux disease without esophagitis: Secondary | ICD-10-CM

## 2020-07-15 ENCOUNTER — Other Ambulatory Visit: Payer: Self-pay | Admitting: Physician Assistant

## 2020-07-15 MED ORDER — POTASSIUM CHLORIDE CRYS ER 20 MEQ PO TBCR: 20.0000 meq | EXTENDED_RELEASE_TABLET | Freq: Two times a day (BID) | ORAL | 3 refills | Status: DC

## 2020-07-17 ENCOUNTER — Telehealth: Payer: Self-pay | Admitting: Family Medicine

## 2020-07-17 NOTE — Progress Notes (Signed)
  Chronic Care Management   Outreach Note  07/17/2020 Name: Tracy [name] MRN: [id number] DOB: 01/01/1946  Referred by: [name], [name], MD Reason for referral : No chief complaint on file.   Third unsuccessful telephone outreach was attempted today. The patient was referred to the pharmacist for assistance with care management and care coordination.   Follow Up Plan:   Carley Perdue [employer] Scheduler

## 2020-07-25 DIAGNOSIS — Z23 Encounter for immunization: Secondary | ICD-10-CM | POA: Diagnosis not present

## 2020-07-31 ENCOUNTER — Telehealth: Payer: Self-pay | Admitting: Family Medicine

## 2020-07-31 NOTE — Telephone Encounter (Signed)
Left message for patient to call back and schedule Medicare Annual Wellness Visit (AWV) either virtually or in office.  Last AWV 05/24/18; please schedule at anytime with [facility] Nurse Health Advisor 2.  This should be a 45 minute visit.

## 2020-08-10 ENCOUNTER — Other Ambulatory Visit: Payer: Self-pay | Admitting: Physician Assistant

## 2020-08-19 ENCOUNTER — Other Ambulatory Visit: Payer: Self-pay | Admitting: Physician Assistant

## 2020-09-16 ENCOUNTER — Other Ambulatory Visit: Payer: Self-pay

## 2020-09-16 ENCOUNTER — Other Ambulatory Visit: Payer: Medicare Other

## 2020-09-16 DIAGNOSIS — E782 Mixed hyperlipidemia: Secondary | ICD-10-CM | POA: Diagnosis not present

## 2020-09-16 LAB — HEPATIC FUNCTION PANEL
ALT: 69 IU/L — ABNORMAL HIGH (ref 0–32)
AST: 88 IU/L — ABNORMAL HIGH (ref 0–40)
Albumin: 3.9 g/dL (ref 3.7–4.7)
Alkaline Phosphatase: 109 IU/L (ref 44–121)
Bilirubin Total: 0.4 mg/dL (ref 0.0–1.2)
Bilirubin, Direct: 0.15 mg/dL (ref 0.00–0.40)
Total Protein: 7.9 g/dL (ref 6.0–8.5)

## 2020-09-17 ENCOUNTER — Other Ambulatory Visit: Payer: Self-pay | Admitting: Physician Assistant

## 2020-09-17 ENCOUNTER — Other Ambulatory Visit: Payer: Self-pay | Admitting: Cardiovascular Disease

## 2020-09-21 NOTE — Progress Notes (Signed)
Cardiology Office Note:    Date:  09/22/2020   ID:  Tracy [name], DOB 19-Mar-1946, MRN [id number]  PCP:  [name], Tracy G, MD  [facility] Cardiologist:  Sherren Mocha, MD   [facility] Electrophysiologist:  None   Referring MD: [name], Tracy G, MD   Chief Complaint:  Follow-up (CAD)    Patient Profile:    Tracy [name] is a 74 y.o. female with:   Coronary artery disease  ? S/p BMS to LCx in 2007 ? S/p DES to LCx in 10/2017 ? Myoview 07/2019: ant ischemia; high risk ? Cath 07/2019: patent LCx stent, mod diff dz in LAD and mild dz in RCA - med Rx   Heart failure with preserved ejection fraction  ? Echocardiogram 12/18: EF 50-55, Gr 1 DD  Chronic kidney disease 3  COPD  Hypertension   Hyperlipidemia  Prior CV studies:   Cardiac catheterization 07/24/2019 LM ok LAD mod tortuous; ost 30, prox 50, mid 60, dist 40 LCx ost 25, prox stent patent w 10 ISR RCA prox 25   Myoview 07/18/2019 EF 32, lat infarct, +anterior ischemia; high risk  Echocardiogram 07/17/2019 EF 55-60, Gr 1 DD, normal RVSF, mild MR, trivial TR  Cardiac catheterization 11/06/2017 Conclusions: 1. Mild to moderate coronary artery disease involving LAD and RCA, not significantly change since 2011/20112. 2. 50-60% in-stent restenosis in proximal portion of mid LCx stent, corresponding to area of ischemia on recent myocardial perfusion stress test. 3. Mildly elevated left ventricular filling pressure.. 4. Successful PCI to proximal/mid LCx with placement of a Synergy 3.0 x 20 mm DES with 0% residual stenosis and TIMI-3 flow.  Recommendations: 1. Dual antiplatelet therapy with aspirin and clopidogrel for at least 6 months. 2. Aggressive secondary prevention. 3. Anticipate same-day discharge if patient remains hemodynamically stable and asymptomatic.    Myoview 10/13/17 EF 51,There is a medium size, moderate severity reversible defect in the basal and mid inferior and  inferolateral walls consistent with ischemia; Intermediate Risk   Echocardiogram 09/29/17 Mild LVH, EF 50-55, no RWMA, Gr 1 DD  Echocardiogram 10/04/16 EF 45-50  History of Present Illness:    Tracy [name] was last seen in 05/2020.  She continued to have occasional anginal symptoms and I placed her on Isosorbide.  Her furosemide was also adjusted due to volume excess.  She is seen for f/u.   Since last seen, she has noted more frequent chest pains.  On Thanksgiving day she had to take nitroglycerin for sharp left-sided chest pain.  She had to take this again last week for similar symptoms.  She has been having symptoms that remind her of what she had prior to her heart attack in the past.  She has had a stiff neck recently.  She is also noted worsening shortness of breath with exertion.  She has associated chest pressure.  She has been nauseated at times but has not had diaphoresis.  She sleeps on 2 pillows chronically.  She does have occasional PND.  This is not a new symptom for her.  She has lower extremity swelling.  Her left leg is larger than her right.  This is unchanged.      Past Medical History:  Diagnosis Date  . Allergic rhinitis   . CAD (coronary artery disease)    1/19 PCI/DES to [place] for ISR, normal EF.   Marland Kitchen CHF (congestive heart failure) ([place])    Echo 06/2019: EF 55-60, elevated LVEDP, normal RV SF, mild MAC, mild MR, trivial  TR  . COPD (chronic obstructive pulmonary disease) ([place])   . GERD (gastroesophageal reflux disease)   . Gout   . Headache(784.0)   . Hyperlipidemia   . Hypertension   . Low back pain   . Menopausal syndrome   . Myocardial infarct ([place]) 2007   hx of  . Overactive bladder     Current Medications: Current Meds  Medication Sig  . acetaminophen (TYLENOL) 500 MG tablet Take 1,000 mg by mouth every 6 (six) hours as needed for headache.  . albuterol (PROVENTIL) (2.5 MG/3ML) 0.083% nebulizer solution Take 3 mLs (2.5 mg total) by nebulization every 6  (six) hours as needed for wheezing or shortness of breath.  . allopurinol (ZYLOPRIM) 100 MG tablet Take 1 tablet (100 mg total) by mouth daily.  Marland Kitchen ALPRAZolam (XANAX) 0.25 MG tablet Take 1 tablet (0.25 mg total) by mouth daily as needed for anxiety. TAKE 1 TABLET TWICE A DAY AS NEEDED FOR ANXIETY  . ANORO ELLIPTA 62.5-25 MCG/INH AEPB TAKE 1 PUFF BY MOUTH EVERY DAY  . Apple Cider Vinegar 600 MG CAPS Take 1,200 mg by mouth.  Marland Kitchen aspirin EC 81 MG tablet Take 81 mg by mouth daily.  . calcium carbonate (CALCIUM 600) 600 MG TABS tablet Take 600 mg by mouth daily with breakfast.   . cetirizine (ZYRTEC) 10 MG tablet Take 10 mg by mouth daily.  . Cholecalciferol (VITAMIN D3 PO) Take 4,000 Units by mouth daily.   . clopidogrel (PLAVIX) 75 MG tablet TAKE 1 TABLET (75 MG TOTAL) BY MOUTH DAILY WITH BREAKFAST.  Marland Kitchen Cyanocobalamin (VITAMIN B-12) 5000 MCG SUBL Place 5,000 mcg under the tongue daily.   Marland Kitchen ezetimibe (ZETIA) 10 MG tablet Take 1 tablet (10 mg total) by mouth daily.  . fluticasone (FLONASE) 50 MCG/ACT nasal spray SPRAY 2 SPRAYS INTO EACH NOSTRIL EVERY DAY  . furosemide (LASIX) 40 MG tablet TAKE 1 TABLET BY MOUTH EVERY DAY  . HYDROcodone-acetaminophen (NORCO/VICODIN) 5-325 MG tablet Take 1 tablet by mouth daily as needed for moderate pain.  . isosorbide mononitrate (IMDUR) 30 MG 24 hr tablet Take 1 tablet (30 mg total) by mouth daily.  Marland Kitchen losartan (COZAAR) 100 MG tablet TAKE 1 TABLET BY MOUTH EVERY DAY  . metoprolol succinate (TOPROL-XL) 100 MG 24 hr tablet TAKE 1 TABLET (100 MG TOTAL) BY MOUTH DAILY. TAKE WITH OR IMMEDIATELY FOLLOWING A MEAL.  Marland Kitchen Misc Natural Products (TART CHERRY ADVANCED) CAPS Take 1,000 capsules by mouth daily.   . nitroGLYCERIN (NITROSTAT) 0.4 MG SL tablet Place 1 tablet (0.4 mg total) under the tongue every 5 (five) minutes as needed for chest pain.  . pantoprazole (PROTONIX) 20 MG tablet TAKE 1 TABLET BY MOUTH EVERY DAY  . potassium chloride SA (KLOR-CON M20) 20 MEQ tablet Take 1  tablet (20 mEq total) by mouth 2 (two) times daily.  . VENTOLIN HFA 108 (90 Base) MCG/ACT inhaler INHALE 2 PUFFS INTO THE LUNGS EVERY 4 (FOUR) HOURS AS NEEDED FOR WHEEZING OR SHORTNESS OF BREATH.  . [DISCONTINUED] isosorbide mononitrate (IMDUR) 30 MG 24 hr tablet Take 0.5 tablets (15 mg total) by mouth daily.  . [DISCONTINUED] nitroGLYCERIN (NITROSTAT) 0.4 MG SL tablet PLACE 1 TABLET (0.4 MG TOTAL) UNDER THE TONGUE EVERY 5 (FIVE) MINUTES AS NEEDED FOR CHEST PAIN.     Allergies:   Erythromycin and Sulfamethoxazole   Social History   Tobacco Use  . Smoking status: Former Smoker    Packs/day: 0.20    Years: 50.00    Pack years: 10.00  Quit date: 07/01/2012    Years since quitting: 8.2  . Smokeless tobacco: Never Used  . Tobacco comment: completely quit May of 2018; period of years she did not smoke   Vaping Use  . Vaping Use: Never used  Substance Use Topics  . Alcohol use: Yes    Comment: seldom  . Drug use: No     Family Hx: The patient's family history includes Colon cancer (age of onset: 59) in her mother; Other in an other family member. There is no history of Stomach cancer. She was adopted.  Review of Systems  Constitutional: Negative for fever.  Respiratory: Negative for cough.   Gastrointestinal: Negative for hematochezia and melena.  Genitourinary: Negative for hematuria.     EKGs/Labs/Other Test Reviewed:    EKG:  EKG is   ordered today.  The ekg ordered today demonstrates normal sinus rhythm, HR 85, normal axis, QTc 456, no ST-TW changes.   Recent Labs: 06/16/2020: BUN 28; Creatinine, Ser 1.35; NT-Pro BNP 349; Potassium 4.1; Sodium 140 09/16/2020: ALT 69   Recent Lipid Panel Lab Results  Component Value Date/Time   CHOL 128 03/04/2020 08:11 AM   TRIG 180 (H) 03/04/2020 08:11 AM   HDL 36 (L) 03/04/2020 08:11 AM   CHOLHDL 3.6 03/04/2020 08:11 AM   CHOLHDL 5.0 (H) 09/13/2016 07:38 AM   LDLCALC 62 03/04/2020 08:11 AM   LDLDIRECT 111.0 01/05/2015 10:19 AM       Risk Assessment/Calculations:      Physical Exam:    VS:  BP 134/76   Pulse 89   Ht _0  (1.651 m)   Wt 233 lb 6.4 oz (105.9 kg)   SpO2 95%   BMI 38.84 kg/m     Wt Readings from Last 3 Encounters:  09/22/20 233 lb 6.4 oz (105.9 kg)  07/02/20 236 lb 12.8 oz (107.4 kg)  06/16/20 232 lb (105.2 kg)     Constitutional:      Appearance: Healthy appearance. Not in distress.  Neck:     Vascular: No JVR.  Pulmonary:     Effort: Pulmonary effort is normal.     Breath sounds: No wheezing. No rales.  Cardiovascular:     Normal rate. Regular rhythm. Normal S1. Normal S2.     Murmurs: There is no murmur.  Edema:    Pretibial: 1+ edema of the left pretibial area.    Ankle: trace edema of the right ankle. Abdominal:     Palpations: Abdomen is soft.  Musculoskeletal:     Cervical back: Neck supple. Skin:    General: Skin is warm and dry.  Neurological:     Mental Status: Alert and oriented to person, place and time.     Cranial Nerves: Cranial nerves are intact.      ASSESSMENT & PLAN:    1. Coronary artery disease involving native coronary artery of native heart with angina pectoris [facility]) Prior history of stenting to the LCx in 2007 and again in 2019.  She had a high risk Myoview in October of last year.  Cardiac catheterization demonstrated patent stent in the LCx and moderate nonobstructive disease in the RCA and LAD.  She has been managed medically.  I placed her on isosorbide at last visit.  She did not have improvement in her symptoms.  She has actually had worsening symptoms.  She took nitroglycerin twice in the past couple of weeks.  This is the first time she has done this since her heart attack.  Electrocardiogram  today demonstrates no acute changes.  She is on 2 antianginals with CCS class III symptoms.  I have recommended proceeding with cardiac catheterization.  She is willing to proceed.  I reviewed this with Dr. Harrell Gave (attending MD), who agreed.  I will  try to arrange this with Dr. Burt Knack in the next week.  2. Chronic heart failure with preserved ejection fraction (HCC) Overall, volume appears stable.  Recent BNP was not significantly elevated.  As she does have chronic shortness of breath in addition to anginal symptoms, I will arrange a right and left heart catheterization.  Continue current dose of furosemide.  Obtain BMET today.  3. Stage 3a chronic kidney disease (HCC) Recent creatinine 1.35.  4. Essential (primary) hypertension Blood pressure somewhat borderline.  I will increase her isosorbide to 30 mg daily.  5. Mixed hyperlipidemia 6. Elevated LFTs Her atorvastatin was stopped yesterday due to elevated LFTs.  She has been referred back to PCP for further evaluation    Shared Decision Making/Informed Consent    The risks [stroke (1 in 1000), death (1 in 1000), kidney failure [usually temporary] (1 in 500), bleeding (1 in 200), allergic reaction [possibly serious] (1 in 200)], benefits (diagnostic support and management of coronary artery disease) and alternatives of a cardiac catheterization were discussed in detail with Ms. Ambrosius and she is willing to proceed.    Dispo:  Return for Post Procedure Follow Up, w/ Dr. Burt Knack, or Richardson Dopp, PA-C, in person.   Medication Adjustments/Labs and Tests Ordered: Current medicines are reviewed at length with the patient today.  Concerns regarding medicines are outlined above.  Tests Ordered: Orders Placed This Encounter  Procedures  . Basic metabolic panel  . CBC  . EKG 12-Lead   Medication Changes: Meds ordered this encounter  Medications  . nitroGLYCERIN (NITROSTAT) 0.4 MG SL tablet    Sig: Place 1 tablet (0.4 mg total) under the tongue every 5 (five) minutes as needed for chest pain.    Dispense:  25 tablet    Refill:  3  . isosorbide mononitrate (IMDUR) 30 MG 24 hr tablet    Sig: Take 1 tablet (30 mg total) by mouth daily.    Dispense:  30 tablet    Refill:  6     Signed, Richardson Dopp, PA-C  09/22/2020 4:40 PM    [facility] [address], [place], [place]  [address] Phone: [phone]; Fax: [phone]

## 2020-09-21 NOTE — H&P (View-Only) (Signed)
Cardiology Office Note:    Date:  09/22/2020   ID:  Tracy [name], DOB Mar 28, 1946, MRN [id number]  PCP:  [name], [name], MD  [facility] Cardiologist:  Sherren Mocha, MD   [facility] Electrophysiologist:  None   Referring MD: [name], [name], MD   Chief Complaint:  Follow-up (CAD)    Patient Profile:    Tracy [name] is a 74 y.o. female with:   Coronary artery disease  ? S/p BMS to LCx in 2007 ? S/p DES to LCx in 10/2017 ? Myoview 07/2019: ant ischemia; high risk ? Cath 07/2019: patent LCx stent, mod diff dz in LAD and mild dz in RCA - med Rx   Heart failure with preserved ejection fraction  ? Echocardiogram 12/18: EF 50-55, Gr 1 DD  Chronic kidney disease 3  COPD  Hypertension   Hyperlipidemia  Prior CV studies:   Cardiac catheterization 07/24/2019 LM ok LAD mod tortuous; ost 30, prox 50, mid 60, dist 40 LCx ost 25, prox stent patent w 10 ISR RCA prox 25   Myoview 07/18/2019 EF 32, lat infarct, +anterior ischemia; high risk  Echocardiogram 07/17/2019 EF 55-60, Gr 1 DD, normal RVSF, mild MR, trivial TR  Cardiac catheterization 11/06/2017 Conclusions: 1. Mild to moderate coronary artery disease involving LAD and RCA, not significantly change since 2011/20112. 2. 50-60% in-stent restenosis in proximal portion of mid LCx stent, corresponding to area of ischemia on recent myocardial perfusion stress test. 3. Mildly elevated left ventricular filling pressure.. 4. Successful PCI to proximal/mid LCx with placement of a Synergy 3.0 x 20 mm DES with 0% residual stenosis and TIMI-3 flow.  Recommendations: 1. Dual antiplatelet therapy with aspirin and clopidogrel for at least 6 months. 2. Aggressive secondary prevention. 3. Anticipate same-day discharge if patient remains hemodynamically stable and asymptomatic.    Myoview 10/13/17 EF 51,There is a medium size, moderate severity reversible defect in the basal and mid inferior and  inferolateral walls consistent with ischemia; Intermediate Risk   Echocardiogram 09/29/17 Mild LVH, EF 50-55, no RWMA, Gr 1 DD  Echocardiogram 10/04/16 EF 45-50  History of Present Illness:    Tracy [name] was last seen in 05/2020.  She continued to have occasional anginal symptoms and I placed her on Isosorbide.  Her furosemide was also adjusted due to volume excess.  She is seen for f/u.   Since last seen, she has noted more frequent chest pains.  On Thanksgiving day she had to take nitroglycerin for sharp left-sided chest pain.  She had to take this again last week for similar symptoms.  She has been having symptoms that remind her of what she had prior to her heart attack in the past.  She has had a stiff neck recently.  She is also noted worsening shortness of breath with exertion.  She has associated chest pressure.  She has been nauseated at times but has not had diaphoresis.  She sleeps on 2 pillows chronically.  She does have occasional PND.  This is not a new symptom for her.  She has lower extremity swelling.  Her left leg is larger than her right.  This is unchanged.      Past Medical History:  Diagnosis Date  . Allergic rhinitis   . CAD (coronary artery disease)    1/19 PCI/DES to [place] for ISR, normal EF.   Marland Kitchen CHF (congestive heart failure) ([place])    Echo 06/2019: EF 55-60, elevated LVEDP, normal RV SF, mild MAC, mild MR, trivial  TR  . COPD (chronic obstructive pulmonary disease) ([place])   . GERD (gastroesophageal reflux disease)   . Gout   . Headache(784.0)   . Hyperlipidemia   . Hypertension   . Low back pain   . Menopausal syndrome   . Myocardial infarct ([place]) 2007   hx of  . Overactive bladder     Current Medications: Current Meds  Medication Sig  . acetaminophen (TYLENOL) 500 MG tablet Take 1,000 mg by mouth every 6 (six) hours as needed for headache.  . albuterol (PROVENTIL) (2.5 MG/3ML) 0.083% nebulizer solution Take 3 mLs (2.5 mg total) by nebulization every 6  (six) hours as needed for wheezing or shortness of breath.  . allopurinol (ZYLOPRIM) 100 MG tablet Take 1 tablet (100 mg total) by mouth daily.  Marland Kitchen ALPRAZolam (XANAX) 0.25 MG tablet Take 1 tablet (0.25 mg total) by mouth daily as needed for anxiety. TAKE 1 TABLET TWICE A DAY AS NEEDED FOR ANXIETY  . ANORO ELLIPTA 62.5-25 MCG/INH AEPB TAKE 1 PUFF BY MOUTH EVERY DAY  . Apple Cider Vinegar 600 MG CAPS Take 1,200 mg by mouth.  Marland Kitchen aspirin EC 81 MG tablet Take 81 mg by mouth daily.  . calcium carbonate (CALCIUM 600) 600 MG TABS tablet Take 600 mg by mouth daily with breakfast.   . cetirizine (ZYRTEC) 10 MG tablet Take 10 mg by mouth daily.  . Cholecalciferol (VITAMIN D3 PO) Take 4,000 Units by mouth daily.   . clopidogrel (PLAVIX) 75 MG tablet TAKE 1 TABLET (75 MG TOTAL) BY MOUTH DAILY WITH BREAKFAST.  Marland Kitchen Cyanocobalamin (VITAMIN B-12) 5000 MCG SUBL Place 5,000 mcg under the tongue daily.   Marland Kitchen ezetimibe (ZETIA) 10 MG tablet Take 1 tablet (10 mg total) by mouth daily.  . fluticasone (FLONASE) 50 MCG/ACT nasal spray SPRAY 2 SPRAYS INTO EACH NOSTRIL EVERY DAY  . furosemide (LASIX) 40 MG tablet TAKE 1 TABLET BY MOUTH EVERY DAY  . HYDROcodone-acetaminophen (NORCO/VICODIN) 5-325 MG tablet Take 1 tablet by mouth daily as needed for moderate pain.  . isosorbide mononitrate (IMDUR) 30 MG 24 hr tablet Take 1 tablet (30 mg total) by mouth daily.  Marland Kitchen losartan (COZAAR) 100 MG tablet TAKE 1 TABLET BY MOUTH EVERY DAY  . metoprolol succinate (TOPROL-XL) 100 MG 24 hr tablet TAKE 1 TABLET (100 MG TOTAL) BY MOUTH DAILY. TAKE WITH OR IMMEDIATELY FOLLOWING A MEAL.  Marland Kitchen Misc Natural Products (TART CHERRY ADVANCED) CAPS Take 1,000 capsules by mouth daily.   . nitroGLYCERIN (NITROSTAT) 0.4 MG SL tablet Place 1 tablet (0.4 mg total) under the tongue every 5 (five) minutes as needed for chest pain.  . pantoprazole (PROTONIX) 20 MG tablet TAKE 1 TABLET BY MOUTH EVERY DAY  . potassium chloride SA (KLOR-CON M20) 20 MEQ tablet Take 1  tablet (20 mEq total) by mouth 2 (two) times daily.  . VENTOLIN HFA 108 (90 Base) MCG/ACT inhaler INHALE 2 PUFFS INTO THE LUNGS EVERY 4 (FOUR) HOURS AS NEEDED FOR WHEEZING OR SHORTNESS OF BREATH.  . [DISCONTINUED] isosorbide mononitrate (IMDUR) 30 MG 24 hr tablet Take 0.5 tablets (15 mg total) by mouth daily.  . [DISCONTINUED] nitroGLYCERIN (NITROSTAT) 0.4 MG SL tablet PLACE 1 TABLET (0.4 MG TOTAL) UNDER THE TONGUE EVERY 5 (FIVE) MINUTES AS NEEDED FOR CHEST PAIN.     Allergies:   Erythromycin and Sulfamethoxazole   Social History   Tobacco Use  . Smoking status: Former Smoker    Packs/day: 0.20    Years: 50.00    Pack years: 10.00  Quit date: 07/01/2012    Years since quitting: 8.2  . Smokeless tobacco: Never Used  . Tobacco comment: completely quit May of 2018; period of years she did not smoke   Vaping Use  . Vaping Use: Never used  Substance Use Topics  . Alcohol use: Yes    Comment: seldom  . Drug use: No     Family Hx: The patient's family history includes Colon cancer (age of onset: 44) in her mother; Other in an other family member. There is no history of Stomach cancer. She was adopted.  Review of Systems  Constitutional: Negative for fever.  Respiratory: Negative for cough.   Gastrointestinal: Negative for hematochezia and melena.  Genitourinary: Negative for hematuria.     EKGs/Labs/Other Test Reviewed:    EKG:  EKG is   ordered today.  The ekg ordered today demonstrates normal sinus rhythm, HR 85, normal axis, QTc 456, no ST-TW changes.   Recent Labs: 06/16/2020: BUN 28; Creatinine, Ser 1.35; NT-Pro BNP 349; Potassium 4.1; Sodium 140 09/16/2020: ALT 69   Recent Lipid Panel Lab Results  Component Value Date/Time   CHOL 128 03/04/2020 08:11 AM   TRIG 180 (H) 03/04/2020 08:11 AM   HDL 36 (L) 03/04/2020 08:11 AM   CHOLHDL 3.6 03/04/2020 08:11 AM   CHOLHDL 5.0 (H) 09/13/2016 07:38 AM   LDLCALC 62 03/04/2020 08:11 AM   LDLDIRECT 111.0 01/05/2015 10:19 AM       Risk Assessment/Calculations:      Physical Exam:    VS:  BP 134/76   Pulse 89   Ht _0  (1.651 m)   Wt 233 lb 6.4 oz (105.9 kg)   SpO2 95%   BMI 38.84 kg/m     Wt Readings from Last 3 Encounters:  09/22/20 233 lb 6.4 oz (105.9 kg)  07/02/20 236 lb 12.8 oz (107.4 kg)  06/16/20 232 lb (105.2 kg)     Constitutional:      Appearance: Healthy appearance. Not in distress.  Neck:     Vascular: No JVR.  Pulmonary:     Effort: Pulmonary effort is normal.     Breath sounds: No wheezing. No rales.  Cardiovascular:     Normal rate. Regular rhythm. Normal S1. Normal S2.     Murmurs: There is no murmur.  Edema:    Pretibial: 1+ edema of the left pretibial area.    Ankle: trace edema of the right ankle. Abdominal:     Palpations: Abdomen is soft.  Musculoskeletal:     Cervical back: Neck supple. Skin:    General: Skin is warm and dry.  Neurological:     Mental Status: Alert and oriented to person, place and time.     Cranial Nerves: Cranial nerves are intact.      ASSESSMENT & PLAN:    1. Coronary artery disease involving native coronary artery of native heart with angina pectoris [facility]) Prior history of stenting to the LCx in 2007 and again in 2019.  She had a high risk Myoview in October of last year.  Cardiac catheterization demonstrated patent stent in the LCx and moderate nonobstructive disease in the RCA and LAD.  She has been managed medically.  I placed her on isosorbide at last visit.  She did not have improvement in her symptoms.  She has actually had worsening symptoms.  She took nitroglycerin twice in the past couple of weeks.  This is the first time she has done this since her heart attack.  Electrocardiogram  today demonstrates no acute changes.  She is on 2 antianginals with CCS class III symptoms.  I have recommended proceeding with cardiac catheterization.  She is willing to proceed.  I reviewed this with Dr. Harrell Gave (attending MD), who agreed.  I will  try to arrange this with Dr. Burt Knack in the next week.  2. Chronic heart failure with preserved ejection fraction (HCC) Overall, volume appears stable.  Recent BNP was not significantly elevated.  As she does have chronic shortness of breath in addition to anginal symptoms, I will arrange a right and left heart catheterization.  Continue current dose of furosemide.  Obtain BMET today.  3. Stage 3a chronic kidney disease (HCC) Recent creatinine 1.35.  4. Essential (primary) hypertension Blood pressure somewhat borderline.  I will increase her isosorbide to 30 mg daily.  5. Mixed hyperlipidemia 6. Elevated LFTs Her atorvastatin was stopped yesterday due to elevated LFTs.  She has been referred back to PCP for further evaluation    Shared Decision Making/Informed Consent    The risks [stroke (1 in 1000), death (1 in 1000), kidney failure [usually temporary] (1 in 500), bleeding (1 in 200), allergic reaction [possibly serious] (1 in 200)], benefits (diagnostic support and management of coronary artery disease) and alternatives of a cardiac catheterization were discussed in detail with Tracy [name] and she is willing to proceed.    Dispo:  Return for Post Procedure Follow Up, w/ Dr. Burt Knack, or Richardson Dopp, PA-C, in person.   Medication Adjustments/Labs and Tests Ordered: Current medicines are reviewed at length with the patient today.  Concerns regarding medicines are outlined above.  Tests Ordered: Orders Placed This Encounter  Procedures  . Basic metabolic panel  . CBC  . EKG 12-Lead   Medication Changes: Meds ordered this encounter  Medications  . nitroGLYCERIN (NITROSTAT) 0.4 MG SL tablet    Sig: Place 1 tablet (0.4 mg total) under the tongue every 5 (five) minutes as needed for chest pain.    Dispense:  25 tablet    Refill:  3  . isosorbide mononitrate (IMDUR) 30 MG 24 hr tablet    Sig: Take 1 tablet (30 mg total) by mouth daily.    Dispense:  30 tablet    Refill:  6     Signed, Richardson Dopp, PA-C  09/22/2020 4:40 PM    [facility] [place], [place], [place]  [address] Phone: [phone]; Fax: [phone]

## 2020-09-22 ENCOUNTER — Ambulatory Visit (INDEPENDENT_AMBULATORY_CARE_PROVIDER_SITE_OTHER): Payer: Medicare Other | Admitting: Physician Assistant

## 2020-09-22 ENCOUNTER — Ambulatory Visit: Payer: Medicare Other | Admitting: Physician Assistant

## 2020-09-22 ENCOUNTER — Other Ambulatory Visit: Payer: Self-pay

## 2020-09-22 ENCOUNTER — Encounter: Payer: Self-pay | Admitting: Physician Assistant

## 2020-09-22 VITALS — BP 134/76 | HR 89 | Ht 65.0 in | Wt 233.4 lb

## 2020-09-22 DIAGNOSIS — N1831 Chronic kidney disease, stage 3a: Secondary | ICD-10-CM | POA: Diagnosis not present

## 2020-09-22 DIAGNOSIS — R0602 Shortness of breath: Secondary | ICD-10-CM | POA: Diagnosis not present

## 2020-09-22 DIAGNOSIS — I10 Essential (primary) hypertension: Secondary | ICD-10-CM

## 2020-09-22 DIAGNOSIS — I25119 Atherosclerotic heart disease of native coronary artery with unspecified angina pectoris: Secondary | ICD-10-CM

## 2020-09-22 DIAGNOSIS — R7989 Other specified abnormal findings of blood chemistry: Secondary | ICD-10-CM | POA: Diagnosis not present

## 2020-09-22 DIAGNOSIS — E782 Mixed hyperlipidemia: Secondary | ICD-10-CM | POA: Diagnosis not present

## 2020-09-22 DIAGNOSIS — I5032 Chronic diastolic (congestive) heart failure: Secondary | ICD-10-CM | POA: Diagnosis not present

## 2020-09-22 MED ORDER — NITROGLYCERIN 0.4 MG SL SUBL: 0.4000 mg | SUBLINGUAL_TABLET | SUBLINGUAL | 3 refills | Status: DC | PRN

## 2020-09-22 MED ORDER — ISOSORBIDE MONONITRATE ER 30 MG PO TB24
30.0000 mg | ORAL_TABLET | Freq: Every day | ORAL | 6 refills | Status: DC
Start: 2020-09-22 — End: 2021-03-04

## 2020-09-22 NOTE — Patient Instructions (Signed)
Medication Instructions:  Your physician has recommended you make the following change in your medication:   1) Increase Imdur to 30 mg, 1 tablet by mouth once a day  *If you need a refill on your cardiac medications before your next appointment, please call your pharmacy*  Lab Work: You will have labs drawn today: CBC/BMET  Testing/Procedures: Due to recent COVID-19 restrictions implemented by our local and state authorities and in an effort to keep both patients and staff as safe as possible, our hospital system requires COVID-19 testing prior to certain scheduled hospital procedures. Please go to [place]. [place], [place] [address] on 09/25/20 at 1:50PM. This is a drive up testing site. You will not need to exit your vehicle.  You will not be billed at the time of testing but may receive a bill later depending on your insurance. You must agree to self-quarantine from the time of your testing until the procedure date on 09/29/20. This should included staying home with ONLY the people you live with.  Avoid take-out, grocery store shopping or leaving the house for any non-emergent reason. Failure to have your COVID-19 test done on the date and time you have been scheduled will result in cancellation of your procedure. Please call our office at [phone] if you have any questions.    [address] [place] [address] Dept: [phone] Loc: [place]  09/22/2020  You are scheduled for a Cardiac Catheterization on Tuesday, December 14 with Dr. Daneen Schick.  1. Please arrive at the [facility] (Main Entrance A) at [facility]: [address] [place], [place] [address] at 5:30 AM (This time is two hours before your procedure to ensure your preparation). Free valet parking service is available.   Special note: Every effort is made to have your procedure  done on time. Please understand that emergencies sometimes delay scheduled procedures.  2. Diet: Do not eat solid foods after midnight.  The patient may have clear liquids until 5am upon the day of the procedure.  3. Medication instructions in preparation for your procedure:   Contrast Allergy: No  On the morning of your procedure, take your Aspirin and Plavix/Clopidogrel. You may use sips of water.  4. Plan for one night stay--bring personal belongings. 5. Bring a current list of your medications and current insurance cards. 6. You MUST have a responsible person to drive you home. 7. Someone MUST be with you the first 24 hours after you arrive home or your discharge will be delayed. 8. Please wear clothes that are easy to get on and off and wear slip-on shoes.  Thank you for allowing [place] to care for you!   -- [facility]  Follow-Up: On 10/13/20 at 2:15PM with Richardson Dopp, PA-C

## 2020-09-23 LAB — BASIC METABOLIC PANEL
BUN/Creatinine Ratio: 22 (ref 12–28)
BUN: 28 mg/dL — ABNORMAL HIGH (ref 8–27)
CO2: 21 mmol/L (ref 20–29)
Calcium: 9.1 mg/dL (ref 8.7–10.3)
Chloride: 105 mmol/L (ref 96–106)
Creatinine, Ser: 1.27 mg/dL — ABNORMAL HIGH (ref 0.57–1.00)
GFR calc Af Amer: 49 mL/min/{1.73_m2} — ABNORMAL LOW (ref >59)
GFR calc non Af Amer: 42 mL/min/{1.73_m2} — ABNORMAL LOW (ref >59)
Glucose: 93 mg/dL (ref 65–99)
Potassium: 4.8 mmol/L (ref 3.5–5.2)
Sodium: 142 mmol/L (ref 134–144)

## 2020-09-23 LAB — CBC
Hematocrit: 40.4 % (ref 34.0–46.6)
Hemoglobin: 13 g/dL (ref 11.1–15.9)
MCH: 27.1 pg (ref 26.6–33.0)
MCHC: 32.2 g/dL (ref 31.5–35.7)
MCV: 84 fL (ref 79–97)
Platelets: 255 10*3/uL (ref 150–450)
RBC: 4.79 x10E6/uL (ref 3.77–5.28)
RDW: 15.4 % (ref 11.7–15.4)
WBC: 9.7 10*3/uL (ref 3.4–10.8)

## 2020-09-24 ENCOUNTER — Telehealth: Payer: Self-pay | Admitting: *Deleted

## 2020-09-24 NOTE — Telephone Encounter (Signed)
Copied from 09/23/20 BMP results: Creatinine stable. K+, Hgb normal.  PLAN:  - GFR 45 >> we need to bring her in for 4 hrs of IVF prior to cath - Please arrange early arrival for hydration (5 hours prior to cath time) - Hold Losartan, Furosemide, K+ the day before and day of the cath - Stop using NSAIDs (Ibuprofen, Aleve, Motrin, Naproxen) Richardson Dopp, PA-C   09/23/2020 4:45 PM  ___  Discussed pre-procedure hydration with patient. Pt aware procedure time for [facility] 09/29/20 has been changed to 10:30 AM to allow time for hydration, arrival time is still 5:30 AM. Pt aware to hold losartan, furosemide, KCl day before and day of procedure, stop NSAIDs.

## 2020-09-25 ENCOUNTER — Other Ambulatory Visit (HOSPITAL_COMMUNITY)
Admission: RE | Admit: 2020-09-25 | Discharge: 2020-09-25 | Disposition: A | Discharge status: Home / Self Care | Payer: Medicare Other | Source: Ambulatory Visit | Attending: Interventional Cardiology | Admitting: Interventional Cardiology

## 2020-09-25 DIAGNOSIS — Z20822 Contact with and (suspected) exposure to covid-19: Secondary | ICD-10-CM | POA: Insufficient documentation

## 2020-09-25 DIAGNOSIS — Z01812 Encounter for preprocedural laboratory examination: Secondary | ICD-10-CM | POA: Diagnosis not present

## 2020-09-25 LAB — SARS CORONAVIRUS 2 (TAT 6-24 HRS): SARS Coronavirus 2: NEGATIVE

## 2020-09-27 NOTE — H&P (Signed)
Cfx stent with prior ISR Moderate Ddz in RCA and LAD Cath to define anatomy given pprogressive symptoms.

## 2020-09-28 ENCOUNTER — Telehealth: Payer: Self-pay | Admitting: *Deleted

## 2020-09-28 NOTE — Telephone Encounter (Signed)
Pt contacted pre-catheterization scheduled at [facility] for: Tuesday September 29, 2020 10:30 AM Verified arrival time and place: [facility]) at: 5:30 AM pre-procedure hydration    No solid food after midnight prior to cath, clear liquids until 5 AM day of procedure.  Hold: Losartan-day before and day of procedure-GFR 42 Lasix/KCl-day before and day of procedure-GFR 42  Except hold medications AM meds can be  taken pre-cath with sips of water including: ASA 81 mg Plavix 75 mg   Confirmed patient has responsible adult to drive home post procedure and be with patient first 24 hours after arriving home: yes  You are allowed ONE visitor in the waiting room during the time you are at the hospital for your procedure. Both you and your visitor must wear a mask once you enter the hospital.       COVID-19 Pre-Screening Questions:  . In the past 14 days have you had any symptoms concerning for COVID-19 infection (fever, chills, cough, or new shortness of breath)? no . In the past 14 days have you been around anyone with known Covid 19? no   Reviewed procedure/mask/visitor instructions, COVID-19 questions reviewed with patient.

## 2020-09-29 ENCOUNTER — Other Ambulatory Visit: Payer: Self-pay

## 2020-09-29 ENCOUNTER — Encounter (HOSPITAL_COMMUNITY)
Admission: RE | Disposition: A | Discharge status: Home / Self Care | Payer: Self-pay | Source: Home / Self Care | Attending: Interventional Cardiology

## 2020-09-29 ENCOUNTER — Ambulatory Visit (HOSPITAL_COMMUNITY)
Admission: RE | Admit: 2020-09-29 | Discharge: 2020-09-29 | Disposition: A | Discharge status: Home / Self Care | Payer: Medicare Other | Attending: Interventional Cardiology | Admitting: Interventional Cardiology

## 2020-09-29 DIAGNOSIS — I509 Heart failure, unspecified: Secondary | ICD-10-CM | POA: Insufficient documentation

## 2020-09-29 DIAGNOSIS — R0602 Shortness of breath: Secondary | ICD-10-CM

## 2020-09-29 DIAGNOSIS — N1831 Chronic kidney disease, stage 3a: Secondary | ICD-10-CM | POA: Diagnosis not present

## 2020-09-29 DIAGNOSIS — R079 Chest pain, unspecified: Secondary | ICD-10-CM | POA: Insufficient documentation

## 2020-09-29 DIAGNOSIS — Z7902 Long term (current) use of antithrombotics/antiplatelets: Secondary | ICD-10-CM | POA: Insufficient documentation

## 2020-09-29 DIAGNOSIS — R9439 Abnormal result of other cardiovascular function study: Secondary | ICD-10-CM | POA: Diagnosis present

## 2020-09-29 DIAGNOSIS — N183 Chronic kidney disease, stage 3 unspecified: Secondary | ICD-10-CM | POA: Diagnosis present

## 2020-09-29 DIAGNOSIS — E782 Mixed hyperlipidemia: Secondary | ICD-10-CM | POA: Diagnosis present

## 2020-09-29 DIAGNOSIS — I272 Pulmonary hypertension, unspecified: Secondary | ICD-10-CM | POA: Insufficient documentation

## 2020-09-29 DIAGNOSIS — Z882 Allergy status to sulfonamides status: Secondary | ICD-10-CM | POA: Insufficient documentation

## 2020-09-29 DIAGNOSIS — Z7982 Long term (current) use of aspirin: Secondary | ICD-10-CM | POA: Diagnosis not present

## 2020-09-29 DIAGNOSIS — Z79899 Other long term (current) drug therapy: Secondary | ICD-10-CM | POA: Insufficient documentation

## 2020-09-29 DIAGNOSIS — E7800 Pure hypercholesterolemia, unspecified: Secondary | ICD-10-CM | POA: Diagnosis present

## 2020-09-29 DIAGNOSIS — Z720 Tobacco use: Secondary | ICD-10-CM | POA: Diagnosis present

## 2020-09-29 DIAGNOSIS — I5032 Chronic diastolic (congestive) heart failure: Secondary | ICD-10-CM | POA: Diagnosis not present

## 2020-09-29 DIAGNOSIS — Z87891 Personal history of nicotine dependence: Secondary | ICD-10-CM | POA: Insufficient documentation

## 2020-09-29 DIAGNOSIS — I130 Hypertensive heart and chronic kidney disease with heart failure and stage 1 through stage 4 chronic kidney disease, or unspecified chronic kidney disease: Secondary | ICD-10-CM | POA: Insufficient documentation

## 2020-09-29 DIAGNOSIS — Z955 Presence of coronary angioplasty implant and graft: Secondary | ICD-10-CM | POA: Diagnosis not present

## 2020-09-29 DIAGNOSIS — I2510 Atherosclerotic heart disease of native coronary artery without angina pectoris: Secondary | ICD-10-CM | POA: Diagnosis not present

## 2020-09-29 DIAGNOSIS — I25119 Atherosclerotic heart disease of native coronary artery with unspecified angina pectoris: Secondary | ICD-10-CM | POA: Diagnosis present

## 2020-09-29 LAB — POCT I-STAT EG7
Acid-base deficit: 2 mmol/L (ref 0.0–2.0)
Bicarbonate: 24.1 mmol/L (ref 20.0–28.0)
Calcium, Ion: 1.18 mmol/L (ref 1.15–1.40)
HCT: 35 % — ABNORMAL LOW (ref 36.0–46.0)
Hemoglobin: 11.9 g/dL — ABNORMAL LOW (ref 12.0–15.0)
O2 Saturation: 74 %
Potassium: 4.2 mmol/L (ref 3.5–5.1)
Sodium: 142 mmol/L (ref 135–145)
TCO2: 25 mmol/L (ref 22–32)
pCO2, Ven: 44.3 mmHg (ref 44.0–60.0)
pH, Ven: 7.344 (ref 7.250–7.430)
pO2, Ven: 42 mmHg (ref 32.0–45.0)

## 2020-09-29 LAB — POCT I-STAT 7, (LYTES, BLD GAS, ICA,H+H)
Acid-base deficit: 2 mmol/L (ref 0.0–2.0)
Bicarbonate: 23 mmol/L (ref 20.0–28.0)
Calcium, Ion: 1.21 mmol/L (ref 1.15–1.40)
HCT: 35 % — ABNORMAL LOW (ref 36.0–46.0)
Hemoglobin: 11.9 g/dL — ABNORMAL LOW (ref 12.0–15.0)
O2 Saturation: 99 %
Potassium: 4.3 mmol/L (ref 3.5–5.1)
Sodium: 141 mmol/L (ref 135–145)
TCO2: 24 mmol/L (ref 22–32)
pCO2 arterial: 40.3 mmHg (ref 32.0–48.0)
pH, Arterial: 7.364 (ref 7.350–7.450)
pO2, Arterial: 119 mmHg — ABNORMAL HIGH (ref 83.0–108.0)

## 2020-09-29 SURGERY — RIGHT/LEFT HEART CATH AND CORONARY ANGIOGRAPHY
Anesthesia: LOCAL

## 2020-09-29 MED ORDER — LIDOCAINE HCL (PF) 1 % IJ SOLN
INTRAMUSCULAR | Status: DC | PRN
  Administered 2020-09-29 (×2): 2 mL

## 2020-09-29 MED ORDER — LABETALOL HCL 5 MG/ML IV SOLN: 10.0000 mg | INTRAVENOUS | Status: DC | PRN

## 2020-09-29 MED ORDER — VERAPAMIL HCL 2.5 MG/ML IV SOLN
INTRAVENOUS | Status: AC
  Filled 2020-09-29: qty 2

## 2020-09-29 MED ORDER — SODIUM CHLORIDE 0.9% FLUSH: 3.0000 mL | INTRAVENOUS | Status: DC | PRN

## 2020-09-29 MED ORDER — FENTANYL CITRATE (PF) 100 MCG/2ML IJ SOLN
INTRAMUSCULAR | Status: AC
  Filled 2020-09-29: qty 2

## 2020-09-29 MED ORDER — HEPARIN SODIUM (PORCINE) 1000 UNIT/ML IJ SOLN
INTRAMUSCULAR | Status: AC
  Filled 2020-09-29: qty 1

## 2020-09-29 MED ORDER — HEPARIN (PORCINE) IN NACL 1000-0.9 UT/500ML-% IV SOLN
INTRAVENOUS | Status: DC | PRN
  Administered 2020-09-29 (×2): 500 mL

## 2020-09-29 MED ORDER — MIDAZOLAM HCL 2 MG/2ML IJ SOLN
INTRAMUSCULAR | Status: AC
  Filled 2020-09-29: qty 2

## 2020-09-29 MED ORDER — IOHEXOL 350 MG/ML SOLN
INTRAVENOUS | Status: DC | PRN
  Administered 2020-09-29: 100 mL

## 2020-09-29 MED ORDER — FENTANYL CITRATE (PF) 100 MCG/2ML IJ SOLN
INTRAMUSCULAR | Status: DC | PRN
  Administered 2020-09-29 (×2): 25 ug via INTRAVENOUS

## 2020-09-29 MED ORDER — ASPIRIN 81 MG PO CHEW: 81.0000 mg | CHEWABLE_TABLET | Freq: Every day | ORAL | Status: DC

## 2020-09-29 MED ORDER — HEPARIN (PORCINE) IN NACL 1000-0.9 UT/500ML-% IV SOLN
INTRAVENOUS | Status: AC
  Filled 2020-09-29: qty 1000

## 2020-09-29 MED ORDER — ACETAMINOPHEN 325 MG PO TABS: 650.0000 mg | ORAL_TABLET | ORAL | Status: DC | PRN

## 2020-09-29 MED ORDER — VERAPAMIL HCL 2.5 MG/ML IV SOLN
INTRAVENOUS | Status: DC | PRN
  Administered 2020-09-29: 10 mL via INTRA_ARTERIAL

## 2020-09-29 MED ORDER — OXYCODONE HCL 5 MG PO TABS: 5.0000 mg | ORAL_TABLET | ORAL | Status: DC | PRN

## 2020-09-29 MED ORDER — HYDRALAZINE HCL 20 MG/ML IJ SOLN: 10.0000 mg | INTRAMUSCULAR | Status: DC | PRN

## 2020-09-29 MED ORDER — HEPARIN SODIUM (PORCINE) 1000 UNIT/ML IJ SOLN
INTRAMUSCULAR | Status: DC | PRN
  Administered 2020-09-29: 5000 [IU] via INTRAVENOUS

## 2020-09-29 MED ORDER — MIDAZOLAM HCL 2 MG/2ML IJ SOLN
INTRAMUSCULAR | Status: DC | PRN
  Administered 2020-09-29 (×2): 1 mg via INTRAVENOUS

## 2020-09-29 MED ORDER — SODIUM CHLORIDE 0.9 % IV SOLN: 250.0000 mL | INTRAVENOUS | Status: DC | PRN

## 2020-09-29 MED ORDER — ONDANSETRON HCL 4 MG/2ML IJ SOLN: 4.0000 mg | Freq: Four times a day (QID) | INTRAMUSCULAR | Status: DC | PRN

## 2020-09-29 MED ORDER — SODIUM CHLORIDE 0.9% FLUSH: 3.0000 mL | Freq: Two times a day (BID) | INTRAVENOUS | Status: DC

## 2020-09-29 MED ORDER — SODIUM CHLORIDE 0.9 % WEIGHT BASED INFUSION
1.0000 mL/kg/h | INTRAVENOUS | Status: DC
  Administered 2020-09-29: 1 mL/kg/h via INTRAVENOUS

## 2020-09-29 MED ORDER — LIDOCAINE HCL (PF) 1 % IJ SOLN
INTRAMUSCULAR | Status: AC
  Filled 2020-09-29: qty 30

## 2020-09-29 MED ORDER — SODIUM CHLORIDE 0.9 % IV SOLN: INTRAVENOUS | Status: DC

## 2020-09-29 MED ORDER — SODIUM CHLORIDE 0.9 % WEIGHT BASED INFUSION
3.0000 mL/kg/h | INTRAVENOUS | Status: AC
  Administered 2020-09-29: 3 mL/kg/h via INTRAVENOUS

## 2020-09-29 MED ORDER — ASPIRIN 81 MG PO CHEW: 81.0000 mg | CHEWABLE_TABLET | ORAL | Status: DC

## 2020-09-29 SURGICAL SUPPLY — 13 items
CATH BALLN WEDGE 5F 110CM (CATHETERS) ×2 IMPLANT
CATH INFINITI MULTIPACK ANG 4F (CATHETERS) ×2 IMPLANT
DEVICE RAD COMP TR BAND LRG (VASCULAR PRODUCTS) ×2 IMPLANT
GLIDESHEATH SLEND A-KIT 6F 22G (SHEATH) ×2 IMPLANT
GUIDEWIRE INQWIRE 1.5J.035X260 (WIRE) ×1 IMPLANT
INQWIRE 1.5J .035X260CM (WIRE) ×2
KIT HEART LEFT (KITS) ×2 IMPLANT
PACK CARDIAC CATHETERIZATION (CUSTOM PROCEDURE TRAY) ×2 IMPLANT
SHEATH GLIDE SLENDER 4/5FR (SHEATH) ×2 IMPLANT
SHEATH PROBE COVER 6X72 (BAG) ×2 IMPLANT
TRANSDUCER W/STOPCOCK (MISCELLANEOUS) ×4 IMPLANT
TUBING CIL FLEX 10 FLL-RA (TUBING) ×2 IMPLANT
WIRE HITORQ VERSACORE ST 145CM (WIRE) ×2 IMPLANT

## 2020-09-29 NOTE — Discharge Instructions (Signed)

## 2020-09-29 NOTE — Progress Notes (Signed)
Discharge instructions reviewed with pt and her husband (via telephone) both voice understanding.  

## 2020-09-29 NOTE — Interval H&P Note (Signed)
Cath Lab Visit (complete for each Cath Lab visit)  Clinical Evaluation Leading to the Procedure:   ACS: Yes.    Non-ACS:    Anginal Classification: CCS III  Anti-ischemic medical therapy: Minimal Therapy (1 class of medications)  Non-Invasive Test Results: No non-invasive testing performed  Prior CABG: No previous CABG      History and Physical Interval Note:  09/29/2020 11:14 AM  Tracy [name]  has presented today for surgery, with the diagnosis of heart failure, CAD.  The various methods of treatment have been discussed with the patient and family. After consideration of risks, benefits and other options for treatment, the patient has consented to  Procedure(s): RIGHT/LEFT HEART CATH AND CORONARY ANGIOGRAPHY (N/A) as a surgical intervention.  The patient's history has been reviewed, patient examined, no change in status, stable for surgery.  I have reviewed the patient's chart and labs.  Questions were answered to the patient's satisfaction.     Belva Crome III

## 2020-09-29 NOTE — Research (Signed)
[place] Informed Consent   Subject Name: Tracy [name]  Subject met inclusion and exclusion criteria.  The informed consent form, study requirements and expectations were reviewed with the subject and questions and concerns were addressed prior to the signing of the consent form.  The subject verbalized understanding of the trial requirements.  The subject agreed to participate in the [facility] trial and signed the informed consent at [place] on 09/29/2020.  The informed consent was obtained prior to performance of any protocol-specific procedures for the subject.  A copy of the signed informed consent was given to the subject and a copy was placed in the subject's medical record.   [name]

## 2020-09-29 NOTE — CV Procedure (Signed)
   Severe three-vessel calcification and tortuosity.  Patent left main  Segmental 50 to 60% proximal to mid LAD.  Totally occluded for several perforator which fills by collaterals from the right coronary.  Severely angulated calcified mid to distal LAD with tandem 60% stenoses unchanged from prior.  Overlapping stents in the circumflex are widely patent.  Vessel is heavily calcified.  Dominant right coronary with distal branch tortuosity.  No significant obstruction.  LVEDP 14 mmHg.  EF 50%.  Normal right heart pressures.  Mean wedge pressure of 15 mmHg.  Viewed medical therapy with aggressive risk factor modification.

## 2020-09-30 ENCOUNTER — Encounter (HOSPITAL_COMMUNITY): Payer: Self-pay | Admitting: Interventional Cardiology

## 2020-10-05 ENCOUNTER — Encounter: Payer: Self-pay | Admitting: Family Medicine

## 2020-10-05 ENCOUNTER — Ambulatory Visit (INDEPENDENT_AMBULATORY_CARE_PROVIDER_SITE_OTHER): Payer: Medicare Other

## 2020-10-05 ENCOUNTER — Ambulatory Visit (INDEPENDENT_AMBULATORY_CARE_PROVIDER_SITE_OTHER): Payer: Medicare Other | Admitting: Family Medicine

## 2020-10-05 ENCOUNTER — Other Ambulatory Visit: Payer: Self-pay

## 2020-10-05 VITALS — BP 160/100 | HR 86 | Temp 98.9°F | Resp 16 | Ht 65.0 in | Wt 235.4 lb

## 2020-10-05 DIAGNOSIS — I10 Essential (primary) hypertension: Secondary | ICD-10-CM | POA: Diagnosis not present

## 2020-10-05 DIAGNOSIS — R0609 Other forms of dyspnea: Secondary | ICD-10-CM

## 2020-10-05 DIAGNOSIS — R0600 Dyspnea, unspecified: Secondary | ICD-10-CM | POA: Diagnosis not present

## 2020-10-05 DIAGNOSIS — Z720 Tobacco use: Secondary | ICD-10-CM

## 2020-10-05 DIAGNOSIS — K219 Gastro-esophageal reflux disease without esophagitis: Secondary | ICD-10-CM | POA: Diagnosis not present

## 2020-10-05 DIAGNOSIS — R0602 Shortness of breath: Secondary | ICD-10-CM | POA: Diagnosis not present

## 2020-10-05 DIAGNOSIS — N1831 Chronic kidney disease, stage 3a: Secondary | ICD-10-CM

## 2020-10-05 DIAGNOSIS — R7401 Elevation of levels of liver transaminase levels: Secondary | ICD-10-CM

## 2020-10-05 DIAGNOSIS — R5383 Other fatigue: Secondary | ICD-10-CM

## 2020-10-05 DIAGNOSIS — R059 Cough, unspecified: Secondary | ICD-10-CM | POA: Diagnosis not present

## 2020-10-05 DIAGNOSIS — I25119 Atherosclerotic heart disease of native coronary artery with unspecified angina pectoris: Secondary | ICD-10-CM | POA: Diagnosis not present

## 2020-10-05 DIAGNOSIS — J301 Allergic rhinitis due to pollen: Secondary | ICD-10-CM

## 2020-10-05 DIAGNOSIS — J90 Pleural effusion, not elsewhere classified: Secondary | ICD-10-CM | POA: Diagnosis not present

## 2020-10-05 NOTE — Progress Notes (Signed)
HPI: Tracy [name] is a 74 y.o. female, who is here today for follow up. She was last seen on 07/17/20. Since her last visit she underwent cardiac cath because left-sided upper chest pain and SOB.  Pain is not associated with exertion, sudden onset sharp pain and then dull pain that last hours sometimes.  According to pt, cardiac cath showed CAD but was not bad enough for stenting. Left/right cardiac cath on 09/29/20:  Right dominant coronary anatomy with no noted obstructive disease in the RCA.  Patent relatively short left main.  Large tortuous and angulated LAD with segmental 50 to 60% proximal to mid narrowing and tandem mid to distal 70% stenoses after the large first diagonal.  The diagonal contains 50% proximal narrowing.  The vessel is tortuous and heavily calcified.  Circumflex contains a long, possibly overlapping stented segment that is widely patent.  The vessel is heavily calcified.  Mid range LVEF estimated at 50% with EDP 14 m mercury.  Mild pulmonary hypertension with mean pressure 24 mmHg.  Capillary wedge pressure mean is 15 mmHg.  Pulmonary vascular resistance is 1.42 Wood units.  The minimal elevation in pressure is suggested to be WHO group 3 or 5.  Statin was discontinued because abnormal renal and liver test.  HTN: She resumed Losartan 100 mg and Furosemide yesterday. She is also on Metoprolol succinate 100 mg daily and Imdur 30 mg daily. She has not checked BP at home. Negative for severe/frequent headache, visual changes,palpitation, focal weakness, or unusual edema.  Lab Results  Component Value Date   CREATININE 1.27 (H) 09/22/2020   BUN 28 (H) 09/22/2020   NA 141 09/29/2020   K 4.3 09/29/2020   CL 105 09/22/2020   CO2 21 09/22/2020    -She has had nasal congestion, little sore throat, rhinorrhea. She is taking Nyquil.  Sore throat and rhinorrhea are better. Intermittent episodes of productive cough with small amount of sputum, no  hemoptysis. SOB and wheezing associated with cough. Exacerbated by exertion. Alleviated by rest.  No known OSA. She does not feel rested when she gets up in the morning.  She takes Zyrtec for allergies.  She is following with pulmonologist for SOB and Dx'ed with COPD. She was started on ANORO Ellipta 62.5-25 mcg 1 puff daily. Problem has not improved with inhaler treatments but it is stable.  Started smoking at age 18 and quit 2-3 years ago. She smoked 1 PPD.  +Nose bleed, attributes to dry air. No fever, no more fatigue than usual.  Nausea when she has CP. GERD: She has not noted acid reflux or vomiting. She is on Protonix 20 mg daily.  Elevated transaminases. She denies alcohol intake.  Lab Results  Component Value Date   ALT 69 (H) 09/16/2020   AST 88 (H) 09/16/2020   ALKPHOS 109 09/16/2020   BILITOT 0.4 09/16/2020   Lab Results  Component Value Date   WBC 9.7 09/22/2020   HGB 11.9 (L) 09/29/2020   HCT 35.0 (L) 09/29/2020   MCV 84 09/22/2020   PLT 255 09/22/2020   Knee OA, she was taking Aleve.   Review of Systems  Constitutional: Positive for fatigue. Negative for activity change and appetite change.  HENT: Negative for mouth sores, nosebleeds and sore throat.   Eyes: Negative for redness and visual disturbance.  Gastrointestinal: Negative for abdominal pain, nausea and vomiting.       Negative for changes in bowel habits.  Genitourinary: Negative for decreased urine volume and  hematuria.  Musculoskeletal: Negative for gait problem and myalgias.  Allergic/Immunologic: Positive for environmental allergies.  Neurological: Negative for syncope, facial asymmetry and weakness.  Psychiatric/Behavioral: Negative for confusion. The patient is nervous/anxious.   Rest see pertinent positives and negatives per HPI.  Current Outpatient Medications on File Prior to Visit  Medication Sig Dispense Refill  . acetaminophen (TYLENOL) 500 MG tablet Take 500-1,000 mg by  mouth every 6 (six) hours as needed for headache.     . albuterol (PROVENTIL) (2.5 MG/3ML) 0.083% nebulizer solution Take 3 mLs (2.5 mg total) by nebulization every 6 (six) hours as needed for wheezing or shortness of breath. 300 mL 5  . allopurinol (ZYLOPRIM) 100 MG tablet Take 1 tablet (100 mg total) by mouth daily. 90 tablet 2  . ALPRAZolam (XANAX) 0.25 MG tablet Take 1 tablet (0.25 mg total) by mouth daily as needed for anxiety. TAKE 1 TABLET TWICE A DAY AS NEEDED FOR ANXIETY (Patient taking differently: Take 0.25 mg by mouth 2 (two) times daily as needed for anxiety.) 30 tablet 0  . ANORO ELLIPTA 62.5-25 MCG/INH AEPB TAKE 1 PUFF BY MOUTH EVERY DAY (Patient taking differently: Inhale 1 puff into the lungs daily.) 60 each 12  . APPLE CIDER VINEGAR PO Take 2,400 mg by mouth daily. 1200 mg each    . aspirin EC 81 MG tablet Take 81 mg by mouth daily.    . calcium carbonate (CALCIUM 600) 600 MG TABS tablet Take 600 mg by mouth every 14 (fourteen) days.     . cetirizine (ZYRTEC) 10 MG tablet Take 10 mg by mouth daily.    . Cholecalciferol (VITAMIN D3) 50 MCG (2000 UT) TABS Take 4,000 Units by mouth daily.     . clopidogrel (PLAVIX) 75 MG tablet TAKE 1 TABLET (75 MG TOTAL) BY MOUTH DAILY WITH BREAKFAST. 90 tablet 3  . Cyanocobalamin (VITAMIN B-12) 5000 MCG SUBL Place 5,000 mcg under the tongue daily.     Marland Kitchen ezetimibe (ZETIA) 10 MG tablet Take 1 tablet (10 mg total) by mouth daily. 90 tablet 3  . fluticasone (FLONASE) 50 MCG/ACT nasal spray SPRAY 2 SPRAYS INTO EACH NOSTRIL EVERY DAY (Patient taking differently: Place 2 sprays into both nostrils daily.) 48 mL 1  . furosemide (LASIX) 40 MG tablet TAKE 1 TABLET BY MOUTH EVERY DAY (Patient taking differently: Take 40 mg by mouth daily.) 90 tablet 3  . isosorbide mononitrate (IMDUR) 30 MG 24 hr tablet Take 1 tablet (30 mg total) by mouth daily. 30 tablet 6  . losartan (COZAAR) 100 MG tablet TAKE 1 TABLET BY MOUTH EVERY DAY (Patient taking differently: Take 100  mg by mouth daily.) 90 tablet 3  . metoprolol succinate (TOPROL-XL) 100 MG 24 hr tablet TAKE 1 TABLET (100 MG TOTAL) BY MOUTH DAILY. TAKE WITH OR IMMEDIATELY FOLLOWING A MEAL. 90 tablet 0  . Misc Natural Products (TART CHERRY ADVANCED) CAPS Take 1,000 capsules by mouth daily.     . nitroGLYCERIN (NITROSTAT) 0.4 MG SL tablet Place 1 tablet (0.4 mg total) under the tongue every 5 (five) minutes as needed for chest pain. 25 tablet 3  . pantoprazole (PROTONIX) 20 MG tablet TAKE 1 TABLET BY MOUTH EVERY DAY (Patient taking differently: Take 20 mg by mouth daily.) 90 tablet 1  . potassium chloride SA (KLOR-CON M20) 20 MEQ tablet Take 1 tablet (20 mEq total) by mouth 2 (two) times daily. 180 tablet 3  . VENTOLIN HFA 108 (90 Base) MCG/ACT inhaler INHALE 2 PUFFS INTO THE LUNGS  EVERY 4 (FOUR) HOURS AS NEEDED FOR WHEEZING OR SHORTNESS OF BREATH. (Patient taking differently: Inhale 2 puffs into the lungs every 4 (four) hours as needed for wheezing.) 18 g 5   No current facility-administered medications on file prior to visit.   Past Medical History:  Diagnosis Date  . Allergic rhinitis   . CAD (coronary artery disease)    1/19 PCI/DES to [facility] for ISR, normal EF.   Marland Kitchen CHF (congestive heart failure) ([place])    Echo 06/2019: EF 55-60, elevated LVEDP, normal RV SF, mild MAC, mild MR, trivial TR  . COPD (chronic obstructive pulmonary disease) ([place])   . GERD (gastroesophageal reflux disease)   . Gout   . Headache(784.0)   . Hyperlipidemia   . Hypertension   . Low back pain   . Menopausal syndrome   . Myocardial infarct ([place]) 2007   hx of  . Overactive bladder    Allergies  Allergen Reactions  . Erythromycin Rash    But isn't certain  . Sulfamethoxazole Rash    Social History   Socioeconomic History  . Marital status: Married    Spouse name: Not on file  . Number of children: Not on file  . Years of education: Not on file  . Highest education level: Not on file  Occupational History  .  Occupation: direct Civil engineer, contracting  Tobacco Use  . Smoking status: Former Smoker    Packs/day: 0.20    Years: 50.00    Pack years: 10.00    Quit date: 07/01/2012    Years since quitting: 8.2  . Smokeless tobacco: Never Used  . Tobacco comment: completely quit May of 2018; period of years she did not smoke   Vaping Use  . Vaping Use: Never used  Substance and Sexual Activity  . Alcohol use: Yes    Comment: seldom  . Drug use: No  . Sexual activity: Not on file  Other Topics Concern  . Not on file  Social History Narrative  . Not on file   Social Determinants of Health   Financial Resource Strain: Not on file  Food Insecurity: Not on file  Transportation Needs: Not on file  Physical Activity: Not on file  Stress: Not on file  Social Connections: Not on file    Vitals:   10/05/20 0908  BP: (!) 160/100  Pulse: 86  Resp: 16  Temp: 98.9 F (37.2 C)  SpO2: 98%   Body mass index is 39.17 kg/m.  Physical Exam Vitals and nursing note reviewed.  Constitutional:      General: She is not in acute distress.    Appearance: She is well-developed.  HENT:     Head: Normocephalic and atraumatic.     Mouth/Throat:     Mouth: Oropharynx is clear and moist and mucous membranes are normal. Mucous membranes are moist.     Pharynx: Oropharynx is clear.  Eyes:     Conjunctiva/sclera: Conjunctivae normal.     Pupils: Pupils are equal, round, and reactive to light.  Cardiovascular:     Rate and Rhythm: Normal rate and regular rhythm.     Pulses:          Dorsalis pedis pulses are 2+ on the right side and 2+ on the left side.     Heart sounds: No murmur heard.     Comments: LLE lymphedema  1-2+ pitting LE edema, bilateral. Pulmonary:     Effort: Pulmonary effort is normal. No respiratory distress.  Breath sounds: Normal breath sounds.  Chest:     Chest wall: No tenderness.     Comments: Productive cough a couple times during visit. Abdominal:     Palpations:  Abdomen is soft. There is no hepatomegaly or mass.     Tenderness: There is no abdominal tenderness.  Musculoskeletal:     Right lower leg: Edema present.     Left lower leg: Edema present.  Lymphadenopathy:     Cervical: No cervical adenopathy.  Skin:    General: Skin is warm.     Findings: No erythema or rash.  Neurological:     Mental Status: She is alert and oriented to person, place, and time.     Cranial Nerves: No cranial nerve deficit.     Deep Tendon Reflexes: Strength normal.     Comments: Gait assisted with a cane.  Psychiatric:        Mood and Affect: Mood and affect normal.     Comments: Well groomed, good eye contact.   ASSESSMENT AND PLAN:  Tracy [name] was seen today for follow-up.  Diagnoses and all orders for this visit: Orders Placed This Encounter  Procedures  . DG Chest 2 View  . US Abdomen Limited RUQ (LIVER/GB)  . Ambulatory Referral for Lung Cancer Scre    DOE (dyspnea on exertion) Possible causes discussed. Mildly worse in the apst few weeks. Auscultation today negative. Further recommendations according to CXR.  Elevated transaminase level Problem since 2017. Hx of fatty liver (04/2016 RUQ [place]: Probable steatosis). For now continue holding statins. RUQ [place] will be arranged.  Essential hypertension BP is not well controlled, she just resumed Losartan 100 mg yesterday, so no changes in current management. Stop NSAID;s. Monitor BP at home.  Stage 3a chronic kidney disease (HCC) Resumed Losartan. Adequate hydration, avoid NSAID's. Low salt diet. Adequate BP controlled.  Other fatigue We discussed possible etiologies: Systemic illness, immunologic,endocrinology,sleep disorder, psychiatric/psychologic, infectious,medications side effects, and idiopathic. A few of ehr chronic medical problems and meds can aggravate problem. She is not interested in sleep study, she will let me know. Examination today does not suggest a serious  process. Healthy diet and regular physical activity may help.  Tobacco abuse We discussed current guideline in regard to lung cancer screening. Screening will be arranged.  Allergic rhinitis due to pollen, unspecified seasonality Stop Zyrtec for now. Plain mucinex may help with cough and congestion.  Gastroesophageal reflux disease without esophagitis This problem can be contributing factor to some of her complaints. Continue Protonix 20 mg daily and GERD precautions. Wt loss will also help.  Spent 45 minutes.  During this time history was obtained and documented, examination was performed, prior labs/imaging reviewed, and assessment/plan discussed.  Return in about 6 weeks (around 11/16/2020).  Magalie Almon G. [name], MD  [facility]. [place] office.   A few things to remember from today's visit:   DOE (dyspnea on exertion) - Plan: DG Chest 2 View  Elevated transaminase level - Plan: US Abdomen Limited RUQ (LIVER/GB)  If you need refills please call your pharmacy. Do not use My Chart to request refills or for acute issues that need immediate attention.   Stop Zyrtec for a few weeks. Plain Mucinex daily may help. We need to consider sleep apnea as causative factor for some of your symptoms.  Daily walking, start with 5 min or less and increased by 3-5 min every 2 weeks. For knee pain you can take Tylenol up to 2 g (500 mg tab  max 4 tabs). Voltaren gel is a good option. No Aleve or Ibuprofen.  We may need to discuss pain management next visit.  Please be sure medication list is accurate. If a new problem present, please set up appointment sooner than planned today.

## 2020-10-05 NOTE — Patient Instructions (Addendum)
A few things to remember from today's visit:   DOE (dyspnea on exertion) - Plan: DG Chest 2 View  Elevated transaminase level - Plan: US Abdomen Limited RUQ (LIVER/GB)  If you need refills please call your pharmacy. Do not use My Chart to request refills or for acute issues that need immediate attention.   Stop Zyrtec for a few weeks. Plain Mucinex daily may help. We need to consider sleep apnea as causative factor for some of your symptoms.  Daily walking, start with 5 min or less and increased by 3-5 min every 2 weeks. For knee pain you can take Tylenol up to 2 g (500 mg tab max 4 tabs). Voltaren gel is a good option. No Aleve or Ibuprofen.  We may need to discuss pain management next visit.  Please be sure medication list is accurate. If a new problem present, please set up appointment sooner than planned today.

## 2020-10-06 ENCOUNTER — Ambulatory Visit: Payer: Medicare Other | Admitting: Family Medicine

## 2020-10-08 ENCOUNTER — Encounter: Payer: Self-pay | Admitting: Family Medicine

## 2020-10-11 ENCOUNTER — Other Ambulatory Visit: Payer: Self-pay | Admitting: Emergency Medicine

## 2020-10-13 ENCOUNTER — Ambulatory Visit (INDEPENDENT_AMBULATORY_CARE_PROVIDER_SITE_OTHER): Payer: Medicare Other | Admitting: Physician Assistant

## 2020-10-13 ENCOUNTER — Other Ambulatory Visit: Payer: Self-pay

## 2020-10-13 ENCOUNTER — Encounter: Payer: Self-pay | Admitting: Physician Assistant

## 2020-10-13 VITALS — BP 144/98 | HR 108 | Ht 65.0 in | Wt 235.8 lb

## 2020-10-13 DIAGNOSIS — I5032 Chronic diastolic (congestive) heart failure: Secondary | ICD-10-CM | POA: Diagnosis not present

## 2020-10-13 DIAGNOSIS — E782 Mixed hyperlipidemia: Secondary | ICD-10-CM

## 2020-10-13 DIAGNOSIS — R7989 Other specified abnormal findings of blood chemistry: Secondary | ICD-10-CM | POA: Diagnosis not present

## 2020-10-13 DIAGNOSIS — N1831 Chronic kidney disease, stage 3a: Secondary | ICD-10-CM

## 2020-10-13 DIAGNOSIS — J449 Chronic obstructive pulmonary disease, unspecified: Secondary | ICD-10-CM | POA: Diagnosis not present

## 2020-10-13 DIAGNOSIS — I10 Essential (primary) hypertension: Secondary | ICD-10-CM | POA: Diagnosis not present

## 2020-10-13 DIAGNOSIS — I25119 Atherosclerotic heart disease of native coronary artery with unspecified angina pectoris: Secondary | ICD-10-CM

## 2020-10-13 DIAGNOSIS — R0602 Shortness of breath: Secondary | ICD-10-CM | POA: Diagnosis not present

## 2020-10-13 MED ORDER — AMLODIPINE BESYLATE 5 MG PO TABS: 5.0000 mg | ORAL_TABLET | Freq: Every day | ORAL | 3 refills | Status: DC

## 2020-10-13 NOTE — Progress Notes (Signed)
Cardiology Office Note:    Date:  10/13/2020   ID:  Tracy [name], DOB July 26, 1946, MRN [id number]  PCP:  Tracy [name], Tracy G, MD  [facility] Cardiologist:  Sherren Mocha, MD   [facility] Electrophysiologist:  None   Referring MD: Tracy [name], Tracy G, MD   Chief Complaint:   Hospitalization Follow-up (S/p cardiac catheterization)    Patient Profile:    Tracy [name] is a 74 y.o. female with:   Coronary artery disease  ? S/p BMS to LCx in 2007 ? S/p DES to LCx in 10/2017 ? Myoview 07/2019: ant ischemia; high risk ? Cath 07/2019: patent LCx stent, mod diff dz in LAD and mild dz in RCA - med Rx   Heart failure with preserved ejection fraction  ? Echocardiogram 12/18: EF 50-55, Gr 1 DD  Chronic kidney disease 3  COPD  Hypertension   Hyperlipidemia  Prior CV studies: Cardiac catheterization Oct 16, 2020 LAD ost 30, prox 50, mid 60, 70; D1 ost 40; 1st septal perf 100 (R-L collats) LCx ost 25, prox stent patent with 10 ISR RCA prox 25 Mean PAP 24 mmHg, PCWP 15 mmHg  EF 50-55   Cardiac catheterization 07/24/2019 LM ok LAD mod tortuous; ost 30, prox 50, mid 60, dist 40 LCx ost 25, prox stent patent w 10 ISR RCA prox 25  Myoview 07/18/2019 EF 32, lat infarct, +anterior ischemia; high risk  Echocardiogram 07/17/2019 EF 55-60, Gr 1 DD, normal RVSF, mild MR, trivial TR  Cardiac catheterization 11/06/2017 Conclusions: 1. Mild to moderate coronary artery disease involving LAD and RCA, not significantly change since 2011/20112. 2. 50-60% in-stent restenosis in proximal portion of mid LCx stent, corresponding to area of ischemia on recent myocardial perfusion stress test. 3. Mildly elevated left ventricular filling pressure.. 4. Successful PCI to proximal/mid LCx with placement of a Synergy 3.0 x 20 mm DES with 0% residual stenosis and TIMI-3 flow.  Myoview 10/13/17 EF 51,There is a medium size, moderate severity reversible defect in the basal and mid  inferior and inferolateral walls consistent with ischemia; Intermediate Risk   Echocardiogram 2017/10/16 Mild LVH, EF 50-55, no RWMA, Gr 1 DD  Echocardiogram 10/04/16 EF 45-50  History of Present Illness:    [name] was last seen on 09/22/20.  She noted more frequent chest pain.  She had taken NTG several times.  She felt like her symptoms were similar to her prior MIs.  She was set up for a cardiac catheterization. This demonstrated a large tortuous LAD with segmental 50-60% proximal and mid stenoses and tandem mid to distal 70% stenoses after the large 1st Dx.  The LCx stent was patent and here was no significant disease in the RCA.  There was mild pulmonary hypertension noted.  Med Rx was recommended.  Of note, she saw her PCP 10/05/20 and noted symptoms of shortness of breath.  A CXR was normal.  She does have an hepatic US pending for evaluation of elevated LFTs.   The pt returns for f/u.  She is here alone.  She continues to have shortness of breath with some activities.  This overall seems to be better.  She continues to have occasional episodes of chest discomfort.  This is left-sided.  She has fairly chronic lower extremity edema.  This is much worse on the left.  She has a lot of issues with arthritis in her knee on that side.  She has not had syncope.    Past Medical History:  Diagnosis Date  .  Allergic rhinitis   . CAD (coronary artery disease)    1/19 PCI/DES to [place] for ISR, normal EF.   Marland Kitchen CHF (congestive heart failure) ([place])    Echo 06/2019: EF 55-60, elevated LVEDP, normal RV SF, mild MAC, mild MR, trivial TR  . COPD (chronic obstructive pulmonary disease) ([place])   . GERD (gastroesophageal reflux disease)   . Gout   . Headache(784.0)   . Hyperlipidemia   . Hypertension   . Low back pain   . Menopausal syndrome   . Myocardial infarct ([place]) 2007   hx of  . Overactive bladder     Current Medications: Current Meds  Medication Sig  . acetaminophen (TYLENOL) 500 MG  tablet Take 500-1,000 mg by mouth every 6 (six) hours as needed for headache.   . albuterol (PROVENTIL) (2.5 MG/3ML) 0.083% nebulizer solution Take 3 mLs (2.5 mg total) by nebulization every 6 (six) hours as needed for wheezing or shortness of breath.  Marland Kitchen albuterol (VENTOLIN HFA) 108 (90 Base) MCG/ACT inhaler Inhale 2 puffs into the lungs every 4 (four) hours as needed for wheezing.  Marland Kitchen allopurinol (ZYLOPRIM) 100 MG tablet Take 1 tablet (100 mg total) by mouth daily.  Marland Kitchen ALPRAZolam (XANAX) 0.25 MG tablet Take 0.25 mg by mouth 3 times/day as needed-between meals & bedtime for anxiety or sleep.  Marland Kitchen amLODipine (NORVASC) 5 MG tablet Take 1 tablet (5 mg total) by mouth daily.  Jearl Klinefelter ELLIPTA 62.5-25 MCG/INH AEPB TAKE 1 PUFF BY MOUTH EVERY DAY  . APPLE CIDER VINEGAR PO Take 2,400 mg by mouth daily.  Marland Kitchen aspirin EC 81 MG tablet Take 81 mg by mouth daily.  . calcium carbonate (CALCIUM 600) 600 MG TABS tablet Take 600 mg by mouth every 14 (fourteen) days.   . cetirizine (ZYRTEC) 10 MG tablet Take 10 mg by mouth daily.  . Cholecalciferol (VITAMIN D3) 50 MCG (2000 UT) TABS Take 4,000 Units by mouth daily.   . clopidogrel (PLAVIX) 75 MG tablet TAKE 1 TABLET (75 MG TOTAL) BY MOUTH DAILY WITH BREAKFAST.  Marland Kitchen Cyanocobalamin (VITAMIN B-12) 5000 MCG SUBL Place 5,000 mcg under the tongue daily.   Marland Kitchen ezetimibe (ZETIA) 10 MG tablet Take 1 tablet (10 mg total) by mouth daily.  Marland Kitchen Fexofenadine HCl (MUCINEX ALLERGY PO) Take 1 tablet by mouth daily.  . fluticasone (FLONASE) 50 MCG/ACT nasal spray SPRAY 2 SPRAYS INTO EACH NOSTRIL EVERY DAY  . furosemide (LASIX) 40 MG tablet Take 40 mg by mouth every other day.  . isosorbide mononitrate (IMDUR) 30 MG 24 hr tablet Take 1 tablet (30 mg total) by mouth daily.  Marland Kitchen losartan (COZAAR) 100 MG tablet TAKE 1 TABLET BY MOUTH EVERY DAY  . metoprolol succinate (TOPROL-XL) 100 MG 24 hr tablet TAKE 1 TABLET (100 MG TOTAL) BY MOUTH DAILY. TAKE WITH OR IMMEDIATELY FOLLOWING A MEAL.  Marland Kitchen Misc Natural  Products (TART CHERRY ADVANCED) CAPS Take 1,000 capsules by mouth daily.   . nitroGLYCERIN (NITROSTAT) 0.4 MG SL tablet Place 1 tablet (0.4 mg total) under the tongue every 5 (five) minutes as needed for chest pain.  . pantoprazole (PROTONIX) 20 MG tablet TAKE 1 TABLET BY MOUTH EVERY DAY  . potassium chloride SA (KLOR-CON) 20 MEQ tablet Take 20 mEq by mouth daily.     Allergies:   Erythromycin and Sulfamethoxazole   Social History   Tobacco Use  . Smoking status: Former Smoker    Packs/day: 0.20    Years: 50.00    Pack years: 10.00  Quit date: 07/01/2012    Years since quitting: 8.2  . Smokeless tobacco: Never Used  . Tobacco comment: completely quit May of 2018; period of years she did not smoke   Vaping Use  . Vaping Use: Never used  Substance Use Topics  . Alcohol use: Yes    Comment: seldom  . Drug use: No     Family Hx: The patient's family history includes Colon cancer (age of onset: 53) in her mother; Other in an other family member. There is no history of Stomach cancer. She was adopted.  Review of Systems  HENT: Positive for nosebleeds.      EKGs/Labs/Other Test Reviewed:    EKG:  EKG is not ordered today.  The ekg ordered today demonstrates n/a  Recent Labs: 06/16/2020: NT-Pro BNP 349 09/16/2020: ALT 69 09/22/2020: BUN 28; Creatinine, Ser 1.27; Platelets 255 09/29/2020: Hemoglobin 11.9; Potassium 4.3; Sodium 141   Recent Lipid Panel Lab Results  Component Value Date/Time   CHOL 128 03/04/2020 08:11 AM   TRIG 180 (H) 03/04/2020 08:11 AM   HDL 36 (L) 03/04/2020 08:11 AM   CHOLHDL 3.6 03/04/2020 08:11 AM   CHOLHDL 5.0 (H) 09/13/2016 07:38 AM   LDLCALC 62 03/04/2020 08:11 AM   LDLDIRECT 111.0 01/05/2015 10:19 AM    Risk Assessment/Calculations:      Physical Exam:    VS:  BP (!) 144/98   Pulse (!) 108   Ht _0  (1.651 m)   Wt 235 lb 12.8 oz (107 kg)   SpO2 92%   BMI 39.24 kg/m     Wt Readings from Last 3 Encounters:  10/13/20 235 lb 12.8 oz  (107 kg)  10/05/20 235 lb 6.4 oz (106.8 kg)  09/29/20 233 lb (105.7 kg)     Constitutional:      Appearance: Healthy appearance. Not in distress.  Neck:     Vascular: No JVR.  Pulmonary:     Effort: Pulmonary effort is normal.     Breath sounds: No wheezing. No rales.  Cardiovascular:     Normal rate. Regular rhythm. Normal S1. Normal S2.     Murmurs: There is no murmur.  Edema:    Pretibial: 1+ edema of the left pretibial area and trace edema of the right pretibial area.    Ankle: 1+ edema of the left ankle and trace edema of the right ankle. Abdominal:     Palpations: Abdomen is soft.  Skin:    General: Skin is warm and dry.  Neurological:     Mental Status: Alert and oriented to person, place and time.     Cranial Nerves: Cranial nerves are intact.      ASSESSMENT & PLAN:    1. Coronary artery disease involving native coronary artery of native heart with angina pectoris ([place]) 2. Shortness of breath Prior history of stenting to the LCx in 2007 and again in 2019.  She recently underwent cardiac catheterization due to worsening symptoms.  This demonstrated patent stent the LCx and moderate nonobstructive disease in the LAD.  She continues to have episodes of chest discomfort as well as shortness of breath with some activities.  Question if she may have microvascular angina.  Also question if her COPD is contributing to some of her issues with shortness of breath.  She did have a recent chest x-ray that was unremarkable.  Her blood pressure is uncontrolled.  -Continue aspirin, clopidogrel, ezetimibe, isosorbide mononitrate, metoprolol succinate.  -Amlodipine 5 mg daily  -Follow-up with Dr.  Cooper or me in 3 months  3. Chronic obstructive pulmonary disease, unspecified COPD type ([place]) She last saw pulmonology in September.  If her blood pressure improves and her shortness of breath remains, I have recommended that she follow-up with Dr. Lamonte Sakai.  4. Chronic heart failure with  preserved ejection fraction (HCC) Echocardiogram in 2020 with normal EF.  Recent cardiac catheterization demonstrated normal LVEDP and wedge pressure.  She does have mild pulmonary hypertension which is likely related to her COPD.  Volume status overall appears stable.  Continue current management  5. Stage 3a chronic kidney disease (HCC) Recent creatinine stable.  6. Essential (primary) hypertension Blood pressure uncontrolled.  Add amlodipine 5 mg daily as noted.  I have asked her to contact us if she develops significant swelling related to this.  7. Mixed hyperlipidemia 8. Elevated LFTs She is currently off of statin therapy due to elevated LFTs.  Ongoing work-up is pending with primary care.    Dispo:  Return in about 3 months (around 01/11/2021) for Routine Follow Up, w/ Richardson Dopp, PA-C, in person.   Medication Adjustments/Labs and Tests Ordered: Current medicines are reviewed at length with the patient today.  Concerns regarding medicines are outlined above.  Tests Ordered: No orders of the defined types were placed in this encounter.  Medication Changes: Meds ordered this encounter  Medications  . amLODipine (NORVASC) 5 MG tablet    Sig: Take 1 tablet (5 mg total) by mouth daily.    Dispense:  90 tablet    Refill:  3    Signed, Richardson Dopp, PA-C  10/13/2020 10:11 PM    [facility] [address], [place], [place]  [address] Phone: [phone]; Fax: [phone]

## 2020-10-13 NOTE — Patient Instructions (Signed)
Medication Instructions:  Your physician has recommended you make the following change in your medication:   1) Start Amlodipine 5 mg, 1 tablet by mouth once a day  *If you need a refill on your cardiac medications before your next appointment, please call your pharmacy*  Lab Work: None ordered today  Testing/Procedures: None ordered today  Follow-Up: At [facility], you and your health needs are our priority.  As part of our continuing mission to provide you with exceptional heart care, we have created designated Provider Care Teams.  These Care Teams include your primary Cardiologist (physician) and Advanced Practice Providers (APPs -  Physician Assistants and Nurse Practitioners) who all work together to provide you with the care you need, when you need it.  Your next appointment:   3 month(s) on 01/11/21 at 12:15PM   The format for your next appointment:   In Person  Provider:   Tereso Newcomer, PA-C

## 2020-10-15 ENCOUNTER — Telehealth: Payer: Self-pay | Admitting: *Deleted

## 2020-10-15 ENCOUNTER — Ambulatory Visit
Admission: RE | Admit: 2020-10-15 | Discharge: 2020-10-15 | Disposition: A | Discharge status: Home / Self Care | Payer: Medicare Other | Source: Ambulatory Visit | Attending: Family Medicine | Admitting: Family Medicine

## 2020-10-15 ENCOUNTER — Other Ambulatory Visit: Payer: Self-pay | Admitting: Adult Health

## 2020-10-15 DIAGNOSIS — K760 Fatty (change of) liver, not elsewhere classified: Secondary | ICD-10-CM | POA: Diagnosis not present

## 2020-10-15 DIAGNOSIS — R7401 Elevation of levels of liver transaminase levels: Secondary | ICD-10-CM

## 2020-10-15 DIAGNOSIS — R1903 Right lower quadrant abdominal swelling, mass and lump: Secondary | ICD-10-CM

## 2020-10-15 DIAGNOSIS — K838 Other specified diseases of biliary tract: Secondary | ICD-10-CM | POA: Diagnosis not present

## 2020-10-15 NOTE — Telephone Encounter (Signed)
CRITICAL VALUE STICKER  CRITICAL VALUE: abnormal [place]   RECEIVER (on-site recipient of call): Fleet Contras  DATE & TIME NOTIFIED: 10/15/20  MESSENGER (representative from lab): Diane  MD NOTIFIED: Shirline Frees NP  TIME OF NOTIFICATION: 4:14 pm  RESPONSE:   Dr [place] is not in the office.  Please advise.

## 2020-10-15 NOTE — Telephone Encounter (Signed)
Her US showed that she has fatty liver disease. She should work on lifestyle modifications and eat a low carb and no sugar diet to help with this   More importantly, incidental finding showed a nonspecific 21 cm complex cyst and solid mass in the right lower abdomen and pelvis.  Could be multiple things so we need to get a CT scan of the abdomen and pelvis done for further evaluation.  I will put this order in and let Dr. [place] know

## 2020-10-15 NOTE — Telephone Encounter (Signed)
Patient is aware 

## 2020-10-19 NOTE — Telephone Encounter (Signed)
Pt called in to check the status of the order for a CT wanting to have it done as soon as possible.  Pt stated that she is very stressed out not knowing what is going on so that she can get something started before it is too late.  Pt would like to have a call back.

## 2020-10-20 ENCOUNTER — Other Ambulatory Visit: Payer: Self-pay | Admitting: Adult Health

## 2020-10-20 ENCOUNTER — Telehealth: Payer: Self-pay | Admitting: Adult Health

## 2020-10-20 ENCOUNTER — Ambulatory Visit
Admission: RE | Admit: 2020-10-20 | Discharge: 2020-10-20 | Disposition: A | Discharge status: Home / Self Care | Payer: Medicare Other | Source: Ambulatory Visit | Attending: Adult Health | Admitting: Adult Health

## 2020-10-20 DIAGNOSIS — R1903 Right lower quadrant abdominal swelling, mass and lump: Secondary | ICD-10-CM

## 2020-10-20 DIAGNOSIS — K5730 Diverticulosis of large intestine without perforation or abscess without bleeding: Secondary | ICD-10-CM | POA: Diagnosis not present

## 2020-10-20 DIAGNOSIS — N3289 Other specified disorders of bladder: Secondary | ICD-10-CM | POA: Diagnosis not present

## 2020-10-20 DIAGNOSIS — N838 Other noninflammatory disorders of ovary, fallopian tube and broad ligament: Secondary | ICD-10-CM | POA: Diagnosis not present

## 2020-10-20 DIAGNOSIS — N858 Other specified noninflammatory disorders of uterus: Secondary | ICD-10-CM | POA: Diagnosis not present

## 2020-10-20 MED ORDER — IOPAMIDOL (ISOVUE-370) INJECTION 76%
125.0000 mL | Freq: Once | INTRAVENOUS | Status: AC | PRN
  Administered 2020-10-20: 125 mL via INTRAVENOUS

## 2020-10-20 NOTE — Telephone Encounter (Signed)
Updated patient on the results of her CT scan  IMPRESSION: 1. 18.3 x 12.6 x 19.5 cm complex cystic and solid mass lesion in the central pelvis displaces the uterus and bladder inferiorly and essentially fills anterior pelvis. While potentially ovarian, the origin/etiology of the mass is not discernible by CT. No associated ascites. 2. No evidence for lymphadenopathy in the abdomen or pelvis. 3. Left colonic diverticulosis without diverticulitis. 4. Aortic Atherosclerosis (ICD10-I70.0).   We will refer over to GYN oncology for further evaluation

## 2020-10-21 NOTE — Telephone Encounter (Signed)
Noted  

## 2020-10-21 NOTE — Telephone Encounter (Signed)
Update: Reviewing records CT was already done and result addressed. Gyn oncology referral was already placed. Thanks, BJ

## 2020-10-21 NOTE — Telephone Encounter (Addendum)
Abdominal CT was ordered as soon as result was received and stat about a week ago. Because it was an outpt procedure, we were depending on insurance approval, so we did not have much control. CT has been done. Pending gyn evaluation. We could provide phone number so she can check on appt status. If severe abdominal pain presents, sudden onset of nausea or vomiting she needs to go to the ER. Thanks, BJ

## 2020-10-26 ENCOUNTER — Encounter: Payer: Self-pay | Admitting: Gynecologic Oncology

## 2020-10-26 ENCOUNTER — Telehealth: Payer: Self-pay | Admitting: *Deleted

## 2020-10-26 NOTE — Telephone Encounter (Signed)
Called the patient and scheduled a new patient appt for tomorrow 1/11 at 9 am; arrive at 8:30 am with Dr Berline Lopes. Explained the policy for mask and visitors. Patient given the address and phone number for the clinic

## 2020-10-27 ENCOUNTER — Inpatient Hospital Stay: Payer: Medicare Other

## 2020-10-27 ENCOUNTER — Inpatient Hospital Stay: Payer: Medicare Other | Attending: Gynecologic Oncology | Admitting: Gynecologic Oncology

## 2020-10-27 ENCOUNTER — Other Ambulatory Visit: Payer: Self-pay

## 2020-10-27 ENCOUNTER — Encounter: Payer: Self-pay | Admitting: Gynecologic Oncology

## 2020-10-27 ENCOUNTER — Other Ambulatory Visit: Payer: Self-pay | Admitting: Gynecologic Oncology

## 2020-10-27 VITALS — BP 135/92 | HR 102 | Temp 96.5°F | Resp 18 | Ht 65.0 in | Wt 225.0 lb

## 2020-10-27 DIAGNOSIS — K5730 Diverticulosis of large intestine without perforation or abscess without bleeding: Secondary | ICD-10-CM | POA: Diagnosis not present

## 2020-10-27 DIAGNOSIS — D399 Neoplasm of uncertain behavior of female genital organ, unspecified: Secondary | ICD-10-CM

## 2020-10-27 DIAGNOSIS — K219 Gastro-esophageal reflux disease without esophagitis: Secondary | ICD-10-CM | POA: Insufficient documentation

## 2020-10-27 DIAGNOSIS — K5900 Constipation, unspecified: Secondary | ICD-10-CM | POA: Insufficient documentation

## 2020-10-27 DIAGNOSIS — Z833 Family history of diabetes mellitus: Secondary | ICD-10-CM | POA: Insufficient documentation

## 2020-10-27 DIAGNOSIS — Z79899 Other long term (current) drug therapy: Secondary | ICD-10-CM | POA: Diagnosis not present

## 2020-10-27 DIAGNOSIS — R6881 Early satiety: Secondary | ICD-10-CM | POA: Insufficient documentation

## 2020-10-27 DIAGNOSIS — R110 Nausea: Secondary | ICD-10-CM | POA: Diagnosis not present

## 2020-10-27 DIAGNOSIS — I2510 Atherosclerotic heart disease of native coronary artery without angina pectoris: Secondary | ICD-10-CM | POA: Diagnosis not present

## 2020-10-27 DIAGNOSIS — J449 Chronic obstructive pulmonary disease, unspecified: Secondary | ICD-10-CM | POA: Insufficient documentation

## 2020-10-27 DIAGNOSIS — R1900 Intra-abdominal and pelvic swelling, mass and lump, unspecified site: Secondary | ICD-10-CM | POA: Insufficient documentation

## 2020-10-27 DIAGNOSIS — Z881 Allergy status to other antibiotic agents status: Secondary | ICD-10-CM | POA: Insufficient documentation

## 2020-10-27 DIAGNOSIS — Z882 Allergy status to sulfonamides status: Secondary | ICD-10-CM | POA: Insufficient documentation

## 2020-10-27 DIAGNOSIS — I700 Atherosclerosis of aorta: Secondary | ICD-10-CM | POA: Insufficient documentation

## 2020-10-27 DIAGNOSIS — R971 Elevated cancer antigen 125 [CA 125]: Secondary | ICD-10-CM | POA: Diagnosis not present

## 2020-10-27 DIAGNOSIS — R0602 Shortness of breath: Secondary | ICD-10-CM | POA: Diagnosis not present

## 2020-10-27 DIAGNOSIS — I110 Hypertensive heart disease with heart failure: Secondary | ICD-10-CM | POA: Insufficient documentation

## 2020-10-27 DIAGNOSIS — I5032 Chronic diastolic (congestive) heart failure: Secondary | ICD-10-CM | POA: Diagnosis not present

## 2020-10-27 DIAGNOSIS — I272 Pulmonary hypertension, unspecified: Secondary | ICD-10-CM | POA: Diagnosis not present

## 2020-10-27 DIAGNOSIS — Z800 Family history of malignant neoplasm of digestive organs: Secondary | ICD-10-CM | POA: Diagnosis not present

## 2020-10-27 DIAGNOSIS — I252 Old myocardial infarction: Secondary | ICD-10-CM | POA: Insufficient documentation

## 2020-10-27 LAB — CEA (IN HOUSE-CHCC): CEA (CHCC-In House): 1.78 ng/mL (ref 0.00–5.00)

## 2020-10-27 MED ORDER — SENNOSIDES-DOCUSATE SODIUM 8.6-50 MG PO TABS: 2.0000 | ORAL_TABLET | Freq: Every day | ORAL | 0 refills | Status: DC

## 2020-10-27 MED ORDER — OXYCODONE HCL 5 MG PO TABS: 5.0000 mg | ORAL_TABLET | Freq: Four times a day (QID) | ORAL | 0 refills | Status: DC | PRN

## 2020-10-27 NOTE — Addendum Note (Signed)
Addended by: Joylene John D on: 10/27/2020 07:06 PM   Modules accepted: Orders

## 2020-10-27 NOTE — Progress Notes (Addendum)
GYNECOLOGIC ONCOLOGY NEW PATIENT CONSULTATION   Patient Name: Tracy [name]  Patient Age: 75 y.o. Date of Service: 10/28/19 Referring Provider: [name], [name], MD [address] [place],  [place] [address]   Primary Care Provider: [name], [name], MD Consulting Provider: Jeral Pinch, MD   Assessment/Plan:  Postmenopausal patient with a large complex abdominopelvic mass.  We discussed possible etiologies for this mass, likely arising from one of her ovaries.  Given her postmenopausal status as well as the size and complexity of the mass, we discussed that this is an abnormal finding.  Additionally, I think that someof her symptoms have been caused or at least worsened by this growing pelvic mass.  We discussed that definitive diagnosis is possible only on pathologic review.  I recommend moving forward with surgery for both therapeutic and diagnostic purposes.  I discussed plan for bilateral salpingo-oophorectomy.  At the time of surgery, I would plan to send the mass to pathology for frozen section.  If no borderline tumor or malignancy identified, then no further surgery would be indicated.  If a borderline tumor found, I would recommend total hysterectomy.  Only in the setting of a malignancy but I recommend proceeding with staging to include lymph node sampling, omentectomy, peritoneal biopsies, and any other indicated procedure.  Tumor markers including CEA and CA-125 will be obtained today.  I will release these to the patient in my chart.  Given her significant cardiac and pulmonary history, will need preoperative surgical clearance.  We will also ask for recommendations in terms of perioperative management of her cardiac medications.  We discussed plan for a mini-laparotomy for mass decompression, robotic assisted bilateral salpingo-oophorectomy, excision of left labial lesion, possible total hysterectomy, possible staging. The risks of surgery were discussed in detail and  she understands these to include infection; wound separation; hernia; vaginal cuff separation, injury to adjacent organs such as bowel, bladder, blood vessels, ureters and nerves; bleeding which may require blood transfusion; anesthesia risk; thromboembolic events; possible death; unforeseen complications; possible need for re-exploration; medical complications such as heart attack, stroke, pleural effusion and pneumonia; and, if full lymphadenectomy is performed the risk of lymphedema and lymphocyst. The patient will receive DVT and antibiotic prophylaxis as indicated. She voiced a clear understanding. She had the opportunity to ask questions. Perioperative instructions were reviewed with her. Prescriptions for post-op medications were sent to her pharmacy of choice.  A copy of this note was sent to the patient's referring provider.   65 minutes of total time was spent for this patient encounter, including preparation, face-to-face counseling with the patient and coordination of care, and documentation of the encounter.   Jeral Pinch, MD  Division of Gynecologic Oncology  Department of Obstetrics and Gynecology  [facility]  ___________________________________________  Chief Complaint: Chief Complaint  Patient presents with  . Pelvic mass    History of Present Illness:  Tracy [name] is a 75 y.o. y.o. female who is seen in consultation at the request of [name], Malka So, MD for an evaluation of large abdominopelvic mass.  The patient was seen by her PCP recently for routine follow-up.  Given elevated LFTs, right upper quadrant ultrasound was ordered.  This noted a large complex abdominopelvic mass.  The patient then subsequently underwent CT scan with findings described below.  Patient notes having shortness of breath over the last couple of years.  She initially underwent cardiac catheterization with a second stent placed although her breathing did not  improve.  She was then referred to pulmonology and diagnosed with COPD.  She notes continued worsening of her shortness of breath over the last 6-12 months.  She gets very short of breath with any sort of activity or ambulation, such as going to the store.  She also endorses fatigue, abdominal bloating, early satiety, and nausea.  The nausea has been for the last several weeks and is relieved with diet ginger ale.  She endorses a good appetite.  She has struggled for much of her life with constipation, which seems to have worsened recently.  She eats prunes when she becomes more constipated.  She has a history of both stress and urge urinary incontinence.  She wears a pad all the time.  She notes that her incontinence is worsened with the use of Lasix.  Her medical history is significant for coronary artery disease (status post 2 stent placements), congestive heart failure with preserved ejection fraction, and COPD.  She most recently saw cardiology in December.  She also underwent cardiac catheterization with the following findings:  Right dominant coronary anatomy with no noted obstructive disease in the RCA.  Patent relatively short left main.  Large tortuous and angulated LAD with segmental 50 to 60% proximal to mid narrowing and tandem mid to distal 70% stenoses after the large first diagonal.  The diagonal contains 50% proximal narrowing.  The vessel is tortuous and heavily calcified.  Circumflex contains a long, possibly overlapping stented segment that is widely patent.  The vessel is heavily calcified.  Mid range LVEF estimated at 50% with EDP 14 m mercury.  Mild pulmonary hypertension with mean pressure 24 mmHg.  Capillary wedge pressure mean is 15 mmHg.  Pulmonary vascular resistance is 1.42 Wood units.  The minimal elevation in pressure is suggested to be WHO group 3 or 5.  She was scheduled for a colonoscopy, but this had to be canceled because of snow.  She lives in [place] with her  husband.  She is retired.  PAST MEDICAL HISTORY:  Past Medical History:  Diagnosis Date  . Allergic rhinitis   . CAD (coronary artery disease)    1/19 PCI/DES to [place] for ISR, normal EF.   Marland Kitchen Cervical dysplasia    unsure of procedure, possible "burning" in her late 27s  . CHF (congestive heart failure) ([place])    Echo 06/2019: EF 55-60, elevated LVEDP, normal RV SF, mild MAC, mild MR, trivial TR  . COPD (chronic obstructive pulmonary disease) ([place])   . GERD (gastroesophageal reflux disease)   . Gout   . Headache(784.0)   . Hyperlipidemia   . Hypertension   . Low back pain   . Menopausal syndrome   . Myocardial infarct ([place]) 2007   hx of  . Overactive bladder      PAST SURGICAL HISTORY:  Past Surgical History:  Procedure Laterality Date  . ANGIOPLASTY     stent 2007  . BREAST LUMPECTOMY WITH RADIOACTIVE SEED LOCALIZATION Right 04/28/2020   Procedure: RIGHT BREAST LUMPECTOMY X 2  WITH RADIOACTIVE SEED LOCALIZATION;  Surgeon: Donnie Mesa, MD;  Location: [facility];  Service: General;  Laterality: Right;  LMA  . CARDIAC CATHETERIZATION    . COLONOSCOPY    . CORONARY STENT INTERVENTION N/A 11/06/2017   Procedure: CORONARY STENT INTERVENTION;  Surgeon: [name], MD;  Location: [facility];  Service: Cardiovascular;  Laterality: N/A;  . LEFT HEART CATH AND CORONARY ANGIOGRAPHY N/A 11/06/2017   Procedure: LEFT HEART CATH AND  CORONARY ANGIOGRAPHY;  Surgeon: End, Christopher, MD;  Location: [facility];  Service: Cardiovascular;  Laterality: N/A;  . LEFT HEART CATH AND CORONARY ANGIOGRAPHY N/A 07/24/2019   Procedure: LEFT HEART CATH AND CORONARY ANGIOGRAPHY;  Surgeon: McAlhany, Christopher D, MD;  Location: [facility];  Service: Cardiovascular;  Laterality: N/A;  . RIGHT/LEFT HEART CATH AND CORONARY ANGIOGRAPHY N/A 09/29/2020   Procedure: RIGHT/LEFT HEART CATH AND CORONARY ANGIOGRAPHY;  Surgeon: Smith, Henry W, MD;  Location: [facility];   Service: Cardiovascular;  Laterality: N/A;  . TONSILLECTOMY      OB/GYN HISTORY:  OB History  Gravida Para Term Preterm AB Living  2 2          SAB IAB Ectopic Multiple Live Births               # Outcome Date GA Lbr Len/2nd Weight Sex Delivery Anes PTL Lv  2 Para           1 Para             No LMP recorded. Patient is postmenopausal.  Age at menarche: 16 Age at menopause: Mid 50s Hx of HRT: Yes Hx of STDs: Denies Last pap: 2012, negative History of abnormal pap smears: Yes, patient reports an abnormal Pap in her 40s that was treated with what sounds to be cryotherapy.  She reports normal Pap test after  SCREENING STUDIES:  Last mammogram: 2021  Last colonoscopy: 2009 Last bone mineral density: 2019  MEDICATIONS: Outpatient Encounter Medications as of 10/27/2020  Medication Sig  . acetaminophen (TYLENOL) 500 MG tablet Take 500-1,000 mg by mouth every 6 (six) hours as needed for headache.   . albuterol (PROVENTIL) (2.5 MG/3ML) 0.083% nebulizer solution Take 3 mLs (2.5 mg total) by nebulization every 6 (six) hours as needed for wheezing or shortness of breath.  . allopurinol (ZYLOPRIM) 100 MG tablet Take 1 tablet (100 mg total) by mouth daily.  . amLODipine (NORVASC) 5 MG tablet Take 1 tablet (5 mg total) by mouth daily.  . ANORO ELLIPTA 62.5-25 MCG/INH AEPB TAKE 1 PUFF BY MOUTH EVERY DAY  . APPLE CIDER VINEGAR PO Take 2,400 mg by mouth daily.  . aspirin EC 81 MG tablet Take 81 mg by mouth daily.  . calcium carbonate (CALCIUM 600) 600 MG TABS tablet Take 600 mg by mouth every 14 (fourteen) days.   . cetirizine (ZYRTEC) 10 MG tablet Take 10 mg by mouth daily.  . Cholecalciferol (VITAMIN D3) 50 MCG (2000 UT) TABS Take 4,000 Units by mouth daily.   . clopidogrel (PLAVIX) 75 MG tablet TAKE 1 TABLET (75 MG TOTAL) BY MOUTH DAILY WITH BREAKFAST.  . Cyanocobalamin (VITAMIN B-12) 5000 MCG SUBL Place 5,000 mcg under the tongue daily.   . ezetimibe (ZETIA) 10 MG tablet Take 1 tablet  (10 mg total) by mouth daily.  . Fexofenadine HCl (MUCINEX ALLERGY PO) Take 1 tablet by mouth daily as needed.  . fluticasone (FLONASE) 50 MCG/ACT nasal spray SPRAY 2 SPRAYS INTO EACH NOSTRIL EVERY DAY  . furosemide (LASIX) 40 MG tablet Take 40 mg by mouth every other day.  . isosorbide mononitrate (IMDUR) 30 MG 24 hr tablet Take 1 tablet (30 mg total) by mouth daily.  . losartan (COZAAR) 100 MG tablet TAKE 1 TABLET BY MOUTH EVERY DAY  . metoprolol succinate (TOPROL-XL) 100 MG 24 hr tablet TAKE 1 TABLET (100 MG TOTAL) BY MOUTH DAILY. TAKE WITH OR IMMEDIATELY FOLLOWING A MEAL.  . Misc   Natural Products (TART CHERRY ADVANCED) CAPS Take 1,000 capsules by mouth daily.   . nitroGLYCERIN (NITROSTAT) 0.4 MG SL tablet Place 1 tablet (0.4 mg total) under the tongue every 5 (five) minutes as needed for chest pain.  . pantoprazole (PROTONIX) 20 MG tablet TAKE 1 TABLET BY MOUTH EVERY DAY  . potassium chloride SA (KLOR-CON) 20 MEQ tablet Take 20 mEq by mouth daily.  Marland Kitchen albuterol (VENTOLIN HFA) 108 (90 Base) MCG/ACT inhaler Inhale 2 puffs into the lungs every 4 (four) hours as needed for wheezing. (Patient not taking: Reported on 10/26/2020)  . ALPRAZolam (XANAX) 0.25 MG tablet Take 0.25 mg by mouth 3 times/day as needed-between meals & bedtime for anxiety or sleep. (Patient not taking: Reported on 10/26/2020)   No facility-administered encounter medications on file as of 10/27/2020.    ALLERGIES:  Allergies  Allergen Reactions  . Erythromycin Rash    But isn't certain  . Sulfamethoxazole Rash     FAMILY HISTORY:  Family History  Adopted: Yes  Problem Relation Age of Onset  . Colon cancer Mother 43  . Other Other        patient is adopted  . Diabetes Daughter        type 2  . Stomach cancer Neg Hx     REVIEW OF SYSTEMS:  Pertinent positives as per HPI. Denies appetite changes, fevers, chills, fatigue, unexplained weight changes. Denies hearing loss, neck lumps or masses, mouth sores, ringing in  ears or voice changes. Denies cough or wheezing.   Denies chest pain or palpitations.  Denies blood in stools, diarrhea, nausea, vomiting. Denies pain with intercourse, dysuria, frequency, hematuria. Denies hot flashes, vaginal bleeding or vaginal discharge.   Denies joint pain, back pain or muscle pain/cramps. Denies itching, rash, or wounds. Denies dizziness, headaches, numbness or seizures. Denies swollen lymph nodes or glands, denies easy bruising or bleeding. Denies anxiety, depression, confusion, or decreased concentration.  Physical Exam:  Vital Signs for this encounter:  Blood pressure (!) 135/92, pulse (!) 102, temperature (!) 96.5 F (35.8 C), temperature source Tympanic, resp. rate 18, height _0  (1.651 m), weight 225 lb (102.1 kg), SpO2 97 %. Body mass index is 37.44 kg/m. General: Alert, oriented, no acute distress.  HEENT: Normocephalic, atraumatic. Sclera anicteric.  Chest: Mildly decreased breath sounds at lung bases, otherwise clear to auscultation bilaterally. No wheezes, rhonchi, or rales. Cardiovascular: Regular rate and rhythm, no murmurs, rubs, or gallops.  Abdomen: Obese. Normoactive bowel sounds. Soft, nondistended, nontender to palpation.  Mass appreciated feeling much of the abdomen, more on the right than left side. No palpable fluid wave.  Extremities: Grossly normal range of motion. Warm, well perfused.  1+ edema bilaterally.  Skin: No rashes or lesions.  Lymphatics: No cervical, supraclavicular, or inguinal adenopathy.  GU:  Normal external female genitalia.   1 cm cystic appearing lesion on the mid left labia majora, nontender.  No lesions. No discharge or bleeding.             Bladder/urethra:  No lesions or masses, well supported bladder             Vagina: Moderately atrophic, no masses or lesions.             Cervix: Normal appearing, no lesions.             Uterus: Difficult to appreciate given body habitus, some mobility.             Adnexa: No  fullness appreciated in  the cul-de-sac.  Mass is minimally mobile due to size and weight, appreciated outside of the pelvis best with abdominal hand.  Rectal: No nodularity.  LABORATORY AND RADIOLOGIC DATA:  Outside medical records were reviewed to synthesize the above history, along with the history and physical obtained during the visit.   Lab Results  Component Value Date   WBC 9.7 09/22/2020   HGB 11.9 (L) 09/29/2020   HCT 35.0 (L) 09/29/2020   PLT 255 09/22/2020   GLUCOSE 93 09/22/2020   CHOL 128 03/04/2020   TRIG 180 (H) 03/04/2020   HDL 36 (L) 03/04/2020   LDLDIRECT 111.0 01/05/2015   LDLCALC 62 03/04/2020   ALT 69 (H) 09/16/2020   AST 88 (H) 09/16/2020   NA 141 09/29/2020   K 4.3 09/29/2020   CL 105 09/22/2020   CREATININE 1.27 (H) 09/22/2020   BUN 28 (H) 09/22/2020   CO2 21 09/22/2020   TSH 2.02 01/14/2014   INR 1.0 11/03/2017   MICROALBUR 1.7 06/27/2018   CT A/P on 1/4: 1. 18.3 x 12.6 x 19.5 cm complex cystic and solid mass lesion in the central pelvis displaces the uterus and bladder inferiorly and essentially fills anterior pelvis. While potentially ovarian, the origin/etiology of the mass is not discernible by CT. No associated ascites. 2. No evidence for lymphadenopathy in the abdomen or pelvis. 3. Left colonic diverticulosis without diverticulitis. 4. Aortic Atherosclerosis (ICD10-I70.0).

## 2020-10-27 NOTE — H&P (View-Only) (Signed)
GYNECOLOGIC ONCOLOGY NEW PATIENT CONSULTATION   Patient Name: Tracy [name]  Patient Age: 75 y.o. Date of Service: 10/28/19 Referring Provider: [name], [name], MD [address] [place],  [place] [address]   Primary Care Provider: [name], [name], MD Consulting Provider: Jeral Pinch, MD   Assessment/Plan:  Postmenopausal patient with a large complex abdominopelvic mass.  We discussed possible etiologies for this mass, likely arising from one of her ovaries.  Given her postmenopausal status as well as the size and complexity of the mass, we discussed that this is an abnormal finding.  Additionally, I think that someof her symptoms have been caused or at least worsened by this growing pelvic mass.  We discussed that definitive diagnosis is possible only on pathologic review.  I recommend moving forward with surgery for both therapeutic and diagnostic purposes.  I discussed plan for bilateral salpingo-oophorectomy.  At the time of surgery, I would plan to send the mass to pathology for frozen section.  If no borderline tumor or malignancy identified, then no further surgery would be indicated.  If a borderline tumor found, I would recommend total hysterectomy.  Only in the setting of a malignancy but I recommend proceeding with staging to include lymph node sampling, omentectomy, peritoneal biopsies, and any other indicated procedure.  Tumor markers including CEA and CA-125 will be obtained today.  I will release these to the patient in my chart.  Given her significant cardiac and pulmonary history, will need preoperative surgical clearance.  We will also ask for recommendations in terms of perioperative management of her cardiac medications.  We discussed plan for a mini-laparotomy for mass decompression, robotic assisted bilateral salpingo-oophorectomy, excision of left labial lesion, possible total hysterectomy, possible staging. The risks of surgery were discussed in detail and  she understands these to include infection; wound separation; hernia; vaginal cuff separation, injury to adjacent organs such as bowel, bladder, blood vessels, ureters and nerves; bleeding which may require blood transfusion; anesthesia risk; thromboembolic events; possible death; unforeseen complications; possible need for re-exploration; medical complications such as heart attack, stroke, pleural effusion and pneumonia; and, if full lymphadenectomy is performed the risk of lymphedema and lymphocyst. The patient will receive DVT and antibiotic prophylaxis as indicated. She voiced a clear understanding. She had the opportunity to ask questions. Perioperative instructions were reviewed with her. Prescriptions for post-op medications were sent to her pharmacy of choice.  A copy of this note was sent to the patient's referring provider.   65 minutes of total time was spent for this patient encounter, including preparation, face-to-face counseling with the patient and coordination of care, and documentation of the encounter.   Jeral Pinch, MD  Division of Gynecologic Oncology  Department of Obstetrics and Gynecology  [facility]  ___________________________________________  Chief Complaint: Chief Complaint  Patient presents with  . Pelvic mass    History of Present Illness:  Tracy [name] is a 75 y.o. y.o. female who is seen in consultation at the request of [name], Malka So, MD for an evaluation of large abdominopelvic mass.  The patient was seen by her PCP recently for routine follow-up.  Given elevated LFTs, right upper quadrant ultrasound was ordered.  This noted a large complex abdominopelvic mass.  The patient then subsequently underwent CT scan with findings described below.  Patient notes having shortness of breath over the last couple of years.  She initially underwent cardiac catheterization with a second stent placed although her breathing did not  improve.  She was then referred to pulmonology and diagnosed with COPD.  She notes continued worsening of her shortness of breath over the last 6-12 months.  She gets very short of breath with any sort of activity or ambulation, such as going to the store.  She also endorses fatigue, abdominal bloating, early satiety, and nausea.  The nausea has been for the last several weeks and is relieved with diet ginger ale.  She endorses a good appetite.  She has struggled for much of her life with constipation, which seems to have worsened recently.  She eats prunes when she becomes more constipated.  She has a history of both stress and urge urinary incontinence.  She wears a pad all the time.  She notes that her incontinence is worsened with the use of Lasix.  Her medical history is significant for coronary artery disease (status post 2 stent placements), congestive heart failure with preserved ejection fraction, and COPD.  She most recently saw cardiology in December.  She also underwent cardiac catheterization with the following findings:  Right dominant coronary anatomy with no noted obstructive disease in the RCA.  Patent relatively short left main.  Large tortuous and angulated LAD with segmental 50 to 60% proximal to mid narrowing and tandem mid to distal 70% stenoses after the large first diagonal.  The diagonal contains 50% proximal narrowing.  The vessel is tortuous and heavily calcified.  Circumflex contains a long, possibly overlapping stented segment that is widely patent.  The vessel is heavily calcified.  Mid range LVEF estimated at 50% with EDP 14 m mercury.  Mild pulmonary hypertension with mean pressure 24 mmHg.  Capillary wedge pressure mean is 15 mmHg.  Pulmonary vascular resistance is 1.42 Wood units.  The minimal elevation in pressure is suggested to be WHO group 3 or 5.  She was scheduled for a colonoscopy, but this had to be canceled because of snow.  She lives in [place] with her  husband.  She is retired.  PAST MEDICAL HISTORY:  Past Medical History:  Diagnosis Date  . Allergic rhinitis   . CAD (coronary artery disease)    1/19 PCI/DES to [place] for ISR, normal EF.   Marland Kitchen Cervical dysplasia    unsure of procedure, possible "burning" in her late 27s  . CHF (congestive heart failure) ([place])    Echo 06/2019: EF 55-60, elevated LVEDP, normal RV SF, mild MAC, mild MR, trivial TR  . COPD (chronic obstructive pulmonary disease) ([place])   . GERD (gastroesophageal reflux disease)   . Gout   . Headache(784.0)   . Hyperlipidemia   . Hypertension   . Low back pain   . Menopausal syndrome   . Myocardial infarct ([place]) 2007   hx of  . Overactive bladder      PAST SURGICAL HISTORY:  Past Surgical History:  Procedure Laterality Date  . ANGIOPLASTY     stent 2007  . BREAST LUMPECTOMY WITH RADIOACTIVE SEED LOCALIZATION Right 04/28/2020   Procedure: RIGHT BREAST LUMPECTOMY X 2  WITH RADIOACTIVE SEED LOCALIZATION;  Surgeon: Donnie Mesa, MD;  Location: [facility];  Service: General;  Laterality: Right;  LMA  . CARDIAC CATHETERIZATION    . COLONOSCOPY    . CORONARY STENT INTERVENTION N/A 11/06/2017   Procedure: CORONARY STENT INTERVENTION;  Surgeon: [name], MD;  Location: [facility];  Service: Cardiovascular;  Laterality: N/A;  . LEFT HEART CATH AND CORONARY ANGIOGRAPHY N/A 11/06/2017   Procedure: LEFT HEART CATH AND  CORONARY ANGIOGRAPHY;  Surgeon: [name], MD;  Location: [facility];  Service: Cardiovascular;  Laterality: N/A;  . LEFT HEART CATH AND CORONARY ANGIOGRAPHY N/A 07/24/2019   Procedure: LEFT HEART CATH AND CORONARY ANGIOGRAPHY;  Surgeon: Burnell Blanks, MD;  Location: [facility];  Service: Cardiovascular;  Laterality: N/A;  . RIGHT/LEFT HEART CATH AND CORONARY ANGIOGRAPHY N/A 09/29/2020   Procedure: RIGHT/LEFT HEART CATH AND CORONARY ANGIOGRAPHY;  Surgeon: Belva Crome, MD;  Location: [facility];   Service: Cardiovascular;  Laterality: N/A;  . TONSILLECTOMY      OB/GYN HISTORY:  OB History  Gravida Para Term Preterm AB Living  2 2          SAB IAB Ectopic Multiple Live Births               # Outcome Date GA Lbr Len/2nd Weight Sex Delivery Anes PTL Lv  2 Para           1 Para             No LMP recorded. Patient is postmenopausal.  Age at menarche: 49 Age at menopause: Mid 14s Hx of HRT: Yes Hx of STDs: Denies Last pap: 2012, negative History of abnormal pap smears: Yes, patient reports an abnormal Pap in her 31s that was treated with what sounds to be cryotherapy.  She reports normal Pap test after  SCREENING STUDIES:  Last mammogram: 2021  Last colonoscopy: 2009 Last bone mineral density: 2019  MEDICATIONS: Outpatient Encounter Medications as of 10/27/2020  Medication Sig  . acetaminophen (TYLENOL) 500 MG tablet Take 500-1,000 mg by mouth every 6 (six) hours as needed for headache.   . albuterol (PROVENTIL) (2.5 MG/3ML) 0.083% nebulizer solution Take 3 mLs (2.5 mg total) by nebulization every 6 (six) hours as needed for wheezing or shortness of breath.  . allopurinol (ZYLOPRIM) 100 MG tablet Take 1 tablet (100 mg total) by mouth daily.  Marland Kitchen amLODipine (NORVASC) 5 MG tablet Take 1 tablet (5 mg total) by mouth daily.  Jearl Klinefelter ELLIPTA 62.5-25 MCG/INH AEPB TAKE 1 PUFF BY MOUTH EVERY DAY  . APPLE CIDER VINEGAR PO Take 2,400 mg by mouth daily.  Marland Kitchen aspirin EC 81 MG tablet Take 81 mg by mouth daily.  . calcium carbonate (CALCIUM 600) 600 MG TABS tablet Take 600 mg by mouth every 14 (fourteen) days.   . cetirizine (ZYRTEC) 10 MG tablet Take 10 mg by mouth daily.  . Cholecalciferol (VITAMIN D3) 50 MCG (2000 UT) TABS Take 4,000 Units by mouth daily.   . clopidogrel (PLAVIX) 75 MG tablet TAKE 1 TABLET (75 MG TOTAL) BY MOUTH DAILY WITH BREAKFAST.  Marland Kitchen Cyanocobalamin (VITAMIN B-12) 5000 MCG SUBL Place 5,000 mcg under the tongue daily.   Marland Kitchen ezetimibe (ZETIA) 10 MG tablet Take 1 tablet  (10 mg total) by mouth daily.  Marland Kitchen Fexofenadine HCl (MUCINEX ALLERGY PO) Take 1 tablet by mouth daily as needed.  . fluticasone (FLONASE) 50 MCG/ACT nasal spray SPRAY 2 SPRAYS INTO EACH NOSTRIL EVERY DAY  . furosemide (LASIX) 40 MG tablet Take 40 mg by mouth every other day.  . isosorbide mononitrate (IMDUR) 30 MG 24 hr tablet Take 1 tablet (30 mg total) by mouth daily.  Marland Kitchen losartan (COZAAR) 100 MG tablet TAKE 1 TABLET BY MOUTH EVERY DAY  . metoprolol succinate (TOPROL-XL) 100 MG 24 hr tablet TAKE 1 TABLET (100 MG TOTAL) BY MOUTH DAILY. TAKE WITH OR IMMEDIATELY FOLLOWING A MEAL.  Marland Kitchen Misc  Natural Products (TART CHERRY ADVANCED) CAPS Take 1,000 capsules by mouth daily.   . nitroGLYCERIN (NITROSTAT) 0.4 MG SL tablet Place 1 tablet (0.4 mg total) under the tongue every 5 (five) minutes as needed for chest pain.  . pantoprazole (PROTONIX) 20 MG tablet TAKE 1 TABLET BY MOUTH EVERY DAY  . potassium chloride SA (KLOR-CON) 20 MEQ tablet Take 20 mEq by mouth daily.  Marland Kitchen albuterol (VENTOLIN HFA) 108 (90 Base) MCG/ACT inhaler Inhale 2 puffs into the lungs every 4 (four) hours as needed for wheezing. (Patient not taking: Reported on 10/26/2020)  . ALPRAZolam (XANAX) 0.25 MG tablet Take 0.25 mg by mouth 3 times/day as needed-between meals & bedtime for anxiety or sleep. (Patient not taking: Reported on 10/26/2020)   No facility-administered encounter medications on file as of 10/27/2020.    ALLERGIES:  Allergies  Allergen Reactions  . Erythromycin Rash    But isn't certain  . Sulfamethoxazole Rash     FAMILY HISTORY:  Family History  Adopted: Yes  Problem Relation Age of Onset  . Colon cancer Mother 43  . Other Other        patient is adopted  . Diabetes Daughter        type 2  . Stomach cancer Neg Hx     REVIEW OF SYSTEMS:  Pertinent positives as per HPI. Denies appetite changes, fevers, chills, fatigue, unexplained weight changes. Denies hearing loss, neck lumps or masses, mouth sores, ringing in  ears or voice changes. Denies cough or wheezing.   Denies chest pain or palpitations.  Denies blood in stools, diarrhea, nausea, vomiting. Denies pain with intercourse, dysuria, frequency, hematuria. Denies hot flashes, vaginal bleeding or vaginal discharge.   Denies joint pain, back pain or muscle pain/cramps. Denies itching, rash, or wounds. Denies dizziness, headaches, numbness or seizures. Denies swollen lymph nodes or glands, denies easy bruising or bleeding. Denies anxiety, depression, confusion, or decreased concentration.  Physical Exam:  Vital Signs for this encounter:  Blood pressure (!) 135/92, pulse (!) 102, temperature (!) 96.5 F (35.8 C), temperature source Tympanic, resp. rate 18, height _0  (1.651 m), weight 225 lb (102.1 kg), SpO2 97 %. Body mass index is 37.44 kg/m. General: Alert, oriented, no acute distress.  HEENT: Normocephalic, atraumatic. Sclera anicteric.  Chest: Mildly decreased breath sounds at lung bases, otherwise clear to auscultation bilaterally. No wheezes, rhonchi, or rales. Cardiovascular: Regular rate and rhythm, no murmurs, rubs, or gallops.  Abdomen: Obese. Normoactive bowel sounds. Soft, nondistended, nontender to palpation.  Mass appreciated feeling much of the abdomen, more on the right than left side. No palpable fluid wave.  Extremities: Grossly normal range of motion. Warm, well perfused.  1+ edema bilaterally.  Skin: No rashes or lesions.  Lymphatics: No cervical, supraclavicular, or inguinal adenopathy.  GU:  Normal external female genitalia.   1 cm cystic appearing lesion on the mid left labia majora, nontender.  No lesions. No discharge or bleeding.             Bladder/urethra:  No lesions or masses, well supported bladder             Vagina: Moderately atrophic, no masses or lesions.             Cervix: Normal appearing, no lesions.             Uterus: Difficult to appreciate given body habitus, some mobility.             Adnexa: No  fullness appreciated in  the cul-de-sac.  Mass is minimally mobile due to size and weight, appreciated outside of the pelvis best with abdominal hand.  Rectal: No nodularity.  LABORATORY AND RADIOLOGIC DATA:  Outside medical records were reviewed to synthesize the above history, along with the history and physical obtained during the visit.   Lab Results  Component Value Date   WBC 9.7 09/22/2020   HGB 11.9 (L) 09/29/2020   HCT 35.0 (L) 09/29/2020   PLT 255 09/22/2020   GLUCOSE 93 09/22/2020   CHOL 128 03/04/2020   TRIG 180 (H) 03/04/2020   HDL 36 (L) 03/04/2020   LDLDIRECT 111.0 01/05/2015   LDLCALC 62 03/04/2020   ALT 69 (H) 09/16/2020   AST 88 (H) 09/16/2020   NA 141 09/29/2020   K 4.3 09/29/2020   CL 105 09/22/2020   CREATININE 1.27 (H) 09/22/2020   BUN 28 (H) 09/22/2020   CO2 21 09/22/2020   TSH 2.02 01/14/2014   INR 1.0 11/03/2017   MICROALBUR 1.7 06/27/2018   CT A/P on 1/4: 1. 18.3 x 12.6 x 19.5 cm complex cystic and solid mass lesion in the central pelvis displaces the uterus and bladder inferiorly and essentially fills anterior pelvis. While potentially ovarian, the origin/etiology of the mass is not discernible by CT. No associated ascites. 2. No evidence for lymphadenopathy in the abdomen or pelvis. 3. Left colonic diverticulosis without diverticulitis. 4. Aortic Atherosclerosis (ICD10-I70.0).

## 2020-10-27 NOTE — Patient Instructions (Addendum)
Preparing for your Surgery  WE WILL NEED TO OBTAIN CARDIAC AND PULMONARY CLEARANCE PRIOR TO SURGERY. CARDIOLOGY WILL LET us KNOW WHEN YOU CAN STOP YOUR PLAVIX AS WELL.  Plan for surgery on November 10, 2020 with Dr. Eugene Garnet at [facility]. You will be scheduled for a mini laparotomy for cyst drainage (larger incision on your abdomen to decompress the cyst), robotic assisted laparoscopic bilateral salpingo-oophorectomy (removal of both ovaries and fallopian tubes), labial mass excision, possible hysterectomy (removal of the uterus and cervix) with staging if cancer is identified.   Pre-operative Testing -You will receive a phone call from presurgical testing at [facility] to arrange for a pre-operative appointment, lab appointment, and COVID test. The COVID test normally happens 3 days prior to the surgery and they ask that you self quarantine after the test up until surgery to decrease chance of exposure.  -Bring your insurance card, copy of an advanced directive if applicable, medication list  -At that visit, you will be asked to sign a consent for a possible blood transfusion in case a transfusion becomes necessary during surgery.  The need for a blood transfusion is rare but having consent is a necessary part of your care.     -Do not take supplements such as fish oil (omega 3), red yeast rice, turmeric before your surgery.   Day Before Surgery at Home -You will be asked to take in a light diet the day before surgery. You will be advised you can have clear liquids up until 3 hours before your surgery.    Eat a light diet the day before surgery.  Examples including soups, broths, toast, yogurt, mashed potatoes.  AVOID GAS PRODUCING FOODS. Things to avoid include carbonated beverages (fizzy beverages), raw fruits and raw vegetables, or beans.   If your bowels are filled with gas, your surgeon will have difficulty visualizing your pelvic organs which increases your  surgical risks.  Your role in recovery Your role is to become active as soon as directed by your doctor, while still giving yourself time to heal.  Rest when you feel tired. You will be asked to do the following in order to speed your recovery:  - Cough and breathe deeply. This helps to clear and expand your lungs and can prevent pneumonia after surgery.  - STAY ACTIVE WHEN YOU GET HOME. Do mild physical activity. Walking or moving your legs help your circulation and body functions return to normal. Do not try to get up or walk alone the first time after surgery.   -If you develop swelling on one leg or the other, pain in the back of your leg, redness/warmth in one of your legs, please call the office or go to the Emergency Room to have a doppler to rule out a blood clot. For shortness of breath, chest pain-seek care in the Emergency Room as soon as possible. - Actively manage your pain. Managing your pain lets you move in comfort. We will ask you to rate your pain on a scale of zero to 10. It is your responsibility to tell your doctor or nurse where and how much you hurt so your pain can be treated.  Special Considerations -If you are diabetic, you may be placed on insulin after surgery to have closer control over your blood sugars to promote healing and recovery.  This does not mean that you will be discharged on insulin.  If applicable, your oral antidiabetics will be resumed when you are tolerating  a solid diet.  -Your final pathology results from surgery should be available around one week after surgery and the results will be relayed to you when available.  -Dr. Lahoma Crocker is the surgeon that assists your GYN Oncologist with surgery.  If you end up staying the night, the next day after your surgery you will either see Dr. Denman George, Dr. Berline Lopes, or Dr. Lahoma Crocker.  -FMLA forms can be faxed to [phone] and please allow 5-7 business days for completion.  Pain Management After  Surgery -You will be prescribed your pain medication and bowel regimen medications before surgery so that you can have these available when you are discharged from the hospital. The pain medication is for use ONLY AFTER surgery and a new prescription will not be given.   -Make sure that you have Tylenol at home to use on a regular basis after surgery for pain control.   -Review the attached handout on narcotic use and their risks and side effects.   Bowel Regimen -You will be prescribed Sennakot-S to take nightly to prevent constipation especially if you are taking the narcotic pain medication intermittently.  It is important to prevent constipation and drink adequate amounts of liquids. You can stop taking this medication when you are not taking pain medication and you are back on your normal bowel routine.  Risks of Surgery Risks of surgery are low but include bleeding, infection, damage to surrounding structures, re-operation, blood clots, and very rarely death.   Blood Transfusion Information (For the consent to be signed before surgery)  We will be checking your blood type before surgery so in case of emergencies, we will know what type of blood you would need.                                            WHAT IS A BLOOD TRANSFUSION?  A transfusion is the replacement of blood or some of its parts. Blood is made up of multiple cells which provide different functions.  Red blood cells carry oxygen and are used for blood loss replacement.  White blood cells fight against infection.  Platelets control bleeding.  Plasma helps clot blood.  Other blood products are available for specialized needs, such as hemophilia or other clotting disorders. BEFORE THE TRANSFUSION  Who gives blood for transfusions?   You may be able to donate blood to be used at a later date on yourself (autologous donation).  Relatives can be asked to donate blood. This is generally not any safer than if you have  received blood from a stranger. The same precautions are taken to ensure safety when a relative's blood is donated.  Healthy volunteers who are fully evaluated to make sure their blood is safe. This is blood bank blood. Transfusion therapy is the safest it has ever been in the practice of medicine. Before blood is taken from a donor, a complete history is taken to make sure that person has no history of diseases nor engages in risky social behavior (examples are intravenous drug use or sexual activity with multiple partners). The donor's travel history is screened to minimize risk of transmitting infections, such as malaria. The donated blood is tested for signs of infectious diseases, such as HIV and hepatitis. The blood is then tested to be sure it is compatible with you in order to minimize the chance of a transfusion  reaction. If you or a relative donates blood, this is often done in anticipation of surgery and is not appropriate for emergency situations. It takes many days to process the donated blood. RISKS AND COMPLICATIONS Although transfusion therapy is very safe and saves many lives, the main dangers of transfusion include:   Getting an infectious disease.  Developing a transfusion reaction. This is an allergic reaction to something in the blood you were given. Every precaution is taken to prevent this. The decision to have a blood transfusion has been considered carefully by your caregiver before blood is given. Blood is not given unless the benefits outweigh the risks.  AFTER SURGERY INSTRUCTIONS  Return to work: 4 weeks if applicable  YOU WILL HAVE A WHITE HONEYCOMB DRESSING OVER YOUR LARGER INCISION THAT CAN BE REMOVED AFTER 5 DAYS FROM SURGERY AND YOU DO NOT NEED TO REAPPLY A NEW DRESSING.  Activity: 1. Be up and out of the bed during the day.  Take a nap if needed.  You may walk up steps but be careful and use the hand rail.  Stair climbing will tire you more than you think, you  may need to stop part way and rest.   2. No lifting or straining for 6 weeks over 10 pounds. No pushing, pulling, straining for 6 weeks.  3. No driving for 1 week(s).  Do not drive if you are taking narcotic pain medicine and make sure that your reaction time has returned.   4. You can shower as soon as the next day after surgery. Shower daily.  Use your regular soap and water (not directly on the incision) and pat your incision(s) dry afterwards; don't rub.  No tub baths or submerging your body in water until cleared by your surgeon. If you have the soap that was given to you by pre-surgical testing that was used before surgery, you do not need to use it afterwards because this can irritate your incisions.   5. No sexual activity and nothing in the vagina for 8 weeks.  6. You may experience a small amount of clear drainage from your incisions, which is normal.  If the drainage persists, increases, or changes color please call the office.  7. Do not use creams, lotions, or ointments such as neosporin on your incisions after surgery until advised by your surgeon because they can cause removal of the dermabond glue on your incisions.    8. You may experience vaginal spotting after surgery.  The spotting is normal but if you experience heavy bleeding, call our office.  9. Take Tylenol first for pain and only use narcotic pain medication for severe pain not relieved by the Tylenol.  Monitor your Tylenol intake to a max of 4,000 mg in a 24 hour period. You can alternate these medications after surgery.  Diet: 1. Low sodium Heart Healthy Diet is recommended but you are cleared to resume your normal (before surgery) diet after your procedure.  2. It is safe to use a laxative, such as Miralax or Colace, if you have difficulty moving your bowels. You have been prescribed Sennakot at bedtime every evening to keep bowel movements regular and to prevent constipation.    Wound Care: 1. Keep clean and dry.   Shower daily.  Reasons to call the Doctor:  Fever - Oral temperature greater than 100.4 degrees Fahrenheit  Foul-smelling vaginal discharge  Difficulty urinating  Nausea and vomiting  Increased pain at the site of the incision that is unrelieved with  pain medicine.  Difficulty breathing with or without chest pain  New calf pain especially if only on one side  Sudden, continuing increased vaginal bleeding with or without clots.   Contacts: For questions or concerns you should contact:  Dr. Jeral Pinch at [phone]  Joylene John, NP at [phone]  After Hours: call [phone] and have the GYN Oncologist paged/contacted (after 5 pm or on the weekends).  Messages sent via mychart are for non-urgent matters and are not responded to after hours so for urgent needs, please call the after hours number.

## 2020-10-28 ENCOUNTER — Telehealth: Payer: Self-pay | Admitting: *Deleted

## 2020-10-28 LAB — CA 125: Cancer Antigen (CA) 125: 122 U/mL — ABNORMAL HIGH (ref 0.0–38.1)

## 2020-10-28 NOTE — Telephone Encounter (Signed)
   Primary Cardiologist: Sherren Mocha, MD  Chart reviewed as part of pre-operative protocol coverage. Given past medical history and time since last visit, based on ACC/AHA guidelines, Tracy [name] would be at acceptable risk for the planned procedure without further cardiovascular testing.  Recent cardiac catheterization result was quite reassuring.  The patient was advised that if she develops new symptoms prior to surgery to contact our office to arrange for a follow-up visit, and she verbalized understanding.  Patient may hold the Plavix for 5 to 7 days prior to the procedure and restart as soon as possible afterward at the surgeon's discretion based on the bleeding risk.  Although we would prefer she continue on the aspirin, however if absolutely need to come off aspirin, she can hold that for 7 days as well.  I will route this recommendation to the requesting party via Epic fax function and remove from pre-op pool.  Please call with questions.  [place], [place] 10/28/2020, 6:12 PM

## 2020-10-28 NOTE — Progress Notes (Signed)
DUE TO COVID-19 ONLY ONE VISITOR IS ALLOWED TO COME WITH YOU AND STAY IN THE WAITING ROOM ONLY DURING PRE OP AND PROCEDURE DAY OF SURGERY. THE 1 VISITOR  MAY VISIT WITH YOU AFTER SURGERY IN YOUR PRIVATE ROOM DURING VISITING HOURS ONLY!  YOU NEED TO HAVE A COVID 19 TEST ON__1/21/2022 _____ @_______ , THIS TEST MUST BE DONE BEFORE SURGERY,  COVID TESTING SITE [address] [place] [address], IT IS ON THE RIGHT GOING OUT [address] APPROXIMATELY  2 MINUTES PAST [employer] ON THE RIGHT. ONCE YOUR COVID TEST IS COMPLETED,  PLEASE BEGIN THE QUARANTINE INSTRUCTIONS AS OUTLINED IN YOUR HANDOUT.                Tracy [name]  10/28/2020   Your procedure is scheduled on: 11/10/20   Report to [facility] Main  Entrance   Report to admitting at     0530 AM     Call this number if you have problems the morning of surgery [phone]    Remember: Do not eat food , candy gum or mints :After Midnight. You may have clear liquids from midnight until 0430 am     CLEAR LIQUID DIET   Foods Allowed                                                                       Coffee and tea, regular and decaf                              Plain Jell-O any favor except red or purple                                            Fruit ices (not with fruit pulp)                                      Iced Popsicles                                     Carbonated beverages, regular and diet                                    Cranberry, grape and apple juices Sports drinks like Gatorade Lightly seasoned clear broth or consume(fat free) Sugar, honey syrup   _____________________________________________________________________    BRUSH YOUR TEETH MORNING OF SURGERY AND RINSE YOUR MOUTH OUT, NO CHEWING GUM CANDY OR MINTS.     Take these medicines the morning of surgery with A SIP OF WATER: Nebulizer if needed, Inhalers and bring, allopurinol, Amlodipine, Zyrtec, Imdur, Metoprolol,  Protonix   DO NOT TAKE ANY DIABETIC MEDICATIONS DAY OF YOUR SURGERY  You may not have any metal on your body including hair pins and              piercings  Do not wear jewelry, make-up, lotions, powders or perfumes, deodorant             Do not wear nail polish on your fingernails.  Do not shave  48 hours prior to surgery.              Men may shave face and neck.   Do not bring valuables to the hospital. [name].  Contacts, dentures or bridgework may not be worn into surgery.  Leave suitcase in the car. After surgery it may be brought to your room.     Patients discharged the day of surgery will not be allowed to drive home. IF YOU ARE HAVING SURGERY AND GOING HOME THE SAME DAY, YOU MUST HAVE AN ADULT TO DRIVE YOU HOME AND BE WITH YOU FOR 24 HOURS. YOU MAY GO HOME BY TAXI OR UBER OR ORTHERWISE, BUT AN ADULT MUST ACCOMPANY YOU HOME AND STAY WITH YOU FOR 24 HOURS.  Name and phone number of your driver:  Special Instructions: N/A              Please read over the following fact sheets you were given: _____________________________________________________________________  [facility] - Preparing for Surgery Before surgery, you can play an important role.  Because skin is not sterile, your skin needs to be as free of germs as possible.  You can reduce the number of germs on your skin by washing with CHG (chlorahexidine gluconate) soap before surgery.  CHG is an antiseptic cleaner which kills germs and bonds with the skin to continue killing germs even after washing. Please DO NOT use if you have an allergy to CHG or antibacterial soaps.  If your skin becomes reddened/irritated stop using the CHG and inform your nurse when you arrive at Short Stay. Do not shave (including legs and underarms) for at least 48 hours prior to the first CHG shower.  You may shave your face/neck. Please follow these instructions  carefully:  1.  Shower with CHG Soap the night before surgery and the  morning of Surgery.  2.  If you choose to wash your hair, wash your hair first as usual with your  normal  shampoo.  3.  After you shampoo, rinse your hair and body thoroughly to remove the  shampoo.                           4.  Use CHG as you would any other liquid soap.  You can apply chg directly  to the skin and wash                       Gently with a scrungie or clean washcloth.  5.  Apply the CHG Soap to your body ONLY FROM THE NECK DOWN.   Do not use on face/ open                           Wound or open sores. Avoid contact with eyes, ears mouth and genitals (private parts).  Wash face,  Genitals (private parts) with your normal soap.             6.  Wash thoroughly, paying special attention to the area where your surgery  will be performed.  7.  Thoroughly rinse your body with warm water from the neck down.  8.  DO NOT shower/wash with your normal soap after using and rinsing off  the CHG Soap.                9.  Pat yourself dry with a clean towel.            10.  Wear clean pajamas.            11.  Place clean sheets on your bed the night of your first shower and do not  sleep with pets. Day of Surgery : Do not apply any lotions/deodorants the morning of surgery.  Please wear clean clothes to the hospital/surgery center.  FAILURE TO FOLLOW THESE INSTRUCTIONS MAY RESULT IN THE CANCELLATION OF YOUR SURGERY PATIENT SIGNATURE_________________________________  NURSE SIGNATURE__________________________________  ________________________________________________________________________

## 2020-10-28 NOTE — Progress Notes (Addendum)
Anesthesia Review:  PCP: Cardiologist : DR  Burt Knack- [facility] [place], [place] 10/13/2020  Pulm- Dr Lamonte Sakai- 07/02/20 - [facility]  Chest x-ray :10/05/2020  EKG :09/22/2020  Echo :2020 Stress test:2020 Cardiac Cath : 09/29/2020  Activity level: can do a flight of stairs slowly per pt  Sleep Study/ CPAP : Fasting Blood Sugar :      / Checks Blood Sugar -- times a day:   Blood Thinner/ Instructions /Last Dose: ASA / Instructions/ Last Dose :

## 2020-10-28 NOTE — Telephone Encounter (Signed)
Dr. Burt Knack to review and see if Tracy [name] is able to come off of plavix for 5-7 days prior to surgery.  [name] has a history of CAD, CKD stage III, chronic diastolic heart failure, COPD, hypertension and hyperlipidemia.  Patient recently underwent cardiac catheterization on 09/29/2020 that showed mildly elevated wedge pressure, 30% ostial LAD, 50% proximal LAD, 60 to 70% mid LAD, 40% ostial D1, mild disease in left circumflex and RCA.  Patent stent in left circumflex artery.  Medical therapy was recommended.  Last PCI was in January 2019.  Please forward your response to P CV DIV PREOP

## 2020-10-28 NOTE — Telephone Encounter (Signed)
Yes this is fine she is at acceptably low risk of holding plavix 5-7 days preoperatively. thanks

## 2020-10-28 NOTE — Telephone Encounter (Signed)
   [facility] Pre-operative Risk Assessment    HEARTCARE STAFF: - Please ensure there is not already an duplicate clearance open for this procedure. - Under Visit Info/Reason for Call, type in Other and utilize the format Clearance MM/DD/YY or Clearance TBD. Do not use dashes or single digits. - If request is for dental extraction, please clarify the # of teeth to be extracted.  Request for surgical clearance:  1. What type of surgery is being performed? MINI-LAPAROTOMY FOR CYST DRAINAGE, ROBOTIC ASSISTED BSO, LEFT LABIAL MASS EXCISION, POSSIBLE HYSTERECTOMY    2. When is this surgery scheduled? 11/10/20   3. What type of clearance is required (medical clearance vs. Pharmacy clearance to hold med vs. Both)? MEDICAL  4. Are there any medications that need to be held prior to surgery and how long? PLAVIX   5. Practice name and name of physician performing surgery? [facility]; DR. Belenda Cruise TUCKER   6. What is the office phone number? [phone]   7.   What is the office fax number? [phone]  8.   Anesthesia type (None, local, MAC, general) ? GENERAL   Julaine Hua 10/28/2020, 9:40 AM  _________________________________________________________________   (provider comments below)

## 2020-10-29 ENCOUNTER — Other Ambulatory Visit: Payer: Self-pay

## 2020-10-29 ENCOUNTER — Telehealth: Payer: Self-pay | Admitting: *Deleted

## 2020-10-29 ENCOUNTER — Encounter (HOSPITAL_COMMUNITY)
Admission: RE | Admit: 2020-10-29 | Discharge: 2020-10-29 | Disposition: A | Discharge status: Home / Self Care | Payer: Medicare Other | Source: Ambulatory Visit | Attending: Gynecologic Oncology | Admitting: Gynecologic Oncology

## 2020-10-29 ENCOUNTER — Encounter (HOSPITAL_COMMUNITY): Payer: Self-pay

## 2020-10-29 ENCOUNTER — Telehealth: Payer: Self-pay

## 2020-10-29 DIAGNOSIS — I2510 Atherosclerotic heart disease of native coronary artery without angina pectoris: Secondary | ICD-10-CM | POA: Diagnosis not present

## 2020-10-29 DIAGNOSIS — Z87891 Personal history of nicotine dependence: Secondary | ICD-10-CM | POA: Diagnosis not present

## 2020-10-29 DIAGNOSIS — Z7902 Long term (current) use of antithrombotics/antiplatelets: Secondary | ICD-10-CM | POA: Diagnosis not present

## 2020-10-29 DIAGNOSIS — J449 Chronic obstructive pulmonary disease, unspecified: Secondary | ICD-10-CM | POA: Insufficient documentation

## 2020-10-29 DIAGNOSIS — Z01812 Encounter for preprocedural laboratory examination: Secondary | ICD-10-CM | POA: Insufficient documentation

## 2020-10-29 DIAGNOSIS — I110 Hypertensive heart disease with heart failure: Secondary | ICD-10-CM | POA: Diagnosis not present

## 2020-10-29 DIAGNOSIS — Z7982 Long term (current) use of aspirin: Secondary | ICD-10-CM | POA: Insufficient documentation

## 2020-10-29 DIAGNOSIS — I509 Heart failure, unspecified: Secondary | ICD-10-CM | POA: Diagnosis not present

## 2020-10-29 DIAGNOSIS — Z79899 Other long term (current) drug therapy: Secondary | ICD-10-CM | POA: Diagnosis not present

## 2020-10-29 DIAGNOSIS — R1909 Other intra-abdominal and pelvic swelling, mass and lump: Secondary | ICD-10-CM | POA: Insufficient documentation

## 2020-10-29 HISTORY — DX: Malignant (primary) neoplasm, unspecified: C80.1

## 2020-10-29 HISTORY — DX: Family history of other specified conditions: Z84.89

## 2020-10-29 HISTORY — DX: Other specified postprocedural states: Z98.890

## 2020-10-29 HISTORY — DX: Cardiac murmur, unspecified: R01.1

## 2020-10-29 HISTORY — DX: Dyspnea, unspecified: R06.00

## 2020-10-29 HISTORY — DX: Anxiety disorder, unspecified: F41.9

## 2020-10-29 HISTORY — DX: Bronchitis, not specified as acute or chronic: J40

## 2020-10-29 HISTORY — DX: Other specified postprocedural states: R11.2

## 2020-10-29 HISTORY — DX: Other complications of anesthesia, initial encounter: T88.59XA

## 2020-10-29 HISTORY — DX: Unspecified osteoarthritis, unspecified site: M19.90

## 2020-10-29 LAB — URINALYSIS, ROUTINE W REFLEX MICROSCOPIC
Bilirubin Urine: NEGATIVE
Glucose, UA: NEGATIVE mg/dL
Hgb urine dipstick: NEGATIVE
Ketones, ur: 5 mg/dL — AB
Leukocytes,Ua: NEGATIVE
Nitrite: NEGATIVE
Protein, ur: NEGATIVE mg/dL
Specific Gravity, Urine: 1.024 (ref 1.005–1.030)
pH: 5 (ref 5.0–8.0)

## 2020-10-29 LAB — COMPREHENSIVE METABOLIC PANEL
ALT: 34 U/L (ref 0–44)
AST: 35 U/L (ref 15–41)
Albumin: 3.4 g/dL — ABNORMAL LOW (ref 3.5–5.0)
Alkaline Phosphatase: 93 U/L (ref 38–126)
Anion gap: 12 (ref 5–15)
BUN: 26 mg/dL — ABNORMAL HIGH (ref 8–23)
CO2: 22 mmol/L (ref 22–32)
Calcium: 9.1 mg/dL (ref 8.9–10.3)
Chloride: 105 mmol/L (ref 98–111)
Creatinine, Ser: 1.09 mg/dL — ABNORMAL HIGH (ref 0.44–1.00)
GFR, Estimated: 54 mL/min — ABNORMAL LOW (ref >60)
Glucose, Bld: 98 mg/dL (ref 70–99)
Potassium: 4.4 mmol/L (ref 3.5–5.1)
Sodium: 139 mmol/L (ref 135–145)
Total Bilirubin: 0.3 mg/dL (ref 0.3–1.2)
Total Protein: 7.9 g/dL (ref 6.5–8.1)

## 2020-10-29 LAB — CBC
HCT: 42.7 % (ref 36.0–46.0)
Hemoglobin: 12.7 g/dL (ref 12.0–15.0)
MCH: 26 pg (ref 26.0–34.0)
MCHC: 29.7 g/dL — ABNORMAL LOW (ref 30.0–36.0)
MCV: 87.5 fL (ref 80.0–100.0)
Platelets: 284 10*3/uL (ref 150–400)
RBC: 4.88 MIL/uL (ref 3.87–5.11)
RDW: 16.8 % — ABNORMAL HIGH (ref 11.5–15.5)
WBC: 9.9 10*3/uL (ref 4.0–10.5)
nRBC: 0 % (ref 0.0–0.2)

## 2020-10-29 LAB — TYPE AND SCREEN
ABO/RH(D): A POS
Antibody Screen: NEGATIVE

## 2020-10-29 NOTE — Progress Notes (Signed)
Spoke with Santiago Glad in OB/GYN office at time of preop regarding pt stated labial mass was on right not left.  Consent order to be changed to say labial mass on consent form.  Awaiting new consent order to be placed.

## 2020-10-29 NOTE — Anesthesia Preprocedure Evaluation (Addendum)
Anesthesia Evaluation  Patient identified by MRN, date of birth, ID band  Reviewed: Allergy & Precautions, NPO status , Patient's Chart, lab work & pertinent test results  History of Anesthesia Complications (+) PONV and history of anesthetic complications  Airway Mallampati: II  TM Distance: >3 FB Neck ROM: Full    Dental no notable dental hx. (+) Caps, Teeth Intact   Pulmonary shortness of breath, COPD, former smoker,    Pulmonary exam normal breath sounds clear to auscultation       Cardiovascular hypertension, Pt. on medications + Past MI (in 2007) and +CHF  Normal cardiovascular exam Rhythm:Regular Rate:Normal  09/29/20 Heart cath  Right dominant coronary anatomy with no noted obstructive disease in the RCA.  Patent relatively short left main.  Large tortuous and angulated LAD with segmental 50 to 60% proximal to mid narrowing and tandem mid to distal 70% stenoses after the large first diagonal.  The diagonal contains 50% proximal narrowing.  The vessel is tortuous and heavily calcified.  Circumflex contains a long, possibly overlapping stented segment that is widely patent.  The vessel is heavily calcified.  Mid range LVEF estimated at 50% with EDP 14 m mercury.  Mild pulmonary hypertension with mean pressure 24 mmHg.  Capillary wedge pressure mean is 15 mmHg.  Pulmonary vascular resistance is 1.42 Wood units.  The minimal elevation in pressure is suggested to be WHO group 3 or 5.     Neuro/Psych  Headaches, Anxiety    GI/Hepatic GERD  ,  Endo/Other  negative endocrine ROS  Renal/GU Renal InsufficiencyRenal diseaseK+ 4.4 Cr 1.09   Large abdominopelvic mass negative genitourinary   Musculoskeletal  (+) Arthritis ,   Abdominal (+) + obese,   Peds  Hematology hgb 12.7   Anesthesia Other Findings   Reproductive/Obstetrics                           Anesthesia Physical Anesthesia  Plan  ASA: III  Anesthesia Plan: General   Post-op Pain Management:    Induction: Intravenous  PONV Risk Score and Plan: Treatment may vary due to age or medical condition, Ondansetron, Dexamethasone and Aprepitant  Airway Management Planned: Oral ETT  Additional Equipment: None  Intra-op Plan:   Post-operative Plan: Extubation in OR  Informed Consent: I have reviewed the patients History and Physical, chart, labs and discussed the procedure including the risks, benefits and alternatives for the proposed anesthesia with the patient or authorized representative who has indicated his/her understanding and acceptance.     Dental advisory given  Plan Discussed with: CRNA and Anesthesiologist  Anesthesia Plan Comments: (See PAT note 10/29/2020, Konrad Felix, PA-C)      Anesthesia Quick Evaluation

## 2020-10-29 NOTE — Progress Notes (Signed)
Anesthesia Chart Review   Case: [id number] Date/Time: 11/10/20 0700   Procedures:      XI ROBOTIC ASSISTED BILATERAL SALPINGO OOPHORECTOMY WITH MINI LAPAROTOMY FOR DRAINAGE FIRST, POSSIBLE STAGING INCLUDING TOTAL HYSTERECTOMY (Bilateral ) - MINI LAP FIRST     WIDE EXCISION VULVECTOMY (N/A )   Anesthesia type: General   Pre-op diagnosis: PELVIC MASS, LEFT LABIAL MASS   Location: [facility] 03 / [facility]   Surgeons: Lafonda Mosses, MD      DISCUSSION:75 y.o. former smoker with h/o PONV, HTN, GERD, CAD (PCI 2019), CHF (EF 55-60%), COPD, pelvic mass, left labial mass scheduled for above procedure 11/10/2020 with Dr. Jeral Pinch.   Cardiac catheterization on 09/29/2020 that showed mildly elevated wedge pressure, 30% ostial LAD, 50% proximal LAD, 60 to 70% mid LAD, 40% ostial D1, mild disease in left circumflex and RCA.  Patent stent in left circumflex artery.  Medical therapy was recommended  Per cardiology preoperative risk assessment 10/28/2020, "Chart reviewed as part of pre-operative protocol coverage. Given past medical history and time since last visit, based on ACC/AHA guidelines, Tracy [name] would be at acceptable risk for the planned procedure without further cardiovascular testing.  Recent cardiac catheterization result was quite reassuring.  The patient was advised that if she develops new symptoms prior to surgery to contact our office to arrange for a follow-up visit, and she verbalized understanding.  Patient may hold the Plavix for 5 to 7 days prior to the procedure and restart as soon as possible afterward at the surgeon's discretion based on the bleeding risk.  Although we would prefer she continue on the aspirin, however if absolutely need to come off aspirin, she can hold that for 7 days as well."  Anticipate pt can proceed with planned procedure barring acute status change.   VS: BP (!) 143/84   Pulse (!) 102   Temp 37.2 C (Oral)   Resp 16   Ht _0  (1.651 m)    Wt 102.1 kg   SpO2 98%   BMI 37.44 kg/m   PROVIDERS: [name], [name], MD is PCP   Sherren Mocha, MD is Cardiologist  LABS: Labs reviewed: Acceptable for surgery. (all labs ordered are listed, but only abnormal results are displayed)  Labs Reviewed  CBC - Abnormal; Notable for the following components:      Result Value   MCHC 29.7 (*)    RDW 16.8 (*)    All other components within normal limits  COMPREHENSIVE METABOLIC PANEL - Abnormal; Notable for the following components:   BUN 26 (*)    Creatinine, Ser 1.09 (*)    Albumin 3.4 (*)    GFR, Estimated 54 (*)    All other components within normal limits  URINALYSIS, ROUTINE W REFLEX MICROSCOPIC - Abnormal; Notable for the following components:   APPearance HAZY (*)    Ketones, ur 5 (*)    All other components within normal limits  TYPE AND SCREEN     IMAGES:   EKG: 09/22/2020 Rate 85 bpm  NSR  CV: Cardiac Cath 09/29/2020  Right dominant coronary anatomy with no noted obstructive disease in the RCA.  Patent relatively short left main.  Large tortuous and angulated LAD with segmental 50 to 60% proximal to mid narrowing and tandem mid to distal 70% stenoses after the large first diagonal.  The diagonal contains 50% proximal narrowing.  The vessel is tortuous and heavily calcified.  Circumflex contains a long, possibly overlapping stented segment that is  widely patent.  The vessel is heavily calcified.  Mid range LVEF estimated at 50% with EDP 14 m mercury.  Mild pulmonary hypertension with mean pressure 24 mmHg.  Capillary wedge pressure mean is 15 mmHg.  Pulmonary vascular resistance is 1.42 Wood units.  The minimal elevation in pressure is suggested to be WHO group 3 or 5.   Echo 07/17/2019 IMPRESSIONS    1. Left ventricular ejection fraction, by visual estimation, is 55 to  60%. The left ventricle has normal function. Normal left ventricular size.  There is no left ventricular hypertrophy.  2.  Elevated left ventricular end-diastolic pressure.  3. Left ventricular diastolic Doppler parameters are consistent with  impaired relaxation pattern of LV diastolic filling.  4. Global right ventricle has normal systolic function.The right  ventricular size is normal. No increase in right ventricular wall  thickness.  5. Left atrial size was normal.  6. Right atrial size was normal.  7. Mild mitral annular calcification.  8. The mitral valve is normal in structure. Mild mitral valve  regurgitation. No evidence of mitral stenosis.  9. The tricuspid valve is normal in structure. Tricuspid valve  regurgitation is trivial.  10. The aortic valve is grossly normal Aortic valve regurgitation was not  visualized by color flow Doppler. Structurally normal aortic valve, with  no evidence of sclerosis or stenosis.  11. The pulmonic valve was normal in structure. Pulmonic valve  regurgitation is not visualized by color flow Doppler.  12. Normal pulmonary artery systolic pressure.  13. The inferior vena cava is normal in size with greater than 50%  respiratory variability, suggesting right atrial pressure of 3 mmHg. Past Medical History:  Diagnosis Date  . Allergic rhinitis   . Anxiety   . Arthritis   . Bronchitis   . CAD (coronary artery disease)    1/19 PCI/DES to [facility] for ISR, normal EF.   . Cancer ([place])    hx of precancerous cells in right breast   . Cervical dysplasia    unsure of procedure, possible "burning" in her late 67s  . CHF (congestive heart failure) ([place])    Echo 06/2019: EF 55-60, elevated LVEDP, normal RV SF, mild MAC, mild MR, trivial TR  . Complication of anesthesia   . COPD (chronic obstructive pulmonary disease) ([place])    early   . Dyspnea    with exertion   . Family history of adverse reaction to anesthesia    daughter- problems wiht n/v  . GERD (gastroesophageal reflux disease)   . Gout   . Headache(784.0)   . Heart murmur    hx of years ago   .  Hyperlipidemia   . Hypertension   . Low back pain   . Menopausal syndrome   . Myocardial infarct ([place]) 2007   hx of  . Overactive bladder   . PONV (postoperative nausea and vomiting)     Past Surgical History:  Procedure Laterality Date  . ANGIOPLASTY     stent 2007  . BREAST LUMPECTOMY WITH RADIOACTIVE SEED LOCALIZATION Right 04/28/2020   Procedure: RIGHT BREAST LUMPECTOMY X 2  WITH RADIOACTIVE SEED LOCALIZATION;  Surgeon: Donnie Mesa, MD;  Location: [facility];  Service: General;  Laterality: Right;  LMA  . CARDIAC CATHETERIZATION    . COLONOSCOPY    . CORONARY STENT INTERVENTION N/A 11/06/2017   Procedure: CORONARY STENT INTERVENTION;  Surgeon: [name], MD;  Location: [facility];  Service: Cardiovascular;  Laterality: N/A;  . LEFT HEART  CATH AND CORONARY ANGIOGRAPHY N/A 11/06/2017   Procedure: LEFT HEART CATH AND CORONARY ANGIOGRAPHY;  Surgeon: [name], MD;  Location: [place] [facility] [facility];  Service: Cardiovascular;  Laterality: N/A;  . LEFT HEART CATH AND CORONARY ANGIOGRAPHY N/A 07/24/2019   Procedure: LEFT HEART CATH AND CORONARY ANGIOGRAPHY;  Surgeon: Burnell Blanks, MD;  Location: [place] [facility];  Service: Cardiovascular;  Laterality: N/A;  . RIGHT/LEFT HEART CATH AND CORONARY ANGIOGRAPHY N/A 09/29/2020   Procedure: RIGHT/LEFT HEART CATH AND CORONARY ANGIOGRAPHY;  Surgeon: Belva Crome, MD;  Location: [place] [facility];  Service: Cardiovascular;  Laterality: N/A;  . TONSILLECTOMY      MEDICATIONS: . acetaminophen (TYLENOL) 500 MG tablet  . albuterol (PROVENTIL) (2.5 MG/3ML) 0.083% nebulizer solution  . albuterol (VENTOLIN HFA) 108 (90 Base) MCG/ACT inhaler  . allopurinol (ZYLOPRIM) 100 MG tablet  . ALPRAZolam (XANAX) 0.25 MG tablet  . amLODipine (NORVASC) 5 MG tablet  . ANORO ELLIPTA 62.5-25 MCG/INH AEPB  . APPLE CIDER VINEGAR PO  . ASPERCREME LIDOCAINE EX  . aspirin EC 81 MG tablet  . calcium carbonate (CALCIUM 600)  600 MG TABS tablet  . Camphor-Eucalyptus-Menthol (VICKS VAPORUB EX)  . cetirizine (ZYRTEC) 10 MG tablet  . Cholecalciferol (VITAMIN D3) 50 MCG (2000 UT) TABS  . clopidogrel (PLAVIX) 75 MG tablet  . Cyanocobalamin (VITAMIN B-12) 5000 MCG SUBL  . ezetimibe (ZETIA) 10 MG tablet  . fluticasone (FLONASE) 50 MCG/ACT nasal spray  . furosemide (LASIX) 40 MG tablet  . guaiFENesin (MUCINEX) 600 MG 12 hr tablet  . isosorbide mononitrate (IMDUR) 30 MG 24 hr tablet  . losartan (COZAAR) 100 MG tablet  . Menthol-Methyl Salicylate (SALONPAS PAIN RELIEF PATCH EX)  . metoprolol succinate (TOPROL-XL) 100 MG 24 hr tablet  . Misc Natural Products (TART CHERRY ADVANCED) CAPS  . nitroGLYCERIN (NITROSTAT) 0.4 MG SL tablet  . oxyCODONE (OXY IR/ROXICODONE) 5 MG immediate release tablet  . oxymetazoline (AFRIN) 0.05 % nasal spray  . pantoprazole (PROTONIX) 20 MG tablet  . potassium chloride SA (KLOR-CON) 20 MEQ tablet  . senna-docusate (SENOKOT-S) 8.6-50 MG tablet  . Simethicone (GAS-X PO)   No current facility-administered medications for this encounter.    Konrad Felix, PA-C [facility] [phone]

## 2020-10-29 NOTE — Telephone Encounter (Signed)
Late entry-----faxed medical optimization form to both the patient's cardiology and pulmonary Md  Today received answer from patient's cardiology doctor and faxed to pre admission and placed in chart

## 2020-10-29 NOTE — Telephone Encounter (Signed)
LM for Tracy [name] to call back to discuss the management of Plavix and ASA around surgery on 11-10-20 with Tracy [name]. The recommendation from Heart care is to hold the Plavix 7 days prior to surgery = last dose 11-02-20. She can continue with her ASA 81 mg. Pt had questions regarding the removal of mass in right abdomen. The consent and [place] said left. Discussed scans and surgery sheets and Tracy [name] office note.  The left excision is for the labila which is external lip around the vagina.  This is in addition to the BSO. Pt felt comfortable with discussion. Encouraged her to call if she is still not comfortable with explanation. Told her that she will see Tracy [name] prior to the surgery. Pt appreciative of the time. Time spent in discussion was 35 minutes.

## 2020-11-05 ENCOUNTER — Telehealth: Payer: Self-pay | Admitting: Emergency Medicine

## 2020-11-05 ENCOUNTER — Telehealth: Payer: Self-pay | Admitting: *Deleted

## 2020-11-05 NOTE — Telephone Encounter (Signed)
Michelle from Dr. Charisse March office returned my call. Stated to her our policy that if it has been greater than 60 days since pts have been seen at the office, they are now requiring a surgical clearance visit. Sharyn Lull verbalized understanding. Stated to her that pt is scheduled for visit tomorrow 1/20 at 11:45 with Dr. Lamonte Sakai and Sharyn Lull stated that she will change pt's covid test appt and call pt to let her know of the new covid test appt time. After pt's OV with Dr. Lamonte Sakai, OV notes will need to be faxed to [facility]. Appt notes have been updated with this info. Nothing further needed.

## 2020-11-05 NOTE — Telephone Encounter (Signed)
Fax requested on 1/11 and 1/18. Follow up earlier this week 1/18 for her pulmonary clearance, I was told the office was working on the form. Today the office says that the doctors will not sign clearance forms if the patient has no been seen with in 60 days. The is sending Dr Lamonte Sakai another message to see if her will sign off. Message given to Dr Berline Lopes

## 2020-11-05 NOTE — Telephone Encounter (Signed)
Called and spoke with pt in regards to call received from [place] with Dr. Charisse March office. Stated to pt since she has not been seen since September 2021, we needed to get her scheduled for a surgical clearance visit. Pt verbalized understanding. appt has been scheduled for pt tomorrow 1/21 at 11:45 with Dr. Lamonte Sakai.  Attempted to call Sharyn Lull with Dr. Charisse March office to let her know about surgical clearance appt that was needing to be scheduled for pt which is also going to be interfering with time for covid test. Unable to reach [facility] but left a detailed message for her to return call to office. Will keep encounter open.

## 2020-11-05 NOTE — Telephone Encounter (Signed)
Dr Lamonte Sakai office worked the patient in for an appt tomorrow. Moved patient's COVID test to a later time

## 2020-11-06 ENCOUNTER — Other Ambulatory Visit: Payer: Self-pay

## 2020-11-06 ENCOUNTER — Encounter: Payer: Self-pay | Admitting: Emergency Medicine

## 2020-11-06 ENCOUNTER — Ambulatory Visit (INDEPENDENT_AMBULATORY_CARE_PROVIDER_SITE_OTHER): Payer: Medicare Other | Admitting: Emergency Medicine

## 2020-11-06 ENCOUNTER — Other Ambulatory Visit (HOSPITAL_COMMUNITY)
Admission: RE | Admit: 2020-11-06 | Discharge: 2020-11-06 | Disposition: A | Discharge status: Home / Self Care | Payer: Medicare Other | Source: Ambulatory Visit | Attending: Gynecologic Oncology | Admitting: Gynecologic Oncology

## 2020-11-06 ENCOUNTER — Telehealth: Payer: Self-pay | Admitting: Emergency Medicine

## 2020-11-06 DIAGNOSIS — Z20822 Contact with and (suspected) exposure to covid-19: Secondary | ICD-10-CM | POA: Insufficient documentation

## 2020-11-06 DIAGNOSIS — Z01812 Encounter for preprocedural laboratory examination: Secondary | ICD-10-CM | POA: Insufficient documentation

## 2020-11-06 DIAGNOSIS — J449 Chronic obstructive pulmonary disease, unspecified: Secondary | ICD-10-CM

## 2020-11-06 LAB — SARS CORONAVIRUS 2 (TAT 6-24 HRS): SARS Coronavirus 2: NEGATIVE

## 2020-11-06 NOTE — Telephone Encounter (Signed)
OV notes from 11/06/2020 were faxed to Dr. Berline Lopes for surgical clearance.

## 2020-11-06 NOTE — Progress Notes (Signed)
Subjective:    Patient ID: Tracy [name], female    DOB: June 07, 1946, 75 y.o.   MRN: [id number]  Shortness of Breath Associated symptoms include headaches. Pertinent negatives include no ear pain, fever, leg swelling, rash, rhinorrhea, sore throat, vomiting or wheezing.   ROV 07/02/20 --75 year old woman with hypertension and diastolic dysfunction, CAD restrictive and obstructive lung disease consistent with COPD, allergic rhinitis, GERD.  She was treated for acute flare in late January with doxycycline and prednisone taper. She reports today that since that flare she has been fairly stable. She still has exertional dyspnea with walking. Has to stop to rest. She remains on Anoro. She uses albuterol about 3x a week. Mucous was thicker recently, used some mucinex. She remains on flonase and zyrtec., she has been having trouble with fluid buildup, edema, abdominal fluid. She intermittently increases her lasix to bid sometimes - helps the edema and her breathing.   ROV 11/06/20 --pleasant 74 year old woman with a history of hypertension, diastolic CHF, CAD with combined restrictive and obstructive lung disease consistent with COPD.  She has been managed on Anoro but misses it some days, has albuterol which she uses approximately 2x a week.   She is under evaluation for a complex abdominopelvic mass identified by CT.  She is planning for abdominal surgery, probably BSO, possible TAH.  She presents today for preop surgical risk assessment. She reports that her breathing has been overall stable, still has exertional SOB with walking. Occasional cough, minimal mucous. No wheeze. Her last flare when she needed prednisone was a year ago. COVID vaccine is up to date, flu shot is up to date.   Most recent pulmonary function testing from 05/25/2018 reviewed and are consistent with mixed obstruction and restriction with a moderately reduced FEV1 of 74% predicted.   Review of Systems  Constitutional: Positive  for unexpected weight change. Negative for fever.  HENT: Negative for congestion, dental problem, ear pain, nosebleeds, postnasal drip, rhinorrhea, sinus pressure, sneezing, sore throat and trouble swallowing.   Eyes: Negative for redness and itching.  Respiratory: Positive for cough and shortness of breath. Negative for chest tightness and wheezing.   Cardiovascular: Negative for palpitations and leg swelling.  Gastrointestinal: Negative for nausea and vomiting.  Genitourinary: Negative for dysuria.  Musculoskeletal: Positive for joint swelling.  Skin: Negative for rash.  Neurological: Positive for headaches.  Hematological: Does not bruise/bleed easily.  Psychiatric/Behavioral: Negative for dysphoric mood. The patient is not nervous/anxious.        Objective:   Physical Exam Vitals:   11/06/20 1146  BP: (!) 144/84  Pulse: (!) 108  Temp: 97.9 F (36.6 C)  SpO2: 97%  Weight: 231 lb 12.8 oz (105.1 kg)  Height: 5\' 5"  (1.651 m)   Gen: Pleasant, overwt, in no distress,  normal affect, no cough  ENT: No lesions,  mouth clear,  oropharynx clear, no postnasal drip  Neck: No JVD, no stridor  Lungs: No use of accessory muscles, decreased at bases, no crackles or wheezes.   Cardiovascular: RRR, heart sounds normal, no murmur, 1+ LE peripheral edema, more on the L  Musculoskeletal: No deformities, no cyanosis or clubbing  Neuro: alert, non focal  Skin: Warm, no lesions or rash     Assessment & Plan:  COPD (chronic obstructive pulmonary disease) (HCC) Moderately decreased FEV1 74% predicted due to COPD and also restrictive lung disease.  I suspect that her intra-abdominal mass is at least in part responsible for the restriction superimposed on her obesity.  She is tolerating Anoro, rarely uses albuterol, has not flared in over a year.  Somewhat decreased functional capacity which is multifactorial.  I believe that she is at mild to moderate risk for intra-abdominal surgery,  general anesthesia.  This includes risk for a prolonged hospitalization or prolonged mechanical ventilation.  That being said she is not flaring, has not flared in over a year, appears to be well compensated and on a good medical regimen.  There are no contraindications to proceeding next Tuesday.  I will send this information to Dr. Berline Lopes for review.  Your COPD, weight, history of high blood pressure put you at mild to moderate increased risk for abdominal surgery and general anesthesia.  That being said, your heart and lung disease are well controlled at this time without any evidence of flaring, and there is no contraindication for you to undergo surgery.  We will send this risk assessment to Dr. Charisse March office. Please continue Anoro 1 inhalation once daily for now. Check your medication formulary to see if Stiolto is preferred by her insurance.  If so then we may decide to change you to this since it is a nonpowdered medication Keep albuterol available use 2 puffs if needed for shortness of breath, chest tightness, wheezing. COVID-19 vaccine and flu vaccine are both up-to-date Follow with Dr Lamonte Sakai in 3 months or sooner if you have any problems.  Baltazar Apo, MD, PhD 11/06/2020, 12:20 PM [facility] [phone] or if no answer [phone]

## 2020-11-06 NOTE — Assessment & Plan Note (Signed)
Moderately decreased FEV1 74% predicted due to COPD and also restrictive lung disease.  I suspect that her intra-abdominal mass is at least in part responsible for the restriction superimposed on her obesity.  She is tolerating Anoro, rarely uses albuterol, has not flared in over a year.  Somewhat decreased functional capacity which is multifactorial.  I believe that she is at mild to moderate risk for intra-abdominal surgery, general anesthesia.  This includes risk for a prolonged hospitalization or prolonged mechanical ventilation.  That being said she is not flaring, has not flared in over a year, appears to be well compensated and on a good medical regimen.  There are no contraindications to proceeding next Tuesday.  I will send this information to Dr. Berline Lopes for review.  Your COPD, weight, history of high blood pressure put you at mild to moderate increased risk for abdominal surgery and general anesthesia.  That being said, your heart and lung disease are well controlled at this time without any evidence of flaring, and there is no contraindication for you to undergo surgery.  We will send this risk assessment to Dr. Charisse March office. Please continue Anoro 1 inhalation once daily for now. Check your medication formulary to see if Stiolto is preferred by her insurance.  If so then we may decide to change you to this since it is a nonpowdered medication Keep albuterol available use 2 puffs if needed for shortness of breath, chest tightness, wheezing. COVID-19 vaccine and flu vaccine are both up-to-date Follow with Dr Lamonte Sakai in 3 months or sooner if you have any problems.

## 2020-11-06 NOTE — Patient Instructions (Signed)
Your COPD, weight, history of high blood pressure put you at mild to moderate increased risk for abdominal surgery and general anesthesia.  That being said, your heart and lung disease are well controlled at this time without any evidence of flaring, and there is no contraindication for you to undergo surgery.  We will send this risk assessment to Tracy [name] office. Please continue Anoro 1 inhalation once daily for now. Check your medication formulary to see if Stiolto is preferred by her insurance.  If so then we may decide to change you to this since it is a nonpowdered medication Keep albuterol available use 2 puffs if needed for shortness of breath, chest tightness, wheezing. COVID-19 vaccine and flu vaccine are both up-to-date Follow with Tracy [name] in 3 months or sooner if you have any problems.

## 2020-11-09 ENCOUNTER — Telehealth: Payer: Self-pay

## 2020-11-09 NOTE — Telephone Encounter (Signed)
Reviewed written  pre-op instructions with Ms Berninger.  Pt verbalizes understanding. Confirmed last dose of Plavix was 11-02-20. Pt with a slight red rash on her abdomen. It is not raisied or itchy. She states that she gets this rash on and off from her underwear from her bely fat. She applies [employer] ATB cream to the affected area when it occurs.  Reviewed with Joylene John, NP.  Told Ms Roebuck that the nurse practitioner Lenna Sciara said that the rash would not interfere with surgery. Pt verbalized understanding.

## 2020-11-10 ENCOUNTER — Ambulatory Visit (HOSPITAL_COMMUNITY)
Admission: RE | Admit: 2020-11-10 | Discharge: 2020-11-10 | Disposition: A | Discharge status: Home / Self Care | Payer: Medicare Other | Source: Other Acute Inpatient Hospital | Attending: Gynecologic Oncology | Admitting: Gynecologic Oncology

## 2020-11-10 ENCOUNTER — Encounter (HOSPITAL_COMMUNITY): Payer: Self-pay | Admitting: Gynecologic Oncology

## 2020-11-10 ENCOUNTER — Ambulatory Visit (HOSPITAL_COMMUNITY): Payer: Medicare Other | Admitting: Certified Registered Nurse Anesthetist

## 2020-11-10 ENCOUNTER — Encounter (HOSPITAL_COMMUNITY)
Admission: RE | Disposition: A | Discharge status: Home / Self Care | Payer: Self-pay | Source: Other Acute Inpatient Hospital | Attending: Gynecologic Oncology

## 2020-11-10 ENCOUNTER — Ambulatory Visit (HOSPITAL_COMMUNITY): Payer: Medicare Other | Admitting: Physician Assistant

## 2020-11-10 DIAGNOSIS — Z882 Allergy status to sulfonamides status: Secondary | ICD-10-CM | POA: Diagnosis not present

## 2020-11-10 DIAGNOSIS — Z800 Family history of malignant neoplasm of digestive organs: Secondary | ICD-10-CM | POA: Insufficient documentation

## 2020-11-10 DIAGNOSIS — D270 Benign neoplasm of right ovary: Secondary | ICD-10-CM

## 2020-11-10 DIAGNOSIS — I252 Old myocardial infarction: Secondary | ICD-10-CM | POA: Diagnosis not present

## 2020-11-10 DIAGNOSIS — N9089 Other specified noninflammatory disorders of vulva and perineum: Secondary | ICD-10-CM | POA: Diagnosis not present

## 2020-11-10 DIAGNOSIS — Z79899 Other long term (current) drug therapy: Secondary | ICD-10-CM | POA: Diagnosis not present

## 2020-11-10 DIAGNOSIS — D271 Benign neoplasm of left ovary: Secondary | ICD-10-CM | POA: Diagnosis not present

## 2020-11-10 DIAGNOSIS — D280 Benign neoplasm of vulva: Secondary | ICD-10-CM | POA: Insufficient documentation

## 2020-11-10 DIAGNOSIS — R1900 Intra-abdominal and pelvic swelling, mass and lump, unspecified site: Secondary | ICD-10-CM | POA: Diagnosis not present

## 2020-11-10 DIAGNOSIS — N907 Vulvar cyst: Secondary | ICD-10-CM | POA: Diagnosis present

## 2020-11-10 DIAGNOSIS — E7800 Pure hypercholesterolemia, unspecified: Secondary | ICD-10-CM | POA: Diagnosis not present

## 2020-11-10 DIAGNOSIS — I2510 Atherosclerotic heart disease of native coronary artery without angina pectoris: Secondary | ICD-10-CM | POA: Insufficient documentation

## 2020-11-10 DIAGNOSIS — I272 Pulmonary hypertension, unspecified: Secondary | ICD-10-CM | POA: Diagnosis not present

## 2020-11-10 DIAGNOSIS — J449 Chronic obstructive pulmonary disease, unspecified: Secondary | ICD-10-CM | POA: Diagnosis not present

## 2020-11-10 DIAGNOSIS — Z7982 Long term (current) use of aspirin: Secondary | ICD-10-CM | POA: Diagnosis not present

## 2020-11-10 DIAGNOSIS — Z833 Family history of diabetes mellitus: Secondary | ICD-10-CM | POA: Diagnosis not present

## 2020-11-10 DIAGNOSIS — I110 Hypertensive heart disease with heart failure: Secondary | ICD-10-CM | POA: Insufficient documentation

## 2020-11-10 DIAGNOSIS — I5032 Chronic diastolic (congestive) heart failure: Secondary | ICD-10-CM | POA: Diagnosis not present

## 2020-11-10 DIAGNOSIS — N83202 Unspecified ovarian cyst, left side: Secondary | ICD-10-CM | POA: Diagnosis not present

## 2020-11-10 DIAGNOSIS — Z955 Presence of coronary angioplasty implant and graft: Secondary | ICD-10-CM | POA: Insufficient documentation

## 2020-11-10 DIAGNOSIS — Z881 Allergy status to other antibiotic agents status: Secondary | ICD-10-CM | POA: Insufficient documentation

## 2020-11-10 DIAGNOSIS — N83201 Unspecified ovarian cyst, right side: Secondary | ICD-10-CM | POA: Diagnosis not present

## 2020-11-10 DIAGNOSIS — Z7951 Long term (current) use of inhaled steroids: Secondary | ICD-10-CM | POA: Insufficient documentation

## 2020-11-10 HISTORY — PX: VULVECTOMY: SHX1086

## 2020-11-10 HISTORY — PX: ROBOTIC ASSISTED BILATERAL SALPINGO OOPHERECTOMY: SHX6078

## 2020-11-10 LAB — ABO/RH: ABO/RH(D): A POS

## 2020-11-10 SURGERY — SALPINGO-OOPHORECTOMY, BILATERAL, ROBOT-ASSISTED
Anesthesia: General | Site: Abdomen

## 2020-11-10 MED ORDER — DEXMEDETOMIDINE (PRECEDEX) IN NS 20 MCG/5ML (4 MCG/ML) IV SYRINGE
PREFILLED_SYRINGE | INTRAVENOUS | Status: AC
  Filled 2020-11-10: qty 5

## 2020-11-10 MED ORDER — OXYCODONE HCL 5 MG PO TABS: 5.0000 mg | ORAL_TABLET | ORAL | Status: DC | PRN

## 2020-11-10 MED ORDER — SODIUM CHLORIDE 0.9 % IV SOLN: 250.0000 mL | INTRAVENOUS | Status: DC | PRN

## 2020-11-10 MED ORDER — ACETIC ACID 5 % SOLN: 1.0000 "application " | Freq: Once | Status: DC

## 2020-11-10 MED ORDER — LIDOCAINE HCL (PF) 2 % IJ SOLN: INTRAMUSCULAR | Status: DC | PRN

## 2020-11-10 MED ORDER — APREPITANT 40 MG PO CAPS
40.0000 mg | ORAL_CAPSULE | Freq: Once | ORAL | Status: AC
  Administered 2020-11-10: 40 mg via ORAL
  Filled 2020-11-10: qty 1

## 2020-11-10 MED ORDER — OXYCODONE HCL 5 MG/5ML PO SOLN: 5.0000 mg | Freq: Once | ORAL | Status: AC | PRN

## 2020-11-10 MED ORDER — CEFAZOLIN SODIUM-DEXTROSE 2-4 GM/100ML-% IV SOLN
2.0000 g | INTRAVENOUS | Status: AC
  Administered 2020-11-10: 2 g via INTRAVENOUS
  Filled 2020-11-10: qty 100

## 2020-11-10 MED ORDER — STERILE WATER FOR IRRIGATION IR SOLN
Status: DC | PRN
  Administered 2020-11-10: 1000 mL

## 2020-11-10 MED ORDER — OXYCODONE HCL 5 MG PO TABS
ORAL_TABLET | ORAL | Status: AC
  Administered 2020-11-10: 5 mg via ORAL
  Filled 2020-11-10: qty 1

## 2020-11-10 MED ORDER — DEXAMETHASONE SODIUM PHOSPHATE 10 MG/ML IJ SOLN
INTRAMUSCULAR | Status: DC | PRN
  Administered 2020-11-10: 10 mg via INTRAVENOUS

## 2020-11-10 MED ORDER — ACETAMINOPHEN 650 MG RE SUPP
650.0000 mg | RECTAL | Status: DC | PRN
  Filled 2020-11-10: qty 1

## 2020-11-10 MED ORDER — SODIUM CHLORIDE 0.9% FLUSH: 3.0000 mL | INTRAVENOUS | Status: DC | PRN

## 2020-11-10 MED ORDER — FENTANYL CITRATE (PF) 100 MCG/2ML IJ SOLN
INTRAMUSCULAR | Status: AC
  Filled 2020-11-10: qty 2

## 2020-11-10 MED ORDER — DEXAMETHASONE SODIUM PHOSPHATE 10 MG/ML IJ SOLN
INTRAMUSCULAR | Status: AC
  Filled 2020-11-10: qty 1

## 2020-11-10 MED ORDER — LACTATED RINGERS IV SOLN
INTRAVENOUS | Status: DC | PRN
Start: 2020-11-10 — End: 2020-11-10

## 2020-11-10 MED ORDER — BUPIVACAINE HCL 0.25 % IJ SOLN
INTRAMUSCULAR | Status: AC
  Filled 2020-11-10: qty 1

## 2020-11-10 MED ORDER — LIDOCAINE 2% (20 MG/ML) 5 ML SYRINGE
INTRAMUSCULAR | Status: DC | PRN
  Administered 2020-11-10: 100 mg via INTRAVENOUS

## 2020-11-10 MED ORDER — BUPIVACAINE HCL (PF) 0.25 % IJ SOLN
INTRAMUSCULAR | Status: DC | PRN
  Administered 2020-11-10: 45 mL

## 2020-11-10 MED ORDER — HEPARIN SODIUM (PORCINE) 5000 UNIT/ML IJ SOLN
5000.0000 [IU] | INTRAMUSCULAR | Status: AC
  Administered 2020-11-10: 5000 [IU] via SUBCUTANEOUS
  Filled 2020-11-10: qty 1

## 2020-11-10 MED ORDER — LIDOCAINE HCL (PF) 1 % IJ SOLN
INTRAMUSCULAR | Status: AC
  Filled 2020-11-10: qty 30

## 2020-11-10 MED ORDER — ACETAMINOPHEN 325 MG PO TABS
650.0000 mg | ORAL_TABLET | ORAL | Status: DC | PRN
  Administered 2020-11-10: 650 mg via ORAL

## 2020-11-10 MED ORDER — ONDANSETRON HCL 4 MG/2ML IJ SOLN
INTRAMUSCULAR | Status: DC | PRN
  Administered 2020-11-10: 4 mg via INTRAVENOUS

## 2020-11-10 MED ORDER — ONDANSETRON HCL 4 MG/2ML IJ SOLN: 4.0000 mg | Freq: Once | INTRAMUSCULAR | Status: DC | PRN

## 2020-11-10 MED ORDER — FENTANYL CITRATE (PF) 250 MCG/5ML IJ SOLN
INTRAMUSCULAR | Status: DC | PRN
  Administered 2020-11-10: 75 ug via INTRAVENOUS
  Administered 2020-11-10 (×3): 50 ug via INTRAVENOUS
  Administered 2020-11-10: 75 ug via INTRAVENOUS

## 2020-11-10 MED ORDER — ONDANSETRON HCL 4 MG/2ML IJ SOLN
INTRAMUSCULAR | Status: AC
  Filled 2020-11-10: qty 2

## 2020-11-10 MED ORDER — DEXMEDETOMIDINE (PRECEDEX) IN NS 20 MCG/5ML (4 MCG/ML) IV SYRINGE
PREFILLED_SYRINGE | INTRAVENOUS | Status: DC | PRN
  Administered 2020-11-10: 12 ug via INTRAVENOUS
  Administered 2020-11-10: 8 ug via INTRAVENOUS

## 2020-11-10 MED ORDER — ACETAMINOPHEN 325 MG PO TABS
ORAL_TABLET | ORAL | Status: AC
  Filled 2020-11-10: qty 2

## 2020-11-10 MED ORDER — ROCURONIUM BROMIDE 10 MG/ML (PF) SYRINGE
PREFILLED_SYRINGE | INTRAVENOUS | Status: AC
  Filled 2020-11-10: qty 10

## 2020-11-10 MED ORDER — PHENYLEPHRINE HCL-NACL 10-0.9 MG/250ML-% IV SOLN
INTRAVENOUS | Status: DC | PRN
  Administered 2020-11-10: 25 ug/min via INTRAVENOUS

## 2020-11-10 MED ORDER — LIDOCAINE HCL (PF) 2 % IJ SOLN
INTRAMUSCULAR | Status: DC | PRN
  Administered 2020-11-10: 1 mg/kg/h

## 2020-11-10 MED ORDER — LIDOCAINE HCL 1 % IJ SOLN
INTRAMUSCULAR | Status: DC | PRN
  Administered 2020-11-10: 20 mL

## 2020-11-10 MED ORDER — HYDROMORPHONE HCL 1 MG/ML IJ SOLN
INTRAMUSCULAR | Status: AC
  Administered 2020-11-10: 0.25 mg via INTRAVENOUS
  Filled 2020-11-10: qty 1

## 2020-11-10 MED ORDER — LACTATED RINGERS IV SOLN: INTRAVENOUS | Status: DC

## 2020-11-10 MED ORDER — OXYCODONE HCL 5 MG PO TABS: 5.0000 mg | ORAL_TABLET | Freq: Once | ORAL | Status: AC | PRN

## 2020-11-10 MED ORDER — ACETAMINOPHEN 500 MG PO TABS
1000.0000 mg | ORAL_TABLET | ORAL | Status: AC
  Administered 2020-11-10: 1000 mg via ORAL
  Filled 2020-11-10: qty 2

## 2020-11-10 MED ORDER — ACETIC ACID 5 % SOLN
Status: AC
  Filled 2020-11-10: qty 50

## 2020-11-10 MED ORDER — PROPOFOL 10 MG/ML IV BOLUS
INTRAVENOUS | Status: DC | PRN
  Administered 2020-11-10: 140 mg via INTRAVENOUS

## 2020-11-10 MED ORDER — ROCURONIUM BROMIDE 10 MG/ML (PF) SYRINGE
PREFILLED_SYRINGE | INTRAVENOUS | Status: DC | PRN
  Administered 2020-11-10 (×2): 20 mg via INTRAVENOUS
  Administered 2020-11-10: 60 mg via INTRAVENOUS

## 2020-11-10 MED ORDER — DEXAMETHASONE SODIUM PHOSPHATE 4 MG/ML IJ SOLN: 4.0000 mg | INTRAMUSCULAR | Status: DC

## 2020-11-10 MED ORDER — KETOROLAC TROMETHAMINE 30 MG/ML IJ SOLN
INTRAMUSCULAR | Status: AC
  Filled 2020-11-10: qty 1

## 2020-11-10 MED ORDER — KETAMINE HCL 10 MG/ML IJ SOLN
INTRAMUSCULAR | Status: AC
  Filled 2020-11-10: qty 1

## 2020-11-10 MED ORDER — LIDOCAINE HCL (PF) 2 % IJ SOLN
INTRAMUSCULAR | Status: AC
  Filled 2020-11-10: qty 10

## 2020-11-10 MED ORDER — HYDROMORPHONE HCL 1 MG/ML IJ SOLN
0.2500 mg | INTRAMUSCULAR | Status: DC | PRN
  Administered 2020-11-10 (×3): 0.25 mg via INTRAVENOUS

## 2020-11-10 MED ORDER — SUGAMMADEX SODIUM 200 MG/2ML IV SOLN
INTRAVENOUS | Status: DC | PRN
  Administered 2020-11-10: 200 mg via INTRAVENOUS

## 2020-11-10 MED ORDER — PROPOFOL 10 MG/ML IV BOLUS
INTRAVENOUS | Status: AC
  Filled 2020-11-10: qty 20

## 2020-11-10 MED ORDER — DEXMEDETOMIDINE BOLUS VIA INFUSION
0.0380 ug/kg | Freq: Once | INTRAVENOUS | Status: AC
  Administered 2020-11-10: 3.99 ug via INTRAVENOUS

## 2020-11-10 MED ORDER — SODIUM CHLORIDE 0.9% FLUSH: 3.0000 mL | Freq: Two times a day (BID) | INTRAVENOUS | Status: DC

## 2020-11-10 MED ORDER — MORPHINE SULFATE (PF) 4 MG/ML IV SOLN: 2.0000 mg | INTRAVENOUS | Status: DC | PRN

## 2020-11-10 MED ORDER — GABAPENTIN 100 MG PO CAPS
100.0000 mg | ORAL_CAPSULE | ORAL | Status: AC
  Administered 2020-11-10: 100 mg via ORAL
  Filled 2020-11-10: qty 1

## 2020-11-10 MED ORDER — LACTATED RINGERS IV SOLN
INTRAVENOUS | Status: DC | PRN
  Administered 2020-11-10: 1000 mL

## 2020-11-10 MED ORDER — AMISULPRIDE (ANTIEMETIC) 5 MG/2ML IV SOLN: 10.0000 mg | Freq: Once | INTRAVENOUS | Status: DC | PRN

## 2020-11-10 MED ORDER — KETOROLAC TROMETHAMINE 30 MG/ML IJ SOLN: 15.0000 mg | Freq: Once | INTRAMUSCULAR | Status: DC | PRN

## 2020-11-10 SURGICAL SUPPLY — 75 items
BACTOSHIELD CHG 4% 4OZ (MISCELLANEOUS) ×1
BAG LAPAROSCOPIC 12 15 PORT 16 (BASKET) ×2 IMPLANT
BAG RETRIEVAL 12/15 (BASKET) ×3
BLADE SURG 15 STRL LF DISP TIS (BLADE) ×6 IMPLANT
BLADE SURG 15 STRL SS (BLADE) ×9
BLADE SURG SZ10 CARB STEEL (BLADE) ×3 IMPLANT
CHLORAPREP W/TINT 26 (MISCELLANEOUS) ×3 IMPLANT
CNTNR URN SCR LID CUP LEK RST (MISCELLANEOUS) ×2 IMPLANT
CONT SPEC 4OZ STRL OR WHT (MISCELLANEOUS) ×3
COVER BACK TABLE 60X90IN (DRAPES) ×3 IMPLANT
COVER TIP SHEARS 8 DVNC (MISCELLANEOUS) ×2 IMPLANT
COVER TIP SHEARS 8MM DA VINCI (MISCELLANEOUS) ×3
COVER WAND RF STERILE (DRAPES) IMPLANT
DRAPE ARM DVNC X/XI (DISPOSABLE) ×8 IMPLANT
DRAPE COLUMN DVNC XI (DISPOSABLE) ×2 IMPLANT
DRAPE DA VINCI XI ARM (DISPOSABLE) ×12
DRAPE DA VINCI XI COLUMN (DISPOSABLE) ×3
DRAPE SHEET LG 3/4 BI-LAMINATE (DRAPES) ×6 IMPLANT
DRAPE SURG IRRIG POUCH 19X23 (DRAPES) ×3 IMPLANT
DRSG TELFA 3X8 NADH (GAUZE/BANDAGES/DRESSINGS) ×3 IMPLANT
ELECT PENCIL ROCKER SW 15FT (MISCELLANEOUS) ×3 IMPLANT
ELECT REM PT RETURN 15FT ADLT (MISCELLANEOUS) ×3 IMPLANT
GAUZE 4X4 16PLY RFD (DISPOSABLE) ×6 IMPLANT
GAUZE SPONGE 4X4 12PLY STRL (GAUZE/BANDAGES/DRESSINGS) ×3 IMPLANT
GLOVE SURG ENC MOIS LTX SZ6 (GLOVE) ×12 IMPLANT
GLOVE SURG ENC MOIS LTX SZ6.5 (GLOVE) ×6 IMPLANT
GOWN STRL REUS W/ TWL LRG LVL3 (GOWN DISPOSABLE) ×6 IMPLANT
GOWN STRL REUS W/TWL LRG LVL3 (GOWN DISPOSABLE) ×9
HOLDER FOLEY CATH W/STRAP (MISCELLANEOUS) ×3 IMPLANT
IRRIG SUCT STRYKERFLOW 2 WTIP (MISCELLANEOUS) ×3
IRRIGATION SUCT STRKRFLW 2 WTP (MISCELLANEOUS) ×2 IMPLANT
KIT BASIN OR (CUSTOM PROCEDURE TRAY) ×3 IMPLANT
KIT TURNOVER KIT A (KITS) IMPLANT
MANIPULATOR UTERINE 4.5 ZUMI (MISCELLANEOUS) IMPLANT
NEEDLE HYPO 22GX1.5 SAFETY (NEEDLE) ×3 IMPLANT
NEEDLE SPNL 22GX7 QUINCKE BK (NEEDLE) ×3 IMPLANT
NS IRRIG 1000ML POUR BTL (IV SOLUTION) ×3 IMPLANT
OBTURATOR OPTICAL STANDARD 8MM (TROCAR) ×3
OBTURATOR OPTICAL STND 8 DVNC (TROCAR) ×2
OBTURATOR OPTICALSTD 8 DVNC (TROCAR) ×2 IMPLANT
PACK LITHOTOMY IV (CUSTOM PROCEDURE TRAY) ×3 IMPLANT
PACK ROBOT GYN CUSTOM WL (TRAY / TRAY PROCEDURE) ×3 IMPLANT
PAD POSITIONING PINK XL (MISCELLANEOUS) ×3 IMPLANT
PENCIL SMOKE EVACUATOR (MISCELLANEOUS) IMPLANT
PORT LAP GEL ALEXIS MED 5-9CM (MISCELLANEOUS) ×3 IMPLANT
POUCH SPECIMEN RETRIEVAL 10MM (ENDOMECHANICALS) ×3 IMPLANT
SCOPETTES 8  STERILE (MISCELLANEOUS) ×3
SCOPETTES 8 STERILE (MISCELLANEOUS) ×2 IMPLANT
SCRUB CHG 4% DYNA-HEX 4OZ (MISCELLANEOUS) ×2 IMPLANT
SEAL CANN UNIV 5-8 DVNC XI (MISCELLANEOUS) ×8 IMPLANT
SEAL XI 5MM-8MM UNIVERSAL (MISCELLANEOUS) ×12
SET TRI-LUMEN FLTR TB AIRSEAL (TUBING) IMPLANT
SOLUTION ELECTROLUBE (MISCELLANEOUS) ×3 IMPLANT
SUT MNCRL AB 4-0 PS2 18 (SUTURE) ×3 IMPLANT
SUT PDS AB 1 TP1 96 (SUTURE) ×6 IMPLANT
SUT VIC AB 0 CT1 27 (SUTURE) ×3
SUT VIC AB 0 CT1 27XBRD ANTBC (SUTURE) ×2 IMPLANT
SUT VIC AB 2-0 CT1 27 (SUTURE) ×6
SUT VIC AB 2-0 CT1 27XBRD (SUTURE) ×4 IMPLANT
SUT VIC AB 2-0 SH 27 (SUTURE)
SUT VIC AB 2-0 SH 27X BRD (SUTURE) IMPLANT
SUT VIC AB 3-0 SH 18 (SUTURE) IMPLANT
SUT VIC AB 3-0 SH 27 (SUTURE) ×6
SUT VIC AB 3-0 SH 27XBRD (SUTURE) ×4 IMPLANT
SUT VIC AB 4-0 P2 18 (SUTURE) ×6 IMPLANT
SUT VIC AB 4-0 PS2 27 (SUTURE) ×6 IMPLANT
SYR BULB IRRIG 60ML STRL (SYRINGE) ×6 IMPLANT
SYR CONTROL 10ML LL (SYRINGE) ×3 IMPLANT
TOWEL OR 17X26 10 PK STRL BLUE (TOWEL DISPOSABLE) ×3 IMPLANT
TOWEL OR NON WOVEN STRL DISP B (DISPOSABLE) ×3 IMPLANT
TRAP SPECIMEN MUCUS 40CC (MISCELLANEOUS) IMPLANT
TRAY FOLEY MTR SLVR 16FR STAT (SET/KITS/TRAYS/PACK) ×6 IMPLANT
UNDERPAD 30X36 HEAVY ABSORB (UNDERPADS AND DIAPERS) ×3 IMPLANT
WATER STERILE IRR 1000ML POUR (IV SOLUTION) ×3 IMPLANT
YANKAUER SUCT BULB TIP NO VENT (SUCTIONS) ×3 IMPLANT

## 2020-11-10 NOTE — Anesthesia Procedure Notes (Signed)
Procedure Name: Intubation Date/Time: 11/10/2020 7:40 AM Performed by: Kyung Rudd, CRNA Pre-anesthesia Checklist: Patient identified, Emergency Drugs available, Suction available and Patient being monitored Patient Re-evaluated:Patient Re-evaluated prior to induction Oxygen Delivery Method: Circle system utilized Preoxygenation: Pre-oxygenation with 100% oxygen Induction Type: IV induction Ventilation: Mask ventilation without difficulty Laryngoscope Size: Mac and 3 Grade View: Grade III Tube type: Oral Tube size: 7.0 mm Number of attempts: 1 Airway Equipment and Method: Stylet Placement Confirmation: positive ETCO2 and breath sounds checked- equal and bilateral Secured at: 21 cm Tube secured with: Tape Dental Injury: Teeth and Oropharynx as per pre-operative assessment

## 2020-11-10 NOTE — Interval H&P Note (Signed)
History and Physical Interval Note:  11/10/2020 6:50 AM  Tracy [name]  has presented today for surgery, with the diagnosis of PELVIC MASS, LEFT LABIAL MASS.  The various methods of treatment have been discussed with the patient and family. After consideration of risks, benefits and other options for treatment, the patient has consented to  Procedure(s) with comments: XI ROBOTIC ASSISTED BILATERAL SALPINGO OOPHORECTOMY WITH MINI LAPAROTOMY FOR DRAINAGE FIRST, POSSIBLE STAGING INCLUDING TOTAL HYSTERECTOMY (Bilateral) - MINI LAP FIRST WIDE EXCISION VULVECTOMY (N/A) as a surgical intervention.  The patient's history has been reviewed, patient examined, no change in status, stable for surgery.  I have reviewed the patient's chart and labs.  Questions were answered to the patient's satisfaction.     Lafonda Mosses

## 2020-11-10 NOTE — Op Note (Signed)
OPERATIVE NOTE  Pre-operative Diagnosis: 18cm complex adnexal mass, vulvar cyst likely inclusion  Post-operative Diagnosis: same, benign bilateral ovarian cysts on frozen section   Operation: Robotic-assisted laparoscopic bilateral salpingoophorectomy, mini-laparotomy for contained cyst drainage and delivery of enlarged right ovary, excision of 1cm left vulvar lesion  Surgeon: Jeral Pinch MD  Assistant Surgeon: Lahoma Crocker MD (an MD assistant was necessary for tissue manipulation, management of robotic instrumentation, retraction and positioning due to the complexity of the case and hospital policies).   Anesthesia: GET  Urine Output: 200cc  Operative Findings: On EUA, small mobile uterus with heavy, solid feeling mass sitting out of the pelvis. On intra-abdominal inspection, normal upper abdominal survey. Normal omentum, small and large bowel. Right ovary with loculated 18cm cystic mass, no true obvious solid component. Most cyst components drained had clear fluid. One with chocolate brown fluid raising possibility of old endometrioma. Bilateral tubes normal in appearance. Small vesicular cystic appearance of the left ovary (approximatley 3cm). Normal appearing, 8cm uterus. No intra-abdominal or pelvic evidence of disease.   Estimated Blood Loss:  less than 50 mL      Total IV Fluids: see I&O flowsheet         Specimens: bilateral tubes and ovaries, washings         Complications:  None apparent; patient tolerated the procedure well.         Disposition: PACU - hemodynamically stable.  Procedure Details  The patient was seen in the Holding Room. The risks, benefits, complications, treatment options, and expected outcomes were discussed with the patient.  The patient concurred with the proposed plan, giving informed consent.  The site of surgery properly noted/marked. The patient was identified as Tracy [name] and the procedure verified as a Robotic-assisted bilateral  salpingo oophorectomy with possible staging.   After induction of anesthesia, the patient was draped and prepped in the usual sterile manner. Patient was placed in supine position after anesthesia and draped and prepped in the usual sterile manner as follows: Her arms were tucked to her side with all appropriate precautions.  The shoulders were stabilized with padded shoulder blocks applied to the acromium processes.  The patient was placed in the semi-lithotomy position in [place].  The perineum and vagina were prepped with CholoraPrep. The patient was draped after the CholoraPrep had been allowed to dry for 3 minutes.  A Time Out was held and the above information confirmed.  The urethra was prepped with Betadine. Foley catheter was placed.  A sterile speculum was placed in the vagina.  The cervix was grasped with a single-tooth tenaculum. The cervix was dilated with Kennon Rounds dilators.  The ZUMI uterine manipulator with no colpotomizer ring was placed without difficulty.  OG tube placement was confirmed and to suction.   Next, a 10 mm skin incision was made 1 cm below the subcostal margin in the midclavicular line.  The 5 mm Optiview port and scope was used for direct entry.  Opening pressure was under 10 mm CO2.  The abdomen was insufflated and the findings were noted as above.   At this point and all points during the procedure, the patient's intra-abdominal pressure did not exceed 15 mmHg. Next, an 8 mm skin incision was made superior to the umbilicus and a right and left port were placed about 8 cm lateral to the robot port on the right and left side.  A fourth arm was placed on the right.  The 5 mm assist trocar was exchanged for  a 10-12 mm port. All ports were placed under direct visualization.   Attention was then turned to the supraumbilical incision. With the abdomen still insufflated, the peri-umbilical incision was lengthened to 6-7cm. Monopolar electrocautery was used to transect the  subcutaneous tissue. Fascia and peritoneum were further incised and extended. An alexis retractor was placed. Pelvic washings were collected. The bowels were packed out of the field. Blue towels were placed to contain any spillage. The gallbladder trocar was then used to drain approximately 1300cc of clear fluid. The two sites used to drain the cyst were closed with a figure of eight 0 Vicryl stitch. The alexis was covered with the laparoscopic cap system and the abdomen was reinsufflated.   The patient was placed in steep Trendelenburg.  Bowel was folded away into the upper abdomen.  The robot was docked in the normal manner. The right peritoneum was opened parallel to the IP ligament to open the retroperitoneal space. The ureter was noted to be on the medial leaf of the broad ligament.  The peritoneum above the ureter was incised and stretched and the infundibulopelvic ligament was skeletonized, cauterized and cut. This was performed on the right first. The broad ligament was cauterized and transected to just lateral to the uterus. The fallopian tube and utero-ovarian ligament were then isolated, cauterized and transected. Once freed, the right adnexa was placed in an Endocatch bag. All robotic instruments were removed from the abdomen and the robot was undocked. The laparoscopic cap was removed and the Endocatch bag was delivered through the incision. The mass was removed in a contained manner after further drainage of several cyst compartments. The adnexa was sent for frozen section.  The laparoscopic cap was replaced, the abdomen reinsufflated, and the robot redocked. Instruments were inserted under direct visualization. The left peritoneum was opened parallel to the IP ligament to open the retroperitoneal space. The ureter was noted to be on the medial leaf of the broad ligament.  The peritoneum above the ureter was incised and stretched and the infundibulopelvic ligament was skeletonized, cauterized and  cut. The broad ligament was cauterized and transected to just lateral to the uterus. The fallopian tube and utero-ovarian ligament were then isolated, cauterized and transected. The adnexa was placed in an Endocatch bag and removed through the assist trocar.  Frozen section returned at this time and the surgery was completed.  Irrigation was used and excellent hemostasis was achieved.  At this point in the procedure was completed.  Robotic instruments were removed under direct visulaization.  The robot was undocked. The fascia at the mini-lap site was closed with running 1 looped PDS tied in the midline. The subcutaneous tissue was irrigated and local anesthesia injected. The subcutaneous tissue was closed with 2-0 Vicryl. The fascia at the 10-12 mm port was closed with 0 Vicryl on a UR-5 needle.  The subcuticular tissue at all sites was closed with 4-0 Vicryl and the skin was closed with 4-0 Monocryl in a subcuticular manner.  Dermabond was applied.    Attention was then turned to the left vulva. A scalpel was used to circumferentially incise the skin around the lesion. Electrocautery was then used to cauterize and transect the lesion from the underlying subcutaneous tissue. The mass was handed off the field. The incision was irrigated and hemostasis achieved. 1% lidocaine was injected for local anesthesia. The incision was closed with 3-0 Vicryl in mattress fashion.  The vagina was swabbed with  minimal bleeding noted.   Foley catheter and  manipulator were removed. All sponge, lap and needle counts were correct x  3.   The patient was transferred to the recovery room in stable condition.  Jeral Pinch, MD

## 2020-11-10 NOTE — Anesthesia Postprocedure Evaluation (Signed)
Anesthesia Post Note  Patient: Tracy [name]  Procedure(s) Performed: XI ROBOTIC ASSISTED BILATERAL SALPINGO OOPHORECTOMY WITH MINI LAPAROTOMY FOR DRAINAGE (Bilateral Abdomen) WIDE EXCISION VULVECTOMY (N/A )     Patient location during evaluation: PACU Anesthesia Type: General Level of consciousness: awake and alert Pain management: pain level controlled Vital Signs Assessment: post-procedure vital signs reviewed and stable Respiratory status: spontaneous breathing, nonlabored ventilation, respiratory function stable and patient connected to nasal cannula oxygen Cardiovascular status: blood pressure returned to baseline and stable Postop Assessment: no apparent nausea or vomiting Anesthetic complications: no   No complications documented.  Last Vitals:  Vitals:   11/10/20 1200 11/10/20 1215  BP: (!) 131/91 102/79  Pulse: 91 86  Resp: 15 15  Temp:    SpO2: 93% 93%    Last Pain:  Vitals:   11/10/20 1145  TempSrc:   PainSc: [place]

## 2020-11-10 NOTE — Discharge Instructions (Signed)
11/10/2020  Activity: 1. Be up and out of the bed during the day.  Take a nap if needed.  You may walk up steps but be careful and use the hand rail.  Stair climbing will tire you more than you think, you may need to stop part way and rest.   2. No lifting or straining for 6 weeks.  3. No driving until you are not taking narcotic pain medicine and can brake safely. For most people this is 5-10 days.  4. Shower daily.  Use soap and water on your incision and pat dry; don't rub.   5. No sexual activity and nothing in the vagina for 6 weeks.  Medications:  - Take ibuprofen and tylenol first line for pain control. Take these regularly (every 6 hours) to decrease the build up of pain.  - If necessary, for severe pain not relieved by ibuprofen, take oxycodone.  - While taking percocet you should take sennakot every night to reduce the likelihood of constipation. If this causes diarrhea, stop its use.  Diet: 1. Low sodium Heart Healthy Diet is recommended.  2. It is safe to use a laxative if you have difficulty moving your bowels.   Wound Care: 1. Keep clean and dry.  Shower daily.  Reasons to call the Doctor:   Fever - Oral temperature greater than 100.4 degrees Fahrenheit  Foul-smelling vaginal discharge  Difficulty urinating  Nausea and vomiting  Increased pain at the site of the incision that is unrelieved with pain medicine.  Difficulty breathing with or without chest pain  New calf pain especially if only on one side  Sudden, continuing increased vaginal bleeding with or without clots.   Follow-up: 1. See Jeral Pinch in 3 weeks.  Contacts: For questions or concerns you should contact:  Dr. Jeral Pinch at [phone] After hours and on week-ends call [phone] and ask to speak to the physician on call for Gynecologic Oncology

## 2020-11-10 NOTE — Transfer of Care (Signed)
Immediate Anesthesia Transfer of Care Note  Patient: Tracy [name]  Procedure(s) Performed: XI ROBOTIC ASSISTED BILATERAL SALPINGO OOPHORECTOMY WITH MINI LAPAROTOMY FOR DRAINAGE (Bilateral Abdomen) WIDE EXCISION VULVECTOMY (N/A )  Patient Location: PACU  Anesthesia Type:General  Level of Consciousness: awake, alert  and oriented  Airway & Oxygen Therapy: Patient Spontanous Breathing and Patient connected to face mask oxygen  Post-op Assessment: Report given to RN, Post -op Vital signs reviewed and stable and Patient moving all extremities X 4  Post vital signs: Reviewed and stable  Last Vitals:  Vitals Value Taken Time  BP 146/103 11/10/20 1048  Temp 36.4 C 11/10/20 1045  Pulse 91 11/10/20 1051  Resp 14 11/10/20 1051  SpO2 94 % 11/10/20 1051  Vitals shown include unvalidated device data.  Last Pain:  Vitals:   11/10/20 0606  TempSrc: Oral         Complications: No complications documented.

## 2020-11-11 ENCOUNTER — Encounter (HOSPITAL_COMMUNITY): Payer: Self-pay | Admitting: Gynecologic Oncology

## 2020-11-11 ENCOUNTER — Telehealth: Payer: Self-pay

## 2020-11-11 LAB — CYTOLOGY - NON PAP

## 2020-11-11 LAB — SURGICAL PATHOLOGY

## 2020-11-11 NOTE — Telephone Encounter (Signed)
Pt called back about 1230 stating that the right upper incision was very sore to touch. no drainag etc. She is using Oxycodone q 6 hrs. Last dose at 0830. told her to take 2 tylenol 500 mg when I spoke with her. She took 2 at ~1030. Pain increases of course when she sits up or getting out of be or standing.  Told her to put ice on it. She was not able to get picture to go through My chart.  Reviewed with Dickie La said she can take Oxycodone 5 mg  q 4-6 prn. She can also apply heat to the area as well.

## 2020-11-11 NOTE — Telephone Encounter (Addendum)
Ms  Olthoff states that she is eating,drinking, and urinating well. Has not passed gas. She begn the Senokot -S 2 tabs this am.  Told her to increase to 2 tabs bid.  If no BM by Friday mid day to add a capful of Miralax bid. Afebrile. Incisions D&I She is using the Oxycodone prn.  Told her to add tylenol 500-100 mg every 6 hours. Pain level <5 when not moving. Pain ~6/10 when getting up from bed. Pt using a pillow to splint abdomen. Told her that she needs to get up and ambulate more today.  She is using the incentive spirometer. Told Ms Suchan that this nurse will check with the NP Melissa to see if her Plavix is to be resumed today as noted. Will call her back with instructions. Pt aware of post op appointments as well as the office number [phone] and after hours number [phone] to call if she has any questions or concerns

## 2020-11-11 NOTE — Telephone Encounter (Addendum)
Ms Mazzeo states that she has ice on the RUA. It is helping some. She is due for another Oxycodone tablet at 2:30 pm. Told her that she can use Oxycodone 5 mg every 4-6 hrs prn and apply heat prn as noted below by Joylene John, NP. Encouraged her to get up and ambulate to help prevent blood clots and help with healing.  Pt verbalized understanding.

## 2020-11-11 NOTE — Telephone Encounter (Signed)
Told Tracy [name] that Dr. Berline Lopes wants her to hold the Plavix for 5 days after the surgery. She can resume the Plavix on Monday 11-16-20.  She can continue the ASA 81 mg. Pt verbalized understanding.

## 2020-11-12 ENCOUNTER — Telehealth: Payer: Self-pay | Admitting: Gynecologic Oncology

## 2020-11-12 NOTE — Telephone Encounter (Signed)
Called patient and review pathology, happy with this news. Having abdominal pain, no bowel function. Discussed increasing bowel regimen, water intake, and ambulating more. Tolerating PO without N/V. Patient aware to call clinic with any issues, precautions discussed.  Jeral Pinch MD Gynecologic Oncology

## 2020-11-13 ENCOUNTER — Telehealth: Payer: Self-pay

## 2020-11-13 NOTE — Telephone Encounter (Signed)
Pt states that she just drank the other half of the magnesium citrate.  She began vomiting.  She has severe abdominal pain 9/10.  She did pass some gas. Reviewed with Tracy [name].    Told Tracy [name] that Tracy [name] wants her to try a Doculax Suppository.  She needs to take the Miralax bid and the senokot-s bid. She needs to increase ambulation.  Pt states she is moving as much as she can with the pain she is in.She needs to just take in liquids for now.  If she develops vomiting again, she needs to go to the [facility] ED to be evaluated. Pt verbalized understanding.

## 2020-11-13 NOTE — Telephone Encounter (Signed)
Ms Pitstick states that she has had a lot of pain in her lower abdomen and her sides.  She stopped the Oxycodone 5 mg 24 hrs ago as it contributes to constipation. She is using tylenol 500 mg 1-2 tabs q 6 hrs. She has been taking the senokot-S 2 bid. She began with 1 capful of Miralax daily 2 days ago. No BM. She feels she just can push the stool out.  She has trouble with constipation prior to surgery.  Told her to get Mag-Citrate  and take a 1/2 bottle, if no good BM in 3-4 hours take the second half.  If she does not have a good BM call the office. Pt wrote information down and verbalized understanding.

## 2020-11-17 ENCOUNTER — Ambulatory Visit: Payer: Medicare Other | Admitting: Gynecologic Oncology

## 2020-11-18 ENCOUNTER — Telehealth: Payer: Self-pay | Admitting: *Deleted

## 2020-11-18 NOTE — Telephone Encounter (Signed)
Received a medical clearance for the patient. Form fax to pre surgical testing, copy sent to be scanned and one in the chart

## 2020-11-23 ENCOUNTER — Telehealth: Payer: Self-pay

## 2020-11-23 NOTE — Telephone Encounter (Signed)
Ms Parrott states that right above the umbilicus is can touch the area and it is sore to the touch.  She presses the area and she feels a knot.  She feels that the area is protruding but her husband does not think so.  There is a burning sensation there as well.She has ice on her abdomen with minimal effect. She is using tylenol 500 mg 2 tabs every 6 hrs prn. Taking about 6 tabs a day. Pain currently 6/10. She also is experiencing some lower abdominal and groin  pain as well She moved her bowels today. She is passing gas  She has some occasional nausea and drinks ginger ale. She is not eating very much as she does not have an appetite. Her abdomen hurts inside near the area that is painful to touch. when she eats Afebrile Denies chills. No redness noted around incisions or on abdomen.  Will review with Dr. Berline Lopes and Zoila Shutter to see if she needs to come in tomorrow to be evaluated.

## 2020-11-24 ENCOUNTER — Inpatient Hospital Stay: Payer: Medicare Other | Attending: Gynecologic Oncology

## 2020-11-24 ENCOUNTER — Other Ambulatory Visit: Payer: Self-pay

## 2020-11-24 ENCOUNTER — Encounter (HOSPITAL_COMMUNITY): Payer: Self-pay

## 2020-11-24 ENCOUNTER — Encounter: Payer: Self-pay | Admitting: Gynecologic Oncology

## 2020-11-24 ENCOUNTER — Ambulatory Visit (HOSPITAL_COMMUNITY)
Admission: RE | Admit: 2020-11-24 | Discharge: 2020-11-24 | Disposition: A | Discharge status: Home / Self Care | Payer: Medicare Other | Source: Ambulatory Visit | Attending: Gynecologic Oncology | Admitting: Gynecologic Oncology

## 2020-11-24 ENCOUNTER — Inpatient Hospital Stay (HOSPITAL_BASED_OUTPATIENT_CLINIC_OR_DEPARTMENT_OTHER): Payer: Medicare Other | Admitting: Gynecologic Oncology

## 2020-11-24 VITALS — BP 150/80 | HR 110 | Temp 97.0°F | Resp 20 | Wt 211.0 lb

## 2020-11-24 DIAGNOSIS — R141 Gas pain: Secondary | ICD-10-CM | POA: Diagnosis not present

## 2020-11-24 DIAGNOSIS — D280 Benign neoplasm of vulva: Secondary | ICD-10-CM

## 2020-11-24 DIAGNOSIS — D270 Benign neoplasm of right ovary: Secondary | ICD-10-CM

## 2020-11-24 DIAGNOSIS — Z90722 Acquired absence of ovaries, bilateral: Secondary | ICD-10-CM

## 2020-11-24 DIAGNOSIS — M109 Gout, unspecified: Secondary | ICD-10-CM | POA: Diagnosis not present

## 2020-11-24 DIAGNOSIS — Z7982 Long term (current) use of aspirin: Secondary | ICD-10-CM | POA: Insufficient documentation

## 2020-11-24 DIAGNOSIS — R1033 Periumbilical pain: Secondary | ICD-10-CM

## 2020-11-24 DIAGNOSIS — I110 Hypertensive heart disease with heart failure: Secondary | ICD-10-CM | POA: Insufficient documentation

## 2020-11-24 DIAGNOSIS — R109 Unspecified abdominal pain: Secondary | ICD-10-CM | POA: Insufficient documentation

## 2020-11-24 DIAGNOSIS — K219 Gastro-esophageal reflux disease without esophagitis: Secondary | ICD-10-CM | POA: Diagnosis not present

## 2020-11-24 DIAGNOSIS — I509 Heart failure, unspecified: Secondary | ICD-10-CM | POA: Insufficient documentation

## 2020-11-24 DIAGNOSIS — D271 Benign neoplasm of left ovary: Secondary | ICD-10-CM | POA: Insufficient documentation

## 2020-11-24 DIAGNOSIS — I2510 Atherosclerotic heart disease of native coronary artery without angina pectoris: Secondary | ICD-10-CM | POA: Diagnosis not present

## 2020-11-24 DIAGNOSIS — M1990 Unspecified osteoarthritis, unspecified site: Secondary | ICD-10-CM | POA: Insufficient documentation

## 2020-11-24 DIAGNOSIS — I252 Old myocardial infarction: Secondary | ICD-10-CM | POA: Diagnosis not present

## 2020-11-24 DIAGNOSIS — Z87891 Personal history of nicotine dependence: Secondary | ICD-10-CM | POA: Diagnosis not present

## 2020-11-24 DIAGNOSIS — E785 Hyperlipidemia, unspecified: Secondary | ICD-10-CM | POA: Insufficient documentation

## 2020-11-24 DIAGNOSIS — F419 Anxiety disorder, unspecified: Secondary | ICD-10-CM | POA: Diagnosis not present

## 2020-11-24 DIAGNOSIS — Z79899 Other long term (current) drug therapy: Secondary | ICD-10-CM | POA: Insufficient documentation

## 2020-11-24 DIAGNOSIS — J449 Chronic obstructive pulmonary disease, unspecified: Secondary | ICD-10-CM | POA: Diagnosis not present

## 2020-11-24 LAB — BASIC METABOLIC PANEL
Anion gap: 9 (ref 5–15)
BUN: 21 mg/dL (ref 8–23)
CO2: 21 mmol/L — ABNORMAL LOW (ref 22–32)
Calcium: 9.4 mg/dL (ref 8.9–10.3)
Chloride: 109 mmol/L (ref 98–111)
Creatinine, Ser: 1.2 mg/dL — ABNORMAL HIGH (ref 0.44–1.00)
GFR, Estimated: 48 mL/min — ABNORMAL LOW (ref >60)
Glucose, Bld: 121 mg/dL — ABNORMAL HIGH (ref 70–99)
Potassium: 4.3 mmol/L (ref 3.5–5.1)
Sodium: 139 mmol/L (ref 135–145)

## 2020-11-24 LAB — CBC
HCT: 42 % (ref 36.0–46.0)
Hemoglobin: 12.8 g/dL (ref 12.0–15.0)
MCH: 26 pg (ref 26.0–34.0)
MCHC: 30.5 g/dL (ref 30.0–36.0)
MCV: 85.2 fL (ref 80.0–100.0)
Platelets: 264 10*3/uL (ref 150–400)
RBC: 4.93 MIL/uL (ref 3.87–5.11)
RDW: 16.9 % — ABNORMAL HIGH (ref 11.5–15.5)
WBC: 11.5 10*3/uL — ABNORMAL HIGH (ref 4.0–10.5)
nRBC: 0 % (ref 0.0–0.2)

## 2020-11-24 LAB — AMYLASE: Amylase: 104 U/L — ABNORMAL HIGH (ref 28–100)

## 2020-11-24 LAB — LIPASE, BLOOD: Lipase: 80 U/L — ABNORMAL HIGH (ref 11–51)

## 2020-11-24 MED ORDER — IOHEXOL 300 MG/ML  SOLN
100.0000 mL | Freq: Once | INTRAMUSCULAR | Status: AC | PRN
  Administered 2020-11-24: 100 mL via INTRAVENOUS

## 2020-11-24 NOTE — Progress Notes (Signed)
Call the patient with CT results. No answer, left voicemail.  Valarie Cones MD

## 2020-11-24 NOTE — Progress Notes (Signed)
Gynecologic Oncology Return Clinic Visit  11/24/20  Reason for Visit: upper abdominal pain  Treatment History: 1/25: Robotic BSO and removal of left labial mass for 18cm cystic mass, pathology showed serous cystadenoma and benign hidradenoma papilliferum  Interval History: Patient presents today for follow-up secondary to abdominal pain after surgery.  When I spoke to her on the phone last, she was struggling to have return of bowel function.  She notes that overall her bowels have improved but are not back to normal.  She ultimately used a suppository which helped get her bowels moving.  She is having bowel movements almost daily now.  She is having some gas pain for which she is using Gas-X.  Her stool has been mostly formed although she has had some more liquid bowel movements.  She notes overall decreased appetite.  She describes this as feeling hungry but having a "burning" sensation or pain when the food "hits her stomach".  She describes this as pain across her upper abdomen.  She also has noticed a bulge or not that comes and goes above her umbilical incision.  This is tender to palpation.  In terms of her abdominal pain, she is using Tylenol as needed which alleviates her pain for some period of time.  She denies any fevers or chills.  She feels that overall she is drinking a normal amount of fluid for her but has had decreased need to urinate.  Past Medical/Surgical History: Past Medical History:  Diagnosis Date  . Allergic rhinitis   . Anxiety   . Arthritis   . Bronchitis   . CAD (coronary artery disease)    1/19 PCI/DES to [facility] for ISR, normal EF.   . Cancer ([place])    hx of precancerous cells in right breast   . Cervical dysplasia    unsure of procedure, possible "burning" in her late 59s  . CHF (congestive heart failure) ([place])    Echo 06/2019: EF 55-60, elevated LVEDP, normal RV SF, mild MAC, mild MR, trivial TR  . Complication of anesthesia   . COPD (chronic obstructive  pulmonary disease) ([place])    early   . Dyspnea    with exertion   . Family history of adverse reaction to anesthesia    daughter- problems wiht n/v  . GERD (gastroesophageal reflux disease)   . Gout   . Headache(784.0)   . Heart murmur    hx of years ago   . Hyperlipidemia   . Hypertension   . Low back pain   . Menopausal syndrome   . Myocardial infarct ([place]) 2007   hx of  . Overactive bladder   . PONV (postoperative nausea and vomiting)     Past Surgical History:  Procedure Laterality Date  . ANGIOPLASTY     stent 2007  . BREAST LUMPECTOMY WITH RADIOACTIVE SEED LOCALIZATION Right 04/28/2020   Procedure: RIGHT BREAST LUMPECTOMY X 2  WITH RADIOACTIVE SEED LOCALIZATION;  Surgeon: Donnie Mesa, MD;  Location: [facility];  Service: General;  Laterality: Right;  LMA  . CARDIAC CATHETERIZATION    . COLONOSCOPY    . CORONARY STENT INTERVENTION N/A 11/06/2017   Procedure: CORONARY STENT INTERVENTION;  Surgeon: [name], MD;  Location: [facility];  Service: Cardiovascular;  Laterality: N/A;  . LEFT HEART CATH AND CORONARY ANGIOGRAPHY N/A 11/06/2017   Procedure: LEFT HEART CATH AND CORONARY ANGIOGRAPHY;  Surgeon: [name], MD;  Location: [facility];  Service: Cardiovascular;  Laterality: N/A;  .  LEFT HEART CATH AND CORONARY ANGIOGRAPHY N/A 07/24/2019   Procedure: LEFT HEART CATH AND CORONARY ANGIOGRAPHY;  Surgeon: Burnell Blanks, MD;  Location: [facility];  Service: Cardiovascular;  Laterality: N/A;  . RIGHT/LEFT HEART CATH AND CORONARY ANGIOGRAPHY N/A 09/29/2020   Procedure: RIGHT/LEFT HEART CATH AND CORONARY ANGIOGRAPHY;  Surgeon: Belva Crome, MD;  Location: [facility];  Service: Cardiovascular;  Laterality: N/A;  . ROBOTIC ASSISTED BILATERAL SALPINGO OOPHERECTOMY Bilateral 11/10/2020   Procedure: XI ROBOTIC ASSISTED BILATERAL SALPINGO OOPHORECTOMY WITH MINI LAPAROTOMY FOR DRAINAGE;  Surgeon: Lafonda Mosses, MD;   Location: [facility];  Service: Gynecology;  Laterality: Bilateral;  MINI LAP FIRST  . TONSILLECTOMY    . VULVECTOMY N/A 11/10/2020   Procedure: WIDE EXCISION VULVECTOMY;  Surgeon: Lafonda Mosses, MD;  Location: [facility];  Service: Gynecology;  Laterality: N/A;    Family History  Adopted: Yes  Problem Relation Age of Onset  . Colon cancer Mother 66  . Other Other        patient is adopted  . Diabetes Daughter        type 2  . Stomach cancer Neg Hx     Social History   Socioeconomic History  . Marital status: Married    Spouse name: Not on file  . Number of children: Not on file  . Years of education: Not on file  . Highest education level: Not on file  Occupational History  . Occupation: retired  Tobacco Use  . Smoking status: Former Smoker    Packs/day: 0.20    Years: 50.00    Pack years: 10.00    Types: Cigarettes    Quit date: 10/18/2015    Years since quitting: 5.1  . Smokeless tobacco: Never Used  . Tobacco comment: completely quit May of 2018; period of years she did not smoke   Vaping Use  . Vaping Use: Never used  Substance and Sexual Activity  . Alcohol use: Yes    Comment: seldom  . Drug use: No  . Sexual activity: Not Currently  Other Topics Concern  . Not on file  Social History Narrative  . Not on file   Social Determinants of Health   Financial Resource Strain: Not on file  Food Insecurity: Not on file  Transportation Needs: Not on file  Physical Activity: Not on file  Stress: Not on file  Social Connections: Not on file    Current Medications:  Current Outpatient Medications:  .  acetaminophen (TYLENOL) 500 MG tablet, Take 500-1,000 mg by mouth every 6 (six) hours as needed for headache. , Disp: , Rfl:  .  albuterol (PROVENTIL) (2.5 MG/3ML) 0.083% nebulizer solution, Take 3 mLs (2.5 mg total) by nebulization every 6 (six) hours as needed for wheezing or shortness of breath., Disp: 300 mL, Rfl: 5 .  albuterol (VENTOLIN HFA) 108 (90 Base)  MCG/ACT inhaler, Inhale 2 puffs into the lungs every 4 (four) hours as needed for wheezing., Disp: 1 each, Rfl: 3 .  allopurinol (ZYLOPRIM) 100 MG tablet, Take 1 tablet (100 mg total) by mouth daily., Disp: 90 tablet, Rfl: 2 .  ALPRAZolam (XANAX) 0.25 MG tablet, Take 0.25 mg by mouth daily as needed for anxiety., Disp: , Rfl:  .  amLODipine (NORVASC) 5 MG tablet, Take 1 tablet (5 mg total) by mouth daily., Disp: 90 tablet, Rfl: 3 .  ANORO ELLIPTA 62.5-25 MCG/INH AEPB, TAKE 1 PUFF BY MOUTH EVERY DAY (Patient taking differently: Inhale 1 puff into  the lungs daily.), Disp: 60 each, Rfl: 12 .  APPLE CIDER VINEGAR PO, Take 2,400 mg by mouth daily., Disp: , Rfl:  .  ASPERCREME LIDOCAINE EX, Apply 1 application topically daily as needed (pain)., Disp: , Rfl:  .  aspirin EC 81 MG tablet, Take 81 mg by mouth daily., Disp: , Rfl:  .  calcium carbonate (CALCIUM 600) 600 MG TABS tablet, Take 600 mg by mouth See admin instructions. Take 600 mg every 2 months, Disp: , Rfl:  .  Camphor-Eucalyptus-Menthol (VICKS VAPORUB EX), Apply 1 application topically daily as needed (congestion)., Disp: , Rfl:  .  Cholecalciferol (VITAMIN D3) 50 MCG (2000 UT) TABS, Take 4,000 Units by mouth daily. , Disp: , Rfl:  .  clopidogrel (PLAVIX) 75 MG tablet, TAKE 1 TABLET (75 MG TOTAL) BY MOUTH DAILY WITH BREAKFAST., Disp: 90 tablet, Rfl: 3 .  ezetimibe (ZETIA) 10 MG tablet, Take 1 tablet (10 mg total) by mouth daily., Disp: 90 tablet, Rfl: 3 .  fluticasone (FLONASE) 50 MCG/ACT nasal spray, SPRAY 2 SPRAYS INTO EACH NOSTRIL EVERY DAY (Patient taking differently: Place 2 sprays into both nostrils daily.), Disp: 48 mL, Rfl: 1 .  furosemide (LASIX) 40 MG tablet, Take 40 mg by mouth daily as needed for fluid or edema., Disp: , Rfl:  .  guaiFENesin (MUCINEX) 600 MG 12 hr tablet, Take 600 mg by mouth 2 (two) times daily as needed (congestion)., Disp: , Rfl:  .  isosorbide mononitrate (IMDUR) 30 MG 24 hr tablet, Take 1 tablet (30 mg total) by  mouth daily., Disp: 30 tablet, Rfl: 6 .  losartan (COZAAR) 100 MG tablet, TAKE 1 TABLET BY MOUTH EVERY DAY (Patient taking differently: Take 100 mg by mouth daily.), Disp: 90 tablet, Rfl: 3 .  Menthol-Methyl Salicylate (SALONPAS PAIN RELIEF PATCH EX), Apply 1 patch topically daily as needed (pain)., Disp: , Rfl:  .  metoprolol succinate (TOPROL-XL) 100 MG 24 hr tablet, TAKE 1 TABLET (100 MG TOTAL) BY MOUTH DAILY. TAKE WITH OR IMMEDIATELY FOLLOWING A MEAL., Disp: 90 tablet, Rfl: 0 .  Misc Natural Products (TART CHERRY ADVANCED) CAPS, Take 1,000 capsules by mouth daily. , Disp: , Rfl:  .  oxymetazoline (AFRIN) 0.05 % nasal spray, Place 1 spray into both nostrils 2 (two) times daily as needed for congestion., Disp: , Rfl:  .  pantoprazole (PROTONIX) 20 MG tablet, TAKE 1 TABLET BY MOUTH EVERY DAY (Patient taking differently: Take 20 mg by mouth daily.), Disp: 90 tablet, Rfl: 1 .  potassium chloride SA (KLOR-CON) 20 MEQ tablet, Take 20 mEq by mouth daily., Disp: , Rfl:  .  senna-docusate (SENOKOT-S) 8.6-50 MG tablet, Take 2 tablets by mouth at bedtime. For AFTER surgery, do not take if having diarrhea, Disp: 30 tablet, Rfl: 0 .  Simethicone (GAS-X PO), Take 2 tablets by mouth daily as needed (gas)., Disp: , Rfl:  .  cetirizine (ZYRTEC) 10 MG tablet, Take 10 mg by mouth daily., Disp: , Rfl:  .  Cyanocobalamin (VITAMIN B-12) 5000 MCG SUBL, Place 5,000 mcg under the tongue daily. , Disp: , Rfl:  .  nitroGLYCERIN (NITROSTAT) 0.4 MG SL tablet, Place 1 tablet (0.4 mg total) under the tongue every 5 (five) minutes as needed for chest pain. (Patient not taking: Reported on 11/24/2020), Disp: 25 tablet, Rfl: 3  Review of Systems: Pertinent positives include fatigue, constipation, abdominal distention, abdominal pain, difficulty walking. Denies appetite changes, fevers, chills, unexplained weight changes. Denies hearing loss, neck lumps or masses, mouth sores, ringing in  ears or voice changes. Denies cough or  wheezing.  Denies shortness of breath. Denies chest pain or palpitations. Denies leg swelling. Denies blood in stools, diarrhea, nausea, vomiting, or early satiety. Denies pain with intercourse, dysuria, frequency, hematuria or incontinence. Denies hot flashes, pelvic pain, vaginal bleeding or vaginal discharge.   Denies joint pain, back pain or muscle pain/cramps. Denies itching, rash, or wounds. Denies dizziness, headaches, numbness or seizures. Denies swollen lymph nodes or glands, denies easy bruising or bleeding. Denies anxiety, depression, confusion, or decreased concentration.  Physical Exam: BP (!) 150/80 (BP Location: Left Arm, Patient Position: Sitting)   Pulse (!) 110   Temp (!) 97 F (36.1 C) (Tympanic)   Resp 20   Wt 211 lb (95.7 kg)   SpO2 100% Comment: RA  BMI 35.11 kg/m  General: Alert, oriented, no acute distress. HEENT: Normocephalic, atraumatic, sclera anicteric. Chest: Unlabored breathing on room air. Cardiovascular: Mildly tachycardic, normal rhythm, no murmurs. Abdomen: Obese, soft, nontender to palpation except at the upper aspect of the minilaparotomy site at the umbilicus.  There is some fullness that spans 3 to 4 cm in this area.  Normoactive bowel sounds.  No hepatosplenomegaly appreciated.  Dermabond removed from incision sites.  There is some peri-incisional bruising, no erythema or exudate. Extremities: Grossly normal range of motion.  Warm, well perfused.  No edema bilaterally.  Laboratory & Radiologic Studies: None new  Assessment & Plan: Tracy [name] is a 75 y.o. woman who is s/p robotic BSO approximately 2 weeks ago for a large pelvic mass with benign final pathology findings presenting for about a week of upper abdominal pain.  Discussed with the patient my concern given her exam as well as decreased appetite that she may have an incisional hernia.  We discussed that this also could be a seroma or hematoma.  Her description of pain across  her upper abdomen is more concerning for stool burden and I think she still has some degree of constipation.  Clinically, she is not toxic, and I do not think warrants emergent imaging through the ED.  I will plan to get labs today and as long as these are reassuring, then I have ordered a stat CT scan which will hopefully get performed in the next several days.  I gave her precautions that if she were to have persistent nausea or develop emesis, or if she were to have worsening abdominal pain, that she would need to come into the emergency department for evaluation.  Otherwise, I will call her with her imaging results.  28 minutes of total time was spent for this patient encounter, including preparation, face-to-face counseling with the patient and coordination of care, and documentation of the encounter.  Jeral Pinch, MD  Division of Gynecologic Oncology  Department of Obstetrics and Gynecology  [facility]

## 2020-11-24 NOTE — Patient Instructions (Signed)
Overall, you are healing well from surgery.  I will call you today to let you know how your lab work looks.  If your lab work is concerning or you start having severe pain and/or vomiting before your scheduled CT scan, then please come into the emergency department.  Otherwise, I will call you after your CT scan to discuss the results.

## 2020-11-25 ENCOUNTER — Telehealth: Payer: Self-pay | Admitting: Gynecologic Oncology

## 2020-11-25 NOTE — Telephone Encounter (Signed)
Called the patient to see how she is feeling. Feeling about the same as yesterday, still having burning pain across her upper abdomen. She had quite a number of bowel movements after oral contrast yesterday (mostly loose/diarrhea). + flatus. Tolerating PO.  Discussed CT findings, mildly elevated amylase and lipase. Discussed risk that this could be early pancreatitis. I've asked her to call her PCP for close follow-up and repeat labs. I also discussed precautions that should prompt her to present for evaluation in a more urgent manner.  Jeral Pinch MD Gynecologic Oncology

## 2020-11-27 ENCOUNTER — Encounter: Payer: Self-pay | Admitting: Family Medicine

## 2020-11-27 ENCOUNTER — Telehealth: Payer: Self-pay

## 2020-11-27 ENCOUNTER — Other Ambulatory Visit: Payer: Self-pay

## 2020-11-27 ENCOUNTER — Emergency Department (HOSPITAL_COMMUNITY)
Admission: EM | Admit: 2020-11-27 | Discharge: 2020-11-28 | Disposition: A | Discharge status: Home / Self Care | Payer: Medicare Other | Attending: Emergency Medicine | Admitting: Emergency Medicine

## 2020-11-27 ENCOUNTER — Ambulatory Visit (INDEPENDENT_AMBULATORY_CARE_PROVIDER_SITE_OTHER): Payer: Medicare Other | Admitting: Family Medicine

## 2020-11-27 VITALS — BP 158/86 | HR 123 | Temp 98.3°F | Wt 210.0 lb

## 2020-11-27 DIAGNOSIS — R079 Chest pain, unspecified: Secondary | ICD-10-CM | POA: Diagnosis not present

## 2020-11-27 DIAGNOSIS — M7989 Other specified soft tissue disorders: Secondary | ICD-10-CM | POA: Diagnosis not present

## 2020-11-27 DIAGNOSIS — K429 Umbilical hernia without obstruction or gangrene: Secondary | ICD-10-CM | POA: Diagnosis not present

## 2020-11-27 DIAGNOSIS — J984 Other disorders of lung: Secondary | ICD-10-CM | POA: Diagnosis not present

## 2020-11-27 DIAGNOSIS — K219 Gastro-esophageal reflux disease without esophagitis: Secondary | ICD-10-CM | POA: Diagnosis not present

## 2020-11-27 DIAGNOSIS — R109 Unspecified abdominal pain: Secondary | ICD-10-CM | POA: Diagnosis present

## 2020-11-27 DIAGNOSIS — Z90721 Acquired absence of ovaries, unilateral: Secondary | ICD-10-CM

## 2020-11-27 DIAGNOSIS — J9811 Atelectasis: Secondary | ICD-10-CM | POA: Diagnosis not present

## 2020-11-27 DIAGNOSIS — I5042 Chronic combined systolic (congestive) and diastolic (congestive) heart failure: Secondary | ICD-10-CM | POA: Diagnosis not present

## 2020-11-27 DIAGNOSIS — I2510 Atherosclerotic heart disease of native coronary artery without angina pectoris: Secondary | ICD-10-CM | POA: Insufficient documentation

## 2020-11-27 DIAGNOSIS — R000 Tachycardia, unspecified: Secondary | ICD-10-CM

## 2020-11-27 DIAGNOSIS — Z87891 Personal history of nicotine dependence: Secondary | ICD-10-CM | POA: Insufficient documentation

## 2020-11-27 DIAGNOSIS — Z9079 Acquired absence of other genital organ(s): Secondary | ICD-10-CM

## 2020-11-27 DIAGNOSIS — J432 Centrilobular emphysema: Secondary | ICD-10-CM | POA: Diagnosis not present

## 2020-11-27 DIAGNOSIS — N3001 Acute cystitis with hematuria: Secondary | ICD-10-CM | POA: Diagnosis not present

## 2020-11-27 DIAGNOSIS — K5730 Diverticulosis of large intestine without perforation or abscess without bleeding: Secondary | ICD-10-CM | POA: Diagnosis not present

## 2020-11-27 DIAGNOSIS — J449 Chronic obstructive pulmonary disease, unspecified: Secondary | ICD-10-CM | POA: Diagnosis not present

## 2020-11-27 DIAGNOSIS — R748 Abnormal levels of other serum enzymes: Secondary | ICD-10-CM | POA: Diagnosis not present

## 2020-11-27 DIAGNOSIS — R1013 Epigastric pain: Secondary | ICD-10-CM

## 2020-11-27 DIAGNOSIS — Z79899 Other long term (current) drug therapy: Secondary | ICD-10-CM | POA: Diagnosis not present

## 2020-11-27 DIAGNOSIS — I130 Hypertensive heart and chronic kidney disease with heart failure and stage 1 through stage 4 chronic kidney disease, or unspecified chronic kidney disease: Secondary | ICD-10-CM | POA: Diagnosis not present

## 2020-11-27 DIAGNOSIS — Z853 Personal history of malignant neoplasm of breast: Secondary | ICD-10-CM | POA: Diagnosis not present

## 2020-11-27 DIAGNOSIS — Z955 Presence of coronary angioplasty implant and graft: Secondary | ICD-10-CM | POA: Diagnosis not present

## 2020-11-27 DIAGNOSIS — N183 Chronic kidney disease, stage 3 unspecified: Secondary | ICD-10-CM | POA: Diagnosis not present

## 2020-11-27 DIAGNOSIS — I700 Atherosclerosis of aorta: Secondary | ICD-10-CM | POA: Diagnosis not present

## 2020-11-27 DIAGNOSIS — Z7982 Long term (current) use of aspirin: Secondary | ICD-10-CM | POA: Insufficient documentation

## 2020-11-27 LAB — COMPREHENSIVE METABOLIC PANEL
ALT: 30 U/L (ref 0–44)
AST: 33 U/L (ref 15–41)
Albumin: 3.5 g/dL (ref 3.5–5.0)
Alkaline Phosphatase: 85 U/L (ref 38–126)
Anion gap: 11 (ref 5–15)
BUN: 12 mg/dL (ref 8–23)
CO2: 20 mmol/L — ABNORMAL LOW (ref 22–32)
Calcium: 9.1 mg/dL (ref 8.9–10.3)
Chloride: 107 mmol/L (ref 98–111)
Creatinine, Ser: 1.25 mg/dL — ABNORMAL HIGH (ref 0.44–1.00)
GFR, Estimated: 46 mL/min — ABNORMAL LOW (ref >60)
Glucose, Bld: 114 mg/dL — ABNORMAL HIGH (ref 70–99)
Potassium: 4.2 mmol/L (ref 3.5–5.1)
Sodium: 138 mmol/L (ref 135–145)
Total Bilirubin: 0.5 mg/dL (ref 0.3–1.2)
Total Protein: 8 g/dL (ref 6.5–8.1)

## 2020-11-27 LAB — CBC
HCT: 44.1 % (ref 36.0–46.0)
Hemoglobin: 13.3 g/dL (ref 12.0–15.0)
MCH: 26 pg (ref 26.0–34.0)
MCHC: 30.2 g/dL (ref 30.0–36.0)
MCV: 86.1 fL (ref 80.0–100.0)
Platelets: 319 10*3/uL (ref 150–400)
RBC: 5.12 MIL/uL — ABNORMAL HIGH (ref 3.87–5.11)
RDW: 16.8 % — ABNORMAL HIGH (ref 11.5–15.5)
WBC: 9 10*3/uL (ref 4.0–10.5)
nRBC: 0 % (ref 0.0–0.2)

## 2020-11-27 LAB — URINALYSIS, ROUTINE W REFLEX MICROSCOPIC
Bilirubin Urine: NEGATIVE
Glucose, UA: NEGATIVE mg/dL
Ketones, ur: NEGATIVE mg/dL
Nitrite: NEGATIVE
Protein, ur: NEGATIVE mg/dL
Specific Gravity, Urine: 1.02 (ref 1.005–1.030)
WBC, UA: >50 WBC/hpf — ABNORMAL HIGH (ref 0–5)
pH: 5 (ref 5.0–8.0)

## 2020-11-27 LAB — LIPASE, BLOOD: Lipase: 65 U/L — ABNORMAL HIGH (ref 11–51)

## 2020-11-27 NOTE — ED Triage Notes (Addendum)
Pt presents to ED POV. Pt c/o upper abd pain that radiates towards back. Pt reports she was sent here by PCP for eval for pancreatitis. Pt also c/o worsening pain w/ eating. Reports burning sensation when eating. Pt had hysterectomy and oophorectomy 1/25.

## 2020-11-27 NOTE — Progress Notes (Signed)
Subjective:    Patient ID: Tracy [name], female    DOB: 06-07-1946, 75 y.o.   MRN: [id number]  No chief complaint on file.   HPI Patient is a 75 yo female with pmh sig for HTN, h/o MI, MR, CAD, COPD, OA, gout, CKD 3, and pelvic mass s/p robotic BSO and removal of left labial serous cystadenoma and benign hidradenoma pailliferum 11/10/2020, who is followed by Dr. [name] and seen for acute concern.    Pt with band like upper abdominal pain radiating to back since 2/8 with intermittent L upper chest soreness.  Seen by OB/Gyn post op f/u 2/8.  Labs elevated including lipase 80, amylase 24, WBC 11.5.  CT abdomen pelvis with contrast on 2/8 with severe sigmoid diverticulosis and aortic atherosclerosis.  Pain increasing.  Pt notes decreased appetite was able to eat an egg, a biscuit, and bacon this morning.  Not drinking much fluids.  Denies constipation, diarrhea, n/v, fever, chills, increased SOB.  Past Medical History:  Diagnosis Date  . Allergic rhinitis   . Anxiety   . Arthritis   . Bronchitis   . CAD (coronary artery disease)    1/19 PCI/DES to [facility] for ISR, normal EF.   . Cancer ([place])    hx of precancerous cells in right breast   . Cervical dysplasia    unsure of procedure, possible "burning" in her late 46s  . CHF (congestive heart failure) ([place])    Echo 06/2019: EF 55-60, elevated LVEDP, normal RV SF, mild MAC, mild MR, trivial TR  . Complication of anesthesia   . COPD (chronic obstructive pulmonary disease) ([place])    early   . Dyspnea    with exertion   . Family history of adverse reaction to anesthesia    daughter- problems wiht n/v  . GERD (gastroesophageal reflux disease)   . Gout   . Headache(784.0)   . Heart murmur    hx of years ago   . Hyperlipidemia   . Hypertension   . Low back pain   . Menopausal syndrome   . Myocardial infarct ([place]) 2007   hx of  . Overactive bladder   . PONV (postoperative nausea and vomiting)     Allergies  Allergen Reactions  .  Erythromycin Rash    But isn't certain  . Sulfamethoxazole Rash    ROS General: Denies fever, chills, night sweats, changes in weight changes in appetite HEENT: Denies headaches, ear pain, changes in vision, rhinorrhea, sore throat CV: Denies CP, palpitations, SOB, orthopnea Pulm: Denies SOB, cough, wheezing GI: Denies nausea, vomiting, diarrhea, constipation  + epigastric abdominal pain GU: Denies dysuria, hematuria, frequency, vaginal discharge Msk: Denies muscle cramps, joint pains Neuro: Denies weakness, numbness, tingling Skin: Denies rashes, bruising Psych: Denies depression, anxiety, hallucinations     Objective:    Blood pressure (!) 158/86, pulse (!) 123, temperature 98.3 F (36.8 C), temperature source Oral, weight 210 lb (95.3 kg), SpO2 96 %.  Gen. Pleasant, well-nourished, appears mildly uncomfortable but nontoxic, normal affect  HEENT: [place]/AT, face symmetric, conjunctiva clear, no scleral icterus, PERRLA, EOMI, nares patent without drainage, pharynx without erythema or exudate. Lungs: no accessory muscle use Cardiovascular: Tachycardia no m/r/g, no peripheral edema Abdomen: BS present, soft, TTP over RUQ, epigastric area, and LUQ/ND. Musculoskeletal: No deformities, no cyanosis or clubbing, normal tone Neuro:  A&Ox3, CN II-XII intact, normal gait Skin:  Warm, dry, intact. Surgical incisions on abdomen without erythema, drainage, or induration.  Wt Readings from Last 3 Encounters:  11/24/20 211 lb (95.7 kg)  11/06/20 231 lb 12.8 oz (105.1 kg)  10/29/20 225 lb (102.1 kg)    Lab Results  Component Value Date   WBC 11.5 (H) 11/24/2020   HGB 12.8 11/24/2020   HCT 42.0 11/24/2020   PLT 264 11/24/2020   GLUCOSE 121 (H) 11/24/2020   CHOL 128 03/04/2020   TRIG 180 (H) 03/04/2020   HDL 36 (L) 03/04/2020   LDLDIRECT 111.0 01/05/2015   LDLCALC 62 03/04/2020   ALT 34 10/29/2020   AST 35 10/29/2020   NA 139 11/24/2020   K 4.3 11/24/2020   CL 109 11/24/2020    CREATININE 1.20 (H) 11/24/2020   BUN 21 11/24/2020   CO2 21 (L) 11/24/2020   TSH 2.02 01/14/2014   INR 1.0 11/03/2017   MICROALBUR 1.7 06/27/2018    Assessment/Plan:  Tachycardia -Likely 2/2 dehydration and pain  Elevated amylase and lipase -Lipase 80 and amylase 104 on 11/24/2020  Epigastric pain  History of salpingoophorectomy -s/p robotic assisted bilateral salpingo-oophorectomy with mini laparotomy for drainage-18 cm complex adnexal mass, and vulvar cyst on 11/10/20 -Followed by OB/GYN, Dr. Berline Lopes  Given pt history of recent laparoscopic surgery, elevated amylase and lipase as well as increasing abdominal pain concern for pancreatitis and dehydration.  Will send pt to ED for further eval and treatment such as IVFs, repeat labs.  Pt expressed understanding.  Will proceed to [facility] via car.  ED called by this provider with report.  F/u prn  Grier Mitts, MD

## 2020-11-27 NOTE — Telephone Encounter (Signed)
Ms Putnam states.that she is not feeling better.  The pain feels like a band around her midriff. Pain is under her ribs and around to her lower back .  She is experiencing some RLQ pain especially with bending. Pain is currently a 7/10. She took 1000 mg of tylenol at 0800.   She is eating a small amount of soft food like yogurt  And apple sauce.  She is has soft formed stools. No vomiting. Afebrile. Reviewed with Dr. Berline Lopes. Told Ms Penniman that Dr. Berline Lopes feels that her symptoms and lab work indicate she may be developing pancreatitis. She needs to contact her PCP as Dr. Berline Lopes discussed with her on 11-25-20 for close f/u and repeat of amylase and lipase. If the pain becomes more severe and or experiencing vomiting, She would need to go the ED to be evaluated. Pt verbalized understanding.

## 2020-11-28 ENCOUNTER — Emergency Department (HOSPITAL_COMMUNITY): Payer: Medicare Other

## 2020-11-28 ENCOUNTER — Encounter (HOSPITAL_COMMUNITY): Payer: Self-pay | Admitting: Student

## 2020-11-28 DIAGNOSIS — R000 Tachycardia, unspecified: Secondary | ICD-10-CM | POA: Diagnosis not present

## 2020-11-28 DIAGNOSIS — K5730 Diverticulosis of large intestine without perforation or abscess without bleeding: Secondary | ICD-10-CM | POA: Diagnosis not present

## 2020-11-28 DIAGNOSIS — M7989 Other specified soft tissue disorders: Secondary | ICD-10-CM | POA: Diagnosis not present

## 2020-11-28 DIAGNOSIS — N3001 Acute cystitis with hematuria: Secondary | ICD-10-CM | POA: Diagnosis not present

## 2020-11-28 DIAGNOSIS — J9811 Atelectasis: Secondary | ICD-10-CM | POA: Diagnosis not present

## 2020-11-28 DIAGNOSIS — I700 Atherosclerosis of aorta: Secondary | ICD-10-CM | POA: Diagnosis not present

## 2020-11-28 DIAGNOSIS — I2510 Atherosclerotic heart disease of native coronary artery without angina pectoris: Secondary | ICD-10-CM | POA: Diagnosis not present

## 2020-11-28 DIAGNOSIS — J432 Centrilobular emphysema: Secondary | ICD-10-CM | POA: Diagnosis not present

## 2020-11-28 DIAGNOSIS — J984 Other disorders of lung: Secondary | ICD-10-CM | POA: Diagnosis not present

## 2020-11-28 DIAGNOSIS — R1013 Epigastric pain: Secondary | ICD-10-CM | POA: Diagnosis not present

## 2020-11-28 DIAGNOSIS — K429 Umbilical hernia without obstruction or gangrene: Secondary | ICD-10-CM | POA: Diagnosis not present

## 2020-11-28 LAB — TROPONIN I (HIGH SENSITIVITY)
Troponin I (High Sensitivity): 11 ng/L (ref <18)
Troponin I (High Sensitivity): 11 ng/L (ref <18)

## 2020-11-28 MED ORDER — IOHEXOL 350 MG/ML SOLN
100.0000 mL | Freq: Once | INTRAVENOUS | Status: AC | PRN
  Administered 2020-11-28: 100 mL via INTRAVENOUS

## 2020-11-28 MED ORDER — CEPHALEXIN 500 MG PO CAPS: 500.0000 mg | ORAL_CAPSULE | Freq: Four times a day (QID) | ORAL | 0 refills | Status: DC

## 2020-11-28 MED ORDER — PANTOPRAZOLE SODIUM 40 MG IV SOLR
40.0000 mg | Freq: Once | INTRAVENOUS | Status: AC
  Administered 2020-11-28: 40 mg via INTRAVENOUS
  Filled 2020-11-28: qty 40

## 2020-11-28 MED ORDER — PANTOPRAZOLE SODIUM 40 MG PO TBEC: 40.0000 mg | DELAYED_RELEASE_TABLET | Freq: Every day | ORAL | 0 refills | Status: DC

## 2020-11-28 MED ORDER — SODIUM CHLORIDE 0.9 % IV SOLN
1.0000 g | Freq: Once | INTRAVENOUS | Status: AC
  Administered 2020-11-28: 1 g via INTRAVENOUS
  Filled 2020-11-28: qty 10

## 2020-11-28 MED ORDER — ONDANSETRON HCL 4 MG/2ML IJ SOLN
4.0000 mg | Freq: Once | INTRAMUSCULAR | Status: AC
  Administered 2020-11-28: 4 mg via INTRAVENOUS
  Filled 2020-11-28: qty 2

## 2020-11-28 MED ORDER — ONDANSETRON 4 MG PO TBDP: 4.0000 mg | ORAL_TABLET | Freq: Three times a day (TID) | ORAL | 0 refills | Status: DC | PRN

## 2020-11-28 MED ORDER — SUCRALFATE 1 GM/10ML PO SUSP: 1.0000 g | Freq: Three times a day (TID) | ORAL | 0 refills | Status: DC

## 2020-11-28 MED ORDER — MORPHINE SULFATE (PF) 4 MG/ML IV SOLN
4.0000 mg | Freq: Once | INTRAVENOUS | Status: AC
  Administered 2020-11-28: 4 mg via INTRAVENOUS
  Filled 2020-11-28: qty 1

## 2020-11-28 NOTE — ED Notes (Signed)
Patient transported to CT scan . 

## 2020-11-28 NOTE — ED Provider Notes (Signed)
[facility] EMERGENCY DEPARTMENT Provider Note   CSN: [id number] Arrival date & time: 11/27/20  1617     History Chief Complaint  Patient presents with  . Abdominal Pain    Tracy [name] is a 75 y.o. female with a history of CHF, CAD, COPD, hypertension, hyperlipidemia, and CKD who presents to the emergency department with complaints of worsening abdominal pain for the past few days.  Patient states that she had a surgical procedure to remove masses as well as her ovaries and fallopian tubes on 11/10/2020 by Dr. Victorio Palm chart review it appears that she had a robot-assisted laparoscopic bilateral salpingo-oophorectomy, mini laparotomy for contained cyst drainage and delivery of enlarged right ovary, excision of 1 cm left vulvar lesion as well.  She states she is having some discomfort postoperatively which was expected, however about a week after surgery started to have some increased pain to the epigastrium over the past 3 days this has worsened some, she is having a burning pain to the epigastrium that radiates into her chest, to her back, into her groin at times.  Pain is worse with eating, was alleviated with Tylenol at one point but this does not seem to be helping anymore.  Has had associated nausea. She denies fever, chills, vomiting, dyspnea, melena, hematochezia, diarrhea, dysuria, or vaginal bleeding.  HPI     Past Medical History:  Diagnosis Date  . Allergic rhinitis   . Anxiety   . Arthritis   . Bronchitis   . CAD (coronary artery disease)    1/19 PCI/DES to [facility] for ISR, normal EF.   . Cancer ([place])    hx of precancerous cells in right breast   . Cervical dysplasia    unsure of procedure, possible "burning" in her late 31s  . CHF (congestive heart failure) ([place])    Echo 06/2019: EF 55-60, elevated LVEDP, normal RV SF, mild MAC, mild MR, trivial TR  . Complication of anesthesia   . COPD (chronic obstructive pulmonary disease) ([place])    early   .  Dyspnea    with exertion   . Family history of adverse reaction to anesthesia    daughter- problems wiht n/v  . GERD (gastroesophageal reflux disease)   . Gout   . Headache(784.0)   . Heart murmur    hx of years ago   . Hyperlipidemia   . Hypertension   . Low back pain   . Menopausal syndrome   . Myocardial infarct ([place]) 2007   hx of  . Overactive bladder   . PONV (postoperative nausea and vomiting)     Patient Active Problem List   Diagnosis Date Noted  . Pelvic mass in female   . Bilateral lower extremity edema 04/13/2020  . Shortness of breath   . Anxiety disorder 10/29/2018  . CKD (chronic kidney disease), stage III ([place]) 06/27/2018  . COPD (chronic obstructive pulmonary disease) ([place]) 05/25/2018  . Obesity 03/20/2018  . Dyspnea on exertion 11/06/2017  . Abnormal stress test 11/06/2017  . Chronic diastolic CHF (congestive heart failure) ([place]) 10/03/2017  . Chronic combined systolic and diastolic heart failure ([place]) 09/04/2017  . Left ventricular dysfunction 07/31/2017  . Cataract, nuclear sclerotic senile, bilateral 02/01/2017  . Gouty arthritis of toe of left foot 08/09/2016  . Hyperuricemia 08/09/2016  . Osteoarthritis (arthritis due to wear and tear of joints) 01/14/2014  . Coronary artery disease 08/02/2011  . Mitral regurgitation 08/02/2011  . Hx of tobacco abuse 08/02/2011  . OVERACTIVE  BLADDER 07/02/2010  . ACUTE CYSTITIS 05/25/2010  . Pure hypercholesterolemia 08/11/2009  . PEDAL EDEMA 07/22/2009  . GERD 04/11/2008  . Essential hypertension 05/30/2007  . MYOCARDIAL INFARCTION, HX OF 05/30/2007  . Allergic rhinitis 05/30/2007  . LOW BACK PAIN 05/30/2007    Past Surgical History:  Procedure Laterality Date  . ANGIOPLASTY     stent 2007  . BREAST LUMPECTOMY WITH RADIOACTIVE SEED LOCALIZATION Right 04/28/2020   Procedure: RIGHT BREAST LUMPECTOMY X 2  WITH RADIOACTIVE SEED LOCALIZATION;  Surgeon: Donnie Mesa, MD;  Location: [facility];   Service: General;  Laterality: Right;  LMA  . CARDIAC CATHETERIZATION    . COLONOSCOPY    . CORONARY STENT INTERVENTION N/A 11/06/2017   Procedure: CORONARY STENT INTERVENTION;  Surgeon: [name], MD;  Location: [facility];  Service: Cardiovascular;  Laterality: N/A;  . LEFT HEART CATH AND CORONARY ANGIOGRAPHY N/A 11/06/2017   Procedure: LEFT HEART CATH AND CORONARY ANGIOGRAPHY;  Surgeon: [name], MD;  Location: [facility];  Service: Cardiovascular;  Laterality: N/A;  . LEFT HEART CATH AND CORONARY ANGIOGRAPHY N/A 07/24/2019   Procedure: LEFT HEART CATH AND CORONARY ANGIOGRAPHY;  Surgeon: Burnell Blanks, MD;  Location: [facility];  Service: Cardiovascular;  Laterality: N/A;  . RIGHT/LEFT HEART CATH AND CORONARY ANGIOGRAPHY N/A 09/29/2020   Procedure: RIGHT/LEFT HEART CATH AND CORONARY ANGIOGRAPHY;  Surgeon: Belva Crome, MD;  Location: [facility];  Service: Cardiovascular;  Laterality: N/A;  . ROBOTIC ASSISTED BILATERAL SALPINGO OOPHERECTOMY Bilateral 11/10/2020   Procedure: XI ROBOTIC ASSISTED BILATERAL SALPINGO OOPHORECTOMY WITH MINI LAPAROTOMY FOR DRAINAGE;  Surgeon: Lafonda Mosses, MD;  Location: [facility];  Service: Gynecology;  Laterality: Bilateral;  MINI LAP FIRST  . TONSILLECTOMY    . VULVECTOMY N/A 11/10/2020   Procedure: WIDE EXCISION VULVECTOMY;  Surgeon: Lafonda Mosses, MD;  Location: [facility];  Service: Gynecology;  Laterality: N/A;     OB History    Gravida  2   Para  2   Term      Preterm      AB      Living        SAB      IAB      Ectopic      Multiple      Live Births              Family History  Adopted: Yes  Problem Relation Age of Onset  . Colon cancer Mother 104  . Other Other        patient is adopted  . Diabetes Daughter        type 2  . Stomach cancer Neg Hx     Social History   Tobacco Use  . Smoking status: Former Smoker    Packs/day: 0.20    Years: 50.00    Pack  years: 10.00    Types: Cigarettes    Quit date: 10/18/2015    Years since quitting: 5.1  . Smokeless tobacco: Never Used  . Tobacco comment: completely quit May of 2018; period of years she did not smoke   Vaping Use  . Vaping Use: Never used  Substance Use Topics  . Alcohol use: Yes    Comment: seldom  . Drug use: No    Home Medications Prior to Admission medications   Medication Sig Start Date End Date Taking? Authorizing Provider  acetaminophen (TYLENOL) 500 MG tablet Take 500-1,000 mg by mouth every  6 (six) hours as needed for headache.     [provider]  albuterol (PROVENTIL) (2.5 MG/3ML) 0.083% nebulizer solution Take 3 mLs (2.5 mg total) by nebulization every 6 (six) hours as needed for wheezing or shortness of breath. 06/14/19   Byrum, Rose Fillers, MD  albuterol (VENTOLIN HFA) 108 (90 Base) MCG/ACT inhaler Inhale 2 puffs into the lungs every 4 (four) hours as needed for wheezing. 10/12/20   Collene Gobble, MD  allopurinol (ZYLOPRIM) 100 MG tablet Take 1 tablet (100 mg total) by mouth daily. 04/13/20   [name], [name], MD  ALPRAZolam Duanne Moron) 0.25 MG tablet Take 0.25 mg by mouth daily as needed for anxiety.    [provider]  amLODipine (NORVASC) 5 MG tablet Take 1 tablet (5 mg total) by mouth daily. 10/13/20   Weaver, Scott T, PA-C  ANORO ELLIPTA 62.5-25 MCG/INH AEPB TAKE 1 PUFF BY MOUTH EVERY DAY Patient taking differently: Inhale 1 puff into the lungs daily. 01/01/20   Collene Gobble, MD  APPLE CIDER VINEGAR PO Take 2,400 mg by mouth daily.    [provider]  ASPERCREME LIDOCAINE EX Apply 1 application topically daily as needed (pain).    [provider]  aspirin EC 81 MG tablet Take 81 mg by mouth daily.    [provider]  calcium carbonate (CALCIUM 600) 600 MG TABS tablet Take 600 mg by mouth See admin instructions. Take 600 mg every 2 months    [provider]  Camphor-Eucalyptus-Menthol (VICKS VAPORUB EX) Apply 1  application topically daily as needed (congestion).    [provider]  cetirizine (ZYRTEC) 10 MG tablet Take 10 mg by mouth daily.    [provider]  Cholecalciferol (VITAMIN D3) 50 MCG (2000 UT) TABS Take 4,000 Units by mouth daily.     [provider]  clopidogrel (PLAVIX) 75 MG tablet TAKE 1 TABLET (75 MG TOTAL) BY MOUTH DAILY WITH BREAKFAST. 08/19/20   Imogene Burn, PA-C  Cyanocobalamin (VITAMIN B-12) 5000 MCG SUBL Place 5,000 mcg under the tongue daily.     [provider]  ezetimibe (ZETIA) 10 MG tablet Take 1 tablet (10 mg total) by mouth daily. 12/02/19 11/26/20  Sherren Mocha, MD  fluticasone (FLONASE) 50 MCG/ACT nasal spray SPRAY 2 SPRAYS INTO EACH NOSTRIL EVERY DAY Patient taking differently: Place 2 sprays into both nostrils daily. 02/20/20   Collene Gobble, MD  furosemide (LASIX) 40 MG tablet Take 40 mg by mouth daily as needed for fluid or edema.    [provider]  guaiFENesin (MUCINEX) 600 MG 12 hr tablet Take 600 mg by mouth 2 (two) times daily as needed (congestion).    [provider]  isosorbide mononitrate (IMDUR) 30 MG 24 hr tablet Take 1 tablet (30 mg total) by mouth daily. 09/22/20   Richardson Dopp T, PA-C  losartan (COZAAR) 100 MG tablet TAKE 1 TABLET BY MOUTH EVERY DAY Patient taking differently: Take 100 mg by mouth daily. 06/01/20   [name], [name], MD  Menthol-Methyl Salicylate (SALONPAS PAIN RELIEF PATCH EX) Apply 1 patch topically daily as needed (pain).    [provider]  metoprolol succinate (TOPROL-XL) 100 MG 24 hr tablet TAKE 1 TABLET (100 MG TOTAL) BY MOUTH DAILY. TAKE WITH OR IMMEDIATELY FOLLOWING A MEAL. 09/18/20 09/13/21  Sherren Mocha, MD  Misc Natural Products [facility] ADVANCED) CAPS Take 1,000 capsules by mouth daily.     [provider]  nitroGLYCERIN (NITROSTAT) 0.4 MG SL tablet  Place 1 tablet (0.4 mg total) under the tongue every 5 (five) minutes as needed for chest  pain. Patient not taking: Reported on 11/24/2020 09/22/20   Richardson Dopp T, PA-C  oxymetazoline (AFRIN) 0.05 % nasal spray Place 1 spray into both nostrils 2 (two) times daily as needed for congestion.    [provider]  pantoprazole (PROTONIX) 20 MG tablet TAKE 1 TABLET BY MOUTH EVERY DAY Patient taking differently: Take 20 mg by mouth daily. 07/03/20   [name], [name], MD  potassium chloride SA (KLOR-CON) 20 MEQ tablet Take 20 mEq by mouth daily.    [provider]  senna-docusate (SENOKOT-S) 8.6-50 MG tablet Take 2 tablets by mouth at bedtime. For AFTER surgery, do not take if having diarrhea 10/27/20   Joylene John D, NP  Simethicone (GAS-X PO) Take 2 tablets by mouth daily as needed (gas).    [provider]    Allergies    Erythromycin and Sulfamethoxazole  Review of Systems   Review of Systems  Constitutional: Negative for chills and fever.  Respiratory: Negative for shortness of breath.   Cardiovascular: Positive for chest pain.  Gastrointestinal: Positive for abdominal pain and nausea. Negative for blood in stool, constipation, diarrhea and vomiting.  Genitourinary: Negative for dysuria and vaginal bleeding.  Neurological: Negative for syncope.  All other systems reviewed and are negative.   Physical Exam Updated Vital Signs BP (!) 141/106   Pulse 92   Temp 98.4 F (36.9 C) (Oral)   Resp 13   SpO2 97%   Physical Exam Vitals and nursing note reviewed.  Constitutional:      General: She is not in acute distress.    Appearance: She is well-developed. She is not toxic-appearing.  HENT:     Head: Normocephalic and atraumatic.  Eyes:     General:        Right eye: No discharge.        Left eye: No discharge.     Conjunctiva/sclera: Conjunctivae normal.  Cardiovascular:     Rate and Rhythm: Normal rate and regular rhythm.     Comments: 2+ symmetric radial/DP pulses bilaterally.  Pulmonary:     Effort: Pulmonary effort is normal. No  respiratory distress.     Breath sounds: Normal breath sounds. No wheezing, rhonchi or rales.  Abdominal:     General: A surgical scar is present. There is no distension.     Palpations: Abdomen is soft.     Tenderness: There is abdominal tenderness (primarily to the epigastrium, mild diffuse to lower abdomen). There is no guarding or rebound.     Comments: Surgical incision sites with scabbing, no significant surrounding erythema/warmth/purulent drainage.  Musculoskeletal:     Cervical back: Neck supple.  Skin:    General: Skin is warm and dry.     Findings: No rash.  Neurological:     Mental Status: She is alert.     Comments: Clear speech.   Psychiatric:        Behavior: Behavior normal.     ED Results / Procedures / Treatments   Labs (all labs ordered are listed, but only abnormal results are displayed) Labs Reviewed  LIPASE, BLOOD - Abnormal; Notable for the following components:      Result Value   Lipase 65 (*)    All other components within normal limits  COMPREHENSIVE METABOLIC PANEL - Abnormal; Notable for the following components:   CO2 20 (*)    Glucose, Bld 114 (*)  Creatinine, Ser 1.25 (*)    GFR, Estimated 46 (*)    All other components within normal limits  CBC - Abnormal; Notable for the following components:   RBC 5.12 (*)    RDW 16.8 (*)    All other components within normal limits  URINALYSIS, ROUTINE W REFLEX MICROSCOPIC - Abnormal; Notable for the following components:   Color, Urine AMBER (*)    APPearance HAZY (*)    Hgb urine dipstick SMALL (*)    Leukocytes,Ua LARGE (*)    WBC, UA >50 (*)    Bacteria, UA MANY (*)    All other components within normal limits  URINE CULTURE  TROPONIN I (HIGH SENSITIVITY)  TROPONIN I (HIGH SENSITIVITY)    EKG EKG Interpretation  Date/Time:  Friday November 27 2020 16:44:59 EST Ventricular Rate:  119 PR Interval:  150 QRS Duration: 88 QT Interval:  316 QTC Calculation: 444 R Axis:   -24 Text  Interpretation: Sinus tachycardia with occasional Premature ventricular complexes Minimal voltage criteria for LVH, may be normal variant ( R in aVL ) Borderline ECG Since last tracing rate faster Otherwise no significant change Confirmed by Deno Etienne [phone]) on 11/28/2020 6:48:14 AM   Radiology CT Angio Chest/Abd/Pel for Dissection W and/or W/WO  Result Date: 11/28/2020 CLINICAL DATA:  Abdominal pain, aortic dissection suspected EXAM: CT ANGIOGRAPHY CHEST, ABDOMEN AND PELVIS TECHNIQUE: Non-contrast CT of the chest was initially obtained. Multidetector CT imaging through the chest, abdomen and pelvis was performed using the standard protocol during bolus administration of intravenous contrast. Multiplanar reconstructed images and MIPs were obtained and reviewed to evaluate the vascular anatomy. CONTRAST:  13m OMNIPAQUE IOHEXOL 350 MG/ML SOLN COMPARISON:  CT abdomen pelvis 11/24/2020 FINDINGS: CTA CHEST FINDINGS Cardiovascular: Preferential opacification of the thoracic aorta. No evidence of thoracic aortic aneurysm or dissection. Normal heart size. No significant pericardial effusion. Mild to moderate calcified and noncalcified atherosclerotic plaque of the thoracic aorta. Severe four-vessel coronary artery calcifications. The main pulmonary artery is normal in caliber. Unable to evaluate for pulmonary embolus due to timing of contrast. Mediastinum/Nodes: No enlarged mediastinal, hilar, or axillary lymph nodes. Thyroid gland, trachea, and esophagus demonstrate no significant findings. Lungs/Pleura: At least moderate centrilobular emphysematous changes. Bilateral lower lobe subsegmental atelectasis. Trace biapical pleural/pulmonary scarring. Few scattered pulmonary micronodules. No pulmonary mass. No pleural effusion. No pneumothorax. Musculoskeletal: No chest wall abnormality. No suspicious lytic or blastic osseous lesions. No acute displaced fracture. Multilevel mild degenerative changes of the spine. CTA  ABDOMEN AND PELVIS FINDINGS VASCULAR Aorta: Moderate to severe calcified and noncalcified normal caliber aorta without aneurysm, dissection, vasculitis or significant stenosis. Celiac: Patent without evidence of aneurysm, dissection, vasculitis or significant stenosis. SMA: Patent without evidence of aneurysm, dissection, vasculitis or significant stenosis. Renals: Mild to moderate atherosclerotic plaque of the origins. Both renal arteries are patent without evidence of aneurysm, dissection, vasculitis, fibromuscular dysplasia or significant stenosis. IMA: Patent without evidence of aneurysm, dissection, vasculitis or significant stenosis. Inflow: Mild to moderate atherosclerotic plaque. Patent without evidence of aneurysm, dissection, vasculitis or significant stenosis. Veins: No obvious venous abnormality within the limitations of this arterial phase study. NON-VASCULAR Hepatobiliary: No focal liver abnormality. No gallstones, gallbladder wall thickening, or pericholecystic fluid. No biliary dilatation. Pancreas: No focal lesion. Normal pancreatic contour. No surrounding inflammatory changes. No main pancreatic ductal dilatation. Spleen: Heterogeneous splenic parenchyma likely due to timing of contrast. Normal in size without focal abnormality. Adrenals/Urinary Tract: No adrenal nodule bilaterally. Bilateral kidneys enhance symmetrically. No hydronephrosis. No hydroureter. The  urinary bladder is unremarkable. Stomach/Bowel: Stomach is within normal limits. No evidence of bowel wall thickening or dilatation. Diffuse colonic diverticulosis. The appendix is not definitely identified. Lymphatic: No lymphadenopathy. Reproductive: Uterus and bilateral adnexa are unremarkable. Other: No intraperitoneal free fluid. No intraperitoneal free gas. No organized fluid collection. Musculoskeletal: Redemonstration of subcutaneus soft tissue fat stranding surrounding the periumbilical region. No organized fluid collection. Cannot  rule out a tiny fat containing umbilical hernia. No suspicious lytic or blastic osseous lesions. No acute displaced fracture. Multilevel degenerative changes of the spine with multilevel intervertebral disc space vacuum phenomenon. Review of the MIP images confirms the above findings. IMPRESSION: 1. No acute vascular abnormality. 2. No acute intrathoracic, intra-abdominal, intrapelvic abnormality. 3. Persistent subcutaneus soft tissue fat stranding surrounding the periumbilical region. Correlate with physical exam for signs of infection versus normal healing. No organized fluid collection. 4. Diffuse colonic diverticulosis with no definite acute diverticulitis. 5. Aortic Atherosclerosis (ICD10-I70.0) and Emphysema (ICD10-J43.9). Electronically Signed   By: Iven Finn M.D.   On: 11/28/2020 05:32    Procedures Procedures   Medications Ordered in ED Medications - No data to display  ED Course  I have reviewed the triage vital signs and the nursing notes.  Pertinent labs & imaging results that were available during my care of the patient were reviewed by me and considered in my medical decision making (see chart for details). MDM Rules/Calculators/A&P                          Patient presents to the ED with complaints of abdominal pain. Nontoxic, initial tachycardia is resolved, vitals are otherwise without significant abnormality.  On exam patient has surgical sites which appear to be healing, no obvious superimposed infection.  She has primarily epigastric tenderness palpation with some mild lower abdominal tenderness as well, no peritoneal signs.  DDx: GERD, PUD, dissection, ACS, PE, perforation, obstruction, gallbladder pathology, pancreatitis, UTI/pyelonephritis, post op complication.   Additional history obtained:  Additional history obtained from chart review & nursing note review.   EKG: No STEMI Lab Tests:  I reviewed and interpreted labs, which included:  CBC: Unremarkable CMP:  Creatinine seems to be at baseline compared to most recent labs, Lipase: Minimally elevated Troponin: 11, 11 Urinalysis: Appears concerning for urinary tract infection with many bacteria and large leukocytes, Rocephin given in the ED.  Imaging Studies ordered:  I ordered imaging studies which included CT angio chest/abdomen/pelvis per dissection protocol, I independently reviewed, formal radiology impression shows:  1. No acute vascular abnormality. 2. No acute intrathoracic, intra-abdominal, intrapelvic abnormality. 3. Persistent subcutaneus soft tissue fat stranding surrounding the periumbilical region. Correlate with physical exam for signs of infection versus normal healing. No organized fluid collection. 4. Diffuse colonic diverticulosis with no definite acute diverticulitis. 5. Aortic Atherosclerosis (ICD10-I70.0) and Emphysema (ICD10-J43.9)  ED Course:  Overall reassuring CT scan.  In regards to soft tissue stranding periumbilical region does not appear consistent with infection such as cellulitis/abscess.  Reassuring ED work-up.  No dissection, perforation, obstruction, or postoperative intra-abdominal abscess noted.  No acute surgical gallbladder or appendix pathology noted.  Troponins are flat EKG does not appear to have significant acute change, given pain is primarily in the abdomen doubt ACS.  CT scan also without obvious signs of PE, patient is also not hypoxic when awake  and her tachycardia normalized without significant intervention.  Unclear definitive etiology to patient's symptoms, will start on PPI, carafate, zofran and tx urine with  keflex. She is feeling better & ready to go home. I discussed results, treatment plan, need for follow-up, and return precautions with the patient. Provided opportunity for questions, patient confirmed understanding and is in agreement with plan.   Findings and plan of care discussed with supervising physician Dr. Stark Jock who is in agreement.    Portions  of this note were generated with [employer]. Dictation errors may occur despite best attempts at proofreading.  Final Clinical Impression(s) / ED Diagnoses Final diagnoses:  Abdominal pain, unspecified abdominal location  Acute cystitis with hematuria    Rx / DC Orders ED Discharge Orders         Ordered    sucralfate (CARAFATE) 1 GM/10ML suspension  3 times daily with meals & bedtime        11/28/20 0609    pantoprazole (PROTONIX) 40 MG tablet  Daily        11/28/20 0609    ondansetron (ZOFRAN ODT) 4 MG disintegrating tablet  Every 8 hours PRN        11/28/20 0609    cephALEXin (KEFLEX) 500 MG capsule  4 times daily        11/28/20 0609           Amaryllis Dyke, PA-C 11/28/20 [place], [place], DO 11/28/20 [phone]

## 2020-11-28 NOTE — Discharge Instructions (Addendum)
You were seen in the emergency department today for abdominal pain.  Your work-up was overall reassuring.  Your heart enzymes did not show an obvious heart attack.  Your labs and CT scan did not show significant abnormalities within your abdomen.  Your urine does appear infected therefore we are starting you on Keflex, antibiotic, to treat this potential infection.  Your pain may be related to stomach irritation therefore we are starting you on the following medicines:  We are sending you home with the following medications to help with your symptoms:  - Protonix- please take 1 tablet in the morning prior to any meals to help with stomach acidity/pain. - this is an increased dose- you are currently taking 20 mg and we would like you to start taking 40 mg.  - Carafate- please take prior to each meal and prior to bedtime to help with stomach acidity/pain.  - Zofran- please take every 8 hours as needed for nausea/vomiting.   We have prescribed you new medication(s) today. Discuss the medications prescribed today with your pharmacist as they can have adverse effects and interactions with your other medicines including over the counter and prescribed medications. Seek medical evaluation if you start to experience new or abnormal symptoms after taking one of these medicines, seek care immediately if you start to experience difficulty breathing, feeling of your throat closing, facial swelling, or rash as these could be indications of a more serious allergic reaction  Please follow attached diet guidelines.   Follow up with your primary care provider within 3 days for re-evaluation.  Return to the ER for new or worsening symptoms including but not limited to worsened pain, new pain, inability to keep fluids down, blood in vomit/stool, passing out, trouble breathing, or any other concerns.

## 2020-11-29 LAB — URINE CULTURE

## 2020-12-01 ENCOUNTER — Encounter: Payer: Medicare Other | Admitting: Gynecologic Oncology

## 2020-12-03 ENCOUNTER — Other Ambulatory Visit: Payer: Self-pay

## 2020-12-04 ENCOUNTER — Encounter: Payer: Self-pay | Admitting: Family Medicine

## 2020-12-04 ENCOUNTER — Telehealth: Payer: Self-pay | Admitting: Emergency Medicine

## 2020-12-04 ENCOUNTER — Ambulatory Visit (INDEPENDENT_AMBULATORY_CARE_PROVIDER_SITE_OTHER): Payer: Medicare Other | Admitting: Family Medicine

## 2020-12-04 VITALS — BP 130/80 | HR 99 | Resp 16 | Ht 65.0 in | Wt 212.0 lb

## 2020-12-04 DIAGNOSIS — I10 Essential (primary) hypertension: Secondary | ICD-10-CM | POA: Diagnosis not present

## 2020-12-04 DIAGNOSIS — K5900 Constipation, unspecified: Secondary | ICD-10-CM

## 2020-12-04 DIAGNOSIS — M25512 Pain in left shoulder: Secondary | ICD-10-CM

## 2020-12-04 DIAGNOSIS — R519 Headache, unspecified: Secondary | ICD-10-CM

## 2020-12-04 DIAGNOSIS — M545 Low back pain, unspecified: Secondary | ICD-10-CM | POA: Diagnosis not present

## 2020-12-04 DIAGNOSIS — N1831 Chronic kidney disease, stage 3a: Secondary | ICD-10-CM | POA: Diagnosis not present

## 2020-12-04 DIAGNOSIS — G8929 Other chronic pain: Secondary | ICD-10-CM | POA: Diagnosis not present

## 2020-12-04 DIAGNOSIS — R109 Unspecified abdominal pain: Secondary | ICD-10-CM

## 2020-12-04 MED ORDER — LACTULOSE 10 GM/15ML PO SOLN: 10.0000 g | Freq: Every day | ORAL | 0 refills | Status: DC | PRN

## 2020-12-04 NOTE — Patient Instructions (Addendum)
A few things to remember from today's visit:  Abdominal pain, unspecified abdominal location  Constipation, unspecified constipation type - Plan: lactulose (CHRONULAC) 10 GM/15ML solution  If you need refills please call your pharmacy. Do not use My Chart to request refills or for acute issues that ne Constipation, Adult Constipation is when a person has trouble pooping (having a bowel movement). When you have this condition, you may poop fewer than 3 times a week. Your poop (stool) may also be dry, hard, or bigger than normal. Follow these instructions at home: Eating and drinking  Eat foods that have a lot of fiber, such as: ? Fresh fruits and vegetables. ? Whole grains. ? Beans.  Eat less of foods that are low in fiber and high in fat and sugar, such as: ? [place] fries. ? Hamburgers. ? Cookies. ? Candy. ? Soda.  Drink enough fluid to keep your pee (urine) pale yellow.   General instructions  Exercise regularly or as told by your doctor. Try to do 150 minutes of exercise each week.  Go to the restroom when you feel like you need to poop. Do not hold it in.  Take over-the-counter and prescription medicines only as told by your doctor. These include any fiber supplements.  When you poop: ? Do deep breathing while relaxing your lower belly (abdomen). ? Relax your pelvic floor. The pelvic floor is a group of muscles that support the rectum, bladder, and intestines (as well as the uterus in women).  Watch your condition for any changes. Tell your doctor if you notice any.  Keep all follow-up visits as told by your doctor. This is important. Contact a doctor if:  You have pain that gets worse.  You have a fever.  You have not pooped for 4 days.  You vomit.  You are not hungry.  You lose weight.  You are bleeding from the opening of the butt (anus).  You have thin, pencil-like poop. Get help right away if:  You have a fever, and your symptoms suddenly get  worse.  You leak poop or have blood in your poop.  Your belly feels hard or bigger than normal (bloated).  You have very bad belly pain.  You feel dizzy or you faint. Summary  Constipation is when a person poops fewer than 3 times a week, has trouble pooping, or has poop that is dry, hard, or bigger than normal.  Eat foods that have a lot of fiber.  Drink enough fluid to keep your pee (urine) pale yellow.  Take over-the-counter and prescription medicines only as told by your doctor. These include any fiber supplements. This information is not intended to replace advice given to you by your health care provider. Make sure you discuss any questions you have with your health care provider. Document Revised: 08/21/2019 Document Reviewed: 08/21/2019 Elsevier Patient Education  2021 [place]. d immediate attention.   Lactulose daily until a good bowel movement. Bland food and small portions. Adequate hydration and fiber intake.  Headache diary.  Please be sure medication list is accurate. If a new problem present, please set up appointment sooner than planned today.

## 2020-12-04 NOTE — Assessment & Plan Note (Signed)
BP adequately controlled. Continue Amlodipine 5 mg daily,Imdur 3o mg daily,Metoprolol succinate 100 mg daily,and Losartan 100 mg daily. Low salt diet.

## 2020-12-04 NOTE — Assessment & Plan Note (Addendum)
Cr 1.2, e GFR upper 40's. Continue adequate hydration,low salt diet,and avoidance of NSAID's. Continue Losartan 100 mg daily.

## 2020-12-04 NOTE — Assessment & Plan Note (Signed)
Chronic. Could been aggravated by constipation. I do not think imaging is needed today. Tylenol 500 mg 3-4 times daily is the safest option for her.

## 2020-12-04 NOTE — Progress Notes (Signed)
Chief Complaint  Patient presents with  . Follow-up   HPI:  [name] is a 75 y.o. female, who is here today for follow up.   She was last seen on 04/13/20.  She was recently evaluated in the ER because abdominal pain, 11/27/20 and here on same day for tachycardia. CAD following with cardiologist.  HTN: Losartan 100 mg daily, Metoprolol succinate 100 mg daily, Imdur 30 mg daily,and Amlodipine 5 mg daily. Negative for chest pain, unusual dyspnea, palpitation, claudication, focal weakness, or worsening edema.  CKD III: She has not noted foam in urine,gross hematuria,or decreased urine output.  Lab Results  Component Value Date   CREATININE 1.25 (H) 11/27/2020   BUN 12 11/27/2020   NA 138 11/27/2020   K 4.2 11/27/2020   CL 107 11/27/2020   CO2 20 (L) 11/27/2020   Lab Results  Component Value Date   WBC 9.0 11/27/2020   HGB 13.3 11/27/2020   HCT 44.1 11/27/2020   MCV 86.1 11/27/2020   PLT 319 11/27/2020   Abdominal/pelvic CT on 11/24/20:  1. No acute intra-abdominal or pelvic pathology. 2. Interval resection of the previously seen pelvic mass. No fluid collection or abscess. 3. Severe sigmoid diverticulosis. No bowel obstruction. 4. Aortic Atherosclerosis (ICD10-I70.0).  Chest/abdomen CTA on 11/28/2020: 1. No acute vascular abnormality. 2. No acute intrathoracic, intra-abdominal, intrapelvic abnormality. 3. Persistent subcutaneus soft tissue fat stranding surrounding the periumbilical region. Correlate with physical exam for signs of infection versus normal healing. No organized fluid collection.  4. Diffuse colonic diverticulosis with no definite acute diverticulitis. 5. Aortic Atherosclerosis (ICD10-I70.0) and Emphysema (ICD10-J43.9).  She is still in pain.RUQ pain shooting down to RLQ. She has had pain since gyn robotic assisted bilateral salpingo oophorectomy with mini laparotomy for drainage, 11/10/20. According to pt, she was recommended to follow  with PCP.  Left shoulder pain and middle back pain.Exacerbated by movement and alleviated by rest.  Discharged from ER with Dx of UTI and started on Cephalexin, which she is still taking. She is having small bowel movements, last one yesterday after taking Mg citrate. The one before 6 days ago. Senakot is not helping. She has tried Miralax.  Negative for fever or skin rash. She has had some chills and decreased appetite. She has lost around 25 Lb since 09/2020. Pain is exacerbated by light touch with clothes and food intake.  Abdominal pain is a "little bit" better after passing gas or defecation. She has not noted melena, blood in stool,or urinary symptoms.  Pantoprazole dose was increased from 20 mg to 40 mg, Sucralfate and Zofran added. Nausea is not as bad now.  States that while in the ER it was noted that O2 sat dropped when she was dozing up, she was told she may have OSA. No known Hx. She follows with pulmonologist for COPD, Dr Lamonte Sakai.  Requesting Hydrocodone refill for headache.  Hydrocodone-Acetaminophen was re-prescribed on 10/29/2018 to treat generalized OA. 30 tabs usually last over a month. She has been taking med also for headaches. States that she has had headaches "all my life." Seasonal changes and allergies sometimes aggravate problem. Arthralgias and back pain.  Review of Systems  Constitutional: Positive for activity change, appetite change and fatigue.  HENT: Negative for mouth sores, nosebleeds, sore throat and trouble swallowing.   Eyes: Negative for redness and visual disturbance.  Respiratory: Negative for cough and wheezing.   Gastrointestinal: Positive for nausea. Negative for vomiting.  Genitourinary: Negative for dysuria and frequency.  Musculoskeletal: Positive for arthralgias, back pain and gait problem.  Skin: Negative for pallor and rash.  Neurological: Negative for syncope, facial asymmetry and weakness.  Rest of ROS, see pertinent positives  sand negatives in HPI  Current Outpatient Medications on File Prior to Visit  Medication Sig Dispense Refill  . acetaminophen (TYLENOL) 500 MG tablet Take 500-1,000 mg by mouth every 6 (six) hours as needed for headache.     . albuterol (PROVENTIL) (2.5 MG/3ML) 0.083% nebulizer solution Take 3 mLs (2.5 mg total) by nebulization every 6 (six) hours as needed for wheezing or shortness of breath. 300 mL 5  . albuterol (VENTOLIN HFA) 108 (90 Base) MCG/ACT inhaler Inhale 2 puffs into the lungs every 4 (four) hours as needed for wheezing. 1 each 3  . allopurinol (ZYLOPRIM) 100 MG tablet Take 1 tablet (100 mg total) by mouth daily. 90 tablet 2  . ALPRAZolam (XANAX) 0.25 MG tablet Take 0.25 mg by mouth daily as needed for anxiety.    Marland Kitchen amLODipine (NORVASC) 5 MG tablet Take 1 tablet (5 mg total) by mouth daily. 90 tablet 3  . ANORO ELLIPTA 62.5-25 MCG/INH AEPB TAKE 1 PUFF BY MOUTH EVERY DAY (Patient taking differently: Inhale 1 puff into the lungs daily.) 60 each 12  . APPLE CIDER VINEGAR PO Take 2,400 mg by mouth daily.    . ASPERCREME LIDOCAINE EX Apply 1 application topically daily as needed (pain).    Marland Kitchen aspirin EC 81 MG tablet Take 81 mg by mouth daily.    . calcium carbonate (CALCIUM 600) 600 MG TABS tablet Take 600 mg by mouth See admin instructions. Take 600 mg every 2 months    . Camphor-Eucalyptus-Menthol (VICKS VAPORUB EX) Apply 1 application topically daily as needed (congestion).    . cephALEXin (KEFLEX) 500 MG capsule Take 1 capsule (500 mg total) by mouth 4 (four) times daily. 28 capsule 0  . cetirizine (ZYRTEC) 10 MG tablet Take 10 mg by mouth daily.    . Cholecalciferol (VITAMIN D3) 50 MCG (2000 UT) TABS Take 4,000 Units by mouth daily.     . clopidogrel (PLAVIX) 75 MG tablet TAKE 1 TABLET (75 MG TOTAL) BY MOUTH DAILY WITH BREAKFAST. 90 tablet 3  . Cyanocobalamin (VITAMIN B-12) 5000 MCG SUBL Place 5,000 mcg under the tongue daily.     . fluticasone (FLONASE) 50 MCG/ACT nasal spray SPRAY 2  SPRAYS INTO EACH NOSTRIL EVERY DAY (Patient taking differently: Place 2 sprays into both nostrils daily.) 48 mL 1  . furosemide (LASIX) 40 MG tablet Take 40 mg by mouth daily as needed for fluid or edema.    Marland Kitchen guaiFENesin (MUCINEX) 600 MG 12 hr tablet Take 600 mg by mouth 2 (two) times daily as needed (congestion).    . isosorbide mononitrate (IMDUR) 30 MG 24 hr tablet Take 1 tablet (30 mg total) by mouth daily. 30 tablet 6  . losartan (COZAAR) 100 MG tablet TAKE 1 TABLET BY MOUTH EVERY DAY (Patient taking differently: Take 100 mg by mouth daily.) 90 tablet 3  . Menthol-Methyl Salicylate (SALONPAS PAIN RELIEF PATCH EX) Apply 1 patch topically daily as needed (pain).    . metoprolol succinate (TOPROL-XL) 100 MG 24 hr tablet TAKE 1 TABLET (100 MG TOTAL) BY MOUTH DAILY. TAKE WITH OR IMMEDIATELY FOLLOWING A MEAL. 90 tablet 0  . Misc Natural Products (TART CHERRY ADVANCED) CAPS Take 1,000 capsules by mouth daily.     . ondansetron (ZOFRAN ODT) 4 MG disintegrating tablet Take 1 tablet (4 mg  total) by mouth every 8 (eight) hours as needed for nausea or vomiting. 5 tablet 0  . oxymetazoline (AFRIN) 0.05 % nasal spray Place 1 spray into both nostrils 2 (two) times daily as needed for congestion.    . pantoprazole (PROTONIX) 40 MG tablet Take 1 tablet (40 mg total) by mouth daily. 30 tablet 0  . potassium chloride SA (KLOR-CON) 20 MEQ tablet Take 20 mEq by mouth daily.    . Simethicone (GAS-X PO) Take 2 tablets by mouth daily as needed (gas).    . sucralfate (CARAFATE) 1 GM/10ML suspension Take 10 mLs (1 g total) by mouth 4 (four) times daily -  with meals and at bedtime. 420 mL 0  . ezetimibe (ZETIA) 10 MG tablet Take 1 tablet (10 mg total) by mouth daily. 90 tablet 3  . [DISCONTINUED] nitroGLYCERIN (NITROSTAT) 0.4 MG SL tablet Place 1 tablet (0.4 mg total) under the tongue every 5 (five) minutes as needed for chest pain. (Patient not taking: Reported on 11/24/2020) 25 tablet 3   No current  facility-administered medications on file prior to visit.   Past Medical History:  Diagnosis Date  . Allergic rhinitis   . Anxiety   . Arthritis   . Bronchitis   . CAD (coronary artery disease)    1/19 PCI/DES to [place] for ISR, normal EF.   . Cancer ([place])    hx of precancerous cells in right breast   . Cervical dysplasia    unsure of procedure, possible "burning" in her late 56s  . CHF (congestive heart failure) ([place])    Echo 06/2019: EF 55-60, elevated LVEDP, normal RV SF, mild MAC, mild MR, trivial TR  . Complication of anesthesia   . COPD (chronic obstructive pulmonary disease) ([place])    early   . Dyspnea    with exertion   . Family history of adverse reaction to anesthesia    daughter- problems wiht n/v  . GERD (gastroesophageal reflux disease)   . Gout   . Headache(784.0)   . Heart murmur    hx of years ago   . Hyperlipidemia   . Hypertension   . Low back pain   . Menopausal syndrome   . Myocardial infarct ([place]) 2007   hx of  . Overactive bladder   . PONV (postoperative nausea and vomiting)    Allergies  Allergen Reactions  . Erythromycin Rash    But isn't certain  . Sulfamethoxazole Rash    Social History   Socioeconomic History  . Marital status: Married    Spouse name: Not on file  . Number of children: Not on file  . Years of education: Not on file  . Highest education level: Not on file  Occupational History  . Occupation: retired  Tobacco Use  . Smoking status: Former Smoker    Packs/day: 0.20    Years: 50.00    Pack years: 10.00    Types: Cigarettes    Quit date: 10/18/2015    Years since quitting: 5.1  . Smokeless tobacco: Never Used  . Tobacco comment: completely quit May of 2018; period of years she did not smoke   Vaping Use  . Vaping Use: Never used  Substance and Sexual Activity  . Alcohol use: Yes    Comment: seldom  . Drug use: No  . Sexual activity: Not Currently  Other Topics Concern  . Not on file  Social History Narrative  .  Not on file   Social Determinants of Health  Financial Resource Strain: Not on file  Food Insecurity: Not on file  Transportation Needs: Not on file  Physical Activity: Not on file  Stress: Not on file  Social Connections: Not on file   Vitals:   12/04/20 1446  BP: 130/80  Pulse: 99  Resp: 16  SpO2: 94%   Wt Readings from Last 3 Encounters:  12/04/20 212 lb (96.2 kg)  11/27/20 210 lb (95.3 kg)  11/24/20 211 lb (95.7 kg)   Body mass index is 35.28 kg/m.  Physical Exam Vitals and nursing note reviewed.  Constitutional:      General: She is not in acute distress.    Appearance: She is well-developed.  HENT:     Head: Normocephalic and atraumatic.     Mouth/Throat:     Mouth: Oropharynx is clear and moist. Mucous membranes are dry.  Eyes:     Conjunctiva/sclera: Conjunctivae normal.  Cardiovascular:     Rate and Rhythm: Normal rate and regular rhythm.     Heart sounds: No murmur heard.     Comments: Left 1 + pitting edema and right trace pitting LE edema. Pulmonary:     Effort: Pulmonary effort is normal. No respiratory distress.     Breath sounds: Normal breath sounds.  Abdominal:     Palpations: Abdomen is soft. There is no hepatomegaly or mass.     Tenderness: There is abdominal tenderness in the right upper quadrant, right lower quadrant, periumbilical area and suprapubic area. Right CVA tenderness: oulder pain.  Musculoskeletal:     Left shoulder: Tenderness present. No bony tenderness. Normal range of motion.     Lumbar back: No tenderness or bony tenderness.     Right lower leg: Edema present.     Left lower leg: Edema present.     Comments: Left shoulder: No deformity, edema, or erythema appreciated. Tenderness upon palpation of posterior aspect.  Luan Pulling' test positive, empty can supraspinatus test  neg, lift-Off Subscapularis test neg.    Lymphadenopathy:     Cervical: No cervical adenopathy.  Skin:    General: Skin is warm.     Findings: No  erythema or rash.  Neurological:     General: No focal deficit present.     Mental Status: She is alert and oriented to person, place, and time.     Deep Tendon Reflexes: Strength normal.     Comments: Unstable/untalgic gait, assisted by a cane.  Psychiatric:        Mood and Affect: Mood and affect normal.     Comments: Well groomed, good eye contact.   ASSESSMENT AND PLAN:  Tracy [name] was seen today for follow-up.  Diagnoses and all orders for this visit:  Constipation, unspecified constipation type Adequate fiber and fluid intake. Lactulose, low dose,recommended daily until a good bowel movement. Side effects discussed. Cleary instructed about warning signs.  -     lactulose (CHRONULAC) 10 GM/15ML solution; Take 15 mLs (10 g total) by mouth daily as needed for up to 15 days for mild constipation (daily until a good bowel movement.).  Abdominal pain, unspecified abdominal location We discussed possible etiologies. Abdominal/pelvis CT did not show acute process. Constipation can be a contributing factor. Bland diet, small portions, adequate hydration. Instructed about warning signs.  Left shoulder pain, unspecified chronicity No hx of trauma, so I do not think imaging is needed today. ? Tendinitis, OA. Because of constipation ,I do not recommend continuing Hydrocodone for pain management.  Essential hypertension BP adequately  controlled. Continue Amlodipine 5 mg daily,Imdur 3o mg daily,Metoprolol succinate 100 mg daily,and Losartan 100 mg daily. Low salt diet.  CKD (chronic kidney disease), stage III (HCC) Cr 1.2, e GFR upper 40's. Continue adequate hydration,low salt diet,and avoidance of NSAID's. Continue Losartan 100 mg daily.  LOW BACK PAIN Chronic. Could been aggravated by constipation. I do not think imaging is needed today. Tylenol 500 mg 3-4 times daily is the safest option for her.  Chronic nonintractable headache, unspecified headache  type Hydrocodone-Acetaminophen is not to treat headaches. It seems to be stable. Tylenol recommended for now but can also cause rebound headache. Headache diary to bring next visit.  Spent 44 minutes.  During this time history was obtained and documented, examination was performed, prior labs/imaging reviewed, and assessment/plan discussed.  Return in about 3 weeks (around 12/25/2020) for 3-4 weeks: constipation,abd pain,HA.   Nael Petrosyan G. [name], MD  [facility]. [place] office.     A few things to remember from today's visit:  Abdominal pain, unspecified abdominal location  Constipation, unspecified constipation type - Plan: lactulose (CHRONULAC) 10 GM/15ML solution  If you need refills please call your pharmacy. Do not use My Chart to request refills or for acute issues that ne Constipation, Adult Constipation is when a person has trouble pooping (having a bowel movement). When you have this condition, you may poop fewer than 3 times a week. Your poop (stool) may also be dry, hard, or bigger than normal. Follow these instructions at home: Eating and drinking  Eat foods that have a lot of fiber, such as: ? Fresh fruits and vegetables. ? Whole grains. ? Beans.  Eat less of foods that are low in fiber and high in fat and sugar, such as: ? [place] fries. ? Hamburgers. ? Cookies. ? Candy. ? Soda.  Drink enough fluid to keep your pee (urine) pale yellow.   General instructions  Exercise regularly or as told by your doctor. Try to do 150 minutes of exercise each week.  Go to the restroom when you feel like you need to poop. Do not hold it in.  Take over-the-counter and prescription medicines only as told by your doctor. These include any fiber supplements.  When you poop: ? Do deep breathing while relaxing your lower belly (abdomen). ? Relax your pelvic floor. The pelvic floor is a group of muscles that support the rectum, bladder, and intestines (as well as  the uterus in women).  Watch your condition for any changes. Tell your doctor if you notice any.  Keep all follow-up visits as told by your doctor. This is important. Contact a doctor if:  You have pain that gets worse.  You have a fever.  You have not pooped for 4 days.  You vomit.  You are not hungry.  You lose weight.  You are bleeding from the opening of the butt (anus).  You have thin, pencil-like poop. Get help right away if:  You have a fever, and your symptoms suddenly get worse.  You leak poop or have blood in your poop.  Your belly feels hard or bigger than normal (bloated).  You have very bad belly pain.  You feel dizzy or you faint. Summary  Constipation is when a person poops fewer than 3 times a week, has trouble pooping, or has poop that is dry, hard, or bigger than normal.  Eat foods that have a lot of fiber.  Drink enough fluid to keep your pee (urine) pale yellow.  Take  over-the-counter and prescription medicines only as told by your doctor. These include any fiber supplements. This information is not intended to replace advice given to you by your health care provider. Make sure you discuss any questions you have with your health care provider. Document Revised: 08/21/2019 Document Reviewed: 08/21/2019 Elsevier Patient Education  2021 [place]. d immediate attention.   Lactulose daily until a good bowel movement. Bland food and small portions. Adequate hydration and fiber intake.  Headache diary.  Please be sure medication list is accurate. If a new problem present, please set up appointment sooner than planned today.

## 2020-12-07 NOTE — Telephone Encounter (Signed)
Spoke with pt regarding referral for lung cancer screening. PT had CT angio Chest on 11/28/20. Will check with Eric Form, NP to see when pt will need to start lung cancer screening. Pt is aware I will call her back.

## 2020-12-10 ENCOUNTER — Encounter: Payer: Self-pay | Admitting: Acute Care

## 2020-12-10 NOTE — Telephone Encounter (Signed)
Spoke with pt and advised that per Eric Form, NP we will start pt in lung cancer screening in 6 months. PT is aware that I will call her back closer to that time to get her scheduled.

## 2020-12-12 ENCOUNTER — Other Ambulatory Visit: Payer: Self-pay | Admitting: Family Medicine

## 2020-12-12 DIAGNOSIS — K5900 Constipation, unspecified: Secondary | ICD-10-CM

## 2020-12-14 ENCOUNTER — Telehealth: Payer: Self-pay

## 2020-12-14 DIAGNOSIS — R1900 Intra-abdominal and pelvic swelling, mass and lump, unspecified site: Secondary | ICD-10-CM

## 2020-12-14 DIAGNOSIS — D270 Benign neoplasm of right ovary: Secondary | ICD-10-CM

## 2020-12-14 NOTE — Telephone Encounter (Signed)
TC to review MU.  No answer, left message to return call.

## 2020-12-15 ENCOUNTER — Other Ambulatory Visit: Payer: Self-pay

## 2020-12-15 ENCOUNTER — Encounter: Payer: Self-pay | Admitting: Gynecologic Oncology

## 2020-12-15 ENCOUNTER — Inpatient Hospital Stay: Payer: Medicare Other | Attending: Gynecologic Oncology | Admitting: Gynecologic Oncology

## 2020-12-15 VITALS — BP 148/91 | HR 107 | Temp 97.1°F | Resp 20 | Wt 214.0 lb

## 2020-12-15 DIAGNOSIS — K5909 Other constipation: Secondary | ICD-10-CM

## 2020-12-15 DIAGNOSIS — K5900 Constipation, unspecified: Secondary | ICD-10-CM

## 2020-12-15 DIAGNOSIS — D271 Benign neoplasm of left ovary: Secondary | ICD-10-CM

## 2020-12-15 DIAGNOSIS — Z90722 Acquired absence of ovaries, bilateral: Secondary | ICD-10-CM

## 2020-12-15 DIAGNOSIS — D270 Benign neoplasm of right ovary: Secondary | ICD-10-CM

## 2020-12-15 DIAGNOSIS — R1900 Intra-abdominal and pelvic swelling, mass and lump, unspecified site: Secondary | ICD-10-CM

## 2020-12-15 NOTE — Progress Notes (Signed)
Gynecologic Oncology Return Clinic Visit  12/15/20  Reason for Visit: post-op follow-up  Treatment History: 1/25: Robotic BSO and removal of left labial mass for 18cm cystic mass, pathology showed serous cystadenoma and benign hidradenoma papilliferum  Interval History: Patient presents today for follow-up.  Overall, she has had some improvement in her symptoms since I saw her last.  She saw her primary care provider after her last visit with me and ultimately was seen in the emergency department.  Her labs had improved and she was ruled out for pancreatitis.  She continues to struggle with her bowel function.  She has started regular medication to improve her bowel movements but continues to have some constipation.  She is tolerating oral intake without nausea or emesis.  She continues to eat foods that are higher in fiber.  She denies any urinary symptoms.  She reports continued intermittent upper abdominal pain which she describes as feeling like she has a girdle around her midsection.  She uses Tylenol extra strength as needed which helps take the edge off of her pain.  She reports less pain associated with her incisions.  She denies any vaginal bleeding or discharge.  Past Medical/Surgical History: Past Medical History:  Diagnosis Date  . Allergic rhinitis   . Anxiety   . Arthritis   . Bronchitis   . CAD (coronary artery disease)    1/19 PCI/DES to [place] for ISR, normal EF.   . Cancer ([place])    hx of precancerous cells in right breast   . Cervical dysplasia    unsure of procedure, possible "burning" in her late 70s  . CHF (congestive heart failure) ([place])    Echo 06/2019: EF 55-60, elevated LVEDP, normal RV SF, mild MAC, mild MR, trivial TR  . Complication of anesthesia   . COPD (chronic obstructive pulmonary disease) ([place])    early   . Dyspnea    with exertion   . Family history of adverse reaction to anesthesia    daughter- problems wiht n/v  . GERD (gastroesophageal reflux  disease)   . Gout   . Headache(784.0)   . Heart murmur    hx of years ago   . Hyperlipidemia   . Hypertension   . Low back pain   . Menopausal syndrome   . Myocardial infarct ([place]) 2007   hx of  . Overactive bladder   . PONV (postoperative nausea and vomiting)     Past Surgical History:  Procedure Laterality Date  . ANGIOPLASTY     stent 2007  . BREAST LUMPECTOMY WITH RADIOACTIVE SEED LOCALIZATION Right 04/28/2020   Procedure: RIGHT BREAST LUMPECTOMY X 2  WITH RADIOACTIVE SEED LOCALIZATION;  Surgeon: Donnie Mesa, MD;  Location: [facility];  Service: General;  Laterality: Right;  LMA  . CARDIAC CATHETERIZATION    . COLONOSCOPY    . CORONARY STENT INTERVENTION N/A 11/06/2017   Procedure: CORONARY STENT INTERVENTION;  Surgeon: [name], MD;  Location: [facility];  Service: Cardiovascular;  Laterality: N/A;  . LEFT HEART CATH AND CORONARY ANGIOGRAPHY N/A 11/06/2017   Procedure: LEFT HEART CATH AND CORONARY ANGIOGRAPHY;  Surgeon: [name], MD;  Location: [facility];  Service: Cardiovascular;  Laterality: N/A;  . LEFT HEART CATH AND CORONARY ANGIOGRAPHY N/A 07/24/2019   Procedure: LEFT HEART CATH AND CORONARY ANGIOGRAPHY;  Surgeon: Burnell Blanks, MD;  Location: [facility];  Service: Cardiovascular;  Laterality: N/A;  . RIGHT/LEFT HEART CATH AND CORONARY ANGIOGRAPHY N/A  09/29/2020   Procedure: RIGHT/LEFT HEART CATH AND CORONARY ANGIOGRAPHY;  Surgeon: Belva Crome, MD;  Location: [facility];  Service: Cardiovascular;  Laterality: N/A;  . ROBOTIC ASSISTED BILATERAL SALPINGO OOPHERECTOMY Bilateral 11/10/2020   Procedure: XI ROBOTIC ASSISTED BILATERAL SALPINGO OOPHORECTOMY WITH MINI LAPAROTOMY FOR DRAINAGE;  Surgeon: Lafonda Mosses, MD;  Location: [facility];  Service: Gynecology;  Laterality: Bilateral;  MINI LAP FIRST  . TONSILLECTOMY    . VULVECTOMY N/A 11/10/2020   Procedure: WIDE EXCISION VULVECTOMY;  Surgeon:  Lafonda Mosses, MD;  Location: [facility];  Service: Gynecology;  Laterality: N/A;    Family History  Adopted: Yes  Problem Relation Age of Onset  . Colon cancer Mother 66  . Other Other        patient is adopted  . Diabetes Daughter        type 2  . Stomach cancer Neg Hx     Social History   Socioeconomic History  . Marital status: Married    Spouse name: Not on file  . Number of children: 2  . Years of education: Not on file  . Highest education level: Not on file  Occupational History  . Occupation: retired  Tobacco Use  . Smoking status: Former Smoker    Packs/day: 1.25    Years: 52.00    Pack years: 65.00    Types: Cigarettes    Quit date: 2018    Years since quitting: 4.1  . Smokeless tobacco: Never Used  . Tobacco comment: completely quit May of 2018; period of years she did not smoke   Vaping Use  . Vaping Use: Never used  Substance and Sexual Activity  . Alcohol use: Yes    Comment: seldom  . Drug use: No  . Sexual activity: Not Currently  Other Topics Concern  . Not on file  Social History Narrative  . Not on file   Social Determinants of Health   Financial Resource Strain: Not on file  Food Insecurity: Not on file  Transportation Needs: Not on file  Physical Activity: Not on file  Stress: Not on file  Social Connections: Not on file    Current Medications:  Current Outpatient Medications:  .  acetaminophen (TYLENOL) 500 MG tablet, Take 500-1,000 mg by mouth every 6 (six) hours as needed for headache. , Disp: , Rfl:  .  albuterol (PROVENTIL) (2.5 MG/3ML) 0.083% nebulizer solution, Take 3 mLs (2.5 mg total) by nebulization every 6 (six) hours as needed for wheezing or shortness of breath., Disp: 300 mL, Rfl: 5 .  albuterol (VENTOLIN HFA) 108 (90 Base) MCG/ACT inhaler, Inhale 2 puffs into the lungs every 4 (four) hours as needed for wheezing., Disp: 1 each, Rfl: 3 .  allopurinol (ZYLOPRIM) 100 MG tablet, Take 1 tablet (100 mg total) by mouth  daily., Disp: 90 tablet, Rfl: 2 .  ALPRAZolam (XANAX) 0.25 MG tablet, Take 0.25 mg by mouth daily as needed for anxiety., Disp: , Rfl:  .  amLODipine (NORVASC) 5 MG tablet, Take 1 tablet (5 mg total) by mouth daily., Disp: 90 tablet, Rfl: 3 .  ANORO ELLIPTA 62.5-25 MCG/INH AEPB, TAKE 1 PUFF BY MOUTH EVERY DAY (Patient taking differently: Inhale 1 puff into the lungs daily.), Disp: 60 each, Rfl: 12 .  APPLE CIDER VINEGAR PO, Take 2,400 mg by mouth daily., Disp: , Rfl:  .  ASPERCREME LIDOCAINE EX, Apply 1 application topically daily as needed (pain)., Disp: , Rfl:  .  aspirin EC 81  MG tablet, Take 81 mg by mouth daily., Disp: , Rfl:  .  calcium carbonate (CALCIUM 600) 600 MG TABS tablet, Take 600 mg by mouth See admin instructions. Take 600 mg every 2 months, Disp: , Rfl:  .  Camphor-Eucalyptus-Menthol (VICKS VAPORUB EX), Apply 1 application topically daily as needed (congestion)., Disp: , Rfl:  .  cetirizine (ZYRTEC) 10 MG tablet, Take 10 mg by mouth daily., Disp: , Rfl:  .  Cholecalciferol (VITAMIN D3) 50 MCG (2000 UT) TABS, Take 4,000 Units by mouth daily. , Disp: , Rfl:  .  clopidogrel (PLAVIX) 75 MG tablet, TAKE 1 TABLET (75 MG TOTAL) BY MOUTH DAILY WITH BREAKFAST., Disp: 90 tablet, Rfl: 3 .  Cyanocobalamin (VITAMIN B-12) 5000 MCG SUBL, Place 5,000 mcg under the tongue daily. , Disp: , Rfl:  .  fluticasone (FLONASE) 50 MCG/ACT nasal spray, SPRAY 2 SPRAYS INTO EACH NOSTRIL EVERY DAY (Patient taking differently: Place 2 sprays into both nostrils daily.), Disp: 48 mL, Rfl: 1 .  furosemide (LASIX) 40 MG tablet, Take 40 mg by mouth daily as needed for fluid or edema., Disp: , Rfl:  .  guaiFENesin (MUCINEX) 600 MG 12 hr tablet, Take 600 mg by mouth 2 (two) times daily as needed (congestion)., Disp: , Rfl:  .  isosorbide mononitrate (IMDUR) 30 MG 24 hr tablet, Take 1 tablet (30 mg total) by mouth daily., Disp: 30 tablet, Rfl: 6 .  lactulose (CHRONULAC) 10 GM/15ML solution, TAKE 15 MLS (10 G TOTAL) BY  MOUTH DAILY AS NEEDED FOR UP TO 15 DAYS FOR MILD CONSTIPATION (DAILY UNTIL A GOOD BOWEL MOVEMENT.)., Disp: 236 mL, Rfl: 0 .  losartan (COZAAR) 100 MG tablet, TAKE 1 TABLET BY MOUTH EVERY DAY (Patient taking differently: Take 100 mg by mouth daily.), Disp: 90 tablet, Rfl: 3 .  Menthol-Methyl Salicylate (SALONPAS PAIN RELIEF PATCH EX), Apply 1 patch topically daily as needed (pain)., Disp: , Rfl:  .  metoprolol succinate (TOPROL-XL) 100 MG 24 hr tablet, TAKE 1 TABLET (100 MG TOTAL) BY MOUTH DAILY. TAKE WITH OR IMMEDIATELY FOLLOWING A MEAL., Disp: 90 tablet, Rfl: 0 .  Misc Natural Products (TART CHERRY ADVANCED) CAPS, Take 1,000 capsules by mouth daily. , Disp: , Rfl:  .  ondansetron (ZOFRAN ODT) 4 MG disintegrating tablet, Take 1 tablet (4 mg total) by mouth every 8 (eight) hours as needed for nausea or vomiting., Disp: 5 tablet, Rfl: 0 .  oxymetazoline (AFRIN) 0.05 % nasal spray, Place 1 spray into both nostrils 2 (two) times daily as needed for congestion., Disp: , Rfl:  .  pantoprazole (PROTONIX) 40 MG tablet, Take 1 tablet (40 mg total) by mouth daily., Disp: 30 tablet, Rfl: 0 .  potassium chloride SA (KLOR-CON) 20 MEQ tablet, Take 20 mEq by mouth daily., Disp: , Rfl:  .  Simethicone (GAS-X PO), Take 2 tablets by mouth daily as needed (gas)., Disp: , Rfl:  .  sucralfate (CARAFATE) 1 GM/10ML suspension, Take 10 mLs (1 g total) by mouth 4 (four) times daily -  with meals and at bedtime., Disp: 420 mL, Rfl: 0 .  ezetimibe (ZETIA) 10 MG tablet, Take 1 tablet (10 mg total) by mouth daily., Disp: 90 tablet, Rfl: 3  Review of Systems: Pertinent positives include mouth sores, abdominal distention, abdominal pain, constipation, joint pain, back pain, difficulty walking. Denies appetite changes, fevers, chills, fatigue, unexplained weight changes. Denies hearing loss, neck lumps or masses, ringing in ears or voice changes. Denies cough or wheezing.  Denies shortness of breath. Denies  chest pain or  palpitations. Denies leg swelling. Denies blood in stools, diarrhea, nausea, vomiting, or early satiety. Denies pain with intercourse, dysuria, frequency, hematuria or incontinence. Denies hot flashes, pelvic pain, vaginal bleeding or vaginal discharge.   Denies muscle pain/cramps. Denies itching, rash, or wounds. Denies dizziness, headaches, numbness or seizures. Denies swollen lymph nodes or glands, denies easy bruising or bleeding. Denies anxiety, depression, confusion, or decreased concentration.  Physical Exam: BP (!) 148/91 (BP Location: Left Arm, Patient Position: Sitting)   Pulse (!) 107   Temp (!) 97.1 F (36.2 C) (Tympanic)   Resp 20   Wt 214 lb (97.1 kg)   SpO2 97% Comment: RA  BMI 35.61 kg/m  General: Alert, oriented, no acute distress. HEENT: Posterior oropharynx clear, sclera anicteric. Chest: Unlabored breathing on room air. Abdomen: Obese, soft, nontender.  Normoactive bowel sounds.  No masses or hepatosplenomegaly appreciated.  Well-healed laparoscopic incisions.  Small amount of Dermabond removed from midline mini lap.  No erythema, induration, or exudate.  This incision site is now well-healed.  Laboratory & Radiologic Studies: None new  Assessment & Plan: Tracy [name] is a 75 y.o. woman s/p robotic BSO in late January for large pelvic mass with benign final pathology presenting for postop follow-up.  Patient is meeting postoperative milestones.  We discussed that she can begin increasing lifting at the 6-week postop mark.  I reviewed again with her pathology from surgery and discuss findings at the time of surgery.  She was also given a handout of her formal pathology report.  She continues to have some abdominal pain and upper abdominal symptoms, somewhat improved since her last visit.  Given her lab testing previously, I had recommended she see her primary care provider given suspicion she may have early pancreatitis.  She was ultimately seen in the  emergency department and this was thought not to be the case.  She has since been started on a more aggressive bowel regimen and has had some improvement in her pain.  I suspect some bowel dysfunction postop is component of her pain.  She continues to work on this, I recommended that we have a phone visit in a couple of months to assure that her symptoms continue to improve.  28 minutes of total time was spent for this patient encounter, including preparation, face-to-face counseling with the patient and coordination of care, and documentation of the encounter.  Jeral Pinch, MD  Division of Gynecologic Oncology  Department of Obstetrics and Gynecology  [facility]

## 2020-12-15 NOTE — Patient Instructions (Signed)
Your incisions look great today! Keep working on getting your bowels moving more normally. We will have a phone visit in another 6 weeks or so to make sure that you are continuing to improve.

## 2020-12-24 ENCOUNTER — Other Ambulatory Visit: Payer: Self-pay | Admitting: Family Medicine

## 2020-12-24 ENCOUNTER — Other Ambulatory Visit: Payer: Self-pay

## 2020-12-24 DIAGNOSIS — K5900 Constipation, unspecified: Secondary | ICD-10-CM

## 2020-12-25 ENCOUNTER — Ambulatory Visit (INDEPENDENT_AMBULATORY_CARE_PROVIDER_SITE_OTHER): Payer: Medicare Other | Admitting: Family Medicine

## 2020-12-25 ENCOUNTER — Encounter: Payer: Self-pay | Admitting: Family Medicine

## 2020-12-25 VITALS — BP 124/70 | HR 112 | Resp 16 | Ht 65.0 in | Wt 204.0 lb

## 2020-12-25 DIAGNOSIS — I25119 Atherosclerotic heart disease of native coronary artery with unspecified angina pectoris: Secondary | ICD-10-CM | POA: Diagnosis not present

## 2020-12-25 DIAGNOSIS — J301 Allergic rhinitis due to pollen: Secondary | ICD-10-CM | POA: Diagnosis not present

## 2020-12-25 DIAGNOSIS — M159 Polyosteoarthritis, unspecified: Secondary | ICD-10-CM

## 2020-12-25 DIAGNOSIS — K219 Gastro-esophageal reflux disease without esophagitis: Secondary | ICD-10-CM

## 2020-12-25 DIAGNOSIS — I5032 Chronic diastolic (congestive) heart failure: Secondary | ICD-10-CM

## 2020-12-25 DIAGNOSIS — G43109 Migraine with aura, not intractable, without status migrainosus: Secondary | ICD-10-CM

## 2020-12-25 DIAGNOSIS — G894 Chronic pain syndrome: Secondary | ICD-10-CM | POA: Insufficient documentation

## 2020-12-25 DIAGNOSIS — I10 Essential (primary) hypertension: Secondary | ICD-10-CM

## 2020-12-25 DIAGNOSIS — I2510 Atherosclerotic heart disease of native coronary artery without angina pectoris: Secondary | ICD-10-CM | POA: Diagnosis not present

## 2020-12-25 DIAGNOSIS — R7303 Prediabetes: Secondary | ICD-10-CM | POA: Diagnosis not present

## 2020-12-25 DIAGNOSIS — R739 Hyperglycemia, unspecified: Secondary | ICD-10-CM

## 2020-12-25 DIAGNOSIS — R109 Unspecified abdominal pain: Secondary | ICD-10-CM

## 2020-12-25 DIAGNOSIS — Z1211 Encounter for screening for malignant neoplasm of colon: Secondary | ICD-10-CM | POA: Diagnosis not present

## 2020-12-25 LAB — POCT GLYCOSYLATED HEMOGLOBIN (HGB A1C): HbA1c, POC (prediabetic range): 5.9 % (ref 5.7–6.4)

## 2020-12-25 MED ORDER — HYDROCODONE-ACETAMINOPHEN 5-325 MG PO TABS: 1.0000 | ORAL_TABLET | Freq: Every day | ORAL | 0 refills | Status: DC | PRN

## 2020-12-25 MED ORDER — PANTOPRAZOLE SODIUM 40 MG PO TBEC: 40.0000 mg | DELAYED_RELEASE_TABLET | Freq: Every day | ORAL | 0 refills | Status: DC

## 2020-12-25 NOTE — Assessment & Plan Note (Addendum)
BP adequately controlled. Continue amlodipine 5 mg daily, Metoprolol Succinate 100 mg,losartan 100 mg, and Imdur 30 mg daily. Continue low-salt diet.

## 2020-12-25 NOTE — Assessment & Plan Note (Addendum)
We discussed benefits of statin. She is not interested in trying a statin again due to myalgias/arthralgias. She is a good candidate for Praluent. Continue Zetia 10 mg daily. Following with cardiologist.

## 2020-12-25 NOTE — Assessment & Plan Note (Signed)
This problem seems to be a contributing to some of her headaches. Problem seems to be well controlled with Zyrtec 10 mg daily and Flonase nasal spray. Nasal saline irrigations as needed.

## 2020-12-25 NOTE — Progress Notes (Signed)
HPI: Tracy [name] is a 75 y.o. female, who is here today for follow up.   She was last seen on 12/04/20 for abdominal pain after ER visit. She has had abdominal pain that started after gyn robotic surgery. Thought pain was aggravated by constipation, Lactulose was recommended for short period of time. Constipation has improved. Since her last visit she saw her gyn,next follow up on 01/26/21.  Abdominal/pelvic CT 11/24/20: 1. No acute intra-abdominal or pelvic pathology. 2. Interval resection of the previously seen pelvic mass. No fluid collection or abscess. 3. Severe sigmoid diverticulosis. No bowel obstruction. 4. Aortic Atherosclerosis (ICD10-I70.0).  -Still having abdominal pain, upper mainly but improved. In the ER she was given a Rx for Pantoprazole 40 mg to take daily, she needs a refills. She has been on Pantoprazole 20 mg daily for years to treat GERD. She is not having heartburn.  Bloating sensation and occasionally nausea, exacerbated by food intake. She has decreased food intake due to pain, has noted wt loss.  Gas X helps with pain as well as defecation.  She is due for colonoscopy, needs GI referral. Just found out that her biologic mother had colon cancer.  She wonders if she has a UTI because discomfort on perineal area with urination. Having problem since surgery on 11/10/20. Treated for UTI on 11/27/20. Problem is intermittent. Negative for fever,dysuria, urinary frequency,urgency,or gross hematuria.  -Chronic pain/OA: I have prescribed Hydrocodone-Acetaminophen 5-325 mg, last Rx on 04/13/20. She had Oxycodone from surgeon after gyn surgery. Last visit she was requesting Rx to take for headaches.  -Hx of migraines, 1-2 per month, not as severe as they were years ago. Left fronto-temporal, preceded by visual aura, wavy lines in visual field for 10-15 min before headache onset.Associated nausea and photophobia.  She has other headaches, frontal,  pressure like, attributed to allergies. If she takes her zyrtec 10 mg daily this headaches improve. Exacerbated by weather changes.  -Knee pain sometimes is "so bad" she can not walk.  Exacerbated by prolonged walking/standing,and movement after prolonged rest. Rainy days also aggravate problem.  She follows with ortho and knee replacement was discussed but she prefers to hold on for now. Knee injections in the past. Tylenol has helped some. IP and shoulder pain.  -CAD and HLD: According to pt,Atorvastatin 40 mg was stopped by Dr Tracy [name] because elevated transaminases. Joint pain improved after stopping medication. She is on Zetia 10 mg daily.  Mild tachycardia today. HFpEF: Negative for orthopnea and PND. Echo LVEF 55-60%,diastolic dysfunction.  Negative for CP,palpitation,and diaphoresis. SOB is chronic and improved. Next appt with cardio 01/11/21.  Lab Results  Component Value Date   CHOL 128 03/04/2020   HDL 36 (L) 03/04/2020   LDLCALC 62 03/04/2020   LDLDIRECT 111.0 01/05/2015   TRIG 180 (H) 03/04/2020   CHOLHDL 3.6 03/04/2020    Lab Results  Component Value Date   ALT 30 11/27/2020   AST 33 11/27/2020   ALKPHOS 85 11/27/2020   BILITOT 0.5 11/27/2020   She does not drink alcohol.  Review of Systems  Constitutional: Positive for activity change and fatigue.  HENT: Negative for mouth sores, nosebleeds, sore throat and trouble swallowing.   Respiratory: Negative for cough and wheezing.   Musculoskeletal: Positive for arthralgias and gait problem.  Skin: Negative for pallor and rash.  Allergic/Immunologic: Positive for environmental allergies.  Neurological: Negative for syncope, facial asymmetry and weakness.  Rest of ROS, see pertinent positives sand negatives in HPI  Current Outpatient Medications on File Prior to Visit  Medication Sig Dispense Refill  . acetaminophen (TYLENOL) 500 MG tablet Take 500-1,000 mg by mouth every 6 (six) hours as needed for  headache.     . albuterol (PROVENTIL) (2.5 MG/3ML) 0.083% nebulizer solution Take 3 mLs (2.5 mg total) by nebulization every 6 (six) hours as needed for wheezing or shortness of breath. 300 mL 5  . albuterol (VENTOLIN HFA) 108 (90 Base) MCG/ACT inhaler Inhale 2 puffs into the lungs every 4 (four) hours as needed for wheezing. 1 each 3  . allopurinol (ZYLOPRIM) 100 MG tablet Take 1 tablet (100 mg total) by mouth daily. 90 tablet 2  . amLODipine (NORVASC) 5 MG tablet Take 1 tablet (5 mg total) by mouth daily. 90 tablet 3  . ANORO ELLIPTA 62.5-25 MCG/INH AEPB TAKE 1 PUFF BY MOUTH EVERY DAY (Patient taking differently: Inhale 1 puff into the lungs daily.) 60 each 12  . APPLE CIDER VINEGAR PO Take 2,400 mg by mouth daily.    . ASPERCREME LIDOCAINE EX Apply 1 application topically daily as needed (pain).    Marland Kitchen aspirin EC 81 MG tablet Take 81 mg by mouth daily.    . calcium carbonate (CALCIUM 600) 600 MG TABS tablet Take 600 mg by mouth See admin instructions. Take 600 mg every 2 months    . Camphor-Eucalyptus-Menthol (VICKS VAPORUB EX) Apply 1 application topically daily as needed (congestion).    . cetirizine (ZYRTEC) 10 MG tablet Take 10 mg by mouth daily.    . Cholecalciferol (VITAMIN D3) 50 MCG (2000 UT) TABS Take 4,000 Units by mouth daily.     . clopidogrel (PLAVIX) 75 MG tablet TAKE 1 TABLET (75 MG TOTAL) BY MOUTH DAILY WITH BREAKFAST. 90 tablet 3  . Cyanocobalamin (VITAMIN B-12) 5000 MCG SUBL Place 5,000 mcg under the tongue daily.     . fluticasone (FLONASE) 50 MCG/ACT nasal spray SPRAY 2 SPRAYS INTO EACH NOSTRIL EVERY DAY (Patient taking differently: Place 2 sprays into both nostrils daily.) 48 mL 1  . furosemide (LASIX) 40 MG tablet Take 40 mg by mouth daily as needed for fluid or edema.    Marland Kitchen guaiFENesin (MUCINEX) 600 MG 12 hr tablet Take 600 mg by mouth 2 (two) times daily as needed (congestion).    . isosorbide mononitrate (IMDUR) 30 MG 24 hr tablet Take 1 tablet (30 mg total) by mouth daily.  30 tablet 6  . lactulose (CHRONULAC) 10 GM/15ML solution TAKE 15 MLS (10 G TOTAL) BY MOUTH DAILY AS NEEDED FOR UP TO 15 DAYS FOR MILD CONSTIPATION (DAILY UNTIL A GOOD BOWEL MOVEMENT.). 236 mL 0  . losartan (COZAAR) 100 MG tablet TAKE 1 TABLET BY MOUTH EVERY DAY (Patient taking differently: Take 100 mg by mouth daily.) 90 tablet 3  . Menthol-Methyl Salicylate (SALONPAS PAIN RELIEF PATCH EX) Apply 1 patch topically daily as needed (pain).    . metoprolol succinate (TOPROL-XL) 100 MG 24 hr tablet TAKE 1 TABLET (100 MG TOTAL) BY MOUTH DAILY. TAKE WITH OR IMMEDIATELY FOLLOWING A MEAL. 90 tablet 0  . Misc Natural Products (TART CHERRY ADVANCED) CAPS Take 1,000 capsules by mouth daily.     . ondansetron (ZOFRAN ODT) 4 MG disintegrating tablet Take 1 tablet (4 mg total) by mouth every 8 (eight) hours as needed for nausea or vomiting. 5 tablet 0  . oxymetazoline (AFRIN) 0.05 % nasal spray Place 1 spray into both nostrils 2 (two) times daily as needed for congestion.    . potassium  chloride SA (KLOR-CON) 20 MEQ tablet Take 20 mEq by mouth daily.    . Simethicone (GAS-X PO) Take 2 tablets by mouth daily as needed (gas).    . sucralfate (CARAFATE) 1 GM/10ML suspension Take 10 mLs (1 g total) by mouth 4 (four) times daily -  with meals and at bedtime. 420 mL 0  . ezetimibe (ZETIA) 10 MG tablet Take 1 tablet (10 mg total) by mouth daily. 90 tablet 3  . [DISCONTINUED] nitroGLYCERIN (NITROSTAT) 0.4 MG SL tablet Place 1 tablet (0.4 mg total) under the tongue every 5 (five) minutes as needed for chest pain. 25 tablet 3   No current facility-administered medications on file prior to visit.   Past Medical History:  Diagnosis Date  . Allergic rhinitis   . Anxiety   . Arthritis   . Bronchitis   . CAD (coronary artery disease)    1/19 PCI/DES to [facility] for ISR, normal EF.   . Cancer ([place])    hx of precancerous cells in right breast   . Cervical dysplasia    unsure of procedure, possible "burning" in her late 86s   . CHF (congestive heart failure) ([place])    Echo 06/2019: EF 55-60, elevated LVEDP, normal RV SF, mild MAC, mild MR, trivial TR  . Complication of anesthesia   . COPD (chronic obstructive pulmonary disease) ([place])    early   . Dyspnea    with exertion   . Family history of adverse reaction to anesthesia    daughter- problems wiht n/v  . GERD (gastroesophageal reflux disease)   . Gout   . Headache(784.0)   . Heart murmur    hx of years ago   . Hyperlipidemia   . Hypertension   . Low back pain   . Menopausal syndrome   . Myocardial infarct ([place]) 2007   hx of  . Overactive bladder   . PONV (postoperative nausea and vomiting)    Allergies  Allergen Reactions  . Erythromycin Rash    But isn't certain  . Sulfamethoxazole Rash    Social History   Socioeconomic History  . Marital status: Married    Spouse name: Not on file  . Number of children: 2  . Years of education: Not on file  . Highest education level: Not on file  Occupational History  . Occupation: retired  Tobacco Use  . Smoking status: Former Smoker    Packs/day: 1.25    Years: 52.00    Pack years: 65.00    Types: Cigarettes    Quit date: 2018    Years since quitting: 4.1  . Smokeless tobacco: Never Used  . Tobacco comment: completely quit May of 2018; period of years she did not smoke   Vaping Use  . Vaping Use: Never used  Substance and Sexual Activity  . Alcohol use: Yes    Comment: seldom  . Drug use: No  . Sexual activity: Not Currently  Other Topics Concern  . Not on file  Social History Narrative  . Not on file   Social Determinants of Health   Financial Resource Strain: Not on file  Food Insecurity: Not on file  Transportation Needs: Not on file  Physical Activity: Not on file  Stress: Not on file  Social Connections: Not on file    Vitals:   12/25/20 1113 12/25/20 1154  BP: 124/70   Pulse: (!) 126 (!) 112  Resp: 16   SpO2: 94%    Body mass index is 33.95  kg/m.  Physical  Exam Vitals and nursing note reviewed.  Constitutional:      General: She is not in acute distress.    Appearance: She is well-developed.  HENT:     Head: Normocephalic and atraumatic.  Eyes:     Conjunctiva/sclera: Conjunctivae normal.  Cardiovascular:     Rate and Rhythm: Regular rhythm. Tachycardia present.     Heart sounds: No murmur heard.     Comments: LLE lymphedema. Trace pitting LE edema, bilateral. DP pulse present, bilateral. Pulmonary:     Effort: Pulmonary effort is normal. No respiratory distress.     Breath sounds: Normal breath sounds.  Abdominal:     Palpations: Abdomen is soft. There is no hepatomegaly or mass.     Tenderness: There is abdominal tenderness in the epigastric area and left upper quadrant.  Musculoskeletal:     Right hand: Deformity present.     Left hand: Deformity present.     Comments: Heberden's node (DIP) bilateral. No signs of synovitis. Antalgic gait.  Lymphadenopathy:     Cervical: No cervical adenopathy.  Skin:    General: Skin is warm.     Findings: No erythema or rash.  Neurological:     General: No focal deficit present.     Mental Status: She is alert and oriented to person, place, and time.     Cranial Nerves: No cranial nerve deficit.     Comments: Gait assisted with a cane.  Psychiatric:     Comments: Well groomed, good eye contact.    ASSESSMENT AND PLAN:   Tracy [name] was seen today for follow-up.  Orders Placed This Encounter  Procedures  . Urinalysis with Reflex Microscopic  . Ambulatory referral to Gastroenterology  . POC HgB A1c   Lab Results  Component Value Date   HGBA1C 5.9 12/25/2020    Prediabetes Recommend a heathy life style for primary prevention.  Abdominal pain, unspecified abdominal location Problem is gradually improving. Continue bland diet,small portions,and adequate hydration.fiber intake. Instructed about warning signs. Follow with GI.  Migraine with aura and without  status migrainosus, not intractable Stable. Episodes are not frequent or debilitating, so I do not think she needs prophylactic treatment. Opioid treatment is not recommended for acute episodes.  Allergic rhinitis due to pollen, unspecified seasonality Problem is well controlled with Zyrtec 10 mg daily and flonase nasal spray daily as needed. Continue nasal saline irrigations.  Generalized osteoarthritis of multiple sites -     Discontinue: HYDROcodone-acetaminophen (NORCO/VICODIN) 5-325 MG tablet; Take 1 tablet by mouth daily as needed for moderate pain. -     HYDROcodone-acetaminophen (NORCO/VICODIN) 5-325 MG tablet; Take 1 tablet by mouth daily as needed for moderate pain.  Colon cancer screening -     Ambulatory referral to Gastroenterology  Chronic heart failure with preserved ejection fraction (HCC) Continue Metoprolol and Furosemide. Following with cardiologist.  Gastroesophageal reflux disease without esophagitis Stable. Continue Pantoprazole same dose for now, we can consider going back to 20 mg in a few weeks. GI referral placed. Continue GERD precautions.  -     pantoprazole (PROTONIX) 40 MG tablet; Take 1 tablet (40 mg total) by mouth daily.  Essential hypertension BP adequately controlled. Continue amlodipine 5 mg daily, Metoprolol Succinate 100 mg,losartan 100 mg, and Imdur 30 mg daily. Continue low-salt diet.  Allergic rhinitis This problem seems to be a contributing to some of her headaches. Problem seems to be well controlled with Zyrtec 10 mg daily and Flonase nasal spray.  Nasal saline irrigations as needed.  Coronary artery disease We discussed benefits of statin. She is not interested in trying a statin again due to myalgias/arthralgias. She is a good candidate for Praluent. Continue Zetia 10 mg daily. Following with cardiologist.  Chronic pain disorder We discussed current guidelines in regard to chronic opioid use for chronic pain  management. Medication contract signed today. We discussed some side effects of hydrocodone-acetaminophen. [place] controlled substance report reviewed.  Spent 59 minutes.  During this time history was obtained and documented, examination was performed, prior labs/imaging reviewed, and assessment/plan discussed.  Return in about 4 months (around 04/26/2021).   Jasminemarie Sherrard G. [name], MD  [facility] [place] office.    A few things to remember from today's visit:   Colon cancer screening - Plan: Ambulatory referral to Gastroenterology  Essential hypertension  Hyperglycemia  Allergic rhinitis due to pollen, unspecified seasonality  Migraine with aura and without status migrainosus, not intractable  Generalized osteoarthritis of multiple sites - Plan: HYDROcodone-acetaminophen (NORCO/VICODIN) 5-325 MG tablet, DISCONTINUED: HYDROcodone-acetaminophen (NORCO/VICODIN) 5-325 MG tablet  If you need refills please call your pharmacy. Do not use My Chart to request refills or for acute issues that need immediate attention.   Acetaminophen up to 2 g daily. Hydrocodone sent to your pharmacy. Keep appt with cardio. Next time we can do labs.  Please be sure medication list is accurate. If a new problem present, please set up appointment sooner than planned today.

## 2020-12-25 NOTE — Assessment & Plan Note (Signed)
We discussed current guidelines in regard to chronic opioid use for chronic pain management. Medication contract signed today. We discussed some side effects of hydrocodone-acetaminophen. [place] controlled substance report reviewed.

## 2020-12-25 NOTE — Patient Instructions (Addendum)
A few things to remember from today's visit:   Colon cancer screening - Plan: Ambulatory referral to Gastroenterology  Essential hypertension  Hyperglycemia  Allergic rhinitis due to pollen, unspecified seasonality  Migraine with aura and without status migrainosus, not intractable  Generalized osteoarthritis of multiple sites - Plan: HYDROcodone-acetaminophen (NORCO/VICODIN) 5-325 MG tablet, DISCONTINUED: HYDROcodone-acetaminophen (NORCO/VICODIN) 5-325 MG tablet  If you need refills please call your pharmacy. Do not use My Chart to request refills or for acute issues that need immediate attention.   Acetaminophen up to 2 g daily. Hydrocodone sent to your pharmacy. Keep appt with cardio. Next time we can do labs.  Please be sure medication list is accurate. If a new problem present, please set up appointment sooner than planned today.

## 2020-12-28 ENCOUNTER — Other Ambulatory Visit: Payer: Self-pay | Admitting: Emergency Medicine

## 2021-01-10 NOTE — Progress Notes (Signed)
Cardiology Office Note:    Date:  01/11/2021   ID:  EVADEAN SPROULE, DOB 02-Jan-1946, MRN [id number]  PCP:  [name], [name], [facility]  Cardiologist:  Sherren Mocha, MD  Advanced Practice Provider:  Liliane Shi, PA-C Electrophysiologist:  None       Referring MD: [name], [name], MD   Chief Complaint:  Follow-up (CAD, CHF)    Patient Profile:    Tracy [name] is a 75 y.o. female with:   Coronary artery disease  ? S/p BMS to LCx in 2007 ? S/p DES to LCx in 10/2017 ? Myoview 07/2019: ant ischemia; high risk ? Cath 07/2019: patent LCx stent, mod diff dz in LAD and mild dz in RCA - med Rx  Heart failure with preserved ejection fraction  ? Echocardiogram 12/18: EF 50-55, Gr 1 DD  Chronic kidney disease 3  COPD  Hypertension   Hyperlipidemia  Elevated LFTs   Aortic atherosclerosis (CT 11/2020)  S/p BSO, mini-lap for drainage of contained cyst (10/2020)  Prior CV studies: Cardiac catheterization 10/19/20 LAD ost 30, prox 50, mid 60, 70; D1 ost 40; 1st septal perf 100 (R-L collats) LCx ost 25, prox stent patent with 10 ISR RCA prox 25 Mean PAP 24 mmHg, PCWP 15 mmHg  EF 50-55    Cardiac catheterization 07/24/2019 LM ok LAD mod tortuous; ost 30, prox 50, mid 60, dist 40 LCx ost 25, prox stent patent w 10 ISR RCA prox 25  Myoview 07/18/2019 EF 32, lat infarct, +anterior ischemia; high risk  Echocardiogram 07/17/2019 EF 55-60, Gr 1 DD, normal RVSF, mild MR, trivial TR     History of Present Illness:    Tracy [name] was last seen in 09/2020.  Since that time, she underwent gyn surgery (BSO, drainage of retained cyst) in 10/2020.  She returns for f/u.  She is here alone.  She continues to have episodes of left-sided chest discomfort.  However, overall, this is improved.  Her breathing is also improved.  She lost a lot of weight when she had no appetite after her surgery.  She has not had orthopnea, syncope.  She has  mild leg edema without significant change.         Past Medical History:  Diagnosis Date  . Allergic rhinitis   . Anxiety   . Arthritis   . Bronchitis   . CAD (coronary artery disease)    1/19 PCI/DES to [place] for ISR, normal EF.   . Cancer ([place])    hx of precancerous cells in right breast   . Cervical dysplasia    unsure of procedure, possible "burning" in her late 89s  . CHF (congestive heart failure) ([place])    Echo 06/2019: EF 55-60, elevated LVEDP, normal RV SF, mild MAC, mild MR, trivial TR  . Complication of anesthesia   . COPD (chronic obstructive pulmonary disease) ([place])    early   . Dyspnea    with exertion   . Family history of adverse reaction to anesthesia    daughter- problems wiht n/v  . GERD (gastroesophageal reflux disease)   . Gout   . Headache(784.0)   . Heart murmur    hx of years ago   . Hyperlipidemia   . Hypertension   . Low back pain   . Menopausal syndrome   . Myocardial infarct ([place]) 2007   hx of  . Overactive bladder   . PONV (postoperative nausea and vomiting)  Current Medications: Current Meds  Medication Sig  . acetaminophen (TYLENOL) 500 MG tablet Take 500-1,000 mg by mouth every 6 (six) hours as needed for headache.   . albuterol (PROVENTIL) (2.5 MG/3ML) 0.083% nebulizer solution Take 3 mLs (2.5 mg total) by nebulization every 6 (six) hours as needed for wheezing or shortness of breath.  Marland Kitchen albuterol (VENTOLIN HFA) 108 (90 Base) MCG/ACT inhaler Inhale 2 puffs into the lungs every 4 (four) hours as needed for wheezing.  Marland Kitchen allopurinol (ZYLOPRIM) 100 MG tablet Take 1 tablet (100 mg total) by mouth daily.  Marland Kitchen amLODipine (NORVASC) 5 MG tablet Take 1 tablet (5 mg total) by mouth daily.  Jearl Klinefelter ELLIPTA 62.5-25 MCG/INH AEPB INHALE 1 PUFF BY MOUTH EVERY DAY  . APPLE CIDER VINEGAR PO Take 2,400 mg by mouth daily.  . ASPERCREME LIDOCAINE EX Apply 1 application topically daily as needed (pain).  Marland Kitchen aspirin EC 81 MG tablet Take 81 mg by mouth daily.   . calcium carbonate (CALCIUM 600) 600 MG TABS tablet Take 600 mg by mouth See admin instructions. Take 600 mg every 2 months  . Camphor-Eucalyptus-Menthol (VICKS VAPORUB EX) Apply 1 application topically daily as needed (congestion).  . cetirizine (ZYRTEC) 10 MG tablet Take 10 mg by mouth daily.  . Cholecalciferol (VITAMIN D3) 50 MCG (2000 UT) TABS Take 4,000 Units by mouth daily.   . clopidogrel (PLAVIX) 75 MG tablet TAKE 1 TABLET (75 MG TOTAL) BY MOUTH DAILY WITH BREAKFAST.  Marland Kitchen Cyanocobalamin (VITAMIN B-12) 5000 MCG SUBL Place 5,000 mcg under the tongue daily.   . fluticasone (FLONASE) 50 MCG/ACT nasal spray SPRAY 2 SPRAYS INTO EACH NOSTRIL EVERY DAY (Patient taking differently: Place 2 sprays into both nostrils daily.)  . furosemide (LASIX) 40 MG tablet Take 40 mg by mouth daily as needed for fluid or edema.  Marland Kitchen guaiFENesin (MUCINEX) 600 MG 12 hr tablet Take 600 mg by mouth 2 (two) times daily as needed (congestion).  Marland Kitchen HYDROcodone-acetaminophen (NORCO/VICODIN) 5-325 MG tablet Take 1 tablet by mouth daily as needed for moderate pain.  . isosorbide mononitrate (IMDUR) 30 MG 24 hr tablet Take 1 tablet (30 mg total) by mouth daily.  Marland Kitchen losartan (COZAAR) 100 MG tablet TAKE 1 TABLET BY MOUTH EVERY DAY (Patient taking differently: Take 100 mg by mouth daily.)  . Menthol-Methyl Salicylate (SALONPAS PAIN RELIEF PATCH EX) Apply 1 patch topically daily as needed (pain).  . Misc Natural Products (TART CHERRY ADVANCED) CAPS Take 1,000 capsules by mouth daily.   . ondansetron (ZOFRAN ODT) 4 MG disintegrating tablet Take 1 tablet (4 mg total) by mouth every 8 (eight) hours as needed for nausea or vomiting.  Marland Kitchen oxymetazoline (AFRIN) 0.05 % nasal spray Place 1 spray into both nostrils 2 (two) times daily as needed for congestion.  . pantoprazole (PROTONIX) 40 MG tablet Take 1 tablet (40 mg total) by mouth daily.  . potassium chloride SA (KLOR-CON) 20 MEQ tablet Take 20 mEq by mouth daily.  . Simethicone (GAS-X  PO) Take 2 tablets by mouth daily as needed (gas).  . [DISCONTINUED] metoprolol succinate (TOPROL-XL) 100 MG 24 hr tablet TAKE 1 TABLET (100 MG TOTAL) BY MOUTH DAILY. TAKE WITH OR IMMEDIATELY FOLLOWING A MEAL.  . [DISCONTINUED] sucralfate (CARAFATE) 1 GM/10ML suspension Take 10 mLs (1 g total) by mouth 4 (four) times daily -  with meals and at bedtime.     Allergies:   Erythromycin and Sulfamethoxazole   Social History   Tobacco Use  . Smoking status: Former Smoker  Packs/day: 1.25    Years: 52.00    Pack years: 65.00    Types: Cigarettes    Quit date: 2018    Years since quitting: 4.2  . Smokeless tobacco: Never Used  . Tobacco comment: completely quit May of 2018; period of years she did not smoke   Vaping Use  . Vaping Use: Never used  Substance Use Topics  . Alcohol use: Yes    Comment: seldom  . Drug use: No     Family Hx: The patient's family history includes Cancer in her mother; Colon cancer (age of onset: 71) in her mother; Depression in her mother; Diabetes in her daughter; Other in an other family member. There is no history of Stomach cancer. She was adopted.  ROS   EKGs/Labs/Other Test Reviewed:    EKG:  EKG is not ordered today.  The ekg ordered today demonstrates n/a  Recent Labs: 06/16/2020: NT-Pro BNP 349 11/27/2020: ALT 30; BUN 12; Creatinine, Ser 1.25; Hemoglobin 13.3; Platelets 319; Potassium 4.2; Sodium 138   Recent Lipid Panel Lab Results  Component Value Date/Time   CHOL 128 03/04/2020 08:11 AM   TRIG 180 (H) 03/04/2020 08:11 AM   HDL 36 (L) 03/04/2020 08:11 AM   CHOLHDL 3.6 03/04/2020 08:11 AM   CHOLHDL 5.0 (H) 09/13/2016 07:38 AM   LDLCALC 62 03/04/2020 08:11 AM   LDLDIRECT 111.0 01/05/2015 10:19 AM      Risk Assessment/Calculations:      Physical Exam:    VS:  BP 110/62   Pulse 74   Ht _0  (1.651 m)   Wt 204 lb (92.5 kg)   SpO2 94%   BMI 33.95 kg/m     Wt Readings from Last 3 Encounters:  01/11/21 204 lb (92.5 kg)   12/25/20 204 lb (92.5 kg)  12/15/20 214 lb (97.1 kg)     Constitutional:      Appearance: Healthy appearance. Not in distress.  Pulmonary:     Effort: Pulmonary effort is normal.     Breath sounds: No wheezing. No rales.  Cardiovascular:     Normal rate. Regular rhythm. Normal S1. Normal S2.     Murmurs: There is no murmur.  Edema:    Pretibial: bilateral trace edema of the pretibial area. Abdominal:     Palpations: Abdomen is soft.  Musculoskeletal:     Cervical back: Neck supple. Skin:    General: Skin is warm and dry.  Neurological:     Mental Status: Alert and oriented to person, place and time.     Cranial Nerves: Cranial nerves are intact.         ASSESSMENT & PLAN:    1. Coronary artery disease involving native coronary artery of native heart with angina pectoris ([place]) History of BMS to the LCx in 2007, DES to the LCx in 1/19.  She recently went cardiac catheterization in 12/21 for anginal symptoms.  This demonstrated patent stent in the LCx.  She had moderate nonobstructive disease in the LAD.  There was a first septal perforator that was occluded with right to right left collaterals and this is managed medically.  Overall, her anginal symptoms are stable.  We discussed the possibility of increasing her isosorbide further.  She would like to hold off on this for now.  Continue current medications which include isosorbide, metoprolol succinate, ezetimibe, amlodipine, aspirin, clopidogrel.  Follow-up in 6 months.  2. Chronic heart failure with preserved ejection fraction (HCC) Overall, volume status appears stable.  Her  breathing is actually improved.  Continue current management.  3. Essential (primary) hypertension The patient's blood pressure is controlled on her current regimen.  Continue current therapy.   4. Mixed hyperlipidemia She is off of atorvastatin due to elevated LFTs.  She had an ultrasound performed in December which demonstrated hepatic steatosis.  She  does feel better off statin therapy.  She previously had significant arthralgias.  She remains on ezetimibe alone.  We discussed alternative therapies including PCSK9 inhibitors, siRNA and bempedoic acid.  I will start with a f/u fasting Lipids and CMET.  If her LDL is > 70, I will refer her to the Lipid Clinic.   Dispo:  Return in about 6 months (around 07/14/2021) for Routine Follow Up, w/ Dr. Burt Knack, in person.   Medication Adjustments/Labs and Tests Ordered: Current medicines are reviewed at length with the patient today.  Concerns regarding medicines are outlined above.  Tests Ordered: Orders Placed This Encounter  Procedures  . Comp Met (CMET)  . Lipid panel   Medication Changes: Meds ordered this encounter  Medications  . metoprolol succinate (TOPROL-XL) 100 MG 24 hr tablet    Sig: Take 1 tablet (100 mg total) by mouth daily. Take with or immediately following a meal.    Dispense:  90 tablet    Refill:  0    Signed, Richardson Dopp, PA-C  01/11/2021 12:57 PM    [facility] [address], [place], [place]  [address] Phone: [phone]; Fax: [phone]

## 2021-01-11 ENCOUNTER — Encounter: Payer: Self-pay | Admitting: Physician Assistant

## 2021-01-11 ENCOUNTER — Other Ambulatory Visit: Payer: Self-pay

## 2021-01-11 ENCOUNTER — Ambulatory Visit (INDEPENDENT_AMBULATORY_CARE_PROVIDER_SITE_OTHER): Payer: Medicare Other | Admitting: Physician Assistant

## 2021-01-11 VITALS — BP 110/62 | HR 74 | Ht 65.0 in | Wt 204.0 lb

## 2021-01-11 DIAGNOSIS — E782 Mixed hyperlipidemia: Secondary | ICD-10-CM | POA: Diagnosis not present

## 2021-01-11 DIAGNOSIS — N1831 Chronic kidney disease, stage 3a: Secondary | ICD-10-CM

## 2021-01-11 DIAGNOSIS — I25119 Atherosclerotic heart disease of native coronary artery with unspecified angina pectoris: Secondary | ICD-10-CM

## 2021-01-11 DIAGNOSIS — I5032 Chronic diastolic (congestive) heart failure: Secondary | ICD-10-CM | POA: Diagnosis not present

## 2021-01-11 DIAGNOSIS — I10 Essential (primary) hypertension: Secondary | ICD-10-CM

## 2021-01-11 MED ORDER — METOPROLOL SUCCINATE ER 100 MG PO TB24: 100.0000 mg | ORAL_TABLET | Freq: Every day | ORAL | 0 refills | Status: DC

## 2021-01-11 NOTE — Patient Instructions (Addendum)
Medication Instructions:  Your physician recommends that you continue on your current medications as directed. Please refer to the Current Medication list given to you today.  *If you need a refill on your cardiac medications before your next appointment, please call your pharmacy*   Lab Work: 3-4 WEEKS COME BACK FOR FASTING:  LIPID & CMET If you have labs (blood work) drawn today and your tests are completely normal, you will receive your results only by: Marland Kitchen MyChart Message (if you have MyChart) OR . A paper copy in the mail If you have any lab test that is abnormal or we need to change your treatment, we will call you to review the results.   Testing/Procedures: None ordered   Follow-Up: At [facility], you and your health needs are our priority.  As part of our continuing mission to provide you with exceptional heart care, we have created designated Provider Care Teams.  These Care Teams include your primary Cardiologist (physician) and Advanced Practice Providers (APPs -  Physician Assistants and Nurse Practitioners) who all work together to provide you with the care you need, when you need it.  We recommend signing up for the patient portal called "MyChart".  Sign up information is provided on this After Visit Summary.  MyChart is used to connect with patients for Virtual Visits (Telemedicine).  Patients are able to view lab/test results, encounter notes, upcoming appointments, etc.  Non-urgent messages can be sent to your provider as well.   To learn more about what you can do with MyChart, go to [email or web address].    Your next appointment:   6 month(s)  The format for your next appointment:   In Person  Provider:   You may see Sherren Mocha, MD or one of the following Advanced Practice Providers on your designated Care Team:    Richardson Dopp, PA-C  Robbie Lis, [place]    Other Instructions

## 2021-01-22 ENCOUNTER — Telehealth: Payer: Self-pay | Admitting: *Deleted

## 2021-01-22 NOTE — Telephone Encounter (Signed)
Patient called and canceled her appt for 4/12. Patient is sick, patient aware that the appt was a phone visit. Patient will call back to reschedule

## 2021-01-26 ENCOUNTER — Inpatient Hospital Stay: Payer: Medicare Other | Admitting: Gynecologic Oncology

## 2021-01-26 ENCOUNTER — Other Ambulatory Visit: Payer: Self-pay | Admitting: Family Medicine

## 2021-01-26 DIAGNOSIS — K5900 Constipation, unspecified: Secondary | ICD-10-CM

## 2021-02-03 ENCOUNTER — Other Ambulatory Visit: Payer: Self-pay

## 2021-02-03 ENCOUNTER — Other Ambulatory Visit: Payer: Medicare Other | Admitting: *Deleted

## 2021-02-03 DIAGNOSIS — I5032 Chronic diastolic (congestive) heart failure: Secondary | ICD-10-CM

## 2021-02-03 DIAGNOSIS — E782 Mixed hyperlipidemia: Secondary | ICD-10-CM

## 2021-02-03 DIAGNOSIS — I10 Essential (primary) hypertension: Secondary | ICD-10-CM | POA: Diagnosis not present

## 2021-02-03 DIAGNOSIS — I25119 Atherosclerotic heart disease of native coronary artery with unspecified angina pectoris: Secondary | ICD-10-CM | POA: Diagnosis not present

## 2021-02-03 LAB — LIPID PANEL
Chol/HDL Ratio: 5.2 ratio — ABNORMAL HIGH (ref 0.0–4.4)
Cholesterol, Total: 214 mg/dL — ABNORMAL HIGH (ref 100–199)
HDL: 41 mg/dL (ref >39)
LDL Chol Calc (NIH): 140 mg/dL — ABNORMAL HIGH (ref 0–99)
Triglycerides: 183 mg/dL — ABNORMAL HIGH (ref 0–149)
VLDL Cholesterol Cal: 33 mg/dL (ref 5–40)

## 2021-02-03 LAB — COMPREHENSIVE METABOLIC PANEL
ALT: 21 IU/L (ref 0–32)
AST: 24 IU/L (ref 0–40)
Albumin/Globulin Ratio: 0.9 — ABNORMAL LOW (ref 1.2–2.2)
Albumin: 3.8 g/dL (ref 3.7–4.7)
Alkaline Phosphatase: 88 IU/L (ref 44–121)
BUN/Creatinine Ratio: 22 (ref 12–28)
BUN: 25 mg/dL (ref 8–27)
Bilirubin Total: 0.3 mg/dL (ref 0.0–1.2)
CO2: 22 mmol/L (ref 20–29)
Calcium: 9.4 mg/dL (ref 8.7–10.3)
Chloride: 103 mmol/L (ref 96–106)
Creatinine, Ser: 1.14 mg/dL — ABNORMAL HIGH (ref 0.57–1.00)
Globulin, Total: 4.1 g/dL (ref 1.5–4.5)
Glucose: 103 mg/dL — ABNORMAL HIGH (ref 65–99)
Potassium: 4.6 mmol/L (ref 3.5–5.2)
Sodium: 139 mmol/L (ref 134–144)
Total Protein: 7.9 g/dL (ref 6.0–8.5)
eGFR: 51 mL/min/{1.73_m2} — ABNORMAL LOW (ref >59)

## 2021-02-04 ENCOUNTER — Other Ambulatory Visit: Payer: Self-pay | Admitting: *Deleted

## 2021-02-04 DIAGNOSIS — E782 Mixed hyperlipidemia: Secondary | ICD-10-CM

## 2021-02-19 ENCOUNTER — Other Ambulatory Visit: Payer: Self-pay | Admitting: Cardiovascular Disease

## 2021-02-22 ENCOUNTER — Ambulatory Visit (INDEPENDENT_AMBULATORY_CARE_PROVIDER_SITE_OTHER): Payer: Medicare Other | Admitting: Family Medicine

## 2021-02-22 ENCOUNTER — Other Ambulatory Visit: Payer: Self-pay | Admitting: Family Medicine

## 2021-02-22 ENCOUNTER — Other Ambulatory Visit: Payer: Self-pay

## 2021-02-22 ENCOUNTER — Encounter: Payer: Self-pay | Admitting: Family Medicine

## 2021-02-22 VITALS — BP 120/82 | HR 109 | Temp 97.3°F | Wt 200.4 lb

## 2021-02-22 DIAGNOSIS — K649 Unspecified hemorrhoids: Secondary | ICD-10-CM

## 2021-02-22 DIAGNOSIS — R1084 Generalized abdominal pain: Secondary | ICD-10-CM | POA: Diagnosis not present

## 2021-02-22 DIAGNOSIS — K5909 Other constipation: Secondary | ICD-10-CM

## 2021-02-22 DIAGNOSIS — H7292 Unspecified perforation of tympanic membrane, left ear: Secondary | ICD-10-CM | POA: Diagnosis not present

## 2021-02-22 DIAGNOSIS — I25119 Atherosclerotic heart disease of native coronary artery with unspecified angina pectoris: Secondary | ICD-10-CM | POA: Diagnosis not present

## 2021-02-22 DIAGNOSIS — I10 Essential (primary) hypertension: Secondary | ICD-10-CM | POA: Diagnosis not present

## 2021-02-22 MED ORDER — SENNOSIDES-DOCUSATE SODIUM 8.6-50 MG PO TABS: 1.0000 | ORAL_TABLET | Freq: Every evening | ORAL | 0 refills | Status: DC | PRN

## 2021-02-22 MED ORDER — LACTULOSE 10 GM/15ML PO SOLN: 10.0000 g | Freq: Every day | ORAL | 0 refills | Status: AC

## 2021-02-22 MED ORDER — HYDROCORTISONE ACETATE 25 MG RE SUPP: 25.0000 mg | Freq: Two times a day (BID) | RECTAL | 0 refills | Status: DC

## 2021-02-22 NOTE — Patient Instructions (Addendum)
Chronic Constipation Chronic constipation is a condition in which a person has three or fewer bowel movements a week, for 3 months or longer. This condition is especially common in older adults. What are the causes? Causes of chronic constipation may include:  Not drinking enough fluid, eating enough food or fiber, or getting enough physical activity.  Pregnancy.  A tear in the anus (anal fissure).  Blockage in the bowel (bowel obstruction).  Narrowing of the bowel (bowel stricture).  Having a long-term medical condition, such as: ? Diabetes, hypothyroidism, or iron-deficiency anemia. ? Stroke or spinal cord injury. ? Multiple sclerosis or Parkinson's disease. ? Colon cancer. ? Dementia. ? Inflammatory bowel disease (IBD), outward collapse of the rectum (rectal prolapse), or hemorrhoids.  Taking certain medicines, including: ? Narcotics. These are a certain type of prescription pain medicine. ? Antacids or iron supplements. ? Water pills (diuretics). ? Certain blood pressure medicines. ? Anti-seizure medicines. ? Antidepressants. ? Medicines for Parkinson's disease. Other causes of this condition may include:  Stress.  Problems in the nerves and muscles that control the movement of stool.  Weak or impaired pelvic floor muscles.   What increases the risk? You may be at higher risk for chronic constipation if:  You are older than age 39.  You are female.  You live in a long-term care facility.  You have a long-term disease.  You have a mental health disorder or eating disorder. What are the signs or symptoms? The main symptom of chronic constipation is having three or fewer bowel movements a week for several weeks. Other signs and symptoms may vary from person to person. These include:  Pushing hard (straining) to pass stool, or having hard or lumpy stools.  Painful bowel movements.  Having lower abdominal discomfort, such as cramps or bloating.  Being  unable to have a bowel movement when you feel the urge, or feeling like you still need to pass stool after a bowel movement.  Feeling that you have something in your rectum that is blocking or preventing bowel movements.  Seeing blood on the toilet paper or in your stool.  Worsening confusion (in older adults). How is this diagnosed? This condition may be diagnosed based on:  Your symptoms and medical history. You will be asked about your symptoms, lifestyle, diet, and any medicines that you are taking.  A physical exam. ? Your abdomen will be examined. ? A digital rectal exam may be done. For this exam, a health care provider places a lubricated, gloved finger into the rectum.  Tests to check for any underlying causes of your constipation. These may be ordered if you have bleeding in your rectum, weight loss, or a family history of colon cancer. In these cases, you may have: ? Imaging studies of the colon. These may include X-ray, ultrasound, or a CT scan. ? Blood tests. ? A procedure to examine the inside of your colon (colonoscopy). ? More specialized tests to check:  Whether your anal sphincter works well. This is a ring-shaped muscle that controls the closing of the anus.  How well food moves through your colon. ? Tests to measure the nerve signal in your pelvic floor muscles (electromyography). How is this treated? Treatment for chronic constipation depends on the cause. Most often, treatment starts with:  Being more active and getting regular exercise.  Drinking more fluids.  Adding fiber to your diet. Sources of fiber include fruits, vegetables, whole grains, and fiber supplements.  Using medicines such as stool softeners  or medicines that increase contractions in your digestive system (pro-motility agents).  Training your pelvic muscles with biofeedback.  Surgery, if there is obstruction. Treatment may also include:  Stopping or changing some medicines if they cause  constipation.  Using a fiber supplement (bulk laxative) or stool softener.  Using a prescription laxative. This works by [employer] into your colon (osmotic laxative). You may also need to see a specialist who treats conditions of the digestive system (gastroenterologist).   Follow these instructions at home: Medicines  Take over-the-counter and prescription medicines only as told by your health care provider.  If you are taking a laxative, take it as told by your health care provider. Eating and drinking  Eat a balanced diet that includes enough fiber. Ask your health care provider to recommend a diet that is right for you.  Drink clear fluids, especially water. Avoid drinking alcohol, caffeine, and soda. These can make constipation worse.  Drink enough fluid to keep your urine pale yellow.   General instructions  Get some physical activity every day. Ask your health care provider what activities are safe for you.  Get colon cancer screenings as told by your health care provider.  Keep all follow-up visits as told by your health care provider. This is important. Contact a health care provider if you have:  Three or fewer bowel movements a week.  Stools that are hard or lumpy.  Blood on the toilet paper or in your stool after you have a bowel movement.  Unexplained weight loss.  Rectum (rectal) pain.  Stool leakage.  Nausea or vomiting. Get help right away if you have:  Rectal bleeding or you pass blood clots.  Severe rectal pain.  Body tissue that pushes out (protrudes) from your anus.  Severe pain or bloating (distension) in your abdomen.  Vomiting that you cannot control. Summary  Chronic constipation is a condition in which a person has three or fewer bowel movements a week, for 3 months or longer.  You may have a higher risk for this condition if you are an older adult, you are female, or you have a long-term disease.  Treatment for this condition  depends on the cause. Most treatments for chronic constipation include adding fiber to your diet, drinking more fluids, and getting more physical activity. You may also need to treat any underlying medical conditions or stop or change certain medicines if they cause constipation.  If lifestyle changes do not relieve constipation, your health care provider may recommend taking a laxative. This information is not intended to replace advice given to you by your health care provider. Make sure you discuss any questions you have with your health care provider. Document Revised: 08/21/2019 Document Reviewed: 08/21/2019 Elsevier Patient Education  2021 [place].  Hemorrhoids Hemorrhoids are swollen veins in and around the rectum or anus. There are two types of hemorrhoids:  Internal hemorrhoids. These occur in the veins that are just inside the rectum. They may poke through to the outside and become irritated and painful.  External hemorrhoids. These occur in the veins that are outside the anus and can be felt as a painful swelling or hard lump near the anus. Most hemorrhoids do not cause serious problems, and they can be managed with home treatments such as diet and lifestyle changes. If home treatments do not help the symptoms, procedures can be done to shrink or remove the hemorrhoids. What are the causes? This condition is caused by increased pressure in the anal  area. This pressure may result from various things, including:  Constipation.  Straining to have a bowel movement.  Diarrhea.  Pregnancy.  Obesity.  Sitting for long periods of time.  Heavy lifting or other activity that causes you to strain.  Anal sex.  Riding a bike for a long period of time. What are the signs or symptoms? Symptoms of this condition include:  Pain.  Anal itching or irritation.  Rectal bleeding.  Leakage of stool (feces).  Anal swelling.  One or more lumps around the anus. How is this  diagnosed? This condition can often be diagnosed through a visual exam. Other exams or tests may also be done, such as:  An exam that involves feeling the rectal area with a gloved hand (digital rectal exam).  An exam of the anal canal that is done using a small tube (anoscope).  A blood test, if you have lost a significant amount of blood.  A test to look inside the colon using a flexible tube with a camera on the end (sigmoidoscopy or colonoscopy). How is this treated? This condition can usually be treated at home. However, various procedures may be done if dietary changes, lifestyle changes, and other home treatments do not help your symptoms. These procedures can help make the hemorrhoids smaller or remove them completely. Some of these procedures involve surgery, and others do not. Common procedures include:  Rubber band ligation. Rubber bands are placed at the base of the hemorrhoids to cut off their blood supply.  Sclerotherapy. Medicine is injected into the hemorrhoids to shrink them.  Infrared coagulation. A type of light energy is used to get rid of the hemorrhoids.  Hemorrhoidectomy surgery. The hemorrhoids are surgically removed, and the veins that supply them are tied off.  Stapled hemorrhoidopexy surgery. The surgeon staples the base of the hemorrhoid to the rectal wall. Follow these instructions at home: Eating and drinking  Eat foods that have a lot of fiber in them, such as whole grains, beans, nuts, fruits, and vegetables.  Ask your health care provider about taking products that have added fiber (fiber supplements).  Reduce the amount of fat in your diet. You can do this by eating low-fat dairy products, eating less red meat, and avoiding processed foods.  Drink enough fluid to keep your urine pale yellow.   Managing pain and swelling  Take warm sitz baths for 20 minutes, 3-4 times a day to ease pain and discomfort. You may do this in a bathtub or using a portable  sitz bath that fits over the toilet.  If directed, apply ice to the affected area. Using ice packs between sitz baths may be helpful. ? Put ice in a plastic bag. ? Place a towel between your skin and the bag. ? Leave the ice on for 20 minutes, 2-3 times a day.   General instructions  Take over-the-counter and prescription medicines only as told by your health care provider.  Use medicated creams or suppositories as told.  Get regular exercise. Ask your health care provider how much and what kind of exercise is best for you. In general, you should do moderate exercise for at least 30 minutes on most days of the week (150 minutes each week). This can include activities such as walking, biking, or yoga.  Go to the bathroom when you have the urge to have a bowel movement. Do not wait.  Avoid straining to have bowel movements.  Keep the anal area dry and clean. Use wet  toilet paper or moist towelettes after a bowel movement.  Do not sit on the toilet for long periods of time. This increases blood pooling and pain.  Keep all follow-up visits as told by your health care provider. This is important. Contact a health care provider if you have:  Increasing pain and swelling that are not controlled by treatment or medicine.  Difficulty having a bowel movement, or you are unable to have a bowel movement.  Pain or inflammation outside the area of the hemorrhoids. Get help right away if you have:  Uncontrolled bleeding from your rectum. Summary  Hemorrhoids are swollen veins in and around the rectum or anus.  Most hemorrhoids can be managed with home treatments such as diet and lifestyle changes.  Taking warm sitz baths can help ease pain and discomfort.  In severe cases, procedures or surgery can be done to shrink or remove the hemorrhoids. This information is not intended to replace advice given to you by your health care provider. Make sure you discuss any questions you have with your  health care provider. Document Revised: 03/01/2019 Document Reviewed: 02/22/2018 Elsevier Patient Education  [place].

## 2021-02-22 NOTE — Progress Notes (Signed)
Subjective:    Patient ID: Tracy [name], female    DOB: 04-03-1946, 75 y.o.   MRN: [id number]  Chief Complaint  Patient presents with  . Constipation    States she has not been able to go since surgery in Jan. Takes a laxative when needed but not daily. Getting worse, been eating activia, low back and stomach pain, tender at the top of stomach under breast. Has the shakes in her hands that comes and goes.    HPI Patient was seen today for ongoing concern. Pt with constipation since she had surgery in January for oophorectomy.  Pt notes abd bloating, epigastric and suprapubic pain, low back pain, and shaking in hands.  Patient states she has not been able to eat as much 2/2 the uncomfortable/full feeling in her stomach.  Patient had activity a yogurt this morning.  Pt taking Norco 5-325 mg 1/2 pill as needed for OA in multiple joints.  Patient does not take medication every day but notes increase in symptoms with change in weather.  Patient may have a BM when taking an OTC stool softener pill.  Did not want to become dependent on pills.  Patient also notes sensation of ears feeling stopped up.  Past Medical History:  Diagnosis Date  . Allergic rhinitis   . Anxiety   . Arthritis   . Bronchitis   . CAD (coronary artery disease)    1/19 PCI/DES to [place] for ISR, normal EF.   . Cancer ([place])    hx of precancerous cells in right breast   . Cervical dysplasia    unsure of procedure, possible "burning" in her late 71s  . CHF (congestive heart failure) ([place])    Echo 06/2019: EF 55-60, elevated LVEDP, normal RV SF, mild MAC, mild MR, trivial TR  . Complication of anesthesia   . COPD (chronic obstructive pulmonary disease) ([place])    early   . Dyspnea    with exertion   . Family history of adverse reaction to anesthesia    daughter- problems wiht n/v  . GERD (gastroesophageal reflux disease)   . Gout   . Headache(784.0)   . Heart murmur    hx of years ago   . Hyperlipidemia   .  Hypertension   . Low back pain   . Menopausal syndrome   . Myocardial infarct ([place]) 2007   hx of  . Overactive bladder   . PONV (postoperative nausea and vomiting)     Allergies  Allergen Reactions  . Erythromycin Rash    But isn't certain  . Sulfamethoxazole Rash    ROS General: Denies fever, chills, night sweats, changes in weight, changes in appetite  + decreased appetite HEENT: Denies headaches, ear pain, changes in vision, rhinorrhea, sore throat  + ears feeling stopped up CV: Denies CP, palpitations, SOB, orthopnea Pulm: Denies SOB, cough, wheezing GI: Denies abdominal pain, nausea, vomiting, diarrhea,  + constipation, abdominal pain, bloating GU: Denies dysuria, hematuria, frequency, vaginal discharge Msk: Denies muscle cramps, joint pains  + low back pain, joint pain Neuro: Denies weakness, numbness, tingling Skin: Denies rashes, bruising Psych: Denies depression, anxiety, hallucinations      Objective:    Blood pressure 120/82, pulse (!) 109, temperature (!) 97.3 F (36.3 C), temperature source Oral, weight 200 lb 6.4 oz (90.9 kg), SpO2 97 %.  Gen. Pleasant, well-nourished, in no distress, normal affect   HEENT: [place]/AT, face symmetric, conjunctiva clear, no scleral icterus, PERRLA, EOMI, nares patent without drainage, R  external ear, canal, and TM normal.  L external ear normal.  L canal with dried skin at opening.  L TM with perforation.  No drainage or erythema noted. Lungs: no accessory muscle use, CTAB, no wheezes or rales Cardiovascular: RRR, no m/r/g, no peripheral edema Abdomen: BS present, soft, TTP of epigastric and suprapubic areas. No hepatosplenomegaly.   GU: normal external female genitalia.  External hemorrhoids noted.  Sphincter tone.  Rectum smooth.  No stool in rectal vault.  Small amount of brown stool on glove.  Hemoccult positive. Musculoskeletal: No deformities, no cyanosis or clubbing, normal tone Neuro:  A&Ox3, CN II-XII intact, normal  gait Skin:  Warm, no lesions/ rash   Wt Readings from Last 3 Encounters:  01/11/21 204 lb (92.5 kg)  12/25/20 204 lb (92.5 kg)  12/15/20 214 lb (97.1 kg)    Lab Results  Component Value Date   WBC 9.0 11/27/2020   HGB 13.3 11/27/2020   HCT 44.1 11/27/2020   PLT 319 11/27/2020   GLUCOSE 103 (H) 02/03/2021   CHOL 214 (H) 02/03/2021   TRIG 183 (H) 02/03/2021   HDL 41 02/03/2021   LDLDIRECT 111.0 01/05/2015   LDLCALC 140 (H) 02/03/2021   ALT 21 02/03/2021   AST 24 02/03/2021   NA 139 02/03/2021   K 4.6 02/03/2021   CL 103 02/03/2021   CREATININE 1.14 (H) 02/03/2021   BUN 25 02/03/2021   CO2 22 02/03/2021   TSH 2.02 01/14/2014   INR 1.0 11/03/2017   HGBA1C 5.9 12/25/2020   MICROALBUR 1.7 06/27/2018    Assessment/Plan:  Chronic constipation  -likely exacerbated 2/2 Norco use for OA -CMP from 02/03/2021 reviewed -Discussed diet modifications -Would likely benefit from regular bowel regimen -UA canceled as patient unable to provide specimen - Plan: lactulose (CHRONULAC) 10 GM/15ML solution, senna-docusate (SENOKOT-S) 8.6-50 MG tablet, CANCELED: POCT urinalysis dipstick  Perforation of left tympanic membrane -Noted on exam -Stable without drainage.  Essential hypertension -BP initially elevated -Recheck 120/82 -Lifestyle modifications -Continue current medications  Generalized abdominal pain -Likely 2/2 ongoing constipation -Bowel regimen -For continued symptoms obtain UA -Given precautions  Hemorrhoids, unspecified hemorrhoid type -Discussed bowel regimen.  We will start lactulose x2 days then Senokot-S daily as needed - Plan: hydrocortisone (ANUSOL-HC) 25 MG suppository  F/u as needed in the next week  Grier Mitts, MD

## 2021-02-27 DIAGNOSIS — Z23 Encounter for immunization: Secondary | ICD-10-CM | POA: Diagnosis not present

## 2021-03-01 ENCOUNTER — Encounter: Payer: Self-pay | Admitting: Internal Medicine

## 2021-03-01 ENCOUNTER — Ambulatory Visit (INDEPENDENT_AMBULATORY_CARE_PROVIDER_SITE_OTHER): Payer: Medicare Other | Admitting: Internal Medicine

## 2021-03-01 ENCOUNTER — Other Ambulatory Visit: Payer: Self-pay

## 2021-03-01 VITALS — BP 132/74 | HR 107 | Ht 65.0 in | Wt 198.6 lb

## 2021-03-01 DIAGNOSIS — E785 Hyperlipidemia, unspecified: Secondary | ICD-10-CM

## 2021-03-01 DIAGNOSIS — R7989 Other specified abnormal findings of blood chemistry: Secondary | ICD-10-CM | POA: Diagnosis not present

## 2021-03-01 DIAGNOSIS — I25119 Atherosclerotic heart disease of native coronary artery with unspecified angina pectoris: Secondary | ICD-10-CM

## 2021-03-01 MED ORDER — PRAVASTATIN SODIUM 40 MG PO TABS: 40.0000 mg | ORAL_TABLET | Freq: Every evening | ORAL | 3 refills | Status: DC

## 2021-03-01 NOTE — Patient Instructions (Signed)
Medication Instructions:  START pravastatin 40mg  daily  *If you need a refill on your cardiac medications before your next appointment, please call your pharmacy*   Lab Work: Hepatic Function Panel in 2 weeks  Lipid Panel (fasting) in 3 months   If you have labs (blood work) drawn today and your tests are completely normal, you will receive your results only by: Marland Kitchen MyChart Message (if you have MyChart) OR . A paper copy in the mail If you have any lab test that is abnormal or we need to change your treatment, we will call you to review the results.   Testing/Procedures: NONE   Follow-Up: At [facility], you and your health needs are our priority.  As part of our continuing mission to provide you with exceptional heart care, we have created designated Provider Care Teams.  These Care Teams include your primary Cardiologist (physician) and Advanced Practice Providers (APPs -  Physician Assistants and Nurse Practitioners) who all work together to provide you with the care you need, when you need it.  We recommend signing up for the patient portal called "MyChart".  Sign up information is provided on this After Visit Summary.  MyChart is used to connect with patients for Virtual Visits (Telemedicine).  Patients are able to view lab/test results, encounter notes, upcoming appointments, etc.  Non-urgent messages can be sent to your provider as well.   To learn more about what you can do with MyChart, go to [email or web address].    Your next appointment:   3-4 month(s)  The format for your next appointment:   In Person  Provider:   K. Mali Hilty, MD   Other Instructions

## 2021-03-01 NOTE — Progress Notes (Addendum)
LIPID CLINIC CONSULT NOTE  Chief Complaint:  Manage dyslipidemia, elevated liver enzymes  Primary Care Physician: [name], [name], MD  Primary Cardiologist:  Sherren Mocha, MD  HPI:  Tracy [name] is a 75 y.o. female who is being seen today for the evaluation of dyslipidemia, elevated liver enzyme at the request of Liliane Shi, PA-C.  This is a pleasant 75 year old female patient of Dr. Juliann Pulse, PA-C, who presents for evaluation and management of dyslipidemia.  She has a history of coronary artery disease with prior stents.  She had a recent repeat cardiac catheterization in December 2021, but was found to have no significant stenosis requiring any repeat intervention.  LVEF at the time was 50% with mild pulmonary hypertension noted during right heart catheterization.  She was subsequently seen in follow-up and had been found in December by her PCP to have had elevated liver enzymes.  Her atorvastatin was discontinued because of myalgias, particularly and worsening knee pain.  Tracy [name] says that those symptoms had improved significantly off the statin.  Her liver enzymes at worst were AST 88 and ALT 69.  They have since normalized.  Ultrasound demonstrated hepatic steatosis.  Subsequently she has remained on ezetimibe which she has been on for some time.  She said she recently had GU surgery and subsequently has struggled with constipation.  She also has been using a narcotic pain medicine.  PMHx:  Past Medical History:  Diagnosis Date   Allergic rhinitis    Anxiety    Arthritis    Bronchitis    CAD (coronary artery disease)    1/19 PCI/DES to [place] for ISR, normal EF.    Cancer ([place])    hx of precancerous cells in right breast    Cervical dysplasia    unsure of procedure, possible "burning" in her late 75s   CHF (congestive heart failure) ([place])    Echo 06/2019: EF 55-60, elevated LVEDP, normal RV SF, mild MAC, mild MR, trivial TR   Complication of  anesthesia    COPD (chronic obstructive pulmonary disease) (HCC)    early    Dyspnea    with exertion    Family history of adverse reaction to anesthesia    daughter- problems wiht n/v   GERD (gastroesophageal reflux disease)    Gout    Headache(784.0)    Heart murmur    hx of years ago    Hyperlipidemia    Hypertension    Low back pain    Menopausal syndrome    Myocardial infarct ([place]) 2007   hx of   Overactive bladder    PONV (postoperative nausea and vomiting)     Past Surgical History:  Procedure Laterality Date   ANGIOPLASTY     stent 2007   BREAST LUMPECTOMY WITH RADIOACTIVE SEED LOCALIZATION Right 04/28/2020   Procedure: RIGHT BREAST LUMPECTOMY X 2  WITH RADIOACTIVE SEED LOCALIZATION;  Surgeon: Donnie Mesa, MD;  Location: [facility];  Service: General;  Laterality: Right;  LMA   CARDIAC CATHETERIZATION     COLONOSCOPY     CORONARY STENT INTERVENTION N/A 11/06/2017   Procedure: CORONARY STENT INTERVENTION;  Surgeon: [name], MD;  Location: [facility];  Service: Cardiovascular;  Laterality: N/A;   LEFT HEART CATH AND CORONARY ANGIOGRAPHY N/A 11/06/2017   Procedure: LEFT HEART CATH AND CORONARY ANGIOGRAPHY;  Surgeon: [name], MD;  Location: [facility];  Service: Cardiovascular;  Laterality: N/A;   LEFT HEART CATH  AND CORONARY ANGIOGRAPHY N/A 07/24/2019   Procedure: LEFT HEART CATH AND CORONARY ANGIOGRAPHY;  Surgeon: Burnell Blanks, MD;  Location: [facility];  Service: Cardiovascular;  Laterality: N/A;   RIGHT/LEFT HEART CATH AND CORONARY ANGIOGRAPHY N/A 09/29/2020   Procedure: RIGHT/LEFT HEART CATH AND CORONARY ANGIOGRAPHY;  Surgeon: Belva Crome, MD;  Location: [facility];  Service: Cardiovascular;  Laterality: N/A;   ROBOTIC ASSISTED BILATERAL SALPINGO OOPHERECTOMY Bilateral 11/10/2020   Procedure: XI ROBOTIC ASSISTED BILATERAL SALPINGO OOPHORECTOMY WITH MINI LAPAROTOMY FOR DRAINAGE;  Surgeon:  Lafonda Mosses, MD;  Location: [facility];  Service: Gynecology;  Laterality: Bilateral;  MINI LAP FIRST   TONSILLECTOMY     VULVECTOMY N/A 11/10/2020   Procedure: WIDE EXCISION VULVECTOMY;  Surgeon: Lafonda Mosses, MD;  Location: [facility];  Service: Gynecology;  Laterality: N/A;    FAMHx:  Family History  Adopted: Yes  Problem Relation Age of Onset   Colon cancer Mother 73   Cancer Mother        colon   Depression Mother        Suicide.   Other Other        patient is adopted   Diabetes Daughter        type 2   Stomach cancer Neg Hx     SOCHx:   reports that she quit smoking about 4 years ago. Her smoking use included cigarettes. She has a 65.00 pack-year smoking history. She has never used smokeless tobacco. She reports current alcohol use. She reports that she does not use drugs.  ALLERGIES:  Allergies  Allergen Reactions   Erythromycin Rash    But isn't certain   Sulfamethoxazole Rash    ROS: Pertinent items noted in HPI and remainder of comprehensive ROS otherwise negative.  HOME MEDS: Current Outpatient Medications on File Prior to Visit  Medication Sig Dispense Refill   acetaminophen (TYLENOL) 500 MG tablet Take 500-1,000 mg by mouth every 6 (six) hours as needed for headache.      albuterol (PROVENTIL) (2.5 MG/3ML) 0.083% nebulizer solution Take 3 mLs (2.5 mg total) by nebulization every 6 (six) hours as needed for wheezing or shortness of breath. 300 mL 5   albuterol (VENTOLIN HFA) 108 (90 Base) MCG/ACT inhaler Inhale 2 puffs into the lungs every 4 (four) hours as needed for wheezing. 1 each 3   allopurinol (ZYLOPRIM) 100 MG tablet Take 1 tablet (100 mg total) by mouth daily. 90 tablet 2   amLODipine (NORVASC) 5 MG tablet Take 1 tablet (5 mg total) by mouth daily. 90 tablet 3   ANORO ELLIPTA 62.5-25 MCG/INH AEPB INHALE 1 PUFF BY MOUTH EVERY DAY 60 each 12   APPLE CIDER VINEGAR PO Take 2,400 mg by mouth daily.     ASPERCREME LIDOCAINE EX Apply 1 application  topically daily as needed (pain).     aspirin EC 81 MG tablet Take 81 mg by mouth daily.     calcium carbonate (CALCIUM 600) 600 MG TABS tablet Take 600 mg by mouth See admin instructions. Take 600 mg every 2 months     Camphor-Eucalyptus-Menthol (VICKS VAPORUB EX) Apply 1 application topically daily as needed (congestion).     cetirizine (ZYRTEC) 10 MG tablet Take 10 mg by mouth daily.     Cholecalciferol (VITAMIN D3) 50 MCG (2000 UT) TABS Take 4,000 Units by mouth daily.      clopidogrel (PLAVIX) 75 MG tablet TAKE 1 TABLET (75 MG TOTAL) BY MOUTH DAILY WITH BREAKFAST.  90 tablet 3   Cyanocobalamin (VITAMIN B-12) 5000 MCG SUBL Place 5,000 mcg under the tongue daily.      ezetimibe (ZETIA) 10 MG tablet TAKE 1 TABLET BY MOUTH DAILY 90 tablet 3   fluticasone (FLONASE) 50 MCG/ACT nasal spray SPRAY 2 SPRAYS INTO EACH NOSTRIL EVERY DAY (Patient taking differently: Place 2 sprays into both nostrils daily.) 48 mL 1   furosemide (LASIX) 40 MG tablet Take 40 mg by mouth daily as needed for fluid or edema.     guaiFENesin (MUCINEX) 600 MG 12 hr tablet Take 600 mg by mouth 2 (two) times daily as needed (congestion).     HYDROcodone-acetaminophen (NORCO/VICODIN) 5-325 MG tablet Take 1 tablet by mouth daily as needed for moderate pain. 30 tablet 0   hydrocortisone (PROCTOSOL HC) 2.5 % rectal cream Place 1 application rectally 2 (two) times daily. 30 g 0   isosorbide mononitrate (IMDUR) 30 MG 24 hr tablet Take 1 tablet (30 mg total) by mouth daily. 30 tablet 6   losartan (COZAAR) 100 MG tablet TAKE 1 TABLET BY MOUTH EVERY DAY (Patient taking differently: Take 100 mg by mouth daily.) 90 tablet 3   Menthol-Methyl Salicylate (SALONPAS PAIN RELIEF PATCH EX) Apply 1 patch topically daily as needed (pain).     metoprolol succinate (TOPROL-XL) 100 MG 24 hr tablet Take 1 tablet (100 mg total) by mouth daily. Take with or immediately following a meal. 90 tablet 0   Misc Natural Products (TART CHERRY ADVANCED) CAPS Take  1,000 capsules by mouth daily.      oxymetazoline (AFRIN) 0.05 % nasal spray Place 1 spray into both nostrils 2 (two) times daily as needed for congestion.     pantoprazole (PROTONIX) 40 MG tablet Take 1 tablet (40 mg total) by mouth daily. 90 tablet 0   potassium chloride SA (KLOR-CON) 20 MEQ tablet Take 20 mEq by mouth daily.     Simethicone (GAS-X PO) Take 2 tablets by mouth daily as needed (gas).     [DISCONTINUED] nitroGLYCERIN (NITROSTAT) 0.4 MG SL tablet Place 1 tablet (0.4 mg total) under the tongue every 5 (five) minutes as needed for chest pain. 25 tablet 3   No current facility-administered medications on file prior to visit.    LABS/IMAGING: No results found for this or any previous visit (from the past 48 hour(s)). No results found.  LIPID PANEL:    Component Value Date/Time   CHOL 214 (H) 02/03/2021 0829   TRIG 183 (H) 02/03/2021 0829   HDL 41 02/03/2021 0829   CHOLHDL 5.2 (H) 02/03/2021 0829   CHOLHDL 5.0 (H) 09/13/2016 0738   VLDL 35 (H) 09/13/2016 0738   LDLCALC 140 (H) 02/03/2021 0829   LDLDIRECT 111.0 01/05/2015 1019    WEIGHTS: Wt Readings from Last 3 Encounters:  03/01/21 198 lb 9.6 oz (90.1 kg)  02/22/21 200 lb 6.4 oz (90.9 kg)  01/11/21 204 lb (92.5 kg)    VITALS: BP 132/74   Pulse (!) 107   Ht _0  (1.651 m)   Wt 198 lb 9.6 oz (90.1 kg)   SpO2 92%   BMI 33.05 kg/m   EXAM: Deferred  EKG: Deferred  ASSESSMENT: CAD with prior PCI Mixed dyslipidemia, goal LDL less than 70 Atorvastatin intolerance-myalgias, elevated liver enzymes Hepatic steatosis  PLAN: 1.   Tracy [name] has a mixed dyslipidemia with an LDL target of less than 70.  She had reached at target on 40 of atorvastatin but had myalgias associated with it that seem to  have improved off the medicine.  She also had some mildly elevated liver enzymes however they have also resolved.  Cholesterol now is much higher with total 214, triglycerides 183, HDL 41 and LDL 140.  I would like to  try an alternative statin and recommend pravastatin 40 mg daily.  She should continue her 10 mg ezetimibe daily.  We will plan repeat lipids in about 3 to 4 months and she may need additional therapy.  We will also recheck her liver enzymes in about 2 weeks and make sure that there is no worsening elevations.  From a liver standpoint, her elevations in the past were less than 3 times upper limit of normal and therefore it would not of been necessary to discontinue her statin based on that however it seems as if discontinuing it actually did improve some myalgias symptoms.   Follow-up with me in 3 months.  Thanks again for the kind referral  [name], MD, FACC, [place] Director of the [facility] Diplomate of the American Board of Clinical Lipidology Attending Cardiologist  Direct Dial: [phone]  Fax: [phone]  Website:  [email or web address]  Tracy [name] 03/01/2021, 1:22 PM

## 2021-03-04 ENCOUNTER — Telehealth: Payer: Self-pay | Admitting: Cardiovascular Disease

## 2021-03-04 MED ORDER — ISOSORBIDE MONONITRATE ER 30 MG PO TB24: 30.0000 mg | ORAL_TABLET | Freq: Every day | ORAL | 3 refills | Status: DC

## 2021-03-04 NOTE — Telephone Encounter (Signed)
*  STAT* If patient is at the pharmacy, call can be transferred to refill team.   1. Which medications need to be refilled? (please list name of each medication and dose if known) Isosorbide  2. Which pharmacy/location (including street and city if local pharmacy) is medication to be sent to? [facility] [address]  3. Do they need a 30 day or 90 day supply? [place]

## 2021-03-04 NOTE — Telephone Encounter (Signed)
Pt's medication was sent to pt's pharmacy as requested. Confirmation received.  °

## 2021-03-16 DIAGNOSIS — E782 Mixed hyperlipidemia: Secondary | ICD-10-CM | POA: Diagnosis not present

## 2021-03-16 LAB — LIPID PANEL
Chol/HDL Ratio: 4.1 ratio (ref 0.0–4.4)
Cholesterol, Total: 172 mg/dL (ref 100–199)
HDL: 42 mg/dL (ref >39)
LDL Chol Calc (NIH): 97 mg/dL (ref 0–99)
Triglycerides: 192 mg/dL — ABNORMAL HIGH (ref 0–149)
VLDL Cholesterol Cal: 33 mg/dL (ref 5–40)

## 2021-03-16 LAB — HEPATIC FUNCTION PANEL
ALT: 22 IU/L (ref 0–32)
AST: 24 IU/L (ref 0–40)
Albumin: 3.9 g/dL (ref 3.7–4.7)
Alkaline Phosphatase: 89 IU/L (ref 44–121)
Bilirubin Total: 0.4 mg/dL (ref 0.0–1.2)
Bilirubin, Direct: 0.12 mg/dL (ref 0.00–0.40)
Total Protein: 7.6 g/dL (ref 6.0–8.5)

## 2021-03-17 ENCOUNTER — Other Ambulatory Visit: Payer: Self-pay | Admitting: *Deleted

## 2021-03-17 DIAGNOSIS — E785 Hyperlipidemia, unspecified: Secondary | ICD-10-CM

## 2021-03-19 ENCOUNTER — Other Ambulatory Visit: Payer: Self-pay | Admitting: Family Medicine

## 2021-03-19 DIAGNOSIS — K219 Gastro-esophageal reflux disease without esophagitis: Secondary | ICD-10-CM

## 2021-04-05 ENCOUNTER — Other Ambulatory Visit: Payer: Self-pay | Admitting: Physician Assistant

## 2021-04-05 ENCOUNTER — Other Ambulatory Visit: Payer: Self-pay | Admitting: Family Medicine

## 2021-04-05 DIAGNOSIS — M109 Gout, unspecified: Secondary | ICD-10-CM

## 2021-04-18 ENCOUNTER — Emergency Department (HOSPITAL_COMMUNITY): Payer: Medicare Other

## 2021-04-18 ENCOUNTER — Emergency Department (HOSPITAL_COMMUNITY)
Admission: EM | Admit: 2021-04-18 | Discharge: 2021-04-18 | Disposition: A | Discharge status: Home / Self Care | Payer: Medicare Other | Attending: Emergency Medicine | Admitting: Emergency Medicine

## 2021-04-18 DIAGNOSIS — S0992XA Unspecified injury of nose, initial encounter: Secondary | ICD-10-CM | POA: Diagnosis present

## 2021-04-18 DIAGNOSIS — S61217A Laceration without foreign body of left little finger without damage to nail, initial encounter: Secondary | ICD-10-CM | POA: Diagnosis not present

## 2021-04-18 DIAGNOSIS — S0993XA Unspecified injury of face, initial encounter: Secondary | ICD-10-CM | POA: Diagnosis not present

## 2021-04-18 DIAGNOSIS — R000 Tachycardia, unspecified: Secondary | ICD-10-CM | POA: Diagnosis not present

## 2021-04-18 DIAGNOSIS — W01198A Fall on same level from slipping, tripping and stumbling with subsequent striking against other object, initial encounter: Secondary | ICD-10-CM | POA: Insufficient documentation

## 2021-04-18 DIAGNOSIS — Z23 Encounter for immunization: Secondary | ICD-10-CM | POA: Diagnosis not present

## 2021-04-18 DIAGNOSIS — Z043 Encounter for examination and observation following other accident: Secondary | ICD-10-CM | POA: Diagnosis not present

## 2021-04-18 DIAGNOSIS — R42 Dizziness and giddiness: Secondary | ICD-10-CM | POA: Diagnosis not present

## 2021-04-18 DIAGNOSIS — S0083XA Contusion of other part of head, initial encounter: Secondary | ICD-10-CM

## 2021-04-18 DIAGNOSIS — S6992XA Unspecified injury of left wrist, hand and finger(s), initial encounter: Secondary | ICD-10-CM

## 2021-04-18 DIAGNOSIS — R0902 Hypoxemia: Secondary | ICD-10-CM | POA: Diagnosis not present

## 2021-04-18 DIAGNOSIS — M79642 Pain in left hand: Secondary | ICD-10-CM | POA: Diagnosis not present

## 2021-04-18 DIAGNOSIS — I491 Atrial premature depolarization: Secondary | ICD-10-CM | POA: Diagnosis not present

## 2021-04-18 DIAGNOSIS — R0681 Apnea, not elsewhere classified: Secondary | ICD-10-CM | POA: Diagnosis not present

## 2021-04-18 DIAGNOSIS — S0990XA Unspecified injury of head, initial encounter: Secondary | ICD-10-CM | POA: Diagnosis not present

## 2021-04-18 DIAGNOSIS — S0121XA Laceration without foreign body of nose, initial encounter: Secondary | ICD-10-CM | POA: Insufficient documentation

## 2021-04-18 LAB — CBC WITH DIFFERENTIAL/PLATELET
Abs Immature Granulocytes: 0.04 10*3/uL (ref 0.00–0.07)
Basophils Absolute: 0.1 10*3/uL (ref 0.0–0.1)
Basophils Relative: 1 %
Eosinophils Absolute: 0.2 10*3/uL (ref 0.0–0.5)
Eosinophils Relative: 2 %
HCT: 43.2 % (ref 36.0–46.0)
Hemoglobin: 13.8 g/dL (ref 12.0–15.0)
Immature Granulocytes: 0 %
Lymphocytes Relative: 30 %
Lymphs Abs: 3.4 10*3/uL (ref 0.7–4.0)
MCH: 28.8 pg (ref 26.0–34.0)
MCHC: 31.9 g/dL (ref 30.0–36.0)
MCV: 90.2 fL (ref 80.0–100.0)
Monocytes Absolute: 1 10*3/uL (ref 0.1–1.0)
Monocytes Relative: 9 %
Neutro Abs: 6.8 10*3/uL (ref 1.7–7.7)
Neutrophils Relative %: 58 %
Platelets: 244 10*3/uL (ref 150–400)
RBC: 4.79 MIL/uL (ref 3.87–5.11)
RDW: 15.8 % — ABNORMAL HIGH (ref 11.5–15.5)
WBC: 11.5 10*3/uL — ABNORMAL HIGH (ref 4.0–10.5)
nRBC: 0 % (ref 0.0–0.2)

## 2021-04-18 LAB — BASIC METABOLIC PANEL
Anion gap: 9 (ref 5–15)
BUN: 14 mg/dL (ref 8–23)
CO2: 22 mmol/L (ref 22–32)
Calcium: 9.3 mg/dL (ref 8.9–10.3)
Chloride: 107 mmol/L (ref 98–111)
Creatinine, Ser: 1.12 mg/dL — ABNORMAL HIGH (ref 0.44–1.00)
GFR, Estimated: 52 mL/min — ABNORMAL LOW (ref >60)
Glucose, Bld: 135 mg/dL — ABNORMAL HIGH (ref 70–99)
Potassium: 3.9 mmol/L (ref 3.5–5.1)
Sodium: 138 mmol/L (ref 135–145)

## 2021-04-18 MED ORDER — BACITRACIN ZINC 500 UNIT/GM EX OINT: 1.0000 "application " | TOPICAL_OINTMENT | Freq: Two times a day (BID) | CUTANEOUS | 0 refills | Status: DC

## 2021-04-18 MED ORDER — ACETAMINOPHEN 325 MG PO TABS
650.0000 mg | ORAL_TABLET | Freq: Once | ORAL | Status: AC
  Administered 2021-04-18: 650 mg via ORAL
  Filled 2021-04-18: qty 2

## 2021-04-18 MED ORDER — BACITRACIN ZINC 500 UNIT/GM EX OINT: TOPICAL_OINTMENT | Freq: Two times a day (BID) | CUTANEOUS | Status: DC

## 2021-04-18 MED ORDER — TETANUS-DIPHTH-ACELL PERTUSSIS 5-2.5-18.5 LF-MCG/0.5 IM SUSY
0.5000 mL | PREFILLED_SYRINGE | Freq: Once | INTRAMUSCULAR | Status: AC
  Administered 2021-04-18: 0.5 mL via INTRAMUSCULAR
  Filled 2021-04-18: qty 0.5

## 2021-04-18 NOTE — ED Notes (Signed)
1302 - Transported to CT by TRN

## 2021-04-18 NOTE — ED Provider Notes (Signed)
[facility] EMERGENCY DEPARTMENT Provider Note   CSN: [id number] Arrival date & time: 04/18/21  1244     History No chief complaint on file.   Tracy [name] is a 75 y.o. female.  HPI    75 year old female comes in a chief complaint of fall. Patient was with her friend, when she suddenly change her position, she got dizzy and fell.  She fell onto the ground and struck her head onto the grassy surface.  She is complaining of mild headache.  No loss of consciousness.  Patient is on Plavix for CAD.  In addition to mild headache, she is complaining of pain over her left hand.  Unknown last tetanus shot. No past medical history on file.  There are no problems to display for this patient.      OB History   No obstetric history on file.     No family history on file.     Home Medications Prior to Admission medications   Medication Sig Start Date End Date Taking? Authorizing Provider  bacitracin ointment Apply 1 application topically 2 (two) times daily. 04/18/21  Yes Varney Biles, MD    Allergies    Patient has no allergy information on record.  Review of Systems   Review of Systems  Constitutional:  Positive for activity change.  Eyes:  Negative for photophobia and visual disturbance.  Gastrointestinal:  Negative for nausea and vomiting.  Neurological:  Positive for dizziness and headaches.  Hematological:  Bruises/bleeds easily.  All other systems reviewed and are negative.  Physical Exam Updated Vital Signs BP (!) 158/90   Pulse 100   Resp (!) 21   Ht 5\' 5"  (1.651 m)   Wt 91.2 kg   SpO2 95%   BMI 33.45 kg/m   Physical Exam Vitals and nursing note reviewed.  Constitutional:      Appearance: She is well-developed.  HENT:     Head: Normocephalic.     Comments: Left periorbital ecchymosis and a superficial 1 cm laceration/skin tear to the nose Eyes:     Extraocular Movements: Extraocular movements intact.     Pupils: Pupils are  equal, round, and reactive to light.     Comments: Bedside visual acuity screen is normal  Neck:     Comments: No midline c-spine tenderness Cardiovascular:     Rate and Rhythm: Normal rate and regular rhythm.     Heart sounds: No murmur heard. Pulmonary:     Effort: Pulmonary effort is normal. No respiratory distress.     Breath sounds: Normal breath sounds.  Chest:     Chest wall: No tenderness.  Abdominal:     General: Bowel sounds are normal. There is no distension.     Palpations: Abdomen is soft.     Tenderness: There is no abdominal tenderness.  Musculoskeletal:        General: No deformity.     Cervical back: Neck supple. No tenderness.     Comments: Superficial laceration to the distal small digit of the left hand, with no nailbed involvement.  No long bone tenderness - upper and lower extrmeities and no pelvic pain, instability.  Skin:    General: Skin is warm and dry.     Findings: No rash.  Neurological:     Mental Status: She is alert and oriented to person, place, and time.     Cranial Nerves: No cranial nerve deficit.    ED Results / Procedures / Treatments   Labs (  all labs ordered are listed, but only abnormal results are displayed) Labs Reviewed  BASIC METABOLIC PANEL - Abnormal; Notable for the following components:      Result Value   Glucose, Bld 135 (*)    Creatinine, Ser 1.12 (*)    GFR, Estimated 52 (*)    All other components within normal limits  CBC WITH DIFFERENTIAL/PLATELET - Abnormal; Notable for the following components:   WBC 11.5 (*)    RDW 15.8 (*)    All other components within normal limits    EKG None  Radiology CT Head Wo Contrast  Result Date: 04/18/2021 CLINICAL DATA:  Fall with face and head trauma EXAM: CT HEAD WITHOUT CONTRAST CT MAXILLOFACIAL WITHOUT CONTRAST TECHNIQUE: Multidetector CT imaging of the head and maxillofacial structures were performed using the standard protocol without intravenous contrast. Multiplanar CT  image reconstructions of the maxillofacial structures were also generated. COMPARISON:  None. FINDINGS: CT HEAD FINDINGS Brain: No evidence of acute infarction, hemorrhage, hydrocephalus, extra-axial collection or mass lesion/mass effect. Vascular: No hyperdense vessel or unexpected calcification. Skull: Normal. Negative for fracture or focal lesion. Other: None. CT MAXILLOFACIAL FINDINGS Osseous: No fracture or mandibular dislocation. No destructive process. Orbits: Negative. No traumatic or inflammatory finding. Sinuses: Clear. Soft tissues: Negative. IMPRESSION: CT HEAD 1. Negative head CT CT FACE 1. No acute fracture or malalignment. Electronically Signed   By: Jacqulynn Cadet M.D.   On: 04/18/2021 13:27   DG Chest Port 1 View  Result Date: 04/18/2021 CLINICAL DATA:  Fall. EXAM: PORTABLE CHEST 1 VIEW COMPARISON:  October 05, 2020. FINDINGS: The heart size and mediastinal contours are within normal limits. Both lungs are clear. The visualized skeletal structures are unremarkable. IMPRESSION: No active disease. Electronically Signed   By: Marijo Conception M.D.   On: 04/18/2021 13:19   DG Hand Complete Left  Result Date: 04/18/2021 CLINICAL DATA:  Left hand pain after fall. EXAM: LEFT HAND - COMPLETE 3+ VIEW COMPARISON:  None. FINDINGS: There is no evidence of fracture or dislocation. Gull wing deformity is seen involving the third distal interphalangeal joint. Soft tissues are unremarkable. 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 No acute abnormality is noted. Electronically Signed   By: Marijo Conception M.D.   On: 04/18/2021 13:19   CT Maxillofacial Wo Contrast  Result Date: 04/18/2021 CLINICAL DATA:  Fall with face and head trauma EXAM: CT HEAD WITHOUT CONTRAST CT MAXILLOFACIAL WITHOUT CONTRAST TECHNIQUE: Multidetector CT imaging of the head and maxillofacial structures were performed using the standard protocol without intravenous contrast.  Multiplanar CT image reconstructions of the maxillofacial structures were also generated. COMPARISON:  None. FINDINGS: CT HEAD FINDINGS Brain: No evidence of acute infarction, hemorrhage, hydrocephalus, extra-axial collection or mass lesion/mass effect. Vascular: No hyperdense vessel or unexpected calcification. Skull: Normal. Negative for fracture or focal lesion. Other: None. CT MAXILLOFACIAL FINDINGS Osseous: No fracture or mandibular dislocation. No destructive process. Orbits: Negative. No traumatic or inflammatory finding. Sinuses: Clear. Soft tissues: Negative. IMPRESSION: CT HEAD 1. Negative head CT CT FACE 1. No acute fracture or malalignment. Electronically Signed   By: Jacqulynn Cadet M.D.   On: 04/18/2021 13:27    Procedures Procedures   Medications Ordered in ED Medications  bacitracin ointment (has no administration in time range)  Tdap (BOOSTRIX) injection 0.5 mL (0.5 mLs Intramuscular Given 04/18/21 1310)  acetaminophen (TYLENOL) tablet 650 mg (650 mg Oral Given 04/18/21 1414)    ED Course  I have reviewed the triage vital  signs and the nursing notes.  Pertinent labs & imaging results that were available during my care of the patient were reviewed by me and considered in my medical decision making (see chart for details).    MDM Rules/Calculators/A&P                          75 year old female comes in a chief complaint of fall.  She is on Plavix, level 2 trauma was activated.  CT scan of the brain, face were ordered and they are unremarkable.  There is no orbital blowout fracture and gross vision exam is normal.  Patient's fall was because of dizzy.  She has had 2 or 3 episodes of provoked dizziness when she turns her head.  Her neuro exam is nonfocal at this time and she is not dizzy.  Does not appear that the underlying dizziness is described as vertigo.  Advised her to follow-up with PCP to see if she needs vascular studies of the neck.  Couple of areas of superficial  lacerations that do not need sutures.  Patient is comfortable with managing those wounds herself with [place] prescribing her over-the-counter bacitracin ointment.  Strict ER return precautions have been discussed, and patient is agreeing with the plan and is comfortable with the workup done and the recommendations from the ER.   Final Clinical Impression(s) / ED Diagnoses Final diagnoses:  Contusion of face, initial encounter  Hand injuries, left, initial encounter  Dizzy spells    Rx / DC Orders ED Discharge Orders          Ordered    bacitracin ointment  2 times daily        04/18/21 1428             Varney Biles, MD 04/18/21 1435

## 2021-04-18 NOTE — Progress Notes (Signed)
   04/18/21 1200  Clinical Encounter Type  Visited With Patient not available  Visit Type Trauma  Referral From Nurse  Consult/Referral To Chaplain  The chaplain responded to fall on thinners. The patient is being assessed. No chaplain services are needed at this time.

## 2021-04-18 NOTE — Progress Notes (Signed)
Orthopedic Tech Progress Note Patient Details:  Tracy [name] 12/12/1945 [id number]  Level 2 trauma  Patient ID: [name], female   DOB: 01/28/1946, 75 y.o.   MRN: [id number]  Tracy [name] 04/18/2021, 12:58 PM

## 2021-04-18 NOTE — ED Notes (Signed)
Ambulated to bathroom - from wheelchair. Denies dizziness, one assist to transfer-- states is still worried that she will fall again.

## 2021-04-18 NOTE — Discharge Instructions (Addendum)
You are seen in the ER after you sustained a fall from a dizzy spell.  From the fall itself, there is no clinically significant traumatic injury.  You likely have contusion, hematoma that will resolve on its own.  Tylenol for pain and ice for swelling is recommended. \  Keep the laceration to your nose clean and dry.  Apply antibiotic ointment.  Finally, your dizzy spells appear to be provoked by turning her head quickly.  You might want to see your primary care doctor to see if you need imaging of your neck vessels.

## 2021-04-20 ENCOUNTER — Ambulatory Visit (INDEPENDENT_AMBULATORY_CARE_PROVIDER_SITE_OTHER): Payer: Medicare Other | Admitting: Family Medicine

## 2021-04-20 ENCOUNTER — Encounter: Payer: Self-pay | Admitting: Family Medicine

## 2021-04-20 ENCOUNTER — Other Ambulatory Visit: Payer: Self-pay

## 2021-04-20 VITALS — BP 130/80 | HR 105 | Resp 16 | Ht 65.0 in | Wt 201.1 lb

## 2021-04-20 DIAGNOSIS — R251 Tremor, unspecified: Secondary | ICD-10-CM

## 2021-04-20 DIAGNOSIS — I10 Essential (primary) hypertension: Secondary | ICD-10-CM

## 2021-04-20 DIAGNOSIS — I25119 Atherosclerotic heart disease of native coronary artery with unspecified angina pectoris: Secondary | ICD-10-CM

## 2021-04-20 DIAGNOSIS — R42 Dizziness and giddiness: Secondary | ICD-10-CM

## 2021-04-20 DIAGNOSIS — H93A3 Pulsatile tinnitus, bilateral: Secondary | ICD-10-CM

## 2021-04-20 DIAGNOSIS — R000 Tachycardia, unspecified: Secondary | ICD-10-CM | POA: Diagnosis not present

## 2021-04-20 LAB — TSH: TSH: 1.71 u[IU]/mL (ref 0.35–5.50)

## 2021-04-20 NOTE — Progress Notes (Signed)
Chief Complaint  Patient presents with   Dizziness    Ongoing for 2 weeks, first noticed it when she was driving. Was out working in the yard on Sunday and she fell.   Hospitalization Follow-up   HPI: [name] is a 75 y.o. female with hx of COPD,CAD,CKD,HFrEF,HTN,and anxiety here today complaining of episodes of dizziness as described above. Problem has been intermittent for 2 weeks. Sudden onset, it is not like spinning, more like lightheadedness. Associated pulsatile noise in ears, no hx of tinnitus. Episodes last a few minutes. No associated headache, visual changes, changes in hearing,CP,palpitations,SOB,diaphoresis,N/V,bladder/bowel dysfunction.  She was evaluated in the ED on 04/18/2021. She was walking on her front yard, turned her head and felt dizzy, lost balance and fell. She landed on grass, hit head, broke her eye glasses. She has ecchymosis left periocular and forehead. "Slight" headache.  No LOC or MS changes.  Head and maxillofacial CT were otherwise negative. CXR: Both lungs clear. 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  No acute abnormalities noted.  Lab Results  Component Value Date   WBC 11.5 (H) 04/18/2021   HGB 13.8 04/18/2021   HCT 43.2 04/18/2021   MCV 90.2 04/18/2021   PLT 244 04/18/2021   Tachycardia: Mildly elevated HR, low 100's usually when she checks it at home. BP mildly elevated during ER visit. Right hand tremor,intermittent for a while. No known FHx of tremor. Left handed.  She is on Metoprolol succinate 100 mg daily,Amlodipine 5 mg daily,Losartan 100 mg daily,and Imdur 30 mg daily. Negative for orthopnea,PND,neurologic focal deficit or edema.  Lab Results  Component Value Date   CREATININE 1.12 (H) 04/18/2021   BUN 14 04/18/2021   NA 138 04/18/2021   K 3.9 04/18/2021   CL 107 04/18/2021   CO2 22 04/18/2021   Review of Systems  Constitutional:  Positive  for fatigue. Negative for appetite change and fever.  HENT:  Negative for mouth sores, nosebleeds and sore throat.   Respiratory:  Negative for cough and wheezing.   Gastrointestinal:  Negative for abdominal pain.       Negative for changes in bowel habits.  Genitourinary:  Negative for decreased urine volume, dysuria and hematuria.  Musculoskeletal:  Positive for arthralgias and gait problem.  Skin:  Negative for rash.  Neurological:  Negative for syncope and facial asymmetry.  Psychiatric/Behavioral:  Negative for confusion. The patient is nervous/anxious.   Rest see pertinent positives and negatives per HPI.  Current Outpatient Medications on File Prior to Visit  Medication Sig Dispense Refill   acetaminophen (TYLENOL) 500 MG tablet Take 500-1,000 mg by mouth every 6 (six) hours as needed for headache.      albuterol (PROVENTIL) (2.5 MG/3ML) 0.083% nebulizer solution Take 3 mLs (2.5 mg total) by nebulization every 6 (six) hours as needed for wheezing or shortness of breath. 300 mL 5   albuterol (VENTOLIN HFA) 108 (90 Base) MCG/ACT inhaler Inhale 2 puffs into the lungs every 4 (four) hours as needed for wheezing. 1 each 3   allopurinol (ZYLOPRIM) 100 MG tablet TAKE 1 TABLET BY MOUTH EVERY DAY 90 tablet 0   amLODipine (NORVASC) 5 MG tablet Take 1 tablet (5 mg total) by mouth daily. 90 tablet 3   ANORO ELLIPTA 62.5-25 MCG/INH AEPB INHALE 1 PUFF BY MOUTH EVERY DAY 60 each 12   APPLE CIDER VINEGAR PO Take 2,400 mg by mouth daily.     ASPERCREME LIDOCAINE EX Apply 1  application topically daily as needed (pain).     aspirin EC 81 MG tablet Take 81 mg by mouth daily.     bacitracin ointment Apply 1 application topically 2 (two) times daily. 14 g 0   calcium carbonate (CALCIUM 600) 600 MG TABS tablet Take 600 mg by mouth See admin instructions. Take 600 mg every 2 months     Camphor-Eucalyptus-Menthol (VICKS VAPORUB EX) Apply 1 application topically daily as needed (congestion).     cetirizine  (ZYRTEC) 10 MG tablet Take 10 mg by mouth daily.     Cholecalciferol (VITAMIN D3) 50 MCG (2000 UT) TABS Take 4,000 Units by mouth daily.      clopidogrel (PLAVIX) 75 MG tablet TAKE 1 TABLET (75 MG TOTAL) BY MOUTH DAILY WITH BREAKFAST. 90 tablet 3   Cyanocobalamin (VITAMIN B-12) 5000 MCG SUBL Place 5,000 mcg under the tongue daily.      ezetimibe (ZETIA) 10 MG tablet TAKE 1 TABLET BY MOUTH DAILY 90 tablet 3   fluticasone (FLONASE) 50 MCG/ACT nasal spray SPRAY 2 SPRAYS INTO EACH NOSTRIL EVERY DAY (Patient taking differently: Place 2 sprays into both nostrils daily.) 48 mL 1   furosemide (LASIX) 40 MG tablet Take 40 mg by mouth daily as needed for fluid or edema.     guaiFENesin (MUCINEX) 600 MG 12 hr tablet Take 600 mg by mouth 2 (two) times daily as needed (congestion).     HYDROcodone-acetaminophen (NORCO/VICODIN) 5-325 MG tablet Take 1 tablet by mouth daily as needed for moderate pain. 30 tablet 0   hydrocortisone (PROCTOSOL HC) 2.5 % rectal cream Place 1 application rectally 2 (two) times daily. 30 g 0   isosorbide mononitrate (IMDUR) 30 MG 24 hr tablet Take 1 tablet (30 mg total) by mouth daily. 90 tablet 3   losartan (COZAAR) 100 MG tablet TAKE 1 TABLET BY MOUTH EVERY DAY (Patient taking differently: Take 100 mg by mouth daily.) 90 tablet 3   Menthol-Methyl Salicylate (SALONPAS PAIN RELIEF PATCH EX) Apply 1 patch topically daily as needed (pain).     metoprolol succinate (TOPROL-XL) 100 MG 24 hr tablet TAKE 1 TABLET BY MOUTH DAILY. TAKE WITH OR IMMEDIATELY FOLLOWING A MEAL. 90 tablet 2   Misc Natural Products (TART CHERRY ADVANCED) CAPS Take 1,000 capsules by mouth daily.      oxymetazoline (AFRIN) 0.05 % nasal spray Place 1 spray into both nostrils 2 (two) times daily as needed for congestion.     pantoprazole (PROTONIX) 40 MG tablet TAKE 1 TABLET BY MOUTH EVERY DAY 90 tablet 2   potassium chloride SA (KLOR-CON) 20 MEQ tablet Take 20 mEq by mouth daily.     pravastatin (PRAVACHOL) 40 MG tablet  Take 1 tablet (40 mg total) by mouth every evening. 90 tablet 3   Simethicone (GAS-X PO) Take 2 tablets by mouth daily as needed (gas).     [DISCONTINUED] nitroGLYCERIN (NITROSTAT) 0.4 MG SL tablet Place 1 tablet (0.4 mg total) under the tongue every 5 (five) minutes as needed for chest pain. 25 tablet 3   No current facility-administered medications on file prior to visit.   Past Medical History:  Diagnosis Date   Allergic rhinitis    Anxiety    Arthritis    Bronchitis    CAD (coronary artery disease)    1/19 PCI/DES to [place] for ISR, normal EF.    Cancer ([place])    hx of precancerous cells in right breast    Cervical dysplasia    unsure of procedure, possible "  burning" in her late 61s   CHF (congestive heart failure) ([place])    Echo 06/2019: EF 55-60, elevated LVEDP, normal RV SF, mild MAC, mild MR, trivial TR   Complication of anesthesia    COPD (chronic obstructive pulmonary disease) (HCC)    early    Dyspnea    with exertion    Family history of adverse reaction to anesthesia    daughter- problems wiht n/v   GERD (gastroesophageal reflux disease)    Gout    Headache(784.0)    Heart murmur    hx of years ago    Hyperlipidemia    Hypertension    Low back pain    Menopausal syndrome    Myocardial infarct ([place]) 2007   hx of   Overactive bladder    PONV (postoperative nausea and vomiting)    Allergies  Allergen Reactions   Erythromycin Rash    But isn't certain   Sulfamethoxazole Rash    Social History   Socioeconomic History   Marital status: Married    Spouse name: Not on file   Number of children: 2   Years of education: Not on file   Highest education level: Not on file  Occupational History   Occupation: retired  Tobacco Use   Smoking status: Former    Packs/day: 1.25    Years: 52.00    Pack years: 65.00    Types: Cigarettes    Quit date: 2018    Years since quitting: 4.5   Smokeless tobacco: Never   Tobacco comments:    completely quit May of 2018;  period of years she did not smoke   Vaping Use   Vaping Use: Never used  Substance and Sexual Activity   Alcohol use: Yes    Comment: seldom   Drug use: No   Sexual activity: Not Currently  Other Topics Concern   Not on file  Social History Narrative   Not on file   Social Determinants of Health   Financial Resource Strain: Not on file  Food Insecurity: Not on file  Transportation Needs: Not on file  Physical Activity: Not on file  Stress: Not on file  Social Connections: Not on file    Vitals:   04/20/21 1116  BP: 130/80  Pulse: (!) 105  Resp: 16  SpO2: 92%   Body mass index is 33.46 kg/m.  Physical Exam Vitals and nursing note reviewed.  Constitutional:      General: She is not in acute distress.    Appearance: She is well-developed.  HENT:     Head: Normocephalic and atraumatic.     Mouth/Throat:     Mouth: Mucous membranes are moist.     Pharynx: Oropharynx is clear.  Eyes:     Conjunctiva/sclera: Conjunctivae normal.  Neck:     Comments: I did not appreciate carotid bruit. Cardiovascular:     Rate and Rhythm: Regular rhythm. Tachycardia present. Occasional Extrasystoles are present.    Heart sounds: No murmur heard. Pulmonary:     Effort: Pulmonary effort is normal. No respiratory distress.     Breath sounds: Normal breath sounds.  Abdominal:     Palpations: Abdomen is soft. There is no hepatomegaly or mass.     Tenderness: There is no abdominal tenderness.  Musculoskeletal:     Left lower leg: 1+ Pitting Edema present.  Lymphadenopathy:     Cervical: No cervical adenopathy.  Skin:    General: Skin is warm.     Findings: Abrasion  and ecchymosis (Left periocular and left hand.) present. No erythema or rash.     Comments: Small linear healing wound in between eye brows,beginning of nose bridge.  Neurological:     General: No focal deficit present.     Mental Status: She is alert and oriented to person, place, and time.     Cranial Nerves: No  cranial nerve deficit.     Motor: Tremor (Right hand, not present at rest.) present.     Gait: Gait normal.  Psychiatric:     Comments: Well groomed, good eye contact.   ASSESSMENT AND PLAN:  [name] was seen today for dizziness and hospitalization follow-up.  Diagnoses and all orders for this visit: Orders Placed This Encounter  Procedures   TSH   VAS US CAROTID   Lab Results  Component Value Date   TSH 1.71 04/20/2021   Tremor ? Essential tremor. Other possible etiologies discussed. Further recommendations according to TSH result.  Pulsatile tinnitus of both ears New problem. Carotid artery stenosis to be consider. Carotid Duplex will be arranged.  Dizziness We discussed possible etiologies. Fall precautions. Instructed about warning signs.  Tachycardia It seems to be chronic, mild. Adequate hydration. Continue Metoprolol succinate 100 mg daily. Continue monitoring HR.  Essential hypertension BP adequately controlled today. Continue monitoring BP. Low salt diet. No changes in Metoprolol succinate,Amlodipine,Losartan,or Imdur dose.  Return if symptoms worsen or fail to improve, for Keep next appt.   Annaleah Arata G. [name], MD  [facility]. [place] office.

## 2021-04-20 NOTE — Patient Instructions (Addendum)
A few things to remember from today's visit:   Tremor  Pulsatile tinnitus of both ears  Dizziness  Tachycardia  If you need refills please call your pharmacy. Do not use My Chart to request refills or for acute issues that need immediate attention.   Continue monitoring heart rate. No changes in your medications for now. Fall precautions. Tremor A tremor is trembling or shaking that you cannot control. Most tremors affect the hands or arms. Tremors can also affect the head, vocal cords, face, and other parts of the body. There are many types of tremors. Common types include: Essential tremor. These usually occur in people older than 40. It may run in families and can happen in otherwise healthy people. Resting tremor. These occur when the muscles are at rest, such as when your hands are resting in your lap. People with Parkinson's disease often have resting tremors. Postural tremor. These occur when you try to hold a pose, such as keeping your hands outstretched. Kinetic tremor. These occur during purposeful movement, such as trying to touch a finger to your nose. Task-specific tremor. These may occur when you perform certain tasks such as writing, speaking, or standing. Psychogenic tremor. These dramatically lessen or disappear when you are distracted. They can happen in people of all ages. Some types of tremors have no known cause. Tremors can also be a symptom of nervous system problems (neurological disorders) that may occur with aging. Some tremors go away with treatment, while othersdo not. Follow these instructions at home: Lifestyle     Limit alcohol intake to no more than 1 drink a day for nonpregnant women and 2 drinks a day for men. One drink equals 12 oz of beer, 5 oz of wine, or 1 oz of hard liquor. Do not use any products that contain nicotine or tobacco, such as cigarettes and e-cigarettes. If you need help quitting, ask your health care provider. Avoid extreme heat  and extreme cold. Limit your caffeine intake, as told by your health care provider. Try to get 8 hours of sleep each night. Find ways to manage your stress, such as meditation or yoga. General instructions Take over-the-counter and prescription medicines only as told by your health care provider. Keep all follow-up visits as told by your health care provider. This is important. Contact a health care provider if you: Develop a tremor after starting a new medicine. Have a tremor along with other symptoms such as: Numbness. Tingling. Pain. Weakness. Notice that your tremor gets worse. Notice that your tremor interferes with your day-to-day life. Summary A tremor is trembling or shaking that you cannot control. Most tremors affect the hands or arms. Some types of tremors have no known cause. Others may be a symptom of nervous system problems (neurological disorders). Make sure you discuss any tremors you have with your health care provider. This information is not intended to replace advice given to you by your health care provider. Make sure you discuss any questions you have with your healthcare provider. Document Revised: 06/26/2020 Document Reviewed: 06/26/2020 Elsevier Patient Education  2022 [place].  Please be sure medication list is accurate. If a new problem present, please set up appointment sooner than planned today.

## 2021-04-21 ENCOUNTER — Ambulatory Visit (HOSPITAL_COMMUNITY)
Admission: RE | Admit: 2021-04-21 | Discharge: 2021-04-21 | Disposition: A | Discharge status: Home / Self Care | Payer: Medicare Other | Source: Ambulatory Visit | Attending: Internal Medicine | Admitting: Internal Medicine

## 2021-04-21 DIAGNOSIS — H93A3 Pulsatile tinnitus, bilateral: Secondary | ICD-10-CM

## 2021-04-21 DIAGNOSIS — R42 Dizziness and giddiness: Secondary | ICD-10-CM | POA: Insufficient documentation

## 2021-04-26 ENCOUNTER — Encounter (HOSPITAL_COMMUNITY): Payer: Self-pay

## 2021-04-27 ENCOUNTER — Telehealth: Payer: Self-pay | Admitting: *Deleted

## 2021-04-27 NOTE — Chronic Care Management (AMB) (Signed)
  Chronic Care Management   Note  04/27/2021 Name: Tracy [name] MRN: [id number] DOB: Jul 15, 1946  Tracy [name] is a 75 y.o. year old female who is a primary care patient of [name], Malka So, MD. I reached out to [name] by phone today in response to a referral sent by Ms. Avelino Leeds PCP, Dr. [name]      Tracy [name] was given information about Chronic Care Management services today including:  CCM service includes personalized support from designated clinical staff supervised by her physician, including individualized plan of care and coordination with other care providers 24/7 contact phone numbers for assistance for urgent and routine care needs. Service will only be billed when office clinical staff spend 20 minutes or more in a month to coordinate care. Only one practitioner may furnish and bill the service in a calendar month. The patient may stop CCM services at any time (effective at the end of the month) by phone call to the office staff. The patient will be responsible for cost sharing (co-pay) of up to 20% of the service fee (after annual deductible is met).  Patient did not agree to enrollment in care management services and does not wish to consider at this time.  Follow up plan: Patient declines engagement by the care management team. Appropriate care team members and provider have been notified via electronic communication. The care management team is available to follow up with the patient after provider conversation with the patient regarding recommendation for care management engagement and subsequent re-referral to the care management team.   [place] [facility]

## 2021-04-28 ENCOUNTER — Telehealth: Payer: Self-pay | Admitting: Family Medicine

## 2021-04-28 MED ORDER — MECLIZINE HCL 25 MG PO TABS: ORAL_TABLET | ORAL | 0 refills | Status: DC

## 2021-04-28 NOTE — Telephone Encounter (Signed)
I called and spoke with patient, we went over her lab results & she verbalized understanding. She was wanting to know if there was any medication she could take for her vertigo. I spoke with Dr. [name] who okayed an Rx for Meclizine 25 mg with a sig of 1/2-1 tablet by mouth twice a day as needed. I called patient back to let her know and to confirm her pharmacy. Advised patient that the medication could make her drowsy, so to start with 1/2 tablet. Patient verbalized understanding. [facility] into the [facility] on [address]

## 2021-04-28 NOTE — Telephone Encounter (Signed)
Pt call and stated she need a call back about her labs and image reports she stated it has been over a week.

## 2021-05-05 ENCOUNTER — Other Ambulatory Visit: Payer: Self-pay | Admitting: *Deleted

## 2021-05-05 DIAGNOSIS — E785 Hyperlipidemia, unspecified: Secondary | ICD-10-CM

## 2021-05-13 ENCOUNTER — Telehealth: Payer: Self-pay

## 2021-05-13 ENCOUNTER — Ambulatory Visit (INDEPENDENT_AMBULATORY_CARE_PROVIDER_SITE_OTHER): Payer: Medicare Other | Admitting: Internal Medicine

## 2021-05-13 ENCOUNTER — Encounter: Payer: Self-pay | Admitting: Internal Medicine

## 2021-05-13 VITALS — BP 132/70 | HR 95 | Ht 65.0 in | Wt 189.8 lb

## 2021-05-13 DIAGNOSIS — K5900 Constipation, unspecified: Secondary | ICD-10-CM | POA: Diagnosis not present

## 2021-05-13 DIAGNOSIS — Z7902 Long term (current) use of antithrombotics/antiplatelets: Secondary | ICD-10-CM | POA: Diagnosis not present

## 2021-05-13 DIAGNOSIS — R1030 Lower abdominal pain, unspecified: Secondary | ICD-10-CM

## 2021-05-13 DIAGNOSIS — I25119 Atherosclerotic heart disease of native coronary artery with unspecified angina pectoris: Secondary | ICD-10-CM | POA: Diagnosis not present

## 2021-05-13 DIAGNOSIS — K219 Gastro-esophageal reflux disease without esophagitis: Secondary | ICD-10-CM | POA: Diagnosis not present

## 2021-05-13 DIAGNOSIS — Z86010 Personal history of colonic polyps: Secondary | ICD-10-CM

## 2021-05-13 MED ORDER — PEG 3350-KCL-NA BICARB-NACL 420 G PO SOLR: 4000.0000 mL | Freq: Once | ORAL | 0 refills | Status: AC

## 2021-05-13 NOTE — Telephone Encounter (Signed)
[facility] Pre-operative Risk Assessment     Request for surgical clearance:     Endoscopy Procedure  What type of surgery is being performed?     Colonoscopy  When is this surgery scheduled?     08-05-2021  What type of clearance is required ?   Pharmacy  Are there any medications that need to be held prior to surgery and how long? Plavix x 7 days  Practice name and name of physician performing surgery?      [facility]  What is your office phone and fax number?      Phone- [phone]  [phone]  Anesthesia type (None, local, MAC, general) ?       MAC

## 2021-05-13 NOTE — Patient Instructions (Addendum)
If you are age 73 or older, your body mass index should be between 23-30. Your Body mass index is 31.58 kg/m. If this is out of the aforementioned range listed, please consider follow up with your Primary Care Provider. __________________________________________________________  The [facility] providers would like to encourage you to use [facility] to communicate with providers for non-urgent requests or questions.  Due to long hold times on the telephone, sending your provider a message by [facility] may be a faster and more efficient way to get a response.  Please allow 48 business hours for a response.  Please remember that this is for non-urgent requests.   You have been scheduled for a colonoscopy. Please follow written instructions given to you at your visit today.  Please pick up your prep supplies at the pharmacy within the next 1-3 days. If you use inhalers (even only as needed), please bring them with you on the day of your procedure.  Due to recent changes in healthcare laws, you may see the results of your imaging and laboratory studies on MyChart before your provider has had a chance to review them.  We understand that in some cases there may be results that are confusing or concerning to you. Not all laboratory results come back in the same time frame and the provider may be waiting for multiple results in order to interpret others.  Please give [place] 48 hours in order for your provider to thoroughly review all the results before contacting the office for clarification of your results.   You will be contacted by our office prior to your procedure for directions on holding your Plavix.  If you do not hear from our office 1 week prior to your scheduled procedure, please call [phone] to discuss.   Continue Aspirin prior to procedure.  Please purchase the following medications over the counter and take as directed:  START: Miralax daily.  You can adjust the dosage depending on your bowel  habits.  Thank you for entrusting me with your care and choosing [facility].  Dr Henrene Pastor

## 2021-05-13 NOTE — Telephone Encounter (Signed)
Yes ok to hold plavix as requested. Thank you

## 2021-05-13 NOTE — Telephone Encounter (Signed)
   Primary Cardiologist: Sherren Mocha, MD  Chart reviewed as part of pre-operative protocol coverage. Given past medical history and time since last visit, based on ACC/AHA guidelines, Kyan L Dame would be at acceptable risk for the planned procedure without further cardiovascular testing.   Her Plavix may be held for 7 days prior to her procedure.  Please resume as soon as hemostasis is achieved.  I will route this recommendation to the requesting party via Epic fax function and remove from pre-op pool.  Please call with questions.  Jossie Ng. Conlin Brahm NP-C    05/13/2021, 3:22 PM [facility] [place] [address] Office [phone] Fax [phone]

## 2021-05-13 NOTE — Telephone Encounter (Signed)
Tracy [name] 75 year old female is requesting preoperative cardiac evaluation for colonoscopy.  She was last seen in the clinic on 03/01/2021.  During that time she was being evaluated for hyperlipidemia.  Her atorvastatin has been discontinued due to myalgias and she was transitioned to pravastatin and continued on her ezetimibe.  She was feeling much better after transitioning off atorvastatin.  Her PMH includes dyslipidemia, elevated liver enzymes, coronary artery disease with repeat cardiac catheterization 12/21 which showed no significant stenosis requiring repeat intervention.  Her LVEF was 50% with mild pulmonary hypertension seen on right heart cath.  Her PMH also includes COPD, CKD stage III, hypertension, and aortic atherosclerosis.  May her Plavix be held prior to procedure?  Thank you for your help.  Please direct your response to [facility] preop pool.  Jossie Ng. [name] NP-C    05/13/2021, 1:01 PM [facility] [place] [address] Office [phone] Fax [phone]

## 2021-05-14 NOTE — Telephone Encounter (Signed)
Patient advised that she has been given clearance to hold Plavix 7 days prior to colonoscopy scheduled for 08-05-2021.  Patient advised to take last dose of Plavix on 07-28-2021, and she will be advised when to restart Plavix by Dr Henrene Pastor after the procedure.  Patient agreed to plan and verbalized understanding.  No further questions.

## 2021-05-19 ENCOUNTER — Encounter: Payer: Self-pay | Admitting: Internal Medicine

## 2021-05-19 NOTE — Progress Notes (Signed)
HISTORY OF PRESENT ILLNESS:  Tracy [name] is a 75 y.o. female with multiple significant medical problems as listed below.  She is sent today by her primary provider, Dr. [name], regarding problems with abdominal pain, constipation, and the need for colonoscopy.  The patient underwent abdominal ultrasound to evaluate elevated liver test December 2021.  She was found to have a complex cystic and solid mass in the pelvis which CT scan of recommended.  CT scan performed October 20, 2020 revealed a large complex mass in the central pelvis measuring up to 18.3 cm x 12.6 cm x 19.5 cm.  She subsequently underwent robot-assisted laparoscopic bilateral salpingo-oophorectomy November 10, 2020.  The pathology revealed benign serous cystadenoma of the left and right adnexa.  Since that time she has had problems with abdominal pain and constipation.  Patient has a follow-up CT scan to evaluate the abdominal pain he was performed 822.  No acute intra-abdominal or pelvic pathology noted.  She was seen at her PCP office Feb 22, 2021.  She was prescribed lactulose.  This was not effective.  She has been using Dulcolax.  Review of blood work from April 18, 2021 shows a hemoglobin 13.8.  The patient did undergo colonoscopy in July 2009.  She was found to have severe pandiverticulosis with marked changes in the region of the sigmoid colon.  Issues with constipation at that time.  She did have a sessile serrated adenoma removed.  Follow-up in 5 years recommended.  Patient has not followed up.  She also has a history of GERD which is controlled with pantoprazole.  Upper endoscopy July 2009 revealed a hiatal hernia but was otherwise normal.  Patient feels that she is still recovering from her surgery.  She did undergo cardiac catheterization December 2021.  Ejection fraction 50 to 55%.  She did have a drug-eluting stent placed to her left circumflex artery in January 2019.  She is on both Plavix and aspirin.  This was interrupted for  her surgery.  Last evaluated by her cardiologist Mar 01, 2021.  She does not use oxygen for her COPD.  REVIEW OF SYSTEMS:  All non-GI ROS negative unless otherwise stated in the HPI except for sinus and allergy trouble, anxiety, arthritis,, cough, fatigue, sleeping problems, lower extremity swelling, urinary leakage  Past Medical History:  Diagnosis Date   Allergic rhinitis    Anxiety    Arthritis    Bronchitis    CAD (coronary artery disease)    1/19 PCI/DES to [place] for ISR, normal EF.    Cancer ([place])    hx of precancerous cells in right breast    Cervical dysplasia    unsure of procedure, possible "burning" in her late 93s   CHF (congestive heart failure) ([place])    Echo 06/2019: EF 55-60, elevated LVEDP, normal RV SF, mild MAC, mild MR, trivial TR   Complication of anesthesia    COPD (chronic obstructive pulmonary disease) (HCC)    early    Dyspnea    with exertion    Family history of adverse reaction to anesthesia    daughter- problems wiht n/v   GERD (gastroesophageal reflux disease)    Gout    Headache(784.0)    Heart murmur    hx of years ago    Hyperlipidemia    Hypertension    Low back pain    Menopausal syndrome    Myocardial infarct ([place]) 2007   hx of   Overactive bladder    PONV (postoperative nausea  and vomiting)     Past Surgical History:  Procedure Laterality Date   ANGIOPLASTY     stent 2007   BREAST LUMPECTOMY WITH RADIOACTIVE SEED LOCALIZATION Right 04/28/2020   Procedure: RIGHT BREAST LUMPECTOMY X 2  WITH RADIOACTIVE SEED LOCALIZATION;  Surgeon: Donnie Mesa, MD;  Location: [facility];  Service: General;  Laterality: Right;  LMA   CARDIAC CATHETERIZATION     COLONOSCOPY     CORONARY STENT INTERVENTION N/A 11/06/2017   Procedure: CORONARY STENT INTERVENTION;  Surgeon: [name], MD;  Location: [facility];  Service: Cardiovascular;  Laterality: N/A;   LEFT HEART CATH AND CORONARY ANGIOGRAPHY N/A 11/06/2017   Procedure:  LEFT HEART CATH AND CORONARY ANGIOGRAPHY;  Surgeon: [name], MD;  Location: [facility];  Service: Cardiovascular;  Laterality: N/A;   LEFT HEART CATH AND CORONARY ANGIOGRAPHY N/A 07/24/2019   Procedure: LEFT HEART CATH AND CORONARY ANGIOGRAPHY;  Surgeon: Burnell Blanks, MD;  Location: [facility];  Service: Cardiovascular;  Laterality: N/A;   RIGHT/LEFT HEART CATH AND CORONARY ANGIOGRAPHY N/A 09/29/2020   Procedure: RIGHT/LEFT HEART CATH AND CORONARY ANGIOGRAPHY;  Surgeon: Belva Crome, MD;  Location: [facility];  Service: Cardiovascular;  Laterality: N/A;   ROBOTIC ASSISTED BILATERAL SALPINGO OOPHERECTOMY Bilateral 11/10/2020   Procedure: XI ROBOTIC ASSISTED BILATERAL SALPINGO OOPHORECTOMY WITH MINI LAPAROTOMY FOR DRAINAGE;  Surgeon: Lafonda Mosses, MD;  [facility];  Service: Gynecology;  Laterality: Bilateral;  MINI LAP FIRST   TONSILLECTOMY     VULVECTOMY N/A 11/10/2020   Procedure: WIDE EXCISION VULVECTOMY;  Surgeon: Lafonda Mosses, MD;  [facility];  Service: Gynecology;  Laterality: N/A;    Social History Tracy [name]  reports that she quit smoking about 4 years ago. Her smoking use included cigarettes. She has a 65.00 pack-year smoking history. She has never used smokeless tobacco. She reports current alcohol use. She reports that she does not use drugs.  family history includes Cancer in her mother; Colon cancer (age of onset: 14) in her mother; Depression in her mother; Diabetes in her daughter; Other in an other family member. She was adopted.  Allergies  Allergen Reactions   Erythromycin Rash    But isn't certain   Sulfamethoxazole Rash       PHYSICAL EXAMINATION: Vital signs: BP 132/70   Pulse 95   Ht _0  (1.651 m)   Wt 189 lb 12.8 oz (86.1 kg)   SpO2 95%   BMI 31.58 kg/m   Constitutional: generally well-appearing, no acute distress Psychiatric: alert and oriented x3, cooperative Eyes: extraocular  movements intact, anicteric, conjunctiva pink Mouth: oral pharynx moist, no lesions Neck: supple no lymphadenopathy Cardiovascular: heart regular rate and rhythm, no murmur Lungs: clear to auscultation bilaterally Abdomen: soft, obese, nontender, nondistended, no obvious ascites, no peritoneal signs, normal bowel sounds, no organomegaly Rectal: Deferred until colonoscopy Extremities: no clubbing or cyanosis.  Trace lower extremity edema bilaterally Skin: no lesions on visible extremities Neuro: No focal deficits.  Cranial nerves intact  ASSESSMENT:  1.  Chronic constipation.  Ongoing. 2.  For abdominal pain.  May be secondary to constipation.  May be secondary to healing from recent surgery.  Likely secondary to diverticular disease without diverticulitis. 3.  GERD.  Controlled on PPI.  Prior EGD 2009. 4.  History of sessile serrated adenoma.  2009 colonoscopy.  Overdue for follow-up 5.  Coronary artery disease with remote drug-eluting stent placement January 2019.  On  aspirin and Plavix. 6.  General medical problems 7.  Severe diverticulosis as noted 8.  Status post laparoscopic bilateral salpingo-oophorectomy for large pelvic serous cystadenoma's November 10, 2020  PLAN:  1.  Laxative choice for constipation.  I have recommended MiraLAX daily.  Titrate to need. 2.  Reflux precautions 3.  Continue PPI 4.  Schedule surveillance colonoscopy (PEDIATRIC COLONOSCOPE) for history of serrated adenoma.  As well to further evaluate issues with constipation pain.  Patient is HIGH RISK given her comorbidities and the need to address her Plavix therapy. 5.  Would like to hold Plavix 1 week prior to the procedure.  Continue on aspirin throughout.  We have conferred with cardiology who has approved this.  The patient has been informed. 6.  Ongoing general medical care with PCP and other specialists.  A total time of 60 minutes was spent preparing to see the patient, reviewing x-rays, laboratories,  operative reports, pathology, and endoscopy reports.  Also, obtaining comprehensive history, reviewing outside office evaluations elsewhere, performing medically appropriate physical examination, counseling the patient and her family regarding her above listed issues and plans, ordering advanced procedures, and documenting clinical information in the health record

## 2021-05-25 ENCOUNTER — Other Ambulatory Visit: Payer: Self-pay | Admitting: Family Medicine

## 2021-06-03 DIAGNOSIS — E785 Hyperlipidemia, unspecified: Secondary | ICD-10-CM | POA: Diagnosis not present

## 2021-06-03 LAB — LIPID PANEL
Chol/HDL Ratio: 3.3 ratio (ref 0.0–4.4)
Cholesterol, Total: 154 mg/dL (ref 100–199)
HDL: 46 mg/dL (ref >39)
LDL Chol Calc (NIH): 86 mg/dL (ref 0–99)
Triglycerides: 120 mg/dL (ref 0–149)
VLDL Cholesterol Cal: 22 mg/dL (ref 5–40)

## 2021-06-04 ENCOUNTER — Encounter: Payer: Self-pay | Admitting: Internal Medicine

## 2021-06-04 ENCOUNTER — Other Ambulatory Visit: Payer: Self-pay

## 2021-06-04 ENCOUNTER — Ambulatory Visit (INDEPENDENT_AMBULATORY_CARE_PROVIDER_SITE_OTHER): Payer: Medicare Other | Admitting: Internal Medicine

## 2021-06-04 ENCOUNTER — Ambulatory Visit: Payer: Medicare Other | Admitting: Internal Medicine

## 2021-06-04 VITALS — BP 138/74 | HR 100 | Ht 65.0 in | Wt 191.2 lb

## 2021-06-04 DIAGNOSIS — E669 Obesity, unspecified: Secondary | ICD-10-CM | POA: Diagnosis not present

## 2021-06-04 DIAGNOSIS — I2510 Atherosclerotic heart disease of native coronary artery without angina pectoris: Secondary | ICD-10-CM | POA: Diagnosis not present

## 2021-06-04 DIAGNOSIS — E785 Hyperlipidemia, unspecified: Secondary | ICD-10-CM

## 2021-06-04 DIAGNOSIS — I25119 Atherosclerotic heart disease of native coronary artery with unspecified angina pectoris: Secondary | ICD-10-CM | POA: Diagnosis not present

## 2021-06-04 DIAGNOSIS — R251 Tremor, unspecified: Secondary | ICD-10-CM | POA: Diagnosis not present

## 2021-06-04 NOTE — Progress Notes (Signed)
LIPID CLINIC CONSULT NOTE  Chief Complaint:  Follow-up dyslipidemia  Primary Care Physician: [name], [name], MD  Primary Cardiologist:  Sherren Mocha, MD  HPI:  Tracy [name] is a 75 y.o. female who is being seen today for the evaluation of dyslipidemia, elevated liver enzyme at the request of [name], Tracy So, MD.  This is a pleasant 75 year old female patient of Dr. Juliann Pulse, PA-C, who presents for evaluation and management of dyslipidemia.  She has a history of coronary artery disease with prior stents.  She had a recent repeat cardiac catheterization in December 2021, but was found to have no significant stenosis requiring any repeat intervention.  LVEF at the time was 50% with mild pulmonary hypertension noted during right heart catheterization.  She was subsequently seen in follow-up and had been found in December by her PCP to have had elevated liver enzymes.  Her atorvastatin was discontinued because of myalgias, particularly and worsening knee pain.  Tracy [name] says that those symptoms had improved significantly off the statin.  Her liver enzymes at worst were AST 88 and ALT 69.  They have since normalized.  Ultrasound demonstrated hepatic steatosis.  Subsequently she has remained on ezetimibe which she has been on for some time.  She said she recently had GU surgery and subsequently has struggled with constipation.  She also has been using a narcotic pain medicine.  06/04/2021  Tracy [name] returns today for follow-up of her dyslipidemia.  She has had progressive decline in her lipids and a very good response to pravastatin.  Total cholesterol few months ago was 214, triglycerides 183, HDL 41 and LDL 140 and now her lipid profile shows total cholesterol 154, triglycerides 120, HDL 46 and LDL 86.  Overall very good improvement, however she has reportedly developed some tremulousness.  She is noted to have a significant rest tremor in both hands today.  She is also  developed some gait instability, uses a cane and had a fall at a friend's house apparently.  She also had 1 episode of chest pain.  She underwent cardiac catheterization by Tracy [name] December 2021 as above.  She took a nitroglycerin for her symptoms.  I will make Tracy [name] aware of this.  I highly suspect that the statin is not the cause of her tremulousness but she should make an appointment with her primary care provider and may need a neurology referral  PMHx:  Past Medical History:  Diagnosis Date   Allergic rhinitis    Anxiety    Arthritis    Bronchitis    CAD (coronary artery disease)    1/19 PCI/DES to [facility] for ISR, normal EF.    Cancer ([place])    hx of precancerous cells in right breast    Cervical dysplasia    unsure of procedure, possible "burning" in her late 30s   CHF (congestive heart failure) ([place])    Echo 06/2019: EF 55-60, elevated LVEDP, normal RV SF, mild MAC, mild MR, trivial TR   Complication of anesthesia    COPD (chronic obstructive pulmonary disease) (HCC)    early    Dyspnea    with exertion    Family history of adverse reaction to anesthesia    daughter- problems wiht n/v   GERD (gastroesophageal reflux disease)    Gout    Headache(784.0)    Heart murmur    hx of years ago    Hyperlipidemia    Hypertension    Low back pain  Menopausal syndrome    Myocardial infarct [facility]) 2007   hx of   Overactive bladder    PONV (postoperative nausea and vomiting)     Past Surgical History:  Procedure Laterality Date   ANGIOPLASTY     stent 2007   BREAST LUMPECTOMY WITH RADIOACTIVE SEED LOCALIZATION Right 04/28/2020   Procedure: RIGHT BREAST LUMPECTOMY X 2  WITH RADIOACTIVE SEED LOCALIZATION;  Surgeon: Donnie Mesa, MD;  Location: [facility];  Service: General;  Laterality: Right;  LMA   CARDIAC CATHETERIZATION     COLONOSCOPY     CORONARY STENT INTERVENTION N/A 11/06/2017   Procedure: CORONARY STENT INTERVENTION;  Surgeon: [name], MD;   Location: [facility];  Service: Cardiovascular;  Laterality: N/A;   LEFT HEART CATH AND CORONARY ANGIOGRAPHY N/A 11/06/2017   Procedure: LEFT HEART CATH AND CORONARY ANGIOGRAPHY;  Surgeon: [name], MD;  Location: [facility];  Service: Cardiovascular;  Laterality: N/A;   LEFT HEART CATH AND CORONARY ANGIOGRAPHY N/A 07/24/2019   Procedure: LEFT HEART CATH AND CORONARY ANGIOGRAPHY;  Surgeon: Burnell Blanks, MD;  Location: [facility];  Service: Cardiovascular;  Laterality: N/A;   RIGHT/LEFT HEART CATH AND CORONARY ANGIOGRAPHY N/A 09/29/2020   Procedure: RIGHT/LEFT HEART CATH AND CORONARY ANGIOGRAPHY;  Surgeon: Belva Crome, MD;  Location: [facility];  Service: Cardiovascular;  Laterality: N/A;   ROBOTIC ASSISTED BILATERAL SALPINGO OOPHERECTOMY Bilateral 11/10/2020   Procedure: XI ROBOTIC ASSISTED BILATERAL SALPINGO OOPHORECTOMY WITH MINI LAPAROTOMY FOR DRAINAGE;  Surgeon: Lafonda Mosses, MD;  [facility];  Service: Gynecology;  Laterality: Bilateral;  MINI LAP FIRST   TONSILLECTOMY     VULVECTOMY N/A 11/10/2020   Procedure: WIDE EXCISION VULVECTOMY;  Surgeon: Lafonda Mosses, MD;  [facility];  Service: Gynecology;  Laterality: N/A;    FAMHx:  Family History  Adopted: Yes  Problem Relation Age of Onset   Colon cancer Mother 51   Cancer Mother        colon   Depression Mother        Suicide.   Other Other        patient is adopted   Diabetes Daughter        type 2   Stomach cancer Neg Hx     SOCHx:   reports that she quit smoking about 4 years ago. Her smoking use included cigarettes. She has a 65.00 pack-year smoking history. She has never used smokeless tobacco. She reports current alcohol use. She reports that she does not use drugs.  ALLERGIES:  Allergies  Allergen Reactions   Erythromycin Rash    But isn't certain   Sulfamethoxazole Rash    ROS: Pertinent items noted in HPI and remainder of comprehensive ROS  otherwise negative.  HOME MEDS: Current Outpatient Medications on File Prior to Visit  Medication Sig Dispense Refill   acetaminophen (TYLENOL) 500 MG tablet Take 500-1,000 mg by mouth every 6 (six) hours as needed for headache.      albuterol (PROVENTIL) (2.5 MG/3ML) 0.083% nebulizer solution Take 3 mLs (2.5 mg total) by nebulization every 6 (six) hours as needed for wheezing or shortness of breath. 300 mL 5   albuterol (VENTOLIN HFA) 108 (90 Base) MCG/ACT inhaler Inhale 2 puffs into the lungs every 4 (four) hours as needed for wheezing. 1 each 3   allopurinol (ZYLOPRIM) 100 MG tablet TAKE 1 TABLET BY MOUTH EVERY DAY 90 tablet 0   amLODipine (NORVASC) 5 MG tablet  Take 1 tablet (5 mg total) by mouth daily. 90 tablet 3   ANORO ELLIPTA 62.5-25 MCG/INH AEPB INHALE 1 PUFF BY MOUTH EVERY DAY 60 each 12   APPLE CIDER VINEGAR PO Take 2,400 mg by mouth daily.     ASPERCREME LIDOCAINE EX Apply 1 application topically daily as needed (pain).     aspirin EC 81 MG tablet Take 81 mg by mouth daily.     Camphor-Eucalyptus-Menthol (VICKS VAPORUB EX) Apply 1 application topically daily as needed (congestion).     cetirizine (ZYRTEC) 10 MG tablet Take 10 mg by mouth daily.     Cholecalciferol (VITAMIN D3) 50 MCG (2000 UT) TABS Take 4,000 Units by mouth daily.      clopidogrel (PLAVIX) 75 MG tablet TAKE 1 TABLET (75 MG TOTAL) BY MOUTH DAILY WITH BREAKFAST. 90 tablet 3   Cyanocobalamin (VITAMIN B-12) 5000 MCG SUBL Place 5,000 mcg under the tongue daily.      ezetimibe (ZETIA) 10 MG tablet TAKE 1 TABLET BY MOUTH DAILY 90 tablet 3   fluticasone (FLONASE) 50 MCG/ACT nasal spray SPRAY 2 SPRAYS INTO EACH NOSTRIL EVERY DAY (Patient taking differently: Place 2 sprays into both nostrils daily.) 48 mL 1   furosemide (LASIX) 40 MG tablet Take 40 mg by mouth daily as needed for fluid or edema.     guaiFENesin (MUCINEX) 600 MG 12 hr tablet Take 600 mg by mouth 2 (two) times daily as needed (congestion).     isosorbide  mononitrate (IMDUR) 30 MG 24 hr tablet Take 1 tablet (30 mg total) by mouth daily. 90 tablet 3   losartan (COZAAR) 100 MG tablet TAKE 1 TABLET BY MOUTH EVERY DAY 90 tablet 3   meclizine (ANTIVERT) 25 MG tablet Take 1/2-1 tablet by mouth twice a day as needed. 30 tablet 0   metoprolol succinate (TOPROL-XL) 100 MG 24 hr tablet TAKE 1 TABLET BY MOUTH DAILY. TAKE WITH OR IMMEDIATELY FOLLOWING A MEAL. 90 tablet 2   Misc Natural Products (TART CHERRY ADVANCED) CAPS Take 1,000 capsules by mouth daily.      oxymetazoline (AFRIN) 0.05 % nasal spray Place 1 spray into both nostrils 2 (two) times daily as needed for congestion.     pantoprazole (PROTONIX) 40 MG tablet TAKE 1 TABLET BY MOUTH EVERY DAY 90 tablet 2   potassium chloride SA (KLOR-CON) 20 MEQ tablet Take 20 mEq by mouth daily.     pravastatin (PRAVACHOL) 40 MG tablet Take 1 tablet (40 mg total) by mouth every evening. 90 tablet 3   Simethicone (GAS-X PO) Take 2 tablets by mouth daily as needed (gas).     [DISCONTINUED] nitroGLYCERIN (NITROSTAT) 0.4 MG SL tablet Place 1 tablet (0.4 mg total) under the tongue every 5 (five) minutes as needed for chest pain. 25 tablet 3   No current facility-administered medications on file prior to visit.    LABS/IMAGING: Results for orders placed or performed in visit on 05/05/21 (from the past 48 hour(s))  Lipid panel     Status: None   Collection Time: 06/03/21  8:47 AM  Result Value Ref Range   Cholesterol, Total 154 100 - 199 mg/dL   Triglycerides 120 0 - 149 mg/dL   HDL 46 >39 mg/dL   VLDL Cholesterol Cal 22 5 - 40 mg/dL   LDL Chol Calc (NIH) 86 0 - 99 mg/dL   Chol/HDL Ratio 3.3 0.0 - 4.4 ratio    Comment:  T. Chol/HDL Ratio                                             Men  Women                               1/2 Avg.Risk  3.4    3.3                                   Avg.Risk  5.0    4.4                                2X Avg.Risk  9.6    7.1                                 3X Avg.Risk 23.4   11.0    No results found.  LIPID PANEL:    Component Value Date/Time   CHOL 154 06/03/2021 0847   TRIG 120 06/03/2021 0847   HDL 46 06/03/2021 0847   CHOLHDL 3.3 06/03/2021 0847   CHOLHDL 5.0 (H) 09/13/2016 0738   VLDL 35 (H) 09/13/2016 0738   LDLCALC 86 06/03/2021 0847   LDLDIRECT 111.0 01/05/2015 1019    WEIGHTS: Wt Readings from Last 3 Encounters:  06/04/21 191 lb 3.2 oz (86.7 kg)  05/13/21 189 lb 12.8 oz (86.1 kg)  04/20/21 201 lb 1 oz (91.2 kg)    VITALS: BP 138/74   [name] 100   Ht _0  (1.651 m)   Wt 191 lb 3.2 oz (86.7 kg)   SpO2 96%   BMI 31.82 kg/m   EXAM: Deferred  EKG: Deferred  ASSESSMENT: CAD with prior PCI Mixed dyslipidemia, goal LDL less than 70 Atorvastatin intolerance-myalgias, elevated liver enzymes Hepatic steatosis  PLAN: 1.   Ms. Schram has had significant improvement in her lipids on pravastatin in combination with ezetimibe.  Her LDL cholesterol is now 86.  This is a very good improvement although she remains above her LDL target of 70, additional weight loss could help her get there.  She has been experiencing worsening tremors which I doubt is related to the statin however I advise she can take a 2-week statin holiday to see if they go away.  If they do not I would advise restarting the pravastatin and she should reach out either way to her primary care provider as she may need a neurology referral.  She did have 1 episode of chest pain for which she took nitro.  She had a cath to evaluate chest pain in December which did not require intervention.  She is followed by Dr. Gerrit Halls as her primary cardiologist.  I will forward this to him as well.  Plan follow-up with me for lipids in 1 year or sooner as necessary.  [name], MD, [facility], [place] Director of the Advanced Lipid Disorders &  Cardiovascular Risk Reduction Clinic Diplomate of the American Board of Clinical  Lipidology Attending Cardiologist  Direct Dial: [phone]  Fax: [phone]  Website:  [email or web address] 06/04/2021, 8:46 AM

## 2021-06-04 NOTE — Patient Instructions (Signed)
Medication Instructions:  Stop pravastatin for 2 weeks to see if symptoms improve. If no improvement, please restart medication.   *If you need a refill on your cardiac medications before your next appointment, please call your pharmacy*   Lab Work: None ordered today   Testing/Procedures: None ordered today   Follow-Up: At [facility], you and your health needs are our priority.  As part of our continuing mission to provide you with exceptional heart care, we have created designated Provider Care Teams.  These Care Teams include your primary Cardiologist (physician) and Advanced Practice Providers (APPs -  Physician Assistants and Nurse Practitioners) who all work together to provide you with the care you need, when you need it.  We recommend signing up for the patient portal called "MyChart".  Sign up information is provided on this After Visit Summary.  MyChart is used to connect with patients for Virtual Visits (Telemedicine).  Patients are able to view lab/test results, encounter notes, upcoming appointments, etc.  Non-urgent messages can be sent to your provider as well.   To learn more about what you can do with MyChart, go to [email or web address].    Your next appointment:   1 year(s)  The format for your next appointment:   In Person  Provider:   K. Mali Hilty, MD

## 2021-06-14 ENCOUNTER — Other Ambulatory Visit: Payer: Self-pay

## 2021-06-15 ENCOUNTER — Encounter: Payer: Self-pay | Admitting: Family Medicine

## 2021-06-15 ENCOUNTER — Ambulatory Visit (INDEPENDENT_AMBULATORY_CARE_PROVIDER_SITE_OTHER): Payer: Medicare Other | Admitting: Family Medicine

## 2021-06-15 VITALS — BP 130/80 | HR 95 | Resp 16 | Ht 65.0 in | Wt 188.4 lb

## 2021-06-15 DIAGNOSIS — R251 Tremor, unspecified: Secondary | ICD-10-CM | POA: Diagnosis not present

## 2021-06-15 DIAGNOSIS — R1084 Generalized abdominal pain: Secondary | ICD-10-CM

## 2021-06-15 DIAGNOSIS — I25119 Atherosclerotic heart disease of native coronary artery with unspecified angina pectoris: Secondary | ICD-10-CM | POA: Diagnosis not present

## 2021-06-15 DIAGNOSIS — I700 Atherosclerosis of aorta: Secondary | ICD-10-CM | POA: Diagnosis not present

## 2021-06-15 DIAGNOSIS — K5900 Constipation, unspecified: Secondary | ICD-10-CM | POA: Diagnosis not present

## 2021-06-15 DIAGNOSIS — R2681 Unsteadiness on feet: Secondary | ICD-10-CM | POA: Diagnosis not present

## 2021-06-15 NOTE — Patient Instructions (Addendum)
A few things to remember from today's visit:  Constipation, unspecified constipation type  Generalized abdominal pain  Tremor of both hands - Plan: Ambulatory referral to Neurology, Vitamin B12  Unstable gait - Plan: Ambulatory referral to Physical Therapy, Vitamin B12  Fall precautions. Appt with neurologist and PT will be arranged. Monitor for new symptoms. Take Miralax at bedtime daily as needed.  If you need refills please call your pharmacy. Do not use My Chart to request refills or for acute issues that need immediate attention.    Please be sure medication list is accurate. If a new problem present, please set up appointment sooner than planned today.

## 2021-06-15 NOTE — Progress Notes (Signed)
Chief Complaint  Patient presents with   Shaking    Ongoing since last visit, starting to get worse   HPI: [name] is a 75 y.o. female with hx of OA,GERD,CAD,CKD III,HTN,and HFrEF here today complaining of hands tremor as described above. She was last seen on 04/20/21 for hospital f/u after fall, we briefly discussed hand tremor at this time. Problem started a few weeks before last visit, initially right hand and now bilateral. It is intermittent and getting worse. She does not think it is worse with certain activities.  She stopped statin,thinking it was causing problem, but she did not noted any difference. No associated CP,palpitations,worsening SOB,or changes in bowel habits.  COPD: She is on Albuterol inh and Anoro but she is not currently using any of her inhalers.  Unstable gait is also getting worse. No falls since her last visit. She uses a cane to help with transfer. Hx of knee OA.  Negative for dysphagia or smelling changes.  No known hx of essential tremor or parkinson in her family, she is adopted.  Lab Results  Component Value Date   TSH 1.71 04/20/2021   Lab Results  Component Value Date   CREATININE 1.12 (H) 04/18/2021   BUN 14 04/18/2021   NA 138 04/18/2021   K 3.9 04/18/2021   CL 107 04/18/2021   CO2 22 04/18/2021   Still having abdominal pain, which started after hospital d/c and surgery.  Right-sided mainly. Occasionally nausea. No vomiting or urinary symptoms.  Stable and no new associated symptom. Not sure about exacerbating factors.  BM 1-2 week,does not recall last one. She has not noted melena or blood in stool.  She does not take OTC med. Improves with defecation. She wonders if pain is caused by "adhesions." Bilateral salpingo-oophorectomy with minilaparotomy for drainage and wide excision vulvectomy on 11/10/20.  Abdominal and pelvic CT on 11/24/20 was performed because abdominal pain: 1. No acute intra-abdominal or  pelvic pathology. 2. Interval resection of the previously seen pelvic mass. No fluid collection or abscess. 3. Severe sigmoid diverticulosis. No bowel obstruction. 4. Aortic Atherosclerosis (ICD10-I70.0).  Lab Results  Component Value Date   WBC 11.5 (H) 04/18/2021   HGB 13.8 04/18/2021   HCT 43.2 04/18/2021   MCV 90.2 04/18/2021   PLT 244 04/18/2021   Review of Systems  Constitutional:  Positive for fatigue. Negative for activity change, appetite change and fever.  HENT:  Negative for mouth sores and nosebleeds.   Respiratory:  Negative for cough and wheezing.   Endocrine: Negative for cold intolerance and heat intolerance.  Genitourinary:  Negative for decreased urine volume, dysuria and hematuria.  Musculoskeletal:  Positive for arthralgias and gait problem.  Skin:  Negative for pallor and rash.  Neurological:  Positive for dizziness. Negative for syncope, weakness and headaches.  Psychiatric/Behavioral:  Negative for confusion. The patient is nervous/anxious.   Rest see pertinent positives and negatives per HPI.  Current Outpatient Medications on File Prior to Visit  Medication Sig Dispense Refill   acetaminophen (TYLENOL) 500 MG tablet Take 500-1,000 mg by mouth every 6 (six) hours as needed for headache.      albuterol (PROVENTIL) (2.5 MG/3ML) 0.083% nebulizer solution Take 3 mLs (2.5 mg total) by nebulization every 6 (six) hours as needed for wheezing or shortness of breath. 300 mL 5   albuterol (VENTOLIN HFA) 108 (90 Base) MCG/ACT inhaler Inhale 2 puffs into the lungs every 4 (four) hours as needed for wheezing. 1 each 3  allopurinol (ZYLOPRIM) 100 MG tablet TAKE 1 TABLET BY MOUTH EVERY DAY 90 tablet 0   amLODipine (NORVASC) 5 MG tablet Take 1 tablet (5 mg total) by mouth daily. 90 tablet 3   ANORO ELLIPTA 62.5-25 MCG/INH AEPB INHALE 1 PUFF BY MOUTH EVERY DAY 60 each 12   APPLE CIDER VINEGAR PO Take 2,400 mg by mouth daily.     ASPERCREME LIDOCAINE EX Apply 1 application  topically daily as needed (pain).     aspirin EC 81 MG tablet Take 81 mg by mouth daily.     Camphor-Eucalyptus-Menthol (VICKS VAPORUB EX) Apply 1 application topically daily as needed (congestion).     cetirizine (ZYRTEC) 10 MG tablet Take 10 mg by mouth daily.     Cholecalciferol (VITAMIN D3) 50 MCG (2000 UT) TABS Take 4,000 Units by mouth daily.      clopidogrel (PLAVIX) 75 MG tablet TAKE 1 TABLET (75 MG TOTAL) BY MOUTH DAILY WITH BREAKFAST. 90 tablet 3   Cyanocobalamin (VITAMIN B-12) 5000 MCG SUBL Place 5,000 mcg under the tongue daily.      ezetimibe (ZETIA) 10 MG tablet TAKE 1 TABLET BY MOUTH DAILY 90 tablet 3   fluticasone (FLONASE) 50 MCG/ACT nasal spray SPRAY 2 SPRAYS INTO EACH NOSTRIL EVERY DAY (Patient taking differently: Place 2 sprays into both nostrils daily.) 48 mL 1   furosemide (LASIX) 40 MG tablet Take 40 mg by mouth daily as needed for fluid or edema.     guaiFENesin (MUCINEX) 600 MG 12 hr tablet Take 600 mg by mouth 2 (two) times daily as needed (congestion).     isosorbide mononitrate (IMDUR) 30 MG 24 hr tablet Take 1 tablet (30 mg total) by mouth daily. 90 tablet 3   losartan (COZAAR) 100 MG tablet TAKE 1 TABLET BY MOUTH EVERY DAY 90 tablet 3   meclizine (ANTIVERT) 25 MG tablet Take 1/2-1 tablet by mouth twice a day as needed. 30 tablet 0   metoprolol succinate (TOPROL-XL) 100 MG 24 hr tablet TAKE 1 TABLET BY MOUTH DAILY. TAKE WITH OR IMMEDIATELY FOLLOWING A MEAL. 90 tablet 2   Misc Natural Products (TART CHERRY ADVANCED) CAPS Take 1,000 capsules by mouth daily.      oxymetazoline (AFRIN) 0.05 % nasal spray Place 1 spray into both nostrils 2 (two) times daily as needed for congestion.     pantoprazole (PROTONIX) 40 MG tablet TAKE 1 TABLET BY MOUTH EVERY DAY 90 tablet 2   potassium chloride SA (KLOR-CON) 20 MEQ tablet Take 20 mEq by mouth daily.     Simethicone (GAS-X PO) Take 2 tablets by mouth daily as needed (gas).     pravastatin (PRAVACHOL) 40 MG tablet Take 1 tablet (40  mg total) by mouth every evening. 90 tablet 3   [DISCONTINUED] nitroGLYCERIN (NITROSTAT) 0.4 MG SL tablet Place 1 tablet (0.4 mg total) under the tongue every 5 (five) minutes as needed for chest pain. 25 tablet 3   No current facility-administered medications on file prior to visit.   Past Medical History:  Diagnosis Date   Allergic rhinitis    Anxiety    Arthritis    Bronchitis    CAD (coronary artery disease)    1/19 PCI/DES to [place] for ISR, normal EF.    Cancer ([facility])    hx of precancerous cells in right breast    Cervical dysplasia    unsure of procedure, possible "burning" in her late 40s   CHF (congestive heart failure) ([place])    Echo 06/2019: EF  55-60, elevated LVEDP, normal RV SF, mild MAC, mild MR, trivial TR   Complication of anesthesia    COPD (chronic obstructive pulmonary disease) ([facility])    early    Dyspnea    with exertion    Family history of adverse reaction to anesthesia    daughter- problems wiht n/v   GERD (gastroesophageal reflux disease)    Gout    Headache(784.0)    Heart murmur    hx of years ago    Hyperlipidemia    Hypertension    Low back pain    Menopausal syndrome    Myocardial infarct ([place]) 2007   hx of   Overactive bladder    PONV (postoperative nausea and vomiting)    Allergies  Allergen Reactions   Erythromycin Rash    But isn't certain   Sulfamethoxazole Rash    Social History   Socioeconomic History   Marital status: Married    Spouse name: Not on file   Number of children: 2   Years of education: Not on file   Highest education level: Not on file  Occupational History   Occupation: retired  Tobacco Use   Smoking status: Former    Packs/day: 1.25    Years: 52.00    Pack years: 65.00    Types: Cigarettes    Quit date: 2018    Years since quitting: 4.6   Smokeless tobacco: Never   Tobacco comments:    completely quit May of 2018; period of years she did not smoke   Vaping Use   Vaping Use: Never used  Substance and  Sexual Activity   Alcohol use: Yes    Comment: seldom   Drug use: No   Sexual activity: Not Currently  Other Topics Concern   Not on file  Social History Narrative   Not on file   Social Determinants of Health   Financial Resource Strain: Not on file  Food Insecurity: Not on file  Transportation Needs: Not on file  Physical Activity: Not on file  Stress: Not on file  Social Connections: Not on file   Vitals:   06/15/21 1109  BP: 130/80  Pulse: 95  Resp: 16  SpO2: 97%   Body mass index is 31.35 kg/m.  Physical Exam Vitals and nursing note reviewed.  Constitutional:      General: She is not in acute distress.    Appearance: She is well-developed.  HENT:     Head: Normocephalic and atraumatic.     Mouth/Throat:     Mouth: Mucous membranes are moist.     Pharynx: Oropharynx is clear.  Eyes:     Conjunctiva/sclera: Conjunctivae normal.  Cardiovascular:     Rate and Rhythm: Normal rate and regular rhythm.     Pulses:          Dorsalis pedis pulses are 2+ on the right side and 2+ on the left side.     Heart sounds: No murmur heard. Pulmonary:     Effort: Pulmonary effort is normal. No respiratory distress.     Breath sounds: Normal breath sounds.  Abdominal:     Palpations: Abdomen is soft. There is no hepatomegaly or mass.     Tenderness: There is no abdominal tenderness.  Lymphadenopathy:     Cervical: No cervical adenopathy.  Skin:    General: Skin is warm.     Findings: No erythema or rash.  Neurological:     General: No focal deficit present.  Mental Status: She is alert and oriented to person, place, and time.     Cranial Nerves: No cranial nerve deficit.     Motor: Tremor (Worse with activity.) present. No weakness or pronator drift.     Coordination: Romberg sign negative.     Gait: Gait abnormal.     Comments: Unstable gait assisted with a cane. Pronation/supination hand movement and nose-finger tapping slow. Tremor of right thumb present at  rest.  Psychiatric:     Comments: Well groomed, good eye contact.   ASSESSMENT AND PLAN:  [name] was seen today for shaking.  Diagnoses and all orders for this visit: Orders Placed This Encounter  Procedures   Vitamin B12   Ambulatory referral to Neurology   Ambulatory referral to Physical Therapy   Constipation, unspecified constipation type This problem is chronic and could be contributing to her abdominal pain. Recommend Miralax daily at night as needed. Adequate hydration and fiber intake.  Generalized abdominal pain Problem has been going on since 10/2020, some present before surgery,stable. Explained that adhesions (which she is concerned about) can certainly cause chronic abdominal pain but I do not think it is causing hers at this time.We discussed physiopathology. I do not think further imaging is needed at this time. Instructed about warning signs.  Tremor of both hands It seems to be getting worse. We discussed possible etiologies. Parkinson is to be considered. We discussed treatment options, she is already on BB for HTN and CAD. Neuro referral placed.  Unstable gait Fall precautions discussed. PT will be arranged.  Aortic atherosclerosis [facility]) Following with cardiologist for CAD. LDL is not at goal, 97 (06/03/21). She is on Aspirin 81 mg and Plavix 75 mg daily as well as Pravastatin.  I spent a total of 42 minutes in both face to face and non face to face activities for this visit on the date of this encounter. During this time history was obtained and documented, examination was performed, prior labs/imaging reviewed, and assessment/plan discussed.  Return in about 2 months (around 08/15/2021), or if symptoms worsen or fail to improve, for Keep next appt.  Mcarthur Ivins G. [name], MD  [facility]. [place] office.

## 2021-06-22 ENCOUNTER — Other Ambulatory Visit (INDEPENDENT_AMBULATORY_CARE_PROVIDER_SITE_OTHER): Payer: Medicare Other

## 2021-06-22 ENCOUNTER — Other Ambulatory Visit: Payer: Self-pay

## 2021-06-22 DIAGNOSIS — R2681 Unsteadiness on feet: Secondary | ICD-10-CM

## 2021-06-22 DIAGNOSIS — R251 Tremor, unspecified: Secondary | ICD-10-CM | POA: Diagnosis not present

## 2021-06-22 LAB — VITAMIN B12: Vitamin B-12: 1520 pg/mL — ABNORMAL HIGH (ref 211–911)

## 2021-06-24 ENCOUNTER — Encounter: Payer: Self-pay | Admitting: Neurology

## 2021-06-30 NOTE — Progress Notes (Signed)
Assessment/Plan:   Tremor Symptoms most consistent with benign essential tremor, although tremor technically needs to be present for 3 years in order to make that diagnosis. No evidence of Parkinson's disease.  Reassurance provided. Discussed symptomatic medications.  Ultimately, patient did not want any.  She can let us know if she changes her mind. Vertigo Seems to be occurring with head turning to the left.  Carotid ultrasound was negative.  We will proceed with CTA brain.  She does report that meclizine is helping, so may just be a peripheral BPPV. If above is negative, she will follow-up with [place] on an as-needed basis.  Subjective:   CHEENA HOFMANN was seen today in the movement disorders clinic for neurologic consultation at the request of [name], Malka So, MD.  The consultation is for the evaluation of tremor.  She is with her best friend who supplements the history.      Tremor: Yes.   , started around July, 2022 - gradually came on  At rest or with activation?  activation  When is it noted the most?  Unknown - seems to come and go  Fam hx of tremor?  Unknown - pt is adopted  Located where?  L>R; she is L hand dominant  Affected by caffeine:  No. (drinks 1 large cup coffee/day)  Affected by alcohol:  doesn't drink any  Affected by stress:  Yes.    Affected by fatigue:  No.  Spills soup if on spoon:  may or may not  Affects ADL's (tying shoes, brushing teeth, etc):  No.  Tremor inducing meds:  Yes.  Albuterol (rarely uses the medication - hasn't used it since Jan)  Tremor improving meds: metoprolol  Other Specific Symptoms:  Voice: no change Sleep: trouble getting and staying asleep  Vivid Dreams:  No.  Acting out dreams:  No. Postural symptoms:  Yes.    Falls?  Yes.  , fell in July.  Was outside at friends house and turned head to the L and just fell down.  Hit head and sunglasses cut nose.  Went to ER.  One other fall in the last 6 months. Bradykinesia symptoms:  difficulty getting out of a chair (attributes to arthritis in knees); occ shuffle when walk but not always Loss of smell:  No. Loss of taste:  No. Urinary Incontinence:  Yes.  , stress Difficulty Swallowing:  No. Handwriting, micrographia: some Trouble with ADL's:  No.  Trouble buttoning clothing: No. N/V:  No. Lightheaded:  Yes.  , 2 episodes when turned head left and this led to carotid u/s and it was neg.  Was then told she had vertigo and was put on meclizine.   Diplopia:  No.  CT brain done on 04/18/21 and was nonacute    ALLERGIES:   Allergies  Allergen Reactions   Erythromycin Rash    But isn't certain   Sulfamethoxazole Rash    CURRENT MEDICATIONS:  Current Outpatient Medications  Medication Instructions   acetaminophen (TYLENOL) 500-1,000 mg, Oral, Every 6 hours PRN   albuterol (PROVENTIL) 2.5 mg, Nebulization, Every 6 hours PRN   albuterol (VENTOLIN HFA) 108 (90 Base) MCG/ACT inhaler 2 puffs, Inhalation, Every 4 hours PRN   allopurinol (ZYLOPRIM) 100 MG tablet TAKE 1 TABLET BY MOUTH EVERY DAY   amLODipine (NORVASC) 5 mg, Oral, Daily   ANORO ELLIPTA 62.5-25 MCG/INH AEPB INHALE 1 PUFF BY MOUTH EVERY DAY   APPLE CIDER VINEGAR PO 2,400 mg, Oral, Daily   ASPERCREME LIDOCAINE EX  1 application, Apply externally, Daily PRN   aspirin EC 81 mg, Oral, Daily   Camphor-Eucalyptus-Menthol (VICKS VAPORUB EX) 1 application, Apply externally, Daily PRN   cetirizine (ZYRTEC) 10 mg, Oral, Daily   clopidogrel (PLAVIX) 75 mg, Oral, Daily with breakfast   ezetimibe (ZETIA) 10 MG tablet TAKE 1 TABLET BY MOUTH DAILY   fluticasone (FLONASE) 50 MCG/ACT nasal spray SPRAY 2 SPRAYS INTO EACH NOSTRIL EVERY DAY   furosemide (LASIX) 40 mg, Oral, Daily PRN   guaiFENesin (MUCINEX) 600 mg, Oral, 2 times daily PRN   isosorbide mononitrate (IMDUR) 30 mg, Oral, Daily   losartan (COZAAR) 100 MG tablet TAKE 1 TABLET BY MOUTH EVERY DAY   metoprolol succinate (TOPROL-XL) 100 MG 24 hr tablet TAKE 1 TABLET  BY MOUTH DAILY. TAKE WITH OR IMMEDIATELY FOLLOWING A MEAL.   Misc Natural Products (TART CHERRY ADVANCED) CAPS 1,000 capsules, Oral, Daily   oxymetazoline (AFRIN) 0.05 % nasal spray 1 spray, Each Nare, 2 times daily PRN   pantoprazole (PROTONIX) 40 MG tablet TAKE 1 TABLET BY MOUTH EVERY DAY   potassium chloride SA (KLOR-CON) 20 MEQ tablet 20 mEq, Oral, Daily   pravastatin (PRAVACHOL) 40 mg, Oral, Every evening   Simethicone (GAS-X PO) 2 tablets, Oral, Daily PRN   Vitamin B-12 5,000 mcg, Sublingual, Daily   Vitamin D3 4,000 Units, Oral, Daily    Objective:   PHYSICAL EXAMINATION:    VITALS:   Vitals:   07/06/21 0821  BP: 135/62  Pulse: 92  SpO2: 97%  Weight: 182 lb 9.6 oz (82.8 kg)  Height: '5\' 4"'$  (1.626 m)    GEN:  The patient appears stated age and is in NAD. HEENT:  Normocephalic, atraumatic.  The mucous membranes are moist. The superficial temporal arteries are without ropiness or tenderness. CV:  RRR Lungs:  CTAB Neck/HEME:  There are no carotid bruits bilaterally.  Neurological examination:  Orientation: The patient is alert and oriented x3.  Cranial nerves: There is good facial symmetry.  Extraocular muscles are intact. The visual fields are full to confrontational testing. The speech is fluent and clear. Soft palate rises symmetrically and there is no tongue deviation. Hearing is intact to conversational tone. Sensation: Sensation is intact to light touch throughout (facial, trunk, extremities). Vibration is intact at the bilateral big toe. There is no extinction with double simultaneous stimulation.  Motor: Strength is 5/5 in the bilateral upper and lower extremities.   Shoulder shrug is equal and symmetric.  There is no pronator drift. Deep tendon reflexes: Deep tendon reflexes are 2-/4 at the bilateral biceps, triceps, brachioradialis, patella and achilles. Plantar responses are downgoing bilaterally.  Movement examination: Tone: There is nl tone in the bilateral  upper extremities.  The tone in the lower extremities is nl.  Abnormal movements: there is no rest tremor.  There is postural tremor.  There is intention tremor bilaterally, mild.  she has difficulty when asked to pour a full glass of water from one glass to another.  Tremor is worse when both full glass is in the left hand. Coordination:  There is no decremation with RAM's, with any form of RAMS, including alternating supination and pronation of the forearm, hand opening and closing, finger taps, heel taps and toe taps. Gait and Station: The patient has no difficulty arising out of a deep-seated chair without the use of the hands. The patient's stride length is good with her cane.   I have reviewed and interpreted the following labs independently   Chemistry  Component Value Date/Time   NA 138 04/18/2021 1250   NA 139 02/03/2021 0829   K 3.9 04/18/2021 1250   CL 107 04/18/2021 1250   CO2 22 04/18/2021 1250   BUN 14 04/18/2021 1250   BUN 25 02/03/2021 0829   CREATININE 1.12 (H) 04/18/2021 1250   CREATININE 1.05 (H) 09/13/2016 0738      Component Value Date/Time   CALCIUM 9.3 04/18/2021 1250   ALKPHOS 89 03/16/2021 0828   AST 24 03/16/2021 0828   ALT 22 03/16/2021 0828   BILITOT 0.4 03/16/2021 0828      Lab Results  Component Value Date   TSH 1.71 04/20/2021   Lab Results  Component Value Date   WBC 11.5 (H) 04/18/2021   HGB 13.8 04/18/2021   HCT 43.2 04/18/2021   MCV 90.2 04/18/2021   PLT 244 04/18/2021      Total time spent on today's visit was 45 minutes, including both face-to-face time and nonface-to-face time.  Time included that spent on review of records (prior notes available to me/labs/imaging if pertinent), discussing treatment and goals, answering patient's questions and coordinating care.  Cc:  [name], [name], MD

## 2021-07-06 ENCOUNTER — Other Ambulatory Visit: Payer: Self-pay

## 2021-07-06 ENCOUNTER — Ambulatory Visit: Payer: No Typology Code available for payment source | Admitting: Neurology

## 2021-07-06 ENCOUNTER — Encounter: Payer: Self-pay | Admitting: Neurology

## 2021-07-06 ENCOUNTER — Ambulatory Visit (INDEPENDENT_AMBULATORY_CARE_PROVIDER_SITE_OTHER): Payer: Medicare Other | Admitting: Neurology

## 2021-07-06 VITALS — BP 135/62 | HR 92 | Ht 64.0 in | Wt 182.6 lb

## 2021-07-06 DIAGNOSIS — I25119 Atherosclerotic heart disease of native coronary artery with unspecified angina pectoris: Secondary | ICD-10-CM

## 2021-07-06 DIAGNOSIS — H814 Vertigo of central origin: Secondary | ICD-10-CM | POA: Diagnosis not present

## 2021-07-06 DIAGNOSIS — R251 Tremor, unspecified: Secondary | ICD-10-CM

## 2021-07-06 NOTE — Patient Instructions (Signed)
A referral to [facility] has been placed for your CTA someone will contact you directly to schedule your appt. They are located at [place]. Please contact them directly by calling [phone] with any questions regarding your referral.

## 2021-07-10 ENCOUNTER — Other Ambulatory Visit: Payer: Self-pay | Admitting: Family Medicine

## 2021-07-10 DIAGNOSIS — M109 Gout, unspecified: Secondary | ICD-10-CM

## 2021-07-12 ENCOUNTER — Ambulatory Visit: Payer: No Typology Code available for payment source | Admitting: Neurology

## 2021-07-14 ENCOUNTER — Encounter: Payer: Self-pay | Admitting: Physical Therapy

## 2021-07-14 ENCOUNTER — Ambulatory Visit: Payer: Medicare Other | Attending: Family Medicine | Admitting: Physical Therapy

## 2021-07-14 ENCOUNTER — Other Ambulatory Visit: Payer: Self-pay

## 2021-07-14 DIAGNOSIS — M6281 Muscle weakness (generalized): Secondary | ICD-10-CM | POA: Insufficient documentation

## 2021-07-14 DIAGNOSIS — R2681 Unsteadiness on feet: Secondary | ICD-10-CM | POA: Insufficient documentation

## 2021-07-14 DIAGNOSIS — R296 Repeated falls: Secondary | ICD-10-CM | POA: Diagnosis not present

## 2021-07-14 NOTE — Therapy (Signed)
[facility] [address], [place], [place], [address] Phone: [phone]   Fax:  [phone]  Physical Therapy Evaluation  Patient Details  Name: Tracy [name] MRN: [id number] Date of Birth: January 09, 1946 Referring Provider (PT): [name], MD   Encounter Date: 07/14/2021   PT End of Session - 07/14/21 2003     Visit Number 1    Date for PT Re-Evaluation 09/13/21    Authorization Type Medicare    Authorization Time Period 07/14/21 to 09/13/21    Progress Note Due on Visit 10    PT Start Time 3291    PT Stop Time 1228    PT Time Calculation (min) 43 min    Activity Tolerance Patient tolerated treatment well    Behavior During Therapy [facility] for tasks assessed/performed             Past Medical History:  Diagnosis Date   Allergic rhinitis    Anxiety    Arthritis    Bronchitis    CAD (coronary artery disease)    1/19 PCI/DES to [place] for ISR, normal EF.    Cancer ([place])    hx of precancerous cells in right breast    Cervical dysplasia    unsure of procedure, possible "burning" in her late 60s   CHF (congestive heart failure) ([place])    Echo 06/2019: EF 55-60, elevated LVEDP, normal RV SF, mild MAC, mild MR, trivial TR   Complication of anesthesia    COPD (chronic obstructive pulmonary disease) (HCC)    early    Dyspnea    with exertion    Family history of adverse reaction to anesthesia    daughter- problems wiht n/v   GERD (gastroesophageal reflux disease)    Gout    Headache(784.0)    Heart murmur    hx of years ago    Hyperlipidemia    Hypertension    Low back pain    Menopausal syndrome    Myocardial infarct ([place]) 2007   hx of   Overactive bladder    PONV (postoperative nausea and vomiting)     Past Surgical History:  Procedure Laterality Date   ANGIOPLASTY     stent 2007   BREAST LUMPECTOMY WITH RADIOACTIVE SEED LOCALIZATION Right 04/28/2020   Procedure: RIGHT BREAST LUMPECTOMY X 2   WITH RADIOACTIVE SEED LOCALIZATION;  Surgeon: Donnie Mesa, MD;  Location: [place];  Service: General;  Laterality: Right;  LMA   CARDIAC CATHETERIZATION     COLONOSCOPY     CORONARY STENT INTERVENTION N/A 11/06/2017   Procedure: CORONARY STENT INTERVENTION;  Surgeon: [name], MD;  Location: [place] [facility];  Service: Cardiovascular;  Laterality: N/A;   LEFT HEART CATH AND CORONARY ANGIOGRAPHY N/A 11/06/2017   Procedure: LEFT HEART CATH AND CORONARY ANGIOGRAPHY;  Surgeon: [name], MD;  Location: [place] [facility];  Service: Cardiovascular;  Laterality: N/A;   LEFT HEART CATH AND CORONARY ANGIOGRAPHY N/A 07/24/2019   Procedure: LEFT HEART CATH AND CORONARY ANGIOGRAPHY;  Surgeon: Burnell Blanks, MD;  Location: [place] [facility];  Service: Cardiovascular;  Laterality: N/A;   RIGHT/LEFT HEART CATH AND CORONARY ANGIOGRAPHY N/A 09/29/2020   Procedure: RIGHT/LEFT HEART CATH AND CORONARY ANGIOGRAPHY;  Surgeon: Belva Crome, MD;  Location: [place] [facility];  Service: Cardiovascular;  Laterality: N/A;   ROBOTIC ASSISTED BILATERAL SALPINGO OOPHERECTOMY Bilateral 11/10/2020   Procedure: XI ROBOTIC ASSISTED BILATERAL SALPINGO OOPHORECTOMY WITH MINI LAPAROTOMY FOR DRAINAGE;  Surgeon: Lafonda Mosses, MD;  [facility];  Service: Gynecology;  Laterality: Bilateral;  MINI LAP FIRST   TONSILLECTOMY     VULVECTOMY N/A 11/10/2020   Procedure: WIDE EXCISION VULVECTOMY;  Surgeon: Lafonda Mosses, MD;  [facility]: [facility];  Service: Gynecology;  Laterality: N/A;    There were no vitals filed for this visit.    Subjective Assessment - 07/14/21 1152     Subjective Pt states that she has had worsening balance. She has probably fallen 3 times in the past 6 months. She fell in July and hit her head, and she states that she just turned her head and over she went. She notes occasional light headedness and that her primary issue is unsteadiness on her feet. She  had her overies and fallopian tubes removed back in January and hasn't felt right since.    Pertinent History CAD, arthritis    Limitations Walking    Patient Stated Goals be able to walk without using her cane    Currently in Pain? No/denies                [facility] PT Assessment - 07/14/21 0001       Assessment   Medical Diagnosis unstable gait    Referring Provider (PT) [name], MD    Onset Date/Surgical Date --   January 2022   Prior Therapy none      Precautions   Precautions None      Balance Screen   Has the patient fallen in the past 6 months Yes    How many times? 3    Has the patient had a decrease in activity level because of a fear of falling?  Yes    Is the patient reluctant to leave their home because of a fear of falling?  No      Home Environment   Living Environment Private residence    Additional Comments avoids using the stairs at her house, only uses the 7-8 steps going out of the house      Prior Function   Level of Independence Independent      Cognition   Overall Cognitive Status Within Functional Limits for tasks assessed      ROM / Strength   AROM / PROM / Strength Strength      Strength   Strength Assessment Site Hip;Ankle    Right/Left Hip Right;Left    Right Hip Flexion 3/5    Right Hip ABduction 2+/5    Left Hip Flexion 3/5    Left Hip ABduction 3/5    Right/Left Ankle Right;Left    Right Ankle Dorsiflexion 5/5    Right Ankle Plantar Flexion --   x5 single leg heel raises BLE   Left Ankle Dorsiflexion 5/5      Transfers   Five time sit to stand comments  25 sec, UE on thighs      Standardized Balance Assessment   Standardized Balance Assessment Timed Up and Go Test;Berg Balance Test      Berg Balance Test   Sit to Stand Able to stand  independently using hands    Standing Unsupported Able to stand safely 2 minutes    Sitting with Back Unsupported but Feet Supported on Floor or Stool Able to sit safely and securely 2  minutes    Stand to Sit Sits safely with minimal use of hands    Transfers Able to transfer safely, minor use of hands    Standing Unsupported  with Eyes Closed Able to stand 10 seconds safely    Standing Unsupported with Feet Together Needs help to attain position but able to stand for 30 seconds with feet together    From Standing, Reach Forward with Outstretched Arm Can reach forward >12 cm safely (5")    From Standing Position, Pick up Object from [employer] to pick up shoe safely and easily    From Standing Position, Turn to Look Behind Over each Shoulder Turn sideways only but maintains balance    Turn 360 Degrees Able to turn 360 degrees safely but slowly    Standing Unsupported, Alternately Place Feet on Step/Stool Able to stand independently and complete 8 steps >20 seconds    Standing Unsupported, One Foot in Front Needs help to step but can hold 15 seconds    Standing on One Leg Tries to lift leg/unable to hold 3 seconds but remains standing independently    Total Score 40      Timed Up and Go Test   TUG Comments 17 sec using SPC                        Objective measurements completed on examination: See above findings.       [place] Adult PT Treatment/Exercise - 07/14/21 0001       Self-Care   Self-Care Other Self-Care Comments    Other Self-Care Comments  proper height of SPC and importance of using it at correct height      Exercises   Exercises Ankle      Ankle Exercises: Standing   Heel Raises 5 reps;Both    Heel Raises Limitations HEP demo                     PT Education - 07/14/21 2003     Education Details eval findings/POC; adjusted SPC to appropriate height; implemented HEP    Person(s) Educated Patient    Methods Explanation    Comprehension Verbalized understanding              PT Short Term Goals - 07/14/21 1233       PT SHORT TERM GOAL #1   Title Pt will be independent with her initial HEP to improve LE strength  and balance.    Time 4    Period Weeks    Status New      PT SHORT TERM GOAL #2   Title Pt will consistently use her SPC at the new adjusted height to improve her mechanics and safety with ambulation.    Time 4    Period Weeks    Status New               PT Long Term Goals - 07/14/21 1234       PT LONG TERM GOAL #1   Title Pt will complete the 5x sit to stand in less than 14 sec without the need for UE support to reflect improvements in LE strength.    Time 8    Period Weeks    Status New    Target Date 09/13/21      PT LONG TERM GOAL #2   Title Pt will complete TUG in less than 14 sec with LRAD to reflect improvements in her balance.    Time 8    Period Weeks    Status New      PT LONG TERM GOAL #3   Title Pt will score  atleast 6 point increase in her BERG score to reflect a significant improvement in her balance and decreas risk of falling.    Time 8    Period Weeks    Status New      PT LONG TERM GOAL #4   Title Pt will have atleast 4/5 MMT strength in hip abductors to help with single leg stability.    Time 8    Period Weeks    Status New                    Plan - 07/14/21 2004     Clinical Impression Statement Pt is a pleasant 75 y.o F referred to OPPT with concerns over worsening balance and 3 falls since January of this year. She currently ambulates with a SPC. Pt demonstrates functional weakness with her 5x sit to stand and her MMT of the LEs is 3+/5 for hip flexion and 2/5 hip abduction. Pt required an AD to complete the TUG and her goal is to be able to ambulate safely without her cane. Pt's BERG score is 40/56 placing her at a significant risk of falling. PT educated the pt on proper SPC height and made necessary adjustments during today's evaluation. Pt is hesitant to look to the Lt with active Lt cervical rotation as this was reportedly the cause of a fall back in July. This may require further evaluation. Pt would greatly benefit from skilled  PT to address her strength limitations and improve her proprioception/balance in order to decrease her need for an AD and decrease her risk of future falls and injury.    Personal Factors and Comorbidities Past/Current Experience;Fitness;Age    Examination-Activity Limitations Locomotion Level    Stability/Clinical Decision Making Stable/Uncomplicated    Clinical Decision Making Low    Rehab Potential Good    PT Frequency 2x / week    PT Duration 8 weeks    PT Treatment/Interventions ADLs/Self Care Home Management;Gait training;Stair training;Functional mobility training;Therapeutic activities;Therapeutic exercise;Neuromuscular re-education;Balance training;Patient/family education    PT Next Visit Plan add to HEP (pt going out of town and will need something to work on until her next appt); hip strengthening; static balance progression    PT Home Exercise Plan standing heel raises-no code    Consulted and Agree with Plan of Care Patient             Patient will benefit from skilled therapeutic intervention in order to improve the following deficits and impairments:  Decreased balance, Decreased endurance, Difficulty walking, Decreased coordination, Decreased safety awareness, Decreased strength  Visit Diagnosis: Unsteadiness on feet  Repeated falls  Muscle weakness (generalized)     Problem List Patient Active Problem List   Diagnosis Date Noted   Aortic atherosclerosis ([place]) 06/15/2021   Generalized osteoarthritis of multiple sites 12/25/2020   Chronic pain disorder 12/25/2020   Pelvic mass in female    Bilateral lower extremity edema 04/13/2020   Shortness of breath    Anxiety disorder 10/29/2018   CKD (chronic kidney disease), stage III ([place]) 06/27/2018   COPD (chronic obstructive pulmonary disease) ([place]) 05/25/2018   Obesity 03/20/2018   Dyspnea on exertion 11/06/2017   Abnormal stress test 11/06/2017   Chronic diastolic CHF (congestive heart failure) ([place])  10/03/2017   Chronic combined systolic and diastolic heart failure ([place]) 09/04/2017   Left ventricular dysfunction 07/31/2017   Cataract, nuclear sclerotic senile, bilateral 02/01/2017   Gouty arthritis of toe of left foot 08/09/2016   Hyperuricemia 08/09/2016  Osteoarthritis (arthritis due to wear and tear of joints) 01/14/2014   Coronary artery disease 08/02/2011   Mitral regurgitation 08/02/2011   Hx of tobacco abuse 08/02/2011   OVERACTIVE BLADDER 07/02/2010   ACUTE CYSTITIS 05/25/2010   Pure hypercholesterolemia 08/11/2009   PEDAL EDEMA 07/22/2009   GERD 04/11/2008   Essential hypertension 05/30/2007   MYOCARDIAL INFARCTION, HX OF 05/30/2007   Allergic rhinitis 05/30/2007   LOW BACK PAIN 05/30/2007   8:17 PM,07/14/21 Sherol Dade PT, DPT [place] [facility] [address] [place], [place], [address] Phone: [phone]   Fax:  [phone]  Name: CYANNA NEACE MRN: [id number] Date of Birth: March 20, 1946

## 2021-07-19 DIAGNOSIS — Z23 Encounter for immunization: Secondary | ICD-10-CM | POA: Diagnosis not present

## 2021-07-21 ENCOUNTER — Other Ambulatory Visit: Payer: Self-pay

## 2021-07-21 ENCOUNTER — Encounter: Payer: Self-pay | Admitting: Physical Therapy

## 2021-07-21 ENCOUNTER — Ambulatory Visit: Payer: Medicare Other | Attending: Family Medicine | Admitting: Physical Therapy

## 2021-07-21 DIAGNOSIS — R296 Repeated falls: Secondary | ICD-10-CM | POA: Diagnosis not present

## 2021-07-21 DIAGNOSIS — M6281 Muscle weakness (generalized): Secondary | ICD-10-CM | POA: Insufficient documentation

## 2021-07-21 DIAGNOSIS — R2681 Unsteadiness on feet: Secondary | ICD-10-CM | POA: Diagnosis not present

## 2021-07-21 NOTE — Therapy (Signed)
[facility] [place], [place], [address] Phone:     Fax:     Physical Therapy Treatment  Patient Details  Name: Tracy [name] MRN: [id number] Date of Birth: Feb 10, 1946 Referring Provider (PT): [name], MD   Encounter Date: 07/21/2021   PT End of Session - 07/21/21 1221     Visit Number 2    Date for PT Re-Evaluation 09/13/21    Authorization Type Medicare    Authorization Time Period 07/14/21 to 09/13/21    Progress Note Due on Visit 10    PT Start Time 1696    PT Stop Time 1217    PT Time Calculation (min) 32 min    Activity Tolerance Patient tolerated treatment well    Behavior During Therapy [facility] for tasks assessed/performed             Past Medical History:  Diagnosis Date   Allergic rhinitis    Anxiety    Arthritis    Bronchitis    CAD (coronary artery disease)    1/19 PCI/DES to [place] for ISR, normal EF.    Cancer ([place])    hx of precancerous cells in right breast    Cervical dysplasia    unsure of procedure, possible "burning" in her late 57s   CHF (congestive heart failure) ([place])    Echo 06/2019: EF 55-60, elevated LVEDP, normal RV SF, mild MAC, mild MR, trivial TR   Complication of anesthesia    COPD (chronic obstructive pulmonary disease) (HCC)    early    Dyspnea    with exertion    Family history of adverse reaction to anesthesia    daughter- problems wiht n/v   GERD (gastroesophageal reflux disease)    Gout    Headache(784.0)    Heart murmur    hx of years ago    Hyperlipidemia    Hypertension    Low back pain    Menopausal syndrome    Myocardial infarct ([place]) 2007   hx of   Overactive bladder    PONV (postoperative nausea and vomiting)     Past Surgical History:  Procedure Laterality Date   ANGIOPLASTY     stent 2007   BREAST LUMPECTOMY WITH RADIOACTIVE SEED LOCALIZATION Right 04/28/2020   Procedure: RIGHT BREAST LUMPECTOMY X 2  WITH RADIOACTIVE SEED  LOCALIZATION;  Surgeon: Donnie Mesa, MD;  Location: [place];  Service: General;  Laterality: Right;  LMA   CARDIAC CATHETERIZATION     COLONOSCOPY     CORONARY STENT INTERVENTION N/A 11/06/2017   Procedure: CORONARY STENT INTERVENTION;  Surgeon: [name], MD;  Location: [place] [facility];  Service: Cardiovascular;  Laterality: N/A;   LEFT HEART CATH AND CORONARY ANGIOGRAPHY N/A 11/06/2017   Procedure: LEFT HEART CATH AND CORONARY ANGIOGRAPHY;  Surgeon: [name], MD;  Location: [place] [facility];  Service: Cardiovascular;  Laterality: N/A;   LEFT HEART CATH AND CORONARY ANGIOGRAPHY N/A 07/24/2019   Procedure: LEFT HEART CATH AND CORONARY ANGIOGRAPHY;  Surgeon: Burnell Blanks, MD;  Location: [place] [facility];  Service: Cardiovascular;  Laterality: N/A;   RIGHT/LEFT HEART CATH AND CORONARY ANGIOGRAPHY N/A 09/29/2020   Procedure: RIGHT/LEFT HEART CATH AND CORONARY ANGIOGRAPHY;  Surgeon: Belva Crome, MD;  Location: [place] [facility];  Service: Cardiovascular;  Laterality: N/A;   ROBOTIC ASSISTED BILATERAL SALPINGO OOPHERECTOMY Bilateral 11/10/2020   Procedure: XI ROBOTIC ASSISTED BILATERAL SALPINGO OOPHORECTOMY WITH MINI  LAPAROTOMY FOR DRAINAGE;  Surgeon: Lafonda Mosses, MD;  [facility];  Service: Gynecology;  Laterality: Bilateral;  MINI LAP FIRST   TONSILLECTOMY     VULVECTOMY N/A 11/10/2020   Procedure: WIDE EXCISION VULVECTOMY;  Surgeon: Lafonda Mosses, MD;  [facility];  Service: Gynecology;  Laterality: N/A;    There were no vitals filed for this visit.   Subjective Assessment - 07/21/21 1144     Subjective I am traveling to cat sit next week so [name] want some HEP to work on while I'm there.  I [name] be there by myself so I'm a little weary about that.    Pertinent History CAD, arthritis    Limitations Walking    Patient Stated Goals be able to walk without using her cane    Currently in Pain? No/denies                                [facility] Adult PT Treatment/Exercise - 07/21/21 0001       Exercises   Exercises Shoulder;Knee/Hip;Lumbar      Lumbar Exercises: Aerobic   Nustep L2 x 3' seat 9 arms 10, PT present to discuss status      Lumbar Exercises: Standing   Heel Raises 10 reps    Heel Raises Limitations UE support      Knee/Hip Exercises: Standing   Heel Raises Both;10 reps    Hip Abduction AROM;Left;Right;5 reps;Knee straight    Abduction Limitations UE support    Hip Extension AROM;Both;5 reps;Knee straight    Extension Limitations UE support      Knee/Hip Exercises: Seated   Long Arc Quad AROM;Both;2 sets;5 reps    Marching Both;20 reps;1 set    Sit to Sand 5 reps;with UE support   chair + black foam pad, hands on thighs up and down                    PT Education - 07/21/21 1207     Education Details Access Code: [email or web address]    Person(s) Educated Patient    Methods Explanation;Demonstration;Handout    Comprehension Verbalized understanding;Returned demonstration              PT Short Term Goals - 07/14/21 1233       PT SHORT TERM GOAL #1   Title Pt [name] be independent with her initial HEP to improve LE strength and balance.    Time 4    Period Weeks    Status New      PT SHORT TERM GOAL #2   Title Pt [name] consistently use her SPC at the new adjusted height to improve her mechanics and safety with ambulation.    Time 4    Period Weeks    Status New               PT Long Term Goals - 07/14/21 1234       PT LONG TERM GOAL #1   Title Pt [name] complete the 5x sit to stand in less than 14 sec without the need for UE support to reflect improvements in LE strength.    Time 8    Period Weeks    Status New    Target Date 09/13/21      PT LONG TERM GOAL #2   Title Pt [name] complete TUG in less than 14 sec with LRAD to reflect improvements in her balance.  Time 8    Period Weeks    Status New      PT LONG TERM GOAL #3   Title  Pt [name] score atleast 6 point increase in her BERG score to reflect a significant improvement in her balance and decreas risk of falling.    Time 8    Period Weeks    Status New      PT LONG TERM GOAL #4   Title Pt [name] have atleast 4/5 MMT strength in hip abductors to help with single leg stability.    Time 8    Period Weeks    Status New                   Plan - 07/21/21 1221     Clinical Impression Statement Pt continues to be weary of head turns secondary to dizziness and falls.  She is awaiting a CT for brain given this symptom and development of tremors.  PT and Pt discussed avoiding incorporation of head turns until this is further worked up.  Pt [name] be traveling to cat sit her daughter's cats next week and wished for a HEP she could do safely while there.  She [name] be alone there and has a history of falls.  PT developed seated and standing with UE counter support HEP with good tolerance and safety when performed in clinic today.  PT discussed how to progress to 2nd set should Pt feel ready.  Pt with good awareness of glut med and quad with weight shifting today.  She ambulated slowly but safely using SPC within clinic today.  PT discussed assessing new environment in daughter's home being aware of fall risks such as throw rugs etc.  Pt [name] return in 2 weeks after her trip.    PT Next Visit Plan review HEP and progress for functional strength and balance, work toward functional gait challenges, narrow BOS, SLS with UE support as ready    PT Home Exercise Plan Access Code [email or web address]    Consulted and Agree with Plan of Care Patient             Patient [name] benefit from skilled therapeutic intervention in order to improve the following deficits and impairments:     Visit Diagnosis: Unsteadiness on feet  Repeated falls  Muscle weakness (generalized)     Problem List Patient Active Problem List   Diagnosis Date Noted   Aortic atherosclerosis ([place]) 06/15/2021    Generalized osteoarthritis of multiple sites 12/25/2020   Chronic pain disorder 12/25/2020   Pelvic mass in female    Bilateral lower extremity edema 04/13/2020   Shortness of breath    Anxiety disorder 10/29/2018   CKD (chronic kidney disease), stage III ([place]) 06/27/2018   COPD (chronic obstructive pulmonary disease) ([place]) 05/25/2018   Obesity 03/20/2018   Dyspnea on exertion 11/06/2017   Abnormal stress test 11/06/2017   Chronic diastolic CHF (congestive heart failure) ([place]) 10/03/2017   Chronic combined systolic and diastolic heart failure ([place]) 09/04/2017   Left ventricular dysfunction 07/31/2017   Cataract, nuclear sclerotic senile, bilateral 02/01/2017   Gouty arthritis of toe of left foot 08/09/2016   Hyperuricemia 08/09/2016   Osteoarthritis (arthritis due to wear and tear of joints) 01/14/2014   Coronary artery disease 08/02/2011   Mitral regurgitation 08/02/2011   Hx of tobacco abuse 08/02/2011   OVERACTIVE BLADDER 07/02/2010   ACUTE CYSTITIS 05/25/2010   Pure hypercholesterolemia 08/11/2009   PEDAL EDEMA 07/22/2009   GERD 04/11/2008  Essential hypertension 05/30/2007   MYOCARDIAL INFARCTION, HX OF 05/30/2007   Allergic rhinitis 05/30/2007   LOW BACK PAIN 05/30/2007    Alene Mires Jerianne Anselmo, PT 07/21/2021, 12:27 PM  [facility] @ [place], [place], [address] Phone:     Fax:     Name: Tracy [name] MRN: [id number] Date of Birth: 16-Mar-1946

## 2021-07-21 NOTE — Patient Instructions (Addendum)
Access Code: [email or web address] URL: [email or web address] Date: 07/21/2021 Prepared by: Venetia Night Delecia Vastine  Exercises Heel Raises with Counter Support - 1 x daily - 7 x weekly - 2 sets - 10 reps Standing Hip Abduction with Counter Support - 1 x daily - 7 x weekly - 2 sets - 5 reps Standing Hip Extension with Counter Support - 1 x daily - 7 x weekly - 2 sets - 5 reps Sit to Stand with Hands on Knees - 1 x daily - 7 x weekly - 2 sets - 5 reps Seated Single Leg March with Weight Shift - 1 x daily - 7 x weekly - 1 sets - 20 reps Seated Long Arc Quad - 1 x daily - 7 x weekly - 2 sets - 5 reps Side to Side Weight Shift with Counter Support - 1 x daily - 7 x weekly - 1 sets - 10 reps Staggered Stance Forward Backward Weight Shift with Counter Support - 1 x daily - 7 x weekly - 1 sets - 10 reps

## 2021-07-23 ENCOUNTER — Other Ambulatory Visit: Payer: Self-pay | Admitting: Physician Assistant

## 2021-07-29 ENCOUNTER — Other Ambulatory Visit: Payer: No Typology Code available for payment source

## 2021-08-02 ENCOUNTER — Other Ambulatory Visit: Payer: Self-pay

## 2021-08-02 ENCOUNTER — Telehealth: Payer: Self-pay | Admitting: Internal Medicine

## 2021-08-02 ENCOUNTER — Ambulatory Visit: Payer: Medicare Other | Admitting: Rehabilitative and Restorative Service Providers"

## 2021-08-02 ENCOUNTER — Encounter: Payer: Self-pay | Admitting: Rehabilitative and Restorative Service Providers"

## 2021-08-02 DIAGNOSIS — R296 Repeated falls: Secondary | ICD-10-CM | POA: Diagnosis not present

## 2021-08-02 DIAGNOSIS — R2681 Unsteadiness on feet: Secondary | ICD-10-CM

## 2021-08-02 DIAGNOSIS — M6281 Muscle weakness (generalized): Secondary | ICD-10-CM

## 2021-08-02 NOTE — Therapy (Signed)
[facility] @ [place], [place], [address] Phone: [phone]   Fax:  [phone]  Physical Therapy Treatment  Patient Details  Name: Tracy [name] MRN: [id number] Date of Birth: 1946/05/31 Referring Provider (PT): [name], MD   Encounter Date: 08/02/2021   PT End of Session - 08/02/21 0945     Visit Number 3    Date for PT Re-Evaluation 09/13/21    Authorization Type Medicare    Authorization Time Period 07/14/21 to 09/13/21    Progress Note Due on Visit 10    PT Start Time 0930    PT Stop Time 1010    PT Time Calculation (min) 40 min    Activity Tolerance Patient tolerated treatment well    Behavior During Therapy [facility] for tasks assessed/performed             Past Medical History:  Diagnosis Date   Allergic rhinitis    Anxiety    Arthritis    Bronchitis    CAD (coronary artery disease)    1/19 PCI/DES to [facility] for ISR, normal EF.    Cancer ([place])    hx of precancerous cells in right breast    Cervical dysplasia    unsure of procedure, possible "burning" in her late 72s   CHF (congestive heart failure) ([place])    Echo 06/2019: EF 55-60, elevated LVEDP, normal RV SF, mild MAC, mild MR, trivial TR   Complication of anesthesia    COPD (chronic obstructive pulmonary disease) (HCC)    early    Dyspnea    with exertion    Family history of adverse reaction to anesthesia    daughter- problems wiht n/v   GERD (gastroesophageal reflux disease)    Gout    Headache(784.0)    Heart murmur    hx of years ago    Hyperlipidemia    Hypertension    Low back pain    Menopausal syndrome    Myocardial infarct ([place]) 2007   hx of   Overactive bladder    PONV (postoperative nausea and vomiting)     Past Surgical History:  Procedure Laterality Date   ANGIOPLASTY     stent 2007   BREAST LUMPECTOMY WITH RADIOACTIVE SEED LOCALIZATION Right 04/28/2020   Procedure: RIGHT BREAST LUMPECTOMY X 2   WITH RADIOACTIVE SEED LOCALIZATION;  Surgeon: Donnie Mesa, MD;  Location: [facility];  Service: General;  Laterality: Right;  LMA   CARDIAC CATHETERIZATION     COLONOSCOPY     CORONARY STENT INTERVENTION N/A 11/06/2017   Procedure: CORONARY STENT INTERVENTION;  Surgeon: [name], MD;  Location: [facility];  Service: Cardiovascular;  Laterality: N/A;   LEFT HEART CATH AND CORONARY ANGIOGRAPHY N/A 11/06/2017   Procedure: LEFT HEART CATH AND CORONARY ANGIOGRAPHY;  Surgeon: [name], MD;  Location: [facility];  Service: Cardiovascular;  Laterality: N/A;   LEFT HEART CATH AND CORONARY ANGIOGRAPHY N/A 07/24/2019   Procedure: LEFT HEART CATH AND CORONARY ANGIOGRAPHY;  Surgeon: Burnell Blanks, MD;  Location: [facility];  Service: Cardiovascular;  Laterality: N/A;   RIGHT/LEFT HEART CATH AND CORONARY ANGIOGRAPHY N/A 09/29/2020   Procedure: RIGHT/LEFT HEART CATH AND CORONARY ANGIOGRAPHY;  Surgeon: Belva Crome, MD;  Location: [facility];  Service: Cardiovascular;  Laterality: N/A;   ROBOTIC ASSISTED BILATERAL SALPINGO OOPHERECTOMY Bilateral 11/10/2020   Procedure: XI ROBOTIC ASSISTED BILATERAL SALPINGO OOPHORECTOMY WITH MINI LAPAROTOMY FOR  DRAINAGE;  Surgeon: Lafonda Mosses, MD;  [facility];  Service: Gynecology;  Laterality: Bilateral;  MINI LAP FIRST   TONSILLECTOMY     VULVECTOMY N/A 11/10/2020   Procedure: WIDE EXCISION VULVECTOMY;  Surgeon: Lafonda Mosses, MD;  Location: [facility];  Service: Gynecology;  Laterality: N/A;    There were no vitals filed for this visit.   Subjective Assessment - 08/02/21 0944     Subjective Pt reports that cat sitting was okay, she did the exercises when she could do it, but was very cautious since she was there alone. Reports that she has been having some increased pain.    Patient Stated Goals be able to walk without using her cane    Currently in Pain? Yes    Pain Score 5      Pain Location Hip    Pain Orientation Left    Pain Descriptors / Indicators [place]                               [facility] Adult PT Treatment/Exercise - 08/02/21 0001       Transfers   Five time sit to stand comments  27.1 sec with UE use      Ambulation/Gait   Gait Comments Amb with SPC down hallway performing horizontal and head turns x2 laps each.      Standardized Balance Assessment   Standardized Balance Assessment Timed Up and Go Test      Timed Up and Go Test   TUG Normal TUG    Normal TUG (seconds) 18.3   with SPC     Lumbar Exercises: Aerobic   Nustep L2 x 5' seat 9 arms 10, PT present to discuss status      Knee/Hip Exercises: Standing   Heel Raises Both;2 sets;10 reps    Hip Abduction Stengthening;Both;1 set;10 reps    Abduction Limitations UE support    Hip Extension Stengthening;Both;1 set;10 reps;Knee straight    Extension Limitations UE support    Other Standing Knee Exercises Alt LE toe taps on 6" step with UE support x10 each      Knee/Hip Exercises: Seated   Long Arc Quad Strengthening;Both;2 sets;10 reps    Marching Both;2 sets;10 reps                       PT Short Term Goals - 08/02/21 1014       PT SHORT TERM GOAL #1   Title Pt will be independent with her initial HEP to improve LE strength and balance.    Status Achieved      PT SHORT TERM GOAL #2   Title Pt will consistently use her SPC at the new adjusted height to improve her mechanics and safety with ambulation.    Status Achieved               PT Long Term Goals - 08/02/21 1015       PT LONG TERM GOAL #1   Title Pt will complete the 5x sit to stand in less than 14 sec without the need for UE support to reflect improvements in LE strength.    Status On-going      PT LONG TERM GOAL #2   Title Pt will complete TUG in less than 14 sec with LRAD to reflect improvements in her balance.    Status On-going      PT LONG  TERM GOAL #3   Title Pt will  score atleast 6 point increase in her BERG score to reflect a significant improvement in her balance and decreas risk of falling.    Status On-going      PT LONG TERM GOAL #4   Title Pt will have atleast 4/5 MMT strength in hip abductors to help with single leg stability.    Status On-going                   Plan - 08/02/21 1012     Clinical Impression Statement Ms Riddell continues to have weariness with head turns and states that she tries to avoid them. She has a pending CT scan to further assess. Pt with difficulty with ambulation down hallway with SPC and performing head turns with multiple pauses throughout. Pt reports some fatigue with ther ex, but is progressing towards increased activity tolerance and able to perform increased reps today. Pt requires cuing with hip abduction to kick laterally instead of diagonally.    PT Treatment/Interventions ADLs/Self Care Home Management;Gait training;Stair training;Functional mobility training;Therapeutic activities;Therapeutic exercise;Neuromuscular re-education;Balance training;Patient/family education    PT Next Visit Plan review HEP and progress for functional strength and balance, work toward functional gait challenges, narrow BOS, SLS with UE support as ready    Consulted and Agree with Plan of Care Patient             Patient will benefit from skilled therapeutic intervention in order to improve the following deficits and impairments:  Decreased balance, Decreased endurance, Difficulty walking, Decreased coordination, Decreased safety awareness, Decreased strength  Visit Diagnosis: Repeated falls  Unsteadiness on feet  Muscle weakness (generalized)     Problem List Patient Active Problem List   Diagnosis Date Noted   Aortic atherosclerosis ([place]) 06/15/2021   Generalized osteoarthritis of multiple sites 12/25/2020   Chronic pain disorder 12/25/2020   Pelvic mass in female    Bilateral lower extremity edema  04/13/2020   Shortness of breath    Anxiety disorder 10/29/2018   CKD (chronic kidney disease), stage III ([place]) 06/27/2018   COPD (chronic obstructive pulmonary disease) ([place]) 05/25/2018   Obesity 03/20/2018   Dyspnea on exertion 11/06/2017   Abnormal stress test 11/06/2017   Chronic diastolic CHF (congestive heart failure) ([place]) 10/03/2017   Chronic combined systolic and diastolic heart failure ([place]) 09/04/2017   Left ventricular dysfunction 07/31/2017   Cataract, nuclear sclerotic senile, bilateral 02/01/2017   Gouty arthritis of toe of left foot 08/09/2016   Hyperuricemia 08/09/2016   Osteoarthritis (arthritis due to wear and tear of joints) 01/14/2014   Coronary artery disease 08/02/2011   Mitral regurgitation 08/02/2011   Hx of tobacco abuse 08/02/2011   OVERACTIVE BLADDER 07/02/2010   ACUTE CYSTITIS 05/25/2010   Pure hypercholesterolemia 08/11/2009   PEDAL EDEMA 07/22/2009   GERD 04/11/2008   Essential hypertension 05/30/2007   MYOCARDIAL INFARCTION, HX OF 05/30/2007   Allergic rhinitis 05/30/2007   LOW BACK PAIN 05/30/2007    Juel Burrow, PT, DPT 08/02/2021, 10:15 AM  [facility] @ [place], [place], [address] Phone: [phone]   Fax:  [phone]  Name: JULIENE KIRSH MRN: [id number] Date of Birth: October 19, 1945

## 2021-08-02 NOTE — Telephone Encounter (Signed)
Good morning Dr. Henrene Pastor,   Patient called in to cancel procedure for 10/20 due to unable to have a caregiver stay with her. She rescheduled for 12/5.  Thank you

## 2021-08-03 ENCOUNTER — Ambulatory Visit
Admission: RE | Admit: 2021-08-03 | Discharge: 2021-08-03 | Disposition: A | Discharge status: Home / Self Care | Payer: Medicare Other | Source: Ambulatory Visit | Attending: Neurology | Admitting: Neurology

## 2021-08-03 DIAGNOSIS — H814 Vertigo of central origin: Secondary | ICD-10-CM

## 2021-08-03 DIAGNOSIS — R42 Dizziness and giddiness: Secondary | ICD-10-CM | POA: Diagnosis not present

## 2021-08-03 DIAGNOSIS — I672 Cerebral atherosclerosis: Secondary | ICD-10-CM | POA: Diagnosis not present

## 2021-08-03 DIAGNOSIS — I6523 Occlusion and stenosis of bilateral carotid arteries: Secondary | ICD-10-CM | POA: Diagnosis not present

## 2021-08-03 DIAGNOSIS — I771 Stricture of artery: Secondary | ICD-10-CM | POA: Diagnosis not present

## 2021-08-03 MED ORDER — IOPAMIDOL (ISOVUE-370) INJECTION 76%
75.0000 mL | Freq: Once | INTRAVENOUS | Status: AC | PRN
  Administered 2021-08-03: 75 mL via INTRAVENOUS

## 2021-08-05 ENCOUNTER — Telehealth: Payer: Self-pay | Admitting: Family Medicine

## 2021-08-05 ENCOUNTER — Encounter: Payer: No Typology Code available for payment source | Admitting: Internal Medicine

## 2021-08-05 NOTE — Chronic Care Management (AMB) (Signed)
  Chronic Care Management   Outreach Note  08/05/2021 Name: Tracy [name] MRN: [id number] DOB: 12/15/1945  Referred by: [name], [name], MD Reason for referral : No chief complaint on file.   An unsuccessful telephone outreach was attempted today. The patient was referred to the pharmacist for assistance with care management and care coordination.   Follow Up Plan:  Tatjana Dellinger Upstream Scheduler

## 2021-08-09 ENCOUNTER — Ambulatory Visit: Payer: Medicare Other | Admitting: Rehabilitative and Restorative Service Providers"

## 2021-08-09 ENCOUNTER — Other Ambulatory Visit: Payer: Self-pay

## 2021-08-09 ENCOUNTER — Encounter: Payer: Self-pay | Admitting: Rehabilitative and Restorative Service Providers"

## 2021-08-09 DIAGNOSIS — M6281 Muscle weakness (generalized): Secondary | ICD-10-CM

## 2021-08-09 DIAGNOSIS — R2681 Unsteadiness on feet: Secondary | ICD-10-CM

## 2021-08-09 DIAGNOSIS — R296 Repeated falls: Secondary | ICD-10-CM

## 2021-08-09 NOTE — Therapy (Signed)
[facility] @ [place], [place], [address] Phone: [phone]   Fax:  [phone]  Physical Therapy Treatment  Patient Details  Name: Tracy [name] MRN: [id number] Date of Birth: 02-Dec-1945 Referring Provider (PT): [name], MD   Encounter Date: 08/09/2021   PT End of Session - 08/09/21 0940     Visit Number 4    Date for PT Re-Evaluation 09/13/21    Authorization Type Medicare    Authorization Time Period 07/14/21 to 09/13/21    PT Start Time 0930    PT Stop Time 1010    PT Time Calculation (min) 40 min    Activity Tolerance Patient tolerated treatment well    Behavior During Therapy [facility] for tasks assessed/performed             Past Medical History:  Diagnosis Date   Allergic rhinitis    Anxiety    Arthritis    Bronchitis    CAD (coronary artery disease)    1/19 PCI/DES to [facility] for ISR, normal EF.    Cancer ([place])    hx of precancerous cells in right breast    Cervical dysplasia    unsure of procedure, possible "burning" in her late 69s   CHF (congestive heart failure) ([place])    Echo 06/2019: EF 55-60, elevated LVEDP, normal RV SF, mild MAC, mild MR, trivial TR   Complication of anesthesia    COPD (chronic obstructive pulmonary disease) (HCC)    early    Dyspnea    with exertion    Family history of adverse reaction to anesthesia    daughter- problems wiht n/v   GERD (gastroesophageal reflux disease)    Gout    Headache(784.0)    Heart murmur    hx of years ago    Hyperlipidemia    Hypertension    Low back pain    Menopausal syndrome    Myocardial infarct ([place]) 2007   hx of   Overactive bladder    PONV (postoperative nausea and vomiting)     Past Surgical History:  Procedure Laterality Date   ANGIOPLASTY     stent 2007   BREAST LUMPECTOMY WITH RADIOACTIVE SEED LOCALIZATION Right 04/28/2020   Procedure: RIGHT BREAST LUMPECTOMY X 2  WITH RADIOACTIVE SEED LOCALIZATION;   Surgeon: Donnie Mesa, MD;  Location: [facility];  Service: General;  Laterality: Right;  LMA   CARDIAC CATHETERIZATION     COLONOSCOPY     CORONARY STENT INTERVENTION N/A 11/06/2017   Procedure: CORONARY STENT INTERVENTION;  Surgeon: [name], MD;  Location: [facility];  Service: Cardiovascular;  Laterality: N/A;   LEFT HEART CATH AND CORONARY ANGIOGRAPHY N/A 11/06/2017   Procedure: LEFT HEART CATH AND CORONARY ANGIOGRAPHY;  Surgeon: [name], MD;  Location: [facility];  Service: Cardiovascular;  Laterality: N/A;   LEFT HEART CATH AND CORONARY ANGIOGRAPHY N/A 07/24/2019   Procedure: LEFT HEART CATH AND CORONARY ANGIOGRAPHY;  Surgeon: Burnell Blanks, MD;  Location: [facility];  Service: Cardiovascular;  Laterality: N/A;   RIGHT/LEFT HEART CATH AND CORONARY ANGIOGRAPHY N/A 09/29/2020   Procedure: RIGHT/LEFT HEART CATH AND CORONARY ANGIOGRAPHY;  Surgeon: Belva Crome, MD;  Location: [facility];  Service: Cardiovascular;  Laterality: N/A;   ROBOTIC ASSISTED BILATERAL SALPINGO OOPHERECTOMY Bilateral 11/10/2020   Procedure: XI ROBOTIC ASSISTED BILATERAL SALPINGO OOPHORECTOMY WITH MINI LAPAROTOMY FOR DRAINAGE;  Surgeon: Lafonda Mosses, MD;  Location:  [facility];  Service: Gynecology;  Laterality: Bilateral;  MINI LAP FIRST   TONSILLECTOMY     VULVECTOMY N/A 11/10/2020   Procedure: WIDE EXCISION VULVECTOMY;  Surgeon: Lafonda Mosses, MD;  [facility];  Service: Gynecology;  Laterality: N/A;    There were no vitals filed for this visit.   Subjective Assessment - 08/09/21 0936     Subjective With the cold weather last week, I could hardly walk.    Patient Stated Goals be able to walk without using her cane    Currently in Pain? Yes    Pain Score 8     Pain Location Knee    Pain Orientation Left    Pain Descriptors / Indicators Patsi Sears Adult PT  Treatment/Exercise - 08/09/21 0001       High Level Balance   High Level Balance Comments Standing on blue pad with ball toss      Lumbar Exercises: Aerobic   Nustep L2 x5' with PT present to discuss pt status      Knee/Hip Exercises: Standing   Heel Raises Both;2 sets;10 reps    Knee Flexion Strengthening;Both;1 set;10 reps    Knee Flexion Limitations HS curls    Hip Flexion Stengthening;Both;1 set;10 reps    Hip Abduction Stengthening;Both;1 set;10 reps    Abduction Limitations UE support    Hip Extension Stengthening;Both;1 set;10 reps;Knee straight    Extension Limitations UE support    Other Standing Knee Exercises Alt LE toe taps on 4" step with UE support 2x10 each      Knee/Hip Exercises: Seated   Long Arc Quad Strengthening;Both;2 sets;10 reps    Long Arc Quad Weight 2 lbs.    Clamshell with TheraBand Red   2x10   Marching Both;2 sets;10 reps    Marching Limitations 2#    Sit to Sand 5 reps;with UE support   sitting on blue foam on mat with UE use                      PT Short Term Goals - 08/02/21 1014       PT SHORT TERM GOAL #1   Title Pt will be independent with her initial HEP to improve LE strength and balance.    Status Achieved      PT SHORT TERM GOAL #2   Title Pt will consistently use her SPC at the new adjusted height to improve her mechanics and safety with ambulation.    Status Achieved               PT Long Term Goals - 08/02/21 1015       PT LONG TERM GOAL #1   Title Pt will complete the 5x sit to stand in less than 14 sec without the need for UE support to reflect improvements in LE strength.    Status On-going      PT LONG TERM GOAL #2   Title Pt will complete TUG in less than 14 sec with LRAD to reflect improvements in her balance.    Status On-going      PT LONG TERM GOAL #3   Title Pt will score atleast 6 point increase in her BERG score to reflect a significant improvement in her balance and decreas risk of  falling.  Status On-going      PT LONG TERM GOAL #4   Title Pt will have atleast 4/5 MMT strength in hip abductors to help with single leg stability.    Status On-going                   Plan - 08/09/21 1012     Clinical Impression Statement Ms Fair reported some stiffness at beginning of session, but states after getting into session and exercising that she is feeling better and more mobile. Pt continues to progress with strengthening and was able to add weight to some seated exercises during session today. Pt continues to require brief seated ther ex during session, but recovers quickly. Pt with improved hip abduction today compared to last session.    PT Treatment/Interventions ADLs/Self Care Home Management;Gait training;Stair training;Functional mobility training;Therapeutic activities;Therapeutic exercise;Neuromuscular re-education;Balance training;Patient/family education    PT Next Visit Plan review HEP and progress for functional strength and balance, work toward functional gait challenges, narrow BOS, SLS with UE support as ready    Consulted and Agree with Plan of Care Patient             Patient will benefit from skilled therapeutic intervention in order to improve the following deficits and impairments:  Decreased balance, Decreased endurance, Difficulty walking, Decreased coordination, Decreased safety awareness, Decreased strength  Visit Diagnosis: Unsteadiness on feet  Repeated falls  Muscle weakness (generalized)     Problem List Patient Active Problem List   Diagnosis Date Noted   Aortic atherosclerosis ([place]) 06/15/2021   Generalized osteoarthritis of multiple sites 12/25/2020   Chronic pain disorder 12/25/2020   Pelvic mass in female    Bilateral lower extremity edema 04/13/2020   Shortness of breath    Anxiety disorder 10/29/2018   CKD (chronic kidney disease), stage III ([place]) 06/27/2018   COPD (chronic obstructive pulmonary disease)  ([place]) 05/25/2018   Obesity 03/20/2018   Dyspnea on exertion 11/06/2017   Abnormal stress test 11/06/2017   Chronic diastolic CHF (congestive heart failure) ([place]) 10/03/2017   Chronic combined systolic and diastolic heart failure ([place]) 09/04/2017   Left ventricular dysfunction 07/31/2017   Cataract, nuclear sclerotic senile, bilateral 02/01/2017   Gouty arthritis of toe of left foot 08/09/2016   Hyperuricemia 08/09/2016   Osteoarthritis (arthritis due to wear and tear of joints) 01/14/2014   Coronary artery disease 08/02/2011   Mitral regurgitation 08/02/2011   Hx of tobacco abuse 08/02/2011   OVERACTIVE BLADDER 07/02/2010   ACUTE CYSTITIS 05/25/2010   Pure hypercholesterolemia 08/11/2009   PEDAL EDEMA 07/22/2009   GERD 04/11/2008   Essential hypertension 05/30/2007   MYOCARDIAL INFARCTION, HX OF 05/30/2007   Allergic rhinitis 05/30/2007   LOW BACK PAIN 05/30/2007    Juel Burrow, PT, DPT 08/09/2021, 10:16 AM  [facility] @ [place], [place], [address] Phone: [phone]   Fax:  [phone]  Name: SALIMATA CHRISTENSON MRN: [id number] Date of Birth: Jul 13, 1946

## 2021-08-11 ENCOUNTER — Encounter: Payer: Self-pay | Admitting: Rehabilitative and Restorative Service Providers"

## 2021-08-11 ENCOUNTER — Ambulatory Visit: Payer: Medicare Other | Admitting: Rehabilitative and Restorative Service Providers"

## 2021-08-11 ENCOUNTER — Other Ambulatory Visit: Payer: Self-pay

## 2021-08-11 DIAGNOSIS — R2681 Unsteadiness on feet: Secondary | ICD-10-CM

## 2021-08-11 DIAGNOSIS — M6281 Muscle weakness (generalized): Secondary | ICD-10-CM | POA: Diagnosis not present

## 2021-08-11 DIAGNOSIS — R296 Repeated falls: Secondary | ICD-10-CM | POA: Diagnosis not present

## 2021-08-11 NOTE — Therapy (Signed)
[facility] [place], [place], [address] Phone: [phone]   Fax:  [phone]  Physical Therapy Treatment  Patient Details  Name: Tracy [name] MRN: [id number] Date of Birth: Oct 13, 1946 Referring Provider (PT): [name], MD   Encounter Date: 08/11/2021   PT End of Session - 08/11/21 0937     Visit Number 5    Date for PT Re-Evaluation 09/13/21    Authorization Type Medicare    Authorization Time Period 07/14/21 to 09/13/21    Progress Note Due on Visit 10    PT Start Time 0930    PT Stop Time 1010    PT Time Calculation (min) 40 min    Activity Tolerance Patient tolerated treatment well    Behavior During Therapy [facility] for tasks assessed/performed             Past Medical History:  Diagnosis Date   Allergic rhinitis    Anxiety    Arthritis    Bronchitis    CAD (coronary artery disease)    1/19 PCI/DES to [place] for ISR, normal EF.    Cancer ([place])    hx of precancerous cells in right breast    Cervical dysplasia    unsure of procedure, possible "burning" in her late 52s   CHF (congestive heart failure) ([place])    Echo 06/2019: EF 55-60, elevated LVEDP, normal RV SF, mild MAC, mild MR, trivial TR   Complication of anesthesia    COPD (chronic obstructive pulmonary disease) (HCC)    early    Dyspnea    with exertion    Family history of adverse reaction to anesthesia    daughter- problems wiht n/v   GERD (gastroesophageal reflux disease)    Gout    Headache(784.0)    Heart murmur    hx of years ago    Hyperlipidemia    Hypertension    Low back pain    Menopausal syndrome    Myocardial infarct ([place]) 2007   hx of   Overactive bladder    PONV (postoperative nausea and vomiting)     Past Surgical History:  Procedure Laterality Date   ANGIOPLASTY     stent 2007   BREAST LUMPECTOMY WITH RADIOACTIVE SEED LOCALIZATION Right 04/28/2020   Procedure: RIGHT BREAST LUMPECTOMY X 2  WITH  RADIOACTIVE SEED LOCALIZATION;  Surgeon: Donnie Mesa, MD;  Location: [facility];  Service: General;  Laterality: Right;  LMA   CARDIAC CATHETERIZATION     COLONOSCOPY     CORONARY STENT INTERVENTION N/A 11/06/2017   Procedure: CORONARY STENT INTERVENTION;  Surgeon: [name], MD;  Location: [facility];  Service: Cardiovascular;  Laterality: N/A;   LEFT HEART CATH AND CORONARY ANGIOGRAPHY N/A 11/06/2017   Procedure: LEFT HEART CATH AND CORONARY ANGIOGRAPHY;  Surgeon: [name], MD;  Location: [place] [facility];  Service: Cardiovascular;  Laterality: N/A;   LEFT HEART CATH AND CORONARY ANGIOGRAPHY N/A 07/24/2019   Procedure: LEFT HEART CATH AND CORONARY ANGIOGRAPHY;  Surgeon: Burnell Blanks, MD;  Location: [facility];  Service: Cardiovascular;  Laterality: N/A;   RIGHT/LEFT HEART CATH AND CORONARY ANGIOGRAPHY N/A 09/29/2020   Procedure: RIGHT/LEFT HEART CATH AND CORONARY ANGIOGRAPHY;  Surgeon: Belva Crome, MD;  Location: [facility];  Service: Cardiovascular;  Laterality: N/A;   ROBOTIC ASSISTED BILATERAL SALPINGO OOPHERECTOMY Bilateral 11/10/2020   Procedure: XI ROBOTIC ASSISTED BILATERAL SALPINGO OOPHORECTOMY WITH MINI LAPAROTOMY FOR DRAINAGE;  Surgeon: Lafonda Mosses, MD;  Location: [facility];  Service: Gynecology;  Laterality: Bilateral;  MINI LAP FIRST   TONSILLECTOMY     VULVECTOMY N/A 11/10/2020   Procedure: WIDE EXCISION VULVECTOMY;  Surgeon: Lafonda Mosses, MD;  Location: [facility];  Service: Gynecology;  Laterality: N/A;    There were no vitals filed for this visit.   Subjective Assessment - 08/11/21 0936     Subjective "I am going to get a cortisone injection in my knee".  "I can get on and off the toilet now."    Currently in Pain? Yes    Pain Score 5     Pain Location Knee    Pain Orientation Left    Pain Descriptors / Indicators Throbbing                               [facility] Adult PT  Treatment/Exercise - 08/11/21 0001       Ambulation/Gait   Gait Comments Amb down halloway with SPC around PT gym      Timed Up and Go Test   TUG Normal TUG    Normal TUG (seconds) 19   without assistive device     Lumbar Exercises: Aerobic   Nustep L3 x6' with PT present to discuss pt status      Knee/Hip Exercises: Standing   Heel Raises Both;2 sets;10 reps    Heel Raises Limitations 2#    Knee Flexion Strengthening;Both;1 set;10 reps    Knee Flexion Limitations 2# HS curls    Hip Flexion Stengthening;Both;1 set;10 reps    Hip Flexion Limitations 2#    Hip Abduction Stengthening;Both;1 set;10 reps    Abduction Limitations 2#. with UE support    Hip Extension Stengthening;Both;1 set;10 reps;Knee straight    Extension Limitations 2#    Other Standing Knee Exercises Alt LE toe taps on 4" step with UE support 2x10 each      Knee/Hip Exercises: Seated   Long Arc Quad Strengthening;Both;2 sets;10 reps    Long Arc Quad Weight 2 lbs.    Clamshell with TheraBand Red   2x10   Marching Both;2 sets;10 reps    Marching Limitations 2#    Hamstring Curl Strengthening;Both;2 sets;10 reps    Hamstring Limitations red tband    Sit to Sand 2 sets;5 reps;with UE support                       PT Short Term Goals - 08/02/21 1014       PT SHORT TERM GOAL #1   Title Pt will be independent with her initial HEP to improve LE strength and balance.    Status Achieved      PT SHORT TERM GOAL #2   Title Pt will consistently use her SPC at the new adjusted height to improve her mechanics and safety with ambulation.    Status Achieved               PT Long Term Goals - 08/11/21 1016       PT LONG TERM GOAL #1   Title Pt will complete the 5x sit to stand in less than 14 sec without the need for UE support to reflect improvements in LE strength.    Status On-going      PT LONG TERM GOAL #2   Title Pt will complete TUG in less than 14 sec with LRAD to reflect  improvements  in her balance.    Status On-going      PT LONG TERM GOAL #3   Title Pt will score atleast 6 point increase in her BERG score to reflect a significant improvement in her balance and decreas risk of falling.    Status On-going      PT LONG TERM GOAL #4   Title Pt will have atleast 4/5 MMT strength in hip abductors to help with single leg stability.    Status On-going                   Plan - 08/11/21 1013     Clinical Impression Statement Ms Motl is progressing towards goal related activities. She is progressing towards ambulation without SPC with TUG during todays session. Pt able to perform standing ther ex with ankle weights during todays session. She continues to progress with increased activity tolerance and functional tolerance in standing.    PT Treatment/Interventions ADLs/Self Care Home Management;Gait training;Stair training;Functional mobility training;Therapeutic activities;Therapeutic exercise;Neuromuscular re-education;Balance training;Patient/family education    PT Next Visit Plan review HEP and progress for functional strength and balance, work toward functional gait challenges, narrow BOS, SLS with UE support as ready    Consulted and Agree with Plan of Care Patient             Patient will benefit from skilled therapeutic intervention in order to improve the following deficits and impairments:  Decreased balance, Decreased endurance, Difficulty walking, Decreased coordination, Decreased safety awareness, Decreased strength  Visit Diagnosis: Unsteadiness on feet  Repeated falls  Muscle weakness (generalized)     Problem List Patient Active Problem List   Diagnosis Date Noted   Aortic atherosclerosis ([place]) 06/15/2021   Generalized osteoarthritis of multiple sites 12/25/2020   Chronic pain disorder 12/25/2020   Pelvic mass in female    Bilateral lower extremity edema 04/13/2020   Shortness of breath    Anxiety disorder 10/29/2018   CKD  (chronic kidney disease), stage III ([place]) 06/27/2018   COPD (chronic obstructive pulmonary disease) ([place]) 05/25/2018   Obesity 03/20/2018   Dyspnea on exertion 11/06/2017   Abnormal stress test 11/06/2017   Chronic diastolic CHF (congestive heart failure) ([place]) 10/03/2017   Chronic combined systolic and diastolic heart failure ([place]) 09/04/2017   Left ventricular dysfunction 07/31/2017   Cataract, nuclear sclerotic senile, bilateral 02/01/2017   Gouty arthritis of toe of left foot 08/09/2016   Hyperuricemia 08/09/2016   Osteoarthritis (arthritis due to wear and tear of joints) 01/14/2014   Coronary artery disease 08/02/2011   Mitral regurgitation 08/02/2011   Hx of tobacco abuse 08/02/2011   OVERACTIVE BLADDER 07/02/2010   ACUTE CYSTITIS 05/25/2010   Pure hypercholesterolemia 08/11/2009   PEDAL EDEMA 07/22/2009   GERD 04/11/2008   Essential hypertension 05/30/2007   MYOCARDIAL INFARCTION, HX OF 05/30/2007   Allergic rhinitis 05/30/2007   LOW BACK PAIN 05/30/2007    Juel Burrow, PT, DPT 08/11/2021, 10:17 AM  [facility] @ [facility] [place], [place], [address] Phone: [phone]   Fax:  [phone]  Name: Tracy [name] MRN: [id number] Date of Birth: October 30, 1945

## 2021-08-13 ENCOUNTER — Telehealth: Payer: Self-pay | Admitting: Family Medicine

## 2021-08-13 DIAGNOSIS — M25561 Pain in right knee: Secondary | ICD-10-CM | POA: Diagnosis not present

## 2021-08-13 DIAGNOSIS — M25562 Pain in left knee: Secondary | ICD-10-CM | POA: Diagnosis not present

## 2021-08-13 DIAGNOSIS — M170 Bilateral primary osteoarthritis of knee: Secondary | ICD-10-CM | POA: Diagnosis not present

## 2021-08-13 NOTE — Progress Notes (Signed)
  Chronic Care Management   Note  08/13/2021 Name: Tracy [name] MRN: [id number] DOB: 02/05/1946  Tracy [name] is a 75 y.o. year old female who is a primary care patient of [name], Malka So, MD. I reached out to [name] by phone today in response to a referral sent by Ms. Lily L Darrow's PCP, [name], [name], MD.   [name] was given information about Chronic Care Management services today including:  CCM service includes personalized support from designated clinical staff supervised by her physician, including individualized plan of care and coordination with other care providers 24/7 contact phone numbers for assistance for urgent and routine care needs. Service will only be billed when office clinical staff spend 20 minutes or more in a month to coordinate care. Only one practitioner may furnish and bill the service in a calendar month. The patient may stop CCM services at any time (effective at the end of the month) by phone call to the office staff.   Patient did not agree to enrollment in care management services and does not wish to consider at this time.  Follow up plan:   Tatjana Secretary/administrator

## 2021-08-13 NOTE — Progress Notes (Signed)
HPI: [name] is a 75 y.o. female, who is here today to follow on recent OV.  She was last seen on 06/15/21. Since her last visit she has seen neuro,Dr Tracy [name] on 07/06/21.  According to patient, parkinson has been ruled out. Balance has improved with PT, still needs her cane and feels unstable gait sometimes. She has fallen a couple times since her last visit, no serious injury. She is having PT 2 times per week.  Tremor has improved. Exacerbated by certain activities like writing.  Brain CTA done on 08/04/21: 1. No acute intracranial process. 2. No intracranial large vessel occlusion.  She has also seen orthopedics, received knee intra-articular injection, which helped.  Insomnia: This is a chronic problem. States that she is just sleeping a "few hours", 4 hours. Trouble falling and staying asleep. Cannot go back to sleep, wakes up around 4 am. She has not tried OTC medications.  Hypertension:  Medications: Losartan 100 mg daily, amlodipine 5 mg, Imdur 30 mg daily, and metoprolol succinate 100 mg daily. Side effects: None  Negative for unusual or severe headache, visual changes, exertional chest pain, palpitation, dyspnea,  focal weakness, or edema.  Hypokalemia: She is not on KLOR , she is on K+ rich diet. HFrEF: She is not taking Furosemide daily, causes urinary frequency. LE edema has been stable.  CKD 3: She has not noted gross hematuria, foam in urine, or decreased urine output.  Lab Results  Component Value Date   CREATININE 1.12 (H) 04/18/2021   BUN 14 04/18/2021   NA 138 04/18/2021   K 3.9 04/18/2021   CL 107 04/18/2021   CO2 22 04/18/2021   A week ago she noted pruritic rash, she has applied neosporin, and it has improved. Problem is affecting distal aspect of left lower extremity.  Constipation have resolved.  She thinks it is because she is eating more fast food. Still periumbilical and lower abdominal pain but slowly getting better.  She has  had this problem since 10/2020 after gyn surgery.  She is having colonoscopy scheduled for early 09/2021.  She has not noted urinary symptoms, melena, or blood in the stool. She has not identified exacerbating or alleviating factors. Abdominal and pelvic CT on 11/24/20: 1. No acute intra-abdominal or pelvic pathology. 2. Interval resection of the previously seen pelvic mass. No fluid collection or abscess. 3. Severe sigmoid diverticulosis. No bowel obstruction. 4. Aortic Atherosclerosis (ICD10-I70.0).  Review of Systems  Constitutional:  Negative for chills and fever.  HENT:  Negative for mouth sores and sore throat.   Respiratory:  Negative for cough and wheezing.   Gastrointestinal:  Negative for nausea and vomiting.  Genitourinary:  Negative for dysuria and frequency.  Musculoskeletal:  Positive for arthralgias and gait problem.  Neurological:  Negative for syncope and facial asymmetry.  Psychiatric/Behavioral:  Negative for confusion.   Rest see pertinent positives and negatives per HPI.  Current Outpatient Medications on File Prior to Visit  Medication Sig Dispense Refill   acetaminophen (TYLENOL) 500 MG tablet Take 500-1,000 mg by mouth every 6 (six) hours as needed for headache.      albuterol (PROVENTIL) (2.5 MG/3ML) 0.083% nebulizer solution Take 3 mLs (2.5 mg total) by nebulization every 6 (six) hours as needed for wheezing or shortness of breath. 300 mL 5   albuterol (VENTOLIN HFA) 108 (90 Base) MCG/ACT inhaler Inhale 2 puffs into the lungs every 4 (four) hours as needed for wheezing. 1 each 3   allopurinol (ZYLOPRIM) 100  MG tablet TAKE 1 TABLET BY MOUTH EVERY DAY 90 tablet 2   amLODipine (NORVASC) 5 MG tablet Take 1 tablet (5 mg total) by mouth daily. 90 tablet 3   ANORO ELLIPTA 62.5-25 MCG/INH AEPB INHALE 1 PUFF BY MOUTH EVERY DAY 60 each 12   APPLE CIDER VINEGAR PO Take 2,400 mg by mouth daily.     ASPERCREME LIDOCAINE EX Apply 1 application topically daily as needed  (pain).     aspirin EC 81 MG tablet Take 81 mg by mouth daily.     Camphor-Eucalyptus-Menthol (VICKS VAPORUB EX) Apply 1 application topically daily as needed (congestion).     cetirizine (ZYRTEC) 10 MG tablet Take 10 mg by mouth daily.     Cholecalciferol (VITAMIN D3) 50 MCG (2000 UT) TABS Take 4,000 Units by mouth daily.      clopidogrel (PLAVIX) 75 MG tablet TAKE 1 TABLET BY MOUTH DAILY WITH BREAKFAST. 90 tablet 1   Cyanocobalamin (VITAMIN B-12) 5000 MCG SUBL Place 5,000 mcg under the tongue daily.      ezetimibe (ZETIA) 10 MG tablet TAKE 1 TABLET BY MOUTH DAILY 90 tablet 3   fluticasone (FLONASE) 50 MCG/ACT nasal spray SPRAY 2 SPRAYS INTO EACH NOSTRIL EVERY DAY (Patient taking differently: Place 2 sprays into both nostrils daily.) 48 mL 1   furosemide (LASIX) 40 MG tablet Take 40 mg by mouth daily as needed for fluid or edema.     guaiFENesin (MUCINEX) 600 MG 12 hr tablet Take 600 mg by mouth 2 (two) times daily as needed (congestion).     isosorbide mononitrate (IMDUR) 30 MG 24 hr tablet Take 1 tablet (30 mg total) by mouth daily. 90 tablet 3   losartan (COZAAR) 100 MG tablet TAKE 1 TABLET BY MOUTH EVERY DAY 90 tablet 3   metoprolol succinate (TOPROL-XL) 100 MG 24 hr tablet TAKE 1 TABLET BY MOUTH DAILY. TAKE WITH OR IMMEDIATELY FOLLOWING A MEAL. 90 tablet 2   Misc Natural Products (TART CHERRY ADVANCED) CAPS Take 1,000 capsules by mouth daily.      oxymetazoline (AFRIN) 0.05 % nasal spray Place 1 spray into both nostrils 2 (two) times daily as needed for congestion.     pantoprazole (PROTONIX) 40 MG tablet TAKE 1 TABLET BY MOUTH EVERY DAY 90 tablet 2   potassium chloride SA (KLOR-CON) 20 MEQ tablet Take 20 mEq by mouth daily.     Simethicone (GAS-X PO) Take 2 tablets by mouth daily as needed (gas).     pravastatin (PRAVACHOL) 40 MG tablet Take 1 tablet (40 mg total) by mouth every evening. 90 tablet 3   [DISCONTINUED] nitroGLYCERIN (NITROSTAT) 0.4 MG SL tablet Place 1 tablet (0.4 mg total)  under the tongue every 5 (five) minutes as needed for chest pain. 25 tablet 3   No current facility-administered medications on file prior to visit.    Past Medical History:  Diagnosis Date   Allergic rhinitis    Anxiety    Arthritis    Bronchitis    CAD (coronary artery disease)    1/19 PCI/DES to [place] for ISR, normal EF.    Cancer ([place])    hx of precancerous cells in right breast    Cervical dysplasia    unsure of procedure, possible "burning" in her late 44s   CHF (congestive heart failure) ([place])    Echo 06/2019: EF 55-60, elevated LVEDP, normal RV SF, mild MAC, mild MR, trivial TR   Complication of anesthesia    COPD (chronic obstructive pulmonary disease) (  [place])    early    Dyspnea    with exertion    Family history of adverse reaction to anesthesia    daughter- problems wiht n/v   GERD (gastroesophageal reflux disease)    Gout    Headache(784.0)    Heart murmur    hx of years ago    Hyperlipidemia    Hypertension    Low back pain    Menopausal syndrome    Myocardial infarct ([place]) 2007   hx of   Overactive bladder    PONV (postoperative nausea and vomiting)    Allergies  Allergen Reactions   Erythromycin Rash    But isn't certain   Sulfamethoxazole Rash    Social History   Socioeconomic History   Marital status: Married    Spouse name: Not on file   Number of children: 2   Years of education: Not on file   Highest education level: Not on file  Occupational History   Occupation: retired   Occupation: retired    Comment: Scientist, physiological  Tobacco Use   Smoking status: Former    Packs/day: 1.25    Years: 52.00    Pack years: 65.00    Types: Cigarettes    Quit date: 2018    Years since quitting: 4.8   Smokeless tobacco: Never   Tobacco comments:    completely quit May of 2018; period of years she did not smoke   Vaping Use   Vaping Use: Never used  Substance and Sexual Activity   Alcohol use: Yes    Comment: seldom   Drug use: No   Sexual  activity: Not Currently  Other Topics Concern   Not on file  Social History Narrative   Not on file   Social Determinants of Health   Financial Resource Strain: Not on file  Food Insecurity: Not on file  Transportation Needs: Not on file  Physical Activity: Not on file  Stress: Not on file  Social Connections: Not on file   Vitals:   08/16/21 1127  BP: 128/80  Pulse: 90  Resp: 16  SpO2: 97%   Body mass index is 32.68 kg/m.  Physical Exam Vitals and nursing note reviewed.  Constitutional:      General: She is not in acute distress.    Appearance: She is well-developed.  HENT:     Head: Normocephalic and atraumatic.     Mouth/Throat:     Mouth: Mucous membranes are moist.     Pharynx: Oropharynx is clear.  Eyes:     Conjunctiva/sclera: Conjunctivae normal.  Cardiovascular:     Rate and Rhythm: Normal rate and regular rhythm.     Pulses:          Posterior tibial pulses are 2+ on the right side and 2+ on the left side.     Heart sounds: No murmur heard. Pulmonary:     Effort: Pulmonary effort is normal. No respiratory distress.     Breath sounds: Normal breath sounds.  Abdominal:     Palpations: Abdomen is soft. There is no hepatomegaly or mass.     Tenderness: There is no abdominal tenderness.  Lymphadenopathy:     Cervical: No cervical adenopathy.  Skin:    General: Skin is warm.     Findings: No erythema or rash.       Neurological:     General: No focal deficit present.     Mental Status: She is alert and oriented to person, place, and  time.     Cranial Nerves: No cranial nerve deficit.     Motor: Tremor (Hands, mild, not present at rest.) present.     Comments: Unstable gait assisted with a cane.  Psychiatric:     Comments: Well groomed, good eye contact.   ASSESSMENT AND PLAN:  [name] was seen today for follow-up.  Diagnoses and all orders for this visit: Orders Placed This Encounter  Procedures   Pneumococcal conjugate vaccine  20-valent (Prevnar 20)   Basic metabolic panel   Lab Results  Component Value Date   CREATININE 1.41 (H) 08/16/2021   BUN 38 (H) 08/16/2021   NA 141 08/16/2021   K 4.1 08/16/2021   CL 105 08/16/2021   CO2 27 08/16/2021   Venous stasis dermatitis of left lower extremity We discussed differential diagnosis. Recommend topical triamcinolone, small amount, daily as needed. Monitor for new symptoms. Continue furosemide 40 mg 1/2 to 1 tablet daily as needed.  -     triamcinolone cream (KENALOG) 0.1 %; Apply 1 application topically 2 (two) times daily.  Insomnia, unspecified type Recommend trying OTC melatonin from 5 to 15 mg 2 hours before bedtime and with empty stomach. We discussed other pharmacologic options as well as side effects, including risk for falls, we agree on holding on this type of medications. Adequate sleep hygiene also recommended.  Need for pneumococcal vaccination -     Pneumococcal conjugate vaccine 20-valent (Prevnar 20)  Abdominal pain, unspecified abdominal location Problem is chronic. Slowly improving. Abdominal CT did not reveal etiology. Continue monitoring. Having colonoscopy in 09/2021. Instructed about warning signs.  Essential hypertension BP adequately controlled. Continue current management: Losartan 100 mg daily, metoprolol succinate 100 mg daily, amlodipine 5 mg daily, and Imdur 30 mg daily. DASH/low salt diet recommended.  CKD (chronic kidney disease), stage III ([place]) Problem has been stable,Cr 1.12-1.2 and e GFR 48-54. Continue adequate hydration, low-salt diet, avoidance of NSAIDs. Adequate BP control. Continue losartan 100 mg daily.  Tremor of both hands We reviewed possible etiologies. It seems to be essential tremor. For now she is not interested in pharmacologic treatment. We will continue monitoring for changes.  I spent a total of 45 minutes in both face to face and non face to face activities for this visit on the date of this  encounter. During this time history was obtained and documented, examination was performed, prior labs/imaging reviewed, and assessment/plan discussed.  Return in about 6 months (around 02/13/2022).  Jeaneen Cala G. [name], MD  [facility]. [place] office.

## 2021-08-16 ENCOUNTER — Other Ambulatory Visit: Payer: Self-pay

## 2021-08-16 ENCOUNTER — Ambulatory Visit (INDEPENDENT_AMBULATORY_CARE_PROVIDER_SITE_OTHER): Payer: Medicare Other | Admitting: Family Medicine

## 2021-08-16 ENCOUNTER — Encounter: Payer: Self-pay | Admitting: Family Medicine

## 2021-08-16 ENCOUNTER — Ambulatory Visit: Payer: Medicare Other | Admitting: Rehabilitative and Restorative Service Providers"

## 2021-08-16 ENCOUNTER — Encounter: Payer: Self-pay | Admitting: Rehabilitative and Restorative Service Providers"

## 2021-08-16 VITALS — BP 128/80 | HR 90 | Resp 16 | Ht 64.0 in | Wt 190.4 lb

## 2021-08-16 DIAGNOSIS — R251 Tremor, unspecified: Secondary | ICD-10-CM | POA: Diagnosis not present

## 2021-08-16 DIAGNOSIS — I10 Essential (primary) hypertension: Secondary | ICD-10-CM

## 2021-08-16 DIAGNOSIS — R109 Unspecified abdominal pain: Secondary | ICD-10-CM

## 2021-08-16 DIAGNOSIS — R296 Repeated falls: Secondary | ICD-10-CM | POA: Diagnosis not present

## 2021-08-16 DIAGNOSIS — Z23 Encounter for immunization: Secondary | ICD-10-CM

## 2021-08-16 DIAGNOSIS — M6281 Muscle weakness (generalized): Secondary | ICD-10-CM

## 2021-08-16 DIAGNOSIS — G4700 Insomnia, unspecified: Secondary | ICD-10-CM | POA: Diagnosis not present

## 2021-08-16 DIAGNOSIS — I872 Venous insufficiency (chronic) (peripheral): Secondary | ICD-10-CM | POA: Diagnosis not present

## 2021-08-16 DIAGNOSIS — R2681 Unsteadiness on feet: Secondary | ICD-10-CM | POA: Diagnosis not present

## 2021-08-16 DIAGNOSIS — N1831 Chronic kidney disease, stage 3a: Secondary | ICD-10-CM | POA: Diagnosis not present

## 2021-08-16 DIAGNOSIS — I25119 Atherosclerotic heart disease of native coronary artery with unspecified angina pectoris: Secondary | ICD-10-CM

## 2021-08-16 LAB — BASIC METABOLIC PANEL
BUN: 38 mg/dL — ABNORMAL HIGH (ref 6–23)
CO2: 27 mEq/L (ref 19–32)
Calcium: 9.2 mg/dL (ref 8.4–10.5)
Chloride: 105 mEq/L (ref 96–112)
Creatinine, Ser: 1.41 mg/dL — ABNORMAL HIGH (ref 0.40–1.20)
GFR: 37.05 mL/min — ABNORMAL LOW (ref >60.00)
Glucose, Bld: 106 mg/dL — ABNORMAL HIGH (ref 70–99)
Potassium: 4.1 mEq/L (ref 3.5–5.1)
Sodium: 141 mEq/L (ref 135–145)

## 2021-08-16 MED ORDER — TRIAMCINOLONE ACETONIDE 0.1 % EX CREA
1.0000 | TOPICAL_CREAM | Freq: Two times a day (BID) | CUTANEOUS | 1 refills | Status: DC
Start: 2021-08-16 — End: 2021-11-22

## 2021-08-16 NOTE — Assessment & Plan Note (Signed)
We reviewed possible etiologies. It seems to be essential tremor. For now she is not interested in pharmacologic treatment. We will continue monitoring for changes.

## 2021-08-16 NOTE — Assessment & Plan Note (Addendum)
BP adequately controlled. Continue current management: Losartan 100 mg daily, metoprolol succinate 100 mg daily, amlodipine 5 mg daily, and Imdur 30 mg daily. DASH/low salt diet recommended.

## 2021-08-16 NOTE — Patient Instructions (Addendum)
A few things to remember from today's visit:  Essential hypertension - Plan: Basic metabolic panel  Venous stasis dermatitis of left lower extremity - Plan: triamcinolone cream (KENALOG) 0.1 %  Insomnia, unspecified type  If you need refills please call your pharmacy. Do not use My Chart to request refills or for acute issues that need immediate attention. Small amount of triamcinolone daily as needed for rash.   Melatonin 5-15 mg 2 hours before bedtime with empty stomach. Rest not changes. Good sleep hygiene.  Please be sure medication list is accurate. If a new problem present, please set up appointment sooner than planned today.

## 2021-08-16 NOTE — Therapy (Signed)
[facility] @ [place], [place], [address] Phone: [phone]   Fax:  [phone]  Physical Therapy Treatment  Patient Details  Name: Tracy [name] MRN: [id number] Date of Birth: Mar 23, 1946 Referring Provider (PT): Tracy [name], MD   Encounter Date: 08/16/2021   PT End of Session - 08/16/21 1020     Visit Number 6    Date for PT Re-Evaluation 09/13/21    Authorization Type Medicare    Authorization Time Period 07/14/21 to 09/13/21    Progress Note Due on Visit 10    PT Start Time 1015    PT Stop Time 1055    PT Time Calculation (min) 40 min    Activity Tolerance Patient tolerated treatment well    Behavior During Therapy [name] for tasks assessed/performed             Past Medical History:  Diagnosis Date   Allergic rhinitis    Anxiety    Arthritis    Bronchitis    CAD (coronary artery disease)    1/19 PCI/DES to [name] for ISR, normal EF.    Cancer ([name])    hx of precancerous cells in right breast    Cervical dysplasia    unsure of procedure, possible "burning" in her late 6s   CHF (congestive heart failure) ([facility]acy [name])    Echo 06/2019: EF 55-60, elevated LVEDP, normal RV SF, mild MAC, mild MR, trivial TR   Complication of anesthesia    COPD (chronic obstructive pulmonary disease) (HCC)    early    Dyspnea    with exertion    Family history of adverse reaction to anesthesia    daughter- problems wiht n/v   GERD (gastroesophageal reflux disease)    Gout    Headache(784.0)    Heart murmur    hx of years ago    Hyperlipidemia    Hypertension    Low back pain    Menopausal syndrome    Myocardial infarct ([name]) 2007   hx of   Overactive bladder    PONV (postoperative nausea and vomiting)     Past Surgical History:  Procedure Laterality Date   ANGIOPLASTY     stent 2007   BREAST LUMPECTOMY WITH RADIOACTIVE SEED LOCALIZATION Right 04/28/2020   Procedure: RIGHT BREAST LUMPECTOMY X 2  WITH  RADIOACTIVE SEED LOCALIZATION;  Surgeon: Tracy Mesa, MD;  Location: [name];  Service: General;  Laterality: Right;  LMA   CARDIAC CATHETERIZATION     COLONOSCOPY     CORONARY STENT INTERVENTION N/A 11/06/2017   Procedure: CORONARY STENT INTERVENTION;  Surgeon: Tracy Bush, MD;  Location: [name];  Service: Cardiovascular;  Laterality: N/A;   LEFT HEART CATH AND CORONARY ANGIOGRAPHY N/A 11/06/2017   Procedure: LEFT HEART CATH AND CORONARY ANGIOGRAPHY;  Surgeon: Tracy Bush, MD;  Location: [name];  Service: Cardiovascular;  Laterality: N/A;   LEFT HEART CATH AND CORONARY ANGIOGRAPHY N/A 07/24/2019   Procedure: LEFT HEART CATH AND CORONARY ANGIOGRAPHY;  Surgeon: Tracy Blanks, MD;  Location: [name];  Service: Cardiovascular;  Laterality: N/A;   RIGHT/LEFT HEART CATH AND CORONARY ANGIOGRAPHY N/A 09/29/2020   Procedure: RIGHT/LEFT HEART CATH AND CORONARY ANGIOGRAPHY;  Surgeon: Tracy Crome, MD;  Location: [name];  Service: Cardiovascular;  Laterality: N/A;   ROBOTIC ASSISTED BILATERAL SALPINGO OOPHERECTOMY Bilateral 11/10/2020   Procedure: XI ROBOTIC ASSISTED BILATERAL SALPINGO OOPHORECTOMY WITH MINI LAPAROTOMY FOR DRAINAGE;  Surgeon: Tracy Mosses, MD;  Location: [name];  Service: Gynecology;  Laterality: Bilateral;  MINI LAP FIRST   TONSILLECTOMY     VULVECTOMY N/A 11/10/2020   Procedure: WIDE EXCISION VULVECTOMY;  Surgeon: Tracy Mosses, MD;  Location: [name];  Service: Gynecology;  Laterality: N/A;    There were no vitals filed for this visit.   Subjective Assessment - 08/16/21 1018     Subjective I got my cortisone shots in both knees on Friday.  My knees feel good, but my hip is out of whack now.    Patient Stated Goals be able to walk without using her cane    Currently in Pain? Yes    Pain Score 3     Pain Location Hip    Pain Orientation Left                                [facility] Adult PT Treatment/Exercise - 08/16/21 0001       Lumbar Exercises: Aerobic   Nustep L4 x6' with PT present to discuss pt status      Knee/Hip Exercises: Standing   Heel Raises Both;2 sets;10 reps    Heel Raises Limitations 2#    Knee Flexion Strengthening;Both;1 set;10 reps    Knee Flexion Limitations 2# HS curls    Hip Flexion Stengthening;Both;1 set;10 reps    Hip Flexion Limitations 2#    Hip Abduction Stengthening;Both;1 set;10 reps    Abduction Limitations 2#. with UE support    Hip Extension Stengthening;Both;1 set;10 reps;Knee straight    Extension Limitations 2#    Other Standing Knee Exercises Alt LE toe taps on 6" step with UE support 2x10 each      Knee/Hip Exercises: Seated   Long Arc Quad Strengthening;Both;2 sets;10 reps    Long Arc Quad Weight 2 lbs.    Clamshell with TheraBand Red   2x10   Marching Both;2 sets;10 reps    Marching Limitations 2#    Hamstring Curl Strengthening;Both;2 sets;10 reps    Hamstring Limitations red tband    Sit to Sand 2 sets;5 reps;with UE support                       PT Short Term Goals - 08/02/21 1014       PT SHORT TERM GOAL #1   Title Pt will be independent with her initial HEP to improve LE strength and balance.    Status Achieved      PT SHORT TERM GOAL #2   Title Pt will consistently use her SPC at the new adjusted height to improve her mechanics and safety with ambulation.    Status Achieved               PT Long Term Goals - 08/11/21 1016       PT LONG TERM GOAL #1   Title Pt will complete the 5x sit to stand in less than 14 sec without the need for UE support to reflect improvements in LE strength.    Status On-going      PT LONG TERM GOAL #2   Title Pt will complete TUG in less than 14 sec with LRAD to reflect improvements in her balance.    Status On-going      PT LONG TERM GOAL #3   Title Pt will score atleast 6 point increase in her BERG  score to reflect  a significant improvement in her balance and decreas risk of falling.    Status On-going      PT LONG TERM GOAL #4   Title Pt will have atleast 4/5 MMT strength in hip abductors to help with single leg stability.    Status On-going                   Plan - 08/16/21 1100     Clinical Impression Statement [name] continues to progress towards goal related activities. She continues to progress with increased strength and activity tolerance and ability to perform standing ther ex with weights. Pt with decreased knee pain today following injections on Friday, but did have some L hip pain.    PT Treatment/Interventions ADLs/Self Care Home Management;Gait training;Stair training;Functional mobility training;Therapeutic activities;Therapeutic exercise;Neuromuscular re-education;Balance training;Patient/family education    PT Next Visit Plan review HEP and progress for functional strength and balance, work toward functional gait challenges, narrow BOS, SLS with UE support as ready    Consulted and Agree with Plan of Care Patient             Patient will benefit from skilled therapeutic intervention in order to improve the following deficits and impairments:  Decreased balance, Decreased endurance, Difficulty walking, Decreased coordination, Decreased safety awareness, Decreased strength  Visit Diagnosis: Unsteadiness on feet  Repeated falls  Muscle weakness (generalized)     Problem List Patient Active Problem List   Diagnosis Date Noted   Aortic atherosclerosis ([place]) 06/15/2021   Generalized osteoarthritis of multiple sites 12/25/2020   Chronic pain disorder 12/25/2020   Pelvic mass in female    Bilateral lower extremity edema 04/13/2020   Shortness of breath    Anxiety disorder 10/29/2018   CKD (chronic kidney disease), stage III ([place]) 06/27/2018   COPD (chronic obstructive pulmonary disease) ([place]) 05/25/2018   Obesity 03/20/2018   Dyspnea on  exertion 11/06/2017   Abnormal stress test 11/06/2017   Chronic diastolic CHF (congestive heart failure) ([place]) 10/03/2017   Chronic combined systolic and diastolic heart failure ([place]) 09/04/2017   Left ventricular dysfunction 07/31/2017   Cataract, nuclear sclerotic senile, bilateral 02/01/2017   Gouty arthritis of toe of left foot 08/09/2016   Hyperuricemia 08/09/2016   Osteoarthritis (arthritis due to wear and tear of joints) 01/14/2014   Coronary artery disease 08/02/2011   Mitral regurgitation 08/02/2011   OVERACTIVE BLADDER 07/02/2010   ACUTE CYSTITIS 05/25/2010   Pure hypercholesterolemia 08/11/2009   PEDAL EDEMA 07/22/2009   GERD 04/11/2008   Essential hypertension 05/30/2007   MYOCARDIAL INFARCTION, HX OF 05/30/2007   Allergic rhinitis 05/30/2007   LOW BACK PAIN 05/30/2007    Juel Burrow, PT, DPT 08/16/2021, 11:02 AM  [facility] @ [place], [place], [address] Phone: [phone]   Fax:  [phone]  Name: Tracy [name] MRN: [id number] Date of Birth: 02-Aug-1946

## 2021-08-16 NOTE — Assessment & Plan Note (Addendum)
Problem has been stable,Cr 1.12-1.2 and e GFR 48-54. Continue adequate hydration, low-salt diet, avoidance of NSAIDs. Adequate BP control. Continue losartan 100 mg daily.

## 2021-08-18 ENCOUNTER — Encounter: Payer: Self-pay | Admitting: Rehabilitative and Restorative Service Providers"

## 2021-08-18 ENCOUNTER — Other Ambulatory Visit: Payer: Self-pay

## 2021-08-18 ENCOUNTER — Ambulatory Visit: Payer: Medicare Other | Attending: Family Medicine | Admitting: Rehabilitative and Restorative Service Providers"

## 2021-08-18 DIAGNOSIS — M6281 Muscle weakness (generalized): Secondary | ICD-10-CM | POA: Insufficient documentation

## 2021-08-18 DIAGNOSIS — R2681 Unsteadiness on feet: Secondary | ICD-10-CM | POA: Diagnosis not present

## 2021-08-18 DIAGNOSIS — R296 Repeated falls: Secondary | ICD-10-CM | POA: Insufficient documentation

## 2021-08-18 NOTE — Addendum Note (Signed)
Addended by: Rosalyn Gess D on: 08/18/2021 10:35 AM   Modules accepted: Orders

## 2021-08-18 NOTE — Addendum Note (Signed)
Addended by: Rosalyn Gess D on: 08/18/2021 10:22 AM   Modules accepted: Orders

## 2021-08-18 NOTE — Therapy (Signed)
[facility] @ [place], [place], [address] Phone: [phone]   Fax:  [phone]  Physical Therapy Treatment  Patient Details  Name: Tracy [name] MRN: [id number] Date of Birth: July 14, 1946 Referring Provider (PT): [name], MD   Encounter Date: 08/18/2021   PT End of Session - 08/18/21 0938     Visit Number 7    Date for PT Re-Evaluation 09/13/21    Authorization Type Medicare    Authorization Time Period 07/14/21 to 09/13/21    Progress Note Due on Visit 10    PT Start Time 0928    PT Stop Time 1010    PT Time Calculation (min) 42 min    Activity Tolerance Patient tolerated treatment well    Behavior During Therapy [facility] for tasks assessed/performed             Past Medical History:  Diagnosis Date   Allergic rhinitis    Anxiety    Arthritis    Bronchitis    CAD (coronary artery disease)    1/19 PCI/DES to [facility] for ISR, normal EF.    Cancer ([place])    hx of precancerous cells in right breast    Cervical dysplasia    unsure of procedure, possible "burning" in her late 49s   CHF (congestive heart failure) ([place])    Echo 06/2019: EF 55-60, elevated LVEDP, normal RV SF, mild MAC, mild MR, trivial TR   Complication of anesthesia    COPD (chronic obstructive pulmonary disease) (HCC)    early    Dyspnea    with exertion    Family history of adverse reaction to anesthesia    daughter- problems wiht n/v   GERD (gastroesophageal reflux disease)    Gout    Headache(784.0)    Heart murmur    hx of years ago    Hyperlipidemia    Hypertension    Low back pain    Menopausal syndrome    Myocardial infarct ([place]) 2007   hx of   Overactive bladder    PONV (postoperative nausea and vomiting)     Past Surgical History:  Procedure Laterality Date   ANGIOPLASTY     stent 2007   BREAST LUMPECTOMY WITH RADIOACTIVE SEED LOCALIZATION Right 04/28/2020   Procedure: RIGHT BREAST LUMPECTOMY X 2  WITH  RADIOACTIVE SEED LOCALIZATION;  Surgeon: Donnie Mesa, MD;  Location: [facility];  Service: General;  Laterality: Right;  LMA   CARDIAC CATHETERIZATION     COLONOSCOPY     CORONARY STENT INTERVENTION N/A 11/06/2017   Procedure: CORONARY STENT INTERVENTION;  Surgeon: [name], MD;  Location: [facility];  Service: Cardiovascular;  Laterality: N/A;   LEFT HEART CATH AND CORONARY ANGIOGRAPHY N/A 11/06/2017   Procedure: LEFT HEART CATH AND CORONARY ANGIOGRAPHY;  Surgeon: [name], MD;  Location: [facility];  Service: Cardiovascular;  Laterality: N/A;   LEFT HEART CATH AND CORONARY ANGIOGRAPHY N/A 07/24/2019   Procedure: LEFT HEART CATH AND CORONARY ANGIOGRAPHY;  Surgeon: Burnell Blanks, MD;  Location: [facility];  Service: Cardiovascular;  Laterality: N/A;   RIGHT/LEFT HEART CATH AND CORONARY ANGIOGRAPHY N/A 09/29/2020   Procedure: RIGHT/LEFT HEART CATH AND CORONARY ANGIOGRAPHY;  Surgeon: Belva Crome, MD;  Location: [facility];  Service: Cardiovascular;  Laterality: N/A;   ROBOTIC ASSISTED BILATERAL SALPINGO OOPHERECTOMY Bilateral 11/10/2020   Procedure: XI ROBOTIC ASSISTED BILATERAL SALPINGO OOPHORECTOMY WITH MINI LAPAROTOMY FOR DRAINAGE;  Surgeon: Lafonda Mosses, MD;  Location: [facility];  Service: Gynecology;  Laterality: Bilateral;  MINI LAP FIRST   TONSILLECTOMY     VULVECTOMY N/A 11/10/2020   Procedure: WIDE EXCISION VULVECTOMY;  Surgeon: Lafonda Mosses, MD;  Location: [facility];  Service: Gynecology;  Laterality: N/A;    There were no vitals filed for this visit.   Subjective Assessment - 08/18/21 0934     Subjective I didn't sleep well last night.  Pt reports that her cortisone injection is still helping her knees    Patient Stated Goals be able to walk without using her cane    Currently in Pain? Yes    Pain Score 2     Pain Location Hip    Pain Orientation Left    Pain Descriptors / Indicators Sore                                [facility] Adult PT Treatment/Exercise - 08/18/21 0001       Transfers   Five time sit to stand comments  12.9 sec with UE use      Ambulation/Gait   Stairs Yes    Stairs Assistance 7: Independent    Stair Management Technique One rail Right;Step to pattern    Number of Stairs 12    Height of Stairs 6    Gait Comments Amb outside on sidewalk without assistive device with SBA over various inclines with curb cutouts.  No loss of balance.      High Level Balance   High Level Balance Activities Backward walking;Tandem walking   with PT providing handheld assist     Lumbar Exercises: Aerobic   Nustep L4 x6' with PT present to discuss pt status      Knee/Hip Exercises: Seated   Long Arc Quad Strengthening;Both;2 sets;10 reps    Long Arc Quad Weight 3 lbs.    Clamshell with TheraBand Red   2x10   Marching Both;2 sets;10 reps    Marching Limitations 3    Hamstring Curl Strengthening;Both;2 sets;10 reps    Hamstring Limitations red tband                       PT Short Term Goals - 08/02/21 1014       PT SHORT TERM GOAL #1   Title Pt will be independent with her initial HEP to improve LE strength and balance.    Status Achieved      PT SHORT TERM GOAL #2   Title Pt will consistently use her SPC at the new adjusted height to improve her mechanics and safety with ambulation.    Status Achieved               PT Long Term Goals - 08/18/21 1025       PT LONG TERM GOAL #1   Title Pt will complete the 5x sit to stand in less than 14 sec without the need for UE support to reflect improvements in LE strength.    Status Partially Met   Pt able to perform in less than 13 seconds, but requires UE support.     PT LONG TERM GOAL #2   Title Pt will complete TUG in less than 14 sec with LRAD to reflect improvements in her balance.    Status On-going      PT LONG TERM GOAL #3   Title Pt will  score atleast 6 point increase in her  BERG score to reflect a significant improvement in her balance and decreas risk of falling.    Status On-going      PT LONG TERM GOAL #4   Title Pt will have atleast 4/5 MMT strength in hip abductors to help with single leg stability.    Status On-going                   Plan - 08/18/21 1021     Clinical Impression Statement Pt will be out of town next week assisting her daughter with pet sitting.  Reviewed seated HEP and provided red theraband to ensure that pt has some exercises to work towards while she is out of town.  Pt was able to return demonstration and verbalized understanding. Pt was able to ambulate outside over various inclines without assistive device and without cuing or close SBA for steadying.  Pt practiced negotiating the equivalent to a flight of stairs with unilateral support to allow her to more easily navigate steps at her daughter's home.  Pt has improved greatly on her 5 times sit to/from stand time than last assessessment. Pt requires handheld assist with tandem and retrogait with unsteadiness noted.    PT Treatment/Interventions ADLs/Self Care Home Management;Gait training;Stair training;Functional mobility training;Therapeutic activities;Therapeutic exercise;Neuromuscular re-education;Balance training;Patient/family education    PT Next Visit Plan review HEP and progress for functional strength and balance, work toward functional gait challenges, narrow BOS, SLS with UE support as ready    Consulted and Agree with Plan of Care Patient             Patient will benefit from skilled therapeutic intervention in order to improve the following deficits and impairments:  Decreased balance, Decreased endurance, Difficulty walking, Decreased coordination, Decreased safety awareness, Decreased strength  Visit Diagnosis: Unsteadiness on feet  Repeated falls  Muscle weakness (generalized)     Problem List Patient Active Problem List   Diagnosis Date Noted    Tremor of both hands 08/16/2021   Aortic atherosclerosis ([place]) 06/15/2021   Generalized osteoarthritis of multiple sites 12/25/2020   Chronic pain disorder 12/25/2020   Pelvic mass in female    Bilateral lower extremity edema 04/13/2020   Shortness of breath    Anxiety disorder 10/29/2018   CKD (chronic kidney disease), stage III ([place]) 06/27/2018   COPD (chronic obstructive pulmonary disease) ([place]) 05/25/2018   Obesity 03/20/2018   Dyspnea on exertion 11/06/2017   Abnormal stress test 11/06/2017   Chronic diastolic CHF (congestive heart failure) ([place]) 10/03/2017   Chronic combined systolic and diastolic heart failure ([place]) 09/04/2017   Left ventricular dysfunction 07/31/2017   Cataract, nuclear sclerotic senile, bilateral 02/01/2017   Gouty arthritis of toe of left foot 08/09/2016   Hyperuricemia 08/09/2016   Osteoarthritis (arthritis due to wear and tear of joints) 01/14/2014   Coronary artery disease 08/02/2011   Mitral regurgitation 08/02/2011   OVERACTIVE BLADDER 07/02/2010   ACUTE CYSTITIS 05/25/2010   Pure hypercholesterolemia 08/11/2009   PEDAL EDEMA 07/22/2009   GERD 04/11/2008   Essential hypertension 05/30/2007   MYOCARDIAL INFARCTION, HX OF 05/30/2007   Allergic rhinitis 05/30/2007   LOW BACK PAIN 05/30/2007    Juel Burrow, PT, DPT 08/18/2021, 10:26 AM  [facility] @ [place], [place], [address] Phone: [phone]   Fax:  [phone]  Name: Tracy [name] MRN: [id number] Date of Birth: [birth year]

## 2021-08-20 LAB — URINALYSIS, ROUTINE W REFLEX MICROSCOPIC
Bilirubin Urine: NEGATIVE
Hgb urine dipstick: NEGATIVE
Ketones, ur: NEGATIVE
Nitrite: POSITIVE — AB
Specific Gravity, Urine: >=1.03 — AB (ref 1.000–1.030)
Urine Glucose: NEGATIVE
Urobilinogen, UA: 0.2 (ref 0.0–1.0)
pH: 5.5 (ref 5.0–8.0)

## 2021-08-20 NOTE — Addendum Note (Signed)
Addended by: Rosalyn Gess D on: 08/20/2021 10:31 AM   Modules accepted: Orders

## 2021-08-23 ENCOUNTER — Encounter: Payer: No Typology Code available for payment source | Admitting: Rehabilitative and Restorative Service Providers"

## 2021-08-23 ENCOUNTER — Other Ambulatory Visit: Payer: Self-pay

## 2021-08-23 DIAGNOSIS — R8290 Unspecified abnormal findings in urine: Secondary | ICD-10-CM

## 2021-08-25 ENCOUNTER — Encounter: Payer: No Typology Code available for payment source | Admitting: Rehabilitative and Restorative Service Providers"

## 2021-08-30 ENCOUNTER — Other Ambulatory Visit (INDEPENDENT_AMBULATORY_CARE_PROVIDER_SITE_OTHER): Payer: Medicare Other

## 2021-08-30 ENCOUNTER — Other Ambulatory Visit: Payer: Self-pay

## 2021-08-30 ENCOUNTER — Encounter: Payer: Self-pay | Admitting: Rehabilitative and Restorative Service Providers"

## 2021-08-30 ENCOUNTER — Ambulatory Visit: Payer: Medicare Other | Admitting: Rehabilitative and Restorative Service Providers"

## 2021-08-30 DIAGNOSIS — R2681 Unsteadiness on feet: Secondary | ICD-10-CM | POA: Diagnosis not present

## 2021-08-30 DIAGNOSIS — R296 Repeated falls: Secondary | ICD-10-CM

## 2021-08-30 DIAGNOSIS — M6281 Muscle weakness (generalized): Secondary | ICD-10-CM | POA: Diagnosis not present

## 2021-08-30 DIAGNOSIS — R8290 Unspecified abnormal findings in urine: Secondary | ICD-10-CM | POA: Diagnosis not present

## 2021-08-30 LAB — URINALYSIS, ROUTINE W REFLEX MICROSCOPIC
Bilirubin Urine: NEGATIVE
Hgb urine dipstick: NEGATIVE
Ketones, ur: NEGATIVE
Nitrite: NEGATIVE
RBC / HPF: NONE SEEN (ref 0–?)
Specific Gravity, Urine: 1.01 (ref 1.000–1.030)
Total Protein, Urine: NEGATIVE
Urine Glucose: NEGATIVE
Urobilinogen, UA: 0.2 (ref 0.0–1.0)
pH: 6 (ref 5.0–8.0)

## 2021-08-30 NOTE — Therapy (Signed)
[facility] @ [place], [place], [address] Phone: [phone]   Fax:  [phone]  Physical Therapy Treatment  Patient Details  Name: Tracy [name] MRN: [id number] Date of Birth: 06-09-1946 Referring Provider (PT): [name], MD   Encounter Date: 08/30/2021   PT End of Session - 08/30/21 0933     Visit Number 8    Date for PT Re-Evaluation 09/13/21    Authorization Type Medicare    Authorization Time Period 07/14/21 to 09/13/21    Progress Note Due on Visit 10    PT Start Time 0928    PT Stop Time 1010    PT Time Calculation (min) 42 min    Activity Tolerance Patient tolerated treatment well    Behavior During Therapy [facility] for tasks assessed/performed             Past Medical History:  Diagnosis Date   Allergic rhinitis    Anxiety    Arthritis    Bronchitis    CAD (coronary artery disease)    1/19 PCI/DES to [place] for ISR, normal EF.    Cancer ([place])    hx of precancerous cells in right breast    Cervical dysplasia    unsure of procedure, possible "burning" in her late 67s   CHF (congestive heart failure) ([place])    Echo 06/2019: EF 55-60, elevated LVEDP, normal RV SF, mild MAC, mild MR, trivial TR   Complication of anesthesia    COPD (chronic obstructive pulmonary disease) (HCC)    early    Dyspnea    with exertion    Family history of adverse reaction to anesthesia    daughter- problems wiht n/v   GERD (gastroesophageal reflux disease)    Gout    Headache(784.0)    Heart murmur    hx of years ago    Hyperlipidemia    Hypertension    Low back pain    Menopausal syndrome    Myocardial infarct ([place]) 2007   hx of   Overactive bladder    PONV (postoperative nausea and vomiting)     Past Surgical History:  Procedure Laterality Date   ANGIOPLASTY     stent 2007   BREAST LUMPECTOMY WITH RADIOACTIVE SEED LOCALIZATION Right 04/28/2020   Procedure: RIGHT BREAST LUMPECTOMY X 2  WITH  RADIOACTIVE SEED LOCALIZATION;  Surgeon: Donnie Mesa, MD;  Location: [facility];  Service: General;  Laterality: Right;  LMA   CARDIAC CATHETERIZATION     COLONOSCOPY     CORONARY STENT INTERVENTION N/A 11/06/2017   Procedure: CORONARY STENT INTERVENTION;  Surgeon: [name], MD;  Location: [facility];  Service: Cardiovascular;  Laterality: N/A;   LEFT HEART CATH AND CORONARY ANGIOGRAPHY N/A 11/06/2017   Procedure: LEFT HEART CATH AND CORONARY ANGIOGRAPHY;  Surgeon: [name], MD;  Location: [place] [facility];  Service: Cardiovascular;  Laterality: N/A;   LEFT HEART CATH AND CORONARY ANGIOGRAPHY N/A 07/24/2019   Procedure: LEFT HEART CATH AND CORONARY ANGIOGRAPHY;  Surgeon: Burnell Blanks, MD;  Location: [facility];  Service: Cardiovascular;  Laterality: N/A;   RIGHT/LEFT HEART CATH AND CORONARY ANGIOGRAPHY N/A 09/29/2020   Procedure: RIGHT/LEFT HEART CATH AND CORONARY ANGIOGRAPHY;  Surgeon: Belva Crome, MD;  Location: [facility];  Service: Cardiovascular;  Laterality: N/A;   ROBOTIC ASSISTED BILATERAL SALPINGO OOPHERECTOMY Bilateral 11/10/2020   Procedure: XI ROBOTIC ASSISTED BILATERAL SALPINGO OOPHORECTOMY WITH MINI LAPAROTOMY FOR DRAINAGE;  Surgeon: Lafonda Mosses, MD;  Location: [facility];  Service: Gynecology;  Laterality: Bilateral;  MINI LAP FIRST   TONSILLECTOMY     VULVECTOMY N/A 11/10/2020   Procedure: WIDE EXCISION VULVECTOMY;  Surgeon: Lafonda Mosses, MD;  Location: [facility];  Service: Gynecology;  Laterality: N/A;    There were no vitals filed for this visit.   Subjective Assessment - 08/30/21 0932     Subjective Pt reports that she is having some pain today, attributes partially to colder temperatures outside and partially to not being in PT due to being out of town pet sitting.    Patient Stated Goals be able to walk without using her cane    Currently in Pain? Yes    Pain Score 3     Pain Location Knee     Pain Orientation Left    Pain Descriptors / Indicators Aching    Pain Type Chronic pain                               [facility] Adult PT Treatment/Exercise - 08/30/21 0001       Timed Up and Go Test   TUG Normal TUG    Normal TUG (seconds) 13.3   without AD     High Level Balance   High Level Balance Activities Tandem walking;Side stepping;Backward walking      Lumbar Exercises: Aerobic   Nustep L5 x6' with PT present to discuss pt status      Lumbar Exercises: Machines for Strengthening   Other Lumbar Machine Exercise Lat pull 25# 2x10      Lumbar Exercises: Standing   Heel Raises 20 reps      Knee/Hip Exercises: Standing   Hip Flexion Stengthening;Both;1 set;10 reps    Hip Flexion Limitations red tband    Hip Abduction Stengthening;Both;1 set;10 reps    Abduction Limitations red tband    Hip Extension Stengthening;Both;1 set;10 reps;Knee straight    Extension Limitations red tband      Knee/Hip Exercises: Seated   Long Arc Quad Strengthening;Both;2 sets;10 reps    Long Arc Quad Weight 3 lbs.    Clamshell with TheraBand Red   2x10   Marching Both;2 sets;10 reps    Marching Limitations 3    Hamstring Curl Strengthening;Both;2 sets;10 reps    Hamstring Limitations red tband    Sit to Sand 2 sets;5 reps;with UE support   standing on blue foam                      PT Short Term Goals - 08/02/21 1014       PT SHORT TERM GOAL #1   Title Pt will be independent with her initial HEP to improve LE strength and balance.    Status Achieved      PT SHORT TERM GOAL #2   Title Pt will consistently use her SPC at the new adjusted height to improve her mechanics and safety with ambulation.    Status Achieved               PT Long Term Goals - 08/30/21 1018       PT LONG TERM GOAL #1   Title Pt will complete the 5x sit to stand in less than 14 sec without the need for UE support to reflect improvements in LE strength.    Status Partially  Met      PT LONG  TERM GOAL #2   Title Pt will complete TUG in less than 14 sec with LRAD to reflect improvements in her balance.    Status Achieved      PT LONG TERM GOAL #3   Title Pt will score atleast 6 point increase in her BERG score to reflect a significant improvement in her balance and decreas risk of falling.    Status On-going      PT LONG TERM GOAL #4   Title Pt will have atleast 4/5 MMT strength in hip abductors to help with single leg stability.    Status On-going                   Plan - 08/30/21 1009     Clinical Impression Statement Pt reports that she has made at least 50% improvement since starting PT. She is progressing with improved strength and had increased time noted on TUG. Pt has been compliant with HEP while she was pet sitting for her family. Pt continues with most difficulty with sit to/from stand, especially on blue foam.    PT Treatment/Interventions ADLs/Self Care Home Management;Gait training;Stair training;Functional mobility training;Therapeutic activities;Therapeutic exercise;Neuromuscular re-education;Balance training;Patient/family education    PT Next Visit Plan review HEP and progress for functional strength and balance, work toward functional gait challenges, narrow BOS, SLS with UE support as ready    Consulted and Agree with Plan of Care Patient             Patient will benefit from skilled therapeutic intervention in order to improve the following deficits and impairments:  Decreased balance, Decreased endurance, Difficulty walking, Decreased coordination, Decreased safety awareness, Decreased strength  Visit Diagnosis: Unsteadiness on feet  Repeated falls  Muscle weakness (generalized)     Problem List Patient Active Problem List   Diagnosis Date Noted   Tremor of both hands 08/16/2021   Aortic atherosclerosis ([place]) 06/15/2021   Generalized osteoarthritis of multiple sites 12/25/2020   Chronic pain disorder 12/25/2020    Pelvic mass in female    Bilateral lower extremity edema 04/13/2020   Shortness of breath    Anxiety disorder 10/29/2018   CKD (chronic kidney disease), stage III ([place]) 06/27/2018   COPD (chronic obstructive pulmonary disease) ([place]) 05/25/2018   Obesity 03/20/2018   Dyspnea on exertion 11/06/2017   Abnormal stress test 11/06/2017   Chronic diastolic CHF (congestive heart failure) ([place]) 10/03/2017   Chronic combined systolic and diastolic heart failure ([place]) 09/04/2017   Left ventricular dysfunction 07/31/2017   Cataract, nuclear sclerotic senile, bilateral 02/01/2017   Gouty arthritis of toe of left foot 08/09/2016   Hyperuricemia 08/09/2016   Osteoarthritis (arthritis due to wear and tear of joints) 01/14/2014   Coronary artery disease 08/02/2011   Mitral regurgitation 08/02/2011   OVERACTIVE BLADDER 07/02/2010   ACUTE CYSTITIS 05/25/2010   Pure hypercholesterolemia 08/11/2009   PEDAL EDEMA 07/22/2009   GERD 04/11/2008   Essential hypertension 05/30/2007   MYOCARDIAL INFARCTION, HX OF 05/30/2007   Allergic rhinitis 05/30/2007   LOW BACK PAIN 05/30/2007    Juel Burrow, PT, DPT 08/30/2021, 10:19 AM  [facility] @ [place], [place], [address] Phone: [phone]   Fax:  [phone]  Name: ASHLYNN GUNNELS MRN: [id number] Date of Birth: Jan 04, 1946

## 2021-09-01 ENCOUNTER — Ambulatory Visit: Payer: Medicare Other | Admitting: Rehabilitative and Restorative Service Providers"

## 2021-09-01 ENCOUNTER — Other Ambulatory Visit: Payer: Self-pay

## 2021-09-01 ENCOUNTER — Encounter: Payer: Self-pay | Admitting: Rehabilitative and Restorative Service Providers"

## 2021-09-01 DIAGNOSIS — R296 Repeated falls: Secondary | ICD-10-CM | POA: Diagnosis not present

## 2021-09-01 DIAGNOSIS — M6281 Muscle weakness (generalized): Secondary | ICD-10-CM

## 2021-09-01 DIAGNOSIS — R2681 Unsteadiness on feet: Secondary | ICD-10-CM | POA: Diagnosis not present

## 2021-09-01 LAB — URINE CULTURE
MICRO NUMBER:: 12633387
SPECIMEN QUALITY:: ADEQUATE

## 2021-09-01 NOTE — Therapy (Signed)
[facility] @ [place], [place], [address] Phone: [phone]   Fax:  [phone]  Physical Therapy Treatment  Patient Details  Name: Tracy [name] MRN: [id number] Date of Birth: 26-Jul-1946 Referring Provider (PT): [name], MD   Encounter Date: 09/01/2021   PT End of Session - 09/01/21 0932     Visit Number 9    Date for PT Re-Evaluation 09/13/21    Authorization Type Medicare    Authorization Time Period 07/14/21 to 09/13/21    Progress Note Due on Visit 10    PT Start Time 0930    PT Stop Time 1010    PT Time Calculation (min) 40 min    Activity Tolerance Patient tolerated treatment well    Behavior During Therapy [facility] for tasks assessed/performed             Past Medical History:  Diagnosis Date   Allergic rhinitis    Anxiety    Arthritis    Bronchitis    CAD (coronary artery disease)    1/19 PCI/DES to [place] for ISR, normal EF.    Cancer ([place])    hx of precancerous cells in right breast    Cervical dysplasia    unsure of procedure, possible "burning" in her late 54s   CHF (congestive heart failure) ([place])    Echo 06/2019: EF 55-60, elevated LVEDP, normal RV SF, mild MAC, mild MR, trivial TR   Complication of anesthesia    COPD (chronic obstructive pulmonary disease) (HCC)    early    Dyspnea    with exertion    Family history of adverse reaction to anesthesia    daughter- problems wiht n/v   GERD (gastroesophageal reflux disease)    Gout    Headache(784.0)    Heart murmur    hx of years ago    Hyperlipidemia    Hypertension    Low back pain    Menopausal syndrome    Myocardial infarct ([place]) 2007   hx of   Overactive bladder    PONV (postoperative nausea and vomiting)     Past Surgical History:  Procedure Laterality Date   ANGIOPLASTY     stent 2007   BREAST LUMPECTOMY WITH RADIOACTIVE SEED LOCALIZATION Right 04/28/2020   Procedure: RIGHT BREAST LUMPECTOMY X 2  WITH  RADIOACTIVE SEED LOCALIZATION;  Surgeon: Donnie Mesa, MD;  Location: [facility];  Service: General;  Laterality: Right;  LMA   CARDIAC CATHETERIZATION     COLONOSCOPY     CORONARY STENT INTERVENTION N/A 11/06/2017   Procedure: CORONARY STENT INTERVENTION;  Surgeon: [name], MD;  Location: [facility];  Service: Cardiovascular;  Laterality: N/A;   LEFT HEART CATH AND CORONARY ANGIOGRAPHY N/A 11/06/2017   Procedure: LEFT HEART CATH AND CORONARY ANGIOGRAPHY;  Surgeon: [name], MD;  Location: [facility];  Service: Cardiovascular;  Laterality: N/A;   LEFT HEART CATH AND CORONARY ANGIOGRAPHY N/A 07/24/2019   Procedure: LEFT HEART CATH AND CORONARY ANGIOGRAPHY;  Surgeon: Burnell Blanks, MD;  Location: [facility];  Service: Cardiovascular;  Laterality: N/A;   RIGHT/LEFT HEART CATH AND CORONARY ANGIOGRAPHY N/A 09/29/2020   Procedure: RIGHT/LEFT HEART CATH AND CORONARY ANGIOGRAPHY;  Surgeon: Belva Crome, MD;  Location: [facility];  Service: Cardiovascular;  Laterality: N/A;   ROBOTIC ASSISTED BILATERAL SALPINGO OOPHERECTOMY Bilateral 11/10/2020   Procedure: XI ROBOTIC ASSISTED BILATERAL SALPINGO OOPHORECTOMY WITH MINI LAPAROTOMY FOR DRAINAGE;  Surgeon: Lafonda Mosses, MD;  [facility];  Service: Gynecology;  Laterality: Bilateral;  MINI LAP FIRST   TONSILLECTOMY     VULVECTOMY N/A 11/10/2020   Procedure: WIDE EXCISION VULVECTOMY;  Surgeon: Lafonda Mosses, MD;  [facility];  Service: Gynecology;  Laterality: N/A;    There were no vitals filed for this visit.   Subjective Assessment - 09/01/21 0931     Subjective My knee is still hurting.    Patient Stated Goals be able to walk without using her cane    Currently in Pain? Yes    Pain Score 5     Pain Location Knee    Pain Orientation Left    Pain Descriptors / Indicators Aching    Pain Type Chronic pain                                [facility] Adult PT Treatment/Exercise - 09/01/21 0001       Transfers   Five time sit to stand comments  16.1 sec with BUE   pt reports slower "because it's hurting"     Ambulation/Gait   Gait Comments Pt amb in PT gym without assistive device with less anatalgic gait noted following manual therapy.      Lumbar Exercises: Aerobic   Nustep L5 x6' with PT present to discuss pt status      Knee/Hip Exercises: Standing   Other Standing Knee Exercises Alt LE toe taps on 6" step with UE support x10 each      Knee/Hip Exercises: Seated   Long Arc Quad Strengthening;Both;2 sets;10 reps    Long Arc Quad Weight 3 lbs.    Clamshell with TheraBand Red   2x10   Marching Both;2 sets;10 reps    Marching Limitations 3    Hamstring Curl Strengthening;Both;2 sets;10 reps    Hamstring Limitations red tband      Manual Therapy   Manual Therapy Soft tissue mobilization;Myofascial release    Manual therapy comments in sitting    Soft tissue mobilization To L quad and IT band    Myofascial Release manual trigger point release to L quad and lateral thigh                       PT Short Term Goals - 08/02/21 1014       PT SHORT TERM GOAL #1   Title Pt will be independent with her initial HEP to improve LE strength and balance.    Status Achieved      PT SHORT TERM GOAL #2   Title Pt will consistently use her SPC at the new adjusted height to improve her mechanics and safety with ambulation.    Status Achieved               PT Long Term Goals - 09/01/21 1030       PT LONG TERM GOAL #1   Title Pt will complete the 5x sit to stand in less than 14 sec without the need for UE support to reflect improvements in LE strength.    Status Partially Met      PT LONG TERM GOAL #3   Title Pt will score atleast 6 point increase in her BERG score to reflect a significant improvement in her balance and decreas risk of falling.    Status On-going      PT LONG TERM GOAL #4  Title Pt will  have atleast 4/5 MMT strength in hip abductors to help with single leg stability.    Status On-going                   Plan - 09/01/21 1003     Clinical Impression Statement Pt with increased trigger points and tightness noted with L quad and IT band.  Following manual therapy, pt reported pain decreased to 3/10 with pt just reporting OA pain instead of overall knee pain. Educated pt in self manual therapy at home with rolling pin and she returned demonstration and verbalized understanding. Pt limited in standing exercises today secondary to pain, but able to complete seated ther ex with cuing for slow velocity.    PT Treatment/Interventions ADLs/Self Care Home Management;Gait training;Stair training;Functional mobility training;Therapeutic activities;Therapeutic exercise;Neuromuscular re-education;Balance training;Patient/family education;Manual techniques    PT Next Visit Plan review HEP and progress for functional strength and balance, work toward functional gait challenges, narrow BOS, SLS with UE support as ready    Consulted and Agree with Plan of Care Patient             Patient will benefit from skilled therapeutic intervention in order to improve the following deficits and impairments:  Decreased balance, Decreased endurance, Difficulty walking, Decreased coordination, Decreased safety awareness, Decreased strength  Visit Diagnosis: Unsteadiness on feet  Repeated falls  Muscle weakness (generalized)     Problem List Patient Active Problem List   Diagnosis Date Noted   Tremor of both hands 08/16/2021   Aortic atherosclerosis ([place]) 06/15/2021   Generalized osteoarthritis of multiple sites 12/25/2020   Chronic pain disorder 12/25/2020   Pelvic mass in female    Bilateral lower extremity edema 04/13/2020   Shortness of breath    Anxiety disorder 10/29/2018   CKD (chronic kidney disease), stage III ([place]) 06/27/2018   COPD (chronic obstructive pulmonary disease)  ([place]) 05/25/2018   Obesity 03/20/2018   Dyspnea on exertion 11/06/2017   Abnormal stress test 11/06/2017   Chronic diastolic CHF (congestive heart failure) ([place]) 10/03/2017   Chronic combined systolic and diastolic heart failure ([place]) 09/04/2017   Left ventricular dysfunction 07/31/2017   Cataract, nuclear sclerotic senile, bilateral 02/01/2017   Gouty arthritis of toe of left foot 08/09/2016   Hyperuricemia 08/09/2016   Osteoarthritis (arthritis due to wear and tear of joints) 01/14/2014   Coronary artery disease 08/02/2011   Mitral regurgitation 08/02/2011   OVERACTIVE BLADDER 07/02/2010   ACUTE CYSTITIS 05/25/2010   Pure hypercholesterolemia 08/11/2009   PEDAL EDEMA 07/22/2009   GERD 04/11/2008   Essential hypertension 05/30/2007   MYOCARDIAL INFARCTION, HX OF 05/30/2007   Allergic rhinitis 05/30/2007   LOW BACK PAIN 05/30/2007    Juel Burrow, PT, DPT 09/01/2021, 10:31 AM  [facility] @ [place], [place], [address] Phone: [phone]   Fax:  [phone]  Name: DEVI HOPMAN MRN: [id number] Date of Birth: 12/18/1945

## 2021-09-03 ENCOUNTER — Other Ambulatory Visit: Payer: Self-pay | Admitting: Family Medicine

## 2021-09-03 MED ORDER — NITROFURANTOIN MONOHYD MACRO 100 MG PO CAPS: 100.0000 mg | ORAL_CAPSULE | Freq: Two times a day (BID) | ORAL | 0 refills | Status: AC

## 2021-09-06 ENCOUNTER — Other Ambulatory Visit: Payer: Self-pay

## 2021-09-06 ENCOUNTER — Ambulatory Visit: Payer: Medicare Other

## 2021-09-06 DIAGNOSIS — R2681 Unsteadiness on feet: Secondary | ICD-10-CM

## 2021-09-06 DIAGNOSIS — R296 Repeated falls: Secondary | ICD-10-CM | POA: Diagnosis not present

## 2021-09-06 DIAGNOSIS — M6281 Muscle weakness (generalized): Secondary | ICD-10-CM

## 2021-09-06 NOTE — Therapy (Signed)
[facility] @ [place] [place], [place], [address] Phone: [phone]   Fax:  [phone]  Physical Therapy Treatment  Patient Details  Name: Tracy [name] MRN: [id number] Date of Birth: 1946-09-18 Referring Provider (PT): [name], MD   Encounter Date: 09/06/2021 Progress Note Reporting Period 07/14/21 to 09/06/21  See note below for Objective Data and Assessment of Progress/Goals.      PT End of Session - 09/06/21 1012     Visit Number 10    Date for PT Re-Evaluation 09/13/21    Authorization Type Medicare    Progress Note Due on Visit 76    PT Start Time 0934    PT Stop Time 1015    PT Time Calculation (min) 41 min    Activity Tolerance Patient tolerated treatment well    Behavior During Therapy [facility] for tasks assessed/performed             Past Medical History:  Diagnosis Date   Allergic rhinitis    Anxiety    Arthritis    Bronchitis    CAD (coronary artery disease)    1/19 PCI/DES to [place] for ISR, normal EF.    Cancer ([place])    hx of precancerous cells in right breast    Cervical dysplasia    unsure of procedure, possible "burning" in her late 45s   CHF (congestive heart failure) ([place])    Echo 06/2019: EF 55-60, elevated LVEDP, normal RV SF, mild MAC, mild MR, trivial TR   Complication of anesthesia    COPD (chronic obstructive pulmonary disease) (HCC)    early    Dyspnea    with exertion    Family history of adverse reaction to anesthesia    daughter- problems wiht n/v   GERD (gastroesophageal reflux disease)    Gout    Headache(784.0)    Heart murmur    hx of years ago    Hyperlipidemia    Hypertension    Low back pain    Menopausal syndrome    Myocardial infarct ([place]) 2007   hx of   Overactive bladder    PONV (postoperative nausea and vomiting)     Past Surgical History:  Procedure Laterality Date   ANGIOPLASTY     stent 2007   BREAST LUMPECTOMY WITH RADIOACTIVE SEED  LOCALIZATION Right 04/28/2020   Procedure: RIGHT BREAST LUMPECTOMY X 2  WITH RADIOACTIVE SEED LOCALIZATION;  Surgeon: Donnie Mesa, MD;  Location: [facility];  Service: General;  Laterality: Right;  LMA   CARDIAC CATHETERIZATION     COLONOSCOPY     CORONARY STENT INTERVENTION N/A 11/06/2017   Procedure: CORONARY STENT INTERVENTION;  Surgeon: [name], MD;  Location: [place] [facility];  Service: Cardiovascular;  Laterality: N/A;   LEFT HEART CATH AND CORONARY ANGIOGRAPHY N/A 11/06/2017   Procedure: LEFT HEART CATH AND CORONARY ANGIOGRAPHY;  Surgeon: [name], MD;  Location: [place] [facility];  Service: Cardiovascular;  Laterality: N/A;   LEFT HEART CATH AND CORONARY ANGIOGRAPHY N/A 07/24/2019   Procedure: LEFT HEART CATH AND CORONARY ANGIOGRAPHY;  Surgeon: Burnell Blanks, MD;  Location: [place] [facility];  Service: Cardiovascular;  Laterality: N/A;   RIGHT/LEFT HEART CATH AND CORONARY ANGIOGRAPHY N/A 09/29/2020   Procedure: RIGHT/LEFT HEART CATH AND CORONARY ANGIOGRAPHY;  Surgeon: Belva Crome, MD;  Location: [facility];  Service: Cardiovascular;  Laterality: N/A;   ROBOTIC ASSISTED BILATERAL SALPINGO OOPHERECTOMY Bilateral 11/10/2020  Procedure: XI ROBOTIC ASSISTED BILATERAL SALPINGO OOPHORECTOMY WITH MINI LAPAROTOMY FOR DRAINAGE;  Surgeon: Lafonda Mosses, MD;  [facility];  Service: Gynecology;  Laterality: Bilateral;  MINI LAP FIRST   TONSILLECTOMY     VULVECTOMY N/A 11/10/2020   Procedure: WIDE EXCISION VULVECTOMY;  Surgeon: Lafonda Mosses, MD;  [facility];  Service: Gynecology;  Laterality: N/A;    There were no vitals filed for this visit.   Subjective Assessment - 09/06/21 0931     Subjective My knee hasn't been too bad.  I've been using Thermacare wraps.    Patient Stated Goals be able to walk without using her cane    Currently in Pain? Yes    Pain Score 1     Pain Location Knee    Pain Orientation Left     Pain Descriptors / Indicators Aching    Pain Type Chronic pain    Pain Onset More than a month ago    Pain Frequency Constant    Aggravating Factors  standing, walking    Pain Relieving Factors rolling with rolling pin, thermacare wrap                [facility] PT Assessment - 09/06/21 0001       Assessment   Medical Diagnosis unstable gait    Referring Provider (PT) [name], MD      Precautions   Precautions None      Prior Function   Level of Independence Independent      Transfers   Five time sit to stand comments  15.3 with Bil UEs      Timed Up and Go Test   TUG Normal TUG    Normal TUG (seconds) 11.5   without cane                          [facility] Adult PT Treatment/Exercise - 09/06/21 0001       Ambulation/Gait   Gait Comments Pt amb in PT gym without assistive device- fatigue and need to rest x 2 minutes after      Timed Up and Go Test   TUG Normal TUG    Normal TUG (seconds) 11.5      High Level Balance   High Level Balance Activities Tandem walking;Side stepping;Backward walking      Lumbar Exercises: Aerobic   Nustep L5 x6' with PT present to discuss pt status      Lumbar Exercises: Standing   Heel Raises 20 reps      Knee/Hip Exercises: Seated   Long Arc Quad Strengthening;Both;2 sets;10 reps    Long Arc Quad Weight 3 lbs.    Clamshell with TheraBand Red   2x10   Hamstring Curl Strengthening;Both;2 sets;10 reps    Hamstring Limitations red tband    Sit to Sand 2 sets;5 reps;with UE support                       PT Short Term Goals - 08/02/21 1014       PT SHORT TERM GOAL #1   Title Pt will be independent with her initial HEP to improve LE strength and balance.    Status Achieved      PT SHORT TERM GOAL #2   Title Pt will consistently use her SPC at the new adjusted height to improve her mechanics and safety with ambulation.    Status Achieved  PT Long Term Goals - 09/06/21 1000        PT LONG TERM GOAL #1   Title Pt will complete the 5x sit to stand in less than 14 sec without the need for UE support to reflect improvements in LE strength.    Baseline 15.3 with UEs support    Time 8    Period Weeks    Status On-going      PT LONG TERM GOAL #2   Title Pt will complete TUG in less than 14 sec with LRAD to reflect improvements in her balance.    Baseline 11.5 without cane    Status Achieved      PT LONG TERM GOAL #3   Title Pt will score atleast 6 point increase in her BERG score to reflect a significant improvement in her balance and decreas risk of falling.    Status On-going      PT LONG TERM GOAL #4   Title Pt will have atleast 4/5 MMT strength in hip abductors to help with single leg stability.    Status On-going                   Plan - 09/06/21 0959     Clinical Impression Statement Pt reports that she is using her cane 50% of the time, mostly when going out in the community and unlevel surface.  TUG and 5x sit to stand tests are both improved since the start of care, indicating overall improved balance.  Pt is using tissue mobilization with rolling pin on Lt leg to help with pain.  Pt did well with balance tasks today and required min UE support with balance activities and stand by assist for safety.  Pt will continue to benefit from skilled PT to address LE strength, balance and endurance to improve safety and independence at home and in the community.    PT Treatment/Interventions ADLs/Self Care Home Management;Gait training;Stair training;Functional mobility training;Therapeutic activities;Therapeutic exercise;Neuromuscular re-education;Balance training;Patient/family education;Manual techniques    PT Next Visit Plan Balance activities, functional strength. LE strength and endurance.    PT Home Exercise Plan Access Code [email or web address]    Recommended Other Services initial cert is signed.             Patient will benefit from skilled therapeutic  intervention in order to improve the following deficits and impairments:  Decreased balance, Decreased endurance, Difficulty walking, Decreased coordination, Decreased safety awareness, Decreased strength  Visit Diagnosis: Unsteadiness on feet  Repeated falls  Muscle weakness (generalized)     Problem List Patient Active Problem List   Diagnosis Date Noted   Tremor of both hands 08/16/2021   Aortic atherosclerosis ([place]) 06/15/2021   Generalized osteoarthritis of multiple sites 12/25/2020   Chronic pain disorder 12/25/2020   Pelvic mass in female    Bilateral lower extremity edema 04/13/2020   Shortness of breath    Anxiety disorder 10/29/2018   CKD (chronic kidney disease), stage III ([place]) 06/27/2018   COPD (chronic obstructive pulmonary disease) ([place]) 05/25/2018   Obesity 03/20/2018   Dyspnea on exertion 11/06/2017   Abnormal stress test 11/06/2017   Chronic diastolic CHF (congestive heart failure) ([place]) 10/03/2017   Chronic combined systolic and diastolic heart failure ([place]) 09/04/2017   Left ventricular dysfunction 07/31/2017   Cataract, nuclear sclerotic senile, bilateral 02/01/2017   Gouty arthritis of toe of left foot 08/09/2016   Hyperuricemia 08/09/2016   Osteoarthritis (arthritis due to wear and tear of joints) 01/14/2014   Coronary  artery disease 08/02/2011   Mitral regurgitation 08/02/2011   OVERACTIVE BLADDER 07/02/2010   ACUTE CYSTITIS 05/25/2010   Pure hypercholesterolemia 08/11/2009   PEDAL EDEMA 07/22/2009   GERD 04/11/2008   Essential hypertension 05/30/2007   MYOCARDIAL INFARCTION, HX OF 05/30/2007   Allergic rhinitis 05/30/2007   LOW BACK PAIN 05/30/2007    Sigurd Sos, PT 09/06/21 10:13 AM   [facility] @ [place], [place], [address] Phone: [phone]   Fax:  [phone]  Name: Tracy [name] MRN: [id number] Date of Birth: Nov 09, 1945

## 2021-09-13 ENCOUNTER — Encounter: Payer: Medicare Other | Admitting: Rehabilitative and Restorative Service Providers"

## 2021-09-15 ENCOUNTER — Encounter: Payer: Self-pay | Admitting: Rehabilitative and Restorative Service Providers"

## 2021-09-15 ENCOUNTER — Other Ambulatory Visit: Payer: Self-pay

## 2021-09-15 ENCOUNTER — Ambulatory Visit: Payer: Medicare Other | Admitting: Rehabilitative and Restorative Service Providers"

## 2021-09-15 DIAGNOSIS — R2681 Unsteadiness on feet: Secondary | ICD-10-CM | POA: Diagnosis not present

## 2021-09-15 DIAGNOSIS — M6281 Muscle weakness (generalized): Secondary | ICD-10-CM | POA: Diagnosis not present

## 2021-09-15 DIAGNOSIS — R296 Repeated falls: Secondary | ICD-10-CM

## 2021-09-15 NOTE — Therapy (Signed)
[facility] @ [address] [place], [place], [address] Phone: [phone]   Fax:  [phone]  Physical Therapy Treatment  Patient Details  Name: Tracy [name] MRN: [id number] Date of Birth: 1946/01/04 Referring Provider (PT): [name], MD   Encounter Date: 09/15/2021   PT End of Session - 09/15/21 0936     Visit Number 11    Date for PT Re-Evaluation 09/13/21    Authorization Type Medicare    Authorization Time Period 07/14/21 to 09/13/21    Progress Note Due on Visit 20    PT Start Time 0930    PT Stop Time 1010    PT Time Calculation (min) 40 min    Activity Tolerance Patient tolerated treatment well    Behavior During Therapy [facility] for tasks assessed/performed             Past Medical History:  Diagnosis Date   Allergic rhinitis    Anxiety    Arthritis    Bronchitis    CAD (coronary artery disease)    1/19 PCI/DES to [place] for ISR, normal EF.    Cancer ([place])    hx of precancerous cells in right breast    Cervical dysplasia    unsure of procedure, possible "burning" in her late 65s   CHF (congestive heart failure) ([place])    Echo 06/2019: EF 55-60, elevated LVEDP, normal RV SF, mild MAC, mild MR, trivial TR   Complication of anesthesia    COPD (chronic obstructive pulmonary disease) (HCC)    early    Dyspnea    with exertion    Family history of adverse reaction to anesthesia    daughter- problems wiht n/v   GERD (gastroesophageal reflux disease)    Gout    Headache(784.0)    Heart murmur    hx of years ago    Hyperlipidemia    Hypertension    Low back pain    Menopausal syndrome    Myocardial infarct ([place]) 2007   hx of   Overactive bladder    PONV (postoperative nausea and vomiting)     Past Surgical History:  Procedure Laterality Date   ANGIOPLASTY     stent 2007   BREAST LUMPECTOMY WITH RADIOACTIVE SEED LOCALIZATION Right 04/28/2020   Procedure: RIGHT BREAST LUMPECTOMY X 2  WITH  RADIOACTIVE SEED LOCALIZATION;  Surgeon: Donnie Mesa, MD;  Location: [facility];  Service: General;  Laterality: Right;  LMA   CARDIAC CATHETERIZATION     COLONOSCOPY     CORONARY STENT INTERVENTION N/A 11/06/2017   Procedure: CORONARY STENT INTERVENTION;  Surgeon: [name], MD;  Location: [facility];  Service: Cardiovascular;  Laterality: N/A;   LEFT HEART CATH AND CORONARY ANGIOGRAPHY N/A 11/06/2017   Procedure: LEFT HEART CATH AND CORONARY ANGIOGRAPHY;  Surgeon: [name], MD;  Location: [facility];  Service: Cardiovascular;  Laterality: N/A;   LEFT HEART CATH AND CORONARY ANGIOGRAPHY N/A 07/24/2019   Procedure: LEFT HEART CATH AND CORONARY ANGIOGRAPHY;  Surgeon: Burnell Blanks, MD;  Location: [facility];  Service: Cardiovascular;  Laterality: N/A;   RIGHT/LEFT HEART CATH AND CORONARY ANGIOGRAPHY N/A 09/29/2020   Procedure: RIGHT/LEFT HEART CATH AND CORONARY ANGIOGRAPHY;  Surgeon: Belva Crome, MD;  Location: [facility];  Service: Cardiovascular;  Laterality: N/A;   ROBOTIC ASSISTED BILATERAL SALPINGO OOPHERECTOMY Bilateral 11/10/2020   Procedure: XI ROBOTIC ASSISTED BILATERAL SALPINGO OOPHORECTOMY WITH MINI LAPAROTOMY FOR DRAINAGE;  Surgeon: Lafonda Mosses, MD;  [facility];  Service: Gynecology;  Laterality: Bilateral;  MINI LAP FIRST   TONSILLECTOMY     VULVECTOMY N/A 11/10/2020   Procedure: WIDE EXCISION VULVECTOMY;  Surgeon: Lafonda Mosses, MD;  [facility];  Service: Gynecology;  Laterality: N/A;    There were no vitals filed for this visit.   Subjective Assessment - 09/15/21 0935     Subjective I have one of those heat pads on my knee    Patient Stated Goals be able to walk without using her cane    Currently in Pain? Yes    Pain Score 2     Pain Location Knee    Pain Orientation Left    Pain Descriptors / Indicators Aching                               [facility] Adult  PT Treatment/Exercise - 09/15/21 0001       Ambulation/Gait   Gait Comments Pt amb in PT gym x2 min without assistive device- fatigue and need to rest x 2 minutes after      High Level Balance   High Level Balance Activities Tandem walking;Side stepping;Backward walking    High Level Balance Comments x3 laps each in PT gym with HHA for tandem and retrogait.      Lumbar Exercises: Aerobic   Nustep L5 x6' with PT present to discuss pt status   346 steps     Knee/Hip Exercises: Seated   Long Arc Quad Strengthening;Both;2 sets;10 reps    Long Arc Quad Weight 3 lbs.    Clamshell with TheraBand Red   2x10   Marching Both;2 sets;10 reps    Marching Limitations 3    Hamstring Curl Strengthening;Both;2 sets;10 reps    Hamstring Limitations red tband    Sit to Sand 2 sets;5 reps;with UE support      Manual Therapy   Manual Therapy Soft tissue mobilization;Myofascial release    Manual therapy comments in sitting    Soft tissue mobilization To L quad and IT band    Myofascial Release manual trigger point release to L quad and lateral thigh                       PT Short Term Goals - 08/02/21 1014       PT SHORT TERM GOAL #1   Title Pt will be independent with her initial HEP to improve LE strength and balance.    Status Achieved      PT SHORT TERM GOAL #2   Title Pt will consistently use her SPC at the new adjusted height to improve her mechanics and safety with ambulation.    Status Achieved               PT Long Term Goals - 09/15/21 1021       PT LONG TERM GOAL #1   Title Pt will complete the 5x sit to stand in less than 14 sec without the need for UE support to reflect improvements in LE strength.    Status On-going   continues to require UE support     PT LONG TERM GOAL #3   Title Pt will score atleast 6 point increase in her BERG score to reflect a significant improvement in her balance and decreas risk of falling.    Status On-going  PT LONG  TERM GOAL #4   Title Pt will have atleast 4/5 MMT strength in hip abductors to help with single leg stability.    Status On-going                   Plan - 09/15/21 1011     Clinical Impression Statement Pt continues to report having some unsteadiness when ambulating outside without her SPC, but states that she was trying to do some raking. Pt able to tolerate ambulation of 2 minutes prior ot needing a seated recovery period.  Pt continues to have relief of pain with manual therapy.  Pt initially with 2/10 pain at beginning of session and decreased to 0/10 pain following manual therapy. Pt has coloscopy scheduled for Monday of next week.    PT Treatment/Interventions ADLs/Self Care Home Management;Gait training;Stair training;Functional mobility training;Therapeutic activities;Therapeutic exercise;Neuromuscular re-education;Balance training;Patient/family education;Manual techniques    PT Next Visit Plan Balance activities, functional strength. LE strength and endurance.    Consulted and Agree with Plan of Care Patient             Patient will benefit from skilled therapeutic intervention in order to improve the following deficits and impairments:  Decreased balance, Decreased endurance, Difficulty walking, Decreased coordination, Decreased safety awareness, Decreased strength  Visit Diagnosis: Unsteadiness on feet  Repeated falls  Muscle weakness (generalized)     Problem List Patient Active Problem List   Diagnosis Date Noted   Tremor of both hands 08/16/2021   Aortic atherosclerosis ([place]) 06/15/2021   Generalized osteoarthritis of multiple sites 12/25/2020   Chronic pain disorder 12/25/2020   Pelvic mass in female    Bilateral lower extremity edema 04/13/2020   Shortness of breath    Anxiety disorder 10/29/2018   CKD (chronic kidney disease), stage III ([place]) 06/27/2018   COPD (chronic obstructive pulmonary disease) ([place]) 05/25/2018   Obesity 03/20/2018    Dyspnea on exertion 11/06/2017   Abnormal stress test 11/06/2017   Chronic diastolic CHF (congestive heart failure) ([place]) 10/03/2017   Chronic combined systolic and diastolic heart failure ([place]) 09/04/2017   Left ventricular dysfunction 07/31/2017   Cataract, nuclear sclerotic senile, bilateral 02/01/2017   Gouty arthritis of toe of left foot 08/09/2016   Hyperuricemia 08/09/2016   Osteoarthritis (arthritis due to wear and tear of joints) 01/14/2014   Coronary artery disease 08/02/2011   Mitral regurgitation 08/02/2011   OVERACTIVE BLADDER 07/02/2010   ACUTE CYSTITIS 05/25/2010   Pure hypercholesterolemia 08/11/2009   PEDAL EDEMA 07/22/2009   GERD 04/11/2008   Essential hypertension 05/30/2007   MYOCARDIAL INFARCTION, HX OF 05/30/2007   Allergic rhinitis 05/30/2007   LOW BACK PAIN 05/30/2007    Juel Burrow, PT, DPT 09/15/2021, 10:22 AM  [facility] @ [place], [place], [address] Phone: [phone]   Fax:  [phone]  Name: KAISLEE CHAO MRN: [id number] Date of Birth: [birth year]

## 2021-09-20 ENCOUNTER — Other Ambulatory Visit: Payer: Self-pay

## 2021-09-20 ENCOUNTER — Ambulatory Visit (AMBULATORY_SURGERY_CENTER): Payer: Medicare Other | Admitting: Internal Medicine

## 2021-09-20 ENCOUNTER — Encounter: Payer: Self-pay | Admitting: Internal Medicine

## 2021-09-20 VITALS — BP 110/53 | HR 75 | Temp 97.3°F | Resp 12 | Ht 64.0 in | Wt 190.0 lb

## 2021-09-20 DIAGNOSIS — Z86010 Personal history of colonic polyps: Secondary | ICD-10-CM

## 2021-09-20 DIAGNOSIS — D122 Benign neoplasm of ascending colon: Secondary | ICD-10-CM | POA: Diagnosis not present

## 2021-09-20 DIAGNOSIS — K5900 Constipation, unspecified: Secondary | ICD-10-CM

## 2021-09-20 DIAGNOSIS — R1030 Lower abdominal pain, unspecified: Secondary | ICD-10-CM | POA: Diagnosis not present

## 2021-09-20 DIAGNOSIS — I10 Essential (primary) hypertension: Secondary | ICD-10-CM | POA: Diagnosis not present

## 2021-09-20 DIAGNOSIS — D123 Benign neoplasm of transverse colon: Secondary | ICD-10-CM | POA: Diagnosis not present

## 2021-09-20 DIAGNOSIS — J449 Chronic obstructive pulmonary disease, unspecified: Secondary | ICD-10-CM | POA: Diagnosis not present

## 2021-09-20 DIAGNOSIS — I2510 Atherosclerotic heart disease of native coronary artery without angina pectoris: Secondary | ICD-10-CM | POA: Diagnosis not present

## 2021-09-20 MED ORDER — SODIUM CHLORIDE 0.9 % IV SOLN: 500.0000 mL | Freq: Once | INTRAVENOUS | Status: DC

## 2021-09-20 NOTE — Progress Notes (Signed)
HISTORY OF PRESENT ILLNESS:  Tracy [name] is a 75 y.o. female who was evaluated in the office May 13, 2021 regarding chronic constipation, lower abdominal pain, GERD, and history of sessile serrated adenoma.  She is now for colonoscopy. REVIEW OF SYSTEMS:  All non-GI ROS negative.  Past Medical History:  Diagnosis Date   Allergic rhinitis    Anxiety    Arthritis    Bronchitis    CAD (coronary artery disease)    1/19 PCI/DES to [place] for ISR, normal EF.    Cancer ([place])    hx of precancerous cells in right breast    Cervical dysplasia    unsure of procedure, possible "burning" in her late 9s   CHF (congestive heart failure) ([place])    Echo 06/2019: EF 55-60, elevated LVEDP, normal RV SF, mild MAC, mild MR, trivial TR   Complication of anesthesia    COPD (chronic obstructive pulmonary disease) (HCC)    early    Dyspnea    with exertion    Family history of adverse reaction to anesthesia    daughter- problems wiht n/v   GERD (gastroesophageal reflux disease)    Gout    Headache(784.0)    Heart murmur    hx of years ago    Hyperlipidemia    Hypertension    Low back pain    Menopausal syndrome    Myocardial infarct ([place]) 2007   hx of   Overactive bladder    PONV (postoperative nausea and vomiting)     Past Surgical History:  Procedure Laterality Date   ANGIOPLASTY     stent 2007   BREAST LUMPECTOMY WITH RADIOACTIVE SEED LOCALIZATION Right 04/28/2020   Procedure: RIGHT BREAST LUMPECTOMY X 2  WITH RADIOACTIVE SEED LOCALIZATION;  Surgeon: Donnie Mesa, MD;  Location: [facility];  Service: General;  Laterality: Right;  LMA   CARDIAC CATHETERIZATION     COLONOSCOPY     CORONARY STENT INTERVENTION N/A 11/06/2017   Procedure: CORONARY STENT INTERVENTION;  Surgeon: [name], MD;  Location: [place];  Service: Cardiovascular;  Laterality: N/A;   LEFT HEART CATH AND CORONARY ANGIOGRAPHY N/A 11/06/2017   Procedure: LEFT HEART CATH AND  CORONARY ANGIOGRAPHY;  Surgeon: [name], MD;  Location: [facility];  Service: Cardiovascular;  Laterality: N/A;   LEFT HEART CATH AND CORONARY ANGIOGRAPHY N/A 07/24/2019   Procedure: LEFT HEART CATH AND CORONARY ANGIOGRAPHY;  Surgeon: Burnell Blanks, MD;  Location: [facility];  Service: Cardiovascular;  Laterality: N/A;   RIGHT/LEFT HEART CATH AND CORONARY ANGIOGRAPHY N/A 09/29/2020   Procedure: RIGHT/LEFT HEART CATH AND CORONARY ANGIOGRAPHY;  Surgeon: Belva Crome, MD;  Location: [facility];  Service: Cardiovascular;  Laterality: N/A;   ROBOTIC ASSISTED BILATERAL SALPINGO OOPHERECTOMY Bilateral 11/10/2020   Procedure: XI ROBOTIC ASSISTED BILATERAL SALPINGO OOPHORECTOMY WITH MINI LAPAROTOMY FOR DRAINAGE;  Surgeon: Lafonda Mosses, MD;  [facility];  Service: Gynecology;  Laterality: Bilateral;  MINI LAP FIRST   TONSILLECTOMY     VULVECTOMY N/A 11/10/2020   Procedure: WIDE EXCISION VULVECTOMY;  Surgeon: Lafonda Mosses, MD;  [facility];  Service: Gynecology;  Laterality: N/A;    Social History Tracy [name]  reports that she quit smoking about 4 years ago. Her smoking use included cigarettes. She has a 65.00 pack-year smoking history. She has never used smokeless tobacco. She reports current alcohol use. She reports that she does not use drugs.  family history  includes Alzheimer's disease in her maternal uncle; Cancer in her mother; Colon cancer (age of onset: 44) in her mother; Depression in her mother; Diabetes in her daughter; Other in an other family member. She was adopted.  Allergies  Allergen Reactions   Erythromycin Rash    But isn't certain   Sulfamethoxazole Rash       PHYSICAL EXAMINATION:  Vital signs: There were no vitals taken for this visit. General: Well-developed, well-nourished, no acute distress HEENT: Sclerae are anicteric, conjunctiva pink. Oral mucosa intact Lungs: Clear Heart: Regular Abdomen:  soft, nontender, nondistended, no obvious ascites, no peritoneal signs, normal bowel sounds. No organomegaly. Extremities: No edema Psychiatric: alert and oriented x3. Cooperative     ASSESSMENT:   1.  Chronic constipation.  Ongoing. 2.  For abdominal pain.  May be secondary to constipation.  May be secondary to healing from recent surgery.  Likely secondary to diverticular disease without diverticulitis. 3.  GERD.  Controlled on PPI.  Prior EGD 2009. 4.  History of sessile serrated adenoma.  2009 colonoscopy.  Overdue for follow-up 5.  Coronary artery disease with remote drug-eluting stent placement January 2019.  On aspirin and Plavix. 6.  General medical problems 7.  Severe diverticulosis as noted 8.  Status post laparoscopic bilateral salpingo-oophorectomy for large pelvic serous cystadenoma's November 10, 2020   PLAN:   1.  Laxative choice for constipation.  I have recommended MiraLAX daily.  Titrate to need. 2.  Reflux precautions 3.  Continue PPI 4.  Schedule surveillance colonoscopy (PEDIATRIC COLONOSCOPE) for history of serrated adenoma.  As well to further evaluate issues with constipation pain.  Patient is HIGH RISK given her comorbidities and the need to address her Plavix therapy. 5.  Would like to hold Plavix 1 week prior to the procedure.  Continue on aspirin throughout.  We have conferred with cardiology who has approved this.  The patient has been informed. 6.  Ongoing general medical care with PCP and other specialists.

## 2021-09-20 NOTE — Progress Notes (Signed)
Report to PACU, RN, vss, BBS= Clear.  

## 2021-09-20 NOTE — Patient Instructions (Addendum)
YOU HAD AN ENDOSCOPIC PROCEDURE TODAY AT [facility]:   Refer to the procedure report that was given to you for any specific questions about what was found during the examination.  If the procedure report does not answer your questions, please call your gastroenterologist to clarify.  If you requested that your care partner not be given the details of your procedure findings, then the procedure report has been included in a sealed envelope for you to review at your convenience later.  YOU SHOULD EXPECT: Some feelings of bloating in the abdomen. Passage of more gas than usual.  Walking can help get rid of the air that was put into your GI tract during the procedure and reduce the bloating. If you had a lower endoscopy (such as a colonoscopy or flexible sigmoidoscopy) you may notice spotting of blood in your stool or on the toilet paper. If you underwent a bowel prep for your procedure, you may not have a normal bowel movement for a few days.  Please Note:  You might notice some irritation and congestion in your nose or some drainage.  This is from the oxygen used during your procedure.  There is no need for concern and it should clear up in a day or so.  SYMPTOMS TO REPORT IMMEDIATELY:  Following lower endoscopy (colonoscopy or flexible sigmoidoscopy):  Excessive amounts of blood in the stool  Significant tenderness or worsening of abdominal pains  Swelling of the abdomen that is new, acute  Fever of 100F or higher   For urgent or emergent issues, a gastroenterologist can be reached at any hour by calling [phone]. Do not use MyChart messaging for urgent concerns.    DIET:  We do recommend a small meal at first, but then you may proceed to your regular diet.  Drink plenty of fluids but you should avoid alcoholic beverages for 24 hours.  ACTIVITY:  You should plan to take it easy for the rest of today and you should NOT DRIVE or use heavy machinery until tomorrow (because  of the sedation medicines used during the test).    FOLLOW UP: Our staff will call the number listed on your records 48-72 hours following your procedure to check on you and address any questions or concerns that you may have regarding the information given to you following your procedure. If we do not reach you, we will leave a message.  We will attempt to reach you two times.  During this call, we will ask if you have developed any symptoms of COVID 19. If you develop any symptoms (ie: fever, flu-like symptoms, shortness of breath, cough etc.) before then, please call [phone].  If you test positive for Covid 19 in the 2 weeks post procedure, please call and report this information to [place].    If any biopsies were taken you will be contacted by phone or by letter within the next 1-3 weeks.  Please call us at [phone] if you have not heard about the biopsies in 3 weeks.    SIGNATURES/CONFIDENTIALITY: You and/or your care partner have signed paperwork which will be entered into your electronic medical record.  These signatures attest to the fact that that the information above on your After Visit Summary has been reviewed and is understood.  Full responsibility of the confidentiality of this discharge information lies with you and/or your care-partner.    Resume plavix and remainder of medications today.information given on polyps ,diverticulosis and hemorrhoids.

## 2021-09-20 NOTE — Progress Notes (Signed)
VS by DT  Pt's states no medical or surgical changes since previsit or office visit.  

## 2021-09-20 NOTE — Progress Notes (Signed)
Verbal order received for patient to start back on plavix today.

## 2021-09-20 NOTE — Progress Notes (Signed)
Called to room to assist during endoscopic procedure.  Patient ID and intended procedure confirmed with present staff. Received instructions for my participation in the procedure from the performing physician.  

## 2021-09-20 NOTE — Op Note (Signed)
[facility] Patient Name: Tracy [name] Procedure Date: 09/20/2021 3:32 PM MRN: [id number] Endoscopist: Docia Chuck. Henrene Pastor , MD Age: 75 Referring MD:  Date of Birth: 1946-03-30 Gender: Female Account #: [id number] Procedure:                Colonoscopy with cold snare polypectomy x 2 Indications:              High risk colon cancer surveillance: Personal                            history of sessile serrated colon polyp (less than                            10 mm in size) with no dysplasia. Previous                            examination 2009. Abdominal discomfort and                            constipation have improved since office visit July                            2022 Medicines:                Monitored Anesthesia Care Procedure:                Pre-Anesthesia Assessment:                           - Prior to the procedure, a History and Physical                            was performed, and patient medications and                            allergies were reviewed. The patient's tolerance of                            previous anesthesia was also reviewed. The risks                            and benefits of the procedure and the sedation                            options and risks were discussed with the patient.                            All questions were answered, and informed consent                            was obtained. Prior Anticoagulants: The patient has                            taken Plavix (clopidogrel), last dose was 14 days  prior to procedure. ASA Grade Assessment: III - A                            patient with severe systemic disease. After                            reviewing the risks and benefits, the patient was                            deemed in satisfactory condition to undergo the                            procedure.                           After obtaining informed consent, the colonoscope                             was passed under direct vision. Throughout the                            procedure, the patient's blood pressure, pulse, and                            oxygen saturations were monitored continuously. The                            0441 PCF-H190TL Slim SB Colonoscope was introduced                            through the anus and advanced to the the cecum,                            identified by appendiceal orifice and ileocecal                            valve. The ileocecal valve, appendiceal orifice,                            and rectum were photographed. The quality of the                            bowel preparation was excellent. The colonoscopy                            was performed without difficulty. The patient                            tolerated the procedure well. The bowel preparation                            used was SUPREP via split dose instruction. Scope In: 3:45:34 PM Scope Out: 3:59:32 PM Scope Withdrawal Time: 0 hours 10 minutes 4 seconds  Total Procedure Duration: 0  hours 13 minutes 58 seconds  Findings:                 Two polyps were found in the transverse colon and                            ascending colon. The polyps were 2 to 3 mm in size.                            These polyps were removed with a cold snare.                            Resection and retrieval were complete.                           Multiple small and large-mouthed diverticula were                            found in the entire colon.                           Internal hemorrhoids were found during                            retroflexion. The hemorrhoids were small.                           The exam was otherwise without abnormality on                            direct and retroflexion views. Complications:            No immediate complications. Estimated blood loss:                            None. Estimated Blood Loss:     Estimated blood loss: none. Impression:                - Two 2 to 3 mm polyps in the transverse colon and                            in the ascending colon, removed with a cold snare.                            Resected and retrieved.                           - Diverticulosis in the entire examined colon.                           - Internal hemorrhoids.                           - The examination was otherwise normal on direct  and retroflexion views. Recommendation:           - Repeat colonoscopy is not recommended for                            surveillance.                           - Resume [Medication] at prior dose. [Management].                           - Patient has a contact number available for                            emergencies. The signs and symptoms of potential                            delayed complications were discussed with the                            patient. Return to normal activities tomorrow.                            Written discharge instructions were provided to the                            patient.                           - Resume previous diet.                           - Continue present medications.                           - Await pathology results. Docia Chuck. Henrene Pastor, MD 09/20/2021 4:05:34 PM This report has been signed electronically.

## 2021-09-21 ENCOUNTER — Encounter: Payer: Medicare Other | Admitting: Rehabilitative and Restorative Service Providers"

## 2021-09-22 ENCOUNTER — Ambulatory Visit: Payer: Medicare Other | Attending: Family Medicine | Admitting: Rehabilitative and Restorative Service Providers"

## 2021-09-22 ENCOUNTER — Encounter: Payer: Self-pay | Admitting: Rehabilitative and Restorative Service Providers"

## 2021-09-22 ENCOUNTER — Telehealth: Payer: Self-pay

## 2021-09-22 ENCOUNTER — Other Ambulatory Visit: Payer: Self-pay

## 2021-09-22 DIAGNOSIS — M6281 Muscle weakness (generalized): Secondary | ICD-10-CM

## 2021-09-22 DIAGNOSIS — R2681 Unsteadiness on feet: Secondary | ICD-10-CM

## 2021-09-22 DIAGNOSIS — R296 Repeated falls: Secondary | ICD-10-CM

## 2021-09-22 NOTE — Therapy (Signed)
[facility] @ [address] [place], [place], [address] Phone: [phone]   Fax:  [phone]  Physical Therapy Treatment and Reassessment  Patient Details  Name: Tracy [name] MRN: [id number] Date of Birth: 12-04-1945 Referring Provider (PT): [name], MD   Encounter Date: 09/22/2021  Progress Note Reporting Period 07/14/2021 to 09/22/2021  See note below for Objective Data and Assessment of Progress/Goals.       PT End of Session - 09/22/21 0958     Visit Number 12    Date for PT Re-Evaluation 10/08/21    Authorization Type Medicare    Authorization Time Period 09/14/21-10/08/2021    Progress Note Due on Visit 30    PT Start Time 0930    PT Stop Time 1010    PT Time Calculation (min) 40 min    Activity Tolerance Patient tolerated treatment well    Behavior During Therapy [facility] for tasks assessed/performed             Past Medical History:  Diagnosis Date   Allergic rhinitis    Allergy    Anxiety    Arthritis    Bronchitis    CAD (coronary artery disease)    1/19 PCI/DES to pLCX for ISR, normal EF.    Cancer ([place])    hx of precancerous cells in right breast    Cataract    Cervical dysplasia    unsure of procedure, possible "burning" in her late 47s   CHF (congestive heart failure) ([facility])    Echo 06/2019: EF 55-60, elevated LVEDP, normal RV SF, mild MAC, mild MR, trivial TR   Complication of anesthesia    COPD (chronic obstructive pulmonary disease) ([facility])    early    Depression    Dyspnea    with exertion    Family history of adverse reaction to anesthesia    daughter- problems wiht n/v   GERD (gastroesophageal reflux disease)    Gout    Headache(784.0)    Heart murmur    hx of years ago    Hyperlipidemia    Hypertension    Low back pain    Menopausal syndrome    Myocardial infarct ([place]) 2007   hx of   Overactive bladder    PONV (postoperative nausea and vomiting)    Sleep apnea      Past Surgical History:  Procedure Laterality Date   ANGIOPLASTY     stent 2007   BREAST LUMPECTOMY WITH RADIOACTIVE SEED LOCALIZATION Right 04/28/2020   Procedure: RIGHT BREAST LUMPECTOMY X 2  WITH RADIOACTIVE SEED LOCALIZATION;  Surgeon: Donnie Mesa, MD;  Location: [facility];  Service: General;  Laterality: Right;  LMA   CARDIAC CATHETERIZATION     COLONOSCOPY     CORONARY STENT INTERVENTION N/A 11/06/2017   Procedure: CORONARY STENT INTERVENTION;  Surgeon: [name], MD;  Location: [facility];  Service: Cardiovascular;  Laterality: N/A;   LEFT HEART CATH AND CORONARY ANGIOGRAPHY N/A 11/06/2017   Procedure: LEFT HEART CATH AND CORONARY ANGIOGRAPHY;  Surgeon: [name], MD;  Location: [facility];  Service: Cardiovascular;  Laterality: N/A;   LEFT HEART CATH AND CORONARY ANGIOGRAPHY N/A 07/24/2019   Procedure: LEFT HEART CATH AND CORONARY ANGIOGRAPHY;  Surgeon: Burnell Blanks, MD;  Location: [facility];  Service: Cardiovascular;  Laterality: N/A;   RIGHT/LEFT HEART CATH AND CORONARY ANGIOGRAPHY N/A 09/29/2020   Procedure: RIGHT/LEFT HEART CATH AND CORONARY ANGIOGRAPHY;  Surgeon: Belva Crome, MD;  Location: [facility];  Service: Cardiovascular;  Laterality: N/A;   ROBOTIC ASSISTED BILATERAL SALPINGO OOPHERECTOMY Bilateral 11/10/2020   Procedure: XI ROBOTIC ASSISTED BILATERAL SALPINGO OOPHORECTOMY WITH MINI LAPAROTOMY FOR DRAINAGE;  Surgeon: Lafonda Mosses, MD;  Location: [facility];  Service: Gynecology;  Laterality: Bilateral;  MINI LAP FIRST   TONSILLECTOMY     VULVECTOMY N/A 11/10/2020   Procedure: WIDE EXCISION VULVECTOMY;  Surgeon: Lafonda Mosses, MD;  Location: [facility];  Service: Gynecology;  Laterality: N/A;    There were no vitals filed for this visit.   Subjective Assessment - 09/22/21 0956     Subjective Pt reports having some brain fogginess still from colonscopy on Monday.    Patient Stated Goals  be able to walk without using her cane    Currently in Pain? Yes    Pain Score 5     Pain Location Knee    Pain Orientation Right;Left    Pain Descriptors / Indicators Aching    Pain Type Chronic pain                [facility] PT Assessment - 09/22/21 0001       Assessment   Medical Diagnosis unstable gait    Referring Provider (PT) [name], MD      Precautions   Precautions None      Balance Screen   Has the patient fallen in the past 6 months Yes    How many times? 2    Has the patient had a decrease in activity level because of a fear of falling?  No    Is the patient reluctant to leave their home because of a fear of falling?  Yes      [place] Private residence    Living Arrangements Spouse/significant other    Type of [place] Two level;Bed/bath upstairs      Prior Function   Level of Independence Independent    Vocation Retired    Therapist, sports Comments B hip strength grossly 4-/5, B knee strength grossly 4+/5    Right Hip Flexion 4-/5    Left Hip Flexion 4-/5      Transfers   Five time sit to stand comments  17.2 with limited use of BUE at end as pt fatigued.                           [facility] Adult PT Treatment/Exercise - 09/22/21 0001       Lumbar Exercises: Aerobic   Nustep L5 x6' with PT present to discuss pt status   382     Knee/Hip Exercises: Seated   Long Arc Quad Strengthening;Both;2 sets;10 reps    Long Arc Quad Weight 3 lbs.      Manual Therapy   Manual Therapy Soft tissue mobilization    Manual therapy comments using Addaday    Soft tissue mobilization To B quad, L piriformis, and IT band                       PT Short Term Goals - 08/02/21 1014       PT SHORT TERM GOAL #1   Title Pt will be independent with her initial HEP to improve LE strength and balance.    Status Achieved  PT SHORT TERM GOAL #2   Title Pt  will consistently use her SPC at the new adjusted height to improve her mechanics and safety with ambulation.    Status Achieved               PT Long Term Goals - 09/22/21 1010       PT LONG TERM GOAL #1   Title Pt will complete the 5x sit to stand in less than 14 sec without the need for UE support to reflect improvements in LE strength.    Status Partially Met      PT LONG TERM GOAL #2   Title Pt will complete TUG in less than 14 sec with LRAD to reflect improvements in her balance.    Status Achieved      PT LONG TERM GOAL #3   Title Pt will score atleast 6 point increase in her BERG score to reflect a significant improvement in her balance and decreas risk of falling.    Status On-going      PT LONG TERM GOAL #4   Title Pt will have atleast 4/5 MMT strength in hip abductors to help with single leg stability.    Status Partially Met                   Plan - 09/22/21 1002     Clinical Impression Statement Pt reports that she has made approx 60% improvement since starting PT. Pt is progressing with improved strength and ability to perform sit to/from stand with less reliance on UE pushing up from chair. Pt continues to have B knee pain and continues to use her SPC with ambulation, especially outside. Pt has not yet met all goals and would benefit from continued PT to further allow her to reach her goal of ambulating without assistive device and be more independent with activities and decrease her risk of falling.    Stability/Clinical Decision Making Stable/Uncomplicated    Clinical Decision Making Low    Rehab Potential Good    PT Frequency 2x / week    PT Duration 4 weeks    PT Treatment/Interventions ADLs/Self Care Home Management;Gait training;Stair training;Functional mobility training;Therapeutic activities;Therapeutic exercise;Neuromuscular re-education;Balance training;Patient/family education;Manual techniques    PT Next Visit Plan Balance activities,  functional strength. LE strength and endurance.    Consulted and Agree with Plan of Care Patient             Patient will benefit from skilled therapeutic intervention in order to improve the following deficits and impairments:  Decreased balance, Decreased endurance, Difficulty walking, Decreased coordination, Decreased safety awareness, Decreased strength  Visit Diagnosis: Unsteadiness on feet - Plan: PT plan of care cert/re-cert  Repeated falls - Plan: PT plan of care cert/re-cert  Muscle weakness (generalized) - Plan: PT plan of care cert/re-cert     Problem List Patient Active Problem List   Diagnosis Date Noted   Tremor of both hands 08/16/2021   Aortic atherosclerosis ([place]) 06/15/2021   Generalized osteoarthritis of multiple sites 12/25/2020   Chronic pain disorder 12/25/2020   Pelvic mass in female    Bilateral lower extremity edema 04/13/2020   Shortness of breath    Anxiety disorder 10/29/2018   CKD (chronic kidney disease), stage III ([place] [facility]) 06/27/2018   COPD (chronic obstructive pulmonary disease) ([place]) 05/25/2018   Obesity 03/20/2018   Dyspnea on exertion 11/06/2017   Abnormal stress test 11/06/2017   Chronic diastolic CHF (congestive heart failure) ([place]) 10/03/2017  Chronic combined systolic and diastolic heart failure ([place]) 09/04/2017   Left ventricular dysfunction 07/31/2017   Cataract, nuclear sclerotic senile, bilateral 02/01/2017   Gouty arthritis of toe of left foot 08/09/2016   Hyperuricemia 08/09/2016   Osteoarthritis (arthritis due to wear and tear of joints) 01/14/2014   Coronary artery disease 08/02/2011   Mitral regurgitation 08/02/2011   OVERACTIVE BLADDER 07/02/2010   ACUTE CYSTITIS 05/25/2010   Pure hypercholesterolemia 08/11/2009   PEDAL EDEMA 07/22/2009   GERD 04/11/2008   Essential hypertension 05/30/2007   MYOCARDIAL INFARCTION, HX OF 05/30/2007   Allergic rhinitis 05/30/2007   LOW BACK PAIN 05/30/2007    Juel Burrow, PT,  DPT 09/22/2021, 10:16 AM  [facility] @ [address] [place], [place], [address] Phone: [phone]   Fax:  [phone]  Name: Tracy [name] MRN: [id number] Date of Birth: [birth year]

## 2021-09-22 NOTE — Telephone Encounter (Signed)
  Follow up Call-  Call back number 09/20/2021  Post procedure Call Back phone  # [phone]  Permission to leave phone message Yes  Some recent data might be hidden     Patient questions:  Do you have a fever, pain , or abdominal swelling? No. Pain Score  0 *  Have you tolerated food without any problems? Yes.    Have you been able to return to your normal activities? Yes.    Do you have any questions about your discharge instructions: Diet   No. Medications  No. Follow up visit  No.  Do you have questions or concerns about your Care? No.  Actions: * If pain score is 4 or above: No action needed, pain <4.

## 2021-09-23 ENCOUNTER — Other Ambulatory Visit: Payer: Self-pay | Admitting: Physician Assistant

## 2021-09-23 ENCOUNTER — Encounter: Payer: Medicare Other | Admitting: Rehabilitative and Restorative Service Providers"

## 2021-09-24 ENCOUNTER — Encounter: Payer: Self-pay | Admitting: Internal Medicine

## 2021-09-28 ENCOUNTER — Other Ambulatory Visit: Payer: Self-pay

## 2021-09-28 ENCOUNTER — Encounter: Payer: Self-pay | Admitting: Rehabilitative and Restorative Service Providers"

## 2021-09-28 ENCOUNTER — Ambulatory Visit: Payer: Medicare Other | Admitting: Rehabilitative and Restorative Service Providers"

## 2021-09-28 DIAGNOSIS — R296 Repeated falls: Secondary | ICD-10-CM | POA: Diagnosis not present

## 2021-09-28 DIAGNOSIS — M6281 Muscle weakness (generalized): Secondary | ICD-10-CM | POA: Diagnosis not present

## 2021-09-28 DIAGNOSIS — R2681 Unsteadiness on feet: Secondary | ICD-10-CM | POA: Diagnosis not present

## 2021-09-28 NOTE — Therapy (Signed)
[facility] @ [facility] [place], [place], [address] Phone: [phone]   Fax:  [phone]  Physical Therapy Treatment  Patient Details  Name: Tracy [name] MRN: [id number] Date of Birth: [birth year] Referring Provider (PT): [name], MD   Encounter Date: 09/28/2021   PT End of Session - 09/28/21 0936     Visit Number 13    Date for PT Re-Evaluation 10/08/21    Authorization Type Medicare    Authorization Time Period 09/14/21-10/08/2021    Progress Note Due on Visit 20    PT Start Time 0930    PT Stop Time 1010    PT Time Calculation (min) 40 min    Activity Tolerance Patient tolerated treatment well    Behavior During Therapy [facility] for tasks assessed/performed             Past Medical History:  Diagnosis Date   Allergic rhinitis    Allergy    Anxiety    Arthritis    Bronchitis    CAD (coronary artery disease)    1/19 PCI/DES to [place] for ISR, normal EF.    Cancer ([place])    hx of precancerous cells in right breast    Cataract    Cervical dysplasia    unsure of procedure, possible "burning" in her late 39s   CHF (congestive heart failure) ([place])    Echo 06/2019: EF 55-60, elevated LVEDP, normal RV SF, mild MAC, mild MR, trivial TR   Complication of anesthesia    COPD (chronic obstructive pulmonary disease) (HCC)    early    Depression    Dyspnea    with exertion    Family history of adverse reaction to anesthesia    daughter- problems wiht n/v   GERD (gastroesophageal reflux disease)    Gout    Headache(784.0)    Heart murmur    hx of years ago    Hyperlipidemia    Hypertension    Low back pain    Menopausal syndrome    Myocardial infarct ([place]) 2007   hx of   Overactive bladder    PONV (postoperative nausea and vomiting)    Sleep apnea     Past Surgical History:  Procedure Laterality Date   ANGIOPLASTY     stent 2007   BREAST LUMPECTOMY WITH RADIOACTIVE SEED LOCALIZATION Right  04/28/2020   Procedure: RIGHT BREAST LUMPECTOMY X 2  WITH RADIOACTIVE SEED LOCALIZATION;  Surgeon: Donnie Mesa, MD;  Location: [facility] [facility];  Service: General;  Laterality: Right;  LMA   CARDIAC CATHETERIZATION     COLONOSCOPY     CORONARY STENT INTERVENTION N/A 11/06/2017   Procedure: CORONARY STENT INTERVENTION;  Surgeon: [name], MD;  Location: [place] [facility];  Service: Cardiovascular;  Laterality: N/A;   LEFT HEART CATH AND CORONARY ANGIOGRAPHY N/A 11/06/2017   Procedure: LEFT HEART CATH AND CORONARY ANGIOGRAPHY;  Surgeon: [name], MD;  Location: [place] [facility];  Service: Cardiovascular;  Laterality: N/A;   LEFT HEART CATH AND CORONARY ANGIOGRAPHY N/A 07/24/2019   Procedure: LEFT HEART CATH AND CORONARY ANGIOGRAPHY;  Surgeon: Burnell Blanks, MD;  Location: [place] [facility];  Service: Cardiovascular;  Laterality: N/A;   RIGHT/LEFT HEART CATH AND CORONARY ANGIOGRAPHY N/A 09/29/2020   Procedure: RIGHT/LEFT HEART CATH AND CORONARY ANGIOGRAPHY;  Surgeon: Belva Crome, MD;  Location: [place] [facility];  Service: Cardiovascular;  Laterality: N/A;   ROBOTIC ASSISTED BILATERAL SALPINGO OOPHERECTOMY Bilateral  11/10/2020   Procedure: XI ROBOTIC ASSISTED BILATERAL SALPINGO OOPHORECTOMY WITH MINI LAPAROTOMY FOR DRAINAGE;  Surgeon: Lafonda Mosses, MD;  [facility];  Service: Gynecology;  Laterality: Bilateral;  MINI LAP FIRST   TONSILLECTOMY     VULVECTOMY N/A 11/10/2020   Procedure: WIDE EXCISION VULVECTOMY;  Surgeon: Lafonda Mosses, MD;  [facility];  Service: Gynecology;  Laterality: N/A;    There were no vitals filed for this visit.   Subjective Assessment - 09/28/21 0935     Subjective Pt reports no new complaints.    Patient Stated Goals be able to walk without using her cane    Currently in Pain? Yes    Pain Score 3     Pain Location Knee    Pain Orientation Left;Right    Pain Descriptors / Indicators Aching    Pain Type  Chronic pain                               [facility] Adult PT Treatment/Exercise - 09/28/21 0001       High Level Balance   High Level Balance Comments Standing on blue foam performing ball toss.      Lumbar Exercises: Aerobic   Nustep L5 x6' with PT present to discuss pt status   353 steps     Knee/Hip Exercises: Standing   Hip Flexion Stengthening;Both;1 set;10 reps    Hip Abduction Stengthening;Both;1 set;10 reps    Hip Extension Stengthening;Both;1 set;10 reps;Knee straight    Other Standing Knee Exercises Alt LE toe taps on 6" step without UE support 2x10 each      Knee/Hip Exercises: Seated   Long Arc Quad Strengthening;Both;2 sets;10 reps    Long Arc Quad Weight 3 lbs.    Clamshell with TheraBand Red   2x10   Marching Both;2 sets;10 reps    Marching Limitations 3    Hamstring Curl Strengthening;Both;2 sets;10 reps    Hamstring Limitations red tband    Abduction/Adduction  Strengthening;Both;2 sets;10 reps    Abd/Adduction Limitations 3#, hip abduction scissors    Sit to Sand 2 sets;5 reps;with UE support   standing on blue foam                      PT Short Term Goals - 08/02/21 1014       PT SHORT TERM GOAL #1   Title Pt will be independent with her initial HEP to improve LE strength and balance.    Status Achieved      PT SHORT TERM GOAL #2   Title Pt will consistently use her SPC at the new adjusted height to improve her mechanics and safety with ambulation.    Status Achieved               PT Long Term Goals - 09/22/21 1010       PT LONG TERM GOAL #1   Title Pt will complete the 5x sit to stand in less than 14 sec without the need for UE support to reflect improvements in LE strength.    Status Partially Met      PT LONG TERM GOAL #2   Title Pt will complete TUG in less than 14 sec with LRAD to reflect improvements in her balance.    Status Achieved      PT LONG TERM GOAL #3   Title Pt will score atleast 6 point  increase  in her BERG score to reflect a significant improvement in her balance and decreas risk of falling.    Status On-going      PT LONG TERM GOAL #4   Title Pt will have atleast 4/5 MMT strength in hip abductors to help with single leg stability.    Status Partially Met                   Plan - 09/28/21 1010     Clinical Impression Statement Ms Minier continues to progress with goal related activities.  She will be reevaluated next session for continued visits in a new plan of care, as she has not yet met her goals. Pt with some unsteadiness noted with standing on blue foam and with difficulty with sit to/from stand on blue foam. Pt continues with B knee pain, but less pain noted than last session.    PT Treatment/Interventions ADLs/Self Care Home Management;Gait training;Stair training;Functional mobility training;Therapeutic activities;Therapeutic exercise;Neuromuscular re-education;Balance training;Patient/family education;Manual techniques    PT Next Visit Plan Balance activities, functional strength. LE strength and endurance.    Consulted and Agree with Plan of Care Patient             Patient will benefit from skilled therapeutic intervention in order to improve the following deficits and impairments:  Decreased balance, Decreased endurance, Difficulty walking, Decreased coordination, Decreased safety awareness, Decreased strength  Visit Diagnosis: Unsteadiness on feet  Repeated falls  Muscle weakness (generalized)     Problem List Patient Active Problem List   Diagnosis Date Noted   Tremor of both hands 08/16/2021   Aortic atherosclerosis ([place]) 06/15/2021   Generalized osteoarthritis of multiple sites 12/25/2020   Chronic pain disorder 12/25/2020   Pelvic mass in female    Bilateral lower extremity edema 04/13/2020   Shortness of breath    Anxiety disorder 10/29/2018   CKD (chronic kidney disease), stage III ([place]) 06/27/2018   COPD (chronic  obstructive pulmonary disease) ([place]) 05/25/2018   Obesity 03/20/2018   Dyspnea on exertion 11/06/2017   Abnormal stress test 11/06/2017   Chronic diastolic CHF (congestive heart failure) ([place]) 10/03/2017   Chronic combined systolic and diastolic heart failure ([place]) 09/04/2017   Left ventricular dysfunction 07/31/2017   Cataract, nuclear sclerotic senile, bilateral 02/01/2017   Gouty arthritis of toe of left foot 08/09/2016   Hyperuricemia 08/09/2016   Osteoarthritis (arthritis due to wear and tear of joints) 01/14/2014   Coronary artery disease 08/02/2011   Mitral regurgitation 08/02/2011   OVERACTIVE BLADDER 07/02/2010   ACUTE CYSTITIS 05/25/2010   Pure hypercholesterolemia 08/11/2009   PEDAL EDEMA 07/22/2009   GERD 04/11/2008   Essential hypertension 05/30/2007   MYOCARDIAL INFARCTION, HX OF 05/30/2007   Allergic rhinitis 05/30/2007   LOW BACK PAIN 05/30/2007    Juel Burrow, PT, DPT 09/28/2021, 10:13 AM  [place] @ [facility] [place], [place], [address] Phone: [phone]   Fax:  [phone]  Name: Tracy [name] MRN: [id number] Date of Birth: May 24, 1946

## 2021-09-30 ENCOUNTER — Encounter: Payer: Self-pay | Admitting: Rehabilitative and Restorative Service Providers"

## 2021-09-30 ENCOUNTER — Ambulatory Visit: Payer: Medicare Other | Admitting: Rehabilitative and Restorative Service Providers"

## 2021-09-30 ENCOUNTER — Other Ambulatory Visit: Payer: Self-pay

## 2021-09-30 DIAGNOSIS — R2681 Unsteadiness on feet: Secondary | ICD-10-CM

## 2021-09-30 DIAGNOSIS — M6281 Muscle weakness (generalized): Secondary | ICD-10-CM

## 2021-09-30 DIAGNOSIS — R296 Repeated falls: Secondary | ICD-10-CM

## 2021-09-30 NOTE — Therapy (Signed)
[facility] @ [place], [place], [address] Phone: [phone]   Fax:  [phone]  Physical Therapy Treatment  Patient Details  Name: Tracy [name] MRN: [id number] Date of Birth: Dec 29, 1945 Referring Provider (PT): [name], MD   Encounter Date: 09/30/2021   PT End of Session - 09/30/21 0936     Visit Number 14    Date for PT Re-Evaluation 11/05/21    Authorization Type Medicare    Authorization Time Period 10/10/2021-11/05/21    Progress Note Due on Visit 20    PT Start Time 0930    PT Stop Time 1010    PT Time Calculation (min) 40 min    Activity Tolerance Patient tolerated treatment well    Behavior During Therapy [facility] for tasks assessed/performed             Past Medical History:  Diagnosis Date   Allergic rhinitis    Allergy    Anxiety    Arthritis    Bronchitis    CAD (coronary artery disease)    1/19 PCI/DES to [place] for ISR, normal EF.    Cancer ([place])    hx of precancerous cells in right breast    Cataract    Cervical dysplasia    unsure of procedure, possible "burning" in her late 52s   CHF (congestive heart failure) ([place])    Echo 06/2019: EF 55-60, elevated LVEDP, normal RV SF, mild MAC, mild MR, trivial TR   Complication of anesthesia    COPD (chronic obstructive pulmonary disease) (HCC)    early    Depression    Dyspnea    with exertion    Family history of adverse reaction to anesthesia    daughter- problems wiht n/v   GERD (gastroesophageal reflux disease)    Gout    Headache(784.0)    Heart murmur    hx of years ago    Hyperlipidemia    Hypertension    Low back pain    Menopausal syndrome    Myocardial infarct ([place]) 2007   hx of   Overactive bladder    PONV (postoperative nausea and vomiting)    Sleep apnea     Past Surgical History:  Procedure Laterality Date   ANGIOPLASTY     stent 2007   BREAST LUMPECTOMY WITH RADIOACTIVE SEED LOCALIZATION Right  04/28/2020   Procedure: RIGHT BREAST LUMPECTOMY X 2  WITH RADIOACTIVE SEED LOCALIZATION;  Surgeon: Donnie Mesa, MD;  Location: [facility];  Service: General;  Laterality: Right;  LMA   CARDIAC CATHETERIZATION     COLONOSCOPY     CORONARY STENT INTERVENTION N/A 11/06/2017   Procedure: CORONARY STENT INTERVENTION;  Surgeon: [name], MD;  Location: [place] [facility];  Service: Cardiovascular;  Laterality: N/A;   LEFT HEART CATH AND CORONARY ANGIOGRAPHY N/A 11/06/2017   Procedure: LEFT HEART CATH AND CORONARY ANGIOGRAPHY;  Surgeon: [name], MD;  Location: [place] [facility];  Service: Cardiovascular;  Laterality: N/A;   LEFT HEART CATH AND CORONARY ANGIOGRAPHY N/A 07/24/2019   Procedure: LEFT HEART CATH AND CORONARY ANGIOGRAPHY;  Surgeon: Burnell Blanks, MD;  Location: [place] [facility];  Service: Cardiovascular;  Laterality: N/A;   RIGHT/LEFT HEART CATH AND CORONARY ANGIOGRAPHY N/A 09/29/2020   Procedure: RIGHT/LEFT HEART CATH AND CORONARY ANGIOGRAPHY;  Surgeon: Belva Crome, MD;  Location: [facility];  Service: Cardiovascular;  Laterality: N/A;   ROBOTIC ASSISTED BILATERAL SALPINGO OOPHERECTOMY Bilateral  11/10/2020   Procedure: XI ROBOTIC ASSISTED BILATERAL SALPINGO OOPHORECTOMY WITH MINI LAPAROTOMY FOR DRAINAGE;  Surgeon: Lafonda Mosses, MD;  [facility];  Service: Gynecology;  Laterality: Bilateral;  MINI LAP FIRST   TONSILLECTOMY     VULVECTOMY N/A 11/10/2020   Procedure: WIDE EXCISION VULVECTOMY;  Surgeon: Lafonda Mosses, MD;  [facility];  Service: Gynecology;  Laterality: N/A;    There were no vitals filed for this visit.   Subjective Assessment - 09/30/21 0935     Subjective My knees and hips are bothering me today.    Patient Stated Goals be able to walk without using her cane    Currently in Pain? Yes    Pain Score 4     Pain Location Knee    Pain Orientation Right;Left    Pain Descriptors / Indicators Aching     Pain Type Chronic pain                [facility] PT Assessment - 09/30/21 0001       Assessment   Medical Diagnosis unstable gait    Referring Provider (PT) [name], MD      Precautions   Precautions None      Balance Screen   Has the patient fallen in the past 6 months Yes    How many times? 2    Has the patient had a decrease in activity level because of a fear of falling?  Yes    Is the patient reluctant to leave their home because of a fear of falling?  Yes      [place] residence    Living Arrangements Spouse/significant other      Prior Function   Level of [place] Retired    Leisure Doctor, hospital Comments B hip strength grossly 4-/5, B knee strength grossly 4+/5      Transfers   Five time sit to stand comments  19.1 sec without UE use      Berg Balance Test   Sit to Stand Able to stand without using hands and stabilize independently    Standing Unsupported Able to stand safely 2 minutes    Sitting with Back Unsupported but Feet Supported on Floor or Stool Able to sit safely and securely 2 minutes    Stand to Sit Sits safely with minimal use of hands    Transfers Able to transfer safely, minor use of hands    Standing Unsupported with Eyes Closed Able to stand 10 seconds safely    Standing Unsupported with Feet Together Able to place feet together independently and stand for 1 minute with supervision    From Standing, Reach Forward with Outstretched Arm Can reach forward >12 cm safely (5")    From Standing Position, Pick up Object from [place] to pick up shoe safely and easily    From Standing Position, Turn to Look Behind Over each Shoulder Looks behind one side only/other side shows less weight shift    Turn 360 Degrees Able to turn 360 degrees safely one side only in 4 seconds or less    Standing Unsupported, Alternately Place Feet on Step/Stool Able to stand  independently and complete 8 steps >20 seconds    Standing Unsupported, One Foot in Front Able to take small step independently and hold 30 seconds    Standing on One Leg Tries to lift  leg/unable to hold 3 seconds but remains standing independently    Total Score 46                           [facility] Adult PT Treatment/Exercise - 09/30/21 0001       Ambulation/Gait   Gait Comments Pt amb large loop around both PT gyms without assistive device with good safety.      Lumbar Exercises: Aerobic   Nustep L5 x6' with PT present to discuss pt status      Knee/Hip Exercises: Standing   Other Standing Knee Exercises Alt LE toe taps on 6" step without UE support 2x10 each      Knee/Hip Exercises: Seated   Long Arc Quad Strengthening;Both;2 sets;10 reps    Long Arc Quad Weight 3 lbs.    Marching Both;2 sets;10 reps    Marching Limitations 3      Manual Therapy   Manual Therapy Soft tissue mobilization    Manual therapy comments using [employer]    Soft tissue mobilization To B quad, L piriformis, and IT band                       PT Short Term Goals - 08/02/21 1014       PT SHORT TERM GOAL #1   Title Pt will be independent with her initial HEP to improve LE strength and balance.    Status Achieved      PT SHORT TERM GOAL #2   Title Pt will consistently use her SPC at the new adjusted height to improve her mechanics and safety with ambulation.    Status Achieved               PT Long Term Goals - 09/30/21 0955       PT LONG TERM GOAL #1   Title Pt will complete the 5x sit to stand in less than 14 sec without the need for UE support to reflect improvements in LE strength.    Status Partially Met      PT LONG TERM GOAL #2   Title Pt will complete TUG in less than 14 sec with LRAD to reflect improvements in her balance.    Status Achieved      PT LONG TERM GOAL #3   Title Pt to have a BERG of at least 49/56 to place her at a lower risk of falling.     Status Partially Met   46/56     PT LONG TERM GOAL #4   Title Pt will have atleast 4/5 MMT strength in hip abductors to help with single leg stability.    Status Partially Met                   Plan - 09/30/21 0957     Clinical Impression Statement Tracy [name] is continuing to make great progress with goal related activities, but she has not yet met all goals and would benefit from continued skilled PT for another month. Pt has improved BERG to 46/56 and was able to perform sit to/from stand without UE assistance today from PT mat. She continues to require skilled intervention to progress balance and strength and activity tolerance and address her B knee pain.    Stability/Clinical Decision Making Stable/Uncomplicated    Clinical Decision Making Low    Rehab Potential Good    PT Frequency 2x / week    PT  Duration 4 weeks    PT Treatment/Interventions ADLs/Self Care Home Management;Gait training;Stair training;Functional mobility training;Therapeutic activities;Therapeutic exercise;Neuromuscular re-education;Balance training;Patient/family education;Manual techniques;Passive range of motion;Dry needling;Taping;Joint Manipulations;Cryotherapy;Electrical Stimulation;Iontophoresis 64m/ml Dexamethasone;Moist Heat    PT Next Visit Plan Balance activities, functional strength. LE strength and endurance.    Consulted and Agree with Plan of Care Patient             Patient will benefit from skilled therapeutic intervention in order to improve the following deficits and impairments:  Decreased balance, Decreased endurance, Difficulty walking, Decreased coordination, Decreased safety awareness, Decreased strength  Visit Diagnosis: Unsteadiness on feet - Plan: PT plan of care cert/re-cert  Repeated falls - Plan: PT plan of care cert/re-cert  Muscle weakness (generalized) - Plan: PT plan of care cert/re-cert     Problem List Patient Active Problem List   Diagnosis Date Noted    Tremor of both hands 08/16/2021   Aortic atherosclerosis ([place] 06/15/2021   Generalized osteoarthritis of multiple sites 12/25/2020   Chronic pain disorder 12/25/2020   Pelvic mass in female    Bilateral lower extremity edema 04/13/2020   Shortness of breath    Anxiety disorder 10/29/2018   CKD (chronic kidney disease), stage III ([place] 06/27/2018   COPD (chronic obstructive pulmonary disease) ([place] 05/25/2018   Obesity 03/20/2018   Dyspnea on exertion 11/06/2017   Abnormal stress test 11/06/2017   Chronic diastolic CHF (congestive heart failure) ([place] 10/03/2017   Chronic combined systolic and diastolic heart failure ([place] 09/04/2017   Left ventricular dysfunction 07/31/2017   Cataract, nuclear sclerotic senile, bilateral 02/01/2017   Gouty arthritis of toe of left foot 08/09/2016   Hyperuricemia 08/09/2016   Osteoarthritis (arthritis due to wear and tear of joints) 01/14/2014   Coronary artery disease 08/02/2011   Mitral regurgitation 08/02/2011   OVERACTIVE BLADDER 07/02/2010   ACUTE CYSTITIS 05/25/2010   Pure hypercholesterolemia 08/11/2009   PEDAL EDEMA 07/22/2009   GERD 04/11/2008   Essential hypertension 05/30/2007   MYOCARDIAL INFARCTION, HX OF 05/30/2007   Allergic rhinitis 05/30/2007   LOW BACK PAIN 05/30/2007    SJuel [name] PT, DPT 09/30/2021, 10:31 AM  [place] [place] [address]Phone: [phone]  Fax:  [phone] Name: BCARTHA ROTERTMRN: [id number] of Birth: 6Apr 12, 1947

## 2021-10-12 ENCOUNTER — Ambulatory Visit: Payer: Medicare Other | Admitting: Rehabilitative and Restorative Service Providers"

## 2021-10-12 ENCOUNTER — Encounter: Payer: Self-pay | Admitting: Rehabilitative and Restorative Service Providers"

## 2021-10-12 ENCOUNTER — Other Ambulatory Visit: Payer: Self-pay

## 2021-10-12 DIAGNOSIS — R2681 Unsteadiness on feet: Secondary | ICD-10-CM | POA: Diagnosis not present

## 2021-10-12 DIAGNOSIS — R296 Repeated falls: Secondary | ICD-10-CM

## 2021-10-12 DIAGNOSIS — M6281 Muscle weakness (generalized): Secondary | ICD-10-CM

## 2021-10-12 NOTE — Therapy (Signed)
[facility] @ [place] [place], [place], [address] Phone: [phone]   Fax:  [phone]  Physical Therapy Treatment  Patient Details  Name: Tracy [name] MRN: [id number] Date of Birth: [birth year] Referring Provider (PT): [name], MD   Encounter Date: 10/12/2021   PT End of Session - 10/12/21 1020     Visit Number 15    Date for PT Re-Evaluation 11/05/21    Authorization Type Medicare    Authorization Time Period 10/10/2021-11/05/21    Progress Note Due on Visit 35    PT Start Time 1015    PT Stop Time 1055    PT Time Calculation (min) 40 min    Activity Tolerance Patient tolerated treatment well    Behavior During Therapy [facility] for tasks assessed/performed             Past Medical History:  Diagnosis Date   Allergic rhinitis    Allergy    Anxiety    Arthritis    Bronchitis    CAD (coronary artery disease)    1/19 PCI/DES to [place] for ISR, normal EF.    Cancer ([place])    hx of precancerous cells in right breast    Cataract    Cervical dysplasia    unsure of procedure, possible "burning" in her late 56s   CHF (congestive heart failure) ([place])    Echo 06/2019: EF 55-60, elevated LVEDP, normal RV SF, mild MAC, mild MR, trivial TR   Complication of anesthesia    COPD (chronic obstructive pulmonary disease) (HCC)    early    Depression    Dyspnea    with exertion    Family history of adverse reaction to anesthesia    daughter- problems wiht n/v   GERD (gastroesophageal reflux disease)    Gout    Headache(784.0)    Heart murmur    hx of years ago    Hyperlipidemia    Hypertension    Low back pain    Menopausal syndrome    Myocardial infarct ([place]) 2007   hx of   Overactive bladder    PONV (postoperative nausea and vomiting)    Sleep apnea     Past Surgical History:  Procedure Laterality Date   ANGIOPLASTY     stent 2007   BREAST LUMPECTOMY WITH RADIOACTIVE SEED LOCALIZATION Right  04/28/2020   Procedure: RIGHT BREAST LUMPECTOMY X 2  WITH RADIOACTIVE SEED LOCALIZATION;  Surgeon: Donnie Mesa, MD;  Location: [facility];  Service: General;  Laterality: Right;  LMA   CARDIAC CATHETERIZATION     COLONOSCOPY     CORONARY STENT INTERVENTION N/A 11/06/2017   Procedure: CORONARY STENT INTERVENTION;  Surgeon: [name], MD;  Location: [place] [facility];  Service: Cardiovascular;  Laterality: N/A;   LEFT HEART CATH AND CORONARY ANGIOGRAPHY N/A 11/06/2017   Procedure: LEFT HEART CATH AND CORONARY ANGIOGRAPHY;  Surgeon: [name], MD;  Location: [place] [facility];  Service: Cardiovascular;  Laterality: N/A;   LEFT HEART CATH AND CORONARY ANGIOGRAPHY N/A 07/24/2019   Procedure: LEFT HEART CATH AND CORONARY ANGIOGRAPHY;  Surgeon: Burnell Blanks, MD;  Location: [place] [facility];  Service: Cardiovascular;  Laterality: N/A;   RIGHT/LEFT HEART CATH AND CORONARY ANGIOGRAPHY N/A 09/29/2020   Procedure: RIGHT/LEFT HEART CATH AND CORONARY ANGIOGRAPHY;  Surgeon: Belva Crome, MD;  Location: [place] [facility];  Service: Cardiovascular;  Laterality: N/A;   ROBOTIC ASSISTED BILATERAL SALPINGO OOPHERECTOMY Bilateral  11/10/2020   Procedure: XI ROBOTIC ASSISTED BILATERAL SALPINGO OOPHORECTOMY WITH MINI LAPAROTOMY FOR DRAINAGE;  Surgeon: Lafonda Mosses, MD;  [facility];  Service: Gynecology;  Laterality: Bilateral;  MINI LAP FIRST   TONSILLECTOMY     VULVECTOMY N/A 11/10/2020   Procedure: WIDE EXCISION VULVECTOMY;  Surgeon: Lafonda Mosses, MD;  [facility];  Service: Gynecology;  Laterality: N/A;    There were no vitals filed for this visit.   Subjective Assessment - 10/12/21 1019     Subjective This cold weather is making all of my joints hurt.    Pertinent History CAD, arthritis    Patient Stated Goals be able to walk without using her cane    Currently in Pain? Yes    Pain Score 4     Pain Location Knee    Pain Orientation  Right;Left    Pain Descriptors / Indicators Aching    Pain Type Chronic pain    Multiple Pain Sites Yes    Pain Score 5    Pain Location Back    Pain Orientation Lower    Pain Descriptors / Indicators Radiating    Pain Type Acute pain    Pain Radiating Towards into B hips    Aggravating Factors  cold weather                               [facility] Adult PT Treatment/Exercise - 10/12/21 0001       High Level Balance   High Level Balance Comments Ambulation while performing vertical and horizontal head turns 2x10 ft, 2 laps each      Lumbar Exercises: Aerobic   Nustep L5 x6' with PT present to discuss pt status      Knee/Hip Exercises: Seated   Long Arc Quad Strengthening;Both;2 sets;10 reps    Long Arc Quad Weight 3 lbs.    Clamshell with TheraBand Green   2x10   Marching Both;2 sets;10 reps    Marching Limitations 3    Hamstring Curl Strengthening;Both;2 sets;10 reps    Hamstring Limitations green tband    Abduction/Adduction  Strengthening;Both;2 sets;10 reps    Abd/Adduction Limitations 3#, hip abduction scissors    Sit to Sand 2 sets;5 reps;with UE support   standing on blue foam                      PT Short Term Goals - 08/02/21 1014       PT SHORT TERM GOAL #1   Title Pt will be independent with her initial HEP to improve LE strength and balance.    Status Achieved      PT SHORT TERM GOAL #2   Title Pt will consistently use her SPC at the new adjusted height to improve her mechanics and safety with ambulation.    Status Achieved               PT Long Term Goals - 09/30/21 0955       PT LONG TERM GOAL #1   Title Pt will complete the 5x sit to stand in less than 14 sec without the need for UE support to reflect improvements in LE strength.    Status Partially Met      PT LONG TERM GOAL #2   Title Pt will complete TUG in less than 14 sec with LRAD to reflect improvements in her balance.    Status Achieved  PT LONG  TERM GOAL #3   Title Pt to have a BERG of at least 49/56 to place her at a lower risk of falling.    Status Partially Met   46/56     PT LONG TERM GOAL #4   Title Pt will have atleast 4/5 MMT strength in hip abductors to help with single leg stability.    Status Partially Met                   Plan - 10/12/21 1055     Clinical Impression Statement Ms Sevillano reports that she had some dizziness the other week when she was turning her head back and forth to check for traffic.  Practiced head turns during ambulation today, pt with some reports of minimal dizziness, but able to continue with slow pace/cadence. Pt reports that she was gifted a massage gun for Christmas as well a small floor pedal bike that she plans to start using. Pt continues to progress towards goal related activities.    PT Treatment/Interventions ADLs/Self Care Home Management;Gait training;Stair training;Functional mobility training;Therapeutic activities;Therapeutic exercise;Neuromuscular re-education;Balance training;Patient/family education;Manual techniques;Passive range of motion;Dry needling;Taping;Joint Manipulations;Cryotherapy;Electrical Stimulation;Iontophoresis 18m/ml Dexamethasone;Moist Heat    PT Next Visit Plan Balance activities, functional strength. LE strength and endurance.    Recommended Other Services all certs have been signed.    Consulted and Agree with Plan of Care Patient             Patient will benefit from skilled therapeutic intervention in order to improve the following deficits and impairments:  Decreased balance, Decreased endurance, Difficulty walking, Decreased coordination, Decreased safety awareness, Decreased strength  Visit Diagnosis: Unsteadiness on feet  Repeated falls  Muscle weakness (generalized)     Problem List Patient Active Problem List   Diagnosis Date Noted   Tremor of both hands 08/16/2021   Aortic atherosclerosis ([place] 06/15/2021   Generalized  osteoarthritis of multiple sites 12/25/2020   Chronic pain disorder 12/25/2020   Pelvic mass in female    Bilateral lower extremity edema 04/13/2020   Shortness of breath    Anxiety disorder 10/29/2018   CKD (chronic kidney disease), stage III ([place] 06/27/2018   COPD (chronic obstructive pulmonary disease) ([place] 05/25/2018   Obesity 03/20/2018   Dyspnea on exertion 11/06/2017   Abnormal stress test 11/06/2017   Chronic diastolic CHF (congestive heart failure) ([place] 10/03/2017   Chronic combined systolic and diastolic heart failure ([place] 09/04/2017   Left ventricular dysfunction 07/31/2017   Cataract, nuclear sclerotic senile, bilateral 02/01/2017   Gouty arthritis of toe of left foot 08/09/2016   Hyperuricemia 08/09/2016   Osteoarthritis (arthritis due to wear and tear of joints) 01/14/2014   Coronary artery disease 08/02/2011   Mitral regurgitation 08/02/2011   OVERACTIVE BLADDER 07/02/2010   ACUTE CYSTITIS 05/25/2010   Pure hypercholesterolemia 08/11/2009   PEDAL EDEMA 07/22/2009   GERD 04/11/2008   Essential hypertension 05/30/2007   MYOCARDIAL INFARCTION, HX OF 05/30/2007   Allergic rhinitis 05/30/2007   LOW BACK PAIN 05/30/2007    SJuel Burrow PT, DPT 10/12/2021, 10:58 AM  [email or web address] [place] [email or web address] [address]Phone: [phone]  Fax:  [phone] Name: BJASLEN ADCOXMRN: [id number] of Birth: 603-May-1947

## 2021-10-14 ENCOUNTER — Encounter: Payer: Medicare Other | Admitting: Rehabilitative and Restorative Service Providers"

## 2021-10-19 ENCOUNTER — Ambulatory Visit: Payer: Medicare Other | Attending: Family Medicine | Admitting: Rehabilitative and Restorative Service Providers"

## 2021-10-19 ENCOUNTER — Other Ambulatory Visit: Payer: Self-pay

## 2021-10-19 ENCOUNTER — Encounter: Payer: Self-pay | Admitting: Rehabilitative and Restorative Service Providers"

## 2021-10-19 DIAGNOSIS — M6281 Muscle weakness (generalized): Secondary | ICD-10-CM | POA: Diagnosis not present

## 2021-10-19 DIAGNOSIS — R2681 Unsteadiness on feet: Secondary | ICD-10-CM | POA: Diagnosis not present

## 2021-10-19 DIAGNOSIS — R296 Repeated falls: Secondary | ICD-10-CM | POA: Diagnosis not present

## 2021-10-19 NOTE — Therapy (Signed)
[facility] @ [place], [place], [address] Phone: [phone]   Fax:  [phone]  Physical Therapy Treatment  Patient Details  Name: Tracy [name] MRN: [id number] Date of Birth: 01-07-1946 Referring Provider (PT): [name], MD   Encounter Date: 10/19/2021   PT End of Session - 10/19/21 0937     Visit Number 16    Date for PT Re-Evaluation 11/05/21    Authorization Type Medicare    Authorization Time Period 10/10/2021-11/05/21    Progress Note Due on Visit 20    PT Start Time 0930    PT Stop Time 1010    PT Time Calculation (min) 40 min    Activity Tolerance Patient tolerated treatment well    Behavior During Therapy [facility] for tasks assessed/performed             Past Medical History:  Diagnosis Date   Allergic rhinitis    Allergy    Anxiety    Arthritis    Bronchitis    CAD (coronary artery disease)    1/19 PCI/DES to [place] for ISR, normal EF.    Cancer ([place])    hx of precancerous cells in right breast    Cataract    Cervical dysplasia    unsure of procedure, possible "burning" in her late 47s   CHF (congestive heart failure) ([place])    Echo 06/2019: EF 55-60, elevated LVEDP, normal RV SF, mild MAC, mild MR, trivial TR   Complication of anesthesia    COPD (chronic obstructive pulmonary disease) (HCC)    early    Depression    Dyspnea    with exertion    Family history of adverse reaction to anesthesia    daughter- problems wiht n/v   GERD (gastroesophageal reflux disease)    Gout    Headache(784.0)    Heart murmur    hx of years ago    Hyperlipidemia    Hypertension    Low back pain    Menopausal syndrome    Myocardial infarct ([place]) 2007   hx of   Overactive bladder    PONV (postoperative nausea and vomiting)    Sleep apnea     Past Surgical History:  Procedure Laterality Date   ANGIOPLASTY     stent 2007   BREAST LUMPECTOMY WITH RADIOACTIVE SEED LOCALIZATION Right  04/28/2020   Procedure: RIGHT BREAST LUMPECTOMY X 2  WITH RADIOACTIVE SEED LOCALIZATION;  Surgeon: Donnie Mesa, MD;  Location: [facility];  Service: General;  Laterality: Right;  LMA   CARDIAC CATHETERIZATION     COLONOSCOPY     CORONARY STENT INTERVENTION N/A 11/06/2017   Procedure: CORONARY STENT INTERVENTION;  Surgeon: [name], MD;  Location: [facility];  Service: Cardiovascular;  Laterality: N/A;   LEFT HEART CATH AND CORONARY ANGIOGRAPHY N/A 11/06/2017   Procedure: LEFT HEART CATH AND CORONARY ANGIOGRAPHY;  Surgeon: [name], MD;  Location: [facility];  Service: Cardiovascular;  Laterality: N/A;   LEFT HEART CATH AND CORONARY ANGIOGRAPHY N/A 07/24/2019   Procedure: LEFT HEART CATH AND CORONARY ANGIOGRAPHY;  Surgeon: Burnell Blanks, MD;  Location: [facility];  Service: Cardiovascular;  Laterality: N/A;   RIGHT/LEFT HEART CATH AND CORONARY ANGIOGRAPHY N/A 09/29/2020   Procedure: RIGHT/LEFT HEART CATH AND CORONARY ANGIOGRAPHY;  Surgeon: Belva Crome, MD;  Location: [facility];  Service: Cardiovascular;  Laterality: N/A;   ROBOTIC ASSISTED BILATERAL SALPINGO OOPHERECTOMY Bilateral  11/10/2020   Procedure: XI ROBOTIC ASSISTED BILATERAL SALPINGO OOPHORECTOMY WITH MINI LAPAROTOMY FOR DRAINAGE;  Surgeon: Lafonda Mosses, MD;  [facility];  Service: Gynecology;  Laterality: Bilateral;  MINI LAP FIRST   TONSILLECTOMY     VULVECTOMY N/A 11/10/2020   Procedure: WIDE EXCISION VULVECTOMY;  Surgeon: Lafonda Mosses, MD;  [facility];  Service: Gynecology;  Laterality: N/A;    There were no vitals filed for this visit.   Subjective Assessment - 10/19/21 0936     Subjective Pt reports feeling better this AM.    Patient Stated Goals be able to walk without using her cane    Currently in Pain? Yes    Pain Score 2     Pain Location Knee    Pain Orientation Left    Pain Descriptors / Indicators Aching    Pain Type  Chronic pain                               [facility] Adult PT Treatment/Exercise - 10/19/21 0001       High Level Balance   High Level Balance Comments Ambulation while performing vertical and horizontal head turns down long PT gym hallway and back each.      Lumbar Exercises: Aerobic   Nustep L3 x6' with PT present to discuss pt status      Knee/Hip Exercises: Standing   Hip Flexion Stengthening;Both;1 set;10 reps    Hip Abduction Stengthening;Both;1 set;10 reps    Hip Extension Stengthening;Both;1 set;10 reps;Knee straight      Knee/Hip Exercises: Seated   Long Arc Quad Strengthening;Both;2 sets;10 reps    Long Arc Quad Weight 3 lbs.    Clamshell with TheraBand Green   2x10   Marching Both;2 sets;10 reps    Marching Limitations 3    Hamstring Curl Strengthening;Both;2 sets;10 reps    Hamstring Limitations green tband    Abduction/Adduction  Strengthening;Both;2 sets;10 reps    Abd/Adduction Limitations 3#, hip abduction scissors    Sit to Sand 2 sets;5 reps;with UE support   from blue foam                      PT Short Term Goals - 08/02/21 1014       PT SHORT TERM GOAL #1   Title Pt will be independent with her initial HEP to improve LE strength and balance.    Status Achieved      PT SHORT TERM GOAL #2   Title Pt will consistently use her SPC at the new adjusted height to improve her mechanics and safety with ambulation.    Status Achieved               PT Long Term Goals - 09/30/21 0955       PT LONG TERM GOAL #1   Title Pt will complete the 5x sit to stand in less than 14 sec without the need for UE support to reflect improvements in LE strength.    Status Partially Met      PT LONG TERM GOAL #2   Title Pt will complete TUG in less than 14 sec with LRAD to reflect improvements in her balance.    Status Achieved      PT LONG TERM GOAL #3   Title Pt to have a BERG of at least 49/56 to place her at a lower risk of falling.  Status Partially Met   46/56     PT LONG TERM GOAL #4   Title Pt will have atleast 4/5 MMT strength in hip abductors to help with single leg stability.    Status Partially Met                   Plan - 10/19/21 1005     Clinical Impression Statement Tracy [name] continues to progress towards goal related activities. She states that she has been feeling less dizzy and was able to drive to her apointment this morning. Pt able to complete head turns during ambulation for a longer length and with slight improved fluidity noted.  Pt states that she has not used her pedal bike yet due to being so busy over the holidays. Pt continues to require skilled PT to continues towards goal related activities and improved safety and decreased risk of falling.    PT Treatment/Interventions ADLs/Self Care Home Management;Gait training;Stair training;Functional mobility training;Therapeutic activities;Therapeutic exercise;Neuromuscular re-education;Balance training;Patient/family education;Manual techniques;Passive range of motion;Dry needling;Taping;Joint Manipulations;Cryotherapy;Electrical Stimulation;Iontophoresis 92m/ml Dexamethasone;Moist Heat    PT Next Visit Plan Balance activities, functional strength. LE strength and endurance.    Consulted and Agree with Plan of Care Patient             Patient will benefit from skilled therapeutic intervention in order to improve the following deficits and impairments:  Decreased balance, Decreased endurance, Difficulty walking, Decreased coordination, Decreased safety awareness, Decreased strength  Visit Diagnosis: Unsteadiness on feet  Repeated falls  Muscle weakness (generalized)     Problem List Patient Active Problem List   Diagnosis Date Noted   Tremor of both hands 08/16/2021   Aortic atherosclerosis ([place] 06/15/2021   Generalized osteoarthritis of multiple sites 12/25/2020   Chronic pain disorder 12/25/2020   Pelvic mass in female     Bilateral lower extremity edema 04/13/2020   Shortness of breath    Anxiety disorder 10/29/2018   CKD (chronic kidney disease), stage III ([place] 06/27/2018   COPD (chronic obstructive pulmonary disease) ([place] 05/25/2018   Obesity 03/20/2018   Dyspnea on exertion 11/06/2017   Abnormal stress test 11/06/2017   Chronic diastolic CHF (congestive heart failure) ([place] 10/03/2017   Chronic combined systolic and diastolic heart failure ([place] 09/04/2017   Left ventricular dysfunction 07/31/2017   Cataract, nuclear sclerotic senile, bilateral 02/01/2017   Gouty arthritis of toe of left foot 08/09/2016   Hyperuricemia 08/09/2016   Osteoarthritis (arthritis due to wear and tear of joints) 01/14/2014   Coronary artery disease 08/02/2011   Mitral regurgitation 08/02/2011   OVERACTIVE BLADDER 07/02/2010   ACUTE CYSTITIS 05/25/2010   Pure hypercholesterolemia 08/11/2009   PEDAL EDEMA 07/22/2009   GERD 04/11/2008   Essential hypertension 05/30/2007   MYOCARDIAL INFARCTION, HX OF 05/30/2007   Allergic rhinitis 05/30/2007   LOW BACK PAIN 05/30/2007    SJuel Burrow PT, DPT 10/19/2021, 10:10 AM  [email or web address] [address]Phone: [phone]  Fax:  [phone] Name: BKISA FUJIIMRN: [id number] of Birth: [birth year]

## 2021-10-21 ENCOUNTER — Ambulatory Visit: Payer: Medicare Other | Admitting: Rehabilitative and Restorative Service Providers"

## 2021-10-21 ENCOUNTER — Other Ambulatory Visit: Payer: Self-pay

## 2021-10-21 ENCOUNTER — Encounter: Payer: Self-pay | Admitting: Rehabilitative and Restorative Service Providers"

## 2021-10-21 DIAGNOSIS — M6281 Muscle weakness (generalized): Secondary | ICD-10-CM

## 2021-10-21 DIAGNOSIS — R2681 Unsteadiness on feet: Secondary | ICD-10-CM | POA: Diagnosis not present

## 2021-10-21 DIAGNOSIS — R296 Repeated falls: Secondary | ICD-10-CM | POA: Diagnosis not present

## 2021-10-21 NOTE — Therapy (Signed)
[facility] @ [address] [place], [place], [address] Phone: [phone]   Fax:  [phone]  Physical Therapy Treatment  Patient Details  Name: Tracy [name] MRN: [id number] Date of Birth: 07/12/1946 Referring Provider (PT): [name], MD   Encounter Date: 10/21/2021   PT End of Session - 10/21/21 0932     Visit Number 17    Date for PT Re-Evaluation 11/05/21    Authorization Type Medicare    Authorization Time Period 10/10/2021-11/05/21    Progress Note Due on Visit 25    PT Start Time 0928    PT Stop Time 1010    PT Time Calculation (min) 42 min    Activity Tolerance Patient tolerated treatment well    Behavior During Therapy [facility] for tasks assessed/performed             Past Medical History:  Diagnosis Date   Allergic rhinitis    Allergy    Anxiety    Arthritis    Bronchitis    CAD (coronary artery disease)    1/19 PCI/DES to [place] for ISR, normal EF.    Cancer ([place])    hx of precancerous cells in right breast    Cataract    Cervical dysplasia    unsure of procedure, possible "burning" in her late 67s   CHF (congestive heart failure) ([place])    Echo 06/2019: EF 55-60, elevated LVEDP, normal RV SF, mild MAC, mild MR, trivial TR   Complication of anesthesia    COPD (chronic obstructive pulmonary disease) (HCC)    early    Depression    Dyspnea    with exertion    Family history of adverse reaction to anesthesia    daughter- problems wiht n/v   GERD (gastroesophageal reflux disease)    Gout    Headache(784.0)    Heart murmur    hx of years ago    Hyperlipidemia    Hypertension    Low back pain    Menopausal syndrome    Myocardial infarct ([place]) 2007   hx of   Overactive bladder    PONV (postoperative nausea and vomiting)    Sleep apnea     Past Surgical History:  Procedure Laterality Date   ANGIOPLASTY     stent 2007   BREAST LUMPECTOMY WITH RADIOACTIVE SEED LOCALIZATION Right  04/28/2020   Procedure: RIGHT BREAST LUMPECTOMY X 2  WITH RADIOACTIVE SEED LOCALIZATION;  Surgeon: Donnie Mesa, MD;  Location: [facility];  Service: General;  Laterality: Right;  LMA   CARDIAC CATHETERIZATION     COLONOSCOPY     CORONARY STENT INTERVENTION N/A 11/06/2017   Procedure: CORONARY STENT INTERVENTION;  Surgeon: [name], MD;  Location: [facility];  Service: Cardiovascular;  Laterality: N/A;   LEFT HEART CATH AND CORONARY ANGIOGRAPHY N/A 11/06/2017   Procedure: LEFT HEART CATH AND CORONARY ANGIOGRAPHY;  Surgeon: [name], MD;  Location: [facility];  Service: Cardiovascular;  Laterality: N/A;   LEFT HEART CATH AND CORONARY ANGIOGRAPHY N/A 07/24/2019   Procedure: LEFT HEART CATH AND CORONARY ANGIOGRAPHY;  Surgeon: Burnell Blanks, MD;  Location: [facility];  Service: Cardiovascular;  Laterality: N/A;   RIGHT/LEFT HEART CATH AND CORONARY ANGIOGRAPHY N/A 09/29/2020   Procedure: RIGHT/LEFT HEART CATH AND CORONARY ANGIOGRAPHY;  Surgeon: Belva Crome, MD;  Location: [facility];  Service: Cardiovascular;  Laterality: N/A;   ROBOTIC ASSISTED BILATERAL SALPINGO OOPHERECTOMY Bilateral  11/10/2020   Procedure: XI ROBOTIC ASSISTED BILATERAL SALPINGO OOPHORECTOMY WITH MINI LAPAROTOMY FOR DRAINAGE;  Surgeon: Lafonda Mosses, MD;  Location: [facility];  Service: Gynecology;  Laterality: Bilateral;  MINI LAP FIRST   TONSILLECTOMY     VULVECTOMY N/A 11/10/2020   Procedure: WIDE EXCISION VULVECTOMY;  Surgeon: Lafonda Mosses, MD;  Location: [facility];  Service: Gynecology;  Laterality: N/A;    There were no vitals filed for this visit.   Subjective Assessment - 10/21/21 0931     Subjective Pt reports overall feeling better.  Pt reports feeling 50% better since starting PT.    Pertinent History CAD, arthritis    Patient Stated Goals be able to walk without using her cane    Currently in Pain? Yes    Pain Score 2     Pain Location  Knee    Pain Orientation Left;Right    Pain Descriptors / Indicators Aching    Pain Type Chronic pain                               [facility] Adult PT Treatment/Exercise - 10/21/21 0001       Transfers   Five time sit to stand comments  18 sec with use of UE      Timed Up and Go Test   TUG Normal TUG    Normal TUG (seconds) 14.6      High Level Balance   High Level Balance Comments Ambulation while performing vertical and horizontal head turns down long PT gym hallway and back each.      Lumbar Exercises: Aerobic   Nustep L4 x6' with PT present to discuss pt status   420 steps     Knee/Hip Exercises: Standing   Hip Flexion Stengthening;Both;1 set;10 reps    Hip Abduction Stengthening;Both;1 set;10 reps    Hip Extension Stengthening;Both;1 set;10 reps;Knee straight    Other Standing Knee Exercises Alt LE toe taps on 6" step without UE support 2x10 each   cuing to slow down to increase single leg stance     Knee/Hip Exercises: Seated   Long Arc Quad Strengthening;Both;2 sets;10 reps    Long Arc Quad Weight 3 lbs.    Marching Both;2 sets;10 reps    Marching Limitations 3    Abduction/Adduction  Strengthening;Both;2 sets;10 reps    Abd/Adduction Limitations 3#, hip abduction scissors    Sit to Sand 2 sets;5 reps;with UE support             Vestibular Treatment/Exercise - 10/21/21 0001       Vestibular Treatment/Exercise   Gaze Exercises X1 Viewing Horizontal      X1 Viewing Horizontal   Comments seated 1x1 VOR to tolerance x3 reps                      PT Short Term Goals - 08/02/21 1014       PT SHORT TERM GOAL #1   Title Pt will be independent with her initial HEP to improve LE strength and balance.    Status Achieved      PT SHORT TERM GOAL #2   Title Pt will consistently use her SPC at the new adjusted height to improve her mechanics and safety with ambulation.    Status Achieved               PT Long Term Goals -  10/21/21 1026  PT LONG TERM GOAL #1   Title Pt will complete the 5x sit to stand in less than 14 sec without the need for UE support to reflect improvements in LE strength.    Status Partially Met      PT LONG TERM GOAL #3   Title Pt to have a BERG of at least 49/56 to place her at a lower risk of falling.    Status Partially Met      PT LONG TERM GOAL #4   Title Pt will have atleast 4/5 MMT strength in hip abductors to help with single leg stability.    Status Partially Met                   Plan - 10/21/21 1018     Clinical Impression Statement Ms Herd with some complaints of dizziness today and increased non-essential tremor. Pt able to perform vestibular seated VOR 1x1, but required initial tactile cuing for pacing of horizontal head turn and velocity needed. Following vestibular exercises, pt did report decreased dizziness with ambulation and had improved gait pattern noted in clinic. Pt continues to require skilled PT to progress towards goal related activities.    PT Treatment/Interventions ADLs/Self Care Home Management;Gait training;Stair training;Functional mobility training;Therapeutic activities;Therapeutic exercise;Neuromuscular re-education;Balance training;Patient/family education;Manual techniques;Passive range of motion;Dry needling;Taping;Joint Manipulations;Cryotherapy;Electrical Stimulation;Iontophoresis 37m/ml Dexamethasone;Moist Heat    PT Next Visit Plan Balance activities, functional strength. LE strength and endurance.    Consulted and Agree with Plan of Care Patient             Patient will benefit from skilled therapeutic intervention in order to improve the following deficits and impairments:  Decreased balance, Decreased endurance, Difficulty walking, Decreased coordination, Decreased safety awareness, Decreased strength  Visit Diagnosis: Unsteadiness on feet  Repeated falls  Muscle weakness (generalized)     Problem  List Patient Active Problem List   Diagnosis Date Noted   Tremor of both hands 08/16/2021   Aortic atherosclerosis ([place] 06/15/2021   Generalized osteoarthritis of multiple sites 12/25/2020   Chronic pain disorder 12/25/2020   Pelvic mass in female    Bilateral lower extremity edema 04/13/2020   Shortness of breath    Anxiety disorder 10/29/2018   CKD (chronic kidney disease), stage III ([place] 06/27/2018   COPD (chronic obstructive pulmonary disease) ([place] 05/25/2018   Obesity 03/20/2018   Dyspnea on exertion 11/06/2017   Abnormal stress test 11/06/2017   Chronic diastolic CHF (congestive heart failure) ([place] 10/03/2017   Chronic combined systolic and diastolic heart failure ([place] 09/04/2017   Left ventricular dysfunction 07/31/2017   Cataract, nuclear sclerotic senile, bilateral 02/01/2017   Gouty arthritis of toe of left foot 08/09/2016   Hyperuricemia 08/09/2016   Osteoarthritis (arthritis due to wear and tear of joints) 01/14/2014   Coronary artery disease 08/02/2011   Mitral regurgitation 08/02/2011   OVERACTIVE BLADDER 07/02/2010   ACUTE CYSTITIS 05/25/2010   Pure hypercholesterolemia 08/11/2009   PEDAL EDEMA 07/22/2009   GERD 04/11/2008   Essential hypertension 05/30/2007   MYOCARDIAL INFARCTION, HX OF 05/30/2007   Allergic rhinitis 05/30/2007   LOW BACK PAIN 05/30/2007    SJuel Burrow PT, DPT 10/21/2021, 10:28 AM  [email or web address] [address]: [phone]  Fax:  [phone] Name: Tracy MAXCYMRN: [id number] of Birth: 611-Jun-1947

## 2021-10-25 ENCOUNTER — Telehealth: Payer: Self-pay | Admitting: Family Medicine

## 2021-10-25 NOTE — Telephone Encounter (Signed)
Patient called in with symptoms of  Dizziness,Double vision stating she has had symptoms for a few days...  Due to symptoms I transferred patient to the Triage nurse

## 2021-10-25 NOTE — Telephone Encounter (Signed)
Patient has an appointment tomorrow with pcp, declined to go to urgent care and/or ED.

## 2021-10-25 NOTE — Progress Notes (Signed)
ACUTE VISIT Chief Complaint  Patient presents with   Dizziness    Woke up sat and was seeing double, has gotten better since but has a hard time focusing. Has been taking the motion sickness medication a few times, with little help.   Pain    Back pain, woke up this morning and it wa real bad. Used a heating pad with a little relief. Also tried tylenol    Abdominal Pain    Feels like a burning, goin on a while.   HPI: Ms.Merlina L Elliff is a 76 y.o. female, who is here today with above complaints.  3 days ago dizziness while she was still in bed. She has had problem intermittent for a while. Movement and spinning sensation, exacerbated by movement. Intermittent. Episodes lasts a few minutes. She is afraid of driving because dizziness is aggravated by head movement when looking for cars coming. Doing vestibular exercises with PT, also helping with balance. No associated nausea or vomiting. "Little ringing in left ear", chronic. No changes in hearing.  She is following with neurologist, Dr Tat, who she also saw for tremor.  Meclizine has helped in the past. No falls since her last visit.  HTN and CKD III: She is on Losartan 100 mg daily,Imdur 30 mg daily, metoprolol succinate 100 mg daily,and Amlodipine 5 mg daily. She takes Furosemide 40 mg daily for edema. Last e GFR was mildly worse, 37 (52 in 04/2021). She has not noted decreased urine output, gross hematuria,or foam in urine.  Component     Latest Ref Rng & Units 08/16/2021          Sodium     135 - 145 mEq/L 141  Potassium     3.5 - 5.1 mEq/L 4.1  Chloride     96 - 112 mEq/L 105  CO2     19 - 32 mEq/L 27  Glucose     70 - 99 mg/dL 106 (H)  BUN     6 - 23 mg/dL 38 (H)  Creatinine     0.40 - 1.20 mg/dL 1.41 (H)  Calcium     8.4 - 10.5 mg/dL 9.2  GFR, Estimated     >60 mL/min   Anion gap     5 - 15    Also episode of diplopia that lasted a few hours, she had little headache at that time.Alleviated  by covering one eye. No associated blindness,focal weakness,or MS changes. She did not seek medical attention.  Head CTA on 08/03/21:  1. No acute intracranial process. 2. No intracranial large vessel occlusion.  Periumbilical burning sensation for a month x 2 episodes, "really bad." Exacerbated by food intake, after eating. It last "a bit", resolves spontaneously. She feels better with defecation. Some heartburn. She takes Protonix 40 mg for GERD. No changes in bowel habits,melena,or blood in stool.  Lower back pain, woke up with pain today. It has been intermittent for years. No hx of trauma or unusual activity. Heating pad helps. Bilateral, not radiated. Negative for saddle anesthesia or bowel/bladder dysfunction. Left wrist pain, chronic, splint around wrist helps. PT has helped with knee pain.  Review of Systems  Constitutional:  Positive for fatigue. Negative for chills and fever.  HENT:  Negative for sore throat and trouble swallowing.   Respiratory:  Negative for cough, shortness of breath and wheezing.   Cardiovascular:  Negative for chest pain and palpitations.  Gastrointestinal:  Negative for nausea and vomiting.  Endocrine: Negative for cold  intolerance and heat intolerance.  Genitourinary:  Negative for dysuria and frequency.  Musculoskeletal:  Positive for arthralgias and gait problem.  Neurological:  Negative for syncope and facial asymmetry.  Rest see pertinent positives and negatives per HPI.  Current Outpatient Medications on File Prior to Visit  Medication Sig Dispense Refill   acetaminophen (TYLENOL) 500 MG tablet Take 500-1,000 mg by mouth every 6 (six) hours as needed for headache.      albuterol (PROVENTIL) (2.5 MG/3ML) 0.083% nebulizer solution Take 3 mLs (2.5 mg total) by nebulization every 6 (six) hours as needed for wheezing or shortness of breath. 300 mL 5   albuterol (VENTOLIN HFA) 108 (90 Base) MCG/ACT inhaler Inhale 2 puffs into the lungs every 4  (four) hours as needed for wheezing. (Patient taking differently: Inhale 2 puffs into the lungs as needed for wheezing.) 1 each 3   allopurinol (ZYLOPRIM) 100 MG tablet TAKE 1 TABLET BY MOUTH EVERY DAY 90 tablet 2   amLODipine (NORVASC) 5 MG tablet TAKE 1 TABLET (5 MG TOTAL) BY MOUTH DAILY. 90 tablet 3   ANORO ELLIPTA 62.5-25 MCG/INH AEPB INHALE 1 PUFF BY MOUTH EVERY DAY 60 each 12   APPLE CIDER VINEGAR PO Take 2,400 mg by mouth daily.     ASPERCREME LIDOCAINE EX Apply 1 application topically daily as needed (pain).     aspirin EC 81 MG tablet Take 81 mg by mouth daily.     Camphor-Eucalyptus-Menthol (VICKS VAPORUB EX) Apply 1 application topically daily as needed (congestion).     cetirizine (ZYRTEC) 10 MG tablet Take 10 mg by mouth daily.     Cholecalciferol (VITAMIN D3) 50 MCG (2000 UT) TABS Take 4,000 Units by mouth daily.      clopidogrel (PLAVIX) 75 MG tablet TAKE 1 TABLET BY MOUTH DAILY WITH BREAKFAST. 90 tablet 1   Cyanocobalamin (VITAMIN B-12) 5000 MCG SUBL Place 5,000 mcg under the tongue daily.      ezetimibe (ZETIA) 10 MG tablet TAKE 1 TABLET BY MOUTH DAILY 90 tablet 3   fluticasone (FLONASE) 50 MCG/ACT nasal spray SPRAY 2 SPRAYS INTO EACH NOSTRIL EVERY DAY (Patient taking differently: Place 2 sprays into both nostrils daily.) 48 mL 1   furosemide (LASIX) 40 MG tablet Take 40 mg by mouth daily as needed for fluid or edema.     guaiFENesin (MUCINEX) 600 MG 12 hr tablet Take 600 mg by mouth 2 (two) times daily as needed (congestion).     isosorbide mononitrate (IMDUR) 30 MG 24 hr tablet Take 1 tablet (30 mg total) by mouth daily. 90 tablet 3   losartan (COZAAR) 100 MG tablet TAKE 1 TABLET BY MOUTH EVERY DAY 90 tablet 3   metoprolol succinate (TOPROL-XL) 100 MG 24 hr tablet TAKE 1 TABLET BY MOUTH DAILY. TAKE WITH OR IMMEDIATELY FOLLOWING A MEAL. 90 tablet 2   Misc Natural Products (TART CHERRY ADVANCED) CAPS Take 1,000 capsules by mouth daily.      oxymetazoline (AFRIN) 0.05 % nasal  spray Place 1 spray into both nostrils 2 (two) times daily as needed for congestion.     pantoprazole (PROTONIX) 40 MG tablet TAKE 1 TABLET BY MOUTH EVERY DAY 90 tablet 2   potassium chloride SA (KLOR-CON) 20 MEQ tablet Take 20 mEq by mouth daily.     Simethicone (GAS-X PO) Take 2 tablets by mouth daily as needed (gas).     triamcinolone cream (KENALOG) 0.1 % Apply 1 application topically 2 (two) times daily. 30 g 1   pravastatin (  PRAVACHOL) 40 MG tablet Take 1 tablet (40 mg total) by mouth every evening. 90 tablet 3   [DISCONTINUED] nitroGLYCERIN (NITROSTAT) 0.4 MG SL tablet Place 1 tablet (0.4 mg total) under the tongue every 5 (five) minutes as needed for chest pain. 25 tablet 3   Current Facility-Administered Medications on File Prior to Visit  Medication Dose Route Frequency Provider Last Rate Last Admin   0.9 %  sodium chloride infusion  500 mL Intravenous Once Irene Shipper, MD        Past Medical History:  Diagnosis Date   Allergic rhinitis    Allergy    Anxiety    Arthritis    Bronchitis    CAD (coronary artery disease)    1/19 PCI/DES to [facility] for ISR, normal EF.    Cancer (HCC)    hx of precancerous cells in right breast    Cataract    Cervical dysplasia    unsure of procedure, possible "burning" in her late 57s   CHF (congestive heart failure) ([place])    Echo 06/2019: EF 55-60, elevated LVEDP, normal RV SF, mild MAC, mild MR, trivial TR   Complication of anesthesia    COPD (chronic obstructive pulmonary disease) (HCC)    early    Depression    Dyspnea    with exertion    Family history of adverse reaction to anesthesia    daughter- problems wiht n/v   GERD (gastroesophageal reflux disease)    Gout    Headache(784.0)    Heart murmur    hx of years ago    Hyperlipidemia    Hypertension    Low back pain    Menopausal syndrome    Myocardial infarct ([place]) 2007   hx of   Overactive bladder    PONV (postoperative nausea and vomiting)    Sleep apnea    Allergies   Allergen Reactions   Erythromycin Rash    But isn't certain   Sulfamethoxazole Rash    Social History   Socioeconomic History   Marital status: Married    Spouse name: Not on file   Number of children: 2   Years of education: Not on file   Highest education level: GED or equivalent  Occupational History   Occupation: retired   Occupation: retired    Comment: Scientist, physiological  Tobacco Use   Smoking status: Former    Packs/day: 1.25    Years: 52.00    Pack years: 65.00    Types: Cigarettes    Quit date: 2018    Years since quitting: 5.0   Smokeless tobacco: Never   Tobacco comments:    completely quit May of 2018; period of years she did not smoke   Vaping Use   Vaping Use: Never used  Substance and Sexual Activity   Alcohol use: Yes    Comment: seldom   Drug use: No   Sexual activity: Not Currently  Other Topics Concern   Not on file  Social History Narrative   Not on file   Social Determinants of Health   Financial Resource Strain: Low Risk    Difficulty of Paying Living Expenses: Not very hard  Food Insecurity: No Food Insecurity   Worried About Charity fundraiser in the Last Year: Never true   Ran Out of Food in the Last Year: Never true  Transportation Needs: No Transportation Needs   Lack of Transportation (Medical): No   Lack of Transportation (Non-Medical): No  Physical Activity: Insufficiently Active  Days of Exercise per Week: 1 day   Minutes of Exercise per Session: 20 min  Stress: Stress Concern Present   Feeling of Stress : To some extent  Social Connections: Unknown   Frequency of Communication with Friends and Family: Once a week   Frequency of Social Gatherings with Friends and Family: Patient refused   Attends Religious Services: Patient refused   Marine scientist or Organizations: No   Attends Archivist Meetings: Not on file   Marital Status: Married    Vitals:   10/26/21 1526  BP: 122/88  Pulse: 88  Resp: 16   Temp: 98.5 F (36.9 C)  SpO2: 95%   Body mass index is 32.61 kg/m.  Physical Exam Vitals and nursing note reviewed.  Constitutional:      General: She is not in acute distress.    Appearance: She is well-developed.  HENT:     Head: Normocephalic and atraumatic.     Mouth/Throat:     Mouth: Mucous membranes are moist.     Pharynx: Oropharynx is clear.  Eyes:     Conjunctiva/sclera: Conjunctivae normal.  Cardiovascular:     Rate and Rhythm: Normal rate and regular rhythm.     Heart sounds: No murmur heard.    Comments: LLE lymphedema. Trace pitting edema, bilateral. Pulmonary:     Effort: Pulmonary effort is normal. No respiratory distress.     Breath sounds: Normal breath sounds.  Abdominal:     Palpations: Abdomen is soft. There is no mass.     Tenderness: There is no abdominal tenderness.  Musculoskeletal:     Lumbar back: No tenderness or bony tenderness.     Comments: Pain with movement on examination table.  Skin:    General: Skin is warm.     Findings: No erythema or rash.  Neurological:     General: No focal deficit present.     Mental Status: She is alert and oriented to person, place, and time.     Cranial Nerves: No cranial nerve deficit.     Motor: Tremor (Hands) present.     Comments: Unstable gait assisted by a cane.  Psychiatric:     Comments: Well groomed, good eye contact.   ASSESSMENT AND PLAN:  Ms.Kayle was seen today for dizziness, pain and abdominal pain.  Diagnoses and all orders for this visit: Orders Placed This Encounter  Procedures   DG Lumbar Spine Complete   CBC   Basic metabolic panel   Lab Results  Component Value Date   WBC 9.5 10/26/2021   HGB 13.5 10/26/2021   HCT 42.5 10/26/2021   MCV 87.1 10/26/2021   PLT 251.0 10/26/2021   Lab Results  Component Value Date   CREATININE 1.07 10/26/2021   BUN 20 10/26/2021   NA 139 10/26/2021   K 4.3 10/26/2021   CL 105 10/26/2021   CO2 27 10/26/2021   Chronic bilateral  low back pain, unspecified whether sciatica present Dx,prognosis,and treatment options discussed.  No many options for pain management, for now recommend Tylenol 500 mg 3-4 times per day. She has not had imaging in a few years, X ray ordered today.  Diplopia Resolved. We discussed possible etiologies. She has no neurologic associated symptoms, so I do not think head imaging needs to be repeated at this time but she was instructed to seek immediate medical attention if it happens again. She has an eye care provider, recommend arranging appt.  Essential hypertension BP adequately controlled. Continue  Imdur,Losartan,metoprolol succinate, and Amlodipine same doses. Low salt diet.  Stage 3a chronic kidney disease (HCC) Cr 1.1-1.2 and e GFR high 40's and mid 50's. Adequate hydration,low salt diet,and avoidance of NSAID's to continue. Continue Losartan same dose.  Dizziness This is a chronic problem. We reviewed possible etiologies, some episodes are suggestive of vertigo. Meclizine has helped in the past, continue 25 mg daily as needed. Fall precautions discussed.  -     meclizine (ANTIVERT) 25 MG tablet; Take 1 tablet (25 mg total) by mouth 2 (two) times daily as needed for dizziness.  Abdominal pain, unspecified abdominal location Burning like sensation, ? Dyspepsia,radicular. Not reproducible on examination. She has had abdominal CT done about a year ago because right-sided abdominal pain that started after pelvic surgery. I do not thin imaging is needed today. Continue Protonix 40 mg daily and GERD precautions. Instructed about warning signs.  I spent a total of 46 minutes in both face to face and non face to face activities for this visit on the date of this encounter. During this time history was obtained and documented, examination was performed, prior labs/imaging reviewed, and assessment/plan discussed.  Return in about 4 weeks (around 11/23/2021).  Bobbe Quilter G. [name],  MD  [facility]. [place] office.  Discharge Instructions   None

## 2021-10-26 ENCOUNTER — Encounter: Payer: Self-pay | Admitting: Rehabilitative and Restorative Service Providers"

## 2021-10-26 ENCOUNTER — Other Ambulatory Visit: Payer: Self-pay

## 2021-10-26 ENCOUNTER — Ambulatory Visit (INDEPENDENT_AMBULATORY_CARE_PROVIDER_SITE_OTHER): Payer: Medicare Other | Admitting: Family Medicine

## 2021-10-26 ENCOUNTER — Ambulatory Visit (INDEPENDENT_AMBULATORY_CARE_PROVIDER_SITE_OTHER): Payer: Medicare Other

## 2021-10-26 ENCOUNTER — Ambulatory Visit: Payer: Medicare Other | Admitting: Rehabilitative and Restorative Service Providers"

## 2021-10-26 VITALS — BP 122/88 | HR 88 | Temp 98.5°F | Resp 16 | Ht 64.0 in

## 2021-10-26 DIAGNOSIS — M6281 Muscle weakness (generalized): Secondary | ICD-10-CM | POA: Diagnosis not present

## 2021-10-26 DIAGNOSIS — H532 Diplopia: Secondary | ICD-10-CM | POA: Diagnosis not present

## 2021-10-26 DIAGNOSIS — N1831 Chronic kidney disease, stage 3a: Secondary | ICD-10-CM | POA: Diagnosis not present

## 2021-10-26 DIAGNOSIS — R42 Dizziness and giddiness: Secondary | ICD-10-CM | POA: Diagnosis not present

## 2021-10-26 DIAGNOSIS — M545 Low back pain, unspecified: Secondary | ICD-10-CM

## 2021-10-26 DIAGNOSIS — R2681 Unsteadiness on feet: Secondary | ICD-10-CM

## 2021-10-26 DIAGNOSIS — G8929 Other chronic pain: Secondary | ICD-10-CM | POA: Diagnosis not present

## 2021-10-26 DIAGNOSIS — I10 Essential (primary) hypertension: Secondary | ICD-10-CM

## 2021-10-26 DIAGNOSIS — R109 Unspecified abdominal pain: Secondary | ICD-10-CM

## 2021-10-26 DIAGNOSIS — R296 Repeated falls: Secondary | ICD-10-CM | POA: Diagnosis not present

## 2021-10-26 NOTE — Therapy (Signed)
[facility] [place], [place], [address] Phone: [phone]   Fax:  [phone]  Physical Therapy Treatment  Patient Details  Name: Tracy [name] MRN: [id number] Date of Birth: Jun 10, 1946 Referring Provider (PT): [name], MD   Encounter Date: 10/26/2021   PT End of Session - 10/26/21 0936     Visit Number 18    Date for PT Re-Evaluation 11/05/21    Authorization Type Medicare    Authorization Time Period 10/10/2021-11/05/21    Progress Note Due on Visit 20    PT Start Time 0930    PT Stop Time 1010    PT Time Calculation (min) 40 min    Activity Tolerance Patient tolerated treatment well    Behavior During Therapy [facility] for tasks assessed/performed             Past Medical History:  Diagnosis Date   Allergic rhinitis    Allergy    Anxiety    Arthritis    Bronchitis    CAD (coronary artery disease)    1/19 PCI/DES to [place] for ISR, normal EF.    Cancer ([place])    hx of precancerous cells in right breast    Cataract    Cervical dysplasia    unsure of procedure, possible "burning" in her late 74s   CHF (congestive heart failure) ([place])    Echo 06/2019: EF 55-60, elevated LVEDP, normal RV SF, mild MAC, mild MR, trivial TR   Complication of anesthesia    COPD (chronic obstructive pulmonary disease) (HCC)    early    Depression    Dyspnea    with exertion    Family history of adverse reaction to anesthesia    daughter- problems wiht n/v   GERD (gastroesophageal reflux disease)    Gout    Headache(784.0)    Heart murmur    hx of years ago    Hyperlipidemia    Hypertension    Low back pain    Menopausal syndrome    Myocardial infarct ([place]) 2007   hx of   Overactive bladder    PONV (postoperative nausea and vomiting)    Sleep apnea     Past Surgical History:  Procedure Laterality Date   ANGIOPLASTY     stent 2007   BREAST LUMPECTOMY WITH RADIOACTIVE SEED LOCALIZATION Right  04/28/2020   Procedure: RIGHT BREAST LUMPECTOMY X 2  WITH RADIOACTIVE SEED LOCALIZATION;  Surgeon: Donnie Mesa, MD;  Location: [facility];  Service: General;  Laterality: Right;  LMA   CARDIAC CATHETERIZATION     COLONOSCOPY     CORONARY STENT INTERVENTION N/A 11/06/2017   Procedure: CORONARY STENT INTERVENTION;  Surgeon: [name], MD;  Location: [facility];  Service: Cardiovascular;  Laterality: N/A;   LEFT HEART CATH AND CORONARY ANGIOGRAPHY N/A 11/06/2017   Procedure: LEFT HEART CATH AND CORONARY ANGIOGRAPHY;  Surgeon: [name], MD;  Location: [facility];  Service: Cardiovascular;  Laterality: N/A;   LEFT HEART CATH AND CORONARY ANGIOGRAPHY N/A 07/24/2019   Procedure: LEFT HEART CATH AND CORONARY ANGIOGRAPHY;  Surgeon: Burnell Blanks, MD;  Location: [facility];  Service: Cardiovascular;  Laterality: N/A;   RIGHT/LEFT HEART CATH AND CORONARY ANGIOGRAPHY N/A 09/29/2020   Procedure: RIGHT/LEFT HEART CATH AND CORONARY ANGIOGRAPHY;  Surgeon: Belva Crome, MD;  Location: [facility];  Service: Cardiovascular;  Laterality: N/A;   ROBOTIC ASSISTED BILATERAL SALPINGO OOPHERECTOMY Bilateral  11/10/2020   Procedure: XI ROBOTIC ASSISTED BILATERAL SALPINGO OOPHORECTOMY WITH MINI LAPAROTOMY FOR DRAINAGE;  Surgeon: Lafonda Mosses, MD;  [facility];  Service: Gynecology;  Laterality: Bilateral;  MINI LAP FIRST   TONSILLECTOMY     VULVECTOMY N/A 11/10/2020   Procedure: WIDE EXCISION VULVECTOMY;  Surgeon: Lafonda Mosses, MD;  [facility];  Service: Gynecology;  Laterality: N/A;    There were no vitals filed for this visit.   Subjective Assessment - 10/26/21 0933     Subjective Pt reports that when she woke up on Saturday, she was having bad dizziness and lightheadedness, then she was having double vision. Pt reports that she has a MD appointment today at 3:30 am to assess her dizziness.    Patient Stated Goals be able  to walk without using her cane    Currently in Pain? Yes    Pain Score 4     Pain Location Back    Pain Orientation Lower    Pain Descriptors / Indicators Other (Comment)   "hurts"   Pain Score 2    Pain Location Wrist    Pain Orientation Left    Pain Descriptors / Indicators Discomfort    Pain Type Acute pain    Aggravating Factors  "I might have slept wrong"                               [facility] Adult PT Treatment/Exercise - 10/26/21 0001       High Level Balance   High Level Balance Activities Tandem walking;Side stepping;Backward walking    High Level Balance Comments x2 laps of 10 ft each      Lumbar Exercises: Stretches   Other Lumbar Stretch Exercise 3 way green pball roll out 5 sec x5 each      Lumbar Exercises: Aerobic   Nustep L4 x6' with PT present to discuss pt status      Knee/Hip Exercises: Seated   Long Arc Quad Strengthening;Both;2 sets;10 reps    Long Arc Quad Weight 3 lbs.    Clamshell with TheraBand Red   2x15   Marching Both;2 sets;10 reps    Marching Limitations 3    Hamstring Curl Strengthening;Both;2 sets;15 reps    Hamstring Limitations red tband    Abduction/Adduction  Strengthening;Both;2 sets;10 reps    Abd/Adduction Limitations 3#, hip abduction scissors    Sit to Sand 2 sets;5 reps;with UE support                       PT Short Term Goals - 08/02/21 1014       PT SHORT TERM GOAL #1   Title Pt will be independent with her initial HEP to improve LE strength and balance.    Status Achieved      PT SHORT TERM GOAL #2   Title Pt will consistently use her SPC at the new adjusted height to improve her mechanics and safety with ambulation.    Status Achieved               PT Long Term Goals - 10/21/21 1026       PT LONG TERM GOAL #1   Title Pt will complete the 5x sit to stand in less than 14 sec without the need for UE support to reflect improvements in LE strength.    Status Partially Met      PT  LONG  TERM GOAL #3   Title Pt to have a BERG of at least 49/56 to place her at a lower risk of falling.    Status Partially Met      PT LONG TERM GOAL #4   Title Pt will have atleast 4/5 MMT strength in hip abductors to help with single leg stability.    Status Partially Met                   Plan - 10/26/21 1021     Clinical Impression Statement Ms Badders was limited today secondary to having increased dizziness feeling. Pt to follow up with MD today secondary to increased dizziness over the weekend through today.  Pt was able to participate in session. She required CGA with tandem and retrogait, otherwise, was able to complete standing exercises without assistance.  Added physioball rollout secondary to patient having reports of back pain today.  Pt continues to require skilled PT to continue to progress her ability to complete functional activities.    PT Treatment/Interventions ADLs/Self Care Home Management;Gait training;Stair training;Functional mobility training;Therapeutic activities;Therapeutic exercise;Neuromuscular re-education;Balance training;Patient/family education;Manual techniques;Passive range of motion;Dry needling;Taping;Joint Manipulations;Cryotherapy;Electrical Stimulation;Iontophoresis 24m/ml Dexamethasone;Moist Heat    PT Next Visit Plan Balance activities, functional strength. LE strength and endurance.    Consulted and Agree with Plan of Care Patient             Patient will benefit from skilled therapeutic intervention in order to improve the following deficits and impairments:  Decreased balance, Decreased endurance, Difficulty walking, Decreased coordination, Decreased safety awareness, Decreased strength  Visit Diagnosis: Unsteadiness on feet  Repeated falls  Muscle weakness (generalized)     Problem List Patient Active Problem List   Diagnosis Date Noted   Tremor of both hands 08/16/2021   Aortic atherosclerosis ([facility] 06/15/2021    Generalized osteoarthritis of multiple sites 12/25/2020   Chronic pain disorder 12/25/2020   Pelvic mass in female    Bilateral lower extremity edema 04/13/2020   Shortness of breath    Anxiety disorder 10/29/2018   CKD (chronic kidney disease), stage III ([facility] 06/27/2018   COPD (chronic obstructive pulmonary disease) ([facility] 05/25/2018   Obesity 03/20/2018   Dyspnea on exertion 11/06/2017   Abnormal stress test 11/06/2017   Chronic diastolic CHF (congestive heart failure) ([facility] 10/03/2017   Chronic combined systolic and diastolic heart failure ([facility] 09/04/2017   Left ventricular dysfunction 07/31/2017   Cataract, nuclear sclerotic senile, bilateral 02/01/2017   Gouty arthritis of toe of left foot 08/09/2016   Hyperuricemia 08/09/2016   Osteoarthritis (arthritis due to wear and tear of joints) 01/14/2014   Coronary artery disease 08/02/2011   Mitral regurgitation 08/02/2011   OVERACTIVE BLADDER 07/02/2010   ACUTE CYSTITIS 05/25/2010   Pure hypercholesterolemia 08/11/2009   PEDAL EDEMA 07/22/2009   GERD 04/11/2008   Essential hypertension 05/30/2007   MYOCARDIAL INFARCTION, HX OF 05/30/2007   Allergic rhinitis 05/30/2007   LOW BACK PAIN 05/30/2007    SJuel Burrow PT, DPT 10/26/2021, 10:30 AM  [email or web address] [address]Phone: [phone]  Fax:  [phone] Name: BMRYTLE BENTOMRN: [id number] of Birth: [birth year]

## 2021-10-26 NOTE — Patient Instructions (Signed)
A few things to remember from today's visit:   Diplopia  Essential hypertension  Stage 3a chronic kidney disease ([place]) - Plan: Basic metabolic panel  Dizziness - Plan: CBC  Chronic bilateral low back pain, unspecified whether sciatica present - Plan: DG Lumbar Spine Complete  If you need refills please call your pharmacy. Do not use My Chart to request refills or for acute issues that need immediate attention.   Please arrange appt with your eye care provider. If double vision happens again call 911. Fall prevention. Meclizine 25 mg daily as needed for dizziness. Back pain seems to be arthritis. Abdominal burning sensation could be digestive issue or nerve pain. If recurrent we may need imaging of back done.  Please be sure medication list is accurate. If a new problem present, please set up appointment sooner than planned today.

## 2021-10-27 ENCOUNTER — Ambulatory Visit: Payer: Medicare Other | Admitting: Family Medicine

## 2021-10-27 ENCOUNTER — Telehealth: Payer: Self-pay | Admitting: Family Medicine

## 2021-10-27 LAB — CBC
HCT: 42.5 % (ref 36.0–46.0)
Hemoglobin: 13.5 g/dL (ref 12.0–15.0)
MCHC: 31.8 g/dL (ref 30.0–36.0)
MCV: 87.1 fl (ref 78.0–100.0)
Platelets: 251 10*3/uL (ref 150.0–400.0)
RBC: 4.88 Mil/uL (ref 3.87–5.11)
RDW: 15.6 % — ABNORMAL HIGH (ref 11.5–15.5)
WBC: 9.5 10*3/uL (ref 4.0–10.5)

## 2021-10-27 LAB — BASIC METABOLIC PANEL
BUN: 20 mg/dL (ref 6–23)
CO2: 27 mEq/L (ref 19–32)
Calcium: 9.1 mg/dL (ref 8.4–10.5)
Chloride: 105 mEq/L (ref 96–112)
Creatinine, Ser: 1.07 mg/dL (ref 0.40–1.20)
GFR: 51.52 mL/min — ABNORMAL LOW (ref >60.00)
Glucose, Bld: 103 mg/dL — ABNORMAL HIGH (ref 70–99)
Potassium: 4.3 mEq/L (ref 3.5–5.1)
Sodium: 139 mEq/L (ref 135–145)

## 2021-10-27 MED ORDER — MECLIZINE HCL 25 MG PO TABS: 25.0000 mg | ORAL_TABLET | Freq: Two times a day (BID) | ORAL | 1 refills | Status: DC | PRN

## 2021-10-27 NOTE — Telephone Encounter (Signed)
Patient stated that she was in yesterday 01/10 and Dr.Jordan was supposed to put in an order for Meclizine 25mg  medication. The medication could be sent to the pharmacy on file.  The pharmacy is [id number] - [place], [address] [place], [place] [address].  Patient could be contacted at [phone].  Please advise.

## 2021-10-27 NOTE — Telephone Encounter (Signed)
Patient called to follow up on Meclizine 25mg  being sent in. I let patient know that message was sent back and we were waiting for Dr.Jordan to send it.   Patient would like a call when prescription is sent     Please send to     [id number] - [place], [address] [place], [place] [address].     Please advise

## 2021-10-27 NOTE — Telephone Encounter (Signed)
Pt is aware that Rx has been sent in.

## 2021-10-28 ENCOUNTER — Encounter: Payer: Medicare Other | Admitting: Rehabilitative and Restorative Service Providers"

## 2021-10-28 ENCOUNTER — Encounter: Payer: Self-pay | Admitting: Family Medicine

## 2021-11-03 ENCOUNTER — Ambulatory Visit: Payer: Medicare Other | Admitting: Rehabilitative and Restorative Service Providers"

## 2021-11-03 ENCOUNTER — Other Ambulatory Visit: Payer: Self-pay

## 2021-11-03 ENCOUNTER — Telehealth: Payer: Self-pay | Admitting: Family Medicine

## 2021-11-03 ENCOUNTER — Encounter: Payer: Self-pay | Admitting: Rehabilitative and Restorative Service Providers"

## 2021-11-03 DIAGNOSIS — M6281 Muscle weakness (generalized): Secondary | ICD-10-CM

## 2021-11-03 DIAGNOSIS — R2681 Unsteadiness on feet: Secondary | ICD-10-CM | POA: Diagnosis not present

## 2021-11-03 DIAGNOSIS — R296 Repeated falls: Secondary | ICD-10-CM

## 2021-11-03 DIAGNOSIS — R42 Dizziness and giddiness: Secondary | ICD-10-CM

## 2021-11-03 NOTE — Telephone Encounter (Signed)
Patient was at [facility] and is needing a new order for vestibular PT evaluation sent over to them.   Patient can be reached at [phone].  Please advise.

## 2021-11-03 NOTE — Telephone Encounter (Signed)
Referral placed.

## 2021-11-03 NOTE — Addendum Note (Signed)
Addended by: Rodrigo Ran on: 11/03/2021 10:34 AM   Modules accepted: Orders

## 2021-11-03 NOTE — Addendum Note (Signed)
Addended by: Nathanial Millman E on: 11/03/2021 01:44 PM   Modules accepted: Orders

## 2021-11-03 NOTE — Therapy (Signed)
[facility] °[facility] [place] °[address] °[place], [place], [address] °Phone: [phone]   Fax:  [phone] ° °Physical Therapy Treatment and Discharge Summary ° °Patient Details  °Name: Tracy [name] °MRN: [id number] °Date of Birth: 08/08/1946 °Referring Provider (PT): Betty Jordan, MD ° ° °Encounter Date: 11/03/2021 ° ° PT End of Session - 11/03/21 0934   ° ° Visit Number 19   ° Date for PT Re-Evaluation 11/05/21   ° Authorization Type Medicare   ° Authorization Time Period 10/10/2021-11/05/21   ° Progress Note Due on Visit 20   ° PT Start Time 0930   ° PT Stop Time 1010   ° PT Time Calculation (min) 40 min   ° Activity Tolerance Patient tolerated treatment well   ° Behavior During Therapy WFL for tasks assessed/performed   ° °  °  ° °  ° ° °Past Medical History:  °Diagnosis Date  ° Allergic rhinitis   ° Allergy   ° Anxiety   ° Arthritis   ° Bronchitis   ° CAD (coronary artery disease)   ° 1/19 PCI/DES to [facility] for ISR, normal EF.   ° Cancer (HCC)   ° hx of precancerous cells in right breast   ° Cataract   ° Cervical dysplasia   ° unsure of procedure, possible "burning" in her late 40s  ° CHF (congestive heart failure) (HCC)   ° Echo 06/2019: EF 55-60, elevated LVEDP, normal RV SF, mild MAC, mild MR, trivial TR  ° Complication of anesthesia   ° COPD (chronic obstructive pulmonary disease) (HCC)   ° early   ° Depression   ° Dyspnea   ° with exertion   ° Family history of adverse reaction to anesthesia   ° daughter- problems wiht n/v  ° GERD (gastroesophageal reflux disease)   ° Gout   ° Headache(784.0)   ° Heart murmur   ° hx of years ago   ° Hyperlipidemia   ° Hypertension   ° Low back pain   ° Menopausal syndrome   ° Myocardial infarct (HCC) 2007  ° hx of  ° Overactive bladder   ° PONV (postoperative nausea and vomiting)   ° Sleep apnea   ° ° °Past Surgical History:  °Procedure Laterality Date  ° ANGIOPLASTY    ° stent 2007  ° BREAST LUMPECTOMY WITH RADIOACTIVE SEED  LOCALIZATION Right 04/28/2020  ° Procedure: RIGHT BREAST LUMPECTOMY X 2  WITH RADIOACTIVE SEED LOCALIZATION;  Surgeon: Tsuei, Matthew, MD;  Location: [facility];  Service: General;  Laterality: Right;  LMA  ° CARDIAC CATHETERIZATION    ° COLONOSCOPY    ° CORONARY STENT INTERVENTION N/A 11/06/2017  ° Procedure: CORONARY STENT INTERVENTION;  Surgeon: End, Christopher, MD;  [facility];  Service: Cardiovascular;  Laterality: N/A;  ° LEFT HEART CATH AND CORONARY ANGIOGRAPHY N/A 11/06/2017  ° Procedure: LEFT HEART CATH AND CORONARY ANGIOGRAPHY;  Surgeon: End, Christopher, MD;  [facility];  Service: Cardiovascular;  Laterality: N/A;  ° LEFT HEART CATH AND CORONARY ANGIOGRAPHY N/A 07/24/2019  ° Procedure: LEFT HEART CATH AND CORONARY ANGIOGRAPHY;  Surgeon: McAlhany, Christopher D, MD;  [facility];  Service: Cardiovascular;  Laterality: N/A;  ° RIGHT/LEFT HEART CATH AND CORONARY ANGIOGRAPHY N/A 09/29/2020  ° Procedure: RIGHT/LEFT HEART CATH AND CORONARY ANGIOGRAPHY;  Surgeon: Smith, Henry W, MD;  [facility];  Service: Cardiovascular;  Laterality: N/A;  ° ROBOTIC ASSISTED BILATERAL   SALPINGO OOPHERECTOMY Bilateral 11/10/2020  ° Procedure: XI ROBOTIC ASSISTED BILATERAL SALPINGO OOPHORECTOMY WITH MINI LAPAROTOMY FOR DRAINAGE;  Surgeon: Tucker, Katherine R, MD;  [facility];  Service: Gynecology;  Laterality: Bilateral;  MINI LAP FIRST  ° TONSILLECTOMY    ° VULVECTOMY N/A 11/10/2020  ° Procedure: WIDE EXCISION VULVECTOMY;  Surgeon: Tucker, Katherine R, MD;  [facility];  Service: Gynecology;  Laterality: N/A;  ° ° °There were no vitals filed for this visit. ° ° Subjective Assessment - 11/03/21 0932   ° ° Subjective Pt reports being diagnosed with vertigo.  She reports the MD did run some blood work.  She states that she has an eye doctor appointment today secondary to double vision.   ° Pertinent History CAD, arthritis   ° Limitations  Walking   ° Patient Stated Goals be able to walk without using her cane   ° Currently in Pain? Yes   ° Pain Score 1    ° Pain Location Knee   ° Pain Orientation Left   ° Pain Descriptors / Indicators Aching   ° Pain Type Chronic pain   ° °  °  ° °  ° ° ° ° ° [facility] PT Assessment - 11/03/21 0001   ° °  ° Assessment  ° Medical Diagnosis unstable gait   ° Referring Provider (PT) Betty Jordan, MD   °  ° Precautions  ° Precautions Fall   °  ° Restrictions  ° Weight Bearing Restrictions No   °  ° Balance Screen  ° Has the patient fallen in the past 6 months Yes   ° How many times? 17 April 2021 was a fall where pt initially injured herself.  ° Has the patient had a decrease in activity level because of a fear of falling?  Yes   ° Is the patient reluctant to leave their home because of a fear of falling?  Yes   °  ° Home Environment  ° Living Environment Private residence   ° Living Arrangements Spouse/significant other   °  ° Prior Function  ° Level of Independence Independent   ° Vocation Retired   ° Leisure crafts   °  ° Cognition  ° Overall Cognitive Status Within Functional Limits for tasks assessed   °  ° Strength  ° Overall Strength Comments B hip strength grossly 4/5, B knee strength grossly 4+/5   °  ° Transfers  ° Five time sit to stand comments  14 with limited use of UE   °  ° Timed Up and Go Test  ° TUG Normal TUG   ° Normal TUG (seconds) 13.9   with SPC  ° °  °  ° °  ° ° ° ° ° ° ° ° ° ° ° ° ° ° ° ° [facility] Adult PT Treatment/Exercise - 11/03/21 0001   ° °  ° Standardized Balance Assessment  ° Standardized Balance Assessment Berg Balance Test   °  ° Berg Balance Test  ° Sit to Stand Able to stand without using hands and stabilize independently   ° Standing Unsupported Able to stand safely 2 minutes   ° Sitting with Back Unsupported but Feet Supported on Floor or Stool Able to sit safely and securely 2 minutes   ° Stand to Sit Sits safely with minimal use of hands   ° Transfers Able to transfer safely, minor use of  hands   ° Standing Unsupported with Eyes Closed Able   to stand 10 seconds safely   ° Standing Ubsupported with Feet Together Able to place feet together independently and stand 1 minute safely   ° From Standing, Reach Forward with Outstretched Arm Can reach forward >12 cm safely (5")   ° From Standing Position, Pick up Object from Floor Able to pick up shoe safely and easily   ° From Standing Position, Turn to Look Behind Over each Shoulder Looks behind one side only/other side shows less weight shift   ° Turn 360 Degrees Able to turn 360 degrees safely one side only in 4 seconds or less   ° Standing Unsupported, Alternately Place Feet on Step/Stool Able to stand independently and complete 8 steps >20 seconds   ° Standing Unsupported, One Foot in Front Able to plae foot ahead of the other independently and hold 30 seconds   ° Standing on One Leg Tries to lift leg/unable to hold 3 seconds but remains standing independently   ° Total Score 48   °  ° Lumbar Exercises: Aerobic  ° Nustep L4 x6' with PT present to discuss pt status   438 steps  °  ° Knee/Hip Exercises: Seated  ° Long Arc Quad Strengthening;Both;2 sets;10 reps   ° Long Arc Quad Weight 3 lbs.   ° Marching Both;2 sets;10 reps   ° Marching Limitations 3   ° Abduction/Adduction  Strengthening;Both;2 sets;10 reps   ° Abd/Adduction Limitations 3#, hip abduction scissors   ° °  °  ° °  ° ° ° ° ° ° ° ° ° ° ° ° PT Short Term Goals - 11/03/21 1046   ° °  ° PT SHORT TERM GOAL #1  ° Title Pt will be independent with her initial HEP to improve LE strength and balance.   ° Status Achieved   °  ° PT SHORT TERM GOAL #2  ° Title Pt will consistently use her SPC at the new adjusted height to improve her mechanics and safety with ambulation.   ° Status Achieved   ° °  °  ° °  ° ° ° ° PT Long Term Goals - 11/03/21 1046   ° °  ° PT LONG TERM GOAL #1  ° Title Pt will complete the 5x sit to stand in less than 14 sec without the need for UE support to reflect improvements in LE  strength.   ° Status Partially Met   °  ° PT LONG TERM GOAL #2  ° Title Pt will complete TUG in less than 14 sec with LRAD to reflect improvements in her balance.   ° Status Achieved   °  ° PT LONG TERM GOAL #3  ° Title Pt to have a BERG of at least 49/56 to place her at a lower risk of falling.   ° Status Partially Met   48/56  °  ° PT LONG TERM GOAL #4  ° Title Pt will have atleast 4/5 MMT strength in hip abductors to help with single leg stability.   ° Status Achieved   ° °  °  ° °  ° ° ° ° ° ° ° ° Plan - 11/03/21 1041   ° ° Clinical Impression Statement Tracy [name] has met goals for initial referral of unsteady gait and LE weakness, however, she has new impairment of vestibular impairment.  Pt would benefit from being discharged from current episode of outpatient PT.  Sent message to Dr Jordan to request a referral for PT Vestibular Evaluation   if she is in agreement secondary to patient with a change in symptoms. Pt has HEP and will continue to progress with HEP at home and has appointment with eye doctor this afternoon secondary to her double vision. Pt discharged from outpatient PT at this time, pending new potential orders for vestibular rehab.    Personal Factors and Comorbidities Past/Current Experience;Fitness;Age    PT Treatment/Interventions ADLs/Self Care Home Management;Gait training;Stair training;Functional mobility training;Therapeutic activities;Therapeutic exercise;Neuromuscular re-education;Balance training;Patient/family education;Manual techniques;Passive range of motion;Dry needling;Taping;Joint Manipulations;Cryotherapy;Electrical Stimulation;Iontophoresis 48m/ml Dexamethasone;Moist Heat    PT Next Visit Plan Current PT episode discharged on 11/03/21.  Pending new order for Vestibular Rehab.    Recommended Other Services Vestibular PT    Consulted and Agree with Plan of Care Patient             Patient will benefit from skilled therapeutic intervention in order to improve the  following deficits and impairments:  Decreased balance, Decreased endurance, Difficulty walking, Decreased coordination, Decreased safety awareness, Decreased strength  Visit Diagnosis: Unsteadiness on feet  Repeated falls  Muscle weakness (generalized)     Problem List Patient Active Problem List   Diagnosis Date Noted   Tremor of both hands 08/16/2021   Aortic atherosclerosis ([facility] 06/15/2021   Generalized osteoarthritis of multiple sites 12/25/2020   Chronic pain disorder 12/25/2020   Pelvic mass in female    Bilateral lower extremity edema 04/13/2020   Shortness of breath    Anxiety disorder 10/29/2018   CKD (chronic kidney disease), stage III ([facility] 06/27/2018   COPD (chronic obstructive pulmonary disease) ([facility] 05/25/2018   Obesity 03/20/2018   Dyspnea on exertion 11/06/2017   Abnormal stress test 11/06/2017   Chronic diastolic CHF (congestive heart failure) ([facility] [place] 10/03/2017   Chronic combined systolic and diastolic heart failure ([facility] 09/04/2017   Left ventricular dysfunction 07/31/2017   Cataract, nuclear sclerotic senile, bilateral 02/01/2017   Gouty arthritis of toe of left foot 08/09/2016   Hyperuricemia 08/09/2016   Osteoarthritis (arthritis due to wear and tear of joints) 01/14/2014   Coronary artery disease 08/02/2011   Mitral regurgitation 08/02/2011   OVERACTIVE BLADDER 07/02/2010   ACUTE CYSTITIS 05/25/2010   Pure hypercholesterolemia 08/11/2009   PEDAL EDEMA 07/22/2009   GERD 04/11/2008   Essential hypertension 05/30/2007   MYOCARDIAL INFARCTION, HX OF 05/30/2007   Allergic rhinitis 05/30/2007   LOW BACK PAIN 05/30/2007   PHYSICAL THERAPY DISCHARGE SUMMARY  Current episode is being discharged.  Will start new episode and plan of care to focus on Vestibular impairment.  Patient agrees to discharge. Patient goals were partially met. Patient is being discharged due to a change in medical status.   SJuel Burrow PT, DPT 11/03/2021, 12:08 PM  [email or web address] [address]Phone: [phone]  Fax:  [phone] Name: Tracy IVESMRN: [id number] of Birth: 606/27/1947

## 2021-11-04 ENCOUNTER — Telehealth: Payer: Self-pay | Admitting: Neurology

## 2021-11-04 ENCOUNTER — Ambulatory Visit: Payer: Medicare Other | Attending: Family Medicine | Admitting: Rehabilitative and Restorative Service Providers"

## 2021-11-04 ENCOUNTER — Encounter: Payer: Self-pay | Admitting: Rehabilitative and Restorative Service Providers"

## 2021-11-04 ENCOUNTER — Telehealth: Payer: Self-pay | Admitting: Family Medicine

## 2021-11-04 DIAGNOSIS — R2681 Unsteadiness on feet: Secondary | ICD-10-CM | POA: Diagnosis not present

## 2021-11-04 DIAGNOSIS — R42 Dizziness and giddiness: Secondary | ICD-10-CM | POA: Diagnosis not present

## 2021-11-04 DIAGNOSIS — R262 Difficulty in walking, not elsewhere classified: Secondary | ICD-10-CM

## 2021-11-04 DIAGNOSIS — M6281 Muscle weakness (generalized): Secondary | ICD-10-CM | POA: Insufficient documentation

## 2021-11-04 DIAGNOSIS — R296 Repeated falls: Secondary | ICD-10-CM | POA: Insufficient documentation

## 2021-11-04 NOTE — Telephone Encounter (Signed)
Patient is having trouble with double vision. She went to eye doc, they said left eye isn't moving right when looking. Tremors are not any better, they have got worse since her eyes acting up. Wants to know if she needs to be seen

## 2021-11-04 NOTE — Therapy (Signed)
[facility] @ [place], [place], [address] Phone: [phone]   Fax:  [phone]  Physical Therapy Evaluation  Patient Details  Name: Tracy [name] MRN: [id number] Date of Birth: 06/18/1946 Referring Provider (PT): [name], MD   Encounter Date: 11/04/2021   PT End of Session - 11/04/21 1018     Visit Number 1    Date for PT Re-Evaluation 12/24/21    Authorization Type Medicare    Progress Note Due on Visit 10    PT Start Time 0930    PT Stop Time 1010    PT Time Calculation (min) 40 min    Activity Tolerance Patient tolerated treatment well    Behavior During Therapy [facility] for tasks assessed/performed             Past Medical History:  Diagnosis Date   Allergic rhinitis    Allergy    Anxiety    Arthritis    Bronchitis    CAD (coronary artery disease)    1/19 PCI/DES to [place] for ISR, normal EF.    Cancer ([place])    hx of precancerous cells in right breast    Cataract    Cervical dysplasia    unsure of procedure, possible "burning" in her late 74s   CHF (congestive heart failure) ([place])    Echo 06/2019: EF 55-60, elevated LVEDP, normal RV SF, mild MAC, mild MR, trivial TR   Complication of anesthesia    COPD (chronic obstructive pulmonary disease) (HCC)    early    Depression    Dyspnea    with exertion    Family history of adverse reaction to anesthesia    daughter- problems wiht n/v   GERD (gastroesophageal reflux disease)    Gout    Headache(784.0)    Heart murmur    hx of years ago    Hyperlipidemia    Hypertension    Low back pain    Menopausal syndrome    Myocardial infarct ([place]) 2007   hx of   Overactive bladder    PONV (postoperative nausea and vomiting)    Sleep apnea     Past Surgical History:  Procedure Laterality Date   ANGIOPLASTY     stent 2007   BREAST LUMPECTOMY WITH RADIOACTIVE SEED LOCALIZATION Right 04/28/2020   Procedure: RIGHT BREAST LUMPECTOMY X 2   WITH RADIOACTIVE SEED LOCALIZATION;  Surgeon: Donnie Mesa, MD;  Location: [facility];  Service: General;  Laterality: Right;  LMA   CARDIAC CATHETERIZATION     COLONOSCOPY     CORONARY STENT INTERVENTION N/A 11/06/2017   Procedure: CORONARY STENT INTERVENTION;  Surgeon: [name], MD;  Location: [place] [facility];  Service: Cardiovascular;  Laterality: N/A;   LEFT HEART CATH AND CORONARY ANGIOGRAPHY N/A 11/06/2017   Procedure: LEFT HEART CATH AND CORONARY ANGIOGRAPHY;  Surgeon: [name], MD;  Location: [facility];  Service: Cardiovascular;  Laterality: N/A;   LEFT HEART CATH AND CORONARY ANGIOGRAPHY N/A 07/24/2019   Procedure: LEFT HEART CATH AND CORONARY ANGIOGRAPHY;  Surgeon: Burnell Blanks, MD;  Location: [facility];  Service: Cardiovascular;  Laterality: N/A;   RIGHT/LEFT HEART CATH AND CORONARY ANGIOGRAPHY N/A 09/29/2020   Procedure: RIGHT/LEFT HEART CATH AND CORONARY ANGIOGRAPHY;  Surgeon: Belva Crome, MD;  Location: [facility];  Service: Cardiovascular;  Laterality: N/A;   ROBOTIC ASSISTED BILATERAL SALPINGO OOPHERECTOMY Bilateral 11/10/2020   Procedure: XI ROBOTIC ASSISTED  BILATERAL SALPINGO OOPHORECTOMY WITH MINI LAPAROTOMY FOR DRAINAGE;  Surgeon: Lafonda Mosses, MD;  [facility];  Service: Gynecology;  Laterality: Bilateral;  MINI LAP FIRST   TONSILLECTOMY     VULVECTOMY N/A 11/10/2020   Procedure: WIDE EXCISION VULVECTOMY;  Surgeon: Lafonda Mosses, MD;  [facility];  Service: Gynecology;  Laterality: N/A;    There were no vitals filed for this visit.    Subjective Assessment - 11/04/21 0938     Subjective Pt began having severe dizziness on 10/23/2021.  She has been to her PCP and to an eye doctor for follow up.  PCP prescribed Meclizine, which pt reports no relief following.  Pt reports that her eye doctor told her yesterday that she is having double vision when looking towards the right side and  left eye is not tracking to the right side. Pt denies dizziness when she is lying down or rolling over in bed.  She only has dizziness when she is up moving and looking around.    Pertinent History CAD, arthritis    Limitations Walking    Patient Stated Goals To not have double vision and walk without dizziness.    Currently in Pain? Yes    Pain Score 2     Pain Location Knee    Pain Orientation Left    Pain Descriptors / Indicators Aching    Pain Type Chronic pain    Pain Onset More than a month ago    Pain Frequency Intermittent    Aggravating Factors  standing, walking    Pain Score 2    Pain Location Hip    Pain Orientation Left    Pain Descriptors / Indicators Aching    Pain Type Chronic pain    Pain Onset More than a month ago    Pain Frequency Intermittent                [facility] PT Assessment - 11/04/21 0001       Assessment   Medical Diagnosis R42 (ICD-10-CM) - Dizziness    Referring Provider (PT) [name], MD    Onset Date/Surgical Date 10/23/21    Prior Therapy yes, for balance      Precautions   Precautions Fall      Restrictions   Weight Bearing Restrictions No      Balance Screen   Has the patient fallen in the past 6 months Yes    How many times? 17 April 2021 was a fall where pt initially injured herself.   Has the patient had a decrease in activity level because of a fear of falling?  Yes    Is the patient reluctant to leave their home because of a fear of falling?  Yes      [place] Private residence    Living Arrangements Spouse/significant other      Prior Function   Level of [place] Retired    Leisure crafts      Cognition   Overall Cognitive Status Within Functional Limits for tasks assessed      Strength   Overall Strength Comments B hip strength grossly 4/5, B knee strength grossly 4+/5      Transfers   Five time sit to stand comments  14 with limited use of UE                     Vestibular Assessment - 11/04/21  0001       Symptom Behavior   Subjective history of current problem Pt has been having dizziness and double vision since 10/23/21    Type of Dizziness  Diplopia;Lightheadedness    Frequency of Dizziness whenever up walking and moving eyes    Symptom Nature Motion provoked    Aggravating Factors Activity in general    Relieving Factors Head stationary;Lying supine      Oculomotor Exam   Ocular ROM L eye does not pass midline during tracking to right side.    Spontaneous Absent    Smooth Pursuits Comment   L eye does not pass midline when tracking to R side   Saccades Dysmetria    Comment head thrust negative.                Objective measurements completed on examination: See above findings.        Vestibular Treatment/Exercise - 11/04/21 0001       Vestibular Treatment/Exercise   Gaze Exercises Eye/Head Exercise Horizontal;Eye/Head Exercise Vertical;Comment      Eye/Head Exercise Horizontal   Comments Visual tracking towards bilat directions      Eye/Head Exercise Vertical   Comments Visual tracking up/down.    Comment Pencil push ups x10 reps                    PT Education - 11/04/21 1005     Education Details Pt provided with HEP for gaze stabilization    Person(s) Educated Patient    Methods Explanation;Demonstration;Handout    Comprehension Verbalized understanding;Returned demonstration              PT Short Term Goals - 11/04/21 1034       PT SHORT TERM GOAL #1   Title Pt will be independent with initial vestibular HEP.    Time 2    Period Weeks    Status New               PT Long Term Goals - 11/04/21 1035       PT LONG TERM GOAL #1   Title Pt will be independent with advanced HEP.    Time 8    Period Weeks    Status New      PT LONG TERM GOAL #2   Title Pt will complete TUG in 14 sec or less without assistive device to demonstrate improved functional  balance.    Time 8    Period Weeks    Status New      PT LONG TERM GOAL #3   Title Pt to have a BERG of at least 50/56 to place her at a lower risk of falling.    Time 8    Period Weeks    Status New      PT LONG TERM GOAL #4   Title Pt will increase B hip strength to at least 4+/5 to allow her to perform transfers with increased ease.    Time 8    Period Weeks    Status New      PT LONG TERM GOAL #5   Title Pt will be able to track with left eye past midline when looking towards right side.    Time 8    Period Weeks    Status New                    Plan - 11/04/21 1020     Clinical Impression Statement Pt  is a 76 y.o. female who is known to this PT referred to outpatient PT secondary to dizziness. Pt woke up with dizziness on 10/23/21 and the dizziness has not subsided since that time.  On exam, pt was noted to have slow tracking with L eye with tracking to left and when tracking towards right, pts left eye does not track past midline. With saccades, pt is noted to have dysmetria. Pt is having difficulty with walking and has been using her cane at all times with unsteady gait noted.  Prior to onset of dizziness, pt was able to ambulate without assistive device inside her home. Pt presenting with central vestibular symptoms and difficulty tracking with left eye.  Pt continues with some hip weakness, chronic knee pain, and decreased balance. Pt would benefit from skilled PT to address her functional impairments to allow her to be safer and more independent and return to ambulation without complete reliance on assistive device.    Personal Factors and Comorbidities Past/Current Experience;Fitness;Age;Comorbidity 1    Comorbidities OA    Examination-Activity Limitations Stairs;Locomotion Level    Examination-Participation Restrictions Community Activity;Shop    Stability/Clinical Decision Making Evolving/Moderate complexity    Clinical Decision Making Moderate    Rehab  Potential Good    PT Frequency 2x / week    PT Duration 8 weeks    PT Treatment/Interventions ADLs/Self Care Home Management;Gait training;Stair training;Functional mobility training;Therapeutic activities;Therapeutic exercise;Neuromuscular re-education;Balance training;Patient/family education;Manual techniques;Passive range of motion;Dry needling;Taping;Joint Manipulations;Cryotherapy;Electrical Stimulation;Iontophoresis 68m/ml Dexamethasone;Moist Heat;Canalith Repostioning;Vestibular    PT Next Visit Plan assess and progress HEP.  Vestibular, balance, strengthening.    PT Home Exercise Plan For Strengthening: Access Code [email or web address]  For Vestibular: Access Code: [email or web address]   Consulted and Agree with Plan of Care Patient             Patient will benefit from skilled therapeutic intervention in order to improve the following deficits and impairments:  Difficulty walking, Dizziness, Decreased activity tolerance, Pain, Decreased balance, Decreased strength  Visit Diagnosis: Dizziness and giddiness - Plan: PT plan of care cert/re-cert  Unsteadiness on feet - Plan: PT plan of care cert/re-cert  Repeated falls - Plan: PT plan of care cert/re-cert  Muscle weakness (generalized) - Plan: PT plan of care cert/re-cert  Difficulty in walking, not elsewhere classified - Plan: PT plan of care cert/re-cert     Problem List Patient Active Problem List   Diagnosis Date Noted   Tremor of both hands 08/16/2021   Aortic atherosclerosis ([facility] 06/15/2021   Generalized osteoarthritis of multiple sites 12/25/2020   Chronic pain disorder 12/25/2020   Pelvic mass in female    Bilateral lower extremity edema 04/13/2020   Shortness of breath    Anxiety disorder 10/29/2018   CKD (chronic kidney disease), stage III ([facility] 06/27/2018   COPD (chronic obstructive pulmonary disease) ([facility] 05/25/2018   Obesity 03/20/2018   Dyspnea on exertion 11/06/2017   Abnormal stress test 11/06/2017   Chronic diastolic  CHF (congestive heart failure) ([facility] 10/03/2017   Chronic combined systolic and diastolic heart failure ([facility] 09/04/2017   Left ventricular dysfunction 07/31/2017   Cataract, nuclear sclerotic senile, bilateral 02/01/2017   Gouty arthritis of toe of left foot 08/09/2016   Hyperuricemia 08/09/2016   Osteoarthritis (arthritis due to wear and tear of joints) 01/14/2014   Coronary artery disease 08/02/2011   Mitral regurgitation 08/02/2011   OVERACTIVE BLADDER 07/02/2010   ACUTE CYSTITIS 05/25/2010   Pure hypercholesterolemia 08/11/2009   PEDAL EDEMA 07/22/2009   GERD 04/11/2008  Essential hypertension 05/30/2007   MYOCARDIAL INFARCTION, HX OF 05/30/2007   Allergic rhinitis 05/30/2007   LOW BACK PAIN 05/30/2007    Juel Burrow, PT, DPT 11/04/2021, 10:43 AM  [facility] @ [place], [place], [address] Phone: [phone]   Fax:  [phone]  Name: KAMARIYAH TIMBERLAKE MRN: [id number] Date of Birth: 02/15/1946

## 2021-11-04 NOTE — Patient Instructions (Signed)
Access Code: [email or web address] URL: [email or web address] Date: 11/04/2021 Prepared by: Shelby Dubin Devesh Monforte  Exercises Seated Horizontal Smooth Pursuit - 1-2 x daily - 7 x weekly - 2 sets - 10 reps Seated Vertical Smooth Pursuit - 1-2 x daily - 7 x weekly - 2 sets - 10 reps Seated Proximal-Distal Smooth Pursuit - 1-2 x daily - 7 x weekly - 2 sets - 10 reps Seated Horizontal Saccades - 1-2 x daily - 7 x weekly - 2 sets - 10 reps

## 2021-11-04 NOTE — Telephone Encounter (Signed)
Pt called per DPR left a voice mail that we need her notes from the eye Dr for Dr Tat to review,

## 2021-11-04 NOTE — Telephone Encounter (Signed)
Pt went to [facility] yesterday for double vision and requested a referral to ENT she thought E is for eyes. I called pt back and  after explaining to pt ENT is ears, nose and throat she would like a referral   to ENT for vertigo. Pt has medicare

## 2021-11-04 NOTE — Telephone Encounter (Signed)
Patient returned call and stated she's requested the notes to be sent from [facility] to Dr. Carles Collet for review.

## 2021-11-05 NOTE — Telephone Encounter (Signed)
Referral placed.

## 2021-11-08 ENCOUNTER — Telehealth: Payer: Self-pay | Admitting: Family Medicine

## 2021-11-08 NOTE — Telephone Encounter (Signed)
Spoke with patient to schedule Medicare Annual Wellness Visit (AWV) either virtually or in office.   Patient declined stating she does not want to participate in this.  She was also asking about her referral to an ENT.  I saw referral was placed to [facility] ENT I gave patient [facility] ENT phone #  Last AWV ;05/24/18 please schedule at anytime with [facility] [place] 1 or 2   This should be a 45 minute visit.

## 2021-11-09 ENCOUNTER — Encounter: Payer: Self-pay | Admitting: Rehabilitative and Restorative Service Providers"

## 2021-11-09 ENCOUNTER — Ambulatory Visit: Payer: Medicare Other | Admitting: Rehabilitative and Restorative Service Providers"

## 2021-11-09 ENCOUNTER — Other Ambulatory Visit: Payer: Self-pay

## 2021-11-09 DIAGNOSIS — M6281 Muscle weakness (generalized): Secondary | ICD-10-CM

## 2021-11-09 DIAGNOSIS — R296 Repeated falls: Secondary | ICD-10-CM | POA: Diagnosis not present

## 2021-11-09 DIAGNOSIS — R42 Dizziness and giddiness: Secondary | ICD-10-CM

## 2021-11-09 DIAGNOSIS — R2681 Unsteadiness on feet: Secondary | ICD-10-CM

## 2021-11-09 DIAGNOSIS — R262 Difficulty in walking, not elsewhere classified: Secondary | ICD-10-CM

## 2021-11-09 NOTE — Therapy (Signed)
[facility] @ [place], [place], [address] Phone: [phone]   Fax:  [phone]  Physical Therapy Treatment  Patient Details  Name: Tracy [name] MRN: [id number] Date of Birth: 02-07-1946 Referring Provider (PT): [name], MD   Encounter Date: 11/09/2021   PT End of Session - 11/09/21 0932     Visit Number 2    Date for PT Re-Evaluation 12/24/21    Authorization Type Medicare    Progress Note Due on Visit 10    PT Start Time 0928    PT Stop Time 1010    PT Time Calculation (min) 42 min    Activity Tolerance Patient tolerated treatment well    Behavior During Therapy [facility] for tasks assessed/performed             Past Medical History:  Diagnosis Date   Allergic rhinitis    Allergy    Anxiety    Arthritis    Bronchitis    CAD (coronary artery disease)    1/19 PCI/DES to [facility] for ISR, normal EF.    Cancer ([place])    hx of precancerous cells in right breast    Cataract    Cervical dysplasia    unsure of procedure, possible "burning" in her late 81s   CHF (congestive heart failure) ([place])    Echo 06/2019: EF 55-60, elevated LVEDP, normal RV SF, mild MAC, mild MR, trivial TR   Complication of anesthesia    COPD (chronic obstructive pulmonary disease) (HCC)    early    Depression    Dyspnea    with exertion    Family history of adverse reaction to anesthesia    daughter- problems wiht n/v   GERD (gastroesophageal reflux disease)    Gout    Headache(784.0)    Heart murmur    hx of years ago    Hyperlipidemia    Hypertension    Low back pain    Menopausal syndrome    Myocardial infarct ([place]) 2007   hx of   Overactive bladder    PONV (postoperative nausea and vomiting)    Sleep apnea     Past Surgical History:  Procedure Laterality Date   ANGIOPLASTY     stent 2007   BREAST LUMPECTOMY WITH RADIOACTIVE SEED LOCALIZATION Right 04/28/2020   Procedure: RIGHT BREAST LUMPECTOMY X 2   WITH RADIOACTIVE SEED LOCALIZATION;  Surgeon: Donnie Mesa, MD;  Location: [facility];  Service: General;  Laterality: Right;  LMA   CARDIAC CATHETERIZATION     COLONOSCOPY     CORONARY STENT INTERVENTION N/A 11/06/2017   Procedure: CORONARY STENT INTERVENTION;  Surgeon: [name], MD;  Location: [facility];  Service: Cardiovascular;  Laterality: N/A;   LEFT HEART CATH AND CORONARY ANGIOGRAPHY N/A 11/06/2017   Procedure: LEFT HEART CATH AND CORONARY ANGIOGRAPHY;  Surgeon: [name], MD;  Location: [facility] [facility];  Service: Cardiovascular;  Laterality: N/A;   LEFT HEART CATH AND CORONARY ANGIOGRAPHY N/A 07/24/2019   Procedure: LEFT HEART CATH AND CORONARY ANGIOGRAPHY;  Surgeon: Burnell Blanks, MD;  Location: [facility];  Service: Cardiovascular;  Laterality: N/A;   RIGHT/LEFT HEART CATH AND CORONARY ANGIOGRAPHY N/A 09/29/2020   Procedure: RIGHT/LEFT HEART CATH AND CORONARY ANGIOGRAPHY;  Surgeon: Belva Crome, MD;  Location: [facility];  Service: Cardiovascular;  Laterality: N/A;   ROBOTIC ASSISTED BILATERAL SALPINGO OOPHERECTOMY Bilateral 11/10/2020   Procedure: XI ROBOTIC ASSISTED  BILATERAL SALPINGO OOPHORECTOMY WITH MINI LAPAROTOMY FOR DRAINAGE;  Surgeon: Lafonda Mosses, MD;  [facility];  Service: Gynecology;  Laterality: Bilateral;  MINI LAP FIRST   TONSILLECTOMY     VULVECTOMY N/A 11/10/2020   Procedure: WIDE EXCISION VULVECTOMY;  Surgeon: Lafonda Mosses, MD;  [facility];  Service: Gynecology;  Laterality: N/A;    There were no vitals filed for this visit.   Subjective Assessment - 11/09/21 0931     Subjective Pt reports that her PCP gave her a referral to the ENT to follow up with her dizziness. She reports that she is most dizzy in the AM.    Pertinent History CAD, arthritis    Patient Stated Goals To not have double vision and walk without dizziness.    Currently in Pain? Yes    Pain Score 2      Pain Location Knee    Pain Orientation Left    Pain Descriptors / Indicators Aching    Pain Type Chronic pain                               [facility] Adult PT Treatment/Exercise - 11/09/21 0001       Lumbar Exercises: Aerobic   Nustep L4 x6' with PT present to discuss pt status             Vestibular Treatment/Exercise - 11/09/21 0001       Eye/Head Exercise Horizontal   Comments Visual tracking towards bilat directions x10 reps.  Walking down hallway with vertical head turns and SPC.      Eye/Head Exercise Vertical   Comments Visual tracking up/down x10.  Horizontal head turns down hallway with [place].    Comment Pencil push ups 2x10 reps.  Tracking alphabet with eyes.                      PT Short Term Goals - 11/09/21 1023       PT SHORT TERM GOAL #1   Title Pt will be independent with initial vestibular HEP.    Status On-going               PT Long Term Goals - 11/04/21 1035       PT LONG TERM GOAL #1   Title Pt will be independent with advanced HEP.    Time 8    Period Weeks    Status New      PT LONG TERM GOAL #2   Title Pt will complete TUG in 14 sec or less without assistive device to demonstrate improved functional balance.    Time 8    Period Weeks    Status New      PT LONG TERM GOAL #3   Title Pt to have a BERG of at least 50/56 to place her at a lower risk of falling.    Time 8    Period Weeks    Status New      PT LONG TERM GOAL #4   Title Pt will increase B hip strength to at least 4+/5 to allow her to perform transfers with increased ease.    Time 8    Period Weeks    Status New      PT LONG TERM GOAL #5   Title Pt will be able to track with left eye past midline when looking towards right side.    Time 8  Period Weeks    Status New                   Plan - 11/09/21 1015     Clinical Impression Statement Pt admits to not getting much out of bed over the weekend.  Advised pt that she  needs to not stay in bed all weekend, but she needs to continue to get up and perform her HEP and move her head to activate her vestibular system.  Pt educated that for HEP, she can cover her R eye to work on tracking with only her left eye some throughout the day, secondary to her L eye having decreased A/ROM. Pt educated to follow up with ENT to get a consultation if they have not called her to schedule by the end of the day.  Pt verbalizes her understanding.    Personal Factors and Comorbidities Past/Current Experience;Fitness;Age;Comorbidity 1    Comorbidities OA    PT Treatment/Interventions ADLs/Self Care Home Management;Gait training;Stair training;Functional mobility training;Therapeutic activities;Therapeutic exercise;Neuromuscular re-education;Balance training;Patient/family education;Manual techniques;Passive range of motion;Dry needling;Taping;Joint Manipulations;Cryotherapy;Electrical Stimulation;Iontophoresis 50m/ml Dexamethasone;Moist Heat;Canalith Repostioning;Vestibular    PT Next Visit Plan assess and progress HEP.  Vestibular, balance, strengthening.    Consulted and Agree with Plan of Care Patient             Patient will benefit from skilled therapeutic intervention in order to improve the following deficits and impairments:  Difficulty walking, Dizziness, Decreased activity tolerance, Pain, Decreased balance, Decreased strength  Visit Diagnosis: Dizziness and giddiness  Unsteadiness on feet  Repeated falls  Muscle weakness (generalized)  Difficulty in walking, not elsewhere classified     Problem List Patient Active Problem List   Diagnosis Date Noted   Tremor of both hands 08/16/2021   Aortic atherosclerosis ([facility] 06/15/2021   Generalized osteoarthritis of multiple sites 12/25/2020   Chronic pain disorder 12/25/2020   Pelvic mass in female    Bilateral lower extremity edema 04/13/2020   Shortness of breath    Anxiety disorder 10/29/2018   CKD (chronic  kidney disease), stage III ([place] 06/27/2018   COPD (chronic obstructive pulmonary disease) ([place] 05/25/2018   Obesity 03/20/2018   Dyspnea on exertion 11/06/2017   Abnormal stress test 11/06/2017   Chronic diastolic CHF (congestive heart failure) ([facility] [place] 10/03/2017   Chronic combined systolic and diastolic heart failure ([facility] 09/04/2017   Left ventricular dysfunction 07/31/2017   Cataract, nuclear sclerotic senile, bilateral 02/01/2017   Gouty arthritis of toe of left foot 08/09/2016   Hyperuricemia 08/09/2016   Osteoarthritis (arthritis due to wear and tear of joints) 01/14/2014   Coronary artery disease 08/02/2011   Mitral regurgitation 08/02/2011   OVERACTIVE BLADDER 07/02/2010   ACUTE CYSTITIS 05/25/2010   Pure hypercholesterolemia 08/11/2009   PEDAL EDEMA 07/22/2009   GERD 04/11/2008   Essential hypertension 05/30/2007   MYOCARDIAL INFARCTION, HX OF 05/30/2007   Allergic rhinitis 05/30/2007   LOW BACK PAIN 05/30/2007    SJuel Burrow PT, DPT 11/09/2021, 10:24 AM  [facility] [place] [address]: [phone]  Fax:  [phone] Name: BJAZLENE BARESMRN: [id number] of Birth: [birth year]

## 2021-11-11 ENCOUNTER — Encounter: Payer: Self-pay | Admitting: Rehabilitative and Restorative Service Providers"

## 2021-11-11 ENCOUNTER — Other Ambulatory Visit: Payer: Self-pay

## 2021-11-11 ENCOUNTER — Ambulatory Visit: Payer: Medicare Other | Admitting: Rehabilitative and Restorative Service Providers"

## 2021-11-11 DIAGNOSIS — M6281 Muscle weakness (generalized): Secondary | ICD-10-CM | POA: Diagnosis not present

## 2021-11-11 DIAGNOSIS — R2681 Unsteadiness on feet: Secondary | ICD-10-CM

## 2021-11-11 DIAGNOSIS — R296 Repeated falls: Secondary | ICD-10-CM | POA: Diagnosis not present

## 2021-11-11 DIAGNOSIS — R262 Difficulty in walking, not elsewhere classified: Secondary | ICD-10-CM

## 2021-11-11 DIAGNOSIS — R42 Dizziness and giddiness: Secondary | ICD-10-CM

## 2021-11-11 NOTE — Therapy (Signed)
[facility] @ [place], [place], [address] Phone: [phone]   Fax:  [phone]  Physical Therapy Treatment  Patient Details  Name: Tracy [name] MRN: [id number] Date of Birth: 1945-10-19 Referring Provider (PT): [name], MD   Encounter Date: 11/11/2021   PT End of Session - 11/11/21 1037     Visit Number 3    Date for PT Re-Evaluation 12/24/21    Authorization Type Medicare    Progress Note Due on Visit 10    PT Start Time 9242    PT Stop Time 1055    PT Time Calculation (min) 40 min    Activity Tolerance Patient tolerated treatment well    Behavior During Therapy [facility] for tasks assessed/performed             Past Medical History:  Diagnosis Date   Allergic rhinitis    Allergy    Anxiety    Arthritis    Bronchitis    CAD (coronary artery disease)    1/19 PCI/DES to [place] for ISR, normal EF.    Cancer ([place])    hx of precancerous cells in right breast    Cataract    Cervical dysplasia    unsure of procedure, possible "burning" in her late 66s   CHF (congestive heart failure) ([place])    Echo 06/2019: EF 55-60, elevated LVEDP, normal RV SF, mild MAC, mild MR, trivial TR   Complication of anesthesia    COPD (chronic obstructive pulmonary disease) (H[facility])    early    Depression    Dyspnea    with exertion    Family history of adverse reaction to anesthesia    daughter- problems wiht n/v   GERD (gastroesophageal reflux disease)    Gout    Headache(784.0)    Heart murmur    hx of years ago    Hyperlipidemia    Hypertension    Low back pain    Menopausal syndrome    Myocardial infarct ([place]) 2007   hx of   Overactive bladder    PONV (postoperative nausea and vomiting)    Sleep apnea     Past Surgical History:  Procedure Laterality Date   ANGIOPLASTY     stent 2007   BREAST LUMPECTOMY WITH RADIOACTIVE SEED LOCALIZATION Right 04/28/2020   Procedure: RIGHT BREAST LUMPECTOMY X 2   WITH RADIOACTIVE SEED LOCALIZATION;  Surgeon: Donnie Mesa, MD;  Location: [facility];  Service: General;  Laterality: Right;  LMA   CARDIAC CATHETERIZATION     COLONOSCOPY     CORONARY STENT INTERVENTION N/A 11/06/2017   Procedure: CORONARY STENT INTERVENTION;  Surgeon: [name], MD;  Location: [facility];  Service: Cardiovascular;  Laterality: N/A;   LEFT HEART CATH AND CORONARY ANGIOGRAPHY N/A 11/06/2017   Procedure: LEFT HEART CATH AND CORONARY ANGIOGRAPHY;  Surgeon: [name], MD;  Location: [facility];  Service: Cardiovascular;  Laterality: N/A;   LEFT HEART CATH AND CORONARY ANGIOGRAPHY N/A 07/24/2019   Procedure: LEFT HEART CATH AND CORONARY ANGIOGRAPHY;  Surgeon: Burnell Blanks, MD;  Location: [facility];  Service: Cardiovascular;  Laterality: N/A;   RIGHT/LEFT HEART CATH AND CORONARY ANGIOGRAPHY N/A 09/29/2020   Procedure: RIGHT/LEFT HEART CATH AND CORONARY ANGIOGRAPHY;  Surgeon: Belva Crome, MD;  Location: [facility];  Service: Cardiovascular;  Laterality: N/A;   ROBOTIC ASSISTED BILATERAL SALPINGO OOPHERECTOMY Bilateral 11/10/2020   Procedure: XI ROBOTIC ASSISTED  BILATERAL SALPINGO OOPHORECTOMY WITH MINI LAPAROTOMY FOR DRAINAGE;  Surgeon: Lafonda Mosses, MD;  Location: [facility];  Service: Gynecology;  Laterality: Bilateral;  MINI LAP FIRST   TONSILLECTOMY     VULVECTOMY N/A 11/10/2020   Procedure: WIDE EXCISION VULVECTOMY;  Surgeon: Lafonda Mosses, MD;  Location: [facility];  Service: Gynecology;  Laterality: N/A;    There were no vitals filed for this visit.   Subjective Assessment - 11/11/21 1020     Subjective Pt reports that she received a call from her optomostrist, and they are referring her back to see Dr Tat.  She has an appointment Monday afternoon with Dr Tat. Pt reports that she had a near syncopal episode when doing her HEP and that she had increased tremors following.    Pertinent History CAD,  arthritis    Patient Stated Goals To not have double vision and walk without dizziness.    Currently in Pain? Yes    Pain Score 5     Pain Location Head    Pain Orientation Left    Pain Descriptors / Indicators Aching    Pain Type Chronic pain                [facility] PT Assessment - 11/11/21 0001       Observation/Other Assessments   Other Surveys  Dizziness Handicap Inventory (DHI)    Dizziness Handicap Inventory (DHI)  DHI Total Score: 60 / 100  Physical Score: 12 / 28  Emotional Score: 22 / 36  Functional Score: 26 / 36                           [facility] Adult PT Treatment/Exercise - 11/11/21 0001       Ambulation/Gait   Gait Comments Ambulation down hallway and back with SPC and SBA.      Timed Up and Go Test   TUG Normal TUG    Normal TUG (seconds) 26.6   with SPC     Lumbar Exercises: Aerobic   Nustep L3 x6' with PT present to discuss pt status      Knee/Hip Exercises: Standing   Other Standing Knee Exercises Alt LE toe taps on step stool with SPC support 2x10 each             Vestibular Treatment/Exercise - 11/11/21 0001       X1 Viewing Horizontal   Comments head turns x10      Eye/Head Exercise Horizontal   Comments Head turns x10      Eye/Head Exercise Vertical   Comment Pencil push ups 2x10 reps.  Tracking alphabet with eyes.                      PT Short Term Goals - 11/11/21 1209       PT SHORT TERM GOAL #1   Title Pt will be independent with initial vestibular HEP.    Status Partially Met               PT Long Term Goals - 11/04/21 1035       PT LONG TERM GOAL #1   Title Pt will be independent with advanced HEP.    Time 8    Period Weeks    Status New      PT LONG TERM GOAL #2   Title Pt will complete TUG in 14 sec or less without assistive device to demonstrate improved functional  balance.    Time 8    Period Weeks    Status New      PT LONG TERM GOAL #3   Title Pt to have a BERG of at least  50/56 to place her at a lower risk of falling.    Time 8    Period Weeks    Status New      PT LONG TERM GOAL #4   Title Pt will increase B hip strength to at least 4+/5 to allow her to perform transfers with increased ease.    Time 8    Period Weeks    Status New      PT LONG TERM GOAL #5   Title Pt will be able to track with left eye past midline when looking towards right side.    Time 8    Period Weeks    Status New                   Plan - 11/11/21 1151     Clinical Impression Statement Ms Newman continues to be limited by increased dizziness.  Pt reported one near syncopal episode since last visit.  Recommended that pt go to ED or MD for assessment if she has another near syncopal episode again, she verbalizes her understanding. Pt follows up with Dr Carles Collet, her neurologist on Monday for further assessment of her dizziness. Pt continues to require skilled PT to progress towards goal related activities and decreased dizziness.    Personal Factors and Comorbidities Past/Current Experience;Fitness;Age;Comorbidity 1    Comorbidities OA    PT Treatment/Interventions ADLs/Self Care Home Management;Gait training;Stair training;Functional mobility training;Therapeutic activities;Therapeutic exercise;Neuromuscular re-education;Balance training;Patient/family education;Manual techniques;Passive range of motion;Dry needling;Taping;Joint Manipulations;Cryotherapy;Electrical Stimulation;Iontophoresis 21m/ml Dexamethasone;Moist Heat;Canalith Repostioning;Vestibular    PT Next Visit Plan assess and progress HEP.  Vestibular, balance, strengthening.    Consulted and Agree with Plan of Care Patient             Patient will benefit from skilled therapeutic intervention in order to improve the following deficits and impairments:  Difficulty walking, Dizziness, Decreased activity tolerance, Pain, Decreased balance, Decreased strength  Visit Diagnosis: Dizziness and  giddiness  Unsteadiness on feet  Repeated falls  Muscle weakness (generalized)  Difficulty in walking, not elsewhere classified     Problem List Patient Active Problem List   Diagnosis Date Noted   Tremor of both hands 08/16/2021   Aortic atherosclerosis ([place] 06/15/2021   Generalized osteoarthritis of multiple sites 12/25/2020   Chronic pain disorder 12/25/2020   Pelvic mass in female    Bilateral lower extremity edema 04/13/2020   Shortness of breath    Anxiety disorder 10/29/2018   CKD (chronic kidney disease), stage III ([place] 06/27/2018   COPD (chronic obstructive pulmonary disease) ([place] 05/25/2018   Obesity 03/20/2018   Dyspnea on exertion 11/06/2017   Abnormal stress test 11/06/2017   Chronic diastolic CHF (congestive heart failure) ([facility] 10/03/2017   Chronic combined systolic and diastolic heart failure ([facility] 09/04/2017   Left ventricular dysfunction 07/31/2017   Cataract, nuclear sclerotic senile, bilateral 02/01/2017   Gouty arthritis of toe of left foot 08/09/2016   Hyperuricemia 08/09/2016   Osteoarthritis (arthritis due to wear and tear of joints) 01/14/2014   Coronary artery disease 08/02/2011   Mitral regurgitation 08/02/2011   OVERACTIVE BLADDER 07/02/2010   ACUTE CYSTITIS 05/25/2010   Pure hypercholesterolemia 08/11/2009   PEDAL EDEMA 07/22/2009   GERD 04/11/2008   Essential hypertension 05/30/2007   MYOCARDIAL INFARCTION, HX OF  05/30/2007   Allergic rhinitis 05/30/2007   LOW BACK PAIN 05/30/2007    Juel Burrow, PT, DPT 11/11/2021, 12:12 PM  [facility] @ [facility] [employer] [place], [place], [address] Phone: [phone]   Fax:  [phone]  Name: Tracy [name] MRN: [id number] Date of Birth: [birth year]

## 2021-11-12 NOTE — Progress Notes (Signed)
Assessment/Plan:    1.  Diplopia  -Optometry notes report possible INO, but described 6th nerve palsy.  I did not see either today, but patient reports that she did not have symptoms on my exam (but reports she is still having symptoms intermittently).  -We will do MRI of the brain just to make sure we are not missing anything.  This is new since her last MRI of the brain.  In addition, she does appear to have some right facial droop today.  -Patient already on aspirin, 81 mg daily.  She will continue on that.  -Carotid ultrasound was done April 21, 2021 and was negative  -CTA brain was done October, 2022 and was negative.  -I will send her to ophthalmology for more thorough examination. Dr Herbert Deaner  -check AchR Ab, although the diagnosis of MG is low on my list of differential.  Also going to check hemoglobin A1c.  Fasting blood sugars were just slightly elevated.  2.  Tremor  -Patient has developed a rest quality to the tremor.  She really is not very bradykinetic, but decided to go ahead and do a DaTscan.  3.  Vertigo  -Work-up for central causes has been negative.  -Primary care has started her on meclizine.  -Sounds like she is currently in vestibular rehab.  -She has a referral for ENT, but states that the ENT office told her that they did not have a referral.  I printed the referral out (I can see that the primary care has faxed it twice).  I gave the referral to the patient and she can take it to the ENT office hopefully to schedule an appointment.  Subjective:   [name] was seen today in follow up.  Patient is with her neighbor, Threasa Beards, who supplements the history.  I last saw her in September, but it was primarily for tremor.  At that point in time, symptoms are most consistent with essential tremor, but she really had not had symptoms long enough to formally make that diagnosis.  Patient was not interested in medication at the time.  She also had vertigo and we  ordered a CTA of the brain which was negative.  Since that time, patient has developed a new symptom for which she is coming for care.  She went to her optometrist on January 18 (note sent to me on January 25) with complaints of double vision that had started 5 days prior to seeing the optometrist (she states that it started Jan 7).  Notes were reviewed.  It does not appear that the patient had anything but an external examination completed and refraction done.  I do not see that the fundus was examined where that ocular was recorded.  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  She notes it when looking to the right.  She is doing some exercises in PT.  Vision ok to the right if she shuts the right eye but not if she shuts the L eye.  States that her "eyeballs were hurting" this weekend.   States that she had tremor in the L hand when it started (worse than baseline).  States that she has been trying to get appt for vertigo for ENT.  She was told by the ENT office that they did not have a referral.  The patient  questioned that because she stated that the ENT office was actually the ones to reach out to her and she wondered how they would have known to reach out to her if they did not have referral.  Patient currently not driving because of the diplopia and vertigo.      ALLERGIES:   Allergies  Allergen Reactions   Erythromycin Rash    But isn't certain   Sulfamethoxazole Rash    CURRENT MEDICATIONS:  Current Meds  Medication Sig   acetaminophen (TYLENOL) 500 MG tablet Take 500-1,000 mg by mouth every 6 (six) hours as needed for headache.    albuterol (VENTOLIN HFA) 108 (90 Base) MCG/ACT inhaler Inhale 2 puffs into the lungs every 4 (four) hours as needed for wheezing. (Patient taking differently: Inhale 2 puffs into the lungs as  needed for wheezing.)   allopurinol (ZYLOPRIM) 100 MG tablet TAKE 1 TABLET BY MOUTH EVERY DAY   amLODipine (NORVASC) 5 MG tablet TAKE 1 TABLET (5 MG TOTAL) BY MOUTH DAILY.   ANORO ELLIPTA 62.5-25 MCG/INH AEPB INHALE 1 PUFF BY MOUTH EVERY DAY   APPLE CIDER VINEGAR PO Take 2,400 mg by mouth daily.   ASPERCREME LIDOCAINE EX Apply 1 application topically daily as needed (pain).   aspirin EC 81 MG tablet Take 81 mg by mouth daily.   Camphor-Eucalyptus-Menthol (VICKS VAPORUB EX) Apply 1 application topically daily as needed (congestion).   cetirizine (ZYRTEC) 10 MG tablet Take 10 mg by mouth daily.   Cholecalciferol (VITAMIN D3) 50 MCG (2000 UT) TABS Take 4,000 Units by mouth daily.    clopidogrel (PLAVIX) 75 MG tablet TAKE 1 TABLET BY MOUTH DAILY WITH BREAKFAST.   Cyanocobalamin (VITAMIN B-12) 5000 MCG SUBL Place 5,000 mcg under the tongue daily.    ezetimibe (ZETIA) 10 MG tablet TAKE 1 TABLET BY MOUTH DAILY   fluticasone (FLONASE) 50 MCG/ACT nasal spray SPRAY 2 SPRAYS INTO EACH NOSTRIL EVERY DAY (Patient taking differently: Place 2 sprays into both nostrils daily.)   guaiFENesin (MUCINEX) 600 MG 12 hr tablet Take 600 mg by mouth 2 (two) times daily as needed (congestion).   isosorbide mononitrate (IMDUR) 30 MG 24 hr tablet Take 1 tablet (30 mg total) by mouth daily.   losartan (COZAAR) 100 MG tablet TAKE 1 TABLET BY MOUTH EVERY DAY   meclizine (ANTIVERT) 25 MG tablet Take 1 tablet (25 mg total) by mouth 2 (two) times daily as needed for dizziness.   metoprolol succinate (TOPROL-XL) 100 MG 24 hr tablet TAKE 1 TABLET BY MOUTH DAILY. TAKE WITH OR IMMEDIATELY FOLLOWING A MEAL.   Misc Natural Products (TART CHERRY ADVANCED) CAPS Take 1,000 capsules by mouth daily.    oxymetazoline (AFRIN) 0.05 % nasal spray Place 1 spray into both nostrils 2 (two) times daily as needed for congestion.   pantoprazole (PROTONIX) 40 MG tablet TAKE 1 TABLET BY MOUTH EVERY DAY   potassium chloride SA (KLOR-CON) 20 MEQ tablet  Take 20 mEq by mouth daily.   Simethicone (GAS-X PO) Take 2 tablets by mouth daily as needed (gas).   triamcinolone cream (KENALOG) 0.1 % Apply 1 application topically 2 (two) times daily.   Current Facility-Administered Medications for the 11/15/21 encounter (Office Visit) with Sinda Leedom, Eustace Quail, DO  Medication   0.9 %  sodium chloride infusion      Objective:    PHYSICAL EXAMINATION:    VITALS:   Vitals:   11/15/21 1423  BP: 119/73  Pulse: 93  SpO2: 96%  Weight: 193 lb 12.8 oz (  87.9 kg)  Height: 5\' 5"  (1.651 m)    GEN:  The patient appears stated age and is in NAD. HEENT:  Normocephalic, atraumatic.  The mucous membranes are moist. The superficial temporal arteries are without ropiness or tenderness. CV:  RRR Lungs:  CTAB Neck/HEME:  There are no carotid bruits bilaterally.  Neurological examination:  Orientation: The patient is alert and oriented x3. Cranial nerves: There appears to be right facial droop, but when she smiles the nasolabial folds are symmetric.  Her extraocular muscles were all intact today and I was not able to elicit the diplopia today.   The speech is fluent and clear. Soft palate rises symmetrically and there is no tongue deviation. Hearing is intact to conversational tone. Sensation: Sensation is intact to light touch throughout Motor: Strength is 5/5 in the bilateral upper and lower extremities.  Movement examination: Tone: There is normal tone in the UE/LE Abnormal movements: there is rare LUE rest tremor Coordination:  There is no decremation with RAM's,  Gait and Station: The patient has difficulty arising out of a deep-seated chair without the use of the hands.  Patient is off balance even with her cane.  She actually does better when the examiner holds her arm (just released her and is not really leaning on the examiner).  She does not shuffle. I have reviewed and interpreted the following labs independently   Chemistry      Component Value  Date/Time   NA 139 10/26/2021 1635   NA 139 02/03/2021 0829   K 4.3 10/26/2021 1635   CL 105 10/26/2021 1635   CO2 27 10/26/2021 1635   BUN 20 10/26/2021 1635   BUN 25 02/03/2021 0829   CREATININE 1.07 10/26/2021 1635   CREATININE 1.05 (H) 09/13/2016 0738      Component Value Date/Time   CALCIUM 9.1 10/26/2021 1635   ALKPHOS 89 03/16/2021 0828   AST 24 03/16/2021 0828   ALT 22 03/16/2021 0828   BILITOT 0.4 03/16/2021 0828      Lab Results  Component Value Date   WBC 9.5 10/26/2021   HGB 13.5 10/26/2021   HCT 42.5 10/26/2021   MCV 87.1 10/26/2021   PLT 251.0 10/26/2021   Lab Results  Component Value Date   TSH 1.71 04/20/2021     Chemistry      Component Value Date/Time   NA 139 10/26/2021 1635   NA 139 02/03/2021 0829   K 4.3 10/26/2021 1635   CL 105 10/26/2021 1635   CO2 27 10/26/2021 1635   BUN 20 10/26/2021 1635   BUN 25 02/03/2021 0829   CREATININE 1.07 10/26/2021 1635   CREATININE 1.05 (H) 09/13/2016 0738      Component Value Date/Time   CALCIUM 9.1 10/26/2021 1635   ALKPHOS 89 03/16/2021 0828   AST 24 03/16/2021 0828   ALT 22 03/16/2021 0828   BILITOT 0.4 03/16/2021 0828     Lab Results  Component Value Date   HGBA1C 5.9 12/25/2020       Total time spent on today's visit was 42 minutes, including both face-to-face time and nonface-to-face time.  Time included that spent on review of records (prior notes available to me/labs/imaging if pertinent), discussing treatment and goals, answering patient's questions and coordinating care.  Cc:  [name], [name], MD

## 2021-11-15 ENCOUNTER — Telehealth: Payer: Self-pay

## 2021-11-15 ENCOUNTER — Other Ambulatory Visit (INDEPENDENT_AMBULATORY_CARE_PROVIDER_SITE_OTHER): Payer: Medicare Other

## 2021-11-15 ENCOUNTER — Ambulatory Visit (INDEPENDENT_AMBULATORY_CARE_PROVIDER_SITE_OTHER): Payer: Medicare Other | Admitting: Neurology

## 2021-11-15 ENCOUNTER — Other Ambulatory Visit: Payer: Self-pay

## 2021-11-15 VITALS — BP 119/73 | HR 93 | Ht 65.0 in | Wt 193.8 lb

## 2021-11-15 DIAGNOSIS — R251 Tremor, unspecified: Secondary | ICD-10-CM

## 2021-11-15 DIAGNOSIS — R7309 Other abnormal glucose: Secondary | ICD-10-CM | POA: Diagnosis not present

## 2021-11-15 DIAGNOSIS — H532 Diplopia: Secondary | ICD-10-CM

## 2021-11-15 DIAGNOSIS — R739 Hyperglycemia, unspecified: Secondary | ICD-10-CM | POA: Diagnosis not present

## 2021-11-15 DIAGNOSIS — R42 Dizziness and giddiness: Secondary | ICD-10-CM

## 2021-11-15 DIAGNOSIS — R29810 Facial weakness: Secondary | ICD-10-CM

## 2021-11-15 NOTE — Patient Instructions (Signed)
A referral to [facility] has been placed for your MRI someone will contact you directly to schedule your appt. They are located at [address] Please contact them directly by calling [phone] with any questions regarding your referral.  Your provider has requested that you have labwork completed today. The lab is located on the Second floor at [address], within the [facility] office. When you get off the elevator, turn right and go in the [facility] [address]; the first brown door on the left.  Tell the ladies behind the desk that you are there for lab work. If you are not called within 15 minutes please check with the front desk.   Once you complete your labs you are free to go. You will receive a call or message via MyChart with your lab results.        

## 2021-11-15 NOTE — Telephone Encounter (Signed)
Faxed Dat Scan Faxed Opthalmology referral to [facility] at [phone] Printed MRI and put in box for GI

## 2021-11-16 LAB — HEMOGLOBIN A1C: Hgb A1c MFr Bld: 6.2 % (ref 4.6–6.5)

## 2021-11-19 ENCOUNTER — Other Ambulatory Visit: Payer: Self-pay | Admitting: Physician Assistant

## 2021-11-19 ENCOUNTER — Other Ambulatory Visit: Payer: Self-pay | Admitting: Family Medicine

## 2021-11-19 DIAGNOSIS — I872 Venous insufficiency (chronic) (peripheral): Secondary | ICD-10-CM

## 2021-11-23 ENCOUNTER — Ambulatory Visit: Payer: Medicare Other | Attending: Family Medicine | Admitting: Rehabilitative and Restorative Service Providers"

## 2021-11-23 ENCOUNTER — Other Ambulatory Visit: Payer: Self-pay

## 2021-11-23 ENCOUNTER — Encounter: Payer: Self-pay | Admitting: Rehabilitative and Restorative Service Providers"

## 2021-11-23 DIAGNOSIS — M6281 Muscle weakness (generalized): Secondary | ICD-10-CM | POA: Diagnosis not present

## 2021-11-23 DIAGNOSIS — R296 Repeated falls: Secondary | ICD-10-CM | POA: Diagnosis not present

## 2021-11-23 DIAGNOSIS — R2681 Unsteadiness on feet: Secondary | ICD-10-CM | POA: Diagnosis not present

## 2021-11-23 DIAGNOSIS — R262 Difficulty in walking, not elsewhere classified: Secondary | ICD-10-CM | POA: Diagnosis not present

## 2021-11-23 DIAGNOSIS — R42 Dizziness and giddiness: Secondary | ICD-10-CM

## 2021-11-23 NOTE — Therapy (Addendum)
[facility] @ [facility] [place], [place], [address] Phone: [phone]   Fax:  [phone]  Physical Therapy Treatment  Patient Details  Name: Tracy [name] MRN: [id number] Date of Birth: [birth year] Referring Provider (PT): [name], MD   Encounter Date: 11/23/2021   PT End of Session - 11/23/21 0921     Visit Number 4    Date for PT Re-Evaluation 12/24/21    Authorization Type Medicare    Progress Note Due on Visit 10    PT Start Time 0915    PT Stop Time 0955    PT Time Calculation (min) 40 min    Activity Tolerance Patient tolerated treatment well    Behavior During Therapy [facility] for tasks assessed/performed             Past Medical History:  Diagnosis Date   Allergic rhinitis    Allergy    Anxiety    Arthritis    Bronchitis    CAD (coronary artery disease)    1/19 PCI/DES to [place] for ISR, normal EF.    Cancer ([place])    hx of precancerous cells in right breast    Cataract    Cervical dysplasia    unsure of procedure, possible "burning" in her late 67s   CHF (congestive heart failure) ([place])    Echo 06/2019: EF 55-60, elevated LVEDP, normal RV SF, mild MAC, mild MR, trivial TR   Complication of anesthesia    COPD (chronic obstructive pulmonary disease) (HCC)    early    Depression    Dyspnea    with exertion    Family history of adverse reaction to anesthesia    daughter- problems wiht n/v   GERD (gastroesophageal reflux disease)    Gout    Headache(784.0)    Heart murmur    hx of years ago    Hyperlipidemia    Hypertension    Low back pain    Menopausal syndrome    Myocardial infarct ([place]) 2007   hx of   Overactive bladder    PONV (postoperative nausea and vomiting)    Sleep apnea     Past Surgical History:  Procedure Laterality Date   ANGIOPLASTY     stent 2007   BREAST LUMPECTOMY WITH RADIOACTIVE SEED LOCALIZATION Right 04/28/2020   Procedure: RIGHT BREAST LUMPECTOMY X 2   WITH RADIOACTIVE SEED LOCALIZATION;  Surgeon: Donnie Mesa, MD;  Location: [facility];  Service: General;  Laterality: Right;  LMA   CARDIAC CATHETERIZATION     COLONOSCOPY     CORONARY STENT INTERVENTION N/A 11/06/2017   Procedure: CORONARY STENT INTERVENTION;  Surgeon: [name], MD;  Location: [place] [facility];  Service: Cardiovascular;  Laterality: N/A;   LEFT HEART CATH AND CORONARY ANGIOGRAPHY N/A 11/06/2017   Procedure: LEFT HEART CATH AND CORONARY ANGIOGRAPHY;  Surgeon: [name], MD;  Location: [place] [facility];  Service: Cardiovascular;  Laterality: N/A;   LEFT HEART CATH AND CORONARY ANGIOGRAPHY N/A 07/24/2019   Procedure: LEFT HEART CATH AND CORONARY ANGIOGRAPHY;  Surgeon: Burnell Blanks, MD;  Location: [facility];  Service: Cardiovascular;  Laterality: N/A;   RIGHT/LEFT HEART CATH AND CORONARY ANGIOGRAPHY N/A 09/29/2020   Procedure: RIGHT/LEFT HEART CATH AND CORONARY ANGIOGRAPHY;  Surgeon: Belva Crome, MD;  Location: [facility];  Service: Cardiovascular;  Laterality: N/A;   ROBOTIC ASSISTED BILATERAL SALPINGO OOPHERECTOMY Bilateral 11/10/2020   Procedure: XI ROBOTIC ASSISTED  BILATERAL SALPINGO OOPHORECTOMY WITH MINI LAPAROTOMY FOR DRAINAGE;  Surgeon: Lafonda Mosses, MD;  [facility]: [facility];  Service: Gynecology;  Laterality: Bilateral;  MINI LAP FIRST   TONSILLECTOMY     VULVECTOMY N/A 11/10/2020   Procedure: WIDE EXCISION VULVECTOMY;  Surgeon: Lafonda Mosses, MD;  [facility]: [facility];  Service: Gynecology;  Laterality: N/A;    There were no vitals filed for this visit.   Subjective Assessment - 11/23/21 0922     Subjective Pt reports that she has not had as much double vision as she had before, but continues to have dizziness.  Pt followed up with Dr Tat and a MRI has been ordered.  Pt has an appointment with ENT tomorrow and also was advised to go see an opthamologist.    Patient Stated Goals To not have double  vision and walk without dizziness.    Currently in Pain? Yes    Pain Score 2     Pain Location Back    Pain Orientation Lower    Pain Descriptors / Indicators Sore    Pain Type Chronic pain                               [facility] Adult PT Treatment/Exercise - 11/23/21 0001       Lumbar Exercises: Aerobic   Nustep L5 x6' with PT present to discuss pt status...             Vestibular Treatment/Exercise - 11/23/21 0001       Vestibular Treatment/Exercise   Gaze Exercises Eye/Head Exercise Horizontal;Eye/Head Exercise Vertical;Comment      X1 Viewing Horizontal   Comments VOR 1x1 2x1 min      Eye/Head Exercise Horizontal   Comments VOR 1x1, 2x1 min      Eye/Head Exercise Vertical   Comments Horizontal head turns down hallway with SPC.    Comment Pencil push ups 2x10 reps.  Tracking alphabet with eyes. Standing on blue foam for static stance, then performing head turns on blue foam.                      PT Short Term Goals - 11/23/21 1029       PT SHORT TERM GOAL #1   Title Pt will be independent with initial vestibular HEP.    Status Achieved               PT Long Term Goals - 11/04/21 1035       PT LONG TERM GOAL #1   Title Pt will be independent with advanced HEP.    Time 8    Period Weeks    Status New      PT LONG TERM GOAL #2   Title Pt will complete TUG in 14 sec or less without assistive device to demonstrate improved functional balance.    Time 8    Period Weeks    Status New      PT LONG TERM GOAL #3   Title Pt to have a BERG of at least 50/56 to place her at a lower risk of falling.    Time 8    Period Weeks    Status New      PT LONG TERM GOAL #4   Title Pt will increase B hip strength to at least 4+/5 to allow her to perform transfers with increased ease.    Time 8  Period Weeks    Status New      PT LONG TERM GOAL #5   Title Pt will be able to track with left eye past midline when looking towards  right side.    Time 8    Period Weeks    Status New                   Plan - 11/23/21 1015     Clinical Impression Statement Ms Soohoo is demonstrating improved tracking on her bilateral eyes.  Only one instance of decreased tracking of L eye noted during session today, otherwise pt has improved tracking noted to right side.  Pt continues to have dizziness noted.  Started standing on blue foam to continue to work standing balance.  Pt continues to require skilled PT to progress towards goal related activities.    Comorbidities OA    PT Treatment/Interventions ADLs/Self Care Home Management;Gait training;Stair training;Functional mobility training;Therapeutic activities;Therapeutic exercise;Neuromuscular re-education;Balance training;Patient/family education;Manual techniques;Passive range of motion;Dry needling;Taping;Joint Manipulations;Cryotherapy;Electrical Stimulation;Iontophoresis 41m/ml Dexamethasone;Moist Heat;Canalith Repostioning;Vestibular    PT Next Visit Plan assess and progress HEP.  Vestibular, balance, strengthening.    PT Home Exercise Plan For Strengthening: Access Code [email or web address]  For Vestibular: Access Code: [email or web address]   Consulted and Agree with Plan of Care Patient             Patient will benefit from skilled therapeutic intervention in order to improve the following deficits and impairments:  Difficulty walking, Dizziness, Decreased activity tolerance, Pain, Decreased balance, Decreased strength  Visit Diagnosis: Dizziness and giddiness  Unsteadiness on feet  Repeated falls  Muscle weakness (generalized)  Difficulty in walking, not elsewhere classified     Problem List Patient Active Problem List   Diagnosis Date Noted   Tremor of both hands 08/16/2021   Aortic atherosclerosis ([place] 06/15/2021   Generalized osteoarthritis of multiple sites 12/25/2020   Chronic pain disorder 12/25/2020   Pelvic mass in female    Bilateral lower extremity  edema 04/13/2020   Shortness of breath    Anxiety disorder 10/29/2018   CKD (chronic kidney disease), stage III ([place] 06/27/2018   COPD (chronic obstructive pulmonary disease) ([place] 05/25/2018   Obesity 03/20/2018   Dyspnea on exertion 11/06/2017   Abnormal stress test 11/06/2017   Chronic diastolic CHF (congestive heart failure) ([place] 10/03/2017   Chronic combined systolic and diastolic heart failure ([place] 09/04/2017   Left ventricular dysfunction 07/31/2017   Cataract, nuclear sclerotic senile, bilateral 02/01/2017   Gouty arthritis of toe of left foot 08/09/2016   Hyperuricemia 08/09/2016   Osteoarthritis (arthritis due to wear and tear of joints) 01/14/2014   Coronary artery disease 08/02/2011   Mitral regurgitation 08/02/2011   OVERACTIVE BLADDER 07/02/2010   ACUTE CYSTITIS 05/25/2010   Pure hypercholesterolemia 08/11/2009   PEDAL EDEMA 07/22/2009   GERD 04/11/2008   Essential hypertension 05/30/2007   MYOCARDIAL INFARCTION, HX OF 05/30/2007   Allergic rhinitis 05/30/2007   LOW BACK PAIN 05/30/2007    SJuel Burrow PT, DPT 11/23/2021, 11:03 AM  [email or web address] [address]Phone: [phone]  Fax:  [phone] Name: BJAYLYN IYERMRN: [id number] of Birth: 610-Dec-1947

## 2021-11-24 DIAGNOSIS — R42 Dizziness and giddiness: Secondary | ICD-10-CM | POA: Diagnosis not present

## 2021-11-25 ENCOUNTER — Other Ambulatory Visit: Payer: Self-pay

## 2021-11-25 ENCOUNTER — Encounter: Payer: Self-pay | Admitting: Rehabilitative and Restorative Service Providers"

## 2021-11-25 ENCOUNTER — Ambulatory Visit: Payer: Medicare Other | Admitting: Rehabilitative and Restorative Service Providers"

## 2021-11-25 DIAGNOSIS — R296 Repeated falls: Secondary | ICD-10-CM | POA: Diagnosis not present

## 2021-11-25 DIAGNOSIS — R42 Dizziness and giddiness: Secondary | ICD-10-CM | POA: Diagnosis not present

## 2021-11-25 DIAGNOSIS — R2681 Unsteadiness on feet: Secondary | ICD-10-CM | POA: Diagnosis not present

## 2021-11-25 DIAGNOSIS — M6281 Muscle weakness (generalized): Secondary | ICD-10-CM | POA: Diagnosis not present

## 2021-11-25 DIAGNOSIS — R262 Difficulty in walking, not elsewhere classified: Secondary | ICD-10-CM | POA: Diagnosis not present

## 2021-11-25 LAB — MYASTHENIA GRAVIS PANEL 1
A CHR BINDING ABS: <0.3 nmol/L
STRIATED MUSCLE AB SCREEN: NEGATIVE

## 2021-11-25 NOTE — Therapy (Signed)
[facility] @ [facility] [place], [place], [address] Phone: [phone]   Fax:  [phone]  Physical Therapy Treatment  Patient Details  Name: Tracy [name] MRN: [id number] Date of Birth: 08/22/1946 Referring Provider (PT): [name], MD   Encounter Date: 11/25/2021   PT End of Session - 11/25/21 0933     Visit Number 5    Date for PT Re-Evaluation 12/24/21    Authorization Type Medicare    Authorization Time Period 10/10/2021-11/05/21    Progress Note Due on Visit 10    PT Start Time 0930    PT Stop Time 1010    PT Time Calculation (min) 40 min    Activity Tolerance Patient tolerated treatment well    Behavior During Therapy [facility] for tasks assessed/performed             Past Medical History:  Diagnosis Date   Allergic rhinitis    Allergy    Anxiety    Arthritis    Bronchitis    CAD (coronary artery disease)    1/19 PCI/DES to [place] for ISR, normal EF.    Cancer ([place])    hx of precancerous cells in right breast    Cataract    Cervical dysplasia    unsure of procedure, possible "burning" in her late 83s   CHF (congestive heart failure) ([place])    Echo 06/2019: EF 55-60, elevated LVEDP, normal RV SF, mild MAC, mild MR, trivial TR   Complication of anesthesia    COPD (chronic obstructive pulmonary disease) (HCC)    early    Depression    Dyspnea    with exertion    Family history of adverse reaction to anesthesia    daughter- problems wiht n/v   GERD (gastroesophageal reflux disease)    Gout    Headache(784.0)    Heart murmur    hx of years ago    Hyperlipidemia    Hypertension    Low back pain    Menopausal syndrome    Myocardial infarct ([place]) 2007   hx of   Overactive bladder    PONV (postoperative nausea and vomiting)    Sleep apnea     Past Surgical History:  Procedure Laterality Date   ANGIOPLASTY     stent 2007   BREAST LUMPECTOMY WITH RADIOACTIVE SEED LOCALIZATION Right  04/28/2020   Procedure: RIGHT BREAST LUMPECTOMY X 2  WITH RADIOACTIVE SEED LOCALIZATION;  Surgeon: Donnie Mesa, MD;  Location: [facility];  Service: General;  Laterality: Right;  LMA   CARDIAC CATHETERIZATION     COLONOSCOPY     CORONARY STENT INTERVENTION N/A 11/06/2017   Procedure: CORONARY STENT INTERVENTION;  Surgeon: [name], MD;  Location: [place] [facility];  Service: Cardiovascular;  Laterality: N/A;   LEFT HEART CATH AND CORONARY ANGIOGRAPHY N/A 11/06/2017   Procedure: LEFT HEART CATH AND CORONARY ANGIOGRAPHY;  Surgeon: [name], MD;  Location: [facility];  Service: Cardiovascular;  Laterality: N/A;   LEFT HEART CATH AND CORONARY ANGIOGRAPHY N/A 07/24/2019   Procedure: LEFT HEART CATH AND CORONARY ANGIOGRAPHY;  Surgeon: Burnell Blanks, MD;  Location: [facility];  Service: Cardiovascular;  Laterality: N/A;   RIGHT/LEFT HEART CATH AND CORONARY ANGIOGRAPHY N/A 09/29/2020   Procedure: RIGHT/LEFT HEART CATH AND CORONARY ANGIOGRAPHY;  Surgeon: Belva Crome, MD;  Location: [facility];  Service: Cardiovascular;  Laterality: N/A;   ROBOTIC ASSISTED BILATERAL SALPINGO OOPHERECTOMY Bilateral  11/10/2020   Procedure: XI ROBOTIC ASSISTED BILATERAL SALPINGO OOPHORECTOMY WITH MINI LAPAROTOMY FOR DRAINAGE;  Surgeon: Lafonda Mosses, MD;  [facility];  Service: Gynecology;  Laterality: Bilateral;  MINI LAP FIRST   TONSILLECTOMY     VULVECTOMY N/A 11/10/2020   Procedure: WIDE EXCISION VULVECTOMY;  Surgeon: Lafonda Mosses, MD;  [facility];  Service: Gynecology;  Laterality: N/A;    There were no vitals filed for this visit.   Subjective Assessment - 11/25/21 0941     Subjective Pt reports that the ENT told her to no longer take the Meclizine and prescribed her a nasal spray, but cannot recall the reasons.    Pertinent History CAD, arthritis    Patient Stated Goals To not have double vision and walk without dizziness.     Currently in Pain? Yes    Pain Score 2     Pain Location Knee    Pain Orientation Left    Pain Descriptors / Indicators Sore    Pain Type Chronic pain                               [facility] Adult PT Treatment/Exercise - 11/25/21 0001       Ambulation/Gait   Ambulation/Gait Yes    Ambulation/Gait Assistance 5: Supervision    Ambulation Distance (Feet) 200 Feet    Assistive device None    Gait Comments Pt with unsteady gait noted requiring close SBA with one standing recovery period and one seated recovery period.      High Level Balance   High Level Balance Comments standing on blue foam performing ball toss.      Lumbar Exercises: Aerobic   Nustep L5 x6' with PT present to discuss pt status...      Knee/Hip Exercises: Standing   Other Standing Knee Exercises Alt LE toe taps to 6" steps 2x10 each             Vestibular Treatment/Exercise - 11/25/21 0001       Eye/Head Exercise Horizontal   Comments Standing performing horizontal head turns 2 x 1 min.                      PT Short Term Goals - 11/23/21 1029       PT SHORT TERM GOAL #1   Title Pt will be independent with initial vestibular HEP.    Status Achieved               PT Long Term Goals - 11/25/21 1013       PT LONG TERM GOAL #1   Title Pt will be independent with advanced HEP.    Status On-going      PT LONG TERM GOAL #2   Title Pt will complete TUG in 14 sec or less without assistive device to demonstrate improved functional balance.    Status On-going      PT LONG TERM GOAL #3   Title Pt to have a BERG of at least 50/56 to place her at a lower risk of falling.    Status On-going      PT LONG TERM GOAL #4   Title Pt will increase B hip strength to at least 4+/5 to allow her to perform transfers with increased ease.    Status On-going      PT LONG TERM GOAL #5   Title Pt will be able to  track with left eye past midline when looking towards right side.     Status On-going                   Plan - 11/25/21 1010     Clinical Impression Statement Ms Tracy [name] continues to progress towards goal related activities.  Focus of today was primarily on balance and ambulation without assistive device. Pt continues with unsteadiness with ambulation without assistive device.  Pt continues to have reports of dizziness throughout session, but visual tracking continues to be improved since initial evaluation.  Pt continues to require skilled PT to progress towards decreased dizziness and increased balance.    Personal Factors and Comorbidities Past/Current Experience;Fitness;Age;Comorbidity 1    Comorbidities OA    PT Treatment/Interventions ADLs/Self Care Home Management;Gait training;Stair training;Functional mobility training;Therapeutic activities;Therapeutic exercise;Neuromuscular re-education;Balance training;Patient/family education;Manual techniques;Passive range of motion;Dry needling;Taping;Joint Manipulations;Cryotherapy;Electrical Stimulation;Iontophoresis 38m/ml Dexamethasone;Moist Heat;Canalith Repostioning;Vestibular    PT Next Visit Plan assess and progress HEP.  Vestibular, balance, strengthening.    PT Home Exercise Plan For Strengthening: Access Code [email or web address]  For Vestibular: Access Code: [email or web address]   Consulted and Agree with Plan of Care Patient             Patient will benefit from skilled therapeutic intervention in order to improve the following deficits and impairments:  Difficulty walking, Dizziness, Decreased activity tolerance, Pain, Decreased balance, Decreased strength  Visit Diagnosis: Dizziness and giddiness  Unsteadiness on feet  Repeated falls  Muscle weakness (generalized)  Difficulty in walking, not elsewhere classified     Problem List Patient Active Problem List   Diagnosis Date Noted   Tremor of both hands 08/16/2021   Aortic atherosclerosis ([facility] 06/15/2021   Generalized osteoarthritis of  multiple sites 12/25/2020   Chronic pain disorder 12/25/2020   Pelvic mass in female    Bilateral lower extremity edema 04/13/2020   Shortness of breath    Anxiety disorder 10/29/2018   CKD (chronic kidney disease), stage III ([place] 06/27/2018   COPD (chronic obstructive pulmonary disease) ([place] 05/25/2018   Obesity 03/20/2018   Dyspnea on exertion 11/06/2017   Abnormal stress test 11/06/2017   Chronic diastolic CHF (congestive heart failure) ([place] 10/03/2017   Chronic combined systolic and diastolic heart failure ([place] 09/04/2017   Left ventricular dysfunction 07/31/2017   Cataract, nuclear sclerotic senile, bilateral 02/01/2017   Gouty arthritis of toe of left foot 08/09/2016   Hyperuricemia 08/09/2016   Osteoarthritis (arthritis due to wear and tear of joints) 01/14/2014   Coronary artery disease 08/02/2011   Mitral regurgitation 08/02/2011   OVERACTIVE BLADDER 07/02/2010   ACUTE CYSTITIS 05/25/2010   Pure hypercholesterolemia 08/11/2009   PEDAL EDEMA 07/22/2009   GERD 04/11/2008   Essential hypertension 05/30/2007   MYOCARDIAL INFARCTION, HX OF 05/30/2007   Allergic rhinitis 05/30/2007   LOW BACK PAIN 05/30/2007    SJuel Burrow PT, DPT 11/25/2021, 10:15 AM  [email or web address] [address]Phone: [phone]  Fax:  [phone] Name: Tracy MATHESMRN: [id number] of Birth: [birth year]

## 2021-11-27 ENCOUNTER — Ambulatory Visit
Admission: RE | Admit: 2021-11-27 | Discharge: 2021-11-27 | Disposition: A | Discharge status: Home / Self Care | Payer: Medicare Other | Source: Ambulatory Visit | Attending: Neurology | Admitting: Neurology

## 2021-11-27 ENCOUNTER — Other Ambulatory Visit: Payer: Self-pay

## 2021-11-27 DIAGNOSIS — R42 Dizziness and giddiness: Secondary | ICD-10-CM

## 2021-11-27 DIAGNOSIS — R29810 Facial weakness: Secondary | ICD-10-CM

## 2021-11-30 ENCOUNTER — Encounter: Payer: Self-pay | Admitting: Rehabilitative and Restorative Service Providers"

## 2021-11-30 ENCOUNTER — Ambulatory Visit: Payer: Medicare Other | Admitting: Rehabilitative and Restorative Service Providers"

## 2021-11-30 ENCOUNTER — Other Ambulatory Visit: Payer: Self-pay

## 2021-11-30 DIAGNOSIS — R42 Dizziness and giddiness: Secondary | ICD-10-CM

## 2021-11-30 DIAGNOSIS — R2681 Unsteadiness on feet: Secondary | ICD-10-CM | POA: Diagnosis not present

## 2021-11-30 DIAGNOSIS — M6281 Muscle weakness (generalized): Secondary | ICD-10-CM

## 2021-11-30 DIAGNOSIS — R296 Repeated falls: Secondary | ICD-10-CM

## 2021-11-30 DIAGNOSIS — R262 Difficulty in walking, not elsewhere classified: Secondary | ICD-10-CM

## 2021-11-30 NOTE — Therapy (Signed)
[place] @ [address] [place], [place], [address] Phone: [phone]   Fax:  [phone]  Physical Therapy Treatment  Patient Details  Name: Tracy [name] MRN: [id number] Date of Birth: [birth year] Referring Provider (PT): [name], MD   Encounter Date: 11/30/2021   PT End of Session - 11/30/21 0907     Visit Number 6    Date for PT Re-Evaluation 12/24/21    Authorization Type Medicare    Progress Note Due on Visit 10    PT Start Time 0900    PT Stop Time 0940    PT Time Calculation (min) 40 min    Activity Tolerance Patient tolerated treatment well    Behavior During Therapy [facility] for tasks assessed/performed             Past Medical History:  Diagnosis Date   Allergic rhinitis    Allergy    Anxiety    Arthritis    Bronchitis    CAD (coronary artery disease)    1/19 PCI/DES to [facility] for ISR, normal EF.    Cancer ([place])    hx of precancerous cells in right breast    Cataract    Cervical dysplasia    unsure of procedure, possible "burning" in her late 32s   CHF (congestive heart failure) ([place])    Echo 06/2019: EF 55-60, elevated LVEDP, normal RV SF, mild MAC, mild MR, trivial TR   Complication of anesthesia    COPD (chronic obstructive pulmonary disease) (HCC)    early    Depression    Dyspnea    with exertion    Family history of adverse reaction to anesthesia    daughter- problems wiht n/v   GERD (gastroesophageal reflux disease)    Gout    Headache(784.0)    Heart murmur    hx of years ago    Hyperlipidemia    Hypertension    Low back pain    Menopausal syndrome    Myocardial infarct ([place]) 2007   hx of   Overactive bladder    PONV (postoperative nausea and vomiting)    Sleep apnea     Past Surgical History:  Procedure Laterality Date   ANGIOPLASTY     stent 2007   BREAST LUMPECTOMY WITH RADIOACTIVE SEED LOCALIZATION Right 04/28/2020   Procedure: RIGHT BREAST LUMPECTOMY X 2   WITH RADIOACTIVE SEED LOCALIZATION;  Surgeon: Donnie Mesa, MD;  Location: [facility];  Service: General;  Laterality: Right;  LMA   CARDIAC CATHETERIZATION     COLONOSCOPY     CORONARY STENT INTERVENTION N/A 11/06/2017   Procedure: CORONARY STENT INTERVENTION;  Surgeon: [name], MD;  Location: [place] [facility];  Service: Cardiovascular;  Laterality: N/A;   LEFT HEART CATH AND CORONARY ANGIOGRAPHY N/A 11/06/2017   Procedure: LEFT HEART CATH AND CORONARY ANGIOGRAPHY;  Surgeon: [name], MD;  Location: [place] [facility];  Service: Cardiovascular;  Laterality: N/A;   LEFT HEART CATH AND CORONARY ANGIOGRAPHY N/A 07/24/2019   Procedure: LEFT HEART CATH AND CORONARY ANGIOGRAPHY;  Surgeon: Burnell Blanks, MD;  Location: [place] [facility];  Service: Cardiovascular;  Laterality: N/A;   RIGHT/LEFT HEART CATH AND CORONARY ANGIOGRAPHY N/A 09/29/2020   Procedure: RIGHT/LEFT HEART CATH AND CORONARY ANGIOGRAPHY;  Surgeon: Belva Crome, MD;  Location: [place];  Service: Cardiovascular;  Laterality: N/A;   ROBOTIC ASSISTED BILATERAL SALPINGO OOPHERECTOMY Bilateral 11/10/2020   Procedure: XI ROBOTIC ASSISTED  BILATERAL SALPINGO OOPHORECTOMY WITH MINI LAPAROTOMY FOR DRAINAGE;  Surgeon: Lafonda Mosses, MD;  [facility];  Service: Gynecology;  Laterality: Bilateral;  MINI LAP FIRST   TONSILLECTOMY     VULVECTOMY N/A 11/10/2020   Procedure: WIDE EXCISION VULVECTOMY;  Surgeon: Lafonda Mosses, MD;  [facility];  Service: Gynecology;  Laterality: N/A;    There were no vitals filed for this visit.   Subjective Assessment - 11/30/21 0908     Subjective Pt reports that she had her brain MRI on Saturday.  She states that she is still having some dizziness without any change.    Pertinent History CAD, arthritis    Diagnostic tests MRI on 11/27/21 IMPRESSION:  No acute or reversible finding. Mild chronic small-vessel ischemic  changes of the pons and  cerebellum. Moderate chronic small-vessel  ischemic changes of the cerebral hemispheric white matter. Old  lacunar infarction left caudate body.    Patient Stated Goals To not have double vision and walk without dizziness.    Currently in Pain? Yes    Pain Score 1     Pain Location Knee    Pain Orientation Left    Pain Descriptors / Indicators Sore    Pain Type Chronic pain                               [facility] Adult PT Treatment/Exercise - 11/30/21 0001       Ambulation/Gait   Gait Comments Ambulation around PT clinic without assistive device with close SBA with slow and minimally unsteady gait pattern noted.      Lumbar Exercises: Aerobic   Nustep L5 x6' with PT present to discuss progress.      Knee/Hip Exercises: Standing   Other Standing Knee Exercises Atl LE taps to 2 cones x10 bilat with CGA.             Vestibular Treatment/Exercise - 11/30/21 0001       X1 Viewing Horizontal   Comments VOR 1x1 2x1 min      Eye/Head Exercise Vertical   Comment Pencil push ups 2x10 reps.  Standing on blue foam for static stance, then performing head turns on blue foam.                      PT Short Term Goals - 11/23/21 1029       PT SHORT TERM GOAL #1   Title Pt will be independent with initial vestibular HEP.    Status Achieved               PT Long Term Goals - 11/25/21 1013       PT LONG TERM GOAL #1   Title Pt will be independent with advanced HEP.    Status On-going      PT LONG TERM GOAL #2   Title Pt will complete TUG in 14 sec or less without assistive device to demonstrate improved functional balance.    Status On-going      PT LONG TERM GOAL #3   Title Pt to have a BERG of at least 50/56 to place her at a lower risk of falling.    Status On-going      PT LONG TERM GOAL #4   Title Pt will increase B hip strength to at least 4+/5 to allow her to perform transfers with increased ease.    Status On-going  PT LONG  TERM GOAL #5   Title Pt will be able to track with left eye past midline when looking towards right side.    Status On-going                   Plan - 11/30/21 0944     Clinical Impression Statement Ms Rasmus had MRI of brain on 11/27/21 and reveals chronic mild ischemic infarct of cerebellum and pons that were not present on CT scan of head in October 2022.  Pt continues with reports of dizziness, but overall is able to tolerate increased activities and balance challenges during ther ex. Pt continues to deny double vision and reports that her eye pain has decreased some, but she does still have it occasionally.  Pt continues to require skilled PT to progress towards goal related activities and ability to perform functional tasks with improved balance and decreased dizziness.    Personal Factors and Comorbidities Past/Current Experience;Fitness;Age;Comorbidity 1    PT Treatment/Interventions ADLs/Self Care Home Management;Gait training;Stair training;Functional mobility training;Therapeutic activities;Therapeutic exercise;Neuromuscular re-education;Balance training;Patient/family education;Manual techniques;Passive range of motion;Dry needling;Taping;Joint Manipulations;Cryotherapy;Electrical Stimulation;Iontophoresis 110m/ml Dexamethasone;Moist Heat;Canalith Repostioning;Vestibular    PT Next Visit Plan assess and progress HEP.  Vestibular, balance, strengthening.    PT Home Exercise Plan For Strengthening: Access Code [email or web address]  For Vestibular: Access Code: [email or web address]   Consulted and Agree with Plan of Care Patient             Patient will benefit from skilled therapeutic intervention in order to improve the following deficits and impairments:  Difficulty walking, Dizziness, Decreased activity tolerance, Pain, Decreased balance, Decreased strength  Visit Diagnosis: Dizziness and giddiness  Unsteadiness on feet  Repeated falls  Muscle weakness (generalized)  Difficulty in  walking, not elsewhere classified     Problem List Patient Active Problem List   Diagnosis Date Noted   Tremor of both hands 08/16/2021   Aortic atherosclerosis ([place] 06/15/2021   Generalized osteoarthritis of multiple sites 12/25/2020   Chronic pain disorder 12/25/2020   Pelvic mass in female    Bilateral lower extremity edema 04/13/2020   Shortness of breath    Anxiety disorder 10/29/2018   CKD (chronic kidney disease), stage III ([place] 06/27/2018   COPD (chronic obstructive pulmonary disease) ([place] 05/25/2018   Obesity 03/20/2018   Dyspnea on exertion 11/06/2017   Abnormal stress test 11/06/2017   Chronic diastolic CHF (congestive heart failure) ([place] 10/03/2017   Chronic combined systolic and diastolic heart failure ([place] 09/04/2017   Left ventricular dysfunction 07/31/2017   Cataract, nuclear sclerotic senile, bilateral 02/01/2017   Gouty arthritis of toe of left foot 08/09/2016   Hyperuricemia 08/09/2016   Osteoarthritis (arthritis due to wear and tear of joints) 01/14/2014   Coronary artery disease 08/02/2011   Mitral regurgitation 08/02/2011   OVERACTIVE BLADDER 07/02/2010   ACUTE CYSTITIS 05/25/2010   Pure hypercholesterolemia 08/11/2009   PEDAL EDEMA 07/22/2009   GERD 04/11/2008   Essential hypertension 05/30/2007   MYOCARDIAL INFARCTION, HX OF 05/30/2007   Allergic rhinitis 05/30/2007   LOW BACK PAIN 05/30/2007    SJuel Burrow PT, DPT 11/30/2021, 9:50 AM  [email or web address] [address]Phone: [phone]  Fax:  [phone] Name: BKEVA DARTYMRN: [id number] of Birth: [birth year]

## 2021-12-02 ENCOUNTER — Other Ambulatory Visit: Payer: Self-pay

## 2021-12-02 ENCOUNTER — Ambulatory Visit: Payer: Medicare Other | Admitting: Rehabilitative and Restorative Service Providers"

## 2021-12-02 ENCOUNTER — Encounter: Payer: Self-pay | Admitting: Rehabilitative and Restorative Service Providers"

## 2021-12-02 DIAGNOSIS — M6281 Muscle weakness (generalized): Secondary | ICD-10-CM | POA: Diagnosis not present

## 2021-12-02 DIAGNOSIS — R2681 Unsteadiness on feet: Secondary | ICD-10-CM | POA: Diagnosis not present

## 2021-12-02 DIAGNOSIS — R262 Difficulty in walking, not elsewhere classified: Secondary | ICD-10-CM | POA: Diagnosis not present

## 2021-12-02 DIAGNOSIS — R42 Dizziness and giddiness: Secondary | ICD-10-CM

## 2021-12-02 DIAGNOSIS — R296 Repeated falls: Secondary | ICD-10-CM | POA: Diagnosis not present

## 2021-12-02 NOTE — Therapy (Signed)
[place] @ [address] [place], [place], [address] Phone: [phone]   Fax:  [phone]  Physical Therapy Treatment  Patient Details  Name: Tracy [name] MRN: [id number] Date of Birth: November 27, 1945 Referring Provider (PT): [name], MD   Encounter Date: 12/02/2021   PT End of Session - 12/02/21 0916     Visit Number 7    Date for PT Re-Evaluation 12/24/21    Authorization Type Medicare    Progress Note Due on Visit 10    PT Start Time 0915    PT Stop Time 0955    PT Time Calculation (min) 40 min    Activity Tolerance Patient tolerated treatment well    Behavior During Therapy [facility] for tasks assessed/performed             Past Medical History:  Diagnosis Date   Allergic rhinitis    Allergy    Anxiety    Arthritis    Bronchitis    CAD (coronary artery disease)    1/19 PCI/DES to [facility] for ISR, normal EF.    Cancer ([place])    hx of precancerous cells in right breast    Cataract    Cervical dysplasia    unsure of procedure, possible "burning" in her late 41s   CHF (congestive heart failure) ([place])    Echo 06/2019: EF 55-60, elevated LVEDP, normal RV SF, mild MAC, mild MR, trivial TR   Complication of anesthesia    COPD (chronic obstructive pulmonary disease) (HCC)    early    Depression    Dyspnea    with exertion    Family history of adverse reaction to anesthesia    daughter- problems wiht n/v   GERD (gastroesophageal reflux disease)    Gout    Headache(784.0)    Heart murmur    hx of years ago    Hyperlipidemia    Hypertension    Low back pain    Menopausal syndrome    Myocardial infarct ([place]) 2007   hx of   Overactive bladder    PONV (postoperative nausea and vomiting)    Sleep apnea     Past Surgical History:  Procedure Laterality Date   ANGIOPLASTY     stent 2007   BREAST LUMPECTOMY WITH RADIOACTIVE SEED LOCALIZATION Right 04/28/2020   Procedure: RIGHT BREAST LUMPECTOMY X 2   WITH RADIOACTIVE SEED LOCALIZATION;  Surgeon: Donnie Mesa, MD;  Location: [facility];  Service: General;  Laterality: Right;  LMA   CARDIAC CATHETERIZATION     COLONOSCOPY     CORONARY STENT INTERVENTION N/A 11/06/2017   Procedure: CORONARY STENT INTERVENTION;  Surgeon: [name], MD;  Location: [facility];  Service: Cardiovascular;  Laterality: N/A;   LEFT HEART CATH AND CORONARY ANGIOGRAPHY N/A 11/06/2017   Procedure: LEFT HEART CATH AND CORONARY ANGIOGRAPHY;  Surgeon: [name], MD;  Location: [facility] [facility];  Service: Cardiovascular;  Laterality: N/A;   LEFT HEART CATH AND CORONARY ANGIOGRAPHY N/A 07/24/2019   Procedure: LEFT HEART CATH AND CORONARY ANGIOGRAPHY;  Surgeon: Burnell Blanks, MD;  Location: [facility];  Service: Cardiovascular;  Laterality: N/A;   RIGHT/LEFT HEART CATH AND CORONARY ANGIOGRAPHY N/A 09/29/2020   Procedure: RIGHT/LEFT HEART CATH AND CORONARY ANGIOGRAPHY;  Surgeon: Belva Crome, MD;  Location: [facility];  Service: Cardiovascular;  Laterality: N/A;   ROBOTIC ASSISTED BILATERAL SALPINGO OOPHERECTOMY Bilateral 11/10/2020   Procedure: XI ROBOTIC ASSISTED  BILATERAL SALPINGO OOPHORECTOMY WITH MINI LAPAROTOMY FOR DRAINAGE;  Surgeon: Lafonda Mosses, MD;  [facility];  Service: Gynecology;  Laterality: Bilateral;  MINI LAP FIRST   TONSILLECTOMY     VULVECTOMY N/A 11/10/2020   Procedure: WIDE EXCISION VULVECTOMY;  Surgeon: Lafonda Mosses, MD;  [facility];  Service: Gynecology;  Laterality: N/A;    There were no vitals filed for this visit.   Subjective Assessment - 12/02/21 0917     Subjective Pt states that she has not noted any increase in dizziness since stopping the Meclizine per her ENT's request. Pt states that she has not had any double vision since 11/15/21.    Pertinent History CAD, arthritis    Diagnostic tests MRI on 11/27/21 IMPRESSION:  No acute or reversible finding. Mild  chronic small-vessel ischemic  changes of the pons and cerebellum. Moderate chronic small-vessel  ischemic changes of the cerebral hemispheric white matter. Old  lacunar infarction left caudate body.    Patient Stated Goals To not have double vision and walk without dizziness.    Currently in Pain? Yes    Pain Score 5     Pain Location Leg    Pain Orientation Left    Pain Descriptors / Indicators Aching    Pain Type Chronic pain    Aggravating Factors  pt with increased edema in LLE today.                               [facility] Adult PT Treatment/Exercise - 12/02/21 0001       Ambulation/Gait   Gait Comments Ambulation down from one PT gym to next without assistive device and distant SBA.  Ambulation outside down sidewalk in front of building without assistive device with SBA, one self corrected loss of balance.      Berg Balance Test   Sit to Stand Able to stand without using hands and stabilize independently    Standing Unsupported Able to stand safely 2 minutes    Sitting with Back Unsupported but Feet Supported on Floor or Stool Able to sit safely and securely 2 minutes    Stand to Sit Sits safely with minimal use of hands    Transfers Able to transfer safely, minor use of hands    Standing Unsupported with Eyes Closed Able to stand 10 seconds safely    Standing Ubsupported with Feet Together Able to place feet together independently and stand 1 minute safely    From Standing, Reach Forward with Outstretched Arm Can reach forward >12 cm safely (5")    From Standing Position, Pick up Object from Floor Able to pick up shoe safely and easily    From Standing Position, Turn to Look Behind Over each Shoulder Looks behind from both sides and weight shifts well    Turn 360 Degrees Able to turn 360 degrees safely one side only in 4 seconds or less    Standing Unsupported, Alternately Place Feet on Step/Stool Able to stand independently and complete 8 steps >20 seconds     Standing Unsupported, One Foot in Front Able to take small step independently and hold 30 seconds    Standing on One Leg Tries to lift leg/unable to hold 3 seconds but remains standing independently    Total Score 48      Timed Up and Go Test   TUG Normal TUG    Normal TUG (seconds) 11.9   without assistive  device     High Level Balance   High Level Balance Comments standing on blue foam performing ball toss.  Standing on blue foam performing horizontal head turns 2 x 1 min.  Tandem gait in parallel bars x4.      Lumbar Exercises: Aerobic   Nustep L5 x6' with PT present to discuss progress.   421 steps                      PT Short Term Goals - 11/23/21 1029       PT SHORT TERM GOAL #1   Title Pt will be independent with initial vestibular HEP.    Status Achieved               PT Long Term Goals - 12/02/21 1010       PT LONG TERM GOAL #1   Title Pt will be independent with advanced HEP.    Status On-going      PT LONG TERM GOAL #2   Title Pt will complete TUG in 14 sec or less without assistive device to demonstrate improved functional balance.    Status Achieved      PT LONG TERM GOAL #3   Title Pt to have a BERG of at least 50/56 to place her at a lower risk of falling.    Status Partially Met   48/56     PT LONG TERM GOAL #4   Title Pt will increase B hip strength to at least 4+/5 to allow her to perform transfers with increased ease.    Status On-going      PT LONG TERM GOAL #5   Title Pt will be able to track with left eye past midline when looking towards right side.    Status Achieved   Pt able to track past midline with L eye.                  Plan - 12/02/21 1007     Clinical Impression Statement Ms Muchow continues to progress towards goal related activities.  Her BERG continues to remain at a low risk of falling and pt has significantly decreased TUG score without assistive device.  Pt with increased LLE edema noted, she  stated that she will take her fluid pill when she gets home. Pt able to initiate ambulation outside without device with close SBA with pt having one self corrected loss of balance. Pt continues to have dizziness, but overall, balance has improved since initial evaluation.    Personal Factors and Comorbidities Past/Current Experience;Fitness;Age;Comorbidity 1    Comorbidities OA    PT Treatment/Interventions ADLs/Self Care Home Management;Gait training;Stair training;Functional mobility training;Therapeutic activities;Therapeutic exercise;Neuromuscular re-education;Balance training;Patient/family education;Manual techniques;Passive range of motion;Dry needling;Taping;Joint Manipulations;Cryotherapy;Electrical Stimulation;Iontophoresis 69m/ml Dexamethasone;Moist Heat;Canalith Repostioning;Vestibular    PT Next Visit Plan assess and progress HEP.  Vestibular, balance, strengthening.    PT Home Exercise Plan For Strengthening: Access Code [email or web address]  For Vestibular: Access Code: [email or web address]   Consulted and Agree with Plan of Care Patient             Patient will benefit from skilled therapeutic intervention in order to improve the following deficits and impairments:  Difficulty walking, Dizziness, Decreased activity tolerance, Pain, Decreased balance, Decreased strength  Visit Diagnosis: Dizziness and giddiness  Unsteadiness on feet  Repeated falls  Muscle weakness (generalized)  Difficulty in walking, not elsewhere classified     Problem List Patient Active Problem List   Diagnosis  Date Noted   Tremor of both hands 08/16/2021   Aortic atherosclerosis ([place]) 06/15/2021   Generalized osteoarthritis of multiple sites 12/25/2020   Chronic pain disorder 12/25/2020   Pelvic mass in female    Bilateral lower extremity edema 04/13/2020   Shortness of breath    Anxiety disorder 10/29/2018   CKD (chronic kidney disease), stage III ([place]) 06/27/2018   COPD (chronic obstructive pulmonary  disease) ([place]) 05/25/2018   Obesity 03/20/2018   Dyspnea on exertion 11/06/2017   Abnormal stress test 11/06/2017   Chronic diastolic CHF (congestive heart failure) ([place]) 10/03/2017   Chronic combined systolic and diastolic heart failure ([place]) 09/04/2017   Left ventricular dysfunction 07/31/2017   Cataract, nuclear sclerotic senile, bilateral 02/01/2017   Gouty arthritis of toe of left foot 08/09/2016   Hyperuricemia 08/09/2016   Osteoarthritis (arthritis due to wear and tear of joints) 01/14/2014   Coronary artery disease 08/02/2011   Mitral regurgitation 08/02/2011   OVERACTIVE BLADDER 07/02/2010   ACUTE CYSTITIS 05/25/2010   Pure hypercholesterolemia 08/11/2009   PEDAL EDEMA 07/22/2009   GERD 04/11/2008   Essential hypertension 05/30/2007   MYOCARDIAL INFARCTION, HX OF 05/30/2007   Allergic rhinitis 05/30/2007   LOW BACK PAIN 05/30/2007    Juel Burrow, PT, DPT 12/02/2021, 10:12 AM  [place] @ [address] [place], [place], [address] Phone: [phone]   Fax:  [phone]  Name: Tracy [name] MRN: [id number] Date of Birth: 1946/02/24

## 2021-12-03 ENCOUNTER — Ambulatory Visit (HOSPITAL_COMMUNITY)
Admission: RE | Admit: 2021-12-03 | Discharge: 2021-12-03 | Disposition: A | Discharge status: Home / Self Care | Payer: Medicare Other | Source: Ambulatory Visit | Attending: Neurology | Admitting: Neurology

## 2021-12-03 DIAGNOSIS — R251 Tremor, unspecified: Secondary | ICD-10-CM | POA: Insufficient documentation

## 2021-12-03 DIAGNOSIS — G20 Parkinson's disease: Secondary | ICD-10-CM | POA: Diagnosis not present

## 2021-12-03 MED ORDER — POTASSIUM IODIDE (ANTIDOTE) 130 MG PO TABS
ORAL_TABLET | ORAL | Status: AC
  Filled 2021-12-03: qty 1

## 2021-12-03 MED ORDER — IOFLUPANE I 123 185 MBQ/2.5ML IV SOLN
4.3000 | Freq: Once | INTRAVENOUS | Status: AC | PRN
  Administered 2021-12-03: 4.3 via INTRAVENOUS
  Filled 2021-12-03: qty 5

## 2021-12-06 ENCOUNTER — Telehealth: Payer: Self-pay

## 2021-12-06 NOTE — Telephone Encounter (Signed)
I do see that we put in order for MRI and it was completed on 11-28-21 MR BRAIN WO CONTRAST  Your MRI didn't show anything new.  As we discussed it would likely show, there is some hardening of the arteries in the brain.  There is a small old stroke in the brain (not new)   Wells Guiles Tat, DO Director of [facility]  I called a patient back and she is aware of the stroke being an old one wanting to know her next steps. She currently is not scheduled to come into the office and I told her I would find out about a follow up

## 2021-12-06 NOTE — Telephone Encounter (Signed)
Called patient with the results from her Dat Scan results.  Patient had  spoken to her Physical Therapist about the fact that  the Physical Therapist  thought the patient had a stroke and acted like someone suffering from a stroke. Patient asked me about her MRI result. The patients results from her MRI stated that There is a small old stroke in the brain (not new). I did tell her that it was not a new stroke but patient wanted me to reach out to DR. Tat to ask when the stroke happened and if all the other results are coming back negative could she have had a stroke between 10-22 and Jan-23 ?

## 2021-12-07 ENCOUNTER — Other Ambulatory Visit: Payer: Self-pay

## 2021-12-07 ENCOUNTER — Ambulatory Visit: Payer: Medicare Other | Admitting: Rehabilitative and Restorative Service Providers"

## 2021-12-07 ENCOUNTER — Encounter: Payer: Self-pay | Admitting: Rehabilitative and Restorative Service Providers"

## 2021-12-07 DIAGNOSIS — R2681 Unsteadiness on feet: Secondary | ICD-10-CM

## 2021-12-07 DIAGNOSIS — R262 Difficulty in walking, not elsewhere classified: Secondary | ICD-10-CM

## 2021-12-07 DIAGNOSIS — R42 Dizziness and giddiness: Secondary | ICD-10-CM | POA: Diagnosis not present

## 2021-12-07 DIAGNOSIS — R296 Repeated falls: Secondary | ICD-10-CM

## 2021-12-07 DIAGNOSIS — M6281 Muscle weakness (generalized): Secondary | ICD-10-CM | POA: Diagnosis not present

## 2021-12-07 NOTE — Therapy (Signed)
[facility] [place], [place], [address] Phone: [phone]   Fax:  [phone]  Physical Therapy Treatment  Patient Details  Name: Tracy [name] MRN: [id number] Date of Birth: [birth year] Referring Provider (PT): [name], MD   Encounter Date: 12/07/2021   PT End of Session - 12/07/21 0934     Visit Number 8    Date for PT Re-Evaluation 12/24/21    Authorization Type Medicare    Progress Note Due on Visit 10    PT Start Time 0930    PT Stop Time 1010    PT Time Calculation (min) 40 min    Activity Tolerance Patient tolerated treatment well    Behavior During Therapy [facility] for tasks assessed/performed             Past Medical History:  Diagnosis Date   Allergic rhinitis    Allergy    Anxiety    Arthritis    Bronchitis    CAD (coronary artery disease)    1/19 PCI/DES to [place] for ISR, normal EF.    Cancer ([place])    hx of precancerous cells in right breast    Cataract    Cervical dysplasia    unsure of procedure, possible "burning" in her late 9s   CHF (congestive heart failure) ([place])    Echo 06/2019: EF 55-60, elevated LVEDP, normal RV SF, mild MAC, mild MR, trivial TR   Complication of anesthesia    COPD (chronic obstructive pulmonary disease) (HCC)    early    Depression    Dyspnea    with exertion    Family history of adverse reaction to anesthesia    daughter- problems wiht n/v   GERD (gastroesophageal reflux disease)    Gout    Headache(784.0)    Heart murmur    hx of years ago    Hyperlipidemia    Hypertension    Low back pain    Menopausal syndrome    Myocardial infarct ([place]) 2007   hx of   Overactive bladder    PONV (postoperative nausea and vomiting)    Sleep apnea     Past Surgical History:  Procedure Laterality Date   ANGIOPLASTY     stent 2007   BREAST LUMPECTOMY WITH RADIOACTIVE SEED LOCALIZATION Right 04/28/2020   Procedure: RIGHT BREAST LUMPECTOMY X 2   WITH RADIOACTIVE SEED LOCALIZATION;  Surgeon: Donnie Mesa, MD;  Location: [place];  Service: General;  Laterality: Right;  LMA   CARDIAC CATHETERIZATION     COLONOSCOPY     CORONARY STENT INTERVENTION N/A 11/06/2017   Procedure: CORONARY STENT INTERVENTION;  Surgeon: [name], MD;  Location: [place] [facility];  Service: Cardiovascular;  Laterality: N/A;   LEFT HEART CATH AND CORONARY ANGIOGRAPHY N/A 11/06/2017   Procedure: LEFT HEART CATH AND CORONARY ANGIOGRAPHY;  Surgeon: [name], MD;  Location: [place] [facility];  Service: Cardiovascular;  Laterality: N/A;   LEFT HEART CATH AND CORONARY ANGIOGRAPHY N/A 07/24/2019   Procedure: LEFT HEART CATH AND CORONARY ANGIOGRAPHY;  Surgeon: Burnell Blanks, MD;  Location: [place] [facility];  Service: Cardiovascular;  Laterality: N/A;   RIGHT/LEFT HEART CATH AND CORONARY ANGIOGRAPHY N/A 09/29/2020   Procedure: RIGHT/LEFT HEART CATH AND CORONARY ANGIOGRAPHY;  Surgeon: Belva Crome, MD;  Location: [place] [facility];  Service: Cardiovascular;  Laterality: N/A;   ROBOTIC ASSISTED BILATERAL SALPINGO OOPHERECTOMY Bilateral 11/10/2020   Procedure: XI ROBOTIC ASSISTED  BILATERAL SALPINGO OOPHORECTOMY WITH MINI LAPAROTOMY FOR DRAINAGE;  Surgeon: Lafonda Mosses, MD;  [facility]: [facility];  Service: Gynecology;  Laterality: Bilateral;  MINI LAP FIRST   TONSILLECTOMY     VULVECTOMY N/A 11/10/2020   Procedure: WIDE EXCISION VULVECTOMY;  Surgeon: Lafonda Mosses, MD;  [facility]: [facility];  Service: Gynecology;  Laterality: N/A;    There were no vitals filed for this visit.   Subjective Assessment - 12/07/21 0934     Subjective Pt reports that she is having some increased pain in her knee and her R shoulder.  States that her brain scan for Park[place]inson's did not show anything.    Pertinent History CAD, arthritis    Diagnostic tests MRI on 11/27/21 IMPRESSION:  No acute or reversible finding. Mild chronic small-vessel  ischemic  changes of the pons and cerebellum. Moderate chronic small-vessel  ischemic changes of the cerebral hemispheric white matter. Old  lacunar infarction left caudate body.  DaTScan on 12/07/21 was unremarkable.    Patient Stated Goals To not have double vision and walk without dizziness.    Currently in Pain? Yes    Pain Score 4     Pain Location Knee    Pain Orientation Left    Pain Descriptors / Indicators Aching    Pain Type Chronic pain    Pain Score 8    Pain Location Shoulder    Pain Orientation Right    Pain Descriptors / Indicators Aching;Burning    Pain Type Acute pain                               [facility] Adult PT Treatment/Exercise - 12/07/21 0001       High Level Balance   High Level Balance Comments standing on blue foam performing ball toss.  Standing on blue foam performing horizontal head turns 2 x 1 min.  Standing on blue foam and reaching for cone outside BOS and placing it on lower stool. Tandem gait and side stepping in parallel bars on blue airex beam x4.      Lumbar Exercises: Aerobic   Nustep L5 x6' with PT present to discuss progress.             Vestibular Treatment/Exercise - 12/07/21 0001       X1 Viewing Horizontal   Comments VOR 1x1 2x1 min      Eye/Head Exercise Vertical   Comment Eye tracking the alphabet x1 rep.                      PT Short Term Goals - 11/23/21 1029       PT SHORT TERM GOAL #1   Title Pt will be independent with initial vestibular HEP.    Status Achieved               PT Long Term Goals - 12/07/21 1013       PT LONG TERM GOAL #1   Title Pt will be independent with advanced HEP.    Status On-going      PT LONG TERM GOAL #2   Title Pt will complete TUG in 14 sec or less without assistive device to demonstrate improved functional balance.    Status Achieved      PT LONG TERM GOAL #3   Title Pt to have a BERG of at least 50/56 to place her at a lower risk of falling.  Status Partially Met      PT LONG TERM GOAL #4   Title Pt will increase B hip strength to at least 4+/5 to allow her to perform transfers with increased ease.    Status On-going      PT LONG TERM GOAL #5   Title Pt will be able to track with left eye past midline when looking towards right side.    Status Achieved                   Plan - 12/07/21 0946     Clinical Impression Statement Tracy [name] reports that her dizziness feels at least 20% better since initial evaluation. Pt continues to deny anymore double vision.  Pt is progressing with balance, but continues to have dizziness during ther ex.  Pt continues to require skilled PT to progress towards goal related activities.    Personal Factors and Comorbidities Past/Current Experience;Fitness;Age;Comorbidity 1    Comorbidities OA    PT Treatment/Interventions ADLs/Self Care Home Management;Gait training;Stair training;Functional mobility training;Therapeutic activities;Therapeutic exercise;Neuromuscular re-education;Balance training;Patient/family education;Manual techniques;Passive range of motion;Dry needling;Taping;Joint Manipulations;Cryotherapy;Electrical Stimulation;Iontophoresis 1m/ml Dexamethasone;Moist Heat;Canalith Repostioning;Vestibular    PT Next Visit Plan assess and progress HEP.  Vestibular, balance, strengthening.    PT Home Exercise Plan For Strengthening: Access Code [email or web address]  For Vestibular: Access Code: [email or web address]   Consulted and Agree with Plan of Care Patient             Patient will benefit from skilled therapeutic intervention in order to improve the following deficits and impairments:  Difficulty walking, Dizziness, Decreased activity tolerance, Pain, Decreased balance, Decreased strength  Visit Diagnosis: Dizziness and giddiness  Unsteadiness on feet  Repeated falls  Muscle weakness (generalized)  Difficulty in walking, not elsewhere classified     Problem List Patient Active  Problem List   Diagnosis Date Noted   Tremor of both hands 08/16/2021   Aortic atherosclerosis ([place] 06/15/2021   Generalized osteoarthritis of multiple sites 12/25/2020   Chronic pain disorder 12/25/2020   Pelvic mass in female    Bilateral lower extremity edema 04/13/2020   Shortness of breath    Anxiety disorder 10/29/2018   CKD (chronic kidney disease), stage III ([place] 06/27/2018   COPD (chronic obstructive pulmonary disease) ([place] 05/25/2018   Obesity 03/20/2018   Dyspnea on exertion 11/06/2017   Abnormal stress test 11/06/2017   Chronic diastolic CHF (congestive heart failure) ([place] 10/03/2017   Chronic combined systolic and diastolic heart failure ([place] 09/04/2017   Left ventricular dysfunction 07/31/2017   Cataract, nuclear sclerotic senile, bilateral 02/01/2017   Gouty arthritis of toe of left foot 08/09/2016   Hyperuricemia 08/09/2016   Osteoarthritis (arthritis due to wear and tear of joints) 01/14/2014   Coronary artery disease 08/02/2011   Mitral regurgitation 08/02/2011   OVERACTIVE BLADDER 07/02/2010   ACUTE CYSTITIS 05/25/2010   Pure hypercholesterolemia 08/11/2009   PEDAL EDEMA 07/22/2009   GERD 04/11/2008   Essential hypertension 05/30/2007   MYOCARDIAL INFARCTION, HX OF 05/30/2007   Allergic rhinitis 05/30/2007   LOW BACK PAIN 05/30/2007    SJuel Burrow PT, DPT 12/07/2021, 10:14 AM  [facility] [place] [address]Phone: [phone]  Fax:  [phone] Name: BTERIAH MUELAMRN: [id number] of Birth: [birth year]

## 2021-12-09 ENCOUNTER — Ambulatory Visit: Payer: Medicare Other | Admitting: Rehabilitative and Restorative Service Providers"

## 2021-12-09 ENCOUNTER — Encounter: Payer: Self-pay | Admitting: Rehabilitative and Restorative Service Providers"

## 2021-12-09 ENCOUNTER — Other Ambulatory Visit: Payer: Self-pay

## 2021-12-09 ENCOUNTER — Telehealth: Payer: Self-pay | Admitting: Neurology

## 2021-12-09 DIAGNOSIS — M6281 Muscle weakness (generalized): Secondary | ICD-10-CM

## 2021-12-09 DIAGNOSIS — R42 Dizziness and giddiness: Secondary | ICD-10-CM | POA: Diagnosis not present

## 2021-12-09 DIAGNOSIS — R296 Repeated falls: Secondary | ICD-10-CM

## 2021-12-09 DIAGNOSIS — R262 Difficulty in walking, not elsewhere classified: Secondary | ICD-10-CM | POA: Diagnosis not present

## 2021-12-09 DIAGNOSIS — R2681 Unsteadiness on feet: Secondary | ICD-10-CM | POA: Diagnosis not present

## 2021-12-09 NOTE — Therapy (Signed)
[facility] @ [facility] [place], [place], [address] Phone: [phone]   Fax:  [phone]  Physical Therapy Treatment  Patient Details  Name: Tracy [name] MRN: [id number] Date of Birth: 1945/10/26 Referring Provider (PT): [name], MD   Encounter Date: 12/09/2021   PT End of Session - 12/09/21 0909     Visit Number 9    Date for PT Re-Evaluation 12/24/21    Authorization Type Medicare    Authorization Time Period 10/10/2021-11/05/21    PT Start Time 0905    PT Stop Time 0945    PT Time Calculation (min) 40 min    Activity Tolerance Patient tolerated treatment well    Behavior During Therapy [facility] for tasks assessed/performed             Past Medical History:  Diagnosis Date   Allergic rhinitis    Allergy    Anxiety    Arthritis    Bronchitis    CAD (coronary artery disease)    1/19 PCI/DES to [place] for ISR, normal EF.    Cancer ([place])    hx of precancerous cells in right breast    Cataract    Cervical dysplasia    unsure of procedure, possible "burning" in her late 35s   CHF (congestive heart failure) ([place])    Echo 06/2019: EF 55-60, elevated LVEDP, normal RV SF, mild MAC, mild MR, trivial TR   Complication of anesthesia    COPD (chronic obstructive pulmonary disease) (HCC)    early    Depression    Dyspnea    with exertion    Family history of adverse reaction to anesthesia    daughter- problems wiht n/v   GERD (gastroesophageal reflux disease)    Gout    Headache(784.0)    Heart murmur    hx of years ago    Hyperlipidemia    Hypertension    Low back pain    Menopausal syndrome    Myocardial infarct ([place]) 2007   hx of   Overactive bladder    PONV (postoperative nausea and vomiting)    Sleep apnea     Past Surgical History:  Procedure Laterality Date   ANGIOPLASTY     stent 2007   BREAST LUMPECTOMY WITH RADIOACTIVE SEED LOCALIZATION Right 04/28/2020   Procedure: RIGHT BREAST  LUMPECTOMY X 2  WITH RADIOACTIVE SEED LOCALIZATION;  Surgeon: Donnie Mesa, MD;  Location: [place];  Service: General;  Laterality: Right;  LMA   CARDIAC CATHETERIZATION     COLONOSCOPY     CORONARY STENT INTERVENTION N/A 11/06/2017   Procedure: CORONARY STENT INTERVENTION;  Surgeon: [name], MD;  Location: [place] [facility];  Service: Cardiovascular;  Laterality: N/A;   LEFT HEART CATH AND CORONARY ANGIOGRAPHY N/A 11/06/2017   Procedure: LEFT HEART CATH AND CORONARY ANGIOGRAPHY;  Surgeon: [name], MD;  Location: [facility] [facility];  Service: Cardiovascular;  Laterality: N/A;   LEFT HEART CATH AND CORONARY ANGIOGRAPHY N/A 07/24/2019   Procedure: LEFT HEART CATH AND CORONARY ANGIOGRAPHY;  Surgeon: Burnell Blanks, MD;  Location: [place] [facility];  Service: Cardiovascular;  Laterality: N/A;   RIGHT/LEFT HEART CATH AND CORONARY ANGIOGRAPHY N/A 09/29/2020   Procedure: RIGHT/LEFT HEART CATH AND CORONARY ANGIOGRAPHY;  Surgeon: Belva Crome, MD;  Location: [place] [facility];  Service: Cardiovascular;  Laterality: N/A;   ROBOTIC ASSISTED BILATERAL SALPINGO OOPHERECTOMY Bilateral 11/10/2020   Procedure: XI ROBOTIC ASSISTED BILATERAL SALPINGO  OOPHORECTOMY WITH MINI LAPAROTOMY FOR DRAINAGE;  Surgeon: Lafonda Mosses, MD;  [facility];  Service: Gynecology;  Laterality: Bilateral;  MINI LAP FIRST   TONSILLECTOMY     VULVECTOMY N/A 11/10/2020   Procedure: WIDE EXCISION VULVECTOMY;  Surgeon: Lafonda Mosses, MD;  [facility];  Service: Gynecology;  Laterality: N/A;    There were no vitals filed for this visit.   Subjective Assessment - 12/09/21 0912     Subjective I am still having dizziness, it is no better and no worse.    Pertinent History CAD, arthritis    Diagnostic tests MRI on 11/27/21 IMPRESSION:  No acute or reversible finding. Mild chronic small-vessel ischemic  changes of the pons and cerebellum. Moderate chronic small-vessel   ischemic changes of the cerebral hemispheric white matter. Old  lacunar infarction left caudate body.  DaTScan on 12/07/21 was unremarkable.    Patient Stated Goals To not have double vision and walk without dizziness.    Currently in Pain? Yes    Pain Score 2     Pain Location Knee    Pain Orientation Left    Pain Descriptors / Indicators Aching    Pain Type Chronic pain    Pain Score 4    Pain Location Shoulder    Pain Orientation Right    Pain Descriptors / Indicators Aching;Burning    Pain Type Acute pain                               [facility] Adult PT Treatment/Exercise - 12/09/21 0001       Ambulation/Gait   Gait Comments Ambulation outside down sidewalk in front of building without assistive device with SBA with slow gait velocity noted.      High Level Balance   High Level Balance Comments standing on blue foam performing ball toss.  Standing on blue foam performing horizontal then vertical head turns 2 x 1 min each.  Tandem gait and side stepping in parallel bars on blue airex beam x4.  Ambulation 2 x 10 ft with horizontal head turns and CGA secondary to unsteadiness.      Lumbar Exercises: Aerobic   Nustep L5 x6' with PT present to discuss progress.   391 steps     Knee/Hip Exercises: Standing   Other Standing Knee Exercises Atl LE taps to 2 cones x10 bilat with CGA.                       PT Short Term Goals - 11/23/21 1029       PT SHORT TERM GOAL #1   Title Pt will be independent with initial vestibular HEP.    Status Achieved               PT Long Term Goals - 12/07/21 1013       PT LONG TERM GOAL #1   Title Pt will be independent with advanced HEP.    Status On-going      PT LONG TERM GOAL #2   Title Pt will complete TUG in 14 sec or less without assistive device to demonstrate improved functional balance.    Status Achieved      PT LONG TERM GOAL #3   Title Pt to have a BERG of at least 50/56 to place her at a  lower risk of falling.    Status Partially Met  PT LONG TERM GOAL #4   Title Pt will increase B hip strength to at least 4+/5 to allow her to perform transfers with increased ease.    Status On-going      PT LONG TERM GOAL #5   Title Pt will be able to track with left eye past midline when looking towards right side.    Status Achieved                   Plan - 12/09/21 0957     Clinical Impression Statement Ms Donavan continues to have dizziness, especially during exercises with head turns and activities that challenge her vestibular system.  Pt continues to progress with balance and was able to ambulate outside without assistive device today, but does have slow gait velocity noted.  Pt continues with difficulty with dynamic balance standing on blue foam.  Pt continues to require skilled PT to progress towards goal related activities.    Personal Factors and Comorbidities Past/Current Experience;Fitness;Age;Comorbidity 1    Comorbidities OA    PT Treatment/Interventions ADLs/Self Care Home Management;Gait training;Stair training;Functional mobility training;Therapeutic activities;Therapeutic exercise;Neuromuscular re-education;Balance training;Patient/family education;Manual techniques;Passive range of motion;Dry needling;Taping;Joint Manipulations;Cryotherapy;Electrical Stimulation;Iontophoresis 61m/ml Dexamethasone;Moist Heat;Canalith Repostioning;Vestibular    PT Next Visit Plan assess and progress HEP as indicated.  Vestibular, balance, strengthening.    PT Home Exercise Plan For Strengthening: Access Code [email or web address]  For Vestibular: Access Code: [email or web address]   Consulted and Agree with Plan of Care Patient             Patient will benefit from skilled therapeutic intervention in order to improve the following deficits and impairments:  Difficulty walking, Dizziness, Decreased activity tolerance, Pain, Decreased balance, Decreased strength  Visit Diagnosis: Dizziness and  giddiness  Unsteadiness on feet  Repeated falls  Muscle weakness (generalized)  Difficulty in walking, not elsewhere classified     Problem List Patient Active Problem List   Diagnosis Date Noted   Tremor of both hands 08/16/2021   Aortic atherosclerosis ([place] 06/15/2021   Generalized osteoarthritis of multiple sites 12/25/2020   Chronic pain disorder 12/25/2020   Pelvic mass in female    Bilateral lower extremity edema 04/13/2020   Shortness of breath    Anxiety disorder 10/29/2018   CKD (chronic kidney disease), stage III ([place] 06/27/2018   COPD (chronic obstructive pulmonary disease) ([place] 05/25/2018   Obesity 03/20/2018   Dyspnea on exertion 11/06/2017   Abnormal stress test 11/06/2017   Chronic diastolic CHF (congestive heart failure) ([place] 10/03/2017   Chronic combined systolic and diastolic heart failure ([place] 09/04/2017   Left ventricular dysfunction 07/31/2017   Cataract, nuclear sclerotic senile, bilateral 02/01/2017   Gouty arthritis of toe of left foot 08/09/2016   Hyperuricemia 08/09/2016   Osteoarthritis (arthritis due to wear and tear of joints) 01/14/2014   Coronary artery disease 08/02/2011   Mitral regurgitation 08/02/2011   OVERACTIVE BLADDER 07/02/2010   ACUTE CYSTITIS 05/25/2010   Pure hypercholesterolemia 08/11/2009   PEDAL EDEMA 07/22/2009   GERD 04/11/2008   Essential hypertension 05/30/2007   MYOCARDIAL INFARCTION, HX OF 05/30/2007   Allergic rhinitis 05/30/2007   LOW BACK PAIN 05/30/2007    SJuel Burrow PT 12/09/2021, 10:00 AM  [facility] [place] [address]Phone: [phone]  Fax:  [phone] Name: BARIEA ROCHINMRN: [id number] of Birth: [birth year]

## 2021-12-09 NOTE — Telephone Encounter (Signed)
Paperwork faxed to [phone]

## 2021-12-09 NOTE — Telephone Encounter (Signed)
Levada Dy from [facility] called in stating they got a referral from [place] and would like the MRI results sent to them. Please send to [place].

## 2021-12-14 ENCOUNTER — Other Ambulatory Visit: Payer: Self-pay

## 2021-12-14 ENCOUNTER — Encounter: Payer: Self-pay | Admitting: Rehabilitative and Restorative Service Providers"

## 2021-12-14 ENCOUNTER — Ambulatory Visit: Payer: Medicare Other | Admitting: Rehabilitative and Restorative Service Providers"

## 2021-12-14 DIAGNOSIS — R262 Difficulty in walking, not elsewhere classified: Secondary | ICD-10-CM

## 2021-12-14 DIAGNOSIS — R42 Dizziness and giddiness: Secondary | ICD-10-CM | POA: Diagnosis not present

## 2021-12-14 DIAGNOSIS — M6281 Muscle weakness (generalized): Secondary | ICD-10-CM | POA: Diagnosis not present

## 2021-12-14 DIAGNOSIS — R296 Repeated falls: Secondary | ICD-10-CM

## 2021-12-14 DIAGNOSIS — R2681 Unsteadiness on feet: Secondary | ICD-10-CM | POA: Diagnosis not present

## 2021-12-14 NOTE — Therapy (Signed)
[facility] @ [address] [place], [place], [address] Phone: [phone]   Fax:  [phone]  Physical Therapy Treatment  Patient Details  Name: Tracy [name] MRN: [id number] Date of Birth: [birth year] Referring Provider (PT): [name], MD   Encounter Date: 12/14/2021   PT End of Session - 12/14/21 0934     Visit Number 10    Date for PT Re-Evaluation 12/24/21    Authorization Type Medicare    Progress Note Due on Visit 74    PT Start Time 0930    PT Stop Time 1010    PT Time Calculation (min) 40 min    Activity Tolerance Patient tolerated treatment well    Behavior During Therapy [facility] for tasks assessed/performed             Past Medical History:  Diagnosis Date   Allergic rhinitis    Allergy    Anxiety    Arthritis    Bronchitis    CAD (coronary artery disease)    1/19 PCI/DES to [place] for ISR, normal EF.    Cancer ([place])    hx of precancerous cells in right breast    Cataract    Cervical dysplasia    unsure of procedure, possible "burning" in her late 65s   CHF (congestive heart failure) ([place])    Echo 06/2019: EF 55-60, elevated LVEDP, normal RV SF, mild MAC, mild MR, trivial TR   Complication of anesthesia    COPD (chronic obstructive pulmonary disease) (HCC)    early    Depression    Dyspnea    with exertion    Family history of adverse reaction to anesthesia    daughter- problems wiht n/v   GERD (gastroesophageal reflux disease)    Gout    Headache(784.0)    Heart murmur    hx of years ago    Hyperlipidemia    Hypertension    Low back pain    Menopausal syndrome    Myocardial infarct ([place]) 2007   hx of   Overactive bladder    PONV (postoperative nausea and vomiting)    Sleep apnea     Past Surgical History:  Procedure Laterality Date   ANGIOPLASTY     stent 2007   BREAST LUMPECTOMY WITH RADIOACTIVE SEED LOCALIZATION Right 04/28/2020   Procedure: RIGHT BREAST LUMPECTOMY X 2   WITH RADIOACTIVE SEED LOCALIZATION;  Surgeon: Donnie Mesa, MD;  Location: [place];  Service: General;  Laterality: Right;  LMA   CARDIAC CATHETERIZATION     COLONOSCOPY     CORONARY STENT INTERVENTION N/A 11/06/2017   Procedure: CORONARY STENT INTERVENTION;  Surgeon: [name], MD;  Location: [place] [facility];  Service: Cardiovascular;  Laterality: N/A;   LEFT HEART CATH AND CORONARY ANGIOGRAPHY N/A 11/06/2017   Procedure: LEFT HEART CATH AND CORONARY ANGIOGRAPHY;  Surgeon: [name], MD;  Location: [place] [facility];  Service: Cardiovascular;  Laterality: N/A;   LEFT HEART CATH AND CORONARY ANGIOGRAPHY N/A 07/24/2019   Procedure: LEFT HEART CATH AND CORONARY ANGIOGRAPHY;  Surgeon: Burnell Blanks, MD;  Location: [place] [facility];  Service: Cardiovascular;  Laterality: N/A;   RIGHT/LEFT HEART CATH AND CORONARY ANGIOGRAPHY N/A 09/29/2020   Procedure: RIGHT/LEFT HEART CATH AND CORONARY ANGIOGRAPHY;  Surgeon: Belva Crome, MD;  Location: [place] [facility];  Service: Cardiovascular;  Laterality: N/A;   ROBOTIC ASSISTED BILATERAL SALPINGO OOPHERECTOMY Bilateral 11/10/2020   Procedure: XI ROBOTIC ASSISTED  BILATERAL SALPINGO OOPHORECTOMY WITH MINI LAPAROTOMY FOR DRAINAGE;  Surgeon: Lafonda Mosses, MD;  [facility];  Service: Gynecology;  Laterality: Bilateral;  MINI LAP FIRST   TONSILLECTOMY     VULVECTOMY N/A 11/10/2020   Procedure: WIDE EXCISION VULVECTOMY;  Surgeon: Lafonda Mosses, MD;  Location: [facility];  Service: Gynecology;  Laterality: N/A;    There were no vitals filed for this visit.   Subjective Assessment - 12/14/21 0935     Subjective Pt reports that she continues to have dizziness, but states that it is not any worse.    Pertinent History CAD, arthritis    Currently in Pain? Yes    Pain Score 2     Pain Orientation Left    Pain Descriptors / Indicators Aching    Pain Type Chronic pain    Pain Score 4    Pain Location  Shoulder    Pain Orientation Right    Pain Descriptors / Indicators Aching;Burning    Pain Type Acute pain                [facility] PT Assessment - 12/14/21 0001       Strength   Overall Strength Comments R hip strength of 4+/5, L hip strength of 4 to 4+/5 grossly throughout      [employer]   Sit to Stand Able to stand without using hands and stabilize independently    Standing Unsupported Able to stand safely 2 minutes    Sitting with Back Unsupported but Feet Supported on Floor or Stool Able to sit safely and securely 2 minutes    Stand to Sit Sits safely with minimal use of hands    Transfers Able to transfer safely, minor use of hands    Standing Unsupported with Eyes Closed Able to stand 10 seconds safely    Standing Unsupported with Feet Together Able to place feet together independently and stand 1 minute safely    From Standing, Reach Forward with Outstretched Arm Can reach forward >12 cm safely (5")    From Standing Position, Pick up Object from Floor Able to pick up shoe safely and easily    From Standing Position, Turn to Look Behind Over each Shoulder Looks behind from both sides and weight shifts well    Turn 360 Degrees Able to turn 360 degrees safely one side only in 4 seconds or less    Standing Unsupported, Alternately Place Feet on Step/Stool Able to stand independently and complete 8 steps >20 seconds    Standing Unsupported, One Foot in Front Able to take small step independently and hold 30 seconds    Standing on One Leg Able to lift leg independently and hold equal to or more than 3 seconds    Total Score 49                           [facility] Adult PT Treatment/Exercise - 12/14/21 0001       High Level Balance   High Level Balance Comments Standing on blue foam:  row and shoulder extension with red tband 2x10 each.      Lumbar Exercises: Aerobic   Nustep L5 x6' with PT present to discuss progress.             Vestibular  Treatment/Exercise - 12/14/21 0001       Vestibular Treatment/Exercise   Gaze Exercises Eye/Head Exercise Horizontal;Eye/Head Exercise Vertical;Comment  X1 Viewing Horizontal   Comments Standing on blue foam performing horizontal head turns x10.      Eye/Head Exercise Vertical   Comments Standing on blue foam performing horizontal head turns x10.    Comment Eye tracking the alphabet x1 rep standing on blue foam. Walking down hallway performing vertical and horizontal head turns x2 laps each.                      PT Short Term Goals - 12/14/21 1006       PT SHORT TERM GOAL #1   Title Pt will be independent with initial vestibular HEP.    Status Achieved               PT Long Term Goals - 12/14/21 1006       PT LONG TERM GOAL #1   Title Pt will be independent with advanced HEP.    Status Partially Met      PT LONG TERM GOAL #2   Title Pt will complete TUG in 14 sec or less without assistive device to demonstrate improved functional balance.    Status Achieved      PT LONG TERM GOAL #3   Title Pt to have a BERG of at least 50/56 to place her at a lower risk of falling.    Status Partially Met   49/56     PT LONG TERM GOAL #4   Title Pt will increase B hip strength to at least 4+/5 to allow her to perform transfers with increased ease.    Status Partially Met      PT LONG TERM GOAL #5   Title Pt will be able to track with left eye past midline when looking towards right side.    Status Achieved                   Plan - 12/14/21 1011     Clinical Impression Statement Ms Randa continues to progress towards goal related activities.  Overall, she states that her dizziness is 40% better than when she first started to experience symptoms.  Pt with BERG of 49/56 noted during session today.  Pt continues to progress with vestibular exercises, but states that ambulation with vertical head turns is more challenging than other exercises. Pt  continues to require skilled PT to progress towards improved safety at home.    Personal Factors and Comorbidities Past/Current Experience;Fitness;Age;Comorbidity 1    Comorbidities OA    PT Treatment/Interventions ADLs/Self Care Home Management;Gait training;Stair training;Functional mobility training;Therapeutic activities;Therapeutic exercise;Neuromuscular re-education;Balance training;Patient/family education;Manual techniques;Passive range of motion;Dry needling;Taping;Joint Manipulations;Cryotherapy;Electrical Stimulation;Iontophoresis 13m/ml Dexamethasone;Moist Heat;Canalith Repostioning;Vestibular    PT Next Visit Plan assess and progress HEP as indicated.  Vestibular, balance, strengthening.    Consulted and Agree with Plan of Care Patient             Patient will benefit from skilled therapeutic intervention in order to improve the following deficits and impairments:  Difficulty walking, Dizziness, Decreased activity tolerance, Pain, Decreased balance, Decreased strength  Visit Diagnosis: Dizziness and giddiness  Unsteadiness on feet  Repeated falls  Muscle weakness (generalized)  Difficulty in walking, not elsewhere classified     Problem List Patient Active Problem List   Diagnosis Date Noted   Tremor of both hands 08/16/2021   Aortic atherosclerosis ([place] 06/15/2021   Generalized osteoarthritis of multiple sites 12/25/2020   Chronic pain disorder 12/25/2020   Pelvic mass in female    Bilateral  lower extremity edema 04/13/2020   Shortness of breath    Anxiety disorder 10/29/2018   CKD (chronic kidney disease), stage III ([place]) 06/27/2018   COPD (chronic obstructive pulmonary disease) ([place]) 05/25/2018   Obesity 03/20/2018   Dyspnea on exertion 11/06/2017   Abnormal stress test 11/06/2017   Chronic diastolic CHF (congestive heart failure) ([place]) 10/03/2017   Chronic combined systolic and diastolic heart failure ([place]) 09/04/2017   Left ventricular dysfunction  07/31/2017   Cataract, nuclear sclerotic senile, bilateral 02/01/2017   Gouty arthritis of toe of left foot 08/09/2016   Hyperuricemia 08/09/2016   Osteoarthritis (arthritis due to wear and tear of joints) 01/14/2014   Coronary artery disease 08/02/2011   Mitral regurgitation 08/02/2011   OVERACTIVE BLADDER 07/02/2010   ACUTE CYSTITIS 05/25/2010   Pure hypercholesterolemia 08/11/2009   PEDAL EDEMA 07/22/2009   GERD 04/11/2008   Essential hypertension 05/30/2007   MYOCARDIAL INFARCTION, HX OF 05/30/2007   Allergic rhinitis 05/30/2007   LOW BACK PAIN 05/30/2007    Juel Burrow, PT 12/14/2021, 10:14 AM  [facility] @ [address] [place], [place], [address] Phone: [phone]   Fax:  [phone]  Name: EYVETTE CORDON MRN: [id number] Date of Birth: 1945-10-26

## 2021-12-15 DIAGNOSIS — U071 COVID-19: Secondary | ICD-10-CM | POA: Diagnosis not present

## 2021-12-16 ENCOUNTER — Other Ambulatory Visit: Payer: Self-pay

## 2021-12-16 ENCOUNTER — Encounter: Payer: Self-pay | Admitting: Rehabilitative and Restorative Service Providers"

## 2021-12-16 ENCOUNTER — Ambulatory Visit: Payer: Medicare Other | Attending: Family Medicine | Admitting: Rehabilitative and Restorative Service Providers"

## 2021-12-16 DIAGNOSIS — R42 Dizziness and giddiness: Secondary | ICD-10-CM | POA: Insufficient documentation

## 2021-12-16 DIAGNOSIS — R2681 Unsteadiness on feet: Secondary | ICD-10-CM | POA: Diagnosis not present

## 2021-12-16 DIAGNOSIS — R262 Difficulty in walking, not elsewhere classified: Secondary | ICD-10-CM | POA: Insufficient documentation

## 2021-12-16 DIAGNOSIS — M6281 Muscle weakness (generalized): Secondary | ICD-10-CM | POA: Diagnosis not present

## 2021-12-16 DIAGNOSIS — R296 Repeated falls: Secondary | ICD-10-CM | POA: Insufficient documentation

## 2021-12-16 NOTE — Therapy (Signed)
[facility] @ [address] [place], [place], [address] Phone: [phone]   Fax:  [phone]  Physical Therapy Treatment  Patient Details  Name: Tracy [name] MRN: [id number] Date of Birth: 1946-06-27 Referring Provider (PT): [name], MD   Encounter Date: 12/16/2021   PT End of Session - 12/16/21 0915     Visit Number 11    Date for PT Re-Evaluation 12/24/21    Authorization Type Medicare    Progress Note Due on Visit 37    PT Start Time 0915    PT Stop Time 0955    PT Time Calculation (min) 40 min    Activity Tolerance Patient tolerated treatment well    Behavior During Therapy [facility] for tasks assessed/performed             Past Medical History:  Diagnosis Date   Allergic rhinitis    Allergy    Anxiety    Arthritis    Bronchitis    CAD (coronary artery disease)    1/19 PCI/DES to [place] for ISR, normal EF.    Cancer ([place])    hx of precancerous cells in right breast    Cataract    Cervical dysplasia    unsure of procedure, possible "burning" in her late 60s   CHF (congestive heart failure) ([place])    Echo 06/2019: EF 55-60, elevated LVEDP, normal RV SF, mild MAC, mild MR, trivial TR   Complication of anesthesia    COPD (chronic obstructive pulmonary disease) (HCC)    early    Depression    Dyspnea    with exertion    Family history of adverse reaction to anesthesia    daughter- problems wiht n/v   GERD (gastroesophageal reflux disease)    Gout    Headache(784.0)    Heart murmur    hx of years ago    Hyperlipidemia    Hypertension    Low back pain    Menopausal syndrome    Myocardial infarct ([place]) 2007   hx of   Overactive bladder    PONV (postoperative nausea and vomiting)    Sleep apnea     Past Surgical History:  Procedure Laterality Date   ANGIOPLASTY     stent 2007   BREAST LUMPECTOMY WITH RADIOACTIVE SEED LOCALIZATION Right 04/28/2020   Procedure: RIGHT BREAST LUMPECTOMY X 2   WITH RADIOACTIVE SEED LOCALIZATION;  Surgeon: Donnie Mesa, MD;  Location: [facility];  Service: General;  Laterality: Right;  LMA   CARDIAC CATHETERIZATION     COLONOSCOPY     CORONARY STENT INTERVENTION N/A 11/06/2017   Procedure: CORONARY STENT INTERVENTION;  Surgeon: [name], MD;  Location: [facility];  Service: Cardiovascular;  Laterality: N/A;   LEFT HEART CATH AND CORONARY ANGIOGRAPHY N/A 11/06/2017   Procedure: LEFT HEART CATH AND CORONARY ANGIOGRAPHY;  Surgeon: [name], MD;  Location: [facility] [facility];  Service: Cardiovascular;  Laterality: N/A;   LEFT HEART CATH AND CORONARY ANGIOGRAPHY N/A 07/24/2019   Procedure: LEFT HEART CATH AND CORONARY ANGIOGRAPHY;  Surgeon: Burnell Blanks, MD;  Location: [facility] [facility];  Service: Cardiovascular;  Laterality: N/A;   RIGHT/LEFT HEART CATH AND CORONARY ANGIOGRAPHY N/A 09/29/2020   Procedure: RIGHT/LEFT HEART CATH AND CORONARY ANGIOGRAPHY;  Surgeon: Belva Crome, MD;  Location: [place] [facility];  Service: Cardiovascular;  Laterality: N/A;   ROBOTIC ASSISTED BILATERAL SALPINGO OOPHERECTOMY Bilateral 11/10/2020   Procedure: XI ROBOTIC ASSISTED  BILATERAL SALPINGO OOPHORECTOMY WITH MINI LAPAROTOMY FOR DRAINAGE;  Surgeon: Lafonda Mosses, MD;  [facility];  Service: Gynecology;  Laterality: Bilateral;  MINI LAP FIRST   TONSILLECTOMY     VULVECTOMY N/A 11/10/2020   Procedure: WIDE EXCISION VULVECTOMY;  Surgeon: Lafonda Mosses, MD;  [facility];  Service: Gynecology;  Laterality: N/A;    There were no vitals filed for this visit.   Subjective Assessment - 12/16/21 0916     Subjective Pt reports feeling stiff this morning.  States she stil has dizziness, but it is not constant.    Pertinent History CAD, arthritis    Patient Stated Goals To not have double vision and walk without dizziness.    Currently in Pain? Yes    Pain Score 9     Pain Location Shoulder    Pain  Orientation Right    Pain Descriptors / Indicators Aching;Sore    Pain Type Acute pain    Pain Score 2    Pain Location Knee    Pain Orientation Left    Pain Descriptors / Indicators Dull    Pain Type Acute pain                     Vestibular Assessment - 12/16/21 0001       Positional Testing   Dix-Hallpike Dix-Hallpike Right;Dix-Hallpike Left      Dix-Hallpike Right   Dix-Hallpike Right Duration negative      Dix-Hallpike Left   Dix-Hallpike Left Duration negative                      [facility] Adult PT Treatment/Exercise - 12/16/21 0001       Lumbar Exercises: Aerobic   Nustep L5 x6' with PT present to discuss progress.             Vestibular Treatment/Exercise - 12/16/21 0001       Vestibular Treatment/Exercise   Habituation Exercises Comment;Standing Horizontal Head Turns   Standing on foam with eyes closed 2 x 12 sec, 2 x 30 sec.     Standing Horizontal Head Turns   Number of Reps  10    Symptom Description  standing on foam      Eye/Head Exercise Vertical   Comment Walking down hallway performing vertical and horizontal head turns x2 laps each.                      PT Short Term Goals - 12/14/21 1006       PT SHORT TERM GOAL #1   Title Pt will be independent with initial vestibular HEP.    Status Achieved               PT Long Term Goals - 12/14/21 1006       PT LONG TERM GOAL #1   Title Pt will be independent with advanced HEP.    Status Partially Met      PT LONG TERM GOAL #2   Title Pt will complete TUG in 14 sec or less without assistive device to demonstrate improved functional balance.    Status Achieved      PT LONG TERM GOAL #3   Title Pt to have a BERG of at least 50/56 to place her at a lower risk of falling.    Status Partially Met   49/56     PT LONG TERM GOAL #4   Title Pt will increase B  hip strength to at least 4+/5 to allow her to perform transfers with increased ease.    Status  Partially Met      PT LONG TERM GOAL #5   Title Pt will be able to track with left eye past midline when looking towards right side.    Status Achieved                   Plan - 12/16/21 1002     Clinical Impression Statement Ms Shimada started off session feeling stiff, but at completion of session, pt reports feeling better and more stable then when she entered PT.  Pt tolerated session well and was negative with Marye Round on bilateral sides.  Education provided on vestibular hypofunctioning and how vestibular rehab plays a roll in strengthening the vestibular system. Pt states that she has not started driving again yet, but hopes to resume driving soon.  Advised pt to first try driving with practicing looking over her shoulder in an empty parking lot prior to driving on busy roads and she verbalizes her understanding.  Pt continues to require skilled PT to progress towards goal related activities.    Personal Factors and Comorbidities Past/Current Experience;Fitness;Age;Comorbidity 1    Comorbidities OA    PT Treatment/Interventions ADLs/Self Care Home Management;Gait training;Stair training;Functional mobility training;Therapeutic activities;Therapeutic exercise;Neuromuscular re-education;Balance training;Patient/family education;Manual techniques;Passive range of motion;Dry needling;Taping;Joint Manipulations;Cryotherapy;Electrical Stimulation;Iontophoresis 49m/ml Dexamethasone;Moist Heat;Canalith Repostioning;Vestibular    PT Next Visit Plan assess and progress HEP as indicated.  Vestibular, balance, strengthening.    Consulted and Agree with Plan of Care Patient             Patient will benefit from skilled therapeutic intervention in order to improve the following deficits and impairments:  Difficulty walking, Dizziness, Decreased activity tolerance, Pain, Decreased balance, Decreased strength  Visit Diagnosis: Dizziness and giddiness  Unsteadiness on feet  Repeated  falls  Muscle weakness (generalized)  Difficulty in walking, not elsewhere classified     Problem List Patient Active Problem List   Diagnosis Date Noted   Tremor of both hands 08/16/2021   [place] ([place] 06/15/2021   Generalized osteoarthritis of multiple sites 12/25/2020   Chronic pain disorder 12/25/2020   Pelvic mass in female    Bilateral lower extremity edema 04/13/2020   Shortness of breath    Anxiety disorder 10/29/2018   CKD (chronic kidney disease), stage III ([place] 06/27/2018   COPD (chronic obstructive pulmonary disease) ([place] 05/25/2018   Obesity 03/20/2018   Dyspnea on exertion 11/06/2017   Abnormal stress test 11/06/2017   Chronic diastolic CHF (congestive heart failure) ([place] 10/03/2017   Chronic combined systolic and diastolic heart failure ([place] 09/04/2017   Left ventricular dysfunction 07/31/2017   Cataract, nuclear sclerotic senile, bilateral 02/01/2017   Gouty arthritis of toe of left foot 08/09/2016   Hyperuricemia 08/09/2016   Osteoarthritis (arthritis due to wear and tear of joints) 01/14/2014   Coronary artery disease 08/02/2011   Mitral regurgitation 08/02/2011   OVERACTIVE BLADDER 07/02/2010   ACUTE CYSTITIS 05/25/2010   Pure hypercholesterolemia 08/11/2009   PEDAL EDEMA 07/22/2009   GERD 04/11/2008   Essential hypertension 05/30/2007   MYOCARDIAL INFARCTION, HX OF 05/30/2007   Allergic rhinitis 05/30/2007   LOW BACK PAIN 05/30/2007    SJuel Burrow PT 12/16/2021, 10:06 AM  [email or web address] [address]Phone: [phone]  Fax:  [phone] Name: Tracy HORVATHMRN: [id number] of Birth:  Aug 30, 1946

## 2021-12-21 ENCOUNTER — Ambulatory Visit: Payer: Medicare Other | Admitting: Rehabilitative and Restorative Service Providers"

## 2021-12-21 ENCOUNTER — Encounter: Payer: Self-pay | Admitting: Rehabilitative and Restorative Service Providers"

## 2021-12-21 ENCOUNTER — Other Ambulatory Visit: Payer: Self-pay

## 2021-12-21 DIAGNOSIS — R42 Dizziness and giddiness: Secondary | ICD-10-CM

## 2021-12-21 DIAGNOSIS — R262 Difficulty in walking, not elsewhere classified: Secondary | ICD-10-CM | POA: Diagnosis not present

## 2021-12-21 DIAGNOSIS — M6281 Muscle weakness (generalized): Secondary | ICD-10-CM

## 2021-12-21 DIAGNOSIS — R2681 Unsteadiness on feet: Secondary | ICD-10-CM

## 2021-12-21 DIAGNOSIS — R296 Repeated falls: Secondary | ICD-10-CM | POA: Diagnosis not present

## 2021-12-21 NOTE — Therapy (Signed)
[facility] @ [facility] [place], [place], [address] Phone: [phone]   Fax:  [phone]  Physical Therapy Treatment  Patient Details  Name: Tracy [name] MRN: [id number] Date of Birth: [birth year] Referring Provider (PT): [name], MD   Encounter Date: 12/21/2021   PT End of Session - 12/21/21 0930     Visit Number 12    Date for PT Re-Evaluation 12/24/21    Authorization Type Medicare    Progress Note Due on Visit 5    PT Start Time 0927    PT Stop Time 1005    PT Time Calculation (min) 38 min    Activity Tolerance Patient tolerated treatment well    Behavior During Therapy [facility] for tasks assessed/performed             Past Medical History:  Diagnosis Date   Allergic rhinitis    Allergy    Anxiety    Arthritis    Bronchitis    CAD (coronary artery disease)    1/19 PCI/DES to [place] for ISR, normal EF.    Cancer ([place])    hx of precancerous cells in right breast    Cataract    Cervical dysplasia    unsure of procedure, possible "burning" in her late 21s   CHF (congestive heart failure) ([place])    Echo 06/2019: EF 55-60, elevated LVEDP, normal RV SF, mild MAC, mild MR, trivial TR   Complication of anesthesia    COPD (chronic obstructive pulmonary disease) (HCC)    early    Depression    Dyspnea    with exertion    Family history of adverse reaction to anesthesia    daughter- problems wiht n/v   GERD (gastroesophageal reflux disease)    Gout    Headache(784.0)    Heart murmur    hx of years ago    Hyperlipidemia    Hypertension    Low back pain    Menopausal syndrome    Myocardial infarct ([place]) 2007   hx of   Overactive bladder    PONV (postoperative nausea and vomiting)    Sleep apnea     Past Surgical History:  Procedure Laterality Date   ANGIOPLASTY     stent 2007   BREAST LUMPECTOMY WITH RADIOACTIVE SEED LOCALIZATION Right 04/28/2020   Procedure: RIGHT BREAST LUMPECTOMY X 2   WITH RADIOACTIVE SEED LOCALIZATION;  Surgeon: Donnie Mesa, MD;  Location: [place];  Service: General;  Laterality: Right;  LMA   CARDIAC CATHETERIZATION     COLONOSCOPY     CORONARY STENT INTERVENTION N/A 11/06/2017   Procedure: CORONARY STENT INTERVENTION;  Surgeon: [name], MD;  Location: [place] [facility];  Service: Cardiovascular;  Laterality: N/A;   LEFT HEART CATH AND CORONARY ANGIOGRAPHY N/A 11/06/2017   Procedure: LEFT HEART CATH AND CORONARY ANGIOGRAPHY;  Surgeon: [name], MD;  Location: [place] [facility];  Service: Cardiovascular;  Laterality: N/A;   LEFT HEART CATH AND CORONARY ANGIOGRAPHY N/A 07/24/2019   Procedure: LEFT HEART CATH AND CORONARY ANGIOGRAPHY;  Surgeon: Burnell Blanks, MD;  Location: [place] [facility];  Service: Cardiovascular;  Laterality: N/A;   RIGHT/LEFT HEART CATH AND CORONARY ANGIOGRAPHY N/A 09/29/2020   Procedure: RIGHT/LEFT HEART CATH AND CORONARY ANGIOGRAPHY;  Surgeon: Belva Crome, MD;  Location: [place] [facility];  Service: Cardiovascular;  Laterality: N/A;   ROBOTIC ASSISTED BILATERAL SALPINGO OOPHERECTOMY Bilateral 11/10/2020   Procedure: XI ROBOTIC ASSISTED  BILATERAL SALPINGO OOPHORECTOMY WITH MINI LAPAROTOMY FOR DRAINAGE;  Surgeon: Lafonda Mosses, MD;  [facility];  Service: Gynecology;  Laterality: Bilateral;  MINI LAP FIRST   TONSILLECTOMY     VULVECTOMY N/A 11/10/2020   Procedure: WIDE EXCISION VULVECTOMY;  Surgeon: Lafonda Mosses, MD;  Location: [facility];  Service: Gynecology;  Laterality: N/A;    There were no vitals filed for this visit.   Subjective Assessment - 12/21/21 0931     Subjective Pt reports that her dizziness is "feeling a lot better" today    Pertinent History CAD, arthritis    Diagnostic tests MRI on 11/27/21 IMPRESSION:  No acute or reversible finding. Mild chronic small-vessel ischemic  changes of the pons and cerebellum. Moderate chronic small-vessel  ischemic changes  of the cerebral hemispheric white matter. Old  lacunar infarction left caudate body.  DaTScan on 12/07/21 was unremarkable.    Patient Stated Goals To not have double vision and walk without dizziness.    Currently in Pain? Yes    Pain Score 4     Pain Location Shoulder    Pain Orientation Right    Pain Descriptors / Indicators Aching;Sore    Pain Score 1    Pain Location Knee    Pain Orientation Left    Pain Descriptors / Indicators Dull                               [facility] Adult PT Treatment/Exercise - 12/21/21 0001       Ambulation/Gait   Gait Comments Ambulation outside down sidewalk in front of building without assistive device independently with slow gait velocity noted.      High Level Balance   High Level Balance Comments marching on trampoline without UE support 2x10 reps.      Lumbar Exercises: Aerobic   Nustep L5 x6' with PT present to discuss progress.             Vestibular Treatment/Exercise - 12/21/21 0001       Vestibular Treatment/Exercise   Habituation Exercises Standing Horizontal Head Turns;Standing Vertical Head Turns;Standing Diagonal Head Turns      Standing Horizontal Head Turns   Number of Reps  10    Symptom Description  standing on foam      Standing Vertical Head Turns   Number of Reps  10    Symptom Description  standing on foam      Standing Diagonal Head Turns   Number of Reps  10    Symptiom Description  each direction, standing on foam      Eye/Head Exercise Vertical   Comment Walking down hallway performing vertical and horizontal head turns x1 lap each.                      PT Short Term Goals - 12/14/21 1006       PT SHORT TERM GOAL #1   Title Pt will be independent with initial vestibular HEP.    Status Achieved               PT Long Term Goals - 12/14/21 1006       PT LONG TERM GOAL #1   Title Pt will be independent with advanced HEP.    Status Partially Met      PT LONG TERM  GOAL #2   Title Pt will complete TUG in 14 sec or less without  assistive device to demonstrate improved functional balance.    Status Achieved      PT LONG TERM GOAL #3   Title Pt to have a BERG of at least 50/56 to place her at a lower risk of falling.    Status Partially Met   49/56     PT LONG TERM GOAL #4   Title Pt will increase B hip strength to at least 4+/5 to allow her to perform transfers with increased ease.    Status Partially Met      PT LONG TERM GOAL #5   Title Pt will be able to track with left eye past midline when looking towards right side.    Status Achieved                   Plan - 12/21/21 1005     Clinical Impression Statement Ms Lingenfelter reports that she is feeling less dizzy and does not have constant dizziness any longer.  Pt reports that since initial evaluation, she is feeling 50% less dizzy overall.  Pt able to progress dynamic balance activities and perform marching on trampoline today without UE support.  Will reassess pt next visit to assess if pt is ready for discharge vs reassessment.    Personal Factors and Comorbidities Past/Current Experience;Fitness;Age;Comorbidity 1    PT Treatment/Interventions ADLs/Self Care Home Management;Gait training;Stair training;Functional mobility training;Therapeutic activities;Therapeutic exercise;Neuromuscular re-education;Balance training;Patient/family education;Manual techniques;Passive range of motion;Dry needling;Taping;Joint Manipulations;Cryotherapy;Electrical Stimulation;Iontophoresis 64m/ml Dexamethasone;Moist Heat;Canalith Repostioning;Vestibular    PT Next Visit Plan assess and progress HEP as indicated.  Vestibular, balance, strengthening.    PT Home Exercise Plan For Strengthening: Access Code [email or web address]  For Vestibular: Access Code: [email or web address]   Consulted and Agree with Plan of Care Patient             Patient will benefit from skilled therapeutic intervention in order to improve the following  deficits and impairments:  Difficulty walking, Dizziness, Decreased activity tolerance, Pain, Decreased balance, Decreased strength  Visit Diagnosis: Dizziness and giddiness  Unsteadiness on feet  Repeated falls  Muscle weakness (generalized)  Difficulty in walking, not elsewhere classified     Problem List Patient Active Problem List   Diagnosis Date Noted   Tremor of both hands 08/16/2021   [place]ortic atherosclerosis ([place] 06/15/2021   Generalized osteoarthritis of multiple sites 12/25/2020   Chronic pain disorder 12/25/2020   Pelvic mass in female    Bilateral lower extremity edema 04/13/2020   Shortness of breath    Anxiety disorder 10/29/2018   CKD (chronic kidney disease), stage III ([place] 06/27/2018   COPD (chronic obstructive pulmonary disease) ([place] 05/25/2018   Obesity 03/20/2018   Dyspnea on exertion 11/06/2017   Abnormal stress test 11/06/2017   Chronic diastolic CHF (congestive heart failure) ([place] 10/03/2017   Chronic combined systolic and diastolic heart failure ([place] 09/04/2017   Left ventricular dysfunction 07/31/2017   Cataract, nuclear sclerotic senile, bilateral 02/01/2017   Gouty arthritis of toe of left foot 08/09/2016   Hyperuricemia 08/09/2016   Osteoarthritis (arthritis due to wear and tear of joints) 01/14/2014   Coronary artery disease 08/02/2011   Mitral regurgitation 08/02/2011   OVERACTIVE BLADDER 07/02/2010   ACUTE CYSTITIS 05/25/2010   Pure hypercholesterolemia 08/11/2009   PEDAL EDEMA 07/22/2009   GERD 04/11/2008   Essential hypertension 05/30/2007   MYOCARDIAL INFARCTION, HX OF 05/30/2007   Allergic rhinitis 05/30/2007   LOW BACK PAIN 05/30/2007    SJuel Burrow PT 12/21/2021, 10:08 AM  [facility] @ [place], [place], [address] Phone: [phone]   Fax:  [phone]  Name: SHAKIYLA KOOK MRN: [id number] Date of Birth: [birth year]

## 2021-12-23 ENCOUNTER — Encounter: Payer: Self-pay | Admitting: Rehabilitative and Restorative Service Providers"

## 2021-12-23 ENCOUNTER — Other Ambulatory Visit: Payer: Self-pay

## 2021-12-23 ENCOUNTER — Ambulatory Visit: Payer: Medicare Other | Admitting: Rehabilitative and Restorative Service Providers"

## 2021-12-23 DIAGNOSIS — R262 Difficulty in walking, not elsewhere classified: Secondary | ICD-10-CM

## 2021-12-23 DIAGNOSIS — R42 Dizziness and giddiness: Secondary | ICD-10-CM

## 2021-12-23 DIAGNOSIS — M6281 Muscle weakness (generalized): Secondary | ICD-10-CM | POA: Diagnosis not present

## 2021-12-23 DIAGNOSIS — R296 Repeated falls: Secondary | ICD-10-CM

## 2021-12-23 DIAGNOSIS — R2681 Unsteadiness on feet: Secondary | ICD-10-CM

## 2021-12-23 NOTE — Therapy (Signed)
OUTPATIENT PHYSICAL THERAPY VESTIBULAR RE-EVALUATION     Patient Name: GEOVANNA SIMKO MRN: [id number] DOB:[birth year], 76 y.o., female Today's Date: 12/23/2021  PCP: [name], [name], MD REFERRING PROVIDER: [name], [name], MD   PT End of Session - 12/23/21 [phone]     Visit Number 13    Date for PT Re-Evaluation 02/18/22    Authorization Type Medicare    Progress Note Due on Visit 20    PT Start Time 0930    PT Stop Time 1010    PT Time Calculation (min) 40 min    Activity Tolerance Patient tolerated treatment well    Behavior During Therapy [facility] for tasks assessed/performed             Past Medical History:  Diagnosis Date   Allergic rhinitis    Allergy    Anxiety    Arthritis    Bronchitis    CAD (coronary artery disease)    1/19 PCI/DES to [place] for ISR, normal EF.    Cancer ([place])    hx of precancerous cells in right breast    Cataract    Cervical dysplasia    unsure of procedure, possible "burning" in her late 84s   CHF (congestive heart failure) ([place])    Echo 06/2019: EF 55-60, elevated LVEDP, normal RV SF, mild MAC, mild MR, trivial TR   Complication of anesthesia    COPD (chronic obstructive pulmonary disease) (HCC)    early    Depression    Dyspnea    with exertion    Family history of adverse reaction to anesthesia    daughter- problems wiht n/v   GERD (gastroesophageal reflux disease)    Gout    Headache(784.0)    Heart murmur    hx of years ago    Hyperlipidemia    Hypertension    Low back pain    Menopausal syndrome    Myocardial infarct ([place]) 2007   hx of   Overactive bladder    PONV (postoperative nausea and vomiting)    Sleep apnea    Past Surgical History:  Procedure Laterality Date   ANGIOPLASTY     stent 2007   BREAST LUMPECTOMY WITH RADIOACTIVE SEED LOCALIZATION Right 04/28/2020   Procedure: RIGHT BREAST LUMPECTOMY X 2  WITH RADIOACTIVE SEED LOCALIZATION;  Surgeon: Donnie Mesa, MD;  Location: [place];   Service: General;  Laterality: Right;  LMA   CARDIAC CATHETERIZATION     COLONOSCOPY     CORONARY STENT INTERVENTION N/A 11/06/2017   Procedure: CORONARY STENT INTERVENTION;  Surgeon: [name], MD;  Location: [place] [facility];  Service: Cardiovascular;  Laterality: N/A;   LEFT HEART CATH AND CORONARY ANGIOGRAPHY N/A 11/06/2017   Procedure: LEFT HEART CATH AND CORONARY ANGIOGRAPHY;  Surgeon: [name], MD;  Location: [place] [facility];  Service: Cardiovascular;  Laterality: N/A;   LEFT HEART CATH AND CORONARY ANGIOGRAPHY N/A 07/24/2019   Procedure: LEFT HEART CATH AND CORONARY ANGIOGRAPHY;  Surgeon: Burnell Blanks, MD;  Location: [place] [facility];  Service: Cardiovascular;  Laterality: N/A;   RIGHT/LEFT HEART CATH AND CORONARY ANGIOGRAPHY N/A 09/29/2020   Procedure: RIGHT/LEFT HEART CATH AND CORONARY ANGIOGRAPHY;  Surgeon: Belva Crome, MD;  Location: [place] [facility];  Service: Cardiovascular;  Laterality: N/A;   ROBOTIC ASSISTED BILATERAL SALPINGO OOPHERECTOMY Bilateral 11/10/2020   Procedure: XI ROBOTIC ASSISTED BILATERAL SALPINGO OOPHORECTOMY WITH MINI LAPAROTOMY FOR DRAINAGE;  Surgeon: Lafonda Mosses, MD;  [facility];  Service: Gynecology;  Laterality: Bilateral;  MINI LAP FIRST   TONSILLECTOMY     VULVECTOMY N/A 11/10/2020   Procedure: WIDE EXCISION VULVECTOMY;  Surgeon: Lafonda Mosses, MD;  [facility];  Service: Gynecology;  Laterality: N/A;   Patient Active Problem List   Diagnosis Date Noted   Tremor of both hands 08/16/2021   Aortic atherosclerosis ([place]) 06/15/2021   Generalized osteoarthritis of multiple sites 12/25/2020   Chronic pain disorder 12/25/2020   Pelvic mass in female    Bilateral lower extremity edema 04/13/2020   Shortness of breath    Anxiety disorder 10/29/2018   CKD (chronic kidney disease), stage III ([place]) 06/27/2018   COPD (chronic obstructive pulmonary disease) ([place]) 05/25/2018   Obesity 03/20/2018   Dyspnea  on exertion 11/06/2017   Abnormal stress test 11/06/2017   Chronic diastolic CHF (congestive heart failure) ([place]) 10/03/2017   Chronic combined systolic and diastolic heart failure ([place]) 09/04/2017   Left ventricular dysfunction 07/31/2017   Cataract, nuclear sclerotic senile, bilateral 02/01/2017   Gouty arthritis of toe of left foot 08/09/2016   Hyperuricemia 08/09/2016   Osteoarthritis (arthritis due to wear and tear of joints) 01/14/2014   Coronary artery disease 08/02/2011   Mitral regurgitation 08/02/2011   OVERACTIVE BLADDER 07/02/2010   ACUTE CYSTITIS 05/25/2010   Pure hypercholesterolemia 08/11/2009   PEDAL EDEMA 07/22/2009   GERD 04/11/2008   Essential hypertension 05/30/2007   MYOCARDIAL INFARCTION, HX OF 05/30/2007   Allergic rhinitis 05/30/2007   LOW BACK PAIN 05/30/2007    ONSET DATE: 10/23/2021  REFERRING DIAG: R42 (ICD-10-CM) - Dizziness  THERAPY DIAG:  Dizziness and giddiness - Plan: PT plan of care cert/re-cert  Unsteadiness on feet - Plan: PT plan of care cert/re-cert  Repeated falls - Plan: PT plan of care cert/re-cert  Muscle weakness (generalized) - Plan: PT plan of care cert/re-cert  Difficulty in walking, not elsewhere classified - Plan: PT plan of care cert/re-cert  SUBJECTIVE:   SUBJECTIVE STATEMENT: Pt reports that she is feeling a lot better, but still has dizziness. Pt accompanied by: self  PERTINENT HISTORY: CAD, arthritis   PAIN:  Are you having pain? Yes NPRS scale: 1-2/10 Pain location: L knee and R shoulder Pain orientation: Anterior  PAIN TYPE: burning and tingling Pain description: intermittent  Aggravating factors: unknown Relieving factors: use of heating stick on pad  PRECAUTIONS: Fall  WEIGHT BEARING RESTRICTIONS No  FALLS: Has patient fallen in last 6 months? No, Number of falls: 0  LIVING ENVIRONMENT: Lives with: lives with their spouse Lives in: House/apartment Stairs: Yes; Internal: 14 steps; on right going up  and External: 5 steps; none Has following equipment at home: Single point cane and Walker - 4 wheeled  PLOF: Independent  PATIENT GOALS: To not have double vision and walk without dizziness.  OBJECTIVE:   DIAGNOSTIC FINDINGS: MRI on 11/27/21 IMPRESSION:  No acute or reversible finding. Mild chronic small-vessel ischemic  changes of the pons and cerebellum. Moderate chronic small-vessel  ischemic changes of the cerebral hemispheric white matter. Old  lacunar infarction left caudate body.  DaTScan on 12/07/21 was unremarkable.  COGNITION: Overall cognitive status: Within functional limits for tasks assessed  STRENGTH:  12/23/2321:  approximately 4 to 4+/5 throughout bilat LE   FUNCTIONAL TESTs:  [place] Balance Scale: 50/56  on 12/23/2021  PATIENT SURVEYS:   11/11/2021: DHI Total Score: 60 / 100   Physical Score: 12 / 28   Emotional Score: 22 / 36   Functional Score: 26 / 36  12/23/2021: DHI Total Score: 42 / 100 Physical Score: 8 / 28 Emotional Score: 18 / 36 Functional Score: 16 / 36   VESTIBULAR ASSESSMENT   GENERAL OBSERVATION: Pt continues with increased fear of falling.    SYMPTOM BEHAVIOR:   Subjective history: Pt reports feeling dizzy about 25% of the day now   Non-Vestibular symptoms: headaches and tinnitus   Type of dizziness: Lightheadedness/Faint   Frequency: 25% of the day   Duration: reports a brief duration   Aggravating factors: Induced by motion: turning head quickly   Relieving factors: head stationary   Progression of symptoms: better   PATIENT EDUCATION: Education details: Reviewed HEP Person educated: Patient Education method: Explanation and Demonstration Education comprehension: verbalized understanding and returned demonstration  TREATMENT:  12/23/2021: Nustep L5 x6 minutes with PT present to discuss status Single leg stance without UE support  Ambulation down hallway with horizontal and vertical head turns with cuing for faster head turns and  increased pacing Static stance with fast horizontal and vertical head turns  ASSESSMENT:  CLINICAL IMPRESSION: Patient is a 76 y.o. female who was seen today for physical therapy re-evaluation and treatment for dizziness. Ms Tangeman has been making progress with goal related activities since initial evaluation, but has not yet met all goals.  Pt with decreased dizziness handicap inventory percentage as well as spending less of her time dizzy throughout the day.  Pt no longer has diplopia and has eye tracking WFL again and able to track past midline with both eyes.  Pt continues to require skilled PT for an additional 2x/week for 8 weeks to progress towards improved balance and decreased vestibular impairments.   OBJECTIVE IMPAIRMENTS decreased balance, difficulty walking, decreased strength, dizziness, and pain.   ACTIVITY LIMITATIONS cleaning, community activity, and driving.   PERSONAL FACTORS 3+ comorbidities: CAD, OA, HTN  are also affecting patient's functional outcome.    REHAB POTENTIAL: Good  CLINICAL DECISION MAKING: Evolving/moderate complexity  EVALUATION COMPLEXITY: Moderate   GOALS: Goals reviewed with patient? Yes  SHORT TERM GOALS:  Pt will be independent with initial vestibular HEP. Baseline:  Target date: 11/23/2021 Goal status: MET   LONG TERM GOALS:  Pt will be independent with advanced HEP. Baseline:  Target date: 02/17/2022 Goal status: IN PROGRESS  2.  Pt will complete TUG in 14 sec or less without assistive device to demonstrate improved functional balance. Baseline:  Target date: 01/06/2022 Goal status: MET  3.  Pt to have a BERG of at least 50/56 to place her at a lower risk of falling. Baseline:  Target date: 12/24/2021 Goal status: MET  4.  Pt will increase B hip strength to at least 4+/5 to allow her to perform transfers with increased ease. Baseline:  Target date: 02/17/2022 Goal status: IN PROGRESS  5.  Pt will be able to track with left  eye past midline when looking towards right side. Baseline:  Target date: 12/24/2021 Goal status: MET  6.  Pt able to maintain single leg stance for 5 seconds to decrease risk of falling. Baseline: 1 sec Target date: 02/17/2022 Goal status: INITIAL  PLAN: PT FREQUENCY: 2x/week  PT DURATION: 8 weeks  PLANNED INTERVENTIONS: Therapeutic exercises, Therapeutic activity, Neuromuscular re-education, Balance training, Gait training, Patient/Family education, Joint manipulation, Joint mobilization, Stair training, Vestibular training, Canalith repositioning, Aquatic Therapy, Dry Needling, Electrical stimulation, Spinal manipulation, Spinal mobilization, Cryotherapy, Moist heat, Taping, Vasopneumatic device, Ultrasound, Ionotophoresis 65m/ml Dexamethasone, and Manual therapy  PLAN FOR NEXT SESSION: vestibular, balance, strengthening  Calayah Guadarrama, PT 12/23/2021, 11:02 AM

## 2021-12-28 ENCOUNTER — Encounter: Payer: Self-pay | Admitting: Rehabilitative and Restorative Service Providers"

## 2021-12-28 ENCOUNTER — Other Ambulatory Visit: Payer: Self-pay

## 2021-12-28 ENCOUNTER — Ambulatory Visit: Payer: Medicare Other | Admitting: Rehabilitative and Restorative Service Providers"

## 2021-12-28 DIAGNOSIS — M6281 Muscle weakness (generalized): Secondary | ICD-10-CM | POA: Diagnosis not present

## 2021-12-28 DIAGNOSIS — R2681 Unsteadiness on feet: Secondary | ICD-10-CM

## 2021-12-28 DIAGNOSIS — R262 Difficulty in walking, not elsewhere classified: Secondary | ICD-10-CM | POA: Diagnosis not present

## 2021-12-28 DIAGNOSIS — R42 Dizziness and giddiness: Secondary | ICD-10-CM

## 2021-12-28 DIAGNOSIS — R296 Repeated falls: Secondary | ICD-10-CM

## 2021-12-28 NOTE — Therapy (Signed)
?OUTPATIENT PHYSICAL THERAPY TREATMENT NOTE ? ? ?Patient Name: Tracy [name] ?MRN: [id number] ?DOB:09-28-1946, 76 y.o., female ?Today's Date: 12/28/2021 ? ?PCP: [name], [name], MD ?REFERRING PROVIDER: [name], [name], MD ? ? PT End of Session - 12/28/21 0923   ? ? Visit Number 14   ? Date for PT Re-Evaluation 02/18/22   ? Authorization Type Medicare   ? Progress Note Due on Visit 20   ? PT Start Time 2725   ? PT Stop Time 1000   ? PT Time Calculation (min) 40 min   ? Activity Tolerance Patient tolerated treatment well   ? Behavior During Therapy [facility] for tasks assessed/performed   ? ?  ?  ? ?  ? ? ?Past Medical History:  ?Diagnosis Date  ? Allergic rhinitis   ? Allergy   ? Anxiety   ? Arthritis   ? Bronchitis   ? CAD (coronary artery disease)   ? 1/19 PCI/DES to [facility] for ISR, normal EF.   ? Cancer [facility])   ? hx of precancerous cells in right breast   ? Cataract   ? Cervical dysplasia   ? unsure of procedure, possible "burning" in her late 92s  ? CHF (congestive heart failure) ([place])   ? Echo 06/2019: EF 55-60, elevated LVEDP, normal RV SF, mild MAC, mild MR, trivial TR  ? Complication of anesthesia   ? COPD (chronic obstructive pulmonary disease) ([place])   ? early   ? Depression   ? Dyspnea   ? with exertion   ? Family history of adverse reaction to anesthesia   ? daughter- problems wiht n/v  ? GERD (gastroesophageal reflux disease)   ? Gout   ? Headache(784.0)   ? Heart murmur   ? hx of years ago   ? Hyperlipidemia   ? Hypertension   ? Low back pain   ? Menopausal syndrome   ? Myocardial infarct [facility]) 2007  ? hx of  ? Overactive bladder   ? PONV (postoperative nausea and vomiting)   ? Sleep apnea   ? ?Past Surgical History:  ?Procedure Laterality Date  ? ANGIOPLASTY    ? stent 2007  ? BREAST LUMPECTOMY WITH RADIOACTIVE SEED LOCALIZATION Right 04/28/2020  ? Procedure: RIGHT BREAST LUMPECTOMY X 2  WITH RADIOACTIVE SEED LOCALIZATION;  Surgeon: Donnie Mesa, MD;  Location: [place];  Service: General;   Laterality: Right;  LMA  ? CARDIAC CATHETERIZATION    ? COLONOSCOPY    ? CORONARY STENT INTERVENTION N/A 11/06/2017  ? Procedure: CORONARY STENT INTERVENTION;  Surgeon: [name], MD;  Location: [place] [facility];  Service: Cardiovascular;  Laterality: N/A;  ? LEFT HEART CATH AND CORONARY ANGIOGRAPHY N/A 11/06/2017  ? Procedure: LEFT HEART CATH AND CORONARY ANGIOGRAPHY;  Surgeon: [name], MD;  Location: [place] [facility];  Service: Cardiovascular;  Laterality: N/A;  ? LEFT HEART CATH AND CORONARY ANGIOGRAPHY N/A 07/24/2019  ? Procedure: LEFT HEART CATH AND CORONARY ANGIOGRAPHY;  Surgeon: Burnell Blanks, MD;  Location: [place] [facility];  Service: Cardiovascular;  Laterality: N/A;  ? RIGHT/LEFT HEART CATH AND CORONARY ANGIOGRAPHY N/A 09/29/2020  ? Procedure: RIGHT/LEFT HEART CATH AND CORONARY ANGIOGRAPHY;  Surgeon: Belva Crome, MD;  Location: [place] [facility];  Service: Cardiovascular;  Laterality: N/A;  ? ROBOTIC ASSISTED BILATERAL SALPINGO OOPHERECTOMY Bilateral 11/10/2020  ? Procedure: XI ROBOTIC ASSISTED BILATERAL SALPINGO OOPHORECTOMY WITH MINI LAPAROTOMY FOR DRAINAGE;  Surgeon: Lafonda Mosses, MD;  [facility];  Service:  Gynecology;  Laterality: Bilateral;  MINI LAP FIRST  ? TONSILLECTOMY    ? VULVECTOMY N/A 11/10/2020  ? Procedure: WIDE EXCISION VULVECTOMY;  Surgeon: Lafonda Mosses, MD;  Location: [facility];  Service: Gynecology;  Laterality: N/A;  ? ?Patient Active Problem List  ? Diagnosis Date Noted  ? Tremor of both hands 08/16/2021  ? Aortic atherosclerosis ([place]) 06/15/2021  ? Generalized osteoarthritis of multiple sites 12/25/2020  ? Chronic pain disorder 12/25/2020  ? Pelvic mass in female   ? Bilateral lower extremity edema 04/13/2020  ? Shortness of breath   ? Anxiety disorder 10/29/2018  ? CKD (chronic kidney disease), stage III ([place]) 06/27/2018  ? COPD (chronic obstructive pulmonary disease) ([place]) 05/25/2018  ? Obesity 03/20/2018  ? Dyspnea on exertion  11/06/2017  ? Abnormal stress test 11/06/2017  ? Chronic diastolic CHF (congestive heart failure) ([place]) 10/03/2017  ? Chronic combined systolic and diastolic heart failure ([place]) 09/04/2017  ? Left ventricular dysfunction 07/31/2017  ? Cataract, nuclear sclerotic senile, bilateral 02/01/2017  ? Gouty arthritis of toe of left foot 08/09/2016  ? Hyperuricemia 08/09/2016  ? Osteoarthritis (arthritis due to wear and tear of joints) 01/14/2014  ? Coronary artery disease 08/02/2011  ? Mitral regurgitation 08/02/2011  ? OVERACTIVE BLADDER 07/02/2010  ? ACUTE CYSTITIS 05/25/2010  ? Pure hypercholesterolemia 08/11/2009  ? PEDAL EDEMA 07/22/2009  ? GERD 04/11/2008  ? Essential hypertension 05/30/2007  ? MYOCARDIAL INFARCTION, HX OF 05/30/2007  ? Allergic rhinitis 05/30/2007  ? LOW BACK PAIN 05/30/2007  ? ? ?REFERRING DIAG: R42 (ICD-10-CM) - Dizziness ? ?THERAPY DIAG:  ?Dizziness and giddiness ? ?Unsteadiness on feet ? ?Repeated falls ? ?Muscle weakness (generalized) ? ?Difficulty in walking, not elsewhere classified ? ?PERTINENT HISTORY: CAD, arthritis ? ?PRECAUTIONS: Fall ? ?SUBJECTIVE: Pt reports that she had increased dizziness this morning for approx 2-5 min when she was getting in her car.  She states that it felt like a wave of dizziness. ? ?PAIN:  ?Are you having pain? Yes: NPRS scale: 2/10 ?Pain location: bilat knees ?Pain description: just a little ache ?Aggravating factors: unknown ?Relieving factors: adhesive heat pads ? ?PATIENT GOALS: To not have double vision and walk without dizziness. ? ?OBJECTIVE:  ?  ?DIAGNOSTIC FINDINGS: MRI on 11/27/21 IMPRESSION:  No acute or reversible finding. Mild chronic small-vessel ischemic  changes of the pons and cerebellum. Moderate chronic small-vessel  ischemic changes of the cerebral hemispheric white matter. Old  lacunar infarction left caudate body.  DaTScan on 12/07/21 was unremarkable. ?  ?COGNITION: ?Overall cognitive status: Within functional limits for tasks assessed ?   ?STRENGTH:  ?12/23/2321:  approximately 4 to 4+/5 throughout bilat LE ?  ?  ?FUNCTIONAL TESTs:  ?Berg Balance Scale: 50/56  on 12/23/2021 ?  ?PATIENT SURVEYS:  ?  ?11/11/2021: ?DHI Total Score: 60 / 100   ?Physical Score: 12 / 28   ?Emotional Score: 22 / 36   ?Functional Score: 26 / 36 ?  ?12/23/2021: ?DHI Total Score: 42 / 100 ?Physical Score: 8 / 28 ?Emotional Score: 18 / 36 ?Functional Score: 16 / 36 ?  ?  ?VESTIBULAR ASSESSMENT ?  ?           GENERAL OBSERVATION: Pt continues with increased fear of falling. ?            ?           SYMPTOM BEHAVIOR: ?  Subjective history: Pt reports feeling dizzy about 25% of the day now ?                      Non-Vestibular symptoms: headaches and tinnitus ?                      Type of dizziness: Lightheadedness/Faint ?                      Frequency: 25% of the day ?                      Duration: reports a brief duration ?                      Aggravating factors: Induced by motion: turning head quickly ?                      Relieving factors: head stationary ?                      Progression of symptoms: better ?  ?  ?PATIENT EDUCATION: ?Education details: Reviewed HEP ?Person educated: Patient ?Education method: Explanation and Demonstration ?Education comprehension: verbalized understanding and returned demonstration ?  ?TREATMENT: ?  ?12/28/2021: ?Nustep L5 x6 minutes with PT present to discuss status ?Tandem gait on AirEx beam x4 laps in parallel bars for balance ?Side stepping on AirEx beam x4 laps each direction in parallel bars for balance ?Alt LE toe tap to 2 cones 2x10 reps in parallel bars for balance ?VOR 1x1 standing on blue foam in parallel bars x1 min each ?Single leg stance without UE support  ? ?12/23/2021: ?Nustep L5 x6 minutes with PT present to discuss status ?Single leg stance without UE support  ?Ambulation down hallway with horizontal and vertical head turns with cuing for faster head turns and increased pacing ?Static stance with fast  horizontal and vertical head turns ?  ?ASSESSMENT: ?  ?CLINICAL IMPRESSION: ?Jearld Fenton continues to progress towards goal related activities.  Able to focus today on higher level balance tasks to improve patient's

## 2021-12-30 ENCOUNTER — Ambulatory Visit: Payer: Medicare Other | Admitting: Rehabilitative and Restorative Service Providers"

## 2021-12-30 ENCOUNTER — Other Ambulatory Visit: Payer: Self-pay

## 2021-12-30 ENCOUNTER — Encounter: Payer: Self-pay | Admitting: Rehabilitative and Restorative Service Providers"

## 2021-12-30 DIAGNOSIS — R262 Difficulty in walking, not elsewhere classified: Secondary | ICD-10-CM | POA: Diagnosis not present

## 2021-12-30 DIAGNOSIS — R42 Dizziness and giddiness: Secondary | ICD-10-CM | POA: Diagnosis not present

## 2021-12-30 DIAGNOSIS — M6281 Muscle weakness (generalized): Secondary | ICD-10-CM

## 2021-12-30 DIAGNOSIS — R2681 Unsteadiness on feet: Secondary | ICD-10-CM

## 2021-12-30 DIAGNOSIS — Z961 Presence of intraocular lens: Secondary | ICD-10-CM | POA: Diagnosis not present

## 2021-12-30 DIAGNOSIS — H02836 Dermatochalasis of left eye, unspecified eyelid: Secondary | ICD-10-CM | POA: Diagnosis not present

## 2021-12-30 DIAGNOSIS — R296 Repeated falls: Secondary | ICD-10-CM | POA: Diagnosis not present

## 2021-12-30 DIAGNOSIS — H532 Diplopia: Secondary | ICD-10-CM | POA: Diagnosis not present

## 2021-12-30 DIAGNOSIS — H02833 Dermatochalasis of right eye, unspecified eyelid: Secondary | ICD-10-CM | POA: Diagnosis not present

## 2021-12-30 NOTE — Therapy (Signed)
?OUTPATIENT PHYSICAL THERAPY TREATMENT NOTE ? ? ?Patient Name: Tracy [name] ?MRN: [id number] ?DOB:1946-04-21, 76 y.o., female ?Today's Date: 12/30/2021 ? ?PCP: [name], [name], MD ?REFERRING PROVIDER: [name], [name], MD ? ? PT End of Session - 12/30/21 0909   ? ? Visit Number 15   ? Date for PT Re-Evaluation 02/18/22   ? Authorization Type Medicare   ? Progress Note Due on Visit 20   ? PT Start Time [phone]   ? PT Stop Time 6387   ? PT Time Calculation (min) 40 min   ? Activity Tolerance Patient tolerated treatment well   ? Behavior During Therapy [facility] for tasks assessed/performed   ? ?  ?  ? ?  ? ? ?Past Medical History:  ?Diagnosis Date  ? Allergic rhinitis   ? Allergy   ? Anxiety   ? Arthritis   ? Bronchitis   ? CAD (coronary artery disease)   ? 1/19 PCI/DES to [facility] for ISR, normal EF.   ? Cancer [facility])   ? hx of precancerous cells in right breast   ? Cataract   ? Cervical dysplasia   ? unsure of procedure, possible "burning" in her late 67s  ? CHF (congestive heart failure) ([place])   ? Echo 06/2019: EF 55-60, elevated LVEDP, normal RV SF, mild MAC, mild MR, trivial TR  ? Complication of anesthesia   ? COPD (chronic obstructive pulmonary disease) ([place])   ? early   ? Depression   ? Dyspnea   ? with exertion   ? Family history of adverse reaction to anesthesia   ? daughter- problems wiht n/v  ? GERD (gastroesophageal reflux disease)   ? Gout   ? Headache(784.0)   ? Heart murmur   ? hx of years ago   ? Hyperlipidemia   ? Hypertension   ? Low back pain   ? Menopausal syndrome   ? Myocardial infarct [facility]) 2007  ? hx of  ? Overactive bladder   ? PONV (postoperative nausea and vomiting)   ? Sleep apnea   ? ?Past Surgical History:  ?Procedure Laterality Date  ? ANGIOPLASTY    ? stent 2007  ? BREAST LUMPECTOMY WITH RADIOACTIVE SEED LOCALIZATION Right 04/28/2020  ? Procedure: RIGHT BREAST LUMPECTOMY X 2  WITH RADIOACTIVE SEED LOCALIZATION;  Surgeon: Donnie Mesa, MD;  Location: [place];  Service: General;   Laterality: Right;  LMA  ? CARDIAC CATHETERIZATION    ? COLONOSCOPY    ? CORONARY STENT INTERVENTION N/A 11/06/2017  ? Procedure: CORONARY STENT INTERVENTION;  Surgeon: [name], MD;  Location: [place] [facility];  Service: Cardiovascular;  Laterality: N/A;  ? LEFT HEART CATH AND CORONARY ANGIOGRAPHY N/A 11/06/2017  ? Procedure: LEFT HEART CATH AND CORONARY ANGIOGRAPHY;  Surgeon: [name], MD;  Location: [place] [facility];  Service: Cardiovascular;  Laterality: N/A;  ? LEFT HEART CATH AND CORONARY ANGIOGRAPHY N/A 07/24/2019  ? Procedure: LEFT HEART CATH AND CORONARY ANGIOGRAPHY;  Surgeon: Burnell Blanks, MD;  Location: [place] [facility];  Service: Cardiovascular;  Laterality: N/A;  ? RIGHT/LEFT HEART CATH AND CORONARY ANGIOGRAPHY N/A 09/29/2020  ? Procedure: RIGHT/LEFT HEART CATH AND CORONARY ANGIOGRAPHY;  Surgeon: Belva Crome, MD;  Location: [place] [facility];  Service: Cardiovascular;  Laterality: N/A;  ? ROBOTIC ASSISTED BILATERAL SALPINGO OOPHERECTOMY Bilateral 11/10/2020  ? Procedure: XI ROBOTIC ASSISTED BILATERAL SALPINGO OOPHORECTOMY WITH MINI LAPAROTOMY FOR DRAINAGE;  Surgeon: Lafonda Mosses, MD;  [facility];  Service:  Gynecology;  Laterality: Bilateral;  MINI LAP FIRST  ? TONSILLECTOMY    ? VULVECTOMY N/A 11/10/2020  ? Procedure: WIDE EXCISION VULVECTOMY;  Surgeon: Lafonda Mosses, MD;  [facility];  Service: Gynecology;  Laterality: N/A;  ? ?Patient Active Problem List  ? Diagnosis Date Noted  ? Tremor of both hands 08/16/2021  ? Aortic atherosclerosis ([place]) 06/15/2021  ? Generalized osteoarthritis of multiple sites 12/25/2020  ? Chronic pain disorder 12/25/2020  ? Pelvic mass in female   ? Bilateral lower extremity edema 04/13/2020  ? Shortness of breath   ? Anxiety disorder 10/29/2018  ? CKD (chronic kidney disease), stage III ([place]) 06/27/2018  ? COPD (chronic obstructive pulmonary disease) ([place]) 05/25/2018  ? Obesity 03/20/2018  ? Dyspnea on exertion  11/06/2017  ? Abnormal stress test 11/06/2017  ? Chronic diastolic CHF (congestive heart failure) ([place]) 10/03/2017  ? Chronic combined systolic and diastolic heart failure ([place]) 09/04/2017  ? Left ventricular dysfunction 07/31/2017  ? Cataract, nuclear sclerotic senile, bilateral 02/01/2017  ? Gouty arthritis of toe of left foot 08/09/2016  ? Hyperuricemia 08/09/2016  ? Osteoarthritis (arthritis due to wear and tear of joints) 01/14/2014  ? Coronary artery disease 08/02/2011  ? Mitral regurgitation 08/02/2011  ? OVERACTIVE BLADDER 07/02/2010  ? ACUTE CYSTITIS 05/25/2010  ? Pure hypercholesterolemia 08/11/2009  ? PEDAL EDEMA 07/22/2009  ? GERD 04/11/2008  ? Essential hypertension 05/30/2007  ? MYOCARDIAL INFARCTION, HX OF 05/30/2007  ? Allergic rhinitis 05/30/2007  ? LOW BACK PAIN 05/30/2007  ? ? ?REFERRING DIAG: R42 (ICD-10-CM) - Dizziness ? ?THERAPY DIAG:  ?Dizziness and giddiness ? ?Unsteadiness on feet ? ?Repeated falls ? ?Muscle weakness (generalized) ? ?Difficulty in walking, not elsewhere classified ? ?PERTINENT HISTORY: CAD, arthritis ? ?PRECAUTIONS: Fall ? ?SUBJECTIVE: Pt denies current dizziness.  States that she is having some overall pain secondary to cold weather. ? ?PAIN:  ?Are you having pain? Yes: NPRS scale: 3/10 ?Pain location: left knees ?Pain description: just a little ache ?Aggravating factors: unknown ?Relieving factors: adhesive heat pads ? ?PATIENT GOALS: To not have double vision and walk without dizziness. ? ?OBJECTIVE:  ?  ?DIAGNOSTIC FINDINGS: MRI on 11/27/21 IMPRESSION:  No acute or reversible finding. Mild chronic small-vessel ischemic  changes of the pons and cerebellum. Moderate chronic small-vessel  ischemic changes of the cerebral hemispheric white matter. Old  lacunar infarction left caudate body.  DaTScan on 12/07/21 was unremarkable. ?  ?COGNITION: ?Overall cognitive status: Within functional limits for tasks assessed ?  ?STRENGTH:  ?12/23/2321:  approximately 4 to 4+/5 throughout  bilat LE ?  ?  ?FUNCTIONAL TESTs:  ?Berg Balance Scale: 50/56  on 12/23/2021 ?  ?PATIENT SURVEYS:  ?  ?11/11/2021: ?DHI Total Score: 60 / 100   ?Physical Score: 12 / 28   ?Emotional Score: 22 / 36   ?Functional Score: 26 / 36 ?  ?12/23/2021: ?DHI Total Score: 42 / 100 ?Physical Score: 8 / 28 ?Emotional Score: 18 / 36 ?Functional Score: 16 / 36 ?  ?  ?VESTIBULAR ASSESSMENT ?  ?           GENERAL OBSERVATION: Pt continues with increased fear of falling. ?            ?           SYMPTOM BEHAVIOR: ?                      Subjective history: Pt reports feeling dizzy about 25% of the day now ?  Non-Vestibular symptoms: headaches and tinnitus ?                      Type of dizziness: Lightheadedness/Faint ?                      Frequency: 25% of the day ?                      Duration: reports a brief duration ?                      Aggravating factors: Induced by motion: turning head quickly ?                      Relieving factors: head stationary ?                      Progression of symptoms: better ?  ?  ?PATIENT EDUCATION: ?Education details: Reviewed HEP ?Person educated: Patient ?Education method: Explanation and Demonstration ?Education comprehension: verbalized understanding and returned demonstration ?  ?TREATMENT: ?  ?12/30/2021: ?Nustep L5 x6 minutes with PT present to discuss status ?Alt LE toe tap to 2 cones 2x10 reps with CGA ?Tandem gait on AirEx beam x4 laps in parallel bars for balance ?Side stepping on AirEx beam x4 laps each direction in parallel bars for balance ?VOR 1x1 standing on blue foam in parallel bars x1 min horizontal, x1 min vertical ?Standing on blue foam with eyes closed 2 x 30 sec ?Ambulation down hallway performing head turns:  x2 vertical, x2 horizontal ?Retrogait down hallway x1 lap ? ?12/28/2021: ?Nustep L5 x6 minutes with PT present to discuss status ?Tandem gait on AirEx beam x4 laps in parallel bars for balance ?Side stepping on AirEx beam x4 laps each direction in  parallel bars for balance ?Alt LE toe tap to 2 cones 2x10 reps in parallel bars for balance ?VOR 1x1 standing on blue foam in parallel bars x1 min each ?Single leg stance without UE support  ? ?12/23/2021: ?Nustep

## 2021-12-31 ENCOUNTER — Other Ambulatory Visit: Payer: Self-pay | Admitting: Cardiovascular Disease

## 2022-01-04 ENCOUNTER — Encounter: Payer: Self-pay | Admitting: Rehabilitative and Restorative Service Providers"

## 2022-01-04 ENCOUNTER — Ambulatory Visit: Payer: Medicare Other | Admitting: Rehabilitative and Restorative Service Providers"

## 2022-01-04 ENCOUNTER — Other Ambulatory Visit: Payer: Self-pay

## 2022-01-04 DIAGNOSIS — R2681 Unsteadiness on feet: Secondary | ICD-10-CM | POA: Diagnosis not present

## 2022-01-04 DIAGNOSIS — R296 Repeated falls: Secondary | ICD-10-CM | POA: Diagnosis not present

## 2022-01-04 DIAGNOSIS — R262 Difficulty in walking, not elsewhere classified: Secondary | ICD-10-CM | POA: Diagnosis not present

## 2022-01-04 DIAGNOSIS — R42 Dizziness and giddiness: Secondary | ICD-10-CM

## 2022-01-04 DIAGNOSIS — M6281 Muscle weakness (generalized): Secondary | ICD-10-CM

## 2022-01-04 NOTE — Therapy (Signed)
?OUTPATIENT PHYSICAL THERAPY TREATMENT NOTE ? ? ?Patient Name: Tracy [name] ?MRN: [id number] ?DOB:[birth year], 76 y.o., female ?Today's Date: 01/04/2022 ? ?PCP: [name], [name], MD ?REFERRING PROVIDER: [name], [name], MD ? ? PT End of Session - 01/04/22 0935   ? ? Visit Number 16   ? Date for PT Re-Evaluation 02/18/22   ? Authorization Type Medicare   ? Progress Note Due on Visit 20   ? PT Start Time 0930   ? PT Stop Time 1010   ? PT Time Calculation (min) 40 min   ? Activity Tolerance Patient tolerated treatment well   ? Behavior During Therapy [facility] for tasks assessed/performed   ? ?  ?  ? ?  ? ? ?Past Medical History:  ?Diagnosis Date  ? Allergic rhinitis   ? Allergy   ? Anxiety   ? Arthritis   ? Bronchitis   ? CAD (coronary artery disease)   ? 1/19 PCI/DES to [place] for ISR, normal EF.   ? Cancer [facility])   ? hx of precancerous cells in right breast   ? Cataract   ? Cervical dysplasia   ? unsure of procedure, possible "burning" in her late 63s  ? CHF (congestive heart failure) ([place])   ? Echo 06/2019: EF 55-60, elevated LVEDP, normal RV SF, mild MAC, mild MR, trivial TR  ? Complication of anesthesia   ? COPD (chronic obstructive pulmonary disease) ([place])   ? early   ? Depression   ? Dyspnea   ? with exertion   ? Family history of adverse reaction to anesthesia   ? daughter- problems wiht n/v  ? GERD (gastroesophageal reflux disease)   ? Gout   ? Headache(784.0)   ? Heart murmur   ? hx of years ago   ? Hyperlipidemia   ? Hypertension   ? Low back pain   ? Menopausal syndrome   ? Myocardial infarct [facility]) 2007  ? hx of  ? Overactive bladder   ? PONV (postoperative nausea and vomiting)   ? Sleep apnea   ? ?Past Surgical History:  ?Procedure Laterality Date  ? ANGIOPLASTY    ? stent 2007  ? BREAST LUMPECTOMY WITH RADIOACTIVE SEED LOCALIZATION Right 04/28/2020  ? Procedure: RIGHT BREAST LUMPECTOMY X 2  WITH RADIOACTIVE SEED LOCALIZATION;  Surgeon: Donnie Mesa, MD;  Location: [place];  Service: General;   Laterality: Right;  LMA  ? CARDIAC CATHETERIZATION    ? COLONOSCOPY    ? CORONARY STENT INTERVENTION N/A 11/06/2017  ? Procedure: CORONARY STENT INTERVENTION;  Surgeon: [name], MD;  Location: [place] CV [facility];  Service: Cardiovascular;  Laterality: N/A;  ? LEFT HEART CATH AND CORONARY ANGIOGRAPHY N/A 11/06/2017  ? Procedure: LEFT HEART CATH AND CORONARY ANGIOGRAPHY;  Surgeon: [name], MD;  Location: [place] CV [facility];  Service: Cardiovascular;  Laterality: N/A;  ? LEFT HEART CATH AND CORONARY ANGIOGRAPHY N/A 07/24/2019  ? Procedure: LEFT HEART CATH AND CORONARY ANGIOGRAPHY;  Surgeon: Burnell Blanks, MD;  Location: [place] CV LAB;  Service: Cardiovascular;  Laterality: N/A;  ? RIGHT/LEFT HEART CATH AND CORONARY ANGIOGRAPHY N/A 09/29/2020  ? Procedure: RIGHT/LEFT HEART CATH AND CORONARY ANGIOGRAPHY;  Surgeon: Belva Crome, MD;  Location: [place] CV LAB;  Service: Cardiovascular;  Laterality: N/A;  ? ROBOTIC ASSISTED BILATERAL SALPINGO OOPHERECTOMY Bilateral 11/10/2020  ? Procedure: XI ROBOTIC ASSISTED BILATERAL SALPINGO OOPHORECTOMY WITH MINI LAPAROTOMY FOR DRAINAGE;  Surgeon: Lafonda Mosses, MD;  [facility];  Service:  Gynecology;  Laterality: Bilateral;  MINI LAP FIRST  ? TONSILLECTOMY    ? VULVECTOMY N/A 11/10/2020  ? Procedure: WIDE EXCISION VULVECTOMY;  Surgeon: Lafonda Mosses, MD;  Location: [facility];  Service: Gynecology;  Laterality: N/A;  ? ?Patient Active Problem List  ? Diagnosis Date Noted  ? Tremor of both hands 08/16/2021  ? Aortic atherosclerosis ([place]) 06/15/2021  ? Generalized osteoarthritis of multiple sites 12/25/2020  ? Chronic pain disorder 12/25/2020  ? Pelvic mass in female   ? Bilateral lower extremity edema 04/13/2020  ? Shortness of breath   ? Anxiety disorder 10/29/2018  ? CKD (chronic kidney disease), stage III ([place]) 06/27/2018  ? COPD (chronic obstructive pulmonary disease) ([place]) 05/25/2018  ? Obesity 03/20/2018  ? Dyspnea on exertion  11/06/2017  ? Abnormal stress test 11/06/2017  ? Chronic diastolic CHF (congestive heart failure) ([place]) 10/03/2017  ? Chronic combined systolic and diastolic heart failure ([place]) 09/04/2017  ? Left ventricular dysfunction 07/31/2017  ? Cataract, nuclear sclerotic senile, bilateral 02/01/2017  ? Gouty arthritis of toe of left foot 08/09/2016  ? Hyperuricemia 08/09/2016  ? Osteoarthritis (arthritis due to wear and tear of joints) 01/14/2014  ? Coronary artery disease 08/02/2011  ? Mitral regurgitation 08/02/2011  ? OVERACTIVE BLADDER 07/02/2010  ? ACUTE CYSTITIS 05/25/2010  ? Pure hypercholesterolemia 08/11/2009  ? PEDAL EDEMA 07/22/2009  ? GERD 04/11/2008  ? Essential hypertension 05/30/2007  ? MYOCARDIAL INFARCTION, HX OF 05/30/2007  ? Allergic rhinitis 05/30/2007  ? LOW BACK PAIN 05/30/2007  ? ? ?REFERRING DIAG: R42 (ICD-10-CM) - Dizziness ? ?THERAPY DIAG:  ?Dizziness and giddiness ? ?Unsteadiness on feet ? ?Repeated falls ? ?Muscle weakness (generalized) ? ?Difficulty in walking, not elsewhere classified ? ?PERTINENT HISTORY: CAD, arthritis ? ?PRECAUTIONS: Fall ? ?SUBJECTIVE: Pt continues to deny dizziness, states that she has a heating pad on her L knee and some R shoulder pain, but overall is feeling better. ? ?PAIN:  ?Are you having pain? Yes: NPRS scale: 3/10 ?Pain location: left knee ?Pain description: just a little ache ?Aggravating factors: unknown ?Relieving factors: adhesive heat pads ? ?PATIENT GOALS: To not have double vision and walk without dizziness. ? ?OBJECTIVE:  ?  ?DIAGNOSTIC FINDINGS: MRI on 11/27/21 IMPRESSION:  No acute or reversible finding. Mild chronic small-vessel ischemic  changes of the pons and cerebellum. Moderate chronic small-vessel  ischemic changes of the cerebral hemispheric white matter. Old  lacunar infarction left caudate body.  DaTScan on 12/07/21 was unremarkable. ?  ?COGNITION: ?Overall cognitive status: Within functional limits for tasks assessed ?  ?STRENGTH:  ?12/23/2321:   approximately 4 to 4+/5 throughout bilat LE ?  ?  ?FUNCTIONAL TESTs:  ?Berg Balance Scale: 50/56  on 12/23/2021 ?  ?PATIENT SURVEYS:  ?  ?11/11/2021: ?DHI Total Score: 60 / 100   ?Physical Score: 12 / 28   ?Emotional Score: 22 / 36   ?Functional Score: 26 / 36 ?  ?12/23/2021: ?DHI Total Score: 42 / 100 ?Physical Score: 8 / 28 ?Emotional Score: 18 / 36 ?Functional Score: 16 / 36 ?  ?  ?VESTIBULAR ASSESSMENT ?  ?           GENERAL OBSERVATION: Pt continues with increased fear of falling. ?            ?           SYMPTOM BEHAVIOR: ?                      Subjective history: Pt reports  feeling dizzy about 25% of the day now ?                      Non-Vestibular symptoms: headaches and tinnitus ?                      Type of dizziness: Lightheadedness/Faint ?                      Frequency: 25% of the day ?                      Duration: reports a brief duration ?                      Aggravating factors: Induced by motion: turning head quickly ?                      Relieving factors: head stationary ?                      Progression of symptoms: better ?  ?  ?PATIENT EDUCATION: ?Education details: Reviewed HEP ?Person educated: Patient ?Education method: Explanation and Demonstration ?Education comprehension: verbalized understanding and returned demonstration ?  ?TREATMENT: ?  ?01/04/2022: ?Nustep L5 x6 minutes with PT present to discuss status ?Alt LE toe tap to 2 cones 2x10 reps with CGA ?Tandem gait on AirEx beam x4 laps in parallel bars for balance ?Side stepping on AirEx beam x4 laps each direction in parallel bars for balance ?VOR 1x1 standing on blue foam in parallel bars x1 min horizontal, x1 min vertical ?Ambulation down hallway performing head turns:  x2 vertical, x2 horizontal ?Single leg stance trying to decrease use of UE for support ?Palloff Press standing on foam with yellow tband x10 each way ? ?12/30/2021: ?Nustep L5 x6 minutes with PT present to discuss status ?Alt LE toe tap to 2 cones 2x10 reps with  CGA ?Tandem gait on AirEx beam x4 laps in parallel bars for balance ?Side stepping on AirEx beam x4 laps each direction in parallel bars for balance ?VOR 1x1 standing on blue foam in parallel bars x1 min horizonta

## 2022-01-06 ENCOUNTER — Other Ambulatory Visit: Payer: Self-pay

## 2022-01-06 ENCOUNTER — Ambulatory Visit: Payer: Medicare Other | Admitting: Rehabilitative and Restorative Service Providers"

## 2022-01-06 ENCOUNTER — Encounter: Payer: Self-pay | Admitting: Rehabilitative and Restorative Service Providers"

## 2022-01-06 DIAGNOSIS — R42 Dizziness and giddiness: Secondary | ICD-10-CM | POA: Diagnosis not present

## 2022-01-06 DIAGNOSIS — R262 Difficulty in walking, not elsewhere classified: Secondary | ICD-10-CM

## 2022-01-06 DIAGNOSIS — R296 Repeated falls: Secondary | ICD-10-CM

## 2022-01-06 DIAGNOSIS — R2681 Unsteadiness on feet: Secondary | ICD-10-CM

## 2022-01-06 DIAGNOSIS — M6281 Muscle weakness (generalized): Secondary | ICD-10-CM

## 2022-01-06 NOTE — Therapy (Signed)
?OUTPATIENT PHYSICAL THERAPY TREATMENT NOTE AND DISCHARGE SUMMARY ? ? ?Patient Name: Tracy [name] ?MRN: [id number] ?DOB:01-26-1946, 76 y.o., female ?Today's Date: 01/06/2022 ? ?PCP: [name], [name], MD ?REFERRING PROVIDER: [name], [name], MD ? ? PT End of Session - 01/06/22 4166   ? ? Visit Number 17   ? Date for PT Re-Evaluation 02/18/22   ? Authorization Type Medicare   ? Progress Note Due on Visit 20   ? PT Start Time 0930   ? PT Stop Time 1010   ? PT Time Calculation (min) 40 min   ? Activity Tolerance Patient tolerated treatment well   ? Behavior During Therapy [facility] for tasks assessed/performed   ? ?  ?  ? ?  ? ? ?Past Medical History:  ?Diagnosis Date  ? Allergic rhinitis   ? Allergy   ? Anxiety   ? Arthritis   ? Bronchitis   ? CAD (coronary artery disease)   ? 1/19 PCI/DES to [place] for ISR, normal EF.   ? Cancer [facility])   ? hx of precancerous cells in right breast   ? Cataract   ? Cervical dysplasia   ? unsure of procedure, possible "burning" in her late 35s  ? CHF (congestive heart failure) ([place])   ? Echo 06/2019: EF 55-60, elevated LVEDP, normal RV SF, mild MAC, mild MR, trivial TR  ? Complication of anesthesia   ? COPD (chronic obstructive pulmonary disease) ([place])   ? early   ? Depression   ? Dyspnea   ? with exertion   ? Family history of adverse reaction to anesthesia   ? daughter- problems wiht n/v  ? GERD (gastroesophageal reflux disease)   ? Gout   ? Headache(784.0)   ? Heart murmur   ? hx of years ago   ? Hyperlipidemia   ? Hypertension   ? Low back pain   ? Menopausal syndrome   ? Myocardial infarct [facility] [place] [facility]) 2007  ? hx of  ? Overactive bladder   ? PONV (postoperative nausea and vomiting)   ? Sleep apnea   ? ?Past Surgical History:  ?Procedure Laterality Date  ? ANGIOPLASTY    ? stent 2007  ? BREAST LUMPECTOMY WITH RADIOACTIVE SEED LOCALIZATION Right 04/28/2020  ? Procedure: RIGHT BREAST LUMPECTOMY X 2  WITH RADIOACTIVE SEED LOCALIZATION;  Surgeon: Donnie Mesa, MD;  Location: [place];  Service: General;  Laterality: Right;  LMA  ? CARDIAC CATHETERIZATION    ? COLONOSCOPY    ? CORONARY STENT INTERVENTION N/A 11/06/2017  ? Procedure: CORONARY STENT INTERVENTION;  Surgeon: [name], MD;  Location: [place] [facility];  Service: Cardiovascular;  Laterality: N/A;  ? LEFT HEART CATH AND CORONARY ANGIOGRAPHY N/A 11/06/2017  ? Procedure: LEFT HEART CATH AND CORONARY ANGIOGRAPHY;  Surgeon: [name], MD;  Location: [place] [facility];  Service: Cardiovascular;  Laterality: N/A;  ? LEFT HEART CATH AND CORONARY ANGIOGRAPHY N/A 07/24/2019  ? Procedure: LEFT HEART CATH AND CORONARY ANGIOGRAPHY;  Surgeon: Burnell Blanks, MD;  Location: [place] CV LAB;  Service: Cardiovascular;  Laterality: N/A;  ? RIGHT/LEFT HEART CATH AND CORONARY ANGIOGRAPHY N/A 09/29/2020  ? Procedure: RIGHT/LEFT HEART CATH AND CORONARY ANGIOGRAPHY;  Surgeon: Belva Crome, MD;  Location: [place] CV [facility];  Service: Cardiovascular;  Laterality: N/A;  ? ROBOTIC ASSISTED BILATERAL SALPINGO OOPHERECTOMY Bilateral 11/10/2020  ? Procedure: XI ROBOTIC ASSISTED BILATERAL SALPINGO OOPHORECTOMY WITH MINI LAPAROTOMY FOR DRAINAGE;  Surgeon: Lafonda Mosses, MD;  Location: [facility];  Service: Gynecology;  Laterality: Bilateral;  MINI LAP FIRST  ? TONSILLECTOMY    ? VULVECTOMY N/A 11/10/2020  ? Procedure: WIDE EXCISION VULVECTOMY;  Surgeon: Lafonda Mosses, MD;  [facility];  Service: Gynecology;  Laterality: N/A;  ? ?Patient Active Problem List  ? Diagnosis Date Noted  ? Tremor of both hands 08/16/2021  ? Aortic atherosclerosis ([place]) 06/15/2021  ? Generalized osteoarthritis of multiple sites 12/25/2020  ? Chronic pain disorder 12/25/2020  ? Pelvic mass in female   ? Bilateral lower extremity edema 04/13/2020  ? Shortness of breath   ? Anxiety disorder 10/29/2018  ? CKD (chronic kidney disease), stage III ([place]) 06/27/2018  ? COPD (chronic obstructive pulmonary disease) ([place]) 05/25/2018  ? Obesity 03/20/2018  ?  Dyspnea on exertion 11/06/2017  ? Abnormal stress test 11/06/2017  ? Chronic diastolic CHF (congestive heart failure) ([place]) 10/03/2017  ? Chronic combined systolic and diastolic heart failure ([place]) 09/04/2017  ? Left ventricular dysfunction 07/31/2017  ? Cataract, nuclear sclerotic senile, bilateral 02/01/2017  ? Gouty arthritis of toe of left foot 08/09/2016  ? Hyperuricemia 08/09/2016  ? Osteoarthritis (arthritis due to wear and tear of joints) 01/14/2014  ? Coronary artery disease 08/02/2011  ? Mitral regurgitation 08/02/2011  ? OVERACTIVE BLADDER 07/02/2010  ? ACUTE CYSTITIS 05/25/2010  ? Pure hypercholesterolemia 08/11/2009  ? PEDAL EDEMA 07/22/2009  ? GERD 04/11/2008  ? Essential hypertension 05/30/2007  ? MYOCARDIAL INFARCTION, HX OF 05/30/2007  ? Allergic rhinitis 05/30/2007  ? LOW BACK PAIN 05/30/2007  ? ? ?REFERRING DIAG: R42 (ICD-10-CM) - Dizziness ? ?THERAPY DIAG:  ?Dizziness and giddiness ? ?Unsteadiness on feet ? ?Repeated falls ? ?Muscle weakness (generalized) ? ?Difficulty in walking, not elsewhere classified ? ?PERTINENT HISTORY: CAD, arthritis ? ?PRECAUTIONS: Fall ? ?SUBJECTIVE: Pt denies dizziness and states that she is ready for discharge ? ?PAIN:  ?Are you having pain? Yes: NPRS scale: 3/10 ?Pain location: right shoulder ?Pain description: just a little ache ?Aggravating factors: unknown ?Relieving factors: adhesive heat pads ? ?PATIENT GOALS: To not have double vision and walk without dizziness. ? ?OBJECTIVE:  ?  ?DIAGNOSTIC FINDINGS: MRI on 11/27/21 IMPRESSION:  No acute or reversible finding. Mild chronic small-vessel ischemic  changes of the pons and cerebellum. Moderate chronic small-vessel  ischemic changes of the cerebral hemispheric white matter. Old  lacunar infarction left caudate body.  DaTScan on 12/07/21 was unremarkable. ?  ?COGNITION: ?Overall cognitive status: Within functional limits for tasks assessed ?  ?STRENGTH:  ?01/06/2022:  BLE strength at least 4+/5 throughout ?12/23/2321:   approximately 4 to 4+/5 throughout bilat LE ?  ?  ?FUNCTIONAL TESTs:  ?Berg Balance Scale: 50/56  on 12/23/2021 ?  ?PATIENT SURVEYS:  ?  ?11/11/2021: ?DHI Total Score: 60 / 100   ?Physical Score: 12 / 28   ?Emotional Score: 22 / 36   ?Functional Score: 26 / 36 ?  ?12/23/2021: ?DHI Total Score: 42 / 100 ?Physical Score: 8 / 28 ?Emotional Score: 18 / 36 ?Functional Score: 16 / 36 ?  ?  ?VESTIBULAR ASSESSMENT ?  ?           GENERAL OBSERVATION: Pt continues with increased fear of falling. ?            ?           SYMPTOM BEHAVIOR: ?                      Subjective history: Pt reports feeling dizzy about 25%  of the day now ?                      Non-Vestibular symptoms: headaches and tinnitus ?                      Type of dizziness: Lightheadedness/Faint ?                      Frequency: 25% of the day ?                      Duration: reports a brief duration ?                      Aggravating factors: Induced by motion: turning head quickly ?                      Relieving factors: head stationary ?                      Progression of symptoms: better ?  ?  ?PATIENT EDUCATION: ?Education details: Reviewed HEP ?Person educated: Patient ?Education method: Explanation and Demonstration ?Education comprehension: verbalized understanding and returned demonstration ?  ?TREATMENT: ?  ?01/06/2022: ?Nustep L5 x6 minutes with PT present to discuss status ?Ambulation outside without assistive device with small standing recovery periods secondary to fatigue ?Able to maintain single leg stance on each side with gentle single finger support greater than 10 sec ?Palloff Press standing on foam with yellow tband 2x10 each way ?High Marching and side stepping on trampoline x1 min each ?Tandem gait with unilateral UE support at balance bar x4 laps ? ?01/04/2022: ?Nustep L5 x6 minutes with PT present to discuss status ?Alt LE toe tap to 2 cones 2x10 reps with CGA ?Tandem gait on AirEx beam x4 laps in parallel bars for balance ?Side stepping on  AirEx beam x4 laps each direction in parallel bars for balance ?VOR 1x1 standing on blue foam in parallel bars x1 min horizontal, x1 min vertical ?Ambulation down hallway performing head turns:  x2 vertic

## 2022-01-10 NOTE — Progress Notes (Signed)
?Cardiology Office Note:   ? ?Date:  01/11/2022  ? ?ID:  Tracy [name], DOB 1946-09-27, MRN [id number] ? ?PCP:  [name], [name], MD ?  ?[facility] Providers ?Cardiologist:  Sherren Mocha, MD ?Cardiology APP:  Liliane Shi, PA-C    ? ?Referring MD: [name], [name], MD  ? ?Chief Complaint  ?Patient presents with  ? Coronary Artery Disease  ? ? ?History of Present Illness:   ? ?Tracy [name] is a 75 y.o. female with a hx of: ?Coronary artery disease  ?S/p BMS to LCx in 2007 ?S/p DES to LCx in 10/2017 ?Myoview 07/2019: ant ischemia; high risk ?Cath 07/2019: patent LCx stent, mod diff dz in LAD and mild dz in RCA - med Rx  ?Heart failure with preserved ejection fraction  ?Echocardiogram 12/18: EF 50-55, Gr 1 DD ?Chronic kidney disease 3 ?COPD ?Hypertension  ?Hyperlipidemia  ?Elevated LFTs  ?Aortic atherosclerosis (CT 11/2020) ?S/p BSO, mini-lap for drainage of contained cyst (10/2020) ? ?In reviewing the patient's chart, she was referred to Dr. Debara Pickett last year for help with managing her lipids.  She has been intolerant to a number of statin drugs.  She was managed with a combination of pravastatin and ezetimibe at that time with an LDL cholesterol of 86 mg/dL.  Continued lifestyle modification was recommended. ? ?The patient is here alone today.  We discussed her medical issues since her last evaluation here.  She is undergone fairly extensive evaluation for a left hand tremor as well as dizziness/gait unsteadiness.  Neurology notes are reviewed.  She is also seen ENT and ophthalmology.  Brain MRI showed atrophy and a remote lacunar infarct.  From a cardiac standpoint, she does report a recent episode of a sharp left-sided chest pain that occurred at rest.  She took 2 nitroglycerin with resolution of symptoms.  Symptoms lasted for a few minutes and have not recurred.  She has not had any exertional angina.  She denies dyspnea, orthopnea, or PND.  No heart palpitations, lightheadedness, or syncope.  She  reports chronic left leg swelling.  She is ambulating with a cane.  She has done physical therapy for gait unsteadiness and states this is helped her quite a bit. ? ?Past Medical History:  ?Diagnosis Date  ? Allergic rhinitis   ? Allergy   ? Anxiety   ? Arthritis   ? Bronchitis   ? CAD (coronary artery disease)   ? 1/19 PCI/DES to [place] for ISR, normal EF.   ? Cancer [facility])   ? hx of precancerous cells in right breast   ? Cataract   ? Cervical dysplasia   ? unsure of procedure, possible "burning" in her late 1s  ? CHF (congestive heart failure) ([place])   ? Echo 06/2019: EF 55-60, elevated LVEDP, normal RV SF, mild MAC, mild MR, trivial TR  ? Complication of anesthesia   ? COPD (chronic obstructive pulmonary disease) ([place])   ? early   ? Depression   ? Dyspnea   ? with exertion   ? Family history of adverse reaction to anesthesia   ? daughter- problems wiht n/v  ? GERD (gastroesophageal reflux disease)   ? Gout   ? Headache(784.0)   ? Heart murmur   ? hx of years ago   ? Hyperlipidemia   ? Hypertension   ? Low back pain   ? Menopausal syndrome   ? Myocardial infarct [facility]) 2007  ? hx of  ? Overactive bladder   ? PONV (postoperative  nausea and vomiting)   ? Sleep apnea   ? ? ?Past Surgical History:  ?Procedure Laterality Date  ? ANGIOPLASTY    ? stent 2007  ? BREAST LUMPECTOMY WITH RADIOACTIVE SEED LOCALIZATION Right 04/28/2020  ? Procedure: RIGHT BREAST LUMPECTOMY X 2  WITH RADIOACTIVE SEED LOCALIZATION;  Surgeon: Donnie Mesa, MD;  Location: [place];  Service: General;  Laterality: Right;  LMA  ? CARDIAC CATHETERIZATION    ? COLONOSCOPY    ? CORONARY STENT INTERVENTION N/A 11/06/2017  ? Procedure: CORONARY STENT INTERVENTION;  Surgeon: [name], MD;  Location: [place] [facility];  Service: Cardiovascular;  Laterality: N/A;  ? LEFT HEART CATH AND CORONARY ANGIOGRAPHY N/A 11/06/2017  ? Procedure: LEFT HEART CATH AND CORONARY ANGIOGRAPHY;  Surgeon: [name], MD;  Location: [place] [facility];   Service: Cardiovascular;  Laterality: N/A;  ? LEFT HEART CATH AND CORONARY ANGIOGRAPHY N/A 07/24/2019  ? Procedure: LEFT HEART CATH AND CORONARY ANGIOGRAPHY;  Surgeon: Burnell Blanks, MD;  Location: [place] [facility];  Service: Cardiovascular;  Laterality: N/A;  ? RIGHT/LEFT HEART CATH AND CORONARY ANGIOGRAPHY N/A 09/29/2020  ? Procedure: RIGHT/LEFT HEART CATH AND CORONARY ANGIOGRAPHY;  Surgeon: Belva Crome, MD;  Location: [employer] [facility];  Service: Cardiovascular;  Laterality: N/A;  ? ROBOTIC ASSISTED BILATERAL SALPINGO OOPHERECTOMY Bilateral 11/10/2020  ? Procedure: XI ROBOTIC ASSISTED BILATERAL SALPINGO OOPHORECTOMY WITH MINI LAPAROTOMY FOR DRAINAGE;  Surgeon: Lafonda Mosses, MD;  Location: [facility];  Service: Gynecology;  Laterality: Bilateral;  MINI LAP FIRST  ? TONSILLECTOMY    ? VULVECTOMY N/A 11/10/2020  ? Procedure: WIDE EXCISION VULVECTOMY;  Surgeon: Lafonda Mosses, MD;  Location: [facility];  Service: Gynecology;  Laterality: N/A;  ? ? ?Current Medications: ?Current Meds  ?Medication Sig  ? acetaminophen (TYLENOL) 500 MG tablet Take 500-1,000 mg by mouth every 6 (six) hours as needed for headache.   ? albuterol (PROVENTIL) (2.5 MG/3ML) 0.083% nebulizer solution Take 3 mLs (2.5 mg total) by nebulization every 6 (six) hours as needed for wheezing or shortness of breath.  ? albuterol (VENTOLIN HFA) 108 (90 Base) MCG/ACT inhaler Inhale 2 puffs into the lungs every 4 (four) hours as needed for wheezing. (Patient taking differently: Inhale 2 puffs into the lungs as needed for wheezing.)  ? allopurinol (ZYLOPRIM) 100 MG tablet TAKE 1 TABLET BY MOUTH EVERY DAY  ? amLODipine (NORVASC) 5 MG tablet TAKE 1 TABLET (5 MG TOTAL) BY MOUTH DAILY.  ? APPLE CIDER VINEGAR PO Take 2,400 mg by mouth daily.  ? ASPERCREME LIDOCAINE EX Apply 1 application topically daily as needed (pain).  ? aspirin EC 81 MG tablet Take 81 mg by mouth daily.  ? cetirizine (ZYRTEC) 10 MG tablet Take 10 mg by mouth daily.  ?  Cholecalciferol (VITAMIN D3) 50 MCG (2000 UT) TABS Take 4,000 Units by mouth daily.   ? clopidogrel (PLAVIX) 75 MG tablet TAKE 1 TABLET BY MOUTH DAILY WITH BREAKFAST.  ? Cyanocobalamin (VITAMIN B-12) 5000 MCG SUBL Place 5,000 mcg under the tongue daily.   ? ezetimibe (ZETIA) 10 MG tablet TAKE 1 TABLET BY MOUTH DAILY  ? fluticasone (FLONASE) 50 MCG/ACT nasal spray SPRAY 2 SPRAYS INTO EACH NOSTRIL EVERY DAY (Patient taking differently: Place 2 sprays into both nostrils daily.)  ? furosemide (LASIX) 40 MG tablet TAKE 1 TABLET BY MOUTH EVERY DAY  ? guaiFENesin (MUCINEX) 600 MG 12 hr tablet Take 600 mg by mouth 2 (two) times daily as needed (congestion).  ?  isosorbide mononitrate (IMDUR) 30 MG 24 hr tablet Take 1 tablet (30 mg total) by mouth daily.  ? losartan (COZAAR) 100 MG tablet TAKE 1 TABLET BY MOUTH EVERY DAY  ? metoprolol succinate (TOPROL-XL) 100 MG 24 hr tablet TAKE 1 TABLET BY MOUTH DAILY. TAKE WITH OR IMMEDIATELY FOLLOWING A MEAL.  ? Misc Natural Products (TART CHERRY ADVANCED) CAPS Take 1,000 capsules by mouth daily.   ? pantoprazole (PROTONIX) 40 MG tablet TAKE 1 TABLET BY MOUTH EVERY DAY  ? potassium chloride SA (KLOR-CON) 20 MEQ tablet Take 20 mEq by mouth daily.  ? Simethicone (GAS-X PO) Take 2 tablets by mouth daily as needed (gas).  ? triamcinolone cream (KENALOG) 0.1 % APPLY TO AFFECTED AREA TWICE A DAY  ? ?Current Facility-Administered Medications for the 01/11/22 encounter (Office Visit) with Sherren Mocha, MD  ?Medication  ? 0.9 %  sodium chloride infusion  ?  ? ?Allergies:   Erythromycin and Sulfamethoxazole  ? ?Social History  ? ?Socioeconomic History  ? Marital status: Married  ?  Spouse name: Not on file  ? Number of children: 2  ? Years of education: Not on file  ? Highest education level: GED or equivalent  ?Occupational History  ? Occupation: retired  ? 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 Smoking status: Former  ?  Packs/day: 1.25  ?  Years: 52.00  ?  Pack years:  65.00  ?  Types: Cigarettes  ?  Quit date: 2018  ?  Years since quitting: 5.2  ? Smokeless tobacco: Never  ? Tobacco comments:  ?  completely quit May of 2018; period of years she did not smoke   ?Vaping

## 2022-01-11 ENCOUNTER — Encounter: Payer: Self-pay | Admitting: Cardiovascular Disease

## 2022-01-11 ENCOUNTER — Other Ambulatory Visit: Payer: Self-pay

## 2022-01-11 ENCOUNTER — Ambulatory Visit (INDEPENDENT_AMBULATORY_CARE_PROVIDER_SITE_OTHER): Payer: Medicare Other | Admitting: Cardiovascular Disease

## 2022-01-11 VITALS — BP 128/100 | HR 88 | Ht 65.0 in | Wt 201.2 lb

## 2022-01-11 DIAGNOSIS — I25118 Atherosclerotic heart disease of native coronary artery with other forms of angina pectoris: Secondary | ICD-10-CM

## 2022-01-11 DIAGNOSIS — R7989 Other specified abnormal findings of blood chemistry: Secondary | ICD-10-CM | POA: Diagnosis not present

## 2022-01-11 DIAGNOSIS — I10 Essential (primary) hypertension: Secondary | ICD-10-CM

## 2022-01-11 DIAGNOSIS — E782 Mixed hyperlipidemia: Secondary | ICD-10-CM | POA: Diagnosis not present

## 2022-01-11 DIAGNOSIS — I2510 Atherosclerotic heart disease of native coronary artery without angina pectoris: Secondary | ICD-10-CM

## 2022-01-11 NOTE — Patient Instructions (Signed)
Medication Instructions:  ?Your physician recommends that you continue on your current medications as directed. Please refer to the Current Medication list given to you today. ? ?*If you need a refill on your cardiac medications before your next appointment, please call your pharmacy* ? ? ?Lab Work: ?NONE ?If you have labs (blood work) drawn today and your tests are completely normal, you will receive your results only by: ?MyChart Message (if you have MyChart) OR ?A paper copy in the mail ?If you have any lab test that is abnormal or we need to change your treatment, we will call you to review the results. ? ? ?Testing/Procedures: ?NONE ? ? ?Follow-Up: ?At [facility], you and your health needs are our priority.  As part of our continuing mission to provide you with exceptional heart care, we have created designated Provider Care Teams.  These Care Teams include your primary Cardiologist (physician) and Advanced Practice Providers (APPs -  Physician Assistants and Nurse Practitioners) who all work together to provide you with the care you need, when you need it. ? ?We recommend signing up for the patient portal called "MyChart".  Sign up information is provided on this After Visit Summary.  MyChart is used to connect with patients for Virtual Visits (Telemedicine).  Patients are able to view lab/test results, encounter notes, upcoming appointments, etc.  Non-urgent messages can be sent to your provider as well.   ?To learn more about what you can do with MyChart, go to [email or web address].   ? ?Your next appointment:   ?6 month(s) ? ?The format for your next appointment:   ?In Person ? ?Provider:   ?Richardson Dopp, PA-C     Then, Sherren Mocha, MD will plan to see you again in 1 year(s).  ? ?  ?

## 2022-01-13 ENCOUNTER — Other Ambulatory Visit: Payer: Self-pay | Admitting: Family Medicine

## 2022-01-13 DIAGNOSIS — Z853 Personal history of malignant neoplasm of breast: Secondary | ICD-10-CM

## 2022-01-16 ENCOUNTER — Emergency Department (HOSPITAL_COMMUNITY): Payer: Medicare Other

## 2022-01-16 ENCOUNTER — Encounter (HOSPITAL_COMMUNITY): Payer: Self-pay

## 2022-01-16 ENCOUNTER — Emergency Department (HOSPITAL_COMMUNITY)
Admission: EM | Admit: 2022-01-16 | Discharge: 2022-01-16 | Disposition: A | Discharge status: Home / Self Care | Payer: Medicare Other | Attending: Emergency Medicine | Admitting: Emergency Medicine

## 2022-01-16 ENCOUNTER — Other Ambulatory Visit: Payer: Self-pay

## 2022-01-16 DIAGNOSIS — I509 Heart failure, unspecified: Secondary | ICD-10-CM | POA: Insufficient documentation

## 2022-01-16 DIAGNOSIS — R251 Tremor, unspecified: Secondary | ICD-10-CM | POA: Diagnosis not present

## 2022-01-16 DIAGNOSIS — W19XXXA Unspecified fall, initial encounter: Secondary | ICD-10-CM

## 2022-01-16 DIAGNOSIS — Z7901 Long term (current) use of anticoagulants: Secondary | ICD-10-CM | POA: Insufficient documentation

## 2022-01-16 DIAGNOSIS — Y92512 Supermarket, store or market as the place of occurrence of the external cause: Secondary | ICD-10-CM | POA: Diagnosis not present

## 2022-01-16 DIAGNOSIS — Z7982 Long term (current) use of aspirin: Secondary | ICD-10-CM | POA: Diagnosis not present

## 2022-01-16 DIAGNOSIS — I130 Hypertensive heart and chronic kidney disease with heart failure and stage 1 through stage 4 chronic kidney disease, or unspecified chronic kidney disease: Secondary | ICD-10-CM | POA: Insufficient documentation

## 2022-01-16 DIAGNOSIS — R55 Syncope and collapse: Secondary | ICD-10-CM | POA: Diagnosis not present

## 2022-01-16 DIAGNOSIS — N189 Chronic kidney disease, unspecified: Secondary | ICD-10-CM | POA: Insufficient documentation

## 2022-01-16 DIAGNOSIS — W01198A Fall on same level from slipping, tripping and stumbling with subsequent striking against other object, initial encounter: Secondary | ICD-10-CM | POA: Insufficient documentation

## 2022-01-16 DIAGNOSIS — S199XXA Unspecified injury of neck, initial encounter: Secondary | ICD-10-CM | POA: Diagnosis not present

## 2022-01-16 DIAGNOSIS — Z79899 Other long term (current) drug therapy: Secondary | ICD-10-CM | POA: Diagnosis not present

## 2022-01-16 DIAGNOSIS — J449 Chronic obstructive pulmonary disease, unspecified: Secondary | ICD-10-CM | POA: Insufficient documentation

## 2022-01-16 DIAGNOSIS — Z7951 Long term (current) use of inhaled steroids: Secondary | ICD-10-CM | POA: Diagnosis not present

## 2022-01-16 DIAGNOSIS — S0003XA Contusion of scalp, initial encounter: Secondary | ICD-10-CM | POA: Diagnosis not present

## 2022-01-16 DIAGNOSIS — I10 Essential (primary) hypertension: Secondary | ICD-10-CM | POA: Diagnosis not present

## 2022-01-16 DIAGNOSIS — S0990XA Unspecified injury of head, initial encounter: Secondary | ICD-10-CM | POA: Diagnosis not present

## 2022-01-16 DIAGNOSIS — R911 Solitary pulmonary nodule: Secondary | ICD-10-CM | POA: Diagnosis not present

## 2022-01-16 LAB — COMPREHENSIVE METABOLIC PANEL
ALT: 16 U/L (ref 0–44)
AST: 20 U/L (ref 15–41)
Albumin: 3.5 g/dL (ref 3.5–5.0)
Alkaline Phosphatase: 71 U/L (ref 38–126)
Anion gap: 8 (ref 5–15)
BUN: 32 mg/dL — ABNORMAL HIGH (ref 8–23)
CO2: 25 mmol/L (ref 22–32)
Calcium: 8.9 mg/dL (ref 8.9–10.3)
Chloride: 106 mmol/L (ref 98–111)
Creatinine, Ser: 1.58 mg/dL — ABNORMAL HIGH (ref 0.44–1.00)
GFR, Estimated: 34 mL/min — ABNORMAL LOW (ref >60)
Glucose, Bld: 107 mg/dL — ABNORMAL HIGH (ref 70–99)
Potassium: 3.9 mmol/L (ref 3.5–5.1)
Sodium: 139 mmol/L (ref 135–145)
Total Bilirubin: 0.6 mg/dL (ref 0.3–1.2)
Total Protein: 7.7 g/dL (ref 6.5–8.1)

## 2022-01-16 LAB — URINALYSIS, ROUTINE W REFLEX MICROSCOPIC
Bilirubin Urine: NEGATIVE
Glucose, UA: NEGATIVE mg/dL
Hgb urine dipstick: NEGATIVE
Ketones, ur: NEGATIVE mg/dL
Nitrite: NEGATIVE
Protein, ur: NEGATIVE mg/dL
Specific Gravity, Urine: 1.023 (ref 1.005–1.030)
pH: 5 (ref 5.0–8.0)

## 2022-01-16 LAB — CBC WITH DIFFERENTIAL/PLATELET
Abs Immature Granulocytes: 0.03 10*3/uL (ref 0.00–0.07)
Basophils Absolute: 0.1 10*3/uL (ref 0.0–0.1)
Basophils Relative: 1 %
Eosinophils Absolute: 0.3 10*3/uL (ref 0.0–0.5)
Eosinophils Relative: 3 %
HCT: 41.5 % (ref 36.0–46.0)
Hemoglobin: 13.3 g/dL (ref 12.0–15.0)
Immature Granulocytes: 0 %
Lymphocytes Relative: 30 %
Lymphs Abs: 2.6 10*3/uL (ref 0.7–4.0)
MCH: 28.6 pg (ref 26.0–34.0)
MCHC: 32 g/dL (ref 30.0–36.0)
MCV: 89.2 fL (ref 80.0–100.0)
Monocytes Absolute: 0.9 10*3/uL (ref 0.1–1.0)
Monocytes Relative: 11 %
Neutro Abs: 4.7 10*3/uL (ref 1.7–7.7)
Neutrophils Relative %: 55 %
Platelets: 209 10*3/uL (ref 150–400)
RBC: 4.65 MIL/uL (ref 3.87–5.11)
RDW: 15.7 % — ABNORMAL HIGH (ref 11.5–15.5)
WBC: 8.5 10*3/uL (ref 4.0–10.5)
nRBC: 0 % (ref 0.0–0.2)

## 2022-01-16 LAB — TROPONIN I (HIGH SENSITIVITY)
Troponin I (High Sensitivity): 9 ng/L (ref <18)
Troponin I (High Sensitivity): 11 ng/L (ref <18)

## 2022-01-16 MED ORDER — LACTATED RINGERS IV BOLUS
500.0000 mL | Freq: Once | INTRAVENOUS | Status: AC
  Administered 2022-01-16: 500 mL via INTRAVENOUS

## 2022-01-16 MED ORDER — ACETAMINOPHEN 325 MG PO TABS
650.0000 mg | ORAL_TABLET | Freq: Once | ORAL | Status: AC
  Administered 2022-01-16: 650 mg via ORAL
  Filled 2022-01-16: qty 2

## 2022-01-16 NOTE — ED Triage Notes (Signed)
Pt arrived via EMS following a fall at the grocery store, pt was bending down to throw something away and lost her balance, pt stated she hit her head on the left side, no LOC, pt on plavix ?

## 2022-01-16 NOTE — ED Notes (Signed)
Pt ambulated independently with stand by assist using her cane with a steady gait down the hall to the restroom and back to room ?

## 2022-01-16 NOTE — ED Provider Notes (Signed)
?[facility] ?Provider Note ? ? ?CSN: [id number] ?Arrival date & time: 01/16/22  1713 ? ?  ? ?History ? ?Chief Complaint  ?Patient presents with  ? Fall  ? ? ?Tracy [name] is a 76 y.o. female. ? ?HPI ?Patient is a 76 year old female who presents after a fall.  Her medical history includes HLD, HTN, MI, GERD, chronic pain, arthritis, gout, CHF, obesity, COPD, CKD, anxiety.  She is not on anticoagulation but does take aspirin and Plavix.  Fall occurred just prior to arrival.  At the time she was at the store.  She experienced a brief episode of dizziness which caused her to fall.  When she fell, she struck the left frontal scalp on the edge of a door.  She was noted to have a closed hematoma in this area.  This is where she has her current headache pain.  She denies any other areas of pain.  During this episode, she did not lose consciousness.  She has been ambulatory since the fall.   ?  ? ?Home Medications ?Prior to Admission medications   ?Medication Sig Start Date End Date Taking? Authorizing Provider  ?acetaminophen (TYLENOL) 500 MG tablet Take 500-1,000 mg by mouth every 6 (six) hours as needed for headache.     [provider]  ?albuterol (PROVENTIL) (2.5 MG/3ML) 0.083% nebulizer solution Take 3 mLs (2.5 mg total) by nebulization every 6 (six) hours as needed for wheezing or shortness of breath. 06/14/19   Byrum, Rose Fillers, MD  ?albuterol (VENTOLIN HFA) 108 (90 Base) MCG/ACT inhaler Inhale 2 puffs into the lungs every 4 (four) hours as needed for wheezing. ?Patient taking differently: Inhale 2 puffs into the lungs as needed for wheezing. 10/12/20   Collene Gobble, MD  ?allopurinol (ZYLOPRIM) 100 MG tablet TAKE 1 TABLET BY MOUTH EVERY DAY 07/12/21   [name], [name], MD  ?amLODipine (NORVASC) 5 MG tablet TAKE 1 TABLET (5 MG TOTAL) BY MOUTH DAILY. 09/23/21   Sherren Mocha, MD  ?Jearl Klinefelter ELLIPTA 62.5-25 MCG/INH AEPB INHALE 1 PUFF BY MOUTH EVERY DAY ?Patient not  taking: Reported on 01/11/2022 12/28/20   Collene Gobble, MD  ?APPLE CIDER VINEGAR PO Take 2,400 mg by mouth daily.    [provider]  ?ASPERCREME LIDOCAINE EX Apply 1 application topically daily as needed (pain).    [provider]  ?aspirin EC 81 MG tablet Take 81 mg by mouth daily.    [provider]  ?Camphor-Eucalyptus-Menthol (VICKS VAPORUB EX) Apply 1 application topically daily as needed (congestion). ?Patient not taking: Reported on 01/11/2022    [provider]  ?cetirizine (ZYRTEC) 10 MG tablet Take 10 mg by mouth daily.    [provider]  ?Cholecalciferol (VITAMIN D3) 50 MCG (2000 UT) TABS Take 4,000 Units by mouth daily.     [provider]  ?clopidogrel (PLAVIX) 75 MG tablet TAKE 1 TABLET BY MOUTH DAILY WITH BREAKFAST. 07/23/21   Sherren Mocha, MD  ?Cyanocobalamin (VITAMIN B-12) 5000 MCG SUBL Place 5,000 mcg under the tongue daily.     [provider]  ?ezetimibe (ZETIA) 10 MG tablet TAKE 1 TABLET BY MOUTH DAILY 02/19/21   Sherren Mocha, MD  ?fluticasone (FLONASE) 50 MCG/ACT nasal spray SPRAY 2 SPRAYS INTO EACH NOSTRIL EVERY DAY ?Patient taking differently: Place 2 sprays into both nostrils daily. 02/20/20   Collene Gobble, MD  ?furosemide (LASIX) 40 MG tablet TAKE 1 TABLET BY MOUTH EVERY DAY 12/31/21   Sherren Mocha,  MD  ?guaiFENesin (MUCINEX) 600 MG 12 hr tablet Take 600 mg by mouth 2 (two) times daily as needed (congestion).    [provider]  ?isosorbide mononitrate (IMDUR) 30 MG 24 hr tablet Take 1 tablet (30 mg total) by mouth daily. 03/04/21   Sherren Mocha, MD  ?losartan (COZAAR) 100 MG tablet TAKE 1 TABLET BY MOUTH EVERY DAY 05/25/21   [name], [name], MD  ?meclizine (ANTIVERT) 25 MG tablet Take 1 tablet (25 mg total) by mouth 2 (two) times daily as needed for dizziness. ?Patient not taking: Reported on 01/11/2022 10/27/21   [name], [name], MD  ?metoprolol succinate (TOPROL-XL) 100 MG 24 hr tablet TAKE 1 TABLET BY MOUTH  DAILY. TAKE WITH OR IMMEDIATELY FOLLOWING A MEAL. 04/05/21   Richardson Dopp T, PA-C  ?Misc Natural Products (TART CHERRY ADVANCED) CAPS Take 1,000 capsules by mouth daily.     [provider]  ?oxymetazoline (AFRIN) 0.05 % nasal spray Place 1 spray into both nostrils 2 (two) times daily as needed for congestion. ?Patient not taking: Reported on 01/11/2022    [provider]  ?pantoprazole (PROTONIX) 40 MG tablet TAKE 1 TABLET BY MOUTH EVERY DAY 03/19/21   [name], [name], MD  ?potassium chloride SA (KLOR-CON) 20 MEQ tablet Take 20 mEq by mouth daily.    [provider]  ?pravastatin (PRAVACHOL) 40 MG tablet Take 1 tablet (40 mg total) by mouth every evening. 03/01/21 06/04/21  [name], MD  ?Simethicone (GAS-X PO) Take 2 tablets by mouth daily as needed (gas).    [provider]  ?triamcinolone cream (KENALOG) 0.1 % APPLY TO AFFECTED AREA TWICE A DAY 11/22/21   [name], [name], MD  ?nitroGLYCERIN (NITROSTAT) 0.4 MG SL tablet Place 1 tablet (0.4 mg total) under the tongue every 5 (five) minutes as needed for chest pain. 09/22/20 11/28/20  Richardson Dopp T, PA-C  ?   ? ?Allergies    ?Erythromycin and Sulfamethoxazole   ? ?Review of Systems   ?Review of Systems  ?Neurological:  Positive for dizziness and headaches.  ?All other systems reviewed and are negative. ? ?Physical Exam ?Updated Vital Signs ?BP 133/65 (BP Location: Left Arm)   Pulse 89   Temp 98.5 ?F (36.9 ?C) (Oral)   Resp 18   Ht '5\' 5"'$  (1.651 m)   Wt 91.2 kg   SpO2 96%   BMI 33.45 kg/m?  ?Physical Exam ?Vitals and nursing note reviewed.  ?Constitutional:   ?   General: She is not in acute distress. ?   Appearance: Normal appearance. She is well-developed. She is not ill-appearing, toxic-appearing or diaphoretic.  ?HENT:  ?   Head: Normocephalic.  ?   Comments: Hematoma to left frontal scalp ?   Right Ear: External ear normal.  ?   Left Ear: External ear normal.  ?   Nose: Nose normal.  ?   Mouth/Throat:  ?   Mouth:  Mucous membranes are moist.  ?   Pharynx: Oropharynx is clear.  ?Eyes:  ?   Extraocular Movements: Extraocular movements intact.  ?   Conjunctiva/sclera: Conjunctivae normal.  ?Cardiovascular:  ?   Rate and Rhythm: Normal rate and regular rhythm.  ?   Heart sounds: No murmur heard. ?Pulmonary:  ?   Effort: Pulmonary effort is normal. No respiratory distress.  ?   Breath sounds: Normal breath sounds.  ?Chest:  ?   Chest wall: No tenderness.  ?Abdominal:  ?   Palpations: Abdomen is soft.  ?  Tenderness: There is no abdominal tenderness.  ?Musculoskeletal:     ?   General: No swelling. Normal range of motion.  ?   Cervical back: Normal range of motion and neck supple. No tenderness.  ?   Right lower leg: No edema.  ?   Left lower leg: No edema.  ?Skin: ?   General: Skin is warm and dry.  ?   Capillary Refill: Capillary refill takes less than 2 seconds.  ?   Coloration: Skin is not jaundiced or pale.  ?Neurological:  ?   General: No focal deficit present.  ?   Mental Status: She is alert and oriented to person, place, and time.  ?   Cranial Nerves: No cranial nerve deficit.  ?   Sensory: No sensory deficit.  ?   Motor: Tremor (Baseline) present. No weakness.  ?   Coordination: Coordination normal.  ?   Gait: Gait normal.  ?Psychiatric:     ?   Mood and Affect: Mood normal.     ?   Behavior: Behavior normal.     ?   Thought Content: Thought content normal.     ?   Judgment: Judgment normal.  ? ? ?ED Results / Procedures / Treatments   ?Labs ?(all labs ordered are listed, but only abnormal results are displayed) ?Labs Reviewed  ?CBC WITH DIFFERENTIAL/PLATELET - Abnormal; Notable for the following components:  ?    Result Value  ? RDW 15.7 (*)   ? All other components within normal limits  ?COMPREHENSIVE METABOLIC PANEL - Abnormal; Notable for the following components:  ? Glucose, Bld 107 (*)   ? BUN 32 (*)   ? Creatinine, Ser 1.58 (*)   ? GFR, Estimated 34 (*)   ? All other components within normal limits  ?URINALYSIS,  ROUTINE W REFLEX MICROSCOPIC - Abnormal; Notable for the following components:  ? APPearance HAZY (*)   ? Leukocytes,Ua SMALL (*)   ? Bacteria, UA RARE (*)   ? All other components within normal limits  ?TROPONIN I (HIGH

## 2022-01-18 NOTE — Progress Notes (Signed)
? ?HPI: ? ?Tracy [name] is a 76 y.o. female with hx of HTN,CAD,CHF,CKD,COPD,and anxiety here today with her friend to follow on recent ED visit. ?She was last seen here in the office on 10/26/21. ?Evaluated in the ED on 01/16/2022 after fall while she was at the store and proceeded by episode of dizziness.  She did not have her walker or cane, was holding a shopping cart.  ?When she fell, she struck the left frontal scalp on the edge of a door, negative for LOC. ?Scalp hematoma, concerned about facial bruising noted a couple days after injury. ? ?Troponin x 2 in normal range. ?Lab Results  ?Component Value Date  ? CREATININE 1.58 (H) 01/16/2022  ? BUN 32 (H) 01/16/2022  ? NA 139 01/16/2022  ? K 3.9 01/16/2022  ? CL 106 01/16/2022  ? CO2 25 01/16/2022  ? ?Lab Results  ?Component Value Date  ? ALT 16 01/16/2022  ? AST 20 01/16/2022  ? ALKPHOS 71 01/16/2022  ? BILITOT 0.6 01/16/2022  ? ?Cervical CT: ?1. No acute fracture or traumatic subluxation of the cervical spine.  ?2. Stable 3 mm right upper lobe nodule. No follow-up needed if patient is low-risk. ? ?Head CT: ?No acute intracranial abnormality.  ?Left frontal scalp hematoma. ? ?Lab Results  ?Component Value Date  ? WBC 8.5 01/16/2022  ? HGB 13.3 01/16/2022  ? HCT 41.5 01/16/2022  ? MCV 89.2 01/16/2022  ? PLT 209 01/16/2022  ? ?She is still having headache on area of trauma, stable. ?No associated visual changes, focal neurologic deficit,nausea,or vomiting. ? ?She has had intermittent episodes of dizziness since 03/2021, first noted while she was driving.Not like spinning, more like lightheadedness. ?Exacerbated by movement. ?Episodes last a few minutes. ?Has had falls since dizziness onset and frequently hit her head/face. ?Hand tremor, L>R. ? ?She has been evaluated by ENT and neurologist. ?Dizziness seems to be exacerbated when turning to her left. ?She completed PT and felt like it helped. ?Her friend wonders if some of her meds can be causing dizziness  and if some meds can be discontinued. ?Carotid US with no significant stenosis,03/2021. 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 She did not seek medical attention at that time. ? ?Brain MRI done on 11/28/21: ?No acute or reversible finding. Mild chronic small-vessel ischemic ?changes of the pons and cerebellum. Moderate chronic small-vessel ?ischemic changes of the cerebral hemispheric white matter. Old lacunar infarction left caudate body. ? ?She is not taking KLOR, has not done so for 7-10 days. ?She is taking Furosemide 40 mg daily prn because causes urinary frequency. ?CAD on Plavix 75 mg daily and Aspirin 81 mg daily. ?She is also on Metoprolol succinate 100 mg daily,Imdur 30 mg daily,and Losartan 100 mg daily. ?Negative for chest pain, dyspnea, palpitation, focal weakness, or worsening edema. ?She saw her cardiologist yesterday. ? ?Review of Systems  ?Constitutional:  Negative for activity change, appetite change and fever.  ?HENT:  Negative for mouth sores, nosebleeds and sore throat.   ?Respiratory:  Negative for cough and wheezing.   ?Gastrointestinal:  Negative for abdominal pain.  ?     Negative for changes in bowel habits.  ?Genitourinary:  Negative for decreased urine volume, dysuria and hematuria.  ?Musculoskeletal:  Positive for gait problem.  ?Neurological:  Negative for syncope and facial asymmetry.  ?Rest see pertinent positives and negatives  per HPI. ? ?Current Outpatient Medications on File Prior to Visit  ?Medication Sig Dispense Refill  ? acetaminophen (TYLENOL) 500 MG tablet Take 500-1,000 mg by mouth every 6 (six) hours as needed for headache.     ? albuterol (VENTOLIN HFA) 108 (90 Base) MCG/ACT inhaler Inhale 2 puffs into the lungs every 4 (four) hours as needed for wheezing. (Patient taking differently: Inhale 2 puffs into the lungs as needed for  wheezing.) 1 each 3  ? allopurinol (ZYLOPRIM) 100 MG tablet TAKE 1 TABLET BY MOUTH EVERY DAY 90 tablet 2  ? amLODipine (NORVASC) 5 MG tablet TAKE 1 TABLET (5 MG TOTAL) BY MOUTH DAILY. 90 tablet 3  ? APPLE CIDER VINEGAR PO Take 2,400 mg by mouth daily.    ? ASPERCREME LIDOCAINE EX Apply 1 application topically daily as needed (pain).    ? aspirin EC 81 MG tablet Take 81 mg by mouth daily.    ? cetirizine (ZYRTEC) 10 MG tablet Take 10 mg by mouth daily.    ? Cholecalciferol (VITAMIN D3) 50 MCG (2000 UT) TABS Take 4,000 Units by mouth daily.     ? clopidogrel (PLAVIX) 75 MG tablet TAKE 1 TABLET BY MOUTH DAILY WITH BREAKFAST. 90 tablet 1  ? Cyanocobalamin (VITAMIN B-12) 5000 MCG SUBL Place 5,000 mcg under the tongue daily.     ? ezetimibe (ZETIA) 10 MG tablet TAKE 1 TABLET BY MOUTH DAILY 90 tablet 3  ? fluticasone (FLONASE) 50 MCG/ACT nasal spray SPRAY 2 SPRAYS INTO EACH NOSTRIL EVERY DAY (Patient taking differently: Place 2 sprays into both nostrils daily.) 48 mL 1  ? furosemide (LASIX) 40 MG tablet TAKE 1 TABLET BY MOUTH EVERY DAY 90 tablet 0  ? guaiFENesin (MUCINEX) 600 MG 12 hr tablet Take 600 mg by mouth 2 (two) times daily as needed (congestion).    ? isosorbide mononitrate (IMDUR) 30 MG 24 hr tablet Take 1 tablet (30 mg total) by mouth daily. 90 tablet 3  ? losartan (COZAAR) 100 MG tablet TAKE 1 TABLET BY MOUTH EVERY DAY 90 tablet 3  ? metoprolol succinate (TOPROL-XL) 100 MG 24 hr tablet TAKE 1 TABLET BY MOUTH DAILY. TAKE WITH OR IMMEDIATELY FOLLOWING A MEAL. 90 tablet 2  ? Misc Natural Products (TART CHERRY ADVANCED) CAPS Take 1,000 capsules by mouth daily.     ? oxymetazoline (AFRIN) 0.05 % nasal spray Place 1 spray into both nostrils 2 (two) times daily as needed for congestion.    ? pantoprazole (PROTONIX) 40 MG tablet TAKE 1 TABLET BY MOUTH EVERY DAY 90 tablet 2  ? potassium chloride SA (KLOR-CON) 20 MEQ tablet Take 20 mEq by mouth daily.    ? Simethicone (GAS-X PO) Take 2 tablets by mouth daily as needed  (gas).    ? triamcinolone cream (KENALOG) 0.1 % APPLY TO AFFECTED AREA TWICE A DAY 30 g 1  ? pravastatin (PRAVACHOL) 40 MG tablet Take 1 tablet (40 mg total) by mouth every evening. 90 tablet 3  ? [DISCONTINUED] nitroGLYCERIN (NITROSTAT) 0.4 MG SL tablet Place 1 tablet (0.4 mg total) under the tongue every 5 (five) minutes as needed for chest pain. 25 tablet 3  ? ?Current Facility-Administered Medications on File Prior to Visit  ?Medication Dose Route Frequency Provider Last Rate Last Admin  ? 0.9 %  sodium chloride infusion  500 mL Intravenous Once Irene Shipper, MD      ? ? ?Past Medical History:  ?Diagnosis Date  ? Allergic rhinitis   ? Allergy   ?  Anxiety   ? Arthritis   ? Bronchitis   ? CAD (coronary artery disease)   ? 1/19 PCI/DES to [place] for ISR, normal EF.   ? Cancer [facility])   ? hx of precancerous cells in right breast   ? Cataract   ? Cervical dysplasia   ? unsure of procedure, possible "burning" in her late 26s  ? CHF (congestive heart failure) ([place])   ? Echo 06/2019: EF 55-60, elevated LVEDP, normal RV SF, mild MAC, mild MR, trivial TR  ? Complication of anesthesia   ? COPD (chronic obstructive pulmonary disease) ([place])   ? early   ? Depression   ? Dyspnea   ? with exertion   ? Family history of adverse reaction to anesthesia   ? daughter- problems wiht n/v  ? GERD (gastroesophageal reflux disease)   ? Gout   ? Headache(784.0)   ? Heart murmur   ? hx of years ago   ? Hyperlipidemia   ? Hypertension   ? Low back pain   ? Menopausal syndrome   ? Myocardial infarct [facility]) 2007  ? hx of  ? Overactive bladder   ? PONV (postoperative nausea and vomiting)   ? Sleep apnea   ? ?Allergies  ?Allergen Reactions  ? Erythromycin Rash  ?  But isn't certain  ? Sulfamethoxazole Rash  ? ? ?Social History  ? ?Socioeconomic History  ? Marital status: Married  ?  Spouse name: Not on file  ? Number of children: 2  ? Years of education: Not on file  ? Highest education level: GED or equivalent  ?Occupational History  ? Occupation:  retired  ? 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 Smoking status: Former  ?  Packs/day: 1.25  ?  Years: 52.00  ?  Pack years: 65.00  ?  Types: Cigarettes  ?  Quit date: 2018  ?  Years

## 2022-01-19 ENCOUNTER — Encounter: Payer: Self-pay | Admitting: Family Medicine

## 2022-01-19 ENCOUNTER — Ambulatory Visit (INDEPENDENT_AMBULATORY_CARE_PROVIDER_SITE_OTHER): Payer: Medicare Other | Admitting: Family Medicine

## 2022-01-19 VITALS — BP 128/80 | HR 100 | Resp 16 | Ht 65.0 in | Wt 201.1 lb

## 2022-01-19 DIAGNOSIS — S0003XD Contusion of scalp, subsequent encounter: Secondary | ICD-10-CM

## 2022-01-19 DIAGNOSIS — R42 Dizziness and giddiness: Secondary | ICD-10-CM | POA: Diagnosis not present

## 2022-01-19 DIAGNOSIS — I25118 Atherosclerotic heart disease of native coronary artery with other forms of angina pectoris: Secondary | ICD-10-CM

## 2022-01-19 DIAGNOSIS — R600 Localized edema: Secondary | ICD-10-CM

## 2022-01-19 DIAGNOSIS — I10 Essential (primary) hypertension: Secondary | ICD-10-CM

## 2022-01-19 DIAGNOSIS — R296 Repeated falls: Secondary | ICD-10-CM

## 2022-01-19 NOTE — Assessment & Plan Note (Signed)
Chronic. ?She has been evaluated by ENT and neurologist, clear etiology has not been identified. ?Brain MRI with mild chronic ischemic changes in cerebellum. ?Meclizine did not help. ?She felt like PT help. ?She is requesting referral to neuro for follow up, I do not think she needs another referral since she has already established, so she can call to arrange appt. ?

## 2022-01-19 NOTE — Assessment & Plan Note (Signed)
BP adequately controlled. ?Continue Metoprolol succinate 100 mg daily,Imdur 30 mg daily,and Losartan 100 mg daily. ?Monitor BP at home. ?Low salt diet to continue. ?

## 2022-01-19 NOTE — Patient Instructions (Addendum)
A few things to remember from today's visit: ? ? ?Dizziness ? ?Essential hypertension ? ?Frequent falls ? ?Hematoma of scalp, subsequent encounter ? ?Venous stasis dermatitis of left lower extremity ? ?If you need refills please call your pharmacy. ?Do not use My Chart to request refills or for acute issues that need immediate attention. ?  ?Use a walker instead a cane. ?Arrange appt with neuro for follow up. ?Monitor blood pressure at home. ?No changes in medications today. ?Take potassium when you take Lasix. ? ?Please be sure medication list is accurate. ?If a new problem present, please set up appointment sooner than planned today. ? ? ? ? ? ? ? ?

## 2022-01-19 NOTE — Assessment & Plan Note (Signed)
L>R. ?Continue Furosemide 40 mg daily prn. ?LE elevation above heart level and compression stocking will also help. ?Recommend taking KLOR 20 meq with she takes Furosemide. ?

## 2022-01-19 NOTE — Assessment & Plan Note (Addendum)
Some of her chronic medical conditions and medications can be contributing factors. Most falls proceeded by dizziness. ? A few falls with head and facial trauma, concerned about possible intracranial bleeding with future falls, she is on Plavix and aspirin. ?Fall precautions discussed. ?Recommend using her walker instead cane. ? ? ?

## 2022-01-27 ENCOUNTER — Other Ambulatory Visit: Payer: Self-pay | Admitting: Family Medicine

## 2022-01-27 DIAGNOSIS — K219 Gastro-esophageal reflux disease without esophagitis: Secondary | ICD-10-CM

## 2022-02-09 ENCOUNTER — Ambulatory Visit
Admission: RE | Admit: 2022-02-09 | Discharge: 2022-02-09 | Disposition: A | Discharge status: Home / Self Care | Payer: Medicare Other | Source: Ambulatory Visit | Attending: Family Medicine | Admitting: Family Medicine

## 2022-02-09 DIAGNOSIS — Z853 Personal history of malignant neoplasm of breast: Secondary | ICD-10-CM | POA: Diagnosis not present

## 2022-02-09 DIAGNOSIS — R921 Mammographic calcification found on diagnostic imaging of breast: Secondary | ICD-10-CM | POA: Diagnosis not present

## 2022-02-12 ENCOUNTER — Other Ambulatory Visit: Payer: Self-pay | Admitting: Physician Assistant

## 2022-02-22 NOTE — Progress Notes (Signed)
? ?ACUTE VISIT ?Chief Complaint  ?Patient presents with  ? possible diverticulitis  ?  Started with lower abdominal pain over the weekend; bad. Has been using gas-x.   ? ?HPI: ?Tracy [name] is a 76 y.o. female with hx of HTN,CAD,CHF,CKD,COPD,and anxiety here today with her husband complaining of abdominal pain as described above. ?Upper and lower abdominal pain, worse towards LLQ sometimes. ?Hx of abdominal pain,similar to symptoms she is having. ?It has been intermittent for a while, worse for the past 4-5 days. ?GI evaluation 05/13/21. ? ?Abdominal Pain ?This is a recurrent problem. The onset quality is gradual. The problem occurs intermittently. The problem has been waxing and waning. The pain is located in the generalized abdominal region. The pain is moderate. The quality of the pain is cramping. The abdominal pain does not radiate. Associated symptoms include arthralgias (chronic), constipation, flatus and nausea. Pertinent negatives include no anorexia, belching, diarrhea, dysuria, fever, frequency, headaches, hematochezia, hematuria, melena, vomiting or weight loss. She has tried nothing for the symptoms. Prior diagnostic workup includes GI consult and CT scan. Her past medical history is significant for abdominal surgery. There is no history of GERD.  ?Last bowel movement 3 days ago. ?Passing gas alleviates abdominal pain. ?Colonoscopy 09/2021. ?Abdominal CT 11/2020:  ?1. No acute intra-abdominal or pelvic pathology. ?2. Interval resection of the previously seen pelvic mass. No fluid collection or abscess. ?3. Severe sigmoid diverticulosis. No bowel obstruction. ?4. Aortic Atherosclerosis (ICD10-I70.0). ? ?Right shoulder pain,soreness, which has been going up for a few weeks, before her last fall in 01/2022. ?She has been applying abx oint and also tried triamcinolone cream,Vaseline,heat,and ice. No rash affecting right shoulder. ?No hx of trauma. ?Mild limitation of ROM< which aggravates pain. ?No  edema or erythema. ? ?Review of Systems  ?Constitutional:  Negative for fever and weight loss.  ?Respiratory:  Negative for cough, shortness of breath and wheezing.   ?Cardiovascular:  Negative for chest pain.  ?Gastrointestinal:  Positive for abdominal pain, constipation, flatus and nausea. Negative for anorexia, diarrhea, hematochezia, melena and vomiting.  ?Genitourinary:  Negative for dysuria, frequency and hematuria.  ?Musculoskeletal:  Positive for arthralgias (chronic) and back pain.  ?Neurological:  Negative for syncope and headaches.  ?Rest see pertinent positives and negatives per HPI. ? ?Current Outpatient Medications on File Prior to Visit  ?Medication Sig Dispense Refill  ? acetaminophen (TYLENOL) 500 MG tablet Take 500-1,000 mg by mouth every 6 (six) hours as needed for headache.     ? albuterol (VENTOLIN HFA) 108 (90 Base) MCG/ACT inhaler Inhale 2 puffs into the lungs every 4 (four) hours as needed for wheezing. (Patient taking differently: Inhale 2 puffs into the lungs as needed for wheezing.) 1 each 3  ? allopurinol (ZYLOPRIM) 100 MG tablet TAKE 1 TABLET BY MOUTH EVERY DAY 90 tablet 2  ? amLODipine (NORVASC) 5 MG tablet TAKE 1 TABLET (5 MG TOTAL) BY MOUTH DAILY. 90 tablet 3  ? APPLE CIDER VINEGAR PO Take 2,400 mg by mouth daily.    ? ASPERCREME LIDOCAINE EX Apply 1 application topically daily as needed (pain).    ? aspirin EC 81 MG tablet Take 81 mg by mouth daily.    ? cetirizine (ZYRTEC) 10 MG tablet Take 10 mg by mouth daily.    ? Cholecalciferol (VITAMIN D3) 50 MCG (2000 UT) TABS Take 4,000 Units by mouth daily.     ? clopidogrel (PLAVIX) 75 MG tablet TAKE 1 TABLET BY MOUTH DAILY WITH BREAKFAST. 90 tablet 1  ?  Cyanocobalamin (VITAMIN B-12) 5000 MCG SUBL Place 5,000 mcg under the tongue daily.     ? fluticasone (FLONASE) 50 MCG/ACT nasal spray SPRAY 2 SPRAYS INTO EACH NOSTRIL EVERY DAY (Patient taking differently: Place 2 sprays into both nostrils daily.) 48 mL 1  ? furosemide (LASIX) 40 MG  tablet TAKE 1 TABLET BY MOUTH EVERY DAY 90 tablet 0  ? guaiFENesin (MUCINEX) 600 MG 12 hr tablet Take 600 mg by mouth 2 (two) times daily as needed (congestion).    ? isosorbide mononitrate (IMDUR) 30 MG 24 hr tablet Take 1 tablet (30 mg total) by mouth daily. 90 tablet 3  ? losartan (COZAAR) 100 MG tablet TAKE 1 TABLET BY MOUTH EVERY DAY 90 tablet 3  ? metoprolol succinate (TOPROL-XL) 100 MG 24 hr tablet TAKE 1 TABLET BY MOUTH DAILY. TAKE WITH OR IMMEDIATELY FOLLOWING A MEAL. 90 tablet 3  ? Misc Natural Products (TART CHERRY ADVANCED) CAPS Take 1,000 capsules by mouth daily.     ? oxymetazoline (AFRIN) 0.05 % nasal spray Place 1 spray into both nostrils 2 (two) times daily as needed for congestion.    ? pantoprazole (PROTONIX) 40 MG tablet TAKE 1 TABLET BY MOUTH EVERY DAY 90 tablet 2  ? potassium chloride SA (KLOR-CON) 20 MEQ tablet Take 20 mEq by mouth daily.    ? Simethicone (GAS-X PO) Take 2 tablets by mouth daily as needed (gas).    ? triamcinolone cream (KENALOG) 0.1 % APPLY TO AFFECTED AREA TWICE A DAY 30 g 1  ? pravastatin (PRAVACHOL) 40 MG tablet Take 1 tablet (40 mg total) by mouth every evening. 90 tablet 3  ? [DISCONTINUED] nitroGLYCERIN (NITROSTAT) 0.4 MG SL tablet Place 1 tablet (0.4 mg total) under the tongue every 5 (five) minutes as needed for chest pain. 25 tablet 3  ? ?Current Facility-Administered Medications on File Prior to Visit  ?Medication Dose Route Frequency Provider Last Rate Last Admin  ? 0.9 %  sodium chloride infusion  500 mL Intravenous Once Irene Shipper, MD      ? ?Past Medical History:  ?Diagnosis Date  ? Allergic rhinitis   ? Allergy   ? Anxiety   ? Arthritis   ? Breast cancer ([place]) 04/28/2020  ? rigtht breast  ? Bronchitis   ? CAD (coronary artery disease)   ? 1/19 PCI/DES to [place] for ISR, normal EF.   ? Cancer [facility])   ? hx of precancerous cells in right breast   ? Cataract   ? Cervical dysplasia   ? unsure of procedure, possible "burning" in her late 34s  ? CHF (congestive heart  failure) ([place])   ? Echo 06/2019: EF 55-60, elevated LVEDP, normal RV SF, mild MAC, mild MR, trivial TR  ? Complication of anesthesia   ? COPD (chronic obstructive pulmonary disease) ([place])   ? early   ? Depression   ? Dyspnea   ? with exertion   ? Family history of adverse reaction to anesthesia   ? daughter- problems wiht n/v  ? GERD (gastroesophageal reflux disease)   ? Gout   ? Headache(784.0)   ? Heart murmur   ? hx of years ago   ? Hyperlipidemia   ? Hypertension   ? Low back pain   ? Menopausal syndrome   ? Myocardial infarct [facility]) 2007  ? hx of  ? Overactive bladder   ? PONV (postoperative nausea and vomiting)   ? Sleep apnea   ? ?Allergies  ?Allergen Reactions  ? Erythromycin  Rash  ?  But isn't certain  ? Sulfamethoxazole Rash  ? ?Social History  ? ?Socioeconomic History  ? Marital status: Married  ?  Spouse name: Not on file  ? Number of children: 2  ? Years of education: Not on file  ? Highest education level: GED or equivalent  ?Occupational History  ? Occupation: retired  ? 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 Smoking status: Former  ?  Packs/day: 1.25  ?  Years: 52.00  ?  Pack years: 65.00  ?  Types: Cigarettes  ?  Quit date: 2018  ?  Years since quitting: 5.3  ? Smokeless tobacco: Never  ? Tobacco comments:  ?  completely quit May of 2018; period of years she did not smoke   ?Vaping Use  ? Vaping Use: Never used  ?Substance and Sexual Activity  ? Alcohol use: Yes  ?  Comment: seldom  ? Drug use: No  ? Sexual activity: Not Currently  ?Other Topics Concern  ? Not on file  ?Social History Narrative  ? Not on file  ? ?Social Determinants of Health  ? ?Financial Resource Strain: Low Risk   ? Difficulty of Paying Living Expenses: Not very hard  ?Food Insecurity: No Food Insecurity  ? Worried About Charity fundraiser in the Last Year: Never true  ? Ran Out of Food in the Last Year: Never true  ?Transportation Needs: No Transportation Needs  ? Lack of Transportation (Medical): No  ?  Lack of Transportation (Non-Medical): No  ?Physical Activity: Insufficiently Active  ? Days of Exercise per Week: 1 day  ? Minutes of Exercise per Session: 20 min  ?Stress: Stress Concern Present  ? [place]

## 2022-02-23 ENCOUNTER — Other Ambulatory Visit: Payer: Self-pay | Admitting: Cardiovascular Disease

## 2022-02-23 ENCOUNTER — Encounter: Payer: Self-pay | Admitting: Family Medicine

## 2022-02-23 ENCOUNTER — Ambulatory Visit (INDEPENDENT_AMBULATORY_CARE_PROVIDER_SITE_OTHER): Payer: Medicare Other | Admitting: Family Medicine

## 2022-02-23 VITALS — BP 128/80 | HR 108 | Temp 98.5°F | Resp 16 | Ht 65.0 in | Wt 197.0 lb

## 2022-02-23 DIAGNOSIS — M25511 Pain in right shoulder: Secondary | ICD-10-CM | POA: Diagnosis not present

## 2022-02-23 DIAGNOSIS — I25118 Atherosclerotic heart disease of native coronary artery with other forms of angina pectoris: Secondary | ICD-10-CM | POA: Diagnosis not present

## 2022-02-23 DIAGNOSIS — G8929 Other chronic pain: Secondary | ICD-10-CM

## 2022-02-23 DIAGNOSIS — K5900 Constipation, unspecified: Secondary | ICD-10-CM | POA: Diagnosis not present

## 2022-02-23 DIAGNOSIS — R1084 Generalized abdominal pain: Secondary | ICD-10-CM | POA: Diagnosis not present

## 2022-02-23 LAB — CBC WITH DIFFERENTIAL/PLATELET
Basophils Absolute: 0 10*3/uL (ref 0.0–0.1)
Basophils Relative: 0.2 % (ref 0.0–3.0)
Eosinophils Absolute: 0.3 10*3/uL (ref 0.0–0.7)
Eosinophils Relative: 2.9 % (ref 0.0–5.0)
HCT: 40.9 % (ref 36.0–46.0)
Hemoglobin: 13.2 g/dL (ref 12.0–15.0)
Lymphocytes Relative: 34.7 % (ref 12.0–46.0)
Lymphs Abs: 3 10*3/uL (ref 0.7–4.0)
MCHC: 32.3 g/dL (ref 30.0–36.0)
MCV: 88.5 fl (ref 78.0–100.0)
Monocytes Absolute: 1 10*3/uL (ref 0.1–1.0)
Monocytes Relative: 12.3 % — ABNORMAL HIGH (ref 3.0–12.0)
Neutro Abs: 4.3 10*3/uL (ref 1.4–7.7)
Neutrophils Relative %: 49.9 % (ref 43.0–77.0)
Platelets: 255 10*3/uL (ref 150.0–400.0)
RBC: 4.62 Mil/uL (ref 3.87–5.11)
RDW: 15.6 % — ABNORMAL HIGH (ref 11.5–15.5)
WBC: 8.6 10*3/uL (ref 4.0–10.5)

## 2022-02-23 LAB — BASIC METABOLIC PANEL
BUN: 15 mg/dL (ref 6–23)
CO2: 28 mEq/L (ref 19–32)
Calcium: 9.6 mg/dL (ref 8.4–10.5)
Chloride: 102 mEq/L (ref 96–112)
Creatinine, Ser: 1.11 mg/dL (ref 0.40–1.20)
GFR: 49.18 mL/min — ABNORMAL LOW (ref >60.00)
Glucose, Bld: 130 mg/dL — ABNORMAL HIGH (ref 70–99)
Potassium: 3.6 mEq/L (ref 3.5–5.1)
Sodium: 138 mEq/L (ref 135–145)

## 2022-02-23 MED ORDER — DICLOFENAC SODIUM 1 % EX GEL: 4.0000 g | Freq: Four times a day (QID) | CUTANEOUS | 2 refills | Status: DC

## 2022-02-23 NOTE — Patient Instructions (Addendum)
A few things to remember from today's visit: ? ? ?Abdominal pain, generalized - Plan: CBC with Differential/Platelet, Basic metabolic panel ? ?Chronic right shoulder pain - Plan: Ambulatory referral to Physical Therapy ? ?Constipation, unspecified constipation type ? ?If you need refills please call your pharmacy. ?Do not use My Chart to request refills or for acute issues that need immediate attention. ?  ?Abdominal pain does not seem to be infectious. ?Constipation may be aggravating problem. ?Miralax daily at bedtime and Bisacodyl 5 mg daily for a few days then as needed. ?Increase water intake, adequate fiber. ? ?If fever of vomiting or cannot pass gas or worsening pain you need to go to the ED. ? ? ?Please be sure medication list is accurate. ?If a new problem present, please set up appointment sooner than planned today. ? ? ? ? ? ? ? ?

## 2022-02-24 ENCOUNTER — Telehealth: Payer: Self-pay | Admitting: *Deleted

## 2022-02-24 NOTE — Chronic Care Management (AMB) (Signed)
?  Care Management  ? ?Note ? ?02/24/2022 ?Name: KADANCE MCCUISTION MRN: [id number] DOB: 1946-03-14 ? ?GAILA ENGEBRETSEN is a 76 y.o. year old female who is a primary care patient of [name], Malka So, MD. I reached out to [name] by phone today offer care coordination services.  ? ?Ms. Leckey was given information about care management services today including:  ?Care management services include personalized support from designated clinical staff supervised by her physician, including individualized plan of care and coordination with other care providers ?24/7 contact phone numbers for assistance for urgent and routine care needs. ?The patient may stop care management services at any time by phone call to the office staff. ? ?Patient did not agree to enrollment in care management services and does not wish to consider at this time. ? ?Follow up plan: ?Patient declines engagement by the care management team.  We have been unable to make contact with the patient for follow up. The care management team is available to follow up with the patient after provider conversation with the patient regarding recommendation for care management engagement and subsequent re-referral to the care management team.  ? ?Laverda Sorenson  ?Care Guide, Embedded Care Coordination ?[place]  Care Management  ?Direct Dial: [phone] ? ?

## 2022-02-26 ENCOUNTER — Encounter: Payer: Self-pay | Admitting: Family Medicine

## 2022-03-09 ENCOUNTER — Encounter: Payer: Self-pay | Admitting: Rehabilitative and Restorative Service Providers"

## 2022-03-09 ENCOUNTER — Ambulatory Visit: Payer: Medicare Other | Attending: Family Medicine | Admitting: Rehabilitative and Restorative Service Providers"

## 2022-03-09 DIAGNOSIS — R296 Repeated falls: Secondary | ICD-10-CM | POA: Diagnosis not present

## 2022-03-09 DIAGNOSIS — M6281 Muscle weakness (generalized): Secondary | ICD-10-CM | POA: Diagnosis not present

## 2022-03-09 DIAGNOSIS — M25511 Pain in right shoulder: Secondary | ICD-10-CM | POA: Insufficient documentation

## 2022-03-09 DIAGNOSIS — G8929 Other chronic pain: Secondary | ICD-10-CM | POA: Diagnosis not present

## 2022-03-09 DIAGNOSIS — R2681 Unsteadiness on feet: Secondary | ICD-10-CM | POA: Diagnosis not present

## 2022-03-09 DIAGNOSIS — R42 Dizziness and giddiness: Secondary | ICD-10-CM | POA: Insufficient documentation

## 2022-03-09 DIAGNOSIS — R262 Difficulty in walking, not elsewhere classified: Secondary | ICD-10-CM

## 2022-03-09 NOTE — Therapy (Addendum)
OUTPATIENT PHYSICAL THERAPY SHOULDER EVALUATION   Patient Name: Tracy [name] MRN: [id number] DOB:1945/12/06, 76 y.o., female Today's Date: 03/09/2022   PT End of Session - 03/09/22 1623     Visit Number 1    Date for PT Re-Evaluation 04/29/22    Authorization Type Med A    Progress Note Due on Visit 10    PT Start Time 1615    PT Stop Time 1655    PT Time Calculation (min) 40 min    Activity Tolerance Patient tolerated treatment well    Behavior During Therapy [facility] for tasks assessed/performed             Past Medical History:  Diagnosis Date   Allergic rhinitis    Allergy    Anxiety    Arthritis    Breast cancer ([place]) 04/28/2020   rigtht breast   Bronchitis    CAD (coronary artery disease)    1/19 PCI/DES to [place] for ISR, normal EF.    Cancer ([place])    hx of precancerous cells in right breast    Cataract    Cervical dysplasia    unsure of procedure, possible "burning" in her late 45s   CHF (congestive heart failure) ([place])    Echo 06/2019: EF 55-60, elevated LVEDP, normal RV SF, mild MAC, mild MR, trivial TR   Complication of anesthesia    COPD (chronic obstructive pulmonary disease) (HCC)    early    Depression    Dyspnea    with exertion    Family history of adverse reaction to anesthesia    daughter- problems wiht n/v   GERD (gastroesophageal reflux disease)    Gout    Headache(784.0)    Heart murmur    hx of years ago    Hyperlipidemia    Hypertension    Low back pain    Menopausal syndrome    Myocardial infarct ([place]) 2007   hx of   Overactive bladder    PONV (postoperative nausea and vomiting)    Sleep apnea    Past Surgical History:  Procedure Laterality Date   ANGIOPLASTY     stent 2007   BREAST LUMPECTOMY WITH RADIOACTIVE SEED LOCALIZATION Right 04/28/2020   Procedure: RIGHT BREAST LUMPECTOMY X 2  WITH RADIOACTIVE SEED LOCALIZATION;  Surgeon: Donnie Mesa, MD;  Location: [facility];  Service: General;  Laterality:  Right;  LMA   CARDIAC CATHETERIZATION     COLONOSCOPY     CORONARY STENT INTERVENTION N/A 11/06/2017   Procedure: CORONARY STENT INTERVENTION;  Surgeon: [name], MD;  Location: [facility];  Service: Cardiovascular;  Laterality: N/A;   LEFT HEART CATH AND CORONARY ANGIOGRAPHY N/A 11/06/2017   Procedure: LEFT HEART CATH AND CORONARY ANGIOGRAPHY;  Surgeon: [name], MD;  Location: [place] [facility];  Service: Cardiovascular;  Laterality: N/A;   LEFT HEART CATH AND CORONARY ANGIOGRAPHY N/A 07/24/2019   Procedure: LEFT HEART CATH AND CORONARY ANGIOGRAPHY;  Surgeon: Burnell Blanks, MD;  Location: [facility];  Service: Cardiovascular;  Laterality: N/A;   RIGHT/LEFT HEART CATH AND CORONARY ANGIOGRAPHY N/A 09/29/2020   Procedure: RIGHT/LEFT HEART CATH AND CORONARY ANGIOGRAPHY;  Surgeon: Belva Crome, MD;  Location: [facility];  Service: Cardiovascular;  Laterality: N/A;   ROBOTIC ASSISTED BILATERAL SALPINGO OOPHERECTOMY Bilateral 11/10/2020   Procedure: XI ROBOTIC ASSISTED BILATERAL SALPINGO OOPHORECTOMY WITH MINI LAPAROTOMY FOR DRAINAGE;  Surgeon: Lafonda Mosses, MD;  Location: [facility];  Service: Gynecology;  Laterality: Bilateral;  MINI LAP FIRST   TONSILLECTOMY     VULVECTOMY N/A 11/10/2020   Procedure: WIDE EXCISION VULVECTOMY;  Surgeon: Lafonda Mosses, MD;  Location: [facility];  Service: Gynecology;  Laterality: N/A;   Patient Active Problem List   Diagnosis Date Noted   Dizziness 01/19/2022   Frequent falls 01/19/2022   Tremor of both hands 08/16/2021   Aortic atherosclerosis ([place]) 06/15/2021   Generalized osteoarthritis of multiple sites 12/25/2020   Chronic pain disorder 12/25/2020   Pelvic mass in female    Bilateral lower extremity edema 04/13/2020   Shortness of breath    Anxiety disorder 10/29/2018   CKD (chronic kidney disease), stage III ([place]) 06/27/2018   COPD (chronic obstructive pulmonary disease) ([place]) 05/25/2018   Obesity  03/20/2018   Dyspnea on exertion 11/06/2017   Abnormal stress test 11/06/2017   Chronic diastolic CHF (congestive heart failure) ([place]) 10/03/2017   Chronic combined systolic and diastolic heart failure ([place]) 09/04/2017   Left ventricular dysfunction 07/31/2017   Cataract, nuclear sclerotic senile, bilateral 02/01/2017   Gouty arthritis of toe of left foot 08/09/2016   Hyperuricemia 08/09/2016   Osteoarthritis (arthritis due to wear and tear of joints) 01/14/2014   Coronary artery disease 08/02/2011   Mitral regurgitation 08/02/2011   OVERACTIVE BLADDER 07/02/2010   ACUTE CYSTITIS 05/25/2010   Pure hypercholesterolemia 08/11/2009   PEDAL EDEMA 07/22/2009   GERD 04/11/2008   Essential hypertension 05/30/2007   MYOCARDIAL INFARCTION, HX OF 05/30/2007   Allergic rhinitis 05/30/2007   LOW BACK PAIN 05/30/2007    PCP: [name], [name], MD   REFERRING PROVIDER: [name], [name], MD   REFERRING DIAG: [phone] (ICD-10-CM) - Chronic right shoulder pain   THERAPY DIAG:  Chronic right shoulder pain - Plan: PT plan of care cert/re-cert  Muscle weakness (generalized) - Plan: PT plan of care cert/re-cert  Dizziness and giddiness - Plan: PT plan of care cert/re-cert  Unsteadiness on feet - Plan: PT plan of care cert/re-cert  Repeated falls - Plan: PT plan of care cert/re-cert  Difficulty in walking, not elsewhere classified - Plan: PT plan of care cert/re-cert  Rationale for Evaluation and Treatment Rehabilitation  ONSET DATE: 02/23/2022   SUBJECTIVE:                                                                                                                                                                                      SUBJECTIVE STATEMENT: Pt reports a history of shoulder pain for a couple of months, has tried ice and heat, but it has not improved.  Pt had been experiencing the shoulder pain intermittently at last admission into outpatient PT.  Pt states that she had a  fall on 01/16/22 where she reports that she got dizzy and fell over.  States that she has been better since recent discharge from PT for vestibular rehab, but she does have occasional dizzy spells.  Pt reports compliance with VOR exercises from past session.  Pt states that she has not had another dizzy episode like this since 01/16/22.  PERTINENT HISTORY: Dizziness, CVA, Hx of falls, OA  PAIN:  Are you having pain? Yes: NPRS scale: 6-7/10 Pain location: right shoulder Pain description: sore Aggravating factors: palpation and movement Relieving factors: Voltaren cream and cold pack  PRECAUTIONS: Fall  WEIGHT BEARING RESTRICTIONS No  FALLS:  Has patient fallen in last 6 months? Yes. Number of falls 1 fall on 01/16/22  LIVING ENVIRONMENT: Lives with: lives with their spouse Lives in: House/apartment Stairs: yes Has following equipment at home: Single point cane and Environmental consultant - 2 wheeled  OCCUPATION: Retired  PLOF: Independent and Leisure: spend time with family  PATIENT GOALS:  Pt would like to be able to move around better without pain.  OBJECTIVE:   DIAGNOSTIC FINDINGS:  01/16/2022 CT of cervical spine:  1. No acute fracture or traumatic subluxation of the cervical spine.  2. Stable 3 mm right upper lobe nodule. No follow-up needed if patient is low-risk.This recommendation follows the consensus statement: Guidelines for Management of Incidental Pulmonary Nodules Detected on CT Images: From the Fleischner Society 2017; Radiology 2017; 284:228-243.  PATIENT SURVEYS:  03/09/2022: FOTO 51% (projected 58% by visit 11)  COGNITION:  Overall cognitive status: Within functional limits for tasks assessed     SENSATION: Light touch: WFL  POSTURE: Forward head, rounded shoulders  UPPER EXTREMITY ROM:   Active ROM Right 03/09/2022 Left 03/09/2022  Shoulder flexion 115 137  Shoulder extension    Shoulder abduction 116 125  Shoulder adduction    Shoulder internal rotation     Shoulder external rotation    Elbow flexion    Elbow extension    Wrist flexion    Wrist extension    Wrist ulnar deviation    Wrist radial deviation    Wrist pronation    Wrist supination    (Blank rows = not tested)  UPPER EXTREMITY MMT:  MMT Right 03/09/2022 Left 03/09/2022  Shoulder flexion 4 5  Shoulder extension    Shoulder abduction 4 5  Shoulder adduction    Shoulder internal rotation 4 5  Shoulder external rotation 4 4  Middle trapezius    Lower trapezius    Elbow flexion    Elbow extension    Wrist flexion    Wrist extension    Wrist ulnar deviation    Wrist radial deviation    Wrist pronation    Wrist supination    Grip strength (lbs)    (Blank rows = not tested)  SHOULDER SPECIAL TESTS:  Impingement tests: Hawkins/Kennedy impingement test: positive right  Rotator cuff assessment: Empty can test: negative  JOINT MOBILITY TESTING:  Crepetis noted in right shoulder  PALPATION:  Tender to palpation along shoulder joint  FUNCTIONAL TESTING: 03/09/2022: 5 times sit to/from stand:  18.7 seconds with UE use TUG 19.3 sec with SPC   TODAY'S TREATMENT:  03/09/2022:  Seated:  shoulder ER and shoulder horizontal abduction with yellow tband x10 bilat Scapular retraction 2x10   PATIENT EDUCATION: Education details: Issued HEP and provided with yellow tband Person educated: Patient Education method: Explanation, Demonstration, and Handouts Education comprehension: verbalized understanding and returned demonstration  HOME EXERCISE PROGRAM: Access Code: [email or web address] URL: [email or web address] Date: 03/09/2022 Prepared by: [place] Dubin Timithy Arons  Exercises - Shoulder External Rotation and Scapular Retraction with Resistance  - 1-2 x daily - 7 x weekly - 2 sets - 10 reps - Standing Shoulder Horizontal Abduction with Resistance  - 1-2 x daily - 7 x weekly - 2 sets - 10 reps - Seated Scapular Retraction  - 1-2 x daily - 7 x weekly - 2 sets - 10  reps  ASSESSMENT:  CLINICAL IMPRESSION: Patient is a 76 y.o. female who was seen today for physical therapy evaluation and treatment for chronic right shoulder pain. Pts PLOF is able to ambulate with use of SPC and able to drive and visit with her daughters out of town.   OBJECTIVE IMPAIRMENTS decreased balance, difficulty walking, decreased ROM, decreased strength, dizziness, increased muscle spasms, impaired UE functional use, postural dysfunction, and pain.   ACTIVITY LIMITATIONS carrying and lifting  PARTICIPATION LIMITATIONS: cleaning and driving  PERSONAL FACTORS Time since onset of injury/illness/exacerbation and 3+ comorbidities: COPD, CHF, HTN, OA  are also affecting patient's functional outcome.   REHAB POTENTIAL: Good  CLINICAL DECISION MAKING: Evolving/moderate complexity  EVALUATION COMPLEXITY: Moderate   GOALS: Goals reviewed with patient? Yes  SHORT TERM GOALS: Target date: 03/30/2022    Pt will be independent with initial HEP. Baseline: Goal status: INITIAL  2.  Pt will report at least a 40% improvement in symptoms since starting PT. Baseline:  Goal status: INITIAL   LONG TERM GOALS: Target date: 05/04/2022    Pt will be independent with advanced HEP. Baseline:  Goal status: INITIAL  2.  Pt will increase FOTO to 58% to demonstrate her improvements in functional mobility. Baseline: 51% Goal status: INITIAL  3.  Pt will report being able to fasten/unfasten her bra without increased pain. Baseline:  Goal status: INITIAL  4.  Pt will decrease time on TUG to 14 seconds or less without assistive device and good safety to decrease risk of falling. Baseline: 19.3 sec with SPC Goal status: INITIAL  5.  Pt will increase right shoulder strength to at least 4+/5 to allow her to carry groceries without difficulty. Baseline:  Goal status: INITIAL  6.  Pt will state no dizziness with functional tasks, including reaching during grocery shopping for at least  the week prior to discharge. Baseline:  Goal status: INITIAL   PLAN: PT FREQUENCY: 2x/week  PT DURATION: 8 weeks  PLANNED INTERVENTIONS: Therapeutic exercises, Therapeutic activity, Neuromuscular re-education, Balance training, Gait training, Patient/Family education, Joint manipulation, Joint mobilization, Stair training, Vestibular training, Aquatic Therapy, Dry Needling, Electrical stimulation, Spinal manipulation, Spinal mobilization, Cryotherapy, Moist heat, Taping, Vasopneumatic device, Traction, Ultrasound, Ionotophoresis 82m/ml Dexamethasone, Manual therapy, and Re-evaluation  PLAN FOR NEXT SESSION: assess and progress HEP, strengthening, balance   SJuel Burrow PT 03/09/2022, 5:12 PM   BSarat[facility] LLC[address] SFlowoodGMaches[place] [address]Phone # [phone] [phone]

## 2022-03-15 ENCOUNTER — Other Ambulatory Visit: Payer: Self-pay | Admitting: Internal Medicine

## 2022-03-15 DIAGNOSIS — E785 Hyperlipidemia, unspecified: Secondary | ICD-10-CM

## 2022-03-16 ENCOUNTER — Ambulatory Visit: Payer: Medicare Other | Admitting: Rehabilitative and Restorative Service Providers"

## 2022-03-16 ENCOUNTER — Encounter: Payer: Self-pay | Admitting: Rehabilitative and Restorative Service Providers"

## 2022-03-16 DIAGNOSIS — R42 Dizziness and giddiness: Secondary | ICD-10-CM | POA: Diagnosis not present

## 2022-03-16 DIAGNOSIS — G8929 Other chronic pain: Secondary | ICD-10-CM | POA: Diagnosis not present

## 2022-03-16 DIAGNOSIS — M6281 Muscle weakness (generalized): Secondary | ICD-10-CM

## 2022-03-16 DIAGNOSIS — R262 Difficulty in walking, not elsewhere classified: Secondary | ICD-10-CM

## 2022-03-16 DIAGNOSIS — R296 Repeated falls: Secondary | ICD-10-CM

## 2022-03-16 DIAGNOSIS — R2681 Unsteadiness on feet: Secondary | ICD-10-CM | POA: Diagnosis not present

## 2022-03-16 DIAGNOSIS — M25511 Pain in right shoulder: Secondary | ICD-10-CM | POA: Diagnosis not present

## 2022-03-16 NOTE — Therapy (Signed)
OUTPATIENT PHYSICAL THERAPY TREATMENT NOTE   Patient Name: Tracy [name] MRN: [id number] DOB:1946-07-19, 76 y.o., female Today's Date: 03/16/2022  PCP: [name], [name], MD  REFERRING PROVIDER: [name], [name], MD   END OF SESSION:   PT End of Session - 03/16/22 1536     Visit Number 2    Date for PT Re-Evaluation 04/29/22    Authorization Type Med A    Progress Note Due on Visit 10    PT Start Time 9892    PT Stop Time 1610    PT Time Calculation (min) 40 min    Activity Tolerance Patient tolerated treatment well    Behavior During Therapy [facility] for tasks assessed/performed             Past Medical History:  Diagnosis Date   Allergic rhinitis    Allergy    Anxiety    Arthritis    Breast cancer ([place]) 04/28/2020   rigtht breast   Bronchitis    CAD (coronary artery disease)    1/19 PCI/DES to [place] for ISR, normal EF.    Cancer ([place])    hx of precancerous cells in right breast    Cataract    Cervical dysplasia    unsure of procedure, possible "burning" in her late 51s   CHF (congestive heart failure) ([place])    Echo 06/2019: EF 55-60, elevated LVEDP, normal RV SF, mild MAC, mild MR, trivial TR   Complication of anesthesia    COPD (chronic obstructive pulmonary disease) (HCC)    early    Depression    Dyspnea    with exertion    Family history of adverse reaction to anesthesia    daughter- problems wiht n/v   GERD (gastroesophageal reflux disease)    Gout    Headache(784.0)    Heart murmur    hx of years ago    Hyperlipidemia    Hypertension    Low back pain    Menopausal syndrome    Myocardial infarct ([place]) 2007   hx of   Overactive bladder    PONV (postoperative nausea and vomiting)    Sleep apnea    Past Surgical History:  Procedure Laterality Date   ANGIOPLASTY     stent 2007   BREAST LUMPECTOMY WITH RADIOACTIVE SEED LOCALIZATION Right 04/28/2020   Procedure: RIGHT BREAST LUMPECTOMY X 2  WITH RADIOACTIVE SEED LOCALIZATION;  Surgeon: Donnie Mesa, MD;  Location: [facility];  Service: General;  Laterality: Right;  LMA   CARDIAC CATHETERIZATION     COLONOSCOPY     CORONARY STENT INTERVENTION N/A 11/06/2017   Procedure: CORONARY STENT INTERVENTION;  Surgeon: [name], MD;  Location: [place] [facility];  Service: Cardiovascular;  Laterality: N/A;   LEFT HEART CATH AND CORONARY ANGIOGRAPHY N/A 11/06/2017   Procedure: LEFT HEART CATH AND CORONARY ANGIOGRAPHY;  Surgeon: [name], MD;  Location: [place] [facility];  Service: Cardiovascular;  Laterality: N/A;   LEFT HEART CATH AND CORONARY ANGIOGRAPHY N/A 07/24/2019   Procedure: LEFT HEART CATH AND CORONARY ANGIOGRAPHY;  Surgeon: Burnell Blanks, MD;  Location: [place] [facility];  Service: Cardiovascular;  Laterality: N/A;   RIGHT/LEFT HEART CATH AND CORONARY ANGIOGRAPHY N/A 09/29/2020   Procedure: RIGHT/LEFT HEART CATH AND CORONARY ANGIOGRAPHY;  Surgeon: Belva Crome, MD;  Location: [place] [facility];  Service: Cardiovascular;  Laterality: N/A;   ROBOTIC ASSISTED BILATERAL SALPINGO OOPHERECTOMY Bilateral 11/10/2020   Procedure: XI ROBOTIC ASSISTED BILATERAL SALPINGO OOPHORECTOMY  WITH MINI LAPAROTOMY FOR DRAINAGE;  Surgeon: Lafonda Mosses, MD;  [facility];  Service: Gynecology;  Laterality: Bilateral;  MINI LAP FIRST   TONSILLECTOMY     VULVECTOMY N/A 11/10/2020   Procedure: WIDE EXCISION VULVECTOMY;  Surgeon: Lafonda Mosses, MD;  [facility];  Service: Gynecology;  Laterality: N/A;   Patient Active Problem List   Diagnosis Date Noted   Dizziness 01/19/2022   Frequent falls 01/19/2022   Tremor of both hands 08/16/2021   Aortic atherosclerosis ([place]) 06/15/2021   Generalized osteoarthritis of multiple sites 12/25/2020   Chronic pain disorder 12/25/2020   Pelvic mass in female    Bilateral lower extremity edema 04/13/2020   Shortness of breath    Anxiety disorder 10/29/2018   CKD (chronic kidney disease), stage III ([place])  06/27/2018   COPD (chronic obstructive pulmonary disease) ([place]) 05/25/2018   Obesity 03/20/2018   Dyspnea on exertion 11/06/2017   Abnormal stress test 11/06/2017   Chronic diastolic CHF (congestive heart failure) ([place]) 10/03/2017   Chronic combined systolic and diastolic heart failure ([place]) 09/04/2017   Left ventricular dysfunction 07/31/2017   Cataract, nuclear sclerotic senile, bilateral 02/01/2017   Gouty arthritis of toe of left foot 08/09/2016   Hyperuricemia 08/09/2016   Osteoarthritis (arthritis due to wear and tear of joints) 01/14/2014   Coronary artery disease 08/02/2011   Mitral regurgitation 08/02/2011   OVERACTIVE BLADDER 07/02/2010   ACUTE CYSTITIS 05/25/2010   Pure hypercholesterolemia 08/11/2009   PEDAL EDEMA 07/22/2009   GERD 04/11/2008   Essential hypertension 05/30/2007   MYOCARDIAL INFARCTION, HX OF 05/30/2007   Allergic rhinitis 05/30/2007   LOW BACK PAIN 05/30/2007    REFERRING DIAG: M25.511,G89.29 (ICD-10-CM) - Chronic right shoulder pain   THERAPY DIAG:  Chronic right shoulder pain  Muscle weakness (generalized)  Dizziness and giddiness  Unsteadiness on feet  Repeated falls  Difficulty in walking, not elsewhere classified  Rationale for Evaluation and Treatment Rehabilitation  PERTINENT HISTORY: Dizziness, CVA, Hx of falls, OA  PRECAUTIONS: Fall Risk  SUBJECTIVE: Pt reports that she hit her shoulder on her car door, so she is having some additional pain.  PAIN:  Are you having pain? Yes: NPRS scale: 7/10 Pain location: right shoulder Pain description: burning Aggravating factors: use Relieving factors: rest  PATIENT GOALS:  Pt would like to be able to move around better without pain.  OBJECTIVE: (objective measures completed at initial evaluation unless otherwise dated)  DIAGNOSTIC FINDINGS:  01/16/2022 CT of cervical spine:  1. No acute fracture or traumatic subluxation of the cervical spine.  2. Stable 3 mm right upper lobe nodule.  No follow-up needed if patient is low-risk.This recommendation follows the consensus statement: Guidelines for Management of Incidental Pulmonary Nodules Detected on CT Images: From the Fleischner Society 2017; Radiology 2017; 284:228-243.   PATIENT SURVEYS:  03/09/2022: FOTO 51% (projected 58% by visit 11)   COGNITION:           Overall cognitive status: Within functional limits for tasks assessed                                  SENSATION: Light touch: WFL   POSTURE: Forward head, rounded shoulders   UPPER EXTREMITY ROM:    Active ROM Right 03/09/2022 Left 03/09/2022  Shoulder flexion 115 137  Shoulder extension      Shoulder abduction 116 125  Shoulder adduction      Shoulder internal  rotation      Shoulder external rotation      Elbow flexion      Elbow extension      Wrist flexion      Wrist extension      Wrist ulnar deviation      Wrist radial deviation      Wrist pronation      Wrist supination      (Blank rows = not tested)   UPPER EXTREMITY MMT:   MMT Right 03/09/2022 Left 03/09/2022  Shoulder flexion 4 5  Shoulder extension      Shoulder abduction 4 5  Shoulder adduction      Shoulder internal rotation 4 5  Shoulder external rotation 4 4  Middle trapezius      Lower trapezius      Elbow flexion      Elbow extension      Wrist flexion      Wrist extension      Wrist ulnar deviation      Wrist radial deviation      Wrist pronation      Wrist supination      Grip strength (lbs)      (Blank rows = not tested)   SHOULDER SPECIAL TESTS:            Impingement tests: Hawkins/Kennedy impingement test: positive right            Rotator cuff assessment: Empty can test: negative   JOINT MOBILITY TESTING:  Crepetis noted in right shoulder   PALPATION:  Tender to palpation along shoulder joint   FUNCTIONAL TESTING: 03/09/2022: 5 times sit to/from stand:  18.7 seconds with UE use TUG 19.3 sec with SPC             TODAY'S TREATMENT:    03/16/2022: Nustep level 5 x6 min with PT present to discuss status Seated with yellow theraband: shoulder ER, horizontal abduction, rows, shoulder extension, seated UE D2 PNF.  BUE 2x10 Wall push ups 2x10 Finger ladder for flexion and scaption x10 each RUE Shoulder flexion with cane with 1# 2x10  03/09/2022:  Seated:  shoulder ER and shoulder horizontal abduction with yellow tband x10 bilat Scapular retraction 2x10     PATIENT EDUCATION: Education details: Issued HEP and provided with yellow tband Person educated: Patient Education method: Explanation, Demonstration, and Handouts Education comprehension: verbalized understanding and returned demonstration     HOME EXERCISE PROGRAM: Access Code: [email or web address] URL: [email or web address] Date: 03/09/2022 Prepared by: Shelby Dubin Johnaton Sonneborn   Exercises - Shoulder External Rotation and Scapular Retraction with Resistance  - 1-2 x daily - 7 x weekly - 2 sets - 10 reps - Standing Shoulder Horizontal Abduction with Resistance  - 1-2 x daily - 7 x weekly - 2 sets - 10 reps - Seated Scapular Retraction  - 1-2 x daily - 7 x weekly - 2 sets - 10 reps   ASSESSMENT:   CLINICAL IMPRESSION: Ms Monsivais presents to skilled rehab with reports of compliance with HEP, but is having some increased shoulder pain secondary to hitting her shoulder on the car door.  Pt was able to tolerate therex with cuing for technique and body mechanics.  Pt with some difficulty with sequencing of D2 and requires increased tactile and verbal cuing. Pt requires seated recovery periods during standing exercises, like wall push ups and finger ladder. Pt continues to require skilled PT to progress towards goal related activities.     OBJECTIVE IMPAIRMENTS decreased balance, difficulty walking, decreased  ROM, decreased strength, dizziness, increased muscle spasms, impaired UE functional use, postural dysfunction, and pain.    ACTIVITY LIMITATIONS carrying and lifting    PARTICIPATION LIMITATIONS: cleaning and driving   PERSONAL FACTORS Time since onset of injury/illness/exacerbation and 3+ comorbidities: COPD, CHF, HTN, OA  are also affecting patient's functional outcome.    REHAB POTENTIAL: Good   CLINICAL DECISION MAKING: Evolving/moderate complexity   EVALUATION COMPLEXITY: Moderate     GOALS: Goals reviewed with patient? Yes   SHORT TERM GOALS: Target date: 03/30/2022     Pt will be independent with initial HEP. Baseline: Goal status: ONGOING   2.  Pt will report at least a 40% improvement in symptoms since starting PT. Baseline:  Goal status: INITIAL     LONG TERM GOALS: Target date: 05/04/2022     Pt will be independent with advanced HEP. Baseline:  Goal status: INITIAL   2.  Pt will increase FOTO to 58% to demonstrate her improvements in functional mobility. Baseline: 51% Goal status: INITIAL   3.  Pt will report being able to fasten/unfasten her bra without increased pain. Baseline:  Goal status: INITIAL   4.  Pt will decrease time on TUG to 14 seconds or less without assistive device and good safety to decrease risk of falling. Baseline: 19.3 sec with SPC Goal status: INITIAL   5.  Pt will increase right shoulder strength to at least 4+/5 to allow her to carry groceries without difficulty. Baseline:  Goal status: INITIAL   6.  Pt will state no dizziness with functional tasks, including reaching during grocery shopping for at least the week prior to discharge. Baseline:  Goal status: INITIAL     PLAN: PT FREQUENCY: 2x/week   PT DURATION: 8 weeks   PLANNED INTERVENTIONS: Therapeutic exercises, Therapeutic activity, Neuromuscular re-education, Balance training, Gait training, Patient/Family education, Joint manipulation, Joint mobilization, Stair training, Vestibular training, Aquatic Therapy, Dry Needling, Electrical stimulation, Spinal manipulation, Spinal mobilization, Cryotherapy, Moist heat, Taping, Vasopneumatic  device, Traction, Ultrasound, Ionotophoresis 49m/ml Dexamethasone, Manual therapy, and Re-evaluation   PLAN FOR NEXT SESSION: assess and progress HEP, strengthening, balance     SJuel Burrow PT 03/16/2022, 4:16 PM  BS[facility] SS[address] Lake[place]outh [place] [address]Phone # [phone] [phone]

## 2022-03-18 ENCOUNTER — Encounter: Payer: Self-pay | Admitting: Rehabilitative and Restorative Service Providers"

## 2022-03-18 ENCOUNTER — Ambulatory Visit: Payer: Medicare Other | Attending: Family Medicine | Admitting: Rehabilitative and Restorative Service Providers"

## 2022-03-18 DIAGNOSIS — G8929 Other chronic pain: Secondary | ICD-10-CM | POA: Diagnosis not present

## 2022-03-18 DIAGNOSIS — R2681 Unsteadiness on feet: Secondary | ICD-10-CM

## 2022-03-18 DIAGNOSIS — M25511 Pain in right shoulder: Secondary | ICD-10-CM | POA: Diagnosis not present

## 2022-03-18 DIAGNOSIS — R296 Repeated falls: Secondary | ICD-10-CM | POA: Diagnosis not present

## 2022-03-18 DIAGNOSIS — M6281 Muscle weakness (generalized): Secondary | ICD-10-CM

## 2022-03-18 DIAGNOSIS — R42 Dizziness and giddiness: Secondary | ICD-10-CM

## 2022-03-18 DIAGNOSIS — R262 Difficulty in walking, not elsewhere classified: Secondary | ICD-10-CM

## 2022-03-18 NOTE — Therapy (Signed)
OUTPATIENT PHYSICAL THERAPY TREATMENT NOTE   Patient Name: Tracy [name] MRN: [id number] DOB:09-19-1946, 76 y.o., female Today's Date: 03/18/2022  PCP: [name], [name], MD  REFERRING PROVIDER: [name], [name], MD   END OF SESSION:   PT End of Session - 03/18/22 0848     Visit Number 3    Date for PT Re-Evaluation 04/29/22    Authorization Type Med A    Progress Note Due on Visit 10    PT Start Time 0845    PT Stop Time 0925    PT Time Calculation (min) 40 min    Activity Tolerance Patient tolerated treatment well    Behavior During Therapy [facility] for tasks assessed/performed             Past Medical History:  Diagnosis Date   Allergic rhinitis    Allergy    Anxiety    Arthritis    Breast cancer ([place]) 04/28/2020   rigtht breast   Bronchitis    CAD (coronary artery disease)    1/19 PCI/DES to [place] for ISR, normal EF.    Cancer ([place])    hx of precancerous cells in right breast    Cataract    Cervical dysplasia    unsure of procedure, possible "burning" in her late 72s   CHF (congestive heart failure) ([place])    Echo 06/2019: EF 55-60, elevated LVEDP, normal RV SF, mild MAC, mild MR, trivial TR   Complication of anesthesia    COPD (chronic obstructive pulmonary disease) (HCC)    early    Depression    Dyspnea    with exertion    Family history of adverse reaction to anesthesia    daughter- problems wiht n/v   GERD (gastroesophageal reflux disease)    Gout    Headache(784.0)    Heart murmur    hx of years ago    Hyperlipidemia    Hypertension    Low back pain    Menopausal syndrome    Myocardial infarct ([place]) 2007   hx of   Overactive bladder    PONV (postoperative nausea and vomiting)    Sleep apnea    Past Surgical History:  Procedure Laterality Date   ANGIOPLASTY     stent 2007   BREAST LUMPECTOMY WITH RADIOACTIVE SEED LOCALIZATION Right 04/28/2020   Procedure: RIGHT BREAST LUMPECTOMY X 2  WITH RADIOACTIVE SEED LOCALIZATION;  Surgeon: Donnie Mesa, MD;  Location: [place];  Service: General;  Laterality: Right;  LMA   CARDIAC CATHETERIZATION     COLONOSCOPY     CORONARY STENT INTERVENTION N/A 11/06/2017   Procedure: CORONARY STENT INTERVENTION;  Surgeon: [name], MD;  Location: [place] [facility];  Service: Cardiovascular;  Laterality: N/A;   LEFT HEART CATH AND CORONARY ANGIOGRAPHY N/A 11/06/2017   Procedure: LEFT HEART CATH AND CORONARY ANGIOGRAPHY;  Surgeon: [name], MD;  Location: [place] [facility];  Service: Cardiovascular;  Laterality: N/A;   LEFT HEART CATH AND CORONARY ANGIOGRAPHY N/A 07/24/2019   Procedure: LEFT HEART CATH AND CORONARY ANGIOGRAPHY;  Surgeon: Burnell Blanks, MD;  Location: [place] [facility];  Service: Cardiovascular;  Laterality: N/A;   RIGHT/LEFT HEART CATH AND CORONARY ANGIOGRAPHY N/A 09/29/2020   Procedure: RIGHT/LEFT HEART CATH AND CORONARY ANGIOGRAPHY;  Surgeon: Belva Crome, MD;  Location: [place] [facility];  Service: Cardiovascular;  Laterality: N/A;   ROBOTIC ASSISTED BILATERAL SALPINGO OOPHERECTOMY Bilateral 11/10/2020   Procedure: XI ROBOTIC ASSISTED BILATERAL SALPINGO OOPHORECTOMY  WITH MINI LAPAROTOMY FOR DRAINAGE;  Surgeon: Lafonda Mosses, MD;  [facility];  Service: Gynecology;  Laterality: Bilateral;  MINI LAP FIRST   TONSILLECTOMY     VULVECTOMY N/A 11/10/2020   Procedure: WIDE EXCISION VULVECTOMY;  Surgeon: Lafonda Mosses, MD;  [facility];  Service: Gynecology;  Laterality: N/A;   Patient Active Problem List   Diagnosis Date Noted   Dizziness 01/19/2022   Frequent falls 01/19/2022   Tremor of both hands 08/16/2021   Aortic atherosclerosis ([place]) 06/15/2021   Generalized osteoarthritis of multiple sites 12/25/2020   Chronic pain disorder 12/25/2020   Pelvic mass in female    Bilateral lower extremity edema 04/13/2020   Shortness of breath    Anxiety disorder 10/29/2018   CKD (chronic kidney disease), stage III ([place])  06/27/2018   COPD (chronic obstructive pulmonary disease) ([place]) 05/25/2018   Obesity 03/20/2018   Dyspnea on exertion 11/06/2017   Abnormal stress test 11/06/2017   Chronic diastolic CHF (congestive heart failure) ([place]) 10/03/2017   Chronic combined systolic and diastolic heart failure ([place]) 09/04/2017   Left ventricular dysfunction 07/31/2017   Cataract, nuclear sclerotic senile, bilateral 02/01/2017   Gouty arthritis of toe of left foot 08/09/2016   Hyperuricemia 08/09/2016   Osteoarthritis (arthritis due to wear and tear of joints) 01/14/2014   Coronary artery disease 08/02/2011   Mitral regurgitation 08/02/2011   OVERACTIVE BLADDER 07/02/2010   ACUTE CYSTITIS 05/25/2010   Pure hypercholesterolemia 08/11/2009   PEDAL EDEMA 07/22/2009   GERD 04/11/2008   Essential hypertension 05/30/2007   MYOCARDIAL INFARCTION, HX OF 05/30/2007   Allergic rhinitis 05/30/2007   LOW BACK PAIN 05/30/2007    REFERRING DIAG: M25.511,G89.29 (ICD-10-CM) - Chronic right shoulder pain   THERAPY DIAG:  Chronic right shoulder pain  Muscle weakness (generalized)  Dizziness and giddiness  Unsteadiness on feet  Repeated falls  Difficulty in walking, not elsewhere classified  Rationale for Evaluation and Treatment Rehabilitation  PERTINENT HISTORY: Dizziness, CVA, Hx of falls, OA  PRECAUTIONS: Fall Risk  SUBJECTIVE: Pt denies any dizziness currently, states that she is still having some shoulder pain.  PAIN:  Are you having pain? Yes: NPRS scale: 6/10 Pain location: right shoulder Pain description: burning Aggravating factors: use Relieving factors: rest  PATIENT GOALS:  Pt would like to be able to move around better without pain.  OBJECTIVE: (objective measures completed at initial evaluation unless otherwise dated)  DIAGNOSTIC FINDINGS:  01/16/2022 CT of cervical spine:  1. No acute fracture or traumatic subluxation of the cervical spine.  2. Stable 3 mm right upper lobe nodule. No  follow-up needed if patient is low-risk.This recommendation follows the consensus statement: Guidelines for Management of Incidental Pulmonary Nodules Detected on CT Images: From the Fleischner Society 2017; Radiology 2017; 284:228-243.   PATIENT SURVEYS:  03/09/2022: FOTO 51% (projected 58% by visit 11)   COGNITION:           Overall cognitive status: Within functional limits for tasks assessed                                  SENSATION: Light touch: WFL   POSTURE: Forward head, rounded shoulders   UPPER EXTREMITY ROM:    Active ROM Right 03/09/2022 Left 03/09/2022  Shoulder flexion 115 137  Shoulder extension      Shoulder abduction 116 125  Shoulder adduction      Shoulder internal rotation  Shoulder external rotation      Elbow flexion      Elbow extension      Wrist flexion      Wrist extension      Wrist ulnar deviation      Wrist radial deviation      Wrist pronation      Wrist supination      (Blank rows = not tested)   UPPER EXTREMITY MMT:   MMT Right 03/09/2022 Left 03/09/2022  Shoulder flexion 4 5  Shoulder extension      Shoulder abduction 4 5  Shoulder adduction      Shoulder internal rotation 4 5  Shoulder external rotation 4 4  Middle trapezius      Lower trapezius      Elbow flexion      Elbow extension      Wrist flexion      Wrist extension      Wrist ulnar deviation      Wrist radial deviation      Wrist pronation      Wrist supination      Grip strength (lbs)      (Blank rows = not tested)   SHOULDER SPECIAL TESTS:            Impingement tests: Hawkins/Kennedy impingement test: positive right            Rotator cuff assessment: Empty can test: negative   JOINT MOBILITY TESTING:  Crepetis noted in right shoulder   PALPATION:  Tender to palpation along shoulder joint   FUNCTIONAL TESTING: 03/09/2022: 5 times sit to/from stand:  18.7 seconds with UE use TUG 19.3 sec with SPC             TODAY'S TREATMENT:   03/18/2022: Seated  with yellow theraband: shoulder ER, horizontal abduction, rows, shoulder extension, seated UE D2 PNF.  BUE 2x10 Nustep level 5 x6 min with PT present to discuss status Shoulder flexion with cane with 1# 2x10 Theraputty grip strengthening with yellow putty x1 min bilat Wall wash for shoulder flexion x10 reps  03/16/2022: Nustep level 5 x6 min with PT present to discuss status Seated with yellow theraband: shoulder ER, horizontal abduction, rows, shoulder extension, seated UE D2 PNF.  BUE 2x10 Wall push ups 2x10 Finger ladder for flexion and scaption x10 each RUE Shoulder flexion with cane with 1# 2x10  03/09/2022:  Seated:  shoulder ER and shoulder horizontal abduction with yellow tband x10 bilat Scapular retraction 2x10     PATIENT EDUCATION: Education details: Issued HEP and provided with yellow tband Person educated: Patient Education method: Explanation, Demonstration, and Handouts Education comprehension: verbalized understanding and returned demonstration     HOME EXERCISE PROGRAM: Access Code: [email or web address] URL: [email or web address] Date: 03/09/2022 Prepared by: Shelby Dubin Tymira Horkey   Exercises - Shoulder External Rotation and Scapular Retraction with Resistance  - 1-2 x daily - 7 x weekly - 2 sets - 10 reps - Standing Shoulder Horizontal Abduction with Resistance  - 1-2 x daily - 7 x weekly - 2 sets - 10 reps - Seated Scapular Retraction  - 1-2 x daily - 7 x weekly - 2 sets - 10 reps   ASSESSMENT:   CLINICAL IMPRESSION: Ms Ciano presents to skilled rehab with reports of compliance with HEP and feeling better than last visit.  Added putty grip strength to assist with pt complaints of finger/grip weakness.  Pt tolerated session well and only required min/mod cuing for posture throughout.  Pt continues to  require skilled PT to progress towards goal related activities.     OBJECTIVE IMPAIRMENTS decreased balance, difficulty walking, decreased ROM, decreased strength,  dizziness, increased muscle spasms, impaired UE functional use, postural dysfunction, and pain.    ACTIVITY LIMITATIONS carrying and lifting   PARTICIPATION LIMITATIONS: cleaning and driving   PERSONAL FACTORS Time since onset of injury/illness/exacerbation and 3+ comorbidities: COPD, CHF, HTN, OA  are also affecting patient's functional outcome.    REHAB POTENTIAL: Good   CLINICAL DECISION MAKING: Evolving/moderate complexity   EVALUATION COMPLEXITY: Moderate     GOALS: Goals reviewed with patient? Yes   SHORT TERM GOALS: Target date: 03/30/2022     Pt will be independent with initial HEP. Baseline: Goal status: ONGOING   2.  Pt will report at least a 40% improvement in symptoms since starting PT. Baseline:  Goal status: INITIAL     LONG TERM GOALS: Target date: 05/04/2022     Pt will be independent with advanced HEP. Baseline:  Goal status: INITIAL   2.  Pt will increase FOTO to 58% to demonstrate her improvements in functional mobility. Baseline: 51% Goal status: INITIAL   3.  Pt will report being able to fasten/unfasten her bra without increased pain. Baseline:  Goal status: INITIAL   4.  Pt will decrease time on TUG to 14 seconds or less without assistive device and good safety to decrease risk of falling. Baseline: 19.3 sec with SPC Goal status: INITIAL   5.  Pt will increase right shoulder strength to at least 4+/5 to allow her to carry groceries without difficulty. Baseline:  Goal status: INITIAL   6.  Pt will state no dizziness with functional tasks, including reaching during grocery shopping for at least the week prior to discharge. Baseline:  Goal status: INITIAL     PLAN: PT FREQUENCY: 2x/week   PT DURATION: 8 weeks   PLANNED INTERVENTIONS: Therapeutic exercises, Therapeutic activity, Neuromuscular re-education, Balance training, Gait training, Patient/Family education, Joint manipulation, Joint mobilization, Stair training, Vestibular training,  Aquatic Therapy, Dry Needling, Electrical stimulation, Spinal manipulation, Spinal mobilization, Cryotherapy, Moist heat, Taping, Vasopneumatic device, Traction, Ultrasound, Ionotophoresis 4m/ml Dexamethasone, Manual therapy, and Re-evaluation   PLAN FOR NEXT SESSION: assess and progress HEP, strengthening, balance     SJuel Burrow PT 03/18/2022, 9:31 AM  [facility] [place] [address]Phone # [phone] [phone]

## 2022-03-24 ENCOUNTER — Encounter: Payer: Self-pay | Admitting: Rehabilitative and Restorative Service Providers"

## 2022-03-24 ENCOUNTER — Ambulatory Visit: Payer: Medicare Other | Admitting: Rehabilitative and Restorative Service Providers"

## 2022-03-24 DIAGNOSIS — M6281 Muscle weakness (generalized): Secondary | ICD-10-CM | POA: Diagnosis not present

## 2022-03-24 DIAGNOSIS — M25511 Pain in right shoulder: Secondary | ICD-10-CM | POA: Diagnosis not present

## 2022-03-24 DIAGNOSIS — G8929 Other chronic pain: Secondary | ICD-10-CM

## 2022-03-24 DIAGNOSIS — R42 Dizziness and giddiness: Secondary | ICD-10-CM

## 2022-03-24 DIAGNOSIS — R2681 Unsteadiness on feet: Secondary | ICD-10-CM | POA: Diagnosis not present

## 2022-03-24 DIAGNOSIS — R296 Repeated falls: Secondary | ICD-10-CM | POA: Diagnosis not present

## 2022-03-24 DIAGNOSIS — R262 Difficulty in walking, not elsewhere classified: Secondary | ICD-10-CM

## 2022-03-24 NOTE — Therapy (Signed)
OUTPATIENT PHYSICAL THERAPY TREATMENT NOTE   Patient Name: Tracy [name] MRN: [id number] DOB:[birth year], 76 y.o., female Today's Date: 03/24/2022  PCP: [name], [name], MD  REFERRING PROVIDER: [name], [name], MD   END OF SESSION:   PT End of Session - 03/24/22 0853     Visit Number 4    Date for PT Re-Evaluation 04/29/22    Authorization Type Med A- KX modifier needed    Progress Note Due on Visit 10    PT Start Time 0843    PT Stop Time 0925    PT Time Calculation (min) 42 min    Activity Tolerance Patient tolerated treatment well    Behavior During Therapy [facility] for tasks assessed/performed             Past Medical History:  Diagnosis Date   Allergic rhinitis    Allergy    Anxiety    Arthritis    Breast cancer ([place]) 04/28/2020   rigtht breast   Bronchitis    CAD (coronary artery disease)    1/19 PCI/DES to [place] for ISR, normal EF.    Cancer ([place])    hx of precancerous cells in right breast    Cataract    Cervical dysplasia    unsure of procedure, possible "burning" in her late 50s   CHF (congestive heart failure) ([place])    Echo 06/2019: EF 55-60, elevated LVEDP, normal RV SF, mild MAC, mild MR, trivial TR   Complication of anesthesia    COPD (chronic obstructive pulmonary disease) (H[facility])    early    Depression    Dyspnea    with exertion    Family history of adverse reaction to anesthesia    daughter- problems wiht n/v   GERD (gastroesophageal reflux disease)    Gout    Headache(784.0)    Heart murmur    hx of years ago    Hyperlipidemia    Hypertension    Low back pain    Menopausal syndrome    Myocardial infarct ([place]) 2007   hx of   Overactive bladder    PONV (postoperative nausea and vomiting)    Sleep apnea    Past Surgical History:  Procedure Laterality Date   ANGIOPLASTY     stent 2007   BREAST LUMPECTOMY WITH RADIOACTIVE SEED LOCALIZATION Right 04/28/2020   Procedure: RIGHT BREAST LUMPECTOMY X 2  WITH RADIOACTIVE SEED LOCALIZATION;   Surgeon: Donnie Mesa, MD;  Location: [facility];  Service: General;  Laterality: Right;  LMA   CARDIAC CATHETERIZATION     COLONOSCOPY     CORONARY STENT INTERVENTION N/A 11/06/2017   Procedure: CORONARY STENT INTERVENTION;  Surgeon: [name], MD;  Location: [facility];  Service: Cardiovascular;  Laterality: N/A;   LEFT HEART CATH AND CORONARY ANGIOGRAPHY N/A 11/06/2017   Procedure: LEFT HEART CATH AND CORONARY ANGIOGRAPHY;  Surgeon: [name], MD;  Location: [facility];  Service: Cardiovascular;  Laterality: N/A;   LEFT HEART CATH AND CORONARY ANGIOGRAPHY N/A 07/24/2019   Procedure: LEFT HEART CATH AND CORONARY ANGIOGRAPHY;  Surgeon: Burnell Blanks, MD;  Location: [facility];  Service: Cardiovascular;  Laterality: N/A;   RIGHT/LEFT HEART CATH AND CORONARY ANGIOGRAPHY N/A 09/29/2020   Procedure: RIGHT/LEFT HEART CATH AND CORONARY ANGIOGRAPHY;  Surgeon: Belva Crome, MD;  Location: [facility];  Service: Cardiovascular;  Laterality: N/A;   ROBOTIC ASSISTED BILATERAL SALPINGO OOPHERECTOMY Bilateral 11/10/2020   Procedure: XI ROBOTIC ASSISTED  BILATERAL SALPINGO OOPHORECTOMY WITH MINI LAPAROTOMY FOR DRAINAGE;  Surgeon: Lafonda Mosses, MD;  Location: [facility];  Service: Gynecology;  Laterality: Bilateral;  MINI LAP FIRST   TONSILLECTOMY     VULVECTOMY N/A 11/10/2020   Procedure: WIDE EXCISION VULVECTOMY;  Surgeon: Lafonda Mosses, MD;  Location: [facility];  Service: Gynecology;  Laterality: N/A;   Patient Active Problem List   Diagnosis Date Noted   Dizziness 01/19/2022   Frequent falls 01/19/2022   Tremor of both hands 08/16/2021   Aortic atherosclerosis ([place]) 06/15/2021   Generalized osteoarthritis of multiple sites 12/25/2020   Chronic pain disorder 12/25/2020   Pelvic mass in female    Bilateral lower extremity edema 04/13/2020   Shortness of breath    Anxiety disorder 10/29/2018   CKD (chronic kidney disease),  stage III ([place]) 06/27/2018   COPD (chronic obstructive pulmonary disease) ([place]) 05/25/2018   Obesity 03/20/2018   Dyspnea on exertion 11/06/2017   Abnormal stress test 11/06/2017   Chronic diastolic CHF (congestive heart failure) ([place]) 10/03/2017   Chronic combined systolic and diastolic heart failure ([place]) 09/04/2017   Left ventricular dysfunction 07/31/2017   Cataract, nuclear sclerotic senile, bilateral 02/01/2017   Gouty arthritis of toe of left foot 08/09/2016   Hyperuricemia 08/09/2016   Osteoarthritis (arthritis due to wear and tear of joints) 01/14/2014   Coronary artery disease 08/02/2011   Mitral regurgitation 08/02/2011   OVERACTIVE BLADDER 07/02/2010   ACUTE CYSTITIS 05/25/2010   Pure hypercholesterolemia 08/11/2009   PEDAL EDEMA 07/22/2009   GERD 04/11/2008   Essential hypertension 05/30/2007   MYOCARDIAL INFARCTION, HX OF 05/30/2007   Allergic rhinitis 05/30/2007   LOW BACK PAIN 05/30/2007    REFERRING DIAG: M25.511,G89.29 (ICD-10-CM) - Chronic right shoulder pain   THERAPY DIAG:  Chronic right shoulder pain  Muscle weakness (generalized)  Dizziness and giddiness  Unsteadiness on feet  Repeated falls  Difficulty in walking, not elsewhere classified  Rationale for Evaluation and Treatment Rehabilitation  PERTINENT HISTORY: Dizziness, CVA, Hx of falls, OA  PRECAUTIONS: Fall Risk  SUBJECTIVE: Pt denies any dizziness currently, states that she is still having some shoulder pain.  PAIN:  Are you having pain? Yes: NPRS scale: 6/10 Pain location: right shoulder Pain description: burning Aggravating factors: use Relieving factors: rest  PATIENT GOALS:  Pt would like to be able to move around better without pain.  OBJECTIVE: (objective measures completed at initial evaluation unless otherwise dated)  DIAGNOSTIC FINDINGS:  01/16/2022 CT of cervical spine:  1. No acute fracture or traumatic subluxation of the cervical spine.  2. Stable 3 mm right upper  lobe nodule. No follow-up needed if patient is low-risk.This recommendation follows the consensus statement: Guidelines for Management of Incidental Pulmonary Nodules Detected on CT Images: From the Fleischner Society 2017; Radiology 2017; 284:228-243.   PATIENT SURVEYS:  03/09/2022: FOTO 51% (projected 58% by visit 11)   COGNITION:           Overall cognitive status: Within functional limits for tasks assessed                                  SENSATION: Light touch: WFL   POSTURE: Forward head, rounded shoulders   UPPER EXTREMITY ROM:    Active ROM Right 03/09/2022 Left 03/09/2022  Shoulder flexion 115 137  Shoulder extension      Shoulder abduction 116 125  Shoulder adduction      Shoulder internal rotation  Shoulder external rotation      Elbow flexion      Elbow extension      Wrist flexion      Wrist extension      Wrist ulnar deviation      Wrist radial deviation      Wrist pronation      Wrist supination      (Blank rows = not tested)   UPPER EXTREMITY MMT:   MMT Right 03/09/2022 Left 03/09/2022  Shoulder flexion 4 5  Shoulder extension      Shoulder abduction 4 5  Shoulder adduction      Shoulder internal rotation 4 5  Shoulder external rotation 4 4  Middle trapezius      Lower trapezius      Elbow flexion      Elbow extension      Wrist flexion      Wrist extension      Wrist ulnar deviation      Wrist radial deviation      Wrist pronation      Wrist supination      Grip strength (lbs)      (Blank rows = not tested)   SHOULDER SPECIAL TESTS:            Impingement tests: Hawkins/Kennedy impingement test: positive right            Rotator cuff assessment: Empty can test: negative   JOINT MOBILITY TESTING:  Crepetis noted in right shoulder   PALPATION:  Tender to palpation along shoulder joint   FUNCTIONAL TESTING: 03/09/2022: 5 times sit to/from stand:  18.7 seconds with UE use TUG 19.3 sec with SPC             TODAY'S TREATMENT:    03/24/2022: Nustep level 5 x6 min with PT present to discuss status Shoulder flexion with cane with 1# 2x10 Seated with yellow theraband: shoulder ER, horizontal abduction, rows, shoulder extension, seated UE D2 PNF.  BUE 2x10 Wall wash for shoulder flexion and scaption x10 reps each Scapular stabilization with small green ball on wall with x20 CW and x20 CCW circles Wall push ups 2x10  03/18/2022: Seated with yellow theraband: shoulder ER, horizontal abduction, rows, shoulder extension, seated UE D2 PNF.  BUE 2x10 Nustep level 5 x6 min with PT present to discuss status Shoulder flexion with cane with 1# 2x10 Theraputty grip strengthening with yellow putty x1 min bilat Wall wash for shoulder flexion x10 reps  03/16/2022: Nustep level 5 x6 min with PT present to discuss status Seated with yellow theraband: shoulder ER, horizontal abduction, rows, shoulder extension, seated UE D2 PNF.  BUE 2x10 Wall push ups 2x10 Finger ladder for flexion and scaption x10 each RUE Shoulder flexion with cane with 1# 2x10     PATIENT EDUCATION: Education details: Issued HEP and provided with yellow tband Person educated: Patient Education method: Explanation, Demonstration, and Handouts Education comprehension: verbalized understanding and returned demonstration     HOME EXERCISE PROGRAM: Access Code: [email or web address] URL: [email or web address] Date: 03/09/2022 Prepared by: Shelby [place] Lafond   Exercises - Shoulder External Rotation and Scapular Retraction with Resistance  - 1-2 x daily - 7 x weekly - 2 sets - 10 reps - Standing Shoulder Horizontal Abduction with Resistance  - 1-2 x daily - 7 x weekly - 2 sets - 10 reps - Seated Scapular Retraction  - 1-2 x daily - 7 x weekly - 2 sets - 10 reps   ASSESSMENT:   CLINICAL IMPRESSION:  Ms Krone presents to skilled rehab with reports of having some increased pain yesterday when the rainy weather came through.  Pt states that overall, she feels  about 35-45% better since starting PT.  Pt was able to progress to rows and shoulder extension in standing, all other theraband exercises in sitting.  Pt does require some seated recovery breaks during standing exercises secondary to knee pain.  Pt continues to require skilled PT to progress towards goal related activities.     OBJECTIVE IMPAIRMENTS decreased balance, difficulty walking, decreased ROM, decreased strength, dizziness, increased muscle spasms, impaired UE functional use, postural dysfunction, and pain.    ACTIVITY LIMITATIONS carrying and lifting   PARTICIPATION LIMITATIONS: cleaning and driving   PERSONAL FACTORS Time since onset of injury/illness/exacerbation and 3+ comorbidities: COPD, CHF, HTN, OA  are also affecting patient's functional outcome.    REHAB POTENTIAL: Good   CLINICAL DECISION MAKING: Evolving/moderate complexity   EVALUATION COMPLEXITY: Moderate     GOALS: Goals reviewed with patient? Yes   SHORT TERM GOALS: Target date: 03/30/2022     Pt will be independent with initial HEP. Baseline: Goal status: ONGOING   2.  Pt will report at least a 40% improvement in symptoms since starting PT. Baseline:  Goal status: Ongoing (35-45% better reported on 03/24/22)     LONG TERM GOALS: Target date: 05/04/2022     Pt will be independent with advanced HEP. Baseline:  Goal status: INITIAL   2.  Pt will increase FOTO to 58% to demonstrate her improvements in functional mobility. Baseline: 51% Goal status: INITIAL   3.  Pt will report being able to fasten/unfasten her bra without increased pain. Baseline:  Goal status: INITIAL   4.  Pt will decrease time on TUG to 14 seconds or less without assistive device and good safety to decrease risk of falling. Baseline: 19.3 sec with SPC Goal status: INITIAL   5.  Pt will increase right shoulder strength to at least 4+/5 to allow her to carry groceries without difficulty. Baseline:  Goal status: INITIAL   6.  Pt  will state no dizziness with functional tasks, including reaching during grocery shopping for at least the week prior to discharge. Baseline:  Goal status: INITIAL     PLAN: PT FREQUENCY: 2x/week   PT DURATION: 8 weeks   PLANNED INTERVENTIONS: Therapeutic exercises, Therapeutic activity, Neuromuscular re-education, Balance training, Gait training, Patient/Family education, Joint manipulation, Joint mobilization, Stair training, Vestibular training, Aquatic Therapy, Dry Needling, Electrical stimulation, Spinal manipulation, Spinal mobilization, Cryotherapy, Moist heat, Taping, Vasopneumatic device, Traction, Ultrasound, Ionotophoresis 22m/ml Dexamethasone, Manual therapy, and Re-evaluation   PLAN FOR NEXT SESSION: assess and progress HEP, strengthening, balance     SJuel Burrow PT 03/24/2022, 9:34 AM  [place] [address] [place] [id number] # [phone] [phone]

## 2022-03-25 ENCOUNTER — Ambulatory Visit: Payer: Medicare Other | Admitting: Rehabilitative and Restorative Service Providers"

## 2022-03-25 ENCOUNTER — Encounter: Payer: Self-pay | Admitting: Rehabilitative and Restorative Service Providers"

## 2022-03-25 DIAGNOSIS — G8929 Other chronic pain: Secondary | ICD-10-CM

## 2022-03-25 DIAGNOSIS — R2681 Unsteadiness on feet: Secondary | ICD-10-CM | POA: Diagnosis not present

## 2022-03-25 DIAGNOSIS — R296 Repeated falls: Secondary | ICD-10-CM

## 2022-03-25 DIAGNOSIS — R262 Difficulty in walking, not elsewhere classified: Secondary | ICD-10-CM

## 2022-03-25 DIAGNOSIS — M6281 Muscle weakness (generalized): Secondary | ICD-10-CM | POA: Diagnosis not present

## 2022-03-25 DIAGNOSIS — R42 Dizziness and giddiness: Secondary | ICD-10-CM | POA: Diagnosis not present

## 2022-03-25 DIAGNOSIS — M25511 Pain in right shoulder: Secondary | ICD-10-CM | POA: Diagnosis not present

## 2022-03-25 NOTE — Therapy (Signed)
OUTPATIENT PHYSICAL THERAPY TREATMENT NOTE   Patient Name: Tracy [name] MRN: [id number] DOB:[birth year], 76 y.o., female Today's Date: 03/25/2022  PCP: [name], [name], MD  REFERRING PROVIDER: [name], [name], MD   END OF SESSION:   PT End of Session - 03/25/22 0837     Visit Number 5    Date for PT Re-Evaluation 04/29/22    Authorization Type Med A- KX modifier needed    Progress Note Due on Visit 10    PT Start Time 0830    PT Stop Time 0910    PT Time Calculation (min) 40 min    Activity Tolerance Patient tolerated treatment well    Behavior During Therapy [facility] for tasks assessed/performed             Past Medical History:  Diagnosis Date   Allergic rhinitis    Allergy    Anxiety    Arthritis    Breast cancer ([place]) 04/28/2020   rigtht breast   Bronchitis    CAD (coronary artery disease)    1/19 PCI/DES to [facility] for ISR, normal EF.    Cancer ([place])    hx of precancerous cells in right breast    Cataract    Cervical dysplasia    unsure of procedure, possible "burning" in her late 70s   CHF (congestive heart failure) ([place])    Echo 06/2019: EF 55-60, elevated LVEDP, normal RV SF, mild MAC, mild MR, trivial TR   Complication of anesthesia    COPD (chronic obstructive pulmonary disease) ([facility])    early    Depression    Dyspnea    with exertion    Family history of adverse reaction to anesthesia    daughter- problems wiht n/v   GERD (gastroesophageal reflux disease)    Gout    Headache(784.0)    Heart murmur    hx of years ago    Hyperlipidemia    Hypertension    Low back pain    Menopausal syndrome    Myocardial infarct ([place]) 2007   hx of   Overactive bladder    PONV (postoperative nausea and vomiting)    Sleep apnea    Past Surgical History:  Procedure Laterality Date   ANGIOPLASTY     stent 2007   BREAST LUMPECTOMY WITH RADIOACTIVE SEED LOCALIZATION Right 04/28/2020   Procedure: RIGHT BREAST LUMPECTOMY X 2  WITH RADIOACTIVE SEED LOCALIZATION;   Surgeon: Donnie Mesa, MD;  Location: [place];  Service: General;  Laterality: Right;  LMA   CARDIAC CATHETERIZATION     COLONOSCOPY     CORONARY STENT INTERVENTION N/A 11/06/2017   Procedure: CORONARY STENT INTERVENTION;  Surgeon: [name], MD;  Location: [place] [facility];  Service: Cardiovascular;  Laterality: N/A;   LEFT HEART CATH AND CORONARY ANGIOGRAPHY N/A 11/06/2017   Procedure: LEFT HEART CATH AND CORONARY ANGIOGRAPHY;  Surgeon: [name], MD;  Location: [place] [facility];  Service: Cardiovascular;  Laterality: N/A;   LEFT HEART CATH AND CORONARY ANGIOGRAPHY N/A 07/24/2019   Procedure: LEFT HEART CATH AND CORONARY ANGIOGRAPHY;  Surgeon: Burnell Blanks, MD;  Location: [place] [facility];  Service: Cardiovascular;  Laterality: N/A;   RIGHT/LEFT HEART CATH AND CORONARY ANGIOGRAPHY N/A 09/29/2020   Procedure: RIGHT/LEFT HEART CATH AND CORONARY ANGIOGRAPHY;  Surgeon: Belva Crome, MD;  Location: [place] [facility];  Service: Cardiovascular;  Laterality: N/A;   ROBOTIC ASSISTED BILATERAL SALPINGO OOPHERECTOMY Bilateral 11/10/2020   Procedure: XI ROBOTIC ASSISTED  BILATERAL SALPINGO OOPHORECTOMY WITH MINI LAPAROTOMY FOR DRAINAGE;  Surgeon: Lafonda Mosses, MD;  Location: [facility];  Service: Gynecology;  Laterality: Bilateral;  MINI LAP FIRST   TONSILLECTOMY     VULVECTOMY N/A 11/10/2020   Procedure: WIDE EXCISION VULVECTOMY;  Surgeon: Lafonda Mosses, MD;  Location: [facility];  Service: Gynecology;  Laterality: N/A;   Patient Active Problem List   Diagnosis Date Noted   Dizziness 01/19/2022   Frequent falls 01/19/2022   Tremor of both hands 08/16/2021   Aortic atherosclerosis ([place]) 06/15/2021   Generalized osteoarthritis of multiple sites 12/25/2020   Chronic pain disorder 12/25/2020   Pelvic mass in female    Bilateral lower extremity edema 04/13/2020   Shortness of breath    Anxiety disorder 10/29/2018   CKD (chronic kidney disease),  stage III ([place]) 06/27/2018   COPD (chronic obstructive pulmonary disease) ([place]) 05/25/2018   Obesity 03/20/2018   Dyspnea on exertion 11/06/2017   Abnormal stress test 11/06/2017   Chronic diastolic CHF (congestive heart failure) ([place]) 10/03/2017   Chronic combined systolic and diastolic heart failure ([place]) 09/04/2017   Left ventricular dysfunction 07/31/2017   Cataract, nuclear sclerotic senile, bilateral 02/01/2017   Gouty arthritis of toe of left foot 08/09/2016   Hyperuricemia 08/09/2016   Osteoarthritis (arthritis due to wear and tear of joints) 01/14/2014   Coronary artery disease 08/02/2011   Mitral regurgitation 08/02/2011   OVERACTIVE BLADDER 07/02/2010   ACUTE CYSTITIS 05/25/2010   Pure hypercholesterolemia 08/11/2009   PEDAL EDEMA 07/22/2009   GERD 04/11/2008   Essential hypertension 05/30/2007   MYOCARDIAL INFARCTION, HX OF 05/30/2007   Allergic rhinitis 05/30/2007   LOW BACK PAIN 05/30/2007    REFERRING DIAG: M25.511,G89.29 (ICD-10-CM) - Chronic right shoulder pain   THERAPY DIAG:  Chronic right shoulder pain  Muscle weakness (generalized)  Dizziness and giddiness  Unsteadiness on feet  Repeated falls  Difficulty in walking, not elsewhere classified  Rationale for Evaluation and Treatment Rehabilitation  PERTINENT HISTORY: Dizziness, CVA, Hx of falls, OA  PRECAUTIONS: Fall Risk  SUBJECTIVE: Pt continues to report right shoulder burning and a fear of falling.  PAIN:  Are you having pain? Yes: NPRS scale: 4/10 Pain location: right shoulder Pain description: burning Aggravating factors: use Relieving factors: rest  PATIENT GOALS:  Pt would like to be able to move around better without pain.  OBJECTIVE: (objective measures completed at initial evaluation unless otherwise dated)  DIAGNOSTIC FINDINGS:  01/16/2022 CT of cervical spine:  1. No acute fracture or traumatic subluxation of the cervical spine.  2. Stable 3 mm right upper lobe nodule. No  follow-up needed if patient is low-risk.This recommendation follows the consensus statement: Guidelines for Management of Incidental Pulmonary Nodules Detected on CT Images: From the Fleischner Society 2017; Radiology 2017; 284:228-243.   PATIENT SURVEYS:  03/09/2022: FOTO 51% (projected 58% by visit 11)   COGNITION:           Overall cognitive status: Within functional limits for tasks assessed                                  SENSATION: Light touch: WFL   POSTURE: Forward head, rounded shoulders   UPPER EXTREMITY ROM:    Active ROM Right 03/09/2022 Left 03/09/2022  Shoulder flexion 115 137  Shoulder extension      Shoulder abduction 116 125  Shoulder adduction      Shoulder internal rotation  Shoulder external rotation      Elbow flexion      Elbow extension      Wrist flexion      Wrist extension      Wrist ulnar deviation      Wrist radial deviation      Wrist pronation      Wrist supination      (Blank rows = not tested)   UPPER EXTREMITY MMT:   MMT Right 03/09/2022 Left 03/09/2022  Shoulder flexion 4 5  Shoulder extension      Shoulder abduction 4 5  Shoulder adduction      Shoulder internal rotation 4 5  Shoulder external rotation 4 4  Middle trapezius      Lower trapezius      Elbow flexion      Elbow extension      Wrist flexion      Wrist extension      Wrist ulnar deviation      Wrist radial deviation      Wrist pronation      Wrist supination      Grip strength (lbs)      (Blank rows = not tested)   SHOULDER SPECIAL TESTS:            Impingement tests: Hawkins/Kennedy impingement test: positive right            Rotator cuff assessment: Empty can test: negative   JOINT MOBILITY TESTING:  Crepetis noted in right shoulder   PALPATION:  Tender to palpation along shoulder joint   FUNCTIONAL TESTING: 03/09/2022: 5 times sit to/from stand:  18.7 seconds with UE use TUG 19.3 sec with SPC             TODAY'S TREATMENT:   03/25/2022: Nustep  level 5 x6 min with PT present to discuss status Shoulder flexion and chest press with cane with 1# 2x10 each Ambulation down PT hallway performing horizontal and vertical head turns x2 laps each Wall wash for shoulder flexion and scaption x10 reps each Scapular stabilization with small green ball on wall with x20 CW and x20 CCW circles Seated with yellow theraband: shoulder ER, horizontal abduction, rows.  BUE 2x10 Upper trap and levator stretch to right side x20 sec each Manual therapy to right upper trap for soft tissue mobilization to promote tissue elongation and decreased pain  03/24/2022: Nustep level 5 x6 min with PT present to discuss status Shoulder flexion with cane with 1# 2x10 Seated with yellow theraband: shoulder ER, horizontal abduction, rows, shoulder extension, seated UE D2 PNF.  BUE 2x10 Wall wash for shoulder flexion and scaption x10 reps each Scapular stabilization with small green ball on wall with x20 CW and x20 CCW circles Wall push ups 2x10  03/18/2022: Seated with yellow theraband: shoulder ER, horizontal abduction, rows, shoulder extension, seated UE D2 PNF.  BUE 2x10 Nustep level 5 x6 min with PT present to discuss status Shoulder flexion with cane with 1# 2x10 Theraputty grip strengthening with yellow putty x1 min bilat Wall wash for shoulder flexion x10 reps     PATIENT EDUCATION: Education details: Issued HEP and provided with yellow tband Person educated: Patient Education method: Explanation, Demonstration, and Handouts Education comprehension: verbalized understanding and returned demonstration     HOME EXERCISE PROGRAM: Access Code: [email or web address] URL: [email or web address] Date: 03/25/2022 Prepared by: Shelby Dubin Shatia Sindoni  Exercises - Shoulder External Rotation and Scapular Retraction with Resistance  - 1-2 x daily - 7 x weekly - 2 sets -  10 reps - Standing Shoulder Horizontal Abduction with Resistance  - 1-2 x daily - 7 x weekly - 2 sets -  10 reps - Seated Scapular Retraction  - 1-2 x daily - 7 x weekly - 2 sets - 10 reps - Seated Upper Trapezius Stretch  - 1 x daily - 7 x weekly - 1 sets - 2 reps - 20 sec hold - Seated Levator Scapulae Stretch  - 1 x daily - 7 x weekly - 1 sets - 2 reps - 20 sec hold   ASSESSMENT:   CLINICAL IMPRESSION: Tracy [name] presents to skilled rehabilitation with reports of some burning in her right shoulder.  Pt with upper trap tightness, added upper trap and levator stretch to HEP.  Pt able to demonstrate stretch and reports improvement in symptoms.  Pt tolerated soft tissue mobilization to right upper trap with reports of feeling better and looser following.  Pt with some unsteadiness noted during head turns with ambulation down PT hallway and utilized SPC and close SBA for improved balance and stability.  Pt continues to require skilled PT to progress towards goal related activities.     OBJECTIVE IMPAIRMENTS decreased balance, difficulty walking, decreased ROM, decreased strength, dizziness, increased muscle spasms, impaired UE functional use, postural dysfunction, and pain.    ACTIVITY LIMITATIONS carrying and lifting   PARTICIPATION LIMITATIONS: cleaning and driving   PERSONAL FACTORS Time since onset of injury/illness/exacerbation and 3+ comorbidities: COPD, CHF, HTN, OA  are also affecting patient's functional outcome.    REHAB POTENTIAL: Good   CLINICAL DECISION MAKING: Evolving/moderate complexity   EVALUATION COMPLEXITY: Moderate     GOALS: Goals reviewed with patient? Yes   SHORT TERM GOALS: Target date: 03/30/2022     Pt will be independent with initial HEP. Baseline: Goal status: Goal Met 03/25/2022   2.  Pt will report at least a 40% improvement in symptoms since starting PT. Baseline:  Goal status: Ongoing (35-45% better reported on 03/24/22)     LONG TERM GOALS: Target date: 05/04/2022     Pt will be independent with advanced HEP. Baseline:  Goal status: INITIAL   2.   Pt will increase FOTO to 58% to demonstrate her improvements in functional mobility. Baseline: 51% Goal status: INITIAL   3.  Pt will report being able to fasten/unfasten her bra without increased pain. Baseline:  Goal status: INITIAL   4.  Pt will decrease time on TUG to 14 seconds or less without assistive device and good safety to decrease risk of falling. Baseline: 19.3 sec with SPC Goal status: INITIAL   5.  Pt will increase right shoulder strength to at least 4+/5 to allow her to carry groceries without difficulty. Baseline:  Goal status: INITIAL   6.  Pt will state no dizziness with functional tasks, including reaching during grocery shopping for at least the week prior to discharge. Baseline:  Goal status: INITIAL     PLAN: PT FREQUENCY: 2x/week   PT DURATION: 8 weeks   PLANNED INTERVENTIONS: Therapeutic exercises, Therapeutic activity, Neuromuscular re-education, Balance training, Gait training, Patient/Family education, Joint manipulation, Joint mobilization, Stair training, Vestibular training, Aquatic Therapy, Dry Needling, Electrical stimulation, Spinal manipulation, Spinal mobilization, Cryotherapy, Moist heat, Taping, Vasopneumatic device, Traction, Ultrasound, Ionotophoresis 78m/ml Dexamethasone, Manual therapy, and Re-evaluation   PLAN FOR NEXT SESSION: assess and progress HEP, strengthening, balance     SJuel Burrow PT 03/25/2022, 9:27 AM  [facility] [address] SBellGRowena [place] [address]Phone #  [phone] Fax [phone]

## 2022-03-28 ENCOUNTER — Other Ambulatory Visit: Payer: Self-pay | Admitting: Cardiovascular Disease

## 2022-03-29 ENCOUNTER — Ambulatory Visit: Payer: Medicare Other

## 2022-03-29 DIAGNOSIS — M6281 Muscle weakness (generalized): Secondary | ICD-10-CM | POA: Diagnosis not present

## 2022-03-29 DIAGNOSIS — G8929 Other chronic pain: Secondary | ICD-10-CM | POA: Diagnosis not present

## 2022-03-29 DIAGNOSIS — R42 Dizziness and giddiness: Secondary | ICD-10-CM | POA: Diagnosis not present

## 2022-03-29 DIAGNOSIS — M25511 Pain in right shoulder: Secondary | ICD-10-CM | POA: Diagnosis not present

## 2022-03-29 DIAGNOSIS — R2681 Unsteadiness on feet: Secondary | ICD-10-CM | POA: Diagnosis not present

## 2022-03-29 DIAGNOSIS — R296 Repeated falls: Secondary | ICD-10-CM | POA: Diagnosis not present

## 2022-03-29 NOTE — Therapy (Signed)
OUTPATIENT PHYSICAL THERAPY TREATMENT NOTE   Patient Name: Tracy [name] MRN: [id number] DOB:Nov 16, 1945, 76 y.o., female Today's Date: 03/29/2022  PCP: [name], [name], MD  REFERRING PROVIDER: [name], [name], MD   END OF SESSION:   PT End of Session - 03/29/22 0928     Visit Number 6    Date for PT Re-Evaluation 04/29/22    Authorization Type Med A- KX modifier needed    Progress Note Due on Visit 10    PT Start Time 0845    PT Stop Time 0927    PT Time Calculation (min) 42 min    Activity Tolerance Patient tolerated treatment well    Behavior During Therapy [facility] for tasks assessed/performed              Past Medical History:  Diagnosis Date   Allergic rhinitis    Allergy    Anxiety    Arthritis    Breast cancer ([place]) 04/28/2020   rigtht breast   Bronchitis    CAD (coronary artery disease)    1/19 PCI/DES to [place] for ISR, normal EF.    Cancer ([place])    hx of precancerous cells in right breast    Cataract    Cervical dysplasia    unsure of procedure, possible "burning" in her late 75s   CHF (congestive heart failure) ([place])    Echo 06/2019: EF 55-60, elevated LVEDP, normal RV SF, mild MAC, mild MR, trivial TR   Complication of anesthesia    COPD (chronic obstructive pulmonary disease) (HCC)    early    Depression    Dyspnea    with exertion    Family history of adverse reaction to anesthesia    daughter- problems wiht n/v   GERD (gastroesophageal reflux disease)    Gout    Headache(784.0)    Heart murmur    hx of years ago    Hyperlipidemia    Hypertension    Low back pain    Menopausal syndrome    Myocardial infarct ([place]) 2007   hx of   Overactive bladder    PONV (postoperative nausea and vomiting)    Sleep apnea    Past Surgical History:  Procedure Laterality Date   ANGIOPLASTY     stent 2007   BREAST LUMPECTOMY WITH RADIOACTIVE SEED LOCALIZATION Right 04/28/2020   Procedure: RIGHT BREAST LUMPECTOMY X 2  WITH RADIOACTIVE SEED  LOCALIZATION;  Surgeon: Donnie Mesa, MD;  Location: [place];  Service: General;  Laterality: Right;  LMA   CARDIAC CATHETERIZATION     COLONOSCOPY     CORONARY STENT INTERVENTION N/A 11/06/2017   Procedure: CORONARY STENT INTERVENTION;  Surgeon: [name], MD;  Location: [place] [facility];  Service: Cardiovascular;  Laterality: N/A;   LEFT HEART CATH AND CORONARY ANGIOGRAPHY N/A 11/06/2017   Procedure: LEFT HEART CATH AND CORONARY ANGIOGRAPHY;  Surgeon: [name], MD;  Location: [place] [facility];  Service: Cardiovascular;  Laterality: N/A;   LEFT HEART CATH AND CORONARY ANGIOGRAPHY N/A 07/24/2019   Procedure: LEFT HEART CATH AND CORONARY ANGIOGRAPHY;  Surgeon: Burnell Blanks, MD;  Location: [place] [facility];  Service: Cardiovascular;  Laterality: N/A;   RIGHT/LEFT HEART CATH AND CORONARY ANGIOGRAPHY N/A 09/29/2020   Procedure: RIGHT/LEFT HEART CATH AND CORONARY ANGIOGRAPHY;  Surgeon: Belva Crome, MD;  Location: [place] [facility];  Service: Cardiovascular;  Laterality: N/A;   ROBOTIC ASSISTED BILATERAL SALPINGO OOPHERECTOMY Bilateral 11/10/2020   Procedure: XI ROBOTIC  ASSISTED BILATERAL SALPINGO OOPHORECTOMY WITH MINI LAPAROTOMY FOR DRAINAGE;  Surgeon: Lafonda Mosses, MD;  Location: [facility];  Service: Gynecology;  Laterality: Bilateral;  MINI LAP FIRST   TONSILLECTOMY     VULVECTOMY N/A 11/10/2020   Procedure: WIDE EXCISION VULVECTOMY;  Surgeon: Lafonda Mosses, MD;  Location: [facility];  Service: Gynecology;  Laterality: N/A;   Patient Active Problem List   Diagnosis Date Noted   Dizziness 01/19/2022   Frequent falls 01/19/2022   Tremor of both hands 08/16/2021   Aortic atherosclerosis ([place]) 06/15/2021   Generalized osteoarthritis of multiple sites 12/25/2020   Chronic pain disorder 12/25/2020   Pelvic mass in female    Bilateral lower extremity edema 04/13/2020   Shortness of breath    Anxiety disorder 10/29/2018   CKD (chronic kidney  disease), stage III ([place]) 06/27/2018   COPD (chronic obstructive pulmonary disease) ([place]) 05/25/2018   Obesity 03/20/2018   Dyspnea on exertion 11/06/2017   Abnormal stress test 11/06/2017   Chronic diastolic CHF (congestive heart failure) ([place]) 10/03/2017   Chronic combined systolic and diastolic heart failure ([place]) 09/04/2017   Left ventricular dysfunction 07/31/2017   Cataract, nuclear sclerotic senile, bilateral 02/01/2017   Gouty arthritis of toe of left foot 08/09/2016   Hyperuricemia 08/09/2016   Osteoarthritis (arthritis due to wear and tear of joints) 01/14/2014   Coronary artery disease 08/02/2011   Mitral regurgitation 08/02/2011   OVERACTIVE BLADDER 07/02/2010   ACUTE CYSTITIS 05/25/2010   Pure hypercholesterolemia 08/11/2009   PEDAL EDEMA 07/22/2009   GERD 04/11/2008   Essential hypertension 05/30/2007   MYOCARDIAL INFARCTION, HX OF 05/30/2007   Allergic rhinitis 05/30/2007   LOW BACK PAIN 05/30/2007    REFERRING DIAG: M25.511,G89.29 (ICD-10-CM) - Chronic right shoulder pain   THERAPY DIAG:  Chronic right shoulder pain  Muscle weakness (generalized)  Rationale for Evaluation and Treatment Rehabilitation  PERTINENT HISTORY: Dizziness, CVA, Hx of falls, OA  PRECAUTIONS: Fall Risk  SUBJECTIVE: Pt continues to report right shoulder burning and a fear of falling.  PAIN:  Are you having pain? Yes: NPRS scale: 4-5/10 Pain location: right shoulder Pain description: burning Aggravating factors: use Relieving factors: rest  PATIENT GOALS:  Pt would like to be able to move around better without pain.  OBJECTIVE: (objective measures completed at initial evaluation unless otherwise dated)  DIAGNOSTIC FINDINGS:  01/16/2022 CT of cervical spine:  1. No acute fracture or traumatic subluxation of the cervical spine.  2. Stable 3 mm right upper lobe nodule. No follow-up needed if patient is low-risk.This recommendation follows the consensus statement: Guidelines for  Management of Incidental Pulmonary Nodules Detected on CT Images: From the Fleischner Society 2017; Radiology 2017; 284:228-243.   PATIENT SURVEYS:  03/09/2022: FOTO 51% (projected 58% by visit 11)   COGNITION:           Overall cognitive status: Within functional limits for tasks assessed                                  SENSATION: Light touch: WFL   POSTURE: Forward head, rounded shoulders   UPPER EXTREMITY ROM:    Active ROM Right 03/09/2022 Left 03/09/2022  Shoulder flexion 115 137  Shoulder extension      Shoulder abduction 116 125  Shoulder adduction      Shoulder internal rotation      Shoulder external rotation      Elbow flexion  Elbow extension      Wrist flexion      Wrist extension      Wrist ulnar deviation      Wrist radial deviation      Wrist pronation      Wrist supination      (Blank rows = not tested)   UPPER EXTREMITY MMT:   MMT Right 03/09/2022 Left 03/09/2022  Shoulder flexion 4 5  Shoulder extension      Shoulder abduction 4 5  Shoulder adduction      Shoulder internal rotation 4 5  Shoulder external rotation 4 4  Middle trapezius      Lower trapezius      Elbow flexion      Elbow extension      Wrist flexion      Wrist extension      Wrist ulnar deviation      Wrist radial deviation      Wrist pronation      Wrist supination      Grip strength (lbs)      (Blank rows = not tested)   SHOULDER SPECIAL TESTS:            Impingement tests: Hawkins/Kennedy impingement test: positive right            Rotator cuff assessment: Empty can test: negative   JOINT MOBILITY TESTING:  Crepetis noted in right shoulder   PALPATION:  Tender to palpation along shoulder joint   FUNCTIONAL TESTING: 03/09/2022: 5 times sit to/from stand:  18.7 seconds with UE use TUG 19.3 sec with [facility]              03/29/22: 5x sit to stand: 23 seconds (pt reports dizziness today)   TODAY'S TREATMENT:  03/29/2022: Nustep level 5 x6 min with PT present to  discuss status Shoulder flexion and chest press with cane with 1# 2x10 each Ambulation down PT hallway performing horizontal and vertical head turns x2 laps each Scapular stabilization with small green ball on wall with x20 CW and x20 CCW circles Seated with yellow theraband: shoulder ER, horizontal abduction, rows.  BUE 2x10 Upper trap and levator stretch to right side x20 sec each 1# weight: flexion 2x10 on Rt  03/25/2022: Nustep level 5 x6 min with PT present to discuss status Shoulder flexion and chest press with cane with 1# 2x10 each Ambulation down PT hallway performing horizontal and vertical head turns x2 laps each Wall wash for shoulder flexion and scaption x10 reps each Scapular stabilization with small green ball on wall with x20 CW and x20 CCW circles Seated with yellow theraband: shoulder ER, horizontal abduction, rows.  BUE 2x10 Upper trap and levator stretch to right side x20 sec each Manual therapy to right upper trap for soft tissue mobilization to promote tissue elongation and decreased pain  03/24/2022: Nustep level 5 x6 min with PT present to discuss status Shoulder flexion with cane with 1# 2x10 Seated with yellow theraband: shoulder ER, horizontal abduction, rows, shoulder extension, seated UE D2 PNF.  BUE 2x10 Wall wash for shoulder flexion and scaption x10 reps each Scapular stabilization with small green ball on wall with x20 CW and x20 CCW circles Wall push ups 2x10     PATIENT EDUCATION: Education details: Issued HEP and provided with yellow tband Person educated: Patient Education method: Explanation, Demonstration, and Handouts Education comprehension: verbalized understanding and returned demonstration     HOME EXERCISE PROGRAM: Access Code: [email or web address] URL: [email or web address] Date: 03/25/2022 Prepared by: Shelby Dubin  [place]  Exercises - Shoulder External Rotation and Scapular Retraction with Resistance  - 1-2 x daily - 7 x weekly - 2 sets  - 10 reps - Standing Shoulder Horizontal Abduction with Resistance  - 1-2 x daily - 7 x weekly - 2 sets - 10 reps - Seated Scapular Retraction  - 1-2 x daily - 7 x weekly - 2 sets - 10 reps - Seated Upper Trapezius Stretch  - 1 x daily - 7 x weekly - 1 sets - 2 reps - 20 sec hold - Seated Levator Scapulae Stretch  - 1 x daily - 7 x weekly - 1 sets - 2 reps - 20 sec hold   ASSESSMENT:   CLINICAL IMPRESSION: Pt arrived with 4-5/10 Rt shoulder pain today and reports as burning.   Pt able to demonstrate stretch and reports improvement in symptoms.  Pt tolerated all activities in the clinic and required intermittent tactile cues for scapular position.   5x sit to stand was slower today and pt reports some dizziness which might be impacting this. Walking with head turns required close guarding due to unsteadiness.   Pt continues to require skilled PT to progress towards goal related activities.     OBJECTIVE IMPAIRMENTS decreased balance, difficulty walking, decreased ROM, decreased strength, dizziness, increased muscle spasms, impaired UE functional use, postural dysfunction, and pain.    ACTIVITY LIMITATIONS carrying and lifting   PARTICIPATION LIMITATIONS: cleaning and driving   PERSONAL FACTORS Time since onset of injury/illness/exacerbation and 3+ comorbidities: COPD, CHF, HTN, OA  are also affecting patient's functional outcome.    REHAB POTENTIAL: Good   CLINICAL DECISION MAKING: Evolving/moderate complexity   EVALUATION COMPLEXITY: Moderate     GOALS: Goals reviewed with patient? Yes   SHORT TERM GOALS: Target date: 03/30/2022     Pt will be independent with initial HEP. Baseline: Goal status: Goal Met 03/25/2022   2.  Pt will report at least a 40% improvement in symptoms since starting PT. Baseline:  Goal status: Ongoing (35-45% better reported on 03/24/22)     LONG TERM GOALS: Target date: 05/04/2022     Pt will be independent with advanced HEP. Baseline:  Goal status: MET  (03/29/22)   2.  Pt will increase FOTO to 58% to demonstrate her improvements in functional mobility. Baseline: 51% Goal status: INITIAL   3.  Pt will report being able to fasten/unfasten her bra without increased pain. Baseline: burning pain Goal status: In progress   4.  Pt will decrease time on TUG to 14 seconds or less without assistive device and good safety to decrease risk of falling. Baseline: 19.3 sec with SPC Goal status: INITIAL   5.  Pt will increase right shoulder strength to at least 4+/5 to allow her to carry groceries without difficulty. Baseline:  Goal status: INITIAL   6.  Pt will state no dizziness with functional tasks, including reaching during grocery shopping for at least the week prior to discharge. Baseline:  Goal status: INITIAL     PLAN: PT FREQUENCY: 2x/week   PT DURATION: 8 weeks   PLANNED INTERVENTIONS: Therapeutic exercises, Therapeutic activity, Neuromuscular re-education, Balance training, Gait training, Patient/Family education, Joint manipulation, Joint mobilization, Stair training, Vestibular training, Aquatic Therapy, Dry Needling, Electrical stimulation, Spinal manipulation, Spinal mobilization, Cryotherapy, Moist heat, Taping, Vasopneumatic device, Traction, Ultrasound, Ionotophoresis 2m/ml Dexamethasone, Manual therapy, and Re-evaluation   PLAN FOR NEXT SESSION: assess and progress HEP, strengthening, balance     KSigurd Sos PT 03/29/22 9:29 AM  [facility] [address], [address] [place], [place] [address] Phone # [phone] Fax [phone] 

## 2022-03-31 ENCOUNTER — Ambulatory Visit: Payer: Medicare Other

## 2022-03-31 DIAGNOSIS — M6281 Muscle weakness (generalized): Secondary | ICD-10-CM

## 2022-03-31 DIAGNOSIS — R262 Difficulty in walking, not elsewhere classified: Secondary | ICD-10-CM

## 2022-03-31 DIAGNOSIS — R2681 Unsteadiness on feet: Secondary | ICD-10-CM | POA: Diagnosis not present

## 2022-03-31 DIAGNOSIS — R42 Dizziness and giddiness: Secondary | ICD-10-CM | POA: Diagnosis not present

## 2022-03-31 DIAGNOSIS — G8929 Other chronic pain: Secondary | ICD-10-CM

## 2022-03-31 DIAGNOSIS — M25511 Pain in right shoulder: Secondary | ICD-10-CM | POA: Diagnosis not present

## 2022-03-31 DIAGNOSIS — R296 Repeated falls: Secondary | ICD-10-CM | POA: Diagnosis not present

## 2022-03-31 NOTE — Therapy (Signed)
OUTPATIENT PHYSICAL THERAPY TREATMENT NOTE   Patient Name: Tracy [name] MRN: [id number] DOB:1946-05-09, 76 y.o., female Today's Date: 03/31/2022  PCP: [name], [name], MD  REFERRING PROVIDER: [name], [name], MD   END OF SESSION:   PT End of Session - 03/31/22 0928     Visit Number 7    Date for PT Re-Evaluation 04/29/22    Authorization Type Med A- KX modifier needed    Progress Note Due on Visit 10    PT Start Time 0846    PT Stop Time 0927    PT Time Calculation (min) 41 min    Activity Tolerance Patient tolerated treatment well    Behavior During Therapy [facility] for tasks assessed/performed               Past Medical History:  Diagnosis Date   Allergic rhinitis    Allergy    Anxiety    Arthritis    Breast cancer ([place]) 04/28/2020   rigtht breast   Bronchitis    CAD (coronary artery disease)    1/19 PCI/DES to [place] for ISR, normal EF.    Cancer ([place])    hx of precancerous cells in right breast    Cataract    Cervical dysplasia    unsure of procedure, possible "burning" in her late 51s   CHF (congestive heart failure) ([place])    Echo 06/2019: EF 55-60, elevated LVEDP, normal RV SF, mild MAC, mild MR, trivial TR   Complication of anesthesia    COPD (chronic obstructive pulmonary disease) (H[facility])    early    Depression    Dyspnea    with exertion    Family history of adverse reaction to anesthesia    daughter- problems wiht n/v   GERD (gastroesophageal reflux disease)    Gout    Headache(784.0)    Heart murmur    hx of years ago    Hyperlipidemia    Hypertension    Low back pain    Menopausal syndrome    Myocardial infarct ([place]) 2007   hx of   Overactive bladder    PONV (postoperative nausea and vomiting)    Sleep apnea    Past Surgical History:  Procedure Laterality Date   ANGIOPLASTY     stent 2007   BREAST LUMPECTOMY WITH RADIOACTIVE SEED LOCALIZATION Right 04/28/2020   Procedure: RIGHT BREAST LUMPECTOMY X 2  WITH RADIOACTIVE SEED  LOCALIZATION;  Surgeon: Donnie Mesa, MD;  Location: [place];  Service: General;  Laterality: Right;  LMA   CARDIAC CATHETERIZATION     COLONOSCOPY     CORONARY STENT INTERVENTION N/A 11/06/2017   Procedure: CORONARY STENT INTERVENTION;  Surgeon: [name], MD;  Location: [place] [facility];  Service: Cardiovascular;  Laterality: N/A;   LEFT HEART CATH AND CORONARY ANGIOGRAPHY N/A 11/06/2017   Procedure: LEFT HEART CATH AND CORONARY ANGIOGRAPHY;  Surgeon: [name], MD;  Location: [place] [facility];  Service: Cardiovascular;  Laterality: N/A;   LEFT HEART CATH AND CORONARY ANGIOGRAPHY N/A 07/24/2019   Procedure: LEFT HEART CATH AND CORONARY ANGIOGRAPHY;  Surgeon: Burnell Blanks, MD;  Location: [place] [facility];  Service: Cardiovascular;  Laterality: N/A;   RIGHT/LEFT HEART CATH AND CORONARY ANGIOGRAPHY N/A 09/29/2020   Procedure: RIGHT/LEFT HEART CATH AND CORONARY ANGIOGRAPHY;  Surgeon: Belva Crome, MD;  Location: [place] [facility];  Service: Cardiovascular;  Laterality: N/A;   ROBOTIC ASSISTED BILATERAL SALPINGO OOPHERECTOMY Bilateral 11/10/2020   Procedure: XI  ROBOTIC ASSISTED BILATERAL SALPINGO OOPHORECTOMY WITH MINI LAPAROTOMY FOR DRAINAGE;  Surgeon: Lafonda Mosses, MD;  Location: [facility];  Service: Gynecology;  Laterality: Bilateral;  MINI LAP FIRST   TONSILLECTOMY     VULVECTOMY N/A 11/10/2020   Procedure: WIDE EXCISION VULVECTOMY;  Surgeon: Lafonda Mosses, MD;  Location: [facility];  Service: Gynecology;  Laterality: N/A;   Patient Active Problem List   Diagnosis Date Noted   Dizziness 01/19/2022   Frequent falls 01/19/2022   Tremor of both hands 08/16/2021   Aortic atherosclerosis ([place]) 06/15/2021   Generalized osteoarthritis of multiple sites 12/25/2020   Chronic pain disorder 12/25/2020   Pelvic mass in female    Bilateral lower extremity edema 04/13/2020   Shortness of breath    Anxiety disorder 10/29/2018   CKD (chronic kidney  disease), stage III ([place]) 06/27/2018   COPD (chronic obstructive pulmonary disease) ([place]) 05/25/2018   Obesity 03/20/2018   Dyspnea on exertion 11/06/2017   Abnormal stress test 11/06/2017   Chronic diastolic CHF (congestive heart failure) ([place]) 10/03/2017   Chronic combined systolic and diastolic heart failure ([place]) 09/04/2017   Left ventricular dysfunction 07/31/2017   Cataract, nuclear sclerotic senile, bilateral 02/01/2017   Gouty arthritis of toe of left foot 08/09/2016   Hyperuricemia 08/09/2016   Osteoarthritis (arthritis due to wear and tear of joints) 01/14/2014   Coronary artery disease 08/02/2011   Mitral regurgitation 08/02/2011   OVERACTIVE BLADDER 07/02/2010   ACUTE CYSTITIS 05/25/2010   Pure hypercholesterolemia 08/11/2009   PEDAL EDEMA 07/22/2009   GERD 04/11/2008   Essential hypertension 05/30/2007   MYOCARDIAL INFARCTION, HX OF 05/30/2007   Allergic rhinitis 05/30/2007   LOW BACK PAIN 05/30/2007    REFERRING DIAG: M25.511,G89.29 (ICD-10-CM) - Chronic right shoulder pain   THERAPY DIAG:  Chronic right shoulder pain  Muscle weakness (generalized)  Unsteadiness on feet  Difficulty in walking, not elsewhere classified  Rationale for Evaluation and Treatment Rehabilitation  PERTINENT HISTORY: Dizziness, CVA, Hx of falls, OA  PRECAUTIONS: Fall Risk  SUBJECTIVE: I feel weak.  I'm sore in my arms and legs.    PAIN:  Are you having pain? Yes: NPRS scale: 4/10 Pain location: right shoulder Pain description: burning Aggravating factors: use Relieving factors: rest  PATIENT GOALS:  Pt would like to be able to move around better without pain.  OBJECTIVE: (objective measures completed at initial evaluation unless otherwise dated)  DIAGNOSTIC FINDINGS:  01/16/2022 CT of cervical spine:  1. No acute fracture or traumatic subluxation of the cervical spine.  2. Stable 3 mm right upper lobe nodule. No follow-up needed if patient is low-risk.This recommendation  follows the consensus statement: Guidelines for Management of Incidental Pulmonary Nodules Detected on CT Images: From the Fleischner Society 2017; Radiology 2017; [id number].   PATIENT SURVEYS:  03/09/2022: FOTO 51% (projected 58% by visit 11)   COGNITION:           Overall cognitive status: Within functional limits for tasks assessed                                  SENSATION: Light touch: WFL   POSTURE: Forward head, rounded shoulders   UPPER EXTREMITY ROM:    Active ROM Right 03/09/2022 Left 03/09/2022  Shoulder flexion 115 137  Shoulder extension      Shoulder abduction 116 125  Shoulder adduction      Shoulder internal rotation      Shoulder  external rotation      Elbow flexion      Elbow extension      Wrist flexion      Wrist extension      Wrist ulnar deviation      Wrist radial deviation      Wrist pronation      Wrist supination      (Blank rows = not tested)   UPPER EXTREMITY MMT:   MMT Right 03/09/2022 Left 03/09/2022  Shoulder flexion 4 5  Shoulder extension      Shoulder abduction 4 5  Shoulder adduction      Shoulder internal rotation 4 5  Shoulder external rotation 4 4  Middle trapezius      Lower trapezius      Elbow flexion      Elbow extension      Wrist flexion      Wrist extension      Wrist ulnar deviation      Wrist radial deviation      Wrist pronation      Wrist supination      Grip strength (lbs)      (Blank rows = not tested)   SHOULDER SPECIAL TESTS:            Impingement tests: Hawkins/Kennedy impingement test: positive right            Rotator cuff assessment: Empty can test: negative   JOINT MOBILITY TESTING:  Crepetis noted in right shoulder   PALPATION:  Tender to palpation along shoulder joint   FUNCTIONAL TESTING: 03/09/2022: 5 times sit to/from stand:  18.7 seconds with UE use TUG 19.3 sec with [facility]              03/29/22: 5x sit to stand: 23 seconds (pt reports dizziness today)   TODAY'S TREATMENT:   03/31/2022: Nustep level 5 x6 min with PT present to discuss status Shoulder flexion and chest press with cane with 1# 2x10 each Ambulation down PT hallway performing horizontal and vertical head turns x2 laps each Scapular stabilization with small green ball on wall with x20 CW and x20 CCW circles Seated with yellow theraband: shoulder ER, horizontal abduction, rows.  BUE 2x10 Upper trap and levator stretch to right side x20 sec each 1# weight: flexion 2x10 Bil Sit to stand with mod UE support 2x10  03/29/2022: Nustep level 5 x6 min with PT present to discuss status Shoulder flexion and chest press with cane with 1# 2x10 each Ambulation down PT hallway performing horizontal and vertical head turns x2 laps each Scapular stabilization with small green ball on wall with x20 CW and x20 CCW circles Seated with yellow theraband: shoulder ER, horizontal abduction, rows.  BUE 2x10 Upper trap and levator stretch to right side x20 sec each 1# weight: flexion 2x10 on Rt  03/25/2022: Nustep level 5 x6 min with PT present to discuss status Shoulder flexion and chest press with cane with 1# 2x10 each Ambulation down PT hallway performing horizontal and vertical head turns x2 laps each Wall wash for shoulder flexion and scaption x10 reps each Scapular stabilization with small green ball on wall with x20 CW and x20 CCW circles Seated with yellow theraband: shoulder ER, horizontal abduction, rows.  BUE 2x10 Upper trap and levator stretch to right side x20 sec each Manual therapy to right upper trap for soft tissue mobilization to promote tissue elongation and decreased pain    PATIENT EDUCATION: Education details: Issued HEP and provided with yellow  tband Person educated: Patient Education method: Explanation, Demonstration, and Handouts Education comprehension: verbalized understanding and returned demonstration     HOME EXERCISE PROGRAM: Access Code: [email or web address] URL:  [email or web address] Date: 03/25/2022 Prepared by: Shelby Dubin Menke  Exercises - Shoulder External Rotation and Scapular Retraction with Resistance  - 1-2 x daily - 7 x weekly - 2 sets - 10 reps - Standing Shoulder Horizontal Abduction with Resistance  - 1-2 x daily - 7 x weekly - 2 sets - 10 reps - Seated Scapular Retraction  - 1-2 x daily - 7 x weekly - 2 sets - 10 reps - Seated Upper Trapezius Stretch  - 1 x daily - 7 x weekly - 1 sets - 2 reps - 20 sec hold - Seated Levator Scapulae Stretch  - 1 x daily - 7 x weekly - 1 sets - 2 reps - 20 sec hold   ASSESSMENT:   CLINICAL IMPRESSION: Pt arrived with 4/10 Rt shoulder pain.  Pt tolerated all activities in the clinic and required intermittent tactile cues for scapular position.   Pt with improved sit to stand and gait with head turns with fewer episodes of instability.  Pt was able to complete all exercises in clinic without increased pain today.  Pt continues to require skilled PT to progress towards goal related activities.     OBJECTIVE IMPAIRMENTS decreased balance, difficulty walking, decreased ROM, decreased strength, dizziness, increased muscle spasms, impaired UE functional use, postural dysfunction, and pain.    ACTIVITY LIMITATIONS carrying and lifting   PARTICIPATION LIMITATIONS: cleaning and driving   PERSONAL FACTORS Time since onset of injury/illness/exacerbation and 3+ comorbidities: COPD, CHF, HTN, OA  are also affecting patient's functional outcome.    REHAB POTENTIAL: Good   CLINICAL DECISION MAKING: Evolving/moderate complexity   EVALUATION COMPLEXITY: Moderate     GOALS: Goals reviewed with patient? Yes   SHORT TERM GOALS: Target date: 03/30/2022     Pt will be independent with initial HEP. Baseline: Goal status: Goal Met 03/25/2022   2.  Pt will report at least a 40% improvement in symptoms since starting PT. Baseline:  Goal status: Ongoing (35-45% better reported on 03/24/22)     LONG TERM  GOALS: Target date: 05/04/2022     Pt will be independent with advanced HEP. Baseline:  Goal status: MET (03/29/22)   2.  Pt will increase FOTO to 58% to demonstrate her improvements in functional mobility. Baseline: 51% Goal status: INITIAL   3.  Pt will report being able to fasten/unfasten her bra without increased pain. Baseline: burning pain Goal status: In progress   4.  Pt will decrease time on TUG to 14 seconds or less without assistive device and good safety to decrease risk of falling. Baseline: 19.3 sec with SPC Goal status: INITIAL   5.  Pt will increase right shoulder strength to at least 4+/5 to allow her to carry groceries without difficulty. Baseline:  Goal status: INITIAL   6.  Pt will state no dizziness with functional tasks, including reaching during grocery shopping for at least the week prior to discharge. Baseline:  Goal status: INITIAL     PLAN: PT FREQUENCY: 2x/week   PT DURATION: 8 weeks   PLANNED INTERVENTIONS: Therapeutic exercises, Therapeutic activity, Neuromuscular re-education, Balance training, Gait training, Patient/Family education, Joint manipulation, Joint mobilization, Stair training, Vestibular training, Aquatic Therapy, Dry Needling, Electrical stimulation, Spinal manipulation, Spinal mobilization, Cryotherapy, Moist heat, Taping, Vasopneumatic device, Traction, Ultrasound, Ionotophoresis 69m/ml Dexamethasone, Manual therapy, and Re-evaluation   PLAN  FOR NEXT SESSION: Rt shoulder and scapular strength, balance and gait      Sigurd Sos, PT 03/31/22 9:29 AM   [facility] [address] [place], [place] [address] Phone # [phone] Fax [phone]

## 2022-04-05 ENCOUNTER — Ambulatory Visit: Payer: Medicare Other

## 2022-04-05 DIAGNOSIS — G8929 Other chronic pain: Secondary | ICD-10-CM | POA: Diagnosis not present

## 2022-04-05 DIAGNOSIS — R42 Dizziness and giddiness: Secondary | ICD-10-CM | POA: Diagnosis not present

## 2022-04-05 DIAGNOSIS — M25511 Pain in right shoulder: Secondary | ICD-10-CM | POA: Diagnosis not present

## 2022-04-05 DIAGNOSIS — R2681 Unsteadiness on feet: Secondary | ICD-10-CM

## 2022-04-05 DIAGNOSIS — R296 Repeated falls: Secondary | ICD-10-CM | POA: Diagnosis not present

## 2022-04-05 DIAGNOSIS — R262 Difficulty in walking, not elsewhere classified: Secondary | ICD-10-CM

## 2022-04-05 DIAGNOSIS — M6281 Muscle weakness (generalized): Secondary | ICD-10-CM | POA: Diagnosis not present

## 2022-04-05 NOTE — Therapy (Signed)
OUTPATIENT PHYSICAL THERAPY TREATMENT NOTE   Patient Name: Tracy [name] MRN: [id number] DOB:[birth year], 76 y.o., female Today's Date: 04/05/2022  PCP: [name], [name], MD  REFERRING PROVIDER: [name], [name], MD   END OF SESSION:   PT End of Session - 04/05/22 0929     Visit Number 8    Date for PT Re-Evaluation 04/29/22    Authorization Type Med A- KX modifier needed    Progress Note Due on Visit 10    PT Start Time 0846    PT Stop Time 0924    PT Time Calculation (min) 38 min    Activity Tolerance Patient tolerated treatment well    Behavior During Therapy [facility] for tasks assessed/performed                Past Medical History:  Diagnosis Date   Allergic rhinitis    Allergy    Anxiety    Arthritis    Breast cancer ([place]) 04/28/2020   rigtht breast   Bronchitis    CAD (coronary artery disease)    1/19 PCI/DES to [place] for ISR, normal EF.    Cancer ([place])    hx of precancerous cells in right breast    Cataract    Cervical dysplasia    unsure of procedure, possible "burning" in her late 62s   CHF (congestive heart failure) ([place])    Echo 06/2019: EF 55-60, elevated LVEDP, normal RV SF, mild MAC, mild MR, trivial TR   Complication of anesthesia    COPD (chronic obstructive pulmonary disease) (HCC)    early    Depression    Dyspnea    with exertion    Family history of adverse reaction to anesthesia    daughter- problems wiht n/v   GERD (gastroesophageal reflux disease)    Gout    Headache(784.0)    Heart murmur    hx of years ago    Hyperlipidemia    Hypertension    Low back pain    Menopausal syndrome    Myocardial infarct ([place]) 2007   hx of   Overactive bladder    PONV (postoperative nausea and vomiting)    Sleep apnea    Past Surgical History:  Procedure Laterality Date   ANGIOPLASTY     stent 2007   BREAST LUMPECTOMY WITH RADIOACTIVE SEED LOCALIZATION Right 04/28/2020   Procedure: RIGHT BREAST LUMPECTOMY X 2  WITH RADIOACTIVE SEED  LOCALIZATION;  Surgeon: Donnie Mesa, MD;  Location: [facility];  Service: General;  Laterality: Right;  LMA   CARDIAC CATHETERIZATION     COLONOSCOPY     CORONARY STENT INTERVENTION N/A 11/06/2017   Procedure: CORONARY STENT INTERVENTION;  Surgeon: [name], MD;  Location: [place];  Service: Cardiovascular;  Laterality: N/A;   LEFT HEART CATH AND CORONARY ANGIOGRAPHY N/A 11/06/2017   Procedure: LEFT HEART CATH AND CORONARY ANGIOGRAPHY;  Surgeon: [name], MD;  Location: [place] [facility];  Service: Cardiovascular;  Laterality: N/A;   LEFT HEART CATH AND CORONARY ANGIOGRAPHY N/A 07/24/2019   Procedure: LEFT HEART CATH AND CORONARY ANGIOGRAPHY;  Surgeon: Burnell Blanks, MD;  Location: [place] [facility];  Service: Cardiovascular;  Laterality: N/A;   RIGHT/LEFT HEART CATH AND CORONARY ANGIOGRAPHY N/A 09/29/2020   Procedure: RIGHT/LEFT HEART CATH AND CORONARY ANGIOGRAPHY;  Surgeon: Belva Crome, MD;  Location: [facility];  Service: Cardiovascular;  Laterality: N/A;   ROBOTIC ASSISTED BILATERAL SALPINGO OOPHERECTOMY Bilateral 11/10/2020   Procedure:  XI ROBOTIC ASSISTED BILATERAL SALPINGO OOPHORECTOMY WITH MINI LAPAROTOMY FOR DRAINAGE;  Surgeon: Tucker, Katherine R, MD;  Location: [facility];  Service: Gynecology;  Laterality: Bilateral;  MINI LAP FIRST   TONSILLECTOMY     VULVECTOMY N/A 11/10/2020   Procedure: WIDE EXCISION VULVECTOMY;  Surgeon: Tucker, Katherine R, MD;  Location: [facility];  Service: Gynecology;  Laterality: N/A;   Patient Active Problem List   Diagnosis Date Noted   Dizziness 01/19/2022   Frequent falls 01/19/2022   Tremor of both hands 08/16/2021   Aortic atherosclerosis (HCC) 06/15/2021   Generalized osteoarthritis of multiple sites 12/25/2020   Chronic pain disorder 12/25/2020   Pelvic mass in female    Bilateral lower extremity edema 04/13/2020   Shortness of breath    Anxiety disorder 10/29/2018   CKD (chronic kidney  disease), stage III (HCC) 06/27/2018   COPD (chronic obstructive pulmonary disease) (HCC) 05/25/2018   Obesity 03/20/2018   Dyspnea on exertion 11/06/2017   Abnormal stress test 11/06/2017   Chronic diastolic CHF (congestive heart failure) (HCC) 10/03/2017   Chronic combined systolic and diastolic heart failure (HCC) 09/04/2017   Left ventricular dysfunction 07/31/2017   Cataract, nuclear sclerotic senile, bilateral 02/01/2017   Gouty arthritis of toe of left foot 08/09/2016   Hyperuricemia 08/09/2016   Osteoarthritis (arthritis due to wear and tear of joints) 01/14/2014   Coronary artery disease 08/02/2011   Mitral regurgitation 08/02/2011   OVERACTIVE BLADDER 07/02/2010   ACUTE CYSTITIS 05/25/2010   Pure hypercholesterolemia 08/11/2009   PEDAL EDEMA 07/22/2009   GERD 04/11/2008   Essential hypertension 05/30/2007   MYOCARDIAL INFARCTION, HX OF 05/30/2007   Allergic rhinitis 05/30/2007   LOW BACK PAIN 05/30/2007    REFERRING DIAG: M25.511,G89.29 (ICD-10-CM) - Chronic right shoulder pain   THERAPY DIAG:  Chronic right shoulder pain  Muscle weakness (generalized)  Unsteadiness on feet  Difficulty in walking, not elsewhere classified  Rationale for Evaluation and Treatment Rehabilitation  PERTINENT HISTORY: Dizziness, CVA, Hx of falls, OA  PRECAUTIONS: Fall Risk  SUBJECTIVE: My knees and shoulders are sore due to the weather today.  My shoulder feels 40% better.    PAIN:  Are you having pain? Yes: NPRS scale: 5/10 Pain location: right shoulder Pain description: burning Aggravating factors: use Relieving factors: rest  PATIENT GOALS:  Pt would like to be able to move around better without pain.  OBJECTIVE: (objective measures completed at initial evaluation unless otherwise dated)  DIAGNOSTIC FINDINGS:  01/16/2022 CT of cervical spine:  1. No acute fracture or traumatic subluxation of the cervical spine.  2. Stable 3 mm right upper lobe nodule. No follow-up needed  if patient is low-risk.This recommendation follows the consensus statement: Guidelines for Management of Incidental Pulmonary Nodules Detected on CT Images: From the Fleischner Society 2017; Radiology 2017; 284:228-243.   PATIENT SURVEYS:  03/09/2022: FOTO 51% (projected 58% by visit 11)   COGNITION:           Overall cognitive status: Within functional limits for tasks assessed                                  SENSATION: Light touch: WFL   POSTURE: Forward head, rounded shoulders   UPPER EXTREMITY ROM:    Active ROM Right 03/09/2022 Left 03/09/2022  Shoulder flexion 115 137  Shoulder extension      Shoulder abduction 116 125  Shoulder adduction      Shoulder internal   rotation      Shoulder external rotation      Elbow flexion      Elbow extension      Wrist flexion      Wrist extension      Wrist ulnar deviation      Wrist radial deviation      Wrist pronation      Wrist supination      (Blank rows = not tested)   UPPER EXTREMITY MMT:   MMT Right 03/09/2022 Left 03/09/2022  Shoulder flexion 4 5  Shoulder extension      Shoulder abduction 4 5  Shoulder adduction      Shoulder internal rotation 4 5  Shoulder external rotation 4 4  Middle trapezius      Lower trapezius      Elbow flexion      Elbow extension      Wrist flexion      Wrist extension      Wrist ulnar deviation      Wrist radial deviation      Wrist pronation      Wrist supination      Grip strength (lbs)      (Blank rows = not tested)   SHOULDER SPECIAL TESTS:            Impingement tests: Hawkins/Kennedy impingement test: positive right            Rotator cuff assessment: Empty can test: negative   JOINT MOBILITY TESTING:  Crepetis noted in right shoulder   PALPATION:  Tender to palpation along shoulder joint   FUNCTIONAL TESTING: 03/09/2022: 5 times sit to/from stand:  18.7 seconds with UE use TUG 19.3 sec with [employer]              03/29/22: 5x sit to stand: 23 seconds (pt reports  dizziness today)    04/05/22: TUG 14.5 seconds   TODAY'S TREATMENT:  03/31/2022: Nustep level 5 x6 min with PT present to discuss status Shoulder flexion and chest press with cane with 1# 2x10 each Ambulation down PT hallway performing horizontal and vertical head turns x2 laps each Scapular stabilization with small green ball on wall with x20 CW and x20 CCW circles Seated with yellow theraband: shoulder ER, horizontal abduction, rows.  BUE 2x10 Upper trap and levator stretch to right side x20 sec each 1# weight: flexion 2x10 Bil Sit to stand with mod UE support 2x10  03/29/2022: Nustep level 5 x6 min with PT present to discuss status Shoulder flexion and chest press with cane with 1# 2x10 each Scapular stabilization with small green ball on wall with x20 CW and x20 CCW circles Seated with yellow theraband: shoulder ER, shoulder extension (standing), rows.  BUE 2x10 Upper trap and levator stretch to right side x20 sec each 1# weight: flexion 2x10 on Rt  03/25/2022: Nustep level 5 x6 min with PT present to discuss status Shoulder flexion and chest press with cane with 1# 2x10 each Ambulation down PT hallway performing horizontal and vertical head turns x2 laps each Wall wash for shoulder flexion and scaption x10 reps each Scapular stabilization with small green ball on wall with x20 CW and x20 CCW circles Seated with yellow theraband: shoulder ER, horizontal abduction, rows.  BUE 2x10 Upper trap and levator stretch to right side x20 sec each Manual therapy to right upper trap for soft tissue mobilization to promote tissue elongation and decreased pain    PATIENT EDUCATION: Education details: Issued HEP and provided  with yellow tband Person educated: Patient Education method: Explanation, Demonstration, and Handouts Education comprehension: verbalized understanding and returned demonstration     HOME EXERCISE PROGRAM: Access Code: [email or web address] URL:  [email or web address] Date: 03/25/2022 Prepared by: Shaunda Menke  Exercises - Shoulder External Rotation and Scapular Retraction with Resistance  - 1-2 x daily - 7 x weekly - 2 sets - 10 reps - Standing Shoulder Horizontal Abduction with Resistance  - 1-2 x daily - 7 x weekly - 2 sets - 10 reps - Seated Scapular Retraction  - 1-2 x daily - 7 x weekly - 2 sets - 10 reps - Seated Upper Trapezius Stretch  - 1 x daily - 7 x weekly - 1 sets - 2 reps - 20 sec hold - Seated Levator Scapulae Stretch  - 1 x daily - 7 x weekly - 1 sets - 2 reps - 20 sec hold   ASSESSMENT:   CLINICAL IMPRESSION: Pt arrived with widespread pain due to rainy weather.  Pt tolerated all activities in the clinic and PT monitored for pain and position.  Pt with improved sit to stand and gait with head turns with fewer episodes of instability.  TUG is improved form 19.3 to 14.5 seconds today and pt reports 40% reduction in Rt shoulder pain.  Pt was able to complete all exercises in clinic without increased pain today. Pt requested to skip walking with head turns due to feeling fatigued.  Pt continues to require skilled PT to progress towards goal related activities.     OBJECTIVE IMPAIRMENTS decreased balance, difficulty walking, decreased ROM, decreased strength, dizziness, increased muscle spasms, impaired UE functional use, postural dysfunction, and pain.    ACTIVITY LIMITATIONS carrying and lifting   PARTICIPATION LIMITATIONS: cleaning and driving   PERSONAL FACTORS Time since onset of injury/illness/exacerbation and 3+ comorbidities: COPD, CHF, HTN, OA  are also affecting patient's functional outcome.    REHAB POTENTIAL: Good   CLINICAL DECISION MAKING: Evolving/moderate complexity   EVALUATION COMPLEXITY: Moderate     GOALS: Goals reviewed with patient? Yes   SHORT TERM GOALS: Target date: 03/30/2022     Pt will be independent with initial HEP. Baseline: Goal status: Goal Met 03/25/2022   2.   Pt will report at least a 40% improvement in symptoms since starting PT. Baseline: 40-45%  (04/05/22) Goal status: MET     LONG TERM GOALS: Target date: 05/04/2022     Pt will be independent with advanced HEP. Baseline:  Goal status: MET (03/29/22)   2.  Pt will increase FOTO to 58% to demonstrate her improvements in functional mobility. Baseline: 51% Goal status: INITIAL   3.  Pt will report being able to fasten/unfasten her bra without increased pain. Baseline: burning pain (04/05/22) Goal status: In progress   4.  Pt will decrease time on TUG to 14 seconds or less without assistive device and good safety to decrease risk of falling. Baseline: 14.5 seconds (04/05/22) Goal status: In progress   5.  Pt will increase right shoulder strength to at least 4+/5 to allow her to carry groceries without difficulty. Baseline:  Goal status: INITIAL   6.  Pt will state no dizziness with functional tasks, including reaching during grocery shopping for at least the week prior to discharge. Baseline:  Goal status: INITIAL     PLAN: PT FREQUENCY: 2x/week   PT DURATION: 8 weeks   PLANNED INTERVENTIONS: Therapeutic exercises, Therapeutic activity, Neuromuscular re-education, Balance training, Gait training, Patient/Family education, Joint manipulation, Joint mobilization, Stair   training, Vestibular training, Aquatic Therapy, Dry Needling, Electrical stimulation, Spinal manipulation, Spinal mobilization, Cryotherapy, Moist heat, Taping, Vasopneumatic device, Traction, Ultrasound, Ionotophoresis 4mg/ml Dexamethasone, Manual therapy, and Re-evaluation   PLAN FOR NEXT SESSION: Rt shoulder and scapular strength, balance and gait      Kelly Takacs, PT 04/05/22 9:33 AM   [facility] [address], [address] [place], [place] [address] Phone # [phone] Fax [phone] 

## 2022-04-07 ENCOUNTER — Encounter: Payer: Self-pay | Admitting: Rehabilitative and Restorative Service Providers"

## 2022-04-07 ENCOUNTER — Ambulatory Visit: Payer: Medicare Other | Admitting: Rehabilitative and Restorative Service Providers"

## 2022-04-07 DIAGNOSIS — G8929 Other chronic pain: Secondary | ICD-10-CM | POA: Diagnosis not present

## 2022-04-07 DIAGNOSIS — M25511 Pain in right shoulder: Secondary | ICD-10-CM | POA: Diagnosis not present

## 2022-04-07 DIAGNOSIS — M6281 Muscle weakness (generalized): Secondary | ICD-10-CM | POA: Diagnosis not present

## 2022-04-07 DIAGNOSIS — R42 Dizziness and giddiness: Secondary | ICD-10-CM

## 2022-04-07 DIAGNOSIS — R2681 Unsteadiness on feet: Secondary | ICD-10-CM

## 2022-04-07 DIAGNOSIS — R296 Repeated falls: Secondary | ICD-10-CM | POA: Diagnosis not present

## 2022-04-07 DIAGNOSIS — R262 Difficulty in walking, not elsewhere classified: Secondary | ICD-10-CM

## 2022-04-07 NOTE — Therapy (Signed)
OUTPATIENT PHYSICAL THERAPY TREATMENT NOTE   Patient Name: Tracy [name] MRN: [id number] DOB:1945-11-15, 76 y.o., female Today's Date: 04/07/2022  PCP: [name], [name], MD  REFERRING PROVIDER: [name], [name], MD   END OF SESSION:   PT End of Session - 04/07/22 1104     Visit Number 9    Date for PT Re-Evaluation 04/29/22    Authorization Type Med A- KX modifier needed    Progress Note Due on Visit 10    PT Start Time 1100    PT Stop Time 1140    PT Time Calculation (min) 40 min    Activity Tolerance Patient tolerated treatment well    Behavior During Therapy [facility] for tasks assessed/performed                Past Medical History:  Diagnosis Date   Allergic rhinitis    Allergy    Anxiety    Arthritis    Breast cancer ([place]) 04/28/2020   rigtht breast   Bronchitis    CAD (coronary artery disease)    1/19 PCI/DES to [place] for ISR, normal EF.    Cancer ([place])    hx of precancerous cells in right breast    Cataract    Cervical dysplasia    unsure of procedure, possible "burning" in her late 42s   CHF (congestive heart failure) ([place])    Echo 06/2019: EF 55-60, elevated LVEDP, normal RV SF, mild MAC, mild MR, trivial TR   Complication of anesthesia    COPD (chronic obstructive pulmonary disease) (HCC)    early    Depression    Dyspnea    with exertion    Family history of adverse reaction to anesthesia    daughter- problems wiht n/v   GERD (gastroesophageal reflux disease)    Gout    Headache(784.0)    Heart murmur    hx of years ago    Hyperlipidemia    Hypertension    Low back pain    Menopausal syndrome    Myocardial infarct ([place]) 2007   hx of   Overactive bladder    PONV (postoperative nausea and vomiting)    Sleep apnea    Past Surgical History:  Procedure Laterality Date   ANGIOPLASTY     stent 2007   BREAST LUMPECTOMY WITH RADIOACTIVE SEED LOCALIZATION Right 04/28/2020   Procedure: RIGHT BREAST LUMPECTOMY X 2  WITH RADIOACTIVE SEED  LOCALIZATION;  Surgeon: Donnie Mesa, MD;  Location: [place];  Service: General;  Laterality: Right;  LMA   CARDIAC CATHETERIZATION     COLONOSCOPY     CORONARY STENT INTERVENTION N/A 11/06/2017   Procedure: CORONARY STENT INTERVENTION;  Surgeon: [name], MD;  Location: [place] [facility];  Service: Cardiovascular;  Laterality: N/A;   LEFT HEART CATH AND CORONARY ANGIOGRAPHY N/A 11/06/2017   Procedure: LEFT HEART CATH AND CORONARY ANGIOGRAPHY;  Surgeon: [name], MD;  Location: [place] [facility];  Service: Cardiovascular;  Laterality: N/A;   LEFT HEART CATH AND CORONARY ANGIOGRAPHY N/A 07/24/2019   Procedure: LEFT HEART CATH AND CORONARY ANGIOGRAPHY;  Surgeon: Burnell Blanks, MD;  Location: [place] [facility];  Service: Cardiovascular;  Laterality: N/A;   RIGHT/LEFT HEART CATH AND CORONARY ANGIOGRAPHY N/A 09/29/2020   Procedure: RIGHT/LEFT HEART CATH AND CORONARY ANGIOGRAPHY;  Surgeon: Belva Crome, MD;  Location: [place] [facility];  Service: Cardiovascular;  Laterality: N/A;   ROBOTIC ASSISTED BILATERAL SALPINGO OOPHERECTOMY Bilateral 11/10/2020   Procedure:  XI ROBOTIC ASSISTED BILATERAL SALPINGO OOPHORECTOMY WITH MINI LAPAROTOMY FOR DRAINAGE;  Surgeon: Lafonda Mosses, MD;  Location: [facility];  Service: Gynecology;  Laterality: Bilateral;  MINI LAP FIRST   TONSILLECTOMY     VULVECTOMY N/A 11/10/2020   Procedure: WIDE EXCISION VULVECTOMY;  Surgeon: Lafonda Mosses, MD;  Location: [facility];  Service: Gynecology;  Laterality: N/A;   Patient Active Problem List   Diagnosis Date Noted   Dizziness 01/19/2022   Frequent falls 01/19/2022   Tremor of both hands 08/16/2021   Aortic atherosclerosis ([place]) 06/15/2021   Generalized osteoarthritis of multiple sites 12/25/2020   Chronic pain disorder 12/25/2020   Pelvic mass in female    Bilateral lower extremity edema 04/13/2020   Shortness of breath    Anxiety disorder 10/29/2018   CKD (chronic kidney  disease), stage III ([place]) 06/27/2018   COPD (chronic obstructive pulmonary disease) ([place]) 05/25/2018   Obesity 03/20/2018   Dyspnea on exertion 11/06/2017   Abnormal stress test 11/06/2017   Chronic diastolic CHF (congestive heart failure) ([place]) 10/03/2017   Chronic combined systolic and diastolic heart failure ([place]) 09/04/2017   Left ventricular dysfunction 07/31/2017   Cataract, nuclear sclerotic senile, bilateral 02/01/2017   Gouty arthritis of toe of left foot 08/09/2016   Hyperuricemia 08/09/2016   Osteoarthritis (arthritis due to wear and tear of joints) 01/14/2014   Coronary artery disease 08/02/2011   Mitral regurgitation 08/02/2011   OVERACTIVE BLADDER 07/02/2010   ACUTE CYSTITIS 05/25/2010   Pure hypercholesterolemia 08/11/2009   PEDAL EDEMA 07/22/2009   GERD 04/11/2008   Essential hypertension 05/30/2007   MYOCARDIAL INFARCTION, HX OF 05/30/2007   Allergic rhinitis 05/30/2007   LOW BACK PAIN 05/30/2007    REFERRING DIAG: M25.511,G89.29 (ICD-10-CM) - Chronic right shoulder pain   THERAPY DIAG:  Chronic right shoulder pain  Muscle weakness (generalized)  Unsteadiness on feet  Difficulty in walking, not elsewhere classified  Dizziness and giddiness  Repeated falls  Rationale for Evaluation and Treatment Rehabilitation  PERTINENT HISTORY: Dizziness, CVA, Hx of falls, OA  PRECAUTIONS: Fall Risk  SUBJECTIVE: Pt reports that shoulder is feeling some better, but she has a heat patch on her shoulder.  States that her knees are still sore, but believes due to the weather.  PAIN:  Are you having pain? Yes: NPRS scale: 4/10 Pain location: right shoulder Pain description: burning Aggravating factors: use Relieving factors: rest  PATIENT GOALS:  Pt would like to be able to move around better without pain.  OBJECTIVE: (objective measures completed at initial evaluation unless otherwise dated)  DIAGNOSTIC FINDINGS:  01/16/2022 CT of cervical spine:  1. No acute  fracture or traumatic subluxation of the cervical spine.  2. Stable 3 mm right upper lobe nodule. No follow-up needed if patient is low-risk.This recommendation follows the consensus statement: Guidelines for Management of Incidental Pulmonary Nodules Detected on CT Images: From the Fleischner Society 2017; Radiology 2017; 284:228-243.   PATIENT SURVEYS:  03/09/2022: FOTO 51% (projected 58% by visit 11)   COGNITION:           Overall cognitive status: Within functional limits for tasks assessed                                  SENSATION: Light touch: WFL   POSTURE: Forward head, rounded shoulders   UPPER EXTREMITY ROM:    Active ROM Right 03/09/2022 Left 03/09/2022  Shoulder flexion 115 137  Shoulder extension  Shoulder abduction 116 125  Shoulder adduction      Shoulder internal rotation      Shoulder external rotation      Elbow flexion      Elbow extension      Wrist flexion      Wrist extension      Wrist ulnar deviation      Wrist radial deviation      Wrist pronation      Wrist supination      (Blank rows = not tested)   UPPER EXTREMITY MMT:   MMT Right 03/09/2022 Left 03/09/2022  Shoulder flexion 4 5  Shoulder extension      Shoulder abduction 4 5  Shoulder adduction      Shoulder internal rotation 4 5  Shoulder external rotation 4 4  Middle trapezius      Lower trapezius      Elbow flexion      Elbow extension      Wrist flexion      Wrist extension      Wrist ulnar deviation      Wrist radial deviation      Wrist pronation      Wrist supination      Grip strength (lbs)      (Blank rows = not tested)   SHOULDER SPECIAL TESTS:            Impingement tests: Hawkins/Kennedy impingement test: positive right            Rotator cuff assessment: Empty can test: negative   JOINT MOBILITY TESTING:  Crepetis noted in right shoulder   PALPATION:  Tender to palpation along shoulder joint   FUNCTIONAL TESTING: 03/09/2022: 5 times sit to/from stand:   18.7 seconds with UE use TUG 19.3 sec with [facility]              03/29/22: 5x sit to stand: 23 seconds (pt reports dizziness today)    04/05/22: TUG 14.5 seconds   TODAY'S TREATMENT:   04/07/2022: Nustep level 5 x6 min with PT present to discuss status Shoulder flexion and chest press with 1# barbells 2x10 each bilat Seated with yellow theraband: shoulder ER, horizontal abduction.  BUE 2x10 Standing rows and shoulder extension with red tband 2x10 with cuing for technique and posture Scapular stabilization with small green ball on wall with x20 CW and x20 CCW circles Wall wash for right flexion and abduction x15 each Standing lat pull 25# 2x8  Triceps pressdown 10# 2x10 Pallof press with green tband x10 bilat  03/31/2022: Nustep level 5 x6 min with PT present to discuss status Shoulder flexion and chest press with cane with 1# 2x10 each Ambulation down PT hallway performing horizontal and vertical head turns x2 laps each Scapular stabilization with small green ball on wall with x20 CW and x20 CCW circles Seated with yellow theraband: shoulder ER, horizontal abduction, rows.  BUE 2x10 Upper trap and levator stretch to right side x20 sec each 1# weight: flexion 2x10 Bil Sit to stand with mod UE support 2x10  03/29/2022: Nustep level 5 x6 min with PT present to discuss status Shoulder flexion and chest press with cane with 1# 2x10 each Scapular stabilization with small green ball on wall with x20 CW and x20 CCW circles Seated with yellow theraband: shoulder ER, shoulder extension (standing), rows.  BUE 2x10 Upper trap and levator stretch to right side x20 sec each 1# weight: flexion 2x10 on Rt     PATIENT EDUCATION: Education  details: Issued HEP and provided with yellow tband Person educated: Patient Education method: Explanation, Demonstration, and Handouts Education comprehension: verbalized understanding and returned demonstration     HOME EXERCISE PROGRAM: Access Code:  [email or web address] URL: [email or web address] Date: 03/25/2022 Prepared by: Shelby Dubin Irvan Tiedt  Exercises - Shoulder External Rotation and Scapular Retraction with Resistance  - 1-2 x daily - 7 x weekly - 2 sets - 10 reps - Standing Shoulder Horizontal Abduction with Resistance  - 1-2 x daily - 7 x weekly - 2 sets - 10 reps - Seated Scapular Retraction  - 1-2 x daily - 7 x weekly - 2 sets - 10 reps - Seated Upper Trapezius Stretch  - 1 x daily - 7 x weekly - 1 sets - 2 reps - 20 sec hold - Seated Levator Scapulae Stretch  - 1 x daily - 7 x weekly - 1 sets - 2 reps - 20 sec hold   ASSESSMENT:   CLINICAL IMPRESSION: Ms Valenza presents to skilled rehab with reports of continued pain secondary to rainy weather.  Pt states that her shoulder is feeling better today and she has a heating patch on it.  Pt able to progress with strengthening exercises during session today with minimal cuing for technique.  Will perform 10th visit reassessment next session to assess progress towards goals.     OBJECTIVE IMPAIRMENTS decreased balance, difficulty walking, decreased ROM, decreased strength, dizziness, increased muscle spasms, impaired UE functional use, postural dysfunction, and pain.    ACTIVITY LIMITATIONS carrying and lifting   PARTICIPATION LIMITATIONS: cleaning and driving   PERSONAL FACTORS Time since onset of injury/illness/exacerbation and 3+ comorbidities: COPD, CHF, HTN, OA  are also affecting patient's functional outcome.    REHAB POTENTIAL: Good   CLINICAL DECISION MAKING: Evolving/moderate complexity   EVALUATION COMPLEXITY: Moderate     GOALS: Goals reviewed with patient? Yes   SHORT TERM GOALS: Target date: 03/30/2022     Pt will be independent with initial HEP. Baseline: Goal status: Goal Met 03/25/2022   2.  Pt will report at least a 40% improvement in symptoms since starting PT. Baseline: 40-45%  (04/05/22) Goal status: MET     LONG TERM GOALS: Target date: 05/04/2022      Pt will be independent with advanced HEP. Baseline:  Goal status: MET (03/29/22)   2.  Pt will increase FOTO to 58% to demonstrate her improvements in functional mobility. Baseline: 51% Goal status: INITIAL   3.  Pt will report being able to fasten/unfasten her bra without increased pain. Baseline: burning pain (04/05/22) Goal status: In progress   4.  Pt will decrease time on TUG to 14 seconds or less without assistive device and good safety to decrease risk of falling. Baseline: 14.5 seconds (04/05/22) Goal status: In progress   5.  Pt will increase right shoulder strength to at least 4+/5 to allow her to carry groceries without difficulty. Baseline:  Goal status: INITIAL   6.  Pt will state no dizziness with functional tasks, including reaching during grocery shopping for at least the week prior to discharge. Baseline:  Goal status: INITIAL     PLAN: PT FREQUENCY: 2x/week   PT DURATION: 8 weeks   PLANNED INTERVENTIONS: Therapeutic exercises, Therapeutic activity, Neuromuscular re-education, Balance training, Gait training, Patient/Family education, Joint manipulation, Joint mobilization, Stair training, Vestibular training, Aquatic Therapy, Dry Needling, Electrical stimulation, Spinal manipulation, Spinal mobilization, Cryotherapy, Moist heat, Taping, Vasopneumatic device, Traction, Ultrasound, Ionotophoresis 41m/ml Dexamethasone, Manual therapy, and Re-evaluation   PLAN FOR  NEXT SESSION: Reassessment, Rt shoulder and scapular strength, balance as indicated     Juel Burrow, PT 04/07/22 11:44 AM   [facility] [address] [place], [place] [address] Phone # [phone] Fax [phone]

## 2022-04-12 ENCOUNTER — Ambulatory Visit: Payer: Medicare Other | Admitting: Rehabilitative and Restorative Service Providers"

## 2022-04-12 ENCOUNTER — Encounter: Payer: Self-pay | Admitting: Rehabilitative and Restorative Service Providers"

## 2022-04-12 ENCOUNTER — Telehealth: Payer: Self-pay | Admitting: Internal Medicine

## 2022-04-12 DIAGNOSIS — R2681 Unsteadiness on feet: Secondary | ICD-10-CM

## 2022-04-12 DIAGNOSIS — G8929 Other chronic pain: Secondary | ICD-10-CM

## 2022-04-12 DIAGNOSIS — R42 Dizziness and giddiness: Secondary | ICD-10-CM

## 2022-04-12 DIAGNOSIS — R296 Repeated falls: Secondary | ICD-10-CM

## 2022-04-12 DIAGNOSIS — M6281 Muscle weakness (generalized): Secondary | ICD-10-CM

## 2022-04-12 DIAGNOSIS — E782 Mixed hyperlipidemia: Secondary | ICD-10-CM

## 2022-04-12 DIAGNOSIS — M25511 Pain in right shoulder: Secondary | ICD-10-CM | POA: Diagnosis not present

## 2022-04-12 DIAGNOSIS — R262 Difficulty in walking, not elsewhere classified: Secondary | ICD-10-CM

## 2022-04-12 NOTE — Therapy (Signed)
OUTPATIENT PHYSICAL THERAPY TREATMENT NOTE   Patient Name: Tracy [name] MRN: [id number] DOB:1946/01/04, 76 y.o., female Today's Date: 04/12/2022  PCP: [place], [name], MD  REFERRING PROVIDER: [place], [name], MD   Progress Note Reporting Period 03/09/2022 to 04/12/2022  See note below for Objective Data and Assessment of Progress/Goals.      END OF SESSION:   PT End of Session - 04/12/22 1029     Visit Number 10    Date for PT Re-Evaluation 04/29/22    Authorization Type Med A- KX modifier needed    Progress Note Due on Visit 20    PT Start Time 1015    PT Stop Time 1055    PT Time Calculation (min) 40 min    Activity Tolerance Patient tolerated treatment well    Behavior During Therapy WFL for tasks assessed/performed                Past Medical History:  Diagnosis Date   Allergic rhinitis    Allergy    Anxiety    Arthritis    Breast cancer (HCC) 04/28/2020   rigtht breast   Bronchitis    CAD (coronary artery disease)    1/19 PCI/DES to pLCX for ISR, normal EF.    Cancer (HCC)    hx of precancerous cells in right breast    Cataract    Cervical dysplasia    unsure of procedure, possible "burning" in her late 35s   CHF (congestive heart failure) (HCC)    Echo 06/2019: EF 55-60, elevated LVEDP, normal RV SF, mild MAC, mild MR, trivial TR   Complication of anesthesia    COPD (chronic obstructive pulmonary disease) (HCC)    early    Depression    Dyspnea    with exertion    Family history of adverse reaction to anesthesia    daughter- problems wiht n/v   GERD (gastroesophageal reflux disease)    Gout    Headache(784.0)    Heart murmur    hx of years ago    Hyperlipidemia    Hypertension    Low back pain    Menopausal syndrome    Myocardial infarct (HCC) 2007   hx of   Overactive bladder    PONV (postoperative nausea and vomiting)    Sleep apnea    Past Surgical History:  Procedure Laterality Date   ANGIOPLASTY     stent 2007    BREAST LUMPECTOMY WITH RADIOACTIVE SEED LOCALIZATION Right 04/28/2020   Procedure: RIGHT BREAST LUMPECTOMY X 2  WITH RADIOACTIVE SEED LOCALIZATION;  Surgeon: Manus Rudd, MD;  Location: [facility];  Service: General;  Laterality: Right;  LMA   CARDIAC CATHETERIZATION     COLONOSCOPY     CORONARY STENT INTERVENTION N/A 11/06/2017   Procedure: CORONARY STENT INTERVENTION;  Surgeon: Yvonne Kendall, MD;  Location: [facility];  Service: Cardiovascular;  Laterality: N/A;   LEFT HEART CATH AND CORONARY ANGIOGRAPHY N/A 11/06/2017   Procedure: LEFT HEART CATH AND CORONARY ANGIOGRAPHY;  Surgeon: Yvonne Kendall, MD;  Location: [facility];  Service: Cardiovascular;  Laterality: N/A;   LEFT HEART CATH AND CORONARY ANGIOGRAPHY N/A 07/24/2019   Procedure: LEFT HEART CATH AND CORONARY ANGIOGRAPHY;  Surgeon: Kathleene Hazel, MD;  Location: [facility];  Service: Cardiovascular;  Laterality: N/A;   RIGHT/LEFT HEART CATH AND CORONARY ANGIOGRAPHY N/A 09/29/2020   Procedure: RIGHT/LEFT HEART CATH AND CORONARY ANGIOGRAPHY;  Surgeon: Lyn Records, MD;  Location: [facility];  Service: Cardiovascular;  Laterality: N/A;   ROBOTIC ASSISTED BILATERAL SALPINGO OOPHERECTOMY Bilateral 11/10/2020   Procedure: XI ROBOTIC ASSISTED BILATERAL SALPINGO OOPHORECTOMY WITH MINI LAPAROTOMY FOR DRAINAGE;  Surgeon: Carver Fila, MD;  Location: [facility];  Service: Gynecology;  Laterality: Bilateral;  MINI LAP FIRST   TONSILLECTOMY     VULVECTOMY N/A 11/10/2020   Procedure: WIDE EXCISION VULVECTOMY;  Surgeon: Carver Fila, MD;  Location: [facility];  Service: Gynecology;  Laterality: N/A;   Patient Active Problem List   Diagnosis Date Noted   Dizziness 01/19/2022   Frequent falls 01/19/2022   Tremor of both hands 08/16/2021   Aortic atherosclerosis (HCC) 06/15/2021   Generalized osteoarthritis of multiple sites 12/25/2020   Chronic pain disorder 12/25/2020   Pelvic mass  in female    Bilateral lower extremity edema 04/13/2020   Shortness of breath    Anxiety disorder 10/29/2018   CKD (chronic kidney disease), stage III (HCC) 06/27/2018   COPD (chronic obstructive pulmonary disease) (HCC) 05/25/2018   Obesity 03/20/2018   Dyspnea on exertion 11/06/2017   Abnormal stress test 11/06/2017   Chronic diastolic CHF (congestive heart failure) (HCC) 10/03/2017   Chronic combined systolic and diastolic heart failure (HCC) 09/04/2017   Left ventricular dysfunction 07/31/2017   Cataract, nuclear sclerotic senile, bilateral 02/01/2017   Gouty arthritis of toe of left foot 08/09/2016   Hyperuricemia 08/09/2016   Osteoarthritis (arthritis due to wear and tear of joints) 01/14/2014   Coronary artery disease 08/02/2011   Mitral regurgitation 08/02/2011   OVERACTIVE BLADDER 07/02/2010   ACUTE CYSTITIS 05/25/2010   Pure hypercholesterolemia 08/11/2009   PEDAL EDEMA 07/22/2009   GERD 04/11/2008   Essential hypertension 05/30/2007   MYOCARDIAL INFARCTION, HX OF 05/30/2007   Allergic rhinitis 05/30/2007   LOW BACK PAIN 05/30/2007    REFERRING DIAG: M25.511,G89.29 (ICD-10-CM) - Chronic right shoulder pain   THERAPY DIAG:  Chronic right shoulder pain  Muscle weakness (generalized)  Unsteadiness on feet  Difficulty in walking, not elsewhere classified  Dizziness and giddiness  Repeated falls  Rationale for Evaluation and Treatment Rehabilitation  PERTINENT HISTORY: Dizziness, CVA, Hx of falls, OA  PRECAUTIONS: Fall Risk  SUBJECTIVE: Pt reports that shoulder she is having some shoulder burning today.  PAIN:  Are you having pain? Yes: NPRS scale: 5/10 Pain location: right shoulder Pain description: burning Aggravating factors: use Relieving factors: rest  PATIENT GOALS:  Pt would like to be able to move around better without pain.  OBJECTIVE: (objective measures completed at initial evaluation unless otherwise dated)  DIAGNOSTIC FINDINGS:   01/16/2022 CT of cervical spine:  1. No acute fracture or traumatic subluxation of the cervical spine.  2. Stable 3 mm right upper lobe nodule. No follow-up needed if patient is low-risk.This recommendation follows the consensus statement: Guidelines for Management of Incidental Pulmonary Nodules Detected on CT Images: From the Fleischner Society 2017; Radiology 2017; 284:228-243.   PATIENT SURVEYS:  03/09/2022: FOTO 51% (projected 58% by visit 11) 04/12/2022:  FOTO 59%   COGNITION:           Overall cognitive status: Within functional limits for tasks assessed                                  SENSATION: Light touch: WFL   POSTURE: Forward head, rounded shoulders   UPPER EXTREMITY ROM:    Active ROM Right 03/09/2022 Left 03/09/2022  Shoulder flexion 115 137  Shoulder extension      Shoulder abduction 116 125  Shoulder adduction      Shoulder internal rotation      Shoulder external rotation      Elbow flexion      Elbow extension      Wrist flexion      Wrist extension      Wrist ulnar deviation      Wrist radial deviation      Wrist pronation      Wrist supination      (Blank rows = not tested)   UPPER EXTREMITY MMT:   MMT Right 03/09/2022 Right 04/12/2022 Left 03/09/2022  Shoulder flexion 4 4+ 5  Shoulder extension       Shoulder abduction 4 4 5   Shoulder adduction       Shoulder internal rotation 4 4+ 5  Shoulder external rotation 4 4 4   Middle trapezius       Lower trapezius       Elbow flexion       Elbow extension       Wrist flexion       Wrist extension       Wrist ulnar deviation       Wrist radial deviation       Wrist pronation       Wrist supination       Grip strength (lbs)       (Blank rows = not tested)   SHOULDER SPECIAL TESTS:            Impingement tests: Hawkins/Kennedy impingement test: positive right            Rotator cuff assessment: Empty can test: negative   JOINT MOBILITY TESTING:  Crepetis noted in right shoulder   PALPATION:   Tender to palpation along shoulder joint   FUNCTIONAL TESTING: 03/09/2022: 5 times sit to/from stand:  18.7 seconds with UE use TUG 19.3 sec with [facility]               03/29/22: 5x sit to stand: 23 seconds (pt reports dizziness today)    04/05/22: TUG 14.5 seconds   04/12/2022:   5 times sit to/from stand:  14.4 seconds with UE use TUG in 12.97 sec without assistive device  TODAY'S TREATMENT:   04/12/2022: Nustep level 5 x7 min with PT present to discuss status FOTO and functional testing (see above) Seated with yellow theraband: shoulder ER, horizontal abduction.  BUE 2x10 Shoulder flexion and chest press with 1# dumbbells 2x10 each bilat Standing rows 5# 2x10 Standing lat pull 25# 2x8  Triceps pressdown 10# 2x10 Pallof press with green tband x10 bilat  04/07/2022: Nustep level 5 x6 min with PT present to discuss status Shoulder flexion and chest press with 1# barbells 2x10 each bilat Seated with yellow theraband: shoulder ER, horizontal abduction.  BUE 2x10 Standing rows and shoulder extension with red tband 2x10 with cuing for technique and posture Scapular stabilization with small green ball on wall with x20 CW and x20 CCW circles Wall wash for right flexion and abduction x15 each Standing lat pull 25# 2x8  Triceps pressdown 10# 2x10 Pallof press with green tband x10 bilat  03/31/2022: Nustep level 5 x6 min with PT present to discuss status Shoulder flexion and chest press with cane with 1# 2x10 each Ambulation down PT hallway performing horizontal and vertical head turns x2 laps each Scapular stabilization with small green ball on wall with x20 CW  and x20 CCW circles Seated with yellow theraband: shoulder ER, horizontal abduction, rows.  BUE 2x10 Upper trap and levator stretch to right side x20 sec each 1# weight: flexion 2x10 Bil Sit to stand with mod UE support 2x10     PATIENT EDUCATION: Education details: Issued HEP and provided with yellow tband Person educated:  Patient Education method: Explanation, Demonstration, and Handouts Education comprehension: verbalized understanding and returned demonstration     HOME EXERCISE PROGRAM: Access Code: [email or web address] URL: [email or web address] Date: 03/25/2022 Prepared by: Clydie Braun Kadynce Bonds  Exercises - Shoulder External Rotation and Scapular Retraction with Resistance  - 1-2 x daily - 7 x weekly - 2 sets - 10 reps - Standing Shoulder Horizontal Abduction with Resistance  - 1-2 x daily - 7 x weekly - 2 sets - 10 reps - Seated Scapular Retraction  - 1-2 x daily - 7 x weekly - 2 sets - 10 reps - Seated Upper Trapezius Stretch  - 1 x daily - 7 x weekly - 1 sets - 2 reps - 20 sec hold - Seated Levator Scapulae Stretch  - 1 x daily - 7 x weekly - 1 sets - 2 reps - 20 sec hold   ASSESSMENT:   CLINICAL IMPRESSION: Ms Alejandro presents to skilled rehab with reports that she continues to have burning in her shoulder.  Pt has increased on her FOTO goal and has improved with her TUG and 5 times sit to/from stand score.  She continues to have pain, but is making progress and was able to perform lat pull today with less pain and less facial grimacing.  Pt is making progress towards functional goals and increasing on her strength.     OBJECTIVE IMPAIRMENTS decreased balance, difficulty walking, decreased ROM, decreased strength, dizziness, increased muscle spasms, impaired UE functional use, postural dysfunction, and pain.    ACTIVITY LIMITATIONS carrying and lifting   PARTICIPATION LIMITATIONS: cleaning and driving   PERSONAL FACTORS Time since onset of injury/illness/exacerbation and 3+ comorbidities: COPD, CHF, HTN, OA  are also affecting patient's functional outcome.    REHAB POTENTIAL: Good   CLINICAL DECISION MAKING: Evolving/moderate complexity   EVALUATION COMPLEXITY: Moderate     GOALS: Goals reviewed with patient? Yes   SHORT TERM GOALS: Target date: 03/30/2022     Pt will be independent with  initial HEP. Baseline: Goal status: Goal Met 03/25/2022   2.  Pt will report at least a 40% improvement in symptoms since starting PT. Baseline: 40-45%  (04/05/22) Goal status: MET     LONG TERM GOALS: Target date: 05/04/2022     Pt will be independent with advanced HEP. Baseline:  Goal status: MET (03/29/22)   2.  Pt will increase FOTO to 58% to demonstrate her improvements in functional mobility. Baseline: 51% Goal status: Goal Met on 04/12/2022   3.  Pt will report being able to fasten/unfasten her bra without increased pain. Baseline: burning pain (04/05/22) Goal status: In progress   4.  Pt will decrease time on TUG to 14 seconds or less without assistive device and good safety to decrease risk of falling. Baseline: 14.5 seconds (04/05/22) Goal status: Goal Met 04/12/2022 (12.97 sec)   5.  Pt will increase right shoulder strength to at least 4+/5 to allow her to carry groceries without difficulty. Baseline:  Goal status: Ongoing (see above)   6.  Pt will state no dizziness with functional tasks, including reaching during grocery shopping for at least the week prior to discharge. Baseline: (no reports  of dizziness during 04/12/22 session) Goal status: Ongoing     PLAN: PT FREQUENCY: 2x/week   PT DURATION: 8 weeks   PLANNED INTERVENTIONS: Therapeutic exercises, Therapeutic activity, Neuromuscular re-education, Balance training, Gait training, Patient/Family education, Joint manipulation, Joint mobilization, Stair training, Vestibular training, Aquatic Therapy, Dry Needling, Electrical stimulation, Spinal manipulation, Spinal mobilization, Cryotherapy, Moist heat, Taping, Vasopneumatic device, Traction, Ultrasound, Ionotophoresis 4mg /ml Dexamethasone, Manual therapy, and Re-evaluation   PLAN FOR NEXT SESSION: Rt shoulder and scapular strength, balance as indicated     Reather Laurence, PT 04/12/22 11:03 AM   [facility] [address] [place], [place] [address] Phone # [phone] Fax [phone]

## 2022-04-14 ENCOUNTER — Encounter: Payer: Self-pay | Admitting: Rehabilitative and Restorative Service Providers"

## 2022-04-14 ENCOUNTER — Ambulatory Visit: Payer: Medicare Other | Admitting: Rehabilitative and Restorative Service Providers"

## 2022-04-14 DIAGNOSIS — R262 Difficulty in walking, not elsewhere classified: Secondary | ICD-10-CM

## 2022-04-14 DIAGNOSIS — R2681 Unsteadiness on feet: Secondary | ICD-10-CM

## 2022-04-14 DIAGNOSIS — R296 Repeated falls: Secondary | ICD-10-CM

## 2022-04-14 DIAGNOSIS — M6281 Muscle weakness (generalized): Secondary | ICD-10-CM | POA: Diagnosis not present

## 2022-04-14 DIAGNOSIS — G8929 Other chronic pain: Secondary | ICD-10-CM

## 2022-04-14 DIAGNOSIS — R42 Dizziness and giddiness: Secondary | ICD-10-CM | POA: Diagnosis not present

## 2022-04-14 DIAGNOSIS — M25511 Pain in right shoulder: Secondary | ICD-10-CM | POA: Diagnosis not present

## 2022-04-14 NOTE — Therapy (Signed)
OUTPATIENT PHYSICAL THERAPY TREATMENT NOTE   Patient Name: Tracy [name] MRN: [id number] DOB:05-17-1946, 76 y.o., female Today's Date: 04/14/2022  PCP: [name], [name], MD  REFERRING PROVIDER: [name], [name], MD     END OF SESSION:   PT End of Session - 04/14/22 0854     Visit Number 11    Date for PT Re-Evaluation 04/29/22    Authorization Type Med A- KX modifier needed    Progress Note Due on Visit 20    PT Start Time 0845    PT Stop Time 0925    PT Time Calculation (min) 40 min    Activity Tolerance Patient tolerated treatment well    Behavior During Therapy [facility] for tasks assessed/performed                Past Medical History:  Diagnosis Date   Allergic rhinitis    Allergy    Anxiety    Arthritis    Breast cancer ([place]) 04/28/2020   rigtht breast   Bronchitis    CAD (coronary artery disease)    1/19 PCI/DES to [facility] for ISR, normal EF.    Cancer ([place])    hx of precancerous cells in right breast    Cataract    Cervical dysplasia    unsure of procedure, possible "burning" in her late 13s   CHF (congestive heart failure) ([place])    Echo 06/2019: EF 55-60, elevated LVEDP, normal RV SF, mild MAC, mild MR, trivial TR   Complication of anesthesia    COPD (chronic obstructive pulmonary disease) (H[facility])    early    Depression    Dyspnea    with exertion    Family history of adverse reaction to anesthesia    daughter- problems wiht n/v   GERD (gastroesophageal reflux disease)    Gout    Headache(784.0)    Heart murmur    hx of years ago    Hyperlipidemia    Hypertension    Low back pain    Menopausal syndrome    Myocardial infarct ([place]) 2007   hx of   Overactive bladder    PONV (postoperative nausea and vomiting)    Sleep apnea    Past Surgical History:  Procedure Laterality Date   ANGIOPLASTY     stent 2007   BREAST LUMPECTOMY WITH RADIOACTIVE SEED LOCALIZATION Right 04/28/2020   Procedure: RIGHT BREAST LUMPECTOMY X 2  WITH RADIOACTIVE SEED  LOCALIZATION;  Surgeon: Donnie Mesa, MD;  Location: [facility];  Service: General;  Laterality: Right;  LMA   CARDIAC CATHETERIZATION     COLONOSCOPY     CORONARY STENT INTERVENTION N/A 11/06/2017   Procedure: CORONARY STENT INTERVENTION;  Surgeon: [name], MD;  Location: [facility];  Service: Cardiovascular;  Laterality: N/A;   LEFT HEART CATH AND CORONARY ANGIOGRAPHY N/A 11/06/2017   Procedure: LEFT HEART CATH AND CORONARY ANGIOGRAPHY;  Surgeon: [name], MD;  Location: [place] [facility];  Service: Cardiovascular;  Laterality: N/A;   LEFT HEART CATH AND CORONARY ANGIOGRAPHY N/A 07/24/2019   Procedure: LEFT HEART CATH AND CORONARY ANGIOGRAPHY;  Surgeon: Burnell Blanks, MD;  Location: [place];  Service: Cardiovascular;  Laterality: N/A;   RIGHT/LEFT HEART CATH AND CORONARY ANGIOGRAPHY N/A 09/29/2020   Procedure: RIGHT/LEFT HEART CATH AND CORONARY ANGIOGRAPHY;  Surgeon: Belva Crome, MD;  Location: [place] [facility];  Service: Cardiovascular;  Laterality: N/A;   ROBOTIC ASSISTED BILATERAL SALPINGO OOPHERECTOMY Bilateral 11/10/2020  Procedure: XI ROBOTIC ASSISTED BILATERAL SALPINGO OOPHORECTOMY WITH MINI LAPAROTOMY FOR DRAINAGE;  Surgeon: Lafonda Mosses, MD;  Location: [facility];  Service: Gynecology;  Laterality: Bilateral;  MINI LAP FIRST   TONSILLECTOMY     VULVECTOMY N/A 11/10/2020   Procedure: WIDE EXCISION VULVECTOMY;  Surgeon: Lafonda Mosses, MD;  Location: [facility];  Service: Gynecology;  Laterality: N/A;   Patient Active Problem List   Diagnosis Date Noted   Dizziness 01/19/2022   Frequent falls 01/19/2022   Tremor of both hands 08/16/2021   Aortic atherosclerosis ([place]) 06/15/2021   Generalized osteoarthritis of multiple sites 12/25/2020   Chronic pain disorder 12/25/2020   Pelvic mass in female    Bilateral lower extremity edema 04/13/2020   Shortness of breath    Anxiety disorder 10/29/2018   CKD (chronic kidney  disease), stage III ([place]) 06/27/2018   COPD (chronic obstructive pulmonary disease) ([place]) 05/25/2018   Obesity 03/20/2018   Dyspnea on exertion 11/06/2017   Abnormal stress test 11/06/2017   Chronic diastolic CHF (congestive heart failure) ([place]) 10/03/2017   Chronic combined systolic and diastolic heart failure ([place]) 09/04/2017   Left ventricular dysfunction 07/31/2017   Cataract, nuclear sclerotic senile, bilateral 02/01/2017   Gouty arthritis of toe of left foot 08/09/2016   Hyperuricemia 08/09/2016   Osteoarthritis (arthritis due to wear and tear of joints) 01/14/2014   Coronary artery disease 08/02/2011   Mitral regurgitation 08/02/2011   OVERACTIVE BLADDER 07/02/2010   ACUTE CYSTITIS 05/25/2010   Pure hypercholesterolemia 08/11/2009   PEDAL EDEMA 07/22/2009   GERD 04/11/2008   Essential hypertension 05/30/2007   MYOCARDIAL INFARCTION, HX OF 05/30/2007   Allergic rhinitis 05/30/2007   LOW BACK PAIN 05/30/2007    REFERRING DIAG: M25.511,G89.29 (ICD-10-CM) - Chronic right shoulder pain   THERAPY DIAG:  Chronic right shoulder pain  Muscle weakness (generalized)  Unsteadiness on feet  Difficulty in walking, not elsewhere classified  Dizziness and giddiness  Repeated falls  Rationale for Evaluation and Treatment Rehabilitation  PERTINENT HISTORY: Dizziness, CVA, Hx of falls, OA  PRECAUTIONS: Fall Risk  SUBJECTIVE: Pt reports that she continues to have tenderness in her right shoulder and her shoulder sometimes hurts where her bra strap touches  PAIN:  Are you having pain? Yes: NPRS scale: 4-5/10 Pain location: right shoulder Pain description: burning Aggravating factors: use Relieving factors: rest  PATIENT GOALS:  Pt would like to be able to move around better without pain.  OBJECTIVE: (objective measures completed at initial evaluation unless otherwise dated)  DIAGNOSTIC FINDINGS:  01/16/2022 CT of cervical spine:  1. No acute fracture or traumatic  subluxation of the cervical spine.  2. Stable 3 mm right upper lobe nodule. No follow-up needed if patient is low-risk.This recommendation follows the consensus statement: Guidelines for Management of Incidental Pulmonary Nodules Detected on CT Images: From the Fleischner Society 2017; Radiology 2017; 284:228-243.   PATIENT SURVEYS:  03/09/2022: FOTO 51% (projected 58% by visit 11) 04/12/2022:  FOTO 59%   COGNITION:           Overall cognitive status: Within functional limits for tasks assessed                                  SENSATION: Light touch: WFL   POSTURE: Forward head, rounded shoulders   UPPER EXTREMITY ROM:    Active ROM Right 03/09/2022 Right 04/14/2022 Left 03/09/2022  Shoulder flexion 115 128 137  Shoulder extension  Shoulder abduction 116 125 125  Shoulder adduction       Shoulder internal rotation       Shoulder external rotation       Elbow flexion       Elbow extension       Wrist flexion       Wrist extension       Wrist ulnar deviation       Wrist radial deviation       Wrist pronation       Wrist supination       (Blank rows = not tested)   UPPER EXTREMITY MMT:   MMT Right 03/09/2022 Right 04/12/2022 Left 03/09/2022  Shoulder flexion 4 4+ 5  Shoulder extension       Shoulder abduction _0 Shoulder adduction       Shoulder internal rotation 4 4+ 5  Shoulder external rotation _1 Middle trapezius       Lower trapezius       Elbow flexion       Elbow extension       Wrist flexion       Wrist extension       Wrist ulnar deviation       Wrist radial deviation       Wrist pronation       Wrist supination       Grip strength (lbs)       (Blank rows = not tested)   SHOULDER SPECIAL TESTS:            Impingement tests: Hawkins/Kennedy impingement test: positive right            Rotator cuff assessment: Empty can test: negative   JOINT MOBILITY TESTING:  Crepetis noted in right shoulder   PALPATION:  Tender to palpation along  shoulder joint   FUNCTIONAL TESTING: 03/09/2022: 5 times sit to/from stand:  18.7 seconds with UE use [facility]UG [facility]               03/29/22: 5x sit to stand: 23 seconds (pt reports dizziness today)    04/05/22: TUG 14.5 seconds   04/12/2022:   5 times sit to/from stand:  14.4 seconds with UE use TUG in 12.97 sec without assistive device  TODAY'S TREATMENT:   04/14/2022: Nustep level 5 x6 min with PT present to discuss status Right shoulder IR towel stretch 4x10 sec Seated with red theraband: shoulder ER, horizontal abduction.  BUE 2x10 Standing rows and shoulder extension with green tband 2x10 with cuing for technique and posture Shoulder flexion and chest press with 2# dumbbells 2x10 each bilat Ambulation down hallway with SPC performing vertical and horizontal head turns x2 laps each  04/12/2022: Nustep level 5 x7 min with PT present to discuss status FOTO and functional testing (see above) Seated with yellow theraband: shoulder ER, horizontal abduction.  BUE 2x10 Shoulder flexion and chest press with 1# dumbbells 2x10 each bilat Standing rows 5# 2x10 Standing lat pull 25# 2x8  Triceps pressdown 10# 2x10 Pallof press with green tband x10 bilat  04/07/2022: Nustep level 5 x6 min with PT present to discuss status Shoulder flexion and chest press with 1# barbells 2x10 each bilat Seated with yellow theraband: shoulder ER, horizontal abduction.  BUE 2x10 Standing rows and shoulder extension with red tband 2x10 with cuing for technique and posture Scapular stabilization with small green ball on wall with x20 CW and x20 CCW circles Wall wash for  right flexion and abduction x15 each Standing lat pull 25# 2x8  Triceps pressdown 10# 2x10 Pallof press with green tband x10 bilat    PATIENT EDUCATION: Education details: Issued HEP and provided with yellow tband Person educated: Patient Education method: Explanation, Demonstration, and Handouts Education comprehension:  verbalized understanding and returned demonstration     HOME EXERCISE PROGRAM: Access Code: [email or web address] URL: [email or web address] Date: 03/25/2022 Prepared by: Shelby Dubin Zonie Crutcher  Exercises - Shoulder External Rotation and Scapular Retraction with Resistance  - 1-2 x daily - 7 x weekly - 2 sets - 10 reps - Standing Shoulder Horizontal Abduction with Resistance  - 1-2 x daily - 7 x weekly - 2 sets - 10 reps - Seated Scapular Retraction  - 1-2 x daily - 7 x weekly - 2 sets - 10 reps - Seated Upper Trapezius Stretch  - 1 x daily - 7 x weekly - 1 sets - 2 reps - 20 sec hold - Seated Levator Scapulae Stretch  - 1 x daily - 7 x weekly - 1 sets - 2 reps - 20 sec hold   ASSESSMENT:   CLINICAL IMPRESSION: Ms Bains presents to skilled rehabilitation with continued reports of shoulder pain.  Pt has increased with her right shoulder A/ROM since initial evaluation.  Pt able to progress to increased resistance with strengthening exercise during session today.  Pt with some dizziness reported, so proceeded to perform some ambulation with head turns to address.  Pt continues to require skilled PT to progress towards goal related activities.     OBJECTIVE IMPAIRMENTS decreased balance, difficulty walking, decreased ROM, decreased strength, dizziness, increased muscle spasms, impaired UE functional use, postural dysfunction, and pain.    ACTIVITY LIMITATIONS carrying and lifting   PARTICIPATION LIMITATIONS: cleaning and driving   PERSONAL FACTORS Time since onset of injury/illness/exacerbation and 3+ comorbidities: COPD, CHF, HTN, OA  are also affecting patient's functional outcome.    REHAB POTENTIAL: Good   CLINICAL DECISION MAKING: Evolving/moderate complexity   EVALUATION COMPLEXITY: Moderate     GOALS: Goals reviewed with patient? Yes   SHORT TERM GOALS: Target date: 03/30/2022     Pt will be independent with initial HEP. Baseline: Goal status: Goal Met 03/25/2022   2.  Pt will  report at least a 40% improvement in symptoms since starting PT. Baseline: 40-45%  (04/05/22) Goal status: MET     LONG TERM GOALS: Target date: 05/04/2022     Pt will be independent with advanced HEP. Baseline:  Goal status: MET (03/29/22)   2.  Pt will increase FOTO to 58% to demonstrate her improvements in functional mobility. Baseline: 51% Goal status: Goal Met on 04/12/2022   3.  Pt will report being able to fasten/unfasten her bra without increased pain. Baseline: burning pain (04/05/22) Goal status: In progress   4.  Pt will decrease time on TUG to 14 seconds or less without assistive device and good safety to decrease risk of falling. Baseline: 14.5 seconds (04/05/22) Goal status: Goal Met 04/12/2022 (12.97 sec)   5.  Pt will increase right shoulder strength to at least 4+/5 to allow her to carry groceries without difficulty. Baseline:  Goal status: Ongoing (see above)   6.  Pt will state no dizziness with functional tasks, including reaching during grocery shopping for at least the week prior to discharge. Baseline: (no reports of dizziness during 04/12/22 session) Goal status: Ongoing     PLAN: PT FREQUENCY: 2x/week   PT DURATION: 8 weeks   PLANNED INTERVENTIONS:  Therapeutic exercises, Therapeutic activity, Neuromuscular re-education, Balance training, Gait training, Patient/Family education, Joint manipulation, Joint mobilization, Stair training, Vestibular training, Aquatic Therapy, Dry Needling, Electrical stimulation, Spinal manipulation, Spinal mobilization, Cryotherapy, Moist heat, Taping, Vasopneumatic device, Traction, Ultrasound, Ionotophoresis 54m/ml Dexamethasone, Manual therapy, and Re-evaluation   PLAN FOR NEXT SESSION: Rt shoulder and scapular strength, balance as indicated     SJuel Burrow PT 04/14/22 9:32 AM   [id number] S[place] [address]Phone # [phone] [phone]

## 2022-04-17 ENCOUNTER — Other Ambulatory Visit: Payer: Self-pay | Admitting: Family Medicine

## 2022-04-17 DIAGNOSIS — M109 Gout, unspecified: Secondary | ICD-10-CM

## 2022-04-20 ENCOUNTER — Encounter: Payer: Self-pay | Admitting: Rehabilitative and Restorative Service Providers"

## 2022-04-20 ENCOUNTER — Ambulatory Visit: Payer: Medicare Other | Attending: Family Medicine | Admitting: Rehabilitative and Restorative Service Providers"

## 2022-04-20 DIAGNOSIS — R42 Dizziness and giddiness: Secondary | ICD-10-CM | POA: Diagnosis not present

## 2022-04-20 DIAGNOSIS — R262 Difficulty in walking, not elsewhere classified: Secondary | ICD-10-CM | POA: Diagnosis not present

## 2022-04-20 DIAGNOSIS — R296 Repeated falls: Secondary | ICD-10-CM | POA: Insufficient documentation

## 2022-04-20 DIAGNOSIS — M6281 Muscle weakness (generalized): Secondary | ICD-10-CM | POA: Insufficient documentation

## 2022-04-20 DIAGNOSIS — R2681 Unsteadiness on feet: Secondary | ICD-10-CM | POA: Diagnosis not present

## 2022-04-20 DIAGNOSIS — M25511 Pain in right shoulder: Secondary | ICD-10-CM | POA: Insufficient documentation

## 2022-04-20 DIAGNOSIS — G8929 Other chronic pain: Secondary | ICD-10-CM | POA: Insufficient documentation

## 2022-04-20 NOTE — Therapy (Addendum)
OUTPATIENT PHYSICAL THERAPY TREATMENT NOTE   Patient Name: Tracy [name] MRN: [id number] DOB:July 12, 1946, 76 y.o., female Today's Date: 04/20/2022  PCP: [name], [name], MD  REFERRING PROVIDER: [name], [name], MD     END OF SESSION:   PT End of Session - 04/20/22 0847     Visit Number 12    Date for PT Re-Evaluation 04/29/22    Authorization Type Med A- KX modifier needed    Progress Note Due on Visit 20    PT Start Time 0843    PT Stop Time 0925    PT Time Calculation (min) 42 min    Activity Tolerance Patient tolerated treatment well    Behavior During Therapy [facility] for tasks assessed/performed                Past Medical History:  Diagnosis Date   Allergic rhinitis    Allergy    Anxiety    Arthritis    Breast cancer ([place]) 04/28/2020   rigtht breast   Bronchitis    CAD (coronary artery disease)    1/19 PCI/DES to [facility] for ISR, normal EF.    Cancer ([place])    hx of precancerous cells in right breast    Cataract    Cervical dysplasia    unsure of procedure, possible "burning" in her late 72s   CHF (congestive heart failure) ([place])    Echo 06/2019: EF 55-60, elevated LVEDP, normal RV SF, mild MAC, mild MR, trivial TR   Complication of anesthesia    COPD (chronic obstructive pulmonary disease) (HCC)    early    Depression    Dyspnea    with exertion    Family history of adverse reaction to anesthesia    daughter- problems wiht n/v   GERD (gastroesophageal reflux disease)    Gout    Headache(784.0)    Heart murmur    hx of years ago    Hyperlipidemia    Hypertension    Low back pain    Menopausal syndrome    Myocardial infarct ([place]) 2007   hx of   Overactive bladder    PONV (postoperative nausea and vomiting)    Sleep apnea    Past Surgical History:  Procedure Laterality Date   ANGIOPLASTY     stent 2007   BREAST LUMPECTOMY WITH RADIOACTIVE SEED LOCALIZATION Right 04/28/2020   Procedure: RIGHT BREAST LUMPECTOMY X 2  WITH RADIOACTIVE SEED  LOCALIZATION;  Surgeon: Donnie Mesa, MD;  Location: [place];  Service: General;  Laterality: Right;  LMA   CARDIAC CATHETERIZATION     COLONOSCOPY     CORONARY STENT INTERVENTION N/A 11/06/2017   Procedure: CORONARY STENT INTERVENTION;  Surgeon: [name], MD;  Location: [place] [facility];  Service: Cardiovascular;  Laterality: N/A;   LEFT HEART CATH AND CORONARY ANGIOGRAPHY N/A 11/06/2017   Procedure: LEFT HEART CATH AND CORONARY ANGIOGRAPHY;  Surgeon: [name], MD;  Location: [place] [facility];  Service: Cardiovascular;  Laterality: N/A;   LEFT HEART CATH AND CORONARY ANGIOGRAPHY N/A 07/24/2019   Procedure: LEFT HEART CATH AND CORONARY ANGIOGRAPHY;  Surgeon: Burnell Blanks, MD;  Location: [place] [facility];  Service: Cardiovascular;  Laterality: N/A;   RIGHT/LEFT HEART CATH AND CORONARY ANGIOGRAPHY N/A 09/29/2020   Procedure: RIGHT/LEFT HEART CATH AND CORONARY ANGIOGRAPHY;  Surgeon: Belva Crome, MD;  Location: [place] [facility];  Service: Cardiovascular;  Laterality: N/A;   ROBOTIC ASSISTED BILATERAL SALPINGO OOPHERECTOMY Bilateral 11/10/2020  Procedure: XI ROBOTIC ASSISTED BILATERAL SALPINGO OOPHORECTOMY WITH MINI LAPAROTOMY FOR DRAINAGE;  Surgeon: Lafonda Mosses, MD;  Location: [facility];  Service: Gynecology;  Laterality: Bilateral;  MINI LAP FIRST   TONSILLECTOMY     VULVECTOMY N/A 11/10/2020   Procedure: WIDE EXCISION VULVECTOMY;  Surgeon: Lafonda Mosses, MD;  Location: [facility];  Service: Gynecology;  Laterality: N/A;   Patient Active Problem List   Diagnosis Date Noted   Dizziness 01/19/2022   Frequent falls 01/19/2022   Tremor of both hands 08/16/2021   Aortic atherosclerosis ([place]) 06/15/2021   Generalized osteoarthritis of multiple sites 12/25/2020   Chronic pain disorder 12/25/2020   Pelvic mass in female    Bilateral lower extremity edema 04/13/2020   Shortness of breath    Anxiety disorder 10/29/2018   CKD (chronic kidney  disease), stage III ([place]) 06/27/2018   COPD (chronic obstructive pulmonary disease) ([place]) 05/25/2018   Obesity 03/20/2018   Dyspnea on exertion 11/06/2017   Abnormal stress test 11/06/2017   Chronic diastolic CHF (congestive heart failure) ([place]) 10/03/2017   Chronic combined systolic and diastolic heart failure ([place]) 09/04/2017   Left ventricular dysfunction 07/31/2017   Cataract, nuclear sclerotic senile, bilateral 02/01/2017   Gouty arthritis of toe of left foot 08/09/2016   Hyperuricemia 08/09/2016   Osteoarthritis (arthritis due to wear and tear of joints) 01/14/2014   Coronary artery disease 08/02/2011   Mitral regurgitation 08/02/2011   OVERACTIVE BLADDER 07/02/2010   ACUTE CYSTITIS 05/25/2010   Pure hypercholesterolemia 08/11/2009   PEDAL EDEMA 07/22/2009   GERD 04/11/2008   Essential hypertension 05/30/2007   MYOCARDIAL INFARCTION, HX OF 05/30/2007   Allergic rhinitis 05/30/2007   LOW BACK PAIN 05/30/2007    REFERRING DIAG: M25.511,G89.29 (ICD-10-CM) - Chronic right shoulder pain   THERAPY DIAG:  Chronic right shoulder pain  Muscle weakness (generalized)  Unsteadiness on feet  Difficulty in walking, not elsewhere classified  Dizziness and giddiness  Repeated falls  Rationale for Evaluation and Treatment Rehabilitation  PERTINENT HISTORY: Dizziness, CVA, Hx of falls, OA  PRECAUTIONS: Fall Risk  SUBJECTIVE: Pt reports that she still uses heat patch on her shoulder, but is noticing increased wrist pain.  PAIN:  Are you having pain? Yes: NPRS scale: 4/10 Pain location: right shoulder Pain description: burning Aggravating factors: use Relieving factors: rest  PATIENT GOALS:  Pt would like to be able to move around better without pain.  OBJECTIVE: (objective measures completed at initial evaluation unless otherwise dated)  DIAGNOSTIC FINDINGS:  01/16/2022 CT of cervical spine:  1. No acute fracture or traumatic subluxation of the cervical spine.  2. Stable  3 mm right upper lobe nodule. No follow-up needed if patient is low-risk.This recommendation follows the consensus statement: Guidelines for Management of Incidental Pulmonary Nodules Detected on CT Images: From the Fleischner Society 2017; Radiology 2017; 284:228-243.   PATIENT SURVEYS:  03/09/2022: FOTO 51% (projected 58% by visit 11) 04/12/2022:  FOTO 59%   COGNITION:           Overall cognitive status: Within functional limits for tasks assessed                                  SENSATION: Light touch: WFL   POSTURE: Forward head, rounded shoulders   UPPER EXTREMITY ROM:    Active ROM Right 03/09/2022 Right 04/14/2022 Left 03/09/2022  Shoulder flexion 115 128 137  Shoulder extension  Shoulder abduction 116 125 125  Shoulder adduction       Shoulder internal rotation       Shoulder external rotation       Elbow flexion       Elbow extension       Wrist flexion       Wrist extension       Wrist ulnar deviation       Wrist radial deviation       Wrist pronation       Wrist supination       (Blank rows = not tested)   UPPER EXTREMITY MMT:   MMT Right 03/09/2022 Right 04/12/2022 Left 03/09/2022  Shoulder flexion 4 4+ 5  Shoulder extension       Shoulder abduction _0 Shoulder adduction       Shoulder internal rotation 4 4+ 5  Shoulder external rotation _1 Middle trapezius       Lower trapezius       Elbow flexion       Elbow extension       Wrist flexion       Wrist extension       Wrist ulnar deviation       Wrist radial deviation       Wrist pronation       Wrist supination       Grip strength (lbs)       (Blank rows = not tested)   SHOULDER SPECIAL TESTS:            Impingement tests: Hawkins/Kennedy impingement test: positive right            Rotator cuff assessment: Empty can test: negative   JOINT MOBILITY TESTING:  Crepetis noted in right shoulder   PALPATION:  Tender to palpation along shoulder joint   FUNCTIONAL  TESTING: 03/09/2022: 5 times sit to/from stand:  18.7 seconds with UE use TUG 19.3 sec with [facility]               03/29/22: 5x sit to stand: 23 seconds (pt reports dizziness today)    04/05/22: TUG 14.5 seconds   04/12/2022:   5 times sit to/from stand:  14.4 seconds with UE use TUG in 12.97 sec without assistive device  TODAY'S TREATMENT:   04/20/2022: Nustep level 5 x6 min with PT present to discuss status Right shoulder IR towel stretch 4x10 sec Seated with red theraband: shoulder ER, horizontal abduction.  BUE 2x10 Shoulder flexion and chest press with 2# dumbbells 2x10 each bilat Standing rows and shoulder extension with green tband 2x10 with cuing for technique and posture Ambulation down hallway with SPC performing vertical and horizontal head turns x2 laps each  04/14/2022: Nustep level 5 x6 min with PT present to discuss status Right shoulder IR towel stretch 4x10 sec Seated with red theraband: shoulder ER, horizontal abduction.  BUE 2x10 Standing rows and shoulder extension with green tband 2x10 with cuing for technique and posture Shoulder flexion and chest press with 2# dumbbells 2x10 each bilat Ambulation down hallway with SPC performing vertical and horizontal head turns x2 laps each  04/12/2022: Nustep level 5 x7 min with PT present to discuss status FOTO and functional testing (see above) Seated with yellow theraband: shoulder ER, horizontal abduction.  BUE 2x10 Shoulder flexion and chest press with 1# dumbbells 2x10 each bilat Standing rows 5# 2x10 Standing lat pull 25# 2x8  Triceps pressdown 10# 2x10 Pallof press with green  tband x10 bilat    PATIENT EDUCATION: Education details: Issued HEP and provided with yellow tband Person educated: Patient Education method: Explanation, Demonstration, and Handouts Education comprehension: verbalized understanding and returned demonstration     HOME EXERCISE PROGRAM: Access Code: [email or web address] URL:  [email or web address] Date: 03/25/2022 Prepared by: Shelby Dubin Zaakirah Kistner  Exercises - Shoulder External Rotation and Scapular Retraction with Resistance  - 1-2 x daily - 7 x weekly - 2 sets - 10 reps - Standing Shoulder Horizontal Abduction with Resistance  - 1-2 x daily - 7 x weekly - 2 sets - 10 reps - Seated Scapular Retraction  - 1-2 x daily - 7 x weekly - 2 sets - 10 reps - Seated Upper Trapezius Stretch  - 1 x daily - 7 x weekly - 1 sets - 2 reps - 20 sec hold - Seated Levator Scapulae Stretch  - 1 x daily - 7 x weekly - 1 sets - 2 reps - 20 sec hold   ASSESSMENT:   CLINICAL IMPRESSION: Ms Norby presents to skilled rehabilitation with continued reports of shoulder pain.  Educated pt on the possible need to get a referral to ortho for evaluation of her shoulder and wrist, as she is still having increased pain, she verbalized her understanding and states that she had considered asking for the referral.  Pt continues with some dizziness during head movements with ambulation, but is able to complete exercise without a loss of balance using her SPC.  Pt continues to require skilled PT to progress towards goal related activities.     OBJECTIVE IMPAIRMENTS decreased balance, difficulty walking, decreased ROM, decreased strength, dizziness, increased muscle spasms, impaired UE functional use, postural dysfunction, and pain.    ACTIVITY LIMITATIONS carrying and lifting   PARTICIPATION LIMITATIONS: cleaning and driving   PERSONAL FACTORS Time since onset of injury/illness/exacerbation and 3+ comorbidities: COPD, CHF, HTN, OA  are also affecting patient's functional outcome.    REHAB POTENTIAL: Good   CLINICAL DECISION MAKING: Evolving/moderate complexity   EVALUATION COMPLEXITY: Moderate     GOALS: Goals reviewed with patient? Yes   SHORT TERM GOALS: Target date: 03/30/2022     Pt will be independent with initial HEP. Baseline: Goal status: Goal Met 03/25/2022   2.  Pt will  report at least a 40% improvement in symptoms since starting PT. Baseline: 40-45%  (04/05/22) Goal status: MET     LONG TERM GOALS: Target date: 05/04/2022     Pt will be independent with advanced HEP. Baseline:  Goal status: MET (03/29/22)   2.  Pt will increase FOTO to 58% to demonstrate her improvements in functional mobility. Baseline: 51% Goal status: Goal Met on 04/12/2022   3.  Pt will report being able to fasten/unfasten her bra without increased pain. Baseline: burning pain (04/05/22) Goal status: In progress   4.  Pt will decrease time on TUG to 14 seconds or less without assistive device and good safety to decrease risk of falling. Baseline: 14.5 seconds (04/05/22) Goal status: Goal Met 04/12/2022 (12.97 sec)   5.  Pt will increase right shoulder strength to at least 4+/5 to allow her to carry groceries without difficulty. Baseline:  Goal status: Ongoing (see above)   6.  Pt will state no dizziness with functional tasks, including reaching during grocery shopping for at least the week prior to discharge. Baseline: (no reports of dizziness during 04/12/22 session) Goal status: Ongoing     PLAN: PT FREQUENCY: 2x/week   PT DURATION: 8 weeks  PLANNED INTERVENTIONS: Therapeutic exercises, Therapeutic activity, Neuromuscular re-education, Balance training, Gait training, Patient/Family education, Joint manipulation, Joint mobilization, Stair training, Vestibular training, Aquatic Therapy, Dry Needling, Electrical stimulation, Spinal manipulation, Spinal mobilization, Cryotherapy, Moist heat, Taping, Vasopneumatic device, Traction, Ultrasound, Ionotophoresis 70m/ml Dexamethasone, Manual therapy, and Re-evaluation   PLAN FOR NEXT SESSION: Rt shoulder and scapular strength, balance as indicated     SJuel Burrow PT 04/20/22 9:31 AM   [id number] SRolling ForkGWyandotte [place] [address]Phone # [phone] [phone]

## 2022-04-21 DIAGNOSIS — M25511 Pain in right shoulder: Secondary | ICD-10-CM | POA: Diagnosis not present

## 2022-04-21 DIAGNOSIS — M19011 Primary osteoarthritis, right shoulder: Secondary | ICD-10-CM | POA: Diagnosis not present

## 2022-04-22 ENCOUNTER — Ambulatory Visit: Payer: Medicare Other | Admitting: Rehabilitative and Restorative Service Providers"

## 2022-04-22 ENCOUNTER — Encounter: Payer: Self-pay | Admitting: Rehabilitative and Restorative Service Providers"

## 2022-04-22 DIAGNOSIS — M6281 Muscle weakness (generalized): Secondary | ICD-10-CM | POA: Diagnosis not present

## 2022-04-22 DIAGNOSIS — R42 Dizziness and giddiness: Secondary | ICD-10-CM | POA: Diagnosis not present

## 2022-04-22 DIAGNOSIS — R262 Difficulty in walking, not elsewhere classified: Secondary | ICD-10-CM

## 2022-04-22 DIAGNOSIS — G8929 Other chronic pain: Secondary | ICD-10-CM | POA: Diagnosis not present

## 2022-04-22 DIAGNOSIS — R296 Repeated falls: Secondary | ICD-10-CM

## 2022-04-22 DIAGNOSIS — R2681 Unsteadiness on feet: Secondary | ICD-10-CM

## 2022-04-22 DIAGNOSIS — M25511 Pain in right shoulder: Secondary | ICD-10-CM | POA: Diagnosis not present

## 2022-04-22 NOTE — Therapy (Signed)
OUTPATIENT PHYSICAL THERAPY TREATMENT NOTE   Patient Name: Tracy [name] MRN: [id number] DOB:[birth year], 76 y.o., female Today's Date: 04/22/2022  PCP: [name], [name], MD  REFERRING PROVIDER: [name], [name], MD     END OF SESSION:   PT End of Session - 04/22/22 0853     Visit Number 13    Date for PT Re-Evaluation 04/29/22    Authorization Type Med A- KX modifier needed    Progress Note Due on Visit 20    PT Start Time 0845    PT Stop Time 0925    PT Time Calculation (min) 40 min    Activity Tolerance Patient tolerated treatment well    Behavior During Therapy [facility] for tasks assessed/performed                Past Medical History:  Diagnosis Date   Allergic rhinitis    Allergy    Anxiety    Arthritis    Breast cancer ([place]) 04/28/2020   rigtht breast   Bronchitis    CAD (coronary artery disease)    1/19 PCI/DES to [place] for ISR, normal EF.    Cancer ([place])    hx of precancerous cells in right breast    Cataract    Cervical dysplasia    unsure of procedure, possible "burning" in her late 46s   CHF (congestive heart failure) ([place])    Echo 06/2019: EF 55-60, elevated LVEDP, normal RV SF, mild MAC, mild MR, trivial TR   Complication of anesthesia    COPD (chronic obstructive pulmonary disease) (HCC)    early    Depression    Dyspnea    with exertion    Family history of adverse reaction to anesthesia    daughter- problems wiht n/v   GERD (gastroesophageal reflux disease)    Gout    Headache(784.0)    Heart murmur    hx of years ago    Hyperlipidemia    Hypertension    Low back pain    Menopausal syndrome    Myocardial infarct ([place]) 2007   hx of   Overactive bladder    PONV (postoperative nausea and vomiting)    Sleep apnea    Past Surgical History:  Procedure Laterality Date   ANGIOPLASTY     stent 2007   BREAST LUMPECTOMY WITH RADIOACTIVE SEED LOCALIZATION Right 04/28/2020   Procedure: RIGHT BREAST LUMPECTOMY X 2  WITH RADIOACTIVE SEED  LOCALIZATION;  Surgeon: Donnie Mesa, MD;  Location: [place];  Service: General;  Laterality: Right;  LMA   CARDIAC CATHETERIZATION     COLONOSCOPY     CORONARY STENT INTERVENTION N/A 11/06/2017   Procedure: CORONARY STENT INTERVENTION;  Surgeon: [name], MD;  Location: [place] [facility];  Service: Cardiovascular;  Laterality: N/A;   LEFT HEART CATH AND CORONARY ANGIOGRAPHY N/A 11/06/2017   Procedure: LEFT HEART CATH AND CORONARY ANGIOGRAPHY;  Surgeon: [name], MD;  Location: [place] [facility];  Service: Cardiovascular;  Laterality: N/A;   LEFT HEART CATH AND CORONARY ANGIOGRAPHY N/A 07/24/2019   Procedure: LEFT HEART CATH AND CORONARY ANGIOGRAPHY;  Surgeon: Burnell Blanks, MD;  Location: [place] [facility];  Service: Cardiovascular;  Laterality: N/A;   RIGHT/LEFT HEART CATH AND CORONARY ANGIOGRAPHY N/A 09/29/2020   Procedure: RIGHT/LEFT HEART CATH AND CORONARY ANGIOGRAPHY;  Surgeon: Belva Crome, MD;  Location: [place] [facility];  Service: Cardiovascular;  Laterality: N/A;   ROBOTIC ASSISTED BILATERAL SALPINGO OOPHERECTOMY Bilateral 11/10/2020  Procedure: XI ROBOTIC ASSISTED BILATERAL SALPINGO OOPHORECTOMY WITH MINI LAPAROTOMY FOR DRAINAGE;  Surgeon: Lafonda Mosses, MD;  Location: [facility];  Service: Gynecology;  Laterality: Bilateral;  MINI LAP FIRST   TONSILLECTOMY     VULVECTOMY N/A 11/10/2020   Procedure: WIDE EXCISION VULVECTOMY;  Surgeon: Lafonda Mosses, MD;  Location: [facility];  Service: Gynecology;  Laterality: N/A;   Patient Active Problem List   Diagnosis Date Noted   Dizziness 01/19/2022   Frequent falls 01/19/2022   Tremor of both hands 08/16/2021   Aortic atherosclerosis ([place]) 06/15/2021   Generalized osteoarthritis of multiple sites 12/25/2020   Chronic pain disorder 12/25/2020   Pelvic mass in female    Bilateral lower extremity edema 04/13/2020   Shortness of breath    Anxiety disorder 10/29/2018   CKD (chronic kidney  disease), stage III ([place]) 06/27/2018   COPD (chronic obstructive pulmonary disease) ([place]) 05/25/2018   Obesity 03/20/2018   Dyspnea on exertion 11/06/2017   Abnormal stress test 11/06/2017   Chronic diastolic CHF (congestive heart failure) ([place]) 10/03/2017   Chronic combined systolic and diastolic heart failure ([place]) 09/04/2017   Left ventricular dysfunction 07/31/2017   Cataract, nuclear sclerotic senile, bilateral 02/01/2017   Gouty arthritis of toe of left foot 08/09/2016   Hyperuricemia 08/09/2016   Osteoarthritis (arthritis due to wear and tear of joints) 01/14/2014   Coronary artery disease 08/02/2011   Mitral regurgitation 08/02/2011   OVERACTIVE BLADDER 07/02/2010   ACUTE CYSTITIS 05/25/2010   Pure hypercholesterolemia 08/11/2009   PEDAL EDEMA 07/22/2009   GERD 04/11/2008   Essential hypertension 05/30/2007   MYOCARDIAL INFARCTION, HX OF 05/30/2007   Allergic rhinitis 05/30/2007   LOW BACK PAIN 05/30/2007    REFERRING DIAG: M25.511,G89.29 (ICD-10-CM) - Chronic right shoulder pain   THERAPY DIAG:  Chronic right shoulder pain  Muscle weakness (generalized)  Unsteadiness on feet  Difficulty in walking, not elsewhere classified  Dizziness and giddiness  Repeated falls  Rationale for Evaluation and Treatment Rehabilitation  PERTINENT HISTORY: Dizziness, CVA, Hx of falls, OA  PRECAUTIONS: Fall Risk  SUBJECTIVE: Pt reports that she still uses heat patch on her shoulder, but is noticing increased wrist pain.  PAIN:  Are you having pain? Yes: NPRS scale: 3/10 Pain location: right shoulder Pain description: burning Aggravating factors: use Relieving factors: rest  PATIENT GOALS:  Pt would like to be able to move around better without pain.  OBJECTIVE: (objective measures completed at initial evaluation unless otherwise dated)  DIAGNOSTIC FINDINGS:  01/16/2022 CT of cervical spine:  1. No acute fracture or traumatic subluxation of the cervical spine.  2. Stable  3 mm right upper lobe nodule. No follow-up needed if patient is low-risk.This recommendation follows the consensus statement: Guidelines for Management of Incidental Pulmonary Nodules Detected on CT Images: From the Fleischner Society 2017; Radiology 2017; 284:228-243.   PATIENT SURVEYS:  03/09/2022: FOTO 51% (projected 58% by visit 11) 04/12/2022:  FOTO 59%   COGNITION:           Overall cognitive status: Within functional limits for tasks assessed                                  SENSATION: Light touch: WFL   POSTURE: Forward head, rounded shoulders   UPPER EXTREMITY ROM:    Active ROM Right 03/09/2022 Right 04/14/2022 Left 03/09/2022  Shoulder flexion 115 128 137  Shoulder extension  Shoulder abduction 116 125 125  Shoulder adduction       Shoulder internal rotation       Shoulder external rotation       Elbow flexion       Elbow extension       Wrist flexion       Wrist extension       Wrist ulnar deviation       Wrist radial deviation       Wrist pronation       Wrist supination       (Blank rows = not tested)   UPPER EXTREMITY MMT:   MMT Right 03/09/2022 Right 04/12/2022 Left 03/09/2022  Shoulder flexion 4 4+ 5  Shoulder extension       Shoulder abduction _0 Shoulder adduction       Shoulder internal rotation 4 4+ 5  Shoulder external rotation _1 Middle trapezius       Lower trapezius       Elbow flexion       Elbow extension       Wrist flexion       Wrist extension       Wrist ulnar deviation       Wrist radial deviation       Wrist pronation       Wrist supination       Grip strength (lbs)       (Blank rows = not tested)   SHOULDER SPECIAL TESTS:            Impingement tests: Hawkins/Kennedy impingement test: positive right            Rotator cuff assessment: Empty can test: negative   JOINT MOBILITY TESTING:  Crepetis noted in right shoulder   PALPATION:  Tender to palpation along shoulder joint   FUNCTIONAL  TESTING: 03/09/2022: 5 times sit to/from stand:  18.7 seconds with UE use TUG 19.3 sec with [facility]               03/29/22: 5x sit to stand: 23 seconds (pt reports dizziness today)    04/05/22: TUG 14.5 seconds   04/12/2022:   5 times sit to/from stand:  14.4 seconds with UE use TUG in 12.97 sec without assistive device  TODAY'S TREATMENT:  04/22/2022: Nustep level 5 x6 min with PT present to discuss status Right shoulder IR towel stretch 4x10 sec Seated with red theraband: shoulder ER, horizontal abduction.  BUE 2x10 Seated with red theraband:  shoulder diagonals BUE 2x10 each direction Standing rows and shoulder extension with green tband 2x10 with cuing for technique and posture Shoulder flexion and chest press with 2# dumbbells 2x10 each bilat  04/20/2022: Nustep level 5 x6 min with PT present to discuss status Right shoulder IR towel stretch 4x10 sec Seated with red theraband: shoulder ER, horizontal abduction.  BUE 2x10 Shoulder flexion and chest press with 2# dumbbells 2x10 each bilat Standing rows and shoulder extension with green tband 2x10 with cuing for technique and posture Ambulation down hallway with [facility] performing vertical and horizontal head turns x2 laps each  04/14/2022: Nustep level 5 x6 min with PT present to discuss status Right shoulder IR towel stretch 4x10 sec Seated with red theraband: shoulder ER, horizontal abduction.  BUE 2x10 Standing rows and shoulder extension with green tband 2x10 with cuing for technique and posture Shoulder flexion and chest press with 2# dumbbells 2x10 each bilat Ambulation down hallway with [facility] performing  vertical and horizontal head turns x2 laps each    PATIENT EDUCATION: Education details: Issued HEP and provided with yellow tband Person educated: Patient Education method: Explanation, Demonstration, and Handouts Education comprehension: verbalized understanding and returned demonstration     HOME EXERCISE PROGRAM: Access Code:  [email or web address] URL: [email or web address] Date: 03/25/2022 Prepared by: Shelby Dubin Ruta Capece  Exercises - Shoulder External Rotation and Scapular Retraction with Resistance  - 1-2 x daily - 7 x weekly - 2 sets - 10 reps - Standing Shoulder Horizontal Abduction with Resistance  - 1-2 x daily - 7 x weekly - 2 sets - 10 reps - Seated Scapular Retraction  - 1-2 x daily - 7 x weekly - 2 sets - 10 reps - Seated Upper Trapezius Stretch  - 1 x daily - 7 x weekly - 1 sets - 2 reps - 20 sec hold - Seated Levator Scapulae Stretch  - 1 x daily - 7 x weekly - 1 sets - 2 reps - 20 sec hold   ASSESSMENT:   CLINICAL IMPRESSION: Ms Boyett presents to skilled rehabilitation with continued reports of shoulder pain, but reports some improvement since injection yesterday.  Pt received a shoulder injection of Marcaine and Kenalog. Jonelle Sidle, PA yesterday and with diagnosis of shoulder OA. Pt able to tolerate session without any complaints of increased pain.  Pt continues to require skilled PT to progress towards goal related activities.     OBJECTIVE IMPAIRMENTS decreased balance, difficulty walking, decreased ROM, decreased strength, dizziness, increased muscle spasms, impaired UE functional use, postural dysfunction, and pain.    ACTIVITY LIMITATIONS carrying and lifting   PARTICIPATION LIMITATIONS: cleaning and driving   PERSONAL FACTORS Time since onset of injury/illness/exacerbation and 3+ comorbidities: COPD, CHF, HTN, OA  are also affecting patient's functional outcome.    REHAB POTENTIAL: Good   CLINICAL DECISION MAKING: Evolving/moderate complexity   EVALUATION COMPLEXITY: Moderate     GOALS: Goals reviewed with patient? Yes   SHORT TERM GOALS: Target date: 03/30/2022     Pt will be independent with initial HEP. Baseline: Goal status: Goal Met 03/25/2022   2.  Pt will report at least a 40% improvement in symptoms since starting PT. Baseline: 40-45%  (04/05/22) Goal status: MET      LONG TERM GOALS: Target date: 05/04/2022     Pt will be independent with advanced HEP. Baseline:  Goal status: MET (03/29/22)   2.  Pt will increase FOTO to 58% to demonstrate her improvements in functional mobility. Baseline: 51% Goal status: Goal Met on 04/12/2022   3.  Pt will report being able to fasten/unfasten her bra without increased pain. Baseline: burning pain (04/05/22) Goal status: In progress   4.  Pt will decrease time on TUG to 14 seconds or less without assistive device and good safety to decrease risk of falling. Baseline: 14.5 seconds (04/05/22) Goal status: Goal Met 04/12/2022 (12.97 sec)   5.  Pt will increase right shoulder strength to at least 4+/5 to allow her to carry groceries without difficulty. Baseline:  Goal status: Ongoing (see above)   6.  Pt will state no dizziness with functional tasks, including reaching during grocery shopping for at least the week prior to discharge. Baseline: (no reports of dizziness during 04/12/22 session) Goal status: Ongoing     PLAN: PT FREQUENCY: 2x/week   PT DURATION: 8 weeks   PLANNED INTERVENTIONS: Therapeutic exercises, Therapeutic activity, Neuromuscular re-education, Balance training, Gait training, Patient/Family education, Joint manipulation, Joint mobilization, Stair training, Vestibular training,  Aquatic Therapy, Dry Needling, Electrical stimulation, Spinal manipulation, Spinal mobilization, Cryotherapy, Moist heat, Taping, Vasopneumatic device, Traction, Ultrasound, Ionotophoresis 4m/ml Dexamethasone, Manual therapy, and Re-evaluation   PLAN FOR NEXT SESSION: Assess how shoulder is feeling after injection on 04/21/22, Rt shoulder and scapular strength, balance as indicated     SJuel Burrow PT 04/22/22 9:31 AM   [place] [id number] [place] [address]Phone # [phone] [phone]

## 2022-04-26 ENCOUNTER — Ambulatory Visit: Payer: Medicare Other | Admitting: Rehabilitative and Restorative Service Providers"

## 2022-04-26 ENCOUNTER — Encounter: Payer: Self-pay | Admitting: Rehabilitative and Restorative Service Providers"

## 2022-04-26 DIAGNOSIS — R42 Dizziness and giddiness: Secondary | ICD-10-CM

## 2022-04-26 DIAGNOSIS — R2681 Unsteadiness on feet: Secondary | ICD-10-CM

## 2022-04-26 DIAGNOSIS — R296 Repeated falls: Secondary | ICD-10-CM

## 2022-04-26 DIAGNOSIS — G8929 Other chronic pain: Secondary | ICD-10-CM | POA: Diagnosis not present

## 2022-04-26 DIAGNOSIS — M6281 Muscle weakness (generalized): Secondary | ICD-10-CM

## 2022-04-26 DIAGNOSIS — R262 Difficulty in walking, not elsewhere classified: Secondary | ICD-10-CM

## 2022-04-26 DIAGNOSIS — M25511 Pain in right shoulder: Secondary | ICD-10-CM | POA: Diagnosis not present

## 2022-04-26 NOTE — Therapy (Signed)
OUTPATIENT PHYSICAL THERAPY TREATMENT NOTE   Patient Name: Tracy [name] MRN: [id number] DOB:Mar 27, 1946, 76 y.o., female Today's Date: 04/26/2022  PCP: [name], [name], MD  REFERRING PROVIDER: [name], [name], MD     END OF SESSION:   PT End of Session - 04/26/22 0848     Visit Number 14    Date for PT Re-Evaluation 04/29/22    Authorization Type Med A- KX modifier needed    Progress Note Due on Visit 20    PT Start Time 0845    PT Stop Time 0925    PT Time Calculation (min) 40 min    Activity Tolerance Patient tolerated treatment well    Behavior During Therapy [facility] for tasks assessed/performed                Past Medical History:  Diagnosis Date   Allergic rhinitis    Allergy    Anxiety    Arthritis    Breast cancer ([place]) 04/28/2020   rigtht breast   Bronchitis    CAD (coronary artery disease)    1/19 PCI/DES to [place] for ISR, normal EF.    Cancer ([place])    hx of precancerous cells in right breast    Cataract    Cervical dysplasia    unsure of procedure, possible "burning" in her late 85s   CHF (congestive heart failure) ([place])    Echo 06/2019: EF 55-60, elevated LVEDP, normal RV SF, mild MAC, mild MR, trivial TR   Complication of anesthesia    COPD (chronic obstructive pulmonary disease) (H[facility])    early    Depression    Dyspnea    with exertion    Family history of adverse reaction to anesthesia    daughter- problems wiht n/v   GERD (gastroesophageal reflux disease)    Gout    Headache(784.0)    Heart murmur    hx of years ago    Hyperlipidemia    Hypertension    Low back pain    Menopausal syndrome    Myocardial infarct ([place]) 2007   hx of   Overactive bladder    PONV (postoperative nausea and vomiting)    Sleep apnea    Past Surgical History:  Procedure Laterality Date   ANGIOPLASTY     stent 2007   BREAST LUMPECTOMY WITH RADIOACTIVE SEED LOCALIZATION Right 04/28/2020   Procedure: RIGHT BREAST LUMPECTOMY X 2  WITH RADIOACTIVE SEED  LOCALIZATION;  Surgeon: Donnie Mesa, MD;  Location: [facility];  Service: General;  Laterality: Right;  LMA   CARDIAC CATHETERIZATION     COLONOSCOPY     CORONARY STENT INTERVENTION N/A 11/06/2017   Procedure: CORONARY STENT INTERVENTION;  Surgeon: [name], MD;  Location: [facility];  Service: Cardiovascular;  Laterality: N/A;   LEFT HEART CATH AND CORONARY ANGIOGRAPHY N/A 11/06/2017   Procedure: LEFT HEART CATH AND CORONARY ANGIOGRAPHY;  Surgeon: [name], MD;  Location: [facility];  Service: Cardiovascular;  Laterality: N/A;   LEFT HEART CATH AND CORONARY ANGIOGRAPHY N/A 07/24/2019   Procedure: LEFT HEART CATH AND CORONARY ANGIOGRAPHY;  Surgeon: Burnell Blanks, MD;  Location: [facility];  Service: Cardiovascular;  Laterality: N/A;   RIGHT/LEFT HEART CATH AND CORONARY ANGIOGRAPHY N/A 09/29/2020   Procedure: RIGHT/LEFT HEART CATH AND CORONARY ANGIOGRAPHY;  Surgeon: Belva Crome, MD;  Location: [facility];  Service: Cardiovascular;  Laterality: N/A;   ROBOTIC ASSISTED BILATERAL SALPINGO OOPHERECTOMY Bilateral 11/10/2020  Procedure: XI ROBOTIC ASSISTED BILATERAL SALPINGO OOPHORECTOMY WITH MINI LAPAROTOMY FOR DRAINAGE;  Surgeon: Lafonda Mosses, MD;  Location: [facility];  Service: Gynecology;  Laterality: Bilateral;  MINI LAP FIRST   TONSILLECTOMY     VULVECTOMY N/A 11/10/2020   Procedure: WIDE EXCISION VULVECTOMY;  Surgeon: Lafonda Mosses, MD;  Location: [facility];  Service: Gynecology;  Laterality: N/A;   Patient Active Problem List   Diagnosis Date Noted   Dizziness 01/19/2022   Frequent falls 01/19/2022   Tremor of both hands 08/16/2021   Aortic atherosclerosis ([place]) 06/15/2021   Generalized osteoarthritis of multiple sites 12/25/2020   Chronic pain disorder 12/25/2020   Pelvic mass in female    Bilateral lower extremity edema 04/13/2020   Shortness of breath    Anxiety disorder 10/29/2018   CKD (chronic kidney  disease), stage III ([place]) 06/27/2018   COPD (chronic obstructive pulmonary disease) ([place]) 05/25/2018   Obesity 03/20/2018   Dyspnea on exertion 11/06/2017   Abnormal stress test 11/06/2017   Chronic diastolic CHF (congestive heart failure) ([place]) 10/03/2017   Chronic combined systolic and diastolic heart failure ([place]) 09/04/2017   Left ventricular dysfunction 07/31/2017   Cataract, nuclear sclerotic senile, bilateral 02/01/2017   Gouty arthritis of toe of left foot 08/09/2016   Hyperuricemia 08/09/2016   Osteoarthritis (arthritis due to wear and tear of joints) 01/14/2014   Coronary artery disease 08/02/2011   Mitral regurgitation 08/02/2011   OVERACTIVE BLADDER 07/02/2010   ACUTE CYSTITIS 05/25/2010   Pure hypercholesterolemia 08/11/2009   PEDAL EDEMA 07/22/2009   GERD 04/11/2008   Essential hypertension 05/30/2007   MYOCARDIAL INFARCTION, HX OF 05/30/2007   Allergic rhinitis 05/30/2007   LOW BACK PAIN 05/30/2007    REFERRING DIAG: M25.511,G89.29 (ICD-10-CM) - Chronic right shoulder pain   THERAPY DIAG:  Chronic right shoulder pain  Muscle weakness (generalized)  Unsteadiness on feet  Difficulty in walking, not elsewhere classified  Dizziness and giddiness  Repeated falls  Rationale for Evaluation and Treatment Rehabilitation  PERTINENT HISTORY: Dizziness, CVA, Hx of falls, OA  PRECAUTIONS: Fall Risk  SUBJECTIVE: Pt states that she had the injection in her shoulder, but is still having the same level of pain as prior to the injection.  Pt states that her dizziness seems to starts after she takes her medication in the morning.  PAIN:  Are you having pain? Yes: NPRS scale: 4/10 Pain location: right shoulder Pain description: burning Aggravating factors: use Relieving factors: rest  PATIENT GOALS:  Pt would like to be able to move around better without pain.  OBJECTIVE: (objective measures completed at initial evaluation unless otherwise dated)  DIAGNOSTIC  FINDINGS:  01/16/2022 CT of cervical spine:  1. No acute fracture or traumatic subluxation of the cervical spine.  2. Stable 3 mm right upper lobe nodule. No follow-up needed if patient is low-risk.This recommendation follows the consensus statement: Guidelines for Management of Incidental Pulmonary Nodules Detected on CT Images: From the Fleischner Society 2017; Radiology 2017; 284:228-243.   PATIENT SURVEYS:  03/09/2022: FOTO 51% (projected 58% by visit 11) 04/12/2022:  FOTO 59%   COGNITION:           Overall cognitive status: Within functional limits for tasks assessed                                  SENSATION: Light touch: WFL   POSTURE: Forward head, rounded shoulders   UPPER EXTREMITY ROM:  Active ROM Right 03/09/2022 Right 04/14/2022 Left 03/09/2022  Shoulder flexion 115 128 137  Shoulder extension       Shoulder abduction 116 125 125  Shoulder adduction       Shoulder internal rotation       Shoulder external rotation       Elbow flexion       Elbow extension       Wrist flexion       Wrist extension       Wrist ulnar deviation       Wrist radial deviation       Wrist pronation       Wrist supination       (Blank rows = not tested)   UPPER EXTREMITY MMT:   MMT Right 03/09/2022 Right 04/12/2022 Left 03/09/2022  Shoulder flexion 4 4+ 5  Shoulder extension       Shoulder abduction _0 Shoulder adduction       Shoulder internal rotation 4 4+ 5  Shoulder external rotation _1 Middle trapezius       Lower trapezius       Elbow flexion       Elbow extension       Wrist flexion       Wrist extension       Wrist ulnar deviation       Wrist radial deviation       Wrist pronation       Wrist supination       Grip strength (lbs)       (Blank rows = not tested)   SHOULDER SPECIAL TESTS:            Impingement tests: Hawkins/Kennedy impingement test: positive right            Rotator cuff assessment: Empty can test: negative   JOINT MOBILITY TESTING:   Crepetis noted in right shoulder   PALPATION:  Tender to palpation along shoulder joint   FUNCTIONAL TESTING: 03/09/2022: 5 times sit to/from stand:  18.7 seconds with UE use TUG 19.3 sec with [facility]               03/29/22: 5x sit to stand: 23 seconds (pt reports dizziness today)    04/05/22: TUG 14.5 seconds   04/12/2022:   5 times sit to/from stand:  14.4 seconds with UE use TUG in 12.97 sec without assistive device  TODAY'S TREATMENT:  04/26/2022: Nustep level 5 x6 min with PT present to discuss status Seated with red theraband: shoulder ER, horizontal abduction.  BUE 2x10 Seated with red theraband:  shoulder diagonals BUE 2x10 each direction Standing rows and shoulder extension with green tband 2x10 with cuing for technique and posture Shoulder flexion and chest press with 2# dumbbells 2x10 each bilat Right shoulder IR towel stretch 4x10 sec Standing on foam performing vertical and horizontal head turns 2x1 min each  04/22/2022: Nustep level 5 x6 min with PT present to discuss status Right shoulder IR towel stretch 4x10 sec Seated with red theraband: shoulder ER, horizontal abduction.  BUE 2x10 Seated with red theraband:  shoulder diagonals BUE 2x10 each direction Standing rows and shoulder extension with green tband 2x10 with cuing for technique and posture Shoulder flexion and chest press with 2# dumbbells 2x10 each bilat  04/20/2022: Nustep level 5 x6 min with PT present to discuss status Right shoulder IR towel stretch 4x10 sec Seated with red theraband: shoulder ER, horizontal abduction.  BUE 2x10  Shoulder flexion and chest press with 2# dumbbells 2x10 each bilat Standing rows and shoulder extension with green tband 2x10 with cuing for technique and posture Ambulation down hallway with SPC performing vertical and horizontal head turns x2 laps each    PATIENT EDUCATION: Education details: Issued HEP and provided with yellow tband Person educated: Patient Education method:  Explanation, Demonstration, and Handouts Education comprehension: verbalized understanding and returned demonstration     HOME EXERCISE PROGRAM: Access Code: [email or web address] URL: [email or web address] Date: 03/25/2022 Prepared by: Shelby Dubin Kirbie Stodghill  Exercises - Shoulder External Rotation and Scapular Retraction with Resistance  - 1-2 x daily - 7 x weekly - 2 sets - 10 reps - Standing Shoulder Horizontal Abduction with Resistance  - 1-2 x daily - 7 x weekly - 2 sets - 10 reps - Seated Scapular Retraction  - 1-2 x daily - 7 x weekly - 2 sets - 10 reps - Seated Upper Trapezius Stretch  - 1 x daily - 7 x weekly - 1 sets - 2 reps - 20 sec hold - Seated Levator Scapulae Stretch  - 1 x daily - 7 x weekly - 1 sets - 2 reps - 20 sec hold   ASSESSMENT:   CLINICAL IMPRESSION: Ms Apodaca presents to skilled rehabilitation with continued reports of shoulder pain, despite having injection last week.  Pt able to tolerate session with some shoulder fatigue noted.  Pt continues to require cuing for technique with shoulder rows and with new exercise of scapular stabilization on wall.  Pt will have reassessment next week to assessment and if goals need updated.  Pt continues to require skilled PT to progress.     OBJECTIVE IMPAIRMENTS decreased balance, difficulty walking, decreased ROM, decreased strength, dizziness, increased muscle spasms, impaired UE functional use, postural dysfunction, and pain.    ACTIVITY LIMITATIONS carrying and lifting   PARTICIPATION LIMITATIONS: cleaning and driving   PERSONAL FACTORS Time since onset of injury/illness/exacerbation and 3+ comorbidities: COPD, CHF, HTN, OA  are also affecting patient's functional outcome.    REHAB POTENTIAL: Good   CLINICAL DECISION MAKING: Evolving/moderate complexity   EVALUATION COMPLEXITY: Moderate     GOALS: Goals reviewed with patient? Yes   SHORT TERM GOALS: Target date: 03/30/2022     Pt will be independent with initial  HEP. Baseline: Goal status: Goal Met 03/25/2022   2.  Pt will report at least a 40% improvement in symptoms since starting PT. Baseline: 40-45%  (04/05/22) Goal status: MET     LONG TERM GOALS: Target date: 05/04/2022     Pt will be independent with advanced HEP. Baseline:  Goal status: MET (03/29/22)   2.  Pt will increase FOTO to 58% to demonstrate her improvements in functional mobility. Baseline: 51% Goal status: Goal Met on 04/12/2022   3.  Pt will report being able to fasten/unfasten her bra without increased pain. Baseline: burning pain (04/05/22) Goal status: In progress   4.  Pt will decrease time on TUG to 14 seconds or less without assistive device and good safety to decrease risk of falling. Baseline: 14.5 seconds (04/05/22) Goal status: Goal Met 04/12/2022 (12.97 sec)   5.  Pt will increase right shoulder strength to at least 4+/5 to allow her to carry groceries without difficulty. Baseline:  Goal status: Ongoing (see above)   6.  Pt will state no dizziness with functional tasks, including reaching during grocery shopping for at least the week prior to discharge. Baseline: (no reports of dizziness during 04/12/22 session) Goal  status: Ongoing     PLAN: PT FREQUENCY: 2x/week   PT DURATION: 8 weeks   PLANNED INTERVENTIONS: Therapeutic exercises, Therapeutic activity, Neuromuscular re-education, Balance training, Gait training, Patient/Family education, Joint manipulation, Joint mobilization, Stair training, Vestibular training, Aquatic Therapy, Dry Needling, Electrical stimulation, Spinal manipulation, Spinal mobilization, Cryotherapy, Moist heat, Taping, Vasopneumatic device, Traction, Ultrasound, Ionotophoresis 60m/ml Dexamethasone, Manual therapy, and Re-evaluation   PLAN FOR NEXT SESSION: Reassessment for additional PT, Rt shoulder and scapular strength, balance as indicated     SJuel Burrow PT 04/26/22 9:29 AM   [address] [place] [address]Phone # [phone] [phone]

## 2022-04-28 ENCOUNTER — Encounter: Payer: Self-pay | Admitting: Rehabilitative and Restorative Service Providers"

## 2022-04-28 ENCOUNTER — Ambulatory Visit: Payer: Medicare Other | Admitting: Rehabilitative and Restorative Service Providers"

## 2022-04-28 DIAGNOSIS — R42 Dizziness and giddiness: Secondary | ICD-10-CM | POA: Diagnosis not present

## 2022-04-28 DIAGNOSIS — R262 Difficulty in walking, not elsewhere classified: Secondary | ICD-10-CM

## 2022-04-28 DIAGNOSIS — M6281 Muscle weakness (generalized): Secondary | ICD-10-CM

## 2022-04-28 DIAGNOSIS — G8929 Other chronic pain: Secondary | ICD-10-CM

## 2022-04-28 DIAGNOSIS — R2681 Unsteadiness on feet: Secondary | ICD-10-CM | POA: Diagnosis not present

## 2022-04-28 DIAGNOSIS — M25511 Pain in right shoulder: Secondary | ICD-10-CM | POA: Diagnosis not present

## 2022-04-28 DIAGNOSIS — R296 Repeated falls: Secondary | ICD-10-CM

## 2022-04-28 NOTE — Therapy (Signed)
OUTPATIENT PHYSICAL THERAPY REASSESSMENT NOTE   Patient Name: Tracy [name] MRN: [id number] DOB:[birth year], 76 y.o., female Today's Date: 04/28/2022  PCP: [name], [name], MD  REFERRING PROVIDER: [name], [name], MD     END OF SESSION:   PT End of Session - 04/28/22 0852     Visit Number 15    Date for PT Re-Evaluation 04/29/22    Authorization Type Med A- KX modifier needed    PT Start Time 0845    PT Stop Time 0925    PT Time Calculation (min) 40 min    Activity Tolerance Patient tolerated treatment well    Behavior During Therapy [facility] for tasks assessed/performed                Past Medical History:  Diagnosis Date   Allergic rhinitis    Allergy    Anxiety    Arthritis    Breast cancer ([place]) 04/28/2020   rigtht breast   Bronchitis    CAD (coronary artery disease)    1/19 PCI/DES to [place] for ISR, normal EF.    Cancer ([place])    hx of precancerous cells in right breast    Cataract    Cervical dysplasia    unsure of procedure, possible "burning" in her late 16s   CHF (congestive heart failure) ([place])    Echo 06/2019: EF 55-60, elevated LVEDP, normal RV SF, mild MAC, mild MR, trivial TR   Complication of anesthesia    COPD (chronic obstructive pulmonary disease) (HCC)    early    Depression    Dyspnea    with exertion    Family history of adverse reaction to anesthesia    daughter- problems wiht n/v   GERD (gastroesophageal reflux disease)    Gout    Headache(784.0)    Heart murmur    hx of years ago    Hyperlipidemia    Hypertension    Low back pain    Menopausal syndrome    Myocardial infarct ([place]) 2007   hx of   Overactive bladder    PONV (postoperative nausea and vomiting)    Sleep apnea    Past Surgical History:  Procedure Laterality Date   ANGIOPLASTY     stent 2007   BREAST LUMPECTOMY WITH RADIOACTIVE SEED LOCALIZATION Right 04/28/2020   Procedure: RIGHT BREAST LUMPECTOMY X 2  WITH RADIOACTIVE SEED LOCALIZATION;  Surgeon: Donnie Mesa, MD;  Location: [place];  Service: General;  Laterality: Right;  LMA   CARDIAC CATHETERIZATION     COLONOSCOPY     CORONARY STENT INTERVENTION N/A 11/06/2017   Procedure: CORONARY STENT INTERVENTION;  Surgeon: [name], MD;  Location: [place] [facility];  Service: Cardiovascular;  Laterality: N/A;   LEFT HEART CATH AND CORONARY ANGIOGRAPHY N/A 11/06/2017   Procedure: LEFT HEART CATH AND CORONARY ANGIOGRAPHY;  Surgeon: [name], MD;  Location: [place] [facility];  Service: Cardiovascular;  Laterality: N/A;   LEFT HEART CATH AND CORONARY ANGIOGRAPHY N/A 07/24/2019   Procedure: LEFT HEART CATH AND CORONARY ANGIOGRAPHY;  Surgeon: Burnell Blanks, MD;  Location: [place] [facility];  Service: Cardiovascular;  Laterality: N/A;   RIGHT/LEFT HEART CATH AND CORONARY ANGIOGRAPHY N/A 09/29/2020   Procedure: RIGHT/LEFT HEART CATH AND CORONARY ANGIOGRAPHY;  Surgeon: Belva Crome, MD;  Location: [place] [facility];  Service: Cardiovascular;  Laterality: N/A;   ROBOTIC ASSISTED BILATERAL SALPINGO OOPHERECTOMY Bilateral 11/10/2020   Procedure: XI ROBOTIC ASSISTED BILATERAL SALPINGO OOPHORECTOMY WITH  MINI LAPAROTOMY FOR DRAINAGE;  Surgeon: Lafonda Mosses, MD;  Location: [facility];  Service: Gynecology;  Laterality: Bilateral;  MINI LAP FIRST   TONSILLECTOMY     VULVECTOMY N/A 11/10/2020   Procedure: WIDE EXCISION VULVECTOMY;  Surgeon: Lafonda Mosses, MD;  Location: [facility];  Service: Gynecology;  Laterality: N/A;   Patient Active Problem List   Diagnosis Date Noted   Dizziness 01/19/2022   Frequent falls 01/19/2022   Tremor of both hands 08/16/2021   Aortic atherosclerosis ([place]) 06/15/2021   Generalized osteoarthritis of multiple sites 12/25/2020   Chronic pain disorder 12/25/2020   Pelvic mass in female    Bilateral lower extremity edema 04/13/2020   Shortness of breath    Anxiety disorder 10/29/2018   CKD (chronic kidney disease), stage III ([place])  06/27/2018   COPD (chronic obstructive pulmonary disease) ([place]) 05/25/2018   Obesity 03/20/2018   Dyspnea on exertion 11/06/2017   Abnormal stress test 11/06/2017   Chronic diastolic CHF (congestive heart failure) ([place]) 10/03/2017   Chronic combined systolic and diastolic heart failure ([place]) 09/04/2017   Left ventricular dysfunction 07/31/2017   Cataract, nuclear sclerotic senile, bilateral 02/01/2017   Gouty arthritis of toe of left foot 08/09/2016   Hyperuricemia 08/09/2016   Osteoarthritis (arthritis due to wear and tear of joints) 01/14/2014   Coronary artery disease 08/02/2011   Mitral regurgitation 08/02/2011   OVERACTIVE BLADDER 07/02/2010   ACUTE CYSTITIS 05/25/2010   Pure hypercholesterolemia 08/11/2009   PEDAL EDEMA 07/22/2009   GERD 04/11/2008   Essential hypertension 05/30/2007   MYOCARDIAL INFARCTION, HX OF 05/30/2007   Allergic rhinitis 05/30/2007   LOW BACK PAIN 05/30/2007    REFERRING DIAG: M25.511,G89.29 (ICD-10-CM) - Chronic right shoulder pain   THERAPY DIAG:  Chronic right shoulder pain - Plan: PT plan of care cert/re-cert  Muscle weakness (generalized) - Plan: PT plan of care cert/re-cert  Unsteadiness on feet - Plan: PT plan of care cert/re-cert  Difficulty in walking, not elsewhere classified - Plan: PT plan of care cert/re-cert  Dizziness and giddiness - Plan: PT plan of care cert/re-cert  Repeated falls - Plan: PT plan of care cert/re-cert  Rationale for Evaluation and Treatment Rehabilitation  PERTINENT HISTORY: Dizziness, CVA, Hx of falls, OA  PRECAUTIONS: Fall Risk  SUBJECTIVE: Pt that she is having some tooth pain today.  PAIN:  Are you having pain? Yes: NPRS scale: 4/10 Pain location: right shoulder Pain description: burning Aggravating factors: use Relieving factors: rest  PATIENT GOALS:  Pt would like to be able to move around better without pain.  OBJECTIVE: (objective measures completed at initial evaluation unless otherwise  dated)  DIAGNOSTIC FINDINGS:  01/16/2022 CT of cervical spine:  1. No acute fracture or traumatic subluxation of the cervical spine.  2. Stable 3 mm right upper lobe nodule. No follow-up needed if patient is low-risk.This recommendation follows the consensus statement: Guidelines for Management of Incidental Pulmonary Nodules Detected on CT Images: From the Fleischner Society 2017; Radiology 2017; 284:228-243.   PATIENT SURVEYS:  03/09/2022: FOTO 51% (projected 58% by visit 11) 04/12/2022:  FOTO 59%   COGNITION:           Overall cognitive status: Within functional limits for tasks assessed                                  SENSATION: Light touch: WFL   POSTURE: Forward head, rounded shoulders   UPPER EXTREMITY ROM:  Active ROM Right 03/09/22 Right 04/14/22 Right 04/28/22 Left 03/09/22  Shoulder flexion 115 128 130 137  Shoulder extension        Shoulder abduction 116 125 127 125  Shoulder adduction        Shoulder internal rotation        Shoulder external rotation        Elbow flexion        Elbow extension        Wrist flexion        Wrist extension        Wrist ulnar deviation        Wrist radial deviation        Wrist pronation        Wrist supination        (Blank rows = not tested)   UPPER EXTREMITY MMT:   MMT Right 03/09/2022 Right 04/12/2022 Left 03/09/2022  Shoulder flexion 4 4+ 5  Shoulder extension       Shoulder abduction _0 Shoulder adduction       Shoulder internal rotation 4 4+ 5  Shoulder external rotation _1 Middle trapezius       Lower trapezius       Elbow flexion       Elbow extension       Wrist flexion       Wrist extension       Wrist ulnar deviation       Wrist radial deviation       Wrist pronation       Wrist supination       Grip strength (lbs)       (Blank rows = not tested)   SHOULDER SPECIAL TESTS:            Impingement tests: Hawkins/Kennedy impingement test: positive right            Rotator cuff assessment: Empty  can test: negative   JOINT MOBILITY TESTING:  Crepetis noted in right shoulder   PALPATION:  Tender to palpation along shoulder joint   FUNCTIONAL TESTING: 03/09/2022: 5 times sit to/from stand:  18.7 seconds with UE use TUG 19.3 sec with [facility]               03/29/22: 5x sit to stand: 23 seconds (pt reports dizziness today)    04/05/22: TUG 14.5 seconds   04/12/2022:   5 times sit to/from stand:  14.4 seconds with UE use TUG in 12.97 sec without assistive device  04/28/2022: 5 times sit to/from stand:  14.2 seconds with UE use  TODAY'S TREATMENT:  04/28/2022: Nustep level 5 x6 min with PT present to discuss status Measure A/ROM 5 times sit to/from stand Education about progress and dry needling Trigger Point Dry-Needling  Treatment instructions: Expect mild to moderate muscle soreness. S/S of pneumothorax if dry needled over a lung field, and to seek immediate medical attention should they occur. Patient verbalized understanding of these instructions and education. Patient Consent Given: Yes Education handout provided: Yes Muscles treated: Right upper trap, R rhomboids, R delts Electrical stimulation performed: No Parameters: N/A Treatment response/outcome: Identified trigger points utilizing skilled palpation.  Able to have increased muscle elongation following.  Measured A/ROM again following dry needling and noted following increases:  R shoulder flexion 140 degrees, R shoulder abduction 135 degrees   04/26/2022: Nustep level 5 x6 min with PT present to discuss status Seated with red theraband: shoulder ER, horizontal abduction.  BUE  2x10 Seated with red theraband:  shoulder diagonals BUE 2x10 each direction Standing rows and shoulder extension with green tband 2x10 with cuing for technique and posture Shoulder flexion and chest press with 2# dumbbells 2x10 each bilat Right shoulder IR towel stretch 4x10 sec Standing on foam performing vertical and horizontal head turns 2x1 min  each  04/22/2022: Nustep level 5 x6 min with PT present to discuss status Right shoulder IR towel stretch 4x10 sec Seated with red theraband: shoulder ER, horizontal abduction.  BUE 2x10 Seated with red theraband:  shoulder diagonals BUE 2x10 each direction Standing rows and shoulder extension with green tband 2x10 with cuing for technique and posture Shoulder flexion and chest press with 2# dumbbells 2x10 each bilat    PATIENT EDUCATION: Education details: Issued HEP and provided with yellow tband Person educated: Patient Education method: Explanation, Demonstration, and Handouts Education comprehension: verbalized understanding and returned demonstration     HOME EXERCISE PROGRAM: Access Code: [email or web address] URL: [email or web address] Date: 03/25/2022 Prepared by: Shelby Dubin Armen Waring  Exercises - Shoulder External Rotation and Scapular Retraction with Resistance  - 1-2 x daily - 7 x weekly - 2 sets - 10 reps - Standing Shoulder Horizontal Abduction with Resistance  - 1-2 x daily - 7 x weekly - 2 sets - 10 reps - Seated Scapular Retraction  - 1-2 x daily - 7 x weekly - 2 sets - 10 reps - Seated Upper Trapezius Stretch  - 1 x daily - 7 x weekly - 1 sets - 2 reps - 20 sec hold - Seated Levator Scapulae Stretch  - 1 x daily - 7 x weekly - 1 sets - 2 reps - 20 sec hold   ASSESSMENT:   CLINICAL IMPRESSION: Ms Yellin presents to skilled rehabilitation with reports of still having pain with putting away her dishes and unable to fasten her bra in the back.  Pt is interested in trying dry needling to assess if she has any relief of her pain or increased motion.  Pt has made progress towards increased shoulder A/ROM, and noted a 5-10 degree improvement following dry needling.  Added a goal for pain with functional activities such as putting away dishes.  Pt continues to require skilled PT of twice a week to progress towards goal related activities.     OBJECTIVE IMPAIRMENTS decreased  balance, difficulty walking, decreased ROM, decreased strength, dizziness, increased muscle spasms, impaired UE functional use, postural dysfunction, and pain.    ACTIVITY LIMITATIONS carrying and lifting   PARTICIPATION LIMITATIONS: cleaning and driving   PERSONAL FACTORS Time since onset of injury/illness/exacerbation and 3+ comorbidities: COPD, CHF, HTN, OA  are also affecting patient's functional outcome.    REHAB POTENTIAL: Good   CLINICAL DECISION MAKING: Evolving/moderate complexity   EVALUATION COMPLEXITY: Moderate     GOALS: Goals reviewed with patient? Yes   SHORT TERM GOALS: Target date: 03/30/2022     Pt will be independent with initial HEP. Baseline: Goal status: Goal Met 03/25/2022   2.  Pt will report at least a 40% improvement in symptoms since starting PT. Baseline: 40-45%  (04/05/22) Goal status: MET     LONG TERM GOALS: Target date: 06/24/2022     Pt will be independent with advanced HEP. Baseline:  Goal status: Ongoing   2.  Pt will increase FOTO to 58% to demonstrate her improvements in functional mobility. Baseline: 51% Goal status: Goal Met on 04/12/2022   3.  Pt will report being able to fasten/unfasten her bra without increased  pain. Baseline: burning pain (04/05/22) Goal status: In progress (see above)   4.  Pt will decrease time on TUG to 14 seconds or less without assistive device and good safety to decrease risk of falling. Baseline: 14.5 seconds (04/05/22) Goal status: Goal Met 04/12/2022 (12.97 sec)   5.  Pt will increase right shoulder strength to at least 4+/5 to allow her to carry groceries without difficulty. Baseline:  Goal status: Ongoing (see above)   6.  Pt will state no dizziness with functional tasks, including reaching during grocery shopping for at least the week prior to discharge. Baseline: Pt reports occasionally having dizziness and losing balance (04/28/22) Goal status: Ongoing  7.  Pt will be able to put away dishes in her  home with pain no greater than 2/10.  Baseline:  Pain 4/10  Goal status:  INITIAL/NEW     PLAN: PT FREQUENCY: 2x/week   PT DURATION: 8 weeks   PLANNED INTERVENTIONS: Therapeutic exercises, Therapeutic activity, Neuromuscular re-education, Balance training, Gait training, Patient/Family education, Joint manipulation, Joint mobilization, Stair training, Vestibular training, Aquatic Therapy, Dry Needling, Electrical stimulation, Spinal manipulation, Spinal mobilization, Cryotherapy, Moist heat, Taping, Vasopneumatic device, Traction, Ultrasound, Ionotophoresis 24m/ml Dexamethasone, Manual therapy, and Re-evaluation   PLAN FOR NEXT SESSION: Assess response to dry needling, ionto patch if indicated, Rt shoulder and scapular strength, balance as indicated     SJuel Burrow PT 04/28/22 9:49 AM   [facility]Kind[facility] [address] SY[place] Hill[place]  [id number] # [phone] [phone]

## 2022-04-28 NOTE — Patient Instructions (Signed)

## 2022-04-30 ENCOUNTER — Other Ambulatory Visit: Payer: Self-pay | Admitting: Cardiovascular Disease

## 2022-05-03 ENCOUNTER — Ambulatory Visit: Payer: Medicare Other | Admitting: Rehabilitative and Restorative Service Providers"

## 2022-05-03 ENCOUNTER — Encounter: Payer: Self-pay | Admitting: Rehabilitative and Restorative Service Providers"

## 2022-05-03 DIAGNOSIS — M6281 Muscle weakness (generalized): Secondary | ICD-10-CM

## 2022-05-03 DIAGNOSIS — G8929 Other chronic pain: Secondary | ICD-10-CM

## 2022-05-03 DIAGNOSIS — R296 Repeated falls: Secondary | ICD-10-CM

## 2022-05-03 DIAGNOSIS — R42 Dizziness and giddiness: Secondary | ICD-10-CM | POA: Diagnosis not present

## 2022-05-03 DIAGNOSIS — R262 Difficulty in walking, not elsewhere classified: Secondary | ICD-10-CM

## 2022-05-03 DIAGNOSIS — R2681 Unsteadiness on feet: Secondary | ICD-10-CM | POA: Diagnosis not present

## 2022-05-03 DIAGNOSIS — M25511 Pain in right shoulder: Secondary | ICD-10-CM | POA: Diagnosis not present

## 2022-05-03 NOTE — Progress Notes (Unsigned)
ACUTE VISIT No chief complaint on file.  HPI: [name] is a 76 y.o. female, who is here today complaining of *** HPI  Review of Systems Rest see pertinent positives and negatives per HPI.  Current Outpatient Medications on File Prior to Visit  Medication Sig Dispense Refill  . acetaminophen (TYLENOL) 500 MG tablet Take 500-1,000 mg by mouth every 6 (six) hours as needed for headache.     . albuterol (VENTOLIN HFA) 108 (90 Base) MCG/ACT inhaler Inhale 2 puffs into the lungs every 4 (four) hours as needed for wheezing. (Patient taking differently: Inhale 2 puffs into the lungs as needed for wheezing.) 1 each 3  . allopurinol (ZYLOPRIM) 100 MG tablet TAKE 1 TABLET BY MOUTH EVERY DAY 90 tablet 2  . amLODipine (NORVASC) 5 MG tablet TAKE 1 TABLET (5 MG TOTAL) BY MOUTH DAILY. 90 tablet 3  . APPLE CIDER VINEGAR PO Take 2,400 mg by mouth daily.    . ASPERCREME LIDOCAINE EX Apply 1 application topically daily as needed (pain).    Marland Kitchen aspirin EC 81 MG tablet Take 81 mg by mouth daily.    . cetirizine (ZYRTEC) 10 MG tablet Take 10 mg by mouth daily. (Patient not taking: Reported on 03/09/2022)    . Cholecalciferol (VITAMIN D3) 50 MCG (2000 UT) TABS Take 4,000 Units by mouth daily.     . clopidogrel (PLAVIX) 75 MG tablet TAKE 1 TABLET BY MOUTH DAILY WITH BREAKFAST. 90 tablet 1  . Cyanocobalamin (VITAMIN B-12) 5000 MCG SUBL Place 5,000 mcg under the tongue daily.     . diclofenac Sodium (VOLTAREN) 1 % GEL Apply 4 g topically 4 (four) times daily. 150 g 2  . ezetimibe (ZETIA) 10 MG tablet TAKE ONE TABLET BY MOUTH DAILY 90 tablet 3  . fluticasone (FLONASE) 50 MCG/ACT nasal spray SPRAY 2 SPRAYS INTO EACH NOSTRIL EVERY DAY (Patient taking differently: Place 2 sprays into both nostrils daily.) 48 mL 1  . furosemide (LASIX) 40 MG tablet TAKE 1 TABLET BY MOUTH EVERY DAY 90 tablet 3  . guaiFENesin (MUCINEX) 600 MG 12 hr tablet Take 600 mg by mouth 2 (two) times daily as needed (congestion).     . isosorbide mononitrate (IMDUR) 30 MG 24 hr tablet TAKE 1 TABLET BY MOUTH EVERY DAY 90 tablet 3  . losartan (COZAAR) 100 MG tablet TAKE 1 TABLET BY MOUTH EVERY DAY 90 tablet 3  . metoprolol succinate (TOPROL-XL) 100 MG 24 hr tablet TAKE 1 TABLET BY MOUTH DAILY. TAKE WITH OR IMMEDIATELY FOLLOWING A MEAL. 90 tablet 3  . Misc Natural Products (TART CHERRY ADVANCED) CAPS Take 1,000 capsules by mouth daily.     Marland Kitchen oxymetazoline (AFRIN) 0.05 % nasal spray Place 1 spray into both nostrils 2 (two) times daily as needed for congestion.    . pantoprazole (PROTONIX) 40 MG tablet TAKE 1 TABLET BY MOUTH EVERY DAY 90 tablet 2  . potassium chloride SA (KLOR-CON) 20 MEQ tablet Take 20 mEq by mouth daily.    . pravastatin (PRAVACHOL) 40 MG tablet TAKE 1 TABLET BY MOUTH EVERY DAY IN THE EVENING 90 tablet 3  . Simethicone (GAS-X PO) Take 2 tablets by mouth daily as needed (gas).    . triamcinolone cream (KENALOG) 0.1 % APPLY TO AFFECTED AREA TWICE A DAY 30 g 1  . [DISCONTINUED] nitroGLYCERIN (NITROSTAT) 0.4 MG SL tablet Place 1 tablet (0.4 mg total) under the tongue every 5 (five) minutes as needed for chest pain. 25 tablet 3  Current Facility-Administered Medications on File Prior to Visit  Medication Dose Route Frequency Provider Last Rate Last Admin  . 0.9 %  sodium chloride infusion  500 mL Intravenous Once Irene Shipper, MD         Past Medical History:  Diagnosis Date  . Allergic rhinitis   . Allergy   . Anxiety   . Arthritis   . Breast cancer ([place]) 04/28/2020   rigtht breast  . Bronchitis   . CAD (coronary artery disease)    1/19 PCI/DES to [place] for ISR, normal EF.   . Cancer ([place])    hx of precancerous cells in right breast   . Cataract   . Cervical dysplasia    unsure of procedure, possible "burning" in her late 42s  . CHF (congestive heart failure) ([place])    Echo 06/2019: EF 55-60, elevated LVEDP, normal RV SF, mild MAC, mild MR, trivial TR  . Complication of anesthesia   . COPD (chronic  obstructive pulmonary disease) ([place])    early   . Depression   . Dyspnea    with exertion   . Family history of adverse reaction to anesthesia    daughter- problems wiht n/v  . GERD (gastroesophageal reflux disease)   . Gout   . Headache(784.0)   . Heart murmur    hx of years ago   . Hyperlipidemia   . Hypertension   . Low back pain   . Menopausal syndrome   . Myocardial infarct ([place]) 2007   hx of  . Overactive bladder   . PONV (postoperative nausea and vomiting)   . Sleep apnea    Allergies  Allergen Reactions  . Erythromycin Rash    But isn't certain  . Sulfamethoxazole Rash    Social History   Socioeconomic History  . Marital status: Married    Spouse name: Not on file  . Number of children: 2  . Years of education: Not on file  . Highest education level: GED or equivalent  Occupational History  . Occupation: retired  . Occupation: retired    Comment: Scientist, physiological  Tobacco Use  . Smoking status: Former    Packs/day: 1.25    Years: 52.00    Total pack years: 65.00    Types: Cigarettes    Quit date: 2018    Years since quitting: 5.5  . Smokeless tobacco: Never  . Tobacco comments:    completely quit May of 2018; period of years she did not smoke   Vaping Use  . Vaping Use: Never used  Substance and Sexual Activity  . Alcohol use: Yes    Comment: seldom  . Drug use: No  . Sexual activity: Not Currently  Other Topics Concern  . Not on file  Social History Narrative  . Not on file   Social Determinants of Health   Financial Resource Strain: Low Risk  (10/25/2021)   Overall Financial Resource Strain (CARDIA)   . Difficulty of Paying Living Expenses: Not very hard  Food Insecurity: No Food Insecurity (10/25/2021)   Hunger Vital Sign   . Worried About Charity fundraiser in the Last Year: Never true   . Ran Out of Food in the Last Year: Never true  Transportation Needs: No Transportation Needs (10/25/2021)   PRAPARE - Transportation   . Lack of  Transportation (Medical): No   . Lack of Transportation (Non-Medical): No  Physical Activity: Insufficiently Active (10/25/2021)   Exercise Vital Sign   . Days of  Exercise per Week: 1 day   . Minutes of Exercise per Session: 20 min  Stress: Stress Concern Present (10/25/2021)   [place]   . Feeling of Stress : To some extent  Social Connections: Unknown (10/25/2021)   Social Connection and Isolation Panel [NHANES]   . Frequency of Communication with Friends and Family: Once a week   . Frequency of Social Gatherings with Friends and Family: Patient refused   . Attends Religious Services: Patient refused   . Active Member of Clubs or Organizations: No   . Attends Archivist Meetings: Not on file   . Marital Status: Married    There were no vitals filed for this visit. There is no height or weight on file to calculate BMI.  Physical Exam  ASSESSMENT AND PLAN:  There are no diagnoses linked to this encounter.   No follow-ups on file.   Addaleigh Nicholls G. [name], MD  [facility]. [place] office.  Discharge Instructions   None

## 2022-05-03 NOTE — Therapy (Signed)
OUTPATIENT PHYSICAL THERAPY TREATMENT NOTE   Patient Name: Tracy [name] MRN: [id number] DOB:[birth year], 76 y.o., female Today's Date: 05/03/2022  PCP: [name], [name], MD  REFERRING PROVIDER: [name], [name], MD     END OF SESSION:   PT End of Session - 05/03/22 0853     Visit Number 16    Date for PT Re-Evaluation 06/24/22    Authorization Type Med A- KX modifier needed    Progress Note Due on Visit 20    PT Start Time 0848    PT Stop Time 0926    PT Time Calculation (min) 38 min    Activity Tolerance Patient tolerated treatment well    Behavior During Therapy [facility] for tasks assessed/performed                Past Medical History:  Diagnosis Date   Allergic rhinitis    Allergy    Anxiety    Arthritis    Breast cancer ([place]) 04/28/2020   rigtht breast   Bronchitis    CAD (coronary artery disease)    1/19 PCI/DES to [place] for ISR, normal EF.    Cancer ([place])    hx of precancerous cells in right breast    Cataract    Cervical dysplasia    unsure of procedure, possible "burning" in her late 60s   CHF (congestive heart failure) ([place])    Echo 06/2019: EF 55-60, elevated LVEDP, normal RV SF, mild MAC, mild MR, trivial TR   Complication of anesthesia    COPD (chronic obstructive pulmonary disease) (HCC)    early    Depression    Dyspnea    with exertion    Family history of adverse reaction to anesthesia    daughter- problems wiht n/v   GERD (gastroesophageal reflux disease)    Gout    Headache(784.0)    Heart murmur    hx of years ago    Hyperlipidemia    Hypertension    Low back pain    Menopausal syndrome    Myocardial infarct ([place]) 2007   hx of   Overactive bladder    PONV (postoperative nausea and vomiting)    Sleep apnea    Past Surgical History:  Procedure Laterality Date   ANGIOPLASTY     stent 2007   BREAST LUMPECTOMY WITH RADIOACTIVE SEED LOCALIZATION Right 04/28/2020   Procedure: RIGHT BREAST LUMPECTOMY X 2  WITH RADIOACTIVE SEED  LOCALIZATION;  Surgeon: Donnie Mesa, MD;  Location: [facility];  Service: General;  Laterality: Right;  LMA   CARDIAC CATHETERIZATION     COLONOSCOPY     CORONARY STENT INTERVENTION N/A 11/06/2017   Procedure: CORONARY STENT INTERVENTION;  Surgeon: [name], MD;  Location: [place] [facility];  Service: Cardiovascular;  Laterality: N/A;   LEFT HEART CATH AND CORONARY ANGIOGRAPHY N/A 11/06/2017   Procedure: LEFT HEART CATH AND CORONARY ANGIOGRAPHY;  Surgeon: [name], MD;  Location: [place] [facility];  Service: Cardiovascular;  Laterality: N/A;   LEFT HEART CATH AND CORONARY ANGIOGRAPHY N/A 07/24/2019   Procedure: LEFT HEART CATH AND CORONARY ANGIOGRAPHY;  Surgeon: Burnell Blanks, MD;  Location: [place] [facility];  Service: Cardiovascular;  Laterality: N/A;   RIGHT/LEFT HEART CATH AND CORONARY ANGIOGRAPHY N/A 09/29/2020   Procedure: RIGHT/LEFT HEART CATH AND CORONARY ANGIOGRAPHY;  Surgeon: Belva Crome, MD;  Location: [place] [facility];  Service: Cardiovascular;  Laterality: N/A;   ROBOTIC ASSISTED BILATERAL SALPINGO OOPHERECTOMY Bilateral 11/10/2020  Procedure: XI ROBOTIC ASSISTED BILATERAL SALPINGO OOPHORECTOMY WITH MINI LAPAROTOMY FOR DRAINAGE;  Surgeon: Lafonda Mosses, MD;  Location: [facility];  Service: Gynecology;  Laterality: Bilateral;  MINI LAP FIRST   TONSILLECTOMY     VULVECTOMY N/A 11/10/2020   Procedure: WIDE EXCISION VULVECTOMY;  Surgeon: Lafonda Mosses, MD;  Location: [facility];  Service: Gynecology;  Laterality: N/A;   Patient Active Problem List   Diagnosis Date Noted   Dizziness 01/19/2022   Frequent falls 01/19/2022   Tremor of both hands 08/16/2021   Aortic atherosclerosis ([place]) 06/15/2021   Generalized osteoarthritis of multiple sites 12/25/2020   Chronic pain disorder 12/25/2020   Pelvic mass in female    Bilateral lower extremity edema 04/13/2020   Shortness of breath    Anxiety disorder 10/29/2018   CKD (chronic kidney  disease), stage III ([place]) 06/27/2018   COPD (chronic obstructive pulmonary disease) ([place]) 05/25/2018   Obesity 03/20/2018   Dyspnea on exertion 11/06/2017   Abnormal stress test 11/06/2017   Chronic diastolic CHF (congestive heart failure) ([place]) 10/03/2017   Chronic combined systolic and diastolic heart failure ([place]) 09/04/2017   Left ventricular dysfunction 07/31/2017   Cataract, nuclear sclerotic senile, bilateral 02/01/2017   Gouty arthritis of toe of left foot 08/09/2016   Hyperuricemia 08/09/2016   Osteoarthritis (arthritis due to wear and tear of joints) 01/14/2014   Coronary artery disease 08/02/2011   Mitral regurgitation 08/02/2011   OVERACTIVE BLADDER 07/02/2010   ACUTE CYSTITIS 05/25/2010   Pure hypercholesterolemia 08/11/2009   PEDAL EDEMA 07/22/2009   GERD 04/11/2008   Essential hypertension 05/30/2007   MYOCARDIAL INFARCTION, HX OF 05/30/2007   Allergic rhinitis 05/30/2007   LOW BACK PAIN 05/30/2007    REFERRING DIAG: M25.511,G89.29 (ICD-10-CM) - Chronic right shoulder pain   THERAPY DIAG:  Chronic right shoulder pain  Muscle weakness (generalized)  Unsteadiness on feet  Difficulty in walking, not elsewhere classified  Dizziness and giddiness  Repeated falls  Rationale for Evaluation and Treatment Rehabilitation  PERTINENT HISTORY: Dizziness, CVA, Hx of falls, OA  PRECAUTIONS: Fall Risk  SUBJECTIVE: Pt that she is having more shoulder pain today, that she could not even put on her bra this morning.  Pt also reports that she felt a knot under her arm and is going to contact Dr [name] for follow up.  PAIN:  Are you having pain? Yes: NPRS scale: 6-7/10 Pain location: right shoulder Pain description: burning Aggravating factors: use Relieving factors: rest  PATIENT GOALS:  Pt would like to be able to move around better without pain.  OBJECTIVE: (objective measures completed at initial evaluation unless otherwise dated)  DIAGNOSTIC FINDINGS:   01/16/2022 CT of cervical spine:  1. No acute fracture or traumatic subluxation of the cervical spine.  2. Stable 3 mm right upper lobe nodule. No follow-up needed if patient is low-risk.This recommendation follows the consensus statement: Guidelines for Management of Incidental Pulmonary Nodules Detected on CT Images: From the Fleischner Society 2017; Radiology 2017; 284:228-243.   PATIENT SURVEYS:  03/09/2022: FOTO 51% (projected 58% by visit 11) 04/12/2022:  FOTO 59%   COGNITION:           Overall cognitive status: Within functional limits for tasks assessed                                  SENSATION: Light touch: WFL   POSTURE: Forward head, rounded shoulders   UPPER EXTREMITY ROM:  Active ROM Right 03/09/22 Right 04/14/22 Right 04/28/22 Left 03/09/22  Shoulder flexion 115 128 130 137  Shoulder extension        Shoulder abduction 116 125 127 125  Shoulder adduction        Shoulder internal rotation        Shoulder external rotation        Elbow flexion        Elbow extension        Wrist flexion        Wrist extension        Wrist ulnar deviation        Wrist radial deviation        Wrist pronation        Wrist supination        (Blank rows = not tested)   UPPER EXTREMITY MMT:   MMT Right 03/09/2022 Right 04/12/2022 Left 03/09/2022  Shoulder flexion 4 4+ 5  Shoulder extension       Shoulder abduction _0 Shoulder adduction       Shoulder internal rotation 4 4+ 5  Shoulder external rotation _1 Middle trapezius       Lower trapezius       Elbow flexion       Elbow extension       Wrist flexion       Wrist extension       Wrist ulnar deviation       Wrist radial deviation       Wrist pronation       Wrist supination       Grip strength (lbs)       (Blank rows = not tested)   SHOULDER SPECIAL TESTS:            Impingement tests: Hawkins/Kennedy impingement test: positive right            Rotator cuff assessment: Empty can test: negative   JOINT  MOBILITY TESTING:  Crepetis noted in right shoulder   PALPATION:  Tender to palpation along shoulder joint   FUNCTIONAL TESTING: 03/09/2022: 5 times sit to/from stand:  18.7 seconds with UE use TUG 19.3 sec with [facility]               03/29/22: 5x sit to stand: 23 seconds (pt reports dizziness today)    04/05/22: TUG 14.5 seconds   04/12/2022:   5 times sit to/from stand:  14.4 seconds with UE use TUG in 12.97 sec without assistive device  04/28/2022: 5 times sit to/from stand:  14.2 seconds with UE use  TODAY'S TREATMENT:  05/03/2022: Nustep level 5 x6 min with PT present to discuss status Seated with red theraband: shoulder ER, horizontal abduction.  BUE 2x10 Seated with red theraband:  shoulder diagonals BUE 2x10 each direction Shoulder flexion and chest press with 2# dumbbells 2x10 each bilat Standing rows and shoulder extension with green tband 2x10 with cuing for technique and posture Right shoulder IR towel stretch 3x20 sec Wall wash for flexion and abduction 2x10 RUE Standing on foam performing vertical and horizontal head turns 2x1 min each  04/28/2022: Nustep level 5 x6 min with PT present to discuss status Measure A/ROM 5 times sit to/from stand Education about progress and dry needling Trigger Point Dry-Needling  Treatment instructions: Expect mild to moderate muscle soreness. S/S of pneumothorax if dry needled over a lung field, and to seek immediate medical attention should they occur. Patient verbalized understanding of these  instructions and education. Patient Consent Given: Yes Education handout provided: Yes Muscles treated: Right upper trap, R rhomboids, R delts Electrical stimulation performed: No Parameters: N/A Treatment response/outcome: Identified trigger points utilizing skilled palpation.  Able to have increased muscle elongation following.  Measured A/ROM again following dry needling and noted following increases:  R shoulder flexion 140 degrees, R shoulder  abduction 135 degrees   04/26/2022: Nustep level 5 x6 min with PT present to discuss status Seated with red theraband: shoulder ER, horizontal abduction.  BUE 2x10 Seated with red theraband:  shoulder diagonals BUE 2x10 each direction Standing rows and shoulder extension with green tband 2x10 with cuing for technique and posture Shoulder flexion and chest press with 2# dumbbells 2x10 each bilat Right shoulder IR towel stretch 4x10 sec Standing on foam performing vertical and horizontal head turns 2x1 min each    PATIENT EDUCATION: Education details: Issued HEP and provided with yellow tband Person educated: Patient Education method: Explanation, Demonstration, and Handouts Education comprehension: verbalized understanding and returned demonstration     HOME EXERCISE PROGRAM: Access Code: [email or web address] URL: [email or web address] Date: 03/25/2022 Prepared by: Shelby Dubin Tylin Force  Exercises - Shoulder External Rotation and Scapular Retraction with Resistance  - 1-2 x daily - 7 x weekly - 2 sets - 10 reps - Standing Shoulder Horizontal Abduction with Resistance  - 1-2 x daily - 7 x weekly - 2 sets - 10 reps - Seated Scapular Retraction  - 1-2 x daily - 7 x weekly - 2 sets - 10 reps - Seated Upper Trapezius Stretch  - 1 x daily - 7 x weekly - 1 sets - 2 reps - 20 sec hold - Seated Levator Scapulae Stretch  - 1 x daily - 7 x weekly - 1 sets - 2 reps - 20 sec hold   ASSESSMENT:   CLINICAL IMPRESSION: Ms Flansburg presents to skilled rehabilitation with reports of still having increased pain today and did note a new knot under her arm.  Pt states that she is going to contact her PCP and this PT is in agreement to notify Dr [name] of knot and increased right shoulder pain.  Pt verbalizes her understanding.  Pt continues with pain, but does perform exercises.  Pt states that she had some soreness following dry needling and she maybe felt some relief of pain/shoulder looseness, but it was  short lived.  Pt continues to require skilled PT to progress towards goal related activities.     OBJECTIVE IMPAIRMENTS decreased balance, difficulty walking, decreased ROM, decreased strength, dizziness, increased muscle spasms, impaired UE functional use, postural dysfunction, and pain.    ACTIVITY LIMITATIONS carrying and lifting   PARTICIPATION LIMITATIONS: cleaning and driving   PERSONAL FACTORS Time since onset of injury/illness/exacerbation and 3+ comorbidities: COPD, CHF, HTN, OA  are also affecting patient's functional outcome.    REHAB POTENTIAL: Good   CLINICAL DECISION MAKING: Evolving/moderate complexity   EVALUATION COMPLEXITY: Moderate     GOALS: Goals reviewed with patient? Yes   SHORT TERM GOALS: Target date: 03/30/2022     Pt will be independent with initial HEP. Baseline: Goal status: Goal Met 03/25/2022   2.  Pt will report at least a 40% improvement in symptoms since starting PT. Baseline: 40-45%  (04/05/22) Goal status: MET     LONG TERM GOALS: Target date: 06/24/2022     Pt will be independent with advanced HEP. Baseline:  Goal status: Ongoing   2.  Pt will increase FOTO to 58% to demonstrate her  improvements in functional mobility. Baseline: 51% Goal status: Goal Met on 04/12/2022   3.  Pt will report being able to fasten/unfasten her bra without increased pain. Baseline: burning pain (04/05/22) Goal status: In progress (see above)   4.  Pt will decrease time on TUG to 14 seconds or less without assistive device and good safety to decrease risk of falling. Baseline: 14.5 seconds (04/05/22) Goal status: Goal Met 04/12/2022 (12.97 sec)   5.  Pt will increase right shoulder strength to at least 4+/5 to allow her to carry groceries without difficulty. Baseline:  Goal status: Ongoing (see above)   6.  Pt will state no dizziness with functional tasks, including reaching during grocery shopping for at least the week prior to discharge. Baseline: Pt  reports occasionally having dizziness and losing balance (04/28/22) Goal status: Ongoing  7.  Pt will be able to put away dishes in her home with pain no greater than 2/10.  Baseline:  Pain 4/10  Goal status:  INITIAL/NEW     PLAN: PT FREQUENCY: 2x/week   PT DURATION: 8 weeks   PLANNED INTERVENTIONS: Therapeutic exercises, Therapeutic activity, Neuromuscular re-education, Balance training, Gait training, Patient/Family education, Joint manipulation, Joint mobilization, Stair training, Vestibular training, Aquatic Therapy, Dry Needling, Electrical stimulation, Spinal manipulation, Spinal mobilization, Cryotherapy, Moist heat, Taping, Vasopneumatic device, Traction, Ultrasound, Ionotophoresis 58m/ml Dexamethasone, Manual therapy, and Re-evaluation   PLAN FOR NEXT SESSION: Manual/dry needling as indicated, ionto patch if indicated pending results of knot pt felt, Rt shoulder and scapular strength, balance as indicated     SJuel Burrow PT 05/03/22 9:34 AM   [place]St[facility] Charles[place] [employer] [id number] # [phone] [phone]

## 2022-05-04 ENCOUNTER — Encounter: Payer: Self-pay | Admitting: Family Medicine

## 2022-05-04 ENCOUNTER — Ambulatory Visit (INDEPENDENT_AMBULATORY_CARE_PROVIDER_SITE_OTHER): Payer: Medicare Other | Admitting: Family Medicine

## 2022-05-04 VITALS — BP 120/80 | HR 87 | Resp 16 | Ht 65.0 in | Wt 203.0 lb

## 2022-05-04 DIAGNOSIS — R2231 Localized swelling, mass and lump, right upper limb: Secondary | ICD-10-CM | POA: Diagnosis not present

## 2022-05-04 DIAGNOSIS — I700 Atherosclerosis of aorta: Secondary | ICD-10-CM | POA: Diagnosis not present

## 2022-05-04 DIAGNOSIS — I25118 Atherosclerotic heart disease of native coronary artery with other forms of angina pectoris: Secondary | ICD-10-CM | POA: Diagnosis not present

## 2022-05-04 DIAGNOSIS — G8929 Other chronic pain: Secondary | ICD-10-CM | POA: Diagnosis not present

## 2022-05-04 DIAGNOSIS — R251 Tremor, unspecified: Secondary | ICD-10-CM | POA: Diagnosis not present

## 2022-05-04 DIAGNOSIS — M25511 Pain in right shoulder: Secondary | ICD-10-CM

## 2022-05-04 NOTE — Patient Instructions (Signed)
A few things to remember from today's visit:  Axillary mass, right  Chronic right shoulder pain  Tremor of both hands  If you need refills please call your pharmacy. Do not use My Chart to request refills or for acute issues that need immediate attention.   Mass in armpit seems fatty tissue, ? Lipoma. As we decided today we are going to continue monitoring. Follow with ortho for shoulder pain and continue PT.  Please be sure medication list is accurate. If a new problem present, please set up appointment sooner than planned today.  Essential Tremor A tremor is trembling or shaking that a person cannot control. Most tremors affect the hands or arms. Tremors can also affect the head, vocal cords, legs, and other parts of the body. Essential tremor is a tremor without a known cause. Usually, it occurs while a person is trying to perform an action. It tends to get worse gradually as a person ages. What are the causes? The cause of this condition is not known, but it often runs in families. What increases the risk? You are more likely to develop this condition if: You have a family member with essential tremor. You are 75 years of age or older. What are the signs or symptoms? The main sign of a tremor is a rhythmic shaking of certain parts of your body that is uncontrolled and unintentional. You may: Have difficulty eating with a spoon or fork. Have difficulty writing. Nod your head up and down or side to side. Have a quivering voice. The shaking may: Get worse over time. Come and go. Be more noticeable on one side of your body. Get worse due to stress, tiredness (fatigue), caffeine, and extreme heat or cold. How is this diagnosed? This condition may be diagnosed based on: Your symptoms and medical history. A physical exam. There is no single test to diagnose an essential tremor. However, your health care provider may order tests to rule out other causes of your condition. These  may include: Blood and urine tests. Imaging studies of your brain, such as a CT scan or MRI. How is this treated? Treatment for essential tremor depends on the severity of the condition. Mild tremors may not need treatment if they do not affect your day-to-day life. Severe tremors may need to be treated using one or more of the following options: Medicines. Injections of a substance called botulinum toxin. Procedures such as deep brain stimulation (DBS) implantation or MRI-guided ultrasound treatment. Lifestyle changes. Occupational or physical therapy. Follow these instructions at home: Lifestyle  Do not use any products that contain nicotine or tobacco. These products include cigarettes, chewing tobacco, and vaping devices, such as e-cigarettes. If you need help quitting, ask your health care provider. Limit your caffeine intake as told by your health care provider. Try to get 8 hours of sleep each night. Find ways to manage your stress that fit your lifestyle and personality. Consider trying meditation or yoga. Try to anticipate stressful situations and allow extra time to manage them. If you are struggling emotionally with the effects of your tremor, consider working with a mental health provider. General instructions Take over-the-counter and prescription medicines only as told by your health care provider. Avoid extreme heat and extreme cold. Keep all follow-up visits. This is important. Visits may include physical therapy visits. Where to find more information [employer] of Neurological Disorders and Stroke: [employer].it Contact a health care provider if: You experience any changes in the location or intensity  of your tremors. You start having a tremor after starting a new medicine. You have a tremor with other symptoms, such as: Numbness. Tingling. Pain. Weakness. Your tremor gets worse. Your tremor interferes with your daily life. You feel down, blue, or sad  for at least 2 weeks in a row. Worrying about your tremor and what other people think about you interferes with your everyday life functions, including relationships, work, or school. Summary Essential tremor is a tremor without a known cause. Usually, it occurs when you are trying to perform an action. You are more likely to develop this condition if you have a family member with essential tremor. The main sign of a tremor is a rhythmic shaking of certain parts of your body that is uncontrolled and unintentional. Treatment for essential tremor depends on the severity of the condition. This information is not intended to replace advice given to you by your health care provider. Make sure you discuss any questions you have with your health care provider. Document Revised: 07/23/2021 Document Reviewed: 07/23/2021 Elsevier Patient Education  [place].

## 2022-05-05 ENCOUNTER — Encounter: Payer: Self-pay | Admitting: Rehabilitative and Restorative Service Providers"

## 2022-05-05 ENCOUNTER — Ambulatory Visit: Payer: Medicare Other | Admitting: Rehabilitative and Restorative Service Providers"

## 2022-05-05 DIAGNOSIS — M6281 Muscle weakness (generalized): Secondary | ICD-10-CM

## 2022-05-05 DIAGNOSIS — M25511 Pain in right shoulder: Secondary | ICD-10-CM | POA: Diagnosis not present

## 2022-05-05 DIAGNOSIS — G8929 Other chronic pain: Secondary | ICD-10-CM | POA: Diagnosis not present

## 2022-05-05 DIAGNOSIS — R262 Difficulty in walking, not elsewhere classified: Secondary | ICD-10-CM

## 2022-05-05 DIAGNOSIS — R2681 Unsteadiness on feet: Secondary | ICD-10-CM | POA: Diagnosis not present

## 2022-05-05 DIAGNOSIS — R42 Dizziness and giddiness: Secondary | ICD-10-CM

## 2022-05-05 DIAGNOSIS — R296 Repeated falls: Secondary | ICD-10-CM

## 2022-05-05 NOTE — Therapy (Signed)
OUTPATIENT PHYSICAL THERAPY TREATMENT NOTE   Patient Name: Tracy [name] MRN: [id number] DOB:05-02-1946, 76 y.o., female Today's Date: 05/05/2022  PCP: [name], [name], MD  REFERRING PROVIDER: [name], [name], MD     END OF SESSION:   PT End of Session - 05/05/22 0935     Visit Number 17    Date for PT Re-Evaluation 06/24/22    Authorization Type Med A- KX modifier needed    Progress Note Due on Visit 20    PT Start Time 0930    PT Stop Time 1010    PT Time Calculation (min) 40 min    Activity Tolerance Patient tolerated treatment well    Behavior During Therapy [facility] for tasks assessed/performed                Past Medical History:  Diagnosis Date   Allergic rhinitis    Allergy    Anxiety    Arthritis    Breast cancer ([place]) 04/28/2020   rigtht breast   Bronchitis    CAD (coronary artery disease)    1/19 PCI/DES to [facility] for ISR, normal EF.    Cancer ([place])    hx of precancerous cells in right breast    Cataract    Cervical dysplasia    unsure of procedure, possible "burning" in her late 79s   CHF (congestive heart failure) ([place])    Echo 06/2019: EF 55-60, elevated LVEDP, normal RV SF, mild MAC, mild MR, trivial TR   Complication of anesthesia    COPD (chronic obstructive pulmonary disease) (HCC)    early    Depression    Dyspnea    with exertion    Family history of adverse reaction to anesthesia    daughter- problems wiht n/v   GERD (gastroesophageal reflux disease)    Gout    Headache(784.0)    Heart murmur    hx of years ago    Hyperlipidemia    Hypertension    Low back pain    Menopausal syndrome    Myocardial infarct ([place]) 2007   hx of   Overactive bladder    PONV (postoperative nausea and vomiting)    Sleep apnea    Past Surgical History:  Procedure Laterality Date   ANGIOPLASTY     stent 2007   BREAST LUMPECTOMY WITH RADIOACTIVE SEED LOCALIZATION Right 04/28/2020   Procedure: RIGHT BREAST LUMPECTOMY X 2  WITH RADIOACTIVE SEED  LOCALIZATION;  Surgeon: Donnie Mesa, MD;  Location: [facility];  Service: General;  Laterality: Right;  LMA   CARDIAC CATHETERIZATION     COLONOSCOPY     CORONARY STENT INTERVENTION N/A 11/06/2017   Procedure: CORONARY STENT INTERVENTION;  Surgeon: [name], MD;  Location: [facility];  Service: Cardiovascular;  Laterality: N/A;   LEFT HEART CATH AND CORONARY ANGIOGRAPHY N/A 11/06/2017   Procedure: LEFT HEART CATH AND CORONARY ANGIOGRAPHY;  Surgeon: [name], MD;  Location: [facility];  Service: Cardiovascular;  Laterality: N/A;   LEFT HEART CATH AND CORONARY ANGIOGRAPHY N/A 07/24/2019   Procedure: LEFT HEART CATH AND CORONARY ANGIOGRAPHY;  Surgeon: Burnell Blanks, MD;  Location: [facility];  Service: Cardiovascular;  Laterality: N/A;   RIGHT/LEFT HEART CATH AND CORONARY ANGIOGRAPHY N/A 09/29/2020   Procedure: RIGHT/LEFT HEART CATH AND CORONARY ANGIOGRAPHY;  Surgeon: Belva Crome, MD;  Location: [facility];  Service: Cardiovascular;  Laterality: N/A;   ROBOTIC ASSISTED BILATERAL SALPINGO OOPHERECTOMY Bilateral 11/10/2020  Procedure: XI ROBOTIC ASSISTED BILATERAL SALPINGO OOPHORECTOMY WITH MINI LAPAROTOMY FOR DRAINAGE;  Surgeon: Lafonda Mosses, MD;  Location: [facility];  Service: Gynecology;  Laterality: Bilateral;  MINI LAP FIRST   TONSILLECTOMY     VULVECTOMY N/A 11/10/2020   Procedure: WIDE EXCISION VULVECTOMY;  Surgeon: Lafonda Mosses, MD;  Location: [facility];  Service: Gynecology;  Laterality: N/A;   Patient Active Problem List   Diagnosis Date Noted   Dizziness 01/19/2022   Frequent falls 01/19/2022   Tremor of both hands 08/16/2021   Aortic atherosclerosis ([place]) 06/15/2021   Generalized osteoarthritis of multiple sites 12/25/2020   Chronic pain disorder 12/25/2020   Pelvic mass in female    Bilateral lower extremity edema 04/13/2020   Shortness of breath    Anxiety disorder 10/29/2018   CKD (chronic kidney  disease), stage III ([place]) 06/27/2018   COPD (chronic obstructive pulmonary disease) ([place]) 05/25/2018   Obesity 03/20/2018   Dyspnea on exertion 11/06/2017   Abnormal stress test 11/06/2017   Chronic diastolic CHF (congestive heart failure) ([place]) 10/03/2017   Chronic combined systolic and diastolic heart failure ([place]) 09/04/2017   Left ventricular dysfunction 07/31/2017   Cataract, nuclear sclerotic senile, bilateral 02/01/2017   Gouty arthritis of toe of left foot 08/09/2016   Hyperuricemia 08/09/2016   Osteoarthritis (arthritis due to wear and tear of joints) 01/14/2014   Coronary artery disease 08/02/2011   Mitral regurgitation 08/02/2011   OVERACTIVE BLADDER 07/02/2010   ACUTE CYSTITIS 05/25/2010   Pure hypercholesterolemia 08/11/2009   PEDAL EDEMA 07/22/2009   GERD 04/11/2008   Essential hypertension 05/30/2007   MYOCARDIAL INFARCTION, HX OF 05/30/2007   Allergic rhinitis 05/30/2007   LOW BACK PAIN 05/30/2007    REFERRING DIAG: M25.511,G89.29 (ICD-10-CM) - Chronic right shoulder pain   THERAPY DIAG:  Chronic right shoulder pain  Muscle weakness (generalized)  Unsteadiness on feet  Difficulty in walking, not elsewhere classified  Dizziness and giddiness  Repeated falls  Rationale for Evaluation and Treatment Rehabilitation  PERTINENT HISTORY: Dizziness, CVA, Hx of falls, OA  PRECAUTIONS: Fall Risk  SUBJECTIVE: Pt reports that Dr [name] did not believe the mass under her right axilla was suspicious and they would monitor it. Pt reports that she did have a dizzy episode earlier today, but is feeling better now.  PAIN:  Are you having pain? Yes: NPRS scale: 6/10 Pain location: right shoulder Pain description: burning Aggravating factors: use Relieving factors: rest  PATIENT GOALS:  Pt would like to be able to move around better without pain.  OBJECTIVE: (objective measures completed at initial evaluation unless otherwise dated)  DIAGNOSTIC FINDINGS:   01/16/2022 CT of cervical spine:  1. No acute fracture or traumatic subluxation of the cervical spine.  2. Stable 3 mm right upper lobe nodule. No follow-up needed if patient is low-risk.This recommendation follows the consensus statement: Guidelines for Management of Incidental Pulmonary Nodules Detected on CT Images: From the Fleischner Society 2017; Radiology 2017; 284:228-243.   PATIENT SURVEYS:  03/09/2022: FOTO 51% (projected 58% by visit 11) 04/12/2022:  FOTO 59%   COGNITION:           Overall cognitive status: Within functional limits for tasks assessed                                  SENSATION: Light touch: WFL   POSTURE: Forward head, rounded shoulders   UPPER EXTREMITY ROM:    Active ROM Right  03/09/22 Right 04/14/22 Right 04/28/22 Left 03/09/22  Shoulder flexion 115 128 130 137  Shoulder extension        Shoulder abduction 116 125 127 125  Shoulder adduction        Shoulder internal rotation        Shoulder external rotation        Elbow flexion        Elbow extension        Wrist flexion        Wrist extension        Wrist ulnar deviation        Wrist radial deviation        Wrist pronation        Wrist supination        (Blank rows = not tested)   UPPER EXTREMITY MMT:   MMT Right 03/09/2022 Right 04/12/2022 Left 03/09/2022  Shoulder flexion 4 4+ 5  Shoulder extension       Shoulder abduction _0 Shoulder adduction       Shoulder internal rotation 4 4+ 5  Shoulder external rotation _1 Middle trapezius       Lower trapezius       Elbow flexion       Elbow extension       Wrist flexion       Wrist extension       Wrist ulnar deviation       Wrist radial deviation       Wrist pronation       Wrist supination       Grip strength (lbs)       (Blank rows = not tested)   SHOULDER SPECIAL TESTS:            Impingement tests: Hawkins/Kennedy impingement test: positive right            Rotator cuff assessment: Empty can test: negative   JOINT  MOBILITY TESTING:  Crepetis noted in right shoulder   PALPATION:  Tender to palpation along shoulder joint   FUNCTIONAL TESTING: 03/09/2022: 5 times sit to/from stand:  18.7 seconds with UE use TUG 19.3 sec with [facility]               03/29/22: 5x sit to stand: 23 seconds (pt reports dizziness today)    04/05/22: TUG 14.5 seconds   04/12/2022:   5 times sit to/from stand:  14.4 seconds with UE use TUG in 12.97 sec without assistive device  04/28/2022: 5 times sit to/from stand:  14.2 seconds with UE use  TODAY'S TREATMENT:  05/05/2022: Nustep level 5 x6 min with PT present to discuss status Seated with red theraband: shoulder ER, horizontal abduction.  BUE 2x10 Shoulder flexion and chest press with 2# dumbbells 2x10 each bilat Right shoulder IR towel stretch 3x20 sec Wall wash for flexion and abduction 2x10 RUE  05/03/2022: Nustep level 5 x6 min with PT present to discuss status Seated with red theraband: shoulder ER, horizontal abduction.  BUE 2x10 Seated with red theraband:  shoulder diagonals BUE 2x10 each direction Shoulder flexion and chest press with 2# dumbbells 2x10 each bilat Standing rows and shoulder extension with green tband 2x10 with cuing for technique and posture Right shoulder IR towel stretch 3x20 sec Wall wash for flexion and abduction 2x10 RUE Standing on foam performing vertical and horizontal head turns 2x1 min each  04/28/2022: Nustep level 5 x6 min with PT present to discuss status Measure A/ROM  5 times sit to/from stand Education about progress and dry needling Trigger Point Dry-Needling  Treatment instructions: Expect mild to moderate muscle soreness. S/S of pneumothorax if dry needled over a lung field, and to seek immediate medical attention should they occur. Patient verbalized understanding of these instructions and education. Patient Consent Given: Yes Education handout provided: Yes Muscles treated: Right upper trap, R rhomboids, R delts Electrical  stimulation performed: No Parameters: N/A Treatment response/outcome: Identified trigger points utilizing skilled palpation.  Able to have increased muscle elongation following.  Measured A/ROM again following dry needling and noted following increases:  R shoulder flexion 140 degrees, R shoulder abduction 135 degrees   04/26/2022: Nustep level 5 x6 min with PT present to discuss status Seated with red theraband: shoulder ER, horizontal abduction.  BUE 2x10 Seated with red theraband:  shoulder diagonals BUE 2x10 each direction Standing rows and shoulder extension with green tband 2x10 with cuing for technique and posture Shoulder flexion and chest press with 2# dumbbells 2x10 each bilat Right shoulder IR towel stretch 4x10 sec Standing on foam performing vertical and horizontal head turns 2x1 min each    PATIENT EDUCATION: Education details: Issued HEP and provided with yellow tband Person educated: Patient Education method: Explanation, Demonstration, and Handouts Education comprehension: verbalized understanding and returned demonstration     HOME EXERCISE PROGRAM: Access Code: [email or web address] URL: [email or web address] Date: 03/25/2022 Prepared by: Shelby Dubin Reha Martinovich  Exercises - Shoulder External Rotation and Scapular Retraction with Resistance  - 1-2 x daily - 7 x weekly - 2 sets - 10 reps - Standing Shoulder Horizontal Abduction with Resistance  - 1-2 x daily - 7 x weekly - 2 sets - 10 reps - Seated Scapular Retraction  - 1-2 x daily - 7 x weekly - 2 sets - 10 reps - Seated Upper Trapezius Stretch  - 1 x daily - 7 x weekly - 1 sets - 2 reps - 20 sec hold - Seated Levator Scapulae Stretch  - 1 x daily - 7 x weekly - 1 sets - 2 reps - 20 sec hold   ASSESSMENT:   CLINICAL IMPRESSION: Ms Kirchner presents to skilled rehabilitation with reports of still having pain.  She reports overall feeling 40% better since starting skilled PT; same as last time asked. Pt continues to have  same level of pain and recommended she contact ortho, as she continues to have increased pain, despite therapy.  Pt continues to require skilled PT to progress towards goal related activities and decreased pain.     OBJECTIVE IMPAIRMENTS decreased balance, difficulty walking, decreased ROM, decreased strength, dizziness, increased muscle spasms, impaired UE functional use, postural dysfunction, and pain.    ACTIVITY LIMITATIONS carrying and lifting   PARTICIPATION LIMITATIONS: cleaning and driving   PERSONAL FACTORS Time since onset of injury/illness/exacerbation and 3+ comorbidities: COPD, CHF, HTN, OA  are also affecting patient's functional outcome.    REHAB POTENTIAL: Good   CLINICAL DECISION MAKING: Evolving/moderate complexity   EVALUATION COMPLEXITY: Moderate     GOALS: Goals reviewed with patient? Yes   SHORT TERM GOALS: Target date: 03/30/2022     Pt will be independent with initial HEP. Baseline: Goal status: Goal Met 03/25/2022   2.  Pt will report at least a 40% improvement in symptoms since starting PT. Baseline: 40-45%  (04/05/22) Goal status: MET     LONG TERM GOALS: Target date: 06/24/2022     Pt will be independent with advanced HEP. Baseline:  Goal status: Ongoing   2.  Pt will increase FOTO to 58% to demonstrate her improvements in functional mobility. Baseline: 51% Goal status: Goal Met on 04/12/2022   3.  Pt will report being able to fasten/unfasten her bra without increased pain. Baseline: burning pain (04/05/22) Goal status: In progress (see above)   4.  Pt will decrease time on TUG to 14 seconds or less without assistive device and good safety to decrease risk of falling. Baseline: 14.5 seconds (04/05/22) Goal status: Goal Met 04/12/2022 (12.97 sec)   5.  Pt will increase right shoulder strength to at least 4+/5 to allow her to carry groceries without difficulty. Baseline:  Goal status: Ongoing (see above)   6.  Pt will state no dizziness with  functional tasks, including reaching during grocery shopping for at least the week prior to discharge. Baseline: Pt reports occasionally having dizziness and losing balance (04/28/22) Goal status: Ongoing  7.  Pt will be able to put away dishes in her home with pain no greater than 2/10.  Baseline:  Pain 4/10  Goal status:  INITIAL/NEW     PLAN: PT FREQUENCY: 2x/week   PT DURATION: 8 weeks   PLANNED INTERVENTIONS: Therapeutic exercises, Therapeutic activity, Neuromuscular re-education, Balance training, Gait training, Patient/Family education, Joint manipulation, Joint mobilization, Stair training, Vestibular training, Aquatic Therapy, Dry Needling, Electrical stimulation, Spinal manipulation, Spinal mobilization, Cryotherapy, Moist heat, Taping, Vasopneumatic device, Traction, Ultrasound, Ionotophoresis 31m/ml Dexamethasone, Manual therapy, and Re-evaluation   PLAN FOR NEXT SESSION: Manual/dry needling as indicated, ionto patch if indicated pending results of knot pt felt, Rt shoulder and scapular strength, balance as indicated     SJuel Burrow PT 05/05/22 10:48 AM   [place] [place] [id number] # [phone] [phone]

## 2022-05-11 ENCOUNTER — Ambulatory Visit: Payer: Medicare Other | Admitting: Rehabilitative and Restorative Service Providers"

## 2022-05-11 ENCOUNTER — Encounter: Payer: Self-pay | Admitting: Rehabilitative and Restorative Service Providers"

## 2022-05-11 DIAGNOSIS — R2681 Unsteadiness on feet: Secondary | ICD-10-CM

## 2022-05-11 DIAGNOSIS — M25511 Pain in right shoulder: Secondary | ICD-10-CM | POA: Diagnosis not present

## 2022-05-11 DIAGNOSIS — G8929 Other chronic pain: Secondary | ICD-10-CM | POA: Diagnosis not present

## 2022-05-11 DIAGNOSIS — M6281 Muscle weakness (generalized): Secondary | ICD-10-CM

## 2022-05-11 DIAGNOSIS — R262 Difficulty in walking, not elsewhere classified: Secondary | ICD-10-CM

## 2022-05-11 DIAGNOSIS — R42 Dizziness and giddiness: Secondary | ICD-10-CM | POA: Diagnosis not present

## 2022-05-11 DIAGNOSIS — R296 Repeated falls: Secondary | ICD-10-CM

## 2022-05-11 NOTE — Therapy (Signed)
OUTPATIENT PHYSICAL THERAPY TREATMENT NOTE   Patient Name: Tracy [name] MRN: [id number] DOB:[birth year], 76 y.o., female Today's Date: 05/11/2022  PCP: [name], [name], MD  REFERRING PROVIDER: [name], [name], MD     END OF SESSION:   PT End of Session - 05/11/22 0940     Visit Number 18    Date for PT Re-Evaluation 06/24/22    Authorization Type Med A- KX modifier needed    Progress Note Due on Visit 20    PT Start Time 0930    PT Stop Time 1010    PT Time Calculation (min) 40 min    Activity Tolerance Patient tolerated treatment well    Behavior During Therapy [facility] for tasks assessed/performed                Past Medical History:  Diagnosis Date   Allergic rhinitis    Allergy    Anxiety    Arthritis    Breast cancer ([facility]) 04/28/2020   rigtht breast   Bronchitis    CAD (coronary artery disease)    1/19 PCI/DES to [place] for ISR, normal EF.    Cancer ([place])    hx of precancerous cells in right breast    Cataract    Cervical dysplasia    unsure of procedure, possible "burning" in her late 60s   CHF (congestive heart failure) ([place])    Echo 06/2019: EF 55-60, elevated LVEDP, normal RV SF, mild MAC, mild MR, trivial TR   Complication of anesthesia    COPD (chronic obstructive pulmonary disease) (HCC)    early    Depression    Dyspnea    with exertion    Family history of adverse reaction to anesthesia    daughter- problems wiht n/v   GERD (gastroesophageal reflux disease)    Gout    Headache(784.0)    Heart murmur    hx of years ago    Hyperlipidemia    Hypertension    Low back pain    Menopausal syndrome    Myocardial infarct ([place]) 2007   hx of   Overactive bladder    PONV (postoperative nausea and vomiting)    Sleep apnea    Past Surgical History:  Procedure Laterality Date   ANGIOPLASTY     stent 2007   BREAST LUMPECTOMY WITH RADIOACTIVE SEED LOCALIZATION Right 04/28/2020   Procedure: RIGHT BREAST LUMPECTOMY X 2  WITH RADIOACTIVE SEED  LOCALIZATION;  Surgeon: Donnie Mesa, MD;  Location: [place];  Service: General;  Laterality: Right;  LMA   CARDIAC CATHETERIZATION     COLONOSCOPY     CORONARY STENT INTERVENTION N/A 11/06/2017   Procedure: CORONARY STENT INTERVENTION;  Surgeon: [name], MD;  Location: [place] [facility];  Service: Cardiovascular;  Laterality: N/A;   LEFT HEART CATH AND CORONARY ANGIOGRAPHY N/A 11/06/2017   Procedure: LEFT HEART CATH AND CORONARY ANGIOGRAPHY;  Surgeon: [name], MD;  Location: [place] [facility];  Service: Cardiovascular;  Laterality: N/A;   LEFT HEART CATH AND CORONARY ANGIOGRAPHY N/A 07/24/2019   Procedure: LEFT HEART CATH AND CORONARY ANGIOGRAPHY;  Surgeon: Burnell Blanks, MD;  Location: [place] [facility];  Service: Cardiovascular;  Laterality: N/A;   RIGHT/LEFT HEART CATH AND CORONARY ANGIOGRAPHY N/A 09/29/2020   Procedure: RIGHT/LEFT HEART CATH AND CORONARY ANGIOGRAPHY;  Surgeon: Belva Crome, MD;  Location: [place] [facility];  Service: Cardiovascular;  Laterality: N/A;   ROBOTIC ASSISTED BILATERAL SALPINGO OOPHERECTOMY Bilateral 11/10/2020  Procedure: XI ROBOTIC ASSISTED BILATERAL SALPINGO OOPHORECTOMY WITH MINI LAPAROTOMY FOR DRAINAGE;  Surgeon: Lafonda Mosses, MD;  Location: [facility];  Service: Gynecology;  Laterality: Bilateral;  MINI LAP FIRST   TONSILLECTOMY     VULVECTOMY N/A 11/10/2020   Procedure: WIDE EXCISION VULVECTOMY;  Surgeon: Lafonda Mosses, MD;  Location: [facility];  Service: Gynecology;  Laterality: N/A;   Patient Active Problem List   Diagnosis Date Noted   Dizziness 01/19/2022   Frequent falls 01/19/2022   Tremor of both hands 08/16/2021   Aortic atherosclerosis ([place]) 06/15/2021   Generalized osteoarthritis of multiple sites 12/25/2020   Chronic pain disorder 12/25/2020   Pelvic mass in female    Bilateral lower extremity edema 04/13/2020   Shortness of breath    Anxiety disorder 10/29/2018   CKD (chronic kidney  disease), stage III ([place]) 06/27/2018   COPD (chronic obstructive pulmonary disease) ([place]) 05/25/2018   Obesity 03/20/2018   Dyspnea on exertion 11/06/2017   Abnormal stress test 11/06/2017   Chronic diastolic CHF (congestive heart failure) ([place]) 10/03/2017   Chronic combined systolic and diastolic heart failure ([place]) 09/04/2017   Left ventricular dysfunction 07/31/2017   Cataract, nuclear sclerotic senile, bilateral 02/01/2017   Gouty arthritis of toe of left foot 08/09/2016   Hyperuricemia 08/09/2016   Osteoarthritis (arthritis due to wear and tear of joints) 01/14/2014   Coronary artery disease 08/02/2011   Mitral regurgitation 08/02/2011   OVERACTIVE BLADDER 07/02/2010   ACUTE CYSTITIS 05/25/2010   Pure hypercholesterolemia 08/11/2009   PEDAL EDEMA 07/22/2009   GERD 04/11/2008   Essential hypertension 05/30/2007   MYOCARDIAL INFARCTION, HX OF 05/30/2007   Allergic rhinitis 05/30/2007   LOW BACK PAIN 05/30/2007    REFERRING DIAG: M25.511,G89.29 (ICD-10-CM) - Chronic right shoulder pain   THERAPY DIAG:  Chronic right shoulder pain  Muscle weakness (generalized)  Unsteadiness on feet  Difficulty in walking, not elsewhere classified  Dizziness and giddiness  Repeated falls  Rationale for Evaluation and Treatment Rehabilitation  PERTINENT HISTORY: Dizziness, CVA, Hx of falls, OA  PRECAUTIONS: Fall Risk  SUBJECTIVE: Pt states that she has a pain patch on her shoulder, but it is feeling a little better today.  Pt agreeable to trying ionopatch today and removes her other patch.  PAIN:  Are you having pain? Yes: NPRS scale: 5/10 Pain location: right shoulder Pain description: burning Aggravating factors: use Relieving factors: rest  PATIENT GOALS:  Pt would like to be able to move around better without pain.  OBJECTIVE: (objective measures completed at initial evaluation unless otherwise dated)  DIAGNOSTIC FINDINGS:  01/16/2022 CT of cervical spine:  1. No acute  fracture or traumatic subluxation of the cervical spine.  2. Stable 3 mm right upper lobe nodule. No follow-up needed if patient is low-risk.This recommendation follows the consensus statement: Guidelines for Management of Incidental Pulmonary Nodules Detected on CT Images: From the Fleischner Society 2017; Radiology 2017; 284:228-243.   PATIENT SURVEYS:  03/09/2022: FOTO 51% (projected 58% by visit 11) 04/12/2022:  FOTO 59%   COGNITION:           Overall cognitive status: Within functional limits for tasks assessed                                  SENSATION: Light touch: WFL   POSTURE: Forward head, rounded shoulders   UPPER EXTREMITY ROM:    Active ROM Right 03/09/22 Right 04/14/22 Right 04/28/22 Left  03/09/22  Shoulder flexion 115 128 130 137  Shoulder extension        Shoulder abduction 116 125 127 125  Shoulder adduction        Shoulder internal rotation        Shoulder external rotation        Elbow flexion        Elbow extension        Wrist flexion        Wrist extension        Wrist ulnar deviation        Wrist radial deviation        Wrist pronation        Wrist supination        (Blank rows = not tested)   UPPER EXTREMITY MMT:   MMT Right 03/09/2022 Right 04/12/2022 Left 03/09/2022  Shoulder flexion 4 4+ 5  Shoulder extension       Shoulder abduction _0 Shoulder adduction       Shoulder internal rotation 4 4+ 5  Shoulder external rotation _1 Middle trapezius       Lower trapezius       Elbow flexion       Elbow extension       Wrist flexion       Wrist extension       Wrist ulnar deviation       Wrist radial deviation       Wrist pronation       Wrist supination       Grip strength (lbs)       (Blank rows = not tested)   SHOULDER SPECIAL TESTS:            Impingement tests: Hawkins/Kennedy impingement test: positive right            Rotator cuff assessment: Empty can test: negative   JOINT MOBILITY TESTING:  Crepetis noted in right  shoulder   PALPATION:  Tender to palpation along shoulder joint   FUNCTIONAL TESTING: 03/09/2022: 5 times sit to/from stand:  18.7 seconds with UE use TUG 19.3 sec with [facility]               03/29/22: 5x sit to stand: 23 seconds (pt reports dizziness today)    04/05/22: TUG 14.5 seconds   04/12/2022:   5 times sit to/from stand:  14.4 seconds with UE use TUG in 12.97 sec without assistive device  04/28/2022: 5 times sit to/from stand:  14.2 seconds with UE use  TODAY'S TREATMENT:   05/11/2022: Nustep level 5 x6 min with PT present to discuss status Seated with red theraband: shoulder ER, horizontal abduction.  BUE 2x10 Shoulder flexion and chest press with 2# dumbbells 2x12 each bilat  Right shoulder IR towel stretch 4M27 sec Application of iontopatch with Dexamethasone with instructions on use and to remove after 4 hours. Standing on foam performing vertical and horizontal head turns 2x1 min each Ambulation down hallway with SPC with horizontal and vertical head turns x1 lap each  05/05/2022: Nustep level 5 x6 min with PT present to discuss status Seated with red theraband: shoulder ER, horizontal abduction.  BUE 2x10 Shoulder flexion and chest press with 2# dumbbells 2x10 each bilat Right shoulder IR towel stretch 3x20 sec Wall wash for flexion and abduction 2x10 RUE  05/03/2022: Nustep level 5 x6 min with PT present to discuss status Seated with red theraband: shoulder ER, horizontal abduction.  BUE 2x10  Seated with red theraband:  shoulder diagonals BUE 2x10 each direction Shoulder flexion and chest press with 2# dumbbells 2x10 each bilat Standing rows and shoulder extension with green tband 2x10 with cuing for technique and posture Right shoulder IR towel stretch 3x20 sec Wall wash for flexion and abduction 2x10 RUE Standing on foam performing vertical and horizontal head turns 2x1 min each     PATIENT EDUCATION: Education details: Issued HEP and provided with yellow  tband Person educated: Patient Education method: Explanation, Demonstration, and Handouts Education comprehension: verbalized understanding and returned demonstration     HOME EXERCISE PROGRAM: Access Code: [email or web address] URL: [email or web address] Date: 03/25/2022 Prepared by: Shelby Dubin Qadir Folks  Exercises - Shoulder External Rotation and Scapular Retraction with Resistance  - 1-2 x daily - 7 x weekly - 2 sets - 10 reps - Standing Shoulder Horizontal Abduction with Resistance  - 1-2 x daily - 7 x weekly - 2 sets - 10 reps - Seated Scapular Retraction  - 1-2 x daily - 7 x weekly - 2 sets - 10 reps - Seated Upper Trapezius Stretch  - 1 x daily - 7 x weekly - 1 sets - 2 reps - 20 sec hold - Seated Levator Scapulae Stretch  - 1 x daily - 7 x weekly - 1 sets - 2 reps - 20 sec hold   ASSESSMENT:   CLINICAL IMPRESSION: Ms Ganus presents to skilled rehabilitation with reports of still having pain, but decreased pain.  Pt states that she was able to hook her bra without having increased pain once.  Applied iontopatch today to assess if this will help with further pain management.  Pt verbalizes understanding to remove in 4 hours.  Pt with one loss of balance during head turns standing on blue foam, requiring mod A to regain balance.  Pt continues to require skilled PT to address her functional impairments.     OBJECTIVE IMPAIRMENTS decreased balance, difficulty walking, decreased ROM, decreased strength, dizziness, increased muscle spasms, impaired UE functional use, postural dysfunction, and pain.    ACTIVITY LIMITATIONS carrying and lifting   PARTICIPATION LIMITATIONS: cleaning and driving   PERSONAL FACTORS Time since onset of injury/illness/exacerbation and 3+ comorbidities: COPD, CHF, HTN, OA  are also affecting patient's functional outcome.    REHAB POTENTIAL: Good   CLINICAL DECISION MAKING: Evolving/moderate complexity   EVALUATION COMPLEXITY: Moderate     GOALS: Goals  reviewed with patient? Yes   SHORT TERM GOALS: Target date: 03/30/2022     Pt will be independent with initial HEP. Baseline: Goal status: Goal Met 03/25/2022   2.  Pt will report at least a 40% improvement in symptoms since starting PT. Baseline: 40-45%  (04/05/22) Goal status: MET     LONG TERM GOALS: Target date: 06/24/2022     Pt will be independent with advanced HEP. Baseline:  Goal status: Ongoing   2.  Pt will increase FOTO to 58% to demonstrate her improvements in functional mobility. Baseline: 51% Goal status: Goal Met on 04/12/2022   3.  Pt will report being able to fasten/unfasten her bra without increased pain. Baseline: burning pain (04/05/22) Goal status: In progress (see above)   4.  Pt will decrease time on TUG to 14 seconds or less without assistive device and good safety to decrease risk of falling. Baseline: 14.5 seconds (04/05/22) Goal status: Goal Met 04/12/2022 (12.97 sec)   5.  Pt will increase right shoulder strength to at least 4+/5 to allow her to carry groceries without difficulty.  Baseline:  Goal status: Ongoing (see above)   6.  Pt will state no dizziness with functional tasks, including reaching during grocery shopping for at least the week prior to discharge. Baseline: Pt reports occasionally having dizziness and losing balance (04/28/22) Goal status: Ongoing  7.  Pt will be able to put away dishes in her home with pain no greater than 2/10.  Baseline:  Pain 4/10  Goal status:  INITIAL/NEW     PLAN: PT FREQUENCY: 2x/week   PT DURATION: 8 weeks   PLANNED INTERVENTIONS: Therapeutic exercises, Therapeutic activity, Neuromuscular re-education, Balance training, Gait training, Patient/Family education, Joint manipulation, Joint mobilization, Stair training, Vestibular training, Aquatic Therapy, Dry Needling, Electrical stimulation, Spinal manipulation, Spinal mobilization, Cryotherapy, Moist heat, Taping, Vasopneumatic device, Traction, Ultrasound,  Ionotophoresis 73m/ml Dexamethasone, Manual therapy, and Re-evaluation   PLAN FOR NEXT SESSION:  Assess response to ionto patch and apply iontopatch #2 if indicated, Rt shoulder and scapular strength, balance as indicated     SJuel Burrow PT 05/11/22 10:26 AM   [facility] [address] [place] [place] [address]Phone # [phone] [phone]

## 2022-05-11 NOTE — Patient Instructions (Signed)
IONTOPHORESIS PATIENT PRECAUTIONS & CONTRAINDICATIONS:  Redness under one or both electrodes can occur.  This characterized by a uniform redness that usually disappears within 12 hours of treatment. Small pinhead size blisters may result in response to the drug.  Contact your physician if the problem persists more than 24 hours. On rare occasions, iontophoresis therapy can result in temporary skin reactions such as rash, inflammation, irritation or burns.  The skin reactions may be the result of individual sensitivity to the ionic solution used, the condition of the skin at the start of treatment, reaction to the materials in the electrodes, allergies or sensitivity to dexamethasone, or a poor connection between the patch and your skin.  Discontinue using iontophoresis if you have any of these reactions and report to your therapist. Remove the Patch or electrodes if you have any undue sensation of pain or burning during the treatment and report discomfort to your therapist. Tell your Therapist if you have had known adverse reactions to the application of electrical current. If using the Patch, the LED light will turn off when treatment is complete and the patch can be removed.  Approximate treatment time is 1-3 hours.  Remove the patch when light goes off or after 6 hours. The Patch can be worn during normal activity, however excessive motion where the electrodes have been placed can cause poor contact between the skin and the electrode or uneven electrical current resulting in greater risk of skin irritation. Keep out of the reach of children.  [facility] [address], [place] [address] [place], [place] [address] Phone # [phone] Fax [phone]    DO NOT use if you have a cardiac pacemaker or any other electrically sensitive implanted device. DO NOT use if you have a known sensitivity to dexamethasone. DO NOT use during Magnetic Resonance Imaging (MRI). DO NOT use  over broken or compromised skin (e.g. sunburn, cuts, or acne) due to the increased risk of skin reaction. DO NOT SHAVE over the area to be treated:  To establish good contact between the Patch and the skin, excessive hair may be clipped. DO NOT place the Patch or electrodes on or over your eyes, directly over your heart, or brain. DO NOT reuse the Patch or electrodes as this may cause burns to occur.

## 2022-05-13 ENCOUNTER — Ambulatory Visit: Payer: Medicare Other | Admitting: Rehabilitative and Restorative Service Providers"

## 2022-05-13 ENCOUNTER — Encounter: Payer: Self-pay | Admitting: Rehabilitative and Restorative Service Providers"

## 2022-05-13 DIAGNOSIS — R2681 Unsteadiness on feet: Secondary | ICD-10-CM

## 2022-05-13 DIAGNOSIS — M6281 Muscle weakness (generalized): Secondary | ICD-10-CM | POA: Diagnosis not present

## 2022-05-13 DIAGNOSIS — G8929 Other chronic pain: Secondary | ICD-10-CM | POA: Diagnosis not present

## 2022-05-13 DIAGNOSIS — R42 Dizziness and giddiness: Secondary | ICD-10-CM | POA: Diagnosis not present

## 2022-05-13 DIAGNOSIS — M25511 Pain in right shoulder: Secondary | ICD-10-CM | POA: Diagnosis not present

## 2022-05-13 DIAGNOSIS — R296 Repeated falls: Secondary | ICD-10-CM

## 2022-05-13 DIAGNOSIS — R262 Difficulty in walking, not elsewhere classified: Secondary | ICD-10-CM | POA: Diagnosis not present

## 2022-05-13 NOTE — Therapy (Signed)
OUTPATIENT PHYSICAL THERAPY TREATMENT NOTE   Patient Name: Tracy [name] MRN: [id number] DOB:11/24/1945, 76 y.o., female Today's Date: 05/13/2022  PCP: [name], [name], MD  REFERRING PROVIDER: [name], [name], MD     END OF SESSION:   PT End of Session - 05/13/22 1016     Visit Number 19    Date for PT Re-Evaluation 06/24/22    Authorization Type Med A- KX modifier needed    Progress Note Due on Visit 20    PT Start Time 1010    PT Stop Time 1050    PT Time Calculation (min) 40 min    Activity Tolerance Patient tolerated treatment well    Behavior During Therapy WFL for tasks assessed/performed                Past Medical History:  Diagnosis Date   Allergic rhinitis    Allergy    Anxiety    Arthritis    Breast cancer ([place]) 04/28/2020   rigtht breast   Bronchitis    CAD (coronary artery disease)    1/19 PCI/DES to [place] for ISR, normal EF.    Cancer ([place])    hx of precancerous cells in right breast    Cataract    Cervical dysplasia    unsure of procedure, possible "burning" in her late 83s   CHF (congestive heart failure) ([place])    Echo 06/2019: EF 55-60, elevated LVEDP, normal RV SF, mild MAC, mild MR, trivial TR   Complication of anesthesia    COPD (chronic obstructive pulmonary disease) ([facility])    early    Depression    Dyspnea    with exertion    Family history of adverse reaction to anesthesia    daughter- problems wiht n/v   GERD (gastroesophageal reflux disease)    Gout    Headache(784.0)    Heart murmur    hx of years ago    Hyperlipidemia    Hypertension    Low back pain    Menopausal syndrome    Myocardial infarct ([place]) 2007   hx of   Overactive bladder    PONV (postoperative nausea and vomiting)    Sleep apnea    Past Surgical History:  Procedure Laterality Date   ANGIOPLASTY     stent 2007   BREAST LUMPECTOMY WITH RADIOACTIVE SEED LOCALIZATION Right 04/28/2020   Procedure: RIGHT BREAST LUMPECTOMY X 2  WITH RADIOACTIVE SEED  LOCALIZATION;  Surgeon: Donnie Mesa, MD;  Location: [facility];  Service: General;  Laterality: Right;  LMA   CARDIAC CATHETERIZATION     COLONOSCOPY     CORONARY STENT INTERVENTION N/A 11/06/2017   Procedure: CORONARY STENT INTERVENTION;  Surgeon: [name], MD;  Location: [facility];  Service: Cardiovascular;  Laterality: N/A;   LEFT HEART CATH AND CORONARY ANGIOGRAPHY N/A 11/06/2017   Procedure: LEFT HEART CATH AND CORONARY ANGIOGRAPHY;  Surgeon: [name], MD;  Location: [facility];  Service: Cardiovascular;  Laterality: N/A;   LEFT HEART CATH AND CORONARY ANGIOGRAPHY N/A 07/24/2019   Procedure: LEFT HEART CATH AND CORONARY ANGIOGRAPHY;  Surgeon: Burnell Blanks, MD;  Location: [facility];  Service: Cardiovascular;  Laterality: N/A;   RIGHT/LEFT HEART CATH AND CORONARY ANGIOGRAPHY N/A 09/29/2020   Procedure: RIGHT/LEFT HEART CATH AND CORONARY ANGIOGRAPHY;  Surgeon: Belva Crome, MD;  Location: [facility];  Service: Cardiovascular;  Laterality: N/A;   ROBOTIC ASSISTED BILATERAL SALPINGO OOPHERECTOMY Bilateral 11/10/2020  Procedure: XI ROBOTIC ASSISTED BILATERAL SALPINGO OOPHORECTOMY WITH MINI LAPAROTOMY FOR DRAINAGE;  Surgeon: Lafonda Mosses, MD;  Location: [facility];  Service: Gynecology;  Laterality: Bilateral;  MINI LAP FIRST   TONSILLECTOMY     VULVECTOMY N/A 11/10/2020   Procedure: WIDE EXCISION VULVECTOMY;  Surgeon: Lafonda Mosses, MD;  Location: [facility];  Service: Gynecology;  Laterality: N/A;   Patient Active Problem List   Diagnosis Date Noted   Dizziness 01/19/2022   Frequent falls 01/19/2022   Tremor of both hands 08/16/2021   Aortic atherosclerosis ([place]) 06/15/2021   Generalized osteoarthritis of multiple sites 12/25/2020   Chronic pain disorder 12/25/2020   Pelvic mass in female    Bilateral lower extremity edema 04/13/2020   Shortness of breath    Anxiety disorder 10/29/2018   CKD (chronic kidney  disease), stage III ([place]) 06/27/2018   COPD (chronic obstructive pulmonary disease) ([place]) 05/25/2018   Obesity 03/20/2018   Dyspnea on exertion 11/06/2017   Abnormal stress test 11/06/2017   Chronic diastolic CHF (congestive heart failure) ([place]) 10/03/2017   Chronic combined systolic and diastolic heart failure ([place]) 09/04/2017   Left ventricular dysfunction 07/31/2017   Cataract, nuclear sclerotic senile, bilateral 02/01/2017   Gouty arthritis of toe of left foot 08/09/2016   Hyperuricemia 08/09/2016   Osteoarthritis (arthritis due to wear and tear of joints) 01/14/2014   Coronary artery disease 08/02/2011   Mitral regurgitation 08/02/2011   OVERACTIVE BLADDER 07/02/2010   ACUTE CYSTITIS 05/25/2010   Pure hypercholesterolemia 08/11/2009   PEDAL EDEMA 07/22/2009   GERD 04/11/2008   Essential hypertension 05/30/2007   MYOCARDIAL INFARCTION, HX OF 05/30/2007   Allergic rhinitis 05/30/2007   LOW BACK PAIN 05/30/2007    REFERRING DIAG: M25.511,G89.29 (ICD-10-CM) - Chronic right shoulder pain   THERAPY DIAG:  Chronic right shoulder pain  Muscle weakness (generalized)  Unsteadiness on feet  Difficulty in walking, not elsewhere classified  Dizziness and giddiness  Repeated falls  Rationale for Evaluation and Treatment Rehabilitation  PERTINENT HISTORY: Dizziness, CVA, Hx of falls, OA  PRECAUTIONS: Fall Risk  SUBJECTIVE: Pt states that the iontopatch seemed to help a lot when she was wearing it and would like to use again.  PAIN:  Are you having pain? Yes: NPRS scale: 5/10 Pain location: right shoulder Pain description: burning Aggravating factors: use Relieving factors: rest  PATIENT GOALS:  Pt would like to be able to move around better without pain.  OBJECTIVE: (objective measures completed at initial evaluation unless otherwise dated)  DIAGNOSTIC FINDINGS:  01/16/2022 CT of cervical spine:  1. No acute fracture or traumatic subluxation of the cervical spine.  2.  Stable 3 mm right upper lobe nodule. No follow-up needed if patient is low-risk.This recommendation follows the consensus statement: Guidelines for Management of Incidental Pulmonary Nodules Detected on CT Images: From the Fleischner Society 2017; Radiology 2017; 284:228-243.   PATIENT SURVEYS:  03/09/2022: FOTO 51% (projected 58% by visit 11) 04/12/2022:  FOTO 59%   COGNITION:           Overall cognitive status: Within functional limits for tasks assessed                                  SENSATION: Light touch: WFL   POSTURE: Forward head, rounded shoulders   UPPER EXTREMITY ROM:    Active ROM Right 03/09/22 Right 04/14/22 Right 04/28/22 Left 03/09/22  Shoulder flexion 115 128 130 137  Shoulder  extension        Shoulder abduction 116 125 127 125  Shoulder adduction        Shoulder internal rotation        Shoulder external rotation        Elbow flexion        Elbow extension        Wrist flexion        Wrist extension        Wrist ulnar deviation        Wrist radial deviation        Wrist pronation        Wrist supination        (Blank rows = not tested)   UPPER EXTREMITY MMT:   MMT Right 03/09/2022 Right 04/12/2022 Left 03/09/2022  Shoulder flexion 4 4+ 5  Shoulder extension       Shoulder abduction _0 Shoulder adduction       Shoulder internal rotation 4 4+ 5  Shoulder external rotation _1 Middle trapezius       Lower trapezius       Elbow flexion       Elbow extension       Wrist flexion       Wrist extension       Wrist ulnar deviation       Wrist radial deviation       Wrist pronation       Wrist supination       Grip strength (lbs)       (Blank rows = not tested)   SHOULDER SPECIAL TESTS:            Impingement tests: Hawkins/Kennedy impingement test: positive right            Rotator cuff assessment: Empty can test: negative   JOINT MOBILITY TESTING:  Crepetis noted in right shoulder   PALPATION:  Tender to palpation along shoulder  joint   FUNCTIONAL TESTING: 03/09/2022: 5 times sit to/from stand:  18.7 seconds with UE use TUG 19.3 sec with [facility]               03/29/22: 5x sit to stand: 23 seconds (pt reports dizziness today)    04/05/22: TUG 14.5 seconds   04/12/2022:   5 times sit to/from stand:  14.4 seconds with UE use TUG in 12.97 sec without assistive device  04/28/2022: 5 times sit to/from stand:  14.2 seconds with UE use  TODAY'S TREATMENT:   05/13/2022: Nustep level 5 x6 min with PT present to discuss status Seated with red theraband: shoulder ER, horizontal abduction.  BUE 2x10 Shoulder flexion and chest press with 2# dumbbells 2x12 each bilat  Right shoulder IR towel stretch 4Y70 sec Application of iontopatch with Dexamethasone with instructions on use and to remove after 4 hours. Wall wash for flexion and abduction 2x10 RUE each Standing rows and extension with green tband 2x10 Wall push ups 2x10  05/11/2022: Nustep level 5 x6 min with PT present to discuss status Seated with red theraband: shoulder ER, horizontal abduction.  BUE 2x10 Shoulder flexion and chest press with 2# dumbbells 2x12 each bilat  Right shoulder IR towel stretch 6C37 sec Application of iontopatch with Dexamethasone with instructions on use and to remove after 4 hours. Standing on foam performing vertical and horizontal head turns 2x1 min each Ambulation down hallway with SPC with horizontal and vertical head turns x1 lap each  05/05/2022: Nustep level 5  x6 min with PT present to discuss status Seated with red theraband: shoulder ER, horizontal abduction.  BUE 2x10 Shoulder flexion and chest press with 2# dumbbells 2x10 each bilat Right shoulder IR towel stretch 3x20 sec Wall wash for flexion and abduction 2x10 RUE    PATIENT EDUCATION: Education details: Issued HEP and provided with yellow tband Person educated: Patient Education method: Explanation, Demonstration, and Handouts Education comprehension: verbalized  understanding and returned demonstration     HOME EXERCISE PROGRAM: Access Code: [email or web address] URL: [email or web address] Date: 03/25/2022 Prepared by: Shelby Dubin Hilde Churchman  Exercises - Shoulder External Rotation and Scapular Retraction with Resistance  - 1-2 x daily - 7 x weekly - 2 sets - 10 reps - Standing Shoulder Horizontal Abduction with Resistance  - 1-2 x daily - 7 x weekly - 2 sets - 10 reps - Seated Scapular Retraction  - 1-2 x daily - 7 x weekly - 2 sets - 10 reps - Seated Upper Trapezius Stretch  - 1 x daily - 7 x weekly - 1 sets - 2 reps - 20 sec hold - Seated Levator Scapulae Stretch  - 1 x daily - 7 x weekly - 1 sets - 2 reps - 20 sec hold   ASSESSMENT:   CLINICAL IMPRESSION: Ms Starlin presents to skilled rehabilitation with reports of still having pain, but with the patch as decreased pain.  Pt reports overall progress of 45-50% improvement since initial evaluation.  Pt continues to progress with increased strengthening and improved A/ROM.  Pt has been performing HEP, including vestibular at home.  Pt continues to require skilled PT to progress towards goal related activities.     OBJECTIVE IMPAIRMENTS decreased balance, difficulty walking, decreased ROM, decreased strength, dizziness, increased muscle spasms, impaired UE functional use, postural dysfunction, and pain.    ACTIVITY LIMITATIONS carrying and lifting   PARTICIPATION LIMITATIONS: cleaning and driving   PERSONAL FACTORS Time since onset of injury/illness/exacerbation and 3+ comorbidities: COPD, CHF, HTN, OA  are also affecting patient's functional outcome.    REHAB POTENTIAL: Good   CLINICAL DECISION MAKING: Evolving/moderate complexity   EVALUATION COMPLEXITY: Moderate     GOALS: Goals reviewed with patient? Yes   SHORT TERM GOALS: Target date: 03/30/2022     Pt will be independent with initial HEP. Baseline: Goal status: Goal Met 03/25/2022   2.  Pt will report at least a 40% improvement in  symptoms since starting PT. Baseline: 40-45%  (04/05/22) Goal status: MET     LONG TERM GOALS: Target date: 06/24/2022     Pt will be independent with advanced HEP. Baseline:  Goal status: Ongoing   2.  Pt will increase FOTO to 58% to demonstrate her improvements in functional mobility. Baseline: 51% Goal status: Goal Met on 04/12/2022   3.  Pt will report being able to fasten/unfasten her bra without increased pain. Baseline: burning pain (04/05/22) Goal status: In progress (see above)   4.  Pt will decrease time on TUG to 14 seconds or less without assistive device and good safety to decrease risk of falling. Baseline: 14.5 seconds (04/05/22) Goal status: Goal Met 04/12/2022 (12.97 sec)   5.  Pt will increase right shoulder strength to at least 4+/5 to allow her to carry groceries without difficulty. Baseline:  Goal status: Ongoing (see above)   6.  Pt will state no dizziness with functional tasks, including reaching during grocery shopping for at least the week prior to discharge. Baseline: Pt reports occasionally having dizziness and losing balance (04/28/22)  Goal status: Ongoing  7.  Pt will be able to put away dishes in her home with pain no greater than 2/10.  Baseline:  Pain 4/10  Goal status:  INITIAL/NEW     PLAN: PT FREQUENCY: 2x/week   PT DURATION: 8 weeks   PLANNED INTERVENTIONS: Therapeutic exercises, Therapeutic activity, Neuromuscular re-education, Balance training, Gait training, Patient/Family education, Joint manipulation, Joint mobilization, Stair training, Vestibular training, Aquatic Therapy, Dry Needling, Electrical stimulation, Spinal manipulation, Spinal mobilization, Cryotherapy, Moist heat, Taping, Vasopneumatic device, Traction, Ultrasound, Ionotophoresis 68m/ml Dexamethasone, Manual therapy, and Re-evaluation   PLAN FOR NEXT SESSION:  Assess response to ionto patch and apply iontopatch #3 if indicated, Rt shoulder and scapular strength, balance as  indicated     SJuel Burrow PT 05/13/22 10:51 AM   [facility] [address] STower CityGM[place] [address]Phone # [phone] [phone]

## 2022-05-16 ENCOUNTER — Other Ambulatory Visit: Payer: Self-pay | Admitting: Physician Assistant

## 2022-05-17 ENCOUNTER — Other Ambulatory Visit: Payer: Self-pay | Admitting: Physician Assistant

## 2022-05-17 NOTE — Telephone Encounter (Signed)
Patient called stating she is going out of town. She would like a rush on this.

## 2022-05-17 NOTE — Telephone Encounter (Signed)
Pt's pharmacy is requesting a refill on nitroglycerin. This medication is no longer on pt's medication list. Would Dr. Burt Knack like to reorder this medication? Please address

## 2022-05-18 ENCOUNTER — Encounter: Payer: Medicare Other | Admitting: Rehabilitative and Restorative Service Providers"

## 2022-05-20 ENCOUNTER — Encounter: Payer: Medicare Other | Admitting: Rehabilitative and Restorative Service Providers"

## 2022-05-24 ENCOUNTER — Ambulatory Visit: Payer: Medicare Other | Admitting: Rehabilitative and Restorative Service Providers"

## 2022-05-26 ENCOUNTER — Ambulatory Visit: Payer: Medicare Other

## 2022-05-31 ENCOUNTER — Ambulatory Visit: Payer: Medicare Other | Attending: Family Medicine | Admitting: Rehabilitative and Restorative Service Providers"

## 2022-05-31 ENCOUNTER — Encounter: Payer: Self-pay | Admitting: Rehabilitative and Restorative Service Providers"

## 2022-05-31 DIAGNOSIS — R2681 Unsteadiness on feet: Secondary | ICD-10-CM

## 2022-05-31 DIAGNOSIS — G8929 Other chronic pain: Secondary | ICD-10-CM | POA: Diagnosis not present

## 2022-05-31 DIAGNOSIS — M6281 Muscle weakness (generalized): Secondary | ICD-10-CM | POA: Diagnosis not present

## 2022-05-31 DIAGNOSIS — M25511 Pain in right shoulder: Secondary | ICD-10-CM | POA: Diagnosis not present

## 2022-05-31 DIAGNOSIS — R296 Repeated falls: Secondary | ICD-10-CM | POA: Diagnosis not present

## 2022-05-31 DIAGNOSIS — R42 Dizziness and giddiness: Secondary | ICD-10-CM | POA: Diagnosis not present

## 2022-05-31 DIAGNOSIS — R262 Difficulty in walking, not elsewhere classified: Secondary | ICD-10-CM

## 2022-05-31 NOTE — Therapy (Signed)
OUTPATIENT PHYSICAL THERAPY TREATMENT NOTE   Patient Name: Tracy [name] MRN: [id number] DOB:12-30-1945, 76 y.o., female Today's Date: 05/31/2022  PCP: [name], [name], MD  REFERRING PROVIDER: [name], [name], MD   Progress Note Reporting Period 03/09/2022 to 05/31/2022  See note below for Objective Data and Assessment of Progress/Goals.      END OF SESSION:   PT End of Session - 05/31/22 0934     Visit Number 20    Date for PT Re-Evaluation 06/24/22    Authorization Type Med A- KX modifier needed    Progress Note Due on Visit 30    PT Start Time 0930    PT Stop Time 1010    PT Time Calculation (min) 40 min    Activity Tolerance Patient tolerated treatment well    Behavior During Therapy WFL for tasks assessed/performed                Past Medical History:  Diagnosis Date   Allergic rhinitis    Allergy    Anxiety    Arthritis    Breast cancer ([place]) 04/28/2020   rigtht breast   Bronchitis    CAD (coronary artery disease)    1/19 PCI/DES to [facility] for ISR, normal EF.    Cancer ([place])    hx of precancerous cells in right breast    Cataract    Cervical dysplasia    unsure of procedure, possible "burning" in her late 13s   CHF (congestive heart failure) ([place])    Echo 06/2019: EF 55-60, elevated LVEDP, normal RV SF, mild MAC, mild MR, trivial TR   Complication of anesthesia    COPD (chronic obstructive pulmonary disease) (H[facility])    early    Depression    Dyspnea    with exertion    Family history of adverse reaction to anesthesia    daughter- problems wiht n/v   GERD (gastroesophageal reflux disease)    Gout    Headache(784.0)    Heart murmur    hx of years ago    Hyperlipidemia    Hypertension    Low back pain    Menopausal syndrome    Myocardial infarct ([place]) 2007   hx of   Overactive bladder    PONV (postoperative nausea and vomiting)    Sleep apnea    Past Surgical History:  Procedure Laterality Date   ANGIOPLASTY     stent 2007    BREAST LUMPECTOMY WITH RADIOACTIVE SEED LOCALIZATION Right 04/28/2020   Procedure: RIGHT BREAST LUMPECTOMY X 2  WITH RADIOACTIVE SEED LOCALIZATION;  Surgeon: Donnie Mesa, MD;  Location: [facility];  Service: General;  Laterality: Right;  LMA   CARDIAC CATHETERIZATION     COLONOSCOPY     CORONARY STENT INTERVENTION N/A 11/06/2017   Procedure: CORONARY STENT INTERVENTION;  Surgeon: [name], MD;  Location: [facility];  Service: Cardiovascular;  Laterality: N/A;   LEFT HEART CATH AND CORONARY ANGIOGRAPHY N/A 11/06/2017   Procedure: LEFT HEART CATH AND CORONARY ANGIOGRAPHY;  Surgeon: [name], MD;  Location: [facility];  Service: Cardiovascular;  Laterality: N/A;   LEFT HEART CATH AND CORONARY ANGIOGRAPHY N/A 07/24/2019   Procedure: LEFT HEART CATH AND CORONARY ANGIOGRAPHY;  Surgeon: Burnell Blanks, MD;  Location: [facility];  Service: Cardiovascular;  Laterality: N/A;   RIGHT/LEFT HEART CATH AND CORONARY ANGIOGRAPHY N/A 09/29/2020   Procedure: RIGHT/LEFT HEART CATH AND CORONARY ANGIOGRAPHY;  Surgeon: Belva Crome, MD;  Location: [facility];  Service: Cardiovascular;  Laterality: N/A;   ROBOTIC ASSISTED BILATERAL SALPINGO OOPHERECTOMY Bilateral 11/10/2020   Procedure: XI ROBOTIC ASSISTED BILATERAL SALPINGO OOPHORECTOMY WITH MINI LAPAROTOMY FOR DRAINAGE;  Surgeon: Lafonda Mosses, MD;  Location: [facility];  Service: Gynecology;  Laterality: Bilateral;  MINI LAP FIRST   TONSILLECTOMY     VULVECTOMY N/A 11/10/2020   Procedure: WIDE EXCISION VULVECTOMY;  Surgeon: Lafonda Mosses, MD;  Location: [facility];  Service: Gynecology;  Laterality: N/A;   Patient Active Problem List   Diagnosis Date Noted   Dizziness 01/19/2022   Frequent falls 01/19/2022   Tremor of both hands 08/16/2021   Aortic atherosclerosis ([place]) 06/15/2021   Generalized osteoarthritis of multiple sites 12/25/2020   Chronic pain disorder 12/25/2020   Pelvic mass  in female    Bilateral lower extremity edema 04/13/2020   Shortness of breath    Anxiety disorder 10/29/2018   CKD (chronic kidney disease), stage III ([place]) 06/27/2018   COPD (chronic obstructive pulmonary disease) ([place]) 05/25/2018   Obesity 03/20/2018   Dyspnea on exertion 11/06/2017   Abnormal stress test 11/06/2017   Chronic diastolic CHF (congestive heart failure) ([place]) 10/03/2017   Chronic combined systolic and diastolic heart failure ([place]) 09/04/2017   Left ventricular dysfunction 07/31/2017   Cataract, nuclear sclerotic senile, bilateral 02/01/2017   Gouty arthritis of toe of left foot 08/09/2016   Hyperuricemia 08/09/2016   Osteoarthritis (arthritis due to wear and tear of joints) 01/14/2014   Coronary artery disease 08/02/2011   Mitral regurgitation 08/02/2011   OVERACTIVE BLADDER 07/02/2010   ACUTE CYSTITIS 05/25/2010   Pure hypercholesterolemia 08/11/2009   PEDAL EDEMA 07/22/2009   GERD 04/11/2008   Essential hypertension 05/30/2007   MYOCARDIAL INFARCTION, HX OF 05/30/2007   Allergic rhinitis 05/30/2007   LOW BACK PAIN 05/30/2007    REFERRING DIAG: M25.511,G89.29 (ICD-10-CM) - Chronic right shoulder pain   THERAPY DIAG:  Chronic right shoulder pain  Muscle weakness (generalized)  Unsteadiness on feet  Difficulty in walking, not elsewhere classified  Dizziness and giddiness  Repeated falls  Rationale for Evaluation and Treatment Rehabilitation  PERTINENT HISTORY: Dizziness, CVA, Hx of falls, OA  PRECAUTIONS: Fall Risk  SUBJECTIVE: Pt reports that the iontopatch does seem to be helping.  Pt missed last week, but she had a covid exposure, so she cancelled last week just to be safe.  Pt denies any symptoms or fever.  PAIN:  Are you having pain? Yes: NPRS scale: 4/10 Pain location: right shoulder Pain description: burning Aggravating factors: use Relieving factors: rest  PATIENT GOALS:  Pt would like to be able to move around better without  pain.  OBJECTIVE: (objective measures completed at initial evaluation unless otherwise dated)  DIAGNOSTIC FINDINGS:  01/16/2022 CT of cervical spine:  1. No acute fracture or traumatic subluxation of the cervical spine.  2. Stable 3 mm right upper lobe nodule. No follow-up needed if patient is low-risk.This recommendation follows the consensus statement: Guidelines for Management of Incidental Pulmonary Nodules Detected on CT Images: From the Fleischner Society 2017; Radiology 2017; 284:228-243.   PATIENT SURVEYS:  03/09/2022: FOTO 51% (projected 58% by visit 11) 04/12/2022:  FOTO 59% 05/31/2022:  FOTO 70%   COGNITION:           Overall cognitive status: Within functional limits for tasks assessed  SENSATION: Light touch: WFL   POSTURE: Forward head, rounded shoulders   UPPER EXTREMITY ROM:    Active ROM Right 03/09/22 Right 04/14/22 Right 04/28/22 Right 05/31/22 Left 03/09/22  Shoulder flexion 115 128 130 138 137  Shoulder extension         Shoulder abduction 116 125 127 138 125  Shoulder adduction         Shoulder internal rotation         Shoulder external rotation         Elbow flexion         Elbow extension         Wrist flexion         Wrist extension         Wrist ulnar deviation         Wrist radial deviation         Wrist pronation         Wrist supination         (Blank rows = not tested)   UPPER EXTREMITY MMT:   MMT Right 03/09/2022 Right 04/12/2022 Left 03/09/2022  Shoulder flexion 4 4+ 5  Shoulder extension       Shoulder abduction _0 Shoulder adduction       Shoulder internal rotation 4 4+ 5  Shoulder external rotation _1 Middle trapezius       Lower trapezius       Elbow flexion       Elbow extension       Wrist flexion       Wrist extension       Wrist ulnar deviation       Wrist radial deviation       Wrist pronation       Wrist supination       Grip strength (lbs)       (Blank rows = not tested)    SHOULDER SPECIAL TESTS:            Impingement tests: Hawkins/Kennedy impingement test: positive right            Rotator cuff assessment: Empty can test: negative   JOINT MOBILITY TESTING:  Crepetis noted in right shoulder   PALPATION:  Tender to palpation along shoulder joint   FUNCTIONAL TESTING: 03/09/2022: 5 times sit to/from stand:  18.7 seconds with UE use TUG 19.3 sec with [facility]               03/29/22: 5x sit to stand: 23 seconds (pt reports dizziness today)    04/05/22: TUG 14.5 seconds   04/12/2022:   5 times sit to/from stand:  14.4 seconds with UE use TUG in 12.97 sec without assistive device  04/28/2022: 5 times sit to/from stand:  14.2 seconds with UE use  TODAY'S TREATMENT:   05/31/2022: Nustep level 5 x6 min with PT present to discuss status FOTO 70% Seated with red theraband: shoulder ER, horizontal abduction.  BUE 2x10 Shoulder flexion and chest press with 2# dumbbells 2x12 each bilat  Standing rows and extension with green tband 2x10 Wall wash for flexion and abduction 5K09 RUE each Application of iontopatch with Dexamethasone with instructions on use and to remove after 4 hours Ambulation down hallway with SPC with horizontal and vertical head turns x1 lap each Wall push ups 2x10  05/13/2022: Nustep level 5 x6 min with PT present to discuss status Seated with red theraband: shoulder ER, horizontal abduction.  BUE 2x10 Shoulder flexion  and chest press with 2# dumbbells 2x12 each bilat  Right shoulder IR towel stretch 1Y78 sec Application of iontopatch with Dexamethasone with instructions on use and to remove after 4 hours. Wall wash for flexion and abduction 2x10 RUE each Standing rows and extension with green tband 2x10 Wall push ups 2x10  05/11/2022: Nustep level 5 x6 min with PT present to discuss status Seated with red theraband: shoulder ER, horizontal abduction.  BUE 2x10 Shoulder flexion and chest press with 2# dumbbells 2x12 each bilat  Right  shoulder IR towel stretch 2N56 sec Application of iontopatch with Dexamethasone with instructions on use and to remove after 4 hours. Standing on foam performing vertical and horizontal head turns 2x1 min each Ambulation down hallway with SPC with horizontal and vertical head turns x1 lap each     PATIENT EDUCATION: Education details: Issued HEP and provided with yellow tband Person educated: Patient Education method: Explanation, Demonstration, and Handouts Education comprehension: verbalized understanding and returned demonstration     HOME EXERCISE PROGRAM: Access Code: [email or web address] URL: [email or web address] Date: 03/25/2022 Prepared by: Shelby Dubin Jakobi Thetford  Exercises - Shoulder External Rotation and Scapular Retraction with Resistance  - 1-2 x daily - 7 x weekly - 2 sets - 10 reps - Standing Shoulder Horizontal Abduction with Resistance  - 1-2 x daily - 7 x weekly - 2 sets - 10 reps - Seated Scapular Retraction  - 1-2 x daily - 7 x weekly - 2 sets - 10 reps - Seated Upper Trapezius Stretch  - 1 x daily - 7 x weekly - 1 sets - 2 reps - 20 sec hold - Seated Levator Scapulae Stretch  - 1 x daily - 7 x weekly - 1 sets - 2 reps - 20 sec hold   ASSESSMENT:   CLINICAL IMPRESSION: Ms Whisner presents to skilled rehabilitation with reports of being able to unhook her bra without having to spin it around.  Pt states that she does still report having some dizziness, but not all of the time.  FOTO goal and shoulder A/ROM goals have been met.  Pt continues to have main limiting factor of right shoulder pain and applied during session today.  Pt continues to require skilled PT to progress towards goal related activities.     OBJECTIVE IMPAIRMENTS decreased balance, difficulty walking, decreased ROM, decreased strength, dizziness, increased muscle spasms, impaired UE functional use, postural dysfunction, and pain.    ACTIVITY LIMITATIONS carrying and lifting   PARTICIPATION  LIMITATIONS: cleaning and driving   PERSONAL FACTORS Time since onset of injury/illness/exacerbation and 3+ comorbidities: COPD, CHF, HTN, OA  are also affecting patient's functional outcome.    REHAB POTENTIAL: Good   CLINICAL DECISION MAKING: Evolving/moderate complexity   EVALUATION COMPLEXITY: Moderate     GOALS: Goals reviewed with patient? Yes   SHORT TERM GOALS: Target date: 03/30/2022     Pt will be independent with initial HEP. Baseline: Goal status: Goal Met 03/25/2022   2.  Pt will report at least a 40% improvement in symptoms since starting PT. Baseline: 40-45%  (04/05/22) Goal status: MET     LONG TERM GOALS: Target date: 06/24/2022     Pt will be independent with advanced HEP. Baseline:  Goal status: Ongoing   2.  Pt will increase FOTO to 58% to demonstrate her improvements in functional mobility. Baseline: 51% Goal status: Goal Met on 04/12/2022   3.  Pt will report being able to fasten/unfasten her bra without increased pain. Baseline: burning pain (04/05/22)  Goal status: Goal Met on 05/31/2022   4.  Pt will decrease time on TUG to 14 seconds or less without assistive device and good safety to decrease risk of falling. Baseline: 14.5 seconds (04/05/22) Goal status: Goal Met 04/12/2022 (12.97 sec)   5.  Pt will increase right shoulder strength to at least 4+/5 to allow her to carry groceries without difficulty. Baseline:  Goal status: Ongoing (see above)   6.  Pt will state no dizziness with functional tasks, including reaching during grocery shopping for at least the week prior to discharge. Baseline: Pt reports occasionally having dizziness and losing balance (04/28/22) Goal status: Ongoing  7.  Pt will be able to put away dishes in her home with pain no greater than 2/10.  Baseline:  Pain 4/10  Goal status:  Ongoing     PLAN: PT FREQUENCY: 2x/week   PT DURATION: 8 weeks   PLANNED INTERVENTIONS: Therapeutic exercises, Therapeutic activity,  Neuromuscular re-education, Balance training, Gait training, Patient/Family education, Joint manipulation, Joint mobilization, Stair training, Vestibular training, Aquatic Therapy, Dry Needling, Electrical stimulation, Spinal manipulation, Spinal mobilization, Cryotherapy, Moist heat, Taping, Vasopneumatic device, Traction, Ultrasound, Ionotophoresis 16m/ml Dexamethasone, Manual therapy, and Re-evaluation   PLAN FOR NEXT SESSION:  Assess response to ionto patch and apply iontopatch #4 if indicated, Rt shoulder and scapular strength, balance as indicated     SJuel Burrow PT 05/31/22 10:11 AM   [id number] [place] [id number] # [phone] [phone]

## 2022-06-02 ENCOUNTER — Ambulatory Visit: Payer: Medicare Other | Admitting: Rehabilitative and Restorative Service Providers"

## 2022-06-02 ENCOUNTER — Encounter: Payer: Self-pay | Admitting: Rehabilitative and Restorative Service Providers"

## 2022-06-02 DIAGNOSIS — G8929 Other chronic pain: Secondary | ICD-10-CM | POA: Diagnosis not present

## 2022-06-02 DIAGNOSIS — M25511 Pain in right shoulder: Secondary | ICD-10-CM | POA: Diagnosis not present

## 2022-06-02 DIAGNOSIS — R2681 Unsteadiness on feet: Secondary | ICD-10-CM | POA: Diagnosis not present

## 2022-06-02 DIAGNOSIS — M19011 Primary osteoarthritis, right shoulder: Secondary | ICD-10-CM | POA: Diagnosis not present

## 2022-06-02 DIAGNOSIS — R262 Difficulty in walking, not elsewhere classified: Secondary | ICD-10-CM

## 2022-06-02 DIAGNOSIS — R42 Dizziness and giddiness: Secondary | ICD-10-CM | POA: Diagnosis not present

## 2022-06-02 DIAGNOSIS — R296 Repeated falls: Secondary | ICD-10-CM

## 2022-06-02 DIAGNOSIS — M6281 Muscle weakness (generalized): Secondary | ICD-10-CM

## 2022-06-02 NOTE — Therapy (Signed)
OUTPATIENT PHYSICAL THERAPY TREATMENT NOTE   Patient Name: Tracy [name] MRN: [id number] DOB:18-Jun-1946, 76 y.o., female Today's Date: 06/02/2022  PCP: [name], [name], MD  REFERRING PROVIDER: [name], [name], MD   Progress Note Reporting Period 03/09/2022 to 05/31/2022  See note below for Objective Data and Assessment of Progress/Goals.      END OF SESSION:   PT End of Session - 06/02/22 0939     Visit Number 21    Date for PT Re-Evaluation 06/24/22    Authorization Type Med A- KX modifier needed    Progress Note Due on Visit 30    PT Start Time 0930    PT Stop Time 1010    PT Time Calculation (min) 40 min    Activity Tolerance Patient tolerated treatment well    Behavior During Therapy WFL for tasks assessed/performed                Past Medical History:  Diagnosis Date   Allergic rhinitis    Allergy    Anxiety    Arthritis    Breast cancer ([place]) 04/28/2020   rigtht breast   Bronchitis    CAD (coronary artery disease)    1/19 PCI/DES to [facility] for ISR, normal EF.    Cancer ([place])    hx of precancerous cells in right breast    Cataract    Cervical dysplasia    unsure of procedure, possible "burning" in her late 47s   CHF (congestive heart failure) ([place])    Echo 06/2019: EF 55-60, elevated LVEDP, normal RV SF, mild MAC, mild MR, trivial TR   Complication of anesthesia    COPD (chronic obstructive pulmonary disease) (H[facility])    early    Depression    Dyspnea    with exertion    Family history of adverse reaction to anesthesia    daughter- problems wiht n/v   GERD (gastroesophageal reflux disease)    Gout    Headache(784.0)    Heart murmur    hx of years ago    Hyperlipidemia    Hypertension    Low back pain    Menopausal syndrome    Myocardial infarct ([place]) 2007   hx of   Overactive bladder    PONV (postoperative nausea and vomiting)    Sleep apnea    Past Surgical History:  Procedure Laterality Date   ANGIOPLASTY     stent 2007    BREAST LUMPECTOMY WITH RADIOACTIVE SEED LOCALIZATION Right 04/28/2020   Procedure: RIGHT BREAST LUMPECTOMY X 2  WITH RADIOACTIVE SEED LOCALIZATION;  Surgeon: Donnie Mesa, MD;  Location: [facility];  Service: General;  Laterality: Right;  LMA   CARDIAC CATHETERIZATION     COLONOSCOPY     CORONARY STENT INTERVENTION N/A 11/06/2017   Procedure: CORONARY STENT INTERVENTION;  Surgeon: [name], MD;  Location: [facility];  Service: Cardiovascular;  Laterality: N/A;   LEFT HEART CATH AND CORONARY ANGIOGRAPHY N/A 11/06/2017   Procedure: LEFT HEART CATH AND CORONARY ANGIOGRAPHY;  Surgeon: [name], MD;  Location: [facility];  Service: Cardiovascular;  Laterality: N/A;   LEFT HEART CATH AND CORONARY ANGIOGRAPHY N/A 07/24/2019   Procedure: LEFT HEART CATH AND CORONARY ANGIOGRAPHY;  Surgeon: Burnell Blanks, MD;  Location: [facility];  Service: Cardiovascular;  Laterality: N/A;   RIGHT/LEFT HEART CATH AND CORONARY ANGIOGRAPHY N/A 09/29/2020   Procedure: RIGHT/LEFT HEART CATH AND CORONARY ANGIOGRAPHY;  Surgeon: Belva Crome, MD;  Location: [facility];  Service: Cardiovascular;  Laterality: N/A;   ROBOTIC ASSISTED BILATERAL SALPINGO OOPHERECTOMY Bilateral 11/10/2020   Procedure: XI ROBOTIC ASSISTED BILATERAL SALPINGO OOPHORECTOMY WITH MINI LAPAROTOMY FOR DRAINAGE;  Surgeon: Lafonda Mosses, MD;  Location: [facility];  Service: Gynecology;  Laterality: Bilateral;  MINI LAP FIRST   TONSILLECTOMY     VULVECTOMY N/A 11/10/2020   Procedure: WIDE EXCISION VULVECTOMY;  Surgeon: Lafonda Mosses, MD;  Location: [facility];  Service: Gynecology;  Laterality: N/A;   Patient Active Problem List   Diagnosis Date Noted   Dizziness 01/19/2022   Frequent falls 01/19/2022   Tremor of both hands 08/16/2021   Aortic atherosclerosis ([place]) 06/15/2021   Generalized osteoarthritis of multiple sites 12/25/2020   Chronic pain disorder 12/25/2020   Pelvic mass  in female    Bilateral lower extremity edema 04/13/2020   Shortness of breath    Anxiety disorder 10/29/2018   CKD (chronic kidney disease), stage III ([place]) 06/27/2018   COPD (chronic obstructive pulmonary disease) ([place]) 05/25/2018   Obesity 03/20/2018   Dyspnea on exertion 11/06/2017   Abnormal stress test 11/06/2017   Chronic diastolic CHF (congestive heart failure) ([place]) 10/03/2017   Chronic combined systolic and diastolic heart failure ([place]) 09/04/2017   Left ventricular dysfunction 07/31/2017   Cataract, nuclear sclerotic senile, bilateral 02/01/2017   Gouty arthritis of toe of left foot 08/09/2016   Hyperuricemia 08/09/2016   Osteoarthritis (arthritis due to wear and tear of joints) 01/14/2014   Coronary artery disease 08/02/2011   Mitral regurgitation 08/02/2011   OVERACTIVE BLADDER 07/02/2010   ACUTE CYSTITIS 05/25/2010   Pure hypercholesterolemia 08/11/2009   PEDAL EDEMA 07/22/2009   GERD 04/11/2008   Essential hypertension 05/30/2007   MYOCARDIAL INFARCTION, HX OF 05/30/2007   Allergic rhinitis 05/30/2007   LOW BACK PAIN 05/30/2007    REFERRING DIAG: M25.511,G89.29 (ICD-10-CM) - Chronic right shoulder pain   THERAPY DIAG:  Chronic right shoulder pain  Muscle weakness (generalized)  Unsteadiness on feet  Difficulty in walking, not elsewhere classified  Dizziness and giddiness  Repeated falls  Rationale for Evaluation and Treatment Rehabilitation  PERTINENT HISTORY: Dizziness, CVA, Hx of falls, OA  PRECAUTIONS: Fall Risk  SUBJECTIVE: Pt reports that the iontopatch helps decrease pain to 1/10 during wear time, but the pain does go back up after taking the patch off.  PAIN:  Are you having pain? Yes: NPRS scale: 4/10 Pain location: right shoulder Pain description: burning Aggravating factors: use Relieving factors: rest  PATIENT GOALS:  Pt would like to be able to move around better without pain.  OBJECTIVE: (objective measures completed at initial  evaluation unless otherwise dated)  DIAGNOSTIC FINDINGS:  01/16/2022 CT of cervical spine:  1. No acute fracture or traumatic subluxation of the cervical spine.  2. Stable 3 mm right upper lobe nodule. No follow-up needed if patient is low-risk.This recommendation follows the consensus statement: Guidelines for Management of Incidental Pulmonary Nodules Detected on CT Images: From the Fleischner Society 2017; Radiology 2017; 284:228-243.   PATIENT SURVEYS:  03/09/2022: FOTO 51% (projected 58% by visit 11) 04/12/2022:  FOTO 59% 05/31/2022:  FOTO 70%   COGNITION:           Overall cognitive status: Within functional limits for tasks assessed                                  SENSATION: Light touch: WFL   POSTURE: Forward  head, rounded shoulders   UPPER EXTREMITY ROM:    Active ROM Right 03/09/22 Right 04/14/22 Right 04/28/22 Right 05/31/22 Left 03/09/22  Shoulder flexion 115 128 130 138 137  Shoulder extension         Shoulder abduction 116 125 127 138 125  Shoulder adduction         Shoulder internal rotation         Shoulder external rotation         Elbow flexion         Elbow extension         Wrist flexion         Wrist extension         Wrist ulnar deviation         Wrist radial deviation         Wrist pronation         Wrist supination         (Blank rows = not tested)   UPPER EXTREMITY MMT:   MMT Right 03/09/2022 Right 04/12/2022 Left 03/09/2022  Shoulder flexion 4 4+ 5  Shoulder extension       Shoulder abduction _0 Shoulder adduction       Shoulder internal rotation 4 4+ 5  Shoulder external rotation _1 Middle trapezius       Lower trapezius       Elbow flexion       Elbow extension       Wrist flexion       Wrist extension       Wrist ulnar deviation       Wrist radial deviation       Wrist pronation       Wrist supination       Grip strength (lbs)       (Blank rows = not tested)   SHOULDER SPECIAL TESTS:            Impingement tests:  Hawkins/Kennedy impingement test: positive right            Rotator cuff assessment: Empty can test: negative   JOINT MOBILITY TESTING:  Crepetis noted in right shoulder   PALPATION:  Tender to palpation along shoulder joint   FUNCTIONAL TESTING: 03/09/2022: 5 times sit to/from stand:  18.7 seconds with UE use TUG 19.3 sec with [facility]               03/29/22: 5x sit to stand: 23 seconds (pt reports dizziness today)    04/05/22: TUG 14.5 seconds   04/12/2022:   5 times sit to/from stand:  14.4 seconds with UE use TUG in 12.97 sec without assistive device  04/28/2022: 5 times sit to/from stand:  14.2 seconds with UE use  TODAY'S TREATMENT:   06/02/2022: Nustep level 5 x6 min with PT present to discuss status Seated with red theraband: shoulder ER, horizontal abduction.  BUE 2x10 Shoulder flexion and chest press with 2# dumbbells 2x12 each bilat  Standing rows and extension with green tband 2x10 Wall wash for flexion and abduction 2x10 RUE each Wall push ups 2x10 Wall wash for flexion and abduction 2x10 RUE each Doorway pec stretch 2x20 sec Standing on blue foam performing ball toss x20 throws Standing on blue foam performing head turns x1 min  05/31/2022: Nustep level 5 x6 min with PT present to discuss status FOTO 70% Seated with red theraband: shoulder ER, horizontal abduction.  BUE 2x10 Shoulder flexion and chest press with 2#  dumbbells 2x12 each bilat  Standing rows and extension with green tband 2x10 Wall wash for flexion and abduction 3T34 RUE each Application of iontopatch with Dexamethasone with instructions on use and to remove after 4 hours Ambulation down hallway with SPC with horizontal and vertical head turns x1 lap each Wall push ups 2x10  05/13/2022: Nustep level 5 x6 min with PT present to discuss status Seated with red theraband: shoulder ER, horizontal abduction.  BUE 2x10 Shoulder flexion and chest press with 2# dumbbells 2x12 each bilat  Right shoulder IR  towel stretch 2A76 sec Application of iontopatch with Dexamethasone with instructions on use and to remove after 4 hours. Wall wash for flexion and abduction 2x10 RUE each Standing rows and extension with green tband 2x10 Wall push ups 2x10     PATIENT EDUCATION: Education details: Issued HEP and provided with yellow tband Person educated: Patient Education method: Explanation, Demonstration, and Handouts Education comprehension: verbalized understanding and returned demonstration     HOME EXERCISE PROGRAM: Access Code: [email or web address] URL: [email or web address] Date: 06/02/2022 Prepared by: Shelby Dubin Jovannie Ulibarri  Exercises - Shoulder External Rotation and Scapular Retraction with Resistance  - 1-2 x daily - 7 x weekly - 2 sets - 10 reps - Standing Shoulder Horizontal Abduction with Resistance  - 1-2 x daily - 7 x weekly - 2 sets - 10 reps - Seated Scapular Retraction  - 1-2 x daily - 7 x weekly - 2 sets - 10 reps - Seated Upper Trapezius Stretch  - 1 x daily - 7 x weekly - 1 sets - 2 reps - 20 sec hold - Seated Levator Scapulae Stretch  - 1 x daily - 7 x weekly - 1 sets - 2 reps - 20 sec hold - Wall Push Up  - 1 x daily - 7 x weekly - 2 sets - 10 reps - Doorway Pec Stretch at 90 Degrees Abduction  - 1 x daily - 7 x weekly - 1 sets - 2 reps - 20 sec hold   ASSESSMENT:   CLINICAL IMPRESSION: Ms Critzer presents to skilled rehabilitation with reports of continued relief with use of iontopatch with dexamethasone.  Did not apply a patch today secondary to patient going to follow up with ortho today.  Updated HEP and provided pt with red tband for home use, as she had yellow from before.  Will follow up with patient next week to assess how her MD appointment went and new recommendations.     OBJECTIVE IMPAIRMENTS decreased balance, difficulty walking, decreased ROM, decreased strength, dizziness, increased muscle spasms, impaired UE functional use, postural dysfunction, and pain.     ACTIVITY LIMITATIONS carrying and lifting   PARTICIPATION LIMITATIONS: cleaning and driving   PERSONAL FACTORS Time since onset of injury/illness/exacerbation and 3+ comorbidities: COPD, CHF, HTN, OA  are also affecting patient's functional outcome.    REHAB POTENTIAL: Good   CLINICAL DECISION MAKING: Evolving/moderate complexity   EVALUATION COMPLEXITY: Moderate     GOALS: Goals reviewed with patient? Yes   SHORT TERM GOALS: Target date: 03/30/2022     Pt will be independent with initial HEP. Baseline: Goal status: Goal Met 03/25/2022   2.  Pt will report at least a 40% improvement in symptoms since starting PT. Baseline: 40-45%  (04/05/22) Goal status: MET     LONG TERM GOALS: Target date: 06/24/2022     Pt will be independent with advanced HEP. Baseline:  Goal status: Ongoing   2.  Pt will increase FOTO to 58%  to demonstrate her improvements in functional mobility. Baseline: 51% Goal status: Goal Met on 04/12/2022   3.  Pt will report being able to fasten/unfasten her bra without increased pain. Baseline: burning pain (04/05/22) Goal status: Goal Met on 05/31/2022   4.  Pt will decrease time on TUG to 14 seconds or less without assistive device and good safety to decrease risk of falling. Baseline: 14.5 seconds (04/05/22) Goal status: Goal Met 04/12/2022 (12.97 sec)   5.  Pt will increase right shoulder strength to at least 4+/5 to allow her to carry groceries without difficulty. Baseline:  Goal status: Ongoing (see above)   6.  Pt will state no dizziness with functional tasks, including reaching during grocery shopping for at least the week prior to discharge. Baseline: Pt reports occasionally having dizziness and losing balance (04/28/22) Goal status: Ongoing  7.  Pt will be able to put away dishes in her home with pain no greater than 2/10.  Baseline:  Pain 4/10  Goal status:  Ongoing     PLAN: PT FREQUENCY: 2x/week   PT DURATION: 8 weeks   PLANNED  INTERVENTIONS: Therapeutic exercises, Therapeutic activity, Neuromuscular re-education, Balance training, Gait training, Patient/Family education, Joint manipulation, Joint mobilization, Stair training, Vestibular training, Aquatic Therapy, Dry Needling, Electrical stimulation, Spinal manipulation, Spinal mobilization, Cryotherapy, Moist heat, Taping, Vasopneumatic device, Traction, Ultrasound, Ionotophoresis 52m/ml Dexamethasone, Manual therapy, and Re-evaluation   PLAN FOR NEXT SESSION:  Ask about MD appointment, Assess response to ionto patch and apply iontopatch #4 if indicated, Rt shoulder and scapular strength, balance as indicated     SJuel Burrow PT 06/02/22 10:14 AM   [address] SEdwardsvilleGUrie Jobos [address]Phone # [phone] [phone]

## 2022-06-07 ENCOUNTER — Encounter: Payer: Self-pay | Admitting: Rehabilitative and Restorative Service Providers"

## 2022-06-07 ENCOUNTER — Ambulatory Visit: Payer: Medicare Other | Admitting: Rehabilitative and Restorative Service Providers"

## 2022-06-07 DIAGNOSIS — R296 Repeated falls: Secondary | ICD-10-CM

## 2022-06-07 DIAGNOSIS — R2681 Unsteadiness on feet: Secondary | ICD-10-CM

## 2022-06-07 DIAGNOSIS — R262 Difficulty in walking, not elsewhere classified: Secondary | ICD-10-CM

## 2022-06-07 DIAGNOSIS — R42 Dizziness and giddiness: Secondary | ICD-10-CM

## 2022-06-07 DIAGNOSIS — M6281 Muscle weakness (generalized): Secondary | ICD-10-CM

## 2022-06-07 DIAGNOSIS — G8929 Other chronic pain: Secondary | ICD-10-CM

## 2022-06-07 DIAGNOSIS — M25511 Pain in right shoulder: Secondary | ICD-10-CM | POA: Diagnosis not present

## 2022-06-07 NOTE — Therapy (Signed)
OUTPATIENT PHYSICAL THERAPY TREATMENT NOTE   Patient Name: Tracy [name] MRN: [id number] DOB:[birth year], 76 y.o., female Today's Date: 06/07/2022  PCP: [name], [name], MD  REFERRING PROVIDER: [name], [name], MD   Progress Note Reporting Period 03/09/2022 to 05/31/2022  See note below for Objective Data and Assessment of Progress/Goals.      END OF SESSION:   PT End of Session - 06/07/22 0935     Visit Number 22    Date for PT Re-Evaluation 06/24/22    Authorization Type Med A- KX modifier needed    Progress Note Due on Visit 30    PT Start Time 0930    PT Stop Time 1010    PT Time Calculation (min) 40 min    Activity Tolerance Patient tolerated treatment well    Behavior During Therapy WFL for tasks assessed/performed                Past Medical History:  Diagnosis Date   Allergic rhinitis    Allergy    Anxiety    Arthritis    Breast cancer ([place]) 04/28/2020   rigtht breast   Bronchitis    CAD (coronary artery disease)    1/19 PCI/DES to [place] for ISR, normal EF.    Cancer ([place])    hx of precancerous cells in right breast    Cataract    Cervical dysplasia    unsure of procedure, possible "burning" in her late 28s   CHF (congestive heart failure) ([place])    Echo 06/2019: EF 55-60, elevated LVEDP, normal RV SF, mild MAC, mild MR, trivial TR   Complication of anesthesia    COPD (chronic obstructive pulmonary disease) (H[facility])    early    Depression    Dyspnea    with exertion    Family history of adverse reaction to anesthesia    daughter- problems wiht n/v   GERD (gastroesophageal reflux disease)    Gout    Headache(784.0)    Heart murmur    hx of years ago    Hyperlipidemia    Hypertension    Low back pain    Menopausal syndrome    Myocardial infarct ([place]) 2007   hx of   Overactive bladder    PONV (postoperative nausea and vomiting)    Sleep apnea    Past Surgical History:  Procedure Laterality Date   ANGIOPLASTY     stent 2007    BREAST LUMPECTOMY WITH RADIOACTIVE SEED LOCALIZATION Right 04/28/2020   Procedure: RIGHT BREAST LUMPECTOMY X 2  WITH RADIOACTIVE SEED LOCALIZATION;  Surgeon: Donnie Mesa, MD;  Location: [facility];  Service: General;  Laterality: Right;  LMA   CARDIAC CATHETERIZATION     COLONOSCOPY     CORONARY STENT INTERVENTION N/A 11/06/2017   Procedure: CORONARY STENT INTERVENTION;  Surgeon: [name], MD;  Location: [facility];  Service: Cardiovascular;  Laterality: N/A;   LEFT HEART CATH AND CORONARY ANGIOGRAPHY N/A 11/06/2017   Procedure: LEFT HEART CATH AND CORONARY ANGIOGRAPHY;  Surgeon: [name], MD;  Location: [facility];  Service: Cardiovascular;  Laterality: N/A;   LEFT HEART CATH AND CORONARY ANGIOGRAPHY N/A 07/24/2019   Procedure: LEFT HEART CATH AND CORONARY ANGIOGRAPHY;  Surgeon: Burnell Blanks, MD;  Location: [facility];  Service: Cardiovascular;  Laterality: N/A;   RIGHT/LEFT HEART CATH AND CORONARY ANGIOGRAPHY N/A 09/29/2020   Procedure: RIGHT/LEFT HEART CATH AND CORONARY ANGIOGRAPHY;  Surgeon: Belva Crome, MD;  Location: [facility];  Service: Cardiovascular;  Laterality: N/A;   ROBOTIC ASSISTED BILATERAL SALPINGO OOPHERECTOMY Bilateral 11/10/2020   Procedure: XI ROBOTIC ASSISTED BILATERAL SALPINGO OOPHORECTOMY WITH MINI LAPAROTOMY FOR DRAINAGE;  Surgeon: Lafonda Mosses, MD;  Location: [facility];  Service: Gynecology;  Laterality: Bilateral;  MINI LAP FIRST   TONSILLECTOMY     VULVECTOMY N/A 11/10/2020   Procedure: WIDE EXCISION VULVECTOMY;  Surgeon: Lafonda Mosses, MD;  Location: [facility];  Service: Gynecology;  Laterality: N/A;   Patient Active Problem List   Diagnosis Date Noted   Dizziness 01/19/2022   Frequent falls 01/19/2022   Tremor of both hands 08/16/2021   Aortic atherosclerosis ([place]) 06/15/2021   Generalized osteoarthritis of multiple sites 12/25/2020   Chronic pain disorder 12/25/2020   Pelvic mass  in female    Bilateral lower extremity edema 04/13/2020   Shortness of breath    Anxiety disorder 10/29/2018   CKD (chronic kidney disease), stage III ([place]) 06/27/2018   COPD (chronic obstructive pulmonary disease) ([place]) 05/25/2018   Obesity 03/20/2018   Dyspnea on exertion 11/06/2017   Abnormal stress test 11/06/2017   Chronic diastolic CHF (congestive heart failure) ([place]) 10/03/2017   Chronic combined systolic and diastolic heart failure ([place]) 09/04/2017   Left ventricular dysfunction 07/31/2017   Cataract, nuclear sclerotic senile, bilateral 02/01/2017   Gouty arthritis of toe of left foot 08/09/2016   Hyperuricemia 08/09/2016   Osteoarthritis (arthritis due to wear and tear of joints) 01/14/2014   Coronary artery disease 08/02/2011   Mitral regurgitation 08/02/2011   OVERACTIVE BLADDER 07/02/2010   ACUTE CYSTITIS 05/25/2010   Pure hypercholesterolemia 08/11/2009   PEDAL EDEMA 07/22/2009   GERD 04/11/2008   Essential hypertension 05/30/2007   MYOCARDIAL INFARCTION, HX OF 05/30/2007   Allergic rhinitis 05/30/2007   LOW BACK PAIN 05/30/2007    REFERRING DIAG: M25.511,G89.29 (ICD-10-CM) - Chronic right shoulder pain   THERAPY DIAG:  Chronic right shoulder pain  Muscle weakness (generalized)  Unsteadiness on feet  Difficulty in walking, not elsewhere classified  Dizziness and giddiness  Repeated falls  Rationale for Evaluation and Treatment Rehabilitation  PERTINENT HISTORY: Dizziness, CVA, Hx of falls, OA  PRECAUTIONS: Fall Risk  SUBJECTIVE: Pt reports that she had an injection from the ortho at her follow up appointment and she had increased pain since.  The pain has alleviated some from the increased pain that she was feeling over the weekend.  PAIN:  Are you having pain? Yes: NPRS scale: 5/10 Pain location: right shoulder Pain description: burning Aggravating factors: use Relieving factors: rest  PATIENT GOALS:  Pt would like to be able to move around  better without pain.  OBJECTIVE: (objective measures completed at initial evaluation unless otherwise dated)  DIAGNOSTIC FINDINGS:  01/16/2022 CT of cervical spine:  1. No acute fracture or traumatic subluxation of the cervical spine.  2. Stable 3 mm right upper lobe nodule. No follow-up needed if patient is low-risk.This recommendation follows the consensus statement: Guidelines for Management of Incidental Pulmonary Nodules Detected on CT Images: From the Fleischner Society 2017; Radiology 2017; 284:228-243.   PATIENT SURVEYS:  03/09/2022: FOTO 51% (projected 58% by visit 11) 04/12/2022:  FOTO 59% 05/31/2022:  FOTO 70%   COGNITION:           Overall cognitive status: Within functional limits for tasks assessed  SENSATION: Light touch: WFL   POSTURE: Forward head, rounded shoulders   UPPER EXTREMITY ROM:    Active ROM Right 03/09/22 Right 04/14/22 Right 04/28/22 Right 05/31/22 Left 03/09/22  Shoulder flexion 115 128 130 138 137  Shoulder extension         Shoulder abduction 116 125 127 138 125  Shoulder adduction         Shoulder internal rotation         Shoulder external rotation         Elbow flexion         Elbow extension         Wrist flexion         Wrist extension         Wrist ulnar deviation         Wrist radial deviation         Wrist pronation         Wrist supination         (Blank rows = not tested)   UPPER EXTREMITY MMT:   MMT Right 03/09/2022 Right 04/12/2022 Left 03/09/2022  Shoulder flexion 4 4+ 5  Shoulder extension       Shoulder abduction _0 Shoulder adduction       Shoulder internal rotation 4 4+ 5  Shoulder external rotation _1 Middle trapezius       Lower trapezius       Elbow flexion       Elbow extension       Wrist flexion       Wrist extension       Wrist ulnar deviation       Wrist radial deviation       Wrist pronation       Wrist supination       Grip strength (lbs)       (Blank rows =  not tested)   SHOULDER SPECIAL TESTS:            Impingement tests: Hawkins/Kennedy impingement test: positive right            Rotator cuff assessment: Empty can test: negative   JOINT MOBILITY TESTING:  Crepetis noted in right shoulder   PALPATION:  Tender to palpation along shoulder joint   FUNCTIONAL TESTING: 03/09/2022: 5 times sit to/from stand:  18.7 seconds with UE use TUG 19.3 sec with [facility]               03/29/22: 5x sit to stand: 23 seconds (pt reports dizziness today)    04/05/22: TUG 14.5 seconds   04/12/2022:   5 times sit to/from stand:  14.4 seconds with UE use TUG in 12.97 sec without assistive device  04/28/2022: 5 times sit to/from stand:  14.2 seconds with UE use  TODAY'S TREATMENT:   06/07/2022: Nustep level 5 x6 min with PT present to discuss status Seated with red theraband: shoulder ER, horizontal abduction.  BUE 2x10 Shoulder flexion, bicep curls, and chest press with 2# dumbbells 2x12 each bilat  Standing rows and extension with green tband 2x10 Wall wash for flexion and abduction 2x10 RUE each Wall push ups 2x10 Ambulation down hallway with SPC with horizontal and vertical head turns x2 laps each  06/02/2022: Nustep level 5 x6 min with PT present to discuss status Seated with red theraband: shoulder ER, horizontal abduction.  BUE 2x10 Shoulder flexion and chest press with 2# dumbbells 2x12 each bilat  Standing rows and extension with  green tband 2x10 Wall wash for flexion and abduction 2x10 RUE each Wall push ups 2x10 Doorway pec stretch 2x20 sec Standing on blue foam performing ball toss x20 throws Standing on blue foam performing head turns x1 min  05/31/2022: Nustep level 5 x6 min with PT present to discuss status FOTO 70% Seated with red theraband: shoulder ER, horizontal abduction.  BUE 2x10 Shoulder flexion and chest press with 2# dumbbells 2x12 each bilat  Standing rows and extension with green tband 2x10 Wall wash for flexion and  abduction 2I94 RUE each Application of iontopatch with Dexamethasone with instructions on use and to remove after 4 hours Ambulation down hallway with SPC with horizontal and vertical head turns x1 lap each Wall push ups 2x10     PATIENT EDUCATION: Education details: Issued HEP and provided with yellow tband Person educated: Patient Education method: Explanation, Demonstration, and Handouts Education comprehension: verbalized understanding and returned demonstration     HOME EXERCISE PROGRAM: Access Code: [email or web address] URL: [email or web address] Date: 06/02/2022 Prepared by: Shelby Dubin Iliany Losier  Exercises - Shoulder External Rotation and Scapular Retraction with Resistance  - 1-2 x daily - 7 x weekly - 2 sets - 10 reps - Standing Shoulder Horizontal Abduction with Resistance  - 1-2 x daily - 7 x weekly - 2 sets - 10 reps - Seated Scapular Retraction  - 1-2 x daily - 7 x weekly - 2 sets - 10 reps - Seated Upper Trapezius Stretch  - 1 x daily - 7 x weekly - 1 sets - 2 reps - 20 sec hold - Seated Levator Scapulae Stretch  - 1 x daily - 7 x weekly - 1 sets - 2 reps - 20 sec hold - Wall Push Up  - 1 x daily - 7 x weekly - 2 sets - 10 reps - Doorway Pec Stretch at 90 Degrees Abduction  - 1 x daily - 7 x weekly - 1 sets - 2 reps - 20 sec hold   ASSESSMENT:   CLINICAL IMPRESSION: Ms Liggett presents to skilled rehabilitation with reports of some increased pain following her injection. Pt able to progress through exercises with minimal complaints of soreness.  Pt states pain with palpation over AC joint during session today, which is where she received her injection.  Pt continues to require skilled PT to progress through goal related activities.     OBJECTIVE IMPAIRMENTS decreased balance, difficulty walking, decreased ROM, decreased strength, dizziness, increased muscle spasms, impaired UE functional use, postural dysfunction, and pain.    ACTIVITY LIMITATIONS carrying and lifting    PARTICIPATION LIMITATIONS: cleaning and driving   PERSONAL FACTORS Time since onset of injury/illness/exacerbation and 3+ comorbidities: COPD, CHF, HTN, OA  are also affecting patient's functional outcome.    REHAB POTENTIAL: Good   CLINICAL DECISION MAKING: Evolving/moderate complexity   EVALUATION COMPLEXITY: Moderate     GOALS: Goals reviewed with patient? Yes   SHORT TERM GOALS: Target date: 03/30/2022     Pt will be independent with initial HEP. Baseline: Goal status: Goal Met 03/25/2022   2.  Pt will report at least a 40% improvement in symptoms since starting PT. Baseline: 40-45%  (04/05/22) Goal status: MET     LONG TERM GOALS: Target date: 06/24/2022     Pt will be independent with advanced HEP. Baseline:  Goal status: Ongoing   2.  Pt will increase FOTO to 58% to demonstrate her improvements in functional mobility. Baseline: 51% Goal status: Goal Met on 04/12/2022   3.  Pt will report being able to fasten/unfasten her bra without increased pain. Baseline: burning pain (04/05/22) Goal status: Goal Met on 05/31/2022   4.  Pt will decrease time on TUG to 14 seconds or less without assistive device and good safety to decrease risk of falling. Baseline: 14.5 seconds (04/05/22) Goal status: Goal Met 04/12/2022 (12.97 sec)   5.  Pt will increase right shoulder strength to at least 4+/5 to allow her to carry groceries without difficulty. Baseline:  Goal status: Ongoing (see above)   6.  Pt will state no dizziness with functional tasks, including reaching during grocery shopping for at least the week prior to discharge. Baseline: Pt reports occasionally having dizziness and losing balance (04/28/22) Goal status: Ongoing  7.  Pt will be able to put away dishes in her home with pain no greater than 2/10.  Baseline:  Pain 4/10  Goal status:  Ongoing     PLAN: PT FREQUENCY: 2x/week   PT DURATION: 8 weeks   PLANNED INTERVENTIONS: Therapeutic exercises, Therapeutic  activity, Neuromuscular re-education, Balance training, Gait training, Patient/Family education, Joint manipulation, Joint mobilization, Stair training, Vestibular training, Aquatic Therapy, Dry Needling, Electrical stimulation, Spinal manipulation, Spinal mobilization, Cryotherapy, Moist heat, Taping, Vasopneumatic device, Traction, Ultrasound, Ionotophoresis 63m/ml Dexamethasone, Manual therapy, and Re-evaluation   PLAN FOR NEXT SESSION:  Ask about MD appointment, Assess response to ionto patch and apply iontopatch #4 if indicated, Rt shoulder and scapular strength, balance as indicated     SJuel Burrow PT 06/07/22 10:24 AM   [id number] SB[place]rand[place] [id number] # [phone] [phone]

## 2022-06-09 ENCOUNTER — Encounter: Payer: Self-pay | Admitting: Rehabilitative and Restorative Service Providers"

## 2022-06-09 ENCOUNTER — Ambulatory Visit: Payer: Medicare Other | Admitting: Rehabilitative and Restorative Service Providers"

## 2022-06-09 DIAGNOSIS — M25511 Pain in right shoulder: Secondary | ICD-10-CM | POA: Diagnosis not present

## 2022-06-09 DIAGNOSIS — R296 Repeated falls: Secondary | ICD-10-CM

## 2022-06-09 DIAGNOSIS — M25561 Pain in right knee: Secondary | ICD-10-CM | POA: Diagnosis not present

## 2022-06-09 DIAGNOSIS — R42 Dizziness and giddiness: Secondary | ICD-10-CM | POA: Diagnosis not present

## 2022-06-09 DIAGNOSIS — M25562 Pain in left knee: Secondary | ICD-10-CM | POA: Diagnosis not present

## 2022-06-09 DIAGNOSIS — R2681 Unsteadiness on feet: Secondary | ICD-10-CM | POA: Diagnosis not present

## 2022-06-09 DIAGNOSIS — M170 Bilateral primary osteoarthritis of knee: Secondary | ICD-10-CM | POA: Diagnosis not present

## 2022-06-09 DIAGNOSIS — M6281 Muscle weakness (generalized): Secondary | ICD-10-CM | POA: Diagnosis not present

## 2022-06-09 DIAGNOSIS — R262 Difficulty in walking, not elsewhere classified: Secondary | ICD-10-CM

## 2022-06-09 DIAGNOSIS — G8929 Other chronic pain: Secondary | ICD-10-CM

## 2022-06-09 NOTE — Therapy (Signed)
OUTPATIENT PHYSICAL THERAPY TREATMENT NOTE   Patient Name: Tracy [name] MRN: [id number] DOB:Jan 31, 1946, 76 y.o., female Today's Date: 06/09/2022  PCP: [name], [name], MD  REFERRING PROVIDER: [name], [name], MD    END OF SESSION:   PT End of Session - 06/09/22 0920     Visit Number 23    Date for PT Re-Evaluation 06/24/22    Authorization Type Med A- KX modifier needed    Progress Note Due on Visit 49    PT Start Time 0917    PT Stop Time 0955    PT Time Calculation (min) 38 min    Activity Tolerance Patient tolerated treatment well    Behavior During Therapy [facility] for tasks assessed/performed                Past Medical History:  Diagnosis Date   Allergic rhinitis    Allergy    Anxiety    Arthritis    Breast cancer ([place]) 04/28/2020   rigtht breast   Bronchitis    CAD (coronary artery disease)    1/19 PCI/DES to [place] for ISR, normal EF.    Cancer ([place])    hx of precancerous cells in right breast    Cataract    Cervical dysplasia    unsure of procedure, possible "burning" in her late 80s   CHF (congestive heart failure) ([place])    Echo 06/2019: EF 55-60, elevated LVEDP, normal RV SF, mild MAC, mild MR, trivial TR   Complication of anesthesia    COPD (chronic obstructive pulmonary disease) (HCC)    early    Depression    Dyspnea    with exertion    Family history of adverse reaction to anesthesia    daughter- problems wiht n/v   GERD (gastroesophageal reflux disease)    Gout    Headache(784.0)    Heart murmur    hx of years ago    Hyperlipidemia    Hypertension    Low back pain    Menopausal syndrome    Myocardial infarct ([place]) 2007   hx of   Overactive bladder    PONV (postoperative nausea and vomiting)    Sleep apnea    Past Surgical History:  Procedure Laterality Date   ANGIOPLASTY     stent 2007   BREAST LUMPECTOMY WITH RADIOACTIVE SEED LOCALIZATION Right 04/28/2020   Procedure: RIGHT BREAST LUMPECTOMY X 2  WITH RADIOACTIVE SEED  LOCALIZATION;  Surgeon: Donnie Mesa, MD;  Location: [place];  Service: General;  Laterality: Right;  LMA   CARDIAC CATHETERIZATION     COLONOSCOPY     CORONARY STENT INTERVENTION N/A 11/06/2017   Procedure: CORONARY STENT INTERVENTION;  Surgeon: [name], MD;  Location: [place] [facility];  Service: Cardiovascular;  Laterality: N/A;   LEFT HEART CATH AND CORONARY ANGIOGRAPHY N/A 11/06/2017   Procedure: LEFT HEART CATH AND CORONARY ANGIOGRAPHY;  Surgeon: [name], MD;  Location: [place] [facility];  Service: Cardiovascular;  Laterality: N/A;   LEFT HEART CATH AND CORONARY ANGIOGRAPHY N/A 07/24/2019   Procedure: LEFT HEART CATH AND CORONARY ANGIOGRAPHY;  Surgeon: Burnell Blanks, MD;  Location: [place] [facility];  Service: Cardiovascular;  Laterality: N/A;   RIGHT/LEFT HEART CATH AND CORONARY ANGIOGRAPHY N/A 09/29/2020   Procedure: RIGHT/LEFT HEART CATH AND CORONARY ANGIOGRAPHY;  Surgeon: Belva Crome, MD;  Location: [place] [facility];  Service: Cardiovascular;  Laterality: N/A;   ROBOTIC ASSISTED BILATERAL SALPINGO OOPHERECTOMY Bilateral 11/10/2020  Procedure: XI ROBOTIC ASSISTED BILATERAL SALPINGO OOPHORECTOMY WITH MINI LAPAROTOMY FOR DRAINAGE;  Surgeon: Lafonda Mosses, MD;  Location: [facility];  Service: Gynecology;  Laterality: Bilateral;  MINI LAP FIRST   TONSILLECTOMY     VULVECTOMY N/A 11/10/2020   Procedure: WIDE EXCISION VULVECTOMY;  Surgeon: Lafonda Mosses, MD;  Location: [facility];  Service: Gynecology;  Laterality: N/A;   Patient Active Problem List   Diagnosis Date Noted   Dizziness 01/19/2022   Frequent falls 01/19/2022   Tremor of both hands 08/16/2021   Aortic atherosclerosis ([place]) 06/15/2021   Generalized osteoarthritis of multiple sites 12/25/2020   Chronic pain disorder 12/25/2020   Pelvic mass in female    Bilateral lower extremity edema 04/13/2020   Shortness of breath    Anxiety disorder 10/29/2018   CKD (chronic kidney  disease), stage III ([place]) 06/27/2018   COPD (chronic obstructive pulmonary disease) ([place]) 05/25/2018   Obesity 03/20/2018   Dyspnea on exertion 11/06/2017   Abnormal stress test 11/06/2017   Chronic diastolic CHF (congestive heart failure) ([place]) 10/03/2017   Chronic combined systolic and diastolic heart failure ([place]) 09/04/2017   Left ventricular dysfunction 07/31/2017   Cataract, nuclear sclerotic senile, bilateral 02/01/2017   Gouty arthritis of toe of left foot 08/09/2016   Hyperuricemia 08/09/2016   Osteoarthritis (arthritis due to wear and tear of joints) 01/14/2014   Coronary artery disease 08/02/2011   Mitral regurgitation 08/02/2011   OVERACTIVE BLADDER 07/02/2010   ACUTE CYSTITIS 05/25/2010   Pure hypercholesterolemia 08/11/2009   PEDAL EDEMA 07/22/2009   GERD 04/11/2008   Essential hypertension 05/30/2007   MYOCARDIAL INFARCTION, HX OF 05/30/2007   Allergic rhinitis 05/30/2007   LOW BACK PAIN 05/30/2007    REFERRING DIAG: M25.511,G89.29 (ICD-10-CM) - Chronic right shoulder pain   THERAPY DIAG:  Chronic right shoulder pain  Muscle weakness (generalized)  Unsteadiness on feet  Difficulty in walking, not elsewhere classified  Dizziness and giddiness  Repeated falls  Rationale for Evaluation and Treatment Rehabilitation  PERTINENT HISTORY: Dizziness, CVA, Hx of falls, OA  PRECAUTIONS: Fall Risk  SUBJECTIVE: Pt reports that she is having some right knee pain today (3/10) with continued increased shoulder pain.  States that she has not noted any change in pain since injection.  PAIN:  Are you having pain? Yes: NPRS scale: 5/10 Pain location: right shoulder Pain description: burning Aggravating factors: use Relieving factors: rest  PATIENT GOALS:  Pt would like to be able to move around better without pain.  OBJECTIVE: (objective measures completed at initial evaluation unless otherwise dated)  DIAGNOSTIC FINDINGS:  01/16/2022 CT of cervical spine:  1. No  acute fracture or traumatic subluxation of the cervical spine.  2. Stable 3 mm right upper lobe nodule. No follow-up needed if patient is low-risk.This recommendation follows the consensus statement: Guidelines for Management of Incidental Pulmonary Nodules Detected on CT Images: From the Fleischner Society 2017; Radiology 2017; 284:228-243.   PATIENT SURVEYS:  03/09/2022: FOTO 51% (projected 58% by visit 11) 04/12/2022:  FOTO 59% 05/31/2022:  FOTO 70%   COGNITION:           Overall cognitive status: Within functional limits for tasks assessed                                  SENSATION: Light touch: WFL   POSTURE: Forward head, rounded shoulders   UPPER EXTREMITY ROM:    Active ROM Right 03/09/22 Right 04/14/22  Right 04/28/22 Right 05/31/22 Left 03/09/22  Shoulder flexion 115 128 130 138 137  Shoulder extension         Shoulder abduction 116 125 127 138 125  Shoulder adduction         Shoulder internal rotation         Shoulder external rotation         Elbow flexion         Elbow extension         Wrist flexion         Wrist extension         Wrist ulnar deviation         Wrist radial deviation         Wrist pronation         Wrist supination         (Blank rows = not tested)   UPPER EXTREMITY MMT:   MMT Right 03/09/2022 Right 04/12/2022 Left 03/09/2022  Shoulder flexion 4 4+ 5  Shoulder extension       Shoulder abduction _0 Shoulder adduction       Shoulder internal rotation 4 4+ 5  Shoulder external rotation _1 Middle trapezius       Lower trapezius       Elbow flexion       Elbow extension       Wrist flexion       Wrist extension       Wrist ulnar deviation       Wrist radial deviation       Wrist pronation       Wrist supination       Grip strength (lbs)       (Blank rows = not tested)   SHOULDER SPECIAL TESTS:            Impingement tests: Hawkins/Kennedy impingement test: positive right            Rotator cuff assessment: Empty can test:  negative   JOINT MOBILITY TESTING:  Crepetis noted in right shoulder   PALPATION:  Tender to palpation along shoulder joint   FUNCTIONAL TESTING: 03/09/2022: 5 times sit to/from stand:  18.7 seconds with UE use TUG 19.3 sec with [facility]               03/29/22: 5x sit to stand: 23 seconds (pt reports dizziness today)    04/05/22: TUG 14.5 seconds   04/12/2022:   5 times sit to/from stand:  14.4 seconds with UE use TUG in 12.97 sec without assistive device  04/28/2022: 5 times sit to/from stand:  14.2 seconds with UE use  TODAY'S TREATMENT:   06/09/2022: Nustep level 5 x6 min with PT present to discuss status Seated with red theraband: shoulder ER, horizontal abduction.  BUE 2x10 Shoulder flexion, bicep curls, shoulder abduction and chest press with 2# dumbbells 2x12 each bilat Standing rows and extension with green tband 2x12 Wall wash for flexion and abduction 2x10 RUE each Wall push ups 2x10 Shoulder flexion with serratus activation with green loop 2x10  06/07/2022: Nustep level 5 x6 min with PT present to discuss status Seated with red theraband: shoulder ER, horizontal abduction.  BUE 2x10 Shoulder flexion, bicep curls, and chest press with 2# dumbbells 2x12 each bilat  Standing rows and extension with green tband 2x10 Wall wash for flexion and abduction 2x10 RUE each Wall push ups 2x10 Ambulation down hallway with SPC with horizontal and vertical head turns  x2 laps each  06/02/2022: Nustep level 5 x6 min with PT present to discuss status Seated with red theraband: shoulder ER, horizontal abduction.  BUE 2x10 Shoulder flexion and chest press with 2# dumbbells 2x12 each bilat  Standing rows and extension with green tband 2x10 Wall wash for flexion and abduction 2x10 RUE each Wall push ups 2x10 Doorway pec stretch 2x20 sec Standing on blue foam performing ball toss x20 throws Standing on blue foam performing head turns x1 min     PATIENT EDUCATION: Education details:  Issued HEP and provided with yellow tband Person educated: Patient Education method: Explanation, Demonstration, and Handouts Education comprehension: verbalized understanding and returned demonstration     HOME EXERCISE PROGRAM: Access Code: [email or web address] URL: [email or web address] Date: 06/02/2022 Prepared by: Shelby Dubin Cheynne Virden  Exercises - Shoulder External Rotation and Scapular Retraction with Resistance  - 1-2 x daily - 7 x weekly - 2 sets - 10 reps - Standing Shoulder Horizontal Abduction with Resistance  - 1-2 x daily - 7 x weekly - 2 sets - 10 reps - Seated Scapular Retraction  - 1-2 x daily - 7 x weekly - 2 sets - 10 reps - Seated Upper Trapezius Stretch  - 1 x daily - 7 x weekly - 1 sets - 2 reps - 20 sec hold - Seated Levator Scapulae Stretch  - 1 x daily - 7 x weekly - 1 sets - 2 reps - 20 sec hold - Wall Push Up  - 1 x daily - 7 x weekly - 2 sets - 10 reps - Doorway Pec Stretch at 90 Degrees Abduction  - 1 x daily - 7 x weekly - 1 sets - 2 reps - 20 sec hold   ASSESSMENT:   CLINICAL IMPRESSION: Ms Niese presents to skilled rehabilitation with continued reports of right shoulder pain, states that she has not noted any decreased pain from injection at this time.  Pt able to progress with strengthening exercises and added in serratus strengthening today.  Pt continues to report that she can tell that she has gotten stronger since starting skilled PT.  Pt continues to require skilled PT to progress with goal related activities.     OBJECTIVE IMPAIRMENTS decreased balance, difficulty walking, decreased ROM, decreased strength, dizziness, increased muscle spasms, impaired UE functional use, postural dysfunction, and pain.    ACTIVITY LIMITATIONS carrying and lifting   PARTICIPATION LIMITATIONS: cleaning and driving   PERSONAL FACTORS Time since onset of injury/illness/exacerbation and 3+ comorbidities: COPD, CHF, HTN, OA  are also affecting patient's functional outcome.     REHAB POTENTIAL: Good   CLINICAL DECISION MAKING: Evolving/moderate complexity   EVALUATION COMPLEXITY: Moderate     GOALS: Goals reviewed with patient? Yes   SHORT TERM GOALS: Target date: 03/30/2022     Pt will be independent with initial HEP. Baseline: Goal status: Goal Met 03/25/2022   2.  Pt will report at least a 40% improvement in symptoms since starting PT. Baseline: 40-45%  (04/05/22) Goal status: MET     LONG TERM GOALS: Target date: 06/24/2022     Pt will be independent with advanced HEP. Baseline:  Goal status: Ongoing   2.  Pt will increase FOTO to 58% to demonstrate her improvements in functional mobility. Baseline: 51% Goal status: Goal Met on 04/12/2022   3.  Pt will report being able to fasten/unfasten her bra without increased pain. Baseline: burning pain (04/05/22) Goal status: Goal Met on 05/31/2022   4.  Pt will decrease time  on TUG to 14 seconds or less without assistive device and good safety to decrease risk of falling. Baseline: 14.5 seconds (04/05/22) Goal status: Goal Met 04/12/2022 (12.97 sec)   5.  Pt will increase right shoulder strength to at least 4+/5 to allow her to carry groceries without difficulty. Baseline:  Goal status: Ongoing (see above)   6.  Pt will state no dizziness with functional tasks, including reaching during grocery shopping for at least the week prior to discharge. Baseline: Pt reports occasionally having dizziness and losing balance (04/28/22) Goal status: Ongoing  7.  Pt will be able to put away dishes in her home with pain no greater than 2/10.  Baseline:  Pain 4/10  Goal status:  Ongoing     PLAN: PT FREQUENCY: 2x/week   PT DURATION: 8 weeks   PLANNED INTERVENTIONS: Therapeutic exercises, Therapeutic activity, Neuromuscular re-education, Balance training, Gait training, Patient/Family education, Joint manipulation, Joint mobilization, Stair training, Vestibular training, Aquatic Therapy, Dry Needling, Electrical  stimulation, Spinal manipulation, Spinal mobilization, Cryotherapy, Moist heat, Taping, Vasopneumatic device, Traction, Ultrasound, Ionotophoresis 55m/ml Dexamethasone, Manual therapy, and Re-evaluation   PLAN FOR NEXT SESSION:  Ask about MD appointment, Assess response to ionto patch and apply iontopatch #4 if indicated, Rt shoulder and scapular strength, balance as indicated     SJuel Burrow PT 06/09/22 10:08 AM   [place] [place]ust[place] [id number] # [phone] [phone]

## 2022-06-11 ENCOUNTER — Other Ambulatory Visit: Payer: Self-pay | Admitting: Cardiovascular Disease

## 2022-06-12 ENCOUNTER — Emergency Department (HOSPITAL_COMMUNITY)
Admission: EM | Admit: 2022-06-12 | Discharge: 2022-06-13 | Discharge status: Against Medical Advice | Payer: Medicare Other | Attending: Emergency Medicine | Admitting: Emergency Medicine

## 2022-06-12 ENCOUNTER — Other Ambulatory Visit: Payer: Self-pay

## 2022-06-12 ENCOUNTER — Emergency Department (HOSPITAL_COMMUNITY): Payer: Medicare Other

## 2022-06-12 DIAGNOSIS — Z5321 Procedure and treatment not carried out due to patient leaving prior to being seen by health care provider: Secondary | ICD-10-CM | POA: Diagnosis not present

## 2022-06-12 DIAGNOSIS — J9811 Atelectasis: Secondary | ICD-10-CM | POA: Diagnosis not present

## 2022-06-12 DIAGNOSIS — Z955 Presence of coronary angioplasty implant and graft: Secondary | ICD-10-CM | POA: Diagnosis not present

## 2022-06-12 DIAGNOSIS — R110 Nausea: Secondary | ICD-10-CM | POA: Diagnosis not present

## 2022-06-12 DIAGNOSIS — I2510 Atherosclerotic heart disease of native coronary artery without angina pectoris: Secondary | ICD-10-CM | POA: Insufficient documentation

## 2022-06-12 DIAGNOSIS — Z7982 Long term (current) use of aspirin: Secondary | ICD-10-CM | POA: Insufficient documentation

## 2022-06-12 DIAGNOSIS — I10 Essential (primary) hypertension: Secondary | ICD-10-CM | POA: Insufficient documentation

## 2022-06-12 DIAGNOSIS — J449 Chronic obstructive pulmonary disease, unspecified: Secondary | ICD-10-CM | POA: Diagnosis not present

## 2022-06-12 DIAGNOSIS — R0789 Other chest pain: Secondary | ICD-10-CM | POA: Diagnosis not present

## 2022-06-12 DIAGNOSIS — R0602 Shortness of breath: Secondary | ICD-10-CM | POA: Diagnosis not present

## 2022-06-12 DIAGNOSIS — R42 Dizziness and giddiness: Secondary | ICD-10-CM | POA: Insufficient documentation

## 2022-06-12 DIAGNOSIS — R079 Chest pain, unspecified: Secondary | ICD-10-CM | POA: Diagnosis not present

## 2022-06-12 LAB — BASIC METABOLIC PANEL
Anion gap: 8 (ref 5–15)
BUN: 51 mg/dL — ABNORMAL HIGH (ref 8–23)
CO2: 23 mmol/L (ref 22–32)
Calcium: 8.9 mg/dL (ref 8.9–10.3)
Chloride: 110 mmol/L (ref 98–111)
Creatinine, Ser: 1.63 mg/dL — ABNORMAL HIGH (ref 0.44–1.00)
GFR, Estimated: 33 mL/min — ABNORMAL LOW (ref >60)
Glucose, Bld: 94 mg/dL (ref 70–99)
Potassium: 4 mmol/L (ref 3.5–5.1)
Sodium: 141 mmol/L (ref 135–145)

## 2022-06-12 LAB — CBC WITH DIFFERENTIAL/PLATELET
Abs Immature Granulocytes: 0.07 10*3/uL (ref 0.00–0.07)
Basophils Absolute: 0.1 10*3/uL (ref 0.0–0.1)
Basophils Relative: 0 %
Eosinophils Absolute: 0.1 10*3/uL (ref 0.0–0.5)
Eosinophils Relative: 1 %
HCT: 43.8 % (ref 36.0–46.0)
Hemoglobin: 13.5 g/dL (ref 12.0–15.0)
Immature Granulocytes: 1 %
Lymphocytes Relative: 36 %
Lymphs Abs: 4.6 10*3/uL — ABNORMAL HIGH (ref 0.7–4.0)
MCH: 27.6 pg (ref 26.0–34.0)
MCHC: 30.8 g/dL (ref 30.0–36.0)
MCV: 89.6 fL (ref 80.0–100.0)
Monocytes Absolute: 1.5 10*3/uL — ABNORMAL HIGH (ref 0.1–1.0)
Monocytes Relative: 12 %
Neutro Abs: 6.7 10*3/uL (ref 1.7–7.7)
Neutrophils Relative %: 50 %
Platelets: 310 10*3/uL (ref 150–400)
RBC: 4.89 MIL/uL (ref 3.87–5.11)
RDW: 15.9 % — ABNORMAL HIGH (ref 11.5–15.5)
WBC: 13 10*3/uL — ABNORMAL HIGH (ref 4.0–10.5)
nRBC: 0 % (ref 0.0–0.2)

## 2022-06-12 LAB — TROPONIN I (HIGH SENSITIVITY): Troponin I (High Sensitivity): 7 ng/L (ref <18)

## 2022-06-12 NOTE — ED Triage Notes (Signed)
Pt here from home for L side  chest pain that radiates to L shoulder and back. PT states pain started last night and has been coming in waves but got worse today. Pt reports taking SL nitroglycerin "like candy", w/ minimal relief for a few minutes. Pt endorses sob, dizziness and nausea. Hx MI w/ stents placed.

## 2022-06-12 NOTE — ED Provider Triage Note (Signed)
Emergency Medicine Provider Triage Evaluation Note  Tracy [name] , a 76 y.o. female  was evaluated in triage.  Pt complains of chest pain that began last night. Describes it as a stabbing left sided chest pain that radiates around her ribs. Has associated shortness of breath. Denies palpitations. Denies cough or fever.   Review of Systems  Positive: See above Negative:  Physical Exam  BP 133/79 (BP Location: Right Arm)   Pulse 75   Temp 97.8 F (36.6 C) (Oral)   Resp 18   SpO2 96%  Gen:   Awake, no distress   Resp:  Normal effort  MSK:   Moves extremities without difficulty  Other:    Medical Decision Making  Medically screening exam initiated at 5:36 PM.  Appropriate orders placed.  Tracy [name] was informed that the remainder of the evaluation will be completed by another provider, this initial triage assessment does not replace that evaluation, and the importance of remaining in the ED until their evaluation is complete.     Mickie Hillier, PA-C 06/12/22 1737

## 2022-06-13 ENCOUNTER — Encounter (HOSPITAL_BASED_OUTPATIENT_CLINIC_OR_DEPARTMENT_OTHER): Payer: Self-pay

## 2022-06-13 ENCOUNTER — Emergency Department (HOSPITAL_BASED_OUTPATIENT_CLINIC_OR_DEPARTMENT_OTHER)
Admission: EM | Admit: 2022-06-13 | Discharge: 2022-06-13 | Disposition: A | Discharge status: Home / Self Care | Payer: Medicare Other | Source: Home / Self Care | Attending: Emergency Medicine | Admitting: Emergency Medicine

## 2022-06-13 ENCOUNTER — Other Ambulatory Visit: Payer: Self-pay

## 2022-06-13 ENCOUNTER — Emergency Department (HOSPITAL_BASED_OUTPATIENT_CLINIC_OR_DEPARTMENT_OTHER): Payer: Medicare Other | Admitting: Radiology

## 2022-06-13 ENCOUNTER — Emergency Department (HOSPITAL_BASED_OUTPATIENT_CLINIC_OR_DEPARTMENT_OTHER): Payer: Medicare Other

## 2022-06-13 DIAGNOSIS — R0789 Other chest pain: Secondary | ICD-10-CM | POA: Diagnosis not present

## 2022-06-13 DIAGNOSIS — R0602 Shortness of breath: Secondary | ICD-10-CM | POA: Diagnosis not present

## 2022-06-13 DIAGNOSIS — M47814 Spondylosis without myelopathy or radiculopathy, thoracic region: Secondary | ICD-10-CM | POA: Diagnosis not present

## 2022-06-13 DIAGNOSIS — I2510 Atherosclerotic heart disease of native coronary artery without angina pectoris: Secondary | ICD-10-CM | POA: Insufficient documentation

## 2022-06-13 DIAGNOSIS — I10 Essential (primary) hypertension: Secondary | ICD-10-CM | POA: Insufficient documentation

## 2022-06-13 DIAGNOSIS — J9811 Atelectasis: Secondary | ICD-10-CM | POA: Diagnosis not present

## 2022-06-13 DIAGNOSIS — J439 Emphysema, unspecified: Secondary | ICD-10-CM | POA: Diagnosis not present

## 2022-06-13 DIAGNOSIS — J449 Chronic obstructive pulmonary disease, unspecified: Secondary | ICD-10-CM | POA: Insufficient documentation

## 2022-06-13 DIAGNOSIS — R079 Chest pain, unspecified: Secondary | ICD-10-CM

## 2022-06-13 DIAGNOSIS — Z7982 Long term (current) use of aspirin: Secondary | ICD-10-CM | POA: Insufficient documentation

## 2022-06-13 LAB — HEPATIC FUNCTION PANEL
ALT: 12 U/L (ref 0–44)
AST: 11 U/L — ABNORMAL LOW (ref 15–41)
Albumin: 3.9 g/dL (ref 3.5–5.0)
Alkaline Phosphatase: 59 U/L (ref 38–126)
Bilirubin, Direct: 0.2 mg/dL (ref 0.0–0.2)
Indirect Bilirubin: 0.5 mg/dL (ref 0.3–0.9)
Total Bilirubin: 0.7 mg/dL (ref 0.3–1.2)
Total Protein: 7.7 g/dL (ref 6.5–8.1)

## 2022-06-13 LAB — CBC
HCT: 41.1 % (ref 36.0–46.0)
Hemoglobin: 13.2 g/dL (ref 12.0–15.0)
MCH: 28.1 pg (ref 26.0–34.0)
MCHC: 32.1 g/dL (ref 30.0–36.0)
MCV: 87.4 fL (ref 80.0–100.0)
Platelets: 239 10*3/uL (ref 150–400)
RBC: 4.7 MIL/uL (ref 3.87–5.11)
RDW: 16 % — ABNORMAL HIGH (ref 11.5–15.5)
WBC: 11.5 10*3/uL — ABNORMAL HIGH (ref 4.0–10.5)
nRBC: 0 % (ref 0.0–0.2)

## 2022-06-13 LAB — BASIC METABOLIC PANEL
Anion gap: 12 (ref 5–15)
BUN: 46 mg/dL — ABNORMAL HIGH (ref 8–23)
CO2: 17 mmol/L — ABNORMAL LOW (ref 22–32)
Calcium: 9.6 mg/dL (ref 8.9–10.3)
Chloride: 107 mmol/L (ref 98–111)
Creatinine, Ser: 1.26 mg/dL — ABNORMAL HIGH (ref 0.44–1.00)
GFR, Estimated: 45 mL/min — ABNORMAL LOW (ref >60)
Glucose, Bld: 112 mg/dL — ABNORMAL HIGH (ref 70–99)
Potassium: 4.8 mmol/L (ref 3.5–5.1)
Sodium: 136 mmol/L (ref 135–145)

## 2022-06-13 LAB — LIPASE, BLOOD: Lipase: 78 U/L — ABNORMAL HIGH (ref 11–51)

## 2022-06-13 LAB — TROPONIN I (HIGH SENSITIVITY)
Troponin I (High Sensitivity): 6 ng/L (ref <18)
Troponin I (High Sensitivity): 7 ng/L (ref <18)

## 2022-06-13 MED ORDER — LIDOCAINE 5 % EX PTCH: 1.0000 | MEDICATED_PATCH | CUTANEOUS | 0 refills | Status: DC

## 2022-06-13 MED ORDER — OXYCODONE-ACETAMINOPHEN 5-325 MG PO TABS
1.0000 | ORAL_TABLET | Freq: Once | ORAL | Status: AC
  Administered 2022-06-13: 1 via ORAL
  Filled 2022-06-13: qty 1

## 2022-06-13 MED ORDER — IOHEXOL 350 MG/ML SOLN
100.0000 mL | Freq: Once | INTRAVENOUS | Status: AC | PRN
  Administered 2022-06-13: 75 mL via INTRAVENOUS

## 2022-06-13 MED ORDER — LIDOCAINE 5 % EX PTCH
2.0000 | MEDICATED_PATCH | CUTANEOUS | Status: DC
  Administered 2022-06-13: 2 via TRANSDERMAL
  Filled 2022-06-13: qty 2

## 2022-06-13 NOTE — ED Triage Notes (Signed)
Patient here POV from Home.  Endorse CP that began Saturday PM. Endorses Pain has continued since it began. Sharp. Some SOB and Some Nausea. No Emesis. No Fevers. No Diarrhea.   Seen at another ED but LWBS due to Wait. Currently having CP.  NAD Noted during Triage. A&Ox4. GCS 15. Ambulatory with C[facility].

## 2022-06-13 NOTE — ED Notes (Signed)
Patient states the wait is too long and she is leaving 

## 2022-06-13 NOTE — ED Notes (Signed)
Patient husband came up to this writer yelling about the wait time. States "you better let someone know my wife has been here eleven hours now and is still having chest pain." Triage RN, who is also the charge, notified

## 2022-06-13 NOTE — ED Provider Notes (Signed)
[facility] EMERGENCY DEPT Provider Note   CSN: [id number] Arrival date & time: 06/13/22  1434     History  Chief Complaint  Patient presents with   Chest Pain    Junette Bernat Upshur is a 76 y.o. female.  With PMH of HTN, HLD, CAD, COPD presenting with left sided sharp chest pain.  Patient notes is her left mid and lower chest around the lower ribs and on the left side of her ribs and in the back region that is worse with movement and palpation.  She has had no associated sweating/diaphoresis, vomiting, radiation to the neck or arms, syncopal episodes.  She has had no recent leg pain or swelling.  No fevers or other URI symptoms.  Denies any shortness of breath or hemoptysis.  She tried taking Aleve at home without relief.   Chest Pain      Home Medications Prior to Admission medications   Medication Sig Start Date End Date Taking? Authorizing Provider  lidocaine (LIDODERM) 5 % Place 1 patch onto the skin daily. Remove & Discard patch within 12 hours or as directed by MD 06/13/22  Yes Elgie Congo, MD  acetaminophen (TYLENOL) 500 MG tablet Take 500-1,000 mg by mouth every 6 (six) hours as needed for headache.     [provider]  albuterol (VENTOLIN HFA) 108 (90 Base) MCG/ACT inhaler Inhale 2 puffs into the lungs every 4 (four) hours as needed for wheezing. Patient taking differently: Inhale 2 puffs into the lungs as needed for wheezing. 10/12/20   Collene Gobble, MD  allopurinol (ZYLOPRIM) 100 MG tablet TAKE 1 TABLET BY MOUTH EVERY DAY 04/18/22   [name], [name], MD  amLODipine (NORVASC) 5 MG tablet TAKE 1 TABLET (5 MG TOTAL) BY MOUTH DAILY. 09/23/21   Sherren Mocha, MD  APPLE CIDER VINEGAR PO Take 2,400 mg by mouth daily.    [provider]  ASPERCREME LIDOCAINE EX Apply 1 application topically daily as needed (pain).    [provider]  aspirin EC 81 MG tablet Take 81 mg by mouth daily.    [provider]  Cholecalciferol  (VITAMIN D3) 50 MCG (2000 UT) TABS Take 4,000 Units by mouth daily.     [provider]  clopidogrel (PLAVIX) 75 MG tablet TAKE 1 TABLET BY MOUTH EVERY DAY WITH BREAKFAST 06/13/22   Sherren Mocha, MD  Cyanocobalamin (VITAMIN B-12) 5000 MCG SUBL Place 5,000 mcg under the tongue daily.     [provider]  ezetimibe (ZETIA) 10 MG tablet TAKE ONE TABLET BY MOUTH DAILY 02/23/22   Sherren Mocha, MD  fluticasone [facility]) 50 MCG/ACT nasal spray SPRAY 2 SPRAYS INTO EACH NOSTRIL EVERY DAY Patient taking differently: Place 2 sprays into both nostrils daily. 02/20/20   Collene Gobble, MD  furosemide (LASIX) 40 MG tablet TAKE 1 TABLET BY MOUTH EVERY DAY 03/29/22   Sherren Mocha, MD  guaiFENesin (MUCINEX) 600 MG 12 hr tablet Take 600 mg by mouth 2 (two) times daily as needed (congestion).    [provider]  isosorbide mononitrate (IMDUR) 30 MG 24 hr tablet TAKE 1 TABLET BY MOUTH EVERY DAY 05/02/22   Sherren Mocha, MD  losartan (COZAAR) 100 MG tablet TAKE 1 TABLET BY MOUTH EVERY DAY 05/25/21   [name], [name], MD  metoprolol succinate (TOPROL-XL) 100 MG 24 hr tablet TAKE 1 TABLET BY MOUTH DAILY. TAKE WITH OR IMMEDIATELY FOLLOWING A MEAL. 02/14/22   Sherren Mocha, MD  Misc Natural Products [facility] ADVANCED)  CAPS Take 1,000 capsules by mouth daily.     [provider]  nitroGLYCERIN (NITROSTAT) 0.4 MG SL tablet PLACE 1 TABLET UNDER THE TONGUE EVERY 5 MINUTES AS NEEDED FOR CHEST PAIN 05/17/22   Sherren Mocha, MD  oxymetazoline (AFRIN) 0.05 % nasal spray Place 1 spray into both nostrils 2 (two) times daily as needed for congestion.    [provider]  pantoprazole (PROTONIX) 40 MG tablet TAKE 1 TABLET BY MOUTH EVERY DAY 01/28/22   [name], [name], MD  potassium chloride SA (KLOR-CON) 20 MEQ tablet Take 20 mEq by mouth daily.    [provider]  pravastatin (PRAVACHOL) 40 MG tablet TAKE 1 TABLET BY MOUTH EVERY DAY IN THE EVENING 03/15/22   Sherren Mocha, MD   Simethicone (GAS-X PO) Take 2 tablets by mouth daily as needed (gas).    [provider]  triamcinolone cream (KENALOG) 0.1 % APPLY TO AFFECTED AREA TWICE A DAY 11/22/21   [name], [name], MD      Allergies    Erythromycin and Sulfamethoxazole    Review of Systems   Review of Systems  Cardiovascular:  Positive for chest pain.    Physical Exam Updated Vital Signs BP (!) 141/68   Pulse 83   Temp 97.6 F (36.4 C)   Resp 20   Ht '5\' 5"'$  (1.651 m)   Wt 92.1 kg   SpO2 95%   BMI 33.79 kg/m  Physical Exam Constitutional: Alert and oriented. Well appearing and in no distress. Eyes: Conjunctivae are normal. ENT      Head: Normocephalic and atraumatic.      Nose: No congestion.      Mouth/Throat: Mucous membranes are moist.      Neck: No stridor. Cardiovascular: S1, S2,  Normal and symmetric distal pulses are present in all extremities.Warm and well perfused.  Reproducible tender chest pain of the left anterior and lateral lower chest wall with no external evidence of injury Respiratory: Normal respiratory effort. Breath sounds are normal.  O2 sat 95-97 on RA Gastrointestinal: Soft and nontender. There is no CVA tenderness. Musculoskeletal: Normal range of motion in all extremities. Bilateral nontender edema Neurologic: Normal speech and language. No gross focal neurologic deficits are appreciated. Skin: Skin is warm, dry and intact. No rash noted. Psychiatric: Mood and affect are normal. Speech and behavior are normal.  ED Results / Procedures / Treatments   Labs (all labs ordered are listed, but only abnormal results are displayed) Labs Reviewed  BASIC METABOLIC PANEL - Abnormal; Notable for the following components:      Result Value   CO2 17 (*)    Glucose, Bld 112 (*)    BUN 46 (*)    Creatinine, Ser 1.26 (*)    GFR, Estimated 45 (*)    All other components within normal limits  CBC - Abnormal; Notable for the following components:   WBC 11.5 (*)    RDW 16.0  (*)    All other components within normal limits  LIPASE, BLOOD - Abnormal; Notable for the following components:   Lipase 78 (*)    All other components within normal limits  HEPATIC FUNCTION PANEL - Abnormal; Notable for the following components:   AST 11 (*)    All other components within normal limits  TROPONIN I (HIGH SENSITIVITY)  TROPONIN I (HIGH SENSITIVITY)    EKG EKG Interpretation  Date/Time:  Monday June 13 2022 15:18:15 EDT Ventricular Rate:  72 PR Interval:  160 QRS Duration:  92 QT Interval:  414 QTC Calculation: 453 R Axis:   -11 Text Interpretation: Sinus rhythm with occasional Premature ventricular complexes Minimal voltage criteria for LVH, may be normal variant ( R in aVL ) Borderline ECG When compared with ECG of 12-Jun-2022 17:05, No significant change was found Confirmed by Georgina Snell [phone]) on 06/13/2022 8:11:50 PM  Radiology CT Angio Chest PE W and/or Wo Contrast  Result Date: 06/13/2022 CLINICAL DATA:  Left chest wall pain.  Shortness of breath. EXAM: CT ANGIOGRAPHY CHEST WITH CONTRAST TECHNIQUE: Multidetector CT imaging of the chest was performed using the standard protocol during bolus administration of intravenous contrast. Multiplanar CT image reconstructions and MIPs were obtained to evaluate the vascular anatomy. RADIATION DOSE REDUCTION: This exam was performed according to the departmental dose-optimization program which includes automated exposure control, adjustment of the mA and/or kV according to patient size and/or use of iterative reconstruction technique. CONTRAST:  31m OMNIPAQUE IOHEXOL 350 MG/ML SOLN COMPARISON:  CT angiogram chest abdomen and pelvis 11/28/2020 FINDINGS: Cardiovascular: Satisfactory opacification of the pulmonary arteries to the segmental level. No evidence of pulmonary embolism. Normal heart size. No pericardial effusion. There are atherosclerotic calcifications of the aorta and coronary arteries. Mediastinum/Nodes: No  enlarged mediastinal, hilar, or axillary lymph nodes. Thyroid gland, trachea, and esophagus demonstrate no significant findings. Lungs/Pleura: Mild emphysematous changes are present. There is minimal atelectasis in the right lung base. The lungs are otherwise clear. There is no pleural effusion or pneumothorax. Upper Abdomen: No acute abnormality. Musculoskeletal: Degenerative changes affect the spine. Review of the MIP images confirms the above findings. IMPRESSION: 1. No evidence for pulmonary embolism. 2. No acute cardiopulmonary process. 3. Aortic Atherosclerosis (ICD10-I70.0) and Emphysema (ICD10-J43.9). Electronically Signed   By: ARonney AstersM.D.   On: 06/13/2022 22:09   DG Chest 2 View  Result Date: 06/13/2022 CLINICAL DATA:  shortness of breath EXAM: CHEST - 2 VIEW COMPARISON:  June 12, 2022 FINDINGS: The heart size and mediastinal contours are within normal limits. No consolidation, pleural effusion or vascular congestion. Again seen is some opacity at the left lung base near the CP angle of likely atelectasis, stable. Prominent bronchovascular markings, stable as well. Moderate thoracic spondylosis. IMPRESSION: Opacity at the left lung base of likely minor atelectasis, stable. There has been no significant interval change. Electronically Signed   By: AFrazier RichardsM.D.   On: 06/13/2022 18:07   DG Chest 2 View  Result Date: 06/12/2022 CLINICAL DATA:  Chest pain EXAM: CHEST - 2 VIEW COMPARISON:  Chest x-ray dated January 16, 2022 FINDINGS: Cardiac and mediastinal contours are within normal limits. Mild bibasilar atelectasis. Lungs otherwise clear. No evidence of pleural effusion or pneumothorax. IMPRESSION: No active cardiopulmonary disease. Electronically Signed   By: LYetta GlassmanM.D.   On: 06/12/2022 18:16    Procedures Procedures  Remained on constant cardiac monitoring, normal sinus rhythm  Medications Ordered in ED Medications  lidocaine (LIDODERM) 5 % 2 patch (2 patches  Transdermal Patch Applied 06/13/22 2129)  oxyCODONE-acetaminophen (PERCOCET/ROXICET) 5-325 MG per tablet 1 tablet (1 tablet Oral Given 06/13/22 2117)  iohexol (OMNIPAQUE) 350 MG/ML injection 100 mL (75 mLs Intravenous Contrast Given 06/13/22 2139)    ED Course/ Medical Decision Making/ A&P                           Medical Decision Making BElva MauroWHosieis a 76y.o. female.  With PMH of HTN, HLD, CAD, COPD presenting with left  sided sharp chest pain.  Regarding the patient's chest pain, their HEART score is 4 moderate risk and overall have an EKG which is reassuring and unchanged from previous. Further evaluated for ACS with high-sensitivity troponin at least 3 hours from the patient's start of pain which were both reassuring and flat 6 then 7. CXR obtained and showed no evidence of pneumonia, pneumothorax, and pulmonary edema. I do not think PE because CTA PE scan showed no PE and additionally no pericardial effusion. I do not think aortic dissection as the patient is well-appearing, does not have ripping/tearing pain and equally has no pulse or neurologic deficits.  She had slightly elevated lipase 78 consistent with previous not consistent with acute pancreatitis nor did she have upper abdominal pain or pain suggestive of acute pancreatitis or gallbladder disease.  She is tolerating p.o. without vomiting.  Patient's chest pain is reproducible in nature which makes me suspect this is more consistent with a musculoskeletal etiology and improved with percocet and lidoderm patch.  Although her previous heart cath in 2021 showed some evidence of disease of the LAD, her EKG and troponins have been reassuring and her chest pain is reproducible which makes me suspect musculoskeletal etiology.  Her heart score is 4 and moderate risk in shared decision-making was strict return precautions, supportive care and follow-up with cardiology. She is in agreement with plan.   Amount and/or Complexity of Data  Reviewed External Data Reviewed: notes.    Details: 2021 heart cath " Right dominant coronary anatomy with no noted obstructive disease in the RCA.  Patent relatively short left main.  Large tortuous and angulated LAD with segmental 50 to 60% proximal to mid narrowing and tandem mid to distal 70% stenoses after the large first diagonal.  The diagonal contains 50% proximal narrowing.  The vessel is tortuous and heavily calcified.  Circumflex contains a long, possibly overlapping stented segment that is widely patent.  The vessel is heavily calcified.  Mid range LVEF estimated at 50% with EDP 14 m mercury.  Mild pulmonary hypertension with mean pressure 24 mmHg.  Capillary wedge pressure mean is 15 mmHg.  Pulmonary vascular resistance is 1.42 Wood units.  The minimal elevation in pressure is suggested to be WHO group 3 or 5. " Labs: ordered. Decision-making details documented in ED Course. Radiology: ordered and independent interpretation performed. Decision-making details documented in ED Course.    Details: Chest x-ray with no consolidation concerning for pneumonia ECG/medicine tests: independent interpretation performed. Decision-making details documented in ED Course.  Risk Prescription drug management.    Final Clinical Impression(s) / ED Diagnoses Final diagnoses:  Chest pain, unspecified type    Rx / DC Orders ED Discharge Orders          Ordered    Ambulatory referral to Cardiology       Comments: If you have not heard from the Cardiology office within the next 72 hours please call [phone].   06/13/22 2242    lidocaine (LIDODERM) 5 %  Every 24 hours        06/13/22 2242              Elgie Congo, MD 06/13/22 2329

## 2022-06-13 NOTE — ED Notes (Signed)
Pt verbalizes understanding of discharge instructions. Opportunity for questioning and answers were provided. Pt discharged from ED to home with husband.    

## 2022-06-13 NOTE — Discharge Instructions (Signed)
You were seen in the emergency department today for chest pain. You workup did not reveal a definite cause of your symptoms but was generally reassuring.  There was no evidence of blood clot, pneumonia, rib fracture.  Your EKG and labs did not suggest active heart attack at this time.  Return to the emergency department immediately if you develop recurrent, severe chest pain, shortness of breath, fainting spells, sudden sweatiness, or any other concerning symptoms.   Take Tylenol 1 g every 6-8 hours, ibuprofen 400 mg every 4-6 hours and the lidocaine patches as needed for pain.  Please also make an appointment to follow up with your primary care doctor or cardiologist within one week to assure improvement or resolution in symptoms. Further testing may be necessary, so it is extremely important to keep your follow-up appointment with your primary doctor.   If you have not heard from the Cardiology office within the next 72 hours please call [phone].

## 2022-06-14 ENCOUNTER — Ambulatory Visit: Payer: Medicare Other | Admitting: Rehabilitative and Restorative Service Providers"

## 2022-06-16 ENCOUNTER — Ambulatory Visit: Payer: Medicare Other

## 2022-06-16 DIAGNOSIS — R2681 Unsteadiness on feet: Secondary | ICD-10-CM

## 2022-06-16 DIAGNOSIS — R262 Difficulty in walking, not elsewhere classified: Secondary | ICD-10-CM | POA: Diagnosis not present

## 2022-06-16 DIAGNOSIS — G8929 Other chronic pain: Secondary | ICD-10-CM | POA: Diagnosis not present

## 2022-06-16 DIAGNOSIS — M25511 Pain in right shoulder: Secondary | ICD-10-CM | POA: Diagnosis not present

## 2022-06-16 DIAGNOSIS — M6281 Muscle weakness (generalized): Secondary | ICD-10-CM

## 2022-06-16 DIAGNOSIS — R42 Dizziness and giddiness: Secondary | ICD-10-CM | POA: Diagnosis not present

## 2022-06-16 NOTE — Therapy (Signed)
OUTPATIENT PHYSICAL THERAPY TREATMENT NOTE   Patient Name: Tracy [name] MRN: [id number] DOB:1946/01/02, 76 y.o., female Today's Date: 06/16/2022  PCP: [name], [name], MD  REFERRING PROVIDER: [name], [name], MD    END OF SESSION:   PT End of Session - 06/16/22 1017     Visit Number 24    Date for PT Re-Evaluation 06/24/22    Authorization Type Med A- KX modifier needed    Progress Note Due on Visit 30    PT Start Time 0928    PT Stop Time 1001    PT Time Calculation (min) 33 min    Activity Tolerance Patient tolerated treatment well    Behavior During Therapy [place] for tasks assessed/performed                 Past Medical History:  Diagnosis Date   Allergic rhinitis    Allergy    Anxiety    Arthritis    Breast cancer ([place]) 04/28/2020   rigtht breast   Bronchitis    CAD (coronary artery disease)    1/19 PCI/DES to [facility] for ISR, normal EF.    Cancer ([place])    hx of precancerous cells in right breast    Cataract    Cervical dysplasia    unsure of procedure, possible "burning" in her late 33s   CHF (congestive heart failure) ([place])    Echo 06/2019: EF 55-60, elevated LVEDP, normal RV SF, mild MAC, mild MR, trivial TR   Complication of anesthesia    COPD (chronic obstructive pulmonary disease) (HCC)    early    Depression    Dyspnea    with exertion    Family history of adverse reaction to anesthesia    daughter- problems wiht n/v   GERD (gastroesophageal reflux disease)    Gout    Headache(784.0)    Heart murmur    hx of years ago    Hyperlipidemia    Hypertension    Low back pain    Menopausal syndrome    Myocardial infarct ([place]) 2007   hx of   Overactive bladder    PONV (postoperative nausea and vomiting)    Sleep apnea    Past Surgical History:  Procedure Laterality Date   ANGIOPLASTY     stent 2007   BREAST LUMPECTOMY WITH RADIOACTIVE SEED LOCALIZATION Right 04/28/2020   Procedure: RIGHT BREAST LUMPECTOMY X 2  WITH RADIOACTIVE SEED  LOCALIZATION;  Surgeon: Donnie Mesa, MD;  Location: [facility];  Service: General;  Laterality: Right;  LMA   CARDIAC CATHETERIZATION     COLONOSCOPY     CORONARY STENT INTERVENTION N/A 11/06/2017   Procedure: CORONARY STENT INTERVENTION;  Surgeon: [name], MD;  Location: [facility];  Service: Cardiovascular;  Laterality: N/A;   LEFT HEART CATH AND CORONARY ANGIOGRAPHY N/A 11/06/2017   Procedure: LEFT HEART CATH AND CORONARY ANGIOGRAPHY;  Surgeon: [name], MD;  Location: [facility];  Service: Cardiovascular;  Laterality: N/A;   LEFT HEART CATH AND CORONARY ANGIOGRAPHY N/A 07/24/2019   Procedure: LEFT HEART CATH AND CORONARY ANGIOGRAPHY;  Surgeon: Burnell Blanks, MD;  Location: [facility];  Service: Cardiovascular;  Laterality: N/A;   RIGHT/LEFT HEART CATH AND CORONARY ANGIOGRAPHY N/A 09/29/2020   Procedure: RIGHT/LEFT HEART CATH AND CORONARY ANGIOGRAPHY;  Surgeon: Belva Crome, MD;  Location: [facility];  Service: Cardiovascular;  Laterality: N/A;   ROBOTIC ASSISTED BILATERAL SALPINGO OOPHERECTOMY Bilateral 11/10/2020  Procedure: XI ROBOTIC ASSISTED BILATERAL SALPINGO OOPHORECTOMY WITH MINI LAPAROTOMY FOR DRAINAGE;  Surgeon: Lafonda Mosses, MD;  Location: [facility];  Service: Gynecology;  Laterality: Bilateral;  MINI LAP FIRST   TONSILLECTOMY     VULVECTOMY N/A 11/10/2020   Procedure: WIDE EXCISION VULVECTOMY;  Surgeon: Lafonda Mosses, MD;  Location: [facility];  Service: Gynecology;  Laterality: N/A;   Patient Active Problem List   Diagnosis Date Noted   Dizziness 01/19/2022   Frequent falls 01/19/2022   Tremor of both hands 08/16/2021   Aortic atherosclerosis ([place]) 06/15/2021   Generalized osteoarthritis of multiple sites 12/25/2020   Chronic pain disorder 12/25/2020   Pelvic mass in female    Bilateral lower extremity edema 04/13/2020   Shortness of breath    Anxiety disorder 10/29/2018   CKD (chronic kidney  disease), stage III ([place]) 06/27/2018   COPD (chronic obstructive pulmonary disease) ([place]) 05/25/2018   Obesity 03/20/2018   Dyspnea on exertion 11/06/2017   Abnormal stress test 11/06/2017   Chronic diastolic CHF (congestive heart failure) ([place]) 10/03/2017   Chronic combined systolic and diastolic heart failure ([place]) 09/04/2017   Left ventricular dysfunction 07/31/2017   Cataract, nuclear sclerotic senile, bilateral 02/01/2017   Gouty arthritis of toe of left foot 08/09/2016   Hyperuricemia 08/09/2016   Osteoarthritis (arthritis due to wear and tear of joints) 01/14/2014   Coronary artery disease 08/02/2011   Mitral regurgitation 08/02/2011   OVERACTIVE BLADDER 07/02/2010   ACUTE CYSTITIS 05/25/2010   Pure hypercholesterolemia 08/11/2009   PEDAL EDEMA 07/22/2009   GERD 04/11/2008   Essential hypertension 05/30/2007   MYOCARDIAL INFARCTION, HX OF 05/30/2007   Allergic rhinitis 05/30/2007   LOW BACK PAIN 05/30/2007    REFERRING DIAG: M25.511,G89.29 (ICD-10-CM) - Chronic right shoulder pain   THERAPY DIAG:  Chronic right shoulder pain  Muscle weakness (generalized)  Unsteadiness on feet  Difficulty in walking, not elsewhere classified  Rationale for Evaluation and Treatment Rehabilitation  PERTINENT HISTORY: Dizziness, CVA, Hx of falls, OA  PRECAUTIONS: Fall Risk  SUBJECTIVE: I was having chest pain and went to ED.  They ruled out heart attack.  Something was off with my blood work and I'll see my primary care next week.  I am sore today.    PAIN:  Are you having pain? Yes: NPRS scale: 3/10 Pain location: right shoulder Pain description: burning Aggravating factors: use Relieving factors: rest  PATIENT GOALS:  Pt would like to be able to move around better without pain.  OBJECTIVE: (objective measures completed at initial evaluation unless otherwise dated)  DIAGNOSTIC FINDINGS:  01/16/2022 CT of cervical spine:  1. No acute fracture or traumatic subluxation of the  cervical spine.  2. Stable 3 mm right upper lobe nodule. No follow-up needed if patient is low-risk.This recommendation follows the consensus statement: Guidelines for Management of Incidental Pulmonary Nodules Detected on CT Images: From the Fleischner Society 2017; Radiology 2017; 284:228-243.   PATIENT SURVEYS:  03/09/2022: FOTO 51% (projected 58% by visit 11) 04/12/2022:  FOTO 59% 05/31/2022:  FOTO 70%   COGNITION:           Overall cognitive status: Within functional limits for tasks assessed                                  SENSATION: Light touch: WFL   POSTURE: Forward head, rounded shoulders   UPPER EXTREMITY ROM:    Active ROM Right 03/09/22 Right  04/14/22 Right 04/28/22 Right 05/31/22 Left 03/09/22  Shoulder flexion 115 128 130 138 137  Shoulder extension         Shoulder abduction 116 125 127 138 125  Shoulder adduction         Shoulder internal rotation         Shoulder external rotation         Elbow flexion         Elbow extension         Wrist flexion         Wrist extension         Wrist ulnar deviation         Wrist radial deviation         Wrist pronation         Wrist supination         (Blank rows = not tested)   UPPER EXTREMITY MMT:   MMT Right 03/09/2022 Right 04/12/2022 Left 03/09/2022  Shoulder flexion 4 4+ 5  Shoulder extension       Shoulder abduction _0 Shoulder adduction       Shoulder internal rotation 4 4+ 5  Shoulder external rotation _1 Middle trapezius       Lower trapezius       Elbow flexion       Elbow extension       Wrist flexion       Wrist extension       Wrist ulnar deviation       Wrist radial deviation       Wrist pronation       Wrist supination       Grip strength (lbs)       (Blank rows = not tested)   SHOULDER SPECIAL TESTS:            Impingement tests: Hawkins/Kennedy impingement test: positive right            Rotator cuff assessment: Empty can test: negative   JOINT MOBILITY TESTING:  Crepetis  noted in right shoulder   PALPATION:  Tender to palpation along shoulder joint   FUNCTIONAL TESTING: 03/09/2022: 5 times sit to/from stand:  18.7 seconds with UE use TUG 19.3 sec with [facility]               03/29/22: 5x sit to stand: 23 seconds (pt reports dizziness today)    04/05/22: TUG 14.5 seconds   04/12/2022:   5 times sit to/from stand:  14.4 seconds with UE use TUG in 12.97 sec without assistive device  04/28/2022: 5 times sit to/from stand:  14.2 seconds with UE use  TODAY'S TREATMENT:   06/16/2022: Nustep level 5 x6 min with PT present to discuss status Seated with red theraband: shoulder ER, horizontal abduction.  BUE 2x10 Shoulder flexion, bicep curls, shoulder abduction and chest press with 2# dumbbells 2x12 each bilat Standing rows and extension with green tband 2x12 Wall wash for flexion and abduction 2x10 RUE each Wall push ups 2x10 Shoulder flexion with serratus activation with green loop 2x10  06/09/2022: Nustep level 5 x6 min with PT present to discuss status Seated with red theraband: shoulder ER, horizontal abduction.  BUE 2x10 Shoulder flexion, bicep curls, shoulder abduction and chest press with 2# dumbbells 2x12 each bilat Standing rows and extension with green tband 2x12 Wall wash for flexion and abduction 2x10 RUE each Wall push ups 2x10 Shoulder flexion with serratus activation with green loop 2x10  06/07/2022: Nustep level 5 x6 min with PT present to discuss status Seated with red theraband: shoulder ER, horizontal abduction.  BUE 2x10 Shoulder flexion, bicep curls, and chest press with 2# dumbbells 2x12 each bilat  Standing rows and extension with green tband 2x10 Wall wash for flexion and abduction 2x10 RUE each Wall push ups 2x10 Ambulation down hallway with SPC with horizontal and vertical head turns x2 laps each   PATIENT EDUCATION: Education details: Issued HEP and provided with yellow tband Person educated: Patient Education method:  Explanation, Demonstration, and Handouts Education comprehension: verbalized understanding and returned demonstration     HOME EXERCISE PROGRAM: Access Code: [email or web address] URL: [email or web address] Date: 06/02/2022 Prepared by: Shelby Dubin Menke  Exercises - Shoulder External Rotation and Scapular Retraction with Resistance  - 1-2 x daily - 7 x weekly - 2 sets - 10 reps - Standing Shoulder Horizontal Abduction with Resistance  - 1-2 x daily - 7 x weekly - 2 sets - 10 reps - Seated Scapular Retraction  - 1-2 x daily - 7 x weekly - 2 sets - 10 reps - Seated Upper Trapezius Stretch  - 1 x daily - 7 x weekly - 1 sets - 2 reps - 20 sec hold - Seated Levator Scapulae Stretch  - 1 x daily - 7 x weekly - 1 sets - 2 reps - 20 sec hold - Wall Push Up  - 1 x daily - 7 x weekly - 2 sets - 10 reps - Doorway Pec Stretch at 90 Degrees Abduction  - 1 x daily - 7 x weekly - 1 sets - 2 reps - 20 sec hold   ASSESSMENT:   CLINICAL IMPRESSION: Pt has not been doing her HEP due to being in the ED this week.  She was having chest pain and MI was ruled out.  Pt reports some fatigue and soreness today from the events of the week.  PT monitored pt throughout session for pain and fatigue.  She did fatigue more quickly today. Pt continues to report that she can tell that she has gotten stronger since starting skilled PT.  Pt continues to require skilled PT to progress with goal related activities.     OBJECTIVE IMPAIRMENTS decreased balance, difficulty walking, decreased ROM, decreased strength, dizziness, increased muscle spasms, impaired UE functional use, postural dysfunction, and pain.    ACTIVITY LIMITATIONS carrying and lifting   PARTICIPATION LIMITATIONS: cleaning and driving   PERSONAL FACTORS Time since onset of injury/illness/exacerbation and 3+ comorbidities: COPD, CHF, HTN, OA  are also affecting patient's functional outcome.    REHAB POTENTIAL: Good   CLINICAL DECISION MAKING:  Evolving/moderate complexity   EVALUATION COMPLEXITY: Moderate     GOALS: Goals reviewed with patient? Yes   SHORT TERM GOALS: Target date: 03/30/2022     Pt will be independent with initial HEP. Baseline: Goal status: Goal Met 03/25/2022   2.  Pt will report at least a 40% improvement in symptoms since starting PT. Baseline: 40-45%  (04/05/22) Goal status: MET     LONG TERM GOALS: Target date: 06/24/2022     Pt will be independent with advanced HEP. Baseline:  Goal status: Ongoing   2.  Pt will increase FOTO to 58% to demonstrate her improvements in functional mobility. Baseline: 70 Goal status: Goal Met on 04/12/2022   3.  Pt will report being able to fasten/unfasten her bra without increased pain. Baseline: burning pain (04/05/22) Goal status: Goal Met on 05/31/2022   4.  Pt will  decrease time on TUG to 14 seconds or less without assistive device and good safety to decrease risk of falling. Baseline: 14.5 seconds (04/05/22) Goal status: Goal Met 04/12/2022 (12.97 sec)   5.  Pt will increase right shoulder strength to at least 4+/5 to allow her to carry groceries without difficulty. Baseline:  Goal status: Ongoing (see above)   6.  Pt will state no dizziness with functional tasks, including reaching during grocery shopping for at least the week prior to discharge. Baseline: Pt reports occasionally having dizziness and losing balance (04/28/22) Goal status: Ongoing  7.  Pt will be able to put away dishes in her home with pain no greater than 2/10.  Baseline:  Pain 4/10  Goal status:  Ongoing     PLAN: PT FREQUENCY: 2x/week   PT DURATION: 8 weeks   PLANNED INTERVENTIONS: Therapeutic exercises, Therapeutic activity, Neuromuscular re-education, Balance training, Gait training, Patient/Family education, Joint manipulation, Joint mobilization, Stair training, Vestibular training, Aquatic Therapy, Dry Needling, Electrical stimulation, Spinal manipulation, Spinal mobilization,  Cryotherapy, Moist heat, Taping, Vasopneumatic device, Traction, Ultrasound, Ionotophoresis 80m/ml Dexamethasone, Manual therapy, and Re-evaluation   PLAN FOR NEXT SESSION:  Rt shoulder and scapular strength, balance as indicated     KSigurd Sos PT 06/16/22 10:20 AM   [facility] CenterSpecial[facility] [address]adesGNam[address] [place] [address]Phone # [phone] [phone]

## 2022-06-21 ENCOUNTER — Ambulatory Visit: Payer: Medicare Other | Attending: Family Medicine | Admitting: Rehabilitative and Restorative Service Providers"

## 2022-06-21 ENCOUNTER — Encounter: Payer: Self-pay | Admitting: Rehabilitative and Restorative Service Providers"

## 2022-06-21 DIAGNOSIS — R296 Repeated falls: Secondary | ICD-10-CM | POA: Diagnosis not present

## 2022-06-21 DIAGNOSIS — R42 Dizziness and giddiness: Secondary | ICD-10-CM | POA: Insufficient documentation

## 2022-06-21 DIAGNOSIS — R2681 Unsteadiness on feet: Secondary | ICD-10-CM | POA: Diagnosis not present

## 2022-06-21 DIAGNOSIS — R262 Difficulty in walking, not elsewhere classified: Secondary | ICD-10-CM | POA: Diagnosis not present

## 2022-06-21 DIAGNOSIS — M25511 Pain in right shoulder: Secondary | ICD-10-CM | POA: Diagnosis not present

## 2022-06-21 DIAGNOSIS — G8929 Other chronic pain: Secondary | ICD-10-CM | POA: Insufficient documentation

## 2022-06-21 DIAGNOSIS — M6281 Muscle weakness (generalized): Secondary | ICD-10-CM | POA: Insufficient documentation

## 2022-06-21 NOTE — Therapy (Signed)
OUTPATIENT PHYSICAL THERAPY TREATMENT NOTE   Patient Name: Tracy [name] MRN: [id number] DOB:1945-12-10, 76 y.o., female Today's Date: 06/21/2022  PCP: [name], [name], MD  REFERRING PROVIDER: [name], [name], MD    END OF SESSION:   PT End of Session - 06/21/22 0934     Visit Number 25    Date for PT Re-Evaluation 06/24/22    Authorization Type Med A- KX modifier needed    Progress Note Due on Visit 30    PT Start Time 0930    PT Stop Time 1010    PT Time Calculation (min) 40 min    Activity Tolerance Patient tolerated treatment well    Behavior During Therapy [facility] for tasks assessed/performed                 Past Medical History:  Diagnosis Date   Allergic rhinitis    Allergy    Anxiety    Arthritis    Breast cancer ([place]) 04/28/2020   rigtht breast   Bronchitis    CAD (coronary artery disease)    1/19 PCI/DES to [facility] for ISR, normal EF.    Cancer ([place])    hx of precancerous cells in right breast    Cataract    Cervical dysplasia    unsure of procedure, possible "burning" in her late 8s   CHF (congestive heart failure) ([place])    Echo 06/2019: EF 55-60, elevated LVEDP, normal RV SF, mild MAC, mild MR, trivial TR   Complication of anesthesia    COPD (chronic obstructive pulmonary disease) (HCC)    early    Depression    Dyspnea    with exertion    Family history of adverse reaction to anesthesia    daughter- problems wiht n/v   GERD (gastroesophageal reflux disease)    Gout    Headache(784.0)    Heart murmur    hx of years ago    Hyperlipidemia    Hypertension    Low back pain    Menopausal syndrome    Myocardial infarct ([place]) 2007   hx of   Overactive bladder    PONV (postoperative nausea and vomiting)    Sleep apnea    Past Surgical History:  Procedure Laterality Date   ANGIOPLASTY     stent 2007   BREAST LUMPECTOMY WITH RADIOACTIVE SEED LOCALIZATION Right 04/28/2020   Procedure: RIGHT BREAST LUMPECTOMY X 2  WITH RADIOACTIVE SEED  LOCALIZATION;  Surgeon: Donnie Mesa, MD;  Location: [place];  Service: General;  Laterality: Right;  LMA   CARDIAC CATHETERIZATION     COLONOSCOPY     CORONARY STENT INTERVENTION N/A 11/06/2017   Procedure: CORONARY STENT INTERVENTION;  Surgeon: [name], MD;  Location: [place] [facility];  Service: Cardiovascular;  Laterality: N/A;   LEFT HEART CATH AND CORONARY ANGIOGRAPHY N/A 11/06/2017   Procedure: LEFT HEART CATH AND CORONARY ANGIOGRAPHY;  Surgeon: [name], MD;  Location: [place] [facility];  Service: Cardiovascular;  Laterality: N/A;   LEFT HEART CATH AND CORONARY ANGIOGRAPHY N/A 07/24/2019   Procedure: LEFT HEART CATH AND CORONARY ANGIOGRAPHY;  Surgeon: Burnell Blanks, MD;  Location: [place] [facility];  Service: Cardiovascular;  Laterality: N/A;   RIGHT/LEFT HEART CATH AND CORONARY ANGIOGRAPHY N/A 09/29/2020   Procedure: RIGHT/LEFT HEART CATH AND CORONARY ANGIOGRAPHY;  Surgeon: Belva Crome, MD;  Location: [place] [facility];  Service: Cardiovascular;  Laterality: N/A;   ROBOTIC ASSISTED BILATERAL SALPINGO OOPHERECTOMY Bilateral 11/10/2020  Procedure: XI ROBOTIC ASSISTED BILATERAL SALPINGO OOPHORECTOMY WITH MINI LAPAROTOMY FOR DRAINAGE;  Surgeon: Lafonda Mosses, MD;  Location: [facility];  Service: Gynecology;  Laterality: Bilateral;  MINI LAP FIRST   TONSILLECTOMY     VULVECTOMY N/A 11/10/2020   Procedure: WIDE EXCISION VULVECTOMY;  Surgeon: Lafonda Mosses, MD;  Location: [facility];  Service: Gynecology;  Laterality: N/A;   Patient Active Problem List   Diagnosis Date Noted   Dizziness 01/19/2022   Frequent falls 01/19/2022   Tremor of both hands 08/16/2021   Aortic atherosclerosis ([place]) 06/15/2021   Generalized osteoarthritis of multiple sites 12/25/2020   Chronic pain disorder 12/25/2020   Pelvic mass in female    Bilateral lower extremity edema 04/13/2020   Shortness of breath    Anxiety disorder 10/29/2018   CKD (chronic kidney  disease), stage III ([place]) 06/27/2018   COPD (chronic obstructive pulmonary disease) ([place]) 05/25/2018   Obesity 03/20/2018   Dyspnea on exertion 11/06/2017   Abnormal stress test 11/06/2017   Chronic diastolic CHF (congestive heart failure) ([place]) 10/03/2017   Chronic combined systolic and diastolic heart failure ([place]) 09/04/2017   Left ventricular dysfunction 07/31/2017   Cataract, nuclear sclerotic senile, bilateral 02/01/2017   Gouty arthritis of toe of left foot 08/09/2016   Hyperuricemia 08/09/2016   Osteoarthritis (arthritis due to wear and tear of joints) 01/14/2014   Coronary artery disease 08/02/2011   Mitral regurgitation 08/02/2011   OVERACTIVE BLADDER 07/02/2010   ACUTE CYSTITIS 05/25/2010   Pure hypercholesterolemia 08/11/2009   PEDAL EDEMA 07/22/2009   GERD 04/11/2008   Essential hypertension 05/30/2007   MYOCARDIAL INFARCTION, HX OF 05/30/2007   Allergic rhinitis 05/30/2007   LOW BACK PAIN 05/30/2007    REFERRING DIAG: M25.511,G89.29 (ICD-10-CM) - Chronic right shoulder pain   THERAPY DIAG:  Chronic right shoulder pain  Muscle weakness (generalized)  Unsteadiness on feet  Difficulty in walking, not elsewhere classified  Dizziness and giddiness  Repeated falls  Rationale for Evaluation and Treatment Rehabilitation  PERTINENT HISTORY: Dizziness, CVA, Hx of falls, OA  PRECAUTIONS: Fall Risk  SUBJECTIVE: Pt reports that she is feeling better than last week.  States that she still has some soreness over her right AC joint.  PAIN:  Are you having pain? Yes: NPRS scale: 3/10 Pain location: right shoulder Pain description: burning Aggravating factors: use Relieving factors: rest  PATIENT GOALS:  Pt would like to be able to move around better without pain.  OBJECTIVE: (objective measures completed at initial evaluation unless otherwise dated)  DIAGNOSTIC FINDINGS:  01/16/2022 CT of cervical spine:  1. No acute fracture or traumatic subluxation of the  cervical spine.  2. Stable 3 mm right upper lobe nodule. No follow-up needed if patient is low-risk.This recommendation follows the consensus statement: Guidelines for Management of Incidental Pulmonary Nodules Detected on CT Images: From the Fleischner Society 2017; Radiology 2017; 284:228-243.   PATIENT SURVEYS:  03/09/2022: FOTO 51% (projected 58% by visit 11) 04/12/2022:  FOTO 59% 05/31/2022:  FOTO 70%   COGNITION:           Overall cognitive status: Within functional limits for tasks assessed                                  SENSATION: Light touch: WFL   POSTURE: Forward head, rounded shoulders   UPPER EXTREMITY ROM:    Active ROM Right 03/09/22 Right 04/14/22 Right 04/28/22 Right 05/31/22 Left 03/09/22  Shoulder flexion 115 128 130 138 137  Shoulder extension         Shoulder abduction 116 125 127 138 125  Shoulder adduction         Shoulder internal rotation         Shoulder external rotation         Elbow flexion         Elbow extension         Wrist flexion         Wrist extension         Wrist ulnar deviation         Wrist radial deviation         Wrist pronation         Wrist supination         (Blank rows = not tested)   UPPER EXTREMITY MMT:   MMT Right 03/09/2022 Right 04/12/2022 Left 03/09/2022  Shoulder flexion 4 4+ 5  Shoulder extension       Shoulder abduction _0 Shoulder adduction       Shoulder internal rotation 4 4+ 5  Shoulder external rotation _1 Middle trapezius       Lower trapezius       Elbow flexion       Elbow extension       Wrist flexion       Wrist extension       Wrist ulnar deviation       Wrist radial deviation       Wrist pronation       Wrist supination       Grip strength (lbs)       (Blank rows = not tested)   SHOULDER SPECIAL TESTS:            Impingement tests: Hawkins/Kennedy impingement test: positive right            Rotator cuff assessment: Empty can test: negative   JOINT MOBILITY TESTING:  Crepetis  noted in right shoulder   PALPATION:  Tender to palpation along shoulder joint   FUNCTIONAL TESTING: 03/09/2022: 5 times sit to/from stand:  18.7 seconds with UE use TUG 19.3 sec with [facility]               03/29/22: 5x sit to stand: 23 seconds (pt reports dizziness today)    04/05/22: TUG 14.5 seconds   04/12/2022:   5 times sit to/from stand:  14.4 seconds with UE use TUG in 12.97 sec without assistive device  04/28/2022: 5 times sit to/from stand:  14.2 seconds with UE use  TODAY'S TREATMENT:   06/21/2022: Nustep level 5 x7 min with PT present to discuss status Seated with red theraband: shoulder ER, horizontal abduction.  BUE 2x10 Shoulder flexion, bicep curls, shoulder abduction and chest press with 2# dumbbells 2x12 each bilat Standing rows and extension with green tband 2x12 Wall wash for flexion and abduction 2x10 RUE each Wall push ups 2x10 Shoulder flexion with serratus activation with green loop 2x10  06/16/2022: Nustep level 5 x6 min with PT present to discuss status Seated with red theraband: shoulder ER, horizontal abduction.  BUE 2x10 Shoulder flexion, bicep curls, shoulder abduction and chest press with 2# dumbbells 2x12 each bilat Standing rows and extension with green tband 2x12 Wall wash for flexion and abduction 2x10 RUE each Wall push ups 2x10 Shoulder flexion with serratus activation with green loop 2x10  06/09/2022: Nustep level 5 x6 min with  PT present to discuss status Seated with red theraband: shoulder ER, horizontal abduction.  BUE 2x10 Shoulder flexion, bicep curls, shoulder abduction and chest press with 2# dumbbells 2x12 each bilat Standing rows and extension with green tband 2x12 Wall wash for flexion and abduction 2x10 RUE each Wall push ups 2x10 Shoulder flexion with serratus activation with green loop 2x10    PATIENT EDUCATION: Education details: Issued HEP and provided with yellow tband Person educated: Patient Education method: Explanation,  Demonstration, and Handouts Education comprehension: verbalized understanding and returned demonstration     HOME EXERCISE PROGRAM: Access Code: [email or web address] URL: [email or web address] Date: 06/02/2022 Prepared by: Shelby Dubin Carie Kapuscinski  Exercises - Shoulder External Rotation and Scapular Retraction with Resistance  - 1-2 x daily - 7 x weekly - 2 sets - 10 reps - Standing Shoulder Horizontal Abduction with Resistance  - 1-2 x daily - 7 x weekly - 2 sets - 10 reps - Seated Scapular Retraction  - 1-2 x daily - 7 x weekly - 2 sets - 10 reps - Seated Upper Trapezius Stretch  - 1 x daily - 7 x weekly - 1 sets - 2 reps - 20 sec hold - Seated Levator Scapulae Stretch  - 1 x daily - 7 x weekly - 1 sets - 2 reps - 20 sec hold - Wall Push Up  - 1 x daily - 7 x weekly - 2 sets - 10 reps - Doorway Pec Stretch at 90 Degrees Abduction  - 1 x daily - 7 x weekly - 1 sets - 2 reps - 20 sec hold   ASSESSMENT:   CLINICAL IMPRESSION: Pt states that she goes to follow up with her PCP later today.  States that she has not had anymore chest pain.  Pt able to progress with exercises and has overall been compliant with HEP.  Pt states that her shoulder may be feeling better since the last injection, but she does still have some AC joint tenderness.  Will reassess next visit, but anticipate discharge next visit.     OBJECTIVE IMPAIRMENTS decreased balance, difficulty walking, decreased ROM, decreased strength, dizziness, increased muscle spasms, impaired UE functional use, postural dysfunction, and pain.    ACTIVITY LIMITATIONS carrying and lifting   PARTICIPATION LIMITATIONS: cleaning and driving   PERSONAL FACTORS Time since onset of injury/illness/exacerbation and 3+ comorbidities: COPD, CHF, HTN, OA  are also affecting patient's functional outcome.    REHAB POTENTIAL: Good   CLINICAL DECISION MAKING: Evolving/moderate complexity   EVALUATION COMPLEXITY: Moderate     GOALS: Goals reviewed with  patient? Yes   SHORT TERM GOALS: Target date: 03/30/2022     Pt will be independent with initial HEP. Baseline: Goal status: Goal Met 03/25/2022   2.  Pt will report at least a 40% improvement in symptoms since starting PT. Baseline: 40-45%  (04/05/22) Goal status: MET     LONG TERM GOALS: Target date: 06/24/2022     Pt will be independent with advanced HEP. Baseline:  Goal status: Goal Met 06/21/2022   2.  Pt will increase FOTO to 58% to demonstrate her improvements in functional mobility. Baseline: 70 Goal status: Goal Met on 04/12/2022   3.  Pt will report being able to fasten/unfasten her bra without increased pain. Baseline: burning pain (04/05/22) Goal status: Goal Met on 05/31/2022   4.  Pt will decrease time on TUG to 14 seconds or less without assistive device and good safety to decrease risk of falling. Baseline: 14.5 seconds (04/05/22) Goal  status: Goal Met 04/12/2022 (12.97 sec)   5.  Pt will increase right shoulder strength to at least 4+/5 to allow her to carry groceries without difficulty. Baseline:  Goal status: Ongoing (see above)   6.  Pt will state no dizziness with functional tasks, including reaching during grocery shopping for at least the week prior to discharge. Baseline: Pt reports occasionally having dizziness and losing balance (04/28/22) Goal status: Ongoing  7.  Pt will be able to put away dishes in her home with pain no greater than 2/10.  Baseline:  Pain 4/10  Goal status:  Ongoing     PLAN: PT FREQUENCY: 2x/week   PT DURATION: 8 weeks   PLANNED INTERVENTIONS: Therapeutic exercises, Therapeutic activity, Neuromuscular re-education, Balance training, Gait training, Patient/Family education, Joint manipulation, Joint mobilization, Stair training, Vestibular training, Aquatic Therapy, Dry Needling, Electrical stimulation, Spinal manipulation, Spinal mobilization, Cryotherapy, Moist heat, Taping, Vasopneumatic device, Traction, Ultrasound, Ionotophoresis  35m/ml Dexamethasone, Manual therapy, and Re-evaluation   PLAN FOR NEXT SESSION:  Rt shoulder and scapular strength, balance as indicated     SJuel Burrow PT 06/21/22 10:14 AM   [address] [facility] [address] [place] [address]Phone # [phone] [phone]

## 2022-06-21 NOTE — Progress Notes (Signed)
HPI: Tracy [name] is a 76 y.o. female, who is here today to follow on recent hospital visit. Evaluated in the ED on 06/13/2022 due to chest pain. Pain involved upper abdomen, lower left-sided CP,and left-sided back pain. Pain was "bad", shooting like pain, mild nausea. She was having some constipation, 2-3 days of no bowel movement. She has had some loose stools after taking OTC laxative. She has not noted melena or blood in stool. Mild nausea. CP and back pain have resolved. Negative for palpitations or worsening SOB.  Still having upper abdominal pain, mild, described as "discomfort."  She left ED after waiting for 14 hours and went to a different facility. Troponin x 3 negative.  CKD III: e GFR 45 (33 and 32) and Cr 1.2 (1.6-1.5). She has not noted foam in urine,gross hematuria,or decreased urine output. Lab Results  Component Value Date   CREATININE 1.26 (H) 06/13/2022   BUN 46 (H) 06/13/2022   NA 136 06/13/2022   K 4.8 06/13/2022   CL 107 06/13/2022   CO2 17 (L) 06/13/2022   Lab Results  Component Value Date   WBC 11.5 (H) 06/13/2022   HGB 13.2 06/13/2022   HCT 41.1 06/13/2022   MCV 87.4 06/13/2022   PLT 239 06/13/2022   She saw her cardiologist last on 01/11/22. Echo 06/2021: LVEF > 24% and diastolic dysfunction. Negative for orthopnea, PND,or worsening LE edema. EKG done during recent ED visit + PVC's. COPD on Albuterol inh. She has not noted cough or wheezing.  Lipase was elevated, it has been elevated for sometime. In 11/2020 during hospitalization she had abdominal CTA and CT. Hepatobiliary: No focal liver abnormality. No gallstones,gallbladder wall thickening, or pericholecystic fluid. No biliary dilatation.  Pancreas: No focal lesion. Normal pancreatic contour. No surrounding inflammatory changes. No main pancreatic ductal dilatation.  Lab Results  Component Value Date   LIPASE 78 (H) 06/13/2022   Lab Results  Component Value Date   ALT 12  06/13/2022   AST 11 (L) 06/13/2022   ALKPHOS 59 06/13/2022   BILITOT 0.7 06/13/2022   Review of Systems  Constitutional:  Positive for fatigue. Negative for activity change, appetite change and fever.  HENT:  Negative for mouth sores, nosebleeds and sore throat.   Eyes:  Negative for redness and visual disturbance.  Cardiovascular:  Negative for chest pain.  Gastrointestinal:  Negative for vomiting.       Negative for changes in bowel habits.  Genitourinary:  Negative for difficulty urinating and dysuria.  Musculoskeletal:  Positive for gait problem.  Neurological:  Negative for syncope and facial asymmetry.  Rest see pertinent positives and negatives per HPI.  Current Outpatient Medications on File Prior to Visit  Medication Sig Dispense Refill   acetaminophen (TYLENOL) 500 MG tablet Take 500-1,000 mg by mouth every 6 (six) hours as needed for headache.      albuterol (VENTOLIN HFA) 108 (90 Base) MCG/ACT inhaler Inhale 2 puffs into the lungs every 4 (four) hours as needed for wheezing. (Patient taking differently: Inhale 2 puffs into the lungs as needed for wheezing.) 1 each 3   allopurinol (ZYLOPRIM) 100 MG tablet TAKE 1 TABLET BY MOUTH EVERY DAY 90 tablet 2   amLODipine (NORVASC) 5 MG tablet TAKE 1 TABLET (5 MG TOTAL) BY MOUTH DAILY. 90 tablet 3   APPLE CIDER VINEGAR PO Take 2,400 mg by mouth daily.     ASPERCREME LIDOCAINE EX Apply 1 application topically daily as needed (pain).     aspirin  EC 81 MG tablet Take 81 mg by mouth daily.     Cholecalciferol (VITAMIN D3) 50 MCG (2000 UT) TABS Take 4,000 Units by mouth daily.      clopidogrel (PLAVIX) 75 MG tablet TAKE 1 TABLET BY MOUTH EVERY DAY WITH BREAKFAST 90 tablet 2   Cyanocobalamin (VITAMIN B-12) 5000 MCG SUBL Place 5,000 mcg under the tongue daily.      ezetimibe (ZETIA) 10 MG tablet TAKE ONE TABLET BY MOUTH DAILY 90 tablet 3   fluticasone (FLONASE) 50 MCG/ACT nasal spray SPRAY 2 SPRAYS INTO EACH NOSTRIL EVERY DAY (Patient taking  differently: Place 2 sprays into both nostrils daily.) 48 mL 1   furosemide (LASIX) 40 MG tablet TAKE 1 TABLET BY MOUTH EVERY DAY 90 tablet 3   guaiFENesin (MUCINEX) 600 MG 12 hr tablet Take 600 mg by mouth 2 (two) times daily as needed (congestion).     isosorbide mononitrate (IMDUR) 30 MG 24 hr tablet TAKE 1 TABLET BY MOUTH EVERY DAY 90 tablet 3   losartan (COZAAR) 100 MG tablet TAKE 1 TABLET BY MOUTH EVERY DAY 90 tablet 3   metoprolol succinate (TOPROL-XL) 100 MG 24 hr tablet TAKE 1 TABLET BY MOUTH DAILY. TAKE WITH OR IMMEDIATELY FOLLOWING A MEAL. 90 tablet 3   Misc Natural Products (TART CHERRY ADVANCED) CAPS Take 1,000 capsules by mouth daily.      nitroGLYCERIN (NITROSTAT) 0.4 MG SL tablet PLACE 1 TABLET UNDER THE TONGUE EVERY 5 MINUTES AS NEEDED FOR CHEST PAIN 25 tablet 6   oxymetazoline (AFRIN) 0.05 % nasal spray Place 1 spray into both nostrils 2 (two) times daily as needed for congestion.     pantoprazole (PROTONIX) 40 MG tablet TAKE 1 TABLET BY MOUTH EVERY DAY 90 tablet 2   potassium chloride SA (KLOR-CON) 20 MEQ tablet Take 20 mEq by mouth daily.     pravastatin (PRAVACHOL) 40 MG tablet TAKE 1 TABLET BY MOUTH EVERY DAY IN THE EVENING 90 tablet 3   Simethicone (GAS-X PO) Take 2 tablets by mouth daily as needed (gas).     triamcinolone cream (KENALOG) 0.1 % APPLY TO AFFECTED AREA TWICE A DAY 30 g 1   Current Facility-Administered Medications on File Prior to Visit  Medication Dose Route Frequency Provider Last Rate Last Admin   0.9 %  sodium chloride infusion  500 mL Intravenous Once Irene Shipper, MD       Past Medical History:  Diagnosis Date   Allergic rhinitis    Allergy    Anxiety    Arthritis    Breast cancer ([place]) 04/28/2020   rigtht breast   Bronchitis    CAD (coronary artery disease)    1/19 PCI/DES to [place] for ISR, normal EF.    Cancer ([place])    hx of precancerous cells in right breast    Cataract    Cervical dysplasia    unsure of procedure, possible "burning" in  her late 23s   CHF (congestive heart failure) ([place])    Echo 06/2019: EF 55-60, elevated LVEDP, normal RV SF, mild MAC, mild MR, trivial TR   Complication of anesthesia    COPD (chronic obstructive pulmonary disease) (HCC)    early    Depression    Dyspnea    with exertion    Family history of adverse reaction to anesthesia    daughter- problems wiht n/v   GERD (gastroesophageal reflux disease)    Gout    Headache(784.0)    Heart murmur  hx of years ago    Hyperlipidemia    Hypertension    Low back pain    Menopausal syndrome    Myocardial infarct ([place]) 2007   hx of   Overactive bladder    PONV (postoperative nausea and vomiting)    Sleep apnea    Allergies  Allergen Reactions   Erythromycin Rash    But isn't certain   Sulfamethoxazole Rash    Social History   Socioeconomic History   Marital status: Married    Spouse name: Not on file   Number of children: 2   Years of education: Not on file   Highest education level: GED or equivalent  Occupational History   Occupation: retired   Occupation: retired    Comment: Scientist, physiological  Tobacco Use   Smoking status: Former    Packs/day: 1.25    Years: 52.00    Total pack years: 65.00    Types: Cigarettes    Quit date: 2018    Years since quitting: 5.6   Smokeless tobacco: Never   Tobacco comments:    completely quit May of 2018; period of years she did not smoke   Vaping Use   Vaping Use: Never used  Substance and Sexual Activity   Alcohol use: Yes    Comment: seldom   Drug use: No   Sexual activity: Not Currently  Other Topics Concern   Not on file  Social History Narrative   Not on file   Social Determinants of Health   Financial Resource Strain: Low Risk  (10/25/2021)   Overall Financial Resource Strain (CARDIA)    Difficulty of Paying Living Expenses: Not very hard  Food Insecurity: No Food Insecurity (10/25/2021)   Hunger Vital Sign    Worried About Running Out of Food in the Last Year: Never true     Ran Out of Food in the Last Year: Never true  Transportation Needs: No Transportation Needs (10/25/2021)   PRAPARE - Hydrologist (Medical): No    Lack of Transportation (Non-Medical): No  Physical Activity: Insufficiently Active (10/25/2021)   Exercise Vital Sign    Days of Exercise per Week: 1 day    Minutes of Exercise per Session: 20 min  Stress: Stress Concern Present (10/25/2021)   [place]    Feeling of Stress : To some extent  Social Connections: Unknown (10/25/2021)   Social Connection and Isolation Panel [NHANES]    Frequency of Communication with Friends and Family: Once a week    Frequency of Social Gatherings with Friends and Family: Patient refused    Attends Religious Services: Patient refused    Marine scientist or Organizations: No    Attends Archivist Meetings: Not on file    Marital Status: Married   Vitals:   06/22/22 1206  BP: 126/80  Pulse: (!) 106  Resp: 16  SpO2: 97%   Body mass index is 35.45 kg/m.  Physical Exam Vitals and nursing note reviewed.  Constitutional:      General: She is not in acute distress.    Appearance: She is well-developed.  HENT:     Head: Normocephalic and atraumatic.     Mouth/Throat:     Mouth: Mucous membranes are moist.     Pharynx: Oropharynx is clear.  Eyes:     Conjunctiva/sclera: Conjunctivae normal.  Cardiovascular:     Rate and Rhythm: Tachycardia present. Rhythm irregular.  Pulses:          Dorsalis pedis pulses are 2+ on the right side and 2+ on the left side.     Heart sounds: No murmur heard. Pulmonary:     Effort: Pulmonary effort is normal. No respiratory distress.     Breath sounds: Normal breath sounds.  Abdominal:     Palpations: Abdomen is soft. There is no hepatomegaly or mass.     Tenderness: There is no abdominal tenderness.  Lymphadenopathy:     Cervical: No cervical adenopathy.   Skin:    General: Skin is warm.     Findings: No erythema or rash.  Neurological:     General: No focal deficit present.     Mental Status: She is alert and oriented to person, place, and time.     Cranial Nerves: No cranial nerve deficit.     Gait: Gait normal.  Psychiatric:     Comments: Well groomed, good eye contact.   ASSESSMENT AND PLAN:  Ms.Doratha was seen today for hospitalization follow-up.  Diagnoses and all orders for this visit: Orders Placed This Encounter  Procedures   US Abdomen Limited RUQ (LIVER/GB)   Upper abdominal pain Improved. ? Dyspepsia,gall bladder disease among some to consider. I do not think imaging is needed at this time. Instructed about warning signs.  Serum lipase elevation Problem has been going on for over a year. Abdominal CT did not show signs of pancreatitis (acute or chronic) in 11/2020, at the time lipase was 80. Will obtain RUQ [place] to evaluate for gallbladder disease.  Stage 3a chronic kidney disease (HCC) Cr and e GFR have improved. Continue adequate hydration, low sat diet,and avoidance of NSAID's.  Chronic obstructive pulmonary disease, unspecified COPD type ([place]) Well controlled. Continue Albuterol inh 2 puff qid prn.  Chronic heart failure with preserved ejection fraction (HCC) Continue Metoprolol,Losartan,and Furosemide. BP today adequately controlled. PVC's present in previous EKG's. Following with cardiologist.  Return in about 5 weeks (around 07/27/2022).  Ardith Test G. [name], MD  [facility]. [place] office.

## 2022-06-22 ENCOUNTER — Encounter: Payer: Self-pay | Admitting: Family Medicine

## 2022-06-22 ENCOUNTER — Ambulatory Visit (INDEPENDENT_AMBULATORY_CARE_PROVIDER_SITE_OTHER): Payer: Medicare Other | Admitting: Family Medicine

## 2022-06-22 VITALS — BP 126/80 | HR 106 | Resp 16 | Ht 65.0 in | Wt 213.0 lb

## 2022-06-22 DIAGNOSIS — I5032 Chronic diastolic (congestive) heart failure: Secondary | ICD-10-CM | POA: Diagnosis not present

## 2022-06-22 DIAGNOSIS — J449 Chronic obstructive pulmonary disease, unspecified: Secondary | ICD-10-CM

## 2022-06-22 DIAGNOSIS — R1010 Upper abdominal pain, unspecified: Secondary | ICD-10-CM

## 2022-06-22 DIAGNOSIS — N1831 Chronic kidney disease, stage 3a: Secondary | ICD-10-CM

## 2022-06-22 DIAGNOSIS — I25118 Atherosclerotic heart disease of native coronary artery with other forms of angina pectoris: Secondary | ICD-10-CM | POA: Diagnosis not present

## 2022-06-22 DIAGNOSIS — R748 Abnormal levels of other serum enzymes: Secondary | ICD-10-CM | POA: Diagnosis not present

## 2022-06-22 NOTE — Patient Instructions (Addendum)
A few things to remember from today's visit:  Serum lipase elevation - Plan: US Abdomen Limited RUQ (LIVER/GB)  Chronic obstructive pulmonary disease, unspecified COPD type ([place]), Chronic  Chronic heart failure with preserved ejection fraction ([place]), Chronic  Upper abdominal pain - Plan: US Abdomen Limited RUQ (LIVER/GB)  If you need refills please call your pharmacy. Do not use My Chart to request refills or for acute issues that need immediate attention.   We will plan on repeating labs next visit. Increase fluid intake.  Please be sure medication list is accurate. If a new problem present, please set up appointment sooner than planned today.

## 2022-06-23 ENCOUNTER — Encounter: Payer: Self-pay | Admitting: Rehabilitative and Restorative Service Providers"

## 2022-06-23 ENCOUNTER — Ambulatory Visit: Payer: Medicare Other | Admitting: Rehabilitative and Restorative Service Providers"

## 2022-06-23 DIAGNOSIS — R42 Dizziness and giddiness: Secondary | ICD-10-CM | POA: Diagnosis not present

## 2022-06-23 DIAGNOSIS — R2681 Unsteadiness on feet: Secondary | ICD-10-CM

## 2022-06-23 DIAGNOSIS — M25511 Pain in right shoulder: Secondary | ICD-10-CM | POA: Diagnosis not present

## 2022-06-23 DIAGNOSIS — R296 Repeated falls: Secondary | ICD-10-CM

## 2022-06-23 DIAGNOSIS — R262 Difficulty in walking, not elsewhere classified: Secondary | ICD-10-CM

## 2022-06-23 DIAGNOSIS — G8929 Other chronic pain: Secondary | ICD-10-CM | POA: Diagnosis not present

## 2022-06-23 DIAGNOSIS — M6281 Muscle weakness (generalized): Secondary | ICD-10-CM

## 2022-06-23 NOTE — Therapy (Signed)
OUTPATIENT PHYSICAL THERAPY TREATMENT NOTE AND DISCHARGE SUMMARY   Patient Name: Tracy [name] MRN: [id number] DOB:June 14, 1946, 76 y.o., female Today's Date: 06/23/2022  PCP: [name], [name], MD  REFERRING PROVIDER: [name], [name], MD    END OF SESSION:   PT End of Session - 06/23/22 0934     Visit Number 26    Date for PT Re-Evaluation 06/24/22    Authorization Type Med A- KX modifier needed    Progress Note Due on Visit 30    PT Start Time 0930    PT Stop Time 1010    PT Time Calculation (min) 40 min    Activity Tolerance Patient tolerated treatment well    Behavior During Therapy [facility] for tasks assessed/performed                 Past Medical History:  Diagnosis Date   Allergic rhinitis    Allergy    Anxiety    Arthritis    Breast cancer ([place]) 04/28/2020   rigtht breast   Bronchitis    CAD (coronary artery disease)    1/19 PCI/DES to [place] for ISR, normal EF.    Cancer ([place])    hx of precancerous cells in right breast    Cataract    Cervical dysplasia    unsure of procedure, possible "burning" in her late 21s   CHF (congestive heart failure) ([place])    Echo 06/2019: EF 55-60, elevated LVEDP, normal RV SF, mild MAC, mild MR, trivial TR   Complication of anesthesia    COPD (chronic obstructive pulmonary disease) (HCC)    early    Depression    Dyspnea    with exertion    Family history of adverse reaction to anesthesia    daughter- problems wiht n/v   GERD (gastroesophageal reflux disease)    Gout    Headache(784.0)    Heart murmur    hx of years ago    Hyperlipidemia    Hypertension    Low back pain    Menopausal syndrome    Myocardial infarct ([place]) 2007   hx of   Overactive bladder    PONV (postoperative nausea and vomiting)    Sleep apnea    Past Surgical History:  Procedure Laterality Date   ANGIOPLASTY     stent 2007   BREAST LUMPECTOMY WITH RADIOACTIVE SEED LOCALIZATION Right 04/28/2020   Procedure: RIGHT BREAST LUMPECTOMY X 2   WITH RADIOACTIVE SEED LOCALIZATION;  Surgeon: Donnie Mesa, MD;  Location: [place];  Service: General;  Laterality: Right;  LMA   CARDIAC CATHETERIZATION     COLONOSCOPY     CORONARY STENT INTERVENTION N/A 11/06/2017   Procedure: CORONARY STENT INTERVENTION;  Surgeon: [name], MD;  Location: [place] [facility];  Service: Cardiovascular;  Laterality: N/A;   LEFT HEART CATH AND CORONARY ANGIOGRAPHY N/A 11/06/2017   Procedure: LEFT HEART CATH AND CORONARY ANGIOGRAPHY;  Surgeon: [name], MD;  Location: [place] [facility];  Service: Cardiovascular;  Laterality: N/A;   LEFT HEART CATH AND CORONARY ANGIOGRAPHY N/A 07/24/2019   Procedure: LEFT HEART CATH AND CORONARY ANGIOGRAPHY;  Surgeon: Burnell Blanks, MD;  Location: [place] [facility];  Service: Cardiovascular;  Laterality: N/A;   RIGHT/LEFT HEART CATH AND CORONARY ANGIOGRAPHY N/A 09/29/2020   Procedure: RIGHT/LEFT HEART CATH AND CORONARY ANGIOGRAPHY;  Surgeon: Belva Crome, MD;  Location: [place] [facility];  Service: Cardiovascular;  Laterality: N/A;   ROBOTIC ASSISTED BILATERAL SALPINGO OOPHERECTOMY  Bilateral 11/10/2020   Procedure: XI ROBOTIC ASSISTED BILATERAL SALPINGO OOPHORECTOMY WITH MINI LAPAROTOMY FOR DRAINAGE;  Surgeon: Lafonda Mosses, MD;  Location: [facility];  Service: Gynecology;  Laterality: Bilateral;  MINI LAP FIRST   TONSILLECTOMY     VULVECTOMY N/A 11/10/2020   Procedure: WIDE EXCISION VULVECTOMY;  Surgeon: Lafonda Mosses, MD;  Location: [facility];  Service: Gynecology;  Laterality: N/A;   Patient Active Problem List   Diagnosis Date Noted   Dizziness 01/19/2022   Frequent falls 01/19/2022   Tremor of both hands 08/16/2021   Aortic atherosclerosis ([place]) 06/15/2021   Generalized osteoarthritis of multiple sites 12/25/2020   Chronic pain disorder 12/25/2020   Pelvic mass in female    Bilateral lower extremity edema 04/13/2020   Shortness of breath    Anxiety disorder 10/29/2018    CKD (chronic kidney disease), stage III ([place]) 06/27/2018   COPD (chronic obstructive pulmonary disease) ([place]) 05/25/2018   Obesity 03/20/2018   Dyspnea on exertion 11/06/2017   Abnormal stress test 11/06/2017   Chronic diastolic CHF (congestive heart failure) ([place]) 10/03/2017   Chronic combined systolic and diastolic heart failure ([place]) 09/04/2017   Left ventricular dysfunction 07/31/2017   Cataract, nuclear sclerotic senile, bilateral 02/01/2017   Gouty arthritis of toe of left foot 08/09/2016   Hyperuricemia 08/09/2016   Osteoarthritis (arthritis due to wear and tear of joints) 01/14/2014   Coronary artery disease 08/02/2011   Mitral regurgitation 08/02/2011   OVERACTIVE BLADDER 07/02/2010   ACUTE CYSTITIS 05/25/2010   Pure hypercholesterolemia 08/11/2009   PEDAL EDEMA 07/22/2009   GERD 04/11/2008   Essential hypertension 05/30/2007   MYOCARDIAL INFARCTION, HX OF 05/30/2007   Allergic rhinitis 05/30/2007   LOW BACK PAIN 05/30/2007    REFERRING DIAG: M25.511,G89.29 (ICD-10-CM) - Chronic right shoulder pain   THERAPY DIAG:  Chronic right shoulder pain  Muscle weakness (generalized)  Unsteadiness on feet  Difficulty in walking, not elsewhere classified  Dizziness and giddiness  Repeated falls  Rationale for Evaluation and Treatment Rehabilitation  PERTINENT HISTORY: Dizziness, CVA, Hx of falls, OA  PRECAUTIONS: Fall Risk  SUBJECTIVE: Pt reports that she is feeling better than last week.  States that she still has some soreness over her right AC joint.  PAIN:  Are you having pain? Yes: NPRS scale: 1-3/10 Pain location: right shoulder Pain description: burning Aggravating factors: use Relieving factors: rest  PATIENT GOALS:  Pt would like to be able to move around better without pain.  OBJECTIVE: (objective measures completed at initial evaluation unless otherwise dated)  DIAGNOSTIC FINDINGS:  01/16/2022 CT of cervical spine:  1. No acute fracture or  traumatic subluxation of the cervical spine.  2. Stable 3 mm right upper lobe nodule. No follow-up needed if patient is low-risk.This recommendation follows the consensus statement: Guidelines for Management of Incidental Pulmonary Nodules Detected on CT Images: From the Fleischner Society 2017; Radiology 2017; 284:228-243.   PATIENT SURVEYS:  03/09/2022: FOTO 51% (projected 58% by visit 11) 04/12/2022:  FOTO 59% 05/31/2022:  FOTO 70%   COGNITION:           Overall cognitive status: Within functional limits for tasks assessed                                  SENSATION: Light touch: WFL   POSTURE: Forward head, rounded shoulders   UPPER EXTREMITY ROM:    Active ROM Right 03/09/22 Right 04/14/22 Right 04/28/22 Right  05/31/22 Left 03/09/22  Shoulder flexion 115 128 130 138 137  Shoulder extension         Shoulder abduction 116 125 127 138 125  Shoulder adduction         Shoulder internal rotation         Shoulder external rotation         Elbow flexion         Elbow extension         Wrist flexion         Wrist extension         Wrist ulnar deviation         Wrist radial deviation         Wrist pronation         Wrist supination         (Blank rows = not tested)   UPPER EXTREMITY MMT:   MMT Right 03/09/2022 Right 04/12/2022 Right  06/23/2022 Left 03/09/2022  Shoulder flexion 4 4+ 4+ 5  Shoulder extension        Shoulder abduction 4 4 4+ 5  Shoulder adduction        Shoulder internal rotation 4 4+ 4+ 5  Shoulder external rotation 4 4 4+ 4  Middle trapezius        Lower trapezius        Elbow flexion        Elbow extension        Wrist flexion        Wrist extension        Wrist ulnar deviation        Wrist radial deviation        Wrist pronation        Wrist supination        Grip strength (lbs)        (Blank rows = not tested)   SHOULDER SPECIAL TESTS:            Impingement tests: Hawkins/Kennedy impingement test: positive right            Rotator cuff  assessment: Empty can test: negative   JOINT MOBILITY TESTING:  Crepetis noted in right shoulder   PALPATION:  Tender to palpation along shoulder joint   FUNCTIONAL TESTING: 03/09/2022: 5 times sit to/from stand:  18.7 seconds with UE use TUG 19.3 sec with [facility]               03/29/22: 5x sit to stand: 23 seconds (pt reports dizziness today)    04/05/22: TUG 14.5 seconds   04/12/2022:   5 times sit to/from stand:  14.4 seconds with UE use TUG in 12.97 sec without assistive device  04/28/2022: 5 times sit to/from stand:  14.2 seconds with UE use  TODAY'S TREATMENT:   06/23/2022: Nustep level 5 x8 min with PT present to discuss status Seated with red theraband: shoulder ER, horizontal abduction.  BUE 2x10 Standing rows and extension with green tband 2x12 Shoulder flexion, bicep curls, shoulder abduction and chest press with 2# dumbbells 2x12 each bilat Wall push ups 2x10 Wall wash for flexion and abduction 2x10 RUE each  06/21/2022: Nustep level 5 x7 min with PT present to discuss status Seated with red theraband: shoulder ER, horizontal abduction.  BUE 2x10 Shoulder flexion, bicep curls, shoulder abduction and chest press with 2# dumbbells 2x12 each bilat Standing rows and extension with green tband 2x12 Wall wash for flexion and abduction 2x10 RUE each Wall push ups 2x10 Shoulder flexion  with serratus activation with green loop 2x10  06/16/2022: Nustep level 5 x6 min with PT present to discuss status Seated with red theraband: shoulder ER, horizontal abduction.  BUE 2x10 Shoulder flexion, bicep curls, shoulder abduction and chest press with 2# dumbbells 2x12 each bilat Standing rows and extension with green tband 2x12 Wall wash for flexion and abduction 2x10 RUE each Wall push ups 2x10 Shoulder flexion with serratus activation with green loop 2x10     PATIENT EDUCATION: Education details: Issued HEP and provided with yellow tband Person educated: Patient Education method:  Explanation, Demonstration, and Handouts Education comprehension: verbalized understanding and returned demonstration     HOME EXERCISE PROGRAM: Access Code: [email or web address] URL: [email or web address] Date: 06/02/2022 Prepared by: Shelby Dubin Aurelie Dicenzo  Exercises - Shoulder External Rotation and Scapular Retraction with Resistance  - 1-2 x daily - 7 x weekly - 2 sets - 10 reps - Standing Shoulder Horizontal Abduction with Resistance  - 1-2 x daily - 7 x weekly - 2 sets - 10 reps - Seated Scapular Retraction  - 1-2 x daily - 7 x weekly - 2 sets - 10 reps - Seated Upper Trapezius Stretch  - 1 x daily - 7 x weekly - 1 sets - 2 reps - 20 sec hold - Seated Levator Scapulae Stretch  - 1 x daily - 7 x weekly - 1 sets - 2 reps - 20 sec hold - Wall Push Up  - 1 x daily - 7 x weekly - 2 sets - 10 reps - Doorway Pec Stretch at 90 Degrees Abduction  - 1 x daily - 7 x weekly - 1 sets - 2 reps - 20 sec hold   ASSESSMENT:   CLINICAL IMPRESSION: Ms Mclaurin has made excellent progress since starting skilled PT.  Pt reports that she is not getting dizzy like she was before and states that she can also put away dishes without increased pain.  Pt has met all goals at this time and is discharged from skilled PT at this time to continue with HEP.  Pt to continue to follow up with MD as recommended.     OBJECTIVE IMPAIRMENTS decreased balance, difficulty walking, decreased ROM, decreased strength, dizziness, increased muscle spasms, impaired UE functional use, postural dysfunction, and pain.    ACTIVITY LIMITATIONS carrying and lifting   PARTICIPATION LIMITATIONS: cleaning and driving   PERSONAL FACTORS Time since onset of injury/illness/exacerbation and 3+ comorbidities: COPD, CHF, HTN, OA  are also affecting patient's functional outcome.    REHAB POTENTIAL: Good   CLINICAL DECISION MAKING: Evolving/moderate complexity   EVALUATION COMPLEXITY: Moderate     GOALS: Goals reviewed with patient? Yes    SHORT TERM GOALS: Target date: 03/30/2022     Pt will be independent with initial HEP. Baseline: Goal status: Goal Met 03/25/2022   2.  Pt will report at least a 40% improvement in symptoms since starting PT. Baseline: 40-45%  (04/05/22) Goal status: MET     LONG TERM GOALS: Target date: 06/24/2022     Pt will be independent with advanced HEP. Baseline:  Goal status: Goal Met 06/21/2022   2.  Pt will increase FOTO to 58% to demonstrate her improvements in functional mobility. Baseline: 51% Goal status: Goal Met on 04/12/2022   3.  Pt will report being able to fasten/unfasten her bra without increased pain. Baseline: burning pain (04/05/22) Goal status: Goal Met on 05/31/2022   4.  Pt will decrease time on TUG to 14 seconds or less without assistive  device and good safety to decrease risk of falling. Baseline: 14.5 seconds (04/05/22) Goal status: Goal Met 04/12/2022 (12.97 sec)   5.  Pt will increase right shoulder strength to at least 4+/5 to allow her to carry groceries without difficulty. Baseline:  Goal status: Goal met 06/23/2022   6.  Pt will state no dizziness with functional tasks, including reaching during grocery shopping for at least the week prior to discharge. Baseline: Pt reports occasionally having dizziness and losing balance (04/28/22) Goal status: Goal Met 06/23/2022 (denies dizziness)  7.  Pt will be able to put away dishes in her home with pain no greater than 2/10.  Baseline:  Pain 4/10  Goal status:  Goal Met 06/23/2022     PLAN: PT FREQUENCY: 2x/week   PT DURATION: 8 weeks   PLANNED INTERVENTIONS: Therapeutic exercises, Therapeutic activity, Neuromuscular re-education, Balance training, Gait training, Patient/Family education, Joint manipulation, Joint mobilization, Stair training, Vestibular training, Aquatic Therapy, Dry Needling, Electrical stimulation, Spinal manipulation, Spinal mobilization, Cryotherapy, Moist heat, Taping, Vasopneumatic device, Traction,  Ultrasound, Ionotophoresis 31m/ml Dexamethasone, Manual therapy, and Re-evaluation   PLAN FOR NEXT SESSION:  Rt shoulder and scapular strength, balance as indicated   PHYSICAL THERAPY DISCHARGE SUMMARY   Patient agrees to discharge. Patient goals were met. Patient is being discharged due to meeting the stated rehab goals.     SJuel Burrow PT 06/23/22 10:15 AM   [facility] [address] [place]St[place] [address]Phone # [phone] [phone]

## 2022-06-26 ENCOUNTER — Other Ambulatory Visit: Payer: Self-pay | Admitting: Cardiovascular Disease

## 2022-06-28 IMAGING — CT CT ABD-PELV W/ CM
2 of 5 series · 16 of 46 positions shown, 18 images · IV contrast (iopamidol)
Comparison: None.

CLINICAL DATA: Pelvic mass.

EXAM:
CT ABDOMEN AND PELVIS WITH CONTRAST
TECHNIQUE: Multidetector CT imaging of the abdomen and pelvis was performed
using the standard protocol following bolus administration of
intravenous contrast.
CONTRAST:  125mL AUBQPN-IHB IOPAMIDOL (AUBQPN-IHB) INJECTION 76%

[Series 2: abd pelvis 5.00 br40 s3 axial · axial · 0.98mm/px · z∈[+1207,+1607]mm · 13 of 94 slices shown, 15 images]
[im 7/94  soft-tissue]
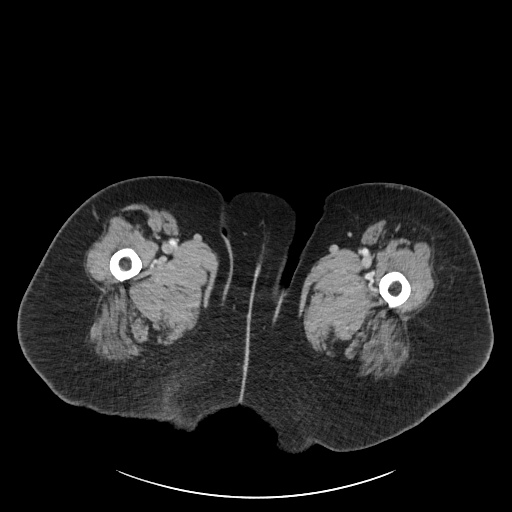
[im 7/94  bone]
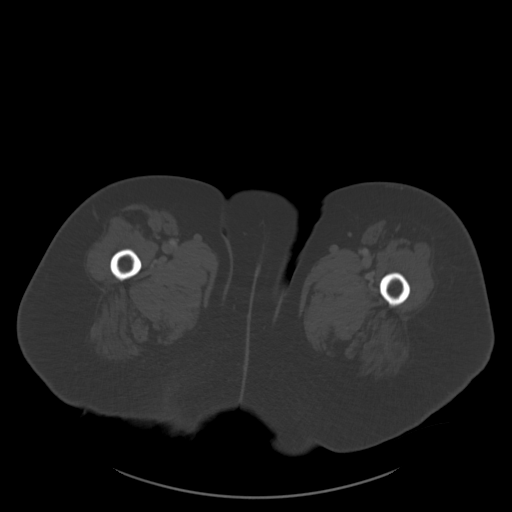
[im 13/94  soft-tissue]
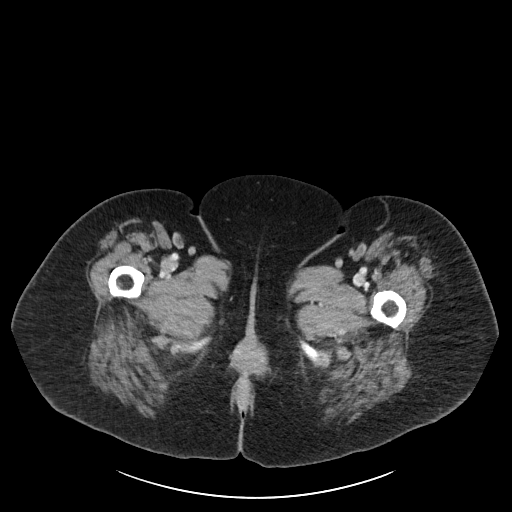
[im 19/94  soft-tissue]
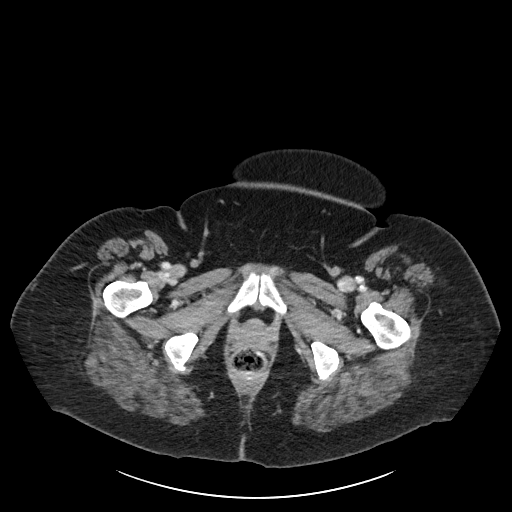
[im 25/94  soft-tissue]
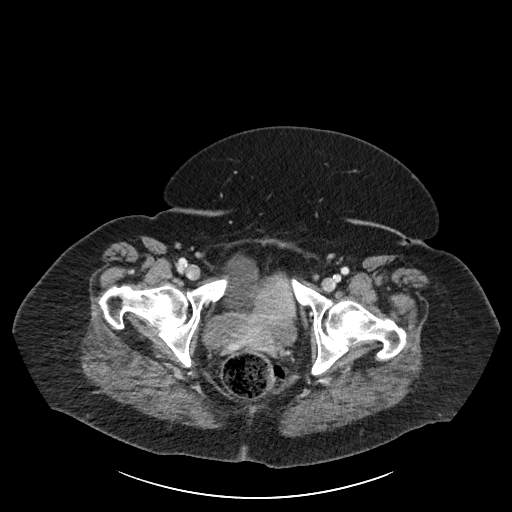
[im 32/94  soft-tissue]
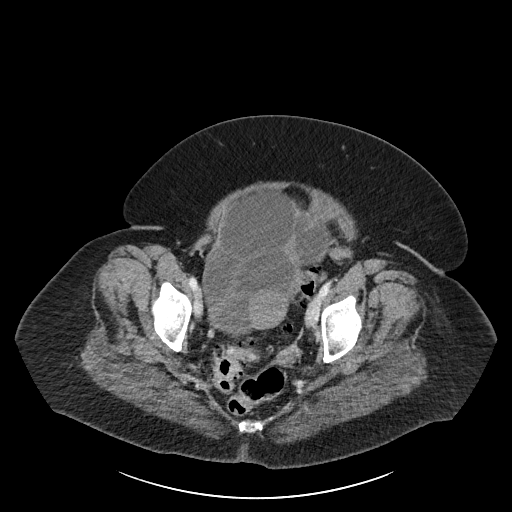
[im 38/94  soft-tissue]
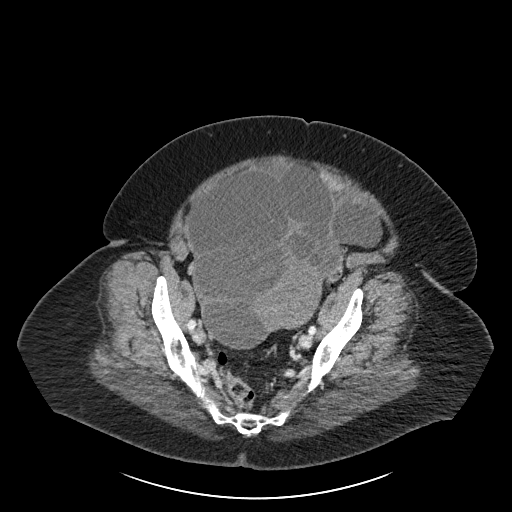
[im 50/94  soft-tissue]
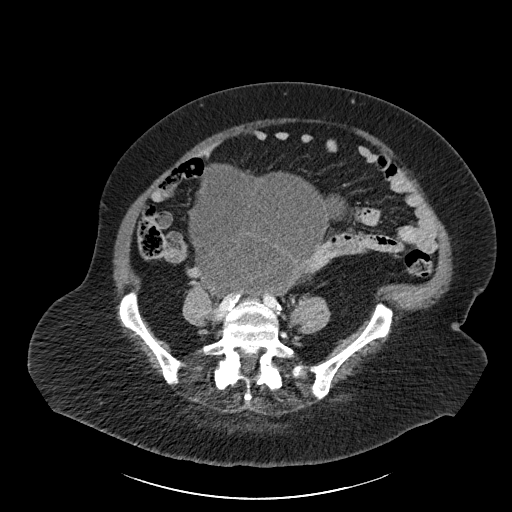
[im 56/94  soft-tissue]
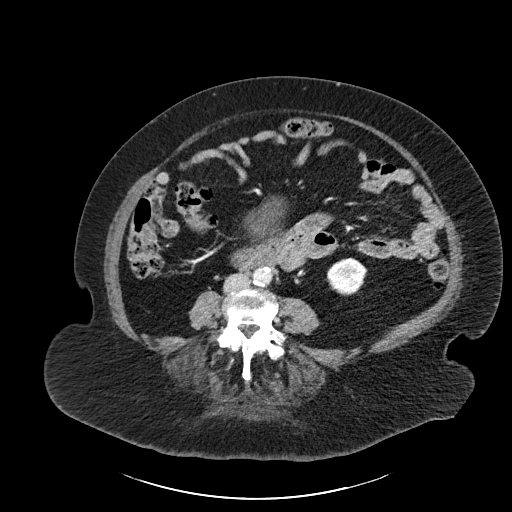
[im 63/94  soft-tissue]
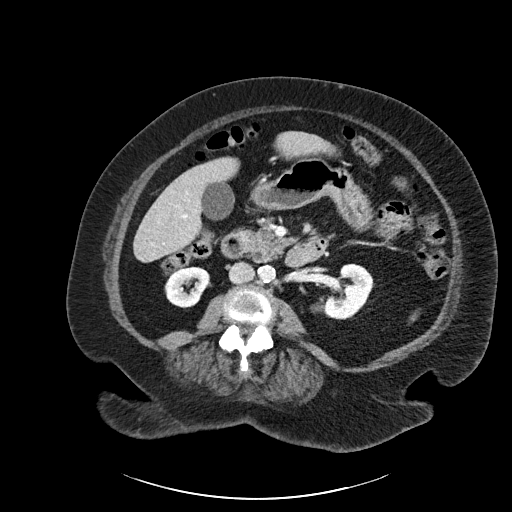
[im 63/94  bone]
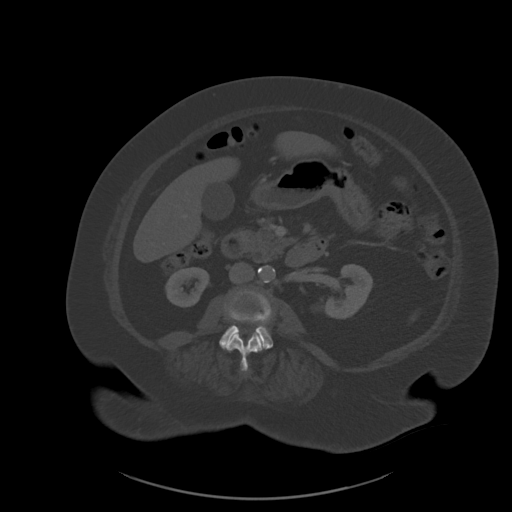
[im 69/94  soft-tissue]
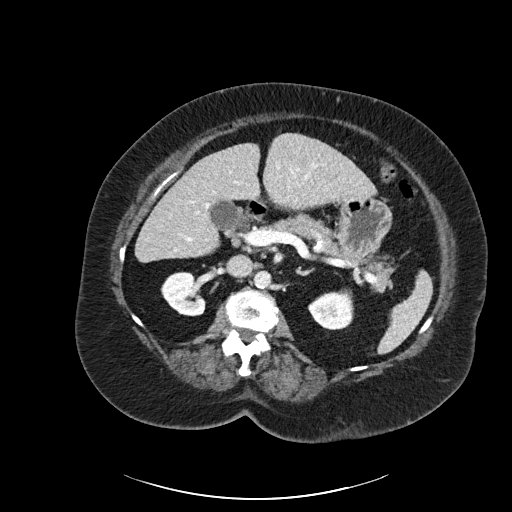
[im 75/94  soft-tissue]
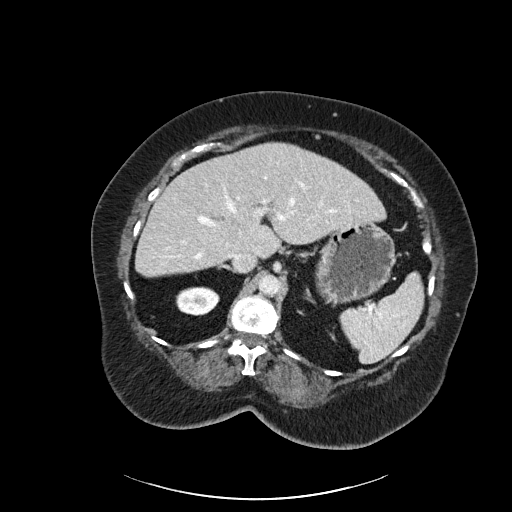
[im 81/94  soft-tissue]
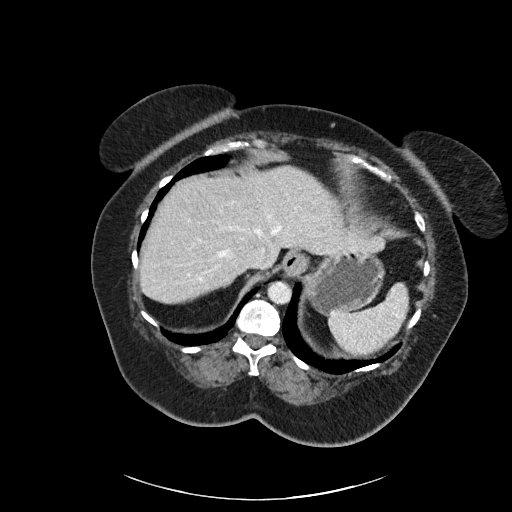
[im 87/94  soft-tissue]
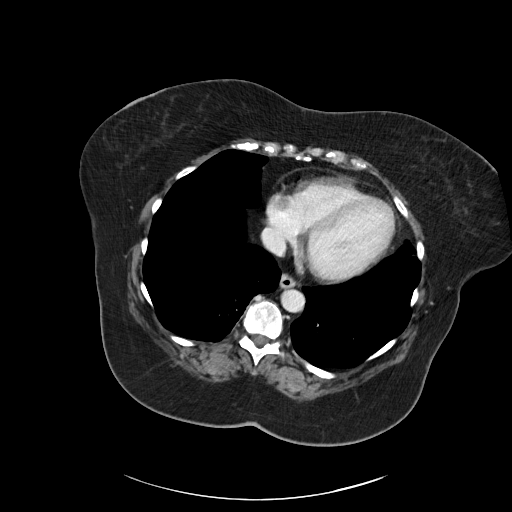

[Series 6: abd pelvis 2.00 br40 s3 cor · coronal · 0.92mm/px · 3 of 249 slices shown]
[im 83/249  soft-tissue]
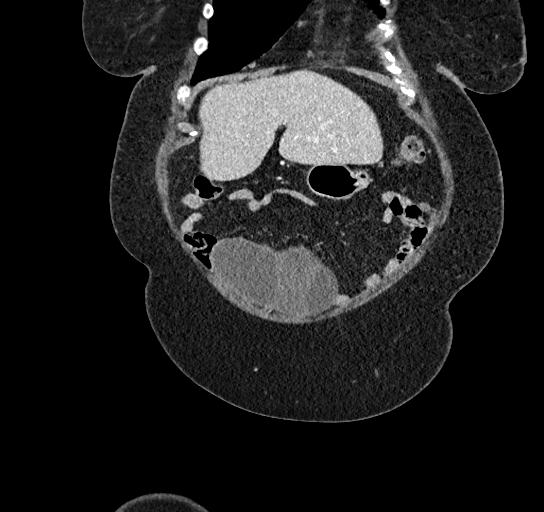
[im 111/249  soft-tissue]
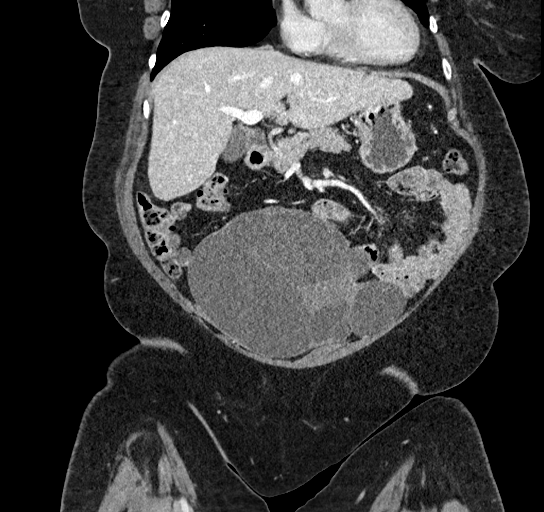
[im 138/249  soft-tissue]
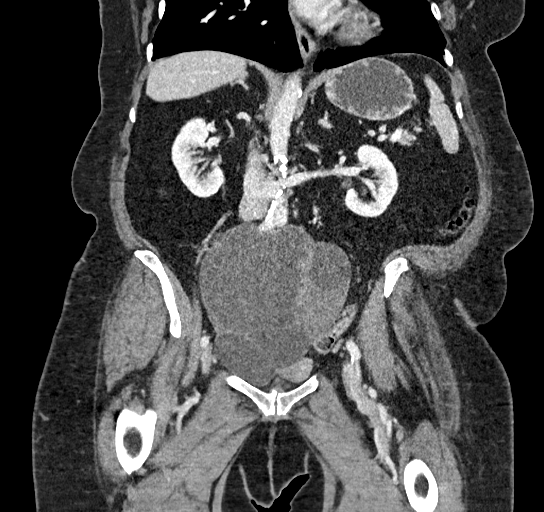

[16 of 46 positions shown; findings below may reference images not displayed]

FINDINGS: Lower chest: Unremarkable.

Hepatobiliary: No suspicious focal abnormality within the liver
parenchyma. There is no evidence for gallstones, gallbladder wall
thickening, or pericholecystic fluid. No intrahepatic or
extrahepatic biliary dilation.

Pancreas: No focal mass lesion. No dilatation of the main duct. No
intraparenchymal cyst. No peripancreatic edema.

Spleen: No splenomegaly. No focal mass lesion.

Adrenals/Urinary Tract: No adrenal nodule or mass. Kidneys
unremarkable. No evidence for hydroureter. The urinary bladder
appears normal for the degree of distention.

Stomach/Bowel: Stomach is unremarkable. No gastric wall thickening.
No evidence of outlet obstruction. Duodenum is normally positioned
as is the ligament of Treitz. No small bowel wall thickening. No
small bowel dilatation. The terminal ileum is normal. The appendix
is not visualized, but there is no edema or inflammation in the
region of the cecum. No gross colonic mass. No colonic wall
thickening. Diverticular changes are noted in the left colon without
evidence of diverticulitis.

Vascular/Lymphatic: There is abdominal aortic atherosclerosis
without aneurysm. Upper normal lymph nodes seen in the
hepatoduodenal ligament. No retroperitoneal lymphadenopathy. No
pelvic sidewall lymphadenopathy.

Reproductive: The uterus is distorted by a large pelvic mass.
Neither ovary can be discretely identified.

Other: 18.3 x 12.6 x 19.5 cm complex multicystic lesion with solid
components identified in the central pelvis displacing the uterus
and bladder inferiorly and essentially filling the anterior pelvis.
Lesion extends further down into the right adnexal space than the
left. Origin/etiology of this mass lesion not discernible by CT. No
associated ascites.

Musculoskeletal: No worrisome lytic or sclerotic osseous
abnormality.
IMPRESSION: 1. 18.3 x 12.6 x 19.5 cm complex cystic and solid mass lesion in the
central pelvis displaces the uterus and bladder inferiorly and
essentially fills anterior pelvis. While potentially ovarian, the
origin/etiology of the mass is not discernible by CT. No associated
ascites.
2. No evidence for lymphadenopathy in the abdomen or pelvis.
3. Left colonic diverticulosis without diverticulitis.
4. Aortic Atherosclerosis ([id number]).

## 2022-07-01 DIAGNOSIS — M25511 Pain in right shoulder: Secondary | ICD-10-CM | POA: Diagnosis not present

## 2022-07-01 DIAGNOSIS — M19011 Primary osteoarthritis, right shoulder: Secondary | ICD-10-CM | POA: Diagnosis not present

## 2022-07-11 ENCOUNTER — Other Ambulatory Visit: Payer: Self-pay | Admitting: Family Medicine

## 2022-07-11 DIAGNOSIS — G8929 Other chronic pain: Secondary | ICD-10-CM

## 2022-07-14 ENCOUNTER — Other Ambulatory Visit: Payer: Self-pay | Admitting: Family Medicine

## 2022-07-14 DIAGNOSIS — M25511 Pain in right shoulder: Secondary | ICD-10-CM | POA: Diagnosis not present

## 2022-07-21 ENCOUNTER — Ambulatory Visit (INDEPENDENT_AMBULATORY_CARE_PROVIDER_SITE_OTHER): Payer: Medicare Other

## 2022-07-21 DIAGNOSIS — Z23 Encounter for immunization: Secondary | ICD-10-CM

## 2022-07-22 DIAGNOSIS — M19011 Primary osteoarthritis, right shoulder: Secondary | ICD-10-CM | POA: Diagnosis not present

## 2022-07-26 NOTE — Progress Notes (Signed)
HPI: [name] is a 76 y.o. female with medical history significant for CAD, hypertension, hyperlipidemia, generalized OA, MR, COPD, CKD 3, and anxiety here today to follow on recent visit. Last visit we discussed upper abdominal pain and elevated lipase for about a year, liver US was order. She reports that she has not yet received appt for ultrasound. Right shoulder pain has improved some with PT. She mentions having an MRI on her shoulder, which revealed a tear in her rotator cuff.However,she was told that the tear is not severe enough to require surgery.She is   Upper abdominal pain has improved. She describes the discomfort as a tightness, similar to wearing a belt that is too tight. She denies any current nausea or vomiting.  She also reports experiencing pulsating sensations in her ears, which has been a recurring issue for a while, stable. No changes in hearing. Carotid US 04/2021: Right Carotid: Velocities in the right ICA are consistent with a 1-39% stenosis.  Left Carotid: Velocities in the left ICA are consistent with a 1-39% stenosis.   She denies any recent falls and states that her blood pressure is well-controlled.   She mentions having some difficulty breathing sometimes, which she considers normal for her.   -C/O swelling and pain in her legs, particularly in her left leg, feels warm. She has not taken her Furosemide today. She is on Furosemide 40 mg daily for LE edema. Negative for CP,orthopnea,PND,or palpitations.  Tracy [name] has received her flu shot but has not yet received the RSV vaccine or the latest COVID-19 vaccine.She wonders if she needs to take new vaccines.   Review of Systems  Constitutional:  Positive for fatigue. Negative for activity change, appetite change and fever.  HENT:  Negative for mouth sores, nosebleeds and sore throat.   Eyes:  Negative for redness and visual disturbance.  Respiratory:  Negative for cough and wheezing.    Gastrointestinal:  Negative for nausea and vomiting.       Negative for changes in bowel habits.  Genitourinary:  Negative for decreased urine volume, dysuria and hematuria.  Musculoskeletal:  Positive for arthralgias and gait problem.  Neurological:  Negative for syncope, weakness and headaches.  Psychiatric/Behavioral:  The patient is nervous/anxious.   Rest see pertinent positives and negatives per HPI.  Current Outpatient Medications on File Prior to Visit  Medication Sig Dispense Refill   acetaminophen (TYLENOL) 500 MG tablet Take 500-1,000 mg by mouth every 6 (six) hours as needed for headache.      albuterol (VENTOLIN HFA) 108 (90 Base) MCG/ACT inhaler Inhale 2 puffs into the lungs every 4 (four) hours as needed for wheezing. (Patient taking differently: Inhale 2 puffs into the lungs as needed for wheezing.) 1 each 3   allopurinol (ZYLOPRIM) 100 MG tablet TAKE 1 TABLET BY MOUTH EVERY DAY 90 tablet 2   amLODipine (NORVASC) 5 MG tablet TAKE 1 TABLET (5 MG TOTAL) BY MOUTH DAILY. 90 tablet 3   APPLE CIDER VINEGAR PO Take 2,400 mg by mouth daily.     ASPERCREME LIDOCAINE EX Apply 1 application topically daily as needed (pain).     aspirin EC 81 MG tablet Take 81 mg by mouth daily.     Cholecalciferol (VITAMIN D3) 50 MCG (2000 UT) TABS Take 4,000 Units by mouth daily.      clopidogrel (PLAVIX) 75 MG tablet TAKE 1 TABLET BY MOUTH EVERY DAY WITH BREAKFAST 90 tablet 2   Cyanocobalamin (VITAMIN B-12) 5000 MCG SUBL Place 5,000 mcg  under the tongue daily.      diclofenac Sodium (VOLTAREN) 1 % GEL Apply 2 g topically 4 (four) times daily as needed. 100 g 2   ezetimibe (ZETIA) 10 MG tablet TAKE ONE TABLET BY MOUTH DAILY 90 tablet 3   fluticasone (FLONASE) 50 MCG/ACT nasal spray SPRAY 2 SPRAYS INTO EACH NOSTRIL EVERY DAY (Patient taking differently: Place 2 sprays into both nostrils daily.) 48 mL 1   furosemide (LASIX) 40 MG tablet TAKE 1 TABLET BY MOUTH EVERY DAY 90 tablet 3   guaiFENesin  (MUCINEX) 600 MG 12 hr tablet Take 600 mg by mouth 2 (two) times daily as needed (congestion).     isosorbide mononitrate (IMDUR) 30 MG 24 hr tablet TAKE 1 TABLET BY MOUTH EVERY DAY 90 tablet 3   KLOR-CON M20 20 MEQ tablet TAKE 1 TABLET BY MOUTH TWICE A DAY 180 tablet 1   losartan (COZAAR) 100 MG tablet TAKE 1 TABLET BY MOUTH EVERY DAY 90 tablet 3   metoprolol succinate (TOPROL-XL) 100 MG 24 hr tablet TAKE 1 TABLET BY MOUTH DAILY. TAKE WITH OR IMMEDIATELY FOLLOWING A MEAL. 90 tablet 3   Misc Natural Products (TART CHERRY ADVANCED) CAPS Take 1,000 capsules by mouth daily.      nitroGLYCERIN (NITROSTAT) 0.4 MG SL tablet PLACE 1 TABLET UNDER THE TONGUE EVERY 5 MINUTES AS NEEDED FOR CHEST PAIN 25 tablet 6   oxymetazoline (AFRIN) 0.05 % nasal spray Place 1 spray into both nostrils 2 (two) times daily as needed for congestion.     pantoprazole (PROTONIX) 40 MG tablet TAKE 1 TABLET BY MOUTH EVERY DAY 90 tablet 2   pravastatin (PRAVACHOL) 40 MG tablet TAKE 1 TABLET BY MOUTH EVERY DAY IN THE EVENING 90 tablet 3   Simethicone (GAS-X PO) Take 2 tablets by mouth daily as needed (gas).     triamcinolone cream (KENALOG) 0.1 % APPLY TO AFFECTED AREA TWICE A DAY 30 g 1   Current Facility-Administered Medications on File Prior to Visit  Medication Dose Route Frequency Provider Last Rate Last Admin   0.9 %  sodium chloride infusion  500 mL Intravenous Once Irene Shipper, MD       Past Medical History:  Diagnosis Date   Allergic rhinitis    Allergy    Anxiety    Arthritis    Breast cancer ([place]) 04/28/2020   rigtht breast   Bronchitis    CAD (coronary artery disease)    1/19 PCI/DES to [facility] for ISR, normal EF.    Cancer ([place])    hx of precancerous cells in right breast    Cataract    Cervical dysplasia    unsure of procedure, possible "burning" in her late 46s   CHF (congestive heart failure) ([place])    Echo 06/2019: EF 55-60, elevated LVEDP, normal RV SF, mild MAC, mild MR, trivial TR   Complication of  anesthesia    COPD (chronic obstructive pulmonary disease) ([facility])    early    Depression    Dyspnea    with exertion    Family history of adverse reaction to anesthesia    daughter- problems wiht n/v   GERD (gastroesophageal reflux disease)    Gout    Headache(784.0)    Heart murmur    hx of years ago    Hyperlipidemia    Hypertension    Low back pain    Menopausal syndrome    Myocardial infarct ([place]) 2007   hx of   Overactive bladder  PONV (postoperative nausea and vomiting)    Sleep apnea    Allergies  Allergen Reactions   Erythromycin Rash    But isn't certain   Sulfamethoxazole Rash    Social History   Socioeconomic History   Marital status: Married    Spouse name: Not on file   Number of children: 2   Years of education: Not on file   Highest education level: GED or equivalent  Occupational History   Occupation: retired   Occupation: retired    Comment: Scientist, physiological  Tobacco Use   Smoking status: Former    Packs/day: 1.25    Years: 52.00    Total pack years: 65.00    Types: Cigarettes    Quit date: 2018    Years since quitting: 5.7   Smokeless tobacco: Never   Tobacco comments:    completely quit May of 2018; period of years she did not smoke   Vaping Use   Vaping Use: Never used  Substance and Sexual Activity   Alcohol use: Yes    Comment: seldom   Drug use: No   Sexual activity: Not Currently  Other Topics Concern   Not on file  Social History Narrative   Not on file   Social Determinants of Health   Financial Resource Strain: Low Risk  (10/25/2021)   Overall Financial Resource Strain (CARDIA)    Difficulty of Paying Living Expenses: Not very hard  Food Insecurity: No Food Insecurity (10/25/2021)   Hunger Vital Sign    Worried About Running Out of Food in the Last Year: Never true    [place] in the Last Year: Never true  Transportation Needs: No Transportation Needs (10/25/2021)   PRAPARE - Hydrologist  (Medical): No    Lack of Transportation (Non-Medical): No  Physical Activity: Insufficiently Active (10/25/2021)   Exercise Vital Sign    Days of Exercise per Week: 1 day    Minutes of Exercise per Session: 20 min  Stress: Stress Concern Present (10/25/2021)   [place]    Feeling of Stress : To some extent  Social Connections: Unknown (10/25/2021)   Social Connection and Isolation Panel [NHANES]    Frequency of Communication with Friends and Family: Once a week    Frequency of Social Gatherings with Friends and Family: Patient refused    Attends Religious Services: Patient refused    Marine scientist or Organizations: No    Attends Archivist Meetings: Not on file    Marital Status: Married   Vitals:   07/27/22 1510  BP: 120/70  Pulse: 100  Resp: 16  SpO2: 95%   Body mass index is 36.28 kg/m.  Physical Exam Vitals and nursing note reviewed.  Constitutional:      General: She is not in acute distress.    Appearance: She is well-developed.  HENT:     Head: Normocephalic and atraumatic.     Mouth/Throat:     Mouth: Mucous membranes are moist.     Pharynx: Oropharynx is clear.  Eyes:     Conjunctiva/sclera: Conjunctivae normal.  Cardiovascular:     Rate and Rhythm: Normal rate and regular rhythm.     Pulses:          Dorsalis pedis pulses are 2+ on the right side and 2+ on the left side.     Heart sounds: No murmur heard.    Comments: L>R  pitting LE edema. Left pretibial area with hyperpigmentation changes, no erythema, mild tenderness with palpation. Small vesicular lesion on medial aspect , above malleolus.No ulcers. Pulmonary:     Effort: Pulmonary effort is normal. No respiratory distress.     Breath sounds: Normal breath sounds.  Abdominal:     Palpations: Abdomen is soft. There is no hepatomegaly or mass.     Tenderness: There is no abdominal tenderness.  Musculoskeletal:     Right  lower leg: 1+ Pitting Edema present.     Left lower leg: 1+ Pitting Edema present.  Lymphadenopathy:     Cervical: No cervical adenopathy.  Skin:    General: Skin is warm.     Findings: No erythema or rash.  Neurological:     General: No focal deficit present.     Mental Status: She is alert and oriented to person, place, and time.     Cranial Nerves: No cranial nerve deficit.     Comments: Unstable gait assisted with a cane.  Psychiatric:        Mood and Affect: Affect normal. Mood is anxious.   ASSESSMENT AND PLAN:  [name] was seen today for follow-up.  Diagnoses and all orders for this visit:  Upper abdominal pain Problem has improved. Pending RUQ [place]. Abdominal/Pelvis CT done 11/2020 showed pancreas with no focal lesion.  Normal pancreatic contour.  No surrounding inflammatory changes.  No main pancreatic ductal dilation.  More hepatobiliary system.  At this time her lipase was also elevated. Instructed about warning signs.  Venous stasis dermatitis of left lower extremity We discussed differential diagnosis, I do not think there is ongoing infectious process. Appropriately skin care discussed. She has not tolerated compression stockings in the past and she is not interested in trying again. Lower extremity elevation a few times throughout the day. Monitor for signs of infection.  Pulsatile tinnitus of both ears Chronic. Carotid US negative. Brain MRI in 11/2021 showed mild chronic small vessel ischemic changes in the pons and cerebellum, old lacunar infarction left caudate body. Continue monitoring for symptoms. We could arrange ENT evaluation, she prefers to hold on this.  Shortness of breath Chronic. Some of her chronic comorbidities could be contributing factors. Echo in 06/2019 with LVEF 55 to 31% and diastolic dysfunction. Last visit with cardiologist 12/2021. Instructed about warning signs.  Recommend COVID 19 and RSV vaccination.  Return in about 4  months (around 11/27/2022).  Shondra Capps G. [name], MD  [facility]. [place] office.

## 2022-07-27 ENCOUNTER — Ambulatory Visit (INDEPENDENT_AMBULATORY_CARE_PROVIDER_SITE_OTHER): Payer: Medicare Other | Admitting: Family Medicine

## 2022-07-27 ENCOUNTER — Encounter: Payer: Self-pay | Admitting: Family Medicine

## 2022-07-27 VITALS — BP 120/70 | HR 100 | Resp 16 | Ht 65.0 in | Wt 218.0 lb

## 2022-07-27 DIAGNOSIS — I872 Venous insufficiency (chronic) (peripheral): Secondary | ICD-10-CM

## 2022-07-27 DIAGNOSIS — R1010 Upper abdominal pain, unspecified: Secondary | ICD-10-CM

## 2022-07-27 DIAGNOSIS — H93A3 Pulsatile tinnitus, bilateral: Secondary | ICD-10-CM | POA: Diagnosis not present

## 2022-07-27 DIAGNOSIS — I25118 Atherosclerotic heart disease of native coronary artery with other forms of angina pectoris: Secondary | ICD-10-CM

## 2022-07-27 DIAGNOSIS — R0602 Shortness of breath: Secondary | ICD-10-CM | POA: Diagnosis not present

## 2022-07-27 NOTE — Patient Instructions (Addendum)
A few things to remember from today's visit:  Upper abdominal pain  Venous stasis dermatitis of left lower extremity  Continue appropriate skin care. Lower extremity elevation a few times during the day. No changes today. Pending liver ultrasound.  If you need refills for medications you take chronically, please call your pharmacy. Do not use My Chart to request refills or for acute issues that need immediate attention. If you send a my chart message, it may take a few days to be addressed, specially if I am not in the office.  Please be sure medication list is accurate. If a new problem present, please set up appointment sooner than planned today.

## 2022-08-23 NOTE — Progress Notes (Unsigned)
Cardiology Office Note:    Date:  08/24/2022   ID:  Tracy [name], DOB Sep 23, 1946, MRN [id number]  PCP:  [name], [name], [place] Providers Cardiologist:  Sherren Mocha, MD Cardiology APP:  Sharmon Revere    Referring MD: [name], [name], MD   Chief Complaint:  F/u for CAD, CHF    Patient Profile: Coronary artery disease  S/p BMS to LCx in 2007; S/p DES to LCx in 10/2017 Myoview 07/2019: ant ischemia; high risk >> Cath 07/2019: patent LCx, stable anatomy - Med Rx Cath 09/29/20: oLAD 30, mLAD 60/70, oD1 62, 1st Sept Perf 100 (R-L collats); oLCx 25, pLCx stent ok w 10 ISR, pRCA 25, EF 50-55 >> Med Rx  Heart failure with preserved ejection fraction Echo 12/18: EF 50-55, Gr 1 DD Echo 07/17/19: EF 55-60, GR 1 DD, NL RVSF, mild MR, trivial TR, NL PASP, RAP 3 Chronic kidney disease 3 Chronic Obstructive Pulmonary Disease  Hypertension  Hyperlipidemia  Intol of statins (myalgias) // Elevated LFTs >> Statin DCd (Lipitor) Eval w [facility] (Dr. Debara Pickett) >> +Pravastatin w improved LDL Carotid artery stenosis  [place] 04/21/21: bilat ICA 1-39  Hepatic steatosis  Aortic atherosclerosis (CT 11/2020) S/p BSO, mini-lap for drainage of contained cyst (10/2020) Hx of CVA (remote lacunar infarct noted on MRI)    Cardiac Studies & Procedures   CARDIAC CATHETERIZATION  CARDIAC CATHETERIZATION 09/29/2020  Narrative  Right dominant coronary anatomy with no noted obstructive disease in the RCA.  Patent relatively short left main.  Large tortuous and angulated LAD with segmental 50 to 60% proximal to mid narrowing and tandem mid to distal 70% stenoses after the large first diagonal.  The diagonal contains 50% proximal narrowing.  The vessel is tortuous and heavily calcified.  Circumflex contains a long, possibly overlapping stented segment that is widely patent.  The vessel is heavily calcified.  Mid range LVEF estimated at 50% with EDP 14 m  mercury.  Mild pulmonary hypertension with mean pressure 24 mmHg.  Capillary wedge pressure mean is 15 mmHg.  Pulmonary vascular resistance is 1.42 Wood units.  The minimal elevation in pressure is suggested to be WHO group 3 or 5.  RECOMMENDATIONS:   Per treating team.  Findings Coronary Findings Diagnostic  Dominance: Right  Left Main Vessel is large.  Left Anterior Descending Vessel is large. The vessel is moderately tortuous. Overall appearance is not significantly different than prior studies in 2011 and 2012. Ost LAD lesion is 30% stenosed. The lesion is eccentric. The lesion is calcified. Prox LAD lesion is 50% stenosed. The lesion is eccentric. The lesion is moderately calcified. Mid LAD-1 lesion is 60% stenosed. Mid LAD-2 lesion is 70% stenosed.  First Diagonal Branch Vessel is small in size. Ost 1st Diag lesion is 40% stenosed.  First Septal Branch Collaterals 1st Sept filled by collaterals from Inf Sept.  Ost 1st Sept lesion is 100% stenosed.  Second Diagonal Branch Vessel is large in size.  Third Diagonal Branch Vessel is small in size.  Left Circumflex Vessel is large. Ost Cx lesion is 25% stenosed. Prox Cx to Mid Cx lesion is 10% stenosed. The lesion was previously treated using a drug eluting stent between 1-2 years ago.  First Obtuse Marginal Branch Vessel is small in size.  Second Obtuse Marginal Branch Vessel is small in size.  Third Obtuse Marginal Branch Vessel is large in size.  Right Coronary Artery Vessel is large. The vessel is  mildly tortuous. Prox RCA to Mid RCA lesion is 25% stenosed. The lesion is eccentric. The lesion is calcified.  Intervention  No interventions have been documented.   CARDIAC CATHETERIZATION  CARDIAC CATHETERIZATION 07/24/2019  Narrative  Ost LAD lesion is 30% stenosed.  Prox LAD to Mid LAD lesion is 50% stenosed.  Mid LAD lesion is 60% stenosed.  Mid LAD to Dist LAD lesion is 40% stenosed.  Ost  Cx lesion is 25% stenosed.  Prox RCA to Mid RCA lesion is 25% stenosed.  Prox Cx to Mid Cx lesion is 10% stenosed.  1. The left main is patent with no obstructive disease. 2. The LAD is a large caliber vessel that courses to the apex. The mid LAD has moderate disease that is unchanged from the catheterization in 2019. The lesions in the mid LAD do not appear to be flow limiting. 3. The stent in the mid Circumflex artery is patent with minimal restenosis 4. The RCA is a large dominant vessel with mild proximal to mid stenosis.  Recommendations: Continue medical management of CAD  Findings Coronary Findings Diagnostic  Dominance: Right  Left Main Vessel is large.  Left Anterior Descending Vessel is large. The vessel is moderately tortuous. Overall appearance is not significantly different than prior studies in 2011 and 2012. Ost LAD lesion is 30% stenosed. The lesion is eccentric. The lesion is calcified. Prox LAD to Mid LAD lesion is 50% stenosed. The lesion is eccentric. The lesion is moderately calcified. Mid LAD lesion is 60% stenosed. Mid LAD to Dist LAD lesion is 40% stenosed.  First Diagonal Branch Vessel is small in size.  Second Diagonal Branch Vessel is large in size.  Third Diagonal Branch Vessel is small in size.  Left Circumflex Vessel is large. Ost Cx lesion is 25% stenosed. Prox Cx to Mid Cx lesion is 10% stenosed. The lesion was previously treated using a drug eluting stent between 1-2 years ago.  First Obtuse Marginal Branch Vessel is small in size.  Second Obtuse Marginal Branch Vessel is small in size.  Third Obtuse Marginal Branch Vessel is large in size.  Right Coronary Artery Vessel is large. The vessel is mildly tortuous. Prox RCA to Mid RCA lesion is 25% stenosed. The lesion is eccentric. The lesion is calcified.  Intervention  No interventions have been documented.   STRESS TESTS  MYOCARDIAL PERFUSION IMAGING 07/18/2019  Narrative   Nuclear stress EF: 32%.  There was no ST segment deviation noted during stress.  No T wave inversion was noted during stress.  Defect 1: There is a large defect of moderate severity present in the basal inferior, basal inferolateral, basal anterolateral, mid inferolateral, mid anterolateral and apical lateral location.  Defect 2: There is a small defect of moderate severity present in the basal anterior, basal anteroseptal and basal inferoseptal location.  Findings consistent with prior myocardial infarction with peri-infarct ischemia.  This is a high risk study.  The left ventricular ejection fraction is moderately decreased (30-44%).  There is a large moderate severity defect in the lateral walls extending to basal inferior wall, consistent with infarct. There is a second defect at the base from the anterior wall to septum more consistent with ischemia. EF is significantly reduced, and all walls except septal walls appear hypokinetic at rest. Defects slightly worse than prior study.   ECHOCARDIOGRAM  ECHOCARDIOGRAM COMPLETE 07/17/2019  Narrative ECHOCARDIOGRAM REPORT    Patient Name:   Tracy [name] Date of Exam: 07/17/2019 Medical Rec #:  [id number]  Height:       65.0 in Accession #:    [id number]           Weight:       231.0 lb Date of Birth:  [birth year]            BSA:          2.10 m Patient Age:    50 years             BP:           159/89 mmHg Patient Gender: F                    HR:           83 bpm. Exam Location:  [facility]  Procedure: 2D Echo, Cardiac Doppler, Color Doppler and Intracardiac Opacification Agent  Indications:    I50.32 CHF  History:        Patient has prior history of Echocardiogram examinations, most recent 09/29/2017. CAD Signs/Symptoms:Shortness of Breath, Chest Pain and Edema Risk Factors:Hypertension and HLD.  Sonographer:    Marygrace Drought RCS Referring Phys: [place]   1. Left ventricular  ejection fraction, by visual estimation, is 55 to 60%. The left ventricle has normal function. Normal left ventricular size. There is no left ventricular hypertrophy. 2. Elevated left ventricular end-diastolic pressure. 3. Left ventricular diastolic Doppler parameters are consistent with impaired relaxation pattern of LV diastolic filling. 4. Global right ventricle has normal systolic function.The right ventricular size is normal. No increase in right ventricular wall thickness. 5. Left atrial size was normal. 6. Right atrial size was normal. 7. Mild mitral annular calcification. 8. The mitral valve is normal in structure. Mild mitral valve regurgitation. No evidence of mitral stenosis. 9. The tricuspid valve is normal in structure. Tricuspid valve regurgitation is trivial. 10. The aortic valve is grossly normal Aortic valve regurgitation was not visualized by color flow Doppler. Structurally normal aortic valve, with no evidence of sclerosis or stenosis. 11. The pulmonic valve was normal in structure. Pulmonic valve regurgitation is not visualized by color flow Doppler. 12. Normal pulmonary artery systolic pressure. 13. The inferior vena cava is normal in size with greater than 50% respiratory variability, suggesting right atrial pressure of 3 mmHg.  FINDINGS Left Ventricle: Left ventricular ejection fraction, by visual estimation, is 55 to 60%. The left ventricle has normal function. There is no left ventricular hypertrophy. Normal left ventricular size. Spectral Doppler shows Left ventricular diastolic Doppler parameters are consistent with impaired relaxation pattern of LV diastolic filling. Elevated left ventricular end-diastolic pressure.  Right Ventricle: The right ventricular size is normal. No increase in right ventricular wall thickness. Global RV systolic function is has normal systolic function. The tricuspid regurgitant velocity is 2.21 m/s, and with an assumed right atrial  pressure of 3 mmHg, the estimated right ventricular systolic pressure is normal at 22.6 mmHg.  Left Atrium: Left atrial size was normal in size.  Right Atrium: Right atrial size was normal in size  Pericardium: There is no evidence of pericardial effusion.  Mitral Valve: The mitral valve is normal in structure. Mild mitral annular calcification. No evidence of mitral valve stenosis by observation. Mild mitral valve regurgitation.  Tricuspid Valve: The tricuspid valve is normal in structure. Tricuspid valve regurgitation is trivial by color flow Doppler.  Aortic Valve: The aortic valve is grossly normal. Aortic valve regurgitation was not visualized by color flow Doppler. The aortic valve is structurally  normal, with no evidence of sclerosis or stenosis.  Pulmonic Valve: The pulmonic valve was normal in structure. Pulmonic valve regurgitation is not visualized by color flow Doppler.  Aorta: The aortic root, ascending aorta and aortic arch are all structurally normal, with no evidence of dilitation or obstruction.  Venous: The inferior vena cava is normal in size with greater than 50% respiratory variability, suggesting right atrial pressure of 3 mmHg.  IAS/Shunts: No atrial level shunt detected by color flow Doppler. No ventricular septal defect is seen or detected. There is no evidence of an atrial septal defect.    LEFT VENTRICLE PLAX 2D LVIDd:         5.22 cm  Diastology LVIDs:         4.08 cm  LV e' lateral:   7.83 cm/s LV PW:         1.18 cm  LV E/e' lateral: 10.0 LV IVS:        0.78 cm  LV e' medial:    5.33 cm/s LVOT diam:     2.00 cm  LV E/e' medial:  14.7 LV SV:         57 ml LV SV Index:   25.56    2D Longitudinal Strain LVOT Area:     3.14 cm 2D Strain GLS (A2C):   -19.0 % 2D Strain GLS (A3C):   -16.1 % 2D Strain GLS (A4C):   -15.8 % 2D Strain GLS Avg:     -17.0 %  RIGHT VENTRICLE RV Basal diam:  2.96 cm RV S prime:     8.70 cm/s TAPSE (M-mode): 1.3 cm RVSP:            22.6 mmHg  LEFT ATRIUM             Index       RIGHT ATRIUM           Index LA diam:        4.20 cm 2.00 cm/m  RA Pressure: 3.00 mmHg LA Vol (A2C):   55.6 ml 26.44 ml/m RA Area:     13.00 cm LA Vol (A4C):   51.2 ml 24.35 ml/m RA Volume:   32.00 ml  15.22 ml/m LA Biplane Vol: 55.1 ml 26.20 ml/m AORTIC VALVE LVOT Vmax:   75.40 cm/s LVOT Vmean:  50.400 cm/s LVOT VTI:    0.182 m  AORTA Ao Root diam: 2.90 cm  MITRAL VALVE                         TRICUSPID VALVE MV Area (PHT):                       TR Peak grad:   19.6 mmHg MV PHT:                              TR Vmax:        249.00 cm/s MV Decel Time: 141 msec              Estimated RAP:  3.00 mmHg MV E velocity: 78.10 cm/s  103 cm/s  RVSP:           22.6 mmHg MV A velocity: 104.00 cm/s 70.3 cm/s MV E/A ratio:  0.75        1.5       SHUNTS Systemic VTI:  0.18 m Systemic Diam: 2.00 cm  Skeet Latch MD Electronically signed by Skeet Latch MD Signature Date/Time: 07/17/2019/11:29:28 AM    Final              History of Present Illness:   NAVEH RICKLES is a 76 y.o. female with the above problem list.  She was last seen in March 2023 with Dr. Burt Knack. She noted some episodes of angina but was overall stable. Med Rx was continued unless she has progressive anginal symptoms. Of note, her last cath was done after a nuclear stress test was abnormal with anterior ischemia. She did not require PCI at her cath. She did go to the ED in Aug 2023 with chest pain. Her hsTrops were neg x 3, CT was neg for PE and EKG did not show acute ST-TW changes. The notes indicate she had more MSK type chest pain.   She returns for f/u.  She is here alone.  She has continued to have exertional chest discomfort.  This has been fairly stable since she last saw Dr. Burt Knack (aside from her trip to the emergency room).  She has not had radiating symptoms, diaphoresis.  She does note associated shortness of breath.  The symptoms that brought  her to the emergency room included left-sided discomfort radiating to her waist and back.  This was a squeezing sensation.  Her symptoms did go on for quite some time.  She had some mild improvement with nitroglycerin.  Her primary care provider plans to get a right upper quadrant ultrasound.  Over the past several weeks, she has noted worsening shortness of breath with exertion.  This is fairly new for her.  She has lower extremity edema.  Her left leg is always worse than her right.  She has noted redness in her left leg as well as discomfort.  She has not had orthopnea or syncope.    Past Medical History:  Diagnosis Date   Allergic rhinitis    Allergy    Anxiety    Arthritis    Breast cancer ([place]) 04/28/2020   rigtht breast   Bronchitis    CAD (coronary artery disease)    1/19 PCI/DES to [place] for ISR, normal EF.    Cancer ([place])    hx of precancerous cells in right breast    Cataract    Cervical dysplasia    unsure of procedure, possible "burning" in her late 66s   CHF (congestive heart failure) ([place])    Echo 06/2019: EF 55-60, elevated LVEDP, normal RV SF, mild MAC, mild MR, trivial TR   Complication of anesthesia    COPD (chronic obstructive pulmonary disease) (HCC)    early    Depression    Dyspnea    with exertion    Family history of adverse reaction to anesthesia    daughter- problems wiht n/v   GERD (gastroesophageal reflux disease)    Gout    Headache(784.0)    Heart murmur    hx of years ago    Hyperlipidemia    Hypertension    Low back pain    Menopausal syndrome    Myocardial infarct [facility]) 2007   hx of   Overactive bladder    PONV (postoperative nausea and vomiting)    Sleep apnea    Current Medications: Current Meds  Medication Sig   acetaminophen (TYLENOL) 500 MG tablet Take 500-1,000 mg by mouth every 6 (six) hours as needed for headache.    albuterol (VENTOLIN HFA) 108 (90 Base) MCG/ACT inhaler Inhale 2  puffs into the lungs every 4 (four) hours as needed  for wheezing.   allopurinol (ZYLOPRIM) 100 MG tablet TAKE 1 TABLET BY MOUTH EVERY DAY   amLODipine (NORVASC) 5 MG tablet TAKE 1 TABLET (5 MG TOTAL) BY MOUTH DAILY.   APPLE CIDER VINEGAR PO Take 2,400 mg by mouth daily.   ASPERCREME LIDOCAINE EX Apply 1 application topically daily as needed (pain).   aspirin EC 81 MG tablet Take 81 mg by mouth daily.   cephALEXin (KEFLEX) 500 MG capsule Take 1 capsule (500 mg total) by mouth 4 (four) times daily.   Cholecalciferol (VITAMIN D3) 50 MCG (2000 UT) TABS Take 4,000 Units by mouth daily.    clopidogrel (PLAVIX) 75 MG tablet TAKE 1 TABLET BY MOUTH EVERY DAY WITH BREAKFAST   Cyanocobalamin (VITAMIN B-12) 5000 MCG SUBL Place 5,000 mcg under the tongue daily.    diclofenac Sodium (VOLTAREN) 1 % GEL Apply 2 g topically 4 (four) times daily as needed.   ezetimibe (ZETIA) 10 MG tablet TAKE ONE TABLET BY MOUTH DAILY   fluticasone (FLONASE) 50 MCG/ACT nasal spray SPRAY 2 SPRAYS INTO EACH NOSTRIL EVERY DAY   furosemide (LASIX) 40 MG tablet Take 1 tablet by mouth twice a day for 1 week then reduce back to 1 tablet by mouth daily   guaiFENesin (MUCINEX) 600 MG 12 hr tablet Take 600 mg by mouth 2 (two) times daily as needed (congestion).   isosorbide mononitrate (IMDUR) 30 MG 24 hr tablet TAKE 1 TABLET BY MOUTH EVERY DAY   losartan (COZAAR) 100 MG tablet TAKE 1 TABLET BY MOUTH EVERY DAY   metoprolol succinate (TOPROL-XL) 100 MG 24 hr tablet TAKE 1 TABLET BY MOUTH DAILY. TAKE WITH OR IMMEDIATELY FOLLOWING A MEAL.   Misc Natural Products (TART CHERRY ADVANCED) CAPS Take 1,000 capsules by mouth daily.    nitroGLYCERIN (NITROSTAT) 0.4 MG SL tablet PLACE 1 TABLET UNDER THE TONGUE EVERY 5 MINUTES AS NEEDED FOR CHEST PAIN   oxymetazoline (AFRIN) 0.05 % nasal spray Place 1 spray into both nostrils 2 (two) times daily as needed for congestion.   pantoprazole (PROTONIX) 40 MG tablet TAKE 1 TABLET BY MOUTH EVERY DAY   potassium chloride SA (KLOR-CON M) 20 MEQ tablet Take 2  tablets by mouth in the morning and 1 tablet by mouth in the p.m for 1 week then reduce back to 1 tablet by mouth twice a day   pravastatin (PRAVACHOL) 40 MG tablet TAKE 1 TABLET BY MOUTH EVERY DAY IN THE EVENING   Simethicone (GAS-X PO) Take 2 tablets by mouth daily as needed (gas).   triamcinolone cream (KENALOG) 0.1 % APPLY TO AFFECTED AREA TWICE A DAY   [DISCONTINUED] furosemide (LASIX) 40 MG tablet TAKE 1 TABLET BY MOUTH EVERY DAY   [DISCONTINUED] KLOR-CON M20 20 MEQ tablet TAKE 1 TABLET BY MOUTH TWICE A DAY   Current Facility-Administered Medications for the 08/24/22 encounter (Office Visit) with Liliane Shi, PA-C  Medication   0.9 %  sodium chloride infusion    Allergies:   Erythromycin and Sulfamethoxazole   Social History   Tobacco Use   Smoking status: Former    Packs/day: 1.25    Years: 52.00    Total pack years: 65.00    Types: Cigarettes    Quit date: 2018    Years since quitting: 5.8   Smokeless tobacco: Never   Tobacco comments:    completely quit May of 2018; period of years she did not smoke   Vaping Use  Vaping Use: Never used  Substance Use Topics   Alcohol use: Yes    Comment: seldom   Drug use: No    Family Hx: The patient's family history includes Alzheimer's disease in her maternal uncle; Cancer in her mother; Colon cancer (age of onset: 41) in her mother; Depression in her mother; Diabetes in her daughter; Other in an other family member. There is no history of Stomach cancer. She was adopted.  Review of Systems  Constitutional: Negative for fever.  Respiratory:  Negative for cough.   Gastrointestinal:  Negative for hematochezia.  Genitourinary:  Negative for hematuria.     EKGs/Labs/Other Test Reviewed:    EKG:  EKG is not ordered today.  The ekg ordered today demonstrates n/a   Recent Labs: 06/13/2022: ALT 12; BUN 46; Creatinine, Ser 1.26; Hemoglobin 13.2; Platelets 239; Potassium 4.8; Sodium 136   Recent Lipid Panel No results for  input(s): "CHOL", "TRIG", "HDL", "VLDL", "LDLCALC", "LDLDIRECT" in the last 8760 hours.    Risk Assessment/Calculations/Metrics:              Physical Exam:    VS:  BP 118/78   Pulse 100   Ht _0  (1.626 m)   Wt 219 lb 6.4 oz (99.5 kg)   SpO2 92%   BMI 37.66 kg/m     Wt Readings from Last 3 Encounters:  08/24/22 219 lb 6.4 oz (99.5 kg)  07/27/22 218 lb (98.9 kg)  06/22/22 213 lb (96.6 kg)    Constitutional:      Appearance: Healthy appearance. Not in distress.  Neck:     Vascular: JVD elevated.  Pulmonary:     Effort: Pulmonary effort is normal.     Breath sounds: No wheezing. Bibasilar Rales (?very faint bibasilar) present.  Cardiovascular:     Normal rate. Regular rhythm. Normal S1. Normal S2.      Murmurs: There is no murmur.  Edema:    Peripheral edema (L leg (brawny edema)  >> R leg; + L leg mild erythema) present.    Pretibial: 3+ edema of the left pretibial area and 2+ edema of the right pretibial area. Abdominal:     Palpations: Abdomen is soft.  Skin:    General: Skin is warm and dry.  Neurological:     Mental Status: Alert and oriented to person, place and time.         ASSESSMENT & PLAN:   Chronic heart failure with preserved ejection fraction ([place]) She notes worsening shortness of breath with exertion as well as lower extremity edema.  Her weight is up 16 pounds since August.  JVP is elevated on exam and there is a question of bibasilar rales.  I suspect her symptoms are related to volume excess.   Increase furosemide to 40 mg twice daily for a week.   Increase K+ to 40 mEq in A and 20 mEq in P x 1 week Resume usual dose of Furosemide and K+ after 1 week BMET, BNP today CMET in 1 week F/u with me or Dr. Burt Knack in 2 weeks. Consider adding SGLT2 inhibitor versus spironolactone in the future.  Coronary artery disease History of prior stenting to the LCx in 2007 and subsequent DES to the thousand 19.  She has had chronic stable angina.  Her most  recent cardiac catheterization was in December 2021.  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  Medical therapy was recommended at that  time.  She had fairly stable symptoms when she saw Dr. Burt Knack in March.  It was recommended to continue medical therapy at that time unless she had progressive symptoms.  She did go to the emergency room in August.  She had prolonged chest pain with completely normal troponins.  Her electrocardiogram was personally reviewed and did not demonstrate any acute changes.  Since her trip to the emergency room, she has had fairly stable anginal symptoms.  We discussed the potential for proceeding with cardiac catheterization versus diuresis for congestive heart failure and early follow-up.  I favor diuresis and early follow-up.  She agrees with this. Manage heart failure as outlined Continue amlodipine 5 mg daily, aspirin 81 mg daily, Plavix 75 mg daily, Zetia 10 mg daily, Imdur 30 mg daily, Toprol-XL 100 mg daily, pravastatin 40 mg daily Follow-up 2 weeks If volume status stable and chest pain/shortness of breath symptoms persist, consider right and left heart cath  Leg edema, left She has a history of her left leg being much larger than her right.  Recently, she has developed erythema and discomfort.  Her edema is due to volume excess from HFpEF.  However, I am concerned about her erythema and discomfort (?DVT, ?Cellulitis).  Arrange venous duplex to rule out DVT.  Start cephalexin 500 mg 4 times a day for 1 week.  Follow-up in 2 weeks.  Essential hypertension The patient's blood pressure is controlled on her current regimen.  Continue current therapy.   Pure hypercholesterolemia She saw Dr. Debara Pickett last year.  She was placed back on pravastatin in addition to ezetimibe.  Her LDL did improve.  Arrange follow-up fasting CMET, lipids.  Continue Zetia 10 mg daily, pravastatin 40 mg daily.  CKD  (chronic kidney disease), stage III (HCC) Monitor renal function and potassium closely with adjustments in diuretics.           Dispo:  Return in about 2 weeks (around 09/07/2022) for Close Follow Up, w/ Dr. Burt Knack, or Richardson Dopp, PA-C.   Medication Adjustments/Labs and Tests Ordered: Current medicines are reviewed at length with the patient today.  Concerns regarding medicines are outlined above.  Tests Ordered: Orders Placed This Encounter  Procedures   Basic metabolic panel   Pro b natriuretic peptide (BNP)   Comp Met (CMET)   Lipid panel   ECHOCARDIOGRAM COMPLETE   VAS [place] LOWER EXTREMITY VENOUS (DVT)   Medication Changes: Meds ordered this encounter  Medications   furosemide (LASIX) 40 MG tablet    Sig: Take 1 tablet by mouth twice a day for 1 week then reduce back to 1 tablet by mouth daily    Dispense:  100 tablet    Refill:  3   potassium chloride SA (KLOR-CON M) 20 MEQ tablet    Sig: Take 2 tablets by mouth in the morning and 1 tablet by mouth in the p.m for 1 week then reduce back to 1 tablet by mouth twice a day    Dispense:  200 tablet    Refill:  3   cephALEXin (KEFLEX) 500 MG capsule    Sig: Take 1 capsule (500 mg total) by mouth 4 (four) times daily.    Dispense:  28 capsule    Refill:  0   Signed, Richardson Dopp, PA-C  08/24/2022 12:13 PM    [address], [place], [place]  [address] Phone: [phone]; Fax: [phone]

## 2022-08-24 ENCOUNTER — Encounter: Payer: Self-pay | Admitting: Physician Assistant

## 2022-08-24 ENCOUNTER — Ambulatory Visit: Payer: Medicare Other | Attending: Physician Assistant | Admitting: Physician Assistant

## 2022-08-24 VITALS — BP 118/78 | HR 100 | Ht 64.0 in | Wt 219.4 lb

## 2022-08-24 DIAGNOSIS — M79662 Pain in left lower leg: Secondary | ICD-10-CM | POA: Insufficient documentation

## 2022-08-24 DIAGNOSIS — M79661 Pain in right lower leg: Secondary | ICD-10-CM | POA: Diagnosis not present

## 2022-08-24 DIAGNOSIS — R600 Localized edema: Secondary | ICD-10-CM | POA: Insufficient documentation

## 2022-08-24 DIAGNOSIS — I25118 Atherosclerotic heart disease of native coronary artery with other forms of angina pectoris: Secondary | ICD-10-CM | POA: Insufficient documentation

## 2022-08-24 DIAGNOSIS — E7800 Pure hypercholesterolemia, unspecified: Secondary | ICD-10-CM | POA: Diagnosis not present

## 2022-08-24 DIAGNOSIS — I5033 Acute on chronic diastolic (congestive) heart failure: Secondary | ICD-10-CM | POA: Insufficient documentation

## 2022-08-24 DIAGNOSIS — I10 Essential (primary) hypertension: Secondary | ICD-10-CM | POA: Diagnosis not present

## 2022-08-24 DIAGNOSIS — N1831 Chronic kidney disease, stage 3a: Secondary | ICD-10-CM | POA: Diagnosis not present

## 2022-08-24 MED ORDER — FUROSEMIDE 40 MG PO TABS: ORAL_TABLET | ORAL | 3 refills | Status: DC

## 2022-08-24 MED ORDER — CEPHALEXIN 500 MG PO CAPS: 500.0000 mg | ORAL_CAPSULE | Freq: Four times a day (QID) | ORAL | 0 refills | Status: DC

## 2022-08-24 MED ORDER — POTASSIUM CHLORIDE CRYS ER 20 MEQ PO TBCR: EXTENDED_RELEASE_TABLET | ORAL | 3 refills | Status: DC

## 2022-08-24 NOTE — Assessment & Plan Note (Signed)
The patient's blood pressure is controlled on her current regimen.  Continue current therapy.   

## 2022-08-24 NOTE — Assessment & Plan Note (Addendum)
She notes worsening shortness of breath with exertion as well as lower extremity edema.  Her weight is up 16 pounds since August.  JVP is elevated on exam and there is a question of bibasilar rales.  I suspect her symptoms are related to volume excess.   Increase furosemide to 40 mg twice daily for a week.   Increase K+ to 40 mEq in A and 20 mEq in P x 1 week Resume usual dose of Furosemide and K+ after 1 week BMET, BNP today CMET in 1 week F/u with me or Dr. Burt Knack in 2 weeks. Consider adding SGLT2 inhibitor versus spironolactone in the future.

## 2022-08-24 NOTE — Patient Instructions (Signed)
Medication Instructions:  Your physician has recommended you make the following change in your medication:  INCREASE the Lasix to 40 mg taking 1 tablet twice a day for 1 week then go back down to 1 tablet daily   INCREASE the Potassium to 20 meq taking 2 tablets in the a.m. and 1 tablet in the pm for 1 week then go back to 20 meq 1 tablet twice a day   START Keflex 500 taking every 6 hours for 1 week   *If you need a refill on your cardiac medications before your next appointment, please call your pharmacy*   Lab Work: TODAY:  BMET & PRO BNP  1 WEEK:  [place]  If you have labs (blood work) drawn today and your tests are completely normal, you will receive your results only by: MyChart Message (if you have MyChart) OR A paper copy in the mail If you have any lab test that is abnormal or we need to change your treatment, we will call you to review the results.   Testing/Procedures: Your physician has requested that you have an echocardiogram. Echocardiography is a painless test that uses sound waves to create images of your heart. It provides your doctor with information about the size and shape of your heart and how well your heart's chambers and valves are working. This procedure takes approximately one hour. There are no restrictions for this procedure. Please do NOT wear cologne, perfume, aftershave, or lotions (deodorant is allowed). Please arrive 15 minutes prior to your appointment time.  Your physician recommends that you have a Vascular US Venous to rule out a DVT.  This needs to be done ASAP   Follow-Up: At [facility], you and your health needs are our priority.  As part of our continuing mission to provide you with exceptional heart care, we have created designated Provider Care Teams.  These Care Teams include your primary Cardiologist (physician) and Advanced Practice Providers (APPs -  Physician Assistants and Nurse Practitioners) who all work together  to provide you with the care you need, when you need it.  We recommend signing up for the patient portal called "MyChart".  Sign up information is provided on this After Visit Summary.  MyChart is used to connect with patients for Virtual Visits (Telemedicine).  Patients are able to view lab/test results, encounter notes, upcoming appointments, etc.  Non-urgent messages can be sent to your provider as well.   To learn more about what you can do with MyChart, go to [email or web address].    Your next appointment:   2 week(s)  The format for your next appointment:   In Person  Provider:   Sherren Mocha, MD  or Richardson Dopp, PA-C    ON A DAY DR. Burt Knack IS IN THE OFFICE Other Instructions   Important Information About Sugar

## 2022-08-24 NOTE — Assessment & Plan Note (Signed)
Monitor renal function and potassium closely with adjustments in diuretics.

## 2022-08-24 NOTE — Assessment & Plan Note (Signed)
History of prior stenting to the LCx in 2007 and subsequent DES to the thousand 19.  She has had chronic stable angina.  Her most recent cardiac catheterization was in December 2021.  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  Medical therapy was recommended at that time.  She had fairly stable symptoms when she saw Dr. Burt Knack in March.  It was recommended to continue medical therapy at that time unless she had progressive symptoms.  She did go to the emergency room in August.  She had prolonged chest pain with completely normal troponins.  Her electrocardiogram was personally reviewed and did not demonstrate any acute changes.  Since her trip to the emergency room, she has had fairly stable anginal symptoms.  We discussed the potential for proceeding with cardiac catheterization versus diuresis for congestive heart failure and early follow-up.  I favor diuresis and early follow-up.  She agrees with this. Manage heart failure as outlined Continue amlodipine 5 mg daily, aspirin 81 mg daily, Plavix 75 mg daily, Zetia 10 mg daily, Imdur 30 mg daily, Toprol-XL 100 mg daily, pravastatin 40 mg daily Follow-up 2 weeks If volume status stable and chest pain/shortness of breath symptoms persist, consider right and left heart cath

## 2022-08-24 NOTE — Assessment & Plan Note (Signed)
She saw Dr. Debara Pickett last year.  She was placed back on pravastatin in addition to ezetimibe.  Her LDL did improve.  Arrange follow-up fasting CMET, lipids.  Continue Zetia 10 mg daily, pravastatin 40 mg daily.

## 2022-08-24 NOTE — Assessment & Plan Note (Addendum)
She has a history of her left leg being much larger than her right.  Recently, she has developed erythema and discomfort.  Her edema is due to volume excess from HFpEF.  However, I am concerned about her erythema and discomfort (?DVT, ?Cellulitis).  Arrange venous duplex to rule out DVT.  Start cephalexin 500 mg 4 times a day for 1 week.  Follow-up in 2 weeks.

## 2022-08-25 ENCOUNTER — Ambulatory Visit (HOSPITAL_COMMUNITY)
Admission: RE | Admit: 2022-08-25 | Discharge: 2022-08-25 | Disposition: A | Discharge status: Home / Self Care | Payer: Medicare Other | Source: Ambulatory Visit | Attending: Physician Assistant | Admitting: Physician Assistant

## 2022-08-25 DIAGNOSIS — I25118 Atherosclerotic heart disease of native coronary artery with other forms of angina pectoris: Secondary | ICD-10-CM

## 2022-08-25 DIAGNOSIS — E7800 Pure hypercholesterolemia, unspecified: Secondary | ICD-10-CM

## 2022-08-25 DIAGNOSIS — I10 Essential (primary) hypertension: Secondary | ICD-10-CM

## 2022-08-25 DIAGNOSIS — M79661 Pain in right lower leg: Secondary | ICD-10-CM

## 2022-08-25 DIAGNOSIS — I5033 Acute on chronic diastolic (congestive) heart failure: Secondary | ICD-10-CM

## 2022-08-25 DIAGNOSIS — R600 Localized edema: Secondary | ICD-10-CM

## 2022-08-25 LAB — BASIC METABOLIC PANEL
BUN/Creatinine Ratio: 23 (ref 12–28)
BUN: 35 mg/dL — ABNORMAL HIGH (ref 8–27)
CO2: 24 mmol/L (ref 20–29)
Calcium: 9.8 mg/dL (ref 8.7–10.3)
Chloride: 104 mmol/L (ref 96–106)
Creatinine, Ser: 1.55 mg/dL — ABNORMAL HIGH (ref 0.57–1.00)
Glucose: 95 mg/dL (ref 70–99)
Potassium: 4.6 mmol/L (ref 3.5–5.2)
Sodium: 142 mmol/L (ref 134–144)
eGFR: 35 mL/min/{1.73_m2} — ABNORMAL LOW (ref >59)

## 2022-08-25 LAB — PRO B NATRIURETIC PEPTIDE: NT-Pro BNP: 241 pg/mL (ref 0–301)

## 2022-08-26 ENCOUNTER — Telehealth: Payer: Self-pay | Admitting: Physician Assistant

## 2022-08-26 MED ORDER — POTASSIUM CHLORIDE CRYS ER 20 MEQ PO TBCR: EXTENDED_RELEASE_TABLET | ORAL | 3 refills | Status: DC

## 2022-08-26 MED ORDER — FUROSEMIDE 40 MG PO TABS: ORAL_TABLET | ORAL | 3 refills | Status: DC

## 2022-08-26 NOTE — Telephone Encounter (Signed)
The patient has been notified of the result and verbalized understanding.  All questions (if any) were answered. Darrell Jewel, RN 08/26/2022 9:11 AM    Asked to send message over mychart as well. Sent in new prescription to pharmacy and called and canceled ones from yesterday.

## 2022-08-26 NOTE — Telephone Encounter (Signed)
Pt calling back for lab results

## 2022-08-26 NOTE — Telephone Encounter (Signed)
-----   Message from Liliane Shi, PA-C sent at 08/25/2022 11:00 AM EST ----- Creatinine fairly stable. K+ normal. BNP normal.  Exam, symptoms did suggest volume excess yesterday. However, with normal BNP, I want to change her directions. At [facility] yesterday, she was told to increase Lasix to 40 mg twice daily and K+ to 40 in A and 20 in P  PLAN:  -Change Lasix directions to Lasix 40 mg twice daily x 4 days only (instead of 1 week), then resume 40 mg once daily -Change K+ to 40 mEq in A and 20 mEq in P x 4 days only (instead of 1 week), then resume 20 mEq twice daily.  Richardson Dopp, PA-C    08/25/2022 10:56 AM

## 2022-08-27 ENCOUNTER — Other Ambulatory Visit: Payer: Self-pay | Admitting: Family Medicine

## 2022-08-27 DIAGNOSIS — G8929 Other chronic pain: Secondary | ICD-10-CM

## 2022-08-31 ENCOUNTER — Ambulatory Visit: Payer: Medicare Other | Attending: Physician Assistant

## 2022-08-31 DIAGNOSIS — E7800 Pure hypercholesterolemia, unspecified: Secondary | ICD-10-CM

## 2022-08-31 DIAGNOSIS — I5033 Acute on chronic diastolic (congestive) heart failure: Secondary | ICD-10-CM

## 2022-08-31 DIAGNOSIS — I25118 Atherosclerotic heart disease of native coronary artery with other forms of angina pectoris: Secondary | ICD-10-CM

## 2022-08-31 DIAGNOSIS — R600 Localized edema: Secondary | ICD-10-CM

## 2022-08-31 DIAGNOSIS — I10 Essential (primary) hypertension: Secondary | ICD-10-CM

## 2022-08-31 DIAGNOSIS — M79662 Pain in left lower leg: Secondary | ICD-10-CM | POA: Diagnosis not present

## 2022-08-31 DIAGNOSIS — M79661 Pain in right lower leg: Secondary | ICD-10-CM

## 2022-08-31 LAB — COMPREHENSIVE METABOLIC PANEL
ALT: 21 IU/L (ref 0–32)
AST: 20 IU/L (ref 0–40)
Albumin/Globulin Ratio: 1.1 — ABNORMAL LOW (ref 1.2–2.2)
Albumin: 4.2 g/dL (ref 3.8–4.8)
Alkaline Phosphatase: 105 IU/L (ref 44–121)
BUN/Creatinine Ratio: 26 (ref 12–28)
BUN: 45 mg/dL — ABNORMAL HIGH (ref 8–27)
Bilirubin Total: 0.3 mg/dL (ref 0.0–1.2)
CO2: 24 mmol/L (ref 20–29)
Calcium: 10 mg/dL (ref 8.7–10.3)
Chloride: 103 mmol/L (ref 96–106)
Creatinine, Ser: 1.76 mg/dL — ABNORMAL HIGH (ref 0.57–1.00)
Globulin, Total: 3.8 g/dL (ref 1.5–4.5)
Glucose: 128 mg/dL — ABNORMAL HIGH (ref 70–99)
Potassium: 5.1 mmol/L (ref 3.5–5.2)
Sodium: 140 mmol/L (ref 134–144)
Total Protein: 8 g/dL (ref 6.0–8.5)
eGFR: 30 mL/min/{1.73_m2} — ABNORMAL LOW (ref >59)

## 2022-08-31 LAB — LIPID PANEL
Chol/HDL Ratio: 4.6 ratio — ABNORMAL HIGH (ref 0.0–4.4)
Cholesterol, Total: 193 mg/dL (ref 100–199)
HDL: 42 mg/dL (ref >39)
LDL Chol Calc (NIH): 113 mg/dL — ABNORMAL HIGH (ref 0–99)
Triglycerides: 218 mg/dL — ABNORMAL HIGH (ref 0–149)
VLDL Cholesterol Cal: 38 mg/dL (ref 5–40)

## 2022-09-02 ENCOUNTER — Telehealth: Payer: Self-pay | Admitting: *Deleted

## 2022-09-02 DIAGNOSIS — M19011 Primary osteoarthritis, right shoulder: Secondary | ICD-10-CM | POA: Diagnosis not present

## 2022-09-02 DIAGNOSIS — E7800 Pure hypercholesterolemia, unspecified: Secondary | ICD-10-CM

## 2022-09-02 NOTE — Telephone Encounter (Signed)
-----   Message from Liliane Shi, [place] sent at 09/02/2022  1:28 PM EST ----- Please refer to PharmD for PCSK9 inhib Richardson Dopp, PA-C    09/02/2022 1:27 PM

## 2022-09-03 ENCOUNTER — Other Ambulatory Visit: Payer: Self-pay | Admitting: Family Medicine

## 2022-09-03 DIAGNOSIS — I872 Venous insufficiency (chronic) (peripheral): Secondary | ICD-10-CM

## 2022-09-04 NOTE — Progress Notes (Signed)
Cardiology Office Note:    Date:  09/05/2022   ID:  [name], DOB [birth year], MRN [id number]  PCP:  [name], [name], [place] Providers Cardiologist:  Sherren Mocha, MD Cardiology APP:  Sharmon Revere     Referring MD: [name], [name], MD   Chief Complaint:  F/u for CHF, CAD, Leg cellulitis     Patient Profile: Coronary artery disease  S/p BMS to LCx in 2007; S/p DES to LCx in 10/2017 Myoview 07/2019: ant ischemia; high risk >> Cath 07/2019: patent LCx, stable anatomy - Med Rx Cath 09/29/20: oLAD 30, mLAD 60/70, oD1 38, 1st Sept Perf 100 (R-L collats); oLCx 25, pLCx stent ok w 10 ISR, pRCA 25, EF 50-55 >> Med Rx  Heart failure with preserved ejection fraction Echo 12/18: EF 50-55, Gr 1 DD Echo 07/17/19: EF 55-60, GR 1 DD, NL RVSF, mild MR, trivial TR, NL PASP, RAP 3 Chronic kidney disease 3 Chronic Obstructive Pulmonary Disease  Hypertension  Hyperlipidemia  Intol of statins (myalgias) // Elevated LFTs >> Statin DCd (Lipitor) Eval w [facility] (Dr. Debara Pickett) >> +Pravastatin w improved LDL Carotid artery stenosis             [place] 04/21/21: bilat ICA 1-39  Hepatic steatosis  Aortic atherosclerosis (CT 11/2020) S/p BSO, mini-lap for drainage of contained cyst (10/2020) Hx of CVA (remote lacunar infarct noted on MRI)  Cardiac Studies & Procedures   CARDIAC CATHETERIZATION  CARDIAC CATHETERIZATION 09/29/2020  Narrative  Right dominant coronary anatomy with no noted obstructive disease in the RCA.  Patent relatively short left main.  Large tortuous and angulated LAD with segmental 50 to 60% proximal to mid narrowing and tandem mid to distal 70% stenoses after the large first diagonal.  The diagonal contains 50% proximal narrowing.  The vessel is tortuous and heavily calcified.  Circumflex contains a long, possibly overlapping stented segment that is widely patent.  The vessel is heavily calcified.  Mid range LVEF estimated  at 50% with EDP 14 m mercury.  Mild pulmonary hypertension with mean pressure 24 mmHg.  Capillary wedge pressure mean is 15 mmHg.  Pulmonary vascular resistance is 1.42 Wood units.  The minimal elevation in pressure is suggested to be WHO group 3 or 5.  RECOMMENDATIONS:   Per treating team.  Findings Coronary Findings Diagnostic  Dominance: Right  Left Main Vessel is large.  Left Anterior Descending Vessel is large. The vessel is moderately tortuous. Overall appearance is not significantly different than prior studies in 2011 and 2012. Ost LAD lesion is 30% stenosed. The lesion is eccentric. The lesion is calcified. Prox LAD lesion is 50% stenosed. The lesion is eccentric. The lesion is moderately calcified. Mid LAD-1 lesion is 60% stenosed. Mid LAD-2 lesion is 70% stenosed.  First Diagonal Branch Vessel is small in size. Ost 1st Diag lesion is 40% stenosed.  First Septal Branch Collaterals 1st Sept filled by collaterals from Inf Sept.  Ost 1st Sept lesion is 100% stenosed.  Second Diagonal Branch Vessel is large in size.  Third Diagonal Branch Vessel is small in size.  Left Circumflex Vessel is large. Ost Cx lesion is 25% stenosed. Prox Cx to Mid Cx lesion is 10% stenosed. The lesion was previously treated using a drug eluting stent between 1-2 years ago.  First Obtuse Marginal Branch Vessel is small in size.  Second Obtuse Marginal Branch Vessel is small in size.  Third Obtuse Marginal Branch Vessel is  large in size.  Right Coronary Artery Vessel is large. The vessel is mildly tortuous. Prox RCA to Mid RCA lesion is 25% stenosed. The lesion is eccentric. The lesion is calcified.  Intervention  No interventions have been documented.   CARDIAC CATHETERIZATION  CARDIAC CATHETERIZATION 07/24/2019  Narrative  Ost LAD lesion is 30% stenosed.  Prox LAD to Mid LAD lesion is 50% stenosed.  Mid LAD lesion is 60% stenosed.  Mid LAD to Dist LAD lesion is  40% stenosed.  Ost Cx lesion is 25% stenosed.  Prox RCA to Mid RCA lesion is 25% stenosed.  Prox Cx to Mid Cx lesion is 10% stenosed.  1. The left main is patent with no obstructive disease. 2. The LAD is a large caliber vessel that courses to the apex. The mid LAD has moderate disease that is unchanged from the catheterization in 2019. The lesions in the mid LAD do not appear to be flow limiting. 3. The stent in the mid Circumflex artery is patent with minimal restenosis 4. The RCA is a large dominant vessel with mild proximal to mid stenosis.  Recommendations: Continue medical management of CAD  Findings Coronary Findings Diagnostic  Dominance: Right  Left Main Vessel is large.  Left Anterior Descending Vessel is large. The vessel is moderately tortuous. Overall appearance is not significantly different than prior studies in 2011 and 2012. Ost LAD lesion is 30% stenosed. The lesion is eccentric. The lesion is calcified. Prox LAD to Mid LAD lesion is 50% stenosed. The lesion is eccentric. The lesion is moderately calcified. Mid LAD lesion is 60% stenosed. Mid LAD to Dist LAD lesion is 40% stenosed.  First Diagonal Branch Vessel is small in size.  Second Diagonal Branch Vessel is large in size.  Third Diagonal Branch Vessel is small in size.  Left Circumflex Vessel is large. Ost Cx lesion is 25% stenosed. Prox Cx to Mid Cx lesion is 10% stenosed. The lesion was previously treated using a drug eluting stent between 1-2 years ago.  First Obtuse Marginal Branch Vessel is small in size.  Second Obtuse Marginal Branch Vessel is small in size.  Third Obtuse Marginal Branch Vessel is large in size.  Right Coronary Artery Vessel is large. The vessel is mildly tortuous. Prox RCA to Mid RCA lesion is 25% stenosed. The lesion is eccentric. The lesion is calcified.  Intervention  No interventions have been documented.   STRESS TESTS  MYOCARDIAL PERFUSION IMAGING  07/18/2019  Narrative  Nuclear stress EF: 32%.  There was no ST segment deviation noted during stress.  No T wave inversion was noted during stress.  Defect 1: There is a large defect of moderate severity present in the basal inferior, basal inferolateral, basal anterolateral, mid inferolateral, mid anterolateral and apical lateral location.  Defect 2: There is a small defect of moderate severity present in the basal anterior, basal anteroseptal and basal inferoseptal location.  Findings consistent with prior myocardial infarction with peri-infarct ischemia.  This is a high risk study.  The left ventricular ejection fraction is moderately decreased (30-44%).  There is a large moderate severity defect in the lateral walls extending to basal inferior wall, consistent with infarct. There is a second defect at the base from the anterior wall to septum more consistent with ischemia. EF is significantly reduced, and all walls except septal walls appear hypokinetic at rest. Defects slightly worse than prior study.   ECHOCARDIOGRAM  ECHOCARDIOGRAM COMPLETE 07/17/2019  Narrative ECHOCARDIOGRAM REPORT    Patient Name:  [name] Date of Exam: 07/17/2019 Medical Rec #:  [id number]            Height:       65.0 in Accession #:    [id number]           Weight:       231.0 lb Date of Birth:  1946-01-22            BSA:          2.10 m Patient Age:    76 years             BP:           159/89 mmHg Patient Gender: F                    HR:           83 bpm. Exam Location:  [facility]  Procedure: 2D Echo, Cardiac Doppler, Color Doppler and Intracardiac Opacification Agent  Indications:    I50.32 CHF  History:        Patient has prior history of Echocardiogram examinations, most recent 09/29/2017. CAD Signs/Symptoms:Shortness of Breath, Chest Pain and Edema Risk Factors:Hypertension and HLD.  Sonographer:    Marygrace Drought RCS Referring Phys: [place]   1. Left ventricular ejection fraction, by visual estimation, is 55 to 60%. The left ventricle has normal function. Normal left ventricular size. There is no left ventricular hypertrophy. 2. Elevated left ventricular end-diastolic pressure. 3. Left ventricular diastolic Doppler parameters are consistent with impaired relaxation pattern of LV diastolic filling. 4. Global right ventricle has normal systolic function.The right ventricular size is normal. No increase in right ventricular wall thickness. 5. Left atrial size was normal. 6. Right atrial size was normal. 7. Mild mitral annular calcification. 8. The mitral valve is normal in structure. Mild mitral valve regurgitation. No evidence of mitral stenosis. 9. The tricuspid valve is normal in structure. Tricuspid valve regurgitation is trivial. 10. The aortic valve is grossly normal Aortic valve regurgitation was not visualized by color flow Doppler. Structurally normal aortic valve, with no evidence of sclerosis or stenosis. 11. The pulmonic valve was normal in structure. Pulmonic valve regurgitation is not visualized by color flow Doppler. 12. Normal pulmonary artery systolic pressure. 13. The inferior vena cava is normal in size with greater than 50% respiratory variability, suggesting right atrial pressure of 3 mmHg.  FINDINGS Left Ventricle: Left ventricular ejection fraction, by visual estimation, is 55 to 60%. The left ventricle has normal function. There is no left ventricular hypertrophy. Normal left ventricular size. Spectral Doppler shows Left ventricular diastolic Doppler parameters are consistent with impaired relaxation pattern of LV diastolic filling. Elevated left ventricular end-diastolic pressure.  Right Ventricle: The right ventricular size is normal. No increase in right ventricular wall thickness. Global RV systolic function is has normal systolic function. The tricuspid regurgitant velocity is 2.21  m/s, and with an assumed right atrial pressure of 3 mmHg, the estimated right ventricular systolic pressure is normal at 22.6 mmHg.  Left Atrium: Left atrial size was normal in size.  Right Atrium: Right atrial size was normal in size  Pericardium: There is no evidence of pericardial effusion.  Mitral Valve: The mitral valve is normal in structure. Mild mitral annular calcification. No evidence of mitral valve stenosis by observation. Mild mitral valve regurgitation.  Tricuspid Valve: The tricuspid valve is normal in structure. Tricuspid valve regurgitation is trivial by color flow Doppler.  Aortic Valve: The aortic valve is grossly normal. Aortic valve regurgitation was not visualized by color flow Doppler. The aortic valve is structurally normal, with no evidence of sclerosis or stenosis.  Pulmonic Valve: The pulmonic valve was normal in structure. Pulmonic valve regurgitation is not visualized by color flow Doppler.  Aorta: The aortic root, ascending aorta and aortic arch are all structurally normal, with no evidence of dilitation or obstruction.  Venous: The inferior vena cava is normal in size with greater than 50% respiratory variability, suggesting right atrial pressure of 3 mmHg.  IAS/Shunts: No atrial level shunt detected by color flow Doppler. No ventricular septal defect is seen or detected. There is no evidence of an atrial septal defect.    LEFT VENTRICLE PLAX 2D LVIDd:         5.22 cm  Diastology LVIDs:         4.08 cm  LV e' lateral:   7.83 cm/s LV PW:         1.18 cm  LV E/e' lateral: 10.0 LV IVS:        0.78 cm  LV e' medial:    5.33 cm/s LVOT diam:     2.00 cm  LV E/e' medial:  14.7 LV SV:         57 ml LV SV Index:   25.56    2D Longitudinal Strain LVOT Area:     3.14 cm 2D Strain GLS (A2C):   -19.0 % 2D Strain GLS (A3C):   -16.1 % 2D Strain GLS (A4C):   -15.8 % 2D Strain GLS Avg:     -17.0 %  RIGHT VENTRICLE RV Basal diam:  2.96 cm RV S prime:     8.70  cm/s TAPSE (M-mode): 1.3 cm RVSP:           22.6 mmHg  LEFT ATRIUM             Index       RIGHT ATRIUM           Index LA diam:        4.20 cm 2.00 cm/m  RA Pressure: 3.00 mmHg LA Vol (A2C):   55.6 ml 26.44 ml/m RA Area:     13.00 cm LA Vol (A4C):   51.2 ml 24.35 ml/m RA Volume:   32.00 ml  15.22 ml/m LA Biplane Vol: 55.1 ml 26.20 ml/m AORTIC VALVE LVOT Vmax:   75.40 cm/s LVOT Vmean:  50.400 cm/s LVOT VTI:    0.182 m  AORTA Ao Root diam: 2.90 cm  MITRAL VALVE                         TRICUSPID VALVE MV Area (PHT):                       TR Peak grad:   19.6 mmHg MV PHT:                              TR Vmax:        249.00 cm/s MV Decel Time: 141 msec              Estimated RAP:  3.00 mmHg MV E velocity: 78.10 cm/s  103 cm/s  RVSP:           22.6 mmHg MV A velocity: 104.00 cm/s 70.3 cm/s MV E/A ratio:  0.75  1.5       SHUNTS Systemic VTI:  0.18 m Systemic Diam: 2.00 cm   Skeet Latch MD Electronically signed by Skeet Latch MD Signature Date/Time: 07/17/2019/11:29:28 AM    Final              History of Present Illness:   Tracy [name] is a 76 y.o. female with the above problem list.  She was last seen 08/24/22. She continued to note exertional chest pain which was stable since last seen by Dr. Burt Knack. She had been to the ED with chest pain in the interim. She also notes significant shortness of breath. She also note worsening LE edema. He left leg is always larger. But, she noted recent erythma and discomfort. I adjusted her diuretics for volume excess with early f/u. I felt that if her symptoms did not improve with diuresis, we would need to consider R and L cardiac catheterization.  A follow up echocardiogram is currently pending. Her BNP was not elevated. A LLE venous duplex was neg for DVT.   She returns for f/u. She is here alone. Her L leg swelling and redness improved. Over the last few days, she has noticed some redness again in her leg. She is  chronically short of breath. She struggles to get around with a cane and has severe arthritis. She has not had chest pain since I increased her Lasix for a few days. She sleeps on an incline and has been able to reduce the angle somewhat.      Past Medical History:  Diagnosis Date   Allergic rhinitis    Allergy    Anxiety    Arthritis    Breast cancer ([place]) 04/28/2020   rigtht breast   Bronchitis    CAD (coronary artery disease)    1/19 PCI/DES to [place] for ISR, normal EF.    Cancer ([place])    hx of precancerous cells in right breast    Cataract    Cervical dysplasia    unsure of procedure, possible "burning" in her late 30s   CHF (congestive heart failure) ([place])    Echo 06/2019: EF 55-60, elevated LVEDP, normal RV SF, mild MAC, mild MR, trivial TR   Complication of anesthesia    COPD (chronic obstructive pulmonary disease) (HCC)    early    Depression    Dyspnea    with exertion    Family history of adverse reaction to anesthesia    daughter- problems wiht n/v   GERD (gastroesophageal reflux disease)    Gout    Headache(784.0)    Heart murmur    hx of years ago    Hyperlipidemia    Hypertension    Low back pain    Menopausal syndrome    Myocardial infarct [facility]) 2007   hx of   Overactive bladder    PONV (postoperative nausea and vomiting)    Sleep apnea    Current Medications: Current Meds  Medication Sig   acetaminophen (TYLENOL) 500 MG tablet Take 500-1,000 mg by mouth every 6 (six) hours as needed for headache.    albuterol (VENTOLIN HFA) 108 (90 Base) MCG/ACT inhaler Inhale 2 puffs into the lungs every 4 (four) hours as needed for wheezing.   allopurinol (ZYLOPRIM) 100 MG tablet TAKE 1 TABLET BY MOUTH EVERY DAY   amLODipine (NORVASC) 5 MG tablet TAKE 1 TABLET (5 MG TOTAL) BY MOUTH DAILY.   APPLE CIDER VINEGAR PO Take 2,400 mg by mouth daily.   ASPERCREME  LIDOCAINE EX Apply 1 application topically daily as needed (pain).   aspirin EC 81 MG tablet Take 81 mg by  mouth daily.   Cholecalciferol (VITAMIN D3) 50 MCG (2000 UT) TABS Take 4,000 Units by mouth daily.    clopidogrel (PLAVIX) 75 MG tablet TAKE 1 TABLET BY MOUTH EVERY DAY WITH BREAKFAST   Cyanocobalamin (VITAMIN B-12) 5000 MCG SUBL Place 5,000 mcg under the tongue daily.    diclofenac Sodium (VOLTAREN) 1 % GEL APPLY 2 GRAMS TOPICALLY 4 TIMES A DAY AS NEEDED   ezetimibe (ZETIA) 10 MG tablet TAKE ONE TABLET BY MOUTH DAILY   fluticasone (FLONASE) 50 MCG/ACT nasal spray SPRAY 2 SPRAYS INTO EACH NOSTRIL EVERY DAY   furosemide (LASIX) 40 MG tablet Take 40 mg by mouth daily.   guaiFENesin (MUCINEX) 600 MG 12 hr tablet Take 600 mg by mouth 2 (two) times daily as needed (congestion).   isosorbide mononitrate (IMDUR) 30 MG 24 hr tablet TAKE 1 TABLET BY MOUTH EVERY DAY   losartan (COZAAR) 100 MG tablet TAKE 1 TABLET BY MOUTH EVERY DAY   metoprolol succinate (TOPROL-XL) 100 MG 24 hr tablet TAKE 1 TABLET BY MOUTH DAILY. TAKE WITH OR IMMEDIATELY FOLLOWING A MEAL.   Misc Natural Products (TART CHERRY ADVANCED) CAPS Take 1,000 capsules by mouth daily.    nitroGLYCERIN (NITROSTAT) 0.4 MG SL tablet PLACE 1 TABLET UNDER THE TONGUE EVERY 5 MINUTES AS NEEDED FOR CHEST PAIN   oxymetazoline (AFRIN) 0.05 % nasal spray Place 1 spray into both nostrils 2 (two) times daily as needed for congestion.   pantoprazole (PROTONIX) 40 MG tablet TAKE 1 TABLET BY MOUTH EVERY DAY   potassium chloride SA (KLOR-CON M) 20 MEQ tablet Take 20 mEq by mouth 2 (two) times daily.   pravastatin (PRAVACHOL) 40 MG tablet TAKE 1 TABLET BY MOUTH EVERY DAY IN THE EVENING   Simethicone (GAS-X PO) Take 2 tablets by mouth daily as needed (gas).   triamcinolone cream (KENALOG) 0.1 % APPLY TO AFFECTED AREA TWICE A DAY   Current Facility-Administered Medications for the 09/05/22 encounter (Office Visit) with Richardson Dopp T, PA-C  Medication   0.9 %  sodium chloride infusion    Allergies:   Erythromycin and Sulfamethoxazole   Social History    Occupational History   Occupation: retired   Occupation: retired    Comment: Scientist, physiological  Tobacco Use   Smoking status: Former    Packs/day: 1.25    Years: 52.00    Total pack years: 65.00    Types: Cigarettes    Quit date: 2018    Years since quitting: 5.8   Smokeless tobacco: Never   Tobacco comments:    completely quit May of 2018; period of years she did not smoke   Vaping Use   Vaping Use: Never used  Substance and Sexual Activity   Alcohol use: Yes    Comment: seldom   Drug use: No   Sexual activity: Not Currently    Family Hx: The patient's family history includes Alzheimer's disease in her maternal uncle; Cancer in her mother; Colon cancer (age of onset: 37) in her mother; Depression in her mother; Diabetes in her daughter; Other in an other family member. There is no history of Stomach cancer. She was adopted.  Review of Systems  Constitutional: Negative for chills and fever.     EKGs/Labs/Other Test Reviewed:    EKG:  EKG is not ordered today.     Recent Labs: 06/13/2022: Hemoglobin 13.2; Platelets 239 08/24/2022: NT-Pro  BNP 241 08/31/2022: ALT 21; BUN 45; Creatinine, Ser 1.76; Potassium 5.1; Sodium 140   Recent Lipid Panel Recent Labs    08/31/22 0931  CHOL 193  TRIG 218*  HDL 42  LDLCALC 113*      Risk Assessment/Calculations/Metrics:              Physical Exam:    VS:  BP 128/80   Pulse 78   Ht _0  (1.626 m)   Wt 218 lb 9.6 oz (99.2 kg)   SpO2 94%   BMI 37.52 kg/m     Wt Readings from Last 3 Encounters:  09/05/22 218 lb 9.6 oz (99.2 kg)  08/24/22 219 lb 6.4 oz (99.5 kg)  07/27/22 218 lb (98.9 kg)    Constitutional:      Appearance: Healthy appearance. Not in distress.  Neck:     Vascular: JVD normal.  Pulmonary:     Effort: Pulmonary effort is normal.     Breath sounds: No wheezing. No rales.  Cardiovascular:     Normal rate. Regular rhythm. Normal S1. Normal S2.      Murmurs: There is no murmur.  Edema:     Peripheral edema present.    Pretibial: 2+ edema of the left pretibial area and 1+ edema of the right pretibial area. Abdominal:     Palpations: Abdomen is soft.  Skin:    General: Skin is warm and dry.  Neurological:     Mental Status: Alert and oriented to person, place and time.          ASSESSMENT & PLAN:   Chronic heart failure with preserved ejection fraction ([place]) She has multifactorial dyspnea related to obesity, deconditioning, coronary artery disease, congestive heart failure, Chronic Obstructive Pulmonary Disease. Her volume status is clinically improved and improved on exam today. Volume status is overall stable. Continue current Rx with Lasix 40 mg once daily, K+ 20 mEq twice daily. Repeat BMET today. Echocardiogram pending. At f/u, will discuss +/- SGLT2i. F/u in 3 mos.    Coronary artery disease S/p BMS to LCx in 2007 and DES to LCx in 2019. Cath in 09/2020 demonstrated a patent LCx stent, mod mLAD stenosis and a chronically occluded sept perf w R to L collaterals. Her chest pain is improved since increasing diuresis 2 weeks ago. She has not had chest pain since. At this point, I do not think we need to pursue cardiac catheterization. Continue ASA 81 mg once daily, Plavix 75 mg once daily, Imdur 30 mg once daily, Toprol XL 100 mg once daily, prn NTG, Pravastatin 40 mg once daily. F/u in 3 mos.   Pure hypercholesterolemia LDL above goal. Continue Pravastatin 40 mg once daily, Zetia 10 mg once daily. Referral pending to PharmD clinic. Pt will likely be able to be placed on Leqvio as she has good coverage for it.  Essential hypertension The patient's blood pressure is controlled on her current regimen.  Continue current therapy.    Leg edema, left Her [place] was neg for DVT. I managed her for probable cellulitis with Cephalexin. She completed the antibiotics last week. There is some redness noted to her L leg today but overall appears improved. I have asked her to keep an eye on  this. If the redness worsens or she develops more pain, she knows to contact us or primary care.              Dispo:  Return in about 3 months (around 12/06/2022) for Routine Follow  Up, w/ Dr. Burt Knack, or Richardson Dopp, PA-C.   Medication Adjustments/Labs and Tests Ordered: Current medicines are reviewed at length with the patient today.  Concerns regarding medicines are outlined above.  Tests Ordered: Orders Placed This Encounter  Procedures   Basic metabolic panel   Medication Changes: No orders of the defined types were placed in this encounter.  Signed, Richardson Dopp, PA-C  09/05/2022 12:00 PM    [place], [place], [place]  [id number] Phone: [phone]; Fax: [phone]

## 2022-09-05 ENCOUNTER — Ambulatory Visit: Payer: Medicare Other | Attending: Physician Assistant | Admitting: Physician Assistant

## 2022-09-05 ENCOUNTER — Encounter: Payer: Self-pay | Admitting: Physician Assistant

## 2022-09-05 VITALS — BP 128/80 | HR 78 | Ht 64.0 in | Wt 218.6 lb

## 2022-09-05 DIAGNOSIS — I10 Essential (primary) hypertension: Secondary | ICD-10-CM | POA: Diagnosis not present

## 2022-09-05 DIAGNOSIS — I5032 Chronic diastolic (congestive) heart failure: Secondary | ICD-10-CM | POA: Diagnosis not present

## 2022-09-05 DIAGNOSIS — E7800 Pure hypercholesterolemia, unspecified: Secondary | ICD-10-CM | POA: Insufficient documentation

## 2022-09-05 DIAGNOSIS — R600 Localized edema: Secondary | ICD-10-CM | POA: Insufficient documentation

## 2022-09-05 DIAGNOSIS — I25118 Atherosclerotic heart disease of native coronary artery with other forms of angina pectoris: Secondary | ICD-10-CM | POA: Insufficient documentation

## 2022-09-05 NOTE — Assessment & Plan Note (Signed)
S/p BMS to LCx in 2007 and DES to LCx in 2019. Cath in 09/2020 demonstrated a patent LCx stent, mod mLAD stenosis and a chronically occluded sept perf w R to L collaterals. Her chest pain is improved since increasing diuresis 2 weeks ago. She has not had chest pain since. At this point, I do not think we need to pursue cardiac catheterization. Continue ASA 81 mg once daily, Plavix 75 mg once daily, Imdur 30 mg once daily, Toprol XL 100 mg once daily, prn NTG, Pravastatin 40 mg once daily. F/u in 3 mos.

## 2022-09-05 NOTE — Patient Instructions (Signed)
Medication Instructions:  Your physician recommends that you continue on your current medications as directed. Please refer to the Current Medication list given to you today.   *If you need a refill on your cardiac medications before your next appointment, please call your pharmacy*   Lab Work: TODAY:  BMET  If you have labs (blood work) drawn today and your tests are completely normal, you will receive your results only by: [place] (if you have MyChart) OR A paper copy in the mail If you have any lab test that is abnormal or we need to change your treatment, we will call you to review the results.   Testing/Procedures: None ordered   Follow-Up: At [facility], you and your health needs are our priority.  As part of our continuing mission to provide you with exceptional heart care, we have created designated Provider Care Teams.  These Care Teams include your primary Cardiologist (physician) and Advanced Practice Providers (APPs -  Physician Assistants and Nurse Practitioners) who all work together to provide you with the care you need, when you need it.  We recommend signing up for the patient portal called "MyChart".  Sign up information is provided on this After Visit Summary.  MyChart is used to connect with patients for Virtual Visits (Telemedicine).  Patients are able to view lab/test results, encounter notes, upcoming appointments, etc.  Non-urgent messages can be sent to your provider as well.   To learn more about what you can do with MyChart, go to [email or web address].    Your next appointment:   3 month(s)  The format for your next appointment:   In Person  Provider:   Sherren Mocha, MD  or Richardson Dopp, PA-C         Other Instructions   Important Information About Sugar

## 2022-09-05 NOTE — Assessment & Plan Note (Addendum)
[name] was neg for DVT. I managed her for probable cellulitis with Cephalexin. She completed the antibiotics last week. There is some redness noted to her L leg today but overall appears improved. I have asked her to keep an eye on this. If the redness worsens or she develops more pain, she knows to contact Tracy [name] or primary care.

## 2022-09-05 NOTE — Assessment & Plan Note (Addendum)
She has multifactorial dyspnea related to obesity, deconditioning, coronary artery disease, congestive heart failure, Chronic Obstructive Pulmonary Disease. Her volume status is clinically improved and improved on exam today. Volume status is overall stable. Continue current Rx with Lasix 40 mg once daily, K+ 20 mEq twice daily. Repeat BMET today. Echocardiogram pending. At f/u, will discuss +/- SGLT2i. F/u in 3 mos.

## 2022-09-05 NOTE — Assessment & Plan Note (Signed)
LDL above goal. Continue Pravastatin 40 mg once daily, Zetia 10 mg once daily. Referral pending to PharmD clinic. Pt will likely be able to be placed on Leqvio as she has good coverage for it.

## 2022-09-05 NOTE — Assessment & Plan Note (Signed)
The patient's blood pressure is controlled on her current regimen.  Continue current therapy.   

## 2022-09-06 LAB — BASIC METABOLIC PANEL
BUN/Creatinine Ratio: 19 (ref 12–28)
BUN: 26 mg/dL (ref 8–27)
CO2: 23 mmol/L (ref 20–29)
Calcium: 9.8 mg/dL (ref 8.7–10.3)
Chloride: 104 mmol/L (ref 96–106)
Creatinine, Ser: 1.36 mg/dL — ABNORMAL HIGH (ref 0.57–1.00)
Glucose: 116 mg/dL — ABNORMAL HIGH (ref 70–99)
Potassium: 4.7 mmol/L (ref 3.5–5.2)
Sodium: 141 mmol/L (ref 134–144)
eGFR: 41 mL/min/{1.73_m2} — ABNORMAL LOW (ref >59)

## 2022-09-07 ENCOUNTER — Ambulatory Visit: Payer: Medicare Other | Admitting: Emergency Medicine

## 2022-09-12 ENCOUNTER — Other Ambulatory Visit: Payer: Self-pay | Admitting: Physician Assistant

## 2022-09-12 ENCOUNTER — Ambulatory Visit (HOSPITAL_COMMUNITY): Payer: Medicare Other | Attending: Physician Assistant

## 2022-09-12 DIAGNOSIS — R600 Localized edema: Secondary | ICD-10-CM | POA: Insufficient documentation

## 2022-09-12 DIAGNOSIS — I25118 Atherosclerotic heart disease of native coronary artery with other forms of angina pectoris: Secondary | ICD-10-CM | POA: Diagnosis not present

## 2022-09-12 DIAGNOSIS — I10 Essential (primary) hypertension: Secondary | ICD-10-CM | POA: Diagnosis not present

## 2022-09-12 DIAGNOSIS — I5033 Acute on chronic diastolic (congestive) heart failure: Secondary | ICD-10-CM | POA: Diagnosis not present

## 2022-09-12 DIAGNOSIS — M79662 Pain in left lower leg: Secondary | ICD-10-CM | POA: Diagnosis not present

## 2022-09-12 DIAGNOSIS — E7800 Pure hypercholesterolemia, unspecified: Secondary | ICD-10-CM | POA: Insufficient documentation

## 2022-09-12 DIAGNOSIS — M79661 Pain in right lower leg: Secondary | ICD-10-CM | POA: Diagnosis not present

## 2022-09-12 DIAGNOSIS — R0602 Shortness of breath: Secondary | ICD-10-CM

## 2022-09-12 LAB — ECHOCARDIOGRAM COMPLETE
Area-P 1/2: 10.99 cm2
S' Lateral: 3.5 cm

## 2022-09-13 ENCOUNTER — Encounter: Payer: Self-pay | Admitting: Physician Assistant

## 2022-09-13 DIAGNOSIS — I77810 Thoracic aortic ectasia: Secondary | ICD-10-CM | POA: Insufficient documentation

## 2022-09-13 HISTORY — DX: Thoracic aortic ectasia: I77.810

## 2022-09-15 ENCOUNTER — Other Ambulatory Visit: Payer: Self-pay | Admitting: *Deleted

## 2022-09-15 DIAGNOSIS — I77810 Thoracic aortic ectasia: Secondary | ICD-10-CM

## 2022-09-15 DIAGNOSIS — I5032 Chronic diastolic (congestive) heart failure: Secondary | ICD-10-CM

## 2022-09-15 DIAGNOSIS — I25119 Atherosclerotic heart disease of native coronary artery with unspecified angina pectoris: Secondary | ICD-10-CM

## 2022-09-19 ENCOUNTER — Encounter: Payer: Self-pay | Admitting: Internal Medicine

## 2022-09-19 ENCOUNTER — Ambulatory Visit: Payer: Medicare Other | Attending: Internal Medicine | Admitting: Internal Medicine

## 2022-09-19 VITALS — BP 126/82 | HR 78 | Ht 64.0 in | Wt 224.0 lb

## 2022-09-19 DIAGNOSIS — R600 Localized edema: Secondary | ICD-10-CM | POA: Diagnosis not present

## 2022-09-19 DIAGNOSIS — L03116 Cellulitis of left lower limb: Secondary | ICD-10-CM | POA: Diagnosis not present

## 2022-09-19 DIAGNOSIS — I25119 Atherosclerotic heart disease of native coronary artery with unspecified angina pectoris: Secondary | ICD-10-CM | POA: Diagnosis not present

## 2022-09-19 DIAGNOSIS — E785 Hyperlipidemia, unspecified: Secondary | ICD-10-CM | POA: Insufficient documentation

## 2022-09-19 MED ORDER — CEPHALEXIN 500 MG PO CAPS: 500.0000 mg | ORAL_CAPSULE | Freq: Four times a day (QID) | ORAL | 0 refills | Status: DC

## 2022-09-19 NOTE — Patient Instructions (Signed)
Medication Instructions:  START: KEFLEX- '500mg'$  EVERY 6 HOURS FOR 7 DAYS  *If you need a refill on your cardiac medications before your next appointment, please call your pharmacy*  Lab Work: None Ordered At This Time.  If you have labs (blood work) drawn today and your tests are completely normal, you will receive your results only by: Many (if you have MyChart) OR A paper copy in the mail If you have any lab test that is abnormal or we need to change your treatment, we will call you to review the results.  Testing/Procedures: None Ordered At This Time.   Follow-Up: At [facility], you and your health needs are our priority.  As part of our continuing mission to provide you with exceptional heart care, we have created designated Provider Care Teams.  These Care Teams include your primary Cardiologist (physician) and Advanced Practice Providers (APPs -  Physician Assistants and Nurse Practitioners) who all work together to provide you with the care you need, when you need it.  We recommend signing up for the patient portal called "MyChart".  Sign up information is provided on this After Visit Summary.  MyChart is used to connect with patients for Virtual Visits (Telemedicine).  Patients are able to view lab/test results, encounter notes, upcoming appointments, etc.  Non-urgent messages can be sent to your provider as well.   To learn more about what you can do with MyChart, go to [email or web address].    Your next appointment:   AS NEEDED   The format for your next appointment:   In Person  Provider:   Dr. Debara Pickett   COMPRESSION STOCKINGS- LEFT LOWER EXTREMITY- 20-53mhg- PLEASE GO TO [place] AND REFER TO PAPER GIVEN

## 2022-09-19 NOTE — Progress Notes (Signed)
LIPID CLINIC CONSULT NOTE  Chief Complaint:  Follow-up dyslipidemia  Primary Care Physician: [name], [name], MD  Primary Cardiologist:  Sherren Mocha, MD  HPI:  Tracy [name] is a 76 y.o. female who is being seen today for the evaluation of dyslipidemia, elevated liver enzyme at the request of [name], Malka So, MD.  This is a pleasant 76 year old female patient of Dr. Juliann Pulse, PA-C, who presents for evaluation and management of dyslipidemia.  She has a history of coronary artery disease with prior stents.  She had a recent repeat cardiac catheterization in December 2021, but was found to have no significant stenosis requiring any repeat intervention.  LVEF at the time was 50% with mild pulmonary hypertension noted during right heart catheterization.  She was subsequently seen in follow-up and had been found in December by her PCP to have had elevated liver enzymes.  Her atorvastatin was discontinued because of myalgias, particularly and worsening knee pain.  [name] says that those symptoms had improved significantly off the statin.  Her liver enzymes at worst were AST 88 and ALT 69.  They have since normalized.  Ultrasound demonstrated hepatic steatosis.  Subsequently she has remained on ezetimibe which she has been on for some time.  She said she recently had GU surgery and subsequently has struggled with constipation.  She also has been using a narcotic pain medicine.  06/04/2021  [name] returns today for follow-up of her dyslipidemia.  She has had progressive decline in her lipids and a very good response to pravastatin.  Total cholesterol few months ago was 214, triglycerides 183, HDL 41 and LDL 140 and now her lipid profile shows total cholesterol 154, triglycerides 120, HDL 46 and LDL 86.  Overall very good improvement, however she has reportedly developed some tremulousness.  She is noted to have a significant rest tremor in both hands today.  She is also  developed some gait instability, uses a cane and had a fall at a friend's house apparently.  She also had 1 episode of chest pain.  She underwent cardiac catheterization by Dr. Burt Knack December 2021 as above.  She took a nitroglycerin for her symptoms.  I will make Dr. Burt Knack aware of this.  I highly suspect that the statin is not the cause of her tremulousness but she should make an appointment with her primary care provider and may need a neurology referral . 09/19/2022  [name] is seen today in follow-up.  She has had an interval increase in her cholesterol from 86-113 for LDL with triglycerides of 218.  She does report compliance with her medications although has possibly been more sedentary or had more dietary changes.  He has been struggling with left lower extremity swelling and erythema.  She was seen recently by Richardson Dopp, PA-C for this.  She was placed on Keflex due to other allergies to antibiotics.  She has not worn compression stockings.  She did report some improvement after antibiotic therapy but the redness returned and the swelling persists.  PMHx:  Past Medical History:  Diagnosis Date   Allergic rhinitis    Allergy    Anxiety    Arthritis    Ascending aorta dilation ([place]) 09/13/2022   Echo 09/12/2022: EF 50-55, GR 1 DD, mildly reduced RVSF, normal PASP (RVSP 17.7), mild dilation of ascending aorta (38 mm), RAP 3   Breast cancer ([place]) 04/28/2020   rigtht breast   Bronchitis    CAD (coronary artery disease)  1/19 PCI/DES to [place] for ISR, normal EF.    Cancer ([place])    hx of precancerous cells in right breast    Cataract    Cervical dysplasia    unsure of procedure, possible "burning" in her late 77s   CHF (congestive heart failure) ([place])    Echo 06/2019: EF 55-60, elevated LVEDP, normal RV SF, mild MAC, mild MR, trivial TR   Complication of anesthesia    COPD (chronic obstructive pulmonary disease) (HCC)    early    Depression    Dyspnea    with exertion     Family history of adverse reaction to anesthesia    daughter- problems wiht n/v   GERD (gastroesophageal reflux disease)    Gout    Headache(784.0)    Heart murmur    hx of years ago    Hyperlipidemia    Hypertension    Low back pain    Menopausal syndrome    Myocardial infarct ([place]) 2007   hx of   Overactive bladder    PONV (postoperative nausea and vomiting)    Sleep apnea     Past Surgical History:  Procedure Laterality Date   ANGIOPLASTY     stent 2007   BREAST LUMPECTOMY WITH RADIOACTIVE SEED LOCALIZATION Right 04/28/2020   Procedure: RIGHT BREAST LUMPECTOMY X 2  WITH RADIOACTIVE SEED LOCALIZATION;  Surgeon: Donnie Mesa, MD;  Location: [facility];  Service: General;  Laterality: Right;  LMA   CARDIAC CATHETERIZATION     COLONOSCOPY     CORONARY STENT INTERVENTION N/A 11/06/2017   Procedure: CORONARY STENT INTERVENTION;  Surgeon: [name], MD;  Location: [facility];  Service: Cardiovascular;  Laterality: N/A;   LEFT HEART CATH AND CORONARY ANGIOGRAPHY N/A 11/06/2017   Procedure: LEFT HEART CATH AND CORONARY ANGIOGRAPHY;  Surgeon: [name], MD;  Location: [facility] [facility];  Service: Cardiovascular;  Laterality: N/A;   LEFT HEART CATH AND CORONARY ANGIOGRAPHY N/A 07/24/2019   Procedure: LEFT HEART CATH AND CORONARY ANGIOGRAPHY;  Surgeon: Burnell Blanks, MD;  Location: [place] [facility];  Service: Cardiovascular;  Laterality: N/A;   RIGHT/LEFT HEART CATH AND CORONARY ANGIOGRAPHY N/A 09/29/2020   Procedure: RIGHT/LEFT HEART CATH AND CORONARY ANGIOGRAPHY;  Surgeon: Belva Crome, MD;  Location: [place] [facility];  Service: Cardiovascular;  Laterality: N/A;   ROBOTIC ASSISTED BILATERAL SALPINGO OOPHERECTOMY Bilateral 11/10/2020   Procedure: XI ROBOTIC ASSISTED BILATERAL SALPINGO OOPHORECTOMY WITH MINI LAPAROTOMY FOR DRAINAGE;  Surgeon: Lafonda Mosses, MD;  Location: [facility];  Service: Gynecology;  Laterality: Bilateral;  MINI LAP  FIRST   TONSILLECTOMY     VULVECTOMY N/A 11/10/2020   Procedure: WIDE EXCISION VULVECTOMY;  Surgeon: Lafonda Mosses, MD;  Location: [facility];  Service: Gynecology;  Laterality: N/A;    FAMHx:  Family History  Adopted: Yes  Problem Relation Age of Onset   Colon cancer Mother 35   Cancer Mother        colon   Depression Mother        Suicide.   Alzheimer's disease Maternal Uncle    Diabetes Daughter        type 2   Other Other        patient is adopted   Stomach cancer Neg Hx     SOCHx:   reports that she quit smoking about 5 years ago. Her smoking use included cigarettes. She has a 65.00 pack-year smoking history. She has never used smokeless tobacco.  She reports current alcohol use. She reports that she does not use drugs.  ALLERGIES:  Allergies  Allergen Reactions   Erythromycin Rash    But isn't certain   Sulfamethoxazole Rash    ROS: Pertinent items noted in HPI and remainder of comprehensive ROS otherwise negative.  HOME MEDS: Current Outpatient Medications on File Prior to Visit  Medication Sig Dispense Refill   acetaminophen (TYLENOL) 500 MG tablet Take 500-1,000 mg by mouth every 6 (six) hours as needed for headache.      albuterol (VENTOLIN HFA) 108 (90 Base) MCG/ACT inhaler Inhale 2 puffs into the lungs every 4 (four) hours as needed for wheezing. 1 each 3   allopurinol (ZYLOPRIM) 100 MG tablet TAKE 1 TABLET BY MOUTH EVERY DAY 90 tablet 2   amLODipine (NORVASC) 5 MG tablet TAKE 1 TABLET (5 MG TOTAL) BY MOUTH DAILY. 90 tablet 3   APPLE CIDER VINEGAR PO Take 2,400 mg by mouth daily.     ASPERCREME LIDOCAINE EX Apply 1 application topically daily as needed (pain).     aspirin EC 81 MG tablet Take 81 mg by mouth daily.     Cholecalciferol (VITAMIN D3) 50 MCG (2000 UT) TABS Take 4,000 Units by mouth daily.      clopidogrel (PLAVIX) 75 MG tablet TAKE 1 TABLET BY MOUTH EVERY DAY WITH BREAKFAST 90 tablet 2   Cyanocobalamin (VITAMIN B-12) 5000 MCG SUBL Place 5,000  mcg under the tongue daily.      diclofenac Sodium (VOLTAREN) 1 % GEL APPLY 2 GRAMS TOPICALLY 4 TIMES A DAY AS NEEDED 100 g 2   ezetimibe (ZETIA) 10 MG tablet TAKE ONE TABLET BY MOUTH DAILY 90 tablet 3   fluticasone (FLONASE) 50 MCG/ACT nasal spray SPRAY 2 SPRAYS INTO EACH NOSTRIL EVERY DAY 48 mL 1   furosemide (LASIX) 40 MG tablet Take 40 mg by mouth daily.     guaiFENesin (MUCINEX) 600 MG 12 hr tablet Take 600 mg by mouth 2 (two) times daily as needed (congestion).     isosorbide mononitrate (IMDUR) 30 MG 24 hr tablet TAKE 1 TABLET BY MOUTH EVERY DAY 90 tablet 3   losartan (COZAAR) 100 MG tablet TAKE 1 TABLET BY MOUTH EVERY DAY 90 tablet 3   metoprolol succinate (TOPROL-XL) 100 MG 24 hr tablet TAKE 1 TABLET BY MOUTH DAILY. TAKE WITH OR IMMEDIATELY FOLLOWING A MEAL. 90 tablet 3   Misc Natural Products (TART CHERRY ADVANCED) CAPS Take 1,000 capsules by mouth daily.      nitroGLYCERIN (NITROSTAT) 0.4 MG SL tablet PLACE 1 TABLET UNDER THE TONGUE EVERY 5 MINUTES AS NEEDED FOR CHEST PAIN 25 tablet 6   oxymetazoline (AFRIN) 0.05 % nasal spray Place 1 spray into both nostrils 2 (two) times daily as needed for congestion.     pantoprazole (PROTONIX) 40 MG tablet TAKE 1 TABLET BY MOUTH EVERY DAY 90 tablet 2   potassium chloride SA (KLOR-CON M) 20 MEQ tablet Take 20 mEq by mouth 2 (two) times daily.     pravastatin (PRAVACHOL) 40 MG tablet TAKE 1 TABLET BY MOUTH EVERY DAY IN THE EVENING 90 tablet 3   Simethicone (GAS-X PO) Take 2 tablets by mouth daily as needed (gas).     triamcinolone cream (KENALOG) 0.1 % APPLY TO AFFECTED AREA TWICE A DAY 30 g 1   Current Facility-Administered Medications on File Prior to Visit  Medication Dose Route Frequency Provider Last Rate Last Admin   0.9 %  sodium chloride infusion  500 mL Intravenous  Once Irene Shipper, MD        LABS/IMAGING: No results found for this or any previous visit (from the past 48 hour(s)).  No results found.  LIPID PANEL:    Component  Value Date/Time   CHOL 193 08/31/2022 0931   TRIG 218 (H) 08/31/2022 0931   HDL 42 08/31/2022 0931   CHOLHDL 4.6 (H) 08/31/2022 0931   CHOLHDL 5.0 (H) 09/13/2016 0738   VLDL 35 (H) 09/13/2016 0738   LDLCALC 113 (H) 08/31/2022 0931   LDLDIRECT 111.0 01/05/2015 1019    WEIGHTS: Wt Readings from Last 3 Encounters:  09/19/22 224 lb (101.6 kg)  09/05/22 218 lb 9.6 oz (99.2 kg)  08/24/22 219 lb 6.4 oz (99.5 kg)    VITALS: BP 126/82 (BP Location: Left Arm, Patient Position: Sitting, Cuff Size: Large)   Pulse 78   Ht _0  (1.626 m)   Wt 224 lb (101.6 kg)   SpO2 95%   BMI 38.45 kg/m   EXAM: Extremities: edema 3+ left lower, venous stasis dermatitis noted, and erythema with warmth and blistering Skin: Left lower extremity erythema, warmth and blistering  EKG: Deferred  ASSESSMENT: Probable left lower leg venous insufficiency/lymphedema, possible overlying cellulitis CAD with prior PCI Mixed dyslipidemia, goal LDL less than 70 Atorvastatin intolerance-myalgias, elevated liver enzymes Hepatic steatosis  PLAN: 1.   Ms. Boateng has had a recent increase in her LDL from 86-113.  She has been less active and struggling with left leg swelling and possible cellulitis.  She had a course of Keflex which improved it however her symptoms have returned.  She has allergies to several other medications.  I will go ahead and represcribe her some Keflex.  In addition she needs to elevate her foot and use compression stockings.  We referred her to elastic therapy in , to be fitted with compression stocking.  Discussed the possibility of adding Nexletol or other medications for her cholesterol which is above target but she does not want to try additional medicines at this time.  She can follow-up with me as needed for this and with her general cardiologist or PCP regarding her swelling.  [name], MD, [facility], [place] Director of the Advanced Lipid  Disorders &  Cardiovascular Risk Reduction Clinic Diplomate of the American Board of Clinical Lipidology Attending Cardiologist  Direct Dial: [phone]  Fax: [phone]  Website:  [email or web address] 09/19/2022, 10:03 AM

## 2022-09-26 ENCOUNTER — Other Ambulatory Visit: Payer: Self-pay | Admitting: Cardiovascular Disease

## 2022-10-05 ENCOUNTER — Ambulatory Visit (INDEPENDENT_AMBULATORY_CARE_PROVIDER_SITE_OTHER): Payer: Medicare Other | Admitting: Emergency Medicine

## 2022-10-05 ENCOUNTER — Encounter: Payer: Self-pay | Admitting: Emergency Medicine

## 2022-10-05 VITALS — BP 134/78 | HR 89 | Temp 98.7°F | Ht 65.0 in | Wt 224.8 lb

## 2022-10-05 DIAGNOSIS — I5032 Chronic diastolic (congestive) heart failure: Secondary | ICD-10-CM | POA: Diagnosis not present

## 2022-10-05 DIAGNOSIS — I25119 Atherosclerotic heart disease of native coronary artery with unspecified angina pectoris: Secondary | ICD-10-CM | POA: Diagnosis not present

## 2022-10-05 DIAGNOSIS — J449 Chronic obstructive pulmonary disease, unspecified: Secondary | ICD-10-CM

## 2022-10-05 MED ORDER — ALBUTEROL SULFATE HFA 108 (90 BASE) MCG/ACT IN AERS: 2.0000 | INHALATION_SPRAY | RESPIRATORY_TRACT | 3 refills | Status: DC | PRN

## 2022-10-05 NOTE — Addendum Note (Signed)
Addended by: Gavin Potters R on: 10/05/2022 10:03 AM   Modules accepted: Orders

## 2022-10-05 NOTE — Progress Notes (Signed)
Subjective:    Patient ID: Tracy [name], female    DOB: 1946/01/15, 76 y.o.   MRN: [id number]  Shortness of Breath Associated symptoms include headaches. Pertinent negatives include no ear pain, fever, leg swelling, rash, rhinorrhea, sore throat, vomiting or wheezing.   ROV 11/06/20 --pleasant 76 year old woman with a history of hypertension, diastolic CHF, CAD with combined restrictive and obstructive lung disease consistent with COPD.  She has been managed on Anoro but misses it some days, has albuterol which she uses approximately 2x a week.   She is under evaluation for a complex abdominopelvic mass identified by CT.  She is planning for abdominal surgery, probably BSO, possible TAH.  She presents today for preop surgical risk assessment. She reports that her breathing has been overall stable, still has exertional SOB with walking. Occasional cough, minimal mucous. No wheeze. Her last flare when she needed prednisone was a year ago. COVID vaccine is up to date, flu shot is up to date.   Most recent pulmonary function testing from 05/25/2018 reviewed and are consistent with mixed obstruction and restriction with a moderately reduced FEV1 of 74% predicted.  ROV 10/05/22 --follow-up visit 76 year old woman.  She has a history of hypertension, diastolic CHF, CAD with combined restrictive and obstructive lung disease consistent with COPD.  We have treated her with Anoro in the past, not on any maintenance BD currently. She has albuterol, uses it about 2-3x a week. She has some increased LE edema for several months. Is having some increased exertional SOB. She has more difficulty during weather extremes. No cough.    Review of Systems  Constitutional:  Positive for unexpected weight change. Negative for fever.  HENT:  Negative for congestion, dental problem, ear pain, nosebleeds, postnasal drip, rhinorrhea, sinus pressure, sneezing, sore throat and trouble swallowing.   Eyes:  Negative for  redness and itching.  Respiratory:  Positive for cough and shortness of breath. Negative for chest tightness and wheezing.   Cardiovascular:  Negative for palpitations and leg swelling.  Gastrointestinal:  Negative for nausea and vomiting.  Genitourinary:  Negative for dysuria.  Musculoskeletal:  Positive for joint swelling.  Skin:  Negative for rash.  Neurological:  Positive for headaches.  Hematological:  Does not bruise/bleed easily.  Psychiatric/Behavioral:  Negative for dysphoric mood. The patient is not nervous/anxious.        Objective:   Physical Exam Vitals:   10/05/22 0843  BP: 134/78  Pulse: 89  Temp: 98.7 F (37.1 C)  TempSrc: Oral  SpO2: 97%  Weight: 224 lb 12.8 oz (102 kg)  Height: '5\' 5"'$  (1.651 m)   Gen: Pleasant, overwt, in no distress,  normal affect, no cough  ENT: No lesions,  mouth clear,  oropharynx clear, no postnasal drip  Neck: No JVD, no stridor  Lungs: No use of accessory muscles, decreased at bases, no crackles or wheezes.   Cardiovascular: RRR, heart sounds normal, no murmur, 2-3+ LE peripheral edema with some blistering and erythema esp on the L  Musculoskeletal: No deformities, no cyanosis or clubbing  Neuro: alert, non focal  Skin: Warm, no lesions or rash     Assessment & Plan:  COPD (chronic obstructive pulmonary disease) ([place]) She does have exertional shortness of breath.  No other significant symptoms, no cough or sputum.  She has benefited from scheduled BD in the past, would likely benefit currently.  Unfortunately these have not been affordable.  She does not have prescription drug coverage, does not qualify for financial  assistance.  For now she does not want [place] to order.  I will refill her albuterol which she uses a few times a week.  Asked her to update me if there is a change in her insurance coverage so I can start LABA/LAMA whenever possible.  We will refill your albuterol today.  Keep it available to use 2 puffs when needed  for shortness of breath, chest tightness, wheezing. You would benefit from being on an every day scheduled inhaler medication.  I would like to try to start this in the future if your insurance coverage changes and the medication becomes affordable.  Please call us if there is a change in your insurance status. Keep your flu shot up-to-date Follow with Dr. Lamonte Sakai in 12 months or sooner if you have any problems.   Chronic heart failure with preserved ejection fraction (HCC) Continue to follow with cardiology regarding your fluid status and diuretics.  Baltazar Apo, MD, PhD 10/05/2022, 9:02 AM [facility] [phone] or if no answer [phone]

## 2022-10-05 NOTE — Assessment & Plan Note (Signed)
She does have exertional shortness of breath.  No other significant symptoms, no cough or sputum.  She has benefited from scheduled BD in the past, would likely benefit currently.  Unfortunately these have not been affordable.  She does not have prescription drug coverage, does not qualify for financial assistance.  For now she does not want [place] to order.  I will refill her albuterol which she uses a few times a week.  Asked her to update me if there is a change in her insurance coverage so I can start LABA/LAMA whenever possible.  We will refill your albuterol today.  Keep it available to use 2 puffs when needed for shortness of breath, chest tightness, wheezing. You would benefit from being on an every day scheduled inhaler medication.  I would like to try to start this in the future if your insurance coverage changes and the medication becomes affordable.  Please call us if there is a change in your insurance status. Keep your flu shot up-to-date Follow with Dr. Lamonte Sakai in 12 months or sooner if you have any problems.

## 2022-10-05 NOTE — Patient Instructions (Signed)
We will refill your albuterol today.  Keep it available to use 2 puffs when needed for shortness of breath, chest tightness, wheezing. You would benefit from being on an every day scheduled inhaler medication.  I would like to try to start this in the future if your insurance coverage changes and the medication becomes affordable.  Please call us if there is a change in your insurance status. Keep your flu shot up-to-date Continue to follow with cardiology regarding your fluid status and diuretics. Follow with Dr. Lamonte Sakai in 12 months or sooner if you have any problems.

## 2022-10-05 NOTE — Assessment & Plan Note (Signed)
Continue to follow with cardiology regarding your fluid status and diuretics.

## 2022-10-11 ENCOUNTER — Emergency Department (HOSPITAL_COMMUNITY)
Admission: EM | Admit: 2022-10-11 | Discharge: 2022-10-11 | Disposition: A | Discharge status: Home / Self Care | Payer: Medicare Other | Attending: Emergency Medicine | Admitting: Emergency Medicine

## 2022-10-11 ENCOUNTER — Emergency Department (HOSPITAL_COMMUNITY): Payer: Medicare Other

## 2022-10-11 ENCOUNTER — Encounter (HOSPITAL_COMMUNITY): Payer: Self-pay

## 2022-10-11 DIAGNOSIS — R059 Cough, unspecified: Secondary | ICD-10-CM | POA: Diagnosis not present

## 2022-10-11 DIAGNOSIS — U071 COVID-19: Secondary | ICD-10-CM | POA: Insufficient documentation

## 2022-10-11 DIAGNOSIS — Z7982 Long term (current) use of aspirin: Secondary | ICD-10-CM | POA: Diagnosis not present

## 2022-10-11 DIAGNOSIS — R0602 Shortness of breath: Secondary | ICD-10-CM | POA: Diagnosis not present

## 2022-10-11 DIAGNOSIS — Z79899 Other long term (current) drug therapy: Secondary | ICD-10-CM | POA: Insufficient documentation

## 2022-10-11 DIAGNOSIS — R0902 Hypoxemia: Secondary | ICD-10-CM | POA: Diagnosis not present

## 2022-10-11 DIAGNOSIS — G4489 Other headache syndrome: Secondary | ICD-10-CM | POA: Diagnosis not present

## 2022-10-11 DIAGNOSIS — I10 Essential (primary) hypertension: Secondary | ICD-10-CM | POA: Diagnosis not present

## 2022-10-11 DIAGNOSIS — R5383 Other fatigue: Secondary | ICD-10-CM | POA: Diagnosis present

## 2022-10-11 DIAGNOSIS — R531 Weakness: Secondary | ICD-10-CM | POA: Diagnosis not present

## 2022-10-11 LAB — CBC WITH DIFFERENTIAL/PLATELET
Abs Immature Granulocytes: 0.05 10*3/uL (ref 0.00–0.07)
Basophils Absolute: 0 10*3/uL (ref 0.0–0.1)
Basophils Relative: 0 %
Eosinophils Absolute: 0 10*3/uL (ref 0.0–0.5)
Eosinophils Relative: 0 %
HCT: 39.3 % (ref 36.0–46.0)
Hemoglobin: 12 g/dL (ref 12.0–15.0)
Immature Granulocytes: 1 %
Lymphocytes Relative: 19 %
Lymphs Abs: 2 10*3/uL (ref 0.7–4.0)
MCH: 28.1 pg (ref 26.0–34.0)
MCHC: 30.5 g/dL (ref 30.0–36.0)
MCV: 92 fL (ref 80.0–100.0)
Monocytes Absolute: 0.8 10*3/uL (ref 0.1–1.0)
Monocytes Relative: 8 %
Neutro Abs: 7.6 10*3/uL (ref 1.7–7.7)
Neutrophils Relative %: 72 %
Platelets: 214 10*3/uL (ref 150–400)
RBC: 4.27 MIL/uL (ref 3.87–5.11)
RDW: 15.9 % — ABNORMAL HIGH (ref 11.5–15.5)
WBC: 10.4 10*3/uL (ref 4.0–10.5)
nRBC: 0 % (ref 0.0–0.2)

## 2022-10-11 LAB — HEPATIC FUNCTION PANEL
ALT: 25 U/L (ref 0–44)
AST: 34 U/L (ref 15–41)
Albumin: 3 g/dL — ABNORMAL LOW (ref 3.5–5.0)
Alkaline Phosphatase: 62 U/L (ref 38–126)
Bilirubin, Direct: 0.1 mg/dL (ref 0.0–0.2)
Indirect Bilirubin: 0.4 mg/dL (ref 0.3–0.9)
Total Bilirubin: 0.5 mg/dL (ref 0.3–1.2)
Total Protein: 7.4 g/dL (ref 6.5–8.1)

## 2022-10-11 LAB — RESP PANEL BY RT-PCR (RSV, FLU A&B, COVID)  RVPGX2
Influenza A by PCR: NEGATIVE
Influenza B by PCR: NEGATIVE
Resp Syncytial Virus by PCR: NEGATIVE
SARS Coronavirus 2 by RT PCR: POSITIVE — AB

## 2022-10-11 LAB — BASIC METABOLIC PANEL
Anion gap: 11 (ref 5–15)
BUN: 34 mg/dL — ABNORMAL HIGH (ref 8–23)
CO2: 20 mmol/L — ABNORMAL LOW (ref 22–32)
Calcium: 9.1 mg/dL (ref 8.9–10.3)
Chloride: 110 mmol/L (ref 98–111)
Creatinine, Ser: 1.52 mg/dL — ABNORMAL HIGH (ref 0.44–1.00)
GFR, Estimated: 36 mL/min — ABNORMAL LOW (ref >60)
Glucose, Bld: 114 mg/dL — ABNORMAL HIGH (ref 70–99)
Potassium: 5 mmol/L (ref 3.5–5.1)
Sodium: 141 mmol/L (ref 135–145)

## 2022-10-11 MED ORDER — BENZONATATE 100 MG PO CAPS: 100.0000 mg | ORAL_CAPSULE | Freq: Three times a day (TID) | ORAL | 0 refills | Status: DC

## 2022-10-11 MED ORDER — ONDANSETRON 4 MG PO TBDP: ORAL_TABLET | ORAL | 0 refills | Status: DC

## 2022-10-11 MED ORDER — MORPHINE SULFATE (PF) 2 MG/ML IV SOLN
2.0000 mg | Freq: Once | INTRAVENOUS | Status: AC
  Administered 2022-10-11: 2 mg via INTRAVENOUS
  Filled 2022-10-11: qty 1

## 2022-10-11 MED ORDER — ONDANSETRON HCL 4 MG/2ML IJ SOLN
4.0000 mg | Freq: Once | INTRAMUSCULAR | Status: AC
  Administered 2022-10-11: 4 mg via INTRAVENOUS
  Filled 2022-10-11: qty 2

## 2022-10-11 MED ORDER — SODIUM CHLORIDE 0.9 % IV BOLUS
1000.0000 mL | Freq: Once | INTRAVENOUS | Status: AC
  Administered 2022-10-11: 1000 mL via INTRAVENOUS

## 2022-10-11 NOTE — ED Provider Notes (Signed)
[facility] DEPT Provider Note   CSN: [id number] Arrival date & time: 10/11/22  1226     History  Chief Complaint  Patient presents with   Covid Positive    Tracy [name] is a 76 y.o. female.  76 yo F with a chief complaint of fatigue cough diarrhea nausea.  This been going on for couple days now.  Started with a headache.  Took a home COVID test that was positive.  No known sick contacts.  Lives at home with her husband.  Has become progressively fatigued at home.          Home Medications Prior to Admission medications   Medication Sig Start Date End Date Taking? Authorizing Provider  benzonatate (TESSALON) 100 MG capsule Take 1 capsule (100 mg total) by mouth every 8 (eight) hours. 10/11/22  Yes Deno Etienne, DO  ondansetron (ZOFRAN-ODT) 4 MG disintegrating tablet '4mg'$  ODT q4 hours prn nausea/vomit 10/11/22  Yes Deno Etienne, DO  acetaminophen (TYLENOL) 500 MG tablet Take 500-1,000 mg by mouth every 6 (six) hours as needed for headache.     [provider]  albuterol (VENTOLIN HFA) 108 (90 Base) MCG/ACT inhaler Inhale 2 puffs into the lungs every 4 (four) hours as needed for wheezing. 10/05/22   Collene Gobble, MD  allopurinol (ZYLOPRIM) 100 MG tablet TAKE 1 TABLET BY MOUTH EVERY DAY 04/18/22   [name], [name], MD  amLODipine (NORVASC) 5 MG tablet TAKE 1 TABLET (5 MG TOTAL) BY MOUTH DAILY. 09/26/22   Sherren Mocha, MD  APPLE CIDER VINEGAR PO Take 2,400 mg by mouth daily.    [provider]  ASPERCREME LIDOCAINE EX Apply 1 application topically daily as needed (pain).    [provider]  aspirin EC 81 MG tablet Take 81 mg by mouth daily.    [provider]  cephALEXin (KEFLEX) 500 MG capsule Take 1 capsule (500 mg total) by mouth 4 (four) times daily. 09/19/22   Hilty, Nadean Corwin, MD  Cholecalciferol (VITAMIN D3) 50 MCG (2000 UT) TABS Take 4,000 Units by mouth daily.     [provider]  clopidogrel  (PLAVIX) 75 MG tablet TAKE 1 TABLET BY MOUTH EVERY DAY WITH BREAKFAST 06/13/22   Sherren Mocha, MD  Cyanocobalamin (VITAMIN B-12) 5000 MCG SUBL Place 5,000 mcg under the tongue daily.     [provider]  diclofenac Sodium (VOLTAREN) 1 % GEL APPLY 2 GRAMS TOPICALLY 4 TIMES A DAY AS NEEDED 08/29/22   [name], [name], MD  ezetimibe (ZETIA) 10 MG tablet TAKE ONE TABLET BY MOUTH DAILY 02/23/22   Sherren Mocha, MD  fluticasone [facility]) 50 MCG/ACT nasal spray SPRAY 2 SPRAYS INTO EACH NOSTRIL EVERY DAY 02/20/20   Collene Gobble, MD  furosemide (LASIX) 40 MG tablet Take 40 mg by mouth daily.    [provider]  guaiFENesin (MUCINEX) 600 MG 12 hr tablet Take 600 mg by mouth 2 (two) times daily as needed (congestion).    [provider]  isosorbide mononitrate (IMDUR) 30 MG 24 hr tablet TAKE 1 TABLET BY MOUTH EVERY DAY 05/02/22   Sherren Mocha, MD  losartan (COZAAR) 100 MG tablet TAKE 1 TABLET BY MOUTH EVERY DAY 07/15/22   [name], [name], MD  metoprolol succinate (TOPROL-XL) 100 MG 24 hr tablet TAKE 1 TABLET BY MOUTH DAILY. TAKE WITH OR IMMEDIATELY FOLLOWING A MEAL. 02/14/22   Sherren Mocha, MD  Misc Natural Products [facility] ADVANCED) CAPS Take 1,000 capsules by mouth  daily.     [provider]  nitroGLYCERIN (NITROSTAT) 0.4 MG SL tablet PLACE 1 TABLET UNDER THE TONGUE EVERY 5 MINUTES AS NEEDED FOR CHEST PAIN 05/17/22   Sherren Mocha, MD  oxymetazoline (AFRIN) 0.05 % nasal spray Place 1 spray into both nostrils 2 (two) times daily as needed for congestion.    [provider]  pantoprazole (PROTONIX) 40 MG tablet TAKE 1 TABLET BY MOUTH EVERY DAY 01/28/22   [name], [name], MD  potassium chloride SA (KLOR-CON M) 20 MEQ tablet Take 20 mEq by mouth 2 (two) times daily.    [provider]  pravastatin (PRAVACHOL) 40 MG tablet TAKE 1 TABLET BY MOUTH EVERY DAY IN THE EVENING 03/15/22   Sherren Mocha, MD  Simethicone (GAS-X PO) Take 2 tablets by mouth daily  as needed (gas).    [provider]  triamcinolone cream (KENALOG) 0.1 % APPLY TO AFFECTED AREA TWICE A DAY 09/05/22   [name], [name], MD      Allergies    Erythromycin and Sulfamethoxazole    Review of Systems   Review of Systems  Physical Exam Updated Vital Signs BP 117/70 (BP Location: Right Arm)   Pulse 80   Temp 98.2 F (36.8 C) (Oral)   Resp 18   SpO2 97%  Physical Exam Vitals and nursing note reviewed.  Constitutional:      General: She is not in acute distress.    Appearance: She is well-developed. She is not diaphoretic.  HENT:     Head: Normocephalic and atraumatic.  Eyes:     Pupils: Pupils are equal, round, and reactive to light.  Cardiovascular:     Rate and Rhythm: Normal rate and regular rhythm.     Heart sounds: No murmur heard.    No friction rub. No gallop.  Pulmonary:     Effort: Pulmonary effort is normal.     Breath sounds: No wheezing or rales.  Abdominal:     General: There is no distension.     Palpations: Abdomen is soft.     Tenderness: There is no abdominal tenderness.  Musculoskeletal:        General: No tenderness.     Cervical back: Normal range of motion and neck supple.  Skin:    General: Skin is warm and dry.  Neurological:     Mental Status: She is alert and oriented to person, place, and time.  Psychiatric:        Behavior: Behavior normal.     ED Results / Procedures / Treatments   Labs (all labs ordered are listed, but only abnormal results are displayed) Labs Reviewed  RESP PANEL BY RT-PCR (RSV, FLU A&B, COVID)  RVPGX2 - Abnormal; Notable for the following components:      Result Value   SARS Coronavirus 2 by RT PCR POSITIVE (*)    All other components within normal limits  BASIC METABOLIC PANEL - Abnormal; Notable for the following components:   CO2 20 (*)    Glucose, Bld 114 (*)    BUN 34 (*)    Creatinine, Ser 1.52 (*)    GFR, Estimated 36 (*)    All other components within normal limits  CBC WITH  DIFFERENTIAL/PLATELET - Abnormal; Notable for the following components:   RDW 15.9 (*)    All other components within normal limits  HEPATIC FUNCTION PANEL - Abnormal; Notable for the following components:   Albumin 3.0 (*)    All other components within normal  limits    EKG EKG Interpretation  Date/Time:  Tuesday October 11 2022 13:40:36 EST Ventricular Rate:  89 PR Interval:  157 QRS Duration: 99 QT Interval:  377 QTC Calculation: 459 R Axis:   -32 Text Interpretation: Sinus rhythm Left axis deviation Low voltage, precordial leads No significant change since last tracing Confirmed by Deno Etienne [phone]) on 10/11/2022 3:13:33 PM  Radiology DG Chest 2 View  Result Date: 10/11/2022 CLINICAL DATA:  Shortness of breath EXAM: CHEST - 2 VIEW COMPARISON:  06/13/2022 FINDINGS: The heart size and mediastinal contours are within normal limits. Aortic atherosclerosis. No focal airspace consolidation, pleural effusion, or pneumothorax. The visualized skeletal structures are unremarkable. IMPRESSION: No active cardiopulmonary disease. Electronically Signed   By: Davina Poke D.O.   On: 10/11/2022 14:32    Procedures Procedures    Medications Ordered in ED Medications  sodium chloride 0.9 % bolus 1,000 mL (1,000 mLs Intravenous New Bag/Given 10/11/22 1554)  morphine (PF) 2 MG/ML injection 2 mg (2 mg Intravenous Given 10/11/22 1553)  ondansetron (ZOFRAN) injection 4 mg (4 mg Intravenous Given 10/11/22 1553)    ED Course/ Medical Decision Making/ A&P                           Medical Decision Making Amount and/or Complexity of Data Reviewed Labs: ordered.  Risk Prescription drug management.   76 yo F with a chief complaints of cough congestion fevers chills myalgias headache nausea diarrhea going on for couple days now.  Patient hypotensive on arrival.  Improved into the 1 teens without intervention.  Will give a bolus of IV fluids blood work chest x-ray reassess.  Chest x-ray  independently interpreted by me without focal infiltrate or pneumothorax.  Lab work is resulted without significant bump in her renal function, LFTs unremarkable no leukocytosis no anemia.  Patient feels a bit better after fluid bolus and antiemetics.  Will give an oral trial here.  Attempt to ambulate.  Eat and drank here without issue. Ambulated without hypoxia.  Feeling better would like to try and go home.    PCP follow up.  6:22 PM:  I have discussed the diagnosis/risks/treatment options with the patient.  Evaluation and diagnostic testing in the emergency department does not suggest an emergent condition requiring admission or immediate intervention beyond what has been performed at this time.  They will follow up with PCP. We also discussed returning to the ED immediately if new or worsening sx occur. We discussed the sx which are most concerning (e.g., sudden worsening pain, fever, inability to tolerate by mouth) that necessitate immediate return. Medications administered to the patient during their visit and any new prescriptions provided to the patient are listed below.  Medications given during this visit Medications  sodium chloride 0.9 % bolus 1,000 mL (1,000 mLs Intravenous New Bag/Given 10/11/22 1554)  morphine (PF) 2 MG/ML injection 2 mg (2 mg Intravenous Given 10/11/22 1553)  ondansetron (ZOFRAN) injection 4 mg (4 mg Intravenous Given 10/11/22 1553)     The patient appears reasonably screen and/or stabilized for discharge and I doubt any other medical condition or other [facility] requiring further screening, evaluation, or treatment in the ED at this time prior to discharge.          Final Clinical Impression(s) / ED Diagnoses Final diagnoses:  COVID-19 virus infection    Rx / DC Orders ED Discharge Orders          Ordered  ondansetron (ZOFRAN-ODT) 4 MG disintegrating tablet        10/11/22 1820    benzonatate (TESSALON) 100 MG capsule  Every 8 hours         10/11/22 1820              Deno Etienne, DO 10/11/22 Vern[facility] Emerald

## 2022-10-11 NOTE — ED Provider Triage Note (Signed)
Emergency Medicine Provider Triage Evaluation Note  KADIAN BARCELLOS , a 76 y.o. female  was evaluated in triage.  Pt complains of generalized weakness, headache, cough.  States symptoms began yesterday.  She tested positive for COVID at home today prior to arrival.  States she is unable to walk due to the weakness.  Cough is sometimes productive of yellow sputum.  States she had a fever of 101 Fahrenheit today  Review of Systems  Positive: As above Negative: As above  Physical Exam  BP (!) 73/50 (BP Location: Left Arm)   Pulse 86   Temp 98.2 F (36.8 C) (Oral)   Resp 16   SpO2 91%  Gen:   Awake, no distress  Resp:  Normal effort, distant lung sounds MSK:   Moves extremities without difficulty  Other:    Medical Decision Making  Medically screening exam initiated at 2:12 PM.  Appropriate orders placed.  Davia L Barbier was informed that the remainder of the evaluation will be completed by another provider, this initial triage assessment does not replace that evaluation, and the importance of remaining in the ED until their evaluation is complete.    Roylene Reason, [place] 10/11/22 1413

## 2022-10-11 NOTE — ED Triage Notes (Signed)
Pt arrived via EMS, from home, generalized weakness. Headache, cough. COVID positive this morning.

## 2022-10-11 NOTE — Discharge Instructions (Signed)
Take tylenol 2 pills 4 times a day and motrin 4 pills 3 times a day.  Drink plenty of fluids.  Return for worsening shortness of breath, headache, confusion, inability to eat or drink. Follow up with your family doctor.

## 2022-10-19 ENCOUNTER — Encounter: Payer: Self-pay | Admitting: Family Medicine

## 2022-10-19 ENCOUNTER — Ambulatory Visit (INDEPENDENT_AMBULATORY_CARE_PROVIDER_SITE_OTHER): Payer: Medicare Other | Admitting: Family Medicine

## 2022-10-19 VITALS — BP 128/80 | HR 87 | Temp 97.4°F | Wt 222.8 lb

## 2022-10-19 DIAGNOSIS — L03116 Cellulitis of left lower limb: Secondary | ICD-10-CM | POA: Diagnosis not present

## 2022-10-19 DIAGNOSIS — R600 Localized edema: Secondary | ICD-10-CM

## 2022-10-19 MED ORDER — DOXYCYCLINE HYCLATE 100 MG PO CAPS: 100.0000 mg | ORAL_CAPSULE | Freq: Two times a day (BID) | ORAL | 0 refills | Status: DC

## 2022-10-19 MED ORDER — CEPHALEXIN 500 MG PO CAPS: 500.0000 mg | ORAL_CAPSULE | Freq: Four times a day (QID) | ORAL | 0 refills | Status: DC

## 2022-10-19 NOTE — Progress Notes (Signed)
   Subjective:    Patient ID: Tracy [name], female    DOB: Jun 07, 1946, 77 y.o.   MRN: [id number]  HPI Here for persistent cellulitis on the left lower leg. Her legs are chronically swollen, and the left lower leg began to have a red color and feel warm about 2 weeks ago. Now she has some mild pain in the leg. No fevers. A venous doppler of the leg on 08-25-22 was negative for any DVT. When she saw her cardiologist, Dr. Debara Pickett, on 09-19-22 he gave her 7 days of Cephalexin. This helped a little, but then the pain came back.    Review of Systems  Constitutional: Negative.   Respiratory:  Negative for shortness of breath and wheezing.   Cardiovascular:  Positive for leg swelling. Negative for chest pain and palpitations.  Skin:  Positive for color change.       Objective:   Physical Exam Constitutional:      General: She is not in acute distress.    Appearance: She is obese.     Comments: In a wheelchair   Cardiovascular:     Rate and Rhythm: Normal rate and regular rhythm.     Pulses: Normal pulses.     Heart sounds: Normal heart sounds.  Pulmonary:     Effort: Pulmonary effort is normal.     Breath sounds: Normal breath sounds.  Musculoskeletal:     Comments: 3+ edema in both lower legs. The left lower leg has an area of erythema, warmth, and tenderness   Neurological:     Mental Status: She is alert.           Assessment & Plan:  Cellulitis of the left lower leg. We will treat this with Cephalexin 500 mg QID and with Doxycycline 100 mg BID for 14 days each. She will follow up with Dr. [name], her PCP, on 11-28-22.  Alysia Penna, MD

## 2022-10-25 NOTE — Progress Notes (Signed)
ACUTE VISIT Chief Complaint  Patient presents with   Diarrhea    X 3 days   Herpes Zoster    On breast   HPI: [name] is a 77 y.o. female with PMHx significant for CAD, hypertension, hyperlipidemia, generalized OA, MR, COPD, CKD 3, and anxiety  here today complaining of diarrhea as described above.  She has had diarrhea for approximately one week or so, with an average of five to six stools per day. The diarrhea is described as soft and loose, sometimes liquid.  She reports having COVID 19 infection over Christmas and has experienced some nausea as well as diarrhea. Diarrhea  This is a new problem. The current episode started 1 to 4 weeks ago. The patient states that diarrhea does not awaken her from sleep. Associated symptoms include arthralgias. Pertinent negatives include no abdominal pain, bloating, chills, fever, headaches, increased  flatus, myalgias, sweats, URI or vomiting. There are no known risk factors. She has tried bismuth subsalicylate for the symptoms.   She c/o lower back pain, neck pain, and arm pain. She has a history of generalized OA.  She has been taking cephalexin 500 mg qid for LLE cellulitis and reports that the diarrhea has worsened since starting the antibiotic, 10/19/22.  She denies associated fever and has not observed any blood in the stool or melena. Her husband also had diarrhea during his COVID infection but has since recovered.  Pepto bismol has helped.  Her appetite is reported as normal, but she experiences worsened diarrhea when eating. She is still coughing some from her COVID infection but reports that most of the symptoms have improved.  She states that she has received three COVID vaccines but has not received the most recent one.  CKD III: She has not noted gross hematuria, foam in urine,or decreased urine output.  -She has noticed a rash on her belly, which she first observed last week. The rash is pruritic, not tender. She  mentions right breast soreness , which has been present intermittently since after breast surgery, 2021.   Review of Systems  Constitutional:  Negative for chills and fever.  HENT:  Negative for mouth sores and sore throat.   Respiratory:  Negative for shortness of breath and wheezing.   Cardiovascular:  Negative for chest pain and palpitations.  Gastrointestinal:  Positive for diarrhea. Negative for abdominal pain, bloating, flatus and vomiting.  Genitourinary:  Negative for dysuria.  Musculoskeletal:  Positive for arthralgias and back pain. Negative for myalgias.  Skin:  Negative for rash.  Neurological:  Negative for syncope and headaches.  Psychiatric/Behavioral:  Negative for confusion and hallucinations.   See other pertinent positives and negatives in HPI.  Current Outpatient Medications on File Prior to Visit  Medication Sig Dispense Refill   acetaminophen (TYLENOL) 500 MG tablet Take 500-1,000 mg by mouth every 6 (six) hours as needed for headache.      albuterol (VENTOLIN HFA) 108 (90 Base) MCG/ACT inhaler Inhale 2 puffs into the lungs every 4 (four) hours as needed for wheezing. 1 each 3   allopurinol (ZYLOPRIM) 100 MG tablet TAKE 1 TABLET BY MOUTH EVERY DAY 90 tablet 2   amLODipine (NORVASC) 5 MG tablet TAKE 1 TABLET (5 MG TOTAL) BY MOUTH DAILY. 90 tablet 3   APPLE CIDER VINEGAR PO Take 2,400 mg by mouth daily.     ASPERCREME LIDOCAINE EX Apply 1 application topically daily as needed (pain).     aspirin EC 81 MG tablet Take 81  mg by mouth daily.     benzonatate (TESSALON) 100 MG capsule Take 1 capsule (100 mg total) by mouth every 8 (eight) hours. 21 capsule 0   cephALEXin (KEFLEX) 500 MG capsule Take 1 capsule (500 mg total) by mouth 4 (four) times daily. 56 capsule 0   Cholecalciferol (VITAMIN D3) 50 MCG (2000 UT) TABS Take 4,000 Units by mouth daily.      clopidogrel (PLAVIX) 75 MG tablet TAKE 1 TABLET BY MOUTH EVERY DAY WITH BREAKFAST 90 tablet 2   Cyanocobalamin (VITAMIN  B-12) 5000 MCG SUBL Place 5,000 mcg under the tongue daily.      diclofenac Sodium (VOLTAREN) 1 % GEL APPLY 2 GRAMS TOPICALLY 4 TIMES A DAY AS NEEDED 100 g 2   doxycycline (VIBRAMYCIN) 100 MG capsule Take 1 capsule (100 mg total) by mouth 2 (two) times daily. 20 capsule 0   ezetimibe (ZETIA) 10 MG tablet TAKE ONE TABLET BY MOUTH DAILY 90 tablet 3   fluticasone (FLONASE) 50 MCG/ACT nasal spray SPRAY 2 SPRAYS INTO EACH NOSTRIL EVERY DAY 48 mL 1   furosemide (LASIX) 40 MG tablet Take 40 mg by mouth daily.     guaiFENesin (MUCINEX) 600 MG 12 hr tablet Take 600 mg by mouth 2 (two) times daily as needed (congestion).     isosorbide mononitrate (IMDUR) 30 MG 24 hr tablet TAKE 1 TABLET BY MOUTH EVERY DAY 90 tablet 3   losartan (COZAAR) 100 MG tablet TAKE 1 TABLET BY MOUTH EVERY DAY 90 tablet 3   metoprolol succinate (TOPROL-XL) 100 MG 24 hr tablet TAKE 1 TABLET BY MOUTH DAILY. TAKE WITH OR IMMEDIATELY FOLLOWING A MEAL. 90 tablet 3   Misc Natural Products (TART CHERRY ADVANCED) CAPS Take 1,000 capsules by mouth daily.      nitroGLYCERIN (NITROSTAT) 0.4 MG SL tablet PLACE 1 TABLET UNDER THE TONGUE EVERY 5 MINUTES AS NEEDED FOR CHEST PAIN 25 tablet 6   ondansetron (ZOFRAN-ODT) 4 MG disintegrating tablet 41m ODT q4 hours prn nausea/vomit 20 tablet 0   oxymetazoline (AFRIN) 0.05 % nasal spray Place 1 spray into both nostrils 2 (two) times daily as needed for congestion.     pantoprazole (PROTONIX) 40 MG tablet TAKE 1 TABLET BY MOUTH EVERY DAY 90 tablet 2   potassium chloride SA (KLOR-CON M) 20 MEQ tablet Take 20 mEq by mouth 2 (two) times daily.     pravastatin (PRAVACHOL) 40 MG tablet TAKE 1 TABLET BY MOUTH EVERY DAY IN THE EVENING 90 tablet 3   Simethicone (GAS-X PO) Take 2 tablets by mouth daily as needed (gas).     triamcinolone cream (KENALOG) 0.1 % APPLY TO AFFECTED AREA TWICE A DAY 30 g 1   Current Facility-Administered Medications on File Prior to Visit  Medication Dose Route Frequency Provider Last  Rate Last Admin   0.9 %  sodium chloride infusion  500 mL Intravenous Once PIrene Shipper MD       Past Medical History:  Diagnosis Date   Allergic rhinitis    Allergy    Anxiety    Arthritis    [place]ending [place]   Echo 09/12/2022: EF 50-55, GR 1 DD, mildly reduced RVSF, normal PASP (RVSP 17.7), mild dilation of ascending aorta (38 mm), RAP 3   [place]   rigtht breast   Bronchitis    CAD (coronary artery disease)    1/19 PCI/DES to pSalinefor ISR, normal EF.    [place]    hx  of precancerous cells in right breast    Cataract    Cervical dysplasia    unsure of procedure, possible "burning" in her late 25s   CHF (congestive heart failure) ([employer])    Echo 06/2019: EF 55-60, elevated LVEDP, normal RV SF, mild MAC, mild MR, trivial TR   Complication of anesthesia    COPD (chronic obstructive pulmonary disease) (HCC)    early    Depression    Dyspnea    with exertion    Family history of adverse reaction to anesthesia    daughter- problems wiht n/v   GERD (gastroesophageal reflux disease)    Gout    Headache(784.0)    Heart murmur    hx of years ago    Hyperlipidemia    Hypertension    Low back pain    Menopausal syndrome    Myocardial infarct ([place]) 2007   hx of   Overactive bladder    PONV (postoperative nausea and vomiting)    Sleep apnea    Allergies  Allergen Reactions   Erythromycin Rash    But isn't certain   Sulfamethoxazole Rash   Social History   Socioeconomic History   Marital status: Married    Spouse name: Not on file   Number of children: 2   Years of education: Not on file   Highest education level: GED or equivalent  Occupational History   Occupation: retired   Occupation: retired    Comment: Scientist, physiological  Tobacco Use   Smoking status: Former    Packs/day: 1.25    Years: 52.00    Total pack years: 65.00    Types: Cigarettes    Quit date: 2018    Years since quitting: 6.0   Smokeless  tobacco: Never   Tobacco comments:    completely quit May of 2018; period of years she did not smoke   Vaping Use   Vaping Use: Never used  Substance and Sexual Activity   Alcohol use: Yes    Comment: seldom   Drug use: No   Sexual activity: Not Currently  Other Topics Concern   Not on file  Social History Narrative   Not on file   Social Determinants of Health   Financial Resource Strain: Low Risk  (10/25/2022)   Overall Financial Resource Strain (CARDIA)    Difficulty of Paying Living Expenses: Not very hard  Food Insecurity: Food Insecurity Present (10/25/2022)   Hunger Vital Sign    Worried About Running Out of Food in the Last Year: Sometimes true    Ran Out of Food in the Last Year: Sometimes true  Transportation Needs: No Transportation Needs (10/25/2022)   PRAPARE - Hydrologist (Medical): No    Lack of Transportation (Non-Medical): No  Physical Activity: Unknown (10/25/2022)   Exercise Vital Sign    Days of Exercise per Week: 0 days    Minutes of Exercise per Session: Not on file  Recent Concern: Physical Activity - Inactive (10/25/2022)   Exercise Vital Sign    Days of Exercise per Week: 0 days    Minutes of Exercise per Session: 20 min  Stress: Stress Concern Present (10/25/2022)   [place]    Feeling of Stress : Rather much  Social Connections: Unknown (10/25/2022)   Social Connection and Isolation Panel [NHANES]    Frequency of Communication with Friends and Family: Three times a week    Frequency of Social Gatherings  with Friends and Family: Patient refused    Attends Religious Services: Patient refused    Active Member of Clubs or Organizations: No    Attends Music therapist: Not on file    Marital Status: Married   Vitals:   10/26/22 1157  BP: 128/70  Pulse: 100  Resp: 16  Temp: 98.1 F (36.7 C)  SpO2: 97%   Body mass index is 36.84  kg/m.  Physical Exam Vitals and nursing note reviewed.  Constitutional:      General: She is not in acute distress.    Appearance: She is well-developed.  HENT:     Head: Normocephalic and atraumatic.     Mouth/Throat:     Mouth: Mucous membranes are moist.     Pharynx: Oropharynx is clear.  Eyes:     Conjunctiva/sclera: Conjunctivae normal.  Cardiovascular:     Rate and Rhythm: Normal rate and regular rhythm.     Heart sounds: No murmur heard.    Comments: 1+ pitting LE edema, bilateral DP pulses palpable. Pulmonary:     Effort: Pulmonary effort is normal. No respiratory distress.     Breath sounds: Normal breath sounds.  Abdominal:     Palpations: Abdomen is soft. There is no hepatomegaly or mass.     Tenderness: There is no abdominal tenderness.  Lymphadenopathy:     Cervical: No cervical adenopathy.  Skin:    General: Skin is warm.     Findings: Erythema (Left pretibial area, mild. There is no induation on ulcers.) and rash present. Rash is pustular and vesicular.       Neurological:     General: No focal deficit present.     Mental Status: She is alert and oriented to person, place, and time.     Cranial Nerves: No cranial nerve deficit.     Comments: Unstable, antalgic gait assisted with a cane.  Psychiatric:        Mood and Affect: Affect normal. Mood is anxious.   ASSESSMENT AND PLAN:  Ms. Carolle was seen today for diarrhea and herpes zoster.  Diagnoses and all orders for this visit: Orders Placed This Encounter  Procedures   CBC   Basic metabolic panel   Lab Results  Component Value Date   CREATININE 1.33 (H) 10/26/2022   BUN 27 (H) 10/26/2022   NA 140 10/26/2022   K 4.3 10/26/2022   CL 108 10/26/2022   CO2 24 10/26/2022   Lab Results  Component Value Date   WBC 11.2 (H) 10/26/2022   HGB 12.1 10/26/2022   HCT 37.8 10/26/2022   MCV 87.8 10/26/2022   PLT 329.0 10/26/2022   Diarrhea, unspecified type We discussed possible  etiologies. Problem apparently started when Dx'ed with COVID 19 infection and aggravated by abx. Symptomatic treatment recommended for now, OTC Imodium could be used but I do not recommend unless 6 or more stools daily. Oral hydration, Pedialyte or apple juice mixed with water 1:1 are good options. Bland and light diet if tolerated. A daily probiotic may also help. Clearly instructed about warning signs. Colonoscopy 09/2021.  If persistent stool studies will be considered. Monitor for new symptoms. F/U in 4 weeks.  Herpes zoster without complication Affecting right breast. Problem has been going on for a week, so antiviral therapy is not indicated at this time. Differential Dx discussed, she is already on abx treatment.  Cellulitis of left lower leg Problem has improved. Hx of venous stasis dermatitis. Continue appropriate skin care and LE  elevation. Decrease frequency of Cephalexin from qid to tid and complete 7 days. Instructed about warning signs.  CKD (chronic kidney disease), stage III (HCC) Cr 1.5 and e GFR 36 in 09/2022. Dehydration for diarrhea could aggravate problem, so increase fluid intake. Continue low-salt diet and  avoidance of NSAIDs. For now continue losartan 100 mg daily.  Return in about 4 weeks (around 11/23/2022) for chronic problems.  Shizuo Biskup G. [name], MD [facility]. [place] office.

## 2022-10-26 ENCOUNTER — Ambulatory Visit (INDEPENDENT_AMBULATORY_CARE_PROVIDER_SITE_OTHER): Payer: Medicare Other | Admitting: Family Medicine

## 2022-10-26 ENCOUNTER — Encounter: Payer: Self-pay | Admitting: Family Medicine

## 2022-10-26 VITALS — BP 128/70 | HR 100 | Temp 98.1°F | Resp 16 | Ht 65.0 in | Wt 221.4 lb

## 2022-10-26 DIAGNOSIS — N1831 Chronic kidney disease, stage 3a: Secondary | ICD-10-CM | POA: Diagnosis not present

## 2022-10-26 DIAGNOSIS — R197 Diarrhea, unspecified: Secondary | ICD-10-CM | POA: Diagnosis not present

## 2022-10-26 DIAGNOSIS — B029 Zoster without complications: Secondary | ICD-10-CM

## 2022-10-26 DIAGNOSIS — L03116 Cellulitis of left lower limb: Secondary | ICD-10-CM | POA: Diagnosis not present

## 2022-10-26 LAB — CBC
HCT: 37.8 % (ref 36.0–46.0)
Hemoglobin: 12.1 g/dL (ref 12.0–15.0)
MCHC: 32.1 g/dL (ref 30.0–36.0)
MCV: 87.8 fl (ref 78.0–100.0)
Platelets: 329 10*3/uL (ref 150.0–400.0)
RBC: 4.3 Mil/uL (ref 3.87–5.11)
RDW: 16.7 % — ABNORMAL HIGH (ref 11.5–15.5)
WBC: 11.2 10*3/uL — ABNORMAL HIGH (ref 4.0–10.5)

## 2022-10-26 LAB — BASIC METABOLIC PANEL
BUN: 27 mg/dL — ABNORMAL HIGH (ref 6–23)
CO2: 24 mEq/L (ref 19–32)
Calcium: 9.2 mg/dL (ref 8.4–10.5)
Chloride: 108 mEq/L (ref 96–112)
Creatinine, Ser: 1.33 mg/dL — ABNORMAL HIGH (ref 0.40–1.20)
GFR: 39.4 mL/min — ABNORMAL LOW (ref >60.00)
Glucose, Bld: 117 mg/dL — ABNORMAL HIGH (ref 70–99)
Potassium: 4.3 mEq/L (ref 3.5–5.1)
Sodium: 140 mEq/L (ref 135–145)

## 2022-10-26 NOTE — Patient Instructions (Addendum)
A few things to remember from today's visit:  Diarrhea, unspecified type - Plan: CBC, Basic metabolic panel  Herpes zoster without complication  Stage 3a chronic kidney disease (HCC)  Cellulitis of left lower leg  Decrease antibiotic frequency from 4 times daily to 3 times daily. Start a Probiotic, Align 1 cap daily. Adequate hydration. Monitoring for new symptoms.  If you need refills for medications you take chronically, please call your pharmacy. Do not use My Chart to request refills or for acute issues that need immediate attention. If you send a my chart message, it may take a few days to be addressed, specially if I am not in the office.  Please be sure medication list is accurate. If a new problem present, please set up appointment sooner than planned today.

## 2022-10-29 NOTE — Assessment & Plan Note (Signed)
Cr 1.5 and e GFR 36 in 09/2022. Dehydration for diarrhea could aggravate problem, so increase fluid intake. Continue low-salt diet and  avoidance of NSAIDs. For now continue losartan 100 mg daily.

## 2022-11-25 NOTE — Progress Notes (Unsigned)
HPI: Tracy [name] is a 77 y.o. female, who is here today for chronic disease management.  Last seen on 10/26/22, when she was complaining of diarrhea, at that time she was taking cephalexin and doxycycline. She then developed constipation and after taking OTC laxatives diarrhea again.  She states that for the past week her bowel habits have been at her baseline.  She is not taking OTC laxatives, she increased fiber intake (fruits). States that when she was constipated she was having LLQ abdominal pain, which resolved after defecation. Negative for fever, chills, nausea, vomiting, or urinary symptoms.  She thinks she developed a "yeast" infection after taking antibiotics, she is having vaginal pruritus and whitish vaginal discharge.  Denies vaginal bleeding. She has not tried OTC treatment.  Hypertension:  Currently she is on amlodipine 5 mg daily, losartan 100 mg daily, metoprolol succinate 100 mg daily, and Imdur 30 mg daily.  HFrEF, last echo 08/19/22: LVEF 50 to [id number], grade 1 diastolic dysfunction.  She mentions experiencing some difficulty breathing when she does not take her Furosemide 40 mg daily.  Negative for orthopnea and PND.  CKD 3: Negative for gross hematuria, foam in urine, or decreased urine output. Negative for unusual or severe headache, visual changes, exertional chest pain, dyspnea,  focal weakness, or edema.  Lab Results  Component Value Date   CREATININE 1.33 (H) 10/26/2022   BUN 27 (H) 10/26/2022   NA 140 10/26/2022   K 4.3 10/26/2022   CL 108 10/26/2022   CO2 24 10/26/2022   Hyperlipidemia: According to patient, she had discussed with her cardiology the possibility of starting anew injectable medication. She is currently on pravastatin 40 mg daily and Zetia 10 mg daily.  Lab Results  Component Value Date   CHOL 193 08/31/2022   HDL 42 08/31/2022   LDLCALC 113 (H) 08/31/2022   LDLDIRECT 111.0 01/05/2015   TRIG 218 (H) 08/31/2022   CHOLHDL 4.6  (H) 08/31/2022   She has not had any falls since the last visit and is using a cane for mobility.  She has not had episodes of dizziness in the past couple months.  COPD: She reports occasional coughing but is taking medication for it.  No associated wheezing or DOE. She is on albuterol inhaler 2 puffs every 4-6 hours as needed. She follows with pulmonologist, Dr. Lamonte Sakai.  Continue albuterol  Lower extremity edema has been stable. LLE venous stasis dermatitis has improved. She is not wearing compression stockings but planning on doing so. She uses topical triamcinolone cream daily as needed for skin pruritus.  Review of Systems  Constitutional:  Negative for activity change and appetite change.  HENT:  Negative for mouth sores and sore throat.   Genitourinary:  Negative for dysuria.  Musculoskeletal:  Positive for arthralgias, back pain and gait problem.  Neurological:  Negative for syncope and facial asymmetry.  See other pertinent positives and negatives in HPI.  Current Outpatient Medications on File Prior to Visit  Medication Sig Dispense Refill   acetaminophen (TYLENOL) 500 MG tablet Take 500-1,000 mg by mouth every 6 (six) hours as needed for headache.      albuterol (VENTOLIN HFA) 108 (90 Base) MCG/ACT inhaler Inhale 2 puffs into the lungs every 4 (four) hours as needed for wheezing. 1 each 3   allopurinol (ZYLOPRIM) 100 MG tablet TAKE 1 TABLET BY MOUTH EVERY DAY 90 tablet 2   amLODipine (NORVASC) 5 MG tablet TAKE 1 TABLET (5 MG TOTAL) BY MOUTH DAILY. 90 tablet  3   APPLE CIDER VINEGAR PO Take 2,400 mg by mouth daily.     ASPERCREME LIDOCAINE EX Apply 1 application topically daily as needed (pain).     aspirin EC 81 MG tablet Take 81 mg by mouth daily.     Cholecalciferol (VITAMIN D3) 50 MCG (2000 UT) TABS Take 4,000 Units by mouth daily.      clopidogrel (PLAVIX) 75 MG tablet TAKE 1 TABLET BY MOUTH EVERY DAY WITH BREAKFAST 90 tablet 2   Cyanocobalamin (VITAMIN B-12) 5000 MCG  SUBL Place 5,000 mcg under the tongue daily.      diclofenac Sodium (VOLTAREN) 1 % GEL APPLY 2 GRAMS TOPICALLY 4 TIMES A DAY AS NEEDED 100 g 2   ezetimibe (ZETIA) 10 MG tablet TAKE ONE TABLET BY MOUTH DAILY 90 tablet 3   fluticasone (FLONASE) 50 MCG/ACT nasal spray SPRAY 2 SPRAYS INTO EACH NOSTRIL EVERY DAY 48 mL 1   furosemide (LASIX) 40 MG tablet Take 40 mg by mouth daily.     guaiFENesin (MUCINEX) 600 MG 12 hr tablet Take 600 mg by mouth 2 (two) times daily as needed (congestion).     isosorbide mononitrate (IMDUR) 30 MG 24 hr tablet TAKE 1 TABLET BY MOUTH EVERY DAY 90 tablet 3   losartan (COZAAR) 100 MG tablet TAKE 1 TABLET BY MOUTH EVERY DAY 90 tablet 3   metoprolol succinate (TOPROL-XL) 100 MG 24 hr tablet TAKE 1 TABLET BY MOUTH DAILY. TAKE WITH OR IMMEDIATELY FOLLOWING A MEAL. 90 tablet 3   Misc Natural Products (TART CHERRY ADVANCED) CAPS Take 1,000 capsules by mouth daily.      nitroGLYCERIN (NITROSTAT) 0.4 MG SL tablet PLACE 1 TABLET UNDER THE TONGUE EVERY 5 MINUTES AS NEEDED FOR CHEST PAIN 25 tablet 6   ondansetron (ZOFRAN-ODT) 4 MG disintegrating tablet 78m ODT q4 hours prn nausea/vomit 20 tablet 0   oxymetazoline (AFRIN) 0.05 % nasal spray Place 1 spray into both nostrils 2 (two) times daily as needed for congestion.     pantoprazole (PROTONIX) 40 MG tablet TAKE 1 TABLET BY MOUTH EVERY DAY 90 tablet 2   potassium chloride SA (KLOR-CON M) 20 MEQ tablet Take 20 mEq by mouth 2 (two) times daily.     pravastatin (PRAVACHOL) 40 MG tablet TAKE 1 TABLET BY MOUTH EVERY DAY IN THE EVENING 90 tablet 3   Simethicone (GAS-X PO) Take 2 tablets by mouth daily as needed (gas).     triamcinolone cream (KENALOG) 0.1 % APPLY TO AFFECTED AREA TWICE A DAY 30 g 1   Current Facility-Administered Medications on File Prior to Visit  Medication Dose Route Frequency Provider Last Rate Last Admin   0.9 %  sodium chloride infusion  500 mL Intravenous Once PIrene Shipper MD        Past Medical History:   Diagnosis Date   Allergic rhinitis    Allergy    Anxiety    Arthritis    Ascending aorta dilation ([facility] 09/13/2022   Echo 09/12/2022: EF 50-55, GR 1 DD, mildly reduced RVSF, normal PASP (RVSP 17.7), mild dilation of ascending aorta (38 mm), RAP 3   Breast cancer ([facility] 04/28/2020   rigtht breast   Bronchitis    CAD (coronary artery disease)    1/19 PCI/DES to p[place]for ISR, normal EF.    Cancer (HCC)    hx of precancerous cells in right breast    Cataract    Cervical dysplasia    unsure of procedure, possible "burning" in her late  64s   CHF (congestive heart failure) ([place])    Echo 06/2019: EF 55-60, elevated LVEDP, normal RV SF, mild MAC, mild MR, trivial TR   Complication of anesthesia    COPD (chronic obstructive pulmonary disease) (HCC)    early    Depression    Dyspnea    with exertion    Family history of adverse reaction to anesthesia    daughter- problems wiht n/v   GERD (gastroesophageal reflux disease)    Gout    Headache(784.0)    Heart murmur    hx of years ago    Hyperlipidemia    Hypertension    Low back pain    Menopausal syndrome    Myocardial infarct ([place]) 2007   hx of   Overactive bladder    PONV (postoperative nausea and vomiting)    Sleep apnea    Allergies  Allergen Reactions   Erythromycin Rash    But isn't certain   Sulfamethoxazole Rash    Social History   Socioeconomic History   Marital status: Married    Spouse name: Not on file   Number of children: 2   Years of education: Not on file   Highest education level: GED or equivalent  Occupational History   Occupation: retired   Occupation: retired    Comment: Scientist, physiological  Tobacco Use   Smoking status: Former    Packs/day: 1.25    Years: 52.00    Total pack years: 65.00    Types: Cigarettes    Quit date: 2018    Years since quitting: 6.1   Smokeless tobacco: Never   Tobacco comments:    completely quit May of 2018; period of years she did not smoke   Vaping Use   Vaping  Use: Never used  Substance and Sexual Activity   Alcohol use: Yes    Comment: seldom   Drug use: No   Sexual activity: Not Currently  Other Topics Concern   Not on file  Social History Narrative   Not on file   Social Determinants of Health   Financial Resource Strain: Low Risk  (10/25/2022)   Overall Financial Resource Strain (CARDIA)    Difficulty of Paying Living Expenses: Not very hard  Food Insecurity: Food Insecurity Present (10/25/2022)   Hunger Vital Sign    Worried About Running Out of Food in the Last Year: Sometimes true    Ran Out of Food in the Last Year: Sometimes true  Transportation Needs: No Transportation Needs (10/25/2022)   PRAPARE - Hydrologist (Medical): No    Lack of Transportation (Non-Medical): No  Physical Activity: Unknown (10/25/2022)   Exercise Vital Sign    Days of Exercise per Week: 0 days    Minutes of Exercise per Session: Not on file  Recent Concern: Physical Activity - Inactive (10/25/2022)   Exercise Vital Sign    Days of Exercise per Week: 0 days    Minutes of Exercise per Session: 20 min  Stress: Stress Concern Present (10/25/2022)   [place]    Feeling of Stress : Rather much  Social Connections: Unknown (10/25/2022)   Social Connection and Isolation Panel [NHANES]    Frequency of Communication with Friends and Family: Three times a week    Frequency of Social Gatherings with Friends and Family: Patient refused    Attends Religious Services: Patient refused    Active Member of Clubs or Organizations: No  Attends Archivist Meetings: Not on file    Marital Status: Married   Vitals:   11/28/22 1119  BP: 128/74  Pulse: 100  Resp: 16  Temp: 97.9 F (36.6 C)  SpO2: 95%   Body mass index is 37.13 kg/m.  Physical Exam Vitals and nursing note reviewed.  Constitutional:      General: She is not in acute distress.    Appearance: She is  well-developed.  HENT:     Head: Normocephalic and atraumatic.     Mouth/Throat:     Mouth: Mucous membranes are moist.     Pharynx: Oropharynx is clear.  Eyes:     Conjunctiva/sclera: Conjunctivae normal.  Cardiovascular:     Rate and Rhythm: Normal rate and regular rhythm.     Pulses:          Posterior tibial pulses are 2+ on the right side and 2+ on the left side.     Heart sounds: No murmur heard. Pulmonary:     Effort: Pulmonary effort is normal. No respiratory distress.     Breath sounds: Normal breath sounds.  Abdominal:     Palpations: Abdomen is soft. There is no hepatomegaly or mass.     Tenderness: There is no abdominal tenderness.  Musculoskeletal:     Right lower leg: 1+ Pitting Edema present.     Left lower leg: 1+ Pitting Edema present.  Lymphadenopathy:     Cervical: No cervical adenopathy.  Skin:    General: Skin is warm.     Findings: Erythema present. No rash.       Neurological:     General: No focal deficit present.     Mental Status: She is alert and oriented to person, place, and time.     Cranial Nerves: No cranial nerve deficit.     Comments: Unstable gait assisted with a cane.  Psychiatric:        Mood and Affect: Mood and affect normal.   ASSESSMENT AND PLAN:  Ms. Derya was seen today for medical management of chronic issues.  Diagnoses and all orders for this visit:  Essential hypertension BP adequately controlled. Continue Metoprolol succinate 100 mg daily,Imdur 30 mg daily,and Losartan 100 mg daily. Monitor BP at home. Low salt diet to continue. Has appt with cardiologist in a could days.  Chronic heart failure with preserved ejection fraction (HCC) Problem seems to be stable. Currently on furosemide 40 mg daily, losartan 100 mg daily, metoprolol succinate 100 mg daily. She has an appointment with cardiologist in a couple days. Continue low-salt diet.  Atherosclerosis of native coronary artery of native heart with angina  pectoris (HCC) On Imdur, metoprolol, amlodipine, pravastatin, Zetia, and Plavix. Follows with cardiologist, next appointment on 11/30/2022.  COPD (chronic obstructive pulmonary disease) (HCC) Stable. Inhaler 2 puffs every 4-6 hours as needed. Following with pulmonologist, Dr. Lamonte Sakai, last seen on 10/05/22.  CKD (chronic kidney disease), stage III (HCC) Last Cr and e GFR improved, closer to her baseline. Continue low-salt diet and avoidance of NSAIDs. Adequate hydration and BP control. Continue losartan 100 mg daily.  Mixed hyperlipidemia LDL 113 in 08/2022. Continue pravastatin 40 mg daily and Zetia 10 mg daily. Apparently she is planning on starting PCSK9 inhibitor. Following with cardiologist.  Venous stasis dermatitis of left lower extremity Affecting mainly LLE. It has improved some. Recommend trying compression stockings. She is on furosemide 40 mg daily. Continue topical triamcinolone cream daily as needed for skin pruritus. Good skin care to continue.  Constipation Problem has improved after increasing fiber intake. Last colonoscopy in 09/2021.  Aortic atherosclerosis ([place]) Seen on abdominal/pelvis CT in 11/2020. Currently on pravastatin 40 mg daily and Aspirin 81 mg daily.  Vaginal discharge Hx suggest fungal vulvovaginitis, so pelvic exam deferred for next visit if problem does not resolved. Recommend topical treatment with Terazol for 7 days. Monitor for new symptoms. Follow-up as needed.  -     terconazole (TERAZOL 7) 0.4 % vaginal cream; Place 1 applicator vaginally at bedtime.  I spent a total of 40 minutes in both face to face and non face to face activities for this visit on the date of this encounter. During this time history was obtained and documented, examination was performed, prior labs reviewed, and assessment/plan discussed.We discussed recommendations for lung cancer screening. She had a chest CTA on 06/13/22, will plan on lung cancer screening in  05/2023.  Return in about 6 months (around 05/29/2023) for chronic problems.  Tracy Weisse G. [name], MD  [facility]. [place] office.

## 2022-11-28 ENCOUNTER — Encounter: Payer: Self-pay | Admitting: Family Medicine

## 2022-11-28 ENCOUNTER — Ambulatory Visit (INDEPENDENT_AMBULATORY_CARE_PROVIDER_SITE_OTHER): Payer: Medicare Other | Admitting: Family Medicine

## 2022-11-28 VITALS — BP 128/74 | HR 100 | Temp 97.9°F | Resp 16 | Ht 65.0 in | Wt 223.1 lb

## 2022-11-28 DIAGNOSIS — N898 Other specified noninflammatory disorders of vagina: Secondary | ICD-10-CM

## 2022-11-28 DIAGNOSIS — N1831 Chronic kidney disease, stage 3a: Secondary | ICD-10-CM

## 2022-11-28 DIAGNOSIS — I5032 Chronic diastolic (congestive) heart failure: Secondary | ICD-10-CM | POA: Diagnosis not present

## 2022-11-28 DIAGNOSIS — I25119 Atherosclerotic heart disease of native coronary artery with unspecified angina pectoris: Secondary | ICD-10-CM | POA: Diagnosis not present

## 2022-11-28 DIAGNOSIS — J449 Chronic obstructive pulmonary disease, unspecified: Secondary | ICD-10-CM | POA: Diagnosis not present

## 2022-11-28 DIAGNOSIS — K5900 Constipation, unspecified: Secondary | ICD-10-CM

## 2022-11-28 DIAGNOSIS — I10 Essential (primary) hypertension: Secondary | ICD-10-CM | POA: Diagnosis not present

## 2022-11-28 DIAGNOSIS — E782 Mixed hyperlipidemia: Secondary | ICD-10-CM | POA: Diagnosis not present

## 2022-11-28 DIAGNOSIS — I700 Atherosclerosis of aorta: Secondary | ICD-10-CM | POA: Diagnosis not present

## 2022-11-28 DIAGNOSIS — I872 Venous insufficiency (chronic) (peripheral): Secondary | ICD-10-CM | POA: Diagnosis not present

## 2022-11-28 MED ORDER — TERCONAZOLE 0.4 % VA CREA: 1.0000 | TOPICAL_CREAM | Freq: Every day | VAGINAL | 0 refills | Status: DC

## 2022-11-28 NOTE — Assessment & Plan Note (Signed)
Problem seems to be stable. Currently on furosemide 40 mg daily, losartan 100 mg daily, metoprolol succinate 100 mg daily. She has an appointment with cardiologist in a couple days. Continue low-salt diet.

## 2022-11-28 NOTE — Assessment & Plan Note (Signed)
Problem has improved after increasing fiber intake. Last colonoscopy in 09/2021.

## 2022-11-28 NOTE — Assessment & Plan Note (Signed)
Seen on abdominal/pelvis CT in 11/2020. Currently on pravastatin 40 mg daily and Aspirin 81 mg daily.

## 2022-11-28 NOTE — Assessment & Plan Note (Signed)
Affecting mainly LLE. It has improved some. Recommend trying compression stockings. She is on furosemide 40 mg daily. Continue topical triamcinolone cream daily as needed for skin pruritus. Good skin care to continue.

## 2022-11-28 NOTE — Patient Instructions (Addendum)
A few things to remember from today's visit:  Essential hypertension  Stage 3a chronic kidney disease ([place])  Today we did not do labs, you may have some when you see your cardiologist. No changes today. Continue adequate fiber intake. Fall precautions.  I see you back in 6 months.  If you need refills for medications you take chronically, please call your pharmacy. Do not use My Chart to request refills or for acute issues that need immediate attention. If you send a my chart message, it may take a few days to be addressed, specially if I am not in the office.  Please be sure medication list is accurate. If a new problem present, please set up appointment sooner than planned today.

## 2022-11-28 NOTE — Assessment & Plan Note (Signed)
Stable. Inhaler 2 puffs every 4-6 hours as needed. Following with pulmonologist, Dr. Lamonte Sakai, last seen on 10/05/22.

## 2022-11-28 NOTE — Assessment & Plan Note (Signed)
On Imdur, metoprolol, amlodipine, pravastatin, Zetia, and Plavix. Follows with cardiologist, next appointment on 11/30/2022.

## 2022-11-28 NOTE — Assessment & Plan Note (Signed)
Last Cr and e GFR improved, closer to her baseline. Continue low-salt diet and avoidance of NSAIDs. Adequate hydration and BP control. Continue losartan 100 mg daily.

## 2022-11-28 NOTE — Assessment & Plan Note (Signed)
LDL 113 in 08/2022. Continue pravastatin 40 mg daily and Zetia 10 mg daily. Apparently she is planning on starting PCSK9 inhibitor. Following with cardiologist.

## 2022-11-28 NOTE — Assessment & Plan Note (Signed)
BP adequately controlled. Continue Metoprolol succinate 100 mg daily,Imdur 30 mg daily,and Losartan 100 mg daily. Monitor BP at home. Low salt diet to continue. Has appt with cardiologist in a could days.

## 2022-11-30 ENCOUNTER — Encounter: Payer: Self-pay | Admitting: Physician Assistant

## 2022-11-30 ENCOUNTER — Ambulatory Visit: Payer: Medicare Other | Attending: Physician Assistant | Admitting: Physician Assistant

## 2022-11-30 VITALS — BP 122/70 | HR 100 | Ht 65.0 in | Wt 222.4 lb

## 2022-11-30 DIAGNOSIS — I5032 Chronic diastolic (congestive) heart failure: Secondary | ICD-10-CM | POA: Diagnosis not present

## 2022-11-30 DIAGNOSIS — E782 Mixed hyperlipidemia: Secondary | ICD-10-CM | POA: Diagnosis not present

## 2022-11-30 DIAGNOSIS — I25119 Atherosclerotic heart disease of native coronary artery with unspecified angina pectoris: Secondary | ICD-10-CM | POA: Diagnosis not present

## 2022-11-30 DIAGNOSIS — N1831 Chronic kidney disease, stage 3a: Secondary | ICD-10-CM | POA: Insufficient documentation

## 2022-11-30 DIAGNOSIS — I10 Essential (primary) hypertension: Secondary | ICD-10-CM | POA: Insufficient documentation

## 2022-11-30 MED ORDER — SPIRONOLACTONE 25 MG PO TABS: 12.5000 mg | ORAL_TABLET | Freq: Every day | ORAL | 3 refills | Status: DC

## 2022-11-30 MED ORDER — POTASSIUM CHLORIDE CRYS ER 10 MEQ PO TBCR: 10.0000 meq | EXTENDED_RELEASE_TABLET | Freq: Two times a day (BID) | ORAL | 3 refills | Status: DC

## 2022-11-30 NOTE — Patient Instructions (Addendum)
Medication Instructions:  Your physician has recommended you make the following change in your medication:   START Spironolactone 25 mg taking 1/2 tablet daily   REDUCE the Potassium to 10 meq taking 1 twice a day   *If you need a refill on your cardiac medications before your next appointment, please call your pharmacy*   Lab Work: 12/08/22:  [place] A BMET ANYTIME AFTER 7:15 A.M  12/15/22:  COME IN FOR A BMET ANYTIME AFTER 7;15  If you have labs (blood work) drawn today and your tests are completely normal, you will receive your results only by: [place] (if you have MyChart) OR A paper copy in the mail If you have any lab test that is abnormal or we need to change your treatment, we will call you to review the results.   Testing/Procedures: None ordered   Follow-Up: At [facility], you and your health needs are our priority.  As part of our continuing mission to provide you with exceptional heart care, we have created designated Provider Care Teams.  These Care Teams include your primary Cardiologist (physician) and Advanced Practice Providers (APPs -  Physician Assistants and Nurse Practitioners) who all work together to provide you with the care you need, when you need it.  We recommend signing up for the patient portal called "MyChart".  Sign up information is provided on this After Visit Summary.  MyChart is used to connect with patients for Virtual Visits (Telemedicine).  Patients are able to view lab/test results, encounter notes, upcoming appointments, etc.  Non-urgent messages can be sent to your provider as well.   To learn more about what you can do with MyChart, go to [email or web address].    Your next appointment:   6 month(s)  Provider:   Sherren Mocha, MD     Other Instructions

## 2022-11-30 NOTE — Assessment & Plan Note (Signed)
Recent creatinine stable.  Obtain follow-up BMET weekly x 2 with the addition of spironolactone.

## 2022-11-30 NOTE — Assessment & Plan Note (Signed)
She has been evaluated by Dr. Debara Pickett for alternative medications.  She she declined adding any further medications.  LDL remains above goal at 113.  Continue Zetia 10 mg daily, pravastatin 40 mg daily.

## 2022-11-30 NOTE — Assessment & Plan Note (Signed)
NYHA IIb.  She continues to struggle with volume excess.  We discussed advancing GDMT for HFpEF.  We reviewed the rationale for MRAs, SGLT2 inhibitors and ARNI's.  Her blood pressure is controlled.  Therefore, I do not think she needs to start [facility].  I think she would be a great candidate for SGLT2 inhibitor.  However, she is concerned about the cost.  She has not been able to get approval for other medications in the past.  Therefore, I will start her on spironolactone 12.5 mg daily.  Decrease potassium to 10 mEq twice daily.  Continue furosemide 40 mg daily.  Obtain BMET weekly x 2.  Follow-up 6 months.

## 2022-11-30 NOTE — Progress Notes (Signed)
Cardiology Office Note:    Date:  11/30/2022   ID:  [name], DOB [birth year], MRN [id number]  PCP:  [name], [name], [place] [facility] Cardiologist:  Sherren Mocha, MD Cardiology APP:  [place] [place]    Referring MD: [name], [name], MD   Patient Profile: Coronary artery disease  S/p BMS to LCx in 2007; S/p DES to LCx in 10/2017 Myoview 07/2019: ant ischemia; high risk >> Cath 07/2019: patent LCx, stable anatomy - Med Rx Cath 09/29/20: oLAD 30, mLAD 60/70, oD1 70, 1st Sept Perf 100 (R-L collats); oLCx 25, pLCx stent ok w 8 ISR, pRCA 25, EF 50-55 >> Med Rx  Heart failure with preserved ejection fraction Echo 07/17/19: EF 55-60, GR 1 DD, NL RVSF, mild MR, trivial TR, NL PASP, RAP 3 TTE 09/12/2022: EF 50-55, GR 1 DD, mildly reduced RVSF, normal PASP, ascending aorta 38 mm, RAP 3 Chronic kidney disease 3 Chronic Obstructive Pulmonary Disease  Hypertension  Hyperlipidemia  Intol of statins (myalgias) // Elevated LFTs >> Statin DCd (Lipitor) Eval w [facility] (Dr. Debara Pickett) >> +Pravastatin w improved LDL Carotid artery stenosis             [place] 04/21/21: bilat ICA 1-39  Hepatic steatosis  Aortic atherosclerosis (CT 11/2020) S/p BSO, mini-lap for drainage of contained cyst (10/2020) Hx of CVA (remote lacunar infarct noted on MRI)    History of Present Illness:   Tracy [name] is a 77 y.o. female with the above problem list.  She was last seen 09/05/22. She returns for f/u on CAD, CHF.  She is here alone.  Since last seen, she has not had chest discomfort.  Her shortness of breath is overall stable.  She sleeps on an incline chronically.  She continues to have lower extremity edema.  Left side is worse than the right.  She has not had syncope.  Unfortunately she recently had COVID-19 as well as herpes zoster.  She has completed antibiotics for cellulitis.  This seems to have improved.    Subjective    Reviewed and updated this  encounter:     ROS  Objective   Labs/Other Test Reviewed:   Recent Labs: 08/24/2022: NT-Pro BNP 241 10/11/2022: ALT 25 10/26/2022: BUN 27; Creatinine, Ser 1.33; Hemoglobin 12.1; Platelets 329.0; Potassium 4.3; Sodium 140   Recent Lipid Panel Recent Labs    08/31/22 0931  CHOL 193  TRIG 218*  HDL 42  LDLCALC 113*     Risk Assessment/Calculations/Metrics:             Physical Exam:   VS:  BP 122/70   Pulse 100   Ht 5' 5"$  (1.651 m)   Wt 222 lb 6.4 oz (100.9 kg)   SpO2 98%   BMI 37.01 kg/m    Wt Readings from Last 3 Encounters:  11/30/22 222 lb 6.4 oz (100.9 kg)  11/28/22 223 lb 2 oz (101.2 kg)  10/26/22 221 lb 6 oz (100.4 kg)    Constitutional:      Appearance: Healthy appearance. Not in distress.  Pulmonary:     Breath sounds: Normal breath sounds. No rales.  Cardiovascular:     Normal rate. Regular rhythm. Normal S1. Normal S2.      Murmurs: There is no murmur.  Edema:    Peripheral edema present.    Pretibial: 1+ edema of the left pretibial area and trace edema of the right pretibial area.  Assessment & Plan    ASSESSMENT & PLAN:   Chronic heart failure with preserved ejection fraction (HCC) NYHA IIb.  She continues to struggle with volume excess.  We discussed advancing GDMT for HFpEF.  We reviewed the rationale for MRAs, SGLT2 inhibitors and ARNI's.  Her blood pressure is controlled.  Therefore, I do not think she needs to start [facility].  I think she would be a great candidate for SGLT2 inhibitor.  However, she is concerned about the cost.  She has not been able to get approval for other medications in the past.  Therefore, I will start her on spironolactone 12.5 mg daily.  Decrease potassium to 10 mEq twice daily.  Continue furosemide 40 mg daily.  Obtain BMET weekly x 2.  Follow-up 6 months.  Essential hypertension Blood pressure is controlled.  Continue amlodipine 5 mg daily, Imdur 30 mg daily, losartan 100 mg daily, Toprol-XL 100 mg  daily.  Atherosclerosis of native coronary artery of native heart with angina pectoris ([place]) S/p BMS to LCx in 2007 and DES to LCx in 2019. Cath in 09/2020 demonstrated a patent LCx stent, mod mLAD stenosis and a chronically occluded sept perf w R to L collaterals.  She continues to do well without chest discomfort to suggest angina.  Continue amlodipine 5 mg daily, aspirin 81 mg, Plavix 85 mg, Zetia 10 mg, Imdur 30 mg, Toprol-XL 100 mg, pravastatin 40 mg.  Mixed hyperlipidemia She has been evaluated by Dr. Debara Pickett for alternative medications.  She she declined adding any further medications.  LDL remains above goal at 113.  Continue Zetia 10 mg daily, pravastatin 40 mg daily.  CKD (chronic kidney disease), stage III (HCC) Recent creatinine stable.  Obtain follow-up BMET weekly x 2 with the addition of spironolactone.             Dispo:  No follow-ups on file.   Signed, Richardson Dopp, PA-C  11/30/2022 [address], [place], [place]  [address] Phone: [phone]; Fax: [phone]

## 2022-11-30 NOTE — Assessment & Plan Note (Signed)
S/p BMS to LCx in 2007 and DES to LCx in 2019. Cath in 09/2020 demonstrated a patent LCx stent, mod mLAD stenosis and a chronically occluded sept perf w R to L collaterals.  She continues to do well without chest discomfort to suggest angina.  Continue amlodipine 5 mg daily, aspirin 81 mg, Plavix 85 mg, Zetia 10 mg, Imdur 30 mg, Toprol-XL 100 mg, pravastatin 40 mg.

## 2022-11-30 NOTE — Assessment & Plan Note (Signed)
Blood pressure is controlled.  Continue amlodipine 5 mg daily, Imdur 30 mg daily, losartan 100 mg daily, Toprol-XL 100 mg daily.

## 2022-12-07 ENCOUNTER — Ambulatory Visit: Payer: Medicare Other | Admitting: Physician Assistant

## 2022-12-08 ENCOUNTER — Ambulatory Visit: Payer: Medicare Other | Attending: Physician Assistant

## 2022-12-08 DIAGNOSIS — I5032 Chronic diastolic (congestive) heart failure: Secondary | ICD-10-CM

## 2022-12-08 LAB — BASIC METABOLIC PANEL
BUN/Creatinine Ratio: 26 (ref 12–28)
BUN: 48 mg/dL — ABNORMAL HIGH (ref 8–27)
CO2: 24 mmol/L (ref 20–29)
Calcium: 9.8 mg/dL (ref 8.7–10.3)
Chloride: 104 mmol/L (ref 96–106)
Creatinine, Ser: 1.83 mg/dL — ABNORMAL HIGH (ref 0.57–1.00)
Glucose: 128 mg/dL — ABNORMAL HIGH (ref 70–99)
Potassium: 5.3 mmol/L — ABNORMAL HIGH (ref 3.5–5.2)
Sodium: 141 mmol/L (ref 134–144)
eGFR: 29 mL/min/{1.73_m2} — ABNORMAL LOW (ref >59)

## 2022-12-12 ENCOUNTER — Ambulatory Visit: Payer: Medicare Other | Attending: Physician Assistant

## 2022-12-12 ENCOUNTER — Other Ambulatory Visit: Payer: Self-pay

## 2022-12-12 DIAGNOSIS — I10 Essential (primary) hypertension: Secondary | ICD-10-CM

## 2022-12-12 DIAGNOSIS — N1831 Chronic kidney disease, stage 3a: Secondary | ICD-10-CM

## 2022-12-12 DIAGNOSIS — I5032 Chronic diastolic (congestive) heart failure: Secondary | ICD-10-CM

## 2022-12-13 LAB — BASIC METABOLIC PANEL
BUN/Creatinine Ratio: 24 (ref 12–28)
BUN: 29 mg/dL — ABNORMAL HIGH (ref 8–27)
CO2: 20 mmol/L (ref 20–29)
Calcium: 9.6 mg/dL (ref 8.7–10.3)
Chloride: 106 mmol/L (ref 96–106)
Creatinine, Ser: 1.21 mg/dL — ABNORMAL HIGH (ref 0.57–1.00)
Glucose: 124 mg/dL — ABNORMAL HIGH (ref 70–99)
Potassium: 4.7 mmol/L (ref 3.5–5.2)
Sodium: 143 mmol/L (ref 134–144)
eGFR: 47 mL/min/{1.73_m2} — ABNORMAL LOW (ref >59)

## 2022-12-15 ENCOUNTER — Other Ambulatory Visit: Payer: Medicare Other

## 2022-12-29 ENCOUNTER — Other Ambulatory Visit: Payer: Self-pay | Admitting: Family Medicine

## 2022-12-29 DIAGNOSIS — K219 Gastro-esophageal reflux disease without esophagitis: Secondary | ICD-10-CM

## 2023-01-02 ENCOUNTER — Ambulatory Visit (INDEPENDENT_AMBULATORY_CARE_PROVIDER_SITE_OTHER): Payer: Medicare Other | Admitting: Family Medicine

## 2023-01-02 ENCOUNTER — Encounter: Payer: Self-pay | Admitting: Family Medicine

## 2023-01-02 VITALS — BP 122/70 | HR 81 | Temp 97.7°F | Resp 16 | Ht 65.0 in | Wt 224.2 lb

## 2023-01-02 DIAGNOSIS — N644 Mastodynia: Secondary | ICD-10-CM | POA: Diagnosis not present

## 2023-01-02 DIAGNOSIS — L080 Pyoderma: Secondary | ICD-10-CM

## 2023-01-02 DIAGNOSIS — I872 Venous insufficiency (chronic) (peripheral): Secondary | ICD-10-CM

## 2023-01-02 DIAGNOSIS — L304 Erythema intertrigo: Secondary | ICD-10-CM

## 2023-01-02 MED ORDER — NYSTATIN 100000 UNIT/GM EX CREA: 1.0000 | TOPICAL_CREAM | Freq: Two times a day (BID) | CUTANEOUS | 2 refills | Status: DC

## 2023-01-02 MED ORDER — FLUCONAZOLE 150 MG PO TABS: 150.0000 mg | ORAL_TABLET | Freq: Once | ORAL | 0 refills | Status: AC

## 2023-01-02 MED ORDER — DOXYCYCLINE HYCLATE 100 MG PO TABS: 100.0000 mg | ORAL_TABLET | Freq: Two times a day (BID) | ORAL | 0 refills | Status: AC

## 2023-01-02 NOTE — Patient Instructions (Addendum)
A few things to remember from today's visit:  Breast tenderness in female  Intertrigo - Plan: fluconazole (DIFLUCAN) 150 MG tablet, nystatin cream (MYCOSTATIN)  Pustular rash - Plan: doxycycline (VIBRA-TABS) 100 MG tablet  Venous stasis dermatitis of left lower extremity Keep area clean with soap and water, antibacterial soap. Antibiotic for possible overlap bacterial infection. Good bra support. Mammogram is important to have.  Diflucan once and Nystatin cream on affected area as needed.  If you need refills for medications you take chronically, please call your pharmacy. Do not use My Chart to request refills or for acute issues that need immediate attention. If you send a my chart message, it may take a few days to be addressed, specially if I am not in the office.  Please be sure medication list is accurate. If a new problem present, please set up appointment sooner than planned today.

## 2023-01-02 NOTE — Progress Notes (Unsigned)
ACUTE VISIT Chief Complaint  Patient presents with   Blister    On breasts, started about a week ago. Now on the left breast and painful, sores. Cardiology started spironolactone & pt stopped it thinking that may have been the cause.    HPI: [name] is a 77 y.o. female, who is here today complaining of *** Rash This is a new problem. The affected locations include the chest and left shoulder.   ***denies any new medication, detergent, soap, or body product. No known insect bite or outdoor exposures to plants. No sick contact. No Hx of eczema or similar rash in the past.  OTC medication for this problem: ***  *** denies oral lesions/edema,cough, wheezing, dyspnea, abdominal pain, nausea, or vomiting.  Review of Systems  Skin:  Positive for rash.   See other pertinent positives and negatives in HPI.  Current Outpatient Medications on File Prior to Visit  Medication Sig Dispense Refill   acetaminophen (TYLENOL) 500 MG tablet Take 500-1,000 mg by mouth every 6 (six) hours as needed for headache.      albuterol (VENTOLIN HFA) 108 (90 Base) MCG/ACT inhaler Inhale 2 puffs into the lungs every 4 (four) hours as needed for wheezing. 1 each 3   allopurinol (ZYLOPRIM) 100 MG tablet TAKE 1 TABLET BY MOUTH EVERY DAY 90 tablet 2   amLODipine (NORVASC) 5 MG tablet TAKE 1 TABLET (5 MG TOTAL) BY MOUTH DAILY. 90 tablet 3   APPLE CIDER VINEGAR PO Take 2,400 mg by mouth daily.     ASPERCREME LIDOCAINE EX Apply 1 application topically daily as needed (pain).     aspirin EC 81 MG tablet Take 81 mg by mouth daily.     Cholecalciferol (VITAMIN D3) 50 MCG (2000 UT) TABS Take 4,000 Units by mouth daily.      clopidogrel (PLAVIX) 75 MG tablet TAKE 1 TABLET BY MOUTH EVERY DAY WITH BREAKFAST 90 tablet 2   Cyanocobalamin (VITAMIN B-12) 5000 MCG SUBL Place 5,000 mcg under the tongue daily.      diclofenac Sodium (VOLTAREN) 1 % GEL APPLY 2 GRAMS TOPICALLY 4 TIMES A DAY AS NEEDED 100 g 2    ezetimibe (ZETIA) 10 MG tablet TAKE ONE TABLET BY MOUTH DAILY 90 tablet 3   fluticasone (FLONASE) 50 MCG/ACT nasal spray SPRAY 2 SPRAYS INTO EACH NOSTRIL EVERY DAY 48 mL 1   furosemide (LASIX) 40 MG tablet Take 40 mg by mouth daily.     guaiFENesin (MUCINEX) 600 MG 12 hr tablet Take 600 mg by mouth 2 (two) times daily as needed (congestion).     isosorbide mononitrate (IMDUR) 30 MG 24 hr tablet TAKE 1 TABLET BY MOUTH EVERY DAY 90 tablet 3   losartan (COZAAR) 100 MG tablet TAKE 1 TABLET BY MOUTH EVERY DAY 90 tablet 3   metoprolol succinate (TOPROL-XL) 100 MG 24 hr tablet TAKE 1 TABLET BY MOUTH DAILY. TAKE WITH OR IMMEDIATELY FOLLOWING A MEAL. 90 tablet 3   Misc Natural Products (TART CHERRY ADVANCED) CAPS Take 1,000 capsules by mouth daily.      nitroGLYCERIN (NITROSTAT) 0.4 MG SL tablet PLACE 1 TABLET UNDER THE TONGUE EVERY 5 MINUTES AS NEEDED FOR CHEST PAIN 25 tablet 6   ondansetron (ZOFRAN-ODT) 4 MG disintegrating tablet 4mg  ODT q4 hours prn nausea/vomit 20 tablet 0   oxymetazoline (AFRIN) 0.05 % nasal spray Place 1 spray into both nostrils 2 (two) times daily as needed for congestion.     pantoprazole (PROTONIX) 40 MG tablet TAKE  1 TABLET BY MOUTH EVERY DAY 90 tablet 2   potassium chloride (KLOR-CON M) 10 MEQ tablet Take 1 tablet (10 mEq total) by mouth 2 (two) times daily. 180 tablet 3   pravastatin (PRAVACHOL) 40 MG tablet TAKE 1 TABLET BY MOUTH EVERY DAY IN THE EVENING 90 tablet 3   Simethicone (GAS-X PO) Take 2 tablets by mouth daily as needed (gas).     spironolactone (ALDACTONE) 25 MG tablet Take 0.5 tablets (12.5 mg total) by mouth daily. 45 tablet 3   terconazole (TERAZOL 7) 0.4 % vaginal cream Place 1 applicator vaginally at bedtime. 45 g 0   triamcinolone cream (KENALOG) 0.1 % APPLY TO AFFECTED AREA TWICE A DAY 30 g 1   Current Facility-Administered Medications on File Prior to Visit  Medication Dose Route Frequency Provider Last Rate Last Admin   0.9 %  sodium chloride infusion   500 mL Intravenous Once Irene Shipper, MD        Past Medical History:  Diagnosis Date   Allergic rhinitis    Allergy    Anxiety    Arthritis    Ascending aorta dilation ([place]) 09/13/2022   Echo 09/12/2022: EF 50-55, GR 1 DD, mildly reduced RVSF, normal PASP (RVSP 17.7), mild dilation of ascending aorta (38 mm), RAP 3   Breast cancer ([place]) 04/28/2020   rigtht breast   Bronchitis    CAD (coronary artery disease)    1/19 PCI/DES to [place] for ISR, normal EF.    Cancer (HCC)    hx of precancerous cells in right breast    Cataract    Cervical dysplasia    unsure of procedure, possible "burning" in her late 20s   CHF (congestive heart failure) ([place])    Echo 06/2019: EF 55-60, elevated LVEDP, normal RV SF, mild MAC, mild MR, trivial TR   Complication of anesthesia    COPD (chronic obstructive pulmonary disease) (HCC)    early    Depression    Dyspnea    with exertion    Family history of adverse reaction to anesthesia    daughter- problems wiht n/v   GERD (gastroesophageal reflux disease)    Gout    Headache(784.0)    Heart murmur    hx of years ago    Hyperlipidemia    Hypertension    Low back pain    Menopausal syndrome    Myocardial infarct ([place]) 2007   hx of   Overactive bladder    PONV (postoperative nausea and vomiting)    Sleep apnea    Allergies  Allergen Reactions   Erythromycin Rash    But isn't certain   Sulfamethoxazole Rash    Social History   Socioeconomic History   Marital status: Married    Spouse name: Not on file   Number of children: 2   Years of education: Not on file   Highest education level: GED or equivalent  Occupational History   Occupation: retired   Occupation: retired    Comment: Scientist, physiological  Tobacco Use   Smoking status: Former    Packs/day: 1.25    Years: 52.00    Additional pack years: 0.00    Total pack years: 65.00    Types: Cigarettes    Quit date: 2018    Years since quitting: 6.2   Smokeless tobacco: Never    Tobacco comments:    completely quit May of 2018; period of years she did not smoke   Vaping Use   Vaping Use:  Never used  Substance and Sexual Activity   Alcohol use: Yes    Comment: seldom   Drug use: No   Sexual activity: Not Currently  Other Topics Concern   Not on file  Social History Narrative   Not on file   Social Determinants of Health   Financial Resource Strain: Low Risk  (10/25/2022)   Overall Financial Resource Strain (CARDIA)    Difficulty of Paying Living Expenses: Not very hard  Food Insecurity: Food Insecurity Present (10/25/2022)   Hunger Vital Sign    Worried About Running Out of Food in the Last Year: Sometimes true    Ran Out of Food in the Last Year: Sometimes true  Transportation Needs: No Transportation Needs (10/25/2022)   PRAPARE - Hydrologist (Medical): No    Lack of Transportation (Non-Medical): No  Physical Activity: Unknown (10/25/2022)   Exercise Vital Sign    Days of Exercise per Week: 0 days    Minutes of Exercise per Session: Not on file  Recent Concern: Physical Activity - Inactive (10/25/2022)   Exercise Vital Sign    Days of Exercise per Week: 0 days    Minutes of Exercise per Session: 20 min  Stress: Stress Concern Present (10/25/2022)   [place]    Feeling of Stress : Rather much  Social Connections: Unknown (10/25/2022)   Social Connection and Isolation Panel [NHANES]    Frequency of Communication with Friends and Family: Three times a week    Frequency of Social Gatherings with Friends and Family: Patient declined    Attends Religious Services: Patient declined    Active Member of Clubs or Organizations: No    Attends Archivist Meetings: Not on file    Marital Status: Married    Vitals:   01/02/23 1444  BP: 122/70  Pulse: 81  Temp: 97.7 F (36.5 C)  SpO2: 96%   Body mass index is 37.32 kg/m.  Physical Exam Constitutional:       General: She is not in acute distress.    Appearance: She is well-developed.  HENT:     Head: Atraumatic.  Eyes:     Conjunctiva/sclera: Conjunctivae normal.  Pulmonary:     Effort: Pulmonary effort is normal. No respiratory distress.     Breath sounds: Normal breath sounds.  Chest:  Breasts:    Right: Tenderness present. No inverted nipple, mass or nipple discharge.     Left: Tenderness present. No inverted nipple, mass or nipple discharge.    Lymphadenopathy:     Cervical: No cervical adenopathy.     Upper Body:     Right upper body: No axillary adenopathy.     Left upper body: No axillary (tenderness) adenopathy.  Skin:    General: Skin is warm.     Findings: Erythema and rash present. Rash is pustular.          Comments: Superf excoriation *** Left  Neurological:     Mental Status: She is alert and oriented to person, place, and time.     Comments: Antalgic gait, assisted with a cane.  Psychiatric:        Mood and Affect: Affect normal. Mood is anxious.     ASSESSMENT AND PLAN: There are no diagnoses linked to this encounter.  No follow-ups on file.  Dayona Shaheen G. [name], MD  [facility]. [place] office.  Discharge Instructions   None

## 2023-02-07 ENCOUNTER — Other Ambulatory Visit: Payer: Self-pay | Admitting: Cardiovascular Disease

## 2023-02-07 DIAGNOSIS — E785 Hyperlipidemia, unspecified: Secondary | ICD-10-CM

## 2023-03-28 ENCOUNTER — Encounter: Payer: Self-pay | Admitting: Internal Medicine

## 2023-03-28 ENCOUNTER — Ambulatory Visit (INDEPENDENT_AMBULATORY_CARE_PROVIDER_SITE_OTHER): Payer: Medicare Other | Admitting: Internal Medicine

## 2023-03-28 VITALS — BP 138/82 | HR 89 | Temp 97.8°F

## 2023-03-28 DIAGNOSIS — B3731 Acute candidiasis of vulva and vagina: Secondary | ICD-10-CM

## 2023-03-28 DIAGNOSIS — R300 Dysuria: Secondary | ICD-10-CM

## 2023-03-28 DIAGNOSIS — N3000 Acute cystitis without hematuria: Secondary | ICD-10-CM | POA: Diagnosis not present

## 2023-03-28 LAB — POC URINALSYSI DIPSTICK (AUTOMATED)
Bilirubin, UA: NEGATIVE
Blood, UA: NEGATIVE
Glucose, UA: NEGATIVE
Ketones, UA: NEGATIVE
Nitrite, UA: NEGATIVE
Protein, UA: POSITIVE — AB
Spec Grav, UA: 1.025 (ref 1.010–1.025)
Urobilinogen, UA: 0.2 E.U./dL
pH, UA: 5.5 (ref 5.0–8.0)

## 2023-03-28 MED ORDER — FLUCONAZOLE 150 MG PO TABS
150.0000 mg | ORAL_TABLET | Freq: Once | ORAL | 0 refills | Status: AC
Start: 2023-03-28 — End: 2023-03-28

## 2023-03-28 MED ORDER — NITROFURANTOIN MONOHYD MACRO 100 MG PO CAPS
100.0000 mg | ORAL_CAPSULE | Freq: Two times a day (BID) | ORAL | 0 refills | Status: AC
Start: 2023-03-28 — End: 2023-04-02

## 2023-03-28 NOTE — Progress Notes (Signed)
Established Patient Office Visit     CC/Reason for Visit: dysuria and back pain  HPI: Tracy [name] is a 77 y.o. female who is coming in today for the above mentioned reasons. For about 1 week has been having right flank pain and dysuria. 3 days ago started experiencing urinary frequency. Then started having vaginal itching with a white discharge. Tried OTC cream without much relief.   Past Medical/Surgical History: Past Medical History:  Diagnosis Date   Allergic rhinitis    Allergy    Anxiety    Arthritis    Ascending aorta dilation (HCC) 09/13/2022   Echo 09/12/2022: EF 50-55, GR 1 DD, mildly reduced RVSF, normal PASP (RVSP 17.7), mild dilation of ascending aorta (38 mm), RAP 3   Breast cancer (HCC) 04/28/2020   rigtht breast   Bronchitis    CAD (coronary artery disease)    1/19 PCI/DES to pLCX for ISR, normal EF.    Cancer (HCC)    hx of precancerous cells in right breast    Cataract    Cervical dysplasia    unsure of procedure, possible "burning" in her late 63s   CHF (congestive heart failure) (HCC)    Echo 06/2019: EF 55-60, elevated LVEDP, normal RV SF, mild MAC, mild MR, trivial TR   Complication of anesthesia    COPD (chronic obstructive pulmonary disease) (HCC)    early    Depression    Dyspnea    with exertion    Family history of adverse reaction to anesthesia    daughter- problems wiht n/v   GERD (gastroesophageal reflux disease)    Gout    Headache(784.0)    Heart murmur    hx of years ago    Hyperlipidemia    Hypertension    Low back pain    Menopausal syndrome    Myocardial infarct (HCC) 2007   hx of   Overactive bladder    PONV (postoperative nausea and vomiting)    Sleep apnea     Past Surgical History:  Procedure Laterality Date   ANGIOPLASTY     stent 2007   BREAST LUMPECTOMY WITH RADIOACTIVE SEED LOCALIZATION Right 04/28/2020   Procedure: RIGHT BREAST LUMPECTOMY X 2  WITH RADIOACTIVE SEED LOCALIZATION;  Surgeon: Manus Rudd, MD;  Location: [facility];  Service: General;  Laterality: Right;  LMA   CARDIAC CATHETERIZATION     COLONOSCOPY     CORONARY STENT INTERVENTION N/A 11/06/2017   Procedure: CORONARY STENT INTERVENTION;  Surgeon: Yvonne Kendall, MD;  Location: [facility];  Service: Cardiovascular;  Laterality: N/A;   LEFT HEART CATH AND CORONARY ANGIOGRAPHY N/A 11/06/2017   Procedure: LEFT HEART CATH AND CORONARY ANGIOGRAPHY;  Surgeon: Yvonne Kendall, MD;  Location: [facility];  Service: Cardiovascular;  Laterality: N/A;   LEFT HEART CATH AND CORONARY ANGIOGRAPHY N/A 07/24/2019   Procedure: LEFT HEART CATH AND CORONARY ANGIOGRAPHY;  Surgeon: Kathleene Hazel, MD;  Location: [facility];  Service: Cardiovascular;  Laterality: N/A;   RIGHT/LEFT HEART CATH AND CORONARY ANGIOGRAPHY N/A 09/29/2020   Procedure: RIGHT/LEFT HEART CATH AND CORONARY ANGIOGRAPHY;  Surgeon: Lyn Records, MD;  Location: [facility];  Service: Cardiovascular;  Laterality: N/A;   ROBOTIC ASSISTED BILATERAL SALPINGO OOPHERECTOMY Bilateral 11/10/2020   Procedure: XI ROBOTIC ASSISTED BILATERAL SALPINGO OOPHORECTOMY WITH MINI LAPAROTOMY FOR DRAINAGE;  Surgeon: Carver Fila, MD;  Location: [facility];  Service: Gynecology;  Laterality: Bilateral;  MINI  LAP FIRST   TONSILLECTOMY     VULVECTOMY N/A 11/10/2020   Procedure: WIDE EXCISION VULVECTOMY;  Surgeon: Carver Fila, MD;  Location: [facility];  Service: Gynecology;  Laterality: N/A;    Social History:  reports that she quit smoking about 6 years ago. Her smoking use included cigarettes. She has a 65.00 pack-year smoking history. She has never used smokeless tobacco. She reports current alcohol use. She reports that she does not use drugs.  Allergies: Allergies  Allergen Reactions   Erythromycin Rash    But isn't certain   Sulfamethoxazole Rash    Family History:  Family History  Adopted: Yes  Problem Relation Age of  Onset   Colon cancer Mother 57   Cancer Mother        colon   Depression Mother        Suicide.   Alzheimer's disease Maternal Uncle    Diabetes Daughter        type 2   Other Other        patient is adopted   Stomach cancer Neg Hx      Current Outpatient Medications:    acetaminophen (TYLENOL) 500 MG tablet, Take 500-1,000 mg by mouth every 6 (six) hours as needed for headache. , Disp: , Rfl:    albuterol (VENTOLIN HFA) 108 (90 Base) MCG/ACT inhaler, Inhale 2 puffs into the lungs every 4 (four) hours as needed for wheezing., Disp: 1 each, Rfl: 3   allopurinol (ZYLOPRIM) 100 MG tablet, TAKE 1 TABLET BY MOUTH EVERY DAY, Disp: 90 tablet, Rfl: 2   amLODipine (NORVASC) 5 MG tablet, TAKE 1 TABLET (5 MG TOTAL) BY MOUTH DAILY., Disp: 90 tablet, Rfl: 3   APPLE CIDER VINEGAR PO, Take 2,400 mg by mouth daily., Disp: , Rfl:    ASPERCREME LIDOCAINE EX, Apply 1 application topically daily as needed (pain)., Disp: , Rfl:    aspirin EC 81 MG tablet, Take 81 mg by mouth daily., Disp: , Rfl:    Cholecalciferol (VITAMIN D3) 50 MCG (2000 UT) TABS, Take 4,000 Units by mouth daily. , Disp: , Rfl:    clopidogrel (PLAVIX) 75 MG tablet, TAKE 1 TABLET BY MOUTH EVERY DAY WITH BREAKFAST, Disp: 90 tablet, Rfl: 2   Cyanocobalamin (VITAMIN B-12) 5000 MCG SUBL, Place 5,000 mcg under the tongue daily. , Disp: , Rfl:    diclofenac Sodium (VOLTAREN) 1 % GEL, APPLY 2 GRAMS TOPICALLY 4 TIMES A DAY AS NEEDED, Disp: 100 g, Rfl: 2   ezetimibe (ZETIA) 10 MG tablet, TAKE ONE TABLET BY MOUTH DAILY, Disp: 90 tablet, Rfl: 3   fluconazole (DIFLUCAN) 150 MG tablet, Take 1 tablet (150 mg total) by mouth once for 1 dose., Disp: 1 tablet, Rfl: 0   fluticasone (FLONASE) 50 MCG/ACT nasal spray, SPRAY 2 SPRAYS INTO EACH NOSTRIL EVERY DAY, Disp: 48 mL, Rfl: 1   furosemide (LASIX) 40 MG tablet, Take 40 mg by mouth daily., Disp: , Rfl:    guaiFENesin (MUCINEX) 600 MG 12 hr tablet, Take 600 mg by mouth 2 (two) times daily as needed  (congestion)., Disp: , Rfl:    isosorbide mononitrate (IMDUR) 30 MG 24 hr tablet, TAKE 1 TABLET BY MOUTH EVERY DAY, Disp: 90 tablet, Rfl: 3   losartan (COZAAR) 100 MG tablet, TAKE 1 TABLET BY MOUTH EVERY DAY, Disp: 90 tablet, Rfl: 3   metoprolol succinate (TOPROL-XL) 100 MG 24 hr tablet, TAKE 1 TABLET BY MOUTH EVERY DAY WITH OR IMMEDIATELY FOLLOWING A MEAL, Disp: 90 tablet, Rfl:  3   Misc Natural Products (TART CHERRY ADVANCED) CAPS, Take 1,000 capsules by mouth daily. , Disp: , Rfl:    nitrofurantoin, macrocrystal-monohydrate, (MACROBID) 100 MG capsule, Take 1 capsule (100 mg total) by mouth 2 (two) times daily for 5 days., Disp: 10 capsule, Rfl: 0   nitroGLYCERIN (NITROSTAT) 0.4 MG SL tablet, PLACE 1 TABLET UNDER THE TONGUE EVERY 5 MINUTES AS NEEDED FOR CHEST PAIN, Disp: 25 tablet, Rfl: 6   nystatin cream (MYCOSTATIN), Apply 1 Application topically 2 (two) times daily., Disp: 30 g, Rfl: 2   ondansetron (ZOFRAN-ODT) 4 MG disintegrating tablet, 4mg  ODT q4 hours prn nausea/vomit, Disp: 20 tablet, Rfl: 0   oxymetazoline (AFRIN) 0.05 % nasal spray, Place 1 spray into both nostrils 2 (two) times daily as needed for congestion., Disp: , Rfl:    pantoprazole (PROTONIX) 40 MG tablet, TAKE 1 TABLET BY MOUTH EVERY DAY, Disp: 90 tablet, Rfl: 2   potassium chloride (KLOR-CON M) 10 MEQ tablet, Take 1 tablet (10 mEq total) by mouth 2 (two) times daily., Disp: 180 tablet, Rfl: 3   pravastatin (PRAVACHOL) 40 MG tablet, TAKE 1 TABLET BY MOUTH EVERY DAY IN THE EVENING, Disp: 90 tablet, Rfl: 3   Simethicone (GAS-X PO), Take 2 tablets by mouth daily as needed (gas)., Disp: , Rfl:    spironolactone (ALDACTONE) 25 MG tablet, Take 0.5 tablets (12.5 mg total) by mouth daily., Disp: 45 tablet, Rfl: 3   terconazole (TERAZOL 7) 0.4 % vaginal cream, Place 1 applicator vaginally at bedtime., Disp: 45 g, Rfl: 0   triamcinolone cream (KENALOG) 0.1 %, APPLY TO AFFECTED AREA TWICE A DAY, Disp: 30 g, Rfl: 1  Current  Facility-Administered Medications:    0.9 %  sodium chloride infusion, 500 mL, Intravenous, Once, Hilarie Fredrickson, MD  Review of Systems:  Negative unless indicated in HPI.   Physical Exam: Vitals:   03/28/23 1554 03/28/23 1601  BP: (!) 140/90 138/82  Pulse: 89   Temp: 97.8 F (36.6 C)   TempSrc: Oral   SpO2: 96%     There is no height or weight on file to calculate BMI.   Physical Exam Vitals reviewed.  Constitutional:      Appearance: Normal appearance.  HENT:     Head: Normocephalic and atraumatic.  Eyes:     Conjunctiva/sclera: Conjunctivae normal.     Pupils: Pupils are equal, round, and reactive to light.  Skin:    General: Skin is warm and dry.  Neurological:     General: No focal deficit present.     Mental Status: She is alert and oriented to person, place, and time.  Psychiatric:        Mood and Affect: Mood normal.        Behavior: Behavior normal.        Thought Content: Thought content normal.        Judgment: Judgment normal.      Impression and Plan:  Acute cystitis without hematuria -     Nitrofurantoin Monohyd Macro; Take 1 capsule (100 mg total) by mouth 2 (two) times daily for 5 days.  Dispense: 10 capsule; Refill: 0  Dysuria -     POCT Urinalysis Dipstick (Automated) -     Urinalysis; Future -     Urine Culture; Future  Vaginal yeast infection -     Fluconazole; Take 1 tablet (150 mg total) by mouth once for 1 dose.  Dispense: 1 tablet; Refill: 0  -In office urine dipstick with leuks.  Treat with macrobid (she has a sulfa allergy). -Suspect she also has a vaginal yeast infection, so will send diflucan x 1.   Time spent:22 minutes reviewing chart, interviewing and examining patient and formulating plan of care.     Chaya Jan, MD [facility]

## 2023-03-29 LAB — URINALYSIS, ROUTINE W REFLEX MICROSCOPIC
Bilirubin Urine: NEGATIVE
Hgb urine dipstick: NEGATIVE
Ketones, ur: NEGATIVE
Nitrite: NEGATIVE
Specific Gravity, Urine: 1.02 (ref 1.000–1.030)
Total Protein, Urine: NEGATIVE
Urine Glucose: NEGATIVE
Urobilinogen, UA: 0.2 (ref 0.0–1.0)
pH: 6 (ref 5.0–8.0)

## 2023-03-30 LAB — URINE CULTURE
MICRO NUMBER:: 15067985
SPECIMEN QUALITY:: ADEQUATE

## 2023-04-07 ENCOUNTER — Telehealth: Payer: Self-pay | Admitting: Family Medicine

## 2023-04-07 DIAGNOSIS — N898 Other specified noninflammatory disorders of vagina: Secondary | ICD-10-CM

## 2023-04-07 MED ORDER — TERCONAZOLE 0.4 % VA CREA
1.0000 | TOPICAL_CREAM | Freq: Every day | VAGINAL | 0 refills | Status: DC
Start: 2023-04-07 — End: 2023-09-29

## 2023-04-07 NOTE — Telephone Encounter (Signed)
Reviewing Ucx, bacteria is sensitive to Nitrofurantoin, so we need to consider other possible causes of burning and itching. ? Vulvovaginitis. She can try vaginal Terazol for 7-10 days. Monitor for new symptoms and follow if symptoms do not resolve. Thanks, BJ

## 2023-04-07 NOTE — Telephone Encounter (Signed)
Pt called to say she is still having a lot of burning and itching and really needs either another round of medication or something stronger to treat her UTI symptoms.  Pt states she called yesterday for the same thing, and never got a call back.   Please advise.

## 2023-04-07 NOTE — Telephone Encounter (Signed)
I spoke with patient. She will try the cream and let us know how she is doing on Monday.

## 2023-04-08 ENCOUNTER — Other Ambulatory Visit: Payer: Self-pay | Admitting: Cardiovascular Disease

## 2023-04-18 ENCOUNTER — Other Ambulatory Visit (HOSPITAL_COMMUNITY)
Admission: RE | Admit: 2023-04-18 | Discharge: 2023-04-18 | Disposition: A | Discharge status: Home / Self Care | Payer: Medicare Other | Source: Ambulatory Visit | Attending: Family Medicine | Admitting: Family Medicine

## 2023-04-18 ENCOUNTER — Encounter: Payer: Self-pay | Admitting: Family Medicine

## 2023-04-18 ENCOUNTER — Ambulatory Visit (INDEPENDENT_AMBULATORY_CARE_PROVIDER_SITE_OTHER): Payer: Medicare Other | Admitting: Family Medicine

## 2023-04-18 VITALS — BP 128/80 | HR 85 | Temp 98.1°F | Resp 16 | Ht 65.0 in

## 2023-04-18 DIAGNOSIS — N1831 Chronic kidney disease, stage 3a: Secondary | ICD-10-CM

## 2023-04-18 DIAGNOSIS — M545 Low back pain, unspecified: Secondary | ICD-10-CM | POA: Diagnosis not present

## 2023-04-18 DIAGNOSIS — R251 Tremor, unspecified: Secondary | ICD-10-CM | POA: Diagnosis not present

## 2023-04-18 DIAGNOSIS — L304 Erythema intertrigo: Secondary | ICD-10-CM | POA: Diagnosis not present

## 2023-04-18 DIAGNOSIS — N766 Ulceration of vulva: Secondary | ICD-10-CM

## 2023-04-18 DIAGNOSIS — R300 Dysuria: Secondary | ICD-10-CM | POA: Diagnosis not present

## 2023-04-18 LAB — CBC WITH DIFFERENTIAL/PLATELET
Basophils Absolute: 0.1 10*3/uL (ref 0.0–0.1)
Basophils Relative: 0.8 % (ref 0.0–3.0)
Eosinophils Absolute: 0.3 10*3/uL (ref 0.0–0.7)
Eosinophils Relative: 3.4 % (ref 0.0–5.0)
HCT: 38.4 % (ref 36.0–46.0)
Hemoglobin: 11.9 g/dL — ABNORMAL LOW (ref 12.0–15.0)
Lymphocytes Relative: 29.8 % (ref 12.0–46.0)
Lymphs Abs: 2.6 10*3/uL (ref 0.7–4.0)
MCHC: 31.1 g/dL (ref 30.0–36.0)
MCV: 84.4 fl (ref 78.0–100.0)
Monocytes Absolute: 1 10*3/uL (ref 0.1–1.0)
Monocytes Relative: 11.3 % (ref 3.0–12.0)
Neutro Abs: 4.8 10*3/uL (ref 1.4–7.7)
Neutrophils Relative %: 54.7 % (ref 43.0–77.0)
Platelets: 233 10*3/uL (ref 150.0–400.0)
RBC: 4.55 Mil/uL (ref 3.87–5.11)
RDW: 17.3 % — ABNORMAL HIGH (ref 11.5–15.5)
WBC: 8.7 10*3/uL (ref 4.0–10.5)

## 2023-04-18 LAB — POC URINALSYSI DIPSTICK (AUTOMATED)
Bilirubin, UA: POSITIVE
Blood, UA: POSITIVE
Glucose, UA: NEGATIVE
Ketones, UA: NEGATIVE
Nitrite, UA: POSITIVE
Protein, UA: POSITIVE — AB
Spec Grav, UA: 1.02 (ref 1.010–1.025)
Urobilinogen, UA: 2 E.U./dL — AB
pH, UA: 6 (ref 5.0–8.0)

## 2023-04-18 LAB — BASIC METABOLIC PANEL
BUN: 34 mg/dL — ABNORMAL HIGH (ref 6–23)
CO2: 25 mEq/L (ref 19–32)
Calcium: 9.6 mg/dL (ref 8.4–10.5)
Chloride: 106 mEq/L (ref 96–112)
Creatinine, Ser: 2.55 mg/dL — ABNORMAL HIGH (ref 0.40–1.20)
GFR: 17.98 mL/min — ABNORMAL LOW (ref >60.00)
Glucose, Bld: 120 mg/dL — ABNORMAL HIGH (ref 70–99)
Potassium: 4.8 mEq/L (ref 3.5–5.1)
Sodium: 141 mEq/L (ref 135–145)

## 2023-04-18 LAB — C-REACTIVE PROTEIN: CRP: 1.1 mg/dL (ref 0.5–20.0)

## 2023-04-18 MED ORDER — PREDNISONE 20 MG PO TABS
ORAL_TABLET | ORAL | 0 refills | Status: DC
Start: 2023-04-18 — End: 2023-05-10

## 2023-04-18 MED ORDER — NYSTATIN-TRIAMCINOLONE 100000-0.1 UNIT/GM-% EX CREA
1.0000 | TOPICAL_CREAM | Freq: Two times a day (BID) | CUTANEOUS | 0 refills | Status: AC
Start: 2023-04-18 — End: 2023-05-02

## 2023-04-18 NOTE — Assessment & Plan Note (Addendum)
Complaining of 3 to 4 weeks of lower back pain, which she reported today as a new problem.  Lumbar imaging ordered.  Associated posterior thigh pain and tingling sensation,?  Radiculopathy. After discussion of some side effects, she agrees with prednisone taper. Recommend taking medication with breakfast. If lower extremity pain and daily sensation do not resolve in 4 to 6 weeks, she is going to need lumbar MRI and Ortho evaluation. Fall precautions discussed.

## 2023-04-18 NOTE — Assessment & Plan Note (Signed)
This is a recurrent problem. No signs of bacterial infection. We discussed diagnosis, prognosis, and treatment options. Continue trying to keep area dry, pull-ups she is wearing at this time may aggravate problem.She can try not wearing underwear at night. Continue keeping area clean with antibacterial soap and water.

## 2023-04-18 NOTE — Progress Notes (Signed)
ACUTE VISIT Chief Complaint  Patient presents with   Follow-up    Still having pain from UTI, completed abx treatment. Does have some sores on the inside, has been using OTC urinary pain relief. Has been having shaking and unable to get around as well.    HPI: Ms.Josslynn L [place] is a 77 y.o. female with PMHx significant for CAD, hypertension,OSA, COPD, CKD 3, GERD, and unstable gait here today complaining of persistent intermittent dysuria and pruritus.  Evaluated for similar symptoms on 03/28/2023.  She was treated with Macrobid 100 mg twice daily x 5 days. She describes a burning sensation during urination and has noticed sores in the genital area since her last visit.  She denies history of genital ulcers, reports frequent oral aphthous.  She denies any history of herpes simplex.  She denies urinary frequency or gross hematuria but reports bright orange urine and blood on tissue upon wiping ans on diaper.  She also complains of right posterior hip pain bilaterally, and significant back pain, all of which started with the onset of her urinary symptoms.    She has a history of intertrigo on her arms and has been experiencing pain under her arms as well. She has also had canker sores in her mouth since her teenage years. SPECIMEN QUALITY: Adequate  Sample Source NOT GIVEN  STATUS: FINAL  ISOLATE 1: Escherichia coli Abnormal   Comment: Greater than 100,000 CFU/mL of Escherichia coli  Resulting Agency [facility] [place]     Susceptibility   Escherichia coli    URINE CULTURE, REFLEX    AMOX/CLAVULANIC <=2 Sensitive    AMPICILLIN 4 Sensitive    AMPICILLIN/SULBACTAM <=2 Sensitive    CEFAZOLIN <=4 Not Reportable 1    CEFEPIME <=1 Sensitive    CEFTAZIDIME <=1 Sensitive    CEFTRIAXONE <=1 Sensitive    CIPROFLOXACIN <=0.25 Sensitive    GENTAMICIN <=1 Sensitive    IMIPENEM <=0.25 Sensitive    LEVOFLOXACIN <=0.12 Sensitive    NITROFURANTOIN <=16 Sensitive    PIP/TAZO  <=4 Sensitive    TOBRAMYCIN <=1 Sensitive    TRIMETH/SULFA <=20 Sensitive 2          She is also c/o severe lower back and posterior hip and thighs pain. Pain is constant, exacerbated by movement after prolonged rest and alleviated after walking for a few minutes. Currently pain is 5/10.  She is reporting this pain as new and going on for about 4 weeks. Negative for saddle anesthesia. No history of trauma or unusual physical activity.  She is currently taking Tylenol Arthritis for pain management and occasionally takes Aleve. CKD 3: She has not noted form in urine or decreased urine output.  Lab Results  Component Value Date   CREATININE 1.21 (H) 12/12/2022   BUN 29 (H) 12/12/2022   NA 143 12/12/2022   K 4.7 12/12/2022   CL 106 12/12/2022   CO2 20 12/12/2022   Lab Results  Component Value Date   WBC 11.2 (H) 10/26/2022   HGB 12.1 10/26/2022   HCT 37.8 10/26/2022   MCV 87.8 10/26/2022   PLT 329.0 10/26/2022   She also mentions that her hands are "shaking" L>R. She has a history of tremor and has previously been evaluated by neurologist and according to patient, she did not have signs of Parkinson's disease.  Negative for new or associated symptoms.  Review of Systems  Constitutional:  Positive for fatigue. Negative for chills and fever.  HENT:  Negative for sore throat.  Respiratory:  Negative for cough and wheezing.   Gastrointestinal:  Negative for abdominal pain, nausea and vomiting.  Genitourinary:  Positive for genital sores. Negative for pelvic pain and vaginal discharge.  Musculoskeletal:  Positive for arthralgias, back pain and gait problem.  Neurological:  Negative for syncope and facial asymmetry.  Psychiatric/Behavioral:  Negative for confusion. The patient is nervous/anxious.   See other pertinent positives and negatives in HPI.  Current Outpatient Medications on File Prior to Visit  Medication Sig Dispense Refill   acetaminophen (TYLENOL) 500 MG tablet  Take 500-1,000 mg by mouth every 6 (six) hours as needed for headache.      albuterol (VENTOLIN HFA) 108 (90 Base) MCG/ACT inhaler Inhale 2 puffs into the lungs every 4 (four) hours as needed for wheezing. 1 each 3   allopurinol (ZYLOPRIM) 100 MG tablet TAKE 1 TABLET BY MOUTH EVERY DAY 90 tablet 2   amLODipine (NORVASC) 5 MG tablet TAKE 1 TABLET (5 MG TOTAL) BY MOUTH DAILY. 90 tablet 3   APPLE CIDER VINEGAR PO Take 2,400 mg by mouth daily.     ASPERCREME LIDOCAINE EX Apply 1 application topically daily as needed (pain).     aspirin EC 81 MG tablet Take 81 mg by mouth daily.     Cholecalciferol (VITAMIN D3) 50 MCG (2000 UT) TABS Take 4,000 Units by mouth daily.      clopidogrel (PLAVIX) 75 MG tablet TAKE 1 TABLET BY MOUTH EVERY DAY WITH BREAKFAST 90 tablet 2   Cyanocobalamin (VITAMIN B-12) 5000 MCG SUBL Place 5,000 mcg under the tongue daily.      diclofenac Sodium (VOLTAREN) 1 % GEL APPLY 2 GRAMS TOPICALLY 4 TIMES A DAY AS NEEDED 100 g 2   ezetimibe (ZETIA) 10 MG tablet TAKE ONE TABLET BY MOUTH DAILY 90 tablet 3   fluticasone (FLONASE) 50 MCG/ACT nasal spray SPRAY 2 SPRAYS INTO EACH NOSTRIL EVERY DAY 48 mL 1   furosemide (LASIX) 40 MG tablet Take 40 mg by mouth daily.     guaiFENesin (MUCINEX) 600 MG 12 hr tablet Take 600 mg by mouth 2 (two) times daily as needed (congestion).     isosorbide mononitrate (IMDUR) 30 MG 24 hr tablet TAKE 1 TABLET BY MOUTH EVERY DAY 90 tablet 3   losartan (COZAAR) 100 MG tablet TAKE 1 TABLET BY MOUTH EVERY DAY 90 tablet 3   metoprolol succinate (TOPROL-XL) 100 MG 24 hr tablet TAKE 1 TABLET BY MOUTH EVERY DAY WITH OR IMMEDIATELY FOLLOWING A MEAL 90 tablet 3   Misc Natural Products (TART CHERRY ADVANCED) CAPS Take 1,000 capsules by mouth daily.      nitroGLYCERIN (NITROSTAT) 0.4 MG SL tablet PLACE 1 TABLET UNDER THE TONGUE EVERY 5 MINUTES AS NEEDED FOR CHEST PAIN 25 tablet 6   nystatin cream (MYCOSTATIN) Apply 1 Application topically 2 (two) times daily. 30 g 2    ondansetron (ZOFRAN-ODT) 4 MG disintegrating tablet 4mg  ODT q4 hours prn nausea/vomit 20 tablet 0   oxymetazoline (AFRIN) 0.05 % nasal spray Place 1 spray into both nostrils 2 (two) times daily as needed for congestion.     pantoprazole (PROTONIX) 40 MG tablet TAKE 1 TABLET BY MOUTH EVERY DAY 90 tablet 2   potassium chloride (KLOR-CON M) 10 MEQ tablet Take 1 tablet (10 mEq total) by mouth 2 (two) times daily. 180 tablet 3   pravastatin (PRAVACHOL) 40 MG tablet TAKE 1 TABLET BY MOUTH EVERY DAY IN THE EVENING 90 tablet 3   Simethicone (GAS-X PO) Take 2 tablets  by mouth daily as needed (gas).     spironolactone (ALDACTONE) 25 MG tablet Take 0.5 tablets (12.5 mg total) by mouth daily. 45 tablet 3   terconazole (TERAZOL 7) 0.4 % vaginal cream Place 1 applicator vaginally at bedtime. 45 g 0   triamcinolone cream (KENALOG) 0.1 % APPLY TO AFFECTED AREA TWICE A DAY 30 g 1   Current Facility-Administered Medications on File Prior to Visit  Medication Dose Route Frequency Provider Last Rate Last Admin   0.9 %  sodium chloride infusion  500 mL Intravenous Once Hilarie Fredrickson, MD        Past Medical History:  Diagnosis Date   Allergic rhinitis    Allergy    Anxiety    Arthritis    Ascending aorta dilation (HCC) 09/13/2022   Echo 09/12/2022: EF 50-55, GR 1 DD, mildly reduced RVSF, normal PASP (RVSP 17.7), mild dilation of ascending aorta (38 mm), RAP 3   Breast cancer (HCC) 04/28/2020   rigtht breast   Bronchitis    CAD (coronary artery disease)    1/19 PCI/DES to pLCX for ISR, normal EF.    Cancer (HCC)    hx of precancerous cells in right breast    Cataract    Cervical dysplasia    unsure of procedure, possible "burning" in her late 65s   CHF (congestive heart failure) (HCC)    Echo 06/2019: EF 55-60, elevated LVEDP, normal RV SF, mild MAC, mild MR, trivial TR   Complication of anesthesia    COPD (chronic obstructive pulmonary disease) (HCC)    early    Depression    Dyspnea    with  exertion    Family history of adverse reaction to anesthesia    daughter- problems wiht n/v   GERD (gastroesophageal reflux disease)    Gout    Headache(784.0)    Heart murmur    hx of years ago    Hyperlipidemia    Hypertension    Low back pain    Menopausal syndrome    Myocardial infarct (HCC) 2007   hx of   Overactive bladder    PONV (postoperative nausea and vomiting)    Sleep apnea    Allergies  Allergen Reactions   Erythromycin Rash    But isn't certain   Sulfamethoxazole Rash   Social History   Socioeconomic History   Marital status: Married    Spouse name: Not on file   Number of children: 2   Years of education: Not on file   Highest education level: GED or equivalent  Occupational History   Occupation: retired   Occupation: retired    Comment: Production designer, theatre/television/film  Tobacco Use   Smoking status: Former    Packs/day: 1.25    Years: 52.00    Additional pack years: 0.00    Total pack years: 65.00    Types: Cigarettes    Quit date: 2018    Years since quitting: 6.5   Smokeless tobacco: Never   Tobacco comments:    completely quit May of 2018; period of years she did not smoke   Vaping Use   Vaping Use: Never used  Substance and Sexual Activity   Alcohol use: Yes    Comment: seldom   Drug use: No   Sexual activity: Not Currently  Other Topics Concern   Not on file  Social History Narrative   Not on file   Social Determinants of Health   Financial Resource Strain: Low Risk  (10/25/2022)  Overall Financial Resource Strain (CARDIA)    Difficulty of Paying Living Expenses: Not very hard  Food Insecurity: Food Insecurity Present (10/25/2022)   Hunger Vital Sign    Worried About Running Out of Food in the Last Year: Sometimes true    Ran Out of Food in the Last Year: Sometimes true  Transportation Needs: No Transportation Needs (10/25/2022)   PRAPARE - Administrator, Civil Service (Medical): No    Lack of Transportation (Non-Medical): No   Physical Activity: Unknown (10/25/2022)   Exercise Vital Sign    Days of Exercise per Week: 0 days    Minutes of Exercise per Session: Not on file  Recent Concern: Physical Activity - Inactive (10/25/2022)   Exercise Vital Sign    Days of Exercise per Week: 0 days    Minutes of Exercise per Session: 20 min  Stress: Stress Concern Present (10/25/2022)   [employer] of Occupational Health - Occupational Stress Questionnaire    Feeling of Stress : Rather much  Social Connections: Unknown (10/25/2022)   Social Connection and Isolation Panel [NHANES]    Frequency of Communication with Friends and Family: Three times a week    Frequency of Social Gatherings with Friends and Family: Patient declined    Attends Religious Services: Patient declined    Active Member of Clubs or Organizations: No    Attends Engineer, structural: Not on file    Marital Status: Married    Vitals:   04/18/23 1104  BP: 128/80  Pulse: 85  Resp: 16  Temp: 98.1 F (36.7 C)  SpO2: 94%   Body mass index is 37.32 kg/m.  Physical Exam Vitals and nursing note reviewed. Exam conducted with a chaperone present.  Constitutional:      General: She is not in acute distress.    Appearance: She is well-developed.  HENT:     Head: Normocephalic and atraumatic.  Eyes:     Conjunctiva/sclera: Conjunctivae normal.  Cardiovascular:     Rate and Rhythm: Normal rate and regular rhythm.     Comments: Trace LE edema RLE and 1+ LLE Pulmonary:     Effort: Pulmonary effort is normal. No respiratory distress.     Breath sounds: Normal breath sounds.  Abdominal:     Palpations: Abdomen is soft. There is no mass.     Tenderness: There is no abdominal tenderness.  Genitourinary:    Exam position: Lithotomy position.     Vagina: No erythema or tenderness.     Cervix: Discharge (Small amount of whitish discharge.) present. No cervical motion tenderness, friability, erythema or cervical bleeding.     Comments:  Superficial ulcer on posterior aspect of vaginal introitus with one papular lesion. Tender. Lymphadenopathy:     Cervical: No cervical adenopathy.  Skin:    General: Skin is warm.     Findings: Erythema and rash present. Rash is macular and papular.          Comments: Maculopapular erythematous rash affecting inguinal area.  No induration or tenderness.  Neurological:     Mental Status: She is alert and oriented to person, place, and time.     Motor: Tremor (Hands with intension) present.     Comments: Unstable and antalgic gait assisted with a cane.  Psychiatric:        Mood and Affect: Mood is anxious.   ASSESSMENT AND PLAN:  Ms. Dumitrescu was seen today for recurrent GU symptoms and back pain.  Lab Results  Component  Value Date   CREATININE 2.55 (H) 04/18/2023   BUN 34 (H) 04/18/2023   NA 141 04/18/2023   K 4.8 04/18/2023   CL 106 04/18/2023   CO2 25 04/18/2023   Lab Results  Component Value Date   WBC 8.7 04/18/2023   HGB 11.9 (L) 04/18/2023   HCT 38.4 04/18/2023   MCV 84.4 04/18/2023   PLT 233.0 04/18/2023   Lab Results  Component Value Date   CRP 1.1 04/18/2023   Bilateral low back pain, unspecified chronicity, unspecified whether sciatica present Assessment & Plan: Complaining of 3 to 4 weeks of lower back pain, which she reported today as a new problem.  Lumbar imaging ordered.  Associated posterior thigh pain and tingling sensation,?  Radiculopathy. After discussion of some side effects, she agrees with prednisone taper. Recommend taking medication with breakfast. If lower extremity pain and daily sensation do not resolve in 4 to 6 weeks, she is going to need lumbar MRI and Ortho evaluation. Fall precautions discussed.  Orders: -     predniSONE; 2 tabs for 5 days, 1 tabs for 3 days, and 1/2 tab for 3 days. Take tables together with breakfast.  Dispense: 15 tablet; Refill: 0 -     DG Lumbar Spine Complete; Future  Stage 3a chronic kidney disease  (HCC) Assessment & Plan: Last Cr and e GFR improved, closer to her baseline. She has been taking Aleve for lower back and hip pain, we discussed side effects.  Recommend avoiding NSAIDs, continue low-salt diet and adequate hydration. Currently he is on losartan 100 mg daily. We are checking BMP today.  Orders: -     Basic metabolic panel; Future  Genital ulcer, female -     C-reactive protein; Future -     CBC with Differential/Platelet; Future -     Nystatin-Triamcinolone; Apply 1 Application topically 2 (two) times daily for 14 days.  Dispense: 28 g; Refill: 0 -     Cervicovaginal ancillary only  Dysuria We discussed possible etiologies. She was treated medically for UTI and urine culture grew bacteria sensitive to antibiotic that was prescribed. Genital ulcer could explain most of the symptoms she is reporting today. I do not think we need to repeat antibiotic treatment at this time.  -     POCT Urinalysis Dipstick (Automated)  Intertrigo Assessment & Plan: This is a recurrent problem. No signs of bacterial infection. We discussed diagnosis, prognosis, and treatment options. Continue trying to keep area dry, pull-ups she is wearing at this time may aggravate problem.She can try not wearing underwear at night. Continue keeping area clean with antibacterial soap and water.   Tremor of both hands Assessment & Plan: This is a chronic problem. She has been evaluated by neurologist and according to the patient Parkinson disease was ruled out. We discussed possible etiologies, most likely essential tremor. Continue monitoring for new symptoms. If problem gets significantly worse, she may need to be reevaluated by neurologist.  Return in about 4 weeks (around 05/16/2023).  Raad Clayson G. [place], MD  [facility]. [place] office.

## 2023-04-18 NOTE — Assessment & Plan Note (Addendum)
Last Cr and e GFR improved, closer to her baseline. She has been taking Aleve for lower back and hip pain, we discussed side effects.  Recommend avoiding NSAIDs, continue low-salt diet and adequate hydration. Currently he is on losartan 100 mg daily. We are checking BMP today.

## 2023-04-18 NOTE — Patient Instructions (Addendum)
A few things to remember from today's visit:  Gross hematuria - Plan: CBC with Differential/Platelet  Bilateral low back pain, unspecified chronicity, unspecified whether sciatica present  Stage 3a chronic kidney disease (HCC) - Plan: Basic metabolic panel  Genital ulcer, female - Plan: C-reactive protein, CBC with Differential/Platelet  If pain and tingling of thighs continues in 4-6 weeks we will need lumbar MRI. Cream, small amount on genital ulcer for 14 days. Prednisone with breakfast.  If you need refills for medications you take chronically, please call your pharmacy. Do not use My Chart to request refills or for acute issues that need immediate attention. If you send a my chart message, it may take a few days to be addressed, specially if I am not in the office.  Please be sure medication list is accurate. If a new problem present, please set up appointment sooner than planned today.

## 2023-04-18 NOTE — Assessment & Plan Note (Signed)
This is a chronic problem. She has been evaluated by neurologist and according to the patient Parkinson disease was ruled out. We discussed possible etiologies, most likely essential tremor. Continue monitoring for new symptoms. If problem gets significantly worse, she may need to be reevaluated by neurologist.

## 2023-04-19 ENCOUNTER — Other Ambulatory Visit: Payer: Medicare Other

## 2023-04-19 ENCOUNTER — Other Ambulatory Visit: Payer: Self-pay

## 2023-04-19 ENCOUNTER — Ambulatory Visit (INDEPENDENT_AMBULATORY_CARE_PROVIDER_SITE_OTHER): Payer: Medicare Other

## 2023-04-19 DIAGNOSIS — M4316 Spondylolisthesis, lumbar region: Secondary | ICD-10-CM | POA: Diagnosis not present

## 2023-04-19 DIAGNOSIS — N1831 Chronic kidney disease, stage 3a: Secondary | ICD-10-CM

## 2023-04-19 DIAGNOSIS — M47816 Spondylosis without myelopathy or radiculopathy, lumbar region: Secondary | ICD-10-CM | POA: Diagnosis not present

## 2023-04-19 DIAGNOSIS — M545 Low back pain, unspecified: Secondary | ICD-10-CM | POA: Diagnosis not present

## 2023-04-19 DIAGNOSIS — M438X6 Other specified deforming dorsopathies, lumbar region: Secondary | ICD-10-CM | POA: Diagnosis not present

## 2023-04-19 DIAGNOSIS — M79659 Pain in unspecified thigh: Secondary | ICD-10-CM | POA: Diagnosis not present

## 2023-04-19 LAB — CERVICOVAGINAL ANCILLARY ONLY
Bacterial Vaginitis (gardnerella): NEGATIVE
Candida Glabrata: NEGATIVE
Candida Vaginitis: NEGATIVE
Comment: NEGATIVE
Comment: NEGATIVE
Comment: NEGATIVE

## 2023-04-21 ENCOUNTER — Other Ambulatory Visit (INDEPENDENT_AMBULATORY_CARE_PROVIDER_SITE_OTHER): Payer: Medicare Other

## 2023-04-21 DIAGNOSIS — N1831 Chronic kidney disease, stage 3a: Secondary | ICD-10-CM | POA: Diagnosis not present

## 2023-04-21 LAB — BASIC METABOLIC PANEL
BUN: 47 mg/dL — ABNORMAL HIGH (ref 6–23)
CO2: 24 mEq/L (ref 19–32)
Calcium: 9.4 mg/dL (ref 8.4–10.5)
Chloride: 106 mEq/L (ref 96–112)
Creatinine, Ser: 2.13 mg/dL — ABNORMAL HIGH (ref 0.40–1.20)
GFR: 22.32 mL/min — ABNORMAL LOW (ref >60.00)
Glucose, Bld: 139 mg/dL — ABNORMAL HIGH (ref 70–99)
Potassium: 4.8 mEq/L (ref 3.5–5.1)
Sodium: 139 mEq/L (ref 135–145)

## 2023-04-24 ENCOUNTER — Other Ambulatory Visit: Payer: Self-pay | Admitting: Family Medicine

## 2023-04-24 ENCOUNTER — Other Ambulatory Visit: Payer: Self-pay

## 2023-04-24 ENCOUNTER — Telehealth: Payer: Self-pay

## 2023-04-24 DIAGNOSIS — N1831 Chronic kidney disease, stage 3a: Secondary | ICD-10-CM

## 2023-04-24 DIAGNOSIS — G8929 Other chronic pain: Secondary | ICD-10-CM

## 2023-04-24 MED ORDER — TRAMADOL HCL 50 MG PO TABS
50.0000 mg | ORAL_TABLET | Freq: Two times a day (BID) | ORAL | 0 refills | Status: DC | PRN
Start: 2023-04-24 — End: 2023-06-06

## 2023-04-24 NOTE — Telephone Encounter (Signed)
I called and spoke with patient to go over her lab results. She is still in pain with her back and it is hard for her to walk. She is wanting to know if there is anything else you can send in to help with pain?

## 2023-04-24 NOTE — Telephone Encounter (Signed)
We did not discuss her back pain in great detail, we can do so next visit. Sent Rx for Tramadol 50 mg to take bid prn. It may cause drowsiness and constipation. We may need to consider imaging and/or ortho evaluation of pain get worse. Thanks, BJ

## 2023-04-25 NOTE — Telephone Encounter (Signed)
I called and spoke with patient. She is aware of message below & verbalized understanding. 

## 2023-04-26 DIAGNOSIS — M170 Bilateral primary osteoarthritis of knee: Secondary | ICD-10-CM | POA: Diagnosis not present

## 2023-05-10 ENCOUNTER — Ambulatory Visit: Payer: Medicare Other | Admitting: Family Medicine

## 2023-05-10 ENCOUNTER — Encounter: Payer: Self-pay | Admitting: Family Medicine

## 2023-05-10 VITALS — BP 124/84 | HR 104 | Temp 97.9°F | Wt 220.0 lb

## 2023-05-10 DIAGNOSIS — R42 Dizziness and giddiness: Secondary | ICD-10-CM | POA: Diagnosis not present

## 2023-05-10 DIAGNOSIS — J069 Acute upper respiratory infection, unspecified: Secondary | ICD-10-CM | POA: Diagnosis not present

## 2023-05-10 MED ORDER — METHYLPREDNISOLONE 4 MG PO TBPK: ORAL_TABLET | ORAL | 0 refills | Status: DC

## 2023-05-10 MED ORDER — MECLIZINE HCL 25 MG PO TABS: 25.0000 mg | ORAL_TABLET | Freq: Four times a day (QID) | ORAL | 0 refills | Status: DC | PRN

## 2023-05-10 NOTE — Progress Notes (Signed)
   Subjective:    Patient ID: Tracy [name], female    DOB: 11-20-1945, 77 y.o.   MRN: [id number]  HPI Here with her husband for 2 weeks of sinus congestion, PND, and frontal headaches. No fever or ST or cough. Using Mucinex DM. Then 2 days ago she developed dizziness whenever she moves her head quickly. This foes away when she is still. She was treated for vertigo several years ago, and she had PT for this which was successful.    Review of Systems  Constitutional: Negative.   HENT:  Positive for congestion, postnasal drip and sinus pressure. Negative for ear pain and sore throat.   Eyes: Negative.   Respiratory: Negative.    Cardiovascular: Negative.   Neurological:  Positive for dizziness and headaches.       Objective:   Physical Exam Constitutional:      Comments: Unsteady on her feet. She walks with a cane and with holding onto her husband's arm  HENT:     Right Ear: Tympanic membrane, ear canal and external ear normal.     Left Ear: Tympanic membrane, ear canal and external ear normal.     Nose: Nose normal.     Mouth/Throat:     Pharynx: Oropharynx is clear.  Eyes:     Conjunctiva/sclera: Conjunctivae normal.     Pupils: Pupils are equal, round, and reactive to light.  Cardiovascular:     Rate and Rhythm: Normal rate and regular rhythm.     Pulses: Normal pulses.     Heart sounds: Normal heart sounds.  Pulmonary:     Effort: Pulmonary effort is normal.     Breath sounds: Normal breath sounds.  Lymphadenopathy:     Cervical: No cervical adenopathy.  Neurological:     Mental Status: She is alert and oriented to person, place, and time.           Assessment & Plan:  She has another bout of vertigo, and this was likely triggered by a viral URI. We will supply her with Meclizine to use as needed. She will also try a Medrol dose pack.  Gershon Crane, MD

## 2023-05-17 ENCOUNTER — Encounter (INDEPENDENT_AMBULATORY_CARE_PROVIDER_SITE_OTHER): Payer: Self-pay

## 2023-05-18 DIAGNOSIS — M19011 Primary osteoarthritis, right shoulder: Secondary | ICD-10-CM | POA: Diagnosis not present

## 2023-06-02 NOTE — Progress Notes (Unsigned)
HPI: [name] is a 77 y.o. female, who is here today with her husband for chronic disease management.  Last seen on 04/18/23, when she was c/o persistent dysuria and vulvovaginal burning sensation. She had been treated with appropriate abx for possible UTI. On examination vaginal ulcer and erythema was appreciated. Recommend topica; triamcinolone-nystatin. She reports that her symptoms have improved but she is still experiencing some vaginal discomfort/pain. Negative for discharge, vaginal bleeding,or pelvic pain.  Today she is also c/o left hip pain. She has been having left anterior hip pain for a while, which has made it difficult for her to walk and causes pain when she stands up, She follows with orthopedist. Generalized OA, affecting mainly lower back and right shoulder.  She also reports right outer upper breast pain that started last week, which she describes as different from the pain she experienced after her breast lumpectomy in 04/2020. She has noticed a small, "sore bump" in the area.   Negative for nipple discharge or skin changes. Last mammogram was diagnostic and on 02/09/22, Bi-Rads 2.  She mentions that she still experiences some "vertigo", although it has improved. She describes the sensation as not spinning, but feeling off balance. Denies associated headache,hearing changes,or tinnitus. It is intermittent, it does not happen in bed. She has not identified exacerbating or alleviating factors.  She was evaluated on 05/10/23 for respiratory symptoms and episodes of dizziness. She is on Meclizine 25 mg.  Head CT on 01/16/22: No acute intracranial abnormality.  Brain MRI 11/2021: No acute or reversible finding. Mild chronic small-vessel ischemic changes of the pons and cerebellum. Moderate chronic small-vessel ischemic changes of the cerebral hemispheric white matter. Old lacunar infarction left caudate body.  She has a history of falls,last one a week ago, no  serious harm, did not seek medical attention. She has tried physical therapy in the past for her balance issues, which helped.  She feels more stable with a cane, has a walker at home but does not use it frequently.  She has taken Advil for her pain, last dose within the last week. She has taken Tramadol for back pain and has found it helpful. Bilateral lower back pain is severe, radiated to hips sometimes. Exacerbated by movement,walking,and standing. It interferes with daily activities. No associated LE numbness,bowel/bladder function changes, or saddle anesthesia.  Lumbar X ray 04/25/23: 1. No fracture or acute finding. 2. Degenerative changes as detailed, unchanged compared to the prior radiographs.  CKD III: Her e GFR has improved but not yet back to her baseline, 30's-40's.  Lab Results  Component Value Date   NA 139 04/21/2023   CL 106 04/21/2023   K 4.8 04/21/2023   CO2 24 04/21/2023   BUN 47 (H) 04/21/2023   CREATININE 2.13 (H) 04/21/2023   GFR 22.32 (L) 04/21/2023   CALCIUM 9.4 04/21/2023   ALBUMIN 3.0 (L) 10/11/2022   GLUCOSE 139 (H) 04/21/2023   Former smoker. She had a chest CTA on 06/14/23. Negative for cough,SOB,or wheezing.  Review of Systems  Constitutional:  Positive for fatigue. Negative for activity change, appetite change, chills and fever.  HENT:  Negative for mouth sores, sore throat and trouble swallowing.   Gastrointestinal:  Negative for abdominal pain, nausea and vomiting.  Genitourinary:  Negative for decreased urine volume, dysuria, frequency and hematuria.  Musculoskeletal:  Positive for arthralgias, back pain, gait problem and neck pain.  Skin:  Negative for rash.  Neurological:  Negative for syncope and facial asymmetry.  Psychiatric/Behavioral:  Negative for confusion and hallucinations.   See other pertinent positives and negatives in HPI.  Current Outpatient Medications on File Prior to Visit  Medication Sig Dispense Refill   acetaminophen  (TYLENOL) 500 MG tablet Take 500-1,000 mg by mouth every 6 (six) hours as needed for headache.      albuterol (VENTOLIN HFA) 108 (90 Base) MCG/ACT inhaler Inhale 2 puffs into the lungs every 4 (four) hours as needed for wheezing. 1 each 3   allopurinol (ZYLOPRIM) 100 MG tablet TAKE 1 TABLET BY MOUTH EVERY DAY 90 tablet 2   amLODipine (NORVASC) 5 MG tablet TAKE 1 TABLET (5 MG TOTAL) BY MOUTH DAILY. 90 tablet 3   APPLE CIDER VINEGAR PO Take 2,400 mg by mouth daily.     ASPERCREME LIDOCAINE EX Apply 1 application topically daily as needed (pain).     aspirin EC 81 MG tablet Take 81 mg by mouth daily.     Cholecalciferol (VITAMIN D3) 50 MCG (2000 UT) TABS Take 4,000 Units by mouth daily.      clopidogrel (PLAVIX) 75 MG tablet TAKE 1 TABLET BY MOUTH EVERY DAY WITH BREAKFAST 90 tablet 2   Cyanocobalamin (VITAMIN B-12) 5000 MCG SUBL Place 5,000 mcg under the tongue daily.      diclofenac Sodium (VOLTAREN) 1 % GEL APPLY 2 GRAMS TOPICALLY 4 TIMES A DAY AS NEEDED 100 g 2   ezetimibe (ZETIA) 10 MG tablet TAKE ONE TABLET BY MOUTH DAILY 90 tablet 3   fluticasone (FLONASE) 50 MCG/ACT nasal spray SPRAY 2 SPRAYS INTO EACH NOSTRIL EVERY DAY 48 mL 1   furosemide (LASIX) 40 MG tablet Take 40 mg by mouth daily.     guaiFENesin (MUCINEX) 600 MG 12 hr tablet Take 600 mg by mouth 2 (two) times daily as needed (congestion).     isosorbide mononitrate (IMDUR) 30 MG 24 hr tablet TAKE 1 TABLET BY MOUTH EVERY DAY 90 tablet 3   losartan (COZAAR) 100 MG tablet TAKE 1 TABLET BY MOUTH EVERY DAY 90 tablet 3   meclizine (ANTIVERT) 25 MG tablet Take 1 tablet (25 mg total) by mouth every 6 (six) hours as needed for dizziness. 60 tablet 0   methylPREDNISolone (MEDROL DOSEPAK) 4 MG TBPK tablet As directed 21 tablet 0   metoprolol succinate (TOPROL-XL) 100 MG 24 hr tablet TAKE 1 TABLET BY MOUTH EVERY DAY WITH OR IMMEDIATELY FOLLOWING A MEAL 90 tablet 3   Misc Natural Products (TART CHERRY ADVANCED) CAPS Take 1,000 capsules by mouth  daily.      nitroGLYCERIN (NITROSTAT) 0.4 MG SL tablet PLACE 1 TABLET UNDER THE TONGUE EVERY 5 MINUTES AS NEEDED FOR CHEST PAIN 25 tablet 6   nystatin cream (MYCOSTATIN) Apply 1 Application topically 2 (two) times daily. 30 g 2   ondansetron (ZOFRAN-ODT) 4 MG disintegrating tablet 4mg  ODT q4 hours prn nausea/vomit 20 tablet 0   oxymetazoline (AFRIN) 0.05 % nasal spray Place 1 spray into both nostrils 2 (two) times daily as needed for congestion.     pantoprazole (PROTONIX) 40 MG tablet TAKE 1 TABLET BY MOUTH EVERY DAY 90 tablet 2   potassium chloride (KLOR-CON M) 10 MEQ tablet Take 1 tablet (10 mEq total) by mouth 2 (two) times daily. 180 tablet 3   pravastatin (PRAVACHOL) 40 MG tablet TAKE 1 TABLET BY MOUTH EVERY DAY IN THE EVENING 90 tablet 3   Simethicone (GAS-X PO) Take 2 tablets by mouth daily as needed (gas).     spironolactone (ALDACTONE) 25 MG tablet  Take 0.5 tablets (12.5 mg total) by mouth daily. 45 tablet 3   terconazole (TERAZOL 7) 0.4 % vaginal cream Place 1 applicator vaginally at bedtime. 45 g 0   traMADol (ULTRAM) 50 MG tablet Take 1 tablet (50 mg total) by mouth every 12 (twelve) hours as needed. 30 tablet 0   triamcinolone cream (KENALOG) 0.1 % APPLY TO AFFECTED AREA TWICE A DAY 30 g 1   Current Facility-Administered Medications on File Prior to Visit  Medication Dose Route Frequency Provider Last Rate Last Admin   0.9 %  sodium chloride infusion  500 mL Intravenous Once Hilarie Fredrickson, MD        Past Medical History:  Diagnosis Date   Allergic rhinitis    Allergy    Anxiety    Arthritis    Ascending aorta dilation (HCC) 09/13/2022   Echo 09/12/2022: EF 50-55, GR 1 DD, mildly reduced RVSF, normal PASP (RVSP 17.7), mild dilation of ascending aorta (38 mm), RAP 3   Breast cancer (HCC) 04/28/2020   rigtht breast   Bronchitis    CAD (coronary artery disease)    1/19 PCI/DES to pLCX for ISR, normal EF.    Cancer (HCC)    hx of precancerous cells in right breast     Cataract    Cervical dysplasia    unsure of procedure, possible "burning" in her late 83s   CHF (congestive heart failure) (HCC)    Echo 06/2019: EF 55-60, elevated LVEDP, normal RV SF, mild MAC, mild MR, trivial TR   Complication of anesthesia    COPD (chronic obstructive pulmonary disease) (HCC)    early    Depression    Dyspnea    with exertion    Family history of adverse reaction to anesthesia    daughter- problems wiht n/v   GERD (gastroesophageal reflux disease)    Gout    Headache(784.0)    Heart murmur    hx of years ago    Hyperlipidemia    Hypertension    Low back pain    Menopausal syndrome    Myocardial infarct (HCC) 2007   hx of   Overactive bladder    PONV (postoperative nausea and vomiting)    Sleep apnea    Allergies  Allergen Reactions   Erythromycin Rash    But isn't certain   Sulfamethoxazole Rash    Social History   Socioeconomic History   Marital status: Married    Spouse name: Not on file   Number of children: 2   Years of education: Not on file   Highest education level: GED or equivalent  Occupational History   Occupation: retired   Occupation: retired    Comment: Production designer, theatre/television/film  Tobacco Use   Smoking status: Former    Current packs/day: 0.00    Average packs/day: 1.3 packs/day for 52.0 years (65.0 ttl pk-yrs)    Types: Cigarettes    Start date: 1966    Quit date: 2018    Years since quitting: 6.6   Smokeless tobacco: Never   Tobacco comments:    completely quit May of 2018; period of years she did not smoke   Vaping Use   Vaping status: Never Used  Substance and Sexual Activity   Alcohol use: Yes    Comment: seldom   Drug use: No   Sexual activity: Not Currently  Other Topics Concern   Not on file  Social History Narrative   Not on file   Social Determinants of Health  Financial Resource Strain: Low Risk  (10/25/2022)   Overall Financial Resource Strain (CARDIA)    Difficulty of Paying Living Expenses: Not very hard   Food Insecurity: Food Insecurity Present (10/25/2022)   Hunger Vital Sign    Worried About Running Out of Food in the Last Year: Sometimes true    Ran Out of Food in the Last Year: Sometimes true  Transportation Needs: No Transportation Needs (10/25/2022)   PRAPARE - Administrator, Civil Service (Medical): No    Lack of Transportation (Non-Medical): No  Physical Activity: Unknown (10/25/2022)   Exercise Vital Sign    Days of Exercise per Week: 0 days    Minutes of Exercise per Session: Not on file  Recent Concern: Physical Activity - Inactive (10/25/2022)   Exercise Vital Sign    Days of Exercise per Week: 0 days    Minutes of Exercise per Session: 20 min  Stress: Stress Concern Present (10/25/2022)   [employer] of Occupational Health - Occupational Stress Questionnaire    Feeling of Stress : Rather much  Social Connections: Unknown (10/25/2022)   Social Connection and Isolation Panel [NHANES]    Frequency of Communication with Friends and Family: Three times a week    Frequency of Social Gatherings with Friends and Family: Patient declined    Attends Religious Services: Patient declined    Active Member of Clubs or Organizations: No    Attends Engineer, structural: Not on file    Marital Status: Married   Vitals:   06/06/23 1405  BP: 110/78  Pulse: 82  Resp: 16  Temp: 97.9 F (36.6 C)  SpO2: 93%   Body mass index is 35.69 kg/m.  Physical Exam Vitals and nursing note reviewed.  Constitutional:      General: She is not in acute distress.    Appearance: She is well-developed.  HENT:     Head: Normocephalic and atraumatic.     Mouth/Throat:     Mouth: Mucous membranes are moist.     Pharynx: Oropharynx is clear.  Eyes:     Conjunctiva/sclera: Conjunctivae normal.  Cardiovascular:     Rate and Rhythm: Normal rate and regular rhythm.     Heart sounds: No murmur heard.    Comments: DP pulses palpable. Pulmonary:     Effort: Pulmonary effort is  normal. No respiratory distress.     Breath sounds: Normal breath sounds.  Chest:     Comments: Breast examination done while sitting up, she was not able to lie down due to back pain. No masses appreciated, + tenderness with palpation of upper outer quadrant. Abdominal:     Palpations: Abdomen is soft. There is no mass.     Tenderness: There is no abdominal tenderness.  Genitourinary:    Comments: Deferred to gynecologist. Musculoskeletal:     Lumbar back: No tenderness or bony tenderness.     Right lower leg: 1+ Pitting Edema present.     Left lower leg: 1+ Pitting Edema present.  Lymphadenopathy:     Cervical: No cervical adenopathy.     Upper Body:     Right upper body: No axillary adenopathy.     Left upper body: No axillary adenopathy.  Skin:    General: Skin is warm.     Findings: No erythema or rash.  Neurological:     General: No focal deficit present.     Mental Status: She is alert and oriented to person, place, and time.  Comments: Unstable gait assisted with a cane.  Psychiatric:        Mood and Affect: Mood and affect normal.   ASSESSMENT AND PLAN:  Ms. Tracy "Tracy [name]" was seen today for medical management of chronic issues.  Diagnoses and all orders for this visit: Lab Results  Component Value Date   NA 140 06/06/2023   CL 110 06/06/2023   K 4.2 06/06/2023   CO2 24 06/06/2023   BUN 21 06/06/2023   CREATININE 1.30 (H) 06/06/2023   GFR 40.33 (L) 06/06/2023   CALCIUM 9.0 06/06/2023   ALBUMIN 3.0 (L) 10/11/2022   GLUCOSE 123 (H) 06/06/2023    Chronic low back pain without sciatica, unspecified back pain laterality Assessment & Plan: No many options for pain management. Tramadol helped, so we decided to continue. PT will be arranged. Side effects discussed. PDMP reviewed. Will sign med contract next visit.  Orders: -     Ambulatory referral to Physical Therapy -     traMADol HCl; Take 0.5 tablets (25 mg total) by mouth every 12 (twelve) hours  as needed.  Dispense: 30 tablet; Refill: 1  Stage 3a chronic kidney disease (HCC) Assessment & Plan: Cr and e GFR have fluctuated in the past year, most e GFR high 30's and low 40's and Cr 1.3-1.8. We discussed Dx and prognosis. Stressed the importance of adequate hydration, low salt diet,and avoidance of NSAID's. She was referred to nephrologist in 04/2023.  Orders: -     Microalbumin / creatinine urine ratio -     Basic metabolic panel  Frequent falls Assessment & Plan: Another fall a week ago, no serious injury. Fall precautions discussed. Last PT a year ago, she felt like it helped, new referral placed.  Orders: -     Ambulatory referral to Physical Therapy  Genital ulcer, female Reports improvement but not resolution of symptoms. May need bx to evaluate for more concerning process. Gyn referral placed.  -     Ambulatory referral to Gynecology  Dizziness Assessment & Plan: This is a chronic problem, she is reporting improvement. We discussed possible etiologies. Keep appointment with cardiologist. If problem is persistent, we need to consider neurology reevaluation. Yesterday importance of adequate hydration. Instructed about warning signs.  Breast tenderness in female -     MM 3D DIAGNOSTIC MAMMOGRAM BILATERAL BREAST; Future  Screening for lung cancer -     Ambulatory Referral for Lung Cancer Scre  I spent a total of 48 minutes in both face to face and non face to face activities for this visit on the date of this encounter. During this time history was obtained and documented, examination was performed, prior labs/imaging reviewed, and assessment/plan discussed.  Return in about 2 months (around 08/06/2023) for chronic problems.  Ranie Chinchilla G. [place], MD  [facility]. [place] office.

## 2023-06-04 ENCOUNTER — Other Ambulatory Visit: Payer: Self-pay | Admitting: Family Medicine

## 2023-06-04 DIAGNOSIS — M109 Gout, unspecified: Secondary | ICD-10-CM

## 2023-06-06 ENCOUNTER — Ambulatory Visit (INDEPENDENT_AMBULATORY_CARE_PROVIDER_SITE_OTHER): Payer: Medicare Other | Admitting: Family Medicine

## 2023-06-06 ENCOUNTER — Encounter: Payer: Self-pay | Admitting: Family Medicine

## 2023-06-06 VITALS — BP 110/78 | HR 82 | Temp 97.9°F | Resp 16 | Ht 65.0 in | Wt 214.5 lb

## 2023-06-06 DIAGNOSIS — Z122 Encounter for screening for malignant neoplasm of respiratory organs: Secondary | ICD-10-CM

## 2023-06-06 DIAGNOSIS — N644 Mastodynia: Secondary | ICD-10-CM | POA: Diagnosis not present

## 2023-06-06 DIAGNOSIS — R296 Repeated falls: Secondary | ICD-10-CM

## 2023-06-06 DIAGNOSIS — R42 Dizziness and giddiness: Secondary | ICD-10-CM | POA: Diagnosis not present

## 2023-06-06 DIAGNOSIS — N1831 Chronic kidney disease, stage 3a: Secondary | ICD-10-CM | POA: Diagnosis not present

## 2023-06-06 DIAGNOSIS — M545 Low back pain, unspecified: Secondary | ICD-10-CM

## 2023-06-06 DIAGNOSIS — G8929 Other chronic pain: Secondary | ICD-10-CM

## 2023-06-06 DIAGNOSIS — N766 Ulceration of vulva: Secondary | ICD-10-CM

## 2023-06-06 LAB — BASIC METABOLIC PANEL
BUN: 21 mg/dL (ref 6–23)
CO2: 24 mEq/L (ref 19–32)
Calcium: 9 mg/dL (ref 8.4–10.5)
Chloride: 110 mEq/L (ref 96–112)
Creatinine, Ser: 1.3 mg/dL — ABNORMAL HIGH (ref 0.40–1.20)
GFR: 40.33 mL/min — ABNORMAL LOW (ref >60.00)
Glucose, Bld: 123 mg/dL — ABNORMAL HIGH (ref 70–99)
Potassium: 4.2 mEq/L (ref 3.5–5.1)
Sodium: 140 mEq/L (ref 135–145)

## 2023-06-06 MED ORDER — TRAMADOL HCL 50 MG PO TABS: 25.0000 mg | ORAL_TABLET | Freq: Two times a day (BID) | ORAL | 1 refills | Status: DC | PRN

## 2023-06-06 NOTE — Assessment & Plan Note (Signed)
No many options for pain management. Tramadol helped, so we decided to continue. Side effects discussed. Will sign med contract next visit.

## 2023-06-06 NOTE — Assessment & Plan Note (Signed)
Another fall a week ago, no serious injury. Fall precautions discussed. Last PT a year ago, she felt like it helped, new referral placed.

## 2023-06-06 NOTE — Assessment & Plan Note (Signed)
Stressed the importance of adequate hydration, low salt diet,and avoidance of NSAID's.

## 2023-06-06 NOTE — Patient Instructions (Addendum)
A few things to remember from today's visit:  Screening for lung cancer - Plan: Ambulatory Referral for Lung Cancer Scre  Stage 3a chronic kidney disease (HCC) - Plan: Microalbumin/Creatinine Ratio, Urine, Basic Metabolic Panel  Frequent falls - Plan: Ambulatory referral to Physical Therapy  Chronic low back pain without sciatica, unspecified back pain laterality - Plan: Ambulatory referral to Physical Therapy, traMADol (ULTRAM) 50 MG tablet  Genital ulcer, female - Plan: Ambulatory referral to Gynecology  Dizziness  Breast tenderness in female - Plan: MM 3D DIAGNOSTIC MAMMOGRAM BILATERAL BREAST  Continue Tramadol 1/2 tab 2 times daily with Tylenol as needed. Continue fall precautions. Miralax daily to prevent constipation. PT will be arranged.  If you need refills for medications you take chronically, please call your pharmacy. Do not use My Chart to request refills or for acute issues that need immediate attention. If you send a my chart message, it may take a few days to be addressed, specially if I am not in the office.  Please be sure medication list is accurate. If a new problem present, please set up appointment sooner than planned today.

## 2023-06-06 NOTE — Assessment & Plan Note (Signed)
This is a chronic problem, she is reporting improvement. We discussed possible etiologies. Keep appointment with cardiologist. If problem is persistent, we need to consider neurology reevaluation. Yesterday importance of adequate hydration. Instructed about warning signs.

## 2023-06-13 ENCOUNTER — Encounter: Payer: Self-pay | Admitting: Cardiovascular Disease

## 2023-06-13 ENCOUNTER — Ambulatory Visit: Payer: Medicare Other | Attending: Cardiovascular Disease | Admitting: Cardiovascular Disease

## 2023-06-13 VITALS — BP 130/90 | HR 74 | Ht 65.0 in | Wt 213.4 lb

## 2023-06-13 DIAGNOSIS — I25119 Atherosclerotic heart disease of native coronary artery with unspecified angina pectoris: Secondary | ICD-10-CM | POA: Diagnosis not present

## 2023-06-13 DIAGNOSIS — E782 Mixed hyperlipidemia: Secondary | ICD-10-CM | POA: Diagnosis not present

## 2023-06-13 DIAGNOSIS — I5032 Chronic diastolic (congestive) heart failure: Secondary | ICD-10-CM | POA: Diagnosis not present

## 2023-06-13 DIAGNOSIS — I10 Essential (primary) hypertension: Secondary | ICD-10-CM | POA: Diagnosis not present

## 2023-06-13 MED ORDER — EZETIMIBE 10 MG PO TABS: 10.0000 mg | ORAL_TABLET | Freq: Every day | ORAL | 3 refills | Status: DC

## 2023-06-13 NOTE — Patient Instructions (Signed)
Medication Instructions:  Your physician recommends that you continue on your current medications as directed. Please refer to the Current Medication list given to you today.  *If you need a refill on your cardiac medications before your next appointment, please call your pharmacy*   Lab Work: NONE If you have labs (blood work) drawn today and your tests are completely normal, you will receive your results only by: MyChart Message (if you have MyChart) OR A paper copy in the mail If you have any lab test that is abnormal or we need to change your treatment, we will call you to review the results.   Testing/Procedures: NONE   Follow-Up: At [facility], you and your health needs are our priority.  As part of our continuing mission to provide you with exceptional heart care, we have created designated Provider Care Teams.  These Care Teams include your primary Cardiologist (physician) and Advanced Practice Providers (APPs -  Physician Assistants and Nurse Practitioners) who all work together to provide you with the care you need, when you need it.  We recommend signing up for the patient portal called "MyChart".  Sign up information is provided on this After Visit Summary.  MyChart is used to connect with patients for Virtual Visits (Telemedicine).  Patients are able to view lab/test results, encounter notes, upcoming appointments, etc.  Non-urgent messages can be sent to your provider as well.   To learn more about what you can do with MyChart, go to [email or web address].    Your next appointment:   1 year(s)  Provider:   Michael Cooper, MD      

## 2023-06-13 NOTE — Progress Notes (Signed)
Cardiology Office Note:    Date:  06/13/2023   ID:  Tracy [name], DOB May 16, 1946, MRN [id number]  PCP:  [place], [name], MD   [facility] Providers Cardiologist:  Tonny Bollman, MD Cardiology APP:  Kennon Rounds     Referring MD: [place], [name], MD   Chief Complaint  Patient presents with   Dizziness    History of Present Illness:    Tracy [name] is a 77 y.o. female with a hx of:  Coronary artery disease  S/p BMS to LCx in 2007 S/p DES to LCx in 10/2017 Myoview 07/2019: ant ischemia; high risk Cath 07/2019: patent LCx stent, mod diff dz in LAD and mild dz in RCA - med Rx  Heart failure with preserved ejection fraction  Echocardiogram 12/18: EF 50-55, Gr 1 DD Chronic kidney disease 3 COPD Hypertension  Hyperlipidemia  Elevated LFTs  Aortic atherosclerosis (CT 11/2020) S/p BSO, mini-lap for drainage of contained cyst (10/2020)  The patient is here with her husband Tracy [name] today.  He is also a patient of mine.  She reports problems with balance, gait unsteadiness, and dizziness.  These are fairly longstanding issues.  I reviewed my note from March 2023 when she was under evaluation for the same complaints.  She feels like her symptoms have worsened.  She has had a brain MRI and full neurologic evaluation.  She is also seeing ENT in the past.  She does not have postural symptoms of near syncope or frank syncope.  She has occasional chest discomfort that is unchanged over time and a stable pattern.  Also reports exertional dyspnea.  No orthopnea or PND.  She has had left leg cellulitis and continues to have some residual swelling and redness there.  Past Medical History:  Diagnosis Date   Allergic rhinitis    Allergy    Anxiety    Arthritis    Ascending aorta dilation (HCC) 09/13/2022   Echo 09/12/2022: EF 50-55, GR 1 DD, mildly reduced RVSF, normal PASP (RVSP 17.7), mild dilation of ascending aorta (38 mm), RAP 3   Breast cancer (HCC) 04/28/2020    rigtht breast   Bronchitis    CAD (coronary artery disease)    1/19 PCI/DES to pLCX for ISR, normal EF.    Cancer (HCC)    hx of precancerous cells in right breast    Cataract    Cervical dysplasia    unsure of procedure, possible "burning" in her late 55s   CHF (congestive heart failure) (HCC)    Echo 06/2019: EF 55-60, elevated LVEDP, normal RV SF, mild MAC, mild MR, trivial TR   Complication of anesthesia    COPD (chronic obstructive pulmonary disease) (HCC)    early    Depression    Dyspnea    with exertion    Family history of adverse reaction to anesthesia    daughter- problems wiht n/v   GERD (gastroesophageal reflux disease)    Gout    Headache(784.0)    Heart murmur    hx of years ago    Hyperlipidemia    Hypertension    Low back pain    Menopausal syndrome    Myocardial infarct (HCC) 2007   hx of   Overactive bladder    PONV (postoperative nausea and vomiting)    Sleep apnea     Past Surgical History:  Procedure Laterality Date   ANGIOPLASTY     stent 2007   BREAST LUMPECTOMY WITH RADIOACTIVE SEED  LOCALIZATION Right 04/28/2020   Procedure: RIGHT BREAST LUMPECTOMY X 2  WITH RADIOACTIVE SEED LOCALIZATION;  Surgeon: Manus Rudd, MD;  Location: [facility];  Service: General;  Laterality: Right;  LMA   CARDIAC CATHETERIZATION     COLONOSCOPY     CORONARY STENT INTERVENTION N/A 11/06/2017   Procedure: CORONARY STENT INTERVENTION;  Surgeon: Yvonne Kendall, MD;  Location: [facility];  Service: Cardiovascular;  Laterality: N/A;   LEFT HEART CATH AND CORONARY ANGIOGRAPHY N/A 11/06/2017   Procedure: LEFT HEART CATH AND CORONARY ANGIOGRAPHY;  Surgeon: Yvonne Kendall, MD;  Location: [facility];  Service: Cardiovascular;  Laterality: N/A;   LEFT HEART CATH AND CORONARY ANGIOGRAPHY N/A 07/24/2019   Procedure: LEFT HEART CATH AND CORONARY ANGIOGRAPHY;  Surgeon: Kathleene Hazel, MD;  Location: [facility];  Service:  Cardiovascular;  Laterality: N/A;   RIGHT/LEFT HEART CATH AND CORONARY ANGIOGRAPHY N/A 09/29/2020   Procedure: RIGHT/LEFT HEART CATH AND CORONARY ANGIOGRAPHY;  Surgeon: Lyn Records, MD;  Location: [facility];  Service: Cardiovascular;  Laterality: N/A;   ROBOTIC ASSISTED BILATERAL SALPINGO OOPHERECTOMY Bilateral 11/10/2020   Procedure: XI ROBOTIC ASSISTED BILATERAL SALPINGO OOPHORECTOMY WITH MINI LAPAROTOMY FOR DRAINAGE;  Surgeon: Carver Fila, MD;  Location: [facility];  Service: Gynecology;  Laterality: Bilateral;  MINI LAP FIRST   TONSILLECTOMY     VULVECTOMY N/A 11/10/2020   Procedure: WIDE EXCISION VULVECTOMY;  Surgeon: Carver Fila, MD;  Location: [facility];  Service: Gynecology;  Laterality: N/A;    Current Medications: Current Meds  Medication Sig   acetaminophen (TYLENOL) 500 MG tablet Take 500-1,000 mg by mouth every 6 (six) hours as needed for headache.    allopurinol (ZYLOPRIM) 100 MG tablet TAKE 1 TABLET BY MOUTH EVERY DAY   amLODipine (NORVASC) 5 MG tablet TAKE 1 TABLET (5 MG TOTAL) BY MOUTH DAILY.   APPLE CIDER VINEGAR PO Take 2,400 mg by mouth daily.   ASPERCREME LIDOCAINE EX Apply 1 application topically daily as needed (pain).   aspirin EC 81 MG tablet Take 81 mg by mouth daily.   Cholecalciferol (VITAMIN D3) 50 MCG (2000 UT) TABS Take 4,000 Units by mouth daily.    clopidogrel (PLAVIX) 75 MG tablet TAKE 1 TABLET BY MOUTH EVERY DAY WITH BREAKFAST   Cyanocobalamin (VITAMIN B-12) 5000 MCG SUBL Place 5,000 mcg under the tongue daily.    fluticasone (FLONASE) 50 MCG/ACT nasal spray SPRAY 2 SPRAYS INTO EACH NOSTRIL EVERY DAY   furosemide (LASIX) 40 MG tablet Take 40 mg by mouth daily.   guaiFENesin (MUCINEX) 600 MG 12 hr tablet Take 600 mg by mouth 2 (two) times daily as needed (congestion).   isosorbide mononitrate (IMDUR) 30 MG 24 hr tablet TAKE 1 TABLET BY MOUTH EVERY DAY   losartan (COZAAR) 100 MG tablet TAKE 1 TABLET BY MOUTH EVERY DAY   meclizine  (ANTIVERT) 25 MG tablet Take 1 tablet (25 mg total) by mouth every 6 (six) hours as needed for dizziness.   metoprolol succinate (TOPROL-XL) 100 MG 24 hr tablet TAKE 1 TABLET BY MOUTH EVERY DAY WITH OR IMMEDIATELY FOLLOWING A MEAL   Misc Natural Products (TART CHERRY ADVANCED) CAPS Take 1,000 capsules by mouth daily.    nitroGLYCERIN (NITROSTAT) 0.4 MG SL tablet PLACE 1 TABLET UNDER THE TONGUE EVERY 5 MINUTES AS NEEDED FOR CHEST PAIN   nystatin cream (MYCOSTATIN) Apply 1 Application topically 2 (two) times daily.   pantoprazole (PROTONIX) 40 MG tablet TAKE 1 TABLET BY  MOUTH EVERY DAY   potassium chloride (KLOR-CON M) 10 MEQ tablet Take 1 tablet (10 mEq total) by mouth 2 (two) times daily. (Patient taking differently: Take 10 mEq by mouth daily.)   pravastatin (PRAVACHOL) 40 MG tablet TAKE 1 TABLET BY MOUTH EVERY DAY IN THE EVENING   Simethicone (GAS-X PO) Take 2 tablets by mouth daily as needed (gas).   terconazole (TERAZOL 7) 0.4 % vaginal cream Place 1 applicator vaginally at bedtime.   traMADol (ULTRAM) 50 MG tablet Take 0.5 tablets (25 mg total) by mouth every 12 (twelve) hours as needed.   triamcinolone cream (KENALOG) 0.1 % APPLY TO AFFECTED AREA TWICE A DAY   [DISCONTINUED] albuterol (VENTOLIN HFA) 108 (90 Base) MCG/ACT inhaler Inhale 2 puffs into the lungs every 4 (four) hours as needed for wheezing.   [DISCONTINUED] diclofenac Sodium (VOLTAREN) 1 % GEL APPLY 2 GRAMS TOPICALLY 4 TIMES A DAY AS NEEDED   [DISCONTINUED] ezetimibe (ZETIA) 10 MG tablet TAKE ONE TABLET BY MOUTH DAILY   [DISCONTINUED] methylPREDNISolone (MEDROL DOSEPAK) 4 MG TBPK tablet As directed   [DISCONTINUED] ondansetron (ZOFRAN-ODT) 4 MG disintegrating tablet 4mg  ODT q4 hours prn nausea/vomit   [DISCONTINUED] oxymetazoline (AFRIN) 0.05 % nasal spray Place 1 spray into both nostrils 2 (two) times daily as needed for congestion.   [DISCONTINUED] spironolactone (ALDACTONE) 25 MG tablet Take 0.5 tablets (12.5 mg total) by mouth  daily.   Current Facility-Administered Medications for the 06/13/23 encounter (Office Visit) with Tonny Bollman, MD  Medication   0.9 %  sodium chloride infusion     Allergies:   Erythromycin and Sulfamethoxazole   Social History   Socioeconomic History   Marital status: Married    Spouse name: Not on file   Number of children: 2   Years of education: Not on file   Highest education level: GED or equivalent  Occupational History   Occupation: retired   Occupation: retired    Comment: Production designer, theatre/television/film  Tobacco Use   Smoking status: Former    Current packs/day: 0.00    Average packs/day: 1.3 packs/day for 52.0 years (65.0 ttl pk-yrs)    Types: Cigarettes    Start date: 1966    Quit date: 2018    Years since quitting: 6.6   Smokeless tobacco: Never   Tobacco comments:    completely quit May of 2018; period of years she did not smoke   Vaping Use   Vaping status: Never Used  Substance and Sexual Activity   Alcohol use: Yes    Comment: seldom   Drug use: No   Sexual activity: Not Currently  Other Topics Concern   Not on file  Social History Narrative   Not on file   Social Determinants of Health   Financial Resource Strain: Low Risk  (10/25/2022)   Overall Financial Resource Strain (CARDIA)    Difficulty of Paying Living Expenses: Not very hard  Food Insecurity: Food Insecurity Present (10/25/2022)   Hunger Vital Sign    Worried About Running Out of Food in the Last Year: Sometimes true    Ran Out of Food in the Last Year: Sometimes true  Transportation Needs: No Transportation Needs (10/25/2022)   PRAPARE - Administrator, Civil Service (Medical): No    Lack of Transportation (Non-Medical): No  Physical Activity: Unknown (10/25/2022)   Exercise Vital Sign    Days of Exercise per Week: 0 days    Minutes of Exercise per Session: Not on file  Recent Concern: Physical Activity -  Inactive (10/25/2022)   Exercise Vital Sign    Days of Exercise per Week: 0 days     Minutes of Exercise per Session: 20 min  Stress: Stress Concern Present (10/25/2022)   [employer] of Occupational Health - Occupational Stress Questionnaire    Feeling of Stress : Rather much  Social Connections: Unknown (10/25/2022)   Social Connection and Isolation Panel [NHANES]    Frequency of Communication with Friends and Family: Three times a week    Frequency of Social Gatherings with Friends and Family: Patient declined    Attends Religious Services: Patient declined    Database administrator or Organizations: No    Attends Engineer, structural: Not on file    Marital Status: Married     Family History: The patient's family history includes Alzheimer's disease in her maternal uncle; Cancer in her mother; Colon cancer (age of onset: 43) in her mother; Depression in her mother; Diabetes in her daughter; Other in an other family member. There is no history of Stomach cancer. She was adopted.  ROS:   Please see the history of present illness.    All other systems reviewed and are negative.  EKGs/Labs/Other Studies Reviewed:    The following studies were reviewed today: Echo 08/2022:  1. Left ventricular ejection fraction, by estimation, is 50 to 55%. The  left ventricle has low normal function. Left ventricular endocardial  border not optimally defined to evaluate regional wall motion. Left  ventricular diastolic parameters are  consistent with Grade I diastolic dysfunction (impaired relaxation).   2. Right ventricular systolic function is mildly reduced. The right  ventricular size is normal. There is normal pulmonary artery systolic  pressure. The estimated right ventricular systolic pressure is 17.7 mmHg.   3. The mitral valve is normal in structure. No evidence of mitral valve  regurgitation. No evidence of mitral stenosis.   4. The aortic valve is normal in structure. Aortic valve regurgitation is  not visualized. No aortic stenosis is present.   5. There is  mild dilatation of the ascending aorta, measuring 38 mm.   6. The inferior vena cava is normal in size with greater than 50%  respiratory variability, suggesting right atrial pressure of 3 mmHg.   7. EF may be underestimated in setting of poor acoustical windows.  Recommend repeat limited study with definity contrast.       Recent Labs: 08/24/2022: NT-Pro BNP 241 10/11/2022: ALT 25 04/18/2023: Hemoglobin 11.9; Platelets 233.0 06/06/2023: BUN 21; Creatinine, Ser 1.30; Potassium 4.2; Sodium 140  Recent Lipid Panel    Component Value Date/Time   CHOL 193 08/31/2022 0931   TRIG 218 (H) 08/31/2022 0931   HDL 42 08/31/2022 0931   CHOLHDL 4.6 (H) 08/31/2022 0931   CHOLHDL 5.0 (H) 09/13/2016 0738   VLDL 35 (H) 09/13/2016 0738   LDLCALC 113 (H) 08/31/2022 0931   LDLDIRECT 111.0 01/05/2015 1019     Risk Assessment/Calculations:           Physical Exam:    VS:  BP (!) 130/90   Pulse 74   Ht 5\' 5"  (1.651 m)   Wt 213 lb 6.4 oz (96.8 kg)   SpO2 96%   BMI 35.51 kg/m     Wt Readings from Last 3 Encounters:  06/13/23 213 lb 6.4 oz (96.8 kg)  06/06/23 214 lb 8 oz (97.3 kg)  05/10/23 220 lb (99.8 kg)     GEN:  Well nourished, well developed in no acute distress  HEENT: Normal NECK: No JVD; No carotid bruits LYMPHATICS: No lymphadenopathy CARDIAC: RRR, no murmurs, rubs, gallops RESPIRATORY:  Clear to auscultation without rales, wheezing or rhonchi  ABDOMEN: Soft, non-tender, non-distended MUSCULOSKELETAL: 1+ left pretibial and ankle edema; No deformity  SKIN: Warm and dry NEUROLOGIC:  Alert and oriented x 3 PSYCHIATRIC:  Normal affect   ASSESSMENT:    1. Coronary artery disease involving native coronary artery of native heart with angina pectoris (HCC)   2. Chronic heart failure with preserved ejection fraction (HCC)   3. Essential (primary) hypertension   4. Mixed hyperlipidemia    PLAN:    In order of problems listed above:  The patient has stable symptoms of mild CCS  class II angina.  She continues on aspirin and clopidogrel for antiplatelet therapy, Zetia and pravastatin for lipid lowering, and her antianginal medications include isosorbide, amlodipine, and metoprolol succinate.  No changes are made today. The patient appears clinically stable with no overt symptoms of heart failure, NYHA functional class II limitation of fatigue and exertional dyspnea.  Continue current management.  LVEF 50 to 55% on most recent echo study. Blood pressure is controlled on multidrug therapy with metoprolol succinate, losartan, amlodipine, and isosorbide. Lipids are above goal, but the patient is taking what she can tolerate which is a combination of pravastatin and Zetia.  She has been referred to a lipid specialist and did not want to escalate her therapy.  She has had multiple discussions regarding this.  Will continue her current management.  Overall the patient appears clinically stable from a cardiac perspective.  Unfortunately her functional limitation is worsening and this seems to be related to balance issues.  She is followed by primary care and neurology.     Medication Adjustments/Labs and Tests Ordered: Current medicines are reviewed at length with the patient today.  Concerns regarding medicines are outlined above.  No orders of the defined types were placed in this encounter.  Meds ordered this encounter  Medications   ezetimibe (ZETIA) 10 MG tablet    Sig: Take 1 tablet (10 mg total) by mouth daily.    Dispense:  90 tablet    Refill:  3    Patient Instructions  Medication Instructions:  Your physician recommends that you continue on your current medications as directed. Please refer to the Current Medication list given to you today. *If you need a refill on your cardiac medications before your next appointment, please call your pharmacy*   Lab Work: NONE If you have labs (blood work) drawn today and your tests are completely normal, you will receive  your results only by: MyChart Message (if you have MyChart) OR A paper copy in the mail If you have any lab test that is abnormal or we need to change your treatment, we will call you to review the results.   Testing/Procedures: NONE   Follow-Up: At [facility], you and your health needs are our priority.  As part of our continuing mission to provide you with exceptional heart care, we have created designated Provider Care Teams.  These Care Teams include your primary Cardiologist (physician) and Advanced Practice Providers (APPs -  Physician Assistants and Nurse Practitioners) who all work together to provide you with the care you need, when you need it.  We recommend signing up for the patient portal called "MyChart".  Sign up information is provided on this After Visit Summary.  MyChart is used to connect with patients for Virtual Visits (Telemedicine).  Patients  are able to view lab/test results, encounter notes, upcoming appointments, etc.  Non-urgent messages can be sent to your provider as well.   To learn more about what you can do with MyChart, go to [email or web address].    Your next appointment:   1 year(s)  Provider:   Tonny Bollman, MD        Signed, Tonny Bollman, MD  06/13/2023 4:56 PM    [facility]

## 2023-06-15 ENCOUNTER — Other Ambulatory Visit: Payer: Self-pay | Admitting: Family Medicine

## 2023-06-15 DIAGNOSIS — N644 Mastodynia: Secondary | ICD-10-CM

## 2023-06-26 NOTE — Therapy (Unsigned)
OUTPATIENT PHYSICAL THERAPY THORACOLUMBAR EVALUATION   Patient Name: Tracy [name] MRN: [id number] DOB:May 10, 1946, 77 y.o., female Today's Date: 06/26/2023  END OF SESSION:   Past Medical History:  Diagnosis Date   Allergic rhinitis    Allergy    Anxiety    Arthritis    Ascending aorta dilation (HCC) 09/13/2022   Echo 09/12/2022: EF 50-55, GR 1 DD, mildly reduced RVSF, normal PASP (RVSP 17.7), mild dilation of ascending aorta (38 mm), RAP 3   Breast cancer (HCC) 04/28/2020   rigtht breast   Bronchitis    CAD (coronary artery disease)    1/19 PCI/DES to pLCX for ISR, normal EF.    Cancer (HCC)    hx of precancerous cells in right breast    Cataract    Cervical dysplasia    unsure of procedure, possible "burning" in her late 65s   CHF (congestive heart failure) (HCC)    Echo 06/2019: EF 55-60, elevated LVEDP, normal RV SF, mild MAC, mild MR, trivial TR   Complication of anesthesia    COPD (chronic obstructive pulmonary disease) (HCC)    early    Depression    Dyspnea    with exertion    Family history of adverse reaction to anesthesia    daughter- problems wiht n/v   GERD (gastroesophageal reflux disease)    Gout    Headache(784.0)    Heart murmur    hx of years ago    Hyperlipidemia    Hypertension    Low back pain    Menopausal syndrome    Myocardial infarct (HCC) 2007   hx of   Overactive bladder    PONV (postoperative nausea and vomiting)    Sleep apnea    Past Surgical History:  Procedure Laterality Date   ANGIOPLASTY     stent 2007   BREAST LUMPECTOMY WITH RADIOACTIVE SEED LOCALIZATION Right 04/28/2020   Procedure: RIGHT BREAST LUMPECTOMY X 2  WITH RADIOACTIVE SEED LOCALIZATION;  Surgeon: Manus Rudd, MD;  Location: [facility];  Service: General;  Laterality: Right;  LMA   CARDIAC CATHETERIZATION     COLONOSCOPY     CORONARY STENT INTERVENTION N/A 11/06/2017   Procedure: CORONARY STENT INTERVENTION;  Surgeon: Yvonne Kendall, MD;   Location: [facility];  Service: Cardiovascular;  Laterality: N/A;   LEFT HEART CATH AND CORONARY ANGIOGRAPHY N/A 11/06/2017   Procedure: LEFT HEART CATH AND CORONARY ANGIOGRAPHY;  Surgeon: Yvonne Kendall, MD;  Location: [facility];  Service: Cardiovascular;  Laterality: N/A;   LEFT HEART CATH AND CORONARY ANGIOGRAPHY N/A 07/24/2019   Procedure: LEFT HEART CATH AND CORONARY ANGIOGRAPHY;  Surgeon: Kathleene Hazel, MD;  Location: [facility];  Service: Cardiovascular;  Laterality: N/A;   RIGHT/LEFT HEART CATH AND CORONARY ANGIOGRAPHY N/A 09/29/2020   Procedure: RIGHT/LEFT HEART CATH AND CORONARY ANGIOGRAPHY;  Surgeon: Lyn Records, MD;  Location: [facility];  Service: Cardiovascular;  Laterality: N/A;   ROBOTIC ASSISTED BILATERAL SALPINGO OOPHERECTOMY Bilateral 11/10/2020   Procedure: XI ROBOTIC ASSISTED BILATERAL SALPINGO OOPHORECTOMY WITH MINI LAPAROTOMY FOR DRAINAGE;  Surgeon: Carver Fila, MD;  Location: [facility];  Service: Gynecology;  Laterality: Bilateral;  MINI LAP FIRST   TONSILLECTOMY     VULVECTOMY N/A 11/10/2020   Procedure: WIDE EXCISION VULVECTOMY;  Surgeon: Carver Fila, MD;  Location: [facility];  Service: Gynecology;  Laterality: N/A;   Patient Active Problem List   Diagnosis Date Noted   Intertrigo 04/18/2023  Venous stasis dermatitis of left lower extremity 11/28/2022   Ascending aorta dilation (HCC) 09/13/2022   Dizziness 01/19/2022   Frequent falls 01/19/2022   Tremor of both hands 08/16/2021   Aortic atherosclerosis (HCC) 06/15/2021   Generalized osteoarthritis of multiple sites 12/25/2020   Chronic pain disorder 12/25/2020   Pelvic mass in female    Bilateral lower extremity edema 04/13/2020   Shortness of breath    Anxiety disorder 10/29/2018   CKD (chronic kidney disease), stage III (HCC) 06/27/2018   COPD (chronic obstructive pulmonary disease) (HCC) 05/25/2018   Obesity 03/20/2018   Dyspnea on exertion 11/06/2017    Abnormal stress test 11/06/2017   Chronic heart failure with preserved ejection fraction (HCC) 09/04/2017   Left ventricular dysfunction 07/31/2017   Cataract, nuclear sclerotic senile, bilateral 02/01/2017   Gouty arthritis of toe of left foot 08/09/2016   Hyperuricemia 08/09/2016   Osteoarthritis (arthritis due to wear and tear of joints) 01/14/2014   Atherosclerosis of native coronary artery of native heart with angina pectoris (HCC) 08/02/2011   Mitral regurgitation 08/02/2011   OVERACTIVE BLADDER 07/02/2010   ACUTE CYSTITIS 05/25/2010   Mixed hyperlipidemia 08/11/2009   Leg edema, left 07/22/2009   GERD 04/11/2008   Constipation 04/11/2008   Essential hypertension 05/30/2007   MYOCARDIAL INFARCTION, HX OF 05/30/2007   Allergic rhinitis 05/30/2007   LOW BACK PAIN 05/30/2007    PCP: [place], [name], MD  REFERRING PROVIDER: [place], [name], MD  REFERRING DIAG: R29.6 (ICD-10-CM) - Frequent falls M54.50,G89.29 (ICD-10-CM) - Chronic low back pain without sciatica, unspecified back pain laterality  Rationale for Evaluation and Treatment: Rehabilitation  THERAPY DIAG:  No diagnosis found.  ONSET DATE: chronic  SUBJECTIVE:                                                                                                                                                                                           SUBJECTIVE STATEMENT: ***  PERTINENT HISTORY:  Frequent falls Assessment & Plan: Another fall a week ago, no serious injury. Fall precautions discussed. Last PT a year ago, she felt like it helped, new referral placed.   Orders: -     Ambulatory referral to Physical Therapy Chronic low back pain without sciatica, unspecified back pain laterality Assessment & Plan: No many options for pain management. Tramadol helped, so we decided to continue. PT will be arranged. Side effects discussed. PDMP reviewed. Will sign med contract next visit.   Orders: -      Ambulatory referral to Physical Therapy -     traMADol HCl; Take 0.5 tablets (25 mg total) by mouth every 12 (twelve) hours  as needed.  Dispense: 30 tablet; Refill: 1 PAIN:  Are you having pain? {OPRCPAIN:27236}  PRECAUTIONS: Fall  RED FLAGS: None   WEIGHT BEARING RESTRICTIONS: No  FALLS:  Has patient fallen in last 6 months? Yes. Number of falls ***  LIVING ENVIRONMENT: Lives with: lives with their spouse Lives in: {Lives in:25570} Stairs: {opstairs:27293} Has following equipment at home: {Assistive devices:23999}  OCCUPATION: retired  PLOF: Independent  PATIENT GOALS: To reduce my fall risk and manage my back pain  NEXT MD VISIT: ***  OBJECTIVE:   DIAGNOSTIC FINDINGS:  none  PATIENT SURVEYS:  FOTO ***  SCREENING FOR RED FLAGS: none  MUSCLE LENGTH: Hamstrings: Right *** deg; Left *** deg Thomas test: Right *** deg; Left *** deg  POSTURE: {posture:25561}  PALPATION: ***  LUMBAR ROM:   AROM eval  Flexion   Extension   Right lateral flexion   Left lateral flexion   Right rotation   Left rotation    (Blank rows = not tested)  LOWER EXTREMITY ROM:     {AROM/PROM:27142}  Right eval Left eval  Hip flexion    Hip extension    Hip abduction    Hip adduction    Hip internal rotation    Hip external rotation    Knee flexion    Knee extension    Ankle dorsiflexion    Ankle plantarflexion    Ankle inversion    Ankle eversion     (Blank rows = not tested)  LOWER EXTREMITY MMT:    MMT Right eval Left eval  Hip flexion    Hip extension    Hip abduction    Hip adduction    Hip internal rotation    Hip external rotation    Knee flexion    Knee extension    Ankle dorsiflexion    Ankle plantarflexion    Ankle inversion    Ankle eversion     (Blank rows = not tested)  LUMBAR SPECIAL TESTS:  {lumbar special test:25242}  FUNCTIONAL TESTS:  {Functional tests:24029}  GAIT: Distance walked: *** Assistive device utilized: {Assistive  devices:23999} Level of assistance: {Levels of assistance:24026} Comments: ***  TODAY'S TREATMENT:                                                                                                                              DATE: ***    PATIENT EDUCATION:  Education details: Discussed eval findings, rehab rationale and POC and patient is in agreement  Person educated: Patient Education method: Explanation Education comprehension: verbalized understanding and needs further education  HOME EXERCISE PROGRAM: ***  ASSESSMENT:  CLINICAL IMPRESSION: Patient is a *** y.o. *** who was seen today for physical therapy evaluation and treatment for ***.   OBJECTIVE IMPAIRMENTS: Abnormal gait, decreased activity tolerance, decreased balance, decreased knowledge of condition, decreased mobility, difficulty walking, decreased strength, decreased safety awareness, postural dysfunction, and obesity.   ACTIVITY LIMITATIONS: carrying, lifting, standing, stairs, and locomotion level  PERSONAL FACTORS: Age, Past/current experiences, and Time since onset of injury/illness/exacerbation are also affecting patient's functional outcome.   REHAB POTENTIAL: Good  CLINICAL DECISION MAKING: Evolving/moderate complexity  EVALUATION COMPLEXITY: Moderate   GOALS: Goals reviewed with patient? No  SHORT TERM GOALS: Target date: ***  *** Baseline: Goal status: INITIAL  2.  *** Baseline:  Goal status: INITIAL  3.  *** Baseline:  Goal status: INITIAL  4.  *** Baseline:  Goal status: INITIAL  5.  *** Baseline:  Goal status: INITIAL  6.  *** Baseline:  Goal status: INITIAL  LONG TERM GOALS: Target date: ***  *** Baseline:  Goal status: INITIAL  2.  *** Baseline:  Goal status: INITIAL  3.  *** Baseline:  Goal status: INITIAL  4.  *** Baseline:  Goal status: INITIAL  5.  *** Baseline:  Goal status: INITIAL  6.  *** Baseline:  Goal status: INITIAL  PLAN:  PT  FREQUENCY: 1-2x/week  PT DURATION: 6 weeks  PLANNED INTERVENTIONS: Therapeutic exercises, Therapeutic activity, Neuromuscular re-education, Balance training, Gait training, Patient/Family education, Self Care, Joint mobilization, Stair training, DME instructions, Dry Needling, Electrical stimulation, Spinal mobilization, Cryotherapy, Moist heat, Manual therapy, and Re-evaluation.  PLAN FOR NEXT SESSION: HEP review and update, manual techniques as appropriate, aerobic tasks, ROM and flexibility activities, strengthening and PREs, TPDN, gait and balance training as needed     Hildred Laser, PT 06/26/2023, 1:27 PM

## 2023-06-27 ENCOUNTER — Other Ambulatory Visit: Payer: Self-pay

## 2023-06-27 ENCOUNTER — Ambulatory Visit: Payer: Medicare Other | Attending: Family Medicine

## 2023-06-27 DIAGNOSIS — M545 Low back pain, unspecified: Secondary | ICD-10-CM | POA: Diagnosis not present

## 2023-06-27 DIAGNOSIS — M5459 Other low back pain: Secondary | ICD-10-CM | POA: Insufficient documentation

## 2023-06-27 DIAGNOSIS — R2681 Unsteadiness on feet: Secondary | ICD-10-CM | POA: Insufficient documentation

## 2023-06-27 DIAGNOSIS — G8929 Other chronic pain: Secondary | ICD-10-CM | POA: Insufficient documentation

## 2023-06-27 DIAGNOSIS — M6281 Muscle weakness (generalized): Secondary | ICD-10-CM | POA: Diagnosis not present

## 2023-06-27 DIAGNOSIS — H8113 Benign paroxysmal vertigo, bilateral: Secondary | ICD-10-CM | POA: Insufficient documentation

## 2023-06-27 DIAGNOSIS — R296 Repeated falls: Secondary | ICD-10-CM | POA: Insufficient documentation

## 2023-06-30 ENCOUNTER — Telehealth: Payer: Self-pay | Admitting: Family Medicine

## 2023-06-30 DIAGNOSIS — R197 Diarrhea, unspecified: Secondary | ICD-10-CM

## 2023-06-30 NOTE — Telephone Encounter (Signed)
Pt is requesting something be called in for Diarrhea--she has had it for 3 days and has gassy feeling in stomach.  Pharmacy- [facility] on [address]

## 2023-06-30 NOTE — Telephone Encounter (Signed)
I spoke with PCP. PCP recommends Imodium & referral to gastro since this is ongoing. I spoke with patient, she is aware to try Imodium and take small bites of food and not overload her stomach. She is aware that referral to gastro has been placed as well.

## 2023-07-02 NOTE — Therapy (Unsigned)
OUTPATIENT PHYSICAL THERAPY TREATMENT NOTE   Patient Name: Tracy [name] MRN: [id number] DOB:05-12-1946, 77 y.o., female Today's Date: 07/04/2023  END OF SESSION:  PT End of Session - 07/04/23 1349     Visit Number 2    Number of Visits 12    Date for PT Re-Evaluation 08/27/23    Authorization Type MCR    PT Start Time 1350    PT Stop Time 1430    PT Time Calculation (min) 40 min    Activity Tolerance Patient tolerated treatment well    Behavior During Therapy WFL for tasks assessed/performed              Past Medical History:  Diagnosis Date   Allergic rhinitis    Allergy    Anxiety    Arthritis    Ascending aorta dilation (HCC) 09/13/2022   Echo 09/12/2022: EF 50-55, GR 1 DD, mildly reduced RVSF, normal PASP (RVSP 17.7), mild dilation of ascending aorta (38 mm), RAP 3   Breast cancer (HCC) 04/28/2020   rigtht breast   Bronchitis    CAD (coronary artery disease)    1/19 PCI/DES to pLCX for ISR, normal EF.    Cancer (HCC)    hx of precancerous cells in right breast    Cataract    Cervical dysplasia    unsure of procedure, possible "burning" in her late 87s   CHF (congestive heart failure) (HCC)    Echo 06/2019: EF 55-60, elevated LVEDP, normal RV SF, mild MAC, mild MR, trivial TR   Complication of anesthesia    COPD (chronic obstructive pulmonary disease) (HCC)    early    Depression    Dyspnea    with exertion    Family history of adverse reaction to anesthesia    daughter- problems wiht n/v   GERD (gastroesophageal reflux disease)    Gout    Headache(784.0)    Heart murmur    hx of years ago    Hyperlipidemia    Hypertension    Low back pain    Menopausal syndrome    Myocardial infarct (HCC) 2007   hx of   Overactive bladder    PONV (postoperative nausea and vomiting)    Sleep apnea    Past Surgical History:  Procedure Laterality Date   ANGIOPLASTY     stent 2007   BREAST LUMPECTOMY WITH RADIOACTIVE SEED LOCALIZATION Right 04/28/2020    Procedure: RIGHT BREAST LUMPECTOMY X 2  WITH RADIOACTIVE SEED LOCALIZATION;  Surgeon: Manus Rudd, MD;  Location: [facility];  Service: General;  Laterality: Right;  LMA   CARDIAC CATHETERIZATION     COLONOSCOPY     CORONARY STENT INTERVENTION N/A 11/06/2017   Procedure: CORONARY STENT INTERVENTION;  Surgeon: Yvonne Kendall, MD;  Location: [facility];  Service: Cardiovascular;  Laterality: N/A;   LEFT HEART CATH AND CORONARY ANGIOGRAPHY N/A 11/06/2017   Procedure: LEFT HEART CATH AND CORONARY ANGIOGRAPHY;  Surgeon: Yvonne Kendall, MD;  Location: [facility];  Service: Cardiovascular;  Laterality: N/A;   LEFT HEART CATH AND CORONARY ANGIOGRAPHY N/A 07/24/2019   Procedure: LEFT HEART CATH AND CORONARY ANGIOGRAPHY;  Surgeon: Kathleene Hazel, MD;  Location: [facility];  Service: Cardiovascular;  Laterality: N/A;   RIGHT/LEFT HEART CATH AND CORONARY ANGIOGRAPHY N/A 09/29/2020   Procedure: RIGHT/LEFT HEART CATH AND CORONARY ANGIOGRAPHY;  Surgeon: Lyn Records, MD;  Location: [facility];  Service: Cardiovascular;  Laterality: N/A;   ROBOTIC  ASSISTED BILATERAL SALPINGO OOPHERECTOMY Bilateral 11/10/2020   Procedure: XI ROBOTIC ASSISTED BILATERAL SALPINGO OOPHORECTOMY WITH MINI LAPAROTOMY FOR DRAINAGE;  Surgeon: Carver Fila, MD;  Location: [facility];  Service: Gynecology;  Laterality: Bilateral;  MINI LAP FIRST   TONSILLECTOMY     VULVECTOMY N/A 11/10/2020   Procedure: WIDE EXCISION VULVECTOMY;  Surgeon: Carver Fila, MD;  Location: [facility];  Service: Gynecology;  Laterality: N/A;   Patient Active Problem List   Diagnosis Date Noted   Intertrigo 04/18/2023   Venous stasis dermatitis of left lower extremity 11/28/2022   Ascending aorta dilation (HCC) 09/13/2022   Dizziness 01/19/2022   Frequent falls 01/19/2022   Tremor of both hands 08/16/2021   Aortic atherosclerosis (HCC) 06/15/2021   Generalized osteoarthritis of multiple sites  12/25/2020   Chronic pain disorder 12/25/2020   Pelvic mass in female    Bilateral lower extremity edema 04/13/2020   Shortness of breath    Anxiety disorder 10/29/2018   CKD (chronic kidney disease), stage III (HCC) 06/27/2018   COPD (chronic obstructive pulmonary disease) (HCC) 05/25/2018   Obesity 03/20/2018   Dyspnea on exertion 11/06/2017   Abnormal stress test 11/06/2017   Chronic heart failure with preserved ejection fraction (HCC) 09/04/2017   Left ventricular dysfunction 07/31/2017   Cataract, nuclear sclerotic senile, bilateral 02/01/2017   Gouty arthritis of toe of left foot 08/09/2016   Hyperuricemia 08/09/2016   Osteoarthritis (arthritis due to wear and tear of joints) 01/14/2014   Atherosclerosis of native coronary artery of native heart with angina pectoris (HCC) 08/02/2011   Mitral regurgitation 08/02/2011   OVERACTIVE BLADDER 07/02/2010   ACUTE CYSTITIS 05/25/2010   Mixed hyperlipidemia 08/11/2009   Leg edema, left 07/22/2009   GERD 04/11/2008   Constipation 04/11/2008   Essential hypertension 05/30/2007   MYOCARDIAL INFARCTION, HX OF 05/30/2007   Allergic rhinitis 05/30/2007   LOW BACK PAIN 05/30/2007    PCP: [place], [name], MD  REFERRING PROVIDER: [place], [name], MD  REFERRING DIAG: R29.6 (ICD-10-CM) - Frequent falls M54.50,G89.29 (ICD-10-CM) - Chronic low back pain without sciatica, unspecified back pain laterality  Rationale for Evaluation and Treatment: Rehabilitation  THERAPY DIAG:  Other low back pain  Muscle weakness (generalized)  Unsteadiness on feet  ONSET DATE: chronic  SUBJECTIVE:                                                                                                                                                                                           SUBJECTIVE STATEMENT: Reports elevated soreness in low back due to rainy weather. Has been compliant with HEP  PERTINENT HISTORY:  Frequent falls Assessment &  Plan: Another fall a week ago, no serious injury. Fall precautions discussed. Last PT a year ago, she felt like it helped, new referral placed.   Orders: -     Ambulatory referral to Physical Therapy Chronic low back pain without sciatica, unspecified back pain laterality Assessment & Plan: No many options for pain management. Tramadol helped, so we decided to continue. PT will be arranged. Side effects discussed. PDMP reviewed. Will sign med contract next visit.   Orders: -     Ambulatory referral to Physical Therapy -     traMADol HCl; Take 0.5 tablets (25 mg total) by mouth every 12 (twelve) hours as needed.  Dispense: 30 tablet; Refill: 1 PAIN:  Are you having pain? Yes: NPRS scale: 8/10 Pain location: low back Pain description: ache Aggravating factors: prolonged sitting, bending over Relieving factors: pain patches,   PRECAUTIONS: Fall  RED FLAGS: None   WEIGHT BEARING RESTRICTIONS: No  FALLS:  Has patient fallen in last 6 months? Yes. Number of falls 1  LIVING ENVIRONMENT: Lives with: lives with their spouse Lives in: House/apartment Stairs:  avoids Has following equipment at home: Single point cane and Environmental consultant - 4 wheeled  OCCUPATION: retired  PLOF: Independent  PATIENT GOALS: To reduce my fall risk and manage my back pain  NEXT MD VISIT: PRN  OBJECTIVE:   DIAGNOSTIC FINDINGS:  none  PATIENT SURVEYS:  FOTO 19(39 predicted)  SCREENING FOR RED FLAGS: none  MUSCLE LENGTH: Hamstrings: WFL in sitting Thomas test: not tested  POSTURE: rounded shoulders, forward head, and increased lumbar lordosis  PALPATION: deferred  LUMBAR ROM: deferred due pain and balance deficits  AROM eval  Flexion   Extension   Right lateral flexion   Left lateral flexion   Right rotation   Left rotation    (Blank rows = not tested)  LOWER EXTREMITY ROM:   WFL for gait and transfers  Active  Right eval Left eval  Hip flexion    Hip extension    Hip  abduction    Hip adduction    Hip internal rotation    Hip external rotation    Knee flexion    Knee extension    Ankle dorsiflexion    Ankle plantarflexion    Ankle inversion    Ankle eversion     (Blank rows = not tested)  LOWER EXTREMITY MMT:    MMT Right eval Left eval  Hip flexion 3+ 3+  Hip extension 3+ 3+  Hip abduction 3+ 3+  Hip adduction    Hip internal rotation    Hip external rotation    Knee flexion 3+ 3+  Knee extension 3+ 3+  Ankle dorsiflexion    Ankle plantarflexion 4 4  Ankle inversion    Ankle eversion     (Blank rows = not tested)  LUMBAR SPECIAL TESTS:  Deferred 07/04/23 Slump test negative B  FUNCTIONAL TESTS:  30 seconds chair stand test 5 reps with BUE support mCTSIB position 4 <5s hold  GAIT: Distance walked: 67ft x2 Assistive device utilized: Walker - 4 wheeled Level of assistance: Modified independence Comments: slow cadence and flexed posture  TODAY'S TREATMENT:    [facility] Adult PT Treatment:                                                DATE: 07/04/23  Neuromuscular re-ed:  Tandem stance on 4 in step with OH reach 5/5 requiring CGA. Therapeutic Activity:   07/04/23 0001  Berg Balance Test  Sit to Stand 2  Standing Unsupported 2  Sitting with Back Unsupported but Feet Supported on Floor or Stool 4  Stand to Sit 3  Transfers 3  Standing Unsupported with Eyes Closed 4  Standing Unsupported with Feet Together 3  From Standing, Reach Forward with Outstretched Arm 3  From Standing Position, Pick up Object from Floor 3  From Standing Position, Turn to Look Behind Over each Shoulder 4  Turn 360 Degrees 2  Standing Unsupported, Alternately Place Feet on Step/Stool 2  Standing Unsupported, One Foot in Front 2  Standing on One Leg 1  Total Score 38   2 MWT 172ft with rollator Standing heel/toe with UE support 10x focus on posture Sidestepping with BUE support 10/10                                                                                                                            DATE: 06/27/23 Eval and HEP    PATIENT EDUCATION:  Education details: Discussed eval findings, rehab rationale and POC and patient is in agreement  Person educated: Patient Education method: Explanation Education comprehension: verbalized understanding and needs further education  HOME EXERCISE PROGRAM: Access Code: [email or web address] URL: [email or web address] Date: 06/27/2023 Prepared by: Gustavus Bryant  Exercises - Sit to Stand with Armchair  - 2 x daily - 5 x weekly - 1 sets - 5 reps - Heel Toe Raises with Counter Support  - 2 x daily - 5 x weekly - 2 sets - 10 reps - Standing [employer] at [employer]  - 2 x daily - 5 x weekly - 2 sets - 10 reps  ASSESSMENT:  CLINICAL IMPRESSION: Focus of today was continued assessment of balance and functional mobility.  BERG score 38/56 indicating balance and strength deficits and elevated fall risk.  2 MWT baseline established.  HEP reviewed and updated to incorporate additional balance tasks.  Patient is a 77 y.o. female who was seen today for physical therapy evaluation and treatment for low back pain and frequent falls. She presents with LE strength deficits as well as diminished balance in vision removed and compliant surface environments.  5x STS time demonstrates functional strength deficits and further balance testing needed to identify specific regions of balance dysfunction.  Suspected underlying BPPV may need to be addressed.    OBJECTIVE IMPAIRMENTS: Abnormal gait, decreased activity tolerance, decreased balance, decreased knowledge of condition, decreased mobility, difficulty walking, decreased strength, decreased safety awareness, dizziness, postural dysfunction, and obesity.   ACTIVITY LIMITATIONS: carrying, lifting, standing, stairs, and locomotion level  PERSONAL FACTORS: Age, Past/current experiences, and Time since onset of injury/illness/exacerbation are also affecting  patient's functional outcome.   REHAB POTENTIAL: Good  CLINICAL DECISION MAKING: Evolving/moderate complexity  EVALUATION COMPLEXITY: Moderate   GOALS: Goals reviewed with patient? No  SHORT TERM GOALS: Target date: 07/25/2023  Patient to demonstrate independence in HEP  Baseline: [email or web address] Goal status: INITIAL  2.  Assess 2 MWT and establish baseline Baseline: TBD; 07/04/23 179ft with rollator Goal status: Met  3.  Increase reps to 7 on 30s chair stand test Baseline: 5 Goal status: INITIAL  LONG TERM GOALS: Target date: 08/22/2023  Increase B LE strength to 4-/5 Baseline:  MMT Right eval Left eval  Hip flexion 3+ 3+  Hip extension 3+ 3+  Hip abduction 3+ 3+  Hip adduction    Hip internal rotation    Hip external rotation    Knee flexion 3+ 3+  Knee extension 3+ 3+   Goal status: INITIAL  2.  Increase FOTO score to 39 Baseline: 19 Goal status: INITIAL  3.  Increase tim eon position 4 of mCTSIB to 20s Baseline: <5s Goal status: INITIAL  4.  Re-asess 2 MWT and note gains Baseline: TBD; 07/04/23 16ft with rollator Goal status: INITIAL   PLAN:  PT FREQUENCY: 1-2x/week  PT DURATION: 6 weeks  PLANNED INTERVENTIONS: Therapeutic exercises, Therapeutic activity, Neuromuscular re-education, Balance training, Gait training, Patient/Family education, Self Care, Joint mobilization, Stair training, Vestibular training, Canalith repositioning, Visual/preceptual remediation/compensation, DME instructions, Dry Needling, Electrical stimulation, Spinal mobilization, Cryotherapy, Moist heat, Manual therapy, and Re-evaluation.  PLAN FOR NEXT SESSION: HEP review and update, manual techniques as appropriate, aerobic tasks, ROM and flexibility activities, strengthening and PREs, TPDN, gait and balance training as needed     Hildred Laser, PT 07/04/2023, 2:52 PM

## 2023-07-03 ENCOUNTER — Other Ambulatory Visit: Payer: Self-pay

## 2023-07-03 MED ORDER — LOSARTAN POTASSIUM 100 MG PO TABS: 100.0000 mg | ORAL_TABLET | Freq: Every day | ORAL | 3 refills | Status: DC

## 2023-07-04 ENCOUNTER — Ambulatory Visit: Payer: Medicare Other

## 2023-07-04 DIAGNOSIS — R2681 Unsteadiness on feet: Secondary | ICD-10-CM

## 2023-07-04 DIAGNOSIS — M6281 Muscle weakness (generalized): Secondary | ICD-10-CM | POA: Diagnosis not present

## 2023-07-04 DIAGNOSIS — M5459 Other low back pain: Secondary | ICD-10-CM | POA: Diagnosis not present

## 2023-07-04 DIAGNOSIS — R296 Repeated falls: Secondary | ICD-10-CM | POA: Diagnosis not present

## 2023-07-04 DIAGNOSIS — M545 Low back pain, unspecified: Secondary | ICD-10-CM | POA: Diagnosis not present

## 2023-07-04 DIAGNOSIS — H8113 Benign paroxysmal vertigo, bilateral: Secondary | ICD-10-CM | POA: Diagnosis not present

## 2023-07-05 NOTE — Therapy (Unsigned)
OUTPATIENT PHYSICAL THERAPY TREATMENT NOTE   Patient Name: Tracy [name] MRN: [id number] DOB:Apr 09, 1946, 77 y.o., female Today's Date: 07/06/2023  END OF SESSION:  PT End of Session - 07/06/23 1400     Visit Number 3    Number of Visits 12    Date for PT Re-Evaluation 08/27/23    Authorization Type MCR    PT Start Time 1400    PT Stop Time 1440    PT Time Calculation (min) 40 min    Activity Tolerance Patient tolerated treatment well    Behavior During Therapy WFL for tasks assessed/performed               Past Medical History:  Diagnosis Date   Allergic rhinitis    Allergy    Anxiety    Arthritis    Ascending aorta dilation (HCC) 09/13/2022   Echo 09/12/2022: EF 50-55, GR 1 DD, mildly reduced RVSF, normal PASP (RVSP 17.7), mild dilation of ascending aorta (38 mm), RAP 3   Breast cancer (HCC) 04/28/2020   rigtht breast   Bronchitis    CAD (coronary artery disease)    1/19 PCI/DES to pLCX for ISR, normal EF.    Cancer (HCC)    hx of precancerous cells in right breast    Cataract    Cervical dysplasia    unsure of procedure, possible "burning" in her late 23s   CHF (congestive heart failure) (HCC)    Echo 06/2019: EF 55-60, elevated LVEDP, normal RV SF, mild MAC, mild MR, trivial TR   Complication of anesthesia    COPD (chronic obstructive pulmonary disease) (HCC)    early    Depression    Dyspnea    with exertion    Family history of adverse reaction to anesthesia    daughter- problems wiht n/v   GERD (gastroesophageal reflux disease)    Gout    Headache(784.0)    Heart murmur    hx of years ago    Hyperlipidemia    Hypertension    Low back pain    Menopausal syndrome    Myocardial infarct (HCC) 2007   hx of   Overactive bladder    PONV (postoperative nausea and vomiting)    Sleep apnea    Past Surgical History:  Procedure Laterality Date   ANGIOPLASTY     stent 2007   BREAST LUMPECTOMY WITH RADIOACTIVE SEED LOCALIZATION Right 04/28/2020    Procedure: RIGHT BREAST LUMPECTOMY X 2  WITH RADIOACTIVE SEED LOCALIZATION;  Surgeon: Manus Rudd, MD;  Location: [facility];  Service: General;  Laterality: Right;  LMA   CARDIAC CATHETERIZATION     COLONOSCOPY     CORONARY STENT INTERVENTION N/A 11/06/2017   Procedure: CORONARY STENT INTERVENTION;  Surgeon: Yvonne Kendall, MD;  Location: [facility];  Service: Cardiovascular;  Laterality: N/A;   LEFT HEART CATH AND CORONARY ANGIOGRAPHY N/A 11/06/2017   Procedure: LEFT HEART CATH AND CORONARY ANGIOGRAPHY;  Surgeon: Yvonne Kendall, MD;  Location: [facility];  Service: Cardiovascular;  Laterality: N/A;   LEFT HEART CATH AND CORONARY ANGIOGRAPHY N/A 07/24/2019   Procedure: LEFT HEART CATH AND CORONARY ANGIOGRAPHY;  Surgeon: Kathleene Hazel, MD;  Location: [facility];  Service: Cardiovascular;  Laterality: N/A;   RIGHT/LEFT HEART CATH AND CORONARY ANGIOGRAPHY N/A 09/29/2020   Procedure: RIGHT/LEFT HEART CATH AND CORONARY ANGIOGRAPHY;  Surgeon: Lyn Records, MD;  Location: [facility];  Service: Cardiovascular;  Laterality: N/A;  ROBOTIC ASSISTED BILATERAL SALPINGO OOPHERECTOMY Bilateral 11/10/2020   Procedure: XI ROBOTIC ASSISTED BILATERAL SALPINGO OOPHORECTOMY WITH MINI LAPAROTOMY FOR DRAINAGE;  Surgeon: Carver Fila, MD;  Location: [facility];  Service: Gynecology;  Laterality: Bilateral;  MINI LAP FIRST   TONSILLECTOMY     VULVECTOMY N/A 11/10/2020   Procedure: WIDE EXCISION VULVECTOMY;  Surgeon: Carver Fila, MD;  Location: [facility];  Service: Gynecology;  Laterality: N/A;   Patient Active Problem List   Diagnosis Date Noted   Intertrigo 04/18/2023   Venous stasis dermatitis of left lower extremity 11/28/2022   Ascending aorta dilation (HCC) 09/13/2022   Dizziness 01/19/2022   Frequent falls 01/19/2022   Tremor of both hands 08/16/2021   Aortic atherosclerosis (HCC) 06/15/2021   Generalized osteoarthritis of multiple  sites 12/25/2020   Chronic pain disorder 12/25/2020   Pelvic mass in female    Bilateral lower extremity edema 04/13/2020   Shortness of breath    Anxiety disorder 10/29/2018   CKD (chronic kidney disease), stage III (HCC) 06/27/2018   COPD (chronic obstructive pulmonary disease) (HCC) 05/25/2018   Obesity 03/20/2018   Dyspnea on exertion 11/06/2017   Abnormal stress test 11/06/2017   Chronic heart failure with preserved ejection fraction (HCC) 09/04/2017   Left ventricular dysfunction 07/31/2017   Cataract, nuclear sclerotic senile, bilateral 02/01/2017   Gouty arthritis of toe of left foot 08/09/2016   Hyperuricemia 08/09/2016   Osteoarthritis (arthritis due to wear and tear of joints) 01/14/2014   Atherosclerosis of native coronary artery of native heart with angina pectoris (HCC) 08/02/2011   Mitral regurgitation 08/02/2011   OVERACTIVE BLADDER 07/02/2010   ACUTE CYSTITIS 05/25/2010   Mixed hyperlipidemia 08/11/2009   Leg edema, left 07/22/2009   GERD 04/11/2008   Constipation 04/11/2008   Essential hypertension 05/30/2007   MYOCARDIAL INFARCTION, HX OF 05/30/2007   Allergic rhinitis 05/30/2007   LOW BACK PAIN 05/30/2007    PCP: [place], [name], MD  REFERRING PROVIDER: [place], [name], MD  REFERRING DIAG: R29.6 (ICD-10-CM) - Frequent falls M54.50,G89.29 (ICD-10-CM) - Chronic low back pain without sciatica, unspecified back pain laterality  Rationale for Evaluation and Treatment: Rehabilitation  THERAPY DIAG:  Other low back pain  Repeated falls  Muscle weakness (generalized)  ONSET DATE: chronic  SUBJECTIVE:                                                                                                                                                                                           SUBJECTIVE STATEMENT: Back still sore from damp, cool weather  PERTINENT HISTORY:  Frequent falls Assessment & Plan: Another fall a week ago, no serious  injury. Fall  precautions discussed. Last PT a year ago, she felt like it helped, new referral placed.   Orders: -     Ambulatory referral to Physical Therapy Chronic low back pain without sciatica, unspecified back pain laterality Assessment & Plan: No many options for pain management. Tramadol helped, so we decided to continue. PT will be arranged. Side effects discussed. PDMP reviewed. Will sign med contract next visit.   Orders: -     Ambulatory referral to Physical Therapy -     traMADol HCl; Take 0.5 tablets (25 mg total) by mouth every 12 (twelve) hours as needed.  Dispense: 30 tablet; Refill: 1 PAIN:  Are you having pain? Yes: NPRS scale: 8/10 Pain location: low back Pain description: ache Aggravating factors: prolonged sitting, bending over Relieving factors: pain patches,   PRECAUTIONS: Fall  RED FLAGS: None   WEIGHT BEARING RESTRICTIONS: No  FALLS:  Has patient fallen in last 6 months? Yes. Number of falls 1  LIVING ENVIRONMENT: Lives with: lives with their spouse Lives in: House/apartment Stairs:  avoids Has following equipment at home: Single point cane and Environmental consultant - 4 wheeled  OCCUPATION: retired  PLOF: Independent  PATIENT GOALS: To reduce my fall risk and manage my back pain  NEXT MD VISIT: PRN  OBJECTIVE:   DIAGNOSTIC FINDINGS:  none  PATIENT SURVEYS:  FOTO 19(39 predicted)  SCREENING FOR RED FLAGS: none  MUSCLE LENGTH: Hamstrings: WFL in sitting Thomas test: not tested  POSTURE: rounded shoulders, forward head, and increased lumbar lordosis  PALPATION: deferred  LUMBAR ROM: deferred due pain and balance deficits  AROM eval  Flexion   Extension   Right lateral flexion   Left lateral flexion   Right rotation   Left rotation    (Blank rows = not tested)  LOWER EXTREMITY ROM:   WFL for gait and transfers  Active  Right eval Left eval  Hip flexion    Hip extension    Hip abduction    Hip adduction    Hip internal rotation    Hip  external rotation    Knee flexion    Knee extension    Ankle dorsiflexion    Ankle plantarflexion    Ankle inversion    Ankle eversion     (Blank rows = not tested)  LOWER EXTREMITY MMT:    MMT Right eval Left eval  Hip flexion 3+ 3+  Hip extension 3+ 3+  Hip abduction 3+ 3+  Hip adduction    Hip internal rotation    Hip external rotation    Knee flexion 3+ 3+  Knee extension 3+ 3+  Ankle dorsiflexion    Ankle plantarflexion 4 4  Ankle inversion    Ankle eversion     (Blank rows = not tested)  LUMBAR SPECIAL TESTS:  Deferred 07/04/23 Slump test negative B  FUNCTIONAL TESTS:  30 seconds chair stand test 5 reps with BUE support mCTSIB position 4 <5s hold  GAIT: Distance walked: 72ft x2 Assistive device utilized: Walker - 4 wheeled Level of assistance: Modified independence Comments: slow cadence and flexed posture  TODAY'S TREATMENT:    [facility] Adult PT Treatment:                                                DATE: 07/06/23 Therapeutic Exercise: Darlyn Chamber 2# 2x10 B Supine march 10/10 x2  LTR B 30s x2 Supine hip fallouts RTB 15x B, 15/15 unilaterally Bridge 10x  WS onto 4 in block with OH reach 10/10  [facility] Adult PT Treatment:                                                DATE: 07/04/23  Neuromuscular re-ed: Tandem stance on 4 in step with OH reach 5/5 requiring CGA. Therapeutic Activity:   07/04/23 0001  Berg Balance Test  Sit to Stand 2  Standing Unsupported 2  Sitting with Back Unsupported but Feet Supported on Floor or Stool 4  Stand to Sit 3  Transfers 3  Standing Unsupported with Eyes Closed 4  Standing Unsupported with Feet Together 3  From Standing, Reach Forward with Outstretched Arm 3  From Standing Position, Pick up Object from Floor 3  From Standing Position, Turn to Look Behind Over each Shoulder 4  Turn 360 Degrees 2  Standing Unsupported, Alternately Place Feet on Step/Stool 2  Standing Unsupported, One Foot in Front 2  Standing on One Leg 1   Total Score 38   2 MWT 171ft with rollator Standing heel/toe with UE support 10x focus on posture Sidestepping with BUE support 10/10                                                                                                                           DATE: 06/27/23 Eval and HEP    PATIENT EDUCATION:  Education details: Discussed eval findings, rehab rationale and POC and patient is in agreement  Person educated: Patient Education method: Explanation Education comprehension: verbalized understanding and needs further education  HOME EXERCISE PROGRAM: Access Code: [email or web address] URL: [email or web address] Date: 06/27/2023 Prepared by: Gustavus Bryant  Exercises - Sit to Stand with Armchair  - 2 x daily - 5 x weekly - 1 sets - 5 reps - Heel Toe Raises with Counter Support  - 2 x daily - 5 x weekly - 2 sets - 10 reps - Standing [employer] at [employer]  - 2 x daily - 5 x weekly - 2 sets - 10 reps  ASSESSMENT:  CLINICAL IMPRESSION: Today's session focused on LE and trunk strengthening, stretching and flexibility tasks.  Added supine tasks for Lemobility and trunk/hip strengthening.  Advanced to standing proprioceptive tasks with UE support  Patient is a 77 y.o. female who was seen today for physical therapy evaluation and treatment for low back pain and frequent falls. She presents with LE strength deficits as well as diminished balance in vision removed and compliant surface environments.  5x STS time demonstrates functional strength deficits and further balance testing needed to identify specific regions of balance dysfunction.  Suspected underlying BPPV may need to be addressed.    OBJECTIVE IMPAIRMENTS: Abnormal gait, decreased activity tolerance, decreased balance, decreased knowledge of condition, decreased mobility, difficulty  walking, decreased strength, decreased safety awareness, dizziness, postural dysfunction, and obesity.   ACTIVITY LIMITATIONS: carrying,  lifting, standing, stairs, and locomotion level  PERSONAL FACTORS: Age, Past/current experiences, and Time since onset of injury/illness/exacerbation are also affecting patient's functional outcome.   REHAB POTENTIAL: Good  CLINICAL DECISION MAKING: Evolving/moderate complexity  EVALUATION COMPLEXITY: Moderate   GOALS: Goals reviewed with patient? No  SHORT TERM GOALS: Target date: 07/25/2023  Patient to demonstrate independence in HEP  Baseline: [email or web address] Goal status: INITIAL  2.  Assess 2 MWT and establish baseline Baseline: TBD; 07/04/23 124ft with rollator Goal status: Met  3.  Increase reps to 7 on 30s chair stand test Baseline: 5 Goal status: INITIAL  LONG TERM GOALS: Target date: 08/22/2023  Increase B LE strength to 4-/5 Baseline:  MMT Right eval Left eval  Hip flexion 3+ 3+  Hip extension 3+ 3+  Hip abduction 3+ 3+  Hip adduction    Hip internal rotation    Hip external rotation    Knee flexion 3+ 3+  Knee extension 3+ 3+   Goal status: INITIAL  2.  Increase FOTO score to 39 Baseline: 19 Goal status: INITIAL  3.  Increase tim eon position 4 of mCTSIB to 20s Baseline: <5s Goal status: INITIAL  4.  Re-asess 2 MWT and note gains Baseline: TBD; 07/04/23 191ft with rollator Goal status: INITIAL   PLAN:  PT FREQUENCY: 1-2x/week  PT DURATION: 6 weeks  PLANNED INTERVENTIONS: Therapeutic exercises, Therapeutic activity, Neuromuscular re-education, Balance training, Gait training, Patient/Family education, Self Care, Joint mobilization, Stair training, Vestibular training, Canalith repositioning, Visual/preceptual remediation/compensation, DME instructions, Dry Needling, Electrical stimulation, Spinal mobilization, Cryotherapy, Moist heat, Manual therapy, and Re-evaluation.  PLAN FOR NEXT SESSION: HEP review and update, manual techniques as appropriate, aerobic tasks, ROM and flexibility activities, strengthening and PREs, TPDN, gait and balance training  as needed     Hildred Laser, PT 07/06/2023, 2:39 PM

## 2023-07-06 ENCOUNTER — Ambulatory Visit: Payer: Medicare Other

## 2023-07-06 DIAGNOSIS — R296 Repeated falls: Secondary | ICD-10-CM

## 2023-07-06 DIAGNOSIS — M5459 Other low back pain: Secondary | ICD-10-CM | POA: Diagnosis not present

## 2023-07-06 DIAGNOSIS — M6281 Muscle weakness (generalized): Secondary | ICD-10-CM

## 2023-07-06 DIAGNOSIS — H8113 Benign paroxysmal vertigo, bilateral: Secondary | ICD-10-CM | POA: Diagnosis not present

## 2023-07-06 DIAGNOSIS — R2681 Unsteadiness on feet: Secondary | ICD-10-CM | POA: Diagnosis not present

## 2023-07-06 DIAGNOSIS — M545 Low back pain, unspecified: Secondary | ICD-10-CM | POA: Diagnosis not present

## 2023-07-11 ENCOUNTER — Other Ambulatory Visit: Payer: Medicare Other

## 2023-07-12 ENCOUNTER — Ambulatory Visit: Payer: Medicare Other

## 2023-07-12 DIAGNOSIS — R2681 Unsteadiness on feet: Secondary | ICD-10-CM | POA: Diagnosis not present

## 2023-07-12 DIAGNOSIS — R296 Repeated falls: Secondary | ICD-10-CM | POA: Diagnosis not present

## 2023-07-12 DIAGNOSIS — M5459 Other low back pain: Secondary | ICD-10-CM | POA: Diagnosis not present

## 2023-07-12 DIAGNOSIS — H8113 Benign paroxysmal vertigo, bilateral: Secondary | ICD-10-CM | POA: Diagnosis not present

## 2023-07-12 DIAGNOSIS — M6281 Muscle weakness (generalized): Secondary | ICD-10-CM | POA: Diagnosis not present

## 2023-07-12 DIAGNOSIS — M545 Low back pain, unspecified: Secondary | ICD-10-CM | POA: Diagnosis not present

## 2023-07-12 NOTE — Therapy (Signed)
OUTPATIENT PHYSICAL THERAPY TREATMENT NOTE   Patient Name: Tracy [name] MRN: [id number] DOB:21-Jun-1946, 77 y.o., female Today's Date: 07/14/2023  END OF SESSION:  PT End of Session - 07/14/23 1305     Visit Number 5    Number of Visits 12    Date for PT Re-Evaluation 08/27/23    Authorization Type MCR    PT Start Time 1300    PT Stop Time 1340    PT Time Calculation (min) 40 min    Activity Tolerance Patient tolerated treatment well    Behavior During Therapy WFL for tasks assessed/performed              Past Medical History:  Diagnosis Date   Allergic rhinitis    Allergy    Anxiety    Arthritis    Ascending aorta dilation (HCC) 09/13/2022   Echo 09/12/2022: EF 50-55, GR 1 DD, mildly reduced RVSF, normal PASP (RVSP 17.7), mild dilation of ascending aorta (38 mm), RAP 3   Breast cancer (HCC) 04/28/2020   rigtht breast   Bronchitis    CAD (coronary artery disease)    1/19 PCI/DES to pLCX for ISR, normal EF.    Cancer (HCC)    hx of precancerous cells in right breast    Cataract    Cervical dysplasia    unsure of procedure, possible "burning" in her late 2s   CHF (congestive heart failure) (HCC)    Echo 06/2019: EF 55-60, elevated LVEDP, normal RV SF, mild MAC, mild MR, trivial TR   Complication of anesthesia    COPD (chronic obstructive pulmonary disease) (HCC)    early    Depression    Dyspnea    with exertion    Family history of adverse reaction to anesthesia    daughter- problems wiht n/v   GERD (gastroesophageal reflux disease)    Gout    Headache(784.0)    Heart murmur    hx of years ago    Hyperlipidemia    Hypertension    Low back pain    Menopausal syndrome    Myocardial infarct (HCC) 2007   hx of   Overactive bladder    PONV (postoperative nausea and vomiting)    Sleep apnea    Past Surgical History:  Procedure Laterality Date   ANGIOPLASTY     stent 2007   BREAST LUMPECTOMY WITH RADIOACTIVE SEED LOCALIZATION Right 04/28/2020    Procedure: RIGHT BREAST LUMPECTOMY X 2  WITH RADIOACTIVE SEED LOCALIZATION;  Surgeon: Manus Rudd, MD;  Location: [facility];  Service: General;  Laterality: Right;  LMA   CARDIAC CATHETERIZATION     COLONOSCOPY     CORONARY STENT INTERVENTION N/A 11/06/2017   Procedure: CORONARY STENT INTERVENTION;  Surgeon: Yvonne Kendall, MD;  Location: [facility];  Service: Cardiovascular;  Laterality: N/A;   LEFT HEART CATH AND CORONARY ANGIOGRAPHY N/A 11/06/2017   Procedure: LEFT HEART CATH AND CORONARY ANGIOGRAPHY;  Surgeon: Yvonne Kendall, MD;  Location: [facility];  Service: Cardiovascular;  Laterality: N/A;   LEFT HEART CATH AND CORONARY ANGIOGRAPHY N/A 07/24/2019   Procedure: LEFT HEART CATH AND CORONARY ANGIOGRAPHY;  Surgeon: Kathleene Hazel, MD;  Location: [facility];  Service: Cardiovascular;  Laterality: N/A;   RIGHT/LEFT HEART CATH AND CORONARY ANGIOGRAPHY N/A 09/29/2020   Procedure: RIGHT/LEFT HEART CATH AND CORONARY ANGIOGRAPHY;  Surgeon: Lyn Records, MD;  Location: [facility];  Service: Cardiovascular;  Laterality: N/A;   ROBOTIC  ASSISTED BILATERAL SALPINGO OOPHERECTOMY Bilateral 11/10/2020   Procedure: XI ROBOTIC ASSISTED BILATERAL SALPINGO OOPHORECTOMY WITH MINI LAPAROTOMY FOR DRAINAGE;  Surgeon: Carver Fila, MD;  Location: [facility];  Service: Gynecology;  Laterality: Bilateral;  MINI LAP FIRST   TONSILLECTOMY     VULVECTOMY N/A 11/10/2020   Procedure: WIDE EXCISION VULVECTOMY;  Surgeon: Carver Fila, MD;  Location: [facility];  Service: Gynecology;  Laterality: N/A;   Patient Active Problem List   Diagnosis Date Noted   Intertrigo 04/18/2023   Venous stasis dermatitis of left lower extremity 11/28/2022   Ascending aorta dilation (HCC) 09/13/2022   Dizziness 01/19/2022   Frequent falls 01/19/2022   Tremor of both hands 08/16/2021   Aortic atherosclerosis (HCC) 06/15/2021   Generalized osteoarthritis of multiple sites  12/25/2020   Chronic pain disorder 12/25/2020   Pelvic mass in female    Bilateral lower extremity edema 04/13/2020   Shortness of breath    Anxiety disorder 10/29/2018   CKD (chronic kidney disease), stage III (HCC) 06/27/2018   COPD (chronic obstructive pulmonary disease) (HCC) 05/25/2018   Obesity 03/20/2018   Dyspnea on exertion 11/06/2017   Abnormal stress test 11/06/2017   Chronic heart failure with preserved ejection fraction (HCC) 09/04/2017   Left ventricular dysfunction 07/31/2017   Cataract, nuclear sclerotic senile, bilateral 02/01/2017   Gouty arthritis of toe of left foot 08/09/2016   Hyperuricemia 08/09/2016   Osteoarthritis (arthritis due to wear and tear of joints) 01/14/2014   Atherosclerosis of native coronary artery of native heart with angina pectoris (HCC) 08/02/2011   Mitral regurgitation 08/02/2011   OVERACTIVE BLADDER 07/02/2010   ACUTE CYSTITIS 05/25/2010   Mixed hyperlipidemia 08/11/2009   Leg edema, left 07/22/2009   GERD 04/11/2008   Constipation 04/11/2008   Essential hypertension 05/30/2007   MYOCARDIAL INFARCTION, HX OF 05/30/2007   Allergic rhinitis 05/30/2007   LOW BACK PAIN 05/30/2007    PCP: [place], [name], MD  REFERRING PROVIDER: [place], [name], MD  REFERRING DIAG: R29.6 (ICD-10-CM) - Frequent falls M54.50,G89.29 (ICD-10-CM) - Chronic low back pain without sciatica, unspecified back pain laterality  Rationale for Evaluation and Treatment: Rehabilitation  THERAPY DIAG:  Other low back pain  Repeated falls  Muscle weakness (generalized)  ONSET DATE: chronic  SUBJECTIVE:                                                                                                                                                                                           SUBJECTIVE STATEMENT: Less difficulty arising from seated positions.  Still experiences stiffness in AM which gets better with   PERTINENT HISTORY:  Frequent falls Assessment &  Plan: Another fall a week ago, no serious injury. Fall precautions discussed. Last PT a year ago, she felt like it helped, new referral placed.   Orders: -     Ambulatory referral to Physical Therapy Chronic low back pain without sciatica, unspecified back pain laterality Assessment & Plan: No many options for pain management. Tramadol helped, so we decided to continue. PT will be arranged. Side effects discussed. PDMP reviewed. Will sign med contract next visit.   Orders: -     Ambulatory referral to Physical Therapy -     traMADol HCl; Take 0.5 tablets (25 mg total) by mouth every 12 (twelve) hours as needed.  Dispense: 30 tablet; Refill: 1 PAIN:  Are you having pain? Yes: NPRS scale: 5/10 Pain location: low back Pain description: ache Aggravating factors: prolonged sitting, bending over Relieving factors: pain patches,   PRECAUTIONS: Fall  RED FLAGS: None   WEIGHT BEARING RESTRICTIONS: No  FALLS:  Has patient fallen in last 6 months? Yes. Number of falls 1  LIVING ENVIRONMENT: Lives with: lives with their spouse Lives in: House/apartment Stairs:  avoids Has following equipment at home: Single point cane and Environmental consultant - 4 wheeled  OCCUPATION: retired  PLOF: Independent  PATIENT GOALS: To reduce my fall risk and manage my back pain  NEXT MD VISIT: PRN  OBJECTIVE:   DIAGNOSTIC FINDINGS:  none  PATIENT SURVEYS:  FOTO 19(39 predicted)  SCREENING FOR RED FLAGS: none  MUSCLE LENGTH: Hamstrings: WFL in sitting Thomas test: not tested  POSTURE: rounded shoulders, forward head, and increased lumbar lordosis  PALPATION: deferred  LUMBAR ROM: deferred due pain and balance deficits  AROM eval  Flexion   Extension   Right lateral flexion   Left lateral flexion   Right rotation   Left rotation    (Blank rows = not tested)  LOWER EXTREMITY ROM:   WFL for gait and transfers  Active  Right eval Left eval  Hip flexion    Hip extension    Hip  abduction    Hip adduction    Hip internal rotation    Hip external rotation    Knee flexion    Knee extension    Ankle dorsiflexion    Ankle plantarflexion    Ankle inversion    Ankle eversion     (Blank rows = not tested)  LOWER EXTREMITY MMT:    MMT Right eval Left eval  Hip flexion 3+ 3+  Hip extension 3+ 3+  Hip abduction 3+ 3+  Hip adduction    Hip internal rotation    Hip external rotation    Knee flexion 3+ 3+  Knee extension 3+ 3+  Ankle dorsiflexion    Ankle plantarflexion 4 4  Ankle inversion    Ankle eversion     (Blank rows = not tested)  LUMBAR SPECIAL TESTS:  Deferred 07/04/23 Slump test negative B  FUNCTIONAL TESTS:  30 seconds chair stand test 5 reps with BUE support mCTSIB position 4 <5s hold  GAIT: Distance walked: 50ft x2 Assistive device utilized: Walker - 4 wheeled Level of assistance: Modified independence Comments: slow cadence and flexed posture  TODAY'S TREATMENT:    [facility] Adult PT Treatment:                                                DATE: 07/14/23 Therapeutic Exercise: Supine  hip fallouts GTB 10x B, 10/10 unilaterally Supine march against GTB 10/10 with SKTC stretch by patient. Bridge against GTB 10x S/L clams GTB 10/10 Supine OH press with inspiration to promote thoracic mobility FAQs 3# on foot to promote DF, 2s hold 15x B Nustep L4 6 min 5x STS focus on eccentric component   [facility] Adult PT Treatment:                                                DATE: 07/12/23 Therapeutic Exercise: Nustep level 4 x 5 mins while gathering subjective info Sidestepping at countertop x3 laps Standing hip extension x10 BIL Heel raises at counter 2x10 WS onto 4 in block with OH reach 10/10 LAQs 2# 2x10 B Seated marching 2# 2x30" Supine march 10/10 x2 LTR B 30s x2 Supine hip fallouts RTB 15x B Bridge 10x    [facility] Adult PT Treatment:                                                DATE: 07/06/23 Therapeutic Exercise: LAQs 2# 2x10 B Supine  march 10/10 x2 LTR B 30s x2 Supine hip fallouts RTB 15x B, 15/15 unilaterally Bridge 10x  WS onto 4 in block with OH reach 10/10  [facility] Adult PT Treatment:                                                DATE: 07/04/23  Neuromuscular re-ed: Tandem stance on 4 in step with OH reach 5/5 requiring CGA. Therapeutic Activity:   07/04/23 0001  Berg Balance Test  Sit to Stand 2  Standing Unsupported 2  Sitting with Back Unsupported but Feet Supported on Floor or Stool 4  Stand to Sit 3  Transfers 3  Standing Unsupported with Eyes Closed 4  Standing Unsupported with Feet Together 3  From Standing, Reach Forward with Outstretched Arm 3  From Standing Position, Pick up Object from Floor 3  From Standing Position, Turn to Look Behind Over each Shoulder 4  Turn 360 Degrees 2  Standing Unsupported, Alternately Place Feet on Step/Stool 2  Standing Unsupported, One Foot in Front 2  Standing on One Leg 1  Total Score 38   2 MWT 139ft with rollator Standing heel/toe with UE support 10x focus on posture Sidestepping with BUE support 10/10                                                                                                                             PATIENT EDUCATION:  Education details: Discussed eval findings,  rehab rationale and POC and patient is in agreement  Person educated: Patient Education method: Explanation Education comprehension: verbalized understanding and needs further education  HOME EXERCISE PROGRAM: Access Code: [email or web address] URL: [email or web address] Date: 06/27/2023 Prepared by: Gustavus Bryant  Exercises - Sit to Stand with Armchair  - 2 x daily - 5 x weekly - 1 sets - 5 reps - Heel Toe Raises with Counter Support  - 2 x daily - 5 x weekly - 2 sets - 10 reps - Standing [employer] at [employer]  - 2 x daily - 5 x weekly - 2 sets - 10 reps  ASSESSMENT:  CLINICAL IMPRESSION: Able to arise from lobby chair with less struggle.  Incorporated  additional hip strengthening strategies against GTB for strength and stretch.  Increased reps and resistance as noted.  Practiced eccentric component of STS tasks with improved control noted with repetition as patient able to descend w/o UE support.   Patient is a 77 y.o. female who was seen today for physical therapy evaluation and treatment for low back pain and frequent falls. She presents with LE strength deficits as well as diminished balance in vision removed and compliant surface environments.  5x STS time demonstrates functional strength deficits and further balance testing needed to identify specific regions of balance dysfunction.  Suspected underlying BPPV may need to be addressed.    OBJECTIVE IMPAIRMENTS: Abnormal gait, decreased activity tolerance, decreased balance, decreased knowledge of condition, decreased mobility, difficulty walking, decreased strength, decreased safety awareness, dizziness, postural dysfunction, and obesity.   ACTIVITY LIMITATIONS: carrying, lifting, standing, stairs, and locomotion level  PERSONAL FACTORS: Age, Past/current experiences, and Time since onset of injury/illness/exacerbation are also affecting patient's functional outcome.   REHAB POTENTIAL: Good  CLINICAL DECISION MAKING: Evolving/moderate complexity  EVALUATION COMPLEXITY: Moderate   GOALS: Goals reviewed with patient? No  SHORT TERM GOALS: Target date: 07/25/2023  Patient to demonstrate independence in HEP  Baseline: [email or web address] Goal status: INITIAL  2.  Assess 2 MWT and establish baseline Baseline: TBD; 07/04/23 177ft with rollator Goal status: Met  3.  Increase reps to 7 on 30s chair stand test Baseline: 5 Goal status: INITIAL  LONG TERM GOALS: Target date: 08/22/2023  Increase B LE strength to 4-/5 Baseline:  MMT Right eval Left eval  Hip flexion 3+ 3+  Hip extension 3+ 3+  Hip abduction 3+ 3+  Hip adduction    Hip internal rotation    Hip external rotation    Knee  flexion 3+ 3+  Knee extension 3+ 3+   Goal status: INITIAL  2.  Increase FOTO score to 39 Baseline: 19 Goal status: INITIAL  3.  Increase tim eon position 4 of mCTSIB to 20s Baseline: <5s Goal status: INITIAL  4.  Re-asess 2 MWT and note gains Baseline: TBD; 07/04/23 19ft with rollator Goal status: INITIAL   PLAN:  PT FREQUENCY: 1-2x/week  PT DURATION: 6 weeks  PLANNED INTERVENTIONS: Therapeutic exercises, Therapeutic activity, Neuromuscular re-education, Balance training, Gait training, Patient/Family education, Self Care, Joint mobilization, Stair training, Vestibular training, Canalith repositioning, Visual/preceptual remediation/compensation, DME instructions, Dry Needling, Electrical stimulation, Spinal mobilization, Cryotherapy, Moist heat, Manual therapy, and Re-evaluation.  PLAN FOR NEXT SESSION: HEP review and update, manual techniques as appropriate, aerobic tasks, ROM and flexibility activities, strengthening and PREs, TPDN, gait and balance training as needed    Hildred Laser, PT 07/14/2023, 1:48 PM

## 2023-07-12 NOTE — Therapy (Signed)
OUTPATIENT PHYSICAL THERAPY TREATMENT NOTE   Patient Name: Tracy [name] MRN: [id number] DOB:1946-09-21, 77 y.o., female Today's Date: 07/12/2023  END OF SESSION:  PT End of Session - 07/12/23 1249     Visit Number 4    Number of Visits 12    Date for PT Re-Evaluation 08/27/23    Authorization Type MCR    PT Start Time 1250    PT Stop Time 1330    PT Time Calculation (min) 40 min    Activity Tolerance Patient tolerated treatment well    Behavior During Therapy [facility] for tasks assessed/performed             Past Medical History:  Diagnosis Date   Allergic rhinitis    Allergy    Anxiety    Arthritis    Ascending aorta dilation (HCC) 09/13/2022   Echo 09/12/2022: EF 50-55, GR 1 DD, mildly reduced RVSF, normal PASP (RVSP 17.7), mild dilation of ascending aorta (38 mm), RAP 3   Breast cancer (HCC) 04/28/2020   rigtht breast   Bronchitis    CAD (coronary artery disease)    1/19 PCI/DES to pLCX for ISR, normal EF.    Cancer (HCC)    hx of precancerous cells in right breast    Cataract    Cervical dysplasia    unsure of procedure, possible "burning" in her late 61s   CHF (congestive heart failure) (HCC)    Echo 06/2019: EF 55-60, elevated LVEDP, normal RV SF, mild MAC, mild MR, trivial TR   Complication of anesthesia    COPD (chronic obstructive pulmonary disease) (HCC)    early    Depression    Dyspnea    with exertion    Family history of adverse reaction to anesthesia    daughter- problems wiht n/v   GERD (gastroesophageal reflux disease)    Gout    Headache(784.0)    Heart murmur    hx of years ago    Hyperlipidemia    Hypertension    Low back pain    Menopausal syndrome    Myocardial infarct (HCC) 2007   hx of   Overactive bladder    PONV (postoperative nausea and vomiting)    Sleep apnea    Past Surgical History:  Procedure Laterality Date   ANGIOPLASTY     stent 2007   BREAST LUMPECTOMY WITH RADIOACTIVE SEED LOCALIZATION Right 04/28/2020    Procedure: RIGHT BREAST LUMPECTOMY X 2  WITH RADIOACTIVE SEED LOCALIZATION;  Surgeon: Manus Rudd, MD;  Location: [facility];  Service: General;  Laterality: Right;  LMA   CARDIAC CATHETERIZATION     COLONOSCOPY     CORONARY STENT INTERVENTION N/A 11/06/2017   Procedure: CORONARY STENT INTERVENTION;  Surgeon: Yvonne Kendall, MD;  Location: [facility];  Service: Cardiovascular;  Laterality: N/A;   LEFT HEART CATH AND CORONARY ANGIOGRAPHY N/A 11/06/2017   Procedure: LEFT HEART CATH AND CORONARY ANGIOGRAPHY;  Surgeon: Yvonne Kendall, MD;  Location: [facility];  Service: Cardiovascular;  Laterality: N/A;   LEFT HEART CATH AND CORONARY ANGIOGRAPHY N/A 07/24/2019   Procedure: LEFT HEART CATH AND CORONARY ANGIOGRAPHY;  Surgeon: Kathleene Hazel, MD;  Location: [facility];  Service: Cardiovascular;  Laterality: N/A;   RIGHT/LEFT HEART CATH AND CORONARY ANGIOGRAPHY N/A 09/29/2020   Procedure: RIGHT/LEFT HEART CATH AND CORONARY ANGIOGRAPHY;  Surgeon: Lyn Records, MD;  Location: [facility];  Service: Cardiovascular;  Laterality: N/A;   ROBOTIC ASSISTED  BILATERAL SALPINGO OOPHERECTOMY Bilateral 11/10/2020   Procedure: XI ROBOTIC ASSISTED BILATERAL SALPINGO OOPHORECTOMY WITH MINI LAPAROTOMY FOR DRAINAGE;  Surgeon: Carver Fila, MD;  Location: [facility];  Service: Gynecology;  Laterality: Bilateral;  MINI LAP FIRST   TONSILLECTOMY     VULVECTOMY N/A 11/10/2020   Procedure: WIDE EXCISION VULVECTOMY;  Surgeon: Carver Fila, MD;  Location: [facility];  Service: Gynecology;  Laterality: N/A;   Patient Active Problem List   Diagnosis Date Noted   Intertrigo 04/18/2023   Venous stasis dermatitis of left lower extremity 11/28/2022   Ascending aorta dilation (HCC) 09/13/2022   Dizziness 01/19/2022   Frequent falls 01/19/2022   Tremor of both hands 08/16/2021   Aortic atherosclerosis (HCC) 06/15/2021   Generalized osteoarthritis of multiple sites  12/25/2020   Chronic pain disorder 12/25/2020   Pelvic mass in female    Bilateral lower extremity edema 04/13/2020   Shortness of breath    Anxiety disorder 10/29/2018   CKD (chronic kidney disease), stage III (HCC) 06/27/2018   COPD (chronic obstructive pulmonary disease) (HCC) 05/25/2018   Obesity 03/20/2018   Dyspnea on exertion 11/06/2017   Abnormal stress test 11/06/2017   Chronic heart failure with preserved ejection fraction (HCC) 09/04/2017   Left ventricular dysfunction 07/31/2017   Cataract, nuclear sclerotic senile, bilateral 02/01/2017   Gouty arthritis of toe of left foot 08/09/2016   Hyperuricemia 08/09/2016   Osteoarthritis (arthritis due to wear and tear of joints) 01/14/2014   Atherosclerosis of native coronary artery of native heart with angina pectoris (HCC) 08/02/2011   Mitral regurgitation 08/02/2011   OVERACTIVE BLADDER 07/02/2010   ACUTE CYSTITIS 05/25/2010   Mixed hyperlipidemia 08/11/2009   Leg edema, left 07/22/2009   GERD 04/11/2008   Constipation 04/11/2008   Essential hypertension 05/30/2007   MYOCARDIAL INFARCTION, HX OF 05/30/2007   Allergic rhinitis 05/30/2007   LOW BACK PAIN 05/30/2007    PCP: [place], [name], MD  REFERRING PROVIDER: [place], [name], MD  REFERRING DIAG: R29.6 (ICD-10-CM) - Frequent falls M54.50,G89.29 (ICD-10-CM) - Chronic low back pain without sciatica, unspecified back pain laterality  Rationale for Evaluation and Treatment: Rehabilitation  THERAPY DIAG:  Other low back pain  Repeated falls  Muscle weakness (generalized)  ONSET DATE: chronic  SUBJECTIVE:                                                                                                                                                                                           SUBJECTIVE STATEMENT: Patient reports that her back pain continues, especially with the recent rainy weather.   PERTINENT HISTORY:  Frequent falls Assessment & Plan: Another  fall a week ago, no serious injury. Fall precautions discussed. Last PT a year ago, she felt like it helped, new referral placed.   Orders: -     Ambulatory referral to Physical Therapy Chronic low back pain without sciatica, unspecified back pain laterality Assessment & Plan: No many options for pain management. Tramadol helped, so we decided to continue. PT will be arranged. Side effects discussed. PDMP reviewed. Will sign med contract next visit.   Orders: -     Ambulatory referral to Physical Therapy -     traMADol HCl; Take 0.5 tablets (25 mg total) by mouth every 12 (twelve) hours as needed.  Dispense: 30 tablet; Refill: 1 PAIN:  Are you having pain? Yes: NPRS scale: 5/10 Pain location: low back Pain description: ache Aggravating factors: prolonged sitting, bending over Relieving factors: pain patches,   PRECAUTIONS: Fall  RED FLAGS: None   WEIGHT BEARING RESTRICTIONS: No  FALLS:  Has patient fallen in last 6 months? Yes. Number of falls 1  LIVING ENVIRONMENT: Lives with: lives with their spouse Lives in: House/apartment Stairs:  avoids Has following equipment at home: Single point cane and Environmental consultant - 4 wheeled  OCCUPATION: retired  PLOF: Independent  PATIENT GOALS: To reduce my fall risk and manage my back pain  NEXT MD VISIT: PRN  OBJECTIVE:   DIAGNOSTIC FINDINGS:  none  PATIENT SURVEYS:  FOTO 19(39 predicted)  SCREENING FOR RED FLAGS: none  MUSCLE LENGTH: Hamstrings: WFL in sitting Thomas test: not tested  POSTURE: rounded shoulders, forward head, and increased lumbar lordosis  PALPATION: deferred  LUMBAR ROM: deferred due pain and balance deficits  AROM eval  Flexion   Extension   Right lateral flexion   Left lateral flexion   Right rotation   Left rotation    (Blank rows = not tested)  LOWER EXTREMITY ROM:   WFL for gait and transfers  Active  Right eval Left eval  Hip flexion    Hip extension    Hip abduction    Hip  adduction    Hip internal rotation    Hip external rotation    Knee flexion    Knee extension    Ankle dorsiflexion    Ankle plantarflexion    Ankle inversion    Ankle eversion     (Blank rows = not tested)  LOWER EXTREMITY MMT:    MMT Right eval Left eval  Hip flexion 3+ 3+  Hip extension 3+ 3+  Hip abduction 3+ 3+  Hip adduction    Hip internal rotation    Hip external rotation    Knee flexion 3+ 3+  Knee extension 3+ 3+  Ankle dorsiflexion    Ankle plantarflexion 4 4  Ankle inversion    Ankle eversion     (Blank rows = not tested)  LUMBAR SPECIAL TESTS:  Deferred 07/04/23 Slump test negative B  FUNCTIONAL TESTS:  30 seconds chair stand test 5 reps with BUE support mCTSIB position 4 <5s hold  GAIT: Distance walked: 82ft x2 Assistive device utilized: Walker - 4 wheeled Level of assistance: Modified independence Comments: slow cadence and flexed posture  TODAY'S TREATMENT:    [facility] Adult PT Treatment:                                                DATE: 07/12/23 Therapeutic Exercise: Nustep level 4  x 5 mins while gathering subjective info Sidestepping at countertop x3 laps Standing hip extension x10 BIL Heel raises at counter 2x10 WS onto 4 in block with OH reach 10/10 LAQs 2# 2x10 B Seated marching 2# 2x30" Supine march 10/10 x2 LTR B 30s x2 Supine hip fallouts RTB 15x B Bridge 10x    [facility] Adult PT Treatment:                                                DATE: 07/06/23 Therapeutic Exercise: LAQs 2# 2x10 B Supine march 10/10 x2 LTR B 30s x2 Supine hip fallouts RTB 15x B, 15/15 unilaterally Bridge 10x  WS onto 4 in block with OH reach 10/10  [facility] Adult [facility] Treatment:                                                DATE: 07/04/23  Neuromuscular re-ed: Tandem stance on 4 in step with OH reach 5/5 requiring CGA. Therapeutic Activity:   07/04/23 0001  Berg Balance Test  Sit to Stand 2  Standing Unsupported 2  Sitting with Back Unsupported but Feet  Supported on Floor or Stool 4  Stand to Sit 3  Transfers 3  Standing Unsupported with Eyes Closed 4  Standing Unsupported with Feet Together 3  From Standing, Reach Forward with Outstretched Arm 3  From Standing Position, Pick up Object from Floor 3  From Standing Position, Turn to Look Behind Over each Shoulder 4  Turn 360 Degrees 2  Standing Unsupported, Alternately Place Feet on Step/Stool 2  Standing Unsupported, One Foot in Front 2  Standing on One Leg 1  Total Score 38   2 MWT 131ft with rollator Standing heel/toe with UE support 10x focus on posture Sidestepping with BUE support 10/10                                                                                                                             PATIENT EDUCATION:  Education details: Discussed eval findings, rehab rationale and POC and patient is in agreement  Person educated: Patient Education method: Explanation Education comprehension: verbalized understanding and needs further education  HOME EXERCISE PROGRAM: Access Code: [email or web address] URL: [email or web address] Date: 06/27/2023 Prepared by: Gustavus Bryant  Exercises - Sit to Stand with Armchair  - 2 x daily - 5 x weekly - 1 sets - 5 reps - Heel Toe Raises with Counter Support  - 2 x daily - 5 x weekly - 2 sets - 10 reps - Standing [employer] at [employer]  - 2 x daily - 5 x weekly - 2 sets - 10 reps  ASSESSMENT:  CLINICAL IMPRESSION:  Patient presents to PT  reporting continued lower back pain that she states gets worse when the weather becomes rainy. Session today focused on proximal hip and LE strengthening with incorporation of more standing exercises today to moderate effect, she reports minimal increase in knee and lower back pain. Patient was able to tolerate all prescribed exercises with no adverse effects. Patient continues to benefit from skilled PT services and should be progressed as able to improve functional independence.     Patient is a 77 y.o. female who was seen today for physical therapy evaluation and treatment for low back pain and frequent falls. She presents with LE strength deficits as well as diminished balance in vision removed and compliant surface environments.  5x STS time demonstrates functional strength deficits and further balance testing needed to identify specific regions of balance dysfunction.  Suspected underlying BPPV may need to be addressed.    OBJECTIVE IMPAIRMENTS: Abnormal gait, decreased activity tolerance, decreased balance, decreased knowledge of condition, decreased mobility, difficulty walking, decreased strength, decreased safety awareness, dizziness, postural dysfunction, and obesity.   ACTIVITY LIMITATIONS: carrying, lifting, standing, stairs, and locomotion level  PERSONAL FACTORS: Age, Past/current experiences, and Time since onset of injury/illness/exacerbation are also affecting patient's functional outcome.   REHAB POTENTIAL: Good  CLINICAL DECISION MAKING: Evolving/moderate complexity  EVALUATION COMPLEXITY: Moderate   GOALS: Goals reviewed with patient? No  SHORT TERM GOALS: Target date: 07/25/2023  Patient to demonstrate independence in HEP  Baseline: [email or web address] Goal status: INITIAL  2.  Assess 2 MWT and establish baseline Baseline: TBD; 07/04/23 112ft with rollator Goal status: Met  3.  Increase reps to 7 on 30s chair stand test Baseline: 5 Goal status: INITIAL  LONG TERM GOALS: Target date: 08/22/2023  Increase B LE strength to 4-/5 Baseline:  MMT Right eval Left eval  Hip flexion 3+ 3+  Hip extension 3+ 3+  Hip abduction 3+ 3+  Hip adduction    Hip internal rotation    Hip external rotation    Knee flexion 3+ 3+  Knee extension 3+ 3+   Goal status: INITIAL  2.  Increase FOTO score to 39 Baseline: 19 Goal status: INITIAL  3.  Increase tim eon position 4 of mCTSIB to 20s Baseline: <5s Goal status: INITIAL  4.  Re-asess 2 MWT and note  gains Baseline: TBD; 07/04/23 14ft with rollator Goal status: INITIAL   PLAN:  PT FREQUENCY: 1-2x/week  PT DURATION: 6 weeks  PLANNED INTERVENTIONS: Therapeutic exercises, Therapeutic activity, Neuromuscular re-education, Balance training, Gait training, Patient/Family education, Self Care, Joint mobilization, Stair training, Vestibular training, Canalith repositioning, Visual/preceptual remediation/compensation, DME instructions, Dry Needling, Electrical stimulation, Spinal mobilization, Cryotherapy, Moist heat, Manual therapy, and Re-evaluation.  PLAN FOR NEXT SESSION: HEP review and update, manual techniques as appropriate, aerobic tasks, ROM and flexibility activities, strengthening and PREs, TPDN, gait and balance training as needed    Berta Minor, PTA 07/12/2023, 1:31 PM

## 2023-07-14 ENCOUNTER — Ambulatory Visit: Payer: Medicare Other

## 2023-07-14 DIAGNOSIS — M5459 Other low back pain: Secondary | ICD-10-CM

## 2023-07-14 DIAGNOSIS — R2681 Unsteadiness on feet: Secondary | ICD-10-CM | POA: Diagnosis not present

## 2023-07-14 DIAGNOSIS — R296 Repeated falls: Secondary | ICD-10-CM

## 2023-07-14 DIAGNOSIS — M6281 Muscle weakness (generalized): Secondary | ICD-10-CM

## 2023-07-14 DIAGNOSIS — M545 Low back pain, unspecified: Secondary | ICD-10-CM | POA: Diagnosis not present

## 2023-07-14 DIAGNOSIS — H8113 Benign paroxysmal vertigo, bilateral: Secondary | ICD-10-CM | POA: Diagnosis not present

## 2023-07-18 ENCOUNTER — Ambulatory Visit (INDEPENDENT_AMBULATORY_CARE_PROVIDER_SITE_OTHER): Payer: Medicare Other

## 2023-07-18 DIAGNOSIS — Z23 Encounter for immunization: Secondary | ICD-10-CM

## 2023-07-25 ENCOUNTER — Other Ambulatory Visit: Payer: Medicare Other

## 2023-07-26 ENCOUNTER — Ambulatory Visit: Payer: Medicare Other

## 2023-08-01 ENCOUNTER — Ambulatory Visit: Payer: Medicare Other | Attending: Family Medicine

## 2023-08-01 DIAGNOSIS — R296 Repeated falls: Secondary | ICD-10-CM | POA: Insufficient documentation

## 2023-08-01 DIAGNOSIS — M5459 Other low back pain: Secondary | ICD-10-CM

## 2023-08-01 DIAGNOSIS — M6281 Muscle weakness (generalized): Secondary | ICD-10-CM | POA: Diagnosis not present

## 2023-08-01 NOTE — Therapy (Signed)
OUTPATIENT PHYSICAL THERAPY TREATMENT NOTE   Patient Name: Tracy [name] MRN: [id number] DOB:19-Dec-1945, 77 y.o., female Today's Date: 08/01/2023  END OF SESSION:  PT End of Session - 08/01/23 1300     Visit Number 6    Number of Visits 12    Date for PT Re-Evaluation 08/27/23    Authorization Type MCR    PT Start Time 1312    PT Stop Time 1352    PT Time Calculation (min) 40 min    Activity Tolerance Patient tolerated treatment well    Behavior During Therapy WFL for tasks assessed/performed               Past Medical History:  Diagnosis Date   Allergic rhinitis    Allergy    Anxiety    Arthritis    Ascending aorta dilation (HCC) 09/13/2022   Echo 09/12/2022: EF 50-55, GR 1 DD, mildly reduced RVSF, normal PASP (RVSP 17.7), mild dilation of ascending aorta (38 mm), RAP 3   Breast cancer (HCC) 04/28/2020   rigtht breast   Bronchitis    CAD (coronary artery disease)    1/19 PCI/DES to pLCX for ISR, normal EF.    Cancer (HCC)    hx of precancerous cells in right breast    Cataract    Cervical dysplasia    unsure of procedure, possible "burning" in her late 75s   CHF (congestive heart failure) (HCC)    Echo 06/2019: EF 55-60, elevated LVEDP, normal RV SF, mild MAC, mild MR, trivial TR   Complication of anesthesia    COPD (chronic obstructive pulmonary disease) (HCC)    early    Depression    Dyspnea    with exertion    Family history of adverse reaction to anesthesia    daughter- problems wiht n/v   GERD (gastroesophageal reflux disease)    Gout    Headache(784.0)    Heart murmur    hx of years ago    Hyperlipidemia    Hypertension    Low back pain    Menopausal syndrome    Myocardial infarct (HCC) 2007   hx of   Overactive bladder    PONV (postoperative nausea and vomiting)    Sleep apnea    Past Surgical History:  Procedure Laterality Date   ANGIOPLASTY     stent 2007   BREAST LUMPECTOMY WITH RADIOACTIVE SEED LOCALIZATION Right 04/28/2020    Procedure: RIGHT BREAST LUMPECTOMY X 2  WITH RADIOACTIVE SEED LOCALIZATION;  Surgeon: Manus Rudd, MD;  Location: [facility];  Service: General;  Laterality: Right;  LMA   CARDIAC CATHETERIZATION     COLONOSCOPY     CORONARY STENT INTERVENTION N/A 11/06/2017   Procedure: CORONARY STENT INTERVENTION;  Surgeon: Yvonne Kendall, MD;  Location: [facility];  Service: Cardiovascular;  Laterality: N/A;   LEFT HEART CATH AND CORONARY ANGIOGRAPHY N/A 11/06/2017   Procedure: LEFT HEART CATH AND CORONARY ANGIOGRAPHY;  Surgeon: Yvonne Kendall, MD;  Location: [facility];  Service: Cardiovascular;  Laterality: N/A;   LEFT HEART CATH AND CORONARY ANGIOGRAPHY N/A 07/24/2019   Procedure: LEFT HEART CATH AND CORONARY ANGIOGRAPHY;  Surgeon: Kathleene Hazel, MD;  Location: [facility];  Service: Cardiovascular;  Laterality: N/A;   RIGHT/LEFT HEART CATH AND CORONARY ANGIOGRAPHY N/A 09/29/2020   Procedure: RIGHT/LEFT HEART CATH AND CORONARY ANGIOGRAPHY;  Surgeon: Lyn Records, MD;  Location: [facility];  Service: Cardiovascular;  Laterality: N/A;  ROBOTIC ASSISTED BILATERAL SALPINGO OOPHERECTOMY Bilateral 11/10/2020   Procedure: XI ROBOTIC ASSISTED BILATERAL SALPINGO OOPHORECTOMY WITH MINI LAPAROTOMY FOR DRAINAGE;  Surgeon: Carver Fila, MD;  Location: [facility];  Service: Gynecology;  Laterality: Bilateral;  MINI LAP FIRST   TONSILLECTOMY     VULVECTOMY N/A 11/10/2020   Procedure: WIDE EXCISION VULVECTOMY;  Surgeon: Carver Fila, MD;  Location: [facility];  Service: Gynecology;  Laterality: N/A;   Patient Active Problem List   Diagnosis Date Noted   Intertrigo 04/18/2023   Venous stasis dermatitis of left lower extremity 11/28/2022   Ascending aorta dilation (HCC) 09/13/2022   Dizziness 01/19/2022   Frequent falls 01/19/2022   Tremor of both hands 08/16/2021   Aortic atherosclerosis (HCC) 06/15/2021   Generalized osteoarthritis of multiple  sites 12/25/2020   Chronic pain disorder 12/25/2020   Pelvic mass in female    Bilateral lower extremity edema 04/13/2020   Shortness of breath    Anxiety disorder 10/29/2018   CKD (chronic kidney disease), stage III (HCC) 06/27/2018   COPD (chronic obstructive pulmonary disease) (HCC) 05/25/2018   Obesity 03/20/2018   Dyspnea on exertion 11/06/2017   Abnormal stress test 11/06/2017   Chronic heart failure with preserved ejection fraction (HCC) 09/04/2017   Left ventricular dysfunction 07/31/2017   Cataract, nuclear sclerotic senile, bilateral 02/01/2017   Gouty arthritis of toe of left foot 08/09/2016   Hyperuricemia 08/09/2016   Osteoarthritis (arthritis due to wear and tear of joints) 01/14/2014   Atherosclerosis of native coronary artery of native heart with angina pectoris (HCC) 08/02/2011   Mitral regurgitation 08/02/2011   OVERACTIVE BLADDER 07/02/2010   ACUTE CYSTITIS 05/25/2010   Mixed hyperlipidemia 08/11/2009   Leg edema, left 07/22/2009   GERD 04/11/2008   Constipation 04/11/2008   Essential hypertension 05/30/2007   MYOCARDIAL INFARCTION, HX OF 05/30/2007   Allergic rhinitis 05/30/2007   LOW BACK PAIN 05/30/2007    PCP: [place], [name], MD  REFERRING PROVIDER: [place], [name], MD  REFERRING DIAG: R29.6 (ICD-10-CM) - Frequent falls M54.50,G89.29 (ICD-10-CM) - Chronic low back pain without sciatica, unspecified back pain laterality  Rationale for Evaluation and Treatment: Rehabilitation  THERAPY DIAG:  Other low back pain  Muscle weakness (generalized)  ONSET DATE: chronic  SUBJECTIVE:                                                                                                                                                                                           SUBJECTIVE STATEMENT:  Pt presents to PT with reports of continued LBP. Has been fairly compliant with HEP.   PERTINENT HISTORY:  Frequent falls Assessment & Plan:  Another fall a week  ago, no serious injury. Fall precautions discussed. Last PT a year ago, she felt like it helped, new referral placed.   Orders: -     Ambulatory referral to Physical Therapy Chronic low back pain without sciatica, unspecified back pain laterality Assessment & Plan: No many options for pain management. Tramadol helped, so we decided to continue. PT will be arranged. Side effects discussed. PDMP reviewed. Will sign med contract next visit.   Orders: -     Ambulatory referral to Physical Therapy -     traMADol HCl; Take 0.5 tablets (25 mg total) by mouth every 12 (twelve) hours as needed.  Dispense: 30 tablet; Refill: 1 PAIN:  Are you having pain?  Yes: NPRS scale: 5/10 Pain location: low back Pain description: ache Aggravating factors: prolonged sitting, bending over Relieving factors: pain patches,   PRECAUTIONS: Fall  RED FLAGS: None   WEIGHT BEARING RESTRICTIONS: No  FALLS:  Has patient fallen in last 6 months? Yes. Number of falls 1  LIVING ENVIRONMENT: Lives with: lives with their spouse Lives in: House/apartment Stairs:  avoids Has following equipment at home: Single point cane and Environmental consultant - 4 wheeled  OCCUPATION: retired  PLOF: Independent  PATIENT GOALS: To reduce my fall risk and manage my back pain  NEXT MD VISIT: PRN  OBJECTIVE:   DIAGNOSTIC FINDINGS:  none  PATIENT SURVEYS:  FOTO 19(39 predicted)  SCREENING FOR RED FLAGS: none  MUSCLE LENGTH: Hamstrings: WFL in sitting Thomas test: not tested  POSTURE: rounded shoulders, forward head, and increased lumbar lordosis  PALPATION: deferred  LUMBAR ROM: deferred due pain and balance deficits  AROM eval  Flexion   Extension   Right lateral flexion   Left lateral flexion   Right rotation   Left rotation    (Blank rows = not tested)  LOWER EXTREMITY ROM:   WFL for gait and transfers  Active  Right eval Left eval  Hip flexion    Hip extension    Hip abduction    Hip adduction     Hip internal rotation    Hip external rotation    Knee flexion    Knee extension    Ankle dorsiflexion    Ankle plantarflexion    Ankle inversion    Ankle eversion     (Blank rows = not tested)  LOWER EXTREMITY MMT:    MMT Right eval Left eval  Hip flexion 3+ 3+  Hip extension 3+ 3+  Hip abduction 3+ 3+  Hip adduction    Hip internal rotation    Hip external rotation    Knee flexion 3+ 3+  Knee extension 3+ 3+  Ankle dorsiflexion    Ankle plantarflexion 4 4  Ankle inversion    Ankle eversion     (Blank rows = not tested)  LUMBAR SPECIAL TESTS:  Deferred 07/04/23 Slump test negative B  FUNCTIONAL TESTS:  30 seconds chair stand test 5 reps with BUE support mCTSIB position 4 <5s hold  GAIT: Distance walked: 60ft x2 Assistive device utilized: Walker - 4 wheeled Level of assistance: Modified independence Comments: slow cadence and flexed posture  TODAY'S TREATMENT:    [facility] Adult PT Treatment:                                                DATE: 08/01/23 Therapeutic Exercise:  Nustep L4 x 5 min while taking subjective Bridge x 10 Supine clamshell 2x10 GTB Supine march 2x20 GTB Supine ball squeeze 2x10  Supine QS x 10 each - 5" hold LAQ 2x10 2.5# Seated alt march 2.5# 2x30"  STS with eccentric control 2x5 Modalities: MHP to lumbar paraspinals during supine exercises  [facility] Adult PT Treatment:                                                DATE: 07/14/23 Therapeutic Exercise: Supine hip fallouts GTB 10x B, 10/10 unilaterally Supine march against GTB 10/10 with SKTC stretch by patient. Bridge against GTB 10x S/L clams GTB 10/10 Supine OH press with inspiration to promote thoracic mobility FAQs 3# on foot to promote DF, 2s hold 15x B Nustep L4 6 min 5x STS focus on eccentric component   [facility] Adult PT Treatment:                                                DATE: 07/12/23 Therapeutic Exercise: Nustep level 4 x 5 mins while gathering subjective  info Sidestepping at countertop x3 laps Standing hip extension x10 BIL Heel raises at counter 2x10 WS onto 4 in block with OH reach 10/10 LAQs 2# 2x10 B Seated marching 2# 2x30" Supine march 10/10 x2 LTR B 30s x2 Supine hip fallouts RTB 15x B Bridge 10x    PATIENT EDUCATION:  Education details: Discussed eval findings, rehab rationale and POC and patient is in agreement  Person educated: Patient Education method: Explanation Education comprehension: verbalized understanding and needs further education  HOME EXERCISE PROGRAM: Access Code: [email or web address] URL: [email or web address] Date: 06/27/2023 Prepared by: Gustavus Bryant  Exercises - Sit to Stand with Armchair  - 2 x daily - 5 x weekly - 1 sets - 5 reps - Heel Toe Raises with Counter Support  - 2 x daily - 5 x weekly - 2 sets - 10 reps - Standing [employer] at [employer]  - 2 x daily - 5 x weekly - 2 sets - 10 reps  ASSESSMENT:  CLINICAL IMPRESSION:  Pt was able to complete prescribed exercises with no adverse effect. Therapy focused on improving core and LE strength for improving mobility and decreasing pain. Will continue to progress as able per POC.   EVAL: Patient is a 77 y.o. female who was seen today for physical therapy evaluation and treatment for low back pain and frequent falls. She presents with LE strength deficits as well as diminished balance in vision removed and compliant surface environments.  5x STS time demonstrates functional strength deficits and further balance testing needed to identify specific regions of balance dysfunction.  Suspected underlying BPPV may need to be addressed.    OBJECTIVE IMPAIRMENTS: Abnormal gait, decreased activity tolerance, decreased balance, decreased knowledge of condition, decreased mobility, difficulty walking, decreased strength, decreased safety awareness, dizziness, postural dysfunction, and obesity.   ACTIVITY LIMITATIONS: carrying, lifting, standing, stairs,  and locomotion level  PERSONAL FACTORS: Age, Past/current experiences, and Time since onset of injury/illness/exacerbation are also affecting patient's functional outcome.   REHAB POTENTIAL: Good  CLINICAL DECISION MAKING: Evolving/moderate complexity  EVALUATION COMPLEXITY: Moderate   GOALS: Goals reviewed with patient? No  SHORT TERM GOALS: Target date: 07/25/2023  Patient to demonstrate independence in HEP  Baseline: [email or web address] Goal status: INITIAL  2.  Assess 2 MWT and establish baseline Baseline: TBD; 07/04/23 122ft with rollator Goal status: Met  3.  Increase reps to 7 on 30s chair stand test Baseline: 5 Goal status: INITIAL  LONG TERM GOALS: Target date: 08/22/2023  Increase B LE strength to 4-/5 Baseline:  MMT Right eval Left eval  Hip flexion 3+ 3+  Hip extension 3+ 3+  Hip abduction 3+ 3+  Hip adduction    Hip internal rotation    Hip external rotation    Knee flexion 3+ 3+  Knee extension 3+ 3+   Goal status: INITIAL  2.  Increase FOTO score to 39 Baseline: 19 Goal status: INITIAL  3.  Increase tim eon position 4 of mCTSIB to 20s Baseline: <5s Goal status: INITIAL  4.  Re-asess 2 MWT and note gains Baseline: TBD; 07/04/23 122ft with rollator Goal status: INITIAL   PLAN:  PT FREQUENCY: 1-2x/week  PT DURATION: 6 weeks  PLANNED INTERVENTIONS: Therapeutic exercises, Therapeutic activity, Neuromuscular re-education, Balance training, Gait training, Patient/Family education, Self Care, Joint mobilization, Stair training, Vestibular training, Canalith repositioning, Visual/preceptual remediation/compensation, DME instructions, Dry Needling, Electrical stimulation, Spinal mobilization, Cryotherapy, Moist heat, Manual therapy, and Re-evaluation.  PLAN FOR NEXT SESSION: HEP review and update, manual techniques as appropriate, aerobic tasks, ROM and flexibility activities, strengthening and PREs, TPDN, gait and balance training as needed    Eloy End, PT 08/01/2023, 1:59 PM

## 2023-08-03 NOTE — Therapy (Signed)
OUTPATIENT PHYSICAL THERAPY TREATMENT NOTE   Patient Name: Tracy [name] MRN: [id number] DOB:01/20/1946, 77 y.o., female Today's Date: 08/04/2023  END OF SESSION:  PT End of Session - 08/04/23 1258     Visit Number 7    Number of Visits 12    Date for PT Re-Evaluation 08/27/23    Authorization Type MCR    PT Start Time 1300    PT Stop Time 1345    PT Time Calculation (min) 45 min    Activity Tolerance Patient tolerated treatment well    Behavior During Therapy WFL for tasks assessed/performed                Past Medical History:  Diagnosis Date   Allergic rhinitis    Allergy    Anxiety    Arthritis    Ascending aorta dilation (HCC) 09/13/2022   Echo 09/12/2022: EF 50-55, GR 1 DD, mildly reduced RVSF, normal PASP (RVSP 17.7), mild dilation of ascending aorta (38 mm), RAP 3   Breast cancer (HCC) 04/28/2020   rigtht breast   Bronchitis    CAD (coronary artery disease)    1/19 PCI/DES to pLCX for ISR, normal EF.    Cancer (HCC)    hx of precancerous cells in right breast    Cataract    Cervical dysplasia    unsure of procedure, possible "burning" in her late 33s   CHF (congestive heart failure) (HCC)    Echo 06/2019: EF 55-60, elevated LVEDP, normal RV SF, mild MAC, mild MR, trivial TR   Complication of anesthesia    COPD (chronic obstructive pulmonary disease) (HCC)    early    Depression    Dyspnea    with exertion    Family history of adverse reaction to anesthesia    daughter- problems wiht n/v   GERD (gastroesophageal reflux disease)    Gout    Headache(784.0)    Heart murmur    hx of years ago    Hyperlipidemia    Hypertension    Low back pain    Menopausal syndrome    Myocardial infarct (HCC) 2007   hx of   Overactive bladder    PONV (postoperative nausea and vomiting)    Sleep apnea    Past Surgical History:  Procedure Laterality Date   ANGIOPLASTY     stent 2007   BREAST LUMPECTOMY WITH RADIOACTIVE SEED LOCALIZATION Right  04/28/2020   Procedure: RIGHT BREAST LUMPECTOMY X 2  WITH RADIOACTIVE SEED LOCALIZATION;  Surgeon: Manus Rudd, MD;  Location: [facility];  Service: General;  Laterality: Right;  LMA   CARDIAC CATHETERIZATION     COLONOSCOPY     CORONARY STENT INTERVENTION N/A 11/06/2017   Procedure: CORONARY STENT INTERVENTION;  Surgeon: Yvonne Kendall, MD;  Location: [facility];  Service: Cardiovascular;  Laterality: N/A;   LEFT HEART CATH AND CORONARY ANGIOGRAPHY N/A 11/06/2017   Procedure: LEFT HEART CATH AND CORONARY ANGIOGRAPHY;  Surgeon: Yvonne Kendall, MD;  Location: [facility];  Service: Cardiovascular;  Laterality: N/A;   LEFT HEART CATH AND CORONARY ANGIOGRAPHY N/A 07/24/2019   Procedure: LEFT HEART CATH AND CORONARY ANGIOGRAPHY;  Surgeon: Kathleene Hazel, MD;  Location: [facility];  Service: Cardiovascular;  Laterality: N/A;   RIGHT/LEFT HEART CATH AND CORONARY ANGIOGRAPHY N/A 09/29/2020   Procedure: RIGHT/LEFT HEART CATH AND CORONARY ANGIOGRAPHY;  Surgeon: Lyn Records, MD;  Location: [facility];  Service: Cardiovascular;  Laterality: N/A;  ROBOTIC ASSISTED BILATERAL SALPINGO OOPHERECTOMY Bilateral 11/10/2020   Procedure: XI ROBOTIC ASSISTED BILATERAL SALPINGO OOPHORECTOMY WITH MINI LAPAROTOMY FOR DRAINAGE;  Surgeon: Carver Fila, MD;  Location: [facility];  Service: Gynecology;  Laterality: Bilateral;  MINI LAP FIRST   TONSILLECTOMY     VULVECTOMY N/A 11/10/2020   Procedure: WIDE EXCISION VULVECTOMY;  Surgeon: Carver Fila, MD;  Location: [facility];  Service: Gynecology;  Laterality: N/A;   Patient Active Problem List   Diagnosis Date Noted   Intertrigo 04/18/2023   Venous stasis dermatitis of left lower extremity 11/28/2022   Ascending aorta dilation (HCC) 09/13/2022   Dizziness 01/19/2022   Frequent falls 01/19/2022   Tremor of both hands 08/16/2021   Aortic atherosclerosis (HCC) 06/15/2021   Generalized osteoarthritis of  multiple sites 12/25/2020   Chronic pain disorder 12/25/2020   Pelvic mass in female    Bilateral lower extremity edema 04/13/2020   Shortness of breath    Anxiety disorder 10/29/2018   CKD (chronic kidney disease), stage III (HCC) 06/27/2018   COPD (chronic obstructive pulmonary disease) (HCC) 05/25/2018   Obesity 03/20/2018   Dyspnea on exertion 11/06/2017   Abnormal stress test 11/06/2017   Chronic heart failure with preserved ejection fraction (HCC) 09/04/2017   Left ventricular dysfunction 07/31/2017   Cataract, nuclear sclerotic senile, bilateral 02/01/2017   Gouty arthritis of toe of left foot 08/09/2016   Hyperuricemia 08/09/2016   Osteoarthritis (arthritis due to wear and tear of joints) 01/14/2014   Atherosclerosis of native coronary artery of native heart with angina pectoris (HCC) 08/02/2011   Mitral regurgitation 08/02/2011   OVERACTIVE BLADDER 07/02/2010   ACUTE CYSTITIS 05/25/2010   Mixed hyperlipidemia 08/11/2009   Leg edema, left 07/22/2009   GERD 04/11/2008   Constipation 04/11/2008   Essential hypertension 05/30/2007   MYOCARDIAL INFARCTION, HX OF 05/30/2007   Allergic rhinitis 05/30/2007   LOW BACK PAIN 05/30/2007    PCP: [place], [name], MD  REFERRING PROVIDER: [place], [name], MD  REFERRING DIAG: R29.6 (ICD-10-CM) - Frequent falls M54.50,G89.29 (ICD-10-CM) - Chronic low back pain without sciatica, unspecified back pain laterality  Rationale for Evaluation and Treatment: Rehabilitation  THERAPY DIAG:  Other low back pain  Muscle weakness (generalized)  Repeated falls  ONSET DATE: chronic  SUBJECTIVE:                                                                                                                                                                                           SUBJECTIVE STATEMENT: Continued low back pain worse with cold weather.  Intensity average in 5/10.  B knee pain limits ability to perform strengthening  exercises  PERTINENT HISTORY:  Frequent falls Assessment & Plan: Another fall a week ago, no serious injury. Fall precautions discussed. Last PT a year ago, she felt like it helped, new referral placed.   Orders: -     Ambulatory referral to Physical Therapy Chronic low back pain without sciatica, unspecified back pain laterality Assessment & Plan: No many options for pain management. Tramadol helped, so we decided to continue. PT will be arranged. Side effects discussed. PDMP reviewed. Will sign med contract next visit.   Orders: -     Ambulatory referral to Physical Therapy -     traMADol HCl; Take 0.5 tablets (25 mg total) by mouth every 12 (twelve) hours as needed.  Dispense: 30 tablet; Refill: 1 PAIN:  Are you having pain?  Yes: NPRS scale: 5/10 Pain location: low back Pain description: ache Aggravating factors: prolonged sitting, bending over Relieving factors: pain patches,   PRECAUTIONS: Fall  RED FLAGS: None   WEIGHT BEARING RESTRICTIONS: No  FALLS:  Has patient fallen in last 6 months? Yes. Number of falls 1  LIVING ENVIRONMENT: Lives with: lives with their spouse Lives in: House/apartment Stairs:  avoids Has following equipment at home: Single point cane and Environmental consultant - 4 wheeled  OCCUPATION: retired  PLOF: Independent  PATIENT GOALS: To reduce my fall risk and manage my back pain  NEXT MD VISIT: PRN  OBJECTIVE:   DIAGNOSTIC FINDINGS:  none  PATIENT SURVEYS:  FOTO 19(39 predicted) ; 08/04/23 40  SCREENING FOR RED FLAGS: none  MUSCLE LENGTH: Hamstrings: WFL in sitting Thomas test: not tested  POSTURE: rounded shoulders, forward head, and increased lumbar lordosis  PALPATION: deferred  LUMBAR ROM: deferred due pain and balance deficits  AROM eval  Flexion   Extension   Right lateral flexion   Left lateral flexion   Right rotation   Left rotation    (Blank rows = not tested)  LOWER EXTREMITY ROM:   WFL for gait and  transfers  Active  Right eval Left eval  Hip flexion    Hip extension    Hip abduction    Hip adduction    Hip internal rotation    Hip external rotation    Knee flexion    Knee extension    Ankle dorsiflexion    Ankle plantarflexion    Ankle inversion    Ankle eversion     (Blank rows = not tested)  LOWER EXTREMITY MMT:    MMT Right eval Left eval  Hip flexion 3+ 3+  Hip extension 3+ 3+  Hip abduction 3+ 3+  Hip adduction    Hip internal rotation    Hip external rotation    Knee flexion 3+ 3+  Knee extension 3+ 3+  Ankle dorsiflexion    Ankle plantarflexion 4 4  Ankle inversion    Ankle eversion     (Blank rows = not tested)  LUMBAR SPECIAL TESTS:  Deferred 07/04/23 Slump test negative B  FUNCTIONAL TESTS:  30 seconds chair stand test 5 reps with BUE support 08/04/23 3 reps due to reports of knee pain/stiffness 08/04/23 2 MWT 151ft mCTSIB position 4 <5s hold 08/04/23 mCTSIB Able to hold all 4 positions for 30s under close S  GAIT: Distance walked: 45ft x2 Assistive device utilized: Environmental consultant - 4 wheeled Level of assistance: Modified independence Comments: slow cadence and flexed posture  TODAY'S TREATMENT:    [facility] Adult PT Treatment:  DATE: 08/04/23  Neuromuscular re-ed: Tandem stance on 4 in platform combining OH reach for balance and trunk strength 10x B, SBA needed Therapeutic Activity: 08/04/23 mCTSIB Able to hold all 4 positions for 30s under close S 08/04/23 30s chair stand 3 reps due to reports of knee pain/stiffness 08/04/23 2 MWT 156ft   [facility] Adult PT Treatment:                                                DATE: 08/01/23 Therapeutic Exercise: Nustep L4 x 5 min while taking subjective Bridge x 10 Supine clamshell 2x10 GTB Supine march 2x20 GTB Supine ball squeeze 2x10  Supine QS x 10 each - 5" hold LAQ 2x10 2.5# Seated alt march 2.5# 2x30"  STS with eccentric control 2x5 Modalities: MHP  to lumbar paraspinals during supine exercises  [facility] Adult PT Treatment:                                                DATE: 07/14/23 Therapeutic Exercise: Supine hip fallouts GTB 10x B, 10/10 unilaterally Supine march against GTB 10/10 with SKTC stretch by patient. Bridge against GTB 10x S/L clams GTB 10/10 Supine OH press with inspiration to promote thoracic mobility FAQs 3# on foot to promote DF, 2s hold 15x B Nustep L4 6 min 5x STS focus on eccentric component   [facility] Adult PT Treatment:                                                DATE: 07/12/23 Therapeutic Exercise: Nustep level 4 x 5 mins while gathering subjective info Sidestepping at countertop x3 laps Standing hip extension x10 BIL Heel raises at counter 2x10 WS onto 4 in block with OH reach 10/10 LAQs 2# 2x10 B Seated marching 2# 2x30" Supine march 10/10 x2 LTR B 30s x2 Supine hip fallouts RTB 15x B Bridge 10x    PATIENT EDUCATION:  Education details: Discussed eval findings, rehab rationale and POC and patient is in agreement  Person educated: Patient Education method: Explanation Education comprehension: verbalized understanding and needs further education  HOME EXERCISE PROGRAM: Access Code: [email or web address] URL: [email or web address] Date: 06/27/2023 Prepared by: Gustavus Bryant  Exercises - Sit to Stand with Armchair  - 2 x daily - 5 x weekly - 1 sets - 5 reps - Heel Toe Raises with Counter Support  - 2 x daily - 5 x weekly - 2 sets - 10 reps - Standing [employer] at [employer]  - 2 x daily - 5 x weekly - 2 sets - 10 reps  ASSESSMENT:  CLINICAL IMPRESSION: Cold weather increasing stiffness and B knee pain.  Despite issues patient able to increase distance on 2 MWT, complete all 4 positions on mCTSIB and meet her FOTO goal.  Discussed addition of aquatic sessions due to ongoing pain and stiffness to supplement land based PT.  Recommend continue OPPT to meet remaining goals and add aquatics as  needed.  EVAL: Patient is a 77 y.o. female who was seen today for physical therapy evaluation and treatment for low back pain and frequent falls.  She presents with LE strength deficits as well as diminished balance in vision removed and compliant surface environments.  5x STS time demonstrates functional strength deficits and further balance testing needed to identify specific regions of balance dysfunction.  Suspected underlying BPPV may need to be addressed.    OBJECTIVE IMPAIRMENTS: Abnormal gait, decreased activity tolerance, decreased balance, decreased knowledge of condition, decreased mobility, difficulty walking, decreased strength, decreased safety awareness, dizziness, postural dysfunction, and obesity.   ACTIVITY LIMITATIONS: carrying, lifting, standing, stairs, and locomotion level  PERSONAL FACTORS: Age, Past/current experiences, and Time since onset of injury/illness/exacerbation are also affecting patient's functional outcome.   REHAB POTENTIAL: Good  CLINICAL DECISION MAKING: Evolving/moderate complexity  EVALUATION COMPLEXITY: Moderate   GOALS: Goals reviewed with patient? No  SHORT TERM GOALS: Target date: 07/25/2023  Patient to demonstrate independence in HEP  Baseline: [email or web address] Goal status: Partially compliant  2.  Assess 2 MWT and establish baseline Baseline: TBD; 07/04/23 185ft with rollator Goal status: Met  3.  Increase reps to 7 on 30s chair stand test Baseline: 5; 08/04/23 3 due to B knee pain Goal status: Ongoing  LONG TERM GOALS: Target date: 08/22/2023  Increase B LE strength to 4-/5 Baseline:  MMT Right eval Left eval  Hip flexion 3+ 3+  Hip extension 3+ 3+  Hip abduction 3+ 3+  Hip adduction    Hip internal rotation    Hip external rotation    Knee flexion 3+ 3+  Knee extension 3+ 3+   Goal status: Ongoing  2.  Increase FOTO score to 39 Baseline: 19; 08/04/23 40 Goal status: Met  3.  Increase tim eon position 4 of mCTSIB to  20s Baseline: <5s; 08/04/23 mCTSIB Able to hold all 4 positions for 30s under close S Goal status: Met  4.  Re-asess 2 MWT and note gains Baseline: TBD; 07/04/23 158ft with rollator; 08/04/23 2 MWT 153ft Goal status: INITIAL   PLAN:  PT FREQUENCY: 1-2x/week  PT DURATION: 6 weeks  PLANNED INTERVENTIONS: Therapeutic exercises, Therapeutic activity, Neuromuscular re-education, Balance training, Gait training, Patient/Family education, Self Care, Joint mobilization, Stair training, Vestibular training, Canalith repositioning, Visual/preceptual remediation/compensation, DME instructions, Dry Needling, Electrical stimulation, Spinal mobilization, Cryotherapy, Moist heat, Manual therapy, and Re-evaluation.  PLAN FOR NEXT SESSION: HEP review and update, manual techniques as appropriate, aerobic tasks, ROM and flexibility activities, strengthening and PREs, TPDN, gait and balance training as needed    Hildred Laser, PT 08/04/2023, 1:03 PM

## 2023-08-04 ENCOUNTER — Ambulatory Visit: Payer: Medicare Other

## 2023-08-04 DIAGNOSIS — M6281 Muscle weakness (generalized): Secondary | ICD-10-CM | POA: Diagnosis not present

## 2023-08-04 DIAGNOSIS — R296 Repeated falls: Secondary | ICD-10-CM | POA: Diagnosis not present

## 2023-08-04 DIAGNOSIS — M5459 Other low back pain: Secondary | ICD-10-CM | POA: Diagnosis not present

## 2023-08-04 NOTE — Patient Instructions (Signed)
Aquatic Therapy at [facility]-  What to Expect!  Where:   [facility] [address] [place], [place] [address] Rehab phone [phone]  NOTE:  You will receive an automated phone message reminding you of your appt and it will say the appointment is at the [address] [facility] [address] [facility] clinic.          How to Prepare: Please make sure you drink 8 ounces of water about one hour prior to your pool session A caregiver may attend if needed with the patient to help assist as needed. A caregiver can sit in the pool room on chair. Please arrive IN YOUR SUIT and 15 minutes prior to your appointment - this helps to avoid delays in starting your session. Please make sure to attend to any toileting needs prior to entering the pool Locker rooms for changing are provided.   There is direct access to the pool deck form the locker room.  You can lock your belongings in a locker with lock provided. Once on the pool deck your therapist will ask if you have signed the Patient  Consent and Assignment of Benefits form before beginning treatment Your therapist may take your blood pressure prior to, during and after your session if indicated We usually try and create a home exercise program based on activities we do in the pool.  Please be thinking about who might be able to assist you in the pool should you need to participate in an aquatic home exercise program at the time of discharge if you need assistance.  Some patients do not want to or do not have the ability to participate in an aquatic home program - this is not a barrier in any way to you participating in aquatic therapy as part of your current therapy plan! After Discharge from PT, you can continue using home program at  the [facility]/, there is a drop-in fee for $5 ($45 a month)or for 60 years  or older $4.00 ($40 a month for seniors ) or any local [facility] pool.  Memberships for purchase are  available for gym/pool at [facility]  IT IS VERY IMPORTANT THAT YOUR LAST VISIT BE IN THE CLINIC AT [address] AFTER YOUR LAST AQUATIC VISIT.  PLEASE MAKE SURE THAT YOU HAVE A [address]  APPOINTMENT SCHEDULED.   About the pool: Pool is located approximately 500 FT from the entrance of the building.  Please bring a support person if you need assistance traveling this      distance.   Your therapist will assist you in entering the water; there are two ways to           enter: stairs with railings, and a mechanical lift. Your therapist will determine the most appropriate way for you.  Water temperature is usually between 88-90 degrees  There may be up to 2 other swimmers in the pool at the same time  The pool deck is tile, please wear shoes with good traction if you prefer not to be barefoot.    Contact Info:  For appointment scheduling and cancellations:         Please call the [facility]  PH:[phone]              Aquatic Therapy  [facility]       All sessions are 45 minutes                                                    

## 2023-08-07 ENCOUNTER — Encounter: Payer: Self-pay | Admitting: Family Medicine

## 2023-08-07 ENCOUNTER — Ambulatory Visit: Payer: Medicare Other | Admitting: Family Medicine

## 2023-08-07 VITALS — BP 160/90 | HR 100 | Temp 98.9°F | Resp 16 | Ht 65.0 in | Wt 215.0 lb

## 2023-08-07 DIAGNOSIS — E876 Hypokalemia: Secondary | ICD-10-CM | POA: Diagnosis not present

## 2023-08-07 DIAGNOSIS — G8929 Other chronic pain: Secondary | ICD-10-CM

## 2023-08-07 DIAGNOSIS — G894 Chronic pain syndrome: Secondary | ICD-10-CM

## 2023-08-07 DIAGNOSIS — K5900 Constipation, unspecified: Secondary | ICD-10-CM

## 2023-08-07 DIAGNOSIS — M545 Low back pain, unspecified: Secondary | ICD-10-CM

## 2023-08-07 DIAGNOSIS — I10 Essential (primary) hypertension: Secondary | ICD-10-CM | POA: Diagnosis not present

## 2023-08-07 DIAGNOSIS — I872 Venous insufficiency (chronic) (peripheral): Secondary | ICD-10-CM

## 2023-08-07 MED ORDER — TRIAMCINOLONE ACETONIDE 0.1 % EX CREA
TOPICAL_CREAM | Freq: Every day | CUTANEOUS | 1 refills | Status: DC | PRN
Start: 2023-08-07 — End: 2024-03-19

## 2023-08-07 MED ORDER — POTASSIUM CHLORIDE CRYS ER 10 MEQ PO TBCR: 10.0000 meq | EXTENDED_RELEASE_TABLET | Freq: Every day | ORAL | 0 refills | Status: DC

## 2023-08-07 NOTE — Assessment & Plan Note (Signed)
Tramadol 50 mg 1/2 to 1 tablet has helped with pain, she feels like the benefit is greater than the risk, she is not taking it very often concern about aggravating constipation. Fall precautions discussed.

## 2023-08-07 NOTE — Assessment & Plan Note (Signed)
We discussed some side effects of tramadol. She feels like tramadol is helping with her pain. Continue tramadol 50 mg 1/2 to 1 tablet daily as needed for back pain. Medication contract signed today. PDMP reviewed, she still has 1 refill available at her pharmacy.

## 2023-08-07 NOTE — Patient Instructions (Addendum)
A few things to remember from today's visit:  Essential hypertension  Constipation, unspecified constipation type  Chronic pain disorder  Chronic low back pain without sciatica, unspecified back pain laterality  Venous stasis dermatitis of both lower extremities - Plan: triamcinolone cream (KENALOG) 0.1 %  Please review med list and let us know is it is accurate. Monitor blood pressure at home. You have a Tramadol refill at your pharmacy. Keep appt with nephrologist and gynecologist.  Take Miralax when you take Tramadol.  If you need refills for medications you take chronically, please call your pharmacy. Do not use My Chart to request refills or for acute issues that need immediate attention. If you send a my chart message, it may take a few days to be addressed, specially if I am not in the office.  Please be sure medication list is accurate. If a new problem present, please set up appointment sooner than planned today.

## 2023-08-07 NOTE — Assessment & Plan Note (Signed)
She reports that problem has improved. Continue appropriate skin care and triamcinolone cream on affected areas daily as needed. Lower extremity elevation to help with edema will also help.

## 2023-08-07 NOTE — Assessment & Plan Note (Addendum)
BP today elevated, 160/90. She does not recall which medication she is taking. She is supposed to be on amlodipine 5 mg daily, spironolactone 25 mg one half daily, Imdur 30 mg daily, metoprolol succinate 100 mg daily.  Instructed to review medications at home and to let us know which medication she is not taking. Possible complications of elevated BP discussed. Low salt diet to continue. Recommend monitoring BP regularly, goal at least under 140/90. Instructed about warning signs. She has an appointment with nephrologist in a few weeks, so I will see her back in 5 months, before if needed.

## 2023-08-07 NOTE — Progress Notes (Signed)
HPI: Tracy [name] is a 77 y.o. female with a PMHx significant for HTN, MI, chronic heart failure with preserved ejection fraction, COPD, OA, CKD III, HLD, and chronic pain, among others, who is here today with her husband for chronic follow up.  Last seen on 06/06/2023  Chronic pain:  Generalized OA and back pain, which she states her pain is worsened with the colder weather.  She reports that tramadol really helps with pain. She is hesitant to take tramadol 50 mg because it makes her constipated. When she takes Miralax, she has diarrhea.  Her last bowel movement was this afternoon.  Negative for blood in the stool or melena.  She also has an appointment with an OB/GYN for persistent genital ulcer, not as tender as it was.  Frequent falls: She has had 1 fall since her last visit, she did roll out of her couch, she did not go to the ED. no worsening of musculoskeletal pain or changes in ROM. Now she is using a walker, which provide more stability with ambulation. She is currently doing PT.  Hypertension:  Medications: She is currently listed as taking amlodipine 5 mg daily, losartan 100 mg daily, and metoprolol succinate 100 mg daily. She is not listed as taking spironolactone. She expresses confusion about her medications.  BP readings at home: She is not currently checking her BP at home.  Her blood pressure is elevated today. She mentions she forgot to take her medicine this morning.  Since her last visit she has follow-up with her cardiologist, Dr. Excell Seltzer, for CAD and HFpEF.  Negative for unusual or severe headache, visual changes, exertional chest pain, worsening dyspnea,  focal weakness, or worsening edema.  CKD 3: Renal function improved. She has an appointment scheduled with nephrology.  Lab Results  Component Value Date   NA 140 06/06/2023   CL 110 06/06/2023   K 4.2 06/06/2023   CO2 24 06/06/2023   BUN 21 06/06/2023   CREATININE 1.30 (H) 06/06/2023   GFR  40.33 (L) 06/06/2023   CALCIUM 9.0 06/06/2023   ALBUMIN 3.0 (L) 10/11/2022   GLUCOSE 123 (H) 06/06/2023   Lower extremity edema and venous stasis dermatitis: She is reporting improvement, applying Vaseline on pretibial areas of lower extremities. Negative for pain or drainage. In the past she has been prescribed topical triamcinolone cream, she is not sure if she is using it.  Review of Systems  Constitutional:  Positive for fatigue. Negative for activity change, appetite change, chills and fever.  HENT:  Negative for mouth sores and sore throat.   Respiratory:  Negative for cough and wheezing.   Gastrointestinal:  Negative for abdominal pain, nausea and vomiting.  Genitourinary:  Negative for decreased urine volume, dysuria and hematuria.  Musculoskeletal:  Positive for arthralgias, back pain and gait problem.  Neurological:  Negative for syncope and facial asymmetry.  Psychiatric/Behavioral:  Negative for confusion and hallucinations.   See other pertinent positives and negatives in HPI.  Current Outpatient Medications on File Prior to Visit  Medication Sig Dispense Refill   acetaminophen (TYLENOL) 500 MG tablet Take 500-1,000 mg by mouth every 6 (six) hours as needed for headache.      allopurinol (ZYLOPRIM) 100 MG tablet TAKE 1 TABLET BY MOUTH EVERY DAY 90 tablet 2   amLODipine (NORVASC) 5 MG tablet TAKE 1 TABLET (5 MG TOTAL) BY MOUTH DAILY. 90 tablet 3   APPLE CIDER VINEGAR PO Take 2,400 mg by mouth daily.  ASPERCREME LIDOCAINE EX Apply 1 application topically daily as needed (pain).     aspirin EC 81 MG tablet Take 81 mg by mouth daily.     Cholecalciferol (VITAMIN D3) 50 MCG (2000 UT) TABS Take 4,000 Units by mouth daily.      clopidogrel (PLAVIX) 75 MG tablet TAKE 1 TABLET BY MOUTH EVERY DAY WITH BREAKFAST 90 tablet 2   Cyanocobalamin (VITAMIN B-12) 5000 MCG SUBL Place 5,000 mcg under the tongue daily.      ezetimibe (ZETIA) 10 MG tablet Take 1 tablet (10 mg total) by mouth  daily. 90 tablet 3   fluticasone (FLONASE) 50 MCG/ACT nasal spray SPRAY 2 SPRAYS INTO EACH NOSTRIL EVERY DAY 48 mL 1   guaiFENesin (MUCINEX) 600 MG 12 hr tablet Take 600 mg by mouth 2 (two) times daily as needed (congestion).     isosorbide mononitrate (IMDUR) 30 MG 24 hr tablet TAKE 1 TABLET BY MOUTH EVERY DAY 90 tablet 3   losartan (COZAAR) 100 MG tablet Take 1 tablet (100 mg total) by mouth daily. 90 tablet 3   meclizine (ANTIVERT) 25 MG tablet Take 1 tablet (25 mg total) by mouth every 6 (six) hours as needed for dizziness. 60 tablet 0   metoprolol succinate (TOPROL-XL) 100 MG 24 hr tablet TAKE 1 TABLET BY MOUTH EVERY DAY WITH OR IMMEDIATELY FOLLOWING A MEAL 90 tablet 3   Misc Natural Products (TART CHERRY ADVANCED) CAPS Take 1,000 capsules by mouth daily.      nitroGLYCERIN (NITROSTAT) 0.4 MG SL tablet PLACE 1 TABLET UNDER THE TONGUE EVERY 5 MINUTES AS NEEDED FOR CHEST PAIN 25 tablet 6   nystatin cream (MYCOSTATIN) Apply 1 Application topically 2 (two) times daily. 30 g 2   pantoprazole (PROTONIX) 40 MG tablet TAKE 1 TABLET BY MOUTH EVERY DAY 90 tablet 2   pravastatin (PRAVACHOL) 40 MG tablet TAKE 1 TABLET BY MOUTH EVERY DAY IN THE EVENING 90 tablet 3   Simethicone (GAS-X PO) Take 2 tablets by mouth daily as needed (gas).     spironolactone (ALDACTONE) 25 MG tablet Take 12.5 mg by mouth daily.     terconazole (TERAZOL 7) 0.4 % vaginal cream Place 1 applicator vaginally at bedtime. 45 g 0   traMADol (ULTRAM) 50 MG tablet Take 0.5 tablets (25 mg total) by mouth every 12 (twelve) hours as needed. 30 tablet 1   furosemide (LASIX) 40 MG tablet Take 40 mg by mouth daily. (Patient not taking: Reported on 08/07/2023)     No current facility-administered medications on file prior to visit.    Past Medical History:  Diagnosis Date   Allergic rhinitis    Allergy    Anxiety    Arthritis    Ascending aorta dilation (HCC) 09/13/2022   Echo 09/12/2022: EF 50-55, GR 1 DD, mildly reduced RVSF, normal  PASP (RVSP 17.7), mild dilation of ascending aorta (38 mm), RAP 3   Breast cancer (HCC) 04/28/2020   rigtht breast   Bronchitis    CAD (coronary artery disease)    1/19 PCI/DES to pLCX for ISR, normal EF.    Cancer (HCC)    hx of precancerous cells in right breast    Cataract    Cervical dysplasia    unsure of procedure, possible "burning" in her late 73s   CHF (congestive heart failure) (HCC)    Echo 06/2019: EF 55-60, elevated LVEDP, normal RV SF, mild MAC, mild MR, trivial TR   Complication of anesthesia    COPD (chronic obstructive  pulmonary disease) (HCC)    early    Depression    Dyspnea    with exertion    Family history of adverse reaction to anesthesia    daughter- problems wiht n/v   GERD (gastroesophageal reflux disease)    Gout    Headache(784.0)    Heart murmur    hx of years ago    Hyperlipidemia    Hypertension    Low back pain    Menopausal syndrome    Myocardial infarct (HCC) 2007   hx of   Overactive bladder    PONV (postoperative nausea and vomiting)    Sleep apnea    Allergies  Allergen Reactions   Erythromycin Rash    But isn't certain   Sulfamethoxazole Rash    Social History   Socioeconomic History   Marital status: Married    Spouse name: Not on file   Number of children: 2   Years of education: Not on file   Highest education level: 12th grade  Occupational History   Occupation: retired   Occupation: retired    Comment: Production designer, theatre/television/film  Tobacco Use   Smoking status: Former    Current packs/day: 0.00    Average packs/day: 1.3 packs/day for 52.0 years (65.0 ttl pk-yrs)    Types: Cigarettes    Start date: 1966    Quit date: 2018    Years since quitting: 6.8   Smokeless tobacco: Never   Tobacco comments:    completely quit May of 2018; period of years she did not smoke   Vaping Use   Vaping status: Never Used  Substance and Sexual Activity   Alcohol use: Yes    Comment: seldom   Drug use: No   Sexual activity: Not Currently   Other Topics Concern   Not on file  Social History Narrative   Not on file   Social Determinants of Health   Financial Resource Strain: Low Risk  (08/03/2023)   Overall Financial Resource Strain (CARDIA)    Difficulty of Paying Living Expenses: Not very hard  Food Insecurity: Food Insecurity Present (10/25/2022)   Hunger Vital Sign    Worried About Running Out of Food in the Last Year: Sometimes true    Ran Out of Food in the Last Year: Sometimes true  Transportation Needs: No Transportation Needs (08/03/2023)   PRAPARE - Administrator, Civil Service (Medical): No    Lack of Transportation (Non-Medical): No  Physical Activity: Unknown (08/03/2023)   Exercise Vital Sign    Days of Exercise per Week: 0 days    Minutes of Exercise per Session: Not on file  Stress: Stress Concern Present (08/03/2023)   [employer] of Occupational Health - Occupational Stress Questionnaire    Feeling of Stress : To some extent  Social Connections: Unknown (08/03/2023)   Social Connection and Isolation Panel [NHANES]    Frequency of Communication with Friends and Family: More than three times a week    Frequency of Social Gatherings with Friends and Family: Patient declined    Attends Religious Services: Patient declined    Active Member of Clubs or Organizations: No    Attends Banker Meetings: Not on file    Marital Status: Married    Vitals:   08/07/23 1544 08/07/23 1604  BP: (!) 170/60 (!) 160/90  Pulse: 100   Resp: 16   Temp: 98.9 F (37.2 C)   SpO2: 94%    Body mass index is 35.78 kg/m.  Physical Exam Vitals and nursing note reviewed.  Constitutional:      General: She is not in acute distress.    Appearance: She is well-developed.  HENT:     Head: Normocephalic and atraumatic.     Mouth/Throat:     Mouth: Mucous membranes are moist.  Eyes:     Conjunctiva/sclera: Conjunctivae normal.  Cardiovascular:     Rate and Rhythm: Normal rate and  regular rhythm.     Heart sounds: No murmur heard.    Comments: DP pulses palpable. Pitting LE edema,L>R. Pulmonary:     Effort: Pulmonary effort is normal. No respiratory distress.     Breath sounds: Normal breath sounds.  Abdominal:     Palpations: Abdomen is soft. There is no mass.     Tenderness: There is no abdominal tenderness.  Musculoskeletal:     Right shoulder: Decreased range of motion.     Thoracic back: No tenderness.     Lumbar back: No tenderness.     Right lower leg: 1+ Pitting Edema present.     Left lower leg: 1+ Pitting Edema present.  Skin:    General: Skin is warm.     Findings: Rash (Venous stasis dermatitis changes on pretibial areas, L>R.) present. No abrasion.  Neurological:     General: No focal deficit present.     Mental Status: She is alert.     Cranial Nerves: No cranial nerve deficit.     Gait: Gait normal.     Comments: Oriented in place and person. She does not remember today's date. Gait is unstable, assisted with a walker.  Psychiatric:        Mood and Affect: Affect normal. Mood is anxious.   ASSESSMENT AND PLAN:  Ms. Palu was seen today for chronic follow up. Chronic low back pain without sciatica, unspecified back pain laterality Assessment & Plan: Tramadol 50 mg 1/2 to 1 tablet has helped with pain, she feels like the benefit is greater than the risk, she is not taking it very often concern about aggravating constipation. Fall precautions discussed.   Constipation, unspecified constipation type Assessment & Plan: Tramadol can aggravate problem. Recommend taking OTC MiraLAX daily if she is going to continue taking tramadol. Adequate fiber and fluid intake. Last colonoscopy in 09/2021.   Essential hypertension Assessment & Plan: BP today elevated, 160/90. She does not recall which medication she is taking. She is supposed to be on amlodipine 5 mg daily, spironolactone 25 mg one half daily, Imdur 30 mg daily, metoprolol  succinate 100 mg daily.  Instructed to review medications at home and to let us know which medication she is not taking. Possible complications of elevated BP discussed. Low salt diet to continue. Recommend monitoring BP regularly, goal at least under 140/90. Instructed about warning signs. She has an appointment with nephrologist in a few weeks, so I will see her back in 5 months, before if needed.   Chronic pain disorder Assessment & Plan: We discussed some side effects of tramadol. She feels like tramadol is helping with her pain. Continue tramadol 50 mg 1/2 to 1 tablet daily as needed for back pain. Medication contract signed today. PDMP reviewed, she still has 1 refill available at her pharmacy.   Hypokalemia Assessment & Plan: She is not sure if she is taking spironolactone. She is currently on K-Lor 10 mK daily, no changes today, prescription sent. We discussed side effects of losartan and spironolactone. Last potassium was on 06/06/2023, 4.2.  Orders: -  Potassium Chloride Crys ER; Take 1 tablet (10 mEq total) by mouth daily.  Dispense: 90 tablet; Refill: 0  Venous stasis dermatitis of both lower extremities Assessment & Plan: She reports that problem has improved. Continue appropriate skin care and triamcinolone cream on affected areas daily as needed. Lower extremity elevation to help with edema will also help.  Orders: -     Triamcinolone Acetonide; Apply topically daily as needed (for leg dermatitis.).  Dispense: 30 g; Refill: 1   I spent a total of 44 minutes in both face to face and non face to face activities for this visit on the date of this encounter. During this time history was obtained and documented, examination was performed, prior labs reviewed, and assessment/plan discussed. We spent some time going through her medication list. Today she does not remember the date, her husband has not noted MS changes.  He will continue monitoring and will try to  manage her medications.  Return in about 5 months (around 01/05/2024) for chronic problems.  I, Rolla Etienne Wierda, acting as a scribe for Raneshia Derick [place], MD., have documented all relevant documentation on the behalf of Carynn Felling [place], MD, as directed by  Trinisha Paget [place], MD while in the presence of Tracy Smither [place], MD.   I, Zyniah Ferraiolo [place], MD, have reviewed all documentation for this visit. The documentation on 08/07/23 for the exam, diagnosis, procedures, and orders are all accurate and complete.  Fredrico Beedle G. [place], MD  [facility]. [place] office.

## 2023-08-07 NOTE — Assessment & Plan Note (Signed)
She is not sure if she is taking spironolactone. She is currently on K-Lor 10 mK daily, no changes today, prescription sent. We discussed side effects of losartan and spironolactone. Last potassium was on 06/06/2023, 4.2.

## 2023-08-07 NOTE — Assessment & Plan Note (Addendum)
Tramadol can aggravate problem. Recommend taking OTC MiraLAX daily if she is going to continue taking tramadol. Adequate fiber and fluid intake. Last colonoscopy in 09/2021.

## 2023-08-08 ENCOUNTER — Ambulatory Visit
Admission: RE | Admit: 2023-08-08 | Discharge: 2023-08-08 | Disposition: A | Discharge status: Home / Self Care | Payer: Medicare Other | Source: Ambulatory Visit | Attending: Family Medicine | Admitting: Family Medicine

## 2023-08-08 ENCOUNTER — Ambulatory Visit: Payer: Medicare Other

## 2023-08-08 DIAGNOSIS — M5459 Other low back pain: Secondary | ICD-10-CM

## 2023-08-08 DIAGNOSIS — N644 Mastodynia: Secondary | ICD-10-CM

## 2023-08-08 DIAGNOSIS — N6489 Other specified disorders of breast: Secondary | ICD-10-CM | POA: Diagnosis not present

## 2023-08-08 DIAGNOSIS — M6281 Muscle weakness (generalized): Secondary | ICD-10-CM | POA: Diagnosis not present

## 2023-08-08 DIAGNOSIS — R296 Repeated falls: Secondary | ICD-10-CM | POA: Diagnosis not present

## 2023-08-08 NOTE — Therapy (Signed)
OUTPATIENT PHYSICAL THERAPY TREATMENT NOTE   Patient Name: Tracy [name] MRN: [id number] DOB:1946/04/30, 77 y.o., female Today's Date: 08/08/2023  END OF SESSION:  PT End of Session - 08/08/23 1309     Visit Number 8    Number of Visits 12    Date for PT Re-Evaluation 08/27/23    Authorization Type MCR    PT Start Time 1315    PT Stop Time 1355    PT Time Calculation (min) 40 min    Activity Tolerance Patient tolerated treatment well    Behavior During Therapy [facility] for tasks assessed/performed                 Past Medical History:  Diagnosis Date   Allergic rhinitis    Allergy    Anxiety    Arthritis    Ascending aorta dilation (HCC) 09/13/2022   Echo 09/12/2022: EF 50-55, GR 1 DD, mildly reduced RVSF, normal PASP (RVSP 17.7), mild dilation of ascending aorta (38 mm), RAP 3   Breast cancer (HCC) 04/28/2020   rigtht breast   Bronchitis    CAD (coronary artery disease)    1/19 PCI/DES to pLCX for ISR, normal EF.    Cancer (HCC)    hx of precancerous cells in right breast    Cataract    Cervical dysplasia    unsure of procedure, possible "burning" in her late 67s   CHF (congestive heart failure) (HCC)    Echo 06/2019: EF 55-60, elevated LVEDP, normal RV SF, mild MAC, mild MR, trivial TR   Complication of anesthesia    COPD (chronic obstructive pulmonary disease) (HCC)    early    Depression    Dyspnea    with exertion    Family history of adverse reaction to anesthesia    daughter- problems wiht n/v   GERD (gastroesophageal reflux disease)    Gout    Headache(784.0)    Heart murmur    hx of years ago    Hyperlipidemia    Hypertension    Low back pain    Menopausal syndrome    Myocardial infarct (HCC) 2007   hx of   Overactive bladder    PONV (postoperative nausea and vomiting)    Sleep apnea    Past Surgical History:  Procedure Laterality Date   ANGIOPLASTY     stent 2007   BREAST EXCISIONAL BIOPSY Right 04/2020   ADH/LCIS   BREAST  LUMPECTOMY WITH RADIOACTIVE SEED LOCALIZATION Right 04/28/2020   Procedure: RIGHT BREAST LUMPECTOMY X 2  WITH RADIOACTIVE SEED LOCALIZATION;  Surgeon: Manus Rudd, MD;  Location: [facility];  Service: General;  Laterality: Right;  LMA   CARDIAC CATHETERIZATION     COLONOSCOPY     CORONARY STENT INTERVENTION N/A 11/06/2017   Procedure: CORONARY STENT INTERVENTION;  Surgeon: Yvonne Kendall, MD;  Location: [facility];  Service: Cardiovascular;  Laterality: N/A;   LEFT HEART CATH AND CORONARY ANGIOGRAPHY N/A 11/06/2017   Procedure: LEFT HEART CATH AND CORONARY ANGIOGRAPHY;  Surgeon: Yvonne Kendall, MD;  Location: [facility];  Service: Cardiovascular;  Laterality: N/A;   LEFT HEART CATH AND CORONARY ANGIOGRAPHY N/A 07/24/2019   Procedure: LEFT HEART CATH AND CORONARY ANGIOGRAPHY;  Surgeon: Kathleene Hazel, MD;  Location: [facility];  Service: Cardiovascular;  Laterality: N/A;   RIGHT/LEFT HEART CATH AND CORONARY ANGIOGRAPHY N/A 09/29/2020   Procedure: RIGHT/LEFT HEART CATH AND CORONARY ANGIOGRAPHY;  Surgeon: Lyn Records, MD;  Location:  [facility];  Service: Cardiovascular;  Laterality: N/A;   ROBOTIC ASSISTED BILATERAL SALPINGO OOPHERECTOMY Bilateral 11/10/2020   Procedure: XI ROBOTIC ASSISTED BILATERAL SALPINGO OOPHORECTOMY WITH MINI LAPAROTOMY FOR DRAINAGE;  Surgeon: Carver Fila, MD;  Location: [facility];  Service: Gynecology;  Laterality: Bilateral;  MINI LAP FIRST   TONSILLECTOMY     VULVECTOMY N/A 11/10/2020   Procedure: WIDE EXCISION VULVECTOMY;  Surgeon: Carver Fila, MD;  Location: [facility];  Service: Gynecology;  Laterality: N/A;   Patient Active Problem List   Diagnosis Date Noted   Hypokalemia 08/07/2023   Intertrigo 04/18/2023   Venous stasis dermatitis of both lower extremities 11/28/2022   Ascending aorta dilation (HCC) 09/13/2022   Dizziness 01/19/2022   Frequent falls 01/19/2022   Tremor of both hands  08/16/2021   Aortic atherosclerosis (HCC) 06/15/2021   Generalized osteoarthritis of multiple sites 12/25/2020   Chronic pain disorder 12/25/2020   Pelvic mass in female    Bilateral lower extremity edema 04/13/2020   Shortness of breath    Anxiety disorder 10/29/2018   CKD (chronic kidney disease), stage III (HCC) 06/27/2018   COPD (chronic obstructive pulmonary disease) (HCC) 05/25/2018   Obesity 03/20/2018   Dyspnea on exertion 11/06/2017   Abnormal stress test 11/06/2017   Chronic heart failure with preserved ejection fraction (HCC) 09/04/2017   Left ventricular dysfunction 07/31/2017   Cataract, nuclear sclerotic senile, bilateral 02/01/2017   Gouty arthritis of toe of left foot 08/09/2016   Hyperuricemia 08/09/2016   Osteoarthritis (arthritis due to wear and tear of joints) 01/14/2014   Atherosclerosis of native coronary artery of native heart with angina pectoris (HCC) 08/02/2011   Mitral regurgitation 08/02/2011   OVERACTIVE BLADDER 07/02/2010   ACUTE CYSTITIS 05/25/2010   Mixed hyperlipidemia 08/11/2009   Leg edema, left 07/22/2009   GERD 04/11/2008   Constipation 04/11/2008   Essential hypertension 05/30/2007   MYOCARDIAL INFARCTION, HX OF 05/30/2007   Allergic rhinitis 05/30/2007   LOW BACK PAIN 05/30/2007    PCP: [place], [name], MD  REFERRING PROVIDER: [place], [name], MD  REFERRING DIAG: R29.6 (ICD-10-CM) - Frequent falls M54.50,G89.29 (ICD-10-CM) - Chronic low back pain without sciatica, unspecified back pain laterality  Rationale for Evaluation and Treatment: Rehabilitation  THERAPY DIAG:  Other low back pain  Muscle weakness (generalized)  ONSET DATE: chronic  SUBJECTIVE:                                                                                                                                                                                           SUBJECTIVE STATEMENT:  Pt presents to PT with increased pain and fatigue.  PERTINENT HISTORY:   Frequent falls Assessment & Plan: Another fall a week ago, no serious injury. Fall precautions discussed. Last PT a year ago, she felt like it helped, new referral placed.   Orders: -     Ambulatory referral to Physical Therapy Chronic low back pain without sciatica, unspecified back pain laterality Assessment & Plan: No many options for pain management. Tramadol helped, so we decided to continue. PT will be arranged. Side effects discussed. PDMP reviewed. Will sign med contract next visit.   Orders: -     Ambulatory referral to Physical Therapy -     traMADol HCl; Take 0.5 tablets (25 mg total) by mouth every 12 (twelve) hours as needed.  Dispense: 30 tablet; Refill: 1 PAIN:  Are you having pain?  Yes: NPRS scale: 5/10 Pain location: low back Pain description: ache Aggravating factors: prolonged sitting, bending over Relieving factors: pain patches,   PRECAUTIONS: Fall  RED FLAGS: None   WEIGHT BEARING RESTRICTIONS: No  FALLS:  Has patient fallen in last 6 months? Yes. Number of falls 1  LIVING ENVIRONMENT: Lives with: lives with their spouse Lives in: House/apartment Stairs:  avoids Has following equipment at home: Single point cane and Environmental consultant - 4 wheeled  OCCUPATION: retired  PLOF: Independent  PATIENT GOALS: To reduce my fall risk and manage my back pain  NEXT MD VISIT: PRN  OBJECTIVE:   DIAGNOSTIC FINDINGS:  none  PATIENT SURVEYS:  FOTO 19(39 predicted) ; 08/04/23 40  SCREENING FOR RED FLAGS: none  MUSCLE LENGTH: Hamstrings: WFL in sitting Thomas test: not tested  POSTURE: rounded shoulders, forward head, and increased lumbar lordosis  PALPATION: deferred  LUMBAR ROM: deferred due pain and balance deficits  AROM eval  Flexion   Extension   Right lateral flexion   Left lateral flexion   Right rotation   Left rotation    (Blank rows = not tested)  LOWER EXTREMITY ROM:   WFL for gait and transfers  Active  Right eval Left eval   Hip flexion    Hip extension    Hip abduction    Hip adduction    Hip internal rotation    Hip external rotation    Knee flexion    Knee extension    Ankle dorsiflexion    Ankle plantarflexion    Ankle inversion    Ankle eversion     (Blank rows = not tested)  LOWER EXTREMITY MMT:    MMT Right eval Left eval  Hip flexion 3+ 3+  Hip extension 3+ 3+  Hip abduction 3+ 3+  Hip adduction    Hip internal rotation    Hip external rotation    Knee flexion 3+ 3+  Knee extension 3+ 3+  Ankle dorsiflexion    Ankle plantarflexion 4 4  Ankle inversion    Ankle eversion     (Blank rows = not tested)  LUMBAR SPECIAL TESTS:  Deferred 07/04/23 Slump test negative B  FUNCTIONAL TESTS:  30 seconds chair stand test 5 reps with BUE support 08/04/23 3 reps due to reports of knee pain/stiffness 08/04/23 2 MWT 144ft mCTSIB position 4 <5s hold 08/04/23 mCTSIB Able to hold all 4 positions for 30s under close S  GAIT: Distance walked: 25ft x2 Assistive device utilized: Environmental consultant - 4 wheeled Level of assistance: Modified independence Comments: slow cadence and flexed posture  TODAY'S TREATMENT:    [facility] Adult PT Treatment:  DATE: 08/08/23 Therapeutic Exercise: Nustep L5 UE/LE x 5 min while taking subjective LTR x 10 Supine clamshell 2x10 GTB Supine march 2x20 GTB Bridge 2x10 (small range) Supine ball squeeze 2x10  Supine QS x 10 each - 5" hold LAQ 2x15 Modalities: MHP to lumbar paraspinals during supine exercises  [facility] Adult PT Treatment:                                                DATE: 08/04/23 Neuromuscular re-ed: Tandem stance on 4 in platform combining OH reach for balance and trunk strength 10x B, SBA needed Therapeutic Activity: 08/04/23 mCTSIB Able to hold all 4 positions for 30s under close S 08/04/23 30s chair stand 3 reps due to reports of knee pain/stiffness 08/04/23 2 MWT 114ft  [facility] Adult PT Treatment:                                                 DATE: 08/01/23 Therapeutic Exercise: Nustep L4 x 5 min while taking subjective Bridge x 10 Supine clamshell 2x10 GTB Supine march 2x20 GTB Supine ball squeeze 2x10  Supine QS x 10 each - 5" hold LAQ 2x10 2.5# Seated alt march 2.5# 2x30"  STS with eccentric control 2x5 Modalities: MHP to lumbar paraspinals during supine exercises  [facility] Adult PT Treatment:                                                DATE: 07/14/23 Therapeutic Exercise: Supine hip fallouts GTB 10x B, 10/10 unilaterally Supine march against GTB 10/10 with SKTC stretch by patient. Bridge against GTB 10x S/L clams GTB 10/10 Supine OH press with inspiration to promote thoracic mobility FAQs 3# on foot to promote DF, 2s hold 15x B Nustep L4 6 min 5x STS focus on eccentric component   [facility] Adult PT Treatment:                                                DATE: 07/12/23 Therapeutic Exercise: Nustep level 4 x 5 mins while gathering subjective info Sidestepping at countertop x3 laps Standing hip extension x10 BIL Heel raises at counter 2x10 WS onto 4 in block with OH reach 10/10 LAQs 2# 2x10 B Seated marching 2# 2x30" Supine march 10/10 x2 LTR B 30s x2 Supine hip fallouts RTB 15x B Bridge 10x    PATIENT EDUCATION:  Education details: Discussed eval findings, rehab rationale and POC and patient is in agreement  Person educated: Patient Education method: Explanation Education comprehension: verbalized understanding and needs further education  HOME EXERCISE PROGRAM: Access Code: [email or web address] URL: [email or web address] Date: 06/27/2023 Prepared by: Gustavus Bryant  Exercises - Sit to Stand with Armchair  - 2 x daily - 5 x weekly - 1 sets - 5 reps - Heel Toe Raises with Counter Support  - 2 x daily - 5 x weekly - 2 sets - 10 reps - Standing [employer] at [employer]  - 2 x daily -  5 x weekly - 2 sets - 10 reps  ASSESSMENT:  CLINICAL IMPRESSION:  Pt was able to  complete prescribed exercises with no adverse effect. Therapy focused on improving core and LE strength for improving mobility and decreasing pain. Will continue to progress as able per POC.   EVAL: Patient is a 77 y.o. female who was seen today for physical therapy evaluation and treatment for low back pain and frequent falls. She presents with LE strength deficits as well as diminished balance in vision removed and compliant surface environments.  5x STS time demonstrates functional strength deficits and further balance testing needed to identify specific regions of balance dysfunction.  Suspected underlying BPPV may need to be addressed.    OBJECTIVE IMPAIRMENTS: Abnormal gait, decreased activity tolerance, decreased balance, decreased knowledge of condition, decreased mobility, difficulty walking, decreased strength, decreased safety awareness, dizziness, postural dysfunction, and obesity.   ACTIVITY LIMITATIONS: carrying, lifting, standing, stairs, and locomotion level  PERSONAL FACTORS: Age, Past/current experiences, and Time since onset of injury/illness/exacerbation are also affecting patient's functional outcome.   REHAB POTENTIAL: Good  CLINICAL DECISION MAKING: Evolving/moderate complexity  EVALUATION COMPLEXITY: Moderate   GOALS: Goals reviewed with patient? No  SHORT TERM GOALS: Target date: 07/25/2023  Patient to demonstrate independence in HEP  Baseline: [email or web address] Goal status: Partially compliant  2.  Assess 2 MWT and establish baseline Baseline: TBD; 07/04/23 161ft with rollator Goal status: Met  3.  Increase reps to 7 on 30s chair stand test Baseline: 5; 08/04/23 3 due to B knee pain Goal status: Ongoing  LONG TERM GOALS: Target date: 08/22/2023  Increase B LE strength to 4-/5 Baseline:  MMT Right eval Left eval  Hip flexion 3+ 3+  Hip extension 3+ 3+  Hip abduction 3+ 3+  Hip adduction    Hip internal rotation    Hip external rotation    Knee flexion 3+ 3+   Knee extension 3+ 3+   Goal status: Ongoing  2.  Increase FOTO score to 39 Baseline: 19; 08/04/23 40 Goal status: Met  3.  Increase tim eon position 4 of mCTSIB to 20s Baseline: <5s; 08/04/23 mCTSIB Able to hold all 4 positions for 30s under close S Goal status: Met  4.  Re-asess 2 MWT and note gains Baseline: TBD; 07/04/23 194ft with rollator; 08/04/23 2 MWT 134ft Goal status: INITIAL   PLAN:  PT FREQUENCY: 1-2x/week  PT DURATION: 6 weeks  PLANNED INTERVENTIONS: Therapeutic exercises, Therapeutic activity, Neuromuscular re-education, Balance training, Gait training, Patient/Family education, Self Care, Joint mobilization, Stair training, Vestibular training, Canalith repositioning, Visual/preceptual remediation/compensation, DME instructions, Dry Needling, Electrical stimulation, Spinal mobilization, Cryotherapy, Moist heat, Manual therapy, and Re-evaluation.  PLAN FOR NEXT SESSION: HEP review and update, manual techniques as appropriate, aerobic tasks, ROM and flexibility activities, strengthening and PREs, TPDN, gait and balance training as needed    Eloy End, PT 08/08/2023, 1:59 PM

## 2023-08-10 ENCOUNTER — Ambulatory Visit: Payer: Medicare Other

## 2023-08-10 DIAGNOSIS — M5459 Other low back pain: Secondary | ICD-10-CM | POA: Diagnosis not present

## 2023-08-10 DIAGNOSIS — M6281 Muscle weakness (generalized): Secondary | ICD-10-CM | POA: Diagnosis not present

## 2023-08-10 DIAGNOSIS — R296 Repeated falls: Secondary | ICD-10-CM | POA: Diagnosis not present

## 2023-08-10 NOTE — Therapy (Signed)
OUTPATIENT PHYSICAL THERAPY TREATMENT NOTE   Patient Name: Tracy [name] MRN: [id number] DOB:[birth year], 77 y.o., female Today's Date: 08/10/2023  END OF SESSION:  PT End of Session - 08/10/23 1349     Visit Number 9    Number of Visits 12    Date for PT Re-Evaluation 08/27/23    Authorization Type MCR    PT Start Time 1350    PT Stop Time 1430    PT Time Calculation (min) 40 min    Activity Tolerance Patient tolerated treatment well    Behavior During Therapy WFL for tasks assessed/performed                  Past Medical History:  Diagnosis Date   Allergic rhinitis    Allergy    Anxiety    Arthritis    Ascending aorta dilation (HCC) 09/13/2022   Echo 09/12/2022: EF 50-55, GR 1 DD, mildly reduced RVSF, normal PASP (RVSP 17.7), mild dilation of ascending aorta (38 mm), RAP 3   Breast cancer (HCC) 04/28/2020   rigtht breast   Bronchitis    CAD (coronary artery disease)    1/19 PCI/DES to pLCX for ISR, normal EF.    Cancer (HCC)    hx of precancerous cells in right breast    Cataract    Cervical dysplasia    unsure of procedure, possible "burning" in her late 37s   CHF (congestive heart failure) (HCC)    Echo 06/2019: EF 55-60, elevated LVEDP, normal RV SF, mild MAC, mild MR, trivial TR   Complication of anesthesia    COPD (chronic obstructive pulmonary disease) (HCC)    early    Depression    Dyspnea    with exertion    Family history of adverse reaction to anesthesia    daughter- problems wiht n/v   GERD (gastroesophageal reflux disease)    Gout    Headache(784.0)    Heart murmur    hx of years ago    Hyperlipidemia    Hypertension    Low back pain    Menopausal syndrome    Myocardial infarct (HCC) 2007   hx of   Overactive bladder    PONV (postoperative nausea and vomiting)    Sleep apnea    Past Surgical History:  Procedure Laterality Date   ANGIOPLASTY     stent 2007   BREAST EXCISIONAL BIOPSY Right 04/2020   ADH/LCIS   BREAST  LUMPECTOMY WITH RADIOACTIVE SEED LOCALIZATION Right 04/28/2020   Procedure: RIGHT BREAST LUMPECTOMY X 2  WITH RADIOACTIVE SEED LOCALIZATION;  Surgeon: Manus Rudd, MD;  Location: [facility];  Service: General;  Laterality: Right;  LMA   CARDIAC CATHETERIZATION     COLONOSCOPY     CORONARY STENT INTERVENTION N/A 11/06/2017   Procedure: CORONARY STENT INTERVENTION;  Surgeon: Yvonne Kendall, MD;  Location: [facility];  Service: Cardiovascular;  Laterality: N/A;   LEFT HEART CATH AND CORONARY ANGIOGRAPHY N/A 11/06/2017   Procedure: LEFT HEART CATH AND CORONARY ANGIOGRAPHY;  Surgeon: Yvonne Kendall, MD;  Location: [facility];  Service: Cardiovascular;  Laterality: N/A;   LEFT HEART CATH AND CORONARY ANGIOGRAPHY N/A 07/24/2019   Procedure: LEFT HEART CATH AND CORONARY ANGIOGRAPHY;  Surgeon: Kathleene Hazel, MD;  Location: [facility];  Service: Cardiovascular;  Laterality: N/A;   RIGHT/LEFT HEART CATH AND CORONARY ANGIOGRAPHY N/A 09/29/2020   Procedure: RIGHT/LEFT HEART CATH AND CORONARY ANGIOGRAPHY;  Surgeon: Lyn Records, MD;  Location: [facility];  Service: Cardiovascular;  Laterality: N/A;   ROBOTIC ASSISTED BILATERAL SALPINGO OOPHERECTOMY Bilateral 11/10/2020   Procedure: XI ROBOTIC ASSISTED BILATERAL SALPINGO OOPHORECTOMY WITH MINI LAPAROTOMY FOR DRAINAGE;  Surgeon: Carver Fila, MD;  Location: [facility];  Service: Gynecology;  Laterality: Bilateral;  MINI LAP FIRST   TONSILLECTOMY     VULVECTOMY N/A 11/10/2020   Procedure: WIDE EXCISION VULVECTOMY;  Surgeon: Carver Fila, MD;  Location: [facility];  Service: Gynecology;  Laterality: N/A;   Patient Active Problem List   Diagnosis Date Noted   Hypokalemia 08/07/2023   Intertrigo 04/18/2023   Venous stasis dermatitis of both lower extremities 11/28/2022   Ascending aorta dilation (HCC) 09/13/2022   Dizziness 01/19/2022   Frequent falls 01/19/2022   Tremor of both hands  08/16/2021   Aortic atherosclerosis (HCC) 06/15/2021   Generalized osteoarthritis of multiple sites 12/25/2020   Chronic pain disorder 12/25/2020   Pelvic mass in female    Bilateral lower extremity edema 04/13/2020   Shortness of breath    Anxiety disorder 10/29/2018   CKD (chronic kidney disease), stage III (HCC) 06/27/2018   COPD (chronic obstructive pulmonary disease) (HCC) 05/25/2018   Obesity 03/20/2018   Dyspnea on exertion 11/06/2017   Abnormal stress test 11/06/2017   Chronic heart failure with preserved ejection fraction (HCC) 09/04/2017   Left ventricular dysfunction 07/31/2017   Cataract, nuclear sclerotic senile, bilateral 02/01/2017   Gouty arthritis of toe of left foot 08/09/2016   Hyperuricemia 08/09/2016   Osteoarthritis (arthritis due to wear and tear of joints) 01/14/2014   Atherosclerosis of native coronary artery of native heart with angina pectoris (HCC) 08/02/2011   Mitral regurgitation 08/02/2011   OVERACTIVE BLADDER 07/02/2010   ACUTE CYSTITIS 05/25/2010   Mixed hyperlipidemia 08/11/2009   Leg edema, left 07/22/2009   GERD 04/11/2008   Constipation 04/11/2008   Essential hypertension 05/30/2007   MYOCARDIAL INFARCTION, HX OF 05/30/2007   Allergic rhinitis 05/30/2007   LOW BACK PAIN 05/30/2007    PCP: [place], [name], MD  REFERRING PROVIDER: [place], [name], MD  REFERRING DIAG: R29.6 (ICD-10-CM) - Frequent falls M54.50,G89.29 (ICD-10-CM) - Chronic low back pain without sciatica, unspecified back pain laterality  Rationale for Evaluation and Treatment: Rehabilitation  THERAPY DIAG:  Other low back pain  Muscle weakness (generalized)  ONSET DATE: chronic  SUBJECTIVE:                                                                                                                                                                                           SUBJECTIVE STATEMENT:  Pt presents to PT with continued pain and  lower back stiffness. Has been  compliant with HEP.   PERTINENT HISTORY:  Frequent falls Assessment & Plan: Another fall a week ago, no serious injury. Fall precautions discussed. Last PT a year ago, she felt like it helped, new referral placed.   Orders: -     Ambulatory referral to Physical Therapy Chronic low back pain without sciatica, unspecified back pain laterality Assessment & Plan: No many options for pain management. Tramadol helped, so we decided to continue. PT will be arranged. Side effects discussed. PDMP reviewed. Will sign med contract next visit.   Orders: -     Ambulatory referral to Physical Therapy -     traMADol HCl; Take 0.5 tablets (25 mg total) by mouth every 12 (twelve) hours as needed.  Dispense: 30 tablet; Refill: 1 PAIN:  Are you having pain?  Yes: NPRS scale: 5/10 Pain location: low back Pain description: ache Aggravating factors: prolonged sitting, bending over Relieving factors: pain patches,   PRECAUTIONS: Fall  RED FLAGS: None   WEIGHT BEARING RESTRICTIONS: No  FALLS:  Has patient fallen in last 6 months? Yes. Number of falls 1  LIVING ENVIRONMENT: Lives with: lives with their spouse Lives in: House/apartment Stairs:  avoids Has following equipment at home: Single point cane and Environmental consultant - 4 wheeled  OCCUPATION: retired  PLOF: Independent  PATIENT GOALS: To reduce my fall risk and manage my back pain  NEXT MD VISIT: PRN  OBJECTIVE:   DIAGNOSTIC FINDINGS:  none  PATIENT SURVEYS:  FOTO 19(39 predicted) ; 08/04/23 40  SCREENING FOR RED FLAGS: none  MUSCLE LENGTH: Hamstrings: WFL in sitting Thomas test: not tested  POSTURE: rounded shoulders, forward head, and increased lumbar lordosis  PALPATION: deferred  LUMBAR ROM: deferred due pain and balance deficits  AROM eval  Flexion   Extension   Right lateral flexion   Left lateral flexion   Right rotation   Left rotation    (Blank rows = not tested)  LOWER EXTREMITY ROM:   WFL for gait and  transfers  Active  Right eval Left eval  Hip flexion    Hip extension    Hip abduction    Hip adduction    Hip internal rotation    Hip external rotation    Knee flexion    Knee extension    Ankle dorsiflexion    Ankle plantarflexion    Ankle inversion    Ankle eversion     (Blank rows = not tested)  LOWER EXTREMITY MMT:    MMT Right eval Left eval  Hip flexion 3+ 3+  Hip extension 3+ 3+  Hip abduction 3+ 3+  Hip adduction    Hip internal rotation    Hip external rotation    Knee flexion 3+ 3+  Knee extension 3+ 3+  Ankle dorsiflexion    Ankle plantarflexion 4 4  Ankle inversion    Ankle eversion     (Blank rows = not tested)  LUMBAR SPECIAL TESTS:  Deferred 07/04/23 Slump test negative B  FUNCTIONAL TESTS:  30 seconds chair stand test 5 reps with BUE support 08/04/23 3 reps due to reports of knee pain/stiffness 08/04/23 2 MWT 123ft mCTSIB position 4 <5s hold 08/04/23 mCTSIB Able to hold all 4 positions for 30s under close S  GAIT: Distance walked: 7ft x2 Assistive device utilized: Environmental consultant - 4 wheeled Level of assistance: Modified independence Comments: slow cadence and flexed posture  TODAY'S TREATMENT:    [facility] Adult PT Treatment:  DATE: 08/10/23 Therapeutic Exercise: Nustep L5 UE/LE x 5 min while taking subjective LTR x 10 Supine clamshell 2x15 GTB Supine march 2x20 GTB Bridge 2x10 (small range) Supine ball squeeze 2x10  LAQ 2x15 STS with eccentric control 2x5 Modalities: MHP to lumbar paraspinals during supine exercises  [facility] Adult PT Treatment:                                                DATE: 08/08/23 Therapeutic Exercise: Nustep L5 UE/LE x 5 min while taking subjective LTR x 10 Supine clamshell 2x10 GTB Supine march 2x20 GTB Bridge 2x10 (small range) Supine ball squeeze 2x10  Supine QS x 10 each - 5" hold LAQ 2x15 Modalities: MHP to lumbar paraspinals during supine exercises  [facility]  Adult PT Treatment:                                                DATE: 08/04/23 Neuromuscular re-ed: Tandem stance on 4 in platform combining OH reach for balance and trunk strength 10x B, SBA needed Therapeutic Activity: 08/04/23 mCTSIB Able to hold all 4 positions for 30s under close S 08/04/23 30s chair stand 3 reps due to reports of knee pain/stiffness 08/04/23 2 MWT 122ft  [facility] Adult PT Treatment:                                                DATE: 08/01/23 Therapeutic Exercise: Nustep L4 x 5 min while taking subjective Bridge x 10 Supine clamshell 2x10 GTB Supine march 2x20 GTB Supine ball squeeze 2x10  Supine QS x 10 each - 5" hold LAQ 2x10 2.5# Seated alt march 2.5# 2x30"  STS with eccentric control 2x5 Modalities: MHP to lumbar paraspinals during supine exercises  [facility] Adult PT Treatment:                                                DATE: 07/14/23 Therapeutic Exercise: Supine hip fallouts GTB 10x B, 10/10 unilaterally Supine march against GTB 10/10 with SKTC stretch by patient. Bridge against GTB 10x S/L clams GTB 10/10 Supine OH press with inspiration to promote thoracic mobility FAQs 3# on foot to promote DF, 2s hold 15x B Nustep L4 6 min 5x STS focus on eccentric component   [facility] Adult PT Treatment:                                                DATE: 07/12/23 Therapeutic Exercise: Nustep level 4 x 5 mins while gathering subjective info Sidestepping at countertop x3 laps Standing hip extension x10 BIL Heel raises at counter 2x10 WS onto 4 in block with OH reach 10/10 LAQs 2# 2x10 B Seated marching 2# 2x30" Supine march 10/10 x2 LTR B 30s x2 Supine hip fallouts RTB 15x B Bridge 10x    PATIENT EDUCATION:  Education details: Discussed eval findings, rehab rationale and POC and patient is in agreement  Person educated: Patient Education method: Explanation Education comprehension: verbalized understanding and needs further education  HOME EXERCISE  PROGRAM: Access Code: [email or web address] URL: [email or web address] Date: 06/27/2023 Prepared by: Gustavus Bryant  Exercises - Sit to Stand with Armchair  - 2 x daily - 5 x weekly - 1 sets - 5 reps - Heel Toe Raises with Counter Support  - 2 x daily - 5 x weekly - 2 sets - 10 reps - Standing [employer] at [employer]  - 2 x daily - 5 x weekly - 2 sets - 10 reps  ASSESSMENT:  CLINICAL IMPRESSION:  Pt was able to complete prescribed exercises with no adverse effect. Therapy focused on improving core and LE strength for improving mobility and decreasing pain. Will continue to progress as able per POC.   EVAL: Patient is a 76 y.o. female who was seen today for physical therapy evaluation and treatment for low back pain and frequent falls. She presents with LE strength deficits as well as diminished balance in vision removed and compliant surface environments.  5x STS time demonstrates functional strength deficits and further balance testing needed to identify specific regions of balance dysfunction.  Suspected underlying BPPV may need to be addressed.    OBJECTIVE IMPAIRMENTS: Abnormal gait, decreased activity tolerance, decreased balance, decreased knowledge of condition, decreased mobility, difficulty walking, decreased strength, decreased safety awareness, dizziness, postural dysfunction, and obesity.   ACTIVITY LIMITATIONS: carrying, lifting, standing, stairs, and locomotion level  PERSONAL FACTORS: Age, Past/current experiences, and Time since onset of injury/illness/exacerbation are also affecting patient's functional outcome.   REHAB POTENTIAL: Good  CLINICAL DECISION MAKING: Evolving/moderate complexity  EVALUATION COMPLEXITY: Moderate   GOALS: Goals reviewed with patient? No  SHORT TERM GOALS: Target date: 07/25/2023  Patient to demonstrate independence in HEP  Baseline: [email or web address] Goal status: Partially compliant  2.  Assess 2 MWT and establish baseline Baseline: TBD;  07/04/23 169ft with rollator Goal status: Met  3.  Increase reps to 7 on 30s chair stand test Baseline: 5; 08/04/23 3 due to B knee pain Goal status: Ongoing  LONG TERM GOALS: Target date: 08/22/2023  Increase B LE strength to 4-/5 Baseline:  MMT Right eval Left eval  Hip flexion 3+ 3+  Hip extension 3+ 3+  Hip abduction 3+ 3+  Hip adduction    Hip internal rotation    Hip external rotation    Knee flexion 3+ 3+  Knee extension 3+ 3+   Goal status: Ongoing  2.  Increase FOTO score to 39 Baseline: 19; 08/04/23 40 Goal status: Met  3.  Increase tim eon position 4 of mCTSIB to 20s Baseline: <5s; 08/04/23 mCTSIB Able to hold all 4 positions for 30s under close S Goal status: Met  4.  Re-asess 2 MWT and note gains Baseline: TBD; 07/04/23 151ft with rollator; 08/04/23 2 MWT 176ft Goal status: INITIAL   PLAN:  PT FREQUENCY: 1-2x/week  PT DURATION: 6 weeks  PLANNED INTERVENTIONS: Therapeutic exercises, Therapeutic activity, Neuromuscular re-education, Balance training, Gait training, Patient/Family education, Self Care, Joint mobilization, Stair training, Vestibular training, Canalith repositioning, Visual/preceptual remediation/compensation, DME instructions, Dry Needling, Electrical stimulation, Spinal mobilization, Cryotherapy, Moist heat, Manual therapy, and Re-evaluation.  PLAN FOR NEXT SESSION: HEP review and update, manual techniques as appropriate, aerobic tasks, ROM and flexibility activities, strengthening and PREs, TPDN, gait and balance training as needed    Eloy End, PT 08/10/2023, 2:35 PM

## 2023-08-11 ENCOUNTER — Other Ambulatory Visit: Payer: Self-pay

## 2023-08-11 DIAGNOSIS — L988 Other specified disorders of the skin and subcutaneous tissue: Secondary | ICD-10-CM

## 2023-08-14 ENCOUNTER — Ambulatory Visit: Payer: Medicare Other

## 2023-08-14 DIAGNOSIS — M5459 Other low back pain: Secondary | ICD-10-CM | POA: Diagnosis not present

## 2023-08-14 DIAGNOSIS — R296 Repeated falls: Secondary | ICD-10-CM | POA: Diagnosis not present

## 2023-08-14 DIAGNOSIS — M6281 Muscle weakness (generalized): Secondary | ICD-10-CM

## 2023-08-14 NOTE — Therapy (Signed)
OUTPATIENT PHYSICAL THERAPY TREATMENT NOTE/PROGRESS NOTE   Patient Name: Tracy [name] MRN: [id number] DOB:September 18, 1946, 77 y.o., female Today's Date: 08/14/2023  END OF SESSION:  PT End of Session - 08/14/23 1304     Visit Number 10    Number of Visits 12    Date for PT Re-Evaluation 08/27/23    Authorization Type MCR    PT Start Time 1315    PT Stop Time 1400    PT Time Calculation (min) 45 min    Activity Tolerance Patient tolerated treatment well    Behavior During Therapy WFL for tasks assessed/performed                   Past Medical History:  Diagnosis Date   Allergic rhinitis    Allergy    Anxiety    Arthritis    Ascending aorta dilation (HCC) 09/13/2022   Echo 09/12/2022: EF 50-55, GR 1 DD, mildly reduced RVSF, normal PASP (RVSP 17.7), mild dilation of ascending aorta (38 mm), RAP 3   Breast cancer (HCC) 04/28/2020   rigtht breast   Bronchitis    CAD (coronary artery disease)    1/19 PCI/DES to pLCX for ISR, normal EF.    Cancer (HCC)    hx of precancerous cells in right breast    Cataract    Cervical dysplasia    unsure of procedure, possible "burning" in her late 14s   CHF (congestive heart failure) (HCC)    Echo 06/2019: EF 55-60, elevated LVEDP, normal RV SF, mild MAC, mild MR, trivial TR   Complication of anesthesia    COPD (chronic obstructive pulmonary disease) (HCC)    early    Depression    Dyspnea    with exertion    Family history of adverse reaction to anesthesia    daughter- problems wiht n/v   GERD (gastroesophageal reflux disease)    Gout    Headache(784.0)    Heart murmur    hx of years ago    Hyperlipidemia    Hypertension    Low back pain    Menopausal syndrome    Myocardial infarct (HCC) 2007   hx of   Overactive bladder    PONV (postoperative nausea and vomiting)    Sleep apnea    Past Surgical History:  Procedure Laterality Date   ANGIOPLASTY     stent 2007   BREAST EXCISIONAL BIOPSY Right 04/2020    ADH/LCIS   BREAST LUMPECTOMY WITH RADIOACTIVE SEED LOCALIZATION Right 04/28/2020   Procedure: RIGHT BREAST LUMPECTOMY X 2  WITH RADIOACTIVE SEED LOCALIZATION;  Surgeon: Manus Rudd, MD;  Location: [facility];  Service: General;  Laterality: Right;  LMA   CARDIAC CATHETERIZATION     COLONOSCOPY     CORONARY STENT INTERVENTION N/A 11/06/2017   Procedure: CORONARY STENT INTERVENTION;  Surgeon: Yvonne Kendall, MD;  Location: [facility];  Service: Cardiovascular;  Laterality: N/A;   LEFT HEART CATH AND CORONARY ANGIOGRAPHY N/A 11/06/2017   Procedure: LEFT HEART CATH AND CORONARY ANGIOGRAPHY;  Surgeon: Yvonne Kendall, MD;  Location: [facility];  Service: Cardiovascular;  Laterality: N/A;   LEFT HEART CATH AND CORONARY ANGIOGRAPHY N/A 07/24/2019   Procedure: LEFT HEART CATH AND CORONARY ANGIOGRAPHY;  Surgeon: Kathleene Hazel, MD;  Location: [facility];  Service: Cardiovascular;  Laterality: N/A;   RIGHT/LEFT HEART CATH AND CORONARY ANGIOGRAPHY N/A 09/29/2020   Procedure: RIGHT/LEFT HEART CATH AND CORONARY ANGIOGRAPHY;  Surgeon: Lyn Records,  MD;  Location: [facility];  Service: Cardiovascular;  Laterality: N/A;   ROBOTIC ASSISTED BILATERAL SALPINGO OOPHERECTOMY Bilateral 11/10/2020   Procedure: XI ROBOTIC ASSISTED BILATERAL SALPINGO OOPHORECTOMY WITH MINI LAPAROTOMY FOR DRAINAGE;  Surgeon: Carver Fila, MD;  Location: [facility];  Service: Gynecology;  Laterality: Bilateral;  MINI LAP FIRST   TONSILLECTOMY     VULVECTOMY N/A 11/10/2020   Procedure: WIDE EXCISION VULVECTOMY;  Surgeon: Carver Fila, MD;  Location: [facility];  Service: Gynecology;  Laterality: N/A;   Patient Active Problem List   Diagnosis Date Noted   Hypokalemia 08/07/2023   Intertrigo 04/18/2023   Venous stasis dermatitis of both lower extremities 11/28/2022   Ascending aorta dilation (HCC) 09/13/2022   Dizziness 01/19/2022   Frequent falls 01/19/2022   Tremor  of both hands 08/16/2021   Aortic atherosclerosis (HCC) 06/15/2021   Generalized osteoarthritis of multiple sites 12/25/2020   Chronic pain disorder 12/25/2020   Pelvic mass in female    Bilateral lower extremity edema 04/13/2020   Shortness of breath    Anxiety disorder 10/29/2018   CKD (chronic kidney disease), stage III (HCC) 06/27/2018   COPD (chronic obstructive pulmonary disease) (HCC) 05/25/2018   Obesity 03/20/2018   Dyspnea on exertion 11/06/2017   Abnormal stress test 11/06/2017   Chronic heart failure with preserved ejection fraction (HCC) 09/04/2017   Left ventricular dysfunction 07/31/2017   Cataract, nuclear sclerotic senile, bilateral 02/01/2017   Gouty arthritis of toe of left foot 08/09/2016   Hyperuricemia 08/09/2016   Osteoarthritis (arthritis due to wear and tear of joints) 01/14/2014   Atherosclerosis of native coronary artery of native heart with angina pectoris (HCC) 08/02/2011   Mitral regurgitation 08/02/2011   OVERACTIVE BLADDER 07/02/2010   ACUTE CYSTITIS 05/25/2010   Mixed hyperlipidemia 08/11/2009   Leg edema, left 07/22/2009   GERD 04/11/2008   Constipation 04/11/2008   Essential hypertension 05/30/2007   MYOCARDIAL INFARCTION, HX OF 05/30/2007   Allergic rhinitis 05/30/2007   LOW BACK PAIN 05/30/2007    PCP: [place], [name], MD  REFERRING PROVIDER: [place], [name], MD  REFERRING DIAG: R29.6 (ICD-10-CM) - Frequent falls M54.50,G89.29 (ICD-10-CM) - Chronic low back pain without sciatica, unspecified back pain laterality  Rationale for Evaluation and Treatment: Rehabilitation  THERAPY DIAG:  Other low back pain  Muscle weakness (generalized)  ONSET DATE: chronic  SUBJECTIVE:                                                                                                                                                                                           SUBJECTIVE STATEMENT:  Pt presents to PT with continued  pain and lower back  stiffness. Has been compliant with HEP.   PERTINENT HISTORY:  Frequent falls Assessment & Plan: Another fall a week ago, no serious injury. Fall precautions discussed. Last PT a year ago, she felt like it helped, new referral placed.   Orders: -     Ambulatory referral to Physical Therapy Chronic low back pain without sciatica, unspecified back pain laterality Assessment & Plan: No many options for pain management. Tramadol helped, so we decided to continue. PT will be arranged. Side effects discussed. PDMP reviewed. Will sign med contract next visit.   Orders: -     Ambulatory referral to Physical Therapy -     traMADol HCl; Take 0.5 tablets (25 mg total) by mouth every 12 (twelve) hours as needed.  Dispense: 30 tablet; Refill: 1 PAIN:  Are you having pain?  Yes: NPRS scale: 5/10 Pain location: low back Pain description: ache Aggravating factors: prolonged sitting, bending over Relieving factors: pain patches,   PRECAUTIONS: Fall  RED FLAGS: None   WEIGHT BEARING RESTRICTIONS: No  FALLS:  Has patient fallen in last 6 months? Yes. Number of falls 1  LIVING ENVIRONMENT: Lives with: lives with their spouse Lives in: House/apartment Stairs:  avoids Has following equipment at home: Single point cane and Environmental consultant - 4 wheeled  OCCUPATION: retired  PLOF: Independent  PATIENT GOALS: To reduce my fall risk and manage my back pain  NEXT MD VISIT: PRN  OBJECTIVE:   DIAGNOSTIC FINDINGS:  none  PATIENT SURVEYS:  FOTO 19(39 predicted) ; 08/04/23 40  SCREENING FOR RED FLAGS: none  MUSCLE LENGTH: Hamstrings: WFL in sitting Thomas test: not tested  POSTURE: rounded shoulders, forward head, and increased lumbar lordosis  PALPATION: deferred  LUMBAR ROM: deferred due pain and balance deficits  AROM eval  Flexion   Extension   Right lateral flexion   Left lateral flexion   Right rotation   Left rotation    (Blank rows = not tested)  LOWER EXTREMITY ROM:    WFL for gait and transfers  Active  Right eval Left eval  Hip flexion    Hip extension    Hip abduction    Hip adduction    Hip internal rotation    Hip external rotation    Knee flexion    Knee extension    Ankle dorsiflexion    Ankle plantarflexion    Ankle inversion    Ankle eversion     (Blank rows = not tested)  LOWER EXTREMITY MMT:    MMT Right eval Left eval Right 08/14/23 Left 08/14/23  Hip flexion 3+ 3+ 3+   Hip extension 3+ 3+ 4   Hip abduction 3+ 3+ 4   Hip adduction      Hip internal rotation      Hip external rotation      Knee flexion 3+ 3+ 4 4  Knee extension 3+ 3+ 4 4  Ankle dorsiflexion      Ankle plantarflexion 4 4    Ankle inversion      Ankle eversion       (Blank rows = not tested)  LUMBAR SPECIAL TESTS:  Deferred 07/04/23 Slump test negative B  FUNCTIONAL TESTS:  30 seconds chair stand test 5 reps with BUE support 08/04/23 3 reps due to reports of knee pain/stiffness 08/04/23 2 MWT 168ft mCTSIB position 4 <5s hold 08/04/23 mCTSIB Able to hold all 4 positions for 30s under close S  GAIT: Distance walked: 57ft x2 Assistive device  utilized: Environmental consultant - 4 wheeled Level of assistance: Modified independence Comments: slow cadence and flexed posture  TODAY'S TREATMENT:    [facility] Adult PT Treatment:                                                DATE: 08/14/23 Therapeutic Exercise: Nustep L5 UE/LE x 5 min while taking subjective Su[ine QS x 10 each Supine SLR x 10 each Supine ball squeeze 2x10 - 5" hold SAQ 2x15 2# Bridge 2x10 (small range, difficult) LTR x 10 Supine clamshell 2x15 blue band Supine march 2x20 blue band LAQ 2x15 Modalities: MHP to lumbar paraspinals during supine exercises  [facility] Adult PT Treatment:                                                DATE: 08/10/23 Therapeutic Exercise: Nustep L5 UE/LE x 5 min while taking subjective LTR x 10 Supine clamshell 2x15 GTB Supine march 2x20 GTB Bridge 2x10 (small  range) Supine ball squeeze 2x10  LAQ 2x15 STS with eccentric control 2x5 Modalities: MHP to lumbar paraspinals during supine exercises  [facility] Adult PT Treatment:                                                DATE: 08/08/23 Therapeutic Exercise: Nustep L5 UE/LE x 5 min while taking subjective LTR x 10 Supine clamshell 2x10 GTB Supine march 2x20 GTB Bridge 2x10 (small range) Supine ball squeeze 2x10  Supine QS x 10 each - 5" hold LAQ 2x15 Modalities: MHP to lumbar paraspinals during supine exercises  PATIENT EDUCATION:  Education details: Discussed eval findings, rehab rationale and POC and patient is in agreement  Person educated: Patient Education method: Explanation Education comprehension: verbalized understanding and needs further education  HOME EXERCISE PROGRAM: Access Code: [email or web address] URL: [email or web address] Date: 06/27/2023 Prepared by: Gustavus Bryant  Exercises - Sit to Stand with Armchair  - 2 x daily - 5 x weekly - 1 sets - 5 reps - Heel Toe Raises with Counter Support  - 2 x daily - 5 x weekly - 2 sets - 10 reps - Standing [employer] at [employer]  - 2 x daily - 5 x weekly - 2 sets - 10 reps  ASSESSMENT:  CLINICAL IMPRESSION:  Pt was able to complete prescribed exercises with no adverse effect. Therapy focused on improving core and LE strength for improving mobility and decreasing pain. Increased functional mobility noted with 30 Second Sit to Stand and improved MMT also recorded. Will continue to progress as able per POC.   EVAL: Patient is a 77 y.o. female who was seen today for physical therapy evaluation and treatment for low back pain and frequent falls. She presents with LE strength deficits as well as diminished balance in vision removed and compliant surface environments.  5x STS time demonstrates functional strength deficits and further balance testing needed to identify specific regions of balance dysfunction.  Suspected underlying BPPV  may need to be addressed.    OBJECTIVE IMPAIRMENTS: Abnormal gait, decreased activity tolerance, decreased balance, decreased knowledge of condition, decreased mobility, difficulty walking,  decreased strength, decreased safety awareness, dizziness, postural dysfunction, and obesity.   ACTIVITY LIMITATIONS: carrying, lifting, standing, stairs, and locomotion level  PERSONAL FACTORS: Age, Past/current experiences, and Time since onset of injury/illness/exacerbation are also affecting patient's functional outcome.   REHAB POTENTIAL: Good  CLINICAL DECISION MAKING: Evolving/moderate complexity  EVALUATION COMPLEXITY: Moderate   GOALS: Goals reviewed with patient? No  SHORT TERM GOALS: Target date: 07/25/2023  Patient to demonstrate independence in HEP  Baseline: [email or web address] Goal status: MET  2.  Assess 2 MWT and establish baseline Baseline: TBD; 07/04/23 138ft with rollator Goal status: MET  3.  Increase reps to 7 on 30s chair stand test Baseline: 5 reps 08/04/23: 3 due to B knee pain 08/14/23: 6 reps Goal status: IN PROGRESS  LONG TERM GOALS: Target date: 08/22/2023  Increase B LE strength to 4-/5 Baseline: See MMT chart Goal status: IN PROGRESS  2.  Increase FOTO score to 39 Baseline: 19;  08/04/23: 40 Goal status: MET  3.  Increase time on position 4 of mCTSIB to 20s Baseline: <5s; 08/04/23 mCTSIB Able to hold all 4 positions for 30s under close S Goal status: MET  4.  Re-asess 2 MWT and note gains Baseline: TBD; 07/04/23 132ft with rollator; 08/04/23 2 MWT 119ft Goal status: MET   PLAN:  PT FREQUENCY: 1-2x/week  PT DURATION: 6 weeks  PLANNED INTERVENTIONS: Therapeutic exercises, Therapeutic activity, Neuromuscular re-education, Balance training, Gait training, Patient/Family education, Self Care, Joint mobilization, Stair training, Vestibular training, Canalith repositioning, Visual/preceptual remediation/compensation, DME instructions, Dry Needling, Electrical  stimulation, Spinal mobilization, Cryotherapy, Moist heat, Manual therapy, and Re-evaluation.  PLAN FOR NEXT SESSION: HEP review and update, manual techniques as appropriate, aerobic tasks, ROM and flexibility activities, strengthening and PREs, TPDN, gait and balance training as needed    Eloy End, PT 08/14/2023, 2:25 PM

## 2023-08-16 ENCOUNTER — Ambulatory Visit: Payer: Medicare Other

## 2023-08-23 ENCOUNTER — Ambulatory Visit: Payer: Medicare Other | Attending: Family Medicine

## 2023-08-23 DIAGNOSIS — M6281 Muscle weakness (generalized): Secondary | ICD-10-CM | POA: Diagnosis not present

## 2023-08-23 DIAGNOSIS — R296 Repeated falls: Secondary | ICD-10-CM | POA: Diagnosis not present

## 2023-08-23 DIAGNOSIS — R2681 Unsteadiness on feet: Secondary | ICD-10-CM | POA: Diagnosis not present

## 2023-08-23 DIAGNOSIS — M5459 Other low back pain: Secondary | ICD-10-CM | POA: Insufficient documentation

## 2023-08-23 NOTE — Therapy (Signed)
OUTPATIENT PHYSICAL THERAPY TREATMENT NOTE   Patient Name: Tracy [name] MRN: [id number] DOB:Feb 12, 1946, 77 y.o., female Today's Date: 08/23/2023  END OF SESSION:  PT End of Session - 08/23/23 1300     Visit Number 11    Number of Visits 20    Date for PT Re-Evaluation 09/20/23    Authorization Type MCR    PT Start Time 1305    PT Stop Time 1345    PT Time Calculation (min) 40 min    Activity Tolerance Patient tolerated treatment well    Behavior During Therapy WFL for tasks assessed/performed                    Past Medical History:  Diagnosis Date   Allergic rhinitis    Allergy    Anxiety    Arthritis    Ascending aorta dilation (HCC) 09/13/2022   Echo 09/12/2022: EF 50-55, GR 1 DD, mildly reduced RVSF, normal PASP (RVSP 17.7), mild dilation of ascending aorta (38 mm), RAP 3   Breast cancer (HCC) 04/28/2020   rigtht breast   Bronchitis    CAD (coronary artery disease)    1/19 PCI/DES to pLCX for ISR, normal EF.    Cancer (HCC)    hx of precancerous cells in right breast    Cataract    Cervical dysplasia    unsure of procedure, possible "burning" in her late 18s   CHF (congestive heart failure) (HCC)    Echo 06/2019: EF 55-60, elevated LVEDP, normal RV SF, mild MAC, mild MR, trivial TR   Complication of anesthesia    COPD (chronic obstructive pulmonary disease) (HCC)    early    Depression    Dyspnea    with exertion    Family history of adverse reaction to anesthesia    daughter- problems wiht n/v   GERD (gastroesophageal reflux disease)    Gout    Headache(784.0)    Heart murmur    hx of years ago    Hyperlipidemia    Hypertension    Low back pain    Menopausal syndrome    Myocardial infarct (HCC) 2007   hx of   Overactive bladder    PONV (postoperative nausea and vomiting)    Sleep apnea    Past Surgical History:  Procedure Laterality Date   ANGIOPLASTY     stent 2007   BREAST EXCISIONAL BIOPSY Right 04/2020   ADH/LCIS    BREAST LUMPECTOMY WITH RADIOACTIVE SEED LOCALIZATION Right 04/28/2020   Procedure: RIGHT BREAST LUMPECTOMY X 2  WITH RADIOACTIVE SEED LOCALIZATION;  Surgeon: Manus Rudd, MD;  Location: [facility];  Service: General;  Laterality: Right;  LMA   CARDIAC CATHETERIZATION     COLONOSCOPY     CORONARY STENT INTERVENTION N/A 11/06/2017   Procedure: CORONARY STENT INTERVENTION;  Surgeon: Yvonne Kendall, MD;  Location: [facility];  Service: Cardiovascular;  Laterality: N/A;   LEFT HEART CATH AND CORONARY ANGIOGRAPHY N/A 11/06/2017   Procedure: LEFT HEART CATH AND CORONARY ANGIOGRAPHY;  Surgeon: Yvonne Kendall, MD;  Location: [facility];  Service: Cardiovascular;  Laterality: N/A;   LEFT HEART CATH AND CORONARY ANGIOGRAPHY N/A 07/24/2019   Procedure: LEFT HEART CATH AND CORONARY ANGIOGRAPHY;  Surgeon: Kathleene Hazel, MD;  Location: [facility];  Service: Cardiovascular;  Laterality: N/A;   RIGHT/LEFT HEART CATH AND CORONARY ANGIOGRAPHY N/A 09/29/2020   Procedure: RIGHT/LEFT HEART CATH AND CORONARY ANGIOGRAPHY;  Surgeon: Lyn Records,  MD;  Location: [facility];  Service: Cardiovascular;  Laterality: N/A;   ROBOTIC ASSISTED BILATERAL SALPINGO OOPHERECTOMY Bilateral 11/10/2020   Procedure: XI ROBOTIC ASSISTED BILATERAL SALPINGO OOPHORECTOMY WITH MINI LAPAROTOMY FOR DRAINAGE;  Surgeon: Carver Fila, MD;  Location: [facility];  Service: Gynecology;  Laterality: Bilateral;  MINI LAP FIRST   TONSILLECTOMY     VULVECTOMY N/A 11/10/2020   Procedure: WIDE EXCISION VULVECTOMY;  Surgeon: Carver Fila, MD;  Location: [facility];  Service: Gynecology;  Laterality: N/A;   Patient Active Problem List   Diagnosis Date Noted   Hypokalemia 08/07/2023   Intertrigo 04/18/2023   Venous stasis dermatitis of both lower extremities 11/28/2022   Ascending aorta dilation (HCC) 09/13/2022   Dizziness 01/19/2022   Frequent falls 01/19/2022   Tremor of both  hands 08/16/2021   Aortic atherosclerosis (HCC) 06/15/2021   Generalized osteoarthritis of multiple sites 12/25/2020   Chronic pain disorder 12/25/2020   Pelvic mass in female    Bilateral lower extremity edema 04/13/2020   Shortness of breath    Anxiety disorder 10/29/2018   CKD (chronic kidney disease), stage III (HCC) 06/27/2018   COPD (chronic obstructive pulmonary disease) (HCC) 05/25/2018   Obesity 03/20/2018   Dyspnea on exertion 11/06/2017   Abnormal stress test 11/06/2017   Chronic heart failure with preserved ejection fraction (HCC) 09/04/2017   Left ventricular dysfunction 07/31/2017   Cataract, nuclear sclerotic senile, bilateral 02/01/2017   Gouty arthritis of toe of left foot 08/09/2016   Hyperuricemia 08/09/2016   Osteoarthritis (arthritis due to wear and tear of joints) 01/14/2014   Atherosclerosis of native coronary artery of native heart with angina pectoris (HCC) 08/02/2011   Mitral regurgitation 08/02/2011   OVERACTIVE BLADDER 07/02/2010   ACUTE CYSTITIS 05/25/2010   Mixed hyperlipidemia 08/11/2009   Leg edema, left 07/22/2009   GERD 04/11/2008   Constipation 04/11/2008   Essential hypertension 05/30/2007   MYOCARDIAL INFARCTION, HX OF 05/30/2007   Allergic rhinitis 05/30/2007   LOW BACK PAIN 05/30/2007    PCP: [place], [name], MD  REFERRING PROVIDER: [place], [name], MD  REFERRING DIAG: R29.6 (ICD-10-CM) - Frequent falls M54.50,G89.29 (ICD-10-CM) - Chronic low back pain without sciatica, unspecified back pain laterality  Rationale for Evaluation and Treatment: Rehabilitation  THERAPY DIAG:  Other low back pain  Muscle weakness (generalized)  ONSET DATE: Chronic  SUBJECTIVE:                                                                                                                                                                                           SUBJECTIVE STATEMENT:  Pt presents to PT with reports  of increased LBP. She had difficulty  rising from her daughters toilet seat as it is very low.   PERTINENT HISTORY:  Frequent falls Assessment & Plan: Another fall a week ago, no serious injury. Fall precautions discussed. Last PT a year ago, she felt like it helped, new referral placed.   Orders: -     Ambulatory referral to Physical Therapy Chronic low back pain without sciatica, unspecified back pain laterality Assessment & Plan: No many options for pain management. Tramadol helped, so we decided to continue. PT will be arranged. Side effects discussed. PDMP reviewed. Will sign med contract next visit.   Orders: -     Ambulatory referral to Physical Therapy -     traMADol HCl; Take 0.5 tablets (25 mg total) by mouth every 12 (twelve) hours as needed.  Dispense: 30 tablet; Refill: 1 PAIN:  Are you having pain?  Yes: NPRS scale: 5/10 Pain location: low back Pain description: ache Aggravating factors: prolonged sitting, bending over Relieving factors: pain patches,   PRECAUTIONS: Fall  RED FLAGS: None   WEIGHT BEARING RESTRICTIONS: No  FALLS:  Has patient fallen in last 6 months? Yes. Number of falls 1  LIVING ENVIRONMENT: Lives with: lives with their spouse Lives in: House/apartment Stairs:  avoids Has following equipment at home: Single point cane and Environmental consultant - 4 wheeled  OCCUPATION: retired  PLOF: Independent  PATIENT GOALS: To reduce my fall risk and manage my back pain  NEXT MD VISIT: PRN  OBJECTIVE:   DIAGNOSTIC FINDINGS:  none  PATIENT SURVEYS:  FOTO 19(39 predicted) ; 08/04/23 40  SCREENING FOR RED FLAGS: none  MUSCLE LENGTH: Hamstrings: WFL in sitting Thomas test: not tested  POSTURE: rounded shoulders, forward head, and increased lumbar lordosis  PALPATION: deferred  LUMBAR ROM: deferred due pain and balance deficits  AROM eval  Flexion   Extension   Right lateral flexion   Left lateral flexion   Right rotation   Left rotation    (Blank rows = not  tested)  LOWER EXTREMITY ROM:   WFL for gait and transfers  Active  Right eval Left eval  Hip flexion    Hip extension    Hip abduction    Hip adduction    Hip internal rotation    Hip external rotation    Knee flexion    Knee extension    Ankle dorsiflexion    Ankle plantarflexion    Ankle inversion    Ankle eversion     (Blank rows = not tested)  LOWER EXTREMITY MMT:    MMT Right eval Left eval Right 08/14/23 Left 08/14/23  Hip flexion 3+ 3+ 3+   Hip extension 3+ 3+ 4   Hip abduction 3+ 3+ 4   Hip adduction      Hip internal rotation      Hip external rotation      Knee flexion 3+ 3+ 4 4  Knee extension 3+ 3+ 4 4  Ankle dorsiflexion      Ankle plantarflexion 4 4    Ankle inversion      Ankle eversion       (Blank rows = not tested)  LUMBAR SPECIAL TESTS:  Deferred 07/04/23 Slump test negative B  FUNCTIONAL TESTS:  30 seconds chair stand test 5 reps with BUE support 08/04/23 3 reps due to reports of knee pain/stiffness 08/04/23 2 MWT 150ft mCTSIB position 4 <5s hold 08/04/23 mCTSIB Able to hold all 4 positions for 30s under close S  GAIT: Distance walked: 38ft x2 Assistive device utilized: Environmental consultant - 4 wheeled Level of assistance: Modified independence Comments: slow cadence and flexed posture  TODAY'S TREATMENT:    [facility] Adult PT Treatment:                                                DATE: 08/23/23 Therapeutic Exercise: Nustep L5 UE/LE x 5 min while taking subjective Su[ine QS x 10 each Supine SLR 2x5 each Supine ball squeeze 2x10 - 5" hold Supine clamshell 2x15 blue band SAQ 2x15 2# LAQ 2x15 2# STS 2x5 - medium table, trying to minimize UE support Modalities: MHP to lumbar paraspinals during supine exercises  [facility] Adult PT Treatment:                                                DATE: 08/14/23 Therapeutic Exercise: Nustep L5 UE/LE x 5 min while taking subjective Su[ine QS x 10 each Supine SLR x 10 each Supine ball squeeze 2x10 - 5"  hold SAQ 2x15 2# Bridge 2x10 (small range, difficult) LTR x 10 Supine clamshell 2x15 blue band Supine march 2x20 blue band LAQ 2x15 Modalities: MHP to lumbar paraspinals during supine exercises  [facility] Adult PT Treatment:                                                DATE: 08/10/23 Therapeutic Exercise: Nustep L5 UE/LE x 5 min while taking subjective LTR x 10 Supine clamshell 2x15 GTB Supine march 2x20 GTB Bridge 2x10 (small range) Supine ball squeeze 2x10  LAQ 2x15 STS with eccentric control 2x5 Modalities: MHP to lumbar paraspinals during supine exercises  [facility] Adult PT Treatment:                                                DATE: 08/08/23 Therapeutic Exercise: Nustep L5 UE/LE x 5 min while taking subjective LTR x 10 Supine clamshell 2x10 GTB Supine march 2x20 GTB Bridge 2x10 (small range) Supine ball squeeze 2x10  Supine QS x 10 each - 5" hold LAQ 2x15 Modalities: MHP to lumbar paraspinals during supine exercises  PATIENT EDUCATION:  Education details: Discussed eval findings, rehab rationale and POC and patient is in agreement  Person educated: Patient Education method: Explanation Education comprehension: verbalized understanding and needs further education  HOME EXERCISE PROGRAM: Access Code: [email or web address] URL: [email or web address] Date: 06/27/2023 Prepared by: Gustavus Bryant  Exercises - Sit to Stand with Armchair  - 2 x daily - 5 x weekly - 1 sets - 5 reps - Heel Toe Raises with Counter Support  - 2 x daily - 5 x weekly - 2 sets - 10 reps - Standing [employer] at [employer]  - 2 x daily - 5 x weekly - 2 sets - 10 reps  ASSESSMENT:  CLINICAL IMPRESSION:  Pt was able to complete prescribed exercises with no adverse effect. Therapy focused on improving core and LE strength for improving mobility and decreasing pain.  She would benefit from short PT extension in POC as she continues to demo LE weakness and difficulty with rising form sit>stand,  especially in low seat surfaces.   EVAL: Patient is a 77 y.o. female who was seen today for physical therapy evaluation and treatment for low back pain and frequent falls. She presents with LE strength deficits as well as diminished balance in vision removed and compliant surface environments.  5x STS time demonstrates functional strength deficits and further balance testing needed to identify specific regions of balance dysfunction.  Suspected underlying BPPV may need to be addressed.    OBJECTIVE IMPAIRMENTS: Abnormal gait, decreased activity tolerance, decreased balance, decreased knowledge of condition, decreased mobility, difficulty walking, decreased strength, decreased safety awareness, dizziness, postural dysfunction, and obesity.   ACTIVITY LIMITATIONS: carrying, lifting, standing, stairs, and locomotion level  PERSONAL FACTORS: Age, Past/current experiences, and Time since onset of injury/illness/exacerbation are also affecting patient's functional outcome.   REHAB POTENTIAL: Good  CLINICAL DECISION MAKING: Evolving/moderate complexity  EVALUATION COMPLEXITY: Moderate   GOALS: Goals reviewed with patient? No  SHORT TERM GOALS: Target date: 07/25/2023  Patient to demonstrate independence in HEP  Baseline: [email or web address] Goal status: MET  2.  Assess 2 MWT and establish baseline Baseline: TBD; 07/04/23 153ft with rollator Goal status: MET  3.  Increase reps to 7 on 30s chair stand test Baseline: 5 reps 08/04/23: 3 due to B knee pain 08/14/23: 6 reps Goal status: IN PROGRESS  LONG TERM GOALS: Target date: 08/22/2023  Increase B LE strength to 4-/5 Baseline: See MMT chart Goal status: IN PROGRESS  2.  Increase FOTO score to 39 Baseline: 19;  08/04/23: 40 Goal status: MET  3.  Increase time on position 4 of mCTSIB to 20s Baseline: <5s; 08/04/23 mCTSIB Able to hold all 4 positions for 30s under close S Goal status: MET  4.  Re-asess 2 MWT and note gains Baseline: TBD;  07/04/23 155ft with rollator; 08/04/23 2 MWT 118ft Goal status: MET   PLAN:  PT FREQUENCY: 1-2x/week  PT DURATION: 6 weeks  PLANNED INTERVENTIONS: Therapeutic exercises, Therapeutic activity, Neuromuscular re-education, Balance training, Gait training, Patient/Family education, Self Care, Joint mobilization, Stair training, Vestibular training, Canalith repositioning, Visual/preceptual remediation/compensation, DME instructions, Dry Needling, Electrical stimulation, Spinal mobilization, Cryotherapy, Moist heat, Manual therapy, and Re-evaluation.  PLAN FOR NEXT SESSION: HEP review and update, manual techniques as appropriate, aerobic tasks, ROM and flexibility activities, strengthening and PREs, TPDN, gait and balance training as needed    Eloy End, PT 08/23/2023, 1:47 PM

## 2023-08-24 NOTE — Therapy (Addendum)
OUTPATIENT PHYSICAL THERAPY TREATMENT NOTE/RECERT   Patient Name: Tracy [name] MRN: [id number] DOB:12/06/1945, 77 y.o., female Today's Date: 08/25/2023  END OF SESSION:  PT End of Session - 08/25/23 1305     Visit Number 12    Number of Visits 20    Date for PT Re-Evaluation 09/20/23    Authorization Type MCR    PT Start Time 1300    PT Stop Time 1340    PT Time Calculation (min) 40 min    Activity Tolerance Patient tolerated treatment well    Behavior During Therapy WFL for tasks assessed/performed                     Past Medical History:  Diagnosis Date   Allergic rhinitis    Allergy    Anxiety    Arthritis    Ascending aorta dilation (HCC) 09/13/2022   Echo 09/12/2022: EF 50-55, GR 1 DD, mildly reduced RVSF, normal PASP (RVSP 17.7), mild dilation of ascending aorta (38 mm), RAP 3   Breast cancer (HCC) 04/28/2020   rigtht breast   Bronchitis    CAD (coronary artery disease)    1/19 PCI/DES to pLCX for ISR, normal EF.    Cancer (HCC)    hx of precancerous cells in right breast    Cataract    Cervical dysplasia    unsure of procedure, possible "burning" in her late 62s   CHF (congestive heart failure) (HCC)    Echo 06/2019: EF 55-60, elevated LVEDP, normal RV SF, mild MAC, mild MR, trivial TR   Complication of anesthesia    COPD (chronic obstructive pulmonary disease) (HCC)    early    Depression    Dyspnea    with exertion    Family history of adverse reaction to anesthesia    daughter- problems wiht n/v   GERD (gastroesophageal reflux disease)    Gout    Headache(784.0)    Heart murmur    hx of years ago    Hyperlipidemia    Hypertension    Low back pain    Menopausal syndrome    Myocardial infarct (HCC) 2007   hx of   Overactive bladder    PONV (postoperative nausea and vomiting)    Sleep apnea    Past Surgical History:  Procedure Laterality Date   ANGIOPLASTY     stent 2007   BREAST EXCISIONAL BIOPSY Right 04/2020    ADH/LCIS   BREAST LUMPECTOMY WITH RADIOACTIVE SEED LOCALIZATION Right 04/28/2020   Procedure: RIGHT BREAST LUMPECTOMY X 2  WITH RADIOACTIVE SEED LOCALIZATION;  Surgeon: Manus Rudd, MD;  Location: [facility];  Service: General;  Laterality: Right;  LMA   CARDIAC CATHETERIZATION     COLONOSCOPY     CORONARY STENT INTERVENTION N/A 11/06/2017   Procedure: CORONARY STENT INTERVENTION;  Surgeon: Yvonne Kendall, MD;  Location: [facility];  Service: Cardiovascular;  Laterality: N/A;   LEFT HEART CATH AND CORONARY ANGIOGRAPHY N/A 11/06/2017   Procedure: LEFT HEART CATH AND CORONARY ANGIOGRAPHY;  Surgeon: Yvonne Kendall, MD;  Location: [facility];  Service: Cardiovascular;  Laterality: N/A;   LEFT HEART CATH AND CORONARY ANGIOGRAPHY N/A 07/24/2019   Procedure: LEFT HEART CATH AND CORONARY ANGIOGRAPHY;  Surgeon: Kathleene Hazel, MD;  Location: [facility];  Service: Cardiovascular;  Laterality: N/A;   RIGHT/LEFT HEART CATH AND CORONARY ANGIOGRAPHY N/A 09/29/2020   Procedure: RIGHT/LEFT HEART CATH AND CORONARY ANGIOGRAPHY;  Surgeon: Verdis Prime  W, MD;  Location: [facility];  Service: Cardiovascular;  Laterality: N/A;   ROBOTIC ASSISTED BILATERAL SALPINGO OOPHERECTOMY Bilateral 11/10/2020   Procedure: XI ROBOTIC ASSISTED BILATERAL SALPINGO OOPHORECTOMY WITH MINI LAPAROTOMY FOR DRAINAGE;  Surgeon: Carver Fila, MD;  Location: [facility];  Service: Gynecology;  Laterality: Bilateral;  MINI LAP FIRST   TONSILLECTOMY     VULVECTOMY N/A 11/10/2020   Procedure: WIDE EXCISION VULVECTOMY;  Surgeon: Carver Fila, MD;  Location: [facility];  Service: Gynecology;  Laterality: N/A;   Patient Active Problem List   Diagnosis Date Noted   Hypokalemia 08/07/2023   Intertrigo 04/18/2023   Venous stasis dermatitis of both lower extremities 11/28/2022   Ascending aorta dilation (HCC) 09/13/2022   Dizziness 01/19/2022   Frequent falls 01/19/2022   Tremor  of both hands 08/16/2021   Aortic atherosclerosis (HCC) 06/15/2021   Generalized osteoarthritis of multiple sites 12/25/2020   Chronic pain disorder 12/25/2020   Pelvic mass in female    Bilateral lower extremity edema 04/13/2020   Shortness of breath    Anxiety disorder 10/29/2018   CKD (chronic kidney disease), stage III (HCC) 06/27/2018   COPD (chronic obstructive pulmonary disease) (HCC) 05/25/2018   Obesity 03/20/2018   Dyspnea on exertion 11/06/2017   Abnormal stress test 11/06/2017   Chronic heart failure with preserved ejection fraction (HCC) 09/04/2017   Left ventricular dysfunction 07/31/2017   Cataract, nuclear sclerotic senile, bilateral 02/01/2017   Gouty arthritis of toe of left foot 08/09/2016   Hyperuricemia 08/09/2016   Osteoarthritis (arthritis due to wear and tear of joints) 01/14/2014   Atherosclerosis of native coronary artery of native heart with angina pectoris (HCC) 08/02/2011   Mitral regurgitation 08/02/2011   OVERACTIVE BLADDER 07/02/2010   ACUTE CYSTITIS 05/25/2010   Mixed hyperlipidemia 08/11/2009   Leg edema, left 07/22/2009   GERD 04/11/2008   Constipation 04/11/2008   Essential hypertension 05/30/2007   MYOCARDIAL INFARCTION, HX OF 05/30/2007   Allergic rhinitis 05/30/2007   LOW BACK PAIN 05/30/2007    PCP: [place], [name], MD  REFERRING PROVIDER: [place], [name], MD  REFERRING DIAG: R29.6 (ICD-10-CM) - Frequent falls M54.50,G89.29 (ICD-10-CM) - Chronic low back pain without sciatica, unspecified back pain laterality  Rationale for Evaluation and Treatment: Rehabilitation  THERAPY DIAG:  Other low back pain  Muscle weakness (generalized)  Repeated falls  ONSET DATE: Chronic  SUBJECTIVE:                                                                                                                                                                                           SUBJECTIVE STATEMENT: Arrives to session  with increased R knee  pain.  Has had symptoms before, attempted to get knee injected but could not get an appointment until December 20.  She is agreeable to PT today.   PERTINENT HISTORY:  Frequent falls Assessment & Plan: Another fall a week ago, no serious injury. Fall precautions discussed. Last PT a year ago, she felt like it helped, new referral placed.   Orders: -     Ambulatory referral to Physical Therapy Chronic low back pain without sciatica, unspecified back pain laterality Assessment & Plan: No many options for pain management. Tramadol helped, so we decided to continue. PT will be arranged. Side effects discussed. PDMP reviewed. Will sign med contract next visit.   Orders: -     Ambulatory referral to Physical Therapy -     traMADol HCl; Take 0.5 tablets (25 mg total) by mouth every 12 (twelve) hours as needed.  Dispense: 30 tablet; Refill: 1 PAIN:  Are you having pain?  Yes: NPRS scale: 5/10 Pain location: low back Pain description: ache Aggravating factors: prolonged sitting, bending over Relieving factors: pain patches,   PRECAUTIONS: Fall  RED FLAGS: None   WEIGHT BEARING RESTRICTIONS: No  FALLS:  Has patient fallen in last 6 months? Yes. Number of falls 1  LIVING ENVIRONMENT: Lives with: lives with their spouse Lives in: House/apartment Stairs:  avoids Has following equipment at home: Single point cane and Environmental consultant - 4 wheeled  OCCUPATION: retired  PLOF: Independent  PATIENT GOALS: To reduce my fall risk and manage my back pain  NEXT MD VISIT: PRN  OBJECTIVE:   DIAGNOSTIC FINDINGS:  none  PATIENT SURVEYS:  FOTO 19(39 predicted) ; 08/04/23 40  SCREENING FOR RED FLAGS: none  MUSCLE LENGTH: Hamstrings: WFL in sitting Thomas test: not tested  POSTURE: rounded shoulders, forward head, and increased lumbar lordosis  PALPATION: deferred  LUMBAR ROM: deferred due pain and balance deficits  AROM eval  Flexion   Extension   Right lateral flexion    Left lateral flexion   Right rotation   Left rotation    (Blank rows = not tested)  LOWER EXTREMITY ROM:   WFL for gait and transfers  Active  Right eval Left eval  Hip flexion    Hip extension    Hip abduction    Hip adduction    Hip internal rotation    Hip external rotation    Knee flexion    Knee extension    Ankle dorsiflexion    Ankle plantarflexion    Ankle inversion    Ankle eversion     (Blank rows = not tested)  LOWER EXTREMITY MMT:    MMT Right eval Left eval Right 08/14/23 Left 08/14/23  Hip flexion 3+ 3+ 3+   Hip extension 3+ 3+ 4   Hip abduction 3+ 3+ 4   Hip adduction      Hip internal rotation      Hip external rotation      Knee flexion 3+ 3+ 4 4  Knee extension 3+ 3+ 4 4  Ankle dorsiflexion      Ankle plantarflexion 4 4    Ankle inversion      Ankle eversion       (Blank rows = not tested)  LUMBAR SPECIAL TESTS:  Deferred 07/04/23 Slump test negative B  FUNCTIONAL TESTS:  30 seconds chair stand test 5 reps with BUE support 08/04/23 3 reps due to reports of knee pain/stiffness 08/04/23 2 MWT 160ft mCTSIB position 4 <5s hold  08/04/23 mCTSIB Able to hold all 4 positions for 30s under close S  GAIT: Distance walked: 63ft x2 Assistive device utilized: Environmental consultant - 4 wheeled Level of assistance: Modified independence Comments: slow cadence and flexed posture  TODAY'S TREATMENT:    [facility] Adult PT Treatment:                                                DATE: 08/25/23 Therapeutic Exercise: Nustep L2 8 min Seated p-ball lumbar flexion 10x2 Seated latissimus pressdowns 2s x10 Seate lat pressdowns with alternating knee extension 10/10 Standing heel raises 10x Standing toe raises 10x  [facility] Adult PT Treatment:                                                DATE: 08/23/23 Therapeutic Exercise: Nustep L5 UE/LE x 5 min while taking subjective Su[ine QS x 10 each Supine SLR 2x5 each Supine ball squeeze 2x10 - 5" hold Supine clamshell 2x15 blue  band SAQ 2x15 2# LAQ 2x15 2# STS 2x5 - medium table, trying to minimize UE support Modalities: MHP to lumbar paraspinals during supine exercises  [facility] Adult PT Treatment:                                                DATE: 08/14/23 Therapeutic Exercise: Nustep L5 UE/LE x 5 min while taking subjective Su[ine QS x 10 each Supine SLR x 10 each Supine ball squeeze 2x10 - 5" hold SAQ 2x15 2# Bridge 2x10 (small range, difficult) LTR x 10 Supine clamshell 2x15 blue band Supine march 2x20 blue band LAQ 2x15 Modalities: MHP to lumbar paraspinals during supine exercises  [facility] Adult PT Treatment:                                                DATE: 08/10/23 Therapeutic Exercise: Nustep L5 UE/LE x 5 min while taking subjective LTR x 10 Supine clamshell 2x15 GTB Supine march 2x20 GTB Bridge 2x10 (small range) Supine ball squeeze 2x10  LAQ 2x15 STS with eccentric control 2x5 Modalities: MHP to lumbar paraspinals during supine exercises  [facility] Adult PT Treatment:                                                DATE: 08/08/23 Therapeutic Exercise: Nustep L5 UE/LE x 5 min while taking subjective LTR x 10 Supine clamshell 2x10 GTB Supine march 2x20 GTB Bridge 2x10 (small range) Supine ball squeeze 2x10  Supine QS x 10 each - 5" hold LAQ 2x15 Modalities: MHP to lumbar paraspinals during supine exercises  PATIENT EDUCATION:  Education details: Discussed eval findings, rehab rationale and POC and patient is in agreement  Person educated: Patient Education method: Explanation Education comprehension: verbalized understanding and needs further education  HOME EXERCISE PROGRAM: Access Code: [email or web address] URL: [email or web address] Date: 06/27/2023 Prepared  by: Gustavus Bryant  Exercises - Sit to Stand with Armchair  - 2 x daily - 5 x weekly - 1 sets - 5 reps - Heel Toe Raises with Counter Support  - 2 x daily - 5 x weekly - 2 sets - 10 reps - Standing [employer]  at [employer]  - 2 x daily - 5 x weekly - 2 sets - 10 reps  ASSESSMENT:  CLINICAL IMPRESSION: Todays session accommodated R knee pain.  Focus was core strength and functional exercises in seated position.  Able to ride Nustep and participate in ankle tasks w/o symptom aggravation  EVAL: Patient is a 77 y.o. female who was seen today for physical therapy evaluation and treatment for low back pain and frequent falls. She presents with LE strength deficits as well as diminished balance in vision removed and compliant surface environments.  5x STS time demonstrates functional strength deficits and further balance testing needed to identify specific regions of balance dysfunction.  Suspected underlying BPPV may need to be addressed.    OBJECTIVE IMPAIRMENTS: Abnormal gait, decreased activity tolerance, decreased balance, decreased knowledge of condition, decreased mobility, difficulty walking, decreased strength, decreased safety awareness, dizziness, postural dysfunction, and obesity.   ACTIVITY LIMITATIONS: carrying, lifting, standing, stairs, and locomotion level  PERSONAL FACTORS: Age, Past/current experiences, and Time since onset of injury/illness/exacerbation are also affecting patient's functional outcome.   REHAB POTENTIAL: Good  CLINICAL DECISION MAKING: Evolving/moderate complexity  EVALUATION COMPLEXITY: Moderate   GOALS: Goals reviewed with patient? No  SHORT TERM GOALS: Target date: 07/25/2023  Patient to demonstrate independence in HEP  Baseline: [email or web address] Goal status: MET  2.  Assess 2 MWT and establish baseline Baseline: TBD; 07/04/23 196ft with rollator Goal status: MET  3.  Increase reps to 7 on 30s chair stand test Baseline: 5 reps 08/04/23: 3 due to B knee pain 08/14/23: 6 reps Goal status: IN PROGRESS  LONG TERM GOALS: Target date: 08/22/2023  Increase B LE strength to 4-/5 Baseline: See MMT chart Goal status: IN PROGRESS  2.  Increase FOTO score to 39 Baseline:  19;  08/04/23: 40 Goal status: MET  3.  Increase time on position 4 of mCTSIB to 20s Baseline: <5s; 08/04/23 mCTSIB Able to hold all 4 positions for 30s under close S Goal status: MET  4.  Re-asess 2 MWT and note gains Baseline: TBD; 07/04/23 122ft with rollator; 08/04/23 2 MWT 134ft Goal status: MET   PLAN:  PT FREQUENCY: 1-2x/week  PT DURATION: 6 weeks  PLANNED INTERVENTIONS: Therapeutic exercises, Therapeutic activity, Neuromuscular re-education, Balance training, Gait training, Patient/Family education, Self Care, Joint mobilization, Stair training, Vestibular training, Canalith repositioning, Visual/preceptual remediation/compensation, DME instructions, Dry Needling, Electrical stimulation, Spinal mobilization, Cryotherapy, Moist heat, Manual therapy, and Re-evaluation.  PLAN FOR NEXT SESSION: HEP review and update, manual techniques as appropriate, aerobic tasks, ROM and flexibility activities, strengthening and PREs, TPDN, gait and balance training as needed    Hildred Laser, PT 08/25/2023, 1:39 PM

## 2023-08-25 ENCOUNTER — Ambulatory Visit: Payer: Medicare Other

## 2023-08-25 DIAGNOSIS — R296 Repeated falls: Secondary | ICD-10-CM

## 2023-08-25 DIAGNOSIS — M5459 Other low back pain: Secondary | ICD-10-CM

## 2023-08-25 DIAGNOSIS — M6281 Muscle weakness (generalized): Secondary | ICD-10-CM

## 2023-08-25 DIAGNOSIS — R2681 Unsteadiness on feet: Secondary | ICD-10-CM | POA: Diagnosis not present

## 2023-09-05 NOTE — Therapy (Signed)
OUTPATIENT PHYSICAL THERAPY TREATMENT NOTE   Patient Name: Tracy [name] MRN: [id number] DOB:02-Apr-1946, 77 y.o., female Today's Date: 09/05/2023  END OF SESSION:            Past Medical History:  Diagnosis Date   Allergic rhinitis    Allergy    Anxiety    Arthritis    Ascending aorta dilation (HCC) 09/13/2022   Echo 09/12/2022: EF 50-55, GR 1 DD, mildly reduced RVSF, normal PASP (RVSP 17.7), mild dilation of ascending aorta (38 mm), RAP 3   Breast cancer (HCC) 04/28/2020   rigtht breast   Bronchitis    CAD (coronary artery disease)    1/19 PCI/DES to pLCX for ISR, normal EF.    Cancer (HCC)    hx of precancerous cells in right breast    Cataract    Cervical dysplasia    unsure of procedure, possible "burning" in her late 35s   CHF (congestive heart failure) (HCC)    Echo 06/2019: EF 55-60, elevated LVEDP, normal RV SF, mild MAC, mild MR, trivial TR   Complication of anesthesia    COPD (chronic obstructive pulmonary disease) (HCC)    early    Depression    Dyspnea    with exertion    Family history of adverse reaction to anesthesia    daughter- problems wiht n/v   GERD (gastroesophageal reflux disease)    Gout    Headache(784.0)    Heart murmur    hx of years ago    Hyperlipidemia    Hypertension    Low back pain    Menopausal syndrome    Myocardial infarct (HCC) 2007   hx of   Overactive bladder    PONV (postoperative nausea and vomiting)    Sleep apnea    Past Surgical History:  Procedure Laterality Date   ANGIOPLASTY     stent 2007   BREAST EXCISIONAL BIOPSY Right 04/2020   ADH/LCIS   BREAST LUMPECTOMY WITH RADIOACTIVE SEED LOCALIZATION Right 04/28/2020   Procedure: RIGHT BREAST LUMPECTOMY X 2  WITH RADIOACTIVE SEED LOCALIZATION;  Surgeon: Manus Rudd, MD;  Location: [facility];  Service: General;  Laterality: Right;  LMA   CARDIAC CATHETERIZATION     COLONOSCOPY     CORONARY STENT INTERVENTION N/A 11/06/2017    Procedure: CORONARY STENT INTERVENTION;  Surgeon: Yvonne Kendall, MD;  Location: [facility];  Service: Cardiovascular;  Laterality: N/A;   LEFT HEART CATH AND CORONARY ANGIOGRAPHY N/A 11/06/2017   Procedure: LEFT HEART CATH AND CORONARY ANGIOGRAPHY;  Surgeon: Yvonne Kendall, MD;  Location: [facility];  Service: Cardiovascular;  Laterality: N/A;   LEFT HEART CATH AND CORONARY ANGIOGRAPHY N/A 07/24/2019   Procedure: LEFT HEART CATH AND CORONARY ANGIOGRAPHY;  Surgeon: Kathleene Hazel, MD;  Location: [facility];  Service: Cardiovascular;  Laterality: N/A;   RIGHT/LEFT HEART CATH AND CORONARY ANGIOGRAPHY N/A 09/29/2020   Procedure: RIGHT/LEFT HEART CATH AND CORONARY ANGIOGRAPHY;  Surgeon: Lyn Records, MD;  Location: [facility];  Service: Cardiovascular;  Laterality: N/A;   ROBOTIC ASSISTED BILATERAL SALPINGO OOPHERECTOMY Bilateral 11/10/2020   Procedure: XI ROBOTIC ASSISTED BILATERAL SALPINGO OOPHORECTOMY WITH MINI LAPAROTOMY FOR DRAINAGE;  Surgeon: Carver Fila, MD;  Location: [facility];  Service: Gynecology;  Laterality: Bilateral;  MINI LAP FIRST   TONSILLECTOMY     VULVECTOMY N/A 11/10/2020   Procedure: WIDE EXCISION VULVECTOMY;  Surgeon: Carver Fila, MD;  Location: [facility];  Service: Gynecology;  Laterality: N/A;   Patient Active Problem List   Diagnosis Date Noted   Hypokalemia 08/07/2023   Intertrigo 04/18/2023   Venous stasis dermatitis of both lower extremities 11/28/2022   Ascending aorta dilation (HCC) 09/13/2022   Dizziness 01/19/2022   Frequent falls 01/19/2022   Tremor of both hands 08/16/2021   Aortic atherosclerosis (HCC) 06/15/2021   Generalized osteoarthritis of multiple sites 12/25/2020   Chronic pain disorder 12/25/2020   Pelvic mass in female    Bilateral lower extremity edema 04/13/2020   Shortness of breath    Anxiety disorder 10/29/2018   CKD (chronic kidney disease), stage III (HCC) 06/27/2018   COPD (chronic  obstructive pulmonary disease) (HCC) 05/25/2018   Obesity 03/20/2018   Dyspnea on exertion 11/06/2017   Abnormal stress test 11/06/2017   Chronic heart failure with preserved ejection fraction (HCC) 09/04/2017   Left ventricular dysfunction 07/31/2017   Cataract, nuclear sclerotic senile, bilateral 02/01/2017   Gouty arthritis of toe of left foot 08/09/2016   Hyperuricemia 08/09/2016   Osteoarthritis (arthritis due to wear and tear of joints) 01/14/2014   Atherosclerosis of native coronary artery of native heart with angina pectoris (HCC) 08/02/2011   Mitral regurgitation 08/02/2011   OVERACTIVE BLADDER 07/02/2010   ACUTE CYSTITIS 05/25/2010   Mixed hyperlipidemia 08/11/2009   Leg edema, left 07/22/2009   GERD 04/11/2008   Constipation 04/11/2008   Essential hypertension 05/30/2007   MYOCARDIAL INFARCTION, HX OF 05/30/2007   Allergic rhinitis 05/30/2007   LOW BACK PAIN 05/30/2007    PCP: [place], [name], MD  REFERRING PROVIDER: [place], [name], MD  REFERRING DIAG: R29.6 (ICD-10-CM) - Frequent falls M54.50,G89.29 (ICD-10-CM) - Chronic low back pain without sciatica, unspecified back pain laterality  Rationale for Evaluation and Treatment: Rehabilitation  THERAPY DIAG:  No diagnosis found.  ONSET DATE: Chronic  SUBJECTIVE:                                                                                                                                                                                           SUBJECTIVE STATEMENT: Arrives to session with increased R knee pain.  Has had symptoms before, attempted to get knee injected but could not get an appointment until December 20.  She is agreeable to PT today.   PERTINENT HISTORY:  Frequent falls Assessment & Plan: Another fall a week ago, no serious injury. Fall precautions discussed. Last PT a year ago, she felt like it helped, new referral placed.   Orders: -     Ambulatory referral to Physical Therapy Chronic low  back pain without sciatica, unspecified back pain laterality Assessment & Plan: No many options  for pain management. Tramadol helped, so we decided to continue. PT will be arranged. Side effects discussed. PDMP reviewed. Will sign med contract next visit.   Orders: -     Ambulatory referral to Physical Therapy -     traMADol HCl; Take 0.5 tablets (25 mg total) by mouth every 12 (twelve) hours as needed.  Dispense: 30 tablet; Refill: 1 PAIN:  Are you having pain?  Yes: NPRS scale: 5/10 Pain location: low back Pain description: ache Aggravating factors: prolonged sitting, bending over Relieving factors: pain patches,   PRECAUTIONS: Fall  RED FLAGS: None   WEIGHT BEARING RESTRICTIONS: No  FALLS:  Has patient fallen in last 6 months? Yes. Number of falls 1  LIVING ENVIRONMENT: Lives with: lives with their spouse Lives in: House/apartment Stairs:  avoids Has following equipment at home: Single point cane and Environmental consultant - 4 wheeled  OCCUPATION: retired  PLOF: Independent  PATIENT GOALS: To reduce my fall risk and manage my back pain  NEXT MD VISIT: PRN  OBJECTIVE:   DIAGNOSTIC FINDINGS:  none  PATIENT SURVEYS:  FOTO 19(39 predicted) ; 08/04/23 40  SCREENING FOR RED FLAGS: none  MUSCLE LENGTH: Hamstrings: WFL in sitting Thomas test: not tested  POSTURE: rounded shoulders, forward head, and increased lumbar lordosis  PALPATION: deferred  LUMBAR ROM: deferred due pain and balance deficits  AROM eval  Flexion   Extension   Right lateral flexion   Left lateral flexion   Right rotation   Left rotation    (Blank rows = not tested)  LOWER EXTREMITY ROM:   WFL for gait and transfers  Active  Right eval Left eval  Hip flexion    Hip extension    Hip abduction    Hip adduction    Hip internal rotation    Hip external rotation    Knee flexion    Knee extension    Ankle dorsiflexion    Ankle plantarflexion    Ankle inversion    Ankle eversion      (Blank rows = not tested)  LOWER EXTREMITY MMT:    MMT Right eval Left eval Right 08/14/23 Left 08/14/23  Hip flexion 3+ 3+ 3+   Hip extension 3+ 3+ 4   Hip abduction 3+ 3+ 4   Hip adduction      Hip internal rotation      Hip external rotation      Knee flexion 3+ 3+ 4 4  Knee extension 3+ 3+ 4 4  Ankle dorsiflexion      Ankle plantarflexion 4 4    Ankle inversion      Ankle eversion       (Blank rows = not tested)  LUMBAR SPECIAL TESTS:  Deferred 07/04/23 Slump test negative B  FUNCTIONAL TESTS:  30 seconds chair stand test 5 reps with BUE support 08/04/23 3 reps due to reports of knee pain/stiffness 08/04/23 2 MWT 149ft mCTSIB position 4 <5s hold 08/04/23 mCTSIB Able to hold all 4 positions for 30s under close S  GAIT: Distance walked: 29ft x2 Assistive device utilized: Environmental consultant - 4 wheeled Level of assistance: Modified independence Comments: slow cadence and flexed posture  TODAY'S TREATMENT:    [facility] Adult PT Treatment:                                                DATE:  08/25/23 Therapeutic Exercise: Nustep L2 8 min Seated p-ball lumbar flexion 10x2 Seated latissimus pressdowns 2s x10 Seate lat pressdowns with alternating knee extension 10/10 Standing heel raises 10x Standing toe raises 10x  [facility] Adult PT Treatment:                                                DATE: 08/23/23 Therapeutic Exercise: Nustep L5 UE/LE x 5 min while taking subjective Su[ine QS x 10 each Supine SLR 2x5 each Supine ball squeeze 2x10 - 5" hold Supine clamshell 2x15 blue band SAQ 2x15 2# LAQ 2x15 2# STS 2x5 - medium table, trying to minimize UE support Modalities: MHP to lumbar paraspinals during supine exercises  [facility] Adult PT Treatment:                                                DATE: 08/14/23 Therapeutic Exercise: Nustep L5 UE/LE x 5 min while taking subjective Su[ine QS x 10 each Supine SLR x 10 each Supine ball squeeze 2x10 - 5" hold SAQ 2x15 2# Bridge 2x10  (small range, difficult) LTR x 10 Supine clamshell 2x15 blue band Supine march 2x20 blue band LAQ 2x15 Modalities: MHP to lumbar paraspinals during supine exercises  [facility] Adult PT Treatment:                                                DATE: 08/10/23 Therapeutic Exercise: Nustep L5 UE/LE x 5 min while taking subjective LTR x 10 Supine clamshell 2x15 GTB Supine march 2x20 GTB Bridge 2x10 (small range) Supine ball squeeze 2x10  LAQ 2x15 STS with eccentric control 2x5 Modalities: MHP to lumbar paraspinals during supine exercises  [facility] Adult PT Treatment:                                                DATE: 08/08/23 Therapeutic Exercise: Nustep L5 UE/LE x 5 min while taking subjective LTR x 10 Supine clamshell 2x10 GTB Supine march 2x20 GTB Bridge 2x10 (small range) Supine ball squeeze 2x10  Supine QS x 10 each - 5" hold LAQ 2x15 Modalities: MHP to lumbar paraspinals during supine exercises  PATIENT EDUCATION:  Education details: Discussed eval findings, rehab rationale and POC and patient is in agreement  Person educated: Patient Education method: Explanation Education comprehension: verbalized understanding and needs further education  HOME EXERCISE PROGRAM: Access Code: [email or web address] URL: [email or web address] Date: 06/27/2023 Prepared by: Gustavus Bryant  Exercises - Sit to Stand with Armchair  - 2 x daily - 5 x weekly - 1 sets - 5 reps - Heel Toe Raises with Counter Support  - 2 x daily - 5 x weekly - 2 sets - 10 reps - Standing [employer] at [employer]  - 2 x daily - 5 x weekly - 2 sets - 10 reps  ASSESSMENT:  CLINICAL IMPRESSION: Todays session accommodated R knee pain.  Focus was core strength and functional exercises in seated position.  Able to ride  Nustep and participate in ankle tasks w/o symptom aggravation  EVAL: Patient is a 77 y.o. female who was seen today for physical therapy evaluation and treatment for low back pain and frequent falls.  She presents with LE strength deficits as well as diminished balance in vision removed and compliant surface environments.  5x STS time demonstrates functional strength deficits and further balance testing needed to identify specific regions of balance dysfunction.  Suspected underlying BPPV may need to be addressed.    OBJECTIVE IMPAIRMENTS: Abnormal gait, decreased activity tolerance, decreased balance, decreased knowledge of condition, decreased mobility, difficulty walking, decreased strength, decreased safety awareness, dizziness, postural dysfunction, and obesity.   ACTIVITY LIMITATIONS: carrying, lifting, standing, stairs, and locomotion level  PERSONAL FACTORS: Age, Past/current experiences, and Time since onset of injury/illness/exacerbation are also affecting patient's functional outcome.   REHAB POTENTIAL: Good  CLINICAL DECISION MAKING: Evolving/moderate complexity  EVALUATION COMPLEXITY: Moderate   GOALS: Goals reviewed with patient? No  SHORT TERM GOALS: Target date: 07/25/2023  Patient to demonstrate independence in HEP  Baseline: [email or web address] Goal status: MET  2.  Assess 2 MWT and establish baseline Baseline: TBD; 07/04/23 188ft with rollator Goal status: MET  3.  Increase reps to 7 on 30s chair stand test Baseline: 5 reps 08/04/23: 3 due to B knee pain 08/14/23: 6 reps Goal status: IN PROGRESS  LONG TERM GOALS: Target date: 08/22/2023  Increase B LE strength to 4-/5 Baseline: See MMT chart Goal status: IN PROGRESS  2.  Increase FOTO score to 39 Baseline: 19;  08/04/23: 40 Goal status: MET  3.  Increase time on position 4 of mCTSIB to 20s Baseline: <5s; 08/04/23 mCTSIB Able to hold all 4 positions for 30s under close S Goal status: MET  4.  Re-asess 2 MWT and note gains Baseline: TBD; 07/04/23 159ft with rollator; 08/04/23 2 MWT 195ft Goal status: MET   PLAN:  PT FREQUENCY: 1-2x/week  PT DURATION: 6 weeks  PLANNED INTERVENTIONS: Therapeutic  exercises, Therapeutic activity, Neuromuscular re-education, Balance training, Gait training, Patient/Family education, Self Care, Joint mobilization, Stair training, Vestibular training, Canalith repositioning, Visual/preceptual remediation/compensation, DME instructions, Dry Needling, Electrical stimulation, Spinal mobilization, Cryotherapy, Moist heat, Manual therapy, and Re-evaluation.  PLAN FOR NEXT SESSION: HEP review and update, manual techniques as appropriate, aerobic tasks, ROM and flexibility activities, strengthening and PREs, TPDN, gait and balance training as needed    Hildred Laser, PT 09/05/2023, 3:35 PM

## 2023-09-06 ENCOUNTER — Ambulatory Visit: Payer: Medicare Other

## 2023-09-06 DIAGNOSIS — R2681 Unsteadiness on feet: Secondary | ICD-10-CM | POA: Diagnosis not present

## 2023-09-06 DIAGNOSIS — M6281 Muscle weakness (generalized): Secondary | ICD-10-CM

## 2023-09-06 DIAGNOSIS — M5459 Other low back pain: Secondary | ICD-10-CM

## 2023-09-06 DIAGNOSIS — R296 Repeated falls: Secondary | ICD-10-CM | POA: Diagnosis not present

## 2023-09-06 NOTE — Therapy (Signed)
OUTPATIENT PHYSICAL THERAPY TREATMENT NOTE   Patient Name: Tracy [name] MRN: [id number] DOB:02/12/1946, 77 y.o., female Today's Date: 09/06/2023  END OF SESSION:  PT End of Session - 09/06/23 1345     Visit Number 13    Number of Visits 20    Date for PT Re-Evaluation 09/20/23    Authorization Type MCR    PT Start Time 1400    PT Stop Time 1442    PT Time Calculation (min) 42 min    Activity Tolerance Patient tolerated treatment well    Behavior During Therapy WFL for tasks assessed/performed                      Past Medical History:  Diagnosis Date   Allergic rhinitis    Allergy    Anxiety    Arthritis    Ascending aorta dilation (HCC) 09/13/2022   Echo 09/12/2022: EF 50-55, GR 1 DD, mildly reduced RVSF, normal PASP (RVSP 17.7), mild dilation of ascending aorta (38 mm), RAP 3   Breast cancer (HCC) 04/28/2020   rigtht breast   Bronchitis    CAD (coronary artery disease)    1/19 PCI/DES to pLCX for ISR, normal EF.    Cancer (HCC)    hx of precancerous cells in right breast    Cataract    Cervical dysplasia    unsure of procedure, possible "burning" in her late 59s   CHF (congestive heart failure) (HCC)    Echo 06/2019: EF 55-60, elevated LVEDP, normal RV SF, mild MAC, mild MR, trivial TR   Complication of anesthesia    COPD (chronic obstructive pulmonary disease) (HCC)    early    Depression    Dyspnea    with exertion    Family history of adverse reaction to anesthesia    daughter- problems wiht n/v   GERD (gastroesophageal reflux disease)    Gout    Headache(784.0)    Heart murmur    hx of years ago    Hyperlipidemia    Hypertension    Low back pain    Menopausal syndrome    Myocardial infarct (HCC) 2007   hx of   Overactive bladder    PONV (postoperative nausea and vomiting)    Sleep apnea    Past Surgical History:  Procedure Laterality Date   ANGIOPLASTY     stent 2007   BREAST EXCISIONAL BIOPSY Right 04/2020   ADH/LCIS    BREAST LUMPECTOMY WITH RADIOACTIVE SEED LOCALIZATION Right 04/28/2020   Procedure: RIGHT BREAST LUMPECTOMY X 2  WITH RADIOACTIVE SEED LOCALIZATION;  Surgeon: Manus Rudd, MD;  Location: [facility];  Service: General;  Laterality: Right;  LMA   CARDIAC CATHETERIZATION     COLONOSCOPY     CORONARY STENT INTERVENTION N/A 11/06/2017   Procedure: CORONARY STENT INTERVENTION;  Surgeon: Yvonne Kendall, MD;  [facility]: [facility];  Service: Cardiovascular;  Laterality: N/A;   LEFT HEART CATH AND CORONARY ANGIOGRAPHY N/A 11/06/2017   Procedure: LEFT HEART CATH AND CORONARY ANGIOGRAPHY;  Surgeon: Yvonne Kendall, MD;  Location: [facility];  Service: Cardiovascular;  Laterality: N/A;   LEFT HEART CATH AND CORONARY ANGIOGRAPHY N/A 07/24/2019   Procedure: LEFT HEART CATH AND CORONARY ANGIOGRAPHY;  Surgeon: Kathleene Hazel, MD;  Location: [facility];  Service: Cardiovascular;  Laterality: N/A;   RIGHT/LEFT HEART CATH AND CORONARY ANGIOGRAPHY N/A 09/29/2020   Procedure: RIGHT/LEFT HEART CATH AND CORONARY ANGIOGRAPHY;  Surgeon: Katrinka Blazing,  Barry Dienes, MD;  Location: [facility];  Service: Cardiovascular;  Laterality: N/A;   ROBOTIC ASSISTED BILATERAL SALPINGO OOPHERECTOMY Bilateral 11/10/2020   Procedure: XI ROBOTIC ASSISTED BILATERAL SALPINGO OOPHORECTOMY WITH MINI LAPAROTOMY FOR DRAINAGE;  Surgeon: Carver Fila, MD;  Location: [facility];  Service: Gynecology;  Laterality: Bilateral;  MINI LAP FIRST   TONSILLECTOMY     VULVECTOMY N/A 11/10/2020   Procedure: WIDE EXCISION VULVECTOMY;  Surgeon: Carver Fila, MD;  Location: [facility];  Service: Gynecology;  Laterality: N/A;   Patient Active Problem List   Diagnosis Date Noted   Hypokalemia 08/07/2023   Intertrigo 04/18/2023   Venous stasis dermatitis of both lower extremities 11/28/2022   Ascending aorta dilation (HCC) 09/13/2022   Dizziness 01/19/2022   Frequent falls 01/19/2022   Tremor of both  hands 08/16/2021   Aortic atherosclerosis (HCC) 06/15/2021   Generalized osteoarthritis of multiple sites 12/25/2020   Chronic pain disorder 12/25/2020   Pelvic mass in female    Bilateral lower extremity edema 04/13/2020   Shortness of breath    Anxiety disorder 10/29/2018   CKD (chronic kidney disease), stage III (HCC) 06/27/2018   COPD (chronic obstructive pulmonary disease) (HCC) 05/25/2018   Obesity 03/20/2018   Dyspnea on exertion 11/06/2017   Abnormal stress test 11/06/2017   Chronic heart failure with preserved ejection fraction (HCC) 09/04/2017   Left ventricular dysfunction 07/31/2017   Cataract, nuclear sclerotic senile, bilateral 02/01/2017   Gouty arthritis of toe of left foot 08/09/2016   Hyperuricemia 08/09/2016   Osteoarthritis (arthritis due to wear and tear of joints) 01/14/2014   Atherosclerosis of native coronary artery of native heart with angina pectoris (HCC) 08/02/2011   Mitral regurgitation 08/02/2011   OVERACTIVE BLADDER 07/02/2010   ACUTE CYSTITIS 05/25/2010   Mixed hyperlipidemia 08/11/2009   Leg edema, left 07/22/2009   GERD 04/11/2008   Constipation 04/11/2008   Essential hypertension 05/30/2007   MYOCARDIAL INFARCTION, HX OF 05/30/2007   Allergic rhinitis 05/30/2007   LOW BACK PAIN 05/30/2007    PCP: [place], [name], MD  REFERRING PROVIDER: [place], [name], MD  REFERRING DIAG: R29.6 (ICD-10-CM) - Frequent falls M54.50,G89.29 (ICD-10-CM) - Chronic low back pain without sciatica, unspecified back pain laterality  Rationale for Evaluation and Treatment: Rehabilitation  THERAPY DIAG:  Other low back pain  Muscle weakness (generalized)  ONSET DATE: Chronic  SUBJECTIVE:                                                                                                                                                                                           SUBJECTIVE STATEMENT:  Pt presents to PT  with reports of continued knee pian. Has been  compliant with HEP.   PERTINENT HISTORY:  See PMH  PAIN:  Are you having pain?  Yes: NPRS scale: 5/10 Pain location: low back Pain description: ache Aggravating factors: prolonged sitting, bending over Relieving factors: pain patches,   PRECAUTIONS: Fall  RED FLAGS: None   WEIGHT BEARING RESTRICTIONS: No  FALLS:  Has patient fallen in last 6 months? Yes. Number of falls 1  LIVING ENVIRONMENT: Lives with: lives with their spouse Lives in: House/apartment Stairs:  avoids Has following equipment at home: Single point cane and Environmental consultant - 4 wheeled  OCCUPATION: retired  PLOF: Independent  PATIENT GOALS: To reduce my fall risk and manage my back pain  NEXT MD VISIT: PRN  OBJECTIVE:   DIAGNOSTIC FINDINGS:  none  PATIENT SURVEYS:  FOTO 19(39 predicted) ; 08/04/23 40  SCREENING FOR RED FLAGS: none  MUSCLE LENGTH: Hamstrings: WFL in sitting Thomas test: not tested  POSTURE: rounded shoulders, forward head, and increased lumbar lordosis  PALPATION: deferred  LUMBAR ROM: deferred due pain and balance deficits  AROM eval  Flexion   Extension   Right lateral flexion   Left lateral flexion   Right rotation   Left rotation    (Blank rows = not tested)  LOWER EXTREMITY ROM:   WFL for gait and transfers  Active  Right eval Left eval  Hip flexion    Hip extension    Hip abduction    Hip adduction    Hip internal rotation    Hip external rotation    Knee flexion    Knee extension    Ankle dorsiflexion    Ankle plantarflexion    Ankle inversion    Ankle eversion     (Blank rows = not tested)  LOWER EXTREMITY MMT:    MMT Right eval Left eval Right 08/14/23 Left 08/14/23  Hip flexion 3+ 3+ 3+   Hip extension 3+ 3+ 4   Hip abduction 3+ 3+ 4   Hip adduction      Hip internal rotation      Hip external rotation      Knee flexion 3+ 3+ 4 4  Knee extension 3+ 3+ 4 4  Ankle dorsiflexion      Ankle plantarflexion 4 4    Ankle inversion       Ankle eversion       (Blank rows = not tested)  LUMBAR SPECIAL TESTS:  Deferred 07/04/23 Slump test negative B  FUNCTIONAL TESTS:  30 seconds chair stand test 5 reps with BUE support 08/04/23 3 reps due to reports of knee pain/stiffness 08/04/23 2 MWT 176ft mCTSIB position 4 <5s hold 08/04/23 mCTSIB Able to hold all 4 positions for 30s under close S  GAIT: Distance walked: 31ft x2 Assistive device utilized: Environmental consultant - 4 wheeled Level of assistance: Modified independence Comments: slow cadence and flexed posture  TODAY'S TREATMENT:    [facility] Adult PT Treatment:                                                DATE: 09/06/23 Therapeutic Exercise: Nustep L6 x 5 min while taking subjective LAQ 2x10 2# Seated marches 2x20 2# LTR x 10  Supine clamshell 2x15 blue band Supine SLR x 5 each - L difficult STS with eccentric 2x5 - high table Seated hamstring curl  2x10 black band Standing heel raises x 15 Standing hip abd x 5 each - bilat UE support Modalities: MHP to lumbar paraspinals during supine exercises  [facility] Adult PT Treatment:                                                DATE: 08/25/23 Therapeutic Exercise: Nustep L2 8 min Seated p-ball lumbar flexion 10x2 Seated latissimus pressdowns 2s x10 Seate lat pressdowns with alternating knee extension 10/10 Standing heel raises 10x Standing toe raises 10x  [facility] Adult PT Treatment:                                                DATE: 08/23/23 Therapeutic Exercise: Nustep L5 UE/LE x 5 min while taking subjective Supine QS x 10 each Supine SLR 2x5 each Supine ball squeeze 2x10 - 5" hold Supine clamshell 2x15 blue band SAQ 2x15 2# LAQ 2x15 2# STS 2x5 - medium table, trying to minimize UE support Modalities: MHP to lumbar paraspinals during supine exercises  [facility] Adult PT Treatment:                                                DATE: 08/14/23 Therapeutic Exercise: Nustep L5 UE/LE x 5 min while taking subjective Su[ine QS x 10  each Supine SLR x 10 each Supine ball squeeze 2x10 - 5" hold SAQ 2x15 2# Bridge 2x10 (small range, difficult) LTR x 10 Supine clamshell 2x15 blue band Supine march 2x20 blue band LAQ 2x15 Modalities: MHP to lumbar paraspinals during supine exercises  [facility] Adult PT Treatment:                                                DATE: 08/10/23 Therapeutic Exercise: Nustep L5 UE/LE x 5 min while taking subjective LTR x 10 Supine clamshell 2x15 GTB Supine march 2x20 GTB Bridge 2x10 (small range) Supine ball squeeze 2x10  LAQ 2x15 STS with eccentric control 2x5 Modalities: MHP to lumbar paraspinals during supine exercises  [facility] Adult PT Treatment:                                                DATE: 08/08/23 Therapeutic Exercise: Nustep L5 UE/LE x 5 min while taking subjective LTR x 10 Supine clamshell 2x10 GTB Supine march 2x20 GTB Bridge 2x10 (small range) Supine ball squeeze 2x10  Supine QS x 10 each - 5" hold LAQ 2x15 Modalities: MHP to lumbar paraspinals during supine exercises  PATIENT EDUCATION:  Education details: Discussed eval findings, rehab rationale and POC and patient is in agreement  Person educated: Patient Education method: Explanation Education comprehension: verbalized understanding and needs further education  HOME EXERCISE PROGRAM: Access Code: [email or web address] URL: [email or web address] Date: 06/27/2023 Prepared by: Gustavus Bryant  Exercises - Sit to Stand with Armchair  - 2 x daily -  5 x weekly - 1 sets - 5 reps - Heel Toe Raises with Counter Support  - 2 x daily - 5 x weekly - 2 sets - 10 reps - Standing [employer]  - 2 x daily - 5 x weekly - 2 sets - 10 reps  ASSESSMENT:  CLINICAL IMPRESSION:  Pt was able to complete prescribed exercises with no adverse effect. Therapy focused on improving LE strength today for improving mobility and decreasing pain. Will continue to progress as able per POC.   EVAL: Patient is a 77 y.o.  female who was seen today for physical therapy evaluation and treatment for low back pain and frequent falls. She presents with LE strength deficits as well as diminished balance in vision removed and compliant surface environments.  5x STS time demonstrates functional strength deficits and further balance testing needed to identify specific regions of balance dysfunction.  Suspected underlying BPPV may need to be addressed.    OBJECTIVE IMPAIRMENTS: Abnormal gait, decreased activity tolerance, decreased balance, decreased knowledge of condition, decreased mobility, difficulty walking, decreased strength, decreased safety awareness, dizziness, postural dysfunction, and obesity.   ACTIVITY LIMITATIONS: carrying, lifting, standing, stairs, and locomotion level  PERSONAL FACTORS: Age, Past/current experiences, and Time since onset of injury/illness/exacerbation are also affecting patient's functional outcome.   REHAB POTENTIAL: Good  CLINICAL DECISION MAKING: Evolving/moderate complexity  EVALUATION COMPLEXITY: Moderate   GOALS: Goals reviewed with patient? No  SHORT TERM GOALS: Target date: 07/25/2023  Patient to demonstrate independence in HEP  Baseline: [email or web address] Goal status: MET  2.  Assess 2 MWT and establish baseline Baseline: TBD; 07/04/23 177ft with rollator Goal status: MET  3.  Increase reps to 7 on 30s chair stand test Baseline: 5 reps 08/04/23: 3 due to B knee pain 08/14/23: 6 reps Goal status: IN PROGRESS  LONG TERM GOALS: Target date: 08/22/2023  Increase B LE strength to 4-/5 Baseline: See MMT chart Goal status: IN PROGRESS  2.  Increase FOTO score to 39 Baseline: 19;  08/04/23: 40 Goal status: MET  3.  Increase time on position 4 of mCTSIB to 20s Baseline: <5s; 08/04/23 mCTSIB Able to hold all 4 positions for 30s under close S Goal status: MET  4.  Re-asess 2 MWT and note gains Baseline: TBD; 07/04/23 13ft with rollator; 08/04/23 2 MWT 147ft Goal status:  MET   PLAN:  PT FREQUENCY: 1-2x/week  PT DURATION: 6 weeks  PLANNED INTERVENTIONS: Therapeutic exercises, Therapeutic activity, Neuromuscular re-education, Balance training, Gait training, Patient/Family education, Self Care, Joint mobilization, Stair training, Vestibular training, Canalith repositioning, Visual/preceptual remediation/compensation, DME instructions, Dry Needling, Electrical stimulation, Spinal mobilization, Cryotherapy, Moist heat, Manual therapy, and Re-evaluation.  PLAN FOR NEXT SESSION: HEP review and update, manual techniques as appropriate, aerobic tasks, ROM and flexibility activities, strengthening and PREs, TPDN, gait and balance training as needed    Eloy End, PT 09/06/2023, 2:55 PM

## 2023-09-08 ENCOUNTER — Ambulatory Visit: Payer: Medicare Other

## 2023-09-08 ENCOUNTER — Ambulatory Visit (INDEPENDENT_AMBULATORY_CARE_PROVIDER_SITE_OTHER): Payer: Medicare Other | Admitting: Obstetrics and Gynecology

## 2023-09-08 ENCOUNTER — Encounter: Payer: Self-pay | Admitting: Obstetrics and Gynecology

## 2023-09-08 VITALS — BP 132/80 | HR 94 | Ht 64.0 in | Wt 259.0 lb

## 2023-09-08 DIAGNOSIS — R2681 Unsteadiness on feet: Secondary | ICD-10-CM | POA: Diagnosis not present

## 2023-09-08 DIAGNOSIS — N958 Other specified menopausal and perimenopausal disorders: Secondary | ICD-10-CM

## 2023-09-08 DIAGNOSIS — N762 Acute vulvitis: Secondary | ICD-10-CM | POA: Diagnosis not present

## 2023-09-08 DIAGNOSIS — M6281 Muscle weakness (generalized): Secondary | ICD-10-CM

## 2023-09-08 DIAGNOSIS — N9089 Other specified noninflammatory disorders of vulva and perineum: Secondary | ICD-10-CM

## 2023-09-08 DIAGNOSIS — R296 Repeated falls: Secondary | ICD-10-CM | POA: Diagnosis not present

## 2023-09-08 DIAGNOSIS — Z01419 Encounter for gynecological examination (general) (routine) without abnormal findings: Secondary | ICD-10-CM

## 2023-09-08 DIAGNOSIS — M5459 Other low back pain: Secondary | ICD-10-CM

## 2023-09-08 DIAGNOSIS — Z1339 Encounter for screening examination for other mental health and behavioral disorders: Secondary | ICD-10-CM | POA: Diagnosis not present

## 2023-09-08 DIAGNOSIS — L292 Pruritus vulvae: Secondary | ICD-10-CM | POA: Diagnosis not present

## 2023-09-08 MED ORDER — PREMARIN 0.625 MG/GM VA CREA: TOPICAL_CREAM | VAGINAL | 12 refills | Status: DC

## 2023-09-08 NOTE — Progress Notes (Signed)
77 y.o. New GYN presents for vaginal pain 8/10, bumps x 2 months.

## 2023-09-08 NOTE — Patient Instructions (Signed)
You will use the vaginal cream (pea sized amount) or pill at night for 2 weeks. You will switch to 2 times a week TOTAL after that.   Please apply a thin layer of vaseline or aquaphor to the clean dry skin that touches your pads/underwear to protect it and prevent itching

## 2023-09-08 NOTE — Progress Notes (Signed)
   NEW GYNECOLOGY VISIT  Subjective:  Tracy [name] is a 77 y.o. menopausal G2P2 with complex medical history presenting for vaginal itching and bumps  Referred by PCP. Presented with intermittent dysuria & pruritus and reported ulcers in the genital area. PCP noted a superficial ulcer on the posterior aspect of the vaginal introitus with a papular lesion that was tender. Sent rx for nystatin-triamcinolone. Approx 8 weeks later reported improvement in symptoms but persistent vaginal discomfort/pain. Repeat exam was deferred and patient was referred here for evaluation.   Today she reports significant vulvovaginal itching and scratching for weeks. States itching is around the introitus. The areas that are scratched are painful and sometimes bleed. She notes a lump inside her vagina. She reports ulcers/bumps around her genital area since she was a teen but nothing recently. She has urinary incontinence and wears pads/diapers daily due to leakage.   Reports history of abnormal paps. Last pap > 10 years ago.   I personally reviewed: - PCP notes by Dr. [place] 04/18/23, 06/06/23, & 08/07/23 - Cervicovaginal ancillary 04/18/23 neg for BV/candida  Objective:   Vitals:   09/08/23 1001  BP: 132/80  Pulse: 94  Weight: 259 lb (117.5 kg)  Height: 5\' 4"  (1.626 m)   General:  Alert, oriented and cooperative. Patient is in no acute distress.  Skin: Skin is warm and dry. No rash noted.   Cardiovascular: Normal heart rate noted  Respiratory: Normal respiratory effort, no problems with respiration noted  Abdomen: Soft, non-tender, non-distended   Pelvic: Normal vulvar skin, potentially with mild erythema on labia majora. 7mm raised, flesh colored lesion on inside of R labia majora that overall appears benign but biopsy obtained given symptoms (see procedure note). Pale atrophic vaginal epithelium, no lesions or ulcerations noted.  Cervix visually normal.  Exam performed in the presence of a  chaperone  Assessment and Plan:  Tracy [name] is a 77 y.o. with genitourinary syndrome of menopause  Genitourinary syndrome of menopause Discussed role for vaginal estrogen for pain/dryness associated with GSM. She is agreeable to trying. Will assess response in 4-8 weeks -     conjugated estrogens (PREMARIN) vaginal cream; Apply a pea sized amount to the inside of the vagina nightly for two weeks, then twice weekly thereafter  Vulvar itching Vulvar lesion Pap collected after discussion of r/b, potential limited utility at this age. Pt would like to get pap. Vulvar biopsy done, see procedure note Discussed using barrier cream/emollient daily to prevent irritation from pads as well as general vulvar care If persistent itching & scratching, will trial topical steroids and consider long term twice weekly use -     Surgical pathology( [place]/ [facility])  Return in about 2 months (around 11/08/2023) for follow up gyn - itching.  Future Appointments  Date Time Provider Department Center  09/11/2023 12:15 PM Georganna Skeans [facility]  09/12/2023  1:00 PM [facility] 4 [id number]  09/13/2023  2:00 PM Ian Bushman [facility]  09/19/2023 12:15 PM Berta Minor, PTA [facility]  09/21/2023  1:15 PM Hildred Laser, PT [facility]  09/26/2023  2:00 PM Nelida Gores [facility]  09/28/2023  1:15 PM Nelida Gores [facility]  11/09/2023  1:30 PM Lennart Pall, MD [facility] None    Lennart Pall, MD

## 2023-09-08 NOTE — Progress Notes (Signed)
    GYNECOLOGY OFFICE PROCEDURE NOTE  77 y.o. menopausal G2P2 presenting for vulvar itching and small raised, mobile flesh colored lesion noted on the inner right labia majora on exam.  Informed consent and review of risks, benefit and alternatives performed. Written consent given.   Vulva was prepped with betadine. 2cc of lidocaine 1% was injected into the vulva for local anesthesia. A 3mm punch biopsy was obtained and sharply removed.   All specimens were labeled and sent to pathology.  Silver nitrate applied to biopsy site for good hemostasis. Pt tolerated well with minimal pain and bleeding.   Patient was given post procedure instructions.  Will follow up pathology and manage accordingly; patient will be contacted with results and recommendations.  Routine preventative health maintenance measures emphasized.  Harvie Bridge, MD Obstetrician & Gynecologist, [facility] for [employer], [facility]

## 2023-09-08 NOTE — Therapy (Signed)
OUTPATIENT PHYSICAL THERAPY TREATMENT NOTE   Patient Name: MILLIANNA MCKERROW MRN: [id number] DOB:Jun 04, 1946, 77 y.o., female Today's Date: 09/08/2023  END OF SESSION:  PT End of Session - 09/08/23 0902     Visit Number 15    Number of Visits 20    Date for PT Re-Evaluation 09/20/23    Authorization Type MCR                      Past Medical History:  Diagnosis Date   Allergic rhinitis    Allergy    Anxiety    Arthritis    Ascending aorta dilation (HCC) 09/13/2022   Echo 09/12/2022: EF 50-55, GR 1 DD, mildly reduced RVSF, normal PASP (RVSP 17.7), mild dilation of ascending aorta (38 mm), RAP 3   Breast cancer (HCC) 04/28/2020   rigtht breast   Bronchitis    CAD (coronary artery disease)    1/19 PCI/DES to pLCX for ISR, normal EF.    Cancer (HCC)    hx of precancerous cells in right breast    Cataract    Cervical dysplasia    unsure of procedure, possible "burning" in her late 32s   CHF (congestive heart failure) (HCC)    Echo 06/2019: EF 55-60, elevated LVEDP, normal RV SF, mild MAC, mild MR, trivial TR   Complication of anesthesia    COPD (chronic obstructive pulmonary disease) (HCC)    early    Depression    Dyspnea    with exertion    Family history of adverse reaction to anesthesia    daughter- problems wiht n/v   GERD (gastroesophageal reflux disease)    Gout    Headache(784.0)    Heart murmur    hx of years ago    Hyperlipidemia    Hypertension    Low back pain    Menopausal syndrome    Myocardial infarct (HCC) 2007   hx of   Overactive bladder    PONV (postoperative nausea and vomiting)    Sleep apnea    Past Surgical History:  Procedure Laterality Date   ANGIOPLASTY     stent 2007   BREAST EXCISIONAL BIOPSY Right 04/2020   ADH/LCIS   BREAST LUMPECTOMY WITH RADIOACTIVE SEED LOCALIZATION Right 04/28/2020   Procedure: RIGHT BREAST LUMPECTOMY X 2  WITH RADIOACTIVE SEED LOCALIZATION;  Surgeon: Manus Rudd, MD;  Location: [facility]  [place];  Service: General;  Laterality: Right;  LMA   CARDIAC CATHETERIZATION     COLONOSCOPY     CORONARY STENT INTERVENTION N/A 11/06/2017   Procedure: CORONARY STENT INTERVENTION;  Surgeon: Yvonne Kendall, MD;  Location: [facility];  Service: Cardiovascular;  Laterality: N/A;   LEFT HEART CATH AND CORONARY ANGIOGRAPHY N/A 11/06/2017   Procedure: LEFT HEART CATH AND CORONARY ANGIOGRAPHY;  Surgeon: Yvonne Kendall, MD;  Location: [facility];  Service: Cardiovascular;  Laterality: N/A;   LEFT HEART CATH AND CORONARY ANGIOGRAPHY N/A 07/24/2019   Procedure: LEFT HEART CATH AND CORONARY ANGIOGRAPHY;  Surgeon: Kathleene Hazel, MD;  Location: [facility];  Service: Cardiovascular;  Laterality: N/A;   RIGHT/LEFT HEART CATH AND CORONARY ANGIOGRAPHY N/A 09/29/2020   Procedure: RIGHT/LEFT HEART CATH AND CORONARY ANGIOGRAPHY;  Surgeon: Lyn Records, MD;  Location: [facility];  Service: Cardiovascular;  Laterality: N/A;   ROBOTIC ASSISTED BILATERAL SALPINGO OOPHERECTOMY Bilateral 11/10/2020   Procedure: XI ROBOTIC ASSISTED BILATERAL SALPINGO OOPHORECTOMY WITH MINI LAPAROTOMY FOR DRAINAGE;  Surgeon: Eugene Garnet  R, MD;  Location: [facility];  Service: Gynecology;  Laterality: Bilateral;  MINI LAP FIRST   TONSILLECTOMY     VULVECTOMY N/A 11/10/2020   Procedure: WIDE EXCISION VULVECTOMY;  Surgeon: Carver Fila, MD;  Location: [facility];  Service: Gynecology;  Laterality: N/A;   Patient Active Problem List   Diagnosis Date Noted   Hypokalemia 08/07/2023   Intertrigo 04/18/2023   Venous stasis dermatitis of both lower extremities 11/28/2022   Ascending aorta dilation (HCC) 09/13/2022   Dizziness 01/19/2022   Frequent falls 01/19/2022   Tremor of both hands 08/16/2021   Aortic atherosclerosis (HCC) 06/15/2021   Generalized osteoarthritis of multiple sites 12/25/2020   Chronic pain disorder 12/25/2020   Pelvic mass in female    Bilateral lower  extremity edema 04/13/2020   Shortness of breath    Anxiety disorder 10/29/2018   CKD (chronic kidney disease), stage III (HCC) 06/27/2018   COPD (chronic obstructive pulmonary disease) (HCC) 05/25/2018   Obesity 03/20/2018   Dyspnea on exertion 11/06/2017   Abnormal stress test 11/06/2017   Chronic heart failure with preserved ejection fraction (HCC) 09/04/2017   Left ventricular dysfunction 07/31/2017   Cataract, nuclear sclerotic senile, bilateral 02/01/2017   Gouty arthritis of toe of left foot 08/09/2016   Hyperuricemia 08/09/2016   Osteoarthritis (arthritis due to wear and tear of joints) 01/14/2014   Atherosclerosis of native coronary artery of native heart with angina pectoris (HCC) 08/02/2011   Mitral regurgitation 08/02/2011   OVERACTIVE BLADDER 07/02/2010   ACUTE CYSTITIS 05/25/2010   Mixed hyperlipidemia 08/11/2009   Leg edema, left 07/22/2009   GERD 04/11/2008   Constipation 04/11/2008   Essential hypertension 05/30/2007   MYOCARDIAL INFARCTION, HX OF 05/30/2007   Allergic rhinitis 05/30/2007   LOW BACK PAIN 05/30/2007    PCP: [place], [name], MD  REFERRING PROVIDER: [place], [name], MD  REFERRING DIAG: R29.6 (ICD-10-CM) - Frequent falls M54.50,G89.29 (ICD-10-CM) - Chronic low back pain without sciatica, unspecified back pain laterality  Rationale for Evaluation and Treatment: Rehabilitation  THERAPY DIAG:  Other low back pain  Unsteadiness on feet  Muscle weakness (generalized)  ONSET DATE: Chronic  SUBJECTIVE:                                                                                                                                                                                           SUBJECTIVE STATEMENT: Cold weather exacerbates low back symptoms.  Had a LOB episode as she was walking in home with cane but did not fall.   PERTINENT HISTORY:  See PMH  PAIN:  Are you having pain?  Yes: NPRS scale: 5/10 Pain  location: low back Pain  description: ache Aggravating factors: prolonged sitting, bending over Relieving factors: pain patches,   PRECAUTIONS: Fall  RED FLAGS: None   WEIGHT BEARING RESTRICTIONS: No  FALLS:  Has patient fallen in last 6 months? Yes. Number of falls 1  LIVING ENVIRONMENT: Lives with: lives with their spouse Lives in: House/apartment Stairs:  avoids Has following equipment at home: Single point cane and Environmental consultant - 4 wheeled  OCCUPATION: retired  PLOF: Independent  PATIENT GOALS: To reduce my fall risk and manage my back pain  NEXT MD VISIT: PRN  OBJECTIVE:   DIAGNOSTIC FINDINGS:  none  PATIENT SURVEYS:  FOTO 19(39 predicted) ; 08/04/23 40  SCREENING FOR RED FLAGS: none  MUSCLE LENGTH: Hamstrings: WFL in sitting Thomas test: not tested  POSTURE: rounded shoulders, forward head, and increased lumbar lordosis  PALPATION: deferred  LUMBAR ROM: deferred due pain and balance deficits  AROM eval  Flexion   Extension   Right lateral flexion   Left lateral flexion   Right rotation   Left rotation    (Blank rows = not tested)  LOWER EXTREMITY ROM:   WFL for gait and transfers  Active  Right eval Left eval  Hip flexion    Hip extension    Hip abduction    Hip adduction    Hip internal rotation    Hip external rotation    Knee flexion    Knee extension    Ankle dorsiflexion    Ankle plantarflexion    Ankle inversion    Ankle eversion     (Blank rows = not tested)  LOWER EXTREMITY MMT:    MMT Right eval Left eval Right 08/14/23 Left 08/14/23  Hip flexion 3+ 3+ 3+   Hip extension 3+ 3+ 4   Hip abduction 3+ 3+ 4   Hip adduction      Hip internal rotation      Hip external rotation      Knee flexion 3+ 3+ 4 4  Knee extension 3+ 3+ 4 4  Ankle dorsiflexion      Ankle plantarflexion 4 4    Ankle inversion      Ankle eversion       (Blank rows = not tested)  LUMBAR SPECIAL TESTS:  Deferred 07/04/23 Slump test negative B  FUNCTIONAL TESTS:  30  seconds chair stand test 5 reps with BUE support 08/04/23 3 reps due to reports of knee pain/stiffness 08/04/23 2 MWT 167ft mCTSIB position 4 <5s hold 08/04/23 mCTSIB Able to hold all 4 positions for 30s under close S  GAIT: Distance walked: 47ft x2 Assistive device utilized: Environmental consultant - 4 wheeled Level of assistance: Modified independence Comments: slow cadence and flexed posture  TODAY'S TREATMENT:    [facility] Adult PT Treatment:                                                DATE: 09/08/23  Therapeutic Activity: 2 MWT 264ft  30s chair stand test 5 reps Nustep L4 6 min to facilitate shoulder/hip dissociation and promote more fluid gait pattern.   09/08/23 0001  Berg Balance Test  Sit to Stand 3  Standing Unsupported 3  Sitting with Back Unsupported but Feet Supported on Floor or Stool 4  Stand to Sit 4  Transfers 3  Standing Unsupported with Eyes Closed 4  Standing Unsupported  with Feet Together 3  From Standing, Reach Forward with Outstretched Arm 3  From Standing Position, Pick up Object from Floor 4  From Standing Position, Turn to Look Behind Over each Shoulder 4  Turn 360 Degrees 2  Standing Unsupported, Alternately Place Feet on Step/Stool 3  Standing Unsupported, One Foot in Front 3  Standing on One Leg 1  Total Score 44     [facility] Adult PT Treatment:                                                DATE: 09/06/23 Therapeutic Exercise: Nustep L6 x 5 min while taking subjective LAQ 2x10 2# Seated marches 2x20 2# LTR x 10  Supine clamshell 2x15 blue band Supine SLR x 5 each - L difficult STS with eccentric 2x5 - high table Seated hamstring curl 2x10 black band Standing heel raises x 15 Standing hip abd x 5 each - bilat UE support Modalities: MHP to lumbar paraspinals during supine exercises  [facility] Adult PT Treatment:                                                DATE: 08/25/23 Therapeutic Exercise: Nustep L2 8 min Seated p-ball lumbar flexion 10x2 Seated  latissimus pressdowns 2s x10 Seate lat pressdowns with alternating knee extension 10/10 Standing heel raises 10x Standing toe raises 10x  [facility] Adult PT Treatment:                                                DATE: 08/23/23 Therapeutic Exercise: Nustep L5 UE/LE x 5 min while taking subjective Supine QS x 10 each Supine SLR 2x5 each Supine ball squeeze 2x10 - 5" hold Supine clamshell 2x15 blue band SAQ 2x15 2# LAQ 2x15 2# STS 2x5 - medium table, trying to minimize UE support Modalities: MHP to lumbar paraspinals during supine exercises  [facility] Adult PT Treatment:                                                DATE: 08/14/23 Therapeutic Exercise: Nustep L5 UE/LE x 5 min while taking subjective Su[ine QS x 10 each Supine SLR x 10 each Supine ball squeeze 2x10 - 5" hold SAQ 2x15 2# Bridge 2x10 (small range, difficult) LTR x 10 Supine clamshell 2x15 blue band Supine march 2x20 blue band LAQ 2x15 Modalities: MHP to lumbar paraspinals during supine exercises  [facility] Adult PT Treatment:                                                DATE: 08/10/23 Therapeutic Exercise: Nustep L5 UE/LE x 5 min while taking subjective LTR x 10 Supine clamshell 2x15 GTB Supine march 2x20 GTB Bridge 2x10 (small range) Supine ball squeeze 2x10  LAQ 2x15 STS with eccentric control 2x5 Modalities: MHP to lumbar  paraspinals during supine exercises  [facility] Adult PT Treatment:                                                DATE: 08/08/23 Therapeutic Exercise: Nustep L5 UE/LE x 5 min while taking subjective LTR x 10 Supine clamshell 2x10 GTB Supine march 2x20 GTB Bridge 2x10 (small range) Supine ball squeeze 2x10  Supine QS x 10 each - 5" hold LAQ 2x15 Modalities: MHP to lumbar paraspinals during supine exercises  PATIENT EDUCATION:  Education details: Discussed eval findings, rehab rationale and POC and patient is in agreement  Person educated: Patient Education method: Explanation Education  comprehension: verbalized understanding and needs further education  HOME EXERCISE PROGRAM: Access Code: [email or web address] URL: [email or web address] Date: 06/27/2023 Prepared by: Gustavus Bryant  Exercises - Sit to Stand with Armchair  - 2 x daily - 5 x weekly - 1 sets - 5 reps - Heel Toe Raises with Counter Support  - 2 x daily - 5 x weekly - 2 sets - 10 reps - Standing [employer] at [employer]  - 2 x daily - 5 x weekly - 2 sets - 10 reps  ASSESSMENT:  CLINICAL IMPRESSION: Todays session assessed functional gains with patient able to extend distance on 2 MWT as well as increase BERG score indicating gains in static balance.  30s chair stand reps unchanged but test performed as patient fatigued from 2 MWT.  Recommend focus on dynamic balance and SLS tasks   EVAL: Patient is a 77 y.o. female who was seen today for physical therapy evaluation and treatment for low back pain and frequent falls. She presents with LE strength deficits as well as diminished balance in vision removed and compliant surface environments.  5x STS time demonstrates functional strength deficits and further balance testing needed to identify specific regions of balance dysfunction.  Suspected underlying BPPV may need to be addressed.    OBJECTIVE IMPAIRMENTS: Abnormal gait, decreased activity tolerance, decreased balance, decreased knowledge of condition, decreased mobility, difficulty walking, decreased strength, decreased safety awareness, dizziness, postural dysfunction, and obesity.   ACTIVITY LIMITATIONS: carrying, lifting, standing, stairs, and locomotion level  PERSONAL FACTORS: Age, Past/current experiences, and Time since onset of injury/illness/exacerbation are also affecting patient's functional outcome.   REHAB POTENTIAL: Good  CLINICAL DECISION MAKING: Evolving/moderate complexity  EVALUATION COMPLEXITY: Moderate   GOALS: Goals reviewed with patient? No  SHORT TERM GOALS: Target date:  07/25/2023  Patient to demonstrate independence in HEP  Baseline: [email or web address] Goal status: MET  2.  Assess 2 MWT and establish baseline Baseline: TBD; 07/04/23 131ft with rollator Goal status: MET  3.  Increase reps to 7 on 30s chair stand test Baseline: 5 reps 08/04/23: 3 due to B knee pain 08/14/23: 6 reps Goal status: IN PROGRESS  LONG TERM GOALS: Target date: 08/22/2023  Increase B LE strength to 4-/5 Baseline: See MMT chart Goal status: IN PROGRESS  2.  Increase FOTO score to 39 Baseline: 19;  08/04/23: 40 Goal status: MET  3.  Increase time on position 4 of mCTSIB to 20s Baseline: <5s; 08/04/23 mCTSIB Able to hold all 4 positions for 30s under close S Goal status: MET  4.  Re-asess 2 MWT and note gains Baseline: TBD; 07/04/23 162ft with rollator; 08/04/23 2 MWT 163ft; 09/08/23 264ft Goal status: MET   PLAN:  PT FREQUENCY: 1-2x/week  PT  DURATION: 6 weeks  PLANNED INTERVENTIONS: Therapeutic exercises, Therapeutic activity, Neuromuscular re-education, Balance training, Gait training, Patient/Family education, Self Care, Joint mobilization, Stair training, Vestibular training, Canalith repositioning, Visual/preceptual remediation/compensation, DME instructions, Dry Needling, Electrical stimulation, Spinal mobilization, Cryotherapy, Moist heat, Manual therapy, and Re-evaluation.  PLAN FOR NEXT SESSION: HEP review and update, manual techniques as appropriate, aerobic tasks, ROM and flexibility activities, strengthening and PREs, TPDN, gait and balance training as needed    Hildred Laser, PT 09/08/2023, 9:14 AM

## 2023-09-11 ENCOUNTER — Ambulatory Visit: Payer: Medicare Other

## 2023-09-11 ENCOUNTER — Other Ambulatory Visit: Payer: Self-pay | Admitting: *Deleted

## 2023-09-11 ENCOUNTER — Other Ambulatory Visit (HOSPITAL_COMMUNITY)
Admission: RE | Admit: 2023-09-11 | Discharge: 2023-09-11 | Disposition: A | Discharge status: Home / Self Care | Payer: Medicare Other | Source: Ambulatory Visit | Attending: Obstetrics and Gynecology | Admitting: Obstetrics and Gynecology

## 2023-09-11 DIAGNOSIS — Z1151 Encounter for screening for human papillomavirus (HPV): Secondary | ICD-10-CM | POA: Diagnosis not present

## 2023-09-11 DIAGNOSIS — I5032 Chronic diastolic (congestive) heart failure: Secondary | ICD-10-CM

## 2023-09-11 DIAGNOSIS — I25119 Atherosclerotic heart disease of native coronary artery with unspecified angina pectoris: Secondary | ICD-10-CM

## 2023-09-11 DIAGNOSIS — I77810 Thoracic aortic ectasia: Secondary | ICD-10-CM

## 2023-09-11 DIAGNOSIS — Z01419 Encounter for gynecological examination (general) (routine) without abnormal findings: Secondary | ICD-10-CM | POA: Insufficient documentation

## 2023-09-11 NOTE — Addendum Note (Signed)
Addended by: Maretta Bees on: 09/11/2023 01:08 PM   Modules accepted: Orders

## 2023-09-12 ENCOUNTER — Encounter (HOSPITAL_COMMUNITY): Payer: Self-pay

## 2023-09-12 ENCOUNTER — Other Ambulatory Visit (HOSPITAL_COMMUNITY): Payer: Medicare Other

## 2023-09-12 ENCOUNTER — Encounter: Payer: Self-pay | Admitting: Obstetrics and Gynecology

## 2023-09-12 LAB — SURGICAL PATHOLOGY

## 2023-09-12 MED ORDER — ESTRADIOL 0.1 MG/GM VA CREA: TOPICAL_CREAM | VAGINAL | 12 refills | Status: AC

## 2023-09-12 NOTE — Addendum Note (Signed)
Addended by: Harvie Bridge on: 09/12/2023 11:21 AM   Modules accepted: Orders

## 2023-09-13 ENCOUNTER — Ambulatory Visit: Payer: Medicare Other

## 2023-09-13 DIAGNOSIS — R2681 Unsteadiness on feet: Secondary | ICD-10-CM | POA: Diagnosis not present

## 2023-09-13 DIAGNOSIS — M6281 Muscle weakness (generalized): Secondary | ICD-10-CM

## 2023-09-13 DIAGNOSIS — M5459 Other low back pain: Secondary | ICD-10-CM

## 2023-09-13 DIAGNOSIS — R296 Repeated falls: Secondary | ICD-10-CM | POA: Diagnosis not present

## 2023-09-13 LAB — CYTOLOGY - PAP
Comment: NEGATIVE
Diagnosis: NEGATIVE
High risk HPV: NEGATIVE

## 2023-09-13 NOTE — Therapy (Signed)
OUTPATIENT PHYSICAL THERAPY TREATMENT NOTE   Patient Name: Tracy [name] MRN: [id number] DOB:1945-12-21, 77 y.o., female Today's Date: 09/13/2023  END OF SESSION:  PT End of Session - 09/13/23 1347     Visit Number 16    Number of Visits 20    Date for PT Re-Evaluation 09/20/23    Authorization Type MCR    PT Start Time 1400    PT Stop Time 1438    PT Time Calculation (min) 38 min                       Past Medical History:  Diagnosis Date   Allergic rhinitis    Allergy    Anxiety    Arthritis    Ascending aorta dilation (HCC) 09/13/2022   Echo 09/12/2022: EF 50-55, GR 1 DD, mildly reduced RVSF, normal PASP (RVSP 17.7), mild dilation of ascending aorta (38 mm), RAP 3   Breast cancer (HCC) 04/28/2020   rigtht breast   Bronchitis    CAD (coronary artery disease)    1/19 PCI/DES to pLCX for ISR, normal EF.    Cancer (HCC)    hx of precancerous cells in right breast    Cataract    Cervical dysplasia    unsure of procedure, possible "burning" in her late 51s   CHF (congestive heart failure) (HCC)    Echo 06/2019: EF 55-60, elevated LVEDP, normal RV SF, mild MAC, mild MR, trivial TR   Complication of anesthesia    COPD (chronic obstructive pulmonary disease) (HCC)    early    Depression    Dyspnea    with exertion    Family history of adverse reaction to anesthesia    daughter- problems wiht n/v   GERD (gastroesophageal reflux disease)    Gout    Headache(784.0)    Heart murmur    hx of years ago    Hyperlipidemia    Hypertension    Low back pain    Menopausal syndrome    Myocardial infarct (HCC) 2007   hx of   Overactive bladder    PONV (postoperative nausea and vomiting)    Sleep apnea    Vaginal Pap smear, abnormal    Past Surgical History:  Procedure Laterality Date   ANGIOPLASTY     stent 2007   BREAST EXCISIONAL BIOPSY Right 04/2020   ADH/LCIS   BREAST LUMPECTOMY WITH RADIOACTIVE SEED LOCALIZATION Right 04/28/2020    Procedure: RIGHT BREAST LUMPECTOMY X 2  WITH RADIOACTIVE SEED LOCALIZATION;  Surgeon: Manus Rudd, MD;  Location: [facility];  Service: General;  Laterality: Right;  LMA   CARDIAC CATHETERIZATION     COLONOSCOPY     CORONARY STENT INTERVENTION N/A 11/06/2017   Procedure: CORONARY STENT INTERVENTION;  Surgeon: Yvonne Kendall, MD;  Location: [facility];  Service: Cardiovascular;  Laterality: N/A;   LEFT HEART CATH AND CORONARY ANGIOGRAPHY N/A 11/06/2017   Procedure: LEFT HEART CATH AND CORONARY ANGIOGRAPHY;  Surgeon: Yvonne Kendall, MD;  Location: [facility];  Service: Cardiovascular;  Laterality: N/A;   LEFT HEART CATH AND CORONARY ANGIOGRAPHY N/A 07/24/2019   Procedure: LEFT HEART CATH AND CORONARY ANGIOGRAPHY;  Surgeon: Kathleene Hazel, MD;  Location: [facility];  Service: Cardiovascular;  Laterality: N/A;   RIGHT/LEFT HEART CATH AND CORONARY ANGIOGRAPHY N/A 09/29/2020   Procedure: RIGHT/LEFT HEART CATH AND CORONARY ANGIOGRAPHY;  Surgeon: Lyn Records, MD;  Location: [facility];  Service:  Cardiovascular;  Laterality: N/A;   ROBOTIC ASSISTED BILATERAL SALPINGO OOPHERECTOMY Bilateral 11/10/2020   Procedure: XI ROBOTIC ASSISTED BILATERAL SALPINGO OOPHORECTOMY WITH MINI LAPAROTOMY FOR DRAINAGE;  Surgeon: Carver Fila, MD;  Location: [facility];  Service: Gynecology;  Laterality: Bilateral;  MINI LAP FIRST   TONSILLECTOMY     VULVECTOMY N/A 11/10/2020   Procedure: WIDE EXCISION VULVECTOMY;  Surgeon: Carver Fila, MD;  Location: [facility];  Service: Gynecology;  Laterality: N/A;   Patient Active Problem List   Diagnosis Date Noted   Hypokalemia 08/07/2023   Intertrigo 04/18/2023   Venous stasis dermatitis of both lower extremities 11/28/2022   Ascending aorta dilation (HCC) 09/13/2022   Dizziness 01/19/2022   Frequent falls 01/19/2022   Tremor of both hands 08/16/2021   Aortic atherosclerosis (HCC) 06/15/2021   Generalized  osteoarthritis of multiple sites 12/25/2020   Chronic pain disorder 12/25/2020   Pelvic mass in female    Bilateral lower extremity edema 04/13/2020   Shortness of breath    Anxiety disorder 10/29/2018   CKD (chronic kidney disease), stage III (HCC) 06/27/2018   COPD (chronic obstructive pulmonary disease) (HCC) 05/25/2018   Obesity 03/20/2018   Dyspnea on exertion 11/06/2017   Abnormal stress test 11/06/2017   Chronic heart failure with preserved ejection fraction (HCC) 09/04/2017   Left ventricular dysfunction 07/31/2017   Cataract, nuclear sclerotic senile, bilateral 02/01/2017   Gouty arthritis of toe of left foot 08/09/2016   Hyperuricemia 08/09/2016   Osteoarthritis (arthritis due to wear and tear of joints) 01/14/2014   Atherosclerosis of native coronary artery of native heart with angina pectoris (HCC) 08/02/2011   Mitral regurgitation 08/02/2011   OVERACTIVE BLADDER 07/02/2010   ACUTE CYSTITIS 05/25/2010   Mixed hyperlipidemia 08/11/2009   Leg edema, left 07/22/2009   GERD 04/11/2008   Constipation 04/11/2008   Essential hypertension 05/30/2007   MYOCARDIAL INFARCTION, HX OF 05/30/2007   Allergic rhinitis 05/30/2007   LOW BACK PAIN 05/30/2007    PCP: [place], [name], MD  REFERRING PROVIDER: [place], [name], MD  REFERRING DIAG: R29.6 (ICD-10-CM) - Frequent falls M54.50,G89.29 (ICD-10-CM) - Chronic low back pain without sciatica, unspecified back pain laterality  Rationale for Evaluation and Treatment: Rehabilitation  THERAPY DIAG:  Other low back pain  Muscle weakness (generalized)  Unsteadiness on feet  ONSET DATE: Chronic  SUBJECTIVE:                                                                                                                                                                                           SUBJECTIVE STATEMENT:  Pt presents to PT with continued LBP and L knee pain,  attributes it to the cold weather. Has been fairly compliant  with HEP.   PERTINENT HISTORY:  See PMH  PAIN:  Are you having pain?  Yes: NPRS scale: 5/10 Pain location: low back Pain description: ache Aggravating factors: prolonged sitting, bending over Relieving factors: pain patches,   PRECAUTIONS: Fall  RED FLAGS: None   WEIGHT BEARING RESTRICTIONS: No  FALLS:  Has patient fallen in last 6 months? Yes. Number of falls 1  LIVING ENVIRONMENT: Lives with: lives with their spouse Lives in: House/apartment Stairs:  avoids Has following equipment at home: Single point cane and Environmental consultant - 4 wheeled  OCCUPATION: retired  PLOF: Independent  PATIENT GOALS: To reduce my fall risk and manage my back pain  NEXT MD VISIT: PRN  OBJECTIVE:   DIAGNOSTIC FINDINGS:  none  PATIENT SURVEYS:  FOTO 19(39 predicted) ; 08/04/23 40  SCREENING FOR RED FLAGS: none  MUSCLE LENGTH: Hamstrings: WFL in sitting Thomas test: not tested  POSTURE: rounded shoulders, forward head, and increased lumbar lordosis  PALPATION: deferred  LUMBAR ROM: deferred due pain and balance deficits  AROM eval  Flexion   Extension   Right lateral flexion   Left lateral flexion   Right rotation   Left rotation    (Blank rows = not tested)  LOWER EXTREMITY ROM:   WFL for gait and transfers  Active  Right eval Left eval  Hip flexion    Hip extension    Hip abduction    Hip adduction    Hip internal rotation    Hip external rotation    Knee flexion    Knee extension    Ankle dorsiflexion    Ankle plantarflexion    Ankle inversion    Ankle eversion     (Blank rows = not tested)  LOWER EXTREMITY MMT:    MMT Right eval Left eval Right 08/14/23 Left 08/14/23  Hip flexion 3+ 3+ 3+   Hip extension 3+ 3+ 4   Hip abduction 3+ 3+ 4   Hip adduction      Hip internal rotation      Hip external rotation      Knee flexion 3+ 3+ 4 4  Knee extension 3+ 3+ 4 4  Ankle dorsiflexion      Ankle plantarflexion 4 4    Ankle inversion      Ankle  eversion       (Blank rows = not tested)  LUMBAR SPECIAL TESTS:  Deferred 07/04/23 Slump test negative B  FUNCTIONAL TESTS:  30 seconds chair stand test 5 reps with BUE support 08/04/23 3 reps due to reports of knee pain/stiffness 08/04/23 2 MWT 128ft mCTSIB position 4 <5s hold 08/04/23 mCTSIB Able to hold all 4 positions for 30s under close S  GAIT: Distance walked: 99ft x2 Assistive device utilized: Environmental consultant - 4 wheeled Level of assistance: Modified independence Comments: slow cadence and flexed posture  TODAY'S TREATMENT:    [facility] Adult PT Treatment:                                                DATE: 09/13/23 Therapeutic Exercise: Nustep L6 x 5 min while taking subjective LAQ 2x10 4# Seated marches 2x20 4# LTR x 10  Supine clamshell 3x15 blue band Supine SLR x 5 each - L difficult Supine ball squeeze 2x10 - 5" hold Seated hamstring  curl 2x10 black band Modalities: MHP to lumbar paraspinals during supine exercises  [facility] Adult PT Treatment:                                                DATE: 09/08/23 Therapeutic Activity: 2 MWT 245ft  30s chair stand test 5 reps Nustep L4 6 min to facilitate shoulder/hip dissociation and promote more fluid gait pattern.   09/08/23 0001  Berg Balance Test  Sit to Stand 3  Standing Unsupported 3  Sitting with Back Unsupported but Feet Supported on Floor or Stool 4  Stand to Sit 4  Transfers 3  Standing Unsupported with Eyes Closed 4  Standing Unsupported with Feet Together 3  From Standing, Reach Forward with Outstretched Arm 3  From Standing Position, Pick up Object from Floor 4  From Standing Position, Turn to Look Behind Over each Shoulder 4  Turn 360 Degrees 2  Standing Unsupported, Alternately Place Feet on Step/Stool 3  Standing Unsupported, One Foot in Front 3  Standing on One Leg 1  Total Score 44     [facility] Adult PT Treatment:                                                DATE: 09/06/23 Therapeutic Exercise: Nustep  L6 x 5 min while taking subjective LAQ 2x10 2# Seated marches 2x20 2# LTR x 10  Supine clamshell 2x15 blue band Supine SLR x 5 each - L difficult STS with eccentric 2x5 - high table Seated hamstring curl 2x10 black band Standing heel raises x 15 Standing hip abd x 5 each - bilat UE support Modalities: MHP to lumbar paraspinals during supine exercises  [facility] Adult PT Treatment:                                                DATE: 08/25/23 Therapeutic Exercise: Nustep L2 8 min Seated p-ball lumbar flexion 10x2 Seated latissimus pressdowns 2s x10 Seate lat pressdowns with alternating knee extension 10/10 Standing heel raises 10x Standing toe raises 10x  [facility] Adult PT Treatment:                                                DATE: 08/23/23 Therapeutic Exercise: Nustep L5 UE/LE x 5 min while taking subjective Supine QS x 10 each Supine SLR 2x5 each Supine ball squeeze 2x10 - 5" hold Supine clamshell 2x15 blue band SAQ 2x15 2# LAQ 2x15 2# STS 2x5 - medium table, trying to minimize UE support Modalities: MHP to lumbar paraspinals during supine exercises  [facility] Adult PT Treatment:                                                DATE: 08/14/23 Therapeutic Exercise: Nustep L5 UE/LE x 5 min while taking subjective Su[ine QS x 10  each Supine SLR x 10 each Supine ball squeeze 2x10 - 5" hold SAQ 2x15 2# Bridge 2x10 (small range, difficult) LTR x 10 Supine clamshell 2x15 blue band Supine march 2x20 blue band LAQ 2x15 Modalities: MHP to lumbar paraspinals during supine exercises  [facility] Adult PT Treatment:                                                DATE: 08/10/23 Therapeutic Exercise: Nustep L5 UE/LE x 5 min while taking subjective LTR x 10 Supine clamshell 2x15 GTB Supine march 2x20 GTB Bridge 2x10 (small range) Supine ball squeeze 2x10  LAQ 2x15 STS with eccentric control 2x5 Modalities: MHP to lumbar paraspinals during supine exercises  [facility] Adult PT Treatment:                                                 DATE: 08/08/23 Therapeutic Exercise: Nustep L5 UE/LE x 5 min while taking subjective LTR x 10 Supine clamshell 2x10 GTB Supine march 2x20 GTB Bridge 2x10 (small range) Supine ball squeeze 2x10  Supine QS x 10 each - 5" hold LAQ 2x15 Modalities: MHP to lumbar paraspinals during supine exercises  PATIENT EDUCATION:  Education details: Discussed eval findings, rehab rationale and POC and patient is in agreement  Person educated: Patient Education method: Explanation Education comprehension: verbalized understanding and needs further education  HOME EXERCISE PROGRAM: Access Code: [email or web address] URL: [email or web address] Date: 06/27/2023 Prepared by: Gustavus Bryant  Exercises - Sit to Stand with Armchair  - 2 x daily - 5 x weekly - 1 sets - 5 reps - Heel Toe Raises with Counter Support  - 2 x daily - 5 x weekly - 2 sets - 10 reps - Standing [employer] at [employer]  - 2 x daily - 5 x weekly - 2 sets - 10 reps  ASSESSMENT:  CLINICAL IMPRESSION:  Pt was able to complete prescribed exercises with no adverse effect. Therapy focused on improving LE strength today for improving mobility and decreasing pain. Will continue to progress as able per POC.   EVAL: Patient is a 77 y.o. female who was seen today for physical therapy evaluation and treatment for low back pain and frequent falls. She presents with LE strength deficits as well as diminished balance in vision removed and compliant surface environments.  5x STS time demonstrates functional strength deficits and further balance testing needed to identify specific regions of balance dysfunction.  Suspected underlying BPPV may need to be addressed.    OBJECTIVE IMPAIRMENTS: Abnormal gait, decreased activity tolerance, decreased balance, decreased knowledge of condition, decreased mobility, difficulty walking, decreased strength, decreased safety awareness, dizziness, postural dysfunction, and  obesity.   ACTIVITY LIMITATIONS: carrying, lifting, standing, stairs, and locomotion level  PERSONAL FACTORS: Age, Past/current experiences, and Time since onset of injury/illness/exacerbation are also affecting patient's functional outcome.   REHAB POTENTIAL: Good  CLINICAL DECISION MAKING: Evolving/moderate complexity  EVALUATION COMPLEXITY: Moderate   GOALS: Goals reviewed with patient? No  SHORT TERM GOALS: Target date: 07/25/2023  Patient to demonstrate independence in HEP  Baseline: [email or web address] Goal status: MET  2.  Assess 2 MWT and establish baseline Baseline: TBD; 07/04/23 156ft with rollator Goal status: MET  3.  Increase reps to  7 on 30s chair stand test Baseline: 5 reps 08/04/23: 3 due to B knee pain 08/14/23: 6 reps Goal status: IN PROGRESS  LONG TERM GOALS: Target date: 08/22/2023  Increase B LE strength to 4-/5 Baseline: See MMT chart Goal status: IN PROGRESS  2.  Increase FOTO score to 39 Baseline: 19;  08/04/23: 40 Goal status: MET  3.  Increase time on position 4 of mCTSIB to 20s Baseline: <5s; 08/04/23 mCTSIB Able to hold all 4 positions for 30s under close S Goal status: MET  4.  Re-asess 2 MWT and note gains Baseline: TBD; 07/04/23 162ft with rollator; 08/04/23 2 MWT 126ft; 09/08/23 241ft Goal status: MET   PLAN:  PT FREQUENCY: 1-2x/week  PT DURATION: 6 weeks  PLANNED INTERVENTIONS: Therapeutic exercises, Therapeutic activity, Neuromuscular re-education, Balance training, Gait training, Patient/Family education, Self Care, Joint mobilization, Stair training, Vestibular training, Canalith repositioning, Visual/preceptual remediation/compensation, DME instructions, Dry Needling, Electrical stimulation, Spinal mobilization, Cryotherapy, Moist heat, Manual therapy, and Re-evaluation.  PLAN FOR NEXT SESSION: HEP review and update, manual techniques as appropriate, aerobic tasks, ROM and flexibility activities, strengthening and PREs, TPDN, gait and  balance training as needed    Eloy End, PT 09/13/2023, 2:44 PM

## 2023-09-19 ENCOUNTER — Ambulatory Visit: Payer: Medicare Other

## 2023-09-19 NOTE — Therapy (Incomplete)
OUTPATIENT PHYSICAL THERAPY TREATMENT NOTE   Patient Name: Tracy [name] MRN: [id number] DOB:07/14/1946, 77 y.o., female Today's Date: 09/19/2023  END OF SESSION:              Past Medical History:  Diagnosis Date   Allergic rhinitis    Allergy    Anxiety    Arthritis    Ascending aorta dilation (HCC) 09/13/2022   Echo 09/12/2022: EF 50-55, GR 1 DD, mildly reduced RVSF, normal PASP (RVSP 17.7), mild dilation of ascending aorta (38 mm), RAP 3   Breast cancer (HCC) 04/28/2020   rigtht breast   Bronchitis    CAD (coronary artery disease)    1/19 PCI/DES to pLCX for ISR, normal EF.    Cancer (HCC)    hx of precancerous cells in right breast    Cataract    Cervical dysplasia    unsure of procedure, possible "burning" in her late 32s   CHF (congestive heart failure) (HCC)    Echo 06/2019: EF 55-60, elevated LVEDP, normal RV SF, mild MAC, mild MR, trivial TR   Complication of anesthesia    COPD (chronic obstructive pulmonary disease) (HCC)    early    Depression    Dyspnea    with exertion    Family history of adverse reaction to anesthesia    daughter- problems wiht n/v   GERD (gastroesophageal reflux disease)    Gout    Headache(784.0)    Heart murmur    hx of years ago    Hyperlipidemia    Hypertension    Low back pain    Menopausal syndrome    Myocardial infarct (HCC) 2007   hx of   Overactive bladder    PONV (postoperative nausea and vomiting)    Sleep apnea    Vaginal Pap smear, abnormal    Past Surgical History:  Procedure Laterality Date   ANGIOPLASTY     stent 2007   BREAST EXCISIONAL BIOPSY Right 04/2020   ADH/LCIS   BREAST LUMPECTOMY WITH RADIOACTIVE SEED LOCALIZATION Right 04/28/2020   Procedure: RIGHT BREAST LUMPECTOMY X 2  WITH RADIOACTIVE SEED LOCALIZATION;  Surgeon: Manus Rudd, MD;  Location: [facility];  Service: General;  Laterality: Right;  LMA   CARDIAC CATHETERIZATION     COLONOSCOPY     CORONARY STENT  INTERVENTION N/A 11/06/2017   Procedure: CORONARY STENT INTERVENTION;  Surgeon: Yvonne Kendall, MD;  Location: [facility];  Service: Cardiovascular;  Laterality: N/A;   LEFT HEART CATH AND CORONARY ANGIOGRAPHY N/A 11/06/2017   Procedure: LEFT HEART CATH AND CORONARY ANGIOGRAPHY;  Surgeon: Yvonne Kendall, MD;  Location: [facility];  Service: Cardiovascular;  Laterality: N/A;   LEFT HEART CATH AND CORONARY ANGIOGRAPHY N/A 07/24/2019   Procedure: LEFT HEART CATH AND CORONARY ANGIOGRAPHY;  Surgeon: Kathleene Hazel, MD;  Location: [facility];  Service: Cardiovascular;  Laterality: N/A;   RIGHT/LEFT HEART CATH AND CORONARY ANGIOGRAPHY N/A 09/29/2020   Procedure: RIGHT/LEFT HEART CATH AND CORONARY ANGIOGRAPHY;  Surgeon: Lyn Records, MD;  Location: [facility];  Service: Cardiovascular;  Laterality: N/A;   ROBOTIC ASSISTED BILATERAL SALPINGO OOPHERECTOMY Bilateral 11/10/2020   Procedure: XI ROBOTIC ASSISTED BILATERAL SALPINGO OOPHORECTOMY WITH MINI LAPAROTOMY FOR DRAINAGE;  Surgeon: Carver Fila, MD;  Location: [facility];  Service: Gynecology;  Laterality: Bilateral;  MINI LAP FIRST   TONSILLECTOMY     VULVECTOMY N/A 11/10/2020   Procedure: WIDE EXCISION VULVECTOMY;  Surgeon: Carver Fila,  MD;  Location: [facility];  Service: Gynecology;  Laterality: N/A;   Patient Active Problem List   Diagnosis Date Noted   Hypokalemia 08/07/2023   Intertrigo 04/18/2023   Venous stasis dermatitis of both lower extremities 11/28/2022   Ascending aorta dilation (HCC) 09/13/2022   Dizziness 01/19/2022   Frequent falls 01/19/2022   Tremor of both hands 08/16/2021   Aortic atherosclerosis (HCC) 06/15/2021   Generalized osteoarthritis of multiple sites 12/25/2020   Chronic pain disorder 12/25/2020   Pelvic mass in female    Bilateral lower extremity edema 04/13/2020   Shortness of breath    Anxiety disorder 10/29/2018   CKD (chronic kidney disease), stage III  (HCC) 06/27/2018   COPD (chronic obstructive pulmonary disease) (HCC) 05/25/2018   Obesity 03/20/2018   Dyspnea on exertion 11/06/2017   Abnormal stress test 11/06/2017   Chronic heart failure with preserved ejection fraction (HCC) 09/04/2017   Left ventricular dysfunction 07/31/2017   Cataract, nuclear sclerotic senile, bilateral 02/01/2017   Gouty arthritis of toe of left foot 08/09/2016   Hyperuricemia 08/09/2016   Osteoarthritis (arthritis due to wear and tear of joints) 01/14/2014   Atherosclerosis of native coronary artery of native heart with angina pectoris (HCC) 08/02/2011   Mitral regurgitation 08/02/2011   OVERACTIVE BLADDER 07/02/2010   ACUTE CYSTITIS 05/25/2010   Mixed hyperlipidemia 08/11/2009   Leg edema, left 07/22/2009   GERD 04/11/2008   Constipation 04/11/2008   Essential hypertension 05/30/2007   MYOCARDIAL INFARCTION, HX OF 05/30/2007   Allergic rhinitis 05/30/2007   LOW BACK PAIN 05/30/2007    PCP: [place], [name], MD  REFERRING PROVIDER: [place], [name], MD  REFERRING DIAG: R29.6 (ICD-10-CM) - Frequent falls M54.50,G89.29 (ICD-10-CM) - Chronic low back pain without sciatica, unspecified back pain laterality  Rationale for Evaluation and Treatment: Rehabilitation  THERAPY DIAG:  No diagnosis found.  ONSET DATE: Chronic  SUBJECTIVE:                                                                                                                                                                                           SUBJECTIVE STATEMENT:  *** Pt presents to PT with continued LBP and L knee pain, attributes it to the cold weather. Has been fairly compliant with HEP.   PERTINENT HISTORY:  See PMH  PAIN:  Are you having pain?  Yes: NPRS scale: 5/10 Pain location: low back Pain description: ache Aggravating factors: prolonged sitting, bending over Relieving factors: pain patches,   PRECAUTIONS: Fall  RED FLAGS: None   WEIGHT BEARING  RESTRICTIONS: No  FALLS:  Has patient fallen in last 6 months? Yes. Number of  falls 1  LIVING ENVIRONMENT: Lives with: lives with their spouse Lives in: House/apartment Stairs:  avoids Has following equipment at home: Single point cane and Environmental consultant - 4 wheeled  OCCUPATION: retired  PLOF: Independent  PATIENT GOALS: To reduce my fall risk and manage my back pain  NEXT MD VISIT: PRN  OBJECTIVE:   DIAGNOSTIC FINDINGS:  none  PATIENT SURVEYS:  FOTO 19(39 predicted) ; 08/04/23 40  SCREENING FOR RED FLAGS: none  MUSCLE LENGTH: Hamstrings: WFL in sitting Thomas test: not tested  POSTURE: rounded shoulders, forward head, and increased lumbar lordosis  PALPATION: deferred  LUMBAR ROM: deferred due pain and balance deficits  AROM eval  Flexion   Extension   Right lateral flexion   Left lateral flexion   Right rotation   Left rotation    (Blank rows = not tested)  LOWER EXTREMITY ROM:   WFL for gait and transfers  Active  Right eval Left eval  Hip flexion    Hip extension    Hip abduction    Hip adduction    Hip internal rotation    Hip external rotation    Knee flexion    Knee extension    Ankle dorsiflexion    Ankle plantarflexion    Ankle inversion    Ankle eversion     (Blank rows = not tested)  LOWER EXTREMITY MMT:    MMT Right eval Left eval Right 08/14/23 Left 08/14/23  Hip flexion 3+ 3+ 3+   Hip extension 3+ 3+ 4   Hip abduction 3+ 3+ 4   Hip adduction      Hip internal rotation      Hip external rotation      Knee flexion 3+ 3+ 4 4  Knee extension 3+ 3+ 4 4  Ankle dorsiflexion      Ankle plantarflexion 4 4    Ankle inversion      Ankle eversion       (Blank rows = not tested)  LUMBAR SPECIAL TESTS:  Deferred 07/04/23 Slump test negative B  FUNCTIONAL TESTS:  30 seconds chair stand test 5 reps with BUE support 08/04/23 3 reps due to reports of knee pain/stiffness 08/04/23 2 MWT 152ft mCTSIB position 4 <5s hold 08/04/23  mCTSIB Able to hold all 4 positions for 30s under close S  GAIT: Distance walked: 55ft x2 Assistive device utilized: Environmental consultant - 4 wheeled Level of assistance: Modified independence Comments: slow cadence and flexed posture  TODAY'S TREATMENT:    [facility] Adult PT Treatment:                                                DATE: 09/19/23 Therapeutic Exercise: Nustep L6 x 5 min while taking subjective Heel/toe raises  Standing hip abduction Standing marching LAQ 2x10 4# Seated marches 2x20 4# LTR x 10  Supine clamshell 3x15 blue band Supine SLR x 5 each - L difficult Supine ball squeeze 2x10 - 5" hold Seated hamstring curl 2x10 black band Modalities: MHP to lumbar paraspinals during supine exercises   [facility] Adult PT Treatment:                                                DATE: 09/13/23 Therapeutic  Exercise: Nustep L6 x 5 min while taking subjective LAQ 2x10 4# Seated marches 2x20 4# LTR x 10  Supine clamshell 3x15 blue band Supine SLR x 5 each - L difficult Supine ball squeeze 2x10 - 5" hold Seated hamstring curl 2x10 black band Modalities: MHP to lumbar paraspinals during supine exercises  [facility] Adult PT Treatment:                                                DATE: 09/08/23 Therapeutic Activity: 2 MWT 222ft  30s chair stand test 5 reps Nustep L4 6 min to facilitate shoulder/hip dissociation and promote more fluid gait pattern.   09/08/23 0001  Berg Balance Test  Sit to Stand 3  Standing Unsupported 3  Sitting with Back Unsupported but Feet Supported on Floor or Stool 4  Stand to Sit 4  Transfers 3  Standing Unsupported with Eyes Closed 4  Standing Unsupported with Feet Together 3  From Standing, Reach Forward with Outstretched Arm 3  From Standing Position, Pick up Object from Floor 4  From Standing Position, Turn to Look Behind Over each Shoulder 4  Turn 360 Degrees 2  Standing Unsupported, Alternately Place Feet on Step/Stool 3  Standing Unsupported, One Foot in  Front 3  Standing on One Leg 1  Total Score 44      PATIENT EDUCATION:  Education details: Discussed eval findings, rehab rationale and POC and patient is in agreement  Person educated: Patient Education method: Explanation Education comprehension: verbalized understanding and needs further education  HOME EXERCISE PROGRAM: Access Code: [email or web address] URL: [email or web address] Date: 06/27/2023 Prepared by: Gustavus Bryant  Exercises - Sit to Stand with Armchair  - 2 x daily - 5 x weekly - 1 sets - 5 reps - Heel Toe Raises with Counter Support  - 2 x daily - 5 x weekly - 2 sets - 10 reps - Standing [employer] at [employer]  - 2 x daily - 5 x weekly - 2 sets - 10 reps  ASSESSMENT:  CLINICAL IMPRESSION:  ***  Pt was able to complete prescribed exercises with no adverse effect. Therapy focused on improving LE strength today for improving mobility and decreasing pain. Will continue to progress as able per POC.   EVAL: Patient is a 77 y.o. female who was seen today for physical therapy evaluation and treatment for low back pain and frequent falls. She presents with LE strength deficits as well as diminished balance in vision removed and compliant surface environments.  5x STS time demonstrates functional strength deficits and further balance testing needed to identify specific regions of balance dysfunction.  Suspected underlying BPPV may need to be addressed.    OBJECTIVE IMPAIRMENTS: Abnormal gait, decreased activity tolerance, decreased balance, decreased knowledge of condition, decreased mobility, difficulty walking, decreased strength, decreased safety awareness, dizziness, postural dysfunction, and obesity.   ACTIVITY LIMITATIONS: carrying, lifting, standing, stairs, and locomotion level  PERSONAL FACTORS: Age, Past/current experiences, and Time since onset of injury/illness/exacerbation are also affecting patient's functional outcome.   REHAB POTENTIAL:  Good  CLINICAL DECISION MAKING: Evolving/moderate complexity  EVALUATION COMPLEXITY: Moderate   GOALS: Goals reviewed with patient? No  SHORT TERM GOALS: Target date: 07/25/2023  Patient to demonstrate independence in HEP  Baseline: [email or web address] Goal status: MET  2.  Assess 2 MWT and establish baseline Baseline: TBD; 07/04/23 16ft with rollator Goal  status: MET  3.  Increase reps to 7 on 30s chair stand test Baseline: 5 reps 08/04/23: 3 due to B knee pain 08/14/23: 6 reps Goal status: IN PROGRESS  LONG TERM GOALS: Target date: 08/22/2023  Increase B LE strength to 4-/5 Baseline: See MMT chart Goal status: IN PROGRESS  2.  Increase FOTO score to 39 Baseline: 19;  08/04/23: 40 Goal status: MET  3.  Increase time on position 4 of mCTSIB to 20s Baseline: <5s; 08/04/23 mCTSIB Able to hold all 4 positions for 30s under close S Goal status: MET  4.  Re-asess 2 MWT and note gains Baseline: TBD; 07/04/23 13ft with rollator; 08/04/23 2 MWT 143ft; 09/08/23 22ft Goal status: MET   PLAN:  PT FREQUENCY: 1-2x/week  PT DURATION: 6 weeks  PLANNED INTERVENTIONS: Therapeutic exercises, Therapeutic activity, Neuromuscular re-education, Balance training, Gait training, Patient/Family education, Self Care, Joint mobilization, Stair training, Vestibular training, Canalith repositioning, Visual/preceptual remediation/compensation, DME instructions, Dry Needling, Electrical stimulation, Spinal mobilization, Cryotherapy, Moist heat, Manual therapy, and Re-evaluation.  PLAN FOR NEXT SESSION: HEP review and update, manual techniques as appropriate, aerobic tasks, ROM and flexibility activities, strengthening and PREs, TPDN, gait and balance training as needed    Berta Minor, PTA 09/19/2023, 8:21 AM

## 2023-09-20 NOTE — Therapy (Unsigned)
OUTPATIENT PHYSICAL THERAPY TREATMENT NOTE   Patient Name: Tracy [name] MRN: [id number] DOB:1946/03/21, 77 y.o., female Today's Date: 09/21/2023  END OF SESSION:  PT End of Session - 09/21/23 1312     Visit Number 17    Number of Visits 20    Date for PT Re-Evaluation 09/20/23    Authorization Type MCR    PT Start Time 1315    PT Stop Time 1355    PT Time Calculation (min) 40 min    Activity Tolerance Patient tolerated treatment well    Behavior During Therapy [facility] for tasks assessed/performed            Past Medical History:  Diagnosis Date   Allergic rhinitis    Allergy    Anxiety    Arthritis    Ascending aorta dilation (HCC) 09/13/2022   Echo 09/12/2022: EF 50-55, GR 1 DD, mildly reduced RVSF, normal PASP (RVSP 17.7), mild dilation of ascending aorta (38 mm), RAP 3   Breast cancer (HCC) 04/28/2020   rigtht breast   Bronchitis    CAD (coronary artery disease)    1/19 PCI/DES to pLCX for ISR, normal EF.    Cancer (HCC)    hx of precancerous cells in right breast    Cataract    Cervical dysplasia    unsure of procedure, possible "burning" in her late 3s   CHF (congestive heart failure) (HCC)    Echo 06/2019: EF 55-60, elevated LVEDP, normal RV SF, mild MAC, mild MR, trivial TR   Complication of anesthesia    COPD (chronic obstructive pulmonary disease) (HCC)    early    Depression    Dyspnea    with exertion    Family history of adverse reaction to anesthesia    daughter- problems wiht n/v   GERD (gastroesophageal reflux disease)    Gout    Headache(784.0)    Heart murmur    hx of years ago    Hyperlipidemia    Hypertension    Low back pain    Menopausal syndrome    Myocardial infarct (HCC) 2007   hx of   Overactive bladder    PONV (postoperative nausea and vomiting)    Sleep apnea    Vaginal Pap smear, abnormal    Past Surgical History:  Procedure Laterality Date   ANGIOPLASTY     stent 2007   BREAST EXCISIONAL BIOPSY Right 04/2020    ADH/LCIS   BREAST LUMPECTOMY WITH RADIOACTIVE SEED LOCALIZATION Right 04/28/2020   Procedure: RIGHT BREAST LUMPECTOMY X 2  WITH RADIOACTIVE SEED LOCALIZATION;  Surgeon: Manus Rudd, MD;  Location: [facility];  Service: General;  Laterality: Right;  LMA   CARDIAC CATHETERIZATION     COLONOSCOPY     CORONARY STENT INTERVENTION N/A 11/06/2017   Procedure: CORONARY STENT INTERVENTION;  Surgeon: Yvonne Kendall, MD;  Location: [facility];  Service: Cardiovascular;  Laterality: N/A;   LEFT HEART CATH AND CORONARY ANGIOGRAPHY N/A 11/06/2017   Procedure: LEFT HEART CATH AND CORONARY ANGIOGRAPHY;  Surgeon: Yvonne Kendall, MD;  Location: [facility];  Service: Cardiovascular;  Laterality: N/A;   LEFT HEART CATH AND CORONARY ANGIOGRAPHY N/A 07/24/2019   Procedure: LEFT HEART CATH AND CORONARY ANGIOGRAPHY;  Surgeon: Kathleene Hazel, MD;  Location: [facility];  Service: Cardiovascular;  Laterality: N/A;   RIGHT/LEFT HEART CATH AND CORONARY ANGIOGRAPHY N/A 09/29/2020   Procedure: RIGHT/LEFT HEART CATH AND CORONARY ANGIOGRAPHY;  Surgeon: Lyn Records, MD;  Location: [facility];  Service: Cardiovascular;  Laterality: N/A;   ROBOTIC ASSISTED BILATERAL SALPINGO OOPHERECTOMY Bilateral 11/10/2020   Procedure: XI ROBOTIC ASSISTED BILATERAL SALPINGO OOPHORECTOMY WITH MINI LAPAROTOMY FOR DRAINAGE;  Surgeon: Carver Fila, MD;  Location: [facility];  Service: Gynecology;  Laterality: Bilateral;  MINI LAP FIRST   TONSILLECTOMY     VULVECTOMY N/A 11/10/2020   Procedure: WIDE EXCISION VULVECTOMY;  Surgeon: Carver Fila, MD;  Location: [facility];  Service: Gynecology;  Laterality: N/A;   Patient Active Problem List   Diagnosis Date Noted   Hypokalemia 08/07/2023   Intertrigo 04/18/2023   Venous stasis dermatitis of both lower extremities 11/28/2022   Ascending aorta dilation (HCC) 09/13/2022   Dizziness 01/19/2022   Frequent falls 01/19/2022   Tremor  of both hands 08/16/2021   Aortic atherosclerosis (HCC) 06/15/2021   Generalized osteoarthritis of multiple sites 12/25/2020   Chronic pain disorder 12/25/2020   Pelvic mass in female    Bilateral lower extremity edema 04/13/2020   Shortness of breath    Anxiety disorder 10/29/2018   CKD (chronic kidney disease), stage III (HCC) 06/27/2018   COPD (chronic obstructive pulmonary disease) (HCC) 05/25/2018   Obesity 03/20/2018   Dyspnea on exertion 11/06/2017   Abnormal stress test 11/06/2017   Chronic heart failure with preserved ejection fraction (HCC) 09/04/2017   Left ventricular dysfunction 07/31/2017   Cataract, nuclear sclerotic senile, bilateral 02/01/2017   Gouty arthritis of toe of left foot 08/09/2016   Hyperuricemia 08/09/2016   Osteoarthritis (arthritis due to wear and tear of joints) 01/14/2014   Atherosclerosis of native coronary artery of native heart with angina pectoris (HCC) 08/02/2011   Mitral regurgitation 08/02/2011   OVERACTIVE BLADDER 07/02/2010   ACUTE CYSTITIS 05/25/2010   Mixed hyperlipidemia 08/11/2009   Leg edema, left 07/22/2009   GERD 04/11/2008   Constipation 04/11/2008   Essential hypertension 05/30/2007   MYOCARDIAL INFARCTION, HX OF 05/30/2007   Allergic rhinitis 05/30/2007   LOW BACK PAIN 05/30/2007    PCP: [place], [name], MD  REFERRING PROVIDER: [place], [name], MD  REFERRING DIAG: R29.6 (ICD-10-CM) - Frequent falls M54.50,G89.29 (ICD-10-CM) - Chronic low back pain without sciatica, unspecified back pain laterality  Rationale for Evaluation and Treatment: Rehabilitation  THERAPY DIAG:  Other low back pain  Muscle weakness (generalized)  Unsteadiness on feet  ONSET DATE: Chronic  SUBJECTIVE:                                                                                                                                                                                           SUBJECTIVE STATEMENT: Feels she is more steady  on her feet  but still has episodes of balance loss.  PERTINENT HISTORY:  See PMH  PAIN:  Are you having pain?  Yes: NPRS scale: 5/10 Pain location: low back Pain description: ache Aggravating factors: prolonged sitting, bending over Relieving factors: pain patches,   PRECAUTIONS: Fall  RED FLAGS: None   WEIGHT BEARING RESTRICTIONS: No  FALLS:  Has patient fallen in last 6 months? Yes. Number of falls 1  LIVING ENVIRONMENT: Lives with: lives with their spouse Lives in: House/apartment Stairs:  avoids Has following equipment at home: Single point cane and Environmental consultant - 4 wheeled  OCCUPATION: retired  PLOF: Independent  PATIENT GOALS: To reduce my fall risk and manage my back pain  NEXT MD VISIT: PRN  OBJECTIVE:   DIAGNOSTIC FINDINGS:  none  PATIENT SURVEYS:  FOTO 19(39 predicted) ; 08/04/23 40  SCREENING FOR RED FLAGS: none  MUSCLE LENGTH: Hamstrings: WFL in sitting Thomas test: not tested  POSTURE: rounded shoulders, forward head, and increased lumbar lordosis  PALPATION: deferred  LUMBAR ROM: deferred due pain and balance deficits  AROM eval  Flexion   Extension   Right lateral flexion   Left lateral flexion   Right rotation   Left rotation    (Blank rows = not tested)  LOWER EXTREMITY ROM:   WFL for gait and transfers  Active  Right eval Left eval  Hip flexion    Hip extension    Hip abduction    Hip adduction    Hip internal rotation    Hip external rotation    Knee flexion    Knee extension    Ankle dorsiflexion    Ankle plantarflexion    Ankle inversion    Ankle eversion     (Blank rows = not tested)  LOWER EXTREMITY MMT:    MMT Right eval Left eval Right 08/14/23 Left 08/14/23  Hip flexion 3+ 3+ 3+   Hip extension 3+ 3+ 4   Hip abduction 3+ 3+ 4   Hip adduction      Hip internal rotation      Hip external rotation      Knee flexion 3+ 3+ 4 4  Knee extension 3+ 3+ 4 4  Ankle dorsiflexion      Ankle plantarflexion 4 4    Ankle  inversion      Ankle eversion       (Blank rows = not tested)  LUMBAR SPECIAL TESTS:  Deferred 07/04/23 Slump test negative B  FUNCTIONAL TESTS:  30 seconds chair stand test 5 reps with BUE support 08/04/23 3 reps due to reports of knee pain/stiffness 08/04/23 2 MWT 125ft mCTSIB position 4 <5s hold 08/04/23 mCTSIB Able to hold all 4 positions for 30s under close S  GAIT: Distance walked: 6ft x2 Assistive device utilized: Environmental consultant - 4 wheeled Level of assistance: Modified independence Comments: slow cadence and flexed posture  TODAY'S TREATMENT:    [facility] Adult PT Treatment:                                                DATE: 09/21/23 Therapeutic Exercise: Nustep L3 8 min  Neuromuscular re-ed: Fwd/bwd walking in // bars, single UE support only 1 trip each Tandem standing on 4 in block w/o UE support, alt 5/5 Unsupported standing with random alternating BUE reaching, 60s on each side Transfer training emphasizing  body mechanics and use of armrests when asending/descending   [facility] Adult PT Treatment:                                                DATE: 09/19/23 Therapeutic Exercise: Nustep L6 x 5 min while taking subjective Heel/toe raises  Standing hip abduction Standing marching LAQ 2x10 4# Seated marches 2x20 4# LTR x 10  Supine clamshell 3x15 blue band Supine SLR x 5 each - L difficult Supine ball squeeze 2x10 - 5" hold Seated hamstring curl 2x10 black band Modalities: MHP to lumbar paraspinals during supine exercises   [facility] Adult PT Treatment:                                                DATE: 09/13/23 Therapeutic Exercise: Nustep L6 x 5 min while taking subjective LAQ 2x10 4# Seated marches 2x20 4# LTR x 10  Supine clamshell 3x15 blue band Supine SLR x 5 each - L difficult Supine ball squeeze 2x10 - 5" hold Seated hamstring curl 2x10 black band Modalities: MHP to lumbar paraspinals during supine exercises  [facility] Adult PT Treatment:                                                 DATE: 09/08/23 Therapeutic Activity: 2 MWT 275ft  30s chair stand test 5 reps Nustep L4 6 min to facilitate shoulder/hip dissociation and promote more fluid gait pattern.   09/08/23 0001  Berg Balance Test  Sit to Stand 3  Standing Unsupported 3  Sitting with Back Unsupported but Feet Supported on Floor or Stool 4  Stand to Sit 4  Transfers 3  Standing Unsupported with Eyes Closed 4  Standing Unsupported with Feet Together 3  From Standing, Reach Forward with Outstretched Arm 3  From Standing Position, Pick up Object from Floor 4  From Standing Position, Turn to Look Behind Over each Shoulder 4  Turn 360 Degrees 2  Standing Unsupported, Alternately Place Feet on Step/Stool 3  Standing Unsupported, One Foot in Front 3  Standing on One Leg 1  Total Score 44      PATIENT EDUCATION:  Education details: Discussed eval findings, rehab rationale and POC and patient is in agreement  Person educated: Patient Education method: Explanation Education comprehension: verbalized understanding and needs further education  HOME EXERCISE PROGRAM: Access Code: [email or web address] URL: [email or web address] Date: 06/27/2023 Prepared by: Gustavus Bryant  Exercises - Sit to Stand with Armchair  - 2 x daily - 5 x weekly - 1 sets - 5 reps - Heel Toe Raises with Counter Support  - 2 x daily - 5 x weekly - 2 sets - 10 reps - Standing [employer] at [employer]  - 2 x daily - 5 x weekly - 2 sets - 10 reps  ASSESSMENT:  CLINICAL IMPRESSION: Discussed need to mobilize more at home as she spends her day sitting. Session today focused on balance and proprioceptive training trying to wean patient from unnecessary UE support to challenge balance and righting reactions.     EVAL: Patient is a 77 y.o.  female who was seen today for physical therapy evaluation and treatment for low back pain and frequent falls. She presents with LE strength deficits as well as diminished balance in  vision removed and compliant surface environments.  5x STS time demonstrates functional strength deficits and further balance testing needed to identify specific regions of balance dysfunction.  Suspected underlying BPPV may need to be addressed.    OBJECTIVE IMPAIRMENTS: Abnormal gait, decreased activity tolerance, decreased balance, decreased knowledge of condition, decreased mobility, difficulty walking, decreased strength, decreased safety awareness, dizziness, postural dysfunction, and obesity.   ACTIVITY LIMITATIONS: carrying, lifting, standing, stairs, and locomotion level  PERSONAL FACTORS: Age, Past/current experiences, and Time since onset of injury/illness/exacerbation are also affecting patient's functional outcome.   REHAB POTENTIAL: Good  CLINICAL DECISION MAKING: Evolving/moderate complexity  EVALUATION COMPLEXITY: Moderate   GOALS: Goals reviewed with patient? No  SHORT TERM GOALS: Target date: 07/25/2023  Patient to demonstrate independence in HEP  Baseline: [email or web address] Goal status: MET  2.  Assess 2 MWT and establish baseline Baseline: TBD; 07/04/23 148ft with rollator Goal status: MET  3.  Increase reps to 7 on 30s chair stand test Baseline: 5 reps 08/04/23: 3 due to B knee pain 08/14/23: 6 reps Goal status: IN PROGRESS  LONG TERM GOALS: Target date: 08/22/2023  Increase B LE strength to 4-/5 Baseline: See MMT chart Goal status: IN PROGRESS  2.  Increase FOTO score to 39 Baseline: 19;  08/04/23: 40 Goal status: MET  3.  Increase time on position 4 of mCTSIB to 20s Baseline: <5s; 08/04/23 mCTSIB Able to hold all 4 positions for 30s under close S Goal status: MET  4.  Re-asess 2 MWT and note gains Baseline: TBD; 07/04/23 132ft with rollator; 08/04/23 2 MWT 169ft; 09/08/23 21ft Goal status: MET   PLAN:  PT FREQUENCY: 1-2x/week  PT DURATION: 6 weeks  PLANNED INTERVENTIONS: Therapeutic exercises, Therapeutic activity, Neuromuscular re-education,  Balance training, Gait training, Patient/Family education, Self Care, Joint mobilization, Stair training, Vestibular training, Canalith repositioning, Visual/preceptual remediation/compensation, DME instructions, Dry Needling, Electrical stimulation, Spinal mobilization, Cryotherapy, Moist heat, Manual therapy, and Re-evaluation.  PLAN FOR NEXT SESSION: HEP review and update, manual techniques as appropriate, aerobic tasks, ROM and flexibility activities, strengthening and PREs, TPDN, gait and balance training as needed    Hildred Laser, PT 09/21/2023, 2:50 PM

## 2023-09-21 ENCOUNTER — Ambulatory Visit: Payer: Medicare Other | Attending: Family Medicine

## 2023-09-21 DIAGNOSIS — M5459 Other low back pain: Secondary | ICD-10-CM | POA: Insufficient documentation

## 2023-09-21 DIAGNOSIS — R2681 Unsteadiness on feet: Secondary | ICD-10-CM | POA: Insufficient documentation

## 2023-09-21 DIAGNOSIS — M6281 Muscle weakness (generalized): Secondary | ICD-10-CM | POA: Insufficient documentation

## 2023-09-22 NOTE — Therapy (Unsigned)
OUTPATIENT PHYSICAL THERAPY TREATMENT NOTE   Patient Name: Tracy [name] MRN: [id number] DOB:1946-01-12, 77 y.o., female Today's Date: 09/22/2023  END OF SESSION:   Past Medical History:  Diagnosis Date   Allergic rhinitis    Allergy    Anxiety    Arthritis    Ascending aorta dilation (HCC) 09/13/2022   Echo 09/12/2022: EF 50-55, GR 1 DD, mildly reduced RVSF, normal PASP (RVSP 17.7), mild dilation of ascending aorta (38 mm), RAP 3   Breast cancer (HCC) 04/28/2020   rigtht breast   Bronchitis    CAD (coronary artery disease)    1/19 PCI/DES to pLCX for ISR, normal EF.    Cancer (HCC)    hx of precancerous cells in right breast    Cataract    Cervical dysplasia    unsure of procedure, possible "burning" in her late 2s   CHF (congestive heart failure) (HCC)    Echo 06/2019: EF 55-60, elevated LVEDP, normal RV SF, mild MAC, mild MR, trivial TR   Complication of anesthesia    COPD (chronic obstructive pulmonary disease) (HCC)    early    Depression    Dyspnea    with exertion    Family history of adverse reaction to anesthesia    daughter- problems wiht n/v   GERD (gastroesophageal reflux disease)    Gout    Headache(784.0)    Heart murmur    hx of years ago    Hyperlipidemia    Hypertension    Low back pain    Menopausal syndrome    Myocardial infarct (HCC) 2007   hx of   Overactive bladder    PONV (postoperative nausea and vomiting)    Sleep apnea    Vaginal Pap smear, abnormal    Past Surgical History:  Procedure Laterality Date   ANGIOPLASTY     stent 2007   BREAST EXCISIONAL BIOPSY Right 04/2020   ADH/LCIS   BREAST LUMPECTOMY WITH RADIOACTIVE SEED LOCALIZATION Right 04/28/2020   Procedure: RIGHT BREAST LUMPECTOMY X 2  WITH RADIOACTIVE SEED LOCALIZATION;  Surgeon: Manus Rudd, MD;  Location: [facility];  Service: General;  Laterality: Right;  LMA   CARDIAC CATHETERIZATION     COLONOSCOPY     CORONARY STENT INTERVENTION N/A  11/06/2017   Procedure: CORONARY STENT INTERVENTION;  Surgeon: Yvonne Kendall, MD;  Location: [facility];  Service: Cardiovascular;  Laterality: N/A;   LEFT HEART CATH AND CORONARY ANGIOGRAPHY N/A 11/06/2017   Procedure: LEFT HEART CATH AND CORONARY ANGIOGRAPHY;  Surgeon: Yvonne Kendall, MD;  Location: [facility];  Service: Cardiovascular;  Laterality: N/A;   LEFT HEART CATH AND CORONARY ANGIOGRAPHY N/A 07/24/2019   Procedure: LEFT HEART CATH AND CORONARY ANGIOGRAPHY;  Surgeon: Kathleene Hazel, MD;  Location: [facility];  Service: Cardiovascular;  Laterality: N/A;   RIGHT/LEFT HEART CATH AND CORONARY ANGIOGRAPHY N/A 09/29/2020   Procedure: RIGHT/LEFT HEART CATH AND CORONARY ANGIOGRAPHY;  Surgeon: Lyn Records, MD;  Location: [facility];  Service: Cardiovascular;  Laterality: N/A;   ROBOTIC ASSISTED BILATERAL SALPINGO OOPHERECTOMY Bilateral 11/10/2020   Procedure: XI ROBOTIC ASSISTED BILATERAL SALPINGO OOPHORECTOMY WITH MINI LAPAROTOMY FOR DRAINAGE;  Surgeon: Carver Fila, MD;  Location: [facility];  Service: Gynecology;  Laterality: Bilateral;  MINI LAP FIRST   TONSILLECTOMY     VULVECTOMY N/A 11/10/2020   Procedure: WIDE EXCISION VULVECTOMY;  Surgeon: Carver Fila, MD;  Location: [facility];  Service: Gynecology;  Laterality: N/A;  Patient Active Problem List   Diagnosis Date Noted   Hypokalemia 08/07/2023   Intertrigo 04/18/2023   Venous stasis dermatitis of both lower extremities 11/28/2022   Ascending aorta dilation (HCC) 09/13/2022   Dizziness 01/19/2022   Frequent falls 01/19/2022   Tremor of both hands 08/16/2021   Aortic atherosclerosis (HCC) 06/15/2021   Generalized osteoarthritis of multiple sites 12/25/2020   Chronic pain disorder 12/25/2020   Pelvic mass in female    Bilateral lower extremity edema 04/13/2020   Shortness of breath    Anxiety disorder 10/29/2018   CKD (chronic kidney disease), stage III (HCC) 06/27/2018    COPD (chronic obstructive pulmonary disease) (HCC) 05/25/2018   Obesity 03/20/2018   Dyspnea on exertion 11/06/2017   Abnormal stress test 11/06/2017   Chronic heart failure with preserved ejection fraction (HCC) 09/04/2017   Left ventricular dysfunction 07/31/2017   Cataract, nuclear sclerotic senile, bilateral 02/01/2017   Gouty arthritis of toe of left foot 08/09/2016   Hyperuricemia 08/09/2016   Osteoarthritis (arthritis due to wear and tear of joints) 01/14/2014   Atherosclerosis of native coronary artery of native heart with angina pectoris (HCC) 08/02/2011   Mitral regurgitation 08/02/2011   OVERACTIVE BLADDER 07/02/2010   ACUTE CYSTITIS 05/25/2010   Mixed hyperlipidemia 08/11/2009   Leg edema, left 07/22/2009   GERD 04/11/2008   Constipation 04/11/2008   Essential hypertension 05/30/2007   MYOCARDIAL INFARCTION, HX OF 05/30/2007   Allergic rhinitis 05/30/2007   LOW BACK PAIN 05/30/2007    PCP: [place], [name], MD  REFERRING PROVIDER: [place], [name], MD  REFERRING DIAG: R29.6 (ICD-10-CM) - Frequent falls M54.50,G89.29 (ICD-10-CM) - Chronic low back pain without sciatica, unspecified back pain laterality  Rationale for Evaluation and Treatment: Rehabilitation  THERAPY DIAG:  No diagnosis found.  ONSET DATE: Chronic  SUBJECTIVE:                                                                                                                                                                                           SUBJECTIVE STATEMENT: Feels she is more steady on her feet but still has episodes of balance loss.  PERTINENT HISTORY:  See PMH  PAIN:  Are you having pain?  Yes: NPRS scale: 5/10 Pain location: low back Pain description: ache Aggravating factors: prolonged sitting, bending over Relieving factors: pain patches,   PRECAUTIONS: Fall  RED FLAGS: None   WEIGHT BEARING RESTRICTIONS: No  FALLS:  Has patient fallen in last 6 months? Yes. Number of  falls 1  LIVING ENVIRONMENT: Lives with: lives with their spouse Lives in: House/apartment Stairs:  avoids Has following equipment at home: Single point  cane and Walker - 4 wheeled  OCCUPATION: retired  PLOF: Independent  PATIENT GOALS: To reduce my fall risk and manage my back pain  NEXT MD VISIT: PRN  OBJECTIVE:   DIAGNOSTIC FINDINGS:  none  PATIENT SURVEYS:  FOTO 19(39 predicted) ; 08/04/23 40  SCREENING FOR RED FLAGS: none  MUSCLE LENGTH: Hamstrings: WFL in sitting Thomas test: not tested  POSTURE: rounded shoulders, forward head, and increased lumbar lordosis  PALPATION: deferred  LUMBAR ROM: deferred due pain and balance deficits  AROM eval  Flexion   Extension   Right lateral flexion   Left lateral flexion   Right rotation   Left rotation    (Blank rows = not tested)  LOWER EXTREMITY ROM:   WFL for gait and transfers  Active  Right eval Left eval  Hip flexion    Hip extension    Hip abduction    Hip adduction    Hip internal rotation    Hip external rotation    Knee flexion    Knee extension    Ankle dorsiflexion    Ankle plantarflexion    Ankle inversion    Ankle eversion     (Blank rows = not tested)  LOWER EXTREMITY MMT:    MMT Right eval Left eval Right 08/14/23 Left 08/14/23  Hip flexion 3+ 3+ 3+   Hip extension 3+ 3+ 4   Hip abduction 3+ 3+ 4   Hip adduction      Hip internal rotation      Hip external rotation      Knee flexion 3+ 3+ 4 4  Knee extension 3+ 3+ 4 4  Ankle dorsiflexion      Ankle plantarflexion 4 4    Ankle inversion      Ankle eversion       (Blank rows = not tested)  LUMBAR SPECIAL TESTS:  Deferred 07/04/23 Slump test negative B  FUNCTIONAL TESTS:  30 seconds chair stand test 5 reps with BUE support 08/04/23 3 reps due to reports of knee pain/stiffness 08/04/23 2 MWT 137ft mCTSIB position 4 <5s hold 08/04/23 mCTSIB Able to hold all 4 positions for 30s under close S  GAIT: Distance walked:  5ft x2 Assistive device utilized: Environmental consultant - 4 wheeled Level of assistance: Modified independence Comments: slow cadence and flexed posture  TODAY'S TREATMENT:    [facility] Adult PT Treatment:                                                DATE: 09/21/23 Therapeutic Exercise: Nustep L3 8 min  Neuromuscular re-ed: Fwd/bwd walking in // bars, single UE support only 1 trip each Tandem standing on 4 in block w/o UE support, alt 5/5 Unsupported standing with random alternating BUE reaching, 60s on each side Transfer training emphasizing body mechanics and use of armrests when asending/descending   [facility] Adult PT Treatment:                                                DATE: 09/19/23 Therapeutic Exercise: Nustep L6 x 5 min while taking subjective Heel/toe raises  Standing hip abduction Standing marching LAQ 2x10 4# Seated marches 2x20 4# LTR x 10  Supine clamshell 3x15 blue band  Supine SLR x 5 each - L difficult Supine ball squeeze 2x10 - 5" hold Seated hamstring curl 2x10 black band Modalities: MHP to lumbar paraspinals during supine exercises   [facility] Adult PT Treatment:                                                DATE: 09/13/23 Therapeutic Exercise: Nustep L6 x 5 min while taking subjective LAQ 2x10 4# Seated marches 2x20 4# LTR x 10  Supine clamshell 3x15 blue band Supine SLR x 5 each - L difficult Supine ball squeeze 2x10 - 5" hold Seated hamstring curl 2x10 black band Modalities: MHP to lumbar paraspinals during supine exercises  [facility] Adult PT Treatment:                                                DATE: 09/08/23 Therapeutic Activity: 2 MWT 247ft  30s chair stand test 5 reps Nustep L4 6 min to facilitate shoulder/hip dissociation and promote more fluid gait pattern.   09/08/23 0001  Berg Balance Test  Sit to Stand 3  Standing Unsupported 3  Sitting with Back Unsupported but Feet Supported on Floor or Stool 4  Stand to Sit 4  Transfers 3  Standing Unsupported  with Eyes Closed 4  Standing Unsupported with Feet Together 3  From Standing, Reach Forward with Outstretched Arm 3  From Standing Position, Pick up Object from Floor 4  From Standing Position, Turn to Look Behind Over each Shoulder 4  Turn 360 Degrees 2  Standing Unsupported, Alternately Place Feet on Step/Stool 3  Standing Unsupported, One Foot in Front 3  Standing on One Leg 1  Total Score 44      PATIENT EDUCATION:  Education details: Discussed eval findings, rehab rationale and POC and patient is in agreement  Person educated: Patient Education method: Explanation Education comprehension: verbalized understanding and needs further education  HOME EXERCISE PROGRAM: Access Code: [email or web address] URL: [email or web address] Date: 06/27/2023 Prepared by: Gustavus Bryant  Exercises - Sit to Stand with Armchair  - 2 x daily - 5 x weekly - 1 sets - 5 reps - Heel Toe Raises with Counter Support  - 2 x daily - 5 x weekly - 2 sets - 10 reps - Standing [employer] at [employer]  - 2 x daily - 5 x weekly - 2 sets - 10 reps  ASSESSMENT:  CLINICAL IMPRESSION: Discussed need to mobilize more at home as she spends her day sitting. Session today focused on balance and proprioceptive training trying to wean patient from unnecessary UE support to challenge balance and righting reactions.     EVAL: Patient is a 77 y.o. female who was seen today for physical therapy evaluation and treatment for low back pain and frequent falls. She presents with LE strength deficits as well as diminished balance in vision removed and compliant surface environments.  5x STS time demonstrates functional strength deficits and further balance testing needed to identify specific regions of balance dysfunction.  Suspected underlying BPPV may need to be addressed.    OBJECTIVE IMPAIRMENTS: Abnormal gait, decreased activity tolerance, decreased balance, decreased knowledge of condition, decreased mobility,  difficulty walking, decreased strength, decreased safety awareness, dizziness, postural dysfunction, and obesity.  ACTIVITY LIMITATIONS: carrying, lifting, standing, stairs, and locomotion level  PERSONAL FACTORS: Age, Past/current experiences, and Time since onset of injury/illness/exacerbation are also affecting patient's functional outcome.   REHAB POTENTIAL: Good  CLINICAL DECISION MAKING: Evolving/moderate complexity  EVALUATION COMPLEXITY: Moderate   GOALS: Goals reviewed with patient? No  SHORT TERM GOALS: Target date: 07/25/2023  Patient to demonstrate independence in HEP  Baseline: [email or web address] Goal status: MET  2.  Assess 2 MWT and establish baseline Baseline: TBD; 07/04/23 155ft with rollator Goal status: MET  3.  Increase reps to 7 on 30s chair stand test Baseline: 5 reps 08/04/23: 3 due to B knee pain 08/14/23: 6 reps Goal status: IN PROGRESS  LONG TERM GOALS: Target date: 08/22/2023  Increase B LE strength to 4-/5 Baseline: See MMT chart Goal status: IN PROGRESS  2.  Increase FOTO score to 39 Baseline: 19;  08/04/23: 40 Goal status: MET  3.  Increase time on position 4 of mCTSIB to 20s Baseline: <5s; 08/04/23 mCTSIB Able to hold all 4 positions for 30s under close S Goal status: MET  4.  Re-asess 2 MWT and note gains Baseline: TBD; 07/04/23 138ft with rollator; 08/04/23 2 MWT 164ft; 09/08/23 259ft Goal status: MET   PLAN:  PT FREQUENCY: 1-2x/week  PT DURATION: 6 weeks  PLANNED INTERVENTIONS: Therapeutic exercises, Therapeutic activity, Neuromuscular re-education, Balance training, Gait training, Patient/Family education, Self Care, Joint mobilization, Stair training, Vestibular training, Canalith repositioning, Visual/preceptual remediation/compensation, DME instructions, Dry Needling, Electrical stimulation, Spinal mobilization, Cryotherapy, Moist heat, Manual therapy, and Re-evaluation.  PLAN FOR NEXT SESSION: HEP review and update, manual  techniques as appropriate, aerobic tasks, ROM and flexibility activities, strengthening and PREs, TPDN, gait and balance training as needed    Hildred Laser, PT 09/22/2023, 8:56 AM

## 2023-09-26 ENCOUNTER — Ambulatory Visit: Payer: Medicare Other

## 2023-09-26 DIAGNOSIS — I2510 Atherosclerotic heart disease of native coronary artery without angina pectoris: Secondary | ICD-10-CM | POA: Diagnosis present

## 2023-09-26 DIAGNOSIS — J9601 Acute respiratory failure with hypoxia: Secondary | ICD-10-CM | POA: Diagnosis not present

## 2023-09-26 DIAGNOSIS — Z923 Personal history of irradiation: Secondary | ICD-10-CM | POA: Diagnosis not present

## 2023-09-26 DIAGNOSIS — Z1152 Encounter for screening for COVID-19: Secondary | ICD-10-CM | POA: Diagnosis not present

## 2023-09-26 DIAGNOSIS — Z882 Allergy status to sulfonamides status: Secondary | ICD-10-CM | POA: Diagnosis not present

## 2023-09-26 DIAGNOSIS — Z79899 Other long term (current) drug therapy: Secondary | ICD-10-CM | POA: Diagnosis not present

## 2023-09-26 DIAGNOSIS — M6281 Muscle weakness (generalized): Secondary | ICD-10-CM

## 2023-09-26 DIAGNOSIS — I700 Atherosclerosis of aorta: Secondary | ICD-10-CM | POA: Diagnosis not present

## 2023-09-26 DIAGNOSIS — R2681 Unsteadiness on feet: Secondary | ICD-10-CM

## 2023-09-26 DIAGNOSIS — I872 Venous insufficiency (chronic) (peripheral): Secondary | ICD-10-CM | POA: Diagnosis present

## 2023-09-26 DIAGNOSIS — I130 Hypertensive heart and chronic kidney disease with heart failure and stage 1 through stage 4 chronic kidney disease, or unspecified chronic kidney disease: Secondary | ICD-10-CM | POA: Diagnosis present

## 2023-09-26 DIAGNOSIS — Z7902 Long term (current) use of antithrombotics/antiplatelets: Secondary | ICD-10-CM | POA: Diagnosis not present

## 2023-09-26 DIAGNOSIS — Z881 Allergy status to other antibiotic agents status: Secondary | ICD-10-CM | POA: Diagnosis not present

## 2023-09-26 DIAGNOSIS — J440 Chronic obstructive pulmonary disease with acute lower respiratory infection: Secondary | ICD-10-CM | POA: Diagnosis present

## 2023-09-26 DIAGNOSIS — Z853 Personal history of malignant neoplasm of breast: Secondary | ICD-10-CM | POA: Diagnosis not present

## 2023-09-26 DIAGNOSIS — J90 Pleural effusion, not elsewhere classified: Secondary | ICD-10-CM | POA: Diagnosis not present

## 2023-09-26 DIAGNOSIS — E669 Obesity, unspecified: Secondary | ICD-10-CM | POA: Diagnosis present

## 2023-09-26 DIAGNOSIS — Z87891 Personal history of nicotine dependence: Secondary | ICD-10-CM | POA: Diagnosis not present

## 2023-09-26 DIAGNOSIS — R918 Other nonspecific abnormal finding of lung field: Secondary | ICD-10-CM | POA: Diagnosis not present

## 2023-09-26 DIAGNOSIS — K219 Gastro-esophageal reflux disease without esophagitis: Secondary | ICD-10-CM | POA: Diagnosis present

## 2023-09-26 DIAGNOSIS — E876 Hypokalemia: Secondary | ICD-10-CM | POA: Diagnosis present

## 2023-09-26 DIAGNOSIS — E782 Mixed hyperlipidemia: Secondary | ICD-10-CM | POA: Diagnosis present

## 2023-09-26 DIAGNOSIS — J180 Bronchopneumonia, unspecified organism: Secondary | ICD-10-CM | POA: Diagnosis not present

## 2023-09-26 DIAGNOSIS — I5032 Chronic diastolic (congestive) heart failure: Secondary | ICD-10-CM | POA: Diagnosis present

## 2023-09-26 DIAGNOSIS — R0902 Hypoxemia: Secondary | ICD-10-CM | POA: Diagnosis not present

## 2023-09-26 DIAGNOSIS — R531 Weakness: Secondary | ICD-10-CM | POA: Diagnosis not present

## 2023-09-26 DIAGNOSIS — Z955 Presence of coronary angioplasty implant and graft: Secondary | ICD-10-CM | POA: Diagnosis not present

## 2023-09-26 DIAGNOSIS — N1831 Chronic kidney disease, stage 3a: Secondary | ICD-10-CM | POA: Diagnosis present

## 2023-09-26 DIAGNOSIS — G4733 Obstructive sleep apnea (adult) (pediatric): Secondary | ICD-10-CM | POA: Diagnosis present

## 2023-09-26 DIAGNOSIS — I10 Essential (primary) hypertension: Secondary | ICD-10-CM | POA: Diagnosis not present

## 2023-09-26 DIAGNOSIS — M109 Gout, unspecified: Secondary | ICD-10-CM | POA: Diagnosis present

## 2023-09-26 DIAGNOSIS — J189 Pneumonia, unspecified organism: Secondary | ICD-10-CM | POA: Diagnosis not present

## 2023-09-26 DIAGNOSIS — L03116 Cellulitis of left lower limb: Secondary | ICD-10-CM | POA: Diagnosis not present

## 2023-09-26 DIAGNOSIS — M5459 Other low back pain: Secondary | ICD-10-CM

## 2023-09-26 DIAGNOSIS — Z79818 Long term (current) use of other agents affecting estrogen receptors and estrogen levels: Secondary | ICD-10-CM | POA: Diagnosis not present

## 2023-09-27 NOTE — Therapy (Unsigned)
OUTPATIENT PHYSICAL THERAPY TREATMENT NOTE   Patient Name: Tracy [name] MRN: [id number] DOB:October 12, 1946, 77 y.o., female Today's Date: 09/28/2023  END OF SESSION:  PT End of Session - 09/28/23 1305     Visit Number 19    Number of Visits 20    Date for PT Re-Evaluation 10/24/23    Authorization Type MCR    PT Start Time 1315    PT Stop Time 1355    PT Time Calculation (min) 40 min    Activity Tolerance Patient tolerated treatment well    Behavior During Therapy [facility] for tasks assessed/performed              Past Medical History:  Diagnosis Date   Allergic rhinitis    Allergy    Anxiety    Arthritis    Ascending aorta dilation (HCC) 09/13/2022   Echo 09/12/2022: EF 50-55, GR 1 DD, mildly reduced RVSF, normal PASP (RVSP 17.7), mild dilation of ascending aorta (38 mm), RAP 3   Breast cancer (HCC) 04/28/2020   rigtht breast   Bronchitis    CAD (coronary artery disease)    1/19 PCI/DES to pLCX for ISR, normal EF.    Cancer (HCC)    hx of precancerous cells in right breast    Cataract    Cervical dysplasia    unsure of procedure, possible "burning" in her late 77s   CHF (congestive heart failure) (HCC)    Echo 06/2019: EF 55-60, elevated LVEDP, normal RV SF, mild MAC, mild MR, trivial TR   Complication of anesthesia    COPD (chronic obstructive pulmonary disease) (HCC)    early    Depression    Dyspnea    with exertion    Family history of adverse reaction to anesthesia    daughter- problems wiht n/v   GERD (gastroesophageal reflux disease)    Gout    Headache(784.0)    Heart murmur    hx of years ago    Hyperlipidemia    Hypertension    Low back pain    Menopausal syndrome    Myocardial infarct (HCC) 2007   hx of   Overactive bladder    PONV (postoperative nausea and vomiting)    Sleep apnea    Vaginal Pap smear, abnormal    Past Surgical History:  Procedure Laterality Date   ANGIOPLASTY     stent 2007   BREAST EXCISIONAL BIOPSY Right  04/2020   ADH/LCIS   BREAST LUMPECTOMY WITH RADIOACTIVE SEED LOCALIZATION Right 04/28/2020   Procedure: RIGHT BREAST LUMPECTOMY X 2  WITH RADIOACTIVE SEED LOCALIZATION;  Surgeon: Manus Rudd, MD;  Location: [facility];  Service: General;  Laterality: Right;  LMA   CARDIAC CATHETERIZATION     COLONOSCOPY     CORONARY STENT INTERVENTION N/A 11/06/2017   Procedure: CORONARY STENT INTERVENTION;  Surgeon: Yvonne Kendall, MD;  Location: [facility];  Service: Cardiovascular;  Laterality: N/A;   LEFT HEART CATH AND CORONARY ANGIOGRAPHY N/A 11/06/2017   Procedure: LEFT HEART CATH AND CORONARY ANGIOGRAPHY;  Surgeon: Yvonne Kendall, MD;  Location: [facility];  Service: Cardiovascular;  Laterality: N/A;   LEFT HEART CATH AND CORONARY ANGIOGRAPHY N/A 07/24/2019   Procedure: LEFT HEART CATH AND CORONARY ANGIOGRAPHY;  Surgeon: Kathleene Hazel, MD;  Location: [facility];  Service: Cardiovascular;  Laterality: N/A;   RIGHT/LEFT HEART CATH AND CORONARY ANGIOGRAPHY N/A 09/29/2020   Procedure: RIGHT/LEFT HEART CATH AND CORONARY ANGIOGRAPHY;  Surgeon: Verdis Prime  W, MD;  Location: [facility];  Service: Cardiovascular;  Laterality: N/A;   ROBOTIC ASSISTED BILATERAL SALPINGO OOPHERECTOMY Bilateral 11/10/2020   Procedure: XI ROBOTIC ASSISTED BILATERAL SALPINGO OOPHORECTOMY WITH MINI LAPAROTOMY FOR DRAINAGE;  Surgeon: Carver Fila, MD;  Location: [facility];  Service: Gynecology;  Laterality: Bilateral;  MINI LAP FIRST   TONSILLECTOMY     VULVECTOMY N/A 11/10/2020   Procedure: WIDE EXCISION VULVECTOMY;  Surgeon: Carver Fila, MD;  Location: [facility];  Service: Gynecology;  Laterality: N/A;   Patient Active Problem List   Diagnosis Date Noted   Hypokalemia 08/07/2023   Intertrigo 04/18/2023   Venous stasis dermatitis of both lower extremities 11/28/2022   Ascending aorta dilation (HCC) 09/13/2022   Dizziness 01/19/2022   Frequent falls  01/19/2022   Tremor of both hands 08/16/2021   Aortic atherosclerosis (HCC) 06/15/2021   Generalized osteoarthritis of multiple sites 12/25/2020   Chronic pain disorder 12/25/2020   Pelvic mass in female    Bilateral lower extremity edema 04/13/2020   Shortness of breath    Anxiety disorder 10/29/2018   CKD (chronic kidney disease), stage III (HCC) 06/27/2018   COPD (chronic obstructive pulmonary disease) (HCC) 05/25/2018   Obesity 03/20/2018   Dyspnea on exertion 11/06/2017   Abnormal stress test 11/06/2017   Chronic heart failure with preserved ejection fraction (HCC) 09/04/2017   Left ventricular dysfunction 07/31/2017   Cataract, nuclear sclerotic senile, bilateral 02/01/2017   Gouty arthritis of toe of left foot 08/09/2016   Hyperuricemia 08/09/2016   Osteoarthritis (arthritis due to wear and tear of joints) 01/14/2014   Atherosclerosis of native coronary artery of native heart with angina pectoris (HCC) 08/02/2011   Mitral regurgitation 08/02/2011   OVERACTIVE BLADDER 07/02/2010   ACUTE CYSTITIS 05/25/2010   Mixed hyperlipidemia 08/11/2009   Leg edema, left 07/22/2009   GERD 04/11/2008   Constipation 04/11/2008   Essential hypertension 05/30/2007   MYOCARDIAL INFARCTION, HX OF 05/30/2007   Allergic rhinitis 05/30/2007   LOW BACK PAIN 05/30/2007    PCP: [place], [name], MD  REFERRING PROVIDER: [place], [name], MD  REFERRING DIAG: R29.6 (ICD-10-CM) - Frequent falls M54.50,G89.29 (ICD-10-CM) - Chronic low back pain without sciatica, unspecified back pain laterality  Rationale for Evaluation and Treatment: Rehabilitation  THERAPY DIAG:  Other low back pain  Muscle weakness (generalized)  Unsteadiness on feet  ONSET DATE: Chronic  SUBJECTIVE:                                                                                                                                                                                           SUBJECTIVE STATEMENT: Has not  been  mobilizing much at home.  Spends the majority of time sitting.  Has not had a response from MD office regarding L foreleg swelling and drainage.  Recommended UC visit to assess but patient declined.  PERTINENT HISTORY:  See PMH  PAIN:  Are you having pain?  Yes: NPRS scale: 5/10 Pain location: low back Pain description: ache Aggravating factors: prolonged sitting, bending over Relieving factors: pain patches,   PRECAUTIONS: Fall  RED FLAGS: None   WEIGHT BEARING RESTRICTIONS: No  FALLS:  Has patient fallen in last 6 months? Yes. Number of falls 1  LIVING ENVIRONMENT: Lives with: lives with their spouse Lives in: House/apartment Stairs:  avoids Has following equipment at home: Single point cane and Environmental consultant - 4 wheeled  OCCUPATION: retired  PLOF: Independent  NEXT MD VISIT: PRN  OBJECTIVE:   DIAGNOSTIC FINDINGS:  none  PATIENT SURVEYS:  FOTO 19(39 predicted) ; 08/04/23 40  SCREENING FOR RED FLAGS: none  MUSCLE LENGTH: Hamstrings: WFL in sitting Thomas test: not tested  POSTURE: rounded shoulders, forward head, and increased lumbar lordosis  PALPATION: deferred  LUMBAR ROM: deferred due pain and balance deficits  AROM eval  Flexion   Extension   Right lateral flexion   Left lateral flexion   Right rotation   Left rotation    (Blank rows = not tested)  LOWER EXTREMITY ROM:   WFL for gait and transfers  Active  Right eval Left eval  Hip flexion    Hip extension    Hip abduction    Hip adduction    Hip internal rotation    Hip external rotation    Knee flexion    Knee extension    Ankle dorsiflexion    Ankle plantarflexion    Ankle inversion    Ankle eversion     (Blank rows = not tested)  LOWER EXTREMITY MMT:    MMT Right eval Left eval Right 08/14/23 Left 08/14/23  Hip flexion 3+ 3+ 3+   Hip extension 3+ 3+ 4   Hip abduction 3+ 3+ 4   Hip adduction      Hip internal rotation      Hip external rotation      Knee flexion 3+ 3+  4 4  Knee extension 3+ 3+ 4 4  Ankle dorsiflexion      Ankle plantarflexion 4 4    Ankle inversion      Ankle eversion       (Blank rows = not tested)  LUMBAR SPECIAL TESTS:  Deferred 07/04/23 Slump test negative B  FUNCTIONAL TESTS:  30 seconds chair stand test 5 reps with BUE support 08/04/23 3 reps due to reports of knee pain/stiffness 08/04/23 2 MWT 184ft mCTSIB position 4 <5s hold 08/04/23 mCTSIB Able to hold all 4 positions for 30s under close S  GAIT: Distance walked: 37ft x2 Assistive device utilized: Environmental consultant - 4 wheeled 2s hold Level of assistance: Modified independence Comments: slow cadence and flexed posture  TODAY'S TREATMENT:    [facility] Adult PT Treatment:                                                DATE: 09/28/23 Therapeutic Exercise: Nustep L2 8 min Standing heel raise 2x10 single UE support Standing toe raise 2x10 single UE support FAQs 2x10 B 2s hold Tandem stance on 4 in block  10/10 single UE used to step but none to stabilize Lateral stepping onot 4 in block 10/10 single UE used to step but none to stabilize Tandem standing unsupported 30s B  [facility] Adult PT Treatment:                                                DATE: 09/26/23 Therapeutic Exercise: Nustep L2 8 min Latissimus press with alt FAQs 15x Seated hip toss 2000g ball 15x  [facility] Adult PT Treatment:                                                DATE: 09/21/23 Therapeutic Exercise: Nustep L3 8 min  Neuromuscular re-ed: Fwd/bwd walking in // bars, single UE support only 1 trip each Tandem standing on 4 in block w/o UE support, alt 5/5 Unsupported standing with random alternating BUE reaching, 60s on each side Transfer training emphasizing body mechanics and use of armrests when asending/descending   [facility] Adult PT Treatment:                                                DATE: 09/19/23 Therapeutic Exercise: Nustep L6 x 5 min while taking subjective Heel/toe raises  Standing hip  abduction Standing marching LAQ 2x10 4# Seated marches 2x20 4# LTR x 10  Supine clamshell 3x15 blue band Supine SLR x 5 each - L difficult Supine ball squeeze 2x10 - 5" hold Seated hamstring curl 2x10 black band Modalities: MHP to lumbar paraspinals during supine exercises   [facility] Adult PT Treatment:                                                DATE: 09/13/23 Therapeutic Exercise: Nustep L6 x 5 min while taking subjective LAQ 2x10 4# Seated marches 2x20 4# LTR x 10  Supine clamshell 3x15 blue band Supine SLR x 5 each - L difficult Supine ball squeeze 2x10 - 5" hold Seated hamstring curl 2x10 black band Modalities: MHP to lumbar paraspinals during supine exercises  [facility] Adult PT Treatment:                                                DATE: 09/08/23 Therapeutic Activity: 2 MWT 210ft  30s chair stand test 5 reps Nustep L4 6 min to facilitate shoulder/hip dissociation and promote more fluid gait pattern.   09/08/23 0001  Berg Balance Test  Sit to Stand 3  Standing Unsupported 3  Sitting with Back Unsupported but Feet Supported on Floor or Stool 4  Stand to Sit 4  Transfers 3  Standing Unsupported with Eyes Closed 4  Standing Unsupported with Feet Together 3  From Standing, Reach Forward with Outstretched Arm 3  From Standing Position, Pick up Object from Floor 4  From Standing Position, Turn to Look Behind Over  each Shoulder 4  Turn 360 Degrees 2  Standing Unsupported, Alternately Place Feet on Step/Stool 3  Standing Unsupported, One Foot in Front 3  Standing on One Leg 1  Total Score 44      PATIENT EDUCATION:  Education details: Discussed eval findings, rehab rationale and POC and patient is in agreement  Person educated: Patient Education method: Explanation Education comprehension: verbalized understanding and needs further education  HOME EXERCISE PROGRAM: Access Code: [email or web address] URL: [email or web address] Date: 06/27/2023 Prepared by:  Gustavus Bryant  Exercises - Sit to Stand with Armchair  - 2 x daily - 5 x weekly - 1 sets - 5 reps - Heel Toe Raises with Counter Support  - 2 x daily - 5 x weekly - 2 sets - 10 reps - Standing [employer] at [employer]  - 2 x daily - 5 x weekly - 2 sets - 10 reps  ASSESSMENT:  CLINICAL IMPRESSION: Focus of today was balance tasks in static position focus on stepping tasks and unsupported standing.  Apprehension observed, one mild balance disturbance observed wit patient able to self correct.  EVAL: Patient is a 77 y.o. female who was seen today for physical therapy evaluation and treatment for low back pain and frequent falls. She presents with LE strength deficits as well as diminished balance in vision removed and compliant surface environments.  5x STS time demonstrates functional strength deficits and further balance testing needed to identify specific regions of balance dysfunction.  Suspected underlying BPPV may need to be addressed.    OBJECTIVE IMPAIRMENTS: Abnormal gait, decreased activity tolerance, decreased balance, decreased knowledge of condition, decreased mobility, difficulty walking, decreased strength, decreased safety awareness, dizziness, postural dysfunction, and obesity.   ACTIVITY LIMITATIONS: carrying, lifting, standing, stairs, and locomotion level  PERSONAL FACTORS: Age, Past/current experiences, and Time since onset of injury/illness/exacerbation are also affecting patient's functional outcome.   REHAB POTENTIAL: Good  CLINICAL DECISION MAKING: Evolving/moderate complexity  EVALUATION COMPLEXITY: Moderate   GOALS: Goals reviewed with patient? No  SHORT TERM GOALS: Target date: 07/25/2023  Patient to demonstrate independence in HEP  Baseline: [email or web address] Goal status: MET  2.  Assess 2 MWT and establish baseline Baseline: TBD; 07/04/23 173ft with rollator Goal status: MET  3.  Increase reps to 7 on 30s chair stand test Baseline: 5 reps 08/04/23: 3 due to  B knee pain 08/14/23: 6 reps Goal status: IN PROGRESS  LONG TERM GOALS: Target date: 08/22/2023  Increase B LE strength to 4-/5 Baseline: See MMT chart Goal status: IN PROGRESS  2.  Increase FOTO score to 39 Baseline: 19;  08/04/23: 40 Goal status: MET  3.  Increase time on position 4 of mCTSIB to 20s Baseline: <5s; 08/04/23 mCTSIB Able to hold all 4 positions for 30s under close S Goal status: MET  4.  Re-asess 2 MWT and note gains Baseline: TBD; 07/04/23 137ft with rollator; 08/04/23 2 MWT 120ft; 09/08/23 253ft Goal status: MET   PLAN:  PT FREQUENCY: 1-2x/week  PT DURATION: 6 weeks  PLANNED INTERVENTIONS: Therapeutic exercises, Therapeutic activity, Neuromuscular re-education, Balance training, Gait training, Patient/Family education, Self Care, Joint mobilization, Stair training, Vestibular training, Canalith repositioning, Visual/preceptual remediation/compensation, DME instructions, Dry Needling, Electrical stimulation, Spinal mobilization, Cryotherapy, Moist heat, Manual therapy, and Re-evaluation.  PLAN FOR NEXT SESSION: HEP review and update, manual techniques as appropriate, aerobic tasks, ROM and flexibility activities, strengthening and PREs, TPDN, gait and balance training as needed    Hildred Laser, PT 09/28/2023, 1:57 PM

## 2023-09-28 ENCOUNTER — Ambulatory Visit: Payer: Medicare Other

## 2023-09-28 DIAGNOSIS — M6281 Muscle weakness (generalized): Secondary | ICD-10-CM

## 2023-09-28 DIAGNOSIS — R2681 Unsteadiness on feet: Secondary | ICD-10-CM

## 2023-09-28 DIAGNOSIS — M5459 Other low back pain: Secondary | ICD-10-CM

## 2023-09-28 NOTE — Addendum Note (Signed)
Addended by: Hildred Laser on: 09/28/2023 12:15 PM   Modules accepted: Orders

## 2023-09-29 ENCOUNTER — Emergency Department (HOSPITAL_COMMUNITY): Payer: Medicare Other

## 2023-09-29 ENCOUNTER — Inpatient Hospital Stay (HOSPITAL_COMMUNITY)
Admission: EM | Admit: 2023-09-29 | Discharge: 2023-10-05 | DRG: 193 | Disposition: A | Discharge status: Home / Self Care | Payer: Medicare Other | Attending: Student in an Organized Health Care Education/Training Program | Admitting: Student in an Organized Health Care Education/Training Program

## 2023-09-29 ENCOUNTER — Encounter (HOSPITAL_COMMUNITY): Payer: Self-pay | Admitting: Internal Medicine

## 2023-09-29 ENCOUNTER — Other Ambulatory Visit: Payer: Self-pay

## 2023-09-29 DIAGNOSIS — Z87891 Personal history of nicotine dependence: Secondary | ICD-10-CM

## 2023-09-29 DIAGNOSIS — E782 Mixed hyperlipidemia: Secondary | ICD-10-CM | POA: Diagnosis present

## 2023-09-29 DIAGNOSIS — Z7982 Long term (current) use of aspirin: Secondary | ICD-10-CM

## 2023-09-29 DIAGNOSIS — N183 Chronic kidney disease, stage 3 unspecified: Secondary | ICD-10-CM | POA: Diagnosis present

## 2023-09-29 DIAGNOSIS — Z79899 Other long term (current) drug therapy: Secondary | ICD-10-CM | POA: Diagnosis not present

## 2023-09-29 DIAGNOSIS — Z1152 Encounter for screening for COVID-19: Secondary | ICD-10-CM

## 2023-09-29 DIAGNOSIS — N1831 Chronic kidney disease, stage 3a: Secondary | ICD-10-CM | POA: Diagnosis present

## 2023-09-29 DIAGNOSIS — Z923 Personal history of irradiation: Secondary | ICD-10-CM | POA: Diagnosis not present

## 2023-09-29 DIAGNOSIS — M109 Gout, unspecified: Secondary | ICD-10-CM | POA: Diagnosis present

## 2023-09-29 DIAGNOSIS — J9601 Acute respiratory failure with hypoxia: Secondary | ICD-10-CM | POA: Diagnosis present

## 2023-09-29 DIAGNOSIS — Z818 Family history of other mental and behavioral disorders: Secondary | ICD-10-CM

## 2023-09-29 DIAGNOSIS — R0902 Hypoxemia: Secondary | ICD-10-CM

## 2023-09-29 DIAGNOSIS — I2510 Atherosclerotic heart disease of native coronary artery without angina pectoris: Secondary | ICD-10-CM | POA: Diagnosis present

## 2023-09-29 DIAGNOSIS — Z881 Allergy status to other antibiotic agents status: Secondary | ICD-10-CM | POA: Diagnosis not present

## 2023-09-29 DIAGNOSIS — E669 Obesity, unspecified: Secondary | ICD-10-CM | POA: Diagnosis present

## 2023-09-29 DIAGNOSIS — Z7902 Long term (current) use of antithrombotics/antiplatelets: Secondary | ICD-10-CM | POA: Diagnosis not present

## 2023-09-29 DIAGNOSIS — Z800 Family history of malignant neoplasm of digestive organs: Secondary | ICD-10-CM

## 2023-09-29 DIAGNOSIS — Z853 Personal history of malignant neoplasm of breast: Secondary | ICD-10-CM | POA: Diagnosis not present

## 2023-09-29 DIAGNOSIS — I5032 Chronic diastolic (congestive) heart failure: Secondary | ICD-10-CM | POA: Diagnosis present

## 2023-09-29 DIAGNOSIS — G4733 Obstructive sleep apnea (adult) (pediatric): Secondary | ICD-10-CM | POA: Diagnosis present

## 2023-09-29 DIAGNOSIS — Z882 Allergy status to sulfonamides status: Secondary | ICD-10-CM

## 2023-09-29 DIAGNOSIS — I872 Venous insufficiency (chronic) (peripheral): Secondary | ICD-10-CM | POA: Diagnosis present

## 2023-09-29 DIAGNOSIS — K219 Gastro-esophageal reflux disease without esophagitis: Secondary | ICD-10-CM | POA: Diagnosis present

## 2023-09-29 DIAGNOSIS — R531 Weakness: Secondary | ICD-10-CM | POA: Diagnosis not present

## 2023-09-29 DIAGNOSIS — I10 Essential (primary) hypertension: Secondary | ICD-10-CM

## 2023-09-29 DIAGNOSIS — E876 Hypokalemia: Secondary | ICD-10-CM | POA: Diagnosis present

## 2023-09-29 DIAGNOSIS — Z820 Family history of epilepsy and other diseases of the nervous system: Secondary | ICD-10-CM

## 2023-09-29 DIAGNOSIS — Z955 Presence of coronary angioplasty implant and graft: Secondary | ICD-10-CM | POA: Diagnosis not present

## 2023-09-29 DIAGNOSIS — S81802A Unspecified open wound, left lower leg, initial encounter: Secondary | ICD-10-CM

## 2023-09-29 DIAGNOSIS — J449 Chronic obstructive pulmonary disease, unspecified: Secondary | ICD-10-CM | POA: Diagnosis present

## 2023-09-29 DIAGNOSIS — J90 Pleural effusion, not elsewhere classified: Secondary | ICD-10-CM | POA: Diagnosis not present

## 2023-09-29 DIAGNOSIS — L03116 Cellulitis of left lower limb: Secondary | ICD-10-CM | POA: Diagnosis not present

## 2023-09-29 DIAGNOSIS — Z6837 Body mass index (BMI) 37.0-37.9, adult: Secondary | ICD-10-CM

## 2023-09-29 DIAGNOSIS — R918 Other nonspecific abnormal finding of lung field: Secondary | ICD-10-CM | POA: Diagnosis not present

## 2023-09-29 DIAGNOSIS — I130 Hypertensive heart and chronic kidney disease with heart failure and stage 1 through stage 4 chronic kidney disease, or unspecified chronic kidney disease: Secondary | ICD-10-CM | POA: Diagnosis present

## 2023-09-29 DIAGNOSIS — J180 Bronchopneumonia, unspecified organism: Principal | ICD-10-CM

## 2023-09-29 DIAGNOSIS — J189 Pneumonia, unspecified organism: Secondary | ICD-10-CM | POA: Diagnosis not present

## 2023-09-29 DIAGNOSIS — Z79818 Long term (current) use of other agents affecting estrogen receptors and estrogen levels: Secondary | ICD-10-CM

## 2023-09-29 DIAGNOSIS — J440 Chronic obstructive pulmonary disease with acute lower respiratory infection: Secondary | ICD-10-CM | POA: Diagnosis present

## 2023-09-29 DIAGNOSIS — Z833 Family history of diabetes mellitus: Secondary | ICD-10-CM

## 2023-09-29 DIAGNOSIS — I890 Lymphedema, not elsewhere classified: Secondary | ICD-10-CM | POA: Diagnosis present

## 2023-09-29 DIAGNOSIS — I700 Atherosclerosis of aorta: Secondary | ICD-10-CM | POA: Diagnosis not present

## 2023-09-29 LAB — BASIC METABOLIC PANEL
Anion gap: 11 (ref 5–15)
BUN: 16 mg/dL (ref 8–23)
CO2: 22 mmol/L (ref 22–32)
Calcium: 9.1 mg/dL (ref 8.9–10.3)
Chloride: 108 mmol/L (ref 98–111)
Creatinine, Ser: 1.1 mg/dL — ABNORMAL HIGH (ref 0.44–1.00)
GFR, Estimated: 52 mL/min — ABNORMAL LOW (ref >60)
Glucose, Bld: 178 mg/dL — ABNORMAL HIGH (ref 70–99)
Potassium: 3.9 mmol/L (ref 3.5–5.1)
Sodium: 141 mmol/L (ref 135–145)

## 2023-09-29 LAB — PROTIME-INR
INR: 1 (ref 0.8–1.2)
Prothrombin Time: 13.6 s (ref 11.4–15.2)

## 2023-09-29 LAB — CBC
HCT: 43.1 % (ref 36.0–46.0)
Hemoglobin: 13.1 g/dL (ref 12.0–15.0)
MCH: 26.5 pg (ref 26.0–34.0)
MCHC: 30.4 g/dL (ref 30.0–36.0)
MCV: 87.1 fL (ref 80.0–100.0)
Platelets: 273 10*3/uL (ref 150–400)
RBC: 4.95 MIL/uL (ref 3.87–5.11)
RDW: 15.8 % — ABNORMAL HIGH (ref 11.5–15.5)
WBC: 11.9 10*3/uL — ABNORMAL HIGH (ref 4.0–10.5)
nRBC: 0 % (ref 0.0–0.2)

## 2023-09-29 LAB — STREP PNEUMONIAE URINARY ANTIGEN: Strep Pneumo Urinary Antigen: NEGATIVE

## 2023-09-29 LAB — PROCALCITONIN: Procalcitonin: <0.1 ng/mL

## 2023-09-29 LAB — TROPONIN I (HIGH SENSITIVITY): Troponin I (High Sensitivity): 14 ng/L (ref <18)

## 2023-09-29 LAB — BRAIN NATRIURETIC PEPTIDE: B Natriuretic Peptide: 507 pg/mL — ABNORMAL HIGH (ref 0.0–100.0)

## 2023-09-29 LAB — SARS CORONAVIRUS 2 BY RT PCR: SARS Coronavirus 2 by RT PCR: NEGATIVE

## 2023-09-29 LAB — I-STAT CG4 LACTIC ACID, ED: Lactic Acid, Venous: 1.2 mmol/L (ref 0.5–1.9)

## 2023-09-29 MED ORDER — LACTATED RINGERS IV BOLUS (SEPSIS)
500.0000 mL | Freq: Once | INTRAVENOUS | Status: AC
  Administered 2023-09-29: 500 mL via INTRAVENOUS

## 2023-09-29 MED ORDER — ACETAMINOPHEN 325 MG PO TABS
650.0000 mg | ORAL_TABLET | Freq: Four times a day (QID) | ORAL | Status: DC | PRN
  Administered 2023-09-29 – 2023-10-05 (×6): 650 mg via ORAL
  Filled 2023-09-29 (×7): qty 2

## 2023-09-29 MED ORDER — ALBUTEROL SULFATE (2.5 MG/3ML) 0.083% IN NEBU
2.5000 mg | INHALATION_SOLUTION | RESPIRATORY_TRACT | Status: DC | PRN
  Administered 2023-10-01 – 2023-10-04 (×2): 2.5 mg via RESPIRATORY_TRACT
  Filled 2023-09-29 (×2): qty 3

## 2023-09-29 MED ORDER — CLOPIDOGREL BISULFATE 75 MG PO TABS
75.0000 mg | ORAL_TABLET | Freq: Every day | ORAL | Status: DC
  Administered 2023-09-29 – 2023-10-05 (×7): 75 mg via ORAL
  Filled 2023-09-29 (×7): qty 1

## 2023-09-29 MED ORDER — EZETIMIBE 10 MG PO TABS
10.0000 mg | ORAL_TABLET | Freq: Every day | ORAL | Status: DC
Start: 2023-09-29 — End: 2023-10-05
  Administered 2023-09-29 – 2023-10-05 (×7): 10 mg via ORAL
  Filled 2023-09-29 (×7): qty 1

## 2023-09-29 MED ORDER — ONDANSETRON HCL 4 MG/2ML IJ SOLN: 4.0000 mg | Freq: Four times a day (QID) | INTRAMUSCULAR | Status: DC | PRN

## 2023-09-29 MED ORDER — PANTOPRAZOLE SODIUM 40 MG PO TBEC
40.0000 mg | DELAYED_RELEASE_TABLET | Freq: Every day | ORAL | Status: DC
  Administered 2023-09-29 – 2023-10-05 (×7): 40 mg via ORAL
  Filled 2023-09-29 (×7): qty 1

## 2023-09-29 MED ORDER — AMLODIPINE BESYLATE 5 MG PO TABS
5.0000 mg | ORAL_TABLET | Freq: Every day | ORAL | Status: DC
  Administered 2023-09-29 – 2023-10-05 (×7): 5 mg via ORAL
  Filled 2023-09-29 (×7): qty 1

## 2023-09-29 MED ORDER — SPIRONOLACTONE 12.5 MG HALF TABLET
12.5000 mg | ORAL_TABLET | Freq: Every day | ORAL | Status: DC
  Administered 2023-09-29 – 2023-10-05 (×7): 12.5 mg via ORAL
  Filled 2023-09-29 (×7): qty 1

## 2023-09-29 MED ORDER — TRAMADOL HCL 50 MG PO TABS
25.0000 mg | ORAL_TABLET | Freq: Two times a day (BID) | ORAL | Status: DC | PRN
  Administered 2023-09-30 – 2023-10-01 (×2): 25 mg via ORAL
  Filled 2023-09-29 (×2): qty 1

## 2023-09-29 MED ORDER — ISOSORBIDE MONONITRATE ER 30 MG PO TB24
30.0000 mg | ORAL_TABLET | Freq: Every day | ORAL | Status: DC
Start: 2023-09-29 — End: 2023-10-05
  Administered 2023-09-29 – 2023-10-05 (×7): 30 mg via ORAL
  Filled 2023-09-29 (×7): qty 1

## 2023-09-29 MED ORDER — BISACODYL 5 MG PO TBEC: 5.0000 mg | DELAYED_RELEASE_TABLET | Freq: Every day | ORAL | Status: DC | PRN

## 2023-09-29 MED ORDER — POLYETHYLENE GLYCOL 3350 17 G PO PACK: 17.0000 g | PACK | Freq: Every day | ORAL | Status: DC | PRN

## 2023-09-29 MED ORDER — LACTATED RINGERS IV SOLN: INTRAVENOUS | Status: DC

## 2023-09-29 MED ORDER — ALLOPURINOL 100 MG PO TABS
100.0000 mg | ORAL_TABLET | Freq: Every day | ORAL | Status: DC
  Administered 2023-09-29 – 2023-10-05 (×7): 100 mg via ORAL
  Filled 2023-09-29 (×7): qty 1

## 2023-09-29 MED ORDER — DOXYCYCLINE HYCLATE 100 MG PO TABS
100.0000 mg | ORAL_TABLET | Freq: Once | ORAL | Status: AC
  Administered 2023-09-29: 100 mg via ORAL
  Filled 2023-09-29: qty 1

## 2023-09-29 MED ORDER — MORPHINE SULFATE (PF) 2 MG/ML IV SOLN: 2.0000 mg | INTRAVENOUS | Status: DC | PRN

## 2023-09-29 MED ORDER — DOCUSATE SODIUM 100 MG PO CAPS
100.0000 mg | ORAL_CAPSULE | Freq: Two times a day (BID) | ORAL | Status: DC
  Administered 2023-09-29 – 2023-10-05 (×9): 100 mg via ORAL
  Filled 2023-09-29 (×12): qty 1

## 2023-09-29 MED ORDER — LOSARTAN POTASSIUM 50 MG PO TABS
100.0000 mg | ORAL_TABLET | Freq: Every day | ORAL | Status: DC
  Administered 2023-09-29 – 2023-10-05 (×7): 100 mg via ORAL
  Filled 2023-09-29 (×8): qty 2

## 2023-09-29 MED ORDER — ACETAMINOPHEN 650 MG RE SUPP: 650.0000 mg | Freq: Four times a day (QID) | RECTAL | Status: DC | PRN

## 2023-09-29 MED ORDER — IPRATROPIUM-ALBUTEROL 0.5-2.5 (3) MG/3ML IN SOLN
3.0000 mL | Freq: Once | RESPIRATORY_TRACT | Status: AC
  Administered 2023-09-29: 3 mL via RESPIRATORY_TRACT
  Filled 2023-09-29: qty 3

## 2023-09-29 MED ORDER — ENOXAPARIN SODIUM 40 MG/0.4ML IJ SOSY
40.0000 mg | PREFILLED_SYRINGE | INTRAMUSCULAR | Status: DC
  Administered 2023-09-29 – 2023-10-05 (×7): 40 mg via SUBCUTANEOUS
  Filled 2023-09-29 (×4): qty 0.4

## 2023-09-29 MED ORDER — SODIUM CHLORIDE 0.9 % IV SOLN
2.0000 g | INTRAVENOUS | Status: AC
  Administered 2023-09-29 – 2023-10-03 (×5): 2 g via INTRAVENOUS
  Filled 2023-09-29 (×5): qty 20

## 2023-09-29 MED ORDER — SODIUM CHLORIDE 0.9% FLUSH
3.0000 mL | Freq: Two times a day (BID) | INTRAVENOUS | Status: DC
  Administered 2023-09-29 – 2023-10-04 (×12): 3 mL via INTRAVENOUS

## 2023-09-29 MED ORDER — DOXYCYCLINE HYCLATE 100 MG IV SOLR
100.0000 mg | Freq: Two times a day (BID) | INTRAVENOUS | Status: DC
  Administered 2023-09-29 – 2023-10-05 (×12): 100 mg via INTRAVENOUS
  Filled 2023-09-29 (×12): qty 100

## 2023-09-29 MED ORDER — ONDANSETRON HCL 4 MG PO TABS
4.0000 mg | ORAL_TABLET | Freq: Four times a day (QID) | ORAL | Status: DC | PRN
  Administered 2023-10-01: 4 mg via ORAL
  Filled 2023-09-29: qty 1

## 2023-09-29 MED ORDER — PRAVASTATIN SODIUM 40 MG PO TABS
40.0000 mg | ORAL_TABLET | Freq: Every day | ORAL | Status: DC
  Administered 2023-09-29 – 2023-10-04 (×6): 40 mg via ORAL
  Filled 2023-09-29 (×6): qty 1

## 2023-09-29 MED ORDER — METOPROLOL SUCCINATE ER 100 MG PO TB24
100.0000 mg | ORAL_TABLET | Freq: Every day | ORAL | Status: DC
Start: 2023-09-29 — End: 2023-10-05
  Administered 2023-09-29 – 2023-10-05 (×7): 100 mg via ORAL
  Filled 2023-09-29 (×3): qty 1
  Filled 2023-09-29: qty 4
  Filled 2023-09-29 (×3): qty 1

## 2023-09-29 MED ORDER — HYDRALAZINE HCL 20 MG/ML IJ SOLN
5.0000 mg | INTRAMUSCULAR | Status: DC | PRN
  Administered 2023-10-01 – 2023-10-05 (×2): 5 mg via INTRAVENOUS
  Filled 2023-09-29 (×2): qty 1

## 2023-09-29 MED ORDER — SODIUM CHLORIDE 0.9 % IV SOLN: INTRAVENOUS | Status: DC

## 2023-09-29 MED ORDER — GUAIFENESIN ER 600 MG PO TB12
600.0000 mg | ORAL_TABLET | Freq: Two times a day (BID) | ORAL | Status: DC | PRN
  Administered 2023-10-02: 600 mg via ORAL
  Filled 2023-09-29: qty 1

## 2023-09-29 NOTE — ED Notes (Signed)
Increased [place] to 3lpm due to Pt's O2 saturation dropping while talking to Dr. Theresia Lo.

## 2023-09-29 NOTE — ED Provider Notes (Signed)
[place] EMERGENCY DEPARTMENT AT [facility] Provider Note   CSN: [id number] Arrival date & time: 09/29/23  4098     History  Chief Complaint  Patient presents with   Shortness of Breath    Hx: CHF/CAD/COPD    Tracy [name] is a 77 y.o. female.  Patient is a 77 year old female with a past medical history of CHF, CAD, CKD, COPD presenting to the emergency department with shortness of breath.  The patient states that she has had cold-like symptoms for the last several days with cough and congestion and last night/early this morning started to have increasing shortness of breath.  She states that she lost her inhaler so she has not been using this at home.  She denies any fevers, nausea or vomiting.  Patient also reports that she has edema in her legs and has a wound on her left leg that has been draining more than usual.  She states that it was seen by her primary doctor and has been monitored for the last week and she has been doing dressing changes at home.  States that she does feel like it is getting worse.  She denies any history of VTE, any recent hospitalizations or surgeries or blood thinner use.  The history is provided by the patient.  Shortness of Breath      Home Medications Prior to Admission medications   Medication Sig Start Date End Date Taking? Authorizing Provider  acetaminophen (TYLENOL) 500 MG tablet Take 500-1,000 mg by mouth every 6 (six) hours as needed for headache.     [provider]  allopurinol (ZYLOPRIM) 100 MG tablet TAKE 1 TABLET BY MOUTH EVERY DAY 06/05/23   [place], [name], MD  amLODipine (NORVASC) 5 MG tablet TAKE 1 TABLET (5 MG TOTAL) BY MOUTH DAILY. 09/26/22   Tonny Bollman, MD  APPLE CIDER VINEGAR PO Take 2,400 mg by mouth daily.    [provider]  ASPERCREME LIDOCAINE EX Apply 1 application topically daily as needed (pain).    [provider]  aspirin EC 81 MG tablet Take 81 mg by mouth daily.     [provider]  Cholecalciferol (VITAMIN D3) 50 MCG (2000 UT) TABS Take 4,000 Units by mouth daily.     [provider]  clopidogrel (PLAVIX) 75 MG tablet TAKE 1 TABLET BY MOUTH EVERY DAY WITH BREAKFAST 02/07/23   Tonny Bollman, MD  Cyanocobalamin (VITAMIN B-12) 5000 MCG SUBL Place 5,000 mcg under the tongue daily.     [provider]  estradiol (ESTRACE VAGINAL) 0.1 MG/GM vaginal cream Apply a pea sized amount just inside the vagina nightly for 2 weeks, then twice weekly thereafter 09/12/23   Lennart Pall, MD  ezetimibe (ZETIA) 10 MG tablet Take 1 tablet (10 mg total) by mouth daily. 06/13/23   Tonny Bollman, MD  fluticasone [facility]) 50 MCG/ACT nasal spray SPRAY 2 SPRAYS INTO EACH NOSTRIL EVERY DAY 02/20/20   Leslye Peer, MD  furosemide (LASIX) 40 MG tablet Take 40 mg by mouth daily. Patient not taking: Reported on 08/07/2023    [provider]  guaiFENesin (MUCINEX) 600 MG 12 hr tablet Take 600 mg by mouth 2 (two) times daily as needed (congestion).    [provider]  isosorbide mononitrate (IMDUR) 30 MG 24 hr tablet TAKE 1 TABLET BY MOUTH EVERY DAY 04/10/23   Tereso Newcomer T, PA-C  losartan (COZAAR) 100 MG tablet Take 1 tablet (100 mg total) by mouth daily. 07/03/23  [place], [name], MD  meclizine (ANTIVERT) 25 MG tablet Take 1 tablet (25 mg total) by mouth every 6 (six) hours as needed for dizziness. 05/10/23   Nelwyn Salisbury, MD  metoprolol succinate (TOPROL-XL) 100 MG 24 hr tablet TAKE 1 TABLET BY MOUTH EVERY DAY WITH OR IMMEDIATELY FOLLOWING A MEAL 02/07/23   Tonny Bollman, MD  Misc Natural Products [facility] ADVANCED) CAPS Take 1,000 capsules by mouth daily.     [provider]  nitroGLYCERIN (NITROSTAT) 0.4 MG SL tablet PLACE 1 TABLET UNDER THE TONGUE EVERY 5 MINUTES AS NEEDED FOR CHEST PAIN 05/17/22   Tonny Bollman, MD  nystatin cream (MYCOSTATIN) Apply 1 Application topically 2 (two) times daily. 01/02/23   [place], Betty  G, MD  pantoprazole (PROTONIX) 40 MG tablet TAKE 1 TABLET BY MOUTH EVERY DAY 12/30/22   [place], [name], MD  potassium chloride (KLOR-CON M) 10 MEQ tablet Take 1 tablet (10 mEq total) by mouth daily. 08/07/23   [place], [name], MD  pravastatin (PRAVACHOL) 40 MG tablet TAKE 1 TABLET BY MOUTH EVERY DAY IN THE EVENING 02/07/23   Tonny Bollman, MD  Simethicone (GAS-X PO) Take 2 tablets by mouth daily as needed (gas).    [provider]  spironolactone (ALDACTONE) 25 MG tablet Take 12.5 mg by mouth daily. 06/17/23   [provider]  terconazole (TERAZOL 7) 0.4 % vaginal cream Place 1 applicator vaginally at bedtime. 04/07/23   [place], [name], MD  traMADol (ULTRAM) 50 MG tablet Take 0.5 tablets (25 mg total) by mouth every 12 (twelve) hours as needed. 06/06/23   [place], [name], MD  triamcinolone cream (KENALOG) 0.1 % Apply topically daily as needed (for leg dermatitis.). 08/07/23   [place], [name], MD      Allergies    Erythromycin and Sulfamethoxazole    Review of Systems   Review of Systems  Respiratory:  Positive for shortness of breath.     Physical Exam Updated Vital Signs BP (!) 147/89   Pulse 96   Temp 98.2 F (36.8 C)   Resp (!) 22   SpO2 94%  Physical Exam Vitals and nursing note reviewed.  Constitutional:      Appearance: She is well-developed. She is ill-appearing. She is not toxic-appearing.  HENT:     Head: Normocephalic and atraumatic.     Mouth/Throat:     Mouth: Mucous membranes are moist.  Eyes:     Extraocular Movements: Extraocular movements intact.  Cardiovascular:     Rate and Rhythm: Regular rhythm. Tachycardia present.  Pulmonary:     Effort: Tachypnea present. No accessory muscle usage or respiratory distress.     Breath sounds: Examination of the right-lower field reveals rales. Examination of the left-lower field reveals rales. Wheezing (trace end-expiratory) and rales present.     Comments: Speaking in full sentences Abdominal:      Palpations: Abdomen is soft.     Tenderness: There is no abdominal tenderness.  Musculoskeletal:        General: Normal range of motion.     Cervical back: Normal range of motion and neck supple.     Right lower leg: Edema (Non-pitting) present.     Left lower leg: Edema (non-pitting, chronic skin changes, mild erythema and wamth, no significant tenderness) present.  Skin:    General: Skin is warm and dry.  Neurological:     General: No focal deficit present.     Mental Status: She is alert and oriented to  person, place, and time.  Psychiatric:        Mood and Affect: Mood normal.        Behavior: Behavior normal.     ED Results / Procedures / Treatments   Labs (all labs ordered are listed, but only abnormal results are displayed) Labs Reviewed  BASIC METABOLIC PANEL - Abnormal; Notable for the following components:      Result Value   Glucose, Bld 178 (*)    Creatinine, Ser 1.10 (*)    GFR, Estimated 52 (*)    All other components within normal limits  CBC - Abnormal; Notable for the following components:   WBC 11.9 (*)    RDW 15.8 (*)    All other components within normal limits  BRAIN NATRIURETIC PEPTIDE - Abnormal; Notable for the following components:   B Natriuretic Peptide 507.0 (*)    All other components within normal limits  CULTURE, BLOOD (ROUTINE X 2)  CULTURE, BLOOD (ROUTINE X 2)  SARS CORONAVIRUS 2 BY RT PCR  PROTIME-INR  I-STAT CG4 LACTIC ACID, ED  TROPONIN I (HIGH SENSITIVITY)  TROPONIN I (HIGH SENSITIVITY)    EKG EKG Interpretation Date/Time:  Friday September 29 2023 06:42:25 EST Ventricular Rate:  112 PR Interval:  158 QRS Duration:  96 QT Interval:  354 QTC Calculation: 483 R Axis:   8  Text Interpretation: Sinus tachycardia Otherwise normal ECG No significant change since last tracing Confirmed by Elayne Snare ([id number]) on 09/29/2023 7:02:59 AM  Radiology DG Chest 2 View Result Date: 09/29/2023 CLINICAL DATA:  77 year old female with  history of shortness of breath. EXAM: CHEST - 2 VIEW COMPARISON:  Chest x-ray 10/11/2022. FINDINGS: Lung volumes are normal. Diffuse interstitial prominence and widespread peribronchial cuffing with some patchy ill-defined opacities scattered throughout the lungs bilaterally. Small bilateral pleural effusions. No pneumothorax. Pulmonary vasculature is largely obscured. Heart size appears borderline enlarged. The patient is rotated to the left on today's exam, resulting in distortion of the mediastinal contours and reduced diagnostic sensitivity and specificity for mediastinal pathology. Atherosclerotic calcifications are noted in the thoracic aorta. IMPRESSION: 1. The appearance the chest suggests severe bronchitis likely with developing multilobar bilateral bronchopneumonia. 2. Small bilateral pleural effusions. 3. Aortic atherosclerosis. Electronically Signed   By: Trudie Reed M.D.   On: 09/29/2023 06:55    Procedures .Critical Care  Performed by: Rexford Maus, DO Authorized by: Rexford Maus, DO   Critical care provider statement:    Critical care time (minutes):  35   Critical care time was exclusive of:  Separately billable procedures and treating other patients   Critical care was necessary to treat or prevent imminent or life-threatening deterioration of the following conditions:  Respiratory failure and sepsis   Critical care was time spent personally by me on the following activities:  Development of treatment plan with patient or surrogate, discussions with consultants, evaluation of patient's response to treatment, examination of patient, obtaining history from patient or surrogate, ordering and performing treatments and interventions, ordering and review of laboratory studies, ordering and review of radiographic studies, review of old charts, re-evaluation of patient's condition and pulse oximetry   I assumed direction of critical care for this patient from another  provider in my specialty: no     Care discussed with: admitting provider       Medications Ordered in ED Medications  lactated ringers infusion (has no administration in time range)  lactated ringers bolus 500 mL (500 mLs Intravenous New Bag/Given 09/29/23 0753)  cefTRIAXone (ROCEPHIN) 2 g in sodium chloride 0.9 % 100 mL IVPB (0 g Intravenous Stopped 09/29/23 0817)  doxycycline (VIBRA-TABS) tablet 100 mg (100 mg Oral Given 09/29/23 0747)  ipratropium-albuterol (DUONEB) 0.5-2.5 (3) MG/3ML nebulizer solution 3 mL (3 mLs Nebulization Given 09/29/23 0749)    ED Course/ Medical Decision Making/ A&P Clinical Course as of 09/29/23 0825  Fri Sep 29, 2023  0820 Upon reassessment, patient's wheezing has resolved and she reports improvement of her shortness of breath.  Patient will be admitted for her hypoxia in the setting of pneumonia. [VK]    Clinical Course User Index [VK] Rexford Maus, DO                                 Medical Decision Making This patient presents to the ED with chief complaint(s) of shortness of breath with pertinent past medical history of CHF, COPD, CAD, CKD which further complicates the presenting complaint. The complaint involves an extensive differential diagnosis and also carries with it a high risk of complications and morbidity.    The differential diagnosis includes ACS, arrhythmia, anemia, pneumonia, pneumothorax, pulmonary edema, pleural effusion, COPD exacerbation, viral syndrome, sepsis, cellulitis  Additional history obtained: Additional history obtained from family Records reviewed Primary Care Documents  ED Course and Reassessment: On patient's arrival she was tachycardic tachypneic and hypoxic.  She was immediately brought back to room and placed on 2 L nasal cannula and reports improvement of her shortness of breath.  She still tachypneic on exam satting in the low 90s.  She does have crackles at the bases and trace end expiratory wheeze.  She  had labs ordered, EKG and chest x-ray from triage.  EKG showed sinus tachycardia without acute ischemic changes.  Chest x-ray shows concern for bronchitis with bilateral basilar bronchopneumonia.  The patient will be given DuoNeb and will be started on antibiotics and undergo further sepsis workup.  Troponin and BNP are additionally pending at this time.  Independent labs interpretation:  The following labs were independently interpreted: leukocytosis, otherwise within normal range  Independent visualization of imaging: - I independently visualized the following imaging with scope of interpretation limited to determining acute life threatening conditions related to emergency care: CXR, which revealed bronchopneumonia with small bilateral pleural effusions  Consultation: - Consulted or discussed management/test interpretation w/ external professional: hospitalist  Consideration for admission or further workup: patient requires admission for her pneumonia and hypoxia Social Determinants of health: N/A    Amount and/or Complexity of Data Reviewed Labs: ordered. Radiology: ordered.  Risk Prescription drug management.          Final Clinical Impression(s) / ED Diagnoses Final diagnoses:  Bronchopneumonia  Hypoxia  Wound of left lower extremity, initial encounter    Rx / DC Orders ED Discharge Orders     None         Rexford Maus, DO 09/29/23 0825

## 2023-09-29 NOTE — ED Notes (Signed)
Pt placed on a bedpan.  

## 2023-09-29 NOTE — Consult Note (Signed)
WOC Nurse Consult Note: Reason for Consult: Requested to assess LLE wound. Performed remotely after photos and notes. Wound type: Lymphedema ulcer Leg is red, edematous and leaking. According the Pt, the wound more tham she expected. Periwound: edema, with crust, red. Dressing procedure/placement/frequency: Apply Xeroform on the wound bed, cover with gauze or ABD pad.  Wrap with Kerlix and ACE wrap, starting on the bottom of the toes, until bellow the knee. Change everyday.  WOC team will not plan to follow further.  Please reconsult if further assistance is needed. Thank-you,  Denyse Amass BSN, RN, [employer], WOC  (Pager: [phone])

## 2023-09-29 NOTE — ED Triage Notes (Signed)
Patient reports increasing SOB with chest tightness , productive cough and chest congestion onset last night . Her cardiologist is Dr. Excell Seltzer . History of CAD/CHF/COPD.

## 2023-09-29 NOTE — ED Notes (Signed)
Oxygen 85 on RA RN notified

## 2023-09-29 NOTE — Sepsis Progress Note (Signed)
Sepsis protocol monitored by eLink ?

## 2023-09-29 NOTE — ED Notes (Signed)
ED TO INPATIENT HANDOFF REPORT  ED Nurse Name and Phone #: Dahlia Client [phone]  S Name/Age/Gender Tracy [name] 77 y.o. female Room/Bed: 030C/030C  Code Status   Code Status: Full Code  Home/SNF/Other Home Patient oriented to: self, place, time, and situation Is this baseline? Yes   Triage Complete: Triage complete  Chief Complaint Multifocal pneumonia [J18.9]  Triage Note Patient reports increasing SOB with chest tightness , productive cough and chest congestion onset last night . Her cardiologist is Dr. Excell Seltzer . History of CAD/CHF/COPD.    Allergies Allergies  Allergen Reactions   Ak-Mycin [Erythromycin] Rash   Sulfamethoxazole Rash    Level of Care/Admitting Diagnosis ED Disposition     ED Disposition  Admit   Condition  --   Comment  Hospital Area: [facility] [[id number]]  Level of Care: Telemetry Medical [104]  May admit patient to [facility] or [facility] if equivalent level of care is available:: Yes  Covid Evaluation: Symptomatic Person Under Investigation (PUI) or recent exposure (last 10 days) *Testing Required*  Diagnosis: Multifocal pneumonia [7846962]  Admitting Physician: Jonah Blue [[id number]]  Attending Physician: Jonah Blue [[id number]]  Certification:: I certify this patient will need inpatient services for at least 2 midnights  Expected Medical Readiness: 10/01/2023          B Medical/Surgery History Past Medical History:  Diagnosis Date   Allergic rhinitis    Anxiety    Arthritis    Ascending aorta dilation (HCC) 09/13/2022   Echo 09/12/2022: EF 50-55, GR 1 DD, mildly reduced RVSF, normal PASP (RVSP 17.7), mild dilation of ascending aorta (38 mm), RAP 3   Breast cancer (HCC) 04/28/2020   rigtht breast   Bronchitis    CAD (coronary artery disease)    1/19 PCI/DES to pLCX for ISR, normal EF.    Cataract    Cervical dysplasia    unsure of procedure, possible "burning" in her late 66s   CHF (congestive heart  failure) (HCC)    Echo 06/2019: EF 55-60, elevated LVEDP, normal RV SF, mild MAC, mild MR, trivial TR   Complication of anesthesia    COPD (chronic obstructive pulmonary disease) (HCC)    early    Depression    GERD (gastroesophageal reflux disease)    Gout    Hyperlipidemia    Hypertension    Low back pain    Overactive bladder    PONV (postoperative nausea and vomiting)    Sleep apnea    Past Surgical History:  Procedure Laterality Date   ANGIOPLASTY     stent 2007   BREAST EXCISIONAL BIOPSY Right 04/2020   ADH/LCIS   BREAST LUMPECTOMY WITH RADIOACTIVE SEED LOCALIZATION Right 04/28/2020   Procedure: RIGHT BREAST LUMPECTOMY X 2  WITH RADIOACTIVE SEED LOCALIZATION;  Surgeon: Manus Rudd, MD;  Location: [facility];  Service: General;  Laterality: Right;  LMA   CARDIAC CATHETERIZATION     COLONOSCOPY     CORONARY STENT INTERVENTION N/A 11/06/2017   Procedure: CORONARY STENT INTERVENTION;  Surgeon: Yvonne Kendall, MD;  Location: [facility] INVASIVE CV [facility];  Service: Cardiovascular;  Laterality: N/A;   LEFT HEART CATH AND CORONARY ANGIOGRAPHY N/A 11/06/2017   Procedure: LEFT HEART CATH AND CORONARY ANGIOGRAPHY;  Surgeon: Yvonne Kendall, MD;  Location: [facility];  Service: Cardiovascular;  Laterality: N/A;   LEFT HEART CATH AND CORONARY ANGIOGRAPHY N/A 07/24/2019   Procedure: LEFT HEART CATH AND CORONARY ANGIOGRAPHY;  Surgeon: Kathleene Hazel, MD;  Location: [facility]  INVASIVE CV LAB;  Service: Cardiovascular;  Laterality: N/A;   RIGHT/LEFT HEART CATH AND CORONARY ANGIOGRAPHY N/A 09/29/2020   Procedure: RIGHT/LEFT HEART CATH AND CORONARY ANGIOGRAPHY;  Surgeon: Lyn Records, MD;  Location: [facility];  Service: Cardiovascular;  Laterality: N/A;   ROBOTIC ASSISTED BILATERAL SALPINGO OOPHERECTOMY Bilateral 11/10/2020   Procedure: XI ROBOTIC ASSISTED BILATERAL SALPINGO OOPHORECTOMY WITH MINI LAPAROTOMY FOR DRAINAGE;  Surgeon: Carver Fila, MD;   Location: [facility];  Service: Gynecology;  Laterality: Bilateral;  MINI LAP FIRST   TONSILLECTOMY     VULVECTOMY N/A 11/10/2020   Procedure: WIDE EXCISION VULVECTOMY;  Surgeon: Carver Fila, MD;  Location: [facility];  Service: Gynecology;  Laterality: N/A;     A IV Location/Drains/Wounds Patient Lines/Drains/Airways Status     Active Line/Drains/Airways     Name Placement date Placement time Site Days   Peripheral IV 09/29/23 20 G Right Antecubital 09/29/23  0722  Antecubital  less than 1   Peripheral IV 09/29/23 20 G Left;Posterior Hand 09/29/23  0743  Hand  less than 1   Incision - 4 Ports Abdomen Left;Lateral;Lower Left;Lateral;Upper Right;Lateral;Lower Right;Lateral;Upper 11/10/20  0809  -- 1053            Intake/Output Last 24 hours No intake or output data in the 24 hours ending 09/29/23 1125  Labs/Imaging Results for orders placed or performed during the hospital encounter of 09/29/23 (from the past 48 hours)  Basic metabolic panel     Status: Abnormal   Collection Time: 09/29/23  6:39 AM  Result Value Ref Range   Sodium 141 135 - 145 mmol/L   Potassium 3.9 3.5 - 5.1 mmol/L   Chloride 108 98 - 111 mmol/L   CO2 22 22 - 32 mmol/L   Glucose, Bld 178 (H) 70 - 99 mg/dL    Comment: Glucose reference range applies only to samples taken after fasting for at least 8 hours.   BUN 16 8 - 23 mg/dL   Creatinine, Ser 1.91 (H) 0.44 - 1.00 mg/dL   Calcium 9.1 8.9 - 47.8 mg/dL   GFR, Estimated 52 (L) >60 mL/min    Comment: (NOTE) Calculated using the CKD-EPI Creatinine Equation (2021)    Anion gap 11 5 - 15    Comment: Performed at [facility], [address], [place], [place] [address]  CBC     Status: Abnormal   Collection Time: 09/29/23  6:39 AM  Result Value Ref Range   WBC 11.9 (H) 4.0 - 10.5 K/uL   RBC 4.95 3.87 - 5.11 MIL/uL   Hemoglobin 13.1 12.0 - 15.0 g/dL   HCT 13.0 86.5 - 78.4 %   MCV 87.1 80.0 - 100.0 fL   MCH 26.5 26.0 - 34.0 pg   MCHC 30.4 30.0 -  36.0 g/dL   RDW 69.6 (H) 29.5 - 28.4 %   Platelets 273 150 - 400 K/uL   nRBC 0.0 0.0 - 0.2 %    Comment: Performed at [facility], [address], [place], [place] [address]  Troponin I (High Sensitivity)     Status: None   Collection Time: 09/29/23  6:39 AM  Result Value Ref Range   Troponin I (High Sensitivity) 14 <18 ng/L    Comment: (NOTE) Elevated high sensitivity troponin I (hsTnI) values and significant  changes across serial measurements may suggest ACS but many other  chronic and acute conditions are known to elevate hsTnI results.  Refer to the "Links" section  for chest pain algorithms and additional  guidance. Performed at [facility], [address] [place], [place] [address]   Brain natriuretic peptide     Status: Abnormal   Collection Time: 09/29/23  6:39 AM  Result Value Ref Range   B Natriuretic Peptide 507.0 (H) 0.0 - 100.0 pg/mL    Comment: Performed at [facility], [address], [place], [place] [address]  Protime-INR (order if Patient is taking Coumadin / Warfarin)     Status: None   Collection Time: 09/29/23  6:39 AM  Result Value Ref Range   Prothrombin Time 13.6 11.4 - 15.2 seconds   INR 1.0 0.8 - 1.2    Comment: (NOTE) INR goal varies based on device and disease states. Performed at [facility], [address], [place], [place] [address]   SARS Coronavirus 2 by RT PCR (hospital order, performed in [facility] hospital lab) *cepheid single result test* Anterior Nasal Swab     Status: None   Collection Time: 09/29/23  7:21 AM   Specimen: Anterior Nasal Swab  Result Value Ref Range   SARS Coronavirus 2 by RT PCR NEGATIVE NEGATIVE    Comment: Performed at [facility], [address] [place], [place] [address]  I-Stat CG4 Lactic Acid     Status: None   Collection Time: 09/29/23  7:34 AM  Result Value Ref Range   Lactic Acid, Venous 1.2 0.5 - 1.9 mmol/L   DG Chest 2 View Result Date: 09/29/2023 CLINICAL DATA:   77 year old female with history of shortness of breath. EXAM: CHEST - 2 VIEW COMPARISON:  Chest x-ray 10/11/2022. FINDINGS: Lung volumes are normal. Diffuse interstitial prominence and widespread peribronchial cuffing with some patchy ill-defined opacities scattered throughout the lungs bilaterally. Small bilateral pleural effusions. No pneumothorax. Pulmonary vasculature is largely obscured. Heart size appears borderline enlarged. The patient is rotated to the left on today's exam, resulting in distortion of the mediastinal contours and reduced diagnostic sensitivity and specificity for mediastinal pathology. Atherosclerotic calcifications are noted in the thoracic aorta. IMPRESSION: 1. The appearance the chest suggests severe bronchitis likely with developing multilobar bilateral bronchopneumonia. 2. Small bilateral pleural effusions. 3. Aortic atherosclerosis. Electronically Signed   By: Trudie Reed M.D.   On: 09/29/2023 06:55    Pending Labs Unresulted Labs (From admission, onward)     Start     Ordered   09/29/23 1109  Expectorated Sputum Assessment w Gram Stain, Rflx to Resp Cult  (COPD / Pneumonia / Cellulitis / Lower Extremity Wound)  Once,   R        09/29/23 1110   09/29/23 1109  Strep pneumoniae urinary antigen  (COPD / Pneumonia / Cellulitis / Lower Extremity Wound)  Once,   R        09/29/23 1110   09/29/23 0714  Blood culture (routine x 2)  BLOOD CULTURE X 2,   R (with STAT occurrences)      09/29/23 0715            Vitals/Pain Today's Vitals   09/29/23 0900 09/29/23 0915 09/29/23 1030 09/29/23 1045  BP: (!) 145/83 (!) 145/81 (!) 147/104 139/81  Pulse: 84 86 96 94  Resp: 17 18 (!) 22 14  Temp:      SpO2: 95% 97% 98% 96%  PainSc:        Isolation Precautions No active isolations  Medications Medications  cefTRIAXone (ROCEPHIN) 2 g in sodium chloride 0.9 % 100  mL IVPB (0 g Intravenous Stopped 09/29/23 0817)  allopurinol (ZYLOPRIM) tablet 100 mg (has no  administration in time range)  traMADol (ULTRAM) tablet 25 mg (has no administration in time range)  amLODipine (NORVASC) tablet 5 mg (has no administration in time range)  ezetimibe (ZETIA) tablet 10 mg (has no administration in time range)  isosorbide mononitrate (IMDUR) 24 hr tablet 30 mg (has no administration in time range)  losartan (COZAAR) tablet 100 mg (has no administration in time range)  metoprolol succinate (TOPROL-XL) 24 hr tablet 100 mg (has no administration in time range)  pravastatin (PRAVACHOL) tablet 40 mg (has no administration in time range)  spironolactone (ALDACTONE) tablet 12.5 mg (has no administration in time range)  pantoprazole (PROTONIX) EC tablet 40 mg (has no administration in time range)  clopidogrel (PLAVIX) tablet 75 mg (has no administration in time range)  enoxaparin (LOVENOX) injection 40 mg (has no administration in time range)  0.9 %  sodium chloride infusion (has no administration in time range)  acetaminophen (TYLENOL) tablet 650 mg (has no administration in time range)    Or  acetaminophen (TYLENOL) suppository 650 mg (has no administration in time range)  morphine (PF) 2 MG/ML injection 2 mg (has no administration in time range)  docusate sodium (COLACE) capsule 100 mg (has no administration in time range)  polyethylene glycol (MIRALAX / GLYCOLAX) packet 17 g (has no administration in time range)  bisacodyl (DULCOLAX) EC tablet 5 mg (has no administration in time range)  ondansetron (ZOFRAN) tablet 4 mg (has no administration in time range)    Or  ondansetron (ZOFRAN) injection 4 mg (has no administration in time range)  albuterol (PROVENTIL) (2.5 MG/3ML) 0.083% nebulizer solution 2.5 mg (has no administration in time range)  guaiFENesin (MUCINEX) 12 hr tablet 600 mg (has no administration in time range)  hydrALAZINE (APRESOLINE) injection 5 mg (has no administration in time range)  doxycycline (VIBRAMYCIN) 100 mg in dextrose 5 % 250 mL IVPB (has  no administration in time range)  sodium chloride flush (NS) 0.9 % injection 3 mL (has no administration in time range)  lactated ringers bolus 500 mL (0 mLs Intravenous Stopped 09/29/23 0829)  doxycycline (VIBRA-TABS) tablet 100 mg (100 mg Oral Given 09/29/23 0747)  ipratropium-albuterol (DUONEB) 0.5-2.5 (3) MG/3ML nebulizer solution 3 mL (3 mLs Nebulization Given 09/29/23 0749)    Mobility walks with device     Focused Assessments     R Recommendations: See Admitting Provider Note  Report given to:   Additional Notes:

## 2023-09-29 NOTE — Plan of Care (Signed)
  Problem: Activity: Goal: Ability to tolerate increased activity will improve Outcome: Progressing   Problem: Clinical Measurements: Goal: Ability to maintain a body temperature in the normal range will improve Outcome: Progressing   Problem: Respiratory: Goal: Ability to maintain adequate ventilation will improve Outcome: Progressing Goal: Ability to maintain a clear airway will improve Outcome: Progressing   

## 2023-09-29 NOTE — H&P (Signed)
History and Physical    Patient: Tracy [name] ION:[id number] DOB: Mar 18, 1946 DOA: 09/29/2023 DOS: the patient was seen and examined on 09/29/2023 PCP: [place], [name], MD  Patient coming from: Home - lives with husband; NOK: [place], [place], [phone]   Chief Complaint: SOB/CP  HPI: Tracy [name] is a 77 y.o. female with medical history significant of breast cancer, CAD s/p stent, chronic diastolic CHF, COPD, HTN, HLD, depression/anxiety, and OSA presenting with SOB/CP. She reports acute onset of SOB early this AM, about 0530.  She was sitting on the couch.  She has had some congestion, over the last week.  No fever.  +cough, productive of clear phlegm.  She feels much better on the O2.    She has chronic stasis with dermatitis but the left leg started weeping a few days ago, better today.   ER Course:   SOB with cough, congestion.  Tachypnea, hypoxia to 85%, on 2L with improvement.  CXR with developing multilobar PNA and small effusions.  Does not appear volume overload.  Given neb + abx, small IVF.  BMP 507, no baseline.     Review of Systems: As mentioned in the history of present illness. All other systems reviewed and are negative. Past Medical History:  Diagnosis Date   Allergic rhinitis    Anxiety    Arthritis    Ascending aorta dilation (HCC) 09/13/2022   Echo 09/12/2022: EF 50-55, GR 1 DD, mildly reduced RVSF, normal PASP (RVSP 17.7), mild dilation of ascending aorta (38 mm), RAP 3   Breast cancer (HCC) 04/28/2020   rigtht breast   Bronchitis    CAD (coronary artery disease)    1/19 PCI/DES to pLCX for ISR, normal EF.    Cataract    Cervical dysplasia    unsure of procedure, possible "burning" in her late 32s   CHF (congestive heart failure) (HCC)    Echo 06/2019: EF 55-60, elevated LVEDP, normal RV SF, mild MAC, mild MR, trivial TR   Complication of anesthesia    COPD (chronic obstructive pulmonary disease) (HCC)    early    Depression     GERD (gastroesophageal reflux disease)    Gout    Hyperlipidemia    Hypertension    Low back pain    Overactive bladder    PONV (postoperative nausea and vomiting)    Sleep apnea    Past Surgical History:  Procedure Laterality Date   ANGIOPLASTY     stent 2007   BREAST EXCISIONAL BIOPSY Right 04/2020   ADH/LCIS   BREAST LUMPECTOMY WITH RADIOACTIVE SEED LOCALIZATION Right 04/28/2020   Procedure: RIGHT BREAST LUMPECTOMY X 2  WITH RADIOACTIVE SEED LOCALIZATION;  Surgeon: Manus Rudd, MD;  Location: [facility];  Service: General;  Laterality: Right;  LMA   CARDIAC CATHETERIZATION     COLONOSCOPY     CORONARY STENT INTERVENTION N/A 11/06/2017   Procedure: CORONARY STENT INTERVENTION;  Surgeon: Yvonne Kendall, MD;  Location: [facility];  Service: Cardiovascular;  Laterality: N/A;   LEFT HEART CATH AND CORONARY ANGIOGRAPHY N/A 11/06/2017   Procedure: LEFT HEART CATH AND CORONARY ANGIOGRAPHY;  Surgeon: Yvonne Kendall, MD;  Location: [facility];  Service: Cardiovascular;  Laterality: N/A;   LEFT HEART CATH AND CORONARY ANGIOGRAPHY N/A 07/24/2019   Procedure: LEFT HEART CATH AND CORONARY ANGIOGRAPHY;  Surgeon: Kathleene Hazel, MD;  Location: [facility];  Service: Cardiovascular;  Laterality: N/A;   RIGHT/LEFT HEART CATH AND CORONARY  ANGIOGRAPHY N/A 09/29/2020   Procedure: RIGHT/LEFT HEART CATH AND CORONARY ANGIOGRAPHY;  Surgeon: Lyn Records, MD;  [facility];  Service: Cardiovascular;  Laterality: N/A;   ROBOTIC ASSISTED BILATERAL SALPINGO OOPHERECTOMY Bilateral 11/10/2020   Procedure: XI ROBOTIC ASSISTED BILATERAL SALPINGO OOPHORECTOMY WITH MINI LAPAROTOMY FOR DRAINAGE;  Surgeon: Carver Fila, MD;  Location: [facility];  Service: Gynecology;  Laterality: Bilateral;  MINI LAP FIRST   TONSILLECTOMY     VULVECTOMY N/A 11/10/2020   Procedure: WIDE EXCISION VULVECTOMY;  Surgeon: Carver Fila, MD;  Location: [facility];   Service: Gynecology;  Laterality: N/A;   Social History:  reports that she quit smoking about 6 years ago. Her smoking use included cigarettes. She started smoking about 58 years ago. She has a 65 pack-year smoking history. She has never used smokeless tobacco. She reports current alcohol use. She reports that she does not use drugs.  Allergies  Allergen Reactions   Ak-Mycin [Erythromycin] Rash   Sulfamethoxazole Rash    Family History  Adopted: Yes  Problem Relation Age of Onset   Colon cancer Mother 27   Cancer Mother        colon   Depression Mother        Suicide.   Alzheimer's disease Maternal Uncle    Diabetes Daughter        type 2   Other Other        patient is adopted   Stomach cancer Neg Hx     Prior to Admission medications   Medication Sig Start Date End Date Taking? Authorizing Provider  acetaminophen (TYLENOL) 500 MG tablet Take 500-1,000 mg by mouth every 6 (six) hours as needed for headache.     [provider]  allopurinol (ZYLOPRIM) 100 MG tablet TAKE 1 TABLET BY MOUTH EVERY DAY 06/05/23   [place], [name], MD  amLODipine (NORVASC) 5 MG tablet TAKE 1 TABLET (5 MG TOTAL) BY MOUTH DAILY. 09/26/22   Tonny Bollman, MD  APPLE CIDER VINEGAR PO Take 2,400 mg by mouth daily.    [provider]  ASPERCREME LIDOCAINE EX Apply 1 application topically daily as needed (pain).    [provider]  aspirin EC 81 MG tablet Take 81 mg by mouth daily.    [provider]  Cholecalciferol (VITAMIN D3) 50 MCG (2000 UT) TABS Take 4,000 Units by mouth daily.     [provider]  clopidogrel (PLAVIX) 75 MG tablet TAKE 1 TABLET BY MOUTH EVERY DAY WITH BREAKFAST 02/07/23   Tonny Bollman, MD  Cyanocobalamin (VITAMIN B-12) 5000 MCG SUBL Place 5,000 mcg under the tongue daily.     [provider]  estradiol (ESTRACE VAGINAL) 0.1 MG/GM vaginal cream Apply a pea sized amount just inside the vagina nightly for 2 weeks, then twice weekly  thereafter 09/12/23   Lennart Pall, MD  ezetimibe (ZETIA) 10 MG tablet Take 1 tablet (10 mg total) by mouth daily. 06/13/23   Tonny Bollman, MD  fluticasone [address] [employer]) 50 MCG/ACT nasal spray SPRAY 2 SPRAYS INTO EACH NOSTRIL EVERY DAY 02/20/20   Leslye Peer, MD  furosemide (LASIX) 40 MG tablet Take 40 mg by mouth daily. Patient not taking: Reported on 08/07/2023    [provider]  guaiFENesin (MUCINEX) 600 MG 12 hr tablet Take 600 mg by mouth 2 (two) times daily as needed (congestion).    [provider]  isosorbide mononitrate (IMDUR) 30 MG 24 hr tablet TAKE 1 TABLET BY MOUTH EVERY  DAY 04/10/23   Tereso Newcomer T, PA-C  losartan (COZAAR) 100 MG tablet Take 1 tablet (100 mg total) by mouth daily. 07/03/23   [place], [name], MD  meclizine (ANTIVERT) 25 MG tablet Take 1 tablet (25 mg total) by mouth every 6 (six) hours as needed for dizziness. 05/10/23   Nelwyn Salisbury, MD  metoprolol succinate (TOPROL-XL) 100 MG 24 hr tablet TAKE 1 TABLET BY MOUTH EVERY DAY WITH OR IMMEDIATELY FOLLOWING A MEAL 02/07/23   Tonny Bollman, MD  Misc Natural Products [facility] ADVANCED) CAPS Take 1,000 capsules by mouth daily.     [provider]  nitroGLYCERIN (NITROSTAT) 0.4 MG SL tablet PLACE 1 TABLET UNDER THE TONGUE EVERY 5 MINUTES AS NEEDED FOR CHEST PAIN 05/17/22   Tonny Bollman, MD  nystatin cream (MYCOSTATIN) Apply 1 Application topically 2 (two) times daily. 01/02/23   [place], [name], MD  pantoprazole (PROTONIX) 40 MG tablet TAKE 1 TABLET BY MOUTH EVERY DAY 12/30/22   [place], [name], MD  potassium chloride (KLOR-CON M) 10 MEQ tablet Take 1 tablet (10 mEq total) by mouth daily. 08/07/23   [place], [name], MD  pravastatin (PRAVACHOL) 40 MG tablet TAKE 1 TABLET BY MOUTH EVERY DAY IN THE EVENING 02/07/23   Tonny Bollman, MD  Simethicone (GAS-X PO) Take 2 tablets by mouth daily as needed (gas).    [provider]  spironolactone (ALDACTONE) 25 MG tablet Take 12.5 mg by  mouth daily. 06/17/23   [provider]  terconazole (TERAZOL 7) 0.4 % vaginal cream Place 1 applicator vaginally at bedtime. 04/07/23   [place], [name], MD  traMADol (ULTRAM) 50 MG tablet Take 0.5 tablets (25 mg total) by mouth every 12 (twelve) hours as needed. 06/06/23   [place], [name], MD  triamcinolone cream (KENALOG) 0.1 % Apply topically daily as needed (for leg dermatitis.). 08/07/23   [place], [name], MD    Physical Exam: Vitals:   09/29/23 1100 09/29/23 1115 09/29/23 1130 09/29/23 1201  BP: 132/76 (!) 146/79 (!) 141/110 (!) 183/96  Pulse: 82 92 93 (!) 103  Resp: 19  (!) 23 18  Temp:    (!) 97.5 F (36.4 C)  TempSrc:    Oral  SpO2: 98% 98% 96% 95%   General:  Appears calm and comfortable and is in NAD, on [place] O2 Eyes:  PERRL, EOMI, normal lids, iris ENT:  grossly normal hearing, lips & tongue, mmm Neck:  no LAD, masses or thyromegaly Cardiovascular:  RRR, no m/r/g. No LE edema.  Respiratory:   Scattered rhonchi.  Mildly increased respiratory effort. Abdomen:  soft, NT, ND Skin:  L>R stasis dermatitis with weeping of the lateral L lower leg   Musculoskeletal:  grossly normal tone BUE/BLE, good ROM, no bony abnormality Psychiatric:  blunted mood and affect, speech fluent and appropriate, AOx3 Neurologic:  CN 2-12 grossly intact, moves all extremities in coordinated fashion   Radiological Exams on Admission: Independently reviewed - see discussion in A/P where applicable  DG Chest 2 View Result Date: 09/29/2023 CLINICAL DATA:  77 year old female with history of shortness of breath. EXAM: CHEST - 2 VIEW COMPARISON:  Chest x-ray 10/11/2022. FINDINGS: Lung volumes are normal. Diffuse interstitial prominence and widespread peribronchial cuffing with some patchy ill-defined opacities scattered throughout the lungs bilaterally. Small bilateral pleural effusions. No pneumothorax. Pulmonary vasculature is largely obscured. Heart size appears borderline enlarged. The patient  is rotated to the left on today's exam, resulting in distortion of the mediastinal contours and  reduced diagnostic sensitivity and specificity for mediastinal pathology. Atherosclerotic calcifications are noted in the thoracic aorta. IMPRESSION: 1. The appearance the chest suggests severe bronchitis likely with developing multilobar bilateral bronchopneumonia. 2. Small bilateral pleural effusions. 3. Aortic atherosclerosis. Electronically Signed   By: Trudie Reed M.D.   On: 09/29/2023 06:55    EKG: Independently reviewed.  Sinus tachycardia with rate 112; nonspecific ST changes with no evidence of acute ischemia   Labs on Admission: I have personally reviewed the available labs and imaging studies at the time of the admission.  Pertinent labs:    Glucose 178 BUN 16/Creatinine 1.10/GFR 52, improved BNP 507 Hs troponin 14 WBC 11.9 Lactate 1.2 COVID pending   Assessment and Plan:    Multifocal PNA Patient presenting with productive cough, mildly decreased oxygen saturation, and multifocal infiltrates on chest x-ray This appears to be most likely community-acquired pneumonia.  Sepsis ruled out. COVID-19 negative. Strep pneumo pending Blood and sputum cultures pending Will order lower respiratory tract procalcitonin level.   >0.5 indicates infection and >>0.5 indicates more serious disease.  As the procalcitonin level normalizes, it will be reasonable to consider de-escalation of antibiotic coverage.  The sensitivity of procalcitonin is variable and should not be used alone to guide treatment. CURB-65 score is 1 - will admit the patient to telemetry Pneumonia Severity Index (PSI) is Class 2, 1% mortality. Will start Azithromycin 500 mg IV daily and Rocephin due to no risk factors for MDR cause x 5 days Additional complicating factors include: hypoxia LR @ 75cc/hr Fever control Repeat CBC in am Will add albuterol PRN Will add Mucinex for cough  Stasis dermatitis Wound care  consulted  CAD Continue Plavix, Imdur  Chronic diastolic CHF With multifocal infiltrates, CHF could be considered Appears to be compensated at this time Continue spironolactone  COPD No obvious wheezing Appears to be compensated at this time  HTN Continue amlodipine, losartan, Toprol XL  Stage 3a CKD Improved from prior Attempt to avoid nephrotoxic agents Recheck BMP in AM  HLD Continue Zetia, pravastatin  H/o breast CA S/p lumpectomy with seed radiation in 2021  Obesity Measure weight today Weight loss should be encouraged Outpatient PCP/bariatric medicine f/u encouraged     Advance Care Planning:   Code Status: Full Code  - Code status was discussed with the patient and/or family at the time of admission.  The patient would want to receive full resuscitative measures at this time.   Consults: Wound care  DVT Prophylaxis: Lovenox  Family Communication: None present; she declined to have me call her husband  Severity of Illness: The appropriate patient status for this patient is INPATIENT. Inpatient status is judged to be reasonable and necessary in order to provide the required intensity of service to ensure the patient's safety. The patient's presenting symptoms, physical exam findings, and initial radiographic and laboratory data in the context of their chronic comorbidities is felt to place them at high risk for further clinical deterioration. Furthermore, it is not anticipated that the patient will be medically stable for discharge from the hospital within 2 midnights of admission.   * I certify that at the point of admission it is my clinical judgment that the patient will require inpatient hospital care spanning beyond 2 midnights from the point of admission due to high intensity of service, high risk for further deterioration and high frequency of surveillance required.*  Author: Jonah Blue, MD 09/29/2023 1:45 PM  For on call review [email or web address].

## 2023-09-29 NOTE — ED Notes (Signed)
Pt placed on 2L of O2. 

## 2023-09-30 DIAGNOSIS — J189 Pneumonia, unspecified organism: Secondary | ICD-10-CM | POA: Diagnosis not present

## 2023-09-30 NOTE — Progress Notes (Signed)
PROGRESS NOTE  Tracy [name]  HQI:[id number] DOB: 1946/02/06 DOA: 09/29/2023 PCP: [place], [name], MD   Brief Narrative: Patient is a 77 year old female with history of breast cancer, coronary artery disease, chronic diastolic CHF, COPD, hypertension, hyperlipidemia, depression/anxiety, OSA who presented with shortness of breath, chest pain, productive cough, congestion.  Also has chronic stasis dermatitis of the left leg with weeping.  On presentation ,she was tachypneic, hypoxic on room air and was put on 2 L of oxygen with improvement.  Chest x-[name] showed developing multilobar pneumonia, small effusions.  Lab work showed mild elevated BNP, mild leukocytosis.  Started on antibiotics.  Assessment & Plan:  Principal Problem:   Multifocal pneumonia Active Problems:   Mixed hyperlipidemia   Essential hypertension   Chronic heart failure with preserved ejection fraction (HCC)   Obesity   COPD (chronic obstructive pulmonary disease) (HCC)   CKD (chronic kidney disease), stage III (HCC)   Venous stasis dermatitis of both lower extremities   Multifocal pneumonia: Presented with cough, hypoxia, tachypnea.  Chest x-[name] showed multifocal infiltrates.  COVID screen negative.  Currently on azithromycin, ceftriaxone.    Follow-up cultures.  Procalcitonin reassuring  Acute hypoxic respiratory failure: Not on oxygen at home.  Currently on 2 to 3 L of oxygen.  Will gradually wean and stop.  This is secondary to pneumonia.  Status dermatitis/bilateral lower extremity lymphedema: Wound care consulted.  DC IV fluid.  History of coronary artery disease: No anginal symptoms.  On Plavix, Imdur  Chronic diastolic CHF: Mild elevated BNP.  Appears to be euvolemic.  Continue spironolactone  History of COPD: Not on exacerbation at present.  Continue bronchodilators as needed  Hypertension: Monitor blood pressure.  On amlodipine, losartan, Toprol  CKD stage IIIa: Currently kidney function at  baseline  Hyperlipidemia: On Zetia, pravastatin  History of breast cancer: Status post lumpectomy with seed radiation in 2021  Obesity: Weight of 117  Debility/deconditioning: Will consult PT        DVT prophylaxis:enoxaparin (LOVENOX) injection 40 mg Start: 09/29/23 1200     Code Status: Full Code  Family Communication: None at bedside.  Called spouse on phone twice, calls not received  Patient status:Inpatient  Patient is from :home  Anticipated discharge XL:KGMW  Estimated DC date:1-2 days   Consultants: None  Procedures:None  Antimicrobials:  Anti-infectives (From admission, onward)    Start     Dose/Rate Route Frequency Ordered Stop   09/29/23 2200  doxycycline (VIBRAMYCIN) 100 mg in dextrose 5 % 250 mL IVPB        100 mg 125 mL/hr over 120 Minutes Intravenous Every 12 hours 09/29/23 1110     09/29/23 0730  cefTRIAXone (ROCEPHIN) 2 g in sodium chloride 0.9 % 100 mL IVPB        2 g 200 mL/hr over 30 Minutes Intravenous Every 24 hours 09/29/23 0715 10/04/23 0714   09/29/23 0730  doxycycline (VIBRA-TABS) tablet 100 mg        100 mg Oral  Once 09/29/23 0715 09/29/23 0747       Subjective: Patient seen and examined at bedside today.  Hemodynamically stable.  On 2 to 3 L of oxygen per minute.  Denies any worsening shortness of breath or cough.  Speaking in full sentences.  Objective: Vitals:   09/29/23 1201 09/29/23 1642 09/29/23 2055 09/30/23 0456  BP: (!) 183/96 129/66 135/73 (!) 143/93  Pulse: (!) 103 93 89 92  Resp: 18 18 20 18   Temp: (!) 97.5 F (36.4 C)  98 F (36.7 C) 97.8 F (36.6 C) 97.9 F (36.6 C)  TempSrc: Oral  Oral   SpO2: 95% 99% 95% 99%    Intake/Output Summary (Last 24 hours) at 09/30/2023 1610 Last data filed at 09/30/2023 0301 Gross per 24 hour  Intake 1247.78 ml  Output 300 ml  Net 947.78 ml   There were no vitals filed for this visit.  Examination:  General exam: Overall comfortable, not in distress,obese HEENT:  PERRL Respiratory system: Diminished air sounds bilaterally, no wheezes or crackles  Cardiovascular system: S1 & S2 heard, RRR.  Gastrointestinal system: Abdomen is nondistended, soft and nontender. Central nervous system: Alert and oriented Extremities: Bilateral lower extremity edema, no clubbing ,no cyanosis Skin: No rashes, no ulcers,no icterus     Data Reviewed: I have personally reviewed following labs and imaging studies  CBC: Recent Labs  Lab 09/29/23 0639  WBC 11.9*  HGB 13.1  HCT 43.1  MCV 87.1  PLT 273   Basic Metabolic Panel: Recent Labs  Lab 09/29/23 0639  NA 141  K 3.9  CL 108  CO2 22  GLUCOSE 178*  BUN 16  CREATININE 1.10*  CALCIUM 9.1     Recent Results (from the past 240 hours)  SARS Coronavirus 2 by RT PCR (hospital order, performed in [facility] hospital lab) *cepheid single result test* Anterior Nasal Swab     Status: None   Collection Time: 09/29/23  7:21 AM   Specimen: Anterior Nasal Swab  Result Value Ref Range Status   SARS Coronavirus 2 by RT PCR NEGATIVE NEGATIVE Final    Comment: Performed at [facility], [address] [place], [place] [address]     Radiology Studies: DG Chest 2 View Result Date: 09/29/2023 CLINICAL DATA:  77 year old female with history of shortness of breath. EXAM: CHEST - 2 VIEW COMPARISON:  Chest x-[name] 10/11/2022. FINDINGS: Lung volumes are normal. Diffuse interstitial prominence and widespread peribronchial cuffing with some patchy ill-defined opacities scattered throughout the lungs bilaterally. Small bilateral pleural effusions. No pneumothorax. Pulmonary vasculature is largely obscured. Heart size appears borderline enlarged. The patient is rotated to the left on today's exam, resulting in distortion of the mediastinal contours and reduced diagnostic sensitivity and specificity for mediastinal pathology. Atherosclerotic calcifications are noted in the thoracic aorta. IMPRESSION: 1. The appearance the chest  suggests severe bronchitis likely with developing multilobar bilateral bronchopneumonia. 2. Small bilateral pleural effusions. 3. Aortic atherosclerosis. Electronically Signed   By: Trudie Reed M.D.   On: 09/29/2023 06:55    Scheduled Meds:  allopurinol  100 mg Oral Daily   amLODipine  5 mg Oral Daily   clopidogrel  75 mg Oral Daily   docusate sodium  100 mg Oral BID   enoxaparin (LOVENOX) injection  40 mg Subcutaneous Q24H   ezetimibe  10 mg Oral Daily   isosorbide mononitrate  30 mg Oral Daily   losartan  100 mg Oral Daily   metoprolol succinate  100 mg Oral Daily   pantoprazole  40 mg Oral Daily   pravastatin  40 mg Oral q1800   sodium chloride flush  3 mL Intravenous Q12H   spironolactone  12.5 mg Oral Daily   Continuous Infusions:  sodium chloride 75 mL/hr at 09/30/23 0016   cefTRIAXone (ROCEPHIN)  IV 2 g (09/30/23 0744)   doxycycline (VIBRAMYCIN) IV 100 mg (09/29/23 2154)     LOS: 1 day   Ardie Pop, MD [facility] P12/14/2024, 8:08 AM

## 2023-10-01 DIAGNOSIS — J189 Pneumonia, unspecified organism: Secondary | ICD-10-CM | POA: Diagnosis not present

## 2023-10-01 LAB — RESPIRATORY PANEL BY PCR

## 2023-10-01 LAB — CBC
HCT: 35.4 % — ABNORMAL LOW (ref 36.0–46.0)
Hemoglobin: 11.1 g/dL — ABNORMAL LOW (ref 12.0–15.0)
MCH: 26.4 pg (ref 26.0–34.0)
MCHC: 31.4 g/dL (ref 30.0–36.0)
MCV: 84.3 fL (ref 80.0–100.0)
Platelets: 227 10*3/uL (ref 150–400)
RBC: 4.2 MIL/uL (ref 3.87–5.11)
RDW: 15.7 % — ABNORMAL HIGH (ref 11.5–15.5)
WBC: 11.3 10*3/uL — ABNORMAL HIGH (ref 4.0–10.5)
nRBC: 0 % (ref 0.0–0.2)

## 2023-10-01 LAB — BASIC METABOLIC PANEL
Anion gap: 9 (ref 5–15)
BUN: 13 mg/dL (ref 8–23)
CO2: 21 mmol/L — ABNORMAL LOW (ref 22–32)
Calcium: 8.9 mg/dL (ref 8.9–10.3)
Chloride: 109 mmol/L (ref 98–111)
Creatinine, Ser: 1.29 mg/dL — ABNORMAL HIGH (ref 0.44–1.00)
GFR, Estimated: 43 mL/min — ABNORMAL LOW (ref >60)
Glucose, Bld: 124 mg/dL — ABNORMAL HIGH (ref 70–99)
Potassium: 3.4 mmol/L — ABNORMAL LOW (ref 3.5–5.1)
Sodium: 139 mmol/L (ref 135–145)

## 2023-10-01 MED ORDER — POTASSIUM CHLORIDE CRYS ER 20 MEQ PO TBCR
20.0000 meq | EXTENDED_RELEASE_TABLET | Freq: Once | ORAL | Status: AC
  Administered 2023-10-01: 20 meq via ORAL
  Filled 2023-10-01: qty 1

## 2023-10-01 NOTE — Evaluation (Signed)
Physical Therapy Evaluation Patient Details Name: DEMETRIUS THEILEN MRN: [id number] DOB: 1945/11/05 Today's Date: 10/01/2023  History of Present Illness  Pt is a 77 y.o. female admitted 12/13 for multifocal PNA. PMH:  breast cancer, CAD, chronic diastolic CHF, COPD, HTN, hyperlipidemia, depression/anxiety, OSA   Clinical Impression  Pt admitted with above diagnosis. PTA pt lived at home with her husband, mod I household mobility with rollator. No home O2 at baseline. Pt currently with functional limitations due to the deficits listed below (see PT Problem List). On eval, she required supervision bed mobility, CGA transfers, and CGA amb 5' with RW. SpO2 91% on 3L. HR 105. Pt will benefit from acute skilled PT to increase their independence and safety with mobility to allow discharge. Recommend return to OPPT upon d/c. PT to further assess possible need for home O2.          If plan is discharge home, recommend the following: Assistance with cooking/housework;Assist for transportation;A little help with bathing/dressing/bathroom;Help with stairs or ramp for entrance   Can travel by private vehicle        Equipment Recommendations Other (comment) (further assess for home O2)  Recommendations for Other Services       Functional Status Assessment Patient has had a recent decline in their functional status and demonstrates the ability to make significant improvements in function in a reasonable and predictable amount of time.     Precautions / Restrictions Precautions Precautions: Fall;Other (comment) Precaution Comments: watch sats      Mobility  Bed Mobility Overal bed mobility: Needs Assistance Bed Mobility: Supine to Sit     Supine to sit: Supervision, Used rails, HOB elevated          Transfers Overall transfer level: Needs assistance Equipment used: Ambulation equipment used Transfers: Sit to/from Stand Sit to Stand: Contact guard assist                 Ambulation/Gait Ambulation/Gait assistance: Contact guard assist Gait Distance (Feet): 5 Feet Assistive device: Rolling walker (2 wheels) Gait Pattern/deviations: Step-through pattern, Decreased stride length       General Gait Details: SpO2 91% on 3L  Stairs            Wheelchair Mobility     Tilt Bed    Modified Rankin (Stroke Patients Only)       Balance Overall balance assessment: Needs assistance Sitting-balance support: No upper extremity supported, Feet supported Sitting balance-Leahy Scale: Fair     Standing balance support: Bilateral upper extremity supported, Reliant on assistive device for balance, During functional activity Standing balance-Leahy Scale: Poor                               Pertinent Vitals/Pain Pain Assessment Pain Assessment: No/denies pain    Home Living Family/patient expects to be discharged to:: Private residence Living Arrangements: Spouse/significant other Available Help at Discharge: Family;Available 24 hours/day Type of Home: House Home Access: Stairs to enter Entrance Stairs-Rails: None Entrance Stairs-Number of Steps: 6 Alternate Level Stairs-Number of Steps: flight Home Layout: Two level;Bed/bath upstairs;1/2 bath on main level Home Equipment: Rollator (4 wheels) Additional Comments: Pt has been living on main level and sponge bathing. No home O2 at baseline.    Prior Function Prior Level of Function : Needs assist             Mobility Comments: amb household distances with rollator. Husband assists on entry stairs. Pt  active with OPPT at time of admission. ADLs Comments: Husband drives and performs iADLs.     Extremity/Trunk Assessment   Upper Extremity Assessment Upper Extremity Assessment: Overall WFL for tasks assessed    Lower Extremity Assessment Lower Extremity Assessment: Generalized weakness    Cervical / Trunk Assessment Cervical / Trunk Assessment: Normal  Communication    Communication Communication: No apparent difficulties  Cognition Arousal: Alert Behavior During Therapy: WFL for tasks assessed/performed Overall Cognitive Status: Within Functional Limits for tasks assessed                                          General Comments General comments (skin integrity, edema, etc.): SpO2 91% on 3L during activity. HR 105    Exercises     Assessment/Plan    PT Assessment Patient needs continued PT services  PT Problem List Decreased strength;Decreased balance;Decreased mobility;Cardiopulmonary status limiting activity;Decreased activity tolerance       PT Treatment Interventions Functional mobility training;Balance training;Patient/family education;Gait training;Therapeutic activities;Stair training;Therapeutic exercise    PT Goals (Current goals can be found in the Care Plan section)  Acute Rehab PT Goals Patient Stated Goal: home PT Goal Formulation: With patient Time For Goal Achievement: 10/15/23 Potential to Achieve Goals: Good    Frequency Min 1X/week     Co-evaluation               AM-PAC PT "6 Clicks" Mobility  Outcome Measure Help needed turning from your back to your side while in a flat bed without using bedrails?: A Little Help needed moving from lying on your back to sitting on the side of a flat bed without using bedrails?: A Little Help needed moving to and from a bed to a chair (including a wheelchair)?: A Little Help needed standing up from a chair using your arms (e.g., wheelchair or bedside chair)?: A Little Help needed to walk in hospital room?: A Little Help needed climbing 3-5 steps with a railing? : A Lot 6 Click Score: 17    End of Session Equipment Utilized During Treatment: Gait belt;Oxygen Activity Tolerance: Patient tolerated treatment well Patient left: in chair;with call bell/phone within reach Nurse Communication: Mobility status PT Visit Diagnosis: Other abnormalities of gait and  mobility (R26.89);Muscle weakness (generalized) (M62.81)    Time: 9528-4132 PT Time Calculation (min) (ACUTE ONLY): 16 min   Charges:   PT Evaluation $PT Eval Moderate Complexity: 1 Mod   PT General Charges $$ ACUTE PT VISIT: 1 Visit         Ferd Glassing., PT  Office # [phone]   Ilda Foil 10/01/2023, 10:34 AM

## 2023-10-01 NOTE — Progress Notes (Signed)
[facility] PROGRESS NOTE  Tracy [name] (DOB: November 05, 1945) YQM:[id number] PCP: [place], [name], MD  Brief Narrative: Tracy [name] is a 77 y.o. female with a history of CAD, chronic HFpEF, chronic LLE edema, COPD, HTN, HLD, depression/anxiety, OSA, and breast CA who presented to the ED on 09/29/2023 with productive cough, congestion, chest pain and dyspnea. She was tachypneic and hypoxic with 2 view CXR demonstrating multifocal patchy opacities and bronchitic changes with small pleural effusions. WBC mildly elevated as was BNP. Antibiotics were given and the patient was admitted for acute hypoxic respiratory failure due to multifocal pneumonia.   Subjective: Breathing better, cough decreased but remains and she's short of breath worse with any exertion, still low oxygen while walking with PT today. No chest pain.   Objective: BP 138/72 (BP Location: Right Arm)   Pulse 99   Temp 98.2 F (36.8 C) (Oral)   Resp 20   Wt 99.6 kg   SpO2 94%   BMI 37.69 kg/m   Gen: No distress, resting quietly Pulm: Diminished, no wheezing, no crackles. Nonlabored but tachypneic even at rest.  CV: RRR, no MRG, +LE edema which pt reports is improved from prior baseline. GI: Soft, NT, ND, +BS  Neuro: Alert and oriented. No new focal deficits. Ext: Warm, no deformities. Skin: LLE w/ACE wrap not taken down. It is c/d/i  Assessment & Plan: Multifocal pneumonia: Presented with cough, hypoxia, tachypnea. Some antecedent viral URI symptoms. PCT undetectable, Chest x-ray showed multifocal infiltrates. Strep Ag negative.  - Check RVP. COVID screen negative. Empiric droplet precautions. - Continue ceftriaxone and doxycycline pending culture data (blood cultures 12/13 NGTD; sputum Cx pending). Note she did present with leukocytosis. If continues improvement, would not extend duration beyond 5 days. - Continue mucolytic   Acute hypoxic respiratory failure, COPD without exacerbation:  - Continues to  require supplemental oxygen to maintain oxygenation. Will attempt to wean and check ambulatory pulse oximetry regularly. Anticipate discharging without oxygen.  - Given bronchitic changes on CXR and her COPD, consideration to steroids is given, though she has no wheezing. If unable to liberate from oxygen, consider trial of steroids and/or diuretic.    Chronic HFpEF, HTN: BNP elevated to 507, given IVF in ED.  - Continue home meds including norvasc, losartan, metoprolol, isosorbide, spironolactone.  - Not on a standing diuretic at baseline.   Status dermatitis/bilateral lower extremity lymphedema:  - Wound care consulted. Consider trial of diuresis, though pt reports swelling is actually better than her baseline. No crackles on exam.    CAD, HLD: No angina. Troponin wnl.  - Continue plavix, BB, nitrate, statin, zetia     CKD stage IIIa: Stable.    History of breast cancer: Status post lumpectomy with seed radiation in 2021   Obesity: Body mass index is 37.69 kg/m.    Debility/deconditioning: - Appreciate PT efforts.  GERD:  - PPI  Hypokalemia: - Supplement and monitor  Tyrone Nine, MD [facility] [email or web address] 10/01/2023, 1:53 PM

## 2023-10-02 DIAGNOSIS — J189 Pneumonia, unspecified organism: Secondary | ICD-10-CM | POA: Diagnosis not present

## 2023-10-02 LAB — BASIC METABOLIC PANEL
Anion gap: 4 — ABNORMAL LOW (ref 5–15)
BUN: 17 mg/dL (ref 8–23)
CO2: 24 mmol/L (ref 22–32)
Calcium: 8.5 mg/dL — ABNORMAL LOW (ref 8.9–10.3)
Chloride: 108 mmol/L (ref 98–111)
Creatinine, Ser: 1.2 mg/dL — ABNORMAL HIGH (ref 0.44–1.00)
GFR, Estimated: 47 mL/min — ABNORMAL LOW (ref >60)
Glucose, Bld: 114 mg/dL — ABNORMAL HIGH (ref 70–99)
Potassium: 4.2 mmol/L (ref 3.5–5.1)
Sodium: 136 mmol/L (ref 135–145)

## 2023-10-02 LAB — CBC
HCT: 32.3 % — ABNORMAL LOW (ref 36.0–46.0)
Hemoglobin: 9.8 g/dL — ABNORMAL LOW (ref 12.0–15.0)
MCH: 26.6 pg (ref 26.0–34.0)
MCHC: 30.3 g/dL (ref 30.0–36.0)
MCV: 87.5 fL (ref 80.0–100.0)
Platelets: 208 10*3/uL (ref 150–400)
RBC: 3.69 MIL/uL — ABNORMAL LOW (ref 3.87–5.11)
RDW: 16 % — ABNORMAL HIGH (ref 11.5–15.5)
WBC: 9.4 10*3/uL (ref 4.0–10.5)
nRBC: 0 % (ref 0.0–0.2)

## 2023-10-02 MED ORDER — FUROSEMIDE 10 MG/ML IJ SOLN
40.0000 mg | Freq: Once | INTRAMUSCULAR | Status: AC
  Administered 2023-10-02: 40 mg via INTRAVENOUS
  Filled 2023-10-02: qty 4

## 2023-10-02 NOTE — Care Management Important Message (Signed)
Important Message  Patient Details  Name: Tracy [name] MRN: [id number] Date of Birth: Mar 15, 1946   Important Message Given:  Yes - Medicare IM     Dorena Bodo 10/02/2023, 3:51 PM

## 2023-10-02 NOTE — Progress Notes (Signed)
Mobility Specialist: Progress Note   10/02/23 1546  Mobility  Activity Ambulated with assistance in hallway  Level of Assistance Contact guard assist, steadying assist  Assistive Device Front wheel walker  Distance Ambulated (ft) 75 ft  Activity Response Tolerated well  Mobility Referral Yes  Mobility visit 1 Mobility  Mobility Specialist Start Time (ACUTE ONLY) 1011  Mobility Specialist Stop Time (ACUTE ONLY) 1030  Mobility Specialist Time Calculation (min) (ACUTE ONLY) 19 min    Pt was agreeable to mobility session - received in bed. ModI for bed mobility, CG for STS and ambulation. C/o SOB and feeling weak. SpO2 maintaining >90% on 3LO2 throughout. Returned to room without fault. Left on EOB with all needs met, call bell in reach.   Maurene Capes Mobility Specialist Please contact via SecureChat or Rehab office at [phone]

## 2023-10-02 NOTE — Progress Notes (Signed)
Physical Therapy Treatment Patient Details Name: Tracy [name] MRN: [id number] DOB: Nov 20, 1945 Today's Date: 10/02/2023   History of Present Illness Pt is a 77 y.o. female admitted 12/13 for multifocal PNA. PMH:  breast cancer, CAD, chronic diastolic CHF, COPD, HTN, hyperlipidemia, depression/anxiety, OSA    PT Comments  Continuing work on functional mobility and activity tolerance;  Session focused on progressive amb with close monitoring of O2 sats; Much improved gait distance compared to previous PT session; Still, O2 sats decreased (see other PT note from 12/16); At this point, we must consider supplemental O2 for home; Will plan to check O2 sats with amb on room air again; will also consider using her single point cane vs a rollator with amb; while I hesitate to switch to a device that gives more support, a rollator can be helpful for walking with supplemental O2   If plan is discharge home, recommend the following: Assistance with cooking/housework;Assist for transportation;A little help with bathing/dressing/bathroom;Help with stairs or ramp for entrance   Can travel by private vehicle        Equipment Recommendations  Other (comment) (further assess for home O2)    Recommendations for Other Services       Precautions / Restrictions Precautions Precautions: Fall;Other (comment) Precaution Comments: watch sats Restrictions Weight Bearing Restrictions Per Provider Order: No     Mobility  Bed Mobility Overal bed mobility: Needs Assistance Bed Mobility: Supine to Sit     Supine to sit: Supervision, Used rails, HOB elevated          Transfers Overall transfer level: Needs assistance Equipment used:  (stood at EOB with a hand on vitals machine) Transfers: Sit to/from Stand Sit to Stand: Contact guard assist                Ambulation/Gait Ambulation/Gait assistance: Contact guard assist Gait Distance (Feet): 110 Feet Assistive device: Straight cane  (adn with one hand on vitals machine) Gait Pattern/deviations: Step-through pattern, Decreased stride length Gait velocity: slowed     General Gait Details: Cues to self-monitor for activity tolerance; walked in the hallway, pushing vitals machine; O2 sats decr on room air; see other PT note of this date   Stairs             Wheelchair Mobility     Tilt Bed    Modified Rankin (Stroke Patients Only)       Balance     Sitting balance-Leahy Scale: Fair       Standing balance-Leahy Scale: Poor (approaching Fair)                              Cognition Arousal: Alert Behavior During Therapy: WFL for tasks assessed/performed Overall Cognitive Status: Within Functional Limits for tasks assessed                                          Exercises      General Comments General comments (skin integrity, edema, etc.): Overall good progress compared to last PT session      Pertinent Vitals/Pain Pain Assessment Pain Assessment: No/denies pain    Home Living                          Prior Function  PT Goals (current goals can now be found in the care plan section) Acute Rehab PT Goals Patient Stated Goal: home PT Goal Formulation: With patient Time For Goal Achievement: 10/15/23 Potential to Achieve Goals: Good Progress towards PT goals: Progressing toward goals    Frequency    Min 1X/week      PT Plan      Co-evaluation              AM-PAC PT "6 Clicks" Mobility   Outcome Measure  Help needed turning from your back to your side while in a flat bed without using bedrails?: A Little Help needed moving from lying on your back to sitting on the side of a flat bed without using bedrails?: A Little Help needed moving to and from a bed to a chair (including a wheelchair)?: A Little Help needed standing up from a chair using your arms (e.g., wheelchair or bedside chair)?: A Little Help needed to  walk in hospital room?: A Little Help needed climbing 3-5 steps with a railing? : A Lot 6 Click Score: 17    End of Session Equipment Utilized During Treatment: Gait belt;Oxygen Activity Tolerance: Patient tolerated treatment well Patient left: with call bell/phone within reach (sitting on [facility]) Nurse Communication: Mobility status PT Visit Diagnosis: Other abnormalities of gait and mobility (R26.89);Muscle weakness (generalized) (M62.81)     Time: 1610-9604 PT Time Calculation (min) (ACUTE ONLY): 36 min  Charges:    $Gait Training: 23-37 mins PT General Charges $$ ACUTE PT VISIT: 1 Visit                     Van Clines, PT  Acute Rehabilitation Services Office [phone] Secure Chat welcomed    [name] 10/02/2023, 2:07 PM

## 2023-10-02 NOTE — Progress Notes (Signed)
[facility] PROGRESS NOTE  TYSHIA BEITER (DOB: 1946-03-22) [id number] PCP: [place], [name], MD  Brief Narrative: MALI WALP is a 77 y.o. female with a history of CAD, chronic HFpEF, chronic LLE edema, COPD, HTN, HLD, depression/anxiety, OSA, and breast CA who presented to the ED on 09/29/2023 with productive cough, congestion, chest pain and dyspnea. She was tachypneic and hypoxic with 2 view CXR demonstrating multifocal patchy opacities and bronchitic changes with small pleural effusions. WBC mildly elevated as was BNP. Antibiotics were given and the patient was admitted for acute hypoxic respiratory failure due to multifocal pneumonia.   Subjective: More short of breath last night, better when she's sitting up. No chest pain. Still coughing. No fevers.   Objective: BP 139/78   Pulse 90   Temp 98.4 F (36.9 C)   Resp 18   Wt 99.6 kg   SpO2 94%   BMI 37.69 kg/m   Gen: No distress Pulm: Coughing, wet, crackles at bases, nonlabored with supplemental oxygen CV: RRR, no MRG, stable edema GI: Soft, NT, ND, +BS  Neuro: Alert and oriented. No new focal deficits. Ext: Warm, no deformities. Skin: No new rashes, lesions or ulcers on visualized skin   Assessment & Plan: Multifocal pneumonia: Presented with cough, hypoxia, tachypnea. Some antecedent viral URI symptoms. PCT undetectable, Chest x-ray showed multifocal infiltrates. Strep Ag negative. RVP negative. COVID screen negative.   - Continue ceftriaxone and doxycycline pending culture data (blood cultures 12/13 NGTD; sputum Cx still pending). Note she did present with leukocytosis. If continues improvement, would not extend duration beyond 5 days. - Continue mucolytic   Acute hypoxic respiratory failure, COPD without exacerbation:  - Continues to require supplemental oxygen to maintain oxygenation. Will attempt to wean and check ambulatory pulse oximetry regularly. Anticipate discharging without oxygen.  - Given  bronchitic changes on CXR and her COPD, consideration to steroids is given, though she has no wheezing. - Trial lasix x1.    Acute on chronic HFpEF, HTN: BNP elevated to 507, given IVF in ED.  - Continue home meds including norvasc, losartan, metoprolol, isosorbide, spironolactone.  - Not on a standing diuretic at baseline. Crackles on exam and failure to wean from oxygen thus far, so will trial lasix 40mg  IV x1, monitor I/O, weights, and cardiac telemetry.   Status dermatitis/bilateral lower extremity lymphedema:  - Wound care consulted. Consider trial of diuresis, though pt reports swelling is actually better than her baseline. No crackles on exam.    CAD, HLD: No angina. Troponin wnl.  - Continue plavix, BB, nitrate, statin, zetia     CKD stage IIIa: Stable.    History of breast cancer: Status post lumpectomy with seed radiation in 2021   Obesity: Body mass index is 37.69 kg/m.    Debility/deconditioning: - Appreciate PT efforts.  GERD:  - PPI  Hypokalemia: - Supplement and monitor  Tyrone Nine, MD [facility] [email or web address] 10/02/2023, 9:08 AM

## 2023-10-02 NOTE — Progress Notes (Signed)
Physical Therapy Note  SATURATION QUALIFICATIONS: (This note is used to comply with regulatory documentation for home oxygen)  Patient Saturations on Room Air at Rest = 91%  Patient Saturations on Room Air while Ambulating = 85%  Patient Saturations on 3-4 Liters of oxygen while Ambulating = 93%  Please briefly explain why patient needs home oxygen: Patient requires supplemental oxygen to maintain oxygen saturations at acceptable, safe levels with physical activity.  Van Clines, PT  Acute Rehabilitation Services Office [phone] Secure Chat welcomed   (Full PT treatment note to follow)

## 2023-10-03 ENCOUNTER — Inpatient Hospital Stay (HOSPITAL_COMMUNITY): Payer: Medicare Other

## 2023-10-03 DIAGNOSIS — J189 Pneumonia, unspecified organism: Secondary | ICD-10-CM | POA: Diagnosis not present

## 2023-10-03 LAB — BASIC METABOLIC PANEL
Anion gap: 9 (ref 5–15)
BUN: 25 mg/dL — ABNORMAL HIGH (ref 8–23)
CO2: 24 mmol/L (ref 22–32)
Calcium: 8.7 mg/dL — ABNORMAL LOW (ref 8.9–10.3)
Chloride: 106 mmol/L (ref 98–111)
Creatinine, Ser: 1.29 mg/dL — ABNORMAL HIGH (ref 0.44–1.00)
GFR, Estimated: 43 mL/min — ABNORMAL LOW (ref >60)
Glucose, Bld: 103 mg/dL — ABNORMAL HIGH (ref 70–99)
Potassium: 3.6 mmol/L (ref 3.5–5.1)
Sodium: 139 mmol/L (ref 135–145)

## 2023-10-03 LAB — CBC
HCT: 34.6 % — ABNORMAL LOW (ref 36.0–46.0)
Hemoglobin: 10.7 g/dL — ABNORMAL LOW (ref 12.0–15.0)
MCH: 26.5 pg (ref 26.0–34.0)
MCHC: 30.9 g/dL (ref 30.0–36.0)
MCV: 85.6 fL (ref 80.0–100.0)
Platelets: 229 10*3/uL (ref 150–400)
RBC: 4.04 MIL/uL (ref 3.87–5.11)
RDW: 15.9 % — ABNORMAL HIGH (ref 11.5–15.5)
WBC: 9.2 10*3/uL (ref 4.0–10.5)
nRBC: 0 % (ref 0.0–0.2)

## 2023-10-03 MED ORDER — FUROSEMIDE 10 MG/ML IJ SOLN
40.0000 mg | Freq: Once | INTRAMUSCULAR | Status: DC
  Filled 2023-10-03: qty 4

## 2023-10-03 NOTE — TOC CM/SW Note (Signed)
Transition of Care [facility]) - Inpatient Brief Assessment   Patient Details  Name: Tracy [name] MRN: [id number] Date of Birth: 1946/01/17  [facility] of [facility]) CM/SW Contact:    Tom-Johnson, Hershal Coria, RN Phone Number: 10/03/2023, 5:26 PM   Clinical Narrative:  Patient presented to the ED with SOB, Chest Tightness, Productive Cough with Chest Congestion. Admitted with Multifocal Pneumonia. Patient also noted with BLE Edema, WOC following for LLE Lymphedema ulcer. Currently on IV abx, 3L O2 acute, not on home O2.   From home with husband, has two children and a supportive sister who lives in [place]. Retired, does not drive, husband transports to and from appointments.  Has a cane, walker and rollator at home. PCP is [place], [name], MD and uses [facility] on [address].   Patient requested [facility] on [address] for Outpatient PT, order and info on AVS.  Home O2 ordered from [facility] to deliver to patient at bedside.  Patient not Medically ready for discharge.  CM will continue to follow as patient progresses with care towards discharge.          Transition of Care Asessment: Insurance and Status: Insurance coverage has been reviewed Patient has primary care physician: Yes Home environment has been reviewed: Yes Prior level of function:: Modified Independent Prior/Current Home Services: No current home services Social Drivers of Health Review: SDOH reviewed no interventions necessary Readmission risk has been reviewed: Yes Transition of care needs: transition of care needs identified, TOC will continue to follow

## 2023-10-03 NOTE — Progress Notes (Signed)
Physical Therapy Note  (Full PT note to follow)  SATURATION QUALIFICATIONS: (This note is used to comply with regulatory documentation for home oxygen)  Patient Saturations on Room Air at Rest = 94%  Patient Saturations on Room Air while Ambulating = 82%  Patient Saturations on 3-4 Liters of oxygen while Ambulating = 94%  Please briefly explain why patient needs home oxygen: Patient requires supplemental oxygen to maintain oxygen saturations at acceptable, safe levels with physical activity.  Van Clines, PT  Acute Rehabilitation Services Office [phone] Secure Chat welcomed

## 2023-10-03 NOTE — Progress Notes (Signed)
Physical Therapy Treatment Patient Details Name: Tracy [name] MRN: [id number] DOB: 05-25-1946 Today's Date: 10/03/2023   History of Present Illness Pt is a 77 y.o. female admitted 12/13 for multifocal PNA. PMH:  breast cancer, CAD, chronic diastolic CHF, COPD, HTN, hyperlipidemia, depression/anxiety, OSA    PT Comments  Continuing work on functional mobility and activity tolerance;  Session focused on progressive amb; Used SPC and rollator this session, and pt benefitted form bil UE support given by rollator; tells me she has a rollator at home    If plan is discharge home, recommend the following: Assistance with cooking/housework;Assist for transportation;A little help with bathing/dressing/bathroom;Help with stairs or ramp for entrance   Can travel by private vehicle        Equipment Recommendations  Other (comment) (further assess for home O2)    Recommendations for Other Services       Precautions / Restrictions Precautions Precautions: Fall;Other (comment) Precaution Comments: watch sats Restrictions Weight Bearing Restrictions Per Provider Order: No     Mobility  Bed Mobility Overal bed mobility: Needs Assistance Bed Mobility: Supine to Sit     Supine to sit: Supervision, Used rails, HOB elevated          Transfers Overall transfer level: Needs assistance Equipment used:  (stood at EOB with a hand on vitals machine) Transfers: Sit to/from Stand Sit to Stand: Contact guard assist           General transfer comment: Smooth rise from EOB slightly elevated    Ambulation/Gait Ambulation/Gait assistance: Contact guard assist Gait Distance (Feet): 130 Feet Assistive device: Straight cane, Rollator (4 wheels) (adn with one hand on vitals machine) Gait Pattern/deviations: Step-through pattern, Decreased stride length Gait velocity: slowed     General Gait Details: Cues to self-monitor for activity tolerance; walked in the hallway, started with  single point cane, adn tehn opted for rollator for bil UE support; one seated rest break, gait clearly slower returning to the room; O2 sats decr on room air; see other PT note of this date   Stairs             Wheelchair Mobility     Tilt Bed    Modified Rankin (Stroke Patients Only)       Balance     Sitting balance-Leahy Scale: Fair       Standing balance-Leahy Scale: Poor (approaching Fair)                              Cognition Arousal: Alert Behavior During Therapy: WFL for tasks assessed/performed Overall Cognitive Status: Within Functional Limits for tasks assessed                                          Exercises      General Comments General comments (skin integrity, edema, etc.): See other PT note of this date      Pertinent Vitals/Pain      Home Living                          Prior Function            PT Goals (current goals can now be found in the care plan section) Acute Rehab PT Goals Patient Stated Goal: home PT Goal Formulation: With patient Time For  Goal Achievement: 10/15/23 Potential to Achieve Goals: Good Progress towards PT goals: Progressing toward goals    Frequency    Min 1X/week      PT Plan      Co-evaluation              AM-PAC PT "6 Clicks" Mobility   Outcome Measure  Help needed turning from your back to your side while in a flat bed without using bedrails?: A Little Help needed moving from lying on your back to sitting on the side of a flat bed without using bedrails?: A Little Help needed moving to and from a bed to a chair (including a wheelchair)?: A Little Help needed standing up from a chair using your arms (e.g., wheelchair or bedside chair)?: A Little Help needed to walk in hospital room?: A Little Help needed climbing 3-5 steps with a railing? : A Lot 6 Click Score: 17    End of Session Equipment Utilized During Treatment: Gait  belt;Oxygen Activity Tolerance: Patient tolerated treatment well Patient left: with call bell/phone within reach (sitting on [facility]) Nurse Communication: Mobility status PT Visit Diagnosis: Other abnormalities of gait and mobility (R26.89);Muscle weakness (generalized) (M62.81)     Time: 4098-1191 PT Time Calculation (min) (ACUTE ONLY): 29 min  Charges:    $Gait Training: 23-37 mins PT General Charges $$ ACUTE PT VISIT: 1 Visit                     Tracy [name], PT  Acute Rehabilitation Services Office [phone] Secure Chat welcomed    Tracy [name] 10/03/2023, 2:13 PM

## 2023-10-03 NOTE — Progress Notes (Signed)
[facility] PROGRESS NOTE  Tracy [name] (DOB: Apr 21, 1946) QIO:[id number] PCP: [place], [name], MD  Brief Narrative: Tracy [name] is a 77 y.o. female with a history of CAD, chronic HFpEF, chronic LLE edema, COPD, HTN, HLD, depression/anxiety, OSA, and breast CA who presented to the ED on 09/29/2023 with productive cough, congestion, chest pain and dyspnea. She was tachypneic and hypoxic with 2 view CXR demonstrating multifocal patchy opacities and bronchitic changes with small pleural effusions. WBC mildly elevated as was BNP. Antibiotics were given and the patient was admitted for acute hypoxic respiratory failure due to multifocal pneumonia.   Subjective: Shortness of breath slowly improving, no longer hypoxemic at rest, but severely dyspneic when getting up which she had to do a lot with urination from lasix yesterday. No wheezing.  Objective: BP (!) 160/81 (BP Location: Right Arm)   Pulse 99   Temp 97.6 F (36.4 C)   Resp 20   Wt 99.6 kg   SpO2 96%   BMI 37.69 kg/m   Gen: No distress Pulm: Crackles, diminished at bases, no wheezes, nonlabored  CV: RRR, no MRG, stable edema GI: Soft, NT, ND, +BS  Neuro: Alert and oriented. No new focal deficits. Ext: Warm, no deformities. Skin: No rashes, lesions or ulcers on visualized skin   Assessment & Plan: Multifocal pneumonia: Presented with cough, hypoxia, tachypnea. Some antecedent viral URI symptoms. PCT undetectable, Chest x-ray showed multifocal infiltrates. Strep Ag negative. RVP negative. COVID screen negative.   - Continue ceftriaxone and doxycycline pending culture data (blood cultures 12/13 remain NGTD; sputum Cx still pending). Note she did present with leukocytosis. Complete course tmrw. - Continue mucolytic   Acute hypoxic respiratory failure, COPD without exacerbation:  - Continues to require supplemental oxygen though only with exertion. She is improving, albeit slowly. Repeat CXR today shows slight  interval improvement. We will continue treatments and repeat ambulatory pulse oximetry in AM, planning DC with or without exertional O2. - Given bronchitic changes on CXR and her COPD, consideration to steroids is given, though she has no wheezing.   Acute on chronic HFpEF, HTN: BNP elevated to 507, given IVF in ED.  - Continue home meds including norvasc, losartan, metoprolol, isosorbide, spironolactone.  - Not on a standing diuretic at baseline. Slight crackles and interstitial prominence may be edema on CXR. Given lasix 12/16, will repeat today.    Status dermatitis/bilateral lower extremity lymphedema:  - Wound care consulted.     CAD, HLD: No angina. Troponin wnl.  - Continue plavix, BB, nitrate, statin, zetia     CKD stage IIIa: Stable.    History of breast cancer: Status post lumpectomy with seed radiation in 2021   Obesity: Body mass index is 37.69 kg/m.    Debility/deconditioning: - Appreciate PT efforts. Will plan for outpatient PT after discharge.  GERD:  - PPI  Hypokalemia: - Supplement and monitor  Tyrone Nine, MD [facility] [email or web address] 10/03/2023, 3:39 PM

## 2023-10-03 NOTE — Plan of Care (Signed)
  Problem: Activity: Goal: Ability to tolerate increased activity will improve Outcome: Progressing   Problem: Clinical Measurements: Goal: Ability to maintain a body temperature in the normal range will improve Outcome: Progressing   Problem: Respiratory: Goal: Ability to maintain adequate ventilation will improve Outcome: Progressing Goal: Ability to maintain a clear airway will improve Outcome: Progressing   Problem: Education: Goal: Knowledge of General Education information will improve Description: Including pain rating scale, medication(s)/side effects and non-pharmacologic comfort measures Outcome: Progressing   Problem: Health Behavior/Discharge Planning: Goal: Ability to manage health-related needs will improve Outcome: Progressing   Problem: Clinical Measurements: Goal: Ability to maintain clinical measurements within normal limits will improve Outcome: Progressing Goal: Will remain free from infection Outcome: Progressing Goal: Diagnostic test results will improve Outcome: Progressing Goal: Respiratory complications will improve Outcome: Progressing Goal: Cardiovascular complication will be avoided Outcome: Progressing   Problem: Activity: Goal: Risk for activity intolerance will decrease Outcome: Progressing   Problem: Nutrition: Goal: Adequate nutrition will be maintained Outcome: Progressing   Problem: Coping: Goal: Level of anxiety will decrease Outcome: Progressing   Problem: Elimination: Goal: Will not experience complications related to bowel motility Outcome: Progressing Goal: Will not experience complications related to urinary retention Outcome: Progressing   Problem: Pain Management: Goal: General experience of comfort will improve Outcome: Progressing   Problem: Safety: Goal: Ability to remain free from injury will improve Outcome: Progressing   Problem: Skin Integrity: Goal: Risk for impaired skin integrity will decrease Outcome:  Progressing

## 2023-10-04 ENCOUNTER — Ambulatory Visit: Payer: Medicare Other

## 2023-10-04 DIAGNOSIS — J189 Pneumonia, unspecified organism: Secondary | ICD-10-CM | POA: Diagnosis not present

## 2023-10-04 LAB — BASIC METABOLIC PANEL
Anion gap: 8 (ref 5–15)
BUN: 27 mg/dL — ABNORMAL HIGH (ref 8–23)
CO2: 25 mmol/L (ref 22–32)
Calcium: 8.4 mg/dL — ABNORMAL LOW (ref 8.9–10.3)
Chloride: 107 mmol/L (ref 98–111)
Creatinine, Ser: 1.33 mg/dL — ABNORMAL HIGH (ref 0.44–1.00)
GFR, Estimated: 42 mL/min — ABNORMAL LOW (ref >60)
Glucose, Bld: 108 mg/dL — ABNORMAL HIGH (ref 70–99)
Potassium: 3.7 mmol/L (ref 3.5–5.1)
Sodium: 140 mmol/L (ref 135–145)

## 2023-10-04 LAB — CULTURE, BLOOD (ROUTINE X 2)
Culture: NO GROWTH
Culture: NO GROWTH
Special Requests: ADEQUATE

## 2023-10-04 MED ORDER — FUROSEMIDE 10 MG/ML IJ SOLN
40.0000 mg | Freq: Once | INTRAMUSCULAR | Status: AC
  Administered 2023-10-04: 40 mg via INTRAVENOUS
  Filled 2023-10-04: qty 4

## 2023-10-04 NOTE — Progress Notes (Signed)
[facility] PROGRESS NOTE  Tracy [name] (DOB: 12/25/1945) ZOX:[id number] PCP: [place], [name], MD  Brief Narrative: Tracy [name] is a 77 y.o. female with a history of CAD, chronic HFpEF, chronic LLE edema, COPD, HTN, HLD, depression/anxiety, OSA, and breast CA who presented to the ED on 09/29/2023 with productive cough, congestion, chest pain and dyspnea. She was tachypneic and hypoxic with 2 view CXR demonstrating multifocal patchy opacities and bronchitic changes with small pleural effusions. WBC mildly elevated as was BNP. Antibiotics were given and the patient was admitted for acute hypoxic respiratory failure due to multifocal pneumonia.   Subjective: Still with some lingering cough that is worse in the mornings. Didn't want to use purewick last night so declined the lasix. Shortness of breath with any exertion remains significant which was not present at her baseline. She would prefer to discharge without oxygen if possible.  Objective: BP 136/71 (BP Location: Right Arm)   Pulse 97   Temp 98.3 F (36.8 C) (Oral)   Resp 18   Wt 99.6 kg   SpO2 98%   BMI 37.69 kg/m   Gen: No distress Pulm: Bibasilar crackles and diminished breath sounds throughout (early this morning), nonlabored  CV: RRR, stable edema GI: Soft, NT, ND, +BS  Neuro: Alert and oriented. No new focal deficits. Ext: Warm, no deformities. Skin: No rashes, lesions or ulcers on visualized skin   Assessment & Plan: Multifocal pneumonia: Presented with cough, hypoxia, tachypnea. Some antecedent viral URI symptoms. PCT undetectable, Chest x-ray showed multifocal infiltrates. Strep Ag negative. RVP negative. COVID screen negative.   - Continue ceftriaxone and doxycycline pending culture data (blood cultures 12/13 remain NGTD; sputum Cx still pending). Note she did present with leukocytosis. Complete course tmrw. - Continue mucolytic   Acute hypoxic respiratory failure, COPD without exacerbation:  -  Continues to require supplemental oxygen though only with exertion. She is improving, albeit slowly. Repeat CXR today shows slight interval improvement. We will continue treatments and repeat ambulatory pulse oximetry in AM, planning DC with or without exertional O2. - Given bronchitic changes on CXR and her COPD, consideration to steroids is given, though she has no wheezing.   Acute on chronic HFpEF, HTN: BNP elevated to 507, given IVF in ED.  - Continue home meds including norvasc, losartan, metoprolol, isosorbide, spironolactone.  - Not on a standing diuretic at baseline. Slight dependent crackles remain and interstitial prominence may be edema on CXR. Given lasix 12/16 and she showed some measurable improvement, hoped to repeat yesterday but pt refused. She is amenable to taking lasix today in an effort of discharging home tomorrow without oxygen. She's aware that she may still need exertional oxygen at home for a short time regardless.   Status dermatitis/bilateral lower extremity lymphedema:  - Wound care consulted.     CAD, HLD: No angina. Troponin wnl.  - Continue plavix, BB, nitrate, statin, zetia     CKD stage IIIa: Stable.    History of breast cancer: Status post lumpectomy with seed radiation in 2021   Obesity: Body mass index is 37.69 kg/m.    Debility/deconditioning: - Appreciate PT efforts. Will plan for outpatient PT after discharge.  GERD:  - PPI  Hypokalemia: - Supplement and monitor  Tyrone Nine, MD [email or web address] 10/04/2023, 7:40 AM

## 2023-10-04 NOTE — Plan of Care (Signed)
  Problem: Activity: Goal: Ability to tolerate increased activity will improve Outcome: Progressing   Problem: Clinical Measurements: Goal: Ability to maintain a body temperature in the normal range will improve Outcome: Progressing   Problem: Respiratory: Goal: Ability to maintain adequate ventilation will improve Outcome: Progressing Goal: Ability to maintain a clear airway will improve Outcome: Progressing   Problem: Education: Goal: Knowledge of General Education information will improve Description: Including pain rating scale, medication(s)/side effects and non-pharmacologic comfort measures Outcome: Progressing   Problem: Health Behavior/Discharge Planning: Goal: Ability to manage health-related needs will improve Outcome: Progressing   Problem: Clinical Measurements: Goal: Ability to maintain clinical measurements within normal limits will improve Outcome: Progressing Goal: Will remain free from infection Outcome: Progressing Goal: Diagnostic test results will improve Outcome: Progressing Goal: Respiratory complications will improve Outcome: Progressing Goal: Cardiovascular complication will be avoided Outcome: Progressing   Problem: Activity: Goal: Risk for activity intolerance will decrease Outcome: Progressing   Problem: Nutrition: Goal: Adequate nutrition will be maintained Outcome: Progressing

## 2023-10-04 NOTE — Progress Notes (Signed)
Physical Therapy Note  (Full treatment note to follow)  SATURATION QUALIFICATIONS: (This note is used to comply with regulatory documentation for home oxygen)  Patient Saturations on Room Air at Rest = 95%  Patient Saturations on Room Air while Ambulating = 88% (lowest observed)  Did not need supplemental O2 to boost O2 sats back up   Patient requires supplemental oxygen to maintain oxygen saturations at or above 88% with physical activity.   Van Clines, PT  Acute Rehabilitation Services Office [phone] Secure Chat welcomed

## 2023-10-04 NOTE — Plan of Care (Signed)
  Problem: Activity: Goal: Ability to tolerate increased activity will improve Outcome: Progressing   Problem: Clinical Measurements: Goal: Ability to maintain a body temperature in the normal range will improve Outcome: Progressing   Problem: Respiratory: Goal: Ability to maintain adequate ventilation will improve Outcome: Progressing Goal: Ability to maintain a clear airway will improve Outcome: Progressing   

## 2023-10-04 NOTE — Progress Notes (Signed)
Physical Therapy Treatment Patient Details Name: Tracy [name] MRN: [id number] DOB: [birth year] Today's Date: 10/04/2023   History of Present Illness Pt is a 77 y.o. female admitted 12/13 for multifocal PNA. PMH:  breast cancer, CAD, chronic diastolic CHF, COPD, HTN, hyperlipidemia, depression/anxiety, OSA    PT Comments  Continuing work on functional mobility and activity tolerance;  Session focused on progressive amb, with close monitor of O2 sats on room air; Pt abel to incr distance walked, and lowest O2 sat observed was 88%; Did not need to turn on supplemental O2 for O2 sat to incr back to mid 90s -- just for focused breathing and standing rest breaks as needed;  a Rollaotr will be helpful for her (we used the rollator today), and she has one at home   If plan is discharge home, recommend the following: Assistance with cooking/housework;Assist for transportation;A little help with bathing/dressing/bathroom;Help with stairs or ramp for entrance   Can travel by private vehicle        Equipment Recommendations  Other (comment) (can consider home O2 for prn)    Recommendations for Other Services       Precautions / Restrictions Precautions Precautions: Fall;Other (comment) Precaution Comments: watch sats Restrictions Weight Bearing Restrictions Per Provider Order: No     Mobility  Bed Mobility Overal bed mobility: Needs Assistance Bed Mobility: Supine to Sit     Supine to sit: Supervision, Used rails, HOB elevated     General bed mobility comments: Supervision for line management mostly    Transfers Overall transfer level: Needs assistance   Transfers: Sit to/from Stand Sit to Stand: Supervision           General transfer comment: Smooth rise from EOB slightly elevated    Ambulation/Gait Ambulation/Gait assistance: Supervision Gait Distance (Feet): 200 Feet Assistive device: Rollator (4 wheels) Gait Pattern/deviations: Step-through pattern, Decreased  stride length Gait velocity: slowed     General Gait Details: Walked in the hallway with Rollaotr RW; second person helpful for managing IV Pole and vitals machine for close monitor of O2 sats   Stairs             Wheelchair Mobility     Tilt Bed    Modified Rankin (Stroke Patients Only)       Balance     Sitting balance-Leahy Scale: Fair       Standing balance-Leahy Scale: Fair                              Cognition Arousal: Alert Behavior During Therapy: WFL for tasks assessed/performed Overall Cognitive Status: Within Functional Limits for tasks assessed                                          Exercises      General Comments General comments (skin integrity, edema, etc.): O2 sats decr to 88% observed lowest; didnot need supplemental O2 to boost sats back to the 90s      Pertinent Vitals/Pain Pain Assessment Pain Assessment: No/denies pain    Home Living                          Prior Function            PT Goals (current goals can now be found in the care  plan section) Acute Rehab PT Goals Patient Stated Goal: home PT Goal Formulation: With patient Time For Goal Achievement: 10/15/23 Potential to Achieve Goals: Good Progress towards PT goals: Progressing toward goals    Frequency    Min 1X/week      PT Plan      Co-evaluation              AM-PAC PT "6 Clicks" Mobility   Outcome Measure  Help needed turning from your back to your side while in a flat bed without using bedrails?: None Help needed moving from lying on your back to sitting on the side of a flat bed without using bedrails?: A Little Help needed moving to and from a bed to a chair (including a wheelchair)?: A Little Help needed standing up from a chair using your arms (e.g., wheelchair or bedside chair)?: A Little Help needed to walk in hospital room?: A Little Help needed climbing 3-5 steps with a railing? : A Lot 6  Click Score: 18    End of Session Equipment Utilized During Treatment: Gait belt Activity Tolerance: Patient tolerated treatment well Patient left: in bed;with call bell/phone within reach (sitting EOB to eat lunch) Nurse Communication: Mobility status PT Visit Diagnosis: Other abnormalities of gait and mobility (R26.89);Muscle weakness (generalized) (M62.81)     Time: 2440-1027 PT Time Calculation (min) (ACUTE ONLY): 22 min  Charges:    $Gait Training: 8-22 mins PT General Charges $$ ACUTE PT VISIT: 1 Visit                     Van Clines, PT  Acute Rehabilitation Services Office [phone] Secure Chat welcomed    Tracy [name] 10/04/2023, 6:55 PM

## 2023-10-05 DIAGNOSIS — R0902 Hypoxemia: Secondary | ICD-10-CM | POA: Diagnosis not present

## 2023-10-05 DIAGNOSIS — I10 Essential (primary) hypertension: Secondary | ICD-10-CM | POA: Diagnosis not present

## 2023-10-05 DIAGNOSIS — J9601 Acute respiratory failure with hypoxia: Secondary | ICD-10-CM

## 2023-10-05 DIAGNOSIS — J180 Bronchopneumonia, unspecified organism: Secondary | ICD-10-CM | POA: Diagnosis not present

## 2023-10-05 DIAGNOSIS — R531 Weakness: Secondary | ICD-10-CM

## 2023-10-05 DIAGNOSIS — J189 Pneumonia, unspecified organism: Secondary | ICD-10-CM | POA: Diagnosis not present

## 2023-10-05 LAB — BASIC METABOLIC PANEL
Anion gap: 7 (ref 5–15)
BUN: 29 mg/dL — ABNORMAL HIGH (ref 8–23)
CO2: 26 mmol/L (ref 22–32)
Calcium: 8.6 mg/dL — ABNORMAL LOW (ref 8.9–10.3)
Chloride: 107 mmol/L (ref 98–111)
Creatinine, Ser: 1.27 mg/dL — ABNORMAL HIGH (ref 0.44–1.00)
GFR, Estimated: 44 mL/min — ABNORMAL LOW (ref >60)
Glucose, Bld: 107 mg/dL — ABNORMAL HIGH (ref 70–99)
Potassium: 3.5 mmol/L (ref 3.5–5.1)
Sodium: 140 mmol/L (ref 135–145)

## 2023-10-05 MED ORDER — SPIRONOLACTONE 25 MG PO TABS: 12.5000 mg | ORAL_TABLET | Freq: Every day | ORAL | Status: DC

## 2023-10-05 MED ORDER — DOXYCYCLINE HYCLATE 100 MG PO TABS: 100.0000 mg | ORAL_TABLET | Freq: Two times a day (BID) | ORAL | 0 refills | Status: AC

## 2023-10-05 MED ORDER — DOXYCYCLINE HYCLATE 100 MG PO TABS
100.0000 mg | ORAL_TABLET | Freq: Two times a day (BID) | ORAL | Status: DC
  Administered 2023-10-05: 100 mg via ORAL
  Filled 2023-10-05: qty 1

## 2023-10-05 NOTE — Progress Notes (Signed)
DISCHARGE NOTE HOME Tracy [name] to be discharged Home per MD order. Discussed prescriptions and follow up appointments with the patient. Prescriptions given to patient; medication list explained in detail. Patient verbalized understanding.  Skin clean, dry and intact without evidence of skin break down, no evidence of skin tears noted. IV catheter discontinued intact. Site without signs and symptoms of complications. Dressing and pressure applied. Pt denies pain at the site currently. No complaints noted.  Patient free of lines, drains, and wounds.   An After Visit Summary (AVS) was printed and given to the patient. Patient escorted via wheelchair, and discharged home via private auto.  Margarita Grizzle, RN

## 2023-10-05 NOTE — Plan of Care (Signed)
?  Problem: Respiratory: ?Goal: Ability to maintain adequate ventilation will improve ?Outcome: Progressing ?  ?Problem: Respiratory: ?Goal: Ability to maintain a clear airway will improve ?Outcome: Progressing ?  ?

## 2023-10-05 NOTE — Progress Notes (Signed)
SATURATION QUALIFICATIONS:   Patient Saturations on Room Air at Rest = 95 Patient Saturations on Room Air while Ambulating = 90 Patient Saturations on 2 Liters of oxygen while Ambulating = 98  Please briefly explain why patient needs home oxygen: She is SOB while ambulating

## 2023-10-05 NOTE — Progress Notes (Addendum)
Mobility Specialist: Progress Note   10/05/23 0956  Mobility  Activity Ambulated with assistance in hallway  Level of Assistance Standby assist, set-up cues, supervision of patient - no hands on  Assistive Device Four wheel walker  Distance Ambulated (ft) 175 ft  Activity Response Tolerated well  Mobility Referral Yes  Mobility visit 1 Mobility  Mobility Specialist Start Time (ACUTE ONLY) 0920  Mobility Specialist Stop Time (ACUTE ONLY) 0940  Mobility Specialist Time Calculation (min) (ACUTE ONLY) 20 min    Pt was agreeable to mobility session, amb sat test requested by RN - received pt in bed. C/o SOB throughout session. Ambulated with SV with rollator, SpO2 maintained 90-95% throughout session. Took 4x standing break while MS checked O2 reading with dinomap, pt also feeling SOB and fatigued. Returned to room without fault. Left on RA with all needs met, call bell in reach. RN notified.   Maurene Capes Mobility Specialist Please contact via SecureChat or Rehab office at [phone]

## 2023-10-05 NOTE — Discharge Summary (Signed)
Physician Discharge Summary  Patient: Tracy [name] JSE:[id number] DOB: 09-14-1946   Code Status: Full Code Admit date: 09/29/2023 Discharge date: 10/05/2023 Disposition: Home, outpatient PT, OT PCP: [place], [name], MD  Recommendations for Outpatient Follow-up:  Follow up with PCP within 1-2 weeks Regarding general hospital follow up and preventative care Recommend weaning from oxygen as able.   Discharge Diagnoses:  Principal Problem:   Multifocal pneumonia Active Problems:   Mixed hyperlipidemia   Essential hypertension   Chronic heart failure with preserved ejection fraction (HCC)   Obesity   COPD (chronic obstructive pulmonary disease) (HCC)   CKD (chronic kidney disease), stage III (HCC)   Venous stasis dermatitis of both lower extremities   Hypoxia   Weakness   Bronchopneumonia  Brief Hospital Course Summary: Tracy [name] is a 77 y.o. female with a history of CAD, chronic HFpEF, chronic LLE edema, COPD, HTN, HLD, depression/anxiety, OSA, and breast CA who presented to the ED on 09/29/2023 with productive cough, congestion, chest pain and dyspnea. She was tachypneic and hypoxic with 2 view CXR demonstrating multifocal patchy opacities and bronchitic changes with small pleural effusions. WBC mildly elevated as was BNP. Antibiotics were given and the patient was admitted for acute hypoxic respiratory failure due to multifocal pneumonia.   She had very slow progression with treatment but by the day of discharge, she was able to ambulate without desaturation below 90% O2 ORA. She continued to feel SOB and had already qualified for home oxygen so was continued on PRN oxygen at discharge and will likely be able to have it discontinued soon.   She was continued on her oral doxycycline course but otherwise discharged on her chronic home meds.   Discharge Condition: Good, improved Recommended discharge diet: Regular healthy diet  Consultations: None    Procedures/Studies: None   Discharge Instructions     Ambulatory referral to Physical Therapy   Complete by: As directed       Allergies as of 10/05/2023       Reactions   Ak-mycin [erythromycin] Rash   Sulfamethoxazole Rash        Medication List     TAKE these medications    acetaminophen 650 MG CR tablet Commonly known as: TYLENOL Take 1,300 mg by mouth 2 (two) times daily as needed for pain.   allopurinol 100 MG tablet Commonly known as: ZYLOPRIM TAKE 1 TABLET BY MOUTH EVERY DAY   amLODipine 5 MG tablet Commonly known as: NORVASC TAKE 1 TABLET (5 MG TOTAL) BY MOUTH DAILY.   clopidogrel 75 MG tablet Commonly known as: PLAVIX TAKE 1 TABLET BY MOUTH EVERY DAY WITH BREAKFAST   doxycycline 100 MG tablet Commonly known as: VIBRA-TABS Take 1 tablet (100 mg total) by mouth every 12 (twelve) hours for 4 days.   estradiol 0.1 MG/GM vaginal cream Commonly known as: ESTRACE VAGINAL Apply a pea sized amount just inside the vagina nightly for 2 weeks, then twice weekly thereafter What changed:  how much to take how to take this when to take this   ezetimibe 10 MG tablet Commonly known as: ZETIA Take 1 tablet (10 mg total) by mouth daily.   isosorbide mononitrate 30 MG 24 hr tablet Commonly known as: IMDUR TAKE 1 TABLET BY MOUTH EVERY DAY   losartan 100 MG tablet Commonly known as: COZAAR Take 1 tablet (100 mg total) by mouth daily.   metoprolol succinate 100 MG 24 hr tablet Commonly known as: TOPROL-XL TAKE 1 TABLET BY MOUTH  EVERY DAY WITH OR IMMEDIATELY FOLLOWING A MEAL   naproxen sodium 220 MG tablet Commonly known as: ALEVE Take 220 mg by mouth daily as needed (severe arthritis pain).   nitroGLYCERIN 0.4 MG SL tablet Commonly known as: NITROSTAT PLACE 1 TABLET UNDER THE TONGUE EVERY 5 MINUTES AS NEEDED FOR CHEST PAIN   pantoprazole 40 MG tablet Commonly known as: PROTONIX TAKE 1 TABLET BY MOUTH EVERY DAY   pravastatin 40 MG tablet Commonly  known as: PRAVACHOL TAKE 1 TABLET BY MOUTH EVERY DAY IN THE EVENING   spironolactone 25 MG tablet Commonly known as: ALDACTONE Take 0.5 tablets (12.5 mg total) by mouth daily.   traMADol 50 MG tablet Commonly known as: ULTRAM Take 0.5 tablets (25 mg total) by mouth every 12 (twelve) hours as needed.   triamcinolone cream 0.1 % Commonly known as: KENALOG Apply topically daily as needed (for leg dermatitis.).               Durable Medical Equipment  (From admission, onward)           Start     Ordered   10/03/23 1719  For home use only DME oxygen  Once       Question Answer Comment  Length of Need 6 Months   Mode or (Route) Nasal cannula   Liters per Minute 2   Frequency Continuous (stationary and portable oxygen unit needed)   Oxygen delivery system Gas      10/03/23 1718            Follow-up Information     [facility] Follow up.   Specialty: Rehabilitation Why: Call to schedule first appointment. Contact information: [address] [place] [address] [phone]        [place], [name], MD. Schedule an appointment as soon as possible for a visit in 1 week(s).   Specialty: Family Medicine Contact information: [address] Christena Flake [place] [address] [phone]                 Subjective   Pt reports feeling well. She is tolerating ambulating with PT and still feels short of breath with exertion. She is afraid of walking up the 4 stairs to her home but her and husband have plan for it and she feels comfortable going. Not short of breath at rest.   All questions and concerns were addressed at time of discharge.  Objective  Blood pressure (!) 147/66, pulse 90, temperature (!) 97.5 F (36.4 C), temperature source Oral, resp. rate (!) 22, weight 99.6 kg, SpO2 95%.   General: Pt is alert, awake, not in acute distress Cardiovascular: RRR, S1/S2 +, no rubs, no  gallops Respiratory: CTA bilaterally, no wheezing, no rhonchi Abdominal: Soft, NT, ND, bowel sounds + Extremities: no edema, no cyanosis  The results of significant diagnostics from this hospitalization (including imaging, microbiology, ancillary and laboratory) are listed below for reference.   Imaging studies: DG Chest 2 View Result Date: 10/03/2023 CLINICAL DATA:  [id number] CAP (community acquired pneumonia) [id number] [id number] Bronchitis [id number] Acute respiratory failure with hypoxia (HCC) 160737 EXAM: CHEST - 2 VIEW COMPARISON:  09/28/2013 FINDINGS: Stable heart size. Aortic atherosclerosis. Diffuse bilateral interstitial opacities with slightly improving aeration of the lung bases. No large pleural fluid collection. No pneumothorax. IMPRESSION: Slightly improving aeration of the lung bases. Otherwise, stable radiographic appearance of the chest. Electronically Signed   By: Duanne Guess D.O.   On: 10/03/2023 15:11  DG Chest 2 View Result Date: 09/29/2023 CLINICAL DATA:  77 year old female with history of shortness of breath. EXAM: CHEST - 2 VIEW COMPARISON:  Chest x-ray 10/11/2022. FINDINGS: Lung volumes are normal. Diffuse interstitial prominence and widespread peribronchial cuffing with some patchy ill-defined opacities scattered throughout the lungs bilaterally. Small bilateral pleural effusions. No pneumothorax. Pulmonary vasculature is largely obscured. Heart size appears borderline enlarged. The patient is rotated to the left on today's exam, resulting in distortion of the mediastinal contours and reduced diagnostic sensitivity and specificity for mediastinal pathology. Atherosclerotic calcifications are noted in the thoracic aorta. IMPRESSION: 1. The appearance the chest suggests severe bronchitis likely with developing multilobar bilateral bronchopneumonia. 2. Small bilateral pleural effusions. 3. Aortic atherosclerosis. Electronically Signed   By: Trudie Reed M.D.   On: 09/29/2023  06:55    Labs: Basic Metabolic Panel: Recent Labs  Lab 10/01/23 0626 10/02/23 0354 10/03/23 0358 10/04/23 0451 10/05/23 0408  NA 139 136 139 140 140  K 3.4* 4.2 3.6 3.7 3.5  CL 109 108 106 107 107  CO2 21* 24 24 25 26   GLUCOSE 124* 114* 103* 108* 107*  BUN 13 17 25* 27* 29*  CREATININE 1.29* 1.20* 1.29* 1.33* 1.27*  CALCIUM 8.9 8.5* 8.7* 8.4* 8.6*   CBC: Recent Labs  Lab 09/29/23 0639 10/01/23 0626 10/02/23 0354 10/03/23 0358  WBC 11.9* 11.3* 9.4 9.2  HGB 13.1 11.1* 9.8* 10.7*  HCT 43.1 35.4* 32.3* 34.6*  MCV 87.1 84.3 87.5 85.6  PLT 273 227 208 229   Microbiology: Results for orders placed or performed during the hospital encounter of 09/29/23  SARS Coronavirus 2 by RT PCR (hospital order, performed in [facility] hospital lab) *cepheid single result test* Anterior Nasal Swab     Status: None   Collection Time: 09/29/23  7:21 AM   Specimen: Anterior Nasal Swab  Result Value Ref Range Status   SARS Coronavirus 2 by RT PCR NEGATIVE NEGATIVE Final    Comment: Performed at [facility], [address], [place], [place] [address]  Blood culture (routine x 2)     Status: None   Collection Time: 09/29/23  7:28 AM   Specimen: BLOOD  Result Value Ref Range Status   Specimen Description BLOOD RIGHT ANTECUBITAL  Final   Special Requests   Final    BOTTLES DRAWN AEROBIC AND ANAEROBIC Blood Culture adequate volume   Culture   Final    NO GROWTH 5 DAYS Performed at [facility], [address], [place], [place] [address]    Report Status 10/04/2023 FINAL  Final  Blood culture (routine x 2)     Status: None   Collection Time: 09/29/23  7:40 AM   Specimen: BLOOD  Result Value Ref Range Status   Specimen Description BLOOD BLOOD LEFT HAND  Final   Special Requests   Final    BOTTLES DRAWN AEROBIC AND ANAEROBIC Blood Culture results may not be optimal due to an inadequate volume of blood received in culture bottles   Culture   Final    NO GROWTH 5  DAYS Performed at [facility], [address], [place], [place] [address]    Report Status 10/04/2023 FINAL  Final  Respiratory (~20 pathogens) panel by PCR     Status: None   Collection Time: 10/01/23  1:58 PM   Specimen: Nasopharyngeal Swab; Respiratory  Result Value Ref Range Status   Adenovirus NOT DETECTED NOT DETECTED Final   Coronavirus 229E NOT DETECTED  NOT DETECTED Final    Comment: (NOTE) The Coronavirus on the Respiratory Panel, DOES NOT test for the novel  Coronavirus (2019 nCoV)    Coronavirus HKU1 NOT DETECTED NOT DETECTED Final   Coronavirus NL63 NOT DETECTED NOT DETECTED Final   Coronavirus OC43 NOT DETECTED NOT DETECTED Final   Metapneumovirus NOT DETECTED NOT DETECTED Final   Rhinovirus / Enterovirus NOT DETECTED NOT DETECTED Final   Influenza A NOT DETECTED NOT DETECTED Final   Influenza B NOT DETECTED NOT DETECTED Final   Parainfluenza Virus 1 NOT DETECTED NOT DETECTED Final   Parainfluenza Virus 2 NOT DETECTED NOT DETECTED Final   Parainfluenza Virus 3 NOT DETECTED NOT DETECTED Final   Parainfluenza Virus 4 NOT DETECTED NOT DETECTED Final   Respiratory Syncytial Virus NOT DETECTED NOT DETECTED Final   Bordetella pertussis NOT DETECTED NOT DETECTED Final   Bordetella Parapertussis NOT DETECTED NOT DETECTED Final   Chlamydophila pneumoniae NOT DETECTED NOT DETECTED Final   Mycoplasma pneumoniae NOT DETECTED NOT DETECTED Final    Comment: Performed at [facility], [address], [place], [place] [address]   Time coordinating discharge: Over 30 minutes  Leeroy Bock, MD  [facility] 10/05/2023, 11:29 AM

## 2023-10-05 NOTE — Progress Notes (Signed)
Nurse requested Mobility Specialist to perform oxygen saturation test with pt which includes removing pt from oxygen both at rest and while ambulating.  Below are the results from that testing.     Patient Saturations on Room Air while Ambulating = sp02 90-95% .   Patient Saturations on N/A Liters of oxygen while Ambulating = sp02 N/A%  At end of testing pt left in room on RA.   Reported results to nurse.

## 2023-10-05 NOTE — Discharge Instructions (Signed)
Please set up an appointment with your primary care doctor at earliest availability to recheck your breathing and discontinue your oxygen as needed.

## 2023-10-05 NOTE — TOC Transition Note (Signed)
Transition of Care [facility]) - Discharge Note   Patient Details  Name: Tracy [name] MRN: [id number] Date of Birth: 1946/07/02  Transition [facility]) CM/SW Contact:  Tom-Johnson, Hershal Coria, RN Phone Number: 10/05/2023, 11:56 AM   Clinical Narrative:     Patient is scheduled for discharge today.  Readmission Risk Assessment done. Outpatient referral, hospital f/u and discharge instructions on AVS. Home O2 delivered to patient at bedside by [employer].  Husband, Windy Fast to transport at discharge.  No further TOC needs noted.            Final next level of care: OP Rehab Barriers to Discharge: Barriers Resolved   Patient Goals and CMS Choice Patient states their goals for this hospitalization and ongoing recovery are:: To return home CMS Medicare.gov Compare Post Acute Care list provided to:: Patient Choice offered to / list presented to : Patient      Discharge Placement                Patient to be transferred to facility by: Husband Name of family member notified: Windy Fast    Discharge Plan and Services Additional resources added to the After Visit Summary for                  DME Arranged: N/A DME Agency: NA       HH Arranged: NA HH Agency: NA        Social Drivers of Health (SDOH) Interventions SDOH Screenings   Food Insecurity: No Food Insecurity (09/29/2023)  Housing: Low Risk  (09/29/2023)  Transportation Needs: No Transportation Needs (09/29/2023)  Utilities: Not At Risk (09/29/2023)  Depression (PHQ2-9): Medium Risk (09/08/2023)  Financial Resource Strain: Low Risk  (08/03/2023)  Physical Activity: Unknown (08/03/2023)  Social Connections: Unknown (08/03/2023)  Stress: Stress Concern Present (08/03/2023)  Tobacco Use: Medium Risk (09/29/2023)     Readmission Risk Interventions    10/03/2023    5:25 PM  Readmission Risk Prevention Plan  Transportation Screening Complete  PCP or Specialist Appt within 5-7 Days Complete   Home Care Screening Complete  Medication Review (RN CM) Referral to Pharmacy

## 2023-10-06 ENCOUNTER — Telehealth: Payer: Self-pay

## 2023-10-06 NOTE — Transitions of Care (Post Inpatient/ED Visit) (Signed)
10/06/2023  Name: Tracy [name] MRN: [id number] DOB: 1946-05-02  Today's TOC FU Call Status: Today's TOC FU Call Status:: Successful TOC FU Call Completed TOC FU Call Complete Date: 10/06/23 Patient's Name and Date of Birth confirmed.  Transition Care Management Follow-up Telephone Call Date of Discharge: 10/05/23 Discharge Facility: [facility]) Type of Discharge: Inpatient Admission Primary Inpatient Discharge Diagnosis:: "bronchopneumonia" How have you been since you were released from the hospital?: Better (Pt voices she had an "upset stomach overnight but feeling better"-drank some ginger ale and ate some crackers-she ate lunch a few mins ago and no issues-no breathing issues-hasn't used oxygen, leg looks better-not draining-taking abxs) Any questions or concerns?: No  Items Reviewed: Did you receive and understand the discharge instructions provided?: Yes Medications obtained,verified, and reconciled?: Yes (Medications Reviewed) Any new allergies since your discharge?: No Dietary orders reviewed?: Yes Type of Diet Ordered:: low salt/heart healthy Do you have support at home?: Yes People in Home: spouse Name of Support/Comfort Primary Source: Windy Fast  Medications Reviewed Today: Medications Reviewed Today     Reviewed by Charlyn Minerva, RN (Registered Nurse) on 10/06/23 at 1505  Med List Status: <None>   Medication Order Taking? Sig Documenting Provider Last Dose Status Informant  acetaminophen (TYLENOL) 650 MG CR tablet [id number] Yes Take 1,300 mg by mouth 2 (two) times daily as needed for pain. [provider] Taking Active Self, Pharmacy Records  allopurinol (ZYLOPRIM) 100 MG tablet [id number] Yes TAKE 1 TABLET BY MOUTH EVERY DAY [place], [name], MD 10/05/2023 Active Self, Pharmacy Records  amLODipine (NORVASC) 5 MG tablet [id number] Yes TAKE 1 TABLET (5 MG TOTAL) BY MOUTH DAILY. Tonny Bollman, MD 10/05/2023 Active Self, Pharmacy Records   clopidogrel (PLAVIX) 75 MG tablet [id number] Yes TAKE 1 TABLET BY MOUTH EVERY DAY WITH Concha Se, MD 10/05/2023 Active Self, Pharmacy Records  doxycycline (VIBRA-TABS) 100 MG tablet [id number] Yes Take 1 tablet (100 mg total) by mouth every 12 (twelve) hours for 4 days. Leeroy Bock, MD 10/06/2023 Active   estradiol (ESTRACE VAGINAL) 0.1 MG/GM vaginal cream [id number] Yes Apply a pea sized amount just inside the vagina nightly for 2 weeks, then twice weekly thereafter  Patient taking differently: Place 1 Applicatorful vaginally at bedtime. Apply a pea sized amount just inside the vagina nightly for 2 weeks, then twice weekly thereafter   Lennart Pall, MD Taking Active Self, Pharmacy Records  ezetimibe (ZETIA) 10 MG tablet [id number] Yes Take 1 tablet (10 mg total) by mouth daily. Tonny Bollman, MD 10/05/2023 Active Self, Pharmacy Records  isosorbide mononitrate (IMDUR) 30 MG 24 hr tablet [id number] Yes TAKE 1 TABLET BY MOUTH EVERY DAY Beatrice Lecher, PA-C 10/05/2023 Active Self, Pharmacy Records  losartan (COZAAR) 100 MG tablet [id number] Yes Take 1 tablet (100 mg total) by mouth daily. [place], [name], MD 10/05/2023 Active Self, Pharmacy Records  metoprolol succinate (TOPROL-XL) 100 MG 24 hr tablet [id number] Yes TAKE 1 TABLET BY MOUTH EVERY DAY WITH OR IMMEDIATELY FOLLOWING A MEAL Tonny Bollman, MD 10/05/2023 Active Self, Pharmacy Records  naproxen sodium (ALEVE) 220 MG tablet [id number] Yes Take 220 mg by mouth daily as needed (severe arthritis pain). [provider] Taking Active Self, Pharmacy Records  nitroGLYCERIN (NITROSTAT) 0.4 MG SL tablet [id number] Yes PLACE 1 TABLET UNDER THE TONGUE EVERY 5 MINUTES AS NEEDED FOR CHEST PAIN Tonny Bollman, MD Taking Active Self, Pharmacy Records  pantoprazole (PROTONIX) 40 MG tablet [id number] Yes TAKE 1 TABLET BY MOUTH EVERY DAY [place], Betty  G, MD 10/05/2023 Active Self, Pharmacy Records  pravastatin (PRAVACHOL) 40 MG  tablet [id number] Yes TAKE 1 TABLET BY MOUTH EVERY DAY IN THE Elmer Ramp, MD 10/05/2023 Active Self, Pharmacy Records  spironolactone (ALDACTONE) 25 MG tablet [id number] Yes Take 0.5 tablets (12.5 mg total) by mouth daily. Leeroy Bock, MD 10/05/2023 Active   traMADol (ULTRAM) 50 MG tablet [id number] Yes Take 0.5 tablets (25 mg total) by mouth every 12 (twelve) hours as needed. [place], [name], MD Taking Active Self, Pharmacy Records  triamcinolone cream (KENALOG) 0.1 % [id number] Yes Apply topically daily as needed (for leg dermatitis.). [place], [name], MD Taking Active Self, Pharmacy Records           Med Note (COFFELL, Marzella Schlein   Fri Sep 29, 2023 10:03 AM) Pt has available but unsure of last use.            Home Care and Equipment/Supplies: Were Home Health Services Ordered?: NA (Pt set up with outpatient therapy-states she does not want to start until after the Christmas holiday) Any new equipment or medical supplies ordered?: Yes Name of Medical supply agency?: [employer]-pt states he has not had to use it Were you able to get the equipment/medical supplies?: Yes Do you have any questions related to the use of the equipment/supplies?: No  Functional Questionnaire: Do you need assistance with bathing/showering or dressing?: No Do you need assistance with meal preparation?: No Do you need assistance with eating?: No Do you have difficulty maintaining continence: No Do you need assistance with getting out of bed/getting out of a chair/moving?: No Do you have difficulty managing or taking your medications?: No  Follow up appointments reviewed: PCP Follow-up appointment confirmed?: Yes Date of PCP follow-up appointment?: 10/16/23 Follow-up Provider: Dr. [place] Specialist [facility] Follow-up appointment confirmed?: NA Do you need transportation to your follow-up appointment?: No (pt confirms spouse takes her to appts) Do you understand care options if your  condition(s) worsen?: Yes-patient verbalized understanding  SDOH Interventions Today    Flowsheet Row Most Recent Value  SDOH Interventions   Food Insecurity Interventions Intervention Not Indicated  Housing Interventions Intervention Not Indicated  Transportation Interventions Intervention Not Indicated  Utilities Interventions Intervention Not Indicated      TOC Interventions Today    Flowsheet Row Most Recent Value  TOC Interventions   TOC Interventions Discussed/Reviewed TOC Interventions Discussed, Arranged PCP follow up less than 12 days/Care Guide scheduled, S/S of infection, Post op wound/incision care  [discussed leg wound care- pt was not given any instructions at d/c-states she has Xerofoam gauze on now-feels like it is really helping/working and will get some from pharmacy and continue to use and change daily]      Interventions Today    Flowsheet Row Most Recent Value  Chronic Disease   Chronic disease during today's visit Hypertension (HTN), Congestive Heart Failure (CHF)  General Interventions   General Interventions Discussed/Reviewed General Interventions Discussed, Durable Medical Equipment (DME), Doctor Visits  Doctor Visits Discussed/Reviewed Doctor Visits Discussed, PCP, Specialist  Durable Medical Equipment (DME) Oxygen  [pt reports she has not used oxygen since coming home-"thinking about returning it'-advised to keep on hand in case she does ned it-she will look into getting pulse ox to monitor oxygen level at home-reviewed normal readings]  PCP/Specialist Visits Compliance with follow-up visit  Education Interventions   Education Provided Provided Education  Provided Verbal Education On Nutrition, Medication, When to see the doctor, Other  Nutrition Interventions   Nutrition Discussed/Reviewed Nutrition  Discussed, Adding fruits and vegetables, Decreasing salt, Fluid intake  Pharmacy Interventions   Pharmacy Dicussed/Reviewed Pharmacy Topics Discussed,  Medications and their functions  Safety Interventions   Safety Discussed/Reviewed Safety Discussed, Home Safety       Antionette Fairy, RN,BSN,CCM RN Care Manager Transitions of Care  [place]  Direct Phone: [phone] Toll Free: [phone] Fax: [phone]

## 2023-10-06 NOTE — Transitions of Care (Post Inpatient/ED Visit) (Signed)
   10/06/2023  Name: Tracy [name] MRN: [id number] DOB: 03-22-1946  Today's TOC FU Call Status: Today's TOC FU Call Status:: Unsuccessful Call (1st Attempt) Unsuccessful Call (1st Attempt) Date: 10/06/23  Attempted to reach the patient regarding the most recent Inpatient/ED visit.  Follow Up Plan: Additional outreach attempts will be made to reach the patient to complete the Transitions of Care (Post Inpatient/ED visit) call.      Antionette Fairy, RN,BSN,CCM RN Care Manager Transitions of Care  [place]  Direct Phone: [phone] Toll Free: [phone] Fax: [phone]

## 2023-10-12 ENCOUNTER — Ambulatory Visit (HOSPITAL_COMMUNITY): Payer: Medicare Other

## 2023-10-16 ENCOUNTER — Ambulatory Visit (INDEPENDENT_AMBULATORY_CARE_PROVIDER_SITE_OTHER): Payer: Medicare Other | Admitting: Family Medicine

## 2023-10-16 ENCOUNTER — Encounter: Payer: Self-pay | Admitting: Family Medicine

## 2023-10-16 VITALS — BP 112/60 | HR 88 | Temp 98.8°F | Resp 16 | Ht 64.0 in

## 2023-10-16 DIAGNOSIS — R0902 Hypoxemia: Secondary | ICD-10-CM

## 2023-10-16 DIAGNOSIS — J189 Pneumonia, unspecified organism: Secondary | ICD-10-CM

## 2023-10-16 DIAGNOSIS — J9601 Acute respiratory failure with hypoxia: Secondary | ICD-10-CM

## 2023-10-16 DIAGNOSIS — I10 Essential (primary) hypertension: Secondary | ICD-10-CM

## 2023-10-16 DIAGNOSIS — N1831 Chronic kidney disease, stage 3a: Secondary | ICD-10-CM

## 2023-10-16 DIAGNOSIS — I872 Venous insufficiency (chronic) (peripheral): Secondary | ICD-10-CM

## 2023-10-16 DIAGNOSIS — R5381 Other malaise: Secondary | ICD-10-CM

## 2023-10-16 LAB — BASIC METABOLIC PANEL
BUN: 23 mg/dL (ref 6–23)
CO2: 28 meq/L (ref 19–32)
Calcium: 9.1 mg/dL (ref 8.4–10.5)
Chloride: 105 meq/L (ref 96–112)
Creatinine, Ser: 1.09 mg/dL (ref 0.40–1.20)
GFR: 49.69 mL/min — ABNORMAL LOW (ref >60.00)
Glucose, Bld: 96 mg/dL (ref 70–99)
Potassium: 4.4 meq/L (ref 3.5–5.1)
Sodium: 141 meq/L (ref 135–145)

## 2023-10-16 LAB — CBC
HCT: 36 % (ref 36.0–46.0)
Hemoglobin: 11.5 g/dL — ABNORMAL LOW (ref 12.0–15.0)
MCHC: 32 g/dL (ref 30.0–36.0)
MCV: 83.8 fL (ref 78.0–100.0)
Platelets: 273 10*3/uL (ref 150.0–400.0)
RBC: 4.3 Mil/uL (ref 3.87–5.11)
RDW: 16.7 % — ABNORMAL HIGH (ref 11.5–15.5)
WBC: 8.5 10*3/uL (ref 4.0–10.5)

## 2023-10-16 MED ORDER — FUROSEMIDE 20 MG PO TABS: 20.0000 mg | ORAL_TABLET | Freq: Every day | ORAL | 2 refills | Status: DC | PRN

## 2023-10-16 MED ORDER — DOXYCYCLINE HYCLATE 100 MG PO TABS: 100.0000 mg | ORAL_TABLET | Freq: Two times a day (BID) | ORAL | 0 refills | Status: AC

## 2023-10-16 NOTE — Patient Instructions (Addendum)
A few things to remember from today's visit:  Multifocal pneumonia - Plan: CBC  Essential hypertension - Plan: Basic metabolic panel  Acute hypoxic respiratory failure (HCC)  Stage 3a chronic kidney disease (HCC)  Venous stasis dermatitis of both lower extremities - Plan: Ambulatory referral to Wound Clinic, furosemide (LASIX) 20 MG tablet  Physical deconditioning - Plan: Ambulatory referral to Physical Therapy  Doxycycline 100 mg 2 times daily for 7 more days. Lower extremity elevation. Wound clinic appt will be arranged. Take Furosemide daily for 5 days then as needed.  If you need refills for medications you take chronically, please call your pharmacy. Do not use My Chart to request refills or for acute issues that need immediate attention. If you send a my chart message, it may take a few days to be addressed, specially if I am not in the office.  Please be sure medication list is accurate. If a new problem present, please set up appointment sooner than planned today.

## 2023-10-17 NOTE — Assessment & Plan Note (Signed)
Missed appt with nephrologist due to hospitalization. Cr and e GFR have improved, 1.2 and 40 respectively. Continue adequate hydration, low salt diet, and avoidance of NSAID's. Continue Losartan.

## 2023-10-17 NOTE — Assessment & Plan Note (Signed)
 BP adequately controlled. She does not recall which medication she is taking. Continue amlodipine  5 mg daily, spironolactone  25 mg one half daily, Imdur  30 mg daily, metoprolol  succinate 100 mg daily as well as low salt diet to continue. Monitor BP at home.

## 2023-10-17 NOTE — Assessment & Plan Note (Signed)
Clinically she has greatly improved. Completed abx treatment. Not longer requiring supplemental O2. CXR can be repeated in 4-6 weeks, before if needed.

## 2023-10-17 NOTE — Assessment & Plan Note (Addendum)
 Left worse than right. Differential Dx is cellulitis, did not improve with IV abx and recently completed Doxycycline . Recommend another course of Doxycycline  100 mg bid x 7d, some side effects discussed. LE elevation. Keep area clean with soap and water . Avoid scratching, apply Triamcinolone  cream , small amount, bid prn. She would like to establish with outpt wound clinic. Instructed about warning signs.

## 2023-10-17 NOTE — Assessment & Plan Note (Signed)
She is not longer on supplemental O2 and DOE has greatly improved.

## 2023-10-26 ENCOUNTER — Other Ambulatory Visit: Payer: Self-pay | Admitting: Family Medicine

## 2023-10-26 DIAGNOSIS — G8929 Other chronic pain: Secondary | ICD-10-CM

## 2023-11-09 ENCOUNTER — Ambulatory Visit: Payer: No Typology Code available for payment source | Admitting: Obstetrics and Gynecology

## 2023-11-13 ENCOUNTER — Ambulatory Visit (INDEPENDENT_AMBULATORY_CARE_PROVIDER_SITE_OTHER): Payer: Medicare Other | Admitting: Family Medicine

## 2023-11-13 VITALS — BP 128/80 | HR 88 | Temp 98.0°F | Resp 16 | Ht 64.0 in

## 2023-11-13 DIAGNOSIS — I10 Essential (primary) hypertension: Secondary | ICD-10-CM | POA: Diagnosis not present

## 2023-11-13 DIAGNOSIS — I872 Venous insufficiency (chronic) (peripheral): Secondary | ICD-10-CM

## 2023-11-13 DIAGNOSIS — N1831 Chronic kidney disease, stage 3a: Secondary | ICD-10-CM

## 2023-11-13 DIAGNOSIS — R600 Localized edema: Secondary | ICD-10-CM

## 2023-11-13 LAB — CBC
HCT: 37.4 % (ref 36.0–46.0)
Hemoglobin: 12 g/dL (ref 12.0–15.0)
MCHC: 32.1 g/dL (ref 30.0–36.0)
MCV: 83.4 fL (ref 78.0–100.0)
Platelets: 270 10*3/uL (ref 150.0–400.0)
RBC: 4.49 Mil/uL (ref 3.87–5.11)
RDW: 17.1 % — ABNORMAL HIGH (ref 11.5–15.5)
WBC: 9.8 10*3/uL (ref 4.0–10.5)

## 2023-11-13 LAB — BASIC METABOLIC PANEL
BUN: 52 mg/dL — ABNORMAL HIGH (ref 6–23)
CO2: 27 meq/L (ref 19–32)
Calcium: 9.2 mg/dL (ref 8.4–10.5)
Chloride: 105 meq/L (ref 96–112)
Creatinine, Ser: 1.51 mg/dL — ABNORMAL HIGH (ref 0.40–1.20)
GFR: 33.59 mL/min — ABNORMAL LOW (ref >60.00)
Glucose, Bld: 98 mg/dL (ref 70–99)
Potassium: 4.1 meq/L (ref 3.5–5.1)
Sodium: 138 meq/L (ref 135–145)

## 2023-11-13 MED ORDER — CHLORTHALIDONE 25 MG PO TABS: 25.0000 mg | ORAL_TABLET | Freq: Every day | ORAL | 0 refills | Status: DC

## 2023-11-13 MED ORDER — DOXYCYCLINE HYCLATE 100 MG PO TABS: 100.0000 mg | ORAL_TABLET | Freq: Two times a day (BID) | ORAL | 0 refills | Status: AC

## 2023-11-13 NOTE — Progress Notes (Signed)
ACUTE VISIT Chief Complaint  Patient presents with   Leg Swelling   HPI: TracyNitika L [name] is a 78 y.o. female with a PMHx significant for HTN, CAD, chronic heart failure with preserved ejection fraction, COPD, OA, CKD III, HLD, and chronic pain, who is here today with her husband complaining of increased swelling and redness in both of her legs.  She endorses associated pruritus and burning sensation.   Has also been having chills, not sure about fever.  She has been taking 2 tablets of furosemide 20 mg every other day. Also using OTC Biofreeze, which has helped with the burning sensation somewhat.  She tries to elevate her legs as often as she can but difficult to do so.   She says her symptoms are worse at night and are interfering with her sleep. She is sleeping on the couch because she cannot get upstairs to get to her bed.  Negative for orthopnea or PND.  HTN:  Currently on amlodipine 5 mg daily, losartan 100 mg daily, metoprolol succinate 100 mg daily, and spironolactone 12.5 mg daily.  Lab Results  Component Value Date   NA 141 10/16/2023   CL 105 10/16/2023   K 4.4 10/16/2023   CO2 28 10/16/2023   BUN 23 10/16/2023   CREATININE 1.09 10/16/2023   GFR 49.69 (L) 10/16/2023   CALCIUM 9.1 10/16/2023   ALBUMIN 3.0 (L) 10/11/2022   GLUCOSE 96 10/16/2023   Review of Systems  Constitutional:  Positive for activity change. Negative for appetite change.  HENT:  Negative for mouth sores, nosebleeds and sore throat.   Respiratory:  Negative for cough and wheezing.   Gastrointestinal:  Negative for abdominal pain, nausea and vomiting.       Negative for changes in bowel habits.  Genitourinary:  Negative for decreased urine volume, dysuria and hematuria.  Musculoskeletal:  Positive for back pain and gait problem.  Skin:  Negative for wound.  Neurological:  Negative for syncope and facial asymmetry.  Psychiatric/Behavioral:  Negative for confusion and hallucinations.    See other pertinent positives and negatives in HPI.  Current Outpatient Medications on File Prior to Visit  Medication Sig Dispense Refill   acetaminophen (TYLENOL) 650 MG CR tablet Take 1,300 mg by mouth 2 (two) times daily as needed for pain.     allopurinol (ZYLOPRIM) 100 MG tablet TAKE 1 TABLET BY MOUTH EVERY DAY 90 tablet 2   clopidogrel (PLAVIX) 75 MG tablet TAKE 1 TABLET BY MOUTH EVERY DAY WITH BREAKFAST 90 tablet 2   estradiol (ESTRACE VAGINAL) 0.1 MG/GM vaginal cream Apply a pea sized amount just inside the vagina nightly for 2 weeks, then twice weekly thereafter (Patient taking differently: Place 1 Applicatorful vaginally at bedtime. Apply a pea sized amount just inside the vagina nightly for 2 weeks, then twice weekly thereafter) 42.5 g 12   ezetimibe (ZETIA) 10 MG tablet Take 1 tablet (10 mg total) by mouth daily. 90 tablet 3   furosemide (LASIX) 20 MG tablet Take 1 tablet (20 mg total) by mouth daily as needed. 30 tablet 2   isosorbide mononitrate (IMDUR) 30 MG 24 hr tablet TAKE 1 TABLET BY MOUTH EVERY DAY 90 tablet 3   losartan (COZAAR) 100 MG tablet Take 1 tablet (100 mg total) by mouth daily. 90 tablet 3   metoprolol succinate (TOPROL-XL) 100 MG 24 hr tablet TAKE 1 TABLET BY MOUTH EVERY DAY WITH OR IMMEDIATELY FOLLOWING A MEAL 90 tablet 3   nitroGLYCERIN (NITROSTAT) 0.4 MG SL  tablet PLACE 1 TABLET UNDER THE TONGUE EVERY 5 MINUTES AS NEEDED FOR CHEST PAIN 25 tablet 6   pantoprazole (PROTONIX) 40 MG tablet TAKE 1 TABLET BY MOUTH EVERY DAY 90 tablet 2   pravastatin (PRAVACHOL) 40 MG tablet TAKE 1 TABLET BY MOUTH EVERY DAY IN THE EVENING 90 tablet 3   spironolactone (ALDACTONE) 25 MG tablet Take 0.5 tablets (12.5 mg total) by mouth daily.     traMADol (ULTRAM) 50 MG tablet Take 0.5 tablets (25 mg total) by mouth every 12 (twelve) hours as needed. 30 tablet 1   triamcinolone cream (KENALOG) 0.1 % Apply topically daily as needed (for leg dermatitis.). 30 g 1   No current  facility-administered medications on file prior to visit.    Past Medical History:  Diagnosis Date   Allergic rhinitis    Anxiety    Arthritis    Ascending aorta dilation (HCC) 09/13/2022   Echo 09/12/2022: EF 50-55, GR 1 DD, mildly reduced RVSF, normal PASP (RVSP 17.7), mild dilation of ascending aorta (38 mm), RAP 3   Breast cancer (HCC) 04/28/2020   rigtht breast   Bronchitis    CAD (coronary artery disease)    1/19 PCI/DES to pLCX for ISR, normal EF.    Cataract    Cervical dysplasia    unsure of procedure, possible "burning" in her late 33s   CHF (congestive heart failure) (HCC)    Echo 06/2019: EF 55-60, elevated LVEDP, normal RV SF, mild MAC, mild MR, trivial TR   Complication of anesthesia    COPD (chronic obstructive pulmonary disease) (HCC)    early    Depression    GERD (gastroesophageal reflux disease)    Gout    Hyperlipidemia    Hypertension    Low back pain    Overactive bladder    PONV (postoperative nausea and vomiting)    Sleep apnea    Allergies  Allergen Reactions   Ak-Mycin [Erythromycin] Rash   Sulfamethoxazole Rash    Social History   Socioeconomic History   Marital status: Married    Spouse name: Not on file   Number of children: 2   Years of education: Not on file   Highest education level: 12th grade  Occupational History   Occupation: retired   Occupation: retired    Comment: Production designer, theatre/television/film  Tobacco Use   Smoking status: Former    Current packs/day: 0.00    Average packs/day: 1.3 packs/day for 52.0 years (65.0 ttl pk-yrs)    Types: Cigarettes    Start date: 1966    Quit date: 2018    Years since quitting: 7.0   Smokeless tobacco: Never   Tobacco comments:    completely quit May of 2018; period of years she did not smoke   Vaping Use   Vaping status: Never Used  Substance and Sexual Activity   Alcohol use: Yes    Comment: seldom   Drug use: No   Sexual activity: Not Currently  Other Topics Concern   Not on file  Social  History Narrative   Not on file   Social Drivers of Health   Financial Resource Strain: Medium Risk (11/13/2023)   Overall Financial Resource Strain (CARDIA)    Difficulty of Paying Living Expenses: Somewhat hard  Food Insecurity: Patient Declined (11/13/2023)   Hunger Vital Sign    Worried About Running Out of Food in the Last Year: Patient declined    Ran Out of Food in the Last Year: Patient declined  Transportation  Needs: No Transportation Needs (11/13/2023)   PRAPARE - Administrator, Civil Service (Medical): No    Lack of Transportation (Non-Medical): No  Physical Activity: Unknown (11/13/2023)   Exercise Vital Sign    Days of Exercise per Week: 0 days    Minutes of Exercise per Session: Not on file  Stress: Stress Concern Present (11/13/2023)   [employer] of Occupational Health - Occupational Stress Questionnaire    Feeling of Stress : To some extent  Social Connections: Unknown (11/13/2023)   Social Connection and Isolation Panel [NHANES]    Frequency of Communication with Friends and Family: Three times a week    Frequency of Social Gatherings with Friends and Family: Patient declined    Attends Religious Services: Patient declined    Active Member of Clubs or Organizations: No    Attends Engineer, structural: Not on file    Marital Status: Married    Vitals:   11/13/23 1356  BP: 128/80  Pulse: 88  Resp: 16  Temp: 98 F (36.7 C)  SpO2: 91%   Body mass index is 37.69 kg/m.  Physical Exam Vitals and nursing note reviewed.  Constitutional:      General: She is not in acute distress.    Appearance: She is well-developed.  HENT:     Head: Normocephalic and atraumatic.  Eyes:     Conjunctiva/sclera: Conjunctivae normal.  Cardiovascular:     Rate and Rhythm: Normal rate and regular rhythm.     Heart sounds: No murmur heard.    Comments: Difficulty findings pulses due to edema. Normal capillary refill.  Pulmonary:     Effort: Pulmonary  effort is normal. No respiratory distress.     Breath sounds: Normal breath sounds.  Musculoskeletal:     Right lower leg: 3+ Pitting Edema present.     Left lower leg: 3+ Pitting Edema present.  Skin:    General: Skin is warm.     Findings: Erythema present. No rash.     Comments: Bilateral LE edema, pretibial mainly. Thick crusty area left pretibial, no wounds. Hypertrophic toe nails. See picture.  Neurological:     General: No focal deficit present.     Mental Status: She is alert and oriented to person, place, and time.     Comments: In a wheel chair today.  Psychiatric:     Comments: Well groomed, good eye contact.     ASSESSMENT AND PLAN:  Tracy [name] was seen today for swelling and redness in her legs.   Lab Results  Component Value Date   NA 138 11/13/2023   CL 105 11/13/2023   K 4.1 11/13/2023   CO2 27 11/13/2023   BUN 52 (H) 11/13/2023   CREATININE 1.51 (H) 11/13/2023   GFR 33.59 (L) 11/13/2023   CALCIUM 9.2 11/13/2023   ALBUMIN 3.0 (L) 10/11/2022   GLUCOSE 98 11/13/2023   Lab Results  Component Value Date   WBC 9.8 11/13/2023   HGB 12.0 11/13/2023   HCT 37.4 11/13/2023   MCV 83.4 11/13/2023   PLT 270.0 11/13/2023    Stage 3a chronic kidney disease (HCC) Assessment & Plan: Creatinine and e GFR back to her baseline. Missed appt with nephrology due to hospitalization. As far as e GFR and cr remain stable, we can continue following here.   Venous stasis dermatitis of both lower extremities Assessment & Plan: She is concerned about infection process.  Recommend another course of Doxycycline 100 mg bid x 7d,  Side effects discussed, she has been on frequent abx use for the apst couple months. No significant improvement with abx, even during hospitalization,when she was on IV abx in 09/2023. LE elevation, walking a few times during the day, and appropriate skin care to continue. Keep area clean with soap and water. Keep appt with wound  clinic. Instructed about warning signs.  Orders: -     Doxycycline Hyclate; Take 1 tablet (100 mg total) by mouth 2 (two) times daily for 7 days.  Dispense: 14 tablet; Refill: 0  Bilateral lower extremity edema Assessment & Plan: Chlorthalidone 25 mg added today. Furosemide 40 mg daily for 7 days then once per day. Lower extremity elevation above heart level. Compression stocking may be difficult to put on but will also help with controlling edema.  Orders: -     Basic metabolic panel; Future -     CBC; Future -     Chlorthalidone; Take 1 tablet (25 mg total) by mouth daily.  Dispense: 90 tablet; Refill: 0  Essential hypertension Assessment & Plan: BP otherwise adequately controlled. Amlodipine could be aggravated LE edema, so discontinued. Chlorthalidone 25 mg daily started today, we discussed some side effects. Continue losartan 100 mg daily and spironolactone 25 mg daily. Continue low-salt diet. Monitor BP regularly. Follow-up in 6 weeks, before if needed.  Orders: -     Basic metabolic panel; Future -     CBC; Future -     Chlorthalidone; Take 1 tablet (25 mg total) by mouth daily.  Dispense: 90 tablet; Refill: 0  Return in about 6 weeks (around 12/25/2023) for chronic problems.  I, Rolla Etienne Wierda, acting as a scribe for Tauheedah Bok [place], MD., have documented all relevant documentation on the behalf of Jheremy Boger [place], MD, as directed by  Luiza Carranco [place], MD while in the presence of Tallie Dodds [place], MD.   I, Gram Siedlecki [place], MD, have reviewed all documentation for this visit. The documentation on 11/13/23 for the exam, diagnosis, procedures, and orders are all accurate and complete.  Aava Deland G. [place], MD  [facility]. [place] office.

## 2023-11-13 NOTE — Patient Instructions (Addendum)
A few things to remember from today's visit:  Bilateral lower extremity edema - Plan: Basic metabolic panel, CBC, chlorthalidone (HYGROTON) 25 MG tablet  Essential hypertension - Plan: Basic metabolic panel, CBC, chlorthalidone (HYGROTON) 25 MG tablet  Venous stasis dermatitis of both lower extremities  Stage 3a chronic kidney disease (HCC), Chronic  Legs elevation above heart level a few times during the day for 5 min. Walk for 2-5 min every 2-3 hours and increase time as tolerated. Keep appt with wound clinic. Amlodipine discontinued. Chlorthalidone started. Furosemide 2 tabs daily for 1 week then daily. Monitor blood pressure.  If you need refills for medications you take chronically, please call your pharmacy. Do not use My Chart to request refills or for acute issues that need immediate attention. If you send a my chart message, it may take a few days to be addressed, specially if I am not in the office.  Please be sure medication list is accurate. If a new problem present, please set up appointment sooner than planned today.

## 2023-11-13 NOTE — Assessment & Plan Note (Signed)
Creatinine and e GFR back to her baseline. Missed appt with nephrology due to hospitalization. As far as e GFR and cr remain stable, we can continue following here.

## 2023-11-13 NOTE — Assessment & Plan Note (Signed)
She is concerned about infection process.  Recommend another course of Doxycycline 100 mg bid x 7d,  LE elevation, walking a few times during the day, and appropriate skin care to continue. Keep area clean with soap and water. Keep appt with wound clinic. Instructed about warning signs.

## 2023-11-13 NOTE — Assessment & Plan Note (Signed)
BP otherwise adequately controlled. Amlodipine could be aggravated LE edema, so discontinued. Chlorthalidone 25 mg daily started today, we discussed some side effects. Continue losartan 100 mg daily and spironolactone 25 mg daily. Continue low-salt diet. Monitor BP regularly. Follow-up in 6 weeks, before if needed.

## 2023-11-13 NOTE — Assessment & Plan Note (Signed)
Chlorthalidone 25 mg added today. Furosemide 40 mg daily for 7 days then once per day. Lower extremity elevation above heart level. Compression stocking may be difficult to put on but will also help with controlling edema.

## 2023-11-14 ENCOUNTER — Encounter: Payer: Self-pay | Admitting: Family Medicine

## 2023-11-20 ENCOUNTER — Ambulatory Visit: Payer: No Typology Code available for payment source | Admitting: Family Medicine

## 2023-11-22 ENCOUNTER — Other Ambulatory Visit: Payer: Self-pay | Admitting: Family Medicine

## 2023-11-22 DIAGNOSIS — I872 Venous insufficiency (chronic) (peripheral): Secondary | ICD-10-CM

## 2023-11-30 ENCOUNTER — Ambulatory Visit: Payer: Self-pay | Admitting: Family Medicine

## 2023-11-30 NOTE — Telephone Encounter (Signed)
Chief Complaint: Left leg edema, right leg mild Symptoms: burning, itching, swelling, painful to bear weight Frequency: Ongoing was seen 01/27 for same problem Pertinent Negatives: Patient denies fever Disposition: [] ED /[] Urgent Care (no appt availability in office) / [x] Appointment(In office/virtual)/ []  [facility] [facility]/ [] Home Care/ [] Refused Recommended Disposition /[] [place]/ []  Follow-up with PCP Additional Notes: Pt reports she has been experiencing edema in the left left leg for quite some time, she was seen in office on 01/27 and treated with abx. Pt notes the swelling, redness and pain has not improved and is sometimes worse. Pt denies fever. Pt report she has doubled her Lasix to 40 mg with no improvement. Reports DVT has been ruled out. OV scheduled for tomorrow. This RN educated pt on home care, new-worsening symptoms, when to call back/seek emergent care. Pt verbalized understanding and agrees to plan.    Copied from CRM [id number]. Topic: Clinical - Red Word Triage >> Nov 30, 2023  1:39 PM Almira Coaster wrote: Red Word that prompted transfer to Nurse Triage:Patient was seen on 11/13/2023 due to swelling and redness on the left leg, Patient states  leg is still red, difficulty applying pressure on it and has a sore on the leg. Reason for Disposition  [1] MODERATE leg swelling (e.g., swelling extends up to knees) AND [2] new-onset or worsening  Answer Assessment - Initial Assessment Questions 1. ONSET: "When did the swelling start?" (e.g., minutes, hours, days)     Ongoing, seen in office 01/27 for the same issue 2. LOCATION: "What part of the leg is swollen?"  "Are both legs swollen or just one leg?"     Left leg, mild in right leg 3. SEVERITY: "How bad is the swelling?" (e.g., localized; mild, moderate, severe)   - Localized: Small area of swelling localized to one leg.   - MILD pedal edema: Swelling limited to foot and ankle, pitting edema < 1/4 inch (6 mm) deep,  rest and elevation eliminate most or all swelling.   - MODERATE edema: Swelling of lower leg to knee, pitting edema > 1/4 inch (6 mm) deep, rest and elevation only partially reduce swelling.   - SEVERE edema: Swelling extends above knee, facial or hand swelling present.      Ankle, foot 4. REDNESS: "Does the swelling look red or infected?"     Yes 5. PAIN: "Is the swelling painful to touch?" If Yes, ask: "How painful is it?"   (Scale 1-10; mild, moderate or severe)     9/10 6. FEVER: "Do you have a fever?" If Yes, ask: "What is it, how was it measured, and when did it start?"      None 7. CAUSE: "What do you think is causing the leg swelling?"     Pt concerned about potential infection  9. RECURRENT SYMPTOM: "Have you had leg swelling before?" If Yes, ask: "When was the last time?" "What happened that time?"     Yes 10. OTHER SYMPTOMS: "Do you have any other symptoms?" (e.g., chest pain, difficulty breathing)       Burning, itching, pain, difficulty bearing weight, warmth  Protocols used: Leg Swelling and Edema-A-AH

## 2023-12-01 ENCOUNTER — Encounter: Payer: Self-pay | Admitting: Family Medicine

## 2023-12-01 ENCOUNTER — Ambulatory Visit: Payer: No Typology Code available for payment source | Admitting: Family Medicine

## 2023-12-01 VITALS — BP 120/68 | HR 82 | Resp 16 | Ht 64.0 in

## 2023-12-01 DIAGNOSIS — R079 Chest pain, unspecified: Secondary | ICD-10-CM | POA: Diagnosis not present

## 2023-12-01 DIAGNOSIS — I872 Venous insufficiency (chronic) (peripheral): Secondary | ICD-10-CM | POA: Diagnosis not present

## 2023-12-01 NOTE — Progress Notes (Signed)
ACUTE VISIT Chief Complaint  Patient presents with   Leg Swelling   HPI: [name] is a 79 y.o. female with a PMHx significant for HTN, CAD, chronic heart failure with preserved ejection fraction, COPD, OA, CKD III, HLD, and chronic pain, who is here today complaining of bilateral LE edema and erythema.  She was seen on 11/13/23 and at that time Doxycycline was started to cover for possible cellulitis. Patient states her leg swelling and pretibial pain is not improving. She says she has been trying to get up to walk every hour, as well as elevating her feet, but it hasn't helped. She also says she is having significant difficulty walking, which increase pain, specially feet. She has not noted ulcers or cyanosis.   She also says she is not sleeping well, having some chills, and having some leg pruritus.   Also complaining of cramps in her feet and neck.  She does not want to wear compression stockings.   The pain is interfering with her sleep,skin is sensitive to touch.  She has Tramadol  50 mg to take for back pain, sometimes she take 1/2 tab (25 mg" but not frequently.   At her last visit, she was taken off amlodipine and put on chlorthalidone 25 mg daily.  She is also on Furosemide 20 mg daily and take Spironolactone 12.5 mg daily.  Pertinent negatives include fever, decreased urinary output, or gross hematuria.   Lab Results  Component Value Date   NA 138 11/13/2023   CL 105 11/13/2023   K 4.1 11/13/2023   CO2 27 11/13/2023   BUN 52 (H) 11/13/2023   CREATININE 1.51 (H) 11/13/2023   GFR 33.59 (L) 11/13/2023   CALCIUM 9.2 11/13/2023   ALBUMIN 3.0 (L) 10/11/2022   GLUCOSE 98 11/13/2023   She has an appointment with the wound clinic on 2/17.   Patient also mentions she had a small sharp left-sided chest pain last week. She rates the pain as a 5-6/10.She was seated when pain started. She took an expired nitroglycerin tablet, which helped.  Denies any radiation  to her neck or arm, palpitations, SOB, or diaphoresis.  She doesn't have a follow up with cardiology scheduled yet.   She also complains her tremor has been worse lately.  She has seen neurologist for this problem.She is not interested in seeing provider at this time.   Review of Systems  Constitutional:  Positive for fatigue. Negative for appetite change, chills and fever.  HENT:  Negative for mouth sores and sore throat.   Respiratory:  Negative for cough.   Cardiovascular:  Positive for leg swelling.  Gastrointestinal:  Negative for abdominal pain, nausea and vomiting.  Endocrine: Negative for cold intolerance and heat intolerance.  Genitourinary:  Negative for dysuria.  Musculoskeletal:  Positive for arthralgias, back pain and gait problem.  Skin:  Negative for wound.  Neurological:  Negative for syncope and headaches.  See other pertinent positives and negatives in HPI.  Current Outpatient Medications on File Prior to Visit  Medication Sig Dispense Refill   acetaminophen (TYLENOL) 650 MG CR tablet Take 1,300 mg by mouth 2 (two) times daily as needed for pain.     allopurinol (ZYLOPRIM) 100 MG tablet TAKE 1 TABLET BY MOUTH EVERY DAY 90 tablet 2   chlorthalidone (HYGROTON) 25 MG tablet Take 1 tablet (25 mg total) by mouth daily. 90 tablet 0   clopidogrel (PLAVIX) 75 MG tablet TAKE 1 TABLET BY MOUTH EVERY DAY WITH BREAKFAST  90 tablet 2   estradiol (ESTRACE VAGINAL) 0.1 MG/GM vaginal cream Apply a pea sized amount just inside the vagina nightly for 2 weeks, then twice weekly thereafter (Patient taking differently: Place 1 Applicatorful vaginally at bedtime. Apply a pea sized amount just inside the vagina nightly for 2 weeks, then twice weekly thereafter) 42.5 g 12   ezetimibe (ZETIA) 10 MG tablet Take 1 tablet (10 mg total) by mouth daily. 90 tablet 3   furosemide (LASIX) 20 MG tablet TAKE 1 TABLET BY MOUTH EVERY DAY AS NEEDED 90 tablet 2   isosorbide mononitrate (IMDUR) 30 MG 24 hr  tablet TAKE 1 TABLET BY MOUTH EVERY DAY 90 tablet 3   losartan (COZAAR) 100 MG tablet Take 1 tablet (100 mg total) by mouth daily. 90 tablet 3   metoprolol succinate (TOPROL-XL) 100 MG 24 hr tablet TAKE 1 TABLET BY MOUTH EVERY DAY WITH OR IMMEDIATELY FOLLOWING A MEAL 90 tablet 3   nitroGLYCERIN (NITROSTAT) 0.4 MG SL tablet PLACE 1 TABLET UNDER THE TONGUE EVERY 5 MINUTES AS NEEDED FOR CHEST PAIN 25 tablet 6   pantoprazole (PROTONIX) 40 MG tablet TAKE 1 TABLET BY MOUTH EVERY DAY 90 tablet 2   pravastatin (PRAVACHOL) 40 MG tablet TAKE 1 TABLET BY MOUTH EVERY DAY IN THE EVENING 90 tablet 3   spironolactone (ALDACTONE) 25 MG tablet Take 0.5 tablets (12.5 mg total) by mouth daily.     traMADol (ULTRAM) 50 MG tablet Take 0.5 tablets (25 mg total) by mouth every 12 (twelve) hours as needed. 30 tablet 1   triamcinolone cream (KENALOG) 0.1 % Apply topically daily as needed (for leg dermatitis.). 30 g 1   No current facility-administered medications on file prior to visit.    Past Medical History:  Diagnosis Date   Allergic rhinitis    Anxiety    Arthritis    Ascending aorta dilation (HCC) 09/13/2022   Echo 09/12/2022: EF 50-55, GR 1 DD, mildly reduced RVSF, normal PASP (RVSP 17.7), mild dilation of ascending aorta (38 mm), RAP 3   Breast cancer (HCC) 04/28/2020   rigtht breast   Bronchitis    CAD (coronary artery disease)    1/19 PCI/DES to pLCX for ISR, normal EF.    Cataract    Cervical dysplasia    unsure of procedure, possible "burning" in her late 59s   CHF (congestive heart failure) (HCC)    Echo 06/2019: EF 55-60, elevated LVEDP, normal RV SF, mild MAC, mild MR, trivial TR   Complication of anesthesia    COPD (chronic obstructive pulmonary disease) (HCC)    early    Depression    GERD (gastroesophageal reflux disease)    Gout    Hyperlipidemia    Hypertension    Low back pain    Overactive bladder    PONV (postoperative nausea and vomiting)    Sleep apnea    Allergies   Allergen Reactions   Ak-Mycin [Erythromycin] Rash   Sulfamethoxazole Rash    Social History   Socioeconomic History   Marital status: Married    Spouse name: Not on file   Number of children: 2   Years of education: Not on file   Highest education level: 12th grade  Occupational History   Occupation: retired   Occupation: retired    Comment: Production designer, theatre/television/film  Tobacco Use   Smoking status: Former    Current packs/day: 0.00    Average packs/day: 1.3 packs/day for 52.0 years (65.0 ttl pk-yrs)    Types: Cigarettes  Start date: 1966    Quit date: 2018    Years since quitting: 7.1   Smokeless tobacco: Never   Tobacco comments:    completely quit May of 2018; period of years she did not smoke   Vaping Use   Vaping status: Never Used  Substance and Sexual Activity   Alcohol use: Yes    Comment: seldom   Drug use: No   Sexual activity: Not Currently  Other Topics Concern   Not on file  Social History Narrative   Not on file   Social Drivers of Health   Financial Resource Strain: Medium Risk (11/13/2023)   Overall Financial Resource Strain (CARDIA)    Difficulty of Paying Living Expenses: Somewhat hard  Food Insecurity: Patient Declined (11/13/2023)   Hunger Vital Sign    Worried About Running Out of Food in the Last Year: Patient declined    Ran Out of Food in the Last Year: Patient declined  Transportation Needs: No Transportation Needs (11/13/2023)   PRAPARE - Administrator, Civil Service (Medical): No    Lack of Transportation (Non-Medical): No  Physical Activity: Unknown (11/13/2023)   Exercise Vital Sign    Days of Exercise per Week: 0 days    Minutes of Exercise per Session: Not on file  Stress: Stress Concern Present (11/13/2023)   [employer] of Occupational Health - Occupational Stress Questionnaire    Feeling of Stress : To some extent  Social Connections: Unknown (11/13/2023)   Social Connection and Isolation Panel [NHANES]    Frequency of  Communication with Friends and Family: Three times a week    Frequency of Social Gatherings with Friends and Family: Patient declined    Attends Religious Services: Patient declined    Active Member of Clubs or Organizations: No    Attends Banker Meetings: Not on file    Marital Status: Married    Vitals:   12/01/23 1355  BP: 120/68  Pulse: 82  Resp: 16  SpO2: 99%   Body mass index is 37.69 kg/m.  Physical Exam Vitals and nursing note reviewed.  Constitutional:      General: She is not in acute distress.    Appearance: She is well-developed.  HENT:     Head: Normocephalic and atraumatic.     Mouth/Throat:     Pharynx: Uvula midline.  Eyes:     Conjunctiva/sclera: Conjunctivae normal.  Cardiovascular:     Rate and Rhythm: Normal rate and regular rhythm.     Heart sounds: No murmur heard.    Comments: RLE 2+ and LLE 3+ pitting edema. No calves pain with foot dorsiflexion. Had some difficulty finding left DP pulse but both palpable, normal capillary refill bilateral. Pulmonary:     Effort: Pulmonary effort is normal. No respiratory distress.     Breath sounds: Normal breath sounds.  Abdominal:     Palpations: There is no mass.  Musculoskeletal:     Right lower leg: Edema present.     Left lower leg: Edema present.  Skin:    General: Skin is warm.     Findings: Erythema present. No rash.     Comments: Bilateral LE edema, L>R. Erythema, mainly pretibial and pedal, right one has greatly improved. Crusty area on pretibial ial aspect of left LE. No wounds or exudate. Hypertrophic toe nails.  Neurological:     General: No focal deficit present.     Mental Status: She is alert and oriented to person, place, and  time.     Comments: In a wheel chair today, she had a cane with her.  Psychiatric:        Mood and Affect: Affect normal. Mood is anxious.    ASSESSMENT AND PLAN:  Ms. Cragin was seen today for leg swelling.   Venous stasis dermatitis of both  lower extremities Assessment & Plan: We reviewed diagnosis. He seems like problem has mildly improved since her last visit, especially her right lower extremity. She has tried antibiotic treatment a few times with no improvement, I do not think there is not a current infection process, so I am not recommending repeating antibiotic treatment, we discussed some side effects. Continue lower extremity elevation and frequent ambulation throughout the day. She does not want to wear compression stockings. Continue appropriate skin care. She has an appointment with wound clinic this coming Monday. Clearly instructed about warning signs.   Chest pain, unspecified type She is reporting one single episode while she was at rest. No associated symptoms. She has history of CAD. Right and left heart cath with coronary angiography in 09/2020: Right dominant coronary anatomy with no noted obstructive disease in the RCA.Large tortuous and angulated LAD with segmental 50 to 60% proximal to mid narrowing and tandem mid to distal 70% stenoses after the large first diagonal.  The diagonal contains 50% proximal narrowing.  The vessel is tortuous and heavily calcified. Circumflex contains a long, possibly overlapping stented segment that is widely patent.  The vessel is heavily calcified. Mid range LVEF estimated at 50% with EDP 14 m mercury. Mild pulmonary hypertension with mean pressure 24 mmHg. She is not sure about next appt with her cardiologist. She was clearly instructed about warning signs.  I spent a total of 36 minutes in both face to face and non face to face activities for this visit on the date of this encounter. During this time history was obtained and documented, examination was performed, prior labs/imaging reviewed, and assessment/plan discussed.  Return if symptoms worsen or fail to improve, for keep next appointment.  I, Rolla Etienne Wierda, acting as a scribe for Kiona Blume [place], MD., have  documented all relevant documentation on the behalf of Tracy Dezarn [place], MD, as directed by  Tracy Morioka [place], MD while in the presence of Tracy Hawes [place], MD.   I, Celia Friedland [place], MD, have reviewed all documentation for this visit. The documentation on 12/01/23 for the exam, diagnosis, procedures, and orders are all accurate and complete.  Seibert Keeter G. [place], MD  [facility]. [place] office.

## 2023-12-01 NOTE — Assessment & Plan Note (Addendum)
We reviewed diagnosis. He seems like problem has mildly improved since her last visit, especially her right lower extremity. She has tried antibiotic treatment a few times with no improvement, I do not think there is not a current infection process, so I am not recommending repeating antibiotic treatment, we discussed some side effects. Continue lower extremity elevation and frequent ambulation throughout the day. She does not want to wear compression stockings. Continue appropriate skin care. She has an appointment with wound clinic this coming Monday. Clearly instructed about warning signs.

## 2023-12-01 NOTE — Patient Instructions (Addendum)
A few things to remember from today's visit:  Venous stasis dermatitis of both lower extremities  Chest pain, unspecified type Monitor for fever. Continue lower extremity elevation and good skin care. If palpitations, shortness or breath, or chest pain seek immediate medical attention.  Walk for 5 min every 1-2 hours.  If you need refills for medications you take chronically, please call your pharmacy. Do not use My Chart to request refills or for acute issues that need immediate attention. If you send a my chart message, it may take a few days to be addressed, specially if I am not in the office.  Please be sure medication list is accurate. If a new problem present, please set up appointment sooner than planned today.

## 2023-12-04 ENCOUNTER — Encounter (HOSPITAL_BASED_OUTPATIENT_CLINIC_OR_DEPARTMENT_OTHER): Payer: Medicare Other | Attending: Internal Medicine | Admitting: Internal Medicine

## 2023-12-04 DIAGNOSIS — I110 Hypertensive heart disease with heart failure: Secondary | ICD-10-CM | POA: Insufficient documentation

## 2023-12-04 DIAGNOSIS — I890 Lymphedema, not elsewhere classified: Secondary | ICD-10-CM | POA: Insufficient documentation

## 2023-12-04 DIAGNOSIS — J449 Chronic obstructive pulmonary disease, unspecified: Secondary | ICD-10-CM | POA: Diagnosis not present

## 2023-12-04 DIAGNOSIS — I509 Heart failure, unspecified: Secondary | ICD-10-CM | POA: Diagnosis not present

## 2023-12-04 DIAGNOSIS — I2510 Atherosclerotic heart disease of native coronary artery without angina pectoris: Secondary | ICD-10-CM | POA: Diagnosis not present

## 2023-12-04 DIAGNOSIS — I739 Peripheral vascular disease, unspecified: Secondary | ICD-10-CM | POA: Insufficient documentation

## 2023-12-04 DIAGNOSIS — I252 Old myocardial infarction: Secondary | ICD-10-CM | POA: Diagnosis not present

## 2023-12-05 ENCOUNTER — Telehealth: Payer: Self-pay

## 2023-12-05 ENCOUNTER — Other Ambulatory Visit: Payer: Self-pay

## 2023-12-05 DIAGNOSIS — Z122 Encounter for screening for malignant neoplasm of respiratory organs: Secondary | ICD-10-CM

## 2023-12-05 DIAGNOSIS — Z87891 Personal history of nicotine dependence: Secondary | ICD-10-CM

## 2023-12-05 NOTE — Telephone Encounter (Signed)
.  Lung Cancer Screening Narrative/Criteria Questionnaire (Cigarette Smokers Only- No Cigars/Pipes/vapes)   Tracy [name]   SDMV:12/20/2023 at 11:00 with Dorene Grebe        Mar 20, 1946   LDCT: 12/27/2023 at 11:40am at [facility]    78 y.o.   Phone: [phone]  Lung Screening Narrative (confirm age 43-77 yrs Medicare / 50-80 yrs Private pay insurance)   Insurance information:[employer]/Medicare   Referring Provider:Jordan, MD   This screening involves an initial phone call with a team member from our program. It is called a shared decision making visit. The initial meeting is required by  insurance and Medicare to make sure you understand the program. This appointment takes about 15-20 minutes to complete. You will complete the screening scan at your scheduled date/time.  This scan takes about 5-10 minutes to complete. You can eat and drink normally before and after the scan.  Criteria questions for Lung Cancer Screening:   Are you a current or former smoker? Former Age began smoking: 20   If you are a former smoker, what year did you quit smoking? 2018 (within 15 yrs)   To calculate your smoking history, I need an accurate estimate of how many packs of cigarettes you smoked per day and for how many years. (Not just the number of PPD you are now smoking)   Years smoking 57 x Packs per day 3 = Pack years 171   (at least 20 pack yrs)   (Make sure they understand that we need to know how much they have smoked in the past, not just the number of PPD they are smoking now)  Do you have a personal history of cancer?  No    Do you have a family history of cancer? Yes  (cancer type and and relative) Mother Colon  Are you coughing up blood?  No  Have you had unexplained weight loss of 15 lbs or more in the last 6 months? No  It looks like you meet all criteria.  When would be a good time for [place] to schedule you for this screening?   Additional information: N/A

## 2023-12-12 ENCOUNTER — Other Ambulatory Visit: Payer: Self-pay | Admitting: Physician Assistant

## 2023-12-12 DIAGNOSIS — I10 Essential (primary) hypertension: Secondary | ICD-10-CM

## 2023-12-18 ENCOUNTER — Encounter: Payer: Self-pay | Admitting: Family Medicine

## 2023-12-18 ENCOUNTER — Ambulatory Visit (INDEPENDENT_AMBULATORY_CARE_PROVIDER_SITE_OTHER): Payer: Medicare Other | Admitting: Family Medicine

## 2023-12-18 VITALS — BP 128/70 | HR 88 | Resp 16 | Ht 64.0 in

## 2023-12-18 DIAGNOSIS — I10 Essential (primary) hypertension: Secondary | ICD-10-CM | POA: Diagnosis not present

## 2023-12-18 DIAGNOSIS — I872 Venous insufficiency (chronic) (peripheral): Secondary | ICD-10-CM

## 2023-12-18 DIAGNOSIS — R300 Dysuria: Secondary | ICD-10-CM | POA: Diagnosis not present

## 2023-12-18 NOTE — Assessment & Plan Note (Signed)
 BP adequately controlled. Continue Chlorthalidone 25 mg daily, Losartan 100 mg daily, Metoprolol succinate 100 mg daily,and Indur 30 mg daily. Continue low salt diet.

## 2023-12-18 NOTE — Patient Instructions (Addendum)
 A few things to remember from today's visit:  Venous stasis dermatitis of both lower extremities  Essential hypertension - Plan: TSH No changes today.  If you need refills for medications you take chronically, please call your pharmacy. Do not use My Chart to request refills or for acute issues that need immediate attention. If you send a my chart message, it may take a few days to be addressed, specially if I am not in the office.  Please be sure medication list is accurate. If a new problem present, please set up appointment sooner than planned today.

## 2023-12-18 NOTE — Progress Notes (Signed)
 HPI: [name] is a 78 y.o. female with a PMHx significant for HTN, CAD, HFpEF, COPD, OA, CKD III, HLD, and chronic pain who is here today with her husband for chronic disease management.   Last seen on 12/01/2023  She was seen at the wound clinic on 12/04/23 for venous stasis dermatitis. No wounds or wiping at the time of evaluation.  Compression stocking recommended, declined lymphedema clinic evaluation, and she was instructed to follow as needed.   Since that visit with the wound clinic, she says she has developed a small sore areas on her right leg. She has been using neosporin and a dressing to cover it up.  She has also been trying to elevate her legs and walk as much as she can. She is drinking as much fluids as she can and trying to avoid salt in her diet.  LE pain is stable, it is worse at night when she is in bed, rubbing skin against blankets also irritate skin.  She had leg treatments during her last hospitalization and it really helped. She was hoping something similar was going to be recommended at the wound clinic.She called the hospital and what was used was Xeroform Gauze, which is OTC. She is having her daughter visiting in a few days and she is planning on helping her putting treatment on, she is a Publishing rights manager.  -Patient also reports she has been having increased urinary urgency and burning sensation while burning "for awhile."  Also endorses vaginal pruritus. Negative for pelvic pain, vaginal discharge, gross hematuria, or foam in urina.  -She is also requesting thyroid test. Negative for cold/heat intolerance or changes in bowel habits. Hx of tremor, she has been evaluated by neuro.  Lab Results  Component Value Date   TSH 1.71 04/20/2021   HTN on Chlorthalidone 25 mg daily, Losartan 100 mg daily, Imdur 30 mg daily, Spironolactone 25 mg daily, and Metoprolol Succinate 100 mg daily. Negative fro CP,SOB,palpitations,orthopnea,or PND. No  worsening LE edema.  Review of Systems  Constitutional:  Positive for fatigue. Negative for activity change, appetite change and fever.  HENT:  Negative for mouth sores, nosebleeds and trouble swallowing.   Eyes:  Negative for redness and visual disturbance.  Respiratory:  Negative for cough, shortness of breath and wheezing.   Cardiovascular:  Negative for chest pain, palpitations and leg swelling.  Gastrointestinal:  Negative for abdominal pain, nausea and vomiting.  Genitourinary:  Negative for flank pain.  Musculoskeletal:  Positive for arthralgias and gait problem.  Neurological:  Negative for syncope, weakness and headaches.  Psychiatric/Behavioral:  Negative for confusion and hallucinations.   See other pertinent positives and negatives in HPI.  Current Outpatient Medications on File Prior to Visit  Medication Sig Dispense Refill   acetaminophen (TYLENOL) 650 MG CR tablet Take 1,300 mg by mouth 2 (two) times daily as needed for pain.     allopurinol (ZYLOPRIM) 100 MG tablet TAKE 1 TABLET BY MOUTH EVERY DAY 90 tablet 2   chlorthalidone (HYGROTON) 25 MG tablet Take 1 tablet (25 mg total) by mouth daily. 90 tablet 0   clopidogrel (PLAVIX) 75 MG tablet TAKE 1 TABLET BY MOUTH EVERY DAY WITH BREAKFAST 90 tablet 2   estradiol (ESTRACE VAGINAL) 0.1 MG/GM vaginal cream Apply a pea sized amount just inside the vagina nightly for 2 weeks, then twice weekly thereafter (Patient taking differently: Place 1 Applicatorful vaginally at bedtime. Apply a pea sized amount just inside the vagina nightly for 2  weeks, then twice weekly thereafter) 42.5 g 12   ezetimibe (ZETIA) 10 MG tablet Take 1 tablet (10 mg total) by mouth daily. 90 tablet 3   furosemide (LASIX) 20 MG tablet TAKE 1 TABLET BY MOUTH EVERY DAY AS NEEDED 90 tablet 2   isosorbide mononitrate (IMDUR) 30 MG 24 hr tablet TAKE 1 TABLET BY MOUTH EVERY DAY 90 tablet 3   losartan (COZAAR) 100 MG tablet Take 1 tablet (100 mg total) by mouth daily. 90  tablet 3   metoprolol succinate (TOPROL-XL) 100 MG 24 hr tablet TAKE 1 TABLET BY MOUTH EVERY DAY WITH OR IMMEDIATELY FOLLOWING A MEAL 90 tablet 3   nitroGLYCERIN (NITROSTAT) 0.4 MG SL tablet PLACE 1 TABLET UNDER THE TONGUE EVERY 5 MINUTES AS NEEDED FOR CHEST PAIN 25 tablet 6   pantoprazole (PROTONIX) 40 MG tablet TAKE 1 TABLET BY MOUTH EVERY DAY 90 tablet 2   pravastatin (PRAVACHOL) 40 MG tablet TAKE 1 TABLET BY MOUTH EVERY DAY IN THE EVENING 90 tablet 3   spironolactone (ALDACTONE) 25 MG tablet TAKE 1/2 TABLET BY MOUTH EVERY DAY 45 tablet 1   traMADol (ULTRAM) 50 MG tablet Take 0.5 tablets (25 mg total) by mouth every 12 (twelve) hours as needed. 30 tablet 1   triamcinolone cream (KENALOG) 0.1 % Apply topically daily as needed (for leg dermatitis.). 30 g 1   No current facility-administered medications on file prior to visit.   Past Medical History:  Diagnosis Date   Allergic rhinitis    Anxiety    Arthritis    Ascending aorta dilation (HCC) 09/13/2022   Echo 09/12/2022: EF 50-55, GR 1 DD, mildly reduced RVSF, normal PASP (RVSP 17.7), mild dilation of ascending aorta (38 mm), RAP 3   Breast cancer (HCC) 04/28/2020   rigtht breast   Bronchitis    CAD (coronary artery disease)    1/19 PCI/DES to pLCX for ISR, normal EF.    Cataract    Cervical dysplasia    unsure of procedure, possible "burning" in her late 22s   CHF (congestive heart failure) (HCC)    Echo 06/2019: EF 55-60, elevated LVEDP, normal RV SF, mild MAC, mild MR, trivial TR   Complication of anesthesia    COPD (chronic obstructive pulmonary disease) (HCC)    early    Depression    GERD (gastroesophageal reflux disease)    Gout    Hyperlipidemia    Hypertension    Low back pain    Overactive bladder    PONV (postoperative nausea and vomiting)    Sleep apnea    Allergies  Allergen Reactions   Ak-Mycin [Erythromycin] Rash   Sulfamethoxazole Rash   Social History   Socioeconomic History   Marital status: Married     Spouse name: Not on file   Number of children: 2   Years of education: Not on file   Highest education level: 12th grade  Occupational History   Occupation: retired   Occupation: retired    Comment: Production designer, theatre/television/film  Tobacco Use   Smoking status: Former    Current packs/day: 0.00    Average packs/day: 1.3 packs/day for 52.0 years (65.0 ttl pk-yrs)    Types: Cigarettes    Start date: 1966    Quit date: 2018    Years since quitting: 7.1   Smokeless tobacco: Never   Tobacco comments:    completely quit May of 2018; period of years she did not smoke   Vaping Use   Vaping status: Never Used  Substance and Sexual Activity   Alcohol use: Yes    Comment: seldom   Drug use: No   Sexual activity: Not Currently  Other Topics Concern   Not on file  Social History Narrative   Not on file   Social Drivers of Health   Financial Resource Strain: Medium Risk (11/13/2023)   Overall Financial Resource Strain (CARDIA)    Difficulty of Paying Living Expenses: Somewhat hard  Food Insecurity: Patient Declined (11/13/2023)   Hunger Vital Sign    Worried About Running Out of Food in the Last Year: Patient declined    Ran Out of Food in the Last Year: Patient declined  Transportation Needs: No Transportation Needs (11/13/2023)   PRAPARE - Administrator, Civil Service (Medical): No    Lack of Transportation (Non-Medical): No  Physical Activity: Unknown (11/13/2023)   Exercise Vital Sign    Days of Exercise per Week: 0 days    Minutes of Exercise per Session: Not on file  Stress: Stress Concern Present (11/13/2023)   [employer] of Occupational Health - Occupational Stress Questionnaire    Feeling of Stress : To some extent  Social Connections: Unknown (11/13/2023)   Social Connection and Isolation Panel [NHANES]    Frequency of Communication with Friends and Family: Three times a week    Frequency of Social Gatherings with Friends and Family: Patient declined    Attends  Religious Services: Patient declined    Active Member of Clubs or Organizations: No    Attends Banker Meetings: Not on file    Marital Status: Married   Vitals:   12/18/23 1540  BP: 128/70  Pulse: 88  Resp: 16  SpO2: 94%   Body mass index is 37.69 kg/m.  Physical Exam Vitals and nursing note reviewed.  Constitutional:      General: She is not in acute distress.    Appearance: She is well-developed.  HENT:     Head: Normocephalic and atraumatic.  Eyes:     Conjunctiva/sclera: Conjunctivae normal.  Cardiovascular:     Rate and Rhythm: Normal rate and regular rhythm.     Pulses:          Dorsalis pedis pulses are 2+ on the right side and 2+ on the left side.     Heart sounds: No murmur heard.    Comments: Pedal pitting edema 1+, bilateral. LLE lymphedema Pulmonary:     Effort: Pulmonary effort is normal. No respiratory distress.     Breath sounds: Normal breath sounds.  Abdominal:     Palpations: Abdomen is soft. There is no mass.     Tenderness: There is no abdominal tenderness.  Musculoskeletal:     Right lower leg: Pitting Edema present.     Left lower leg: Pitting Edema present.  Skin:    General: Skin is warm.     Findings: No erythema or rash.  Neurological:     General: No focal deficit present.     Mental Status: She is alert and oriented to person, place, and time.     Comments: Unstable gait assisted with a cane.  Psychiatric:        Mood and Affect: Mood and affect normal.   ASSESSMENT AND PLAN:  Ms. Rigsbee was seen today for chronic disease management.   Orders Placed This Encounter  Procedures   TSH   Urinalysis with Culture Reflex   Venous stasis dermatitis of both lower extremities Assessment & Plan: Problem otherwise stable.  We discussed Dx and prognosis. She has costume compression stocking. Planning on starting Xeroform Gauze applications, which helped while she was hospitalized. Continue walking daily as tolerated and LE  elevation above heart level a few times during the day. Appropriate skin care. I di not appreciate wounds today.  Dysuria Hx is not very suggestive of UTI. Symptoms have been going on for a while, having pruritus. She has been on abx a few times for the past few months. ? Yeast infection. Will wait for UA and Ucx results before given treatment recommendations,  -     Urinalysis w microscopic + reflex cultur; Future  Essential hypertension Assessment & Plan: BP adequately controlled. Continue Chlorthalidone 25 mg daily, Losartan 100 mg daily, Metoprolol succinate 100 mg daily,and Indur 30 mg daily. Continue low salt diet.  Orders: -     TSH; Future  Return in about 4 months (around 04/18/2024).  I, Rolla Etienne Wierda, acting as a scribe for Fusae Florio [place], MD., have documented all relevant documentation on the behalf of Madline Oesterling [place], MD, as directed by  Kiela Shisler [place], MD while in the presence of Genevra Orne [place], MD.   I, Aveena Bari [place], MD, have reviewed all documentation for this visit. The documentation on 12/18/23 for the exam, diagnosis, procedures, and orders are all accurate and complete.  Girtha Kilgore G. [place], MD  [facility]. [place] office.

## 2023-12-18 NOTE — Assessment & Plan Note (Signed)
 Problem otherwise stable. We discussed Dx and prognosis. She has costume compression stocking. Planning on starting Xeroform Gauze applications, which helped while she was hospitalized. Continue walking daily as tolerated and LE elevation above heart level a few times during the day. Appropriate skin care. I di not appreciate wounds today.

## 2023-12-19 LAB — TSH: TSH: 3.27 u[IU]/mL (ref 0.35–5.50)

## 2023-12-20 ENCOUNTER — Ambulatory Visit (INDEPENDENT_AMBULATORY_CARE_PROVIDER_SITE_OTHER): Payer: No Typology Code available for payment source | Admitting: Acute Care

## 2023-12-20 DIAGNOSIS — Z87891 Personal history of nicotine dependence: Secondary | ICD-10-CM

## 2023-12-20 NOTE — Progress Notes (Addendum)
 Provider Attestation I agree with the documentation of the Shared Decision Making visit,  smoking cessation counseling if appropriate, and verification or eligibility for lung cancer screening as documented by the RN Nurse Navigator.   Raejean Bullock, MSN, AGACNP-BC [facility] See Amion for personal pager PCCM on call pager [phone]     Virtual Visit via Telephone Note  I connected with Tracy [name] on 12/20/23 at 11:00 AM EST by telephone and verified that I am speaking with the correct person using two identifiers.  Location: Patient: Tracy [name] Provider: Alyse Bach, RN   I discussed the limitations, risks, security and privacy concerns of performing an evaluation and management service by telephone and the availability of in person appointments. I also discussed with the patient that there may be a patient responsible charge related to this service. The patient expressed understanding and agreed to proceed.   Shared Decision Making Visit Lung Cancer Screening Program [phone])   Eligibility: Age 78 y.o. Pack Years Smoking History Calculation 104 (# packs/per year x # years smoked) Recent History of coughing up blood  no Unexplained weight loss? no ( >Than 15 pounds within the last 6 months ) Prior History Lung / other cancer no (Diagnosis within the last 5 years already requiring surveillance chest CT Scans). Smoking Status Former Smoker Former Smokers: Years since quit: 7 years  Quit Date: 2018  Visit Components: Discussion included one or more decision making aids. yes Discussion included risk/benefits of screening. yes Discussion included potential follow up diagnostic testing for abnormal scans. yes Discussion included meaning and risk of over diagnosis. yes Discussion included meaning and risk of False Positives. yes Discussion included meaning of total radiation exposure. yes  Counseling  Included: Importance of adherence to annual lung cancer LDCT screening. yes Impact of comorbidities on ability to participate in the program. yes Ability and willingness to under diagnostic treatment. yes  Smoking Cessation Counseling: Current Smokers:  Discussed importance of smoking cessation. yes Information about tobacco cessation classes and interventions provided to patient. yes Patient provided with "ticket" for LDCT Scan. no Symptomatic Patient. no  Counseling(Intermediate counseling: > three minutes) 99406 Diagnosis Code: Tobacco Use Z72.0 Asymptomatic Patient yes  Counseling (Intermediate counseling: > three minutes counseling) [id number] Former Smokers:  Discussed the importance of maintaining cigarette abstinence. yes Diagnosis Code: Personal History of Nicotine Dependence. Q65.784 Information about tobacco cessation classes and interventions provided to patient. Yes Patient provided with "ticket" for LDCT Scan. no Written Order for Lung Cancer Screening with LDCT placed in Epic. Yes (CT Chest Lung Cancer Screening Low Dose W/O CM) [id number] Z12.2-Screening of respiratory organs Z87.891-Personal history of nicotine dependence   Alyse Bach, RN

## 2023-12-20 NOTE — Patient Instructions (Signed)

## 2023-12-22 ENCOUNTER — Encounter: Payer: Self-pay | Admitting: Family Medicine

## 2023-12-27 ENCOUNTER — Ambulatory Visit
Admission: RE | Admit: 2023-12-27 | Discharge: 2023-12-27 | Disposition: A | Discharge status: Home / Self Care | Payer: No Typology Code available for payment source | Source: Ambulatory Visit | Attending: Family Medicine | Admitting: Family Medicine

## 2023-12-27 DIAGNOSIS — Z87891 Personal history of nicotine dependence: Secondary | ICD-10-CM | POA: Diagnosis not present

## 2023-12-27 DIAGNOSIS — Z122 Encounter for screening for malignant neoplasm of respiratory organs: Secondary | ICD-10-CM

## 2024-01-08 ENCOUNTER — Ambulatory Visit (HOSPITAL_COMMUNITY): Attending: Physician Assistant

## 2024-01-08 DIAGNOSIS — I5032 Chronic diastolic (congestive) heart failure: Secondary | ICD-10-CM | POA: Insufficient documentation

## 2024-01-08 DIAGNOSIS — I77810 Thoracic aortic ectasia: Secondary | ICD-10-CM | POA: Diagnosis not present

## 2024-01-08 DIAGNOSIS — I25119 Atherosclerotic heart disease of native coronary artery with unspecified angina pectoris: Secondary | ICD-10-CM | POA: Insufficient documentation

## 2024-01-08 LAB — ECHOCARDIOGRAM COMPLETE
Area-P 1/2: 4.39 cm2
Calc EF: 54.3 %
Est EF: 50
MV M vel: 4.3 m/s
MV Peak grad: 74 mmHg
S' Lateral: 3.73 cm
Single Plane A2C EF: 53.9 %
Single Plane A4C EF: 55 %

## 2024-01-08 MED ORDER — PERFLUTREN LIPID MICROSPHERE
1.0000 mL | INTRAVENOUS | Status: AC | PRN
  Administered 2024-01-08: 2 mL via INTRAVENOUS

## 2024-01-09 ENCOUNTER — Other Ambulatory Visit: Payer: Self-pay

## 2024-01-09 MED ORDER — FLUCONAZOLE 150 MG PO TABS
150.0000 mg | ORAL_TABLET | Freq: Once | ORAL | 0 refills | Status: AC
Start: 2024-01-09 — End: 2024-01-09

## 2024-01-09 NOTE — Progress Notes (Signed)
 RX diflucan sent for yeast symptoms per protocol

## 2024-01-10 ENCOUNTER — Other Ambulatory Visit: Payer: Self-pay | Admitting: *Deleted

## 2024-01-10 DIAGNOSIS — I340 Nonrheumatic mitral (valve) insufficiency: Secondary | ICD-10-CM

## 2024-01-12 DIAGNOSIS — M170 Bilateral primary osteoarthritis of knee: Secondary | ICD-10-CM | POA: Diagnosis not present

## 2024-01-22 ENCOUNTER — Other Ambulatory Visit: Payer: Self-pay | Admitting: Acute Care

## 2024-01-22 DIAGNOSIS — Z87891 Personal history of nicotine dependence: Secondary | ICD-10-CM

## 2024-01-22 DIAGNOSIS — Z122 Encounter for screening for malignant neoplasm of respiratory organs: Secondary | ICD-10-CM

## 2024-01-29 NOTE — Therapy (Unsigned)
 OUTPATIENT PHYSICAL THERAPY LOWER EXTREMITY EVALUATION   Patient Name: Tracy [name] MRN: [id number] DOB:06-08-1946, 78 y.o., female Today's Date: 01/31/2024  END OF SESSION:  PT End of Session - 01/31/24 1129     Visit Number 1    Number of Visits 12    Date for PT Re-Evaluation 04/01/24    Authorization Type MCR    Progress Note Due on Visit 10    PT Start Time 1125    PT Stop Time 1215    PT Time Calculation (min) 50 min    Activity Tolerance --    Behavior During Therapy WFL for tasks assessed/performed             Past Medical History:  Diagnosis Date   Allergic rhinitis    Anxiety    Arthritis    Ascending aorta dilation (HCC) 09/13/2022   Echo 09/12/2022: EF 50-55, GR 1 DD, mildly reduced RVSF, normal PASP (RVSP 17.7), mild dilation of ascending aorta (38 mm), RAP 3   Breast cancer (HCC) 04/28/2020   rigtht breast   Bronchitis    CAD (coronary artery disease)    1/19 PCI/DES to pLCX for ISR, normal EF.    Cataract    Cervical dysplasia    unsure of procedure, possible "burning" in her late 42s   CHF (congestive heart failure) (HCC)    Echo 06/2019: EF 55-60, elevated LVEDP, normal RV SF, mild MAC, mild MR, trivial TR   Complication of anesthesia    COPD (chronic obstructive pulmonary disease) (HCC)    early    Depression    GERD (gastroesophageal reflux disease)    Gout    Hyperlipidemia    Hypertension    Low back pain    Overactive bladder    PONV (postoperative nausea and vomiting)    Sleep apnea    Past Surgical History:  Procedure Laterality Date   ANGIOPLASTY     stent 2007   BREAST EXCISIONAL BIOPSY Right 04/2020   ADH/LCIS   BREAST LUMPECTOMY WITH RADIOACTIVE SEED LOCALIZATION Right 04/28/2020   Procedure: RIGHT BREAST LUMPECTOMY X 2  WITH RADIOACTIVE SEED LOCALIZATION;  Surgeon: Dareen Ebbing, MD;  Location: [facility];  Service: General;  Laterality: Right;  LMA   CARDIAC CATHETERIZATION     COLONOSCOPY      CORONARY STENT INTERVENTION N/A 11/06/2017   Procedure: CORONARY STENT INTERVENTION;  Surgeon: Sammy Crisp, MD;  Location: [facility];  Service: Cardiovascular;  Laterality: N/A;   LEFT HEART CATH AND CORONARY ANGIOGRAPHY N/A 11/06/2017   Procedure: LEFT HEART CATH AND CORONARY ANGIOGRAPHY;  Surgeon: Sammy Crisp, MD;  Location: [facility];  Service: Cardiovascular;  Laterality: N/A;   LEFT HEART CATH AND CORONARY ANGIOGRAPHY N/A 07/24/2019   Procedure: LEFT HEART CATH AND CORONARY ANGIOGRAPHY;  Surgeon: Odie Benne, MD;  Location: [facility];  Service: Cardiovascular;  Laterality: N/A;   RIGHT/LEFT HEART CATH AND CORONARY ANGIOGRAPHY N/A 09/29/2020   Procedure: RIGHT/LEFT HEART CATH AND CORONARY ANGIOGRAPHY;  Surgeon: Arty Binning, MD;  Location: [facility];  Service: Cardiovascular;  Laterality: N/A;   ROBOTIC ASSISTED BILATERAL SALPINGO OOPHERECTOMY Bilateral 11/10/2020   Procedure: XI ROBOTIC ASSISTED BILATERAL SALPINGO OOPHORECTOMY WITH MINI LAPAROTOMY FOR DRAINAGE;  Surgeon: Suzi Essex, MD;  Location: [facility];  Service: Gynecology;  Laterality: Bilateral;  MINI LAP FIRST   TONSILLECTOMY     VULVECTOMY N/A 11/10/2020   Procedure: WIDE EXCISION VULVECTOMY;  Surgeon: Orvil Bland,  Carmelina Peal, MD;  Location: [facility];  Service: Gynecology;  Laterality: N/A;   Patient Active Problem List   Diagnosis Date Noted   Weakness 10/05/2023   Bronchopneumonia 10/05/2023   Multifocal pneumonia 09/29/2023   Hypokalemia 08/07/2023   Intertrigo 04/18/2023   Venous stasis dermatitis of both lower extremities 11/28/2022   Ascending aorta dilation (HCC) 09/13/2022   Dizziness 01/19/2022   Frequent falls 01/19/2022   Tremor of both hands 08/16/2021   Aortic atherosclerosis (HCC) 06/15/2021   Generalized osteoarthritis of multiple sites 12/25/2020   Chronic pain disorder 12/25/2020   Pelvic mass in female    Bilateral lower extremity edema  04/13/2020   Shortness of breath    Anxiety disorder 10/29/2018   CKD (chronic kidney disease), stage III (HCC) 06/27/2018   COPD (chronic obstructive pulmonary disease) (HCC) 05/25/2018   Obesity 03/20/2018   Dyspnea on exertion 11/06/2017   Abnormal stress test 11/06/2017   Chronic heart failure with preserved ejection fraction (HCC) 09/04/2017   Left ventricular dysfunction 07/31/2017   Cataract, nuclear sclerotic senile, bilateral 02/01/2017   Gouty arthritis of toe of left foot 08/09/2016   Hyperuricemia 08/09/2016   Osteoarthritis (arthritis due to wear and tear of joints) 01/14/2014   Atherosclerosis of native coronary artery of native heart with angina pectoris (HCC) 08/02/2011   Mitral regurgitation 08/02/2011   OVERACTIVE BLADDER 07/02/2010   ACUTE CYSTITIS 05/25/2010   Mixed hyperlipidemia 08/11/2009   Leg edema, left 07/22/2009   GERD 04/11/2008   Constipation 04/11/2008   Essential hypertension 05/30/2007   MYOCARDIAL INFARCTION, HX OF 05/30/2007   Allergic rhinitis 05/30/2007   LOW BACK PAIN 05/30/2007    PCP: [place], [name], MD   REFERRING PROVIDER: [place], [name], MD  REFERRING DIAG: R29.6 (ICD-10-CM) - Frequent falls M54.50,G89.29 (ICD-10-CM) - Chronic low back pain without sciatica, unspecified back pain laterality  THERAPY DIAG:  Other low back pain  Muscle weakness (generalized)  Unsteadiness on feet  Rationale for Evaluation and Treatment: Rehabilitation  ONSET DATE: chronic  SUBJECTIVE:   SUBJECTIVE STATEMENT: Patient returns to OPP[facility] following hospital stay in December 2024 due to pneumonia.  She feels she has lost strength and endurance.  Mobility limited by chronic back pain as well as persistent leg ulcers and edema.  PERTINENT HISTORY: Chronic low back pain without sciatica, unspecified back pain laterality Assessment & Plan: No many options for pain management. Tramadol helped, so we decided to continue. PT will be arranged. Side  effects discussed. PDMP reviewed. Will sign med contract next visit.   Orders: -     Ambulatory referral to Physical Therapy -     traMADol HCl; Take 0.5 tablets (25 mg total) by mouth every 12 (twelve) hours as needed.  Dispense: 30 tablet; Refill: 1 PAIN:  Are you having pain? Yes: NPRS scale: 10/10 Pain location: low back Pain description: ache Aggravating factors: weather, housekeeping, prolonged position Relieving factors: medication, heat, ice  PRECAUTIONS: Fall  RED FLAGS: None   WEIGHT BEARING RESTRICTIONS: No  FALLS:  Has patient fallen in last 6 months? No  OCCUPATION: retired  PLOF: Independent with household mobility with device  PATIENT GOALS: To get around better  NEXT MD VISIT: TBD  OBJECTIVE:  Note: Objective measures were completed at Evaluation unless otherwise noted.  DIAGNOSTIC FINDINGS: none  PATIENT SURVEYS: Patient-specific activity scoring scheme (Point to one number):  "0" represents "unable to perform." "10" represents "able to perform at prior level. 0 1 2 3 4 5 6 7  8  9 10 (Date and Score) Activity Initial  Activity Eval     walking  2    Stair climbing  0    sweeping 1    Total score = sum of the activity scores/number of activities Minimum detectable change (90%CI) for average score = 2 points Minimum detectable change (90%CI) for single activity score = 3 points PSFS developed by: Jake Seats., & Binkley, J. (1995). Assessing disability and change on individual  patients: a report of a patient specific measure. Physiotherapy [place], [phone], [phone]. Reproduced with the permission of the authors  Score: 3/30 10% perceived function  MUSCLE LENGTH: Hamstrings: Right 90 deg; Left 90 deg  POSTURE:  flexed hip posture  PALPATION: N/a  LOWER EXTREMITY ROM: WFL for gait and transfers, ankle mobility limited by chronic edema  AROM Right eval Left eval  Hip flexion    Hip extension    Hip abduction     Hip adduction    Hip internal rotation    Hip external rotation    Knee flexion    Knee extension    Ankle dorsiflexion    Ankle plantarflexion    Ankle inversion    Ankle eversion     (Blank rows = not tested)  LOWER EXTREMITY MMT:  MMT Right eval Left eval  Hip flexion    Hip extension    Hip abduction    Hip adduction    Hip internal rotation    Hip external rotation    Knee flexion    Knee extension    Ankle dorsiflexion    Ankle plantarflexion    Ankle inversion    Ankle eversion     (Blank rows = not tested)  LOWER EXTREMITY SPECIAL TESTS:  N/A  FUNCTIONAL TESTS:  30 seconds chair stand test 5 reps  GAIT: Distance walked: 273ft Assistive device utilized: Walker - 4 wheeled Level of assistance: Modified independence Comments: slow cadence and flexed posture                                                                                                                                TREATMENT DATE:  [facility] Adult PT Treatment:                                                DATE: 01/31/24 Eval and HEP Self Care: Additional minutes spent for educating on updated Therapeutic Home Exercise Program as well as comparing current status to condition at start of symptoms. This included exercises focusing on stretching, strengthening, with focus on eccentric aspects. Long term goals include an improvement in range of motion, strength, endurance as well as avoiding reinjury. Patient's frequency would include in 1-2 times a day, 3-5 times a week for a duration of 6-12 weeks. Proper technique shown  and discussed handout in great detail. All questions were discussed and addressed.      PATIENT EDUCATION:  Education details: Discussed eval findings, rehab rationale and POC and patient is in agreement  Person educated: Patient Education method: Explanation Education comprehension: verbalized understanding and needs further education  HOME EXERCISE PROGRAM: Access Code:  [email or web address] URL: [email or web address] Date: 01/31/2024 Prepared by: Gustavus Bryant  Exercises - Seated Long Arc Quad  - 2-3 x daily - 5 x weekly - 2 sets - 10 reps - Seated Heel Toe Raises  - 2-3 x daily - 5 x weekly - 2 sets - 10 reps - Standing Heel Raise with Support  - 2-3 x daily - 5 x weekly - 2 sets - 10 reps - Sit to Stand with Counter Support  - 2-3 x daily - 5 x weekly - 1 sets - 5 reps  ASSESSMENT:  CLINICAL IMPRESSION: Patient is a 78 y.o. female who was seen today for physical therapy evaluation and treatment for physical deconditioning following hospital stay in 09/2023 due to bout of pneumonia.  She has a past medical history of chronic low back pain as well as BLE cellulitis and edema which limits mobility.  Sensory loss in LE's also impacts mobility and balance, increasing fall risk.  OBJECTIVE IMPAIRMENTS: Abnormal gait, decreased activity tolerance, decreased balance, decreased coordination, decreased endurance, decreased knowledge of condition, decreased knowledge of use of DME, decreased mobility, difficulty walking, decreased strength, impaired sensation, improper body mechanics, obesity, and pain.   ACTIVITY LIMITATIONS: carrying, lifting, bending, sitting, standing, squatting, stairs, and transfers  PERSONAL FACTORS: Age, Fitness, Past/current experiences, and 1-2 comorbidities: chronic low back pain and BLE cellulitis  are also affecting patient's functional outcome.   REHAB POTENTIAL: Fair based on chronicity and co-morbidities  CLINICAL DECISION MAKING: Evolving/moderate complexity  EVALUATION COMPLEXITY: Low   GOALS: Goals reviewed with patient? No  SHORT TERM GOALS: Target date: 02/21/2024   Patient to demonstrate independence in HEP  Baseline: [id number] Goal status: INITIAL  2.  Assess BERG to establish static balance baseline. Baseline: TBD Goal status: INITIAL   LONG TERM GOALS: Target date: 03/13/2024  Patient will increase 30s  chair stand reps from 5 to 8 with/without arms to demonstrate and improved functional ability with less pain/difficulty as well as reduce fall risk.  Baseline: 5 Goal status: INITIAL  2.  Patient will score at least 9/30 on PSFS to signify clinically meaningful improvement in functional abilities.   Baseline: 3/10 Goal status: INITIAL  3.  Patient will acknowledge 8/10 pain at least once during episode of care   Baseline: 10/10 Goal status: INITIAL  4.  Perform 2 MWT to assess progress in strength and endurance. Baseline: 228ft Goal status: INITIAL  5.  Perform BERG to assess progress in balance/stability. Baseline: TBD Goal status: INITIAL    PLAN:  PT FREQUENCY: 2x/week  PT DURATION: 6 weeks  PLANNED INTERVENTIONS: 97164- PT Re-evaluation, 97110-Therapeutic exercises, 97530- Therapeutic activity, 97112- Neuromuscular re-education, 97535- Self Care, 14782- Manual therapy, and 97116- Gait training  PLAN FOR NEXT SESSION: HEP review and update, manual techniques as appropriate, aerobic tasks, ROM and flexibility activities, strengthening and PREs, TPDN, gait and balance training as needed     Hildred Laser, PT 01/31/2024, 1:15 PM

## 2024-01-31 ENCOUNTER — Ambulatory Visit: Attending: Family Medicine

## 2024-01-31 ENCOUNTER — Other Ambulatory Visit: Payer: Self-pay

## 2024-01-31 DIAGNOSIS — R2681 Unsteadiness on feet: Secondary | ICD-10-CM | POA: Insufficient documentation

## 2024-01-31 DIAGNOSIS — R531 Weakness: Secondary | ICD-10-CM | POA: Insufficient documentation

## 2024-01-31 DIAGNOSIS — M6281 Muscle weakness (generalized): Secondary | ICD-10-CM | POA: Diagnosis not present

## 2024-01-31 DIAGNOSIS — J180 Bronchopneumonia, unspecified organism: Secondary | ICD-10-CM | POA: Insufficient documentation

## 2024-01-31 DIAGNOSIS — M5459 Other low back pain: Secondary | ICD-10-CM | POA: Diagnosis not present

## 2024-02-08 ENCOUNTER — Other Ambulatory Visit: Payer: Self-pay | Admitting: Family Medicine

## 2024-02-08 DIAGNOSIS — R600 Localized edema: Secondary | ICD-10-CM

## 2024-02-08 DIAGNOSIS — I10 Essential (primary) hypertension: Secondary | ICD-10-CM

## 2024-02-09 NOTE — Therapy (Signed)
 OUTPATIENT PHYSICAL THERAPY LOWER EXTREMITY EVALUATION   Patient Name: Tracy [name] MRN: [id number] DOB:[birth year], 78 y.o., female Today's Date: 02/12/2024  END OF SESSION:  PT End of Session - 02/12/24 1137     Visit Number 2    Number of Visits 12    Date for PT Re-Evaluation 04/01/24    Authorization Type MCR    Progress Note Due on Visit 10    PT Start Time 1130    PT Stop Time 1210    PT Time Calculation (min) 40 min    Activity Tolerance Patient tolerated treatment well;Patient limited by fatigue    Behavior During Therapy [facility] for tasks assessed/performed              Past Medical History:  Diagnosis Date   Allergic rhinitis    Anxiety    Arthritis    Ascending aorta dilation (HCC) 09/13/2022   Echo 09/12/2022: EF 50-55, GR 1 DD, mildly reduced RVSF, normal PASP (RVSP 17.7), mild dilation of ascending aorta (38 mm), RAP 3   Breast cancer (HCC) 04/28/2020   rigtht breast   Bronchitis    CAD (coronary artery disease)    1/19 PCI/DES to pLCX for ISR, normal EF.    Cataract    Cervical dysplasia    unsure of procedure, possible "burning" in her late 3s   CHF (congestive heart failure) (HCC)    Echo 06/2019: EF 55-60, elevated LVEDP, normal RV SF, mild MAC, mild MR, trivial TR   Complication of anesthesia    COPD (chronic obstructive pulmonary disease) (HCC)    early    Depression    GERD (gastroesophageal reflux disease)    Gout    Hyperlipidemia    Hypertension    Low back pain    Overactive bladder    PONV (postoperative nausea and vomiting)    Sleep apnea    Past Surgical History:  Procedure Laterality Date   ANGIOPLASTY     stent 2007   BREAST EXCISIONAL BIOPSY Right 04/2020   ADH/LCIS   BREAST LUMPECTOMY WITH RADIOACTIVE SEED LOCALIZATION Right 04/28/2020   Procedure: RIGHT BREAST LUMPECTOMY X 2  WITH RADIOACTIVE SEED LOCALIZATION;  Surgeon: Dareen Ebbing, MD;  Location: [facility];  Service: General;  Laterality: Right;   LMA   CARDIAC CATHETERIZATION     COLONOSCOPY     CORONARY STENT INTERVENTION N/A 11/06/2017   Procedure: CORONARY STENT INTERVENTION;  Surgeon: Sammy Crisp, MD;  Location: [facility];  Service: Cardiovascular;  Laterality: N/A;   LEFT HEART CATH AND CORONARY ANGIOGRAPHY N/A 11/06/2017   Procedure: LEFT HEART CATH AND CORONARY ANGIOGRAPHY;  Surgeon: Sammy Crisp, MD;  Location: [facility];  Service: Cardiovascular;  Laterality: N/A;   LEFT HEART CATH AND CORONARY ANGIOGRAPHY N/A 07/24/2019   Procedure: LEFT HEART CATH AND CORONARY ANGIOGRAPHY;  Surgeon: Odie Benne, MD;  Location: [facility];  Service: Cardiovascular;  Laterality: N/A;   RIGHT/LEFT HEART CATH AND CORONARY ANGIOGRAPHY N/A 09/29/2020   Procedure: RIGHT/LEFT HEART CATH AND CORONARY ANGIOGRAPHY;  Surgeon: Arty Binning, MD;  Location: [facility];  Service: Cardiovascular;  Laterality: N/A;   ROBOTIC ASSISTED BILATERAL SALPINGO OOPHERECTOMY Bilateral 11/10/2020   Procedure: XI ROBOTIC ASSISTED BILATERAL SALPINGO OOPHORECTOMY WITH MINI LAPAROTOMY FOR DRAINAGE;  Surgeon: Suzi Essex, MD;  Location: [facility];  Service: Gynecology;  Laterality: Bilateral;  MINI LAP FIRST   TONSILLECTOMY     VULVECTOMY N/A 11/10/2020  Procedure: WIDE EXCISION VULVECTOMY;  Surgeon: Suzi Essex, MD;  Location: [facility];  Service: Gynecology;  Laterality: N/A;   Patient Active Problem List   Diagnosis Date Noted   Weakness 10/05/2023   Bronchopneumonia 10/05/2023   Multifocal pneumonia 09/29/2023   Hypokalemia 08/07/2023   Intertrigo 04/18/2023   Venous stasis dermatitis of both lower extremities 11/28/2022   Ascending aorta dilation (HCC) 09/13/2022   Dizziness 01/19/2022   Frequent falls 01/19/2022   Tremor of both hands 08/16/2021   Aortic atherosclerosis (HCC) 06/15/2021   Generalized osteoarthritis of multiple sites 12/25/2020   Chronic pain disorder 12/25/2020   Pelvic mass  in female    Bilateral lower extremity edema 04/13/2020   Shortness of breath    Anxiety disorder 10/29/2018   CKD (chronic kidney disease), stage III (HCC) 06/27/2018   COPD (chronic obstructive pulmonary disease) (HCC) 05/25/2018   Obesity 03/20/2018   Dyspnea on exertion 11/06/2017   Abnormal stress test 11/06/2017   Chronic heart failure with preserved ejection fraction (HCC) 09/04/2017   Left ventricular dysfunction 07/31/2017   Cataract, nuclear sclerotic senile, bilateral 02/01/2017   Gouty arthritis of toe of left foot 08/09/2016   Hyperuricemia 08/09/2016   Osteoarthritis (arthritis due to wear and tear of joints) 01/14/2014   Atherosclerosis of native coronary artery of native heart with angina pectoris (HCC) 08/02/2011   Mitral regurgitation 08/02/2011   OVERACTIVE BLADDER 07/02/2010   ACUTE CYSTITIS 05/25/2010   Mixed hyperlipidemia 08/11/2009   Leg edema, left 07/22/2009   GERD 04/11/2008   Constipation 04/11/2008   Essential hypertension 05/30/2007   MYOCARDIAL INFARCTION, HX OF 05/30/2007   Allergic rhinitis 05/30/2007   LOW BACK PAIN 05/30/2007    PCP: [place], [name], MD   REFERRING PROVIDER: [place], [name], MD  REFERRING DIAG: R29.6 (ICD-10-CM) - Frequent falls M54.50,G89.29 (ICD-10-CM) - Chronic low back pain without sciatica, unspecified back pain laterality  THERAPY DIAG:  Other low back pain  Muscle weakness (generalized)  Unsteadiness on feet  Rationale for Evaluation and Treatment: Rehabilitation  ONSET DATE: chronic  SUBJECTIVE:   SUBJECTIVE STATEMENT: Patient returns to [facility] following hospital stay in December 2024 due to pneumonia.  She feels she has lost strength and endurance.  Mobility limited by chronic back pain as well as persistent leg ulcers and edema.  PERTINENT HISTORY: Chronic low back pain without sciatica, unspecified back pain laterality Assessment & Plan: No many options for pain management. Tramadol  helped, so we  decided to continue. PT will be arranged. Side effects discussed. PDMP reviewed. Will sign med contract next visit.   Orders: -     Ambulatory referral to Physical Therapy -     traMADol  HCl; Take 0.5 tablets (25 mg total) by mouth every 12 (twelve) hours as needed.  Dispense: 30 tablet; Refill: 1 PAIN:  Are you having pain? Yes: NPRS scale: 10/10 Pain location: low back Pain description: ache Aggravating factors: weather, housekeeping, prolonged position Relieving factors: medication, heat, ice  PRECAUTIONS: Fall  RED FLAGS: None   WEIGHT BEARING RESTRICTIONS: No  FALLS:  Has patient fallen in last 6 months? No  OCCUPATION: retired  PLOF: Independent with household mobility with device  PATIENT GOALS: To get around better  NEXT MD VISIT: TBD  OBJECTIVE:  Note: Objective measures were completed at Evaluation unless otherwise noted.  DIAGNOSTIC FINDINGS: none  PATIENT SURVEYS: Patient-specific activity scoring scheme (Point to one number):  "0" represents "unable to perform." "10" represents "able to perform at prior level. 0 1  2 3 4 5 6 7 8 9 10  (Date and Score) Activity Initial  Activity Eval     walking  2    Stair climbing  0    sweeping 1    Total score = sum of the activity scores/number of activities Minimum detectable change (90%CI) for average score = 2 points Minimum detectable change (90%CI) for single activity score = 3 points PSFS developed by: [place] Spitz., & Binkley, J. (1995). Assessing disability and change on individual  patients: a report of a patient specific measure. Physiotherapy [place], [phone], [phone]. Reproduced with the permission of the authors  Score: 3/30 10% perceived function  MUSCLE LENGTH: Hamstrings: Right 90 deg; Left 90 deg  POSTURE:  flexed hip posture  PALPATION: N/a  LOWER EXTREMITY ROM: WFL for gait and transfers, ankle mobility limited by chronic edema  AROM Right eval Left eval  Hip  flexion    Hip extension    Hip abduction    Hip adduction    Hip internal rotation    Hip external rotation    Knee flexion    Knee extension    Ankle dorsiflexion    Ankle plantarflexion    Ankle inversion    Ankle eversion     (Blank rows = not tested)  LOWER EXTREMITY MMT:  MMT Right eval Left eval  Hip flexion    Hip extension    Hip abduction    Hip adduction    Hip internal rotation    Hip external rotation    Knee flexion    Knee extension    Ankle dorsiflexion    Ankle plantarflexion    Ankle inversion    Ankle eversion     (Blank rows = not tested)  LOWER EXTREMITY SPECIAL TESTS:  N/A  FUNCTIONAL TESTS:  30 seconds chair stand test 5 reps  GAIT: Distance walked: 251ft Assistive device utilized: Environmental consultant - 4 wheeled Level of assistance: Modified independence Comments: slow cadence and flexed posture                                                                                                                                TREATMENT DATE:  [facility] Adult PT Treatment:                                                DATE: 02/12/24 Therapeutic Exercise: Nustep L4 8 min Neuromuscular re-ed:   02/12/24 0001  Berg Balance Test  Sit to Stand 3  Standing Unsupported 4  Sitting with Back Unsupported but Feet Supported on Floor or Stool 4  Stand to Sit 4  Transfers 3  Standing Unsupported with Eyes Closed 4  Standing Unsupported with Feet Together 3  From Standing, Reach Forward with Outstretched Arm 3  From Standing  Position, Pick up Object from Floor 3  From Standing Position, Turn to Look Behind Over each Shoulder 2  Turn 360 Degrees 1  Standing Unsupported, Alternately Place Feet on Step/Stool 0  Standing Unsupported, One Foot in Front 2  Standing on One Leg 0  Total Score 36   Therapeutic Activity: Seated FAQs 15/15 2s hold Standing heel raises 15x with support Standing hamstring curls with support 15x B  [facility] Adult PT Treatment:                                                 DATE: 01/31/24 Eval and HEP Self Care: Additional minutes spent for educating on updated Therapeutic Home Exercise Program as well as comparing current status to condition at start of symptoms. This included exercises focusing on stretching, strengthening, with focus on eccentric aspects. Long term goals include an improvement in range of motion, strength, endurance as well as avoiding reinjury. Patient's frequency would include in 1-2 times a day, 3-5 times a week for a duration of 6-12 weeks. Proper technique shown and discussed handout in great detail. All questions were discussed and addressed.      PATIENT EDUCATION:  Education details: Discussed eval findings, rehab rationale and POC and patient is in agreement  Person educated: Patient Education method: Explanation Education comprehension: verbalized understanding and needs further education  HOME EXERCISE PROGRAM: Access Code: [email or web address] URL: [email or web address] Date: 01/31/2024 Prepared by: Gretta Leavens  Exercises - Seated Long Arc Quad  - 2-3 x daily - 5 x weekly - 2 sets - 10 reps - Seated Heel Toe Raises  - 2-3 x daily - 5 x weekly - 2 sets - 10 reps - Standing Heel Raise with Support  - 2-3 x daily - 5 x weekly - 2 sets - 10 reps - Sit to Stand with Counter Support  - 2-3 x daily - 5 x weekly - 1 sets - 5 reps  ASSESSMENT:  CLINICAL IMPRESSION:  Focus of today was assessment of BERG test to establish baseline static balance.  Added LE strengthening tasks and aerobic work to improve endurance.  Incorporated standing BLE exercises for ROM, strength and balance.  Patient is a 78 y.o. female who was seen today for physical therapy evaluation and treatment for physical deconditioning following hospital stay in 09/2023 due to bout of pneumonia.  She has a past medical history of chronic low back pain as well as BLE cellulitis and edema which limits mobility.  Sensory loss in LE's also  impacts mobility and balance, increasing fall risk.  OBJECTIVE IMPAIRMENTS: Abnormal gait, decreased activity tolerance, decreased balance, decreased coordination, decreased endurance, decreased knowledge of condition, decreased knowledge of use of DME, decreased mobility, difficulty walking, decreased strength, impaired sensation, improper body mechanics, obesity, and pain.   ACTIVITY LIMITATIONS: carrying, lifting, bending, sitting, standing, squatting, stairs, and transfers  PERSONAL FACTORS: Age, Fitness, Past/current experiences, and 1-2 comorbidities: chronic low back pain and BLE cellulitis  are also affecting patient's functional outcome.   REHAB POTENTIAL: Fair based on chronicity and co-morbidities  CLINICAL DECISION MAKING: Evolving/moderate complexity  EVALUATION COMPLEXITY: Low   GOALS: Goals reviewed with patient? No  SHORT TERM GOALS: Target date: 02/21/2024   Patient to demonstrate independence in HEP  Baseline: [id number] Goal status: INITIAL  2.  Assess BERG to establish static balance baseline. Baseline: TBD; 02/12/24 BERG  complete Goal status: Met   LONG TERM GOALS: Target date: 03/13/2024  Patient will increase 30s chair stand reps from 5 to 8 with/without arms to demonstrate and improved functional ability with less pain/difficulty as well as reduce fall risk.  Baseline: 5 Goal status: INITIAL  2.  Patient will score at least 9/30 on PSFS to signify clinically meaningful improvement in functional abilities.   Baseline: 3/10 Goal status: INITIAL  3.  Patient will acknowledge 8/10 pain at least once during episode of care   Baseline: 10/10 Goal status: INITIAL  4.  Perform 2 MWT to assess progress in strength and endurance. Baseline: 240ft Goal status: INITIAL  5.  Perform BERG to assess progress in balance/stability. Baseline: TBD Goal status: INITIAL    PLAN:  PT FREQUENCY: 2x/week  PT DURATION: 6 weeks  PLANNED INTERVENTIONS: 97164- PT  Re-evaluation, 97110-Therapeutic exercises, 97530- Therapeutic activity, 97112- Neuromuscular re-education, 97535- Self Care, 16109- Manual therapy, and 97116- Gait training  PLAN FOR NEXT SESSION: HEP review and update, manual techniques as appropriate, aerobic tasks, ROM and flexibility activities, strengthening and PREs, TPDN, gait and balance training as needed     Eldon Green[place], PT 02/12/2024, 12:07 PM

## 2024-02-12 ENCOUNTER — Ambulatory Visit

## 2024-02-12 DIAGNOSIS — J180 Bronchopneumonia, unspecified organism: Secondary | ICD-10-CM | POA: Diagnosis not present

## 2024-02-12 DIAGNOSIS — R2681 Unsteadiness on feet: Secondary | ICD-10-CM

## 2024-02-12 DIAGNOSIS — M6281 Muscle weakness (generalized): Secondary | ICD-10-CM | POA: Diagnosis not present

## 2024-02-12 DIAGNOSIS — M5459 Other low back pain: Secondary | ICD-10-CM | POA: Diagnosis not present

## 2024-02-12 DIAGNOSIS — R531 Weakness: Secondary | ICD-10-CM | POA: Diagnosis not present

## 2024-02-13 NOTE — Therapy (Unsigned)
 OUTPATIENT PHYSICAL THERAPY TREATMENT NOTE   Patient Name: MARIONNA GUTIERRES MRN: [id number] DOB:03-21-1946, 78 y.o., female Today's Date: 02/14/2024  END OF SESSION:  PT End of Session - 02/14/24 1207     Visit Number 3    Number of Visits 12    Date for PT Re-Evaluation 04/01/24    Authorization Type MCR    Progress Note Due on Visit 10    PT Start Time 1215    PT Stop Time 1253    PT Time Calculation (min) 38 min    Activity Tolerance Patient tolerated treatment well;Patient limited by fatigue    Behavior During Therapy [facility] for tasks assessed/performed               Past Medical History:  Diagnosis Date   Allergic rhinitis    Anxiety    Arthritis    Ascending aorta dilation (HCC) 09/13/2022   Echo 09/12/2022: EF 50-55, GR 1 DD, mildly reduced RVSF, normal PASP (RVSP 17.7), mild dilation of ascending aorta (38 mm), RAP 3   Breast cancer (HCC) 04/28/2020   rigtht breast   Bronchitis    CAD (coronary artery disease)    1/19 PCI/DES to pLCX for ISR, normal EF.    Cataract    Cervical dysplasia    unsure of procedure, possible "burning" in her late 71s   CHF (congestive heart failure) (HCC)    Echo 06/2019: EF 55-60, elevated LVEDP, normal RV SF, mild MAC, mild MR, trivial TR   Complication of anesthesia    COPD (chronic obstructive pulmonary disease) (HCC)    early    Depression    GERD (gastroesophageal reflux disease)    Gout    Hyperlipidemia    Hypertension    Low back pain    Overactive bladder    PONV (postoperative nausea and vomiting)    Sleep apnea    Past Surgical History:  Procedure Laterality Date   ANGIOPLASTY     stent 2007   BREAST EXCISIONAL BIOPSY Right 04/2020   ADH/LCIS   BREAST LUMPECTOMY WITH RADIOACTIVE SEED LOCALIZATION Right 04/28/2020   Procedure: RIGHT BREAST LUMPECTOMY X 2  WITH RADIOACTIVE SEED LOCALIZATION;  Surgeon: Dareen Ebbing, MD;  Location: [facility];  Service: General;  Laterality: Right;  LMA    CARDIAC CATHETERIZATION     COLONOSCOPY     CORONARY STENT INTERVENTION N/A 11/06/2017   Procedure: CORONARY STENT INTERVENTION;  Surgeon: Sammy Crisp, MD;  Location: [facility];  Service: Cardiovascular;  Laterality: N/A;   LEFT HEART CATH AND CORONARY ANGIOGRAPHY N/A 11/06/2017   Procedure: LEFT HEART CATH AND CORONARY ANGIOGRAPHY;  Surgeon: Sammy Crisp, MD;  Location: [facility];  Service: Cardiovascular;  Laterality: N/A;   LEFT HEART CATH AND CORONARY ANGIOGRAPHY N/A 07/24/2019   Procedure: LEFT HEART CATH AND CORONARY ANGIOGRAPHY;  Surgeon: Odie Benne, MD;  Location: [facility];  Service: Cardiovascular;  Laterality: N/A;   RIGHT/LEFT HEART CATH AND CORONARY ANGIOGRAPHY N/A 09/29/2020   Procedure: RIGHT/LEFT HEART CATH AND CORONARY ANGIOGRAPHY;  Surgeon: Arty Binning, MD;  Location: [facility];  Service: Cardiovascular;  Laterality: N/A;   ROBOTIC ASSISTED BILATERAL SALPINGO OOPHERECTOMY Bilateral 11/10/2020   Procedure: XI ROBOTIC ASSISTED BILATERAL SALPINGO OOPHORECTOMY WITH MINI LAPAROTOMY FOR DRAINAGE;  Surgeon: Suzi Essex, MD;  Location: [facility];  Service: Gynecology;  Laterality: Bilateral;  MINI LAP FIRST   TONSILLECTOMY     VULVECTOMY N/A 11/10/2020  Procedure: WIDE EXCISION VULVECTOMY;  Surgeon: Suzi Essex, MD;  Location: [facility];  Service: Gynecology;  Laterality: N/A;   Patient Active Problem List   Diagnosis Date Noted   Weakness 10/05/2023   Bronchopneumonia 10/05/2023   Multifocal pneumonia 09/29/2023   Hypokalemia 08/07/2023   Intertrigo 04/18/2023   Venous stasis dermatitis of both lower extremities 11/28/2022   Ascending aorta dilation (HCC) 09/13/2022   Dizziness 01/19/2022   Frequent falls 01/19/2022   Tremor of both hands 08/16/2021   Aortic atherosclerosis (HCC) 06/15/2021   Generalized osteoarthritis of multiple sites 12/25/2020   Chronic pain disorder 12/25/2020   Pelvic mass in  female    Bilateral lower extremity edema 04/13/2020   Shortness of breath    Anxiety disorder 10/29/2018   CKD (chronic kidney disease), stage III (HCC) 06/27/2018   COPD (chronic obstructive pulmonary disease) (HCC) 05/25/2018   Obesity 03/20/2018   Dyspnea on exertion 11/06/2017   Abnormal stress test 11/06/2017   Chronic heart failure with preserved ejection fraction (HCC) 09/04/2017   Left ventricular dysfunction 07/31/2017   Cataract, nuclear sclerotic senile, bilateral 02/01/2017   Gouty arthritis of toe of left foot 08/09/2016   Hyperuricemia 08/09/2016   Osteoarthritis (arthritis due to wear and tear of joints) 01/14/2014   Atherosclerosis of native coronary artery of native heart with angina pectoris (HCC) 08/02/2011   Mitral regurgitation 08/02/2011   OVERACTIVE BLADDER 07/02/2010   ACUTE CYSTITIS 05/25/2010   Mixed hyperlipidemia 08/11/2009   Leg edema, left 07/22/2009   GERD 04/11/2008   Constipation 04/11/2008   Essential hypertension 05/30/2007   MYOCARDIAL INFARCTION, HX OF 05/30/2007   Allergic rhinitis 05/30/2007   LOW BACK PAIN 05/30/2007    PCP: [place], [name], MD   REFERRING PROVIDER: [place], [name], MD  REFERRING DIAG: R29.6 (ICD-10-CM) - Frequent falls M54.50,G89.29 (ICD-10-CM) - Chronic low back pain without sciatica, unspecified back pain laterality  THERAPY DIAG:  Other low back pain  Muscle weakness (generalized)  Unsteadiness on feet  Rationale for Evaluation and Treatment: Rehabilitation  ONSET DATE: chronic  SUBJECTIVE:   SUBJECTIVE STATEMENT: Reports some L knee pain today from underlying degenerative changes.  Does not sleep well due to open areas on legs.  PERTINENT HISTORY: Chronic low back pain without sciatica, unspecified back pain laterality Assessment & Plan: No many options for pain management. Tramadol  helped, so we decided to continue. PT will be arranged. Side effects discussed. PDMP reviewed. Will sign med  contract next visit.   Orders: -     Ambulatory referral to Physical Therapy -     traMADol  HCl; Take 0.5 tablets (25 mg total) by mouth every 12 (twelve) hours as needed.  Dispense: 30 tablet; Refill: 1 PAIN:  Are you having pain? Yes: NPRS scale: 10/10 Pain location: low back Pain description: ache Aggravating factors: weather, housekeeping, prolonged position Relieving factors: medication, heat, ice  PRECAUTIONS: Fall  RED FLAGS: None   WEIGHT BEARING RESTRICTIONS: No  FALLS:  Has patient fallen in last 6 months? No  OCCUPATION: retired  PLOF: Independent with household mobility with device  PATIENT GOALS: To get around better  NEXT MD VISIT: TBD  OBJECTIVE:  Note: Objective measures were completed at Evaluation unless otherwise noted.  DIAGNOSTIC FINDINGS: none  PATIENT SURVEYS: Patient-specific activity scoring scheme (Point to one number):  "0" represents "unable to perform." "10" represents "able to perform at prior level. 0 1 2 3 4 5 6 7 8 9  10 (Date and Score) Activity Initial  Activity  Eval     walking  2    Stair climbing  0    sweeping 1    Total score = sum of the activity scores/number of activities Minimum detectable change (90%CI) for average score = 2 points Minimum detectable change (90%CI) for single activity score = 3 points PSFS developed by: [place] Spitz., & Binkley, J. (1995). Assessing disability and change on individual  patients: a report of a patient specific measure. Physiotherapy [place], [phone], [phone]. Reproduced with the permission of the authors  Score: 3/30 10% perceived function  MUSCLE LENGTH: Hamstrings: Right 90 deg; Left 90 deg  POSTURE:  flexed hip posture  PALPATION: N/a  LOWER EXTREMITY ROM: WFL for gait and transfers, ankle mobility limited by chronic edema  AROM Right eval Left eval  Hip flexion    Hip extension    Hip abduction    Hip adduction    Hip internal rotation    Hip  external rotation    Knee flexion    Knee extension    Ankle dorsiflexion    Ankle plantarflexion    Ankle inversion    Ankle eversion     (Blank rows = not tested)  LOWER EXTREMITY MMT:  MMT Right eval Left eval  Hip flexion    Hip extension    Hip abduction    Hip adduction    Hip internal rotation    Hip external rotation    Knee flexion    Knee extension    Ankle dorsiflexion    Ankle plantarflexion    Ankle inversion    Ankle eversion     (Blank rows = not tested)  LOWER EXTREMITY SPECIAL TESTS:  N/A  FUNCTIONAL TESTS:  30 seconds chair stand test 5 reps  GAIT: Distance walked: 254ft Assistive device utilized: Environmental consultant - 4 wheeled Level of assistance: Modified independence Comments: slow cadence and flexed posture                                                                                                                                TREATMENT DATE:  [facility] Adult PT Treatment:                                                DATE: 02/14/24 Therapeutic Exercise: Nustep L2 8 min Neuromuscular re-ed: Seated heel/toe 15x2 Seated FAQs 15x2 B Tandem stance on 4 in block 5/5 in walker no UE support Therapeutic Activity: Standing marching 15/15 Standing heel raises 15x Standing toe raises 15x Standing ham curls 15x B  [facility] Adult PT Treatment:  DATE: 02/12/24 Therapeutic Exercise: Nustep L4 8 min Neuromuscular re-ed:   02/12/24 0001  Berg Balance Test  Sit to Stand 3  Standing Unsupported 4  Sitting with Back Unsupported but Feet Supported on Floor or Stool 4  Stand to Sit 4  Transfers 3  Standing Unsupported with Eyes Closed 4  Standing Unsupported with Feet Together 3  From Standing, Reach Forward with Outstretched Arm 3  From Standing Position, Pick up Object from Floor 3  From Standing Position, Turn to Look Behind Over each Shoulder 2  Turn 360 Degrees 1  Standing Unsupported, Alternately Place Feet  on Step/Stool 0  Standing Unsupported, One Foot in Front 2  Standing on One Leg 0  Total Score 36   Therapeutic Activity: Seated FAQs 15/15 2s hold Standing heel raises 15x with support Standing hamstring curls with support 15x B  [facility] Adult PT Treatment:                                                DATE: 01/31/24 Eval and HEP Self Care: Additional minutes spent for educating on updated Therapeutic Home Exercise Program as well as comparing current status to condition at start of symptoms. This included exercises focusing on stretching, strengthening, with focus on eccentric aspects. Long term goals include an improvement in range of motion, strength, endurance as well as avoiding reinjury. Patient's frequency would include in 1-2 times a day, 3-5 times a week for a duration of 6-12 weeks. Proper technique shown and discussed handout in great detail. All questions were discussed and addressed.      PATIENT EDUCATION:  Education details: Discussed eval findings, rehab rationale and POC and patient is in agreement  Person educated: Patient Education method: Explanation Education comprehension: verbalized understanding and needs further education  HOME EXERCISE PROGRAM: Access Code: [email or web address] URL: [email or web address] Date: 01/31/2024 Prepared by: Gretta Leavens  Exercises - Seated Long Arc Quad  - 2-3 x daily - 5 x weekly - 2 sets - 10 reps - Seated Heel Toe Raises  - 2-3 x daily - 5 x weekly - 2 sets - 10 reps - Standing Heel Raise with Support  - 2-3 x daily - 5 x weekly - 2 sets - 10 reps - Sit to Stand with Counter Support  - 2-3 x daily - 5 x weekly - 1 sets - 5 reps  ASSESSMENT:  CLINICAL IMPRESSION:  Session focused on aerobic work as well as LE strength and mobility exercises.  Chronic BLE edema and open areas continue to affect mobility status and elevate fall risk due to diminished sensation and proprioception.  Added tandem stance in walker to improve  proprioception with light CGA needed.  Patient is a 78 y.o. female who was seen today for physical therapy evaluation and treatment for physical deconditioning following hospital stay in 09/2023 due to bout of pneumonia.  She has a past medical history of chronic low back pain as well as BLE cellulitis and edema which limits mobility.  Sensory loss in LE's also impacts mobility and balance, increasing fall risk.  OBJECTIVE IMPAIRMENTS: Abnormal gait, decreased activity tolerance, decreased balance, decreased coordination, decreased endurance, decreased knowledge of condition, decreased knowledge of use of DME, decreased mobility, difficulty walking, decreased strength, impaired sensation, improper body mechanics, obesity, and pain.   ACTIVITY LIMITATIONS: carrying, lifting, bending, sitting, standing, squatting, stairs, and  transfers  PERSONAL FACTORS: Age, Fitness, Past/current experiences, and 1-2 comorbidities: chronic low back pain and BLE cellulitis  are also affecting patient's functional outcome.   REHAB POTENTIAL: Fair based on chronicity and co-morbidities  CLINICAL DECISION MAKING: Evolving/moderate complexity  EVALUATION COMPLEXITY: Low   GOALS: Goals reviewed with patient? No  SHORT TERM GOALS: Target date: 02/21/2024   Patient to demonstrate independence in HEP  Baseline: [id number] Goal status: INITIAL  2.  Assess BERG to establish static balance baseline. Baseline: TBD; 02/12/24 BERG complete Goal status: Met   LONG TERM GOALS: Target date: 03/13/2024  Patient will increase 30s chair stand reps from 5 to 8 with/without arms to demonstrate and improved functional ability with less pain/difficulty as well as reduce fall risk.  Baseline: 5 Goal status: INITIAL  2.  Patient will score at least 9/30 on PSFS to signify clinically meaningful improvement in functional abilities.   Baseline: 3/10 Goal status: INITIAL  3.  Patient will acknowledge 8/10 pain at least once  during episode of care   Baseline: 10/10 Goal status: INITIAL  4.  Perform 2 MWT to assess progress in strength and endurance. Baseline: 24ft Goal status: INITIAL  5.  Perform BERG to assess progress in balance/stability. Baseline: TBD Goal status: INITIAL    PLAN:  PT FREQUENCY: 2x/week  PT DURATION: 6 weeks  PLANNED INTERVENTIONS: 97164- PT Re-evaluation, 97110-Therapeutic exercises, 97530- Therapeutic activity, 97112- Neuromuscular re-education, 97535- Self Care, 54098- Manual therapy, and 97116- Gait training  PLAN FOR NEXT SESSION: HEP review and update, manual techniques as appropriate, aerobic tasks, ROM and flexibility activities, strengthening and PREs, TPDN, gait and balance training as needed     Eldon Greenland, PT 02/14/2024, 12:53 PM

## 2024-02-14 ENCOUNTER — Ambulatory Visit

## 2024-02-14 DIAGNOSIS — M5459 Other low back pain: Secondary | ICD-10-CM | POA: Diagnosis not present

## 2024-02-14 DIAGNOSIS — R2681 Unsteadiness on feet: Secondary | ICD-10-CM

## 2024-02-14 DIAGNOSIS — J180 Bronchopneumonia, unspecified organism: Secondary | ICD-10-CM | POA: Diagnosis not present

## 2024-02-14 DIAGNOSIS — R531 Weakness: Secondary | ICD-10-CM | POA: Diagnosis not present

## 2024-02-14 DIAGNOSIS — M6281 Muscle weakness (generalized): Secondary | ICD-10-CM | POA: Diagnosis not present

## 2024-02-19 ENCOUNTER — Ambulatory Visit: Attending: Family Medicine

## 2024-02-19 DIAGNOSIS — R2681 Unsteadiness on feet: Secondary | ICD-10-CM

## 2024-02-19 DIAGNOSIS — M5459 Other low back pain: Secondary | ICD-10-CM

## 2024-02-19 DIAGNOSIS — M6281 Muscle weakness (generalized): Secondary | ICD-10-CM | POA: Diagnosis not present

## 2024-02-19 NOTE — Therapy (Signed)
 OUTPATIENT PHYSICAL THERAPY TREATMENT NOTE   Patient Name: Tracy [name] MRN: [id number] DOB:Nov 08, 1945, 78 y.o., female Today's Date: 02/19/2024  END OF SESSION:  PT End of Session - 02/19/24 1136     Visit Number 4    Number of Visits 12    Date for PT Re-Evaluation 04/01/24    Authorization Type MCR    Progress Note Due on Visit 10    PT Start Time 1130    PT Stop Time 1208    PT Time Calculation (min) 38 min    Activity Tolerance Patient tolerated treatment well;Patient limited by fatigue    Behavior During Therapy [facility] for tasks assessed/performed                Past Medical History:  Diagnosis Date   Allergic rhinitis    Anxiety    Arthritis    Ascending aorta dilation (HCC) 09/13/2022   Echo 09/12/2022: EF 50-55, GR 1 DD, mildly reduced RVSF, normal PASP (RVSP 17.7), mild dilation of ascending aorta (38 mm), RAP 3   Breast cancer (HCC) 04/28/2020   rigtht breast   Bronchitis    CAD (coronary artery disease)    1/19 PCI/DES to pLCX for ISR, normal EF.    Cataract    Cervical dysplasia    unsure of procedure, possible "burning" in her late 36s   CHF (congestive heart failure) (HCC)    Echo 06/2019: EF 55-60, elevated LVEDP, normal RV SF, mild MAC, mild MR, trivial TR   Complication of anesthesia    COPD (chronic obstructive pulmonary disease) (HCC)    early    Depression    GERD (gastroesophageal reflux disease)    Gout    Hyperlipidemia    Hypertension    Low back pain    Overactive bladder    PONV (postoperative nausea and vomiting)    Sleep apnea    Past Surgical History:  Procedure Laterality Date   ANGIOPLASTY     stent 2007   BREAST EXCISIONAL BIOPSY Right 04/2020   ADH/LCIS   BREAST LUMPECTOMY WITH RADIOACTIVE SEED LOCALIZATION Right 04/28/2020   Procedure: RIGHT BREAST LUMPECTOMY X 2  WITH RADIOACTIVE SEED LOCALIZATION;  Surgeon: Dareen Ebbing, MD;  Location: [facility];  Service: General;  Laterality: Right;  LMA    CARDIAC CATHETERIZATION     COLONOSCOPY     CORONARY STENT INTERVENTION N/A 11/06/2017   Procedure: CORONARY STENT INTERVENTION;  Surgeon: Sammy Crisp, MD;  Location: [facility];  Service: Cardiovascular;  Laterality: N/A;   LEFT HEART CATH AND CORONARY ANGIOGRAPHY N/A 11/06/2017   Procedure: LEFT HEART CATH AND CORONARY ANGIOGRAPHY;  Surgeon: Sammy Crisp, MD;  Location: [facility];  Service: Cardiovascular;  Laterality: N/A;   LEFT HEART CATH AND CORONARY ANGIOGRAPHY N/A 07/24/2019   Procedure: LEFT HEART CATH AND CORONARY ANGIOGRAPHY;  Surgeon: Odie Benne, MD;  Location: [facility];  Service: Cardiovascular;  Laterality: N/A;   RIGHT/LEFT HEART CATH AND CORONARY ANGIOGRAPHY N/A 09/29/2020   Procedure: RIGHT/LEFT HEART CATH AND CORONARY ANGIOGRAPHY;  Surgeon: Arty Binning, MD;  Location: [facility];  Service: Cardiovascular;  Laterality: N/A;   ROBOTIC ASSISTED BILATERAL SALPINGO OOPHERECTOMY Bilateral 11/10/2020   Procedure: XI ROBOTIC ASSISTED BILATERAL SALPINGO OOPHORECTOMY WITH MINI LAPAROTOMY FOR DRAINAGE;  Surgeon: Suzi Essex, MD;  Location: [facility];  Service: Gynecology;  Laterality: Bilateral;  MINI LAP FIRST   TONSILLECTOMY     VULVECTOMY N/A 11/10/2020  Procedure: WIDE EXCISION VULVECTOMY;  Surgeon: Suzi Essex, MD;  Location: [facility];  Service: Gynecology;  Laterality: N/A;   Patient Active Problem List   Diagnosis Date Noted   Weakness 10/05/2023   Bronchopneumonia 10/05/2023   Multifocal pneumonia 09/29/2023   Hypokalemia 08/07/2023   Intertrigo 04/18/2023   Venous stasis dermatitis of both lower extremities 11/28/2022   Ascending aorta dilation (HCC) 09/13/2022   Dizziness 01/19/2022   Frequent falls 01/19/2022   Tremor of both hands 08/16/2021   Aortic atherosclerosis (HCC) 06/15/2021   Generalized osteoarthritis of multiple sites 12/25/2020   Chronic pain disorder 12/25/2020   Pelvic mass in  female    Bilateral lower extremity edema 04/13/2020   Shortness of breath    Anxiety disorder 10/29/2018   CKD (chronic kidney disease), stage III (HCC) 06/27/2018   COPD (chronic obstructive pulmonary disease) (HCC) 05/25/2018   Obesity 03/20/2018   Dyspnea on exertion 11/06/2017   Abnormal stress test 11/06/2017   Chronic heart failure with preserved ejection fraction (HCC) 09/04/2017   Left ventricular dysfunction 07/31/2017   Cataract, nuclear sclerotic senile, bilateral 02/01/2017   Gouty arthritis of toe of left foot 08/09/2016   Hyperuricemia 08/09/2016   Osteoarthritis (arthritis due to wear and tear of joints) 01/14/2014   Atherosclerosis of native coronary artery of native heart with angina pectoris (HCC) 08/02/2011   Mitral regurgitation 08/02/2011   OVERACTIVE BLADDER 07/02/2010   ACUTE CYSTITIS 05/25/2010   Mixed hyperlipidemia 08/11/2009   Leg edema, left 07/22/2009   GERD 04/11/2008   Constipation 04/11/2008   Essential hypertension 05/30/2007   MYOCARDIAL INFARCTION, HX OF 05/30/2007   Allergic rhinitis 05/30/2007   LOW BACK PAIN 05/30/2007    PCP: [place], [name], MD   REFERRING PROVIDER: [place], [name], MD  REFERRING DIAG: R29.6 (ICD-10-CM) - Frequent falls M54.50,G89.29 (ICD-10-CM) - Chronic low back pain without sciatica, unspecified back pain laterality  THERAPY DIAG:  Unsteadiness on feet  Muscle weakness (generalized)  Other low back pain  Rationale for Evaluation and Treatment: Rehabilitation  ONSET DATE: chronic  SUBJECTIVE:   SUBJECTIVE STATEMENT: Just fatigued following last session  PERTINENT HISTORY: Chronic low back pain without sciatica, unspecified back pain laterality Assessment & Plan: No many options for pain management. Tramadol  helped, so we decided to continue. PT will be arranged. Side effects discussed. PDMP reviewed. Will sign med contract next visit.   Orders: -     Ambulatory referral to Physical Therapy -      traMADol  HCl; Take 0.5 tablets (25 mg total) by mouth every 12 (twelve) hours as needed.  Dispense: 30 tablet; Refill: 1 PAIN:  Are you having pain? Yes: NPRS scale: 10/10 Pain location: low back Pain description: ache Aggravating factors: weather, housekeeping, prolonged position Relieving factors: medication, heat, ice  PRECAUTIONS: Fall  RED FLAGS: None   WEIGHT BEARING RESTRICTIONS: No  FALLS:  Has patient fallen in last 6 months? No  OCCUPATION: retired  PLOF: Independent with household mobility with device  PATIENT GOALS: To get around better  NEXT MD VISIT: TBD  OBJECTIVE:  Note: Objective measures were completed at Evaluation unless otherwise noted.  DIAGNOSTIC FINDINGS: none  PATIENT SURVEYS: Patient-specific activity scoring scheme (Point to one number):  "0" represents "unable to perform." "10" represents "able to perform at prior level. 0 1 2 3 4 5 6 7 8 9  10 (Date and Score) Activity Initial  Activity Eval     walking  2    Stair climbing  0  sweeping 1    Total score = sum of the activity scores/number of activities Minimum detectable change (90%CI) for average score = 2 points Minimum detectable change (90%CI) for single activity score = 3 points PSFS developed by: [place] Spitz., & Binkley, J. (1995). Assessing disability and change on individual  patients: a report of a patient specific measure. Physiotherapy [place], 47, [phone]. Reproduced with the permission of the authors  Score: 3/30 10% perceived function  MUSCLE LENGTH: Hamstrings: Right 90 deg; Left 90 deg  POSTURE:  flexed hip posture  PALPATION: N/a  LOWER EXTREMITY ROM: WFL for gait and transfers, ankle mobility limited by chronic edema  AROM Right eval Left eval  Hip flexion    Hip extension    Hip abduction    Hip adduction    Hip internal rotation    Hip external rotation    Knee flexion    Knee extension    Ankle dorsiflexion    Ankle  plantarflexion    Ankle inversion    Ankle eversion     (Blank rows = not tested)  LOWER EXTREMITY MMT:  MMT Right eval Left eval  Hip flexion    Hip extension    Hip abduction    Hip adduction    Hip internal rotation    Hip external rotation    Knee flexion    Knee extension    Ankle dorsiflexion    Ankle plantarflexion    Ankle inversion    Ankle eversion     (Blank rows = not tested)  LOWER EXTREMITY SPECIAL TESTS:  N/A  FUNCTIONAL TESTS:  30 seconds chair stand test 5 reps  GAIT: Distance walked: 254ft Assistive device utilized: Environmental consultant - 4 wheeled Level of assistance: Modified independence Comments: slow cadence and flexed posture                                                                                                                                TREATMENT DATE:  [facility] Adult PT Treatment:                                                DATE: 02/19/24 Therapeutic Exercise: Nustep L2 8 min Neuromuscular re-ed: Seated heel/toe 15x2 using RB Seated FAQs 15x2 B Seated heel slides over towel 15x2 B Therapeutic Activity: Standing marching 15/15 Standing heel raises 15x Standing toe raises 15x   [facility] Adult PT Treatment:                                                DATE: 02/14/24 Therapeutic Exercise: Nustep L2 8 min Neuromuscular re-ed: Seated heel/toe 15x2 Seated FAQs 15x2  B Tandem stance on 4 in block 5/5 in walker no UE support Therapeutic Activity: Standing marching 15/15 Standing heel raises 15x Standing toe raises 15x Standing ham curls 15x B  [facility] Adult PT Treatment:                                                DATE: 02/12/24 Therapeutic Exercise: Nustep L4 8 min Neuromuscular re-ed:   02/12/24 0001  Berg Balance Test  Sit to Stand 3  Standing Unsupported 4  Sitting with Back Unsupported but Feet Supported on Floor or Stool 4  Stand to Sit 4  Transfers 3  Standing Unsupported with Eyes Closed 4  Standing Unsupported with Feet  Together 3  From Standing, Reach Forward with Outstretched Arm 3  From Standing Position, Pick up Object from Floor 3  From Standing Position, Turn to Look Behind Over each Shoulder 2  Turn 360 Degrees 1  Standing Unsupported, Alternately Place Feet on Step/Stool 0  Standing Unsupported, One Foot in Front 2  Standing on One Leg 0  Total Score 36   Therapeutic Activity: Seated FAQs 15/15 2s hold Standing heel raises 15x with support Standing hamstring curls with support 15x B  [facility] Adult PT Treatment:                                                DATE: 01/31/24 Eval and HEP Self Care: Additional minutes spent for educating on updated Therapeutic Home Exercise Program as well as comparing current status to condition at start of symptoms. This included exercises focusing on stretching, strengthening, with focus on eccentric aspects. Long term goals include an improvement in range of motion, strength, endurance as well as avoiding reinjury. Patient's frequency would include in 1-2 times a day, 3-5 times a week for a duration of 6-12 weeks. Proper technique shown and discussed handout in great detail. All questions were discussed and addressed.      PATIENT EDUCATION:  Education details: Discussed eval findings, rehab rationale and POC and patient is in agreement  Person educated: Patient Education method: Explanation Education comprehension: verbalized understanding and needs further education  HOME EXERCISE PROGRAM: Access Code: [email or web address] URL: [email or web address] Date: 01/31/2024 Prepared by: Gretta Leavens  Exercises - Seated Long Arc Quad  - 2-3 x daily - 5 x weekly - 2 sets - 10 reps - Seated Heel Toe Raises  - 2-3 x daily - 5 x weekly - 2 sets - 10 reps - Standing Heel Raise with Support  - 2-3 x daily - 5 x weekly - 2 sets - 10 reps - Sit to Stand with Counter Support  - 2-3 x daily - 5 x weekly - 1 sets - 5 reps  ASSESSMENT:  CLINICAL IMPRESSION:  Continued  to address mobility deficits.  Increased resistance on aerobic work and challenged patient with seated tasks to promote ROM and function.  Patient is a 78 y.o. female who was seen today for physical therapy evaluation and treatment for physical deconditioning following hospital stay in 09/2023 due to bout of pneumonia.  She has a past medical history of chronic low back pain as well as BLE cellulitis and edema which limits mobility.  Sensory loss in LE's also  impacts mobility and balance, increasing fall risk.  OBJECTIVE IMPAIRMENTS: Abnormal gait, decreased activity tolerance, decreased balance, decreased coordination, decreased endurance, decreased knowledge of condition, decreased knowledge of use of DME, decreased mobility, difficulty walking, decreased strength, impaired sensation, improper body mechanics, obesity, and pain.   ACTIVITY LIMITATIONS: carrying, lifting, bending, sitting, standing, squatting, stairs, and transfers  PERSONAL FACTORS: Age, Fitness, Past/current experiences, and 1-2 comorbidities: chronic low back pain and BLE cellulitis  are also affecting patient's functional outcome.   REHAB POTENTIAL: Fair based on chronicity and co-morbidities  CLINICAL DECISION MAKING: Evolving/moderate complexity  EVALUATION COMPLEXITY: Low   GOALS: Goals reviewed with patient? No  SHORT TERM GOALS: Target date: 02/21/2024   Patient to demonstrate independence in HEP  Baseline: [id number] Goal status: INITIAL  2.  Assess BERG to establish static balance baseline. Baseline: TBD; 02/12/24 BERG complete Goal status: Met   LONG TERM GOALS: Target date: 03/13/2024  Patient will increase 30s chair stand reps from 5 to 8 with/without arms to demonstrate and improved functional ability with less pain/difficulty as well as reduce fall risk.  Baseline: 5 Goal status: INITIAL  2.  Patient will score at least 9/30 on PSFS to signify clinically meaningful improvement in functional abilities.    Baseline: 3/10 Goal status: INITIAL  3.  Patient will acknowledge 8/10 pain at least once during episode of care   Baseline: 10/10 Goal status: INITIAL  4.  Perform 2 MWT to assess progress in strength and endurance. Baseline: 253ft Goal status: INITIAL  5.  Perform BERG to assess progress in balance/stability. Baseline: TBD Goal status: INITIAL    PLAN:  PT FREQUENCY: 2x/week  PT DURATION: 6 weeks  PLANNED INTERVENTIONS: 97164- PT Re-evaluation, 97110-Therapeutic exercises, 97530- Therapeutic activity, 97112- Neuromuscular re-education, 97535- Self Care, 54098- Manual therapy, and 97116- Gait training  PLAN FOR NEXT SESSION: HEP review and update, manual techniques as appropriate, aerobic tasks, ROM and flexibility activities, strengthening and PREs, TPDN, gait and balance training as needed     Eldon Greenland, PT 02/19/2024, 12:33 PM

## 2024-02-21 ENCOUNTER — Ambulatory Visit: Admitting: Physical Therapy

## 2024-02-21 ENCOUNTER — Encounter: Payer: Self-pay | Admitting: Physical Therapy

## 2024-02-21 DIAGNOSIS — M5459 Other low back pain: Secondary | ICD-10-CM

## 2024-02-21 DIAGNOSIS — M6281 Muscle weakness (generalized): Secondary | ICD-10-CM | POA: Diagnosis not present

## 2024-02-21 DIAGNOSIS — R2681 Unsteadiness on feet: Secondary | ICD-10-CM | POA: Diagnosis not present

## 2024-02-21 NOTE — Therapy (Signed)
 OUTPATIENT PHYSICAL THERAPY TREATMENT NOTE   Patient Name: Tracy [name] MRN: [id number] DOB:1946-02-26, 78 y.o., female Today's Date: 02/21/2024  END OF SESSION:  PT End of Session - 02/21/24 1047     Visit Number 5    Number of Visits 12    Date for PT Re-Evaluation 04/01/24    Authorization Type MCR    Progress Note Due on Visit 10    PT Start Time 1047    PT Stop Time 1128    PT Time Calculation (min) 41 min    Activity Tolerance Patient tolerated treatment well;Patient limited by fatigue                 Past Medical History:  Diagnosis Date   Allergic rhinitis    Anxiety    Arthritis    Ascending aorta dilation (HCC) 09/13/2022   Echo 09/12/2022: EF 50-55, GR 1 DD, mildly reduced RVSF, normal PASP (RVSP 17.7), mild dilation of ascending aorta (38 mm), RAP 3   Breast cancer (HCC) 04/28/2020   rigtht breast   Bronchitis    CAD (coronary artery disease)    1/19 PCI/DES to pLCX for ISR, normal EF.    Cataract    Cervical dysplasia    unsure of procedure, possible "burning" in her late 29s   CHF (congestive heart failure) (HCC)    Echo 06/2019: EF 55-60, elevated LVEDP, normal RV SF, mild MAC, mild MR, trivial TR   Complication of anesthesia    COPD (chronic obstructive pulmonary disease) (HCC)    early    Depression    GERD (gastroesophageal reflux disease)    Gout    Hyperlipidemia    Hypertension    Low back pain    Overactive bladder    PONV (postoperative nausea and vomiting)    Sleep apnea    Past Surgical History:  Procedure Laterality Date   ANGIOPLASTY     stent 2007   BREAST EXCISIONAL BIOPSY Right 04/2020   ADH/LCIS   BREAST LUMPECTOMY WITH RADIOACTIVE SEED LOCALIZATION Right 04/28/2020   Procedure: RIGHT BREAST LUMPECTOMY X 2  WITH RADIOACTIVE SEED LOCALIZATION;  Surgeon: Dareen Ebbing, MD;  Location: [facility];  Service: General;  Laterality: Right;  LMA   CARDIAC CATHETERIZATION     COLONOSCOPY     CORONARY  STENT INTERVENTION N/A 11/06/2017   Procedure: CORONARY STENT INTERVENTION;  Surgeon: Sammy Crisp, MD;  [facility];  Service: Cardiovascular;  Laterality: N/A;   LEFT HEART CATH AND CORONARY ANGIOGRAPHY N/A 11/06/2017   Procedure: LEFT HEART CATH AND CORONARY ANGIOGRAPHY;  Surgeon: Sammy Crisp, MD;  [facility];  Service: Cardiovascular;  Laterality: N/A;   LEFT HEART CATH AND CORONARY ANGIOGRAPHY N/A 07/24/2019   Procedure: LEFT HEART CATH AND CORONARY ANGIOGRAPHY;  Surgeon: Odie Benne, MD;  [facility];  Service: Cardiovascular;  Laterality: N/A;   RIGHT/LEFT HEART CATH AND CORONARY ANGIOGRAPHY N/A 09/29/2020   Procedure: RIGHT/LEFT HEART CATH AND CORONARY ANGIOGRAPHY;  Surgeon: Arty Binning, MD;  [facility];  Service: Cardiovascular;  Laterality: N/A;   ROBOTIC ASSISTED BILATERAL SALPINGO OOPHERECTOMY Bilateral 11/10/2020   Procedure: XI ROBOTIC ASSISTED BILATERAL SALPINGO OOPHORECTOMY WITH MINI LAPAROTOMY FOR DRAINAGE;  Surgeon: Suzi Essex, MD;  Location: [facility];  Service: Gynecology;  Laterality: Bilateral;  MINI LAP FIRST   TONSILLECTOMY     VULVECTOMY N/A 11/10/2020   Procedure: WIDE EXCISION VULVECTOMY;  Surgeon: Wiley Hanger  R, MD;  Location: [facility];  Service: Gynecology;  Laterality: N/A;   Patient Active Problem List   Diagnosis Date Noted   Weakness 10/05/2023   Bronchopneumonia 10/05/2023   Multifocal pneumonia 09/29/2023   Hypokalemia 08/07/2023   Intertrigo 04/18/2023   Venous stasis dermatitis of both lower extremities 11/28/2022   Ascending aorta dilation (HCC) 09/13/2022   Dizziness 01/19/2022   Frequent falls 01/19/2022   Tremor of both hands 08/16/2021   Aortic atherosclerosis (HCC) 06/15/2021   Generalized osteoarthritis of multiple sites 12/25/2020   Chronic pain disorder 12/25/2020   Pelvic mass in female    Bilateral lower extremity edema 04/13/2020    Shortness of breath    Anxiety disorder 10/29/2018   CKD (chronic kidney disease), stage III (HCC) 06/27/2018   COPD (chronic obstructive pulmonary disease) (HCC) 05/25/2018   Obesity 03/20/2018   Dyspnea on exertion 11/06/2017   Abnormal stress test 11/06/2017   Chronic heart failure with preserved ejection fraction (HCC) 09/04/2017   Left ventricular dysfunction 07/31/2017   Cataract, nuclear sclerotic senile, bilateral 02/01/2017   Gouty arthritis of toe of left foot 08/09/2016   Hyperuricemia 08/09/2016   Osteoarthritis (arthritis due to wear and tear of joints) 01/14/2014   Atherosclerosis of native coronary artery of native heart with angina pectoris (HCC) 08/02/2011   Mitral regurgitation 08/02/2011   OVERACTIVE BLADDER 07/02/2010   ACUTE CYSTITIS 05/25/2010   Mixed hyperlipidemia 08/11/2009   Leg edema, left 07/22/2009   GERD 04/11/2008   Constipation 04/11/2008   Essential hypertension 05/30/2007   MYOCARDIAL INFARCTION, HX OF 05/30/2007   Allergic rhinitis 05/30/2007   LOW BACK PAIN 05/30/2007    PCP: [place], [name], MD   REFERRING PROVIDER: [place], [name], MD  REFERRING DIAG: R29.6 (ICD-10-CM) - Frequent falls M54.50,G89.29 (ICD-10-CM) - Chronic low back pain without sciatica, unspecified back pain laterality  THERAPY DIAG:  Unsteadiness on feet  Muscle weakness (generalized)  Other low back pain  Rationale for Evaluation and Treatment: Rehabilitation  ONSET DATE: chronic  SUBJECTIVE:   SUBJECTIVE STATEMENT:  02/21/2024 Pt reports fatigue about the same as usual after last session. Reports a bit more pain which she attributes to weather changes. States she is planning to get in touch with PCP about her LE wound, voices some concern about its progression - denies fevers/chills or constitutional symptoms (education on red flags is provided)   PERTINENT HISTORY: Chronic low back pain without sciatica, unspecified back pain laterality Assessment & Plan: No  many options for pain management. Tramadol  helped, so we decided to continue. PT will be arranged. Side effects discussed. PDMP reviewed. Will sign med contract next visit.   Orders: -     Ambulatory referral to Physical Therapy -     traMADol  HCl; Take 0.5 tablets (25 mg total) by mouth every 12 (twelve) hours as needed.  Dispense: 30 tablet; Refill: 1 PAIN:  Are you having pain? Yes: NPRS scale: 10/10 Pain location: low back Pain description: ache Aggravating factors: weather, housekeeping, prolonged position Relieving factors: medication, heat, ice  PRECAUTIONS: Fall  RED FLAGS: None   WEIGHT BEARING RESTRICTIONS: No  FALLS:  Has patient fallen in last 6 months? No  OCCUPATION: retired  PLOF: Independent with household mobility with device  PATIENT GOALS: To get around better  NEXT MD VISIT: TBD  OBJECTIVE:  Note: Objective measures were completed at Evaluation unless otherwise noted.  DIAGNOSTIC FINDINGS: none  PATIENT SURVEYS: Patient-specific activity scoring scheme (Point to one number):  "0" represents "unable  to perform." "10" represents "able to perform at prior level. 0 1 2 3 4 5 6 7 8 9  10 (Date and Score) Activity Initial  Activity Eval     walking  2    Stair climbing  0    sweeping 1    Total score = sum of the activity scores/number of activities Minimum detectable change (90%CI) for average score = 2 points Minimum detectable change (90%CI) for single activity score = 3 points PSFS developed by: [place] Spitz., & Binkley, J. (1995). Assessing disability and change on individual  patients: a report of a patient specific measure. Physiotherapy [place], [phone], [phone]. Reproduced with the permission of the authors  Score: 3/30 10% perceived function  MUSCLE LENGTH: Hamstrings: Right 90 deg; Left 90 deg  POSTURE:  flexed hip posture  PALPATION: N/a  LOWER EXTREMITY ROM: WFL for gait and transfers, ankle mobility  limited by chronic edema  AROM Right eval Left eval  Hip flexion    Hip extension    Hip abduction    Hip adduction    Hip internal rotation    Hip external rotation    Knee flexion    Knee extension    Ankle dorsiflexion    Ankle plantarflexion    Ankle inversion    Ankle eversion     (Blank rows = not tested)  LOWER EXTREMITY MMT:  MMT Right eval Left eval  Hip flexion    Hip extension    Hip abduction    Hip adduction    Hip internal rotation    Hip external rotation    Knee flexion    Knee extension    Ankle dorsiflexion    Ankle plantarflexion    Ankle inversion    Ankle eversion     (Blank rows = not tested)  LOWER EXTREMITY SPECIAL TESTS:  N/A  FUNCTIONAL TESTS:  30 seconds chair stand test 5 reps  GAIT: Distance walked: 245ft Assistive device utilized: Environmental consultant - 4 wheeled Level of assistance: Modified independence Comments: slow cadence and flexed posture                                                                                                                                TREATMENT DATE:  [facility] Adult PT Treatment:                                                DATE: 02/21/24 Therapeutic Exercise: Nu step L2-L4, LE/UE 8 min during subjective  Seated red band LAQ x12 cues for setup (around foot rather than shin given wound) Seated heel/toe raises x20 Lateral stepping at counter weaning UE support 3 laps cues for posture and pacing        [facility] Adult PT Treatment:  DATE: 02/19/24 Therapeutic Exercise: Nustep L2 8 min Neuromuscular re-ed: Seated heel/toe 15x2 using RB Seated FAQs 15x2 B Seated heel slides over towel 15x2 B Therapeutic Activity: Standing marching 15/15 Standing heel raises 15x Standing toe raises 15x   [facility] Adult PT Treatment:                                                DATE: 02/14/24 Therapeutic Exercise: Nustep L2 8 min Neuromuscular re-ed: Seated heel/toe  15x2 Seated FAQs 15x2 B Tandem stance on 4 in block 5/5 in walker no UE support Therapeutic Activity: Standing marching 15/15 Standing heel raises 15x Standing toe raises 15x Standing ham curls 15x B   PATIENT EDUCATION:  Education details: rationale for interventions, HEP  Person educated: Patient Education method: Explanation, Demonstration, Tactile cues, Verbal cues Education comprehension: verbalized understanding, returned demonstration, verbal cues required, tactile cues required, and needs further education     HOME EXERCISE PROGRAM: Access Code: [email or web address] URL: [email or web address] Date: 01/31/2024 Prepared by: Gretta Leavens  Exercises - Seated Long Arc Quad  - 2-3 x daily - 5 x weekly - 2 sets - 10 reps - Seated Heel Toe Raises  - 2-3 x daily - 5 x weekly - 2 sets - 10 reps - Standing Heel Raise with Support  - 2-3 x daily - 5 x weekly - 2 sets - 10 reps - Sit to Stand with Counter Support  - 2-3 x daily - 5 x weekly - 1 sets - 5 reps  ASSESSMENT:  CLINICAL IMPRESSION:    02/21/2024 Pt arrives w/ report of continued progress since starting PT. Does voice some concern about her chronic LE wound - denies red flags, education on close monitoring and encouraged following up with PCP. Otherwise continuing to progress program with seated/standing LE strengthening and general conditioning within pt tolerance. No adverse events, pt tolerates session well with cues as above. Recommend continuing along current POC in order to address relevant deficits and improve functional tolerance. Pt departs today's session in no acute distress, all voiced questions/concerns addressed appropriately from PT perspective.    Patient is a 78 y.o. female who was seen today for physical therapy evaluation and treatment for physical deconditioning following hospital stay in 09/2023 due to bout of pneumonia.  She has a past medical history of chronic low back pain as well as BLE cellulitis  and edema which limits mobility.  Sensory loss in LE's also impacts mobility and balance, increasing fall risk.  OBJECTIVE IMPAIRMENTS: Abnormal gait, decreased activity tolerance, decreased balance, decreased coordination, decreased endurance, decreased knowledge of condition, decreased knowledge of use of DME, decreased mobility, difficulty walking, decreased strength, impaired sensation, improper body mechanics, obesity, and pain.   ACTIVITY LIMITATIONS: carrying, lifting, bending, sitting, standing, squatting, stairs, and transfers  PERSONAL FACTORS: Age, Fitness, Past/current experiences, and 1-2 comorbidities: chronic low back pain and BLE cellulitis  are also affecting patient's functional outcome.   REHAB POTENTIAL: Fair based on chronicity and co-morbidities  CLINICAL DECISION MAKING: Evolving/moderate complexity  EVALUATION COMPLEXITY: Low   GOALS: Goals reviewed with patient? No  SHORT TERM GOALS: Target date: 02/21/2024   Patient to demonstrate independence in HEP  Baseline: [id number] Goal status: INITIAL  2.  Assess BERG to establish static balance baseline. Baseline: TBD; 02/12/24 BERG complete Goal status: Met   LONG TERM GOALS: Target date: 03/13/2024  Patient will increase 30s chair stand reps from 5 to 8 with/without arms to demonstrate and improved functional ability with less pain/difficulty as well as reduce fall risk.  Baseline: 5 Goal status: INITIAL  2.  Patient will score at least 9/30 on PSFS to signify clinically meaningful improvement in functional abilities.   Baseline: 3/10 Goal status: INITIAL  3.  Patient will acknowledge 8/10 pain at least once during episode of care   Baseline: 10/10 Goal status: INITIAL  4.  Perform 2 MWT to assess progress in strength and endurance. Baseline: 28ft Goal status: INITIAL  5.  Perform BERG to assess progress in balance/stability. Baseline: TBD Goal status: INITIAL    PLAN:  PT FREQUENCY: 2x/week  PT  DURATION: 6 weeks  PLANNED INTERVENTIONS: 97164- PT Re-evaluation, 97110-Therapeutic exercises, 97530- Therapeutic activity, 97112- Neuromuscular re-education, 97535- Self Care, 16109- Manual therapy, and 97116- Gait training  PLAN FOR NEXT SESSION: HEP review and update, manual techniques as appropriate, aerobic tasks, ROM and flexibility activities, strengthening and PREs, TPDN, gait and balance training as needed     Lovett Ruck PT, DPT 02/21/2024 1:12 PM

## 2024-02-25 ENCOUNTER — Other Ambulatory Visit: Payer: Self-pay

## 2024-02-25 ENCOUNTER — Ambulatory Visit (HOSPITAL_COMMUNITY): Admission: EM | Admit: 2024-02-25 | Discharge: 2024-02-25 | Disposition: A | Discharge status: Home / Self Care

## 2024-02-25 ENCOUNTER — Encounter (HOSPITAL_COMMUNITY): Payer: Self-pay

## 2024-02-25 DIAGNOSIS — I890 Lymphedema, not elsewhere classified: Secondary | ICD-10-CM | POA: Diagnosis not present

## 2024-02-25 MED ORDER — PREDNISONE 20 MG PO TABS: 40.0000 mg | ORAL_TABLET | Freq: Every day | ORAL | 0 refills | Status: AC

## 2024-02-25 NOTE — Discharge Instructions (Signed)
  1. Chronic acquired lymphedema (Primary) - predniSONE  (DELTASONE ) 20 MG tablet; Take 2 tablets (40 mg total) by mouth daily for 5 days.  Dispense: 10 tablet; Refill: 0 - Chronic lymphedema is extremely difficult to treat and requires very intensive outpatient therapy as well as home therapy to relieve symptoms. - Follow-up with PCP for further guidance on how to manage chronic lymphedema. - Recommend follow-up with wound care for ongoing management.  Medical transport may be available to help get you to your appointments in [place] or [place] for lymphedema care. -Continue to monitor symptoms for any change in severity if there is any escalation of current symptoms or development of new symptoms follow-up in ER for further evaluation and management.

## 2024-02-25 NOTE — ED Provider Notes (Signed)
 [facility]  Note:  This document was prepared using Dragon voice recognition software and may include unintentional dictation errors.  MRN: [id number] DOB: 10-May-1946  Subjective:   Tracy [name] is a 78 y.o. female presenting for severe left lower extremity lymphedema with worsening pain over the last few weeks.  Patient reports that she has seen her PCP and wound specialists for lymphedema but they are unable to fix the problem.  Patient has been taking Tylenol  and over-the-counter ointment with minimal improvement.  Patient reports the leg is red, swollen, painful she is having difficulty walking.  Patient needs some relief from chronic lymphedema.  No current facility-administered medications for this encounter.  Current Outpatient Medications:    predniSONE  (DELTASONE ) 20 MG tablet, Take 2 tablets (40 mg total) by mouth daily for 5 days., Disp: 10 tablet, Rfl: 0   acetaminophen  (TYLENOL ) 650 MG CR tablet, Take 1,300 mg by mouth 2 (two) times daily as needed for pain., Disp: , Rfl:    allopurinol  (ZYLOPRIM ) 100 MG tablet, TAKE 1 TABLET BY MOUTH EVERY DAY, Disp: 90 tablet, Rfl: 2   chlorthalidone  (HYGROTON ) 25 MG tablet, TAKE 1 TABLET (25 MG TOTAL) BY MOUTH DAILY., Disp: 90 tablet, Rfl: 2   clopidogrel  (PLAVIX ) 75 MG tablet, TAKE 1 TABLET BY MOUTH EVERY DAY WITH BREAKFAST, Disp: 90 tablet, Rfl: 2   estradiol  (ESTRACE  VAGINAL) 0.1 MG/GM vaginal cream, Apply a pea sized amount just inside the vagina nightly for 2 weeks, then twice weekly thereafter (Patient taking differently: Place 1 Applicatorful vaginally at bedtime. Apply a pea sized amount just inside the vagina nightly for 2 weeks, then twice weekly thereafter), Disp: 42.5 g, Rfl: 12   ezetimibe  (ZETIA ) 10 MG tablet, Take 1 tablet (10 mg total) by mouth daily., Disp: 90 tablet, Rfl: 3   furosemide  (LASIX ) 20 MG tablet, TAKE 1 TABLET BY MOUTH EVERY DAY AS NEEDED, Disp: 90 tablet, Rfl: 2   isosorbide  mononitrate  (IMDUR ) 30 MG 24 hr tablet, TAKE 1 TABLET BY MOUTH EVERY DAY, Disp: 90 tablet, Rfl: 3   losartan  (COZAAR ) 100 MG tablet, Take 1 tablet (100 mg total) by mouth daily., Disp: 90 tablet, Rfl: 3   metoprolol  succinate (TOPROL -XL) 100 MG 24 hr tablet, TAKE 1 TABLET BY MOUTH EVERY DAY WITH OR IMMEDIATELY FOLLOWING A MEAL, Disp: 90 tablet, Rfl: 3   nitroGLYCERIN  (NITROSTAT ) 0.4 MG SL tablet, PLACE 1 TABLET UNDER THE TONGUE EVERY 5 MINUTES AS NEEDED FOR CHEST PAIN, Disp: 25 tablet, Rfl: 6   pantoprazole  (PROTONIX ) 40 MG tablet, TAKE 1 TABLET BY MOUTH EVERY DAY, Disp: 90 tablet, Rfl: 2   pravastatin  (PRAVACHOL ) 40 MG tablet, TAKE 1 TABLET BY MOUTH EVERY DAY IN THE EVENING, Disp: 90 tablet, Rfl: 3   spironolactone  (ALDACTONE ) 25 MG tablet, TAKE 1/2 TABLET BY MOUTH EVERY DAY, Disp: 45 tablet, Rfl: 1   traMADol  (ULTRAM ) 50 MG tablet, Take 0.5 tablets (25 mg total) by mouth every 12 (twelve) hours as needed., Disp: 30 tablet, Rfl: 1   triamcinolone  cream (KENALOG ) 0.1 %, Apply topically daily as needed (for leg dermatitis.)., Disp: 30 g, Rfl: 1   Allergies  Allergen Reactions   Ak-Mycin [Erythromycin] Rash   Sulfamethoxazole Rash    Past Medical History:  Diagnosis Date   Allergic rhinitis    Anxiety    Arthritis    Ascending aorta dilation (HCC) 09/13/2022   Echo 09/12/2022: EF 50-55, GR 1 DD, mildly reduced RVSF, normal PASP (RVSP 17.7), mild dilation of ascending  aorta (38 mm), RAP 3   Breast cancer (HCC) 04/28/2020   rigtht breast   Bronchitis    CAD (coronary artery disease)    1/19 PCI/DES to pLCX for ISR, normal EF.    Cataract    Cervical dysplasia    unsure of procedure, possible "burning" in her late 73s   CHF (congestive heart failure) (HCC)    Echo 06/2019: EF 55-60, elevated LVEDP, normal RV SF, mild MAC, mild MR, trivial TR   Complication of anesthesia    COPD (chronic obstructive pulmonary disease) (HCC)    early    Depression    GERD (gastroesophageal reflux disease)    Gout     Hyperlipidemia    Hypertension    Low back pain    Overactive bladder    PONV (postoperative nausea and vomiting)    Sleep apnea      Past Surgical History:  Procedure Laterality Date   ANGIOPLASTY     stent 2007   BREAST EXCISIONAL BIOPSY Right 04/2020   ADH/LCIS   BREAST LUMPECTOMY WITH RADIOACTIVE SEED LOCALIZATION Right 04/28/2020   Procedure: RIGHT BREAST LUMPECTOMY X 2  WITH RADIOACTIVE SEED LOCALIZATION;  Surgeon: Dareen Ebbing, MD;  Location:  S[facility];  Service: General;  Laterality: Right;  LMA   CARDIAC CATHETERIZATION     COLONOSCOPY     CORONARY STENT INTERVENTION N/A 11/06/2017   Procedure: CORONARY STENT INTERVENTION;  Surgeon: Sammy Crisp, MD;  [facility];  Service: Cardiovascular;  Laterality: N/A;   LEFT HEART CATH AND CORONARY ANGIOGRAPHY N/A 11/06/2017   Procedure: LEFT HEART CATH AND CORONARY ANGIOGRAPHY;  Surgeon: Sammy Crisp, MD;  [facility];  Service: Cardiovascular;  Laterality: N/A;   LEFT HEART CATH AND CORONARY ANGIOGRAPHY N/A 07/24/2019   Procedure: LEFT HEART CATH AND CORONARY ANGIOGRAPHY;  Surgeon: Odie Benne, MD;  [facility];  Service: Cardiovascular;  Laterality: N/A;   RIGHT/LEFT HEART CATH AND CORONARY ANGIOGRAPHY N/A 09/29/2020   Procedure: RIGHT/LEFT HEART CATH AND CORONARY ANGIOGRAPHY;  Surgeon: Arty Binning, MD;  [facility];  Service: Cardiovascular;  Laterality: N/A;   ROBOTIC ASSISTED BILATERAL SALPINGO OOPHERECTOMY Bilateral 11/10/2020   Procedure: XI ROBOTIC ASSISTED BILATERAL SALPINGO OOPHORECTOMY WITH MINI LAPAROTOMY FOR DRAINAGE;  Surgeon: Tracy Essex, MD;  Location: [facility];  Service: Gynecology;  Laterality: Bilateral;  MINI LAP FIRST   TONSILLECTOMY     VULVECTOMY N/A 11/10/2020   Procedure: WIDE EXCISION VULVECTOMY;  Surgeon: Tracy Essex, MD;  Location: [facility];  Service: Gynecology;  Laterality: N/A;     Family History  Adopted: Yes  Problem Relation Age of Onset   Colon cancer Mother 61   Cancer Mother        colon   Depression Mother        Suicide.   Alzheimer's disease Maternal Uncle    Diabetes Daughter        type 2   Other Other        patient is adopted   Stomach cancer Neg Hx     Social History   Tobacco Use   Smoking status: Former    Current packs/day: 0.00    Average packs/day: 2.0 packs/day for 52.0 years (104.0 ttl pk-yrs)    Types: Cigarettes    Start date: 56    Quit date: 2018    Years since quitting: 7.3   Smokeless tobacco: Never   Tobacco comments:  completely quit May of 2018; period of years she did not smoke   Vaping Use   Vaping status: Never Used  Substance Use Topics   Alcohol use: Yes    Comment: seldom   Drug use: No    ROS Refer to HPI for ROS details.  Objective:   Vitals: BP 139/70 (BP Location: Right Arm)   Pulse (!) 102   Temp (!) 97.4 F (36.3 C) (Oral)   Resp 20   SpO2 96%   Physical Exam Vitals and nursing note reviewed.  Constitutional:      General: She is not in acute distress.    Appearance: She is well-developed. She is not ill-appearing or toxic-appearing.  HENT:     Head: Normocephalic and atraumatic.  Cardiovascular:     Rate and Rhythm: Normal rate.  Pulmonary:     Effort: Pulmonary effort is normal. No respiratory distress.  Musculoskeletal:     Right lower leg: No bony tenderness. 2+ Pitting Edema present.     Left lower leg: Tenderness present. No bony tenderness. 4+ Pitting Edema present.  Skin:    General: Skin is warm and dry.  Neurological:     General: No focal deficit present.     Mental Status: She is alert and oriented to person, place, and time.  Psychiatric:        Mood and Affect: Mood normal.        Behavior: Behavior normal.     Procedures  No results found. However, due to the size of the patient record, not all encounters were searched. Please check Results Review for  a complete set of results.  No results found.   Assessment and Plan :     Discharge Instructions       1. Chronic acquired lymphedema (Primary) - predniSONE  (DELTASONE ) 20 MG tablet; Take 2 tablets (40 mg total) by mouth daily for 5 days.  Dispense: 10 tablet; Refill: 0 - Chronic lymphedema is extremely difficult to treat and requires very intensive outpatient therapy as well as home therapy to relieve symptoms. - Follow-up with PCP for further guidance on how to manage chronic lymphedema. - Recommend follow-up with wound care for ongoing management.  Medical transport may be available to help get you to your appointments in [place] or [place] for lymphedema care. -Continue to monitor symptoms for any change in severity if there is any escalation of current symptoms or development of new symptoms follow-up in ER for further evaluation and management.    Shakthi Scipio B Nijah Tejera   Pattijo Juste, [place], [place] 02/25/24 1728

## 2024-02-25 NOTE — ED Triage Notes (Signed)
 Pt states she's been dealing with LT leg problems for a few weeks. Pt states she has seen her provider and they haven't figured out what is wrong. Pts LT leg is red, swollen, and painful. Pt states it has gotten worse over the last week. Pt states she is getting sores now and has been using OTC ointment on it and takes Tylenol .

## 2024-02-26 ENCOUNTER — Ambulatory Visit: Admitting: Physical Therapy

## 2024-02-26 ENCOUNTER — Encounter: Payer: Self-pay | Admitting: Physical Therapy

## 2024-02-26 DIAGNOSIS — M5459 Other low back pain: Secondary | ICD-10-CM | POA: Diagnosis not present

## 2024-02-26 DIAGNOSIS — R2681 Unsteadiness on feet: Secondary | ICD-10-CM | POA: Diagnosis not present

## 2024-02-26 DIAGNOSIS — M6281 Muscle weakness (generalized): Secondary | ICD-10-CM

## 2024-02-26 NOTE — Therapy (Signed)
 OUTPATIENT PHYSICAL THERAPY TREATMENT NOTE   Patient Name: Tracy [name] MRN: [id number] DOB:July 19, 1946, 78 y.o., female Today's Date: 02/26/2024  END OF SESSION:  PT End of Session - 02/26/24 1033     Visit Number 6    Number of Visits 12    Date for PT Re-Evaluation 04/01/24    Authorization Type MCR    Progress Note Due on Visit 10    PT Start Time 1039    PT Stop Time 1120    PT Time Calculation (min) 41 min    Activity Tolerance Patient tolerated treatment well;Patient limited by fatigue                  Past Medical History:  Diagnosis Date   Allergic rhinitis    Anxiety    Arthritis    Ascending aorta dilation (HCC) 09/13/2022   Echo 09/12/2022: EF 50-55, GR 1 DD, mildly reduced RVSF, normal PASP (RVSP 17.7), mild dilation of ascending aorta (38 mm), RAP 3   Breast cancer (HCC) 04/28/2020   rigtht breast   Bronchitis    CAD (coronary artery disease)    1/19 PCI/DES to pLCX for ISR, normal EF.    Cataract    Cervical dysplasia    unsure of procedure, possible "burning" in her late 10s   CHF (congestive heart failure) (HCC)    Echo 06/2019: EF 55-60, elevated LVEDP, normal RV SF, mild MAC, mild MR, trivial TR   Complication of anesthesia    COPD (chronic obstructive pulmonary disease) (HCC)    early    Depression    GERD (gastroesophageal reflux disease)    Gout    Hyperlipidemia    Hypertension    Low back pain    Overactive bladder    PONV (postoperative nausea and vomiting)    Sleep apnea    Past Surgical History:  Procedure Laterality Date   ANGIOPLASTY     stent 2007   BREAST EXCISIONAL BIOPSY Right 04/2020   ADH/LCIS   BREAST LUMPECTOMY WITH RADIOACTIVE SEED LOCALIZATION Right 04/28/2020   Procedure: RIGHT BREAST LUMPECTOMY X 2  WITH RADIOACTIVE SEED LOCALIZATION;  Surgeon: Dareen Ebbing, MD;  Location: [facility];  Service: General;  Laterality: Right;  LMA   CARDIAC CATHETERIZATION     COLONOSCOPY     CORONARY  STENT INTERVENTION N/A 11/06/2017   Procedure: CORONARY STENT INTERVENTION;  Surgeon: Sammy Crisp, MD;  Location: [facility];  Service: Cardiovascular;  Laterality: N/A;   LEFT HEART CATH AND CORONARY ANGIOGRAPHY N/A 11/06/2017   Procedure: LEFT HEART CATH AND CORONARY ANGIOGRAPHY;  Surgeon: Sammy Crisp, MD;  Location: [facility];  Service: Cardiovascular;  Laterality: N/A;   LEFT HEART CATH AND CORONARY ANGIOGRAPHY N/A 07/24/2019   Procedure: LEFT HEART CATH AND CORONARY ANGIOGRAPHY;  Surgeon: Odie Benne, MD;  Location: [facility];  Service: Cardiovascular;  Laterality: N/A;   RIGHT/LEFT HEART CATH AND CORONARY ANGIOGRAPHY N/A 09/29/2020   Procedure: RIGHT/LEFT HEART CATH AND CORONARY ANGIOGRAPHY;  Surgeon: Arty Binning, MD;  Location: [facility];  Service: Cardiovascular;  Laterality: N/A;   ROBOTIC ASSISTED BILATERAL SALPINGO OOPHERECTOMY Bilateral 11/10/2020   Procedure: XI ROBOTIC ASSISTED BILATERAL SALPINGO OOPHORECTOMY WITH MINI LAPAROTOMY FOR DRAINAGE;  Surgeon: Suzi Essex, MD;  Location: [facility];  Service: Gynecology;  Laterality: Bilateral;  MINI LAP FIRST   TONSILLECTOMY     VULVECTOMY N/A 11/10/2020   Procedure: WIDE EXCISION VULVECTOMY;  Surgeon: Orvil Bland,  Tobe Fort, MD;  Location: [facility];  Service: Gynecology;  Laterality: N/A;   Patient Active Problem List   Diagnosis Date Noted   Weakness 10/05/2023   Bronchopneumonia 10/05/2023   Multifocal pneumonia 09/29/2023   Hypokalemia 08/07/2023   Intertrigo 04/18/2023   Venous stasis dermatitis of both lower extremities 11/28/2022   Ascending aorta dilation (HCC) 09/13/2022   Dizziness 01/19/2022   Frequent falls 01/19/2022   Tremor of both hands 08/16/2021   Aortic atherosclerosis (HCC) 06/15/2021   Generalized osteoarthritis of multiple sites 12/25/2020   Chronic pain disorder 12/25/2020   Pelvic mass in female    Bilateral lower extremity edema 04/13/2020    Shortness of breath    Anxiety disorder 10/29/2018   CKD (chronic kidney disease), stage III (HCC) 06/27/2018   COPD (chronic obstructive pulmonary disease) (HCC) 05/25/2018   Obesity 03/20/2018   Dyspnea on exertion 11/06/2017   Abnormal stress test 11/06/2017   Chronic heart failure with preserved ejection fraction (HCC) 09/04/2017   Left ventricular dysfunction 07/31/2017   Cataract, nuclear sclerotic senile, bilateral 02/01/2017   Gouty arthritis of toe of left foot 08/09/2016   Hyperuricemia 08/09/2016   Osteoarthritis (arthritis due to wear and tear of joints) 01/14/2014   Atherosclerosis of native coronary artery of native heart with angina pectoris (HCC) 08/02/2011   Mitral regurgitation 08/02/2011   OVERACTIVE BLADDER 07/02/2010   ACUTE CYSTITIS 05/25/2010   Mixed hyperlipidemia 08/11/2009   Leg edema, left 07/22/2009   GERD 04/11/2008   Constipation 04/11/2008   Essential hypertension 05/30/2007   MYOCARDIAL INFARCTION, HX OF 05/30/2007   Allergic rhinitis 05/30/2007   LOW BACK PAIN 05/30/2007    PCP: [place], [name], MD   REFERRING PROVIDER: [place], [name], MD  REFERRING DIAG: R29.6 (ICD-10-CM) - Frequent falls M54.50,G89.29 (ICD-10-CM) - Chronic low back pain without sciatica, unspecified back pain laterality  THERAPY DIAG:  Unsteadiness on feet  Muscle weakness (generalized)  Rationale for Evaluation and Treatment: Rehabilitation  ONSET DATE: chronic  SUBJECTIVE:   SUBJECTIVE STATEMENT:  02/26/2024 pt states she went to urgent care for her lymphedema/wound, was placed on steroid and states it seems to be helping. Felt good after last session, mild fatigue. No other new updates    PERTINENT HISTORY: Chronic low back pain without sciatica, unspecified back pain laterality Assessment & Plan: No many options for pain management. Tramadol  helped, so we decided to continue. PT will be arranged. Side effects discussed. PDMP reviewed. Will sign med  contract next visit.   Orders: -     Ambulatory referral to Physical Therapy -     traMADol  HCl; Take 0.5 tablets (25 mg total) by mouth every 12 (twelve) hours as needed.  Dispense: 30 tablet; Refill: 1 PAIN:  Are you having pain? Yes: NPRS scale: 10/10 Pain location: low back Pain description: ache Aggravating factors: weather, housekeeping, prolonged position Relieving factors: medication, heat, ice  PRECAUTIONS: Fall  RED FLAGS: None   WEIGHT BEARING RESTRICTIONS: No  FALLS:  Has patient fallen in last 6 months? No  OCCUPATION: retired  PLOF: Independent with household mobility with device  PATIENT GOALS: To get around better  NEXT MD VISIT: TBD  OBJECTIVE:  Note: Objective measures were completed at Evaluation unless otherwise noted.  DIAGNOSTIC FINDINGS: none  PATIENT SURVEYS: Patient-specific activity scoring scheme (Point to one number):  "0" represents "unable to perform." "10" represents "able to perform at prior level. 0 1 2 3 4 5 6 7 8 9  10 (Date and Score) Activity  Initial  Activity Eval     walking  2    Stair climbing  0    sweeping 1    Total score = sum of the activity scores/number of activities Minimum detectable change (90%CI) for average score = 2 points Minimum detectable change (90%CI) for single activity score = 3 points PSFS developed by: [place] Spitz., & Binkley, J. (1995). Assessing disability and change on individual  patients: a report of a patient specific measure. Physiotherapy [place], 47, [phone]. Reproduced with the permission of the authors  Score: 3/30 10% perceived function  MUSCLE LENGTH: Hamstrings: Right 90 deg; Left 90 deg  POSTURE: flexed hip posture  PALPATION: N/a  LOWER EXTREMITY ROM: WFL for gait and transfers, ankle mobility limited by chronic edema  AROM Right eval Left eval  Hip flexion    Hip extension    Hip abduction    Hip adduction    Hip internal rotation    Hip  external rotation    Knee flexion    Knee extension    Ankle dorsiflexion    Ankle plantarflexion    Ankle inversion    Ankle eversion     (Blank rows = not tested)  LOWER EXTREMITY MMT:  MMT Right eval Left eval  Hip flexion    Hip extension    Hip abduction    Hip adduction    Hip internal rotation    Hip external rotation    Knee flexion    Knee extension    Ankle dorsiflexion    Ankle plantarflexion    Ankle inversion    Ankle eversion     (Blank rows = not tested)  LOWER EXTREMITY SPECIAL TESTS:  N/A  FUNCTIONAL TESTS:  30 seconds chair stand test 5 reps  GAIT: Distance walked: 248ft Assistive device utilized: Environmental consultant - 4 wheeled Level of assistance: Modified independence Comments: slow cadence and flexed posture                                                                                                                                TREATMENT DATE:  [facility] Adult PT Treatment:                                                DATE: 02/26/24 Therapeutic Exercise: Nu step L4 UE/LE  Standing heel raises 2x10 w/ UE support   Therapeutic Activity: STS from raised mat 2x5 cues for pacing and breath control Standing tandem balance x30sec BIL, CGA, trying to wean UE support  Standing slow march 2x5 BIL emphasis on pace/posture, UE support CGA   Self Care: Education + discussion re: continued monitoring of LE, communication w/ providers, possible benefit of inquiring about lymphedema PT    [facility] Adult PT Treatment:  DATE: 02/21/24 Therapeutic Exercise: Nu step L2-L4, LE/UE 8 min during subjective  Seated red band LAQ x12 cues for setup (around foot rather than shin given wound) Seated heel/toe raises x20 Lateral stepping at counter weaning UE support 3 laps cues for posture and pacing        [facility] Adult PT Treatment:                                                DATE: 02/19/24 Therapeutic Exercise: Nustep L2  8 min Neuromuscular re-ed: Seated heel/toe 15x2 using RB Seated FAQs 15x2 B Seated heel slides over towel 15x2 B Therapeutic Activity: Standing marching 15/15 Standing heel raises 15x Standing toe raises 15x   [facility] Adult PT Treatment:                                                DATE: 02/14/24 Therapeutic Exercise: Nustep L2 8 min Neuromuscular re-ed: Seated heel/toe 15x2 Seated FAQs 15x2 B Tandem stance on 4 in block 5/5 in walker no UE support Therapeutic Activity: Standing marching 15/15 Standing heel raises 15x Standing toe raises 15x Standing ham curls 15x B   PATIENT EDUCATION:  Education details: rationale for interventions, HEP  Person educated: Patient Education method: Explanation, Demonstration, Tactile cues, Verbal cues Education comprehension: verbalized understanding, returned demonstration, verbal cues required, tactile cues required, and needs further education     HOME EXERCISE PROGRAM: Access Code: [email or web address] URL: [email or web address] Date: 01/31/2024 Prepared by: Gretta Leavens  Exercises - Seated Long Arc Quad  - 2-3 x daily - 5 x weekly - 2 sets - 10 reps - Seated Heel Toe Raises  - 2-3 x daily - 5 x weekly - 2 sets - 10 reps - Standing Heel Raise with Support  - 2-3 x daily - 5 x weekly - 2 sets - 10 reps - Sit to Stand with Counter Support  - 2-3 x daily - 5 x weekly - 1 sets - 5 reps  ASSESSMENT:  CLINICAL IMPRESSION:    02/26/2024 Pt arrives w/ report of baseline symptoms - followed with urgent care yesterday about her LE and states she feels a bit better with steroids. Today continuing to progress functional strengthening with emphasis on comfortable mechanics and pacing. Continues to demonstrate exertional fatigue that is well controlled with rest breaks, no adverse events and able to spend more time with WB activities today. No adverse events, no increase in pain on departure. Recommend continuing along current POC in order to  address relevant deficits and improve functional tolerance. Pt departs today's session in no acute distress, all voiced questions/concerns addressed appropriately from PT perspective.    Eval - Patient is a 78 y.o. female who was seen today for physical therapy evaluation and treatment for physical deconditioning following hospital stay in 09/2023 due to bout of pneumonia.  She has a past medical history of chronic low back pain as well as BLE cellulitis and edema which limits mobility.  Sensory loss in LE's also impacts mobility and balance, increasing fall risk.  OBJECTIVE IMPAIRMENTS: Abnormal gait, decreased activity tolerance, decreased balance, decreased coordination, decreased endurance, decreased knowledge of condition, decreased knowledge of use of DME, decreased mobility, difficulty walking, decreased strength, impaired sensation,  improper body mechanics, obesity, and pain.   ACTIVITY LIMITATIONS: carrying, lifting, bending, sitting, standing, squatting, stairs, and transfers  PERSONAL FACTORS: Age, Fitness, Past/current experiences, and 1-2 comorbidities: chronic low back pain and BLE cellulitis are also affecting patient's functional outcome.   REHAB POTENTIAL: Fair based on chronicity and co-morbidities  CLINICAL DECISION MAKING: Evolving/moderate complexity  EVALUATION COMPLEXITY: Low   GOALS: Goals reviewed with patient? No  SHORT TERM GOALS: Target date: 02/21/2024   Patient to demonstrate independence in HEP  Baseline: [id number] 02/26/24: reports good adherence w/ HEP Goal status: MET   2.  Assess BERG to establish static balance baseline. Baseline: TBD; 02/12/24 BERG complete Goal status: Met   LONG TERM GOALS: Target date: 03/13/2024  Patient will increase 30s chair stand reps from 5 to 8 with/without arms to demonstrate and improved functional ability with less pain/difficulty as well as reduce fall risk.  Baseline: 5 Goal status: INITIAL  2.  Patient will score at  least 9/30 on PSFS to signify clinically meaningful improvement in functional abilities.   Baseline: 3/10 Goal status: INITIAL  3.  Patient will acknowledge 8/10 pain at least once during episode of care   Baseline: 10/10 Goal status: INITIAL  4.  Perform 2 MWT to assess progress in strength and endurance. Baseline: 213ft Goal status: INITIAL  5.  Perform BERG to assess progress in balance/stability. Baseline: TBD Goal status: INITIAL    PLAN:  PT FREQUENCY: 2x/week  PT DURATION: 6 weeks  PLANNED INTERVENTIONS: 97164- PT Re-evaluation, 97110-Therapeutic exercises, 97530- Therapeutic activity, 97112- Neuromuscular re-education, 97535- Self Care, 62130- Manual therapy, and 97116- Gait training  PLAN FOR NEXT SESSION: HEP review and update, manual techniques as appropriate, aerobic tasks, ROM and flexibility activities, strengthening and PREs, TPDN, gait and balance training as needed     Lovett Ruck PT, DPT 02/26/2024 11:23 AM

## 2024-02-27 ENCOUNTER — Telehealth: Payer: Self-pay

## 2024-02-27 ENCOUNTER — Other Ambulatory Visit: Payer: Self-pay

## 2024-02-27 DIAGNOSIS — R600 Localized edema: Secondary | ICD-10-CM

## 2024-02-27 MED ORDER — FLUCONAZOLE 150 MG PO TABS: 150.0000 mg | ORAL_TABLET | Freq: Once | ORAL | 0 refills | Status: AC

## 2024-02-27 NOTE — Addendum Note (Signed)
 Addended by: Fidencio Hue E on: 02/27/2024 10:59 AM   Modules accepted: Orders

## 2024-02-27 NOTE — Telephone Encounter (Signed)
Referral entered and pt is aware

## 2024-02-27 NOTE — Telephone Encounter (Signed)
 Copied from CRM [id number]. Topic: Referral - Question >> Feb 26, 2024  3:31 PM Alysia Jumbo S wrote: Reason for CRM: Patient states she was advised by her rehab specialist that she can get a lymphoedema rehab referral from provider. Patient declined appointment to discuss referral with provider.  Patient is requesting a callback at [phone].

## 2024-02-27 NOTE — Telephone Encounter (Signed)
 RX diflucan sent for yeast symptoms per protocol

## 2024-02-27 NOTE — Therapy (Signed)
 OUTPATIENT PHYSICAL THERAPY TREATMENT NOTE   Patient Name: Tracy [name] MRN: [id number] DOB:1946-04-04, 78 y.o., female Today's Date: 02/28/2024  END OF SESSION:  PT End of Session - 02/28/24 1045     Visit Number 7    Number of Visits 12    Date for PT Re-Evaluation 04/01/24    Authorization Type MCR    Progress Note Due on Visit 10    PT Start Time 1045    PT Stop Time 1126    PT Time Calculation (min) 41 min    Activity Tolerance Patient tolerated treatment well;Patient limited by fatigue                   Past Medical History:  Diagnosis Date   Allergic rhinitis    Anxiety    Arthritis    Ascending aorta dilation (HCC) 09/13/2022   Echo 09/12/2022: EF 50-55, GR 1 DD, mildly reduced RVSF, normal PASP (RVSP 17.7), mild dilation of ascending aorta (38 mm), RAP 3   Breast cancer (HCC) 04/28/2020   rigtht breast   Bronchitis    CAD (coronary artery disease)    1/19 PCI/DES to pLCX for ISR, normal EF.    Cataract    Cervical dysplasia    unsure of procedure, possible "burning" in her late 54s   CHF (congestive heart failure) (HCC)    Echo 06/2019: EF 55-60, elevated LVEDP, normal RV SF, mild MAC, mild MR, trivial TR   Complication of anesthesia    COPD (chronic obstructive pulmonary disease) (HCC)    early    Depression    GERD (gastroesophageal reflux disease)    Gout    Hyperlipidemia    Hypertension    Low back pain    Overactive bladder    PONV (postoperative nausea and vomiting)    Sleep apnea    Past Surgical History:  Procedure Laterality Date   ANGIOPLASTY     stent 2007   BREAST EXCISIONAL BIOPSY Right 04/2020   ADH/LCIS   BREAST LUMPECTOMY WITH RADIOACTIVE SEED LOCALIZATION Right 04/28/2020   Procedure: RIGHT BREAST LUMPECTOMY X 2  WITH RADIOACTIVE SEED LOCALIZATION;  Surgeon: Dareen Ebbing, MD;  Location: [facility];  Service: General;  Laterality: Right;  LMA   CARDIAC CATHETERIZATION     COLONOSCOPY     CORONARY  STENT INTERVENTION N/A 11/06/2017   Procedure: CORONARY STENT INTERVENTION;  Surgeon: Sammy Crisp, MD;  Location: [facility];  Service: Cardiovascular;  Laterality: N/A;   LEFT HEART CATH AND CORONARY ANGIOGRAPHY N/A 11/06/2017   Procedure: LEFT HEART CATH AND CORONARY ANGIOGRAPHY;  Surgeon: Sammy Crisp, MD;  Location: [facility];  Service: Cardiovascular;  Laterality: N/A;   LEFT HEART CATH AND CORONARY ANGIOGRAPHY N/A 07/24/2019   Procedure: LEFT HEART CATH AND CORONARY ANGIOGRAPHY;  Surgeon: Odie Benne, MD;  Location: [facility];  Service: Cardiovascular;  Laterality: N/A;   RIGHT/LEFT HEART CATH AND CORONARY ANGIOGRAPHY N/A 09/29/2020   Procedure: RIGHT/LEFT HEART CATH AND CORONARY ANGIOGRAPHY;  Surgeon: Arty Binning, MD;  Location: [facility];  Service: Cardiovascular;  Laterality: N/A;   ROBOTIC ASSISTED BILATERAL SALPINGO OOPHERECTOMY Bilateral 11/10/2020   Procedure: XI ROBOTIC ASSISTED BILATERAL SALPINGO OOPHORECTOMY WITH MINI LAPAROTOMY FOR DRAINAGE;  Surgeon: Suzi Essex, MD;  Location: [facility];  Service: Gynecology;  Laterality: Bilateral;  MINI LAP FIRST   TONSILLECTOMY     VULVECTOMY N/A 11/10/2020   Procedure: WIDE EXCISION VULVECTOMY;  Surgeon:  Suzi Essex, MD;  Location: [facility];  Service: Gynecology;  Laterality: N/A;   Patient Active Problem List   Diagnosis Date Noted   Weakness 10/05/2023   Bronchopneumonia 10/05/2023   Multifocal pneumonia 09/29/2023   Hypokalemia 08/07/2023   Intertrigo 04/18/2023   Venous stasis dermatitis of both lower extremities 11/28/2022   Ascending aorta dilation (HCC) 09/13/2022   Dizziness 01/19/2022   Frequent falls 01/19/2022   Tremor of both hands 08/16/2021   Aortic atherosclerosis (HCC) 06/15/2021   Generalized osteoarthritis of multiple sites 12/25/2020   Chronic pain disorder 12/25/2020   Pelvic mass in female    Bilateral lower extremity edema 04/13/2020    Shortness of breath    Anxiety disorder 10/29/2018   CKD (chronic kidney disease), stage III (HCC) 06/27/2018   COPD (chronic obstructive pulmonary disease) (HCC) 05/25/2018   Obesity 03/20/2018   Dyspnea on exertion 11/06/2017   Abnormal stress test 11/06/2017   Chronic heart failure with preserved ejection fraction (HCC) 09/04/2017   Left ventricular dysfunction 07/31/2017   Cataract, nuclear sclerotic senile, bilateral 02/01/2017   Gouty arthritis of toe of left foot 08/09/2016   Hyperuricemia 08/09/2016   Osteoarthritis (arthritis due to wear and tear of joints) 01/14/2014   Atherosclerosis of native coronary artery of native heart with angina pectoris (HCC) 08/02/2011   Mitral regurgitation 08/02/2011   OVERACTIVE BLADDER 07/02/2010   ACUTE CYSTITIS 05/25/2010   Mixed hyperlipidemia 08/11/2009   Leg edema, left 07/22/2009   GERD 04/11/2008   Constipation 04/11/2008   Essential hypertension 05/30/2007   MYOCARDIAL INFARCTION, HX OF 05/30/2007   Allergic rhinitis 05/30/2007   LOW BACK PAIN 05/30/2007    PCP: [place], [name], MD   REFERRING PROVIDER: [place], [name], MD  REFERRING DIAG: R29.6 (ICD-10-CM) - Frequent falls M54.50,G89.29 (ICD-10-CM) - Chronic low back pain without sciatica, unspecified back pain laterality  THERAPY DIAG:  Unsteadiness on feet  Muscle weakness (generalized)  Rationale for Evaluation and Treatment: Rehabilitation  ONSET DATE: chronic  SUBJECTIVE:   SUBJECTIVE STATEMENT:  02/28/2024 States she did well after last session. Steroid improving her LLE symptoms. States she got lymphedema referral and is getting evaluated next week. No other new updates.     PERTINENT HISTORY: Chronic low back pain without sciatica, unspecified back pain laterality Assessment & Plan: No many options for pain management. Tramadol  helped, so we decided to continue. PT will be arranged. Side effects discussed. PDMP reviewed. Will sign med contract next  visit.   Orders: -     Ambulatory referral to Physical Therapy -     traMADol  HCl; Take 0.5 tablets (25 mg total) by mouth every 12 (twelve) hours as needed.  Dispense: 30 tablet; Refill: 1 PAIN:  Are you having pain? Yes: NPRS scale: 5/10 Pain location: low back Pain description: ache Aggravating factors: weather, housekeeping, prolonged position Relieving factors: medication, heat, ice  PRECAUTIONS: Fall  RED FLAGS: None   WEIGHT BEARING RESTRICTIONS: No  FALLS:  Has patient fallen in last 6 months? No  OCCUPATION: retired  PLOF: Independent with household mobility with device  PATIENT GOALS: To get around better  NEXT MD VISIT: TBD  OBJECTIVE:  Note: Objective measures were completed at Evaluation unless otherwise noted.  DIAGNOSTIC FINDINGS: none  PATIENT SURVEYS: Patient-specific activity scoring scheme (Point to one number):  "0" represents "unable to perform." "10" represents "able to perform at prior level. 0 1 2 3 4 5 6 7 8 9  10 (Date and Score) Activity Initial  Activity  Eval     walking  2    Stair climbing  0    sweeping 1    Total score = sum of the activity scores/number of activities Minimum detectable change (90%CI) for average score = 2 points Minimum detectable change (90%CI) for single activity score = 3 points PSFS developed by: [place] Spitz., & Binkley, J. (1995). Assessing disability and change on individual  patients: a report of a patient specific measure. Physiotherapy [place], 47, [phone]. Reproduced with the permission of the authors  Score: 3/30 10% perceived function  MUSCLE LENGTH: Hamstrings: Right 90 deg; Left 90 deg  POSTURE: flexed hip posture  PALPATION: N/a  LOWER EXTREMITY ROM: WFL for gait and transfers, ankle mobility limited by chronic edema  AROM Right eval Left eval  Hip flexion    Hip extension    Hip abduction    Hip adduction    Hip internal rotation    Hip external rotation     Knee flexion    Knee extension    Ankle dorsiflexion    Ankle plantarflexion    Ankle inversion    Ankle eversion     (Blank rows = not tested)  LOWER EXTREMITY MMT:  MMT Right eval Left eval  Hip flexion    Hip extension    Hip abduction    Hip adduction    Hip internal rotation    Hip external rotation    Knee flexion    Knee extension    Ankle dorsiflexion    Ankle plantarflexion    Ankle inversion    Ankle eversion     (Blank rows = not tested)  LOWER EXTREMITY SPECIAL TESTS:  N/A  FUNCTIONAL TESTS:  30 seconds chair stand test 5 reps  GAIT: Distance walked: 276ft Assistive device utilized: Environmental consultant - 4 wheeled Level of assistance: Modified independence Comments: slow cadence and flexed posture                                                                                                                                TREATMENT DATE:  [facility] Adult PT Treatment:                                                DATE: 02/28/24 Therapeutic Exercise: Nu step L3LE/UE during subjective  Standing heel raise x12 w UE support Seated LAQ 2x8 BIL HEP update + education  Therapeutic Activity: STS raised mat x8 cues for breath control and positioning  4 inch step tap x10 BIL unilat UE support cues for soft impact, pacing  Standing unresisted hamstring curl 2x5 BIL    [facility] Adult PT Treatment:  DATE: 02/26/24 Therapeutic Exercise: Nu step L4 UE/LE  Standing heel raises 2x10 w/ UE support   Therapeutic Activity: STS from raised mat 2x5 cues for pacing and breath control Standing tandem balance x30sec BIL, CGA, trying to wean UE support  Standing slow march 2x5 BIL emphasis on pace/posture, UE support CGA   Self Care: Education + discussion re: continued monitoring of LE, communication w/ providers, possible benefit of inquiring about lymphedema PT    [facility] Adult PT Treatment:                                                 DATE: 02/21/24 Therapeutic Exercise: Nu step L2-L4, LE/UE 8 min during subjective  Seated red band LAQ x12 cues for setup (around foot rather than shin given wound) Seated heel/toe raises x20 Lateral stepping at counter weaning UE support 3 laps cues for posture and pacing        [facility] Adult PT Treatment:                                                DATE: 02/19/24 Therapeutic Exercise: Nustep L2 8 min Neuromuscular re-ed: Seated heel/toe 15x2 using RB Seated FAQs 15x2 B Seated heel slides over towel 15x2 B Therapeutic Activity: Standing marching 15/15 Standing heel raises 15x Standing toe raises 15x   PATIENT EDUCATION:  Education details: rationale for interventions, HEP  Person educated: Patient Education method: Explanation, Demonstration, Tactile cues, Verbal cues Education comprehension: verbalized understanding, returned demonstration, verbal cues required, tactile cues required, and needs further education     HOME EXERCISE PROGRAM: Access Code: [email or web address] URL: [email or web address] Date: 02/28/2024 Prepared by: Mayme Spearman  Exercises - Seated Long Arc Quad  - 2-3 x daily - 5 x weekly - 2 sets - 10 reps - Standing Heel Raise with Support  - 2-3 x daily - 5 x weekly - 2 sets - 10 reps - Sit to Stand with Counter Support  - 2-3 x daily - 5 x weekly - 1 sets - 5 reps - Standing March with Counter Support  - 2-3 x daily - 5 x weekly - 1 sets - 5 reps - Standing Knee Flexion AROM with Chair Support  - 2-3 x daily - 5 x weekly - 1 sets - 5 reps  ASSESSMENT:  CLINICAL IMPRESSION:    02/28/2024 Pt arrives w/ 5/10 pain, good response to last session. Today continuing to progress time spent w/ closed chain activity with aim of improving functional tolerance. No adverse events, tolerates session well with reduced need for rest breaks compared to prior sessions. HEP update as above. Recommend continuing along current POC in order to address relevant  deficits and improve functional tolerance. Pt departs today's session in no acute distress, all voiced questions/concerns addressed appropriately from PT perspective.      Eval - Patient is a 78 y.o. female who was seen today for physical therapy evaluation and treatment for physical deconditioning following hospital stay in 09/2023 due to bout of pneumonia.  She has a past medical history of chronic low back pain as well as BLE cellulitis and edema which limits mobility.  Sensory loss in LE's also impacts mobility and balance, increasing fall risk.  OBJECTIVE IMPAIRMENTS: Abnormal gait, decreased activity tolerance, decreased balance, decreased coordination, decreased endurance, decreased knowledge of condition, decreased knowledge of use of DME, decreased mobility, difficulty walking, decreased strength, impaired sensation, improper body mechanics, obesity, and pain.   ACTIVITY LIMITATIONS: carrying, lifting, bending, sitting, standing, squatting, stairs, and transfers  PERSONAL FACTORS: Age, Fitness, Past/current experiences, and 1-2 comorbidities: chronic low back pain and BLE cellulitis are also affecting patient's functional outcome.   REHAB POTENTIAL: Fair based on chronicity and co-morbidities  CLINICAL DECISION MAKING: Evolving/moderate complexity  EVALUATION COMPLEXITY: Low   GOALS: Goals reviewed with patient? No  SHORT TERM GOALS: Target date: 02/21/2024   Patient to demonstrate independence in HEP  Baseline: [id number] 02/26/24: reports good adherence w/ HEP Goal status: MET   2.  Assess BERG to establish static balance baseline. Baseline: TBD; 02/12/24 BERG complete Goal status: Met   LONG TERM GOALS: Target date: 03/13/2024  Patient will increase 30s chair stand reps from 5 to 8 with/without arms to demonstrate and improved functional ability with less pain/difficulty as well as reduce fall risk.  Baseline: 5 Goal status: INITIAL  2.  Patient will score at least 9/30  on PSFS to signify clinically meaningful improvement in functional abilities.   Baseline: 3/10 Goal status: INITIAL  3.  Patient will acknowledge 8/10 pain at least once during episode of care   Baseline: 10/10 Goal status: INITIAL  4.  Perform 2 MWT to assess progress in strength and endurance. Baseline: 246ft Goal status: INITIAL  5.  Perform BERG to assess progress in balance/stability. Baseline: TBD Goal status: INITIAL    PLAN:  PT FREQUENCY: 2x/week  PT DURATION: 6 weeks  PLANNED INTERVENTIONS: 97164- PT Re-evaluation, 97110-Therapeutic exercises, 97530- Therapeutic activity, 97112- Neuromuscular re-education, 97535- Self Care, 54098- Manual therapy, and 97116- Gait training  PLAN FOR NEXT SESSION: HEP review and update, manual techniques as appropriate, aerobic tasks, ROM and flexibility activities, strengthening and PREs, TPDN, gait and balance training as needed     Lovett Ruck PT, DPT 02/28/2024 11:31 AM

## 2024-02-27 NOTE — Telephone Encounter (Signed)
 It is Ok, referral can be placed. Thanks, BJ

## 2024-02-28 ENCOUNTER — Ambulatory Visit: Admitting: Physical Therapy

## 2024-02-28 ENCOUNTER — Encounter: Payer: Self-pay | Admitting: Physical Therapy

## 2024-02-28 DIAGNOSIS — M6281 Muscle weakness (generalized): Secondary | ICD-10-CM | POA: Diagnosis not present

## 2024-02-28 DIAGNOSIS — R2681 Unsteadiness on feet: Secondary | ICD-10-CM | POA: Diagnosis not present

## 2024-02-28 DIAGNOSIS — M5459 Other low back pain: Secondary | ICD-10-CM | POA: Diagnosis not present

## 2024-03-01 ENCOUNTER — Other Ambulatory Visit: Payer: Self-pay | Admitting: Family Medicine

## 2024-03-01 DIAGNOSIS — M109 Gout, unspecified: Secondary | ICD-10-CM

## 2024-03-04 ENCOUNTER — Ambulatory Visit

## 2024-03-04 DIAGNOSIS — M6281 Muscle weakness (generalized): Secondary | ICD-10-CM

## 2024-03-04 DIAGNOSIS — R2681 Unsteadiness on feet: Secondary | ICD-10-CM | POA: Diagnosis not present

## 2024-03-04 DIAGNOSIS — M5459 Other low back pain: Secondary | ICD-10-CM | POA: Diagnosis not present

## 2024-03-04 NOTE — Therapy (Signed)
 OUTPATIENT PHYSICAL THERAPY  LOWER EXTREMITY ONCOLOGY EVALUATION  Patient Name: Tracy [name] MRN: [id number] DOB:[birth year], 78 y.o., female Today's Date: 03/05/2024  END OF SESSION:  PT End of Session - 03/05/24 2304     Visit Number 1   lymphedema   Number of Visits 19   lymphedema   Date for PT Re-Evaluation 04/16/24    Authorization Type MCR    PT Start Time 1200    PT Stop Time 1250    PT Time Calculation (min) 50 min    Activity Tolerance Patient tolerated treatment well    Behavior During Therapy WFL for tasks assessed/performed             Past Medical History:  Diagnosis Date   Allergic rhinitis    Anxiety    Arthritis    Ascending aorta dilation (HCC) 09/13/2022   Echo 09/12/2022: EF 50-55, GR 1 DD, mildly reduced RVSF, normal PASP (RVSP 17.7), mild dilation of ascending aorta (38 mm), RAP 3   Breast cancer (HCC) 04/28/2020   rigtht breast   Bronchitis    CAD (coronary artery disease)    1/19 PCI/DES to pLCX for ISR, normal EF.    Cataract    Cervical dysplasia    unsure of procedure, possible "burning" in her late 44s   CHF (congestive heart failure) (HCC)    Echo 06/2019: EF 55-60, elevated LVEDP, normal RV SF, mild MAC, mild MR, trivial TR   Complication of anesthesia    COPD (chronic obstructive pulmonary disease) (HCC)    early    Depression    GERD (gastroesophageal reflux disease)    Gout    Hyperlipidemia    Hypertension    Low back pain    Overactive bladder    PONV (postoperative nausea and vomiting)    Sleep apnea    Past Surgical History:  Procedure Laterality Date   ANGIOPLASTY     stent 2007   BREAST EXCISIONAL BIOPSY Right 04/2020   ADH/LCIS   BREAST LUMPECTOMY WITH RADIOACTIVE SEED LOCALIZATION Right 04/28/2020   Procedure: RIGHT BREAST LUMPECTOMY X 2  WITH RADIOACTIVE SEED LOCALIZATION;  Surgeon: Dareen Ebbing, MD;  Location: [facility];  Service: General;  Laterality: Right;  LMA   CARDIAC  CATHETERIZATION     COLONOSCOPY     CORONARY STENT INTERVENTION N/A 11/06/2017   Procedure: CORONARY STENT INTERVENTION;  Surgeon: Sammy Crisp, MD;  Location: [facility];  Service: Cardiovascular;  Laterality: N/A;   LEFT HEART CATH AND CORONARY ANGIOGRAPHY N/A 11/06/2017   Procedure: LEFT HEART CATH AND CORONARY ANGIOGRAPHY;  Surgeon: Sammy Crisp, MD;  Location: [facility];  Service: Cardiovascular;  Laterality: N/A;   LEFT HEART CATH AND CORONARY ANGIOGRAPHY N/A 07/24/2019   Procedure: LEFT HEART CATH AND CORONARY ANGIOGRAPHY;  Surgeon: Odie Benne, MD;  Location: [facility];  Service: Cardiovascular;  Laterality: N/A;   RIGHT/LEFT HEART CATH AND CORONARY ANGIOGRAPHY N/A 09/29/2020   Procedure: RIGHT/LEFT HEART CATH AND CORONARY ANGIOGRAPHY;  Surgeon: Arty Binning, MD;  Location: [facility];  Service: Cardiovascular;  Laterality: N/A;   ROBOTIC ASSISTED BILATERAL SALPINGO OOPHERECTOMY Bilateral 11/10/2020   Procedure: XI ROBOTIC ASSISTED BILATERAL SALPINGO OOPHORECTOMY WITH MINI LAPAROTOMY FOR DRAINAGE;  Surgeon: Suzi Essex, MD;  Location: [facility];  Service: Gynecology;  Laterality: Bilateral;  MINI LAP FIRST   TONSILLECTOMY     VULVECTOMY N/A 11/10/2020   Procedure: WIDE EXCISION VULVECTOMY;  Surgeon: Wiley Hanger  R, MD;  Location: [facility];  Service: Gynecology;  Laterality: N/A;   Patient Active Problem List   Diagnosis Date Noted   Weakness 10/05/2023   Bronchopneumonia 10/05/2023   Multifocal pneumonia 09/29/2023   Hypokalemia 08/07/2023   Intertrigo 04/18/2023   Venous stasis dermatitis of both lower extremities 11/28/2022   Ascending aorta dilation (HCC) 09/13/2022   Dizziness 01/19/2022   Frequent falls 01/19/2022   Tremor of both hands 08/16/2021   Aortic atherosclerosis (HCC) 06/15/2021   Generalized osteoarthritis of multiple sites 12/25/2020   Chronic pain disorder 12/25/2020   Pelvic mass in female     Bilateral lower extremity edema 04/13/2020   Shortness of breath    Anxiety disorder 10/29/2018   CKD (chronic kidney disease), stage III (HCC) 06/27/2018   COPD (chronic obstructive pulmonary disease) (HCC) 05/25/2018   Obesity 03/20/2018   Dyspnea on exertion 11/06/2017   Abnormal stress test 11/06/2017   Chronic heart failure with preserved ejection fraction (HCC) 09/04/2017   Left ventricular dysfunction 07/31/2017   Cataract, nuclear sclerotic senile, bilateral 02/01/2017   Gouty arthritis of toe of left foot 08/09/2016   Hyperuricemia 08/09/2016   Osteoarthritis (arthritis due to wear and tear of joints) 01/14/2014   Atherosclerosis of native coronary artery of native heart with angina pectoris (HCC) 08/02/2011   Mitral regurgitation 08/02/2011   OVERACTIVE BLADDER 07/02/2010   ACUTE CYSTITIS 05/25/2010   Mixed hyperlipidemia 08/11/2009   Leg edema, left 07/22/2009   GERD 04/11/2008   Constipation 04/11/2008   Essential hypertension 05/30/2007   MYOCARDIAL INFARCTION, HX OF 05/30/2007   Allergic rhinitis 05/30/2007   LOW BACK PAIN 05/30/2007     REFERRING PROVIDER: Betty Swaziland, MD  REFERRING DIAG: I89.0  THERAPY DIAG:  Lymphedema, not elsewhere classified  ONSET DATE: unknown  Rationale for Evaluation and Treatment: Rehabilitation  SUBJECTIVE:                                                                                                                                                                                           SUBJECTIVE STATEMENT: A week ago this past Sunday I went to urgent care due to leg pain.  They put me on steroids.  They said I couldn't have anymore antibiotics.  Reports the legs have been swollen for a long time, Left is always worse than the Right.  I have been doing rehab for the balance but I think they may be done with me soon.  I had improvements when my daughter put unna boots on but they made me itch.  I have always not been able to do  compression.  PERTINENT HISTORY: HTN, CAD, COPD, OA, CKD stage 3, Controlled CHF but does have some soft pitting edema bilaterally. 11/10/20: Robotic BSO and removal of left labial mass and fallopian tube 18cm cystic mass, pathology showed serous cystadenoma and benign hidradenoma papilliferum. Size difference noted even back in 2020 with negative doppler for DVT. Recurrent cellultis in the leg.   PAIN:  Are you having pain? Yes NPRS scale: 7/10 Pain location: mid calf and down   Pain orientation: Left  PAIN TYPE: aching, dull, and tight Pain description: constant  Aggravating factors: pushing on it, sitting still too long  Relieving factors: when I compress it like an unna boot   PRECAUTIONS: possible active infection left leg, Fall risk  RED FLAGS: None   WEIGHT BEARING RESTRICTIONS: No  FALLS:  Has patient fallen in last 6 months? Yes. Number of falls 1  LIVING ENVIRONMENT: Lives with: lives with their family and lives with their spouse Lives in: House/apartment Has following equipment at home: Walker - 4 wheeled and SPC  I don't climb the steps anymore    OCCUPATION: reitred   LEISURE: not addressed   PRIOR LEVEL OF FUNCTION: Independent with basic ADLs  PATIENT GOALS: decrease leg pain and infections   OBJECTIVE: Note: Objective measures were completed at Evaluation unless otherwise noted.  COGNITION: Overall cognitive status: Within functional limits for tasks assessed   PALPATION: +1-2 pitting bil Lower extremities from foot to knee  OBSERVATIONS / OTHER ASSESSMENTS: Lt LE is larger than Rt up to groin.  Rt leg has edema evident mostly around the ankle with indentions from her shoe and mild redness.  Lt leg is a brighter red color.  Also some scaly hyperkeratosis on the front of the left leg and some lymphorrhea bumps on the back of the leg.       LYMPHEDEMA ASSESSMENTS:   LOWER EXTREMITY LANDMARK RIGHT eval  At groin   30 cm proximal to suprapatella    20 cm proximal to suprapatella   10 cm proximal to suprapatella   At midpatella / popliteal crease   30 cm proximal to floor at lateral plantar foot   20 cm proximal to floor at lateral plantar foot   10 cm proximal to floor at lateral plantar foot   Circumference of ankle/heel   5 cm proximal to 1st MTP joint   Across MTP joint   Around proximal great toe   (Blank rows = not tested)  LOWER EXTREMITY LANDMARK LEFT eval  At groin   30 cm proximal to suprapatella   20 cm proximal to suprapatella   10 cm proximal to suprapatella   At midpatella / popliteal crease   30 cm proximal to floor at lateral plantar foot   20 cm proximal to floor at lateral plantar foot   10 cm proximal to floor at lateral plantar foot   Circumference of ankle/heel   5 cm proximal to 1st MTP joint   Across MTP joint   Around proximal great toe   (Blank rows = not tested)  GAIT: Distance walked: walks with rollator walker, slow gait  Outcome measure:  TREATMENT DATE:  Eval performed Discussed POC Discussed what lymphedema is and how the only way to manage this is with compression that she finds comfortable and wearable.    PATIENT EDUCATION:  Education details: Aspects of CDT, POC, Person educated: Patient Education method: Explanation Education comprehension: verbalized understanding  HOME EXERCISE PROGRAM: Use her velcro garment from wound care center, try to sleep out of the recliner   ASSESSMENT:  CLINICAL IMPRESSION: Patient is a 78 y.o. female who was seen today for physical therapy evaluation and treatment for her chronic bil lower extremity lymphedema with the Left leg worse than the Right.  She reports that the swelling was more recent but reviewing her chart it seems to be a chronic issue and has been mentioned in notes at least as far back as 2020.  She has no  cancer hx but did have a 18cm fallopian mass removed in 2022 which could be a reason for the left leg being more involved.  The whole left leg is larger than the Rt with pitting up to the knee only.  She does have CKD and CHF which are controlled but she also has some softer pitting in the leg, so we will use caution to start her CDT.  She has recurrent infections in the left leg and just completed prednisone  for the leg last week but is still having some significant pain.  No cancer hx.  She has found relief with an unna boot recently.  She would benefit from lymphedema education as she has mainly been told there is nothing she can do to improve the leg - started education on this today and how compression will be very important fo her.  She is also sleeping in a dependent position so she was educated on elevation and sleeping in the bed.  Pt is okay with starting CDT with the goal of compression socks or velcro.     OBJECTIVE IMPAIRMENTS: decreased knowledge of condition, decreased knowledge of use of DME, decreased mobility, and increased edema.   ACTIVITY LIMITATIONS: locomotion level  PARTICIPATION LIMITATIONS: community activity  PERSONAL FACTORS: Time since onset of injury/illness/exacerbation and 1-2 comorbidities: CHF, CKD are also affecting patient's functional outcome.   REHAB POTENTIAL: Good  CLINICAL DECISION MAKING: Evolving/moderate complexity  EVALUATION COMPLEXITY: Moderate   GOALS: Goals reviewed with patient? Yes  SHORT TERM GOALS: Target date: 03/26/24  Pt will be educated on what lymphedema is and how it is best managed  Baseline: Goal status: INITIAL  2.  Pt will be educated on risk reduction and infection prevention including skin care Baseline:  Goal status: INITIAL   LONG TERM GOALS: Target date: 04/16/24  Pt will be measured for final compression for bil lower extremities  Baseline:  Goal status: INITIAL  2.  Pt will be educated on self MLD if  able Baseline:  Goal status: INITIAL  3.  Pt will be ind with remedial exercise  Baseline:  Goal status: INITIAL   PLAN:  PT FREQUENCY: 3x/week  PT DURATION: 6 weeks  PLANNED INTERVENTIONS: 97110-Therapeutic exercises, 97530- Therapeutic activity, 97112- Neuromuscular re-education, 97140- Manual therapy, Patient/Family education, Manual lymph drainage, Compression bandaging, DME instructions, and Self Care  PLAN FOR NEXT SESSION: *Remeasure bil legs - eval measurements lost before being recorded*   Lt lower leg gentle bandaging with education on rememdial exercise, watching for s/s of worsening kidney or heart issues.    Encarnacion Harris, PT 03/05/2024, 11:06 PM

## 2024-03-04 NOTE — Therapy (Signed)
 OUTPATIENT PHYSICAL THERAPY TREATMENT NOTE   Patient Name: Tracy [name] MRN: [id number] DOB:1946-04-29, 78 y.o., female Today's Date: 03/04/2024  END OF SESSION:  PT End of Session - 03/04/24 1037     Visit Number 8    Number of Visits 12    Date for PT Re-Evaluation 04/01/24    Authorization Type MCR    Progress Note Due on Visit 10    PT Start Time 1045    PT Stop Time 1125    PT Time Calculation (min) 40 min    Activity Tolerance Patient tolerated treatment well;Patient limited by fatigue                    Past Medical History:  Diagnosis Date   Allergic rhinitis    Anxiety    Arthritis    Ascending aorta dilation (HCC) 09/13/2022   Echo 09/12/2022: EF 50-55, GR 1 DD, mildly reduced RVSF, normal PASP (RVSP 17.7), mild dilation of ascending aorta (38 mm), RAP 3   Breast cancer (HCC) 04/28/2020   rigtht breast   Bronchitis    CAD (coronary artery disease)    1/19 PCI/DES to pLCX for ISR, normal EF.    Cataract    Cervical dysplasia    unsure of procedure, possible "burning" in her late 62s   CHF (congestive heart failure) (HCC)    Echo 06/2019: EF 55-60, elevated LVEDP, normal RV SF, mild MAC, mild MR, trivial TR   Complication of anesthesia    COPD (chronic obstructive pulmonary disease) (HCC)    early    Depression    GERD (gastroesophageal reflux disease)    Gout    Hyperlipidemia    Hypertension    Low back pain    Overactive bladder    PONV (postoperative nausea and vomiting)    Sleep apnea    Past Surgical History:  Procedure Laterality Date   ANGIOPLASTY     stent 2007   BREAST EXCISIONAL BIOPSY Right 04/2020   ADH/LCIS   BREAST LUMPECTOMY WITH RADIOACTIVE SEED LOCALIZATION Right 04/28/2020   Procedure: RIGHT BREAST LUMPECTOMY X 2  WITH RADIOACTIVE SEED LOCALIZATION;  Surgeon: Dareen Ebbing, MD;  Location: [facility];  Service: General;  Laterality: Right;  LMA   CARDIAC CATHETERIZATION     COLONOSCOPY      CORONARY STENT INTERVENTION N/A 11/06/2017   Procedure: CORONARY STENT INTERVENTION;  Surgeon: Sammy Crisp, MD;  Location: [facility];  Service: Cardiovascular;  Laterality: N/A;   LEFT HEART CATH AND CORONARY ANGIOGRAPHY N/A 11/06/2017   Procedure: LEFT HEART CATH AND CORONARY ANGIOGRAPHY;  Surgeon: Sammy Crisp, MD;  Location: [facility];  Service: Cardiovascular;  Laterality: N/A;   LEFT HEART CATH AND CORONARY ANGIOGRAPHY N/A 07/24/2019   Procedure: LEFT HEART CATH AND CORONARY ANGIOGRAPHY;  Surgeon: Odie Benne, MD;  Location: [facility];  Service: Cardiovascular;  Laterality: N/A;   RIGHT/LEFT HEART CATH AND CORONARY ANGIOGRAPHY N/A 09/29/2020   Procedure: RIGHT/LEFT HEART CATH AND CORONARY ANGIOGRAPHY;  Surgeon: Arty Binning, MD;  Location: [facility];  Service: Cardiovascular;  Laterality: N/A;   ROBOTIC ASSISTED BILATERAL SALPINGO OOPHERECTOMY Bilateral 11/10/2020   Procedure: XI ROBOTIC ASSISTED BILATERAL SALPINGO OOPHORECTOMY WITH MINI LAPAROTOMY FOR DRAINAGE;  Surgeon: Suzi Essex, MD;  Location: [facility];  Service: Gynecology;  Laterality: Bilateral;  MINI LAP FIRST   TONSILLECTOMY     VULVECTOMY N/A 11/10/2020   Procedure: WIDE EXCISION VULVECTOMY;  Surgeon: Suzi Essex, MD;  Location: [facility];  Service: Gynecology;  Laterality: N/A;   Patient Active Problem List   Diagnosis Date Noted   Weakness 10/05/2023   Bronchopneumonia 10/05/2023   Multifocal pneumonia 09/29/2023   Hypokalemia 08/07/2023   Intertrigo 04/18/2023   Venous stasis dermatitis of both lower extremities 11/28/2022   Ascending aorta dilation (HCC) 09/13/2022   Dizziness 01/19/2022   Frequent falls 01/19/2022   Tremor of both hands 08/16/2021   Aortic atherosclerosis (HCC) 06/15/2021   Generalized osteoarthritis of multiple sites 12/25/2020   Chronic pain disorder 12/25/2020   Pelvic mass in female    Bilateral lower extremity edema  04/13/2020   Shortness of breath    Anxiety disorder 10/29/2018   CKD (chronic kidney disease), stage III (HCC) 06/27/2018   COPD (chronic obstructive pulmonary disease) (HCC) 05/25/2018   Obesity 03/20/2018   Dyspnea on exertion 11/06/2017   Abnormal stress test 11/06/2017   Chronic heart failure with preserved ejection fraction (HCC) 09/04/2017   Left ventricular dysfunction 07/31/2017   Cataract, nuclear sclerotic senile, bilateral 02/01/2017   Gouty arthritis of toe of left foot 08/09/2016   Hyperuricemia 08/09/2016   Osteoarthritis (arthritis due to wear and tear of joints) 01/14/2014   Atherosclerosis of native coronary artery of native heart with angina pectoris (HCC) 08/02/2011   Mitral regurgitation 08/02/2011   OVERACTIVE BLADDER 07/02/2010   ACUTE CYSTITIS 05/25/2010   Mixed hyperlipidemia 08/11/2009   Leg edema, left 07/22/2009   GERD 04/11/2008   Constipation 04/11/2008   Essential hypertension 05/30/2007   MYOCARDIAL INFARCTION, HX OF 05/30/2007   Allergic rhinitis 05/30/2007   LOW BACK PAIN 05/30/2007    PCP: [place], [name], MD   REFERRING PROVIDER: [place], [name], MD  REFERRING DIAG: R29.6 (ICD-10-CM) - Frequent falls M54.50,G89.29 (ICD-10-CM) - Chronic low back pain without sciatica, unspecified back pain laterality  THERAPY DIAG:  Unsteadiness on feet  Muscle weakness (generalized)  Rationale for Evaluation and Treatment: Rehabilitation  ONSET DATE: chronic  SUBJECTIVE:   SUBJECTIVE STATEMENT:  03/04/2024 States she did well after last session. Steroid improving her LLE symptoms. States she got lymphedema referral and is getting evaluated next week. No other new updates.     PERTINENT HISTORY: Chronic low back pain without sciatica, unspecified back pain laterality Assessment & Plan: No many options for pain management. Tramadol  helped, so we decided to continue. PT will be arranged. Side effects discussed. PDMP reviewed. Will sign med  contract next visit.   Orders: -     Ambulatory referral to Physical Therapy -     traMADol  HCl; Take 0.5 tablets (25 mg total) by mouth every 12 (twelve) hours as needed.  Dispense: 30 tablet; Refill: 1 PAIN:  Are you having pain? Yes: NPRS scale: 5/10 Pain location: low back Pain description: ache Aggravating factors: weather, housekeeping, prolonged position Relieving factors: medication, heat, ice  PRECAUTIONS: Fall  RED FLAGS: None   WEIGHT BEARING RESTRICTIONS: No  FALLS:  Has patient fallen in last 6 months? No  OCCUPATION: retired  PLOF: Independent with household mobility with device  PATIENT GOALS: To get around better  NEXT MD VISIT: TBD  OBJECTIVE:  Note: Objective measures were completed at Evaluation unless otherwise noted.  DIAGNOSTIC FINDINGS: none  PATIENT SURVEYS: Patient-specific activity scoring scheme (Point to one number):  "0" represents "unable to perform." "10" represents "able to perform at prior level. 0 1 2 3 4 5 6 7 8 9  10 (Date and Score) Activity Initial  Activity Eval   03/04/24  walking  2 4   Stair climbing  0 2   sweeping 1 0   Total score = sum of the activity scores/number of activities Minimum detectable change (90%CI) for average score = 2 points Minimum detectable change (90%CI) for single activity score = 3 points PSFS developed by: [place] Spitz., & Binkley, J. (1995). Assessing disability and change on individual  patients: a report of a patient specific measure. Physiotherapy [place], [phone], [phone]. Reproduced with the permission of the authors  Score: 3/30 10% perceived function 03/04/24 6/30 20% perceived function  MUSCLE LENGTH: Hamstrings: Right 90 deg; Left 90 deg  POSTURE: flexed hip posture  PALPATION: N/a  LOWER EXTREMITY ROM: WFL for gait and transfers, ankle mobility limited by chronic edema  AROM Right eval Left eval  Hip flexion    Hip extension    Hip abduction    Hip  adduction    Hip internal rotation    Hip external rotation    Knee flexion    Knee extension    Ankle dorsiflexion    Ankle plantarflexion    Ankle inversion    Ankle eversion     (Blank rows = not tested)  LOWER EXTREMITY MMT:  MMT Right eval Left eval  Hip flexion    Hip extension    Hip abduction    Hip adduction    Hip internal rotation    Hip external rotation    Knee flexion    Knee extension    Ankle dorsiflexion    Ankle plantarflexion    Ankle inversion    Ankle eversion     (Blank rows = not tested)  LOWER EXTREMITY SPECIAL TESTS:  N/A  FUNCTIONAL TESTS:  30 seconds chair stand test 5 reps 03/04/24 5 reps with UE support  GAIT: Distance walked: 238ft Assistive device utilized: Environmental consultant - 4 wheeled Level of assistance: Modified independence Comments: slow cadence and flexed posture                                                                                                                                TREATMENT DATE:  [facility] Adult PT Treatment:                                                DATE: 03/04/24 Therapeutic Exercise: Nustep L4 8 min  Neuromuscular re-ed:   03/04/24 0001  Berg Balance Test  Sit to Stand 3  Standing Unsupported 4  Sitting with Back Unsupported but Feet Supported on Floor or Stool 4  Stand to Sit 4  Transfers 3  Standing Unsupported with Eyes Closed 4  Standing Unsupported with Feet Together 3  From Standing, Reach Forward with Outstretched Arm 3  From Standing Position, Pick up Object  from Floor 3  From Standing Position, Turn to Look Behind Over each Shoulder 3  Turn 360 Degrees 1  Standing Unsupported, Alternately Place Feet on Step/Stool 2  Standing Unsupported, One Foot in Front 2  Standing on One Leg 1  Total Score 40   Therapeutic Activity: 2 MWT 237ft 30s chair stand test 5 reps with UE support  [facility] Adult PT Treatment:                                                DATE: 02/28/24 Therapeutic  Exercise: Nu step L3LE/UE during subjective  Standing heel raise x12 w UE support Seated LAQ 2x8 BIL HEP update + education  Therapeutic Activity: STS raised mat x8 cues for breath control and positioning  4 inch step tap x10 BIL unilat UE support cues for soft impact, pacing  Standing unresisted hamstring curl 2x5 BIL    [facility] Adult PT Treatment:                                                DATE: 02/26/24 Therapeutic Exercise: Nu step L4 UE/LE  Standing heel raises 2x10 w/ UE support   Therapeutic Activity: STS from raised mat 2x5 cues for pacing and breath control Standing tandem balance x30sec BIL, CGA, trying to wean UE support  Standing slow march 2x5 BIL emphasis on pace/posture, UE support CGA   Self Care: Education + discussion re: continued monitoring of LE, communication w/ providers, possible benefit of inquiring about lymphedema PT    [facility] Adult PT Treatment:                                                DATE: 02/21/24 Therapeutic Exercise: Nu step L2-L4, LE/UE 8 min during subjective  Seated red band LAQ x12 cues for setup (around foot rather than shin given wound) Seated heel/toe raises x20 Lateral stepping at counter weaning UE support 3 laps cues for posture and pacing        [facility] Adult PT Treatment:                                                DATE: 02/19/24 Therapeutic Exercise: Nustep L2 8 min Neuromuscular re-ed: Seated heel/toe 15x2 using RB Seated FAQs 15x2 B Seated heel slides over towel 15x2 B Therapeutic Activity: Standing marching 15/15 Standing heel raises 15x Standing toe raises 15x   PATIENT EDUCATION:  Education details: rationale for interventions, HEP  Person educated: Patient Education method: Explanation, Demonstration, Tactile cues, Verbal cues Education comprehension: verbalized understanding, returned demonstration, verbal cues required, tactile cues required, and needs further education     HOME EXERCISE  PROGRAM: Access Code: [email or web address] URL: [email or web address] Date: 02/28/2024 Prepared by: Mayme Spearman  Exercises - Seated Long Arc Quad  - 2-3 x daily - 5 x weekly - 2 sets - 10 reps - Standing Heel Raise with Support  - 2-3 x daily - 5 x weekly -  2 sets - 10 reps - Sit to Stand with Counter Support  - 2-3 x daily - 5 x weekly - 1 sets - 5 reps - Standing March with Counter Support  - 2-3 x daily - 5 x weekly - 1 sets - 5 reps - Standing Knee Flexion AROM with Chair Support  - 2-3 x daily - 5 x weekly - 1 sets - 5 reps  ASSESSMENT:  CLINICAL IMPRESSION:  Today's session re-assessed progress towards goals.  Gains made in distance on 2 MWT, BERG and PSFS.  Pain levels unchanged.  Patient to undergo lymphedema assessment to address LE swelling which may help with overall mobility.     Eval - Patient is a 78 y.o. female who was seen today for physical therapy evaluation and treatment for physical deconditioning following hospital stay in 09/2023 due to bout of pneumonia.  She has a past medical history of chronic low back pain as well as BLE cellulitis and edema which limits mobility.  Sensory loss in LE's also impacts mobility and balance, increasing fall risk.  OBJECTIVE IMPAIRMENTS: Abnormal gait, decreased activity tolerance, decreased balance, decreased coordination, decreased endurance, decreased knowledge of condition, decreased knowledge of use of DME, decreased mobility, difficulty walking, decreased strength, impaired sensation, improper body mechanics, obesity, and pain.   ACTIVITY LIMITATIONS: carrying, lifting, bending, sitting, standing, squatting, stairs, and transfers  PERSONAL FACTORS: Age, Fitness, Past/current experiences, and 1-2 comorbidities: chronic low back pain and BLE cellulitis are also affecting patient's functional outcome.   REHAB POTENTIAL: Fair based on chronicity and co-morbidities  CLINICAL DECISION MAKING: Evolving/moderate  complexity  EVALUATION COMPLEXITY: Low   GOALS: Goals reviewed with patient? No  SHORT TERM GOALS: Target date: 02/21/2024   Patient to demonstrate independence in HEP  Baseline: [id number] 02/26/24: reports good adherence w/ HEP Goal status: MET   2.  Assess BERG to establish static balance baseline. Baseline: TBD; 02/12/24 BERG complete 36 Goal status: Met   LONG TERM GOALS: Target date: 03/13/2024  Patient will increase 30s chair stand reps from 5 to 8 with/without arms to demonstrate and improved functional ability with less pain/difficulty as well as reduce fall risk.  Baseline: 5; 03/04/24 5 reps Goal status: Ongoing  2.  Patient will score at least 9/30 on PSFS to signify clinically meaningful improvement in functional abilities.   Baseline: 3/30; 03/04/24 6/10 Goal status: Progressing   3.  Patient will acknowledge 8/10 pain at least once during episode of care   Baseline: 10/10; 03/04/24 10/10 Goal status: Ongoing  4.  Perform 2 MWT to assess progress in strength and endurance. Baseline: 247ft; 03/04/24 259ft Goal status: Progressing  5.  Perform BERG to assess progress in balance/stability. Baseline: 02/12/24 36; 03/04/24 40 Goal status: Progressing    PLAN:  PT FREQUENCY: 2x/week  PT DURATION: 6 weeks  PLANNED INTERVENTIONS: 97164- PT Re-evaluation, 97110-Therapeutic exercises, 97530- Therapeutic activity, 97112- Neuromuscular re-education, 97535- Self Care, 54098- Manual therapy, and 97116- Gait training  PLAN FOR NEXT SESSION: HEP review and update, manual techniques as appropriate, aerobic tasks, ROM and flexibility activities, strengthening and PREs, TPDN, gait and balance training as needed     Jeff Babbette Dalesandro PT  03/04/2024 11:18 AM

## 2024-03-05 ENCOUNTER — Ambulatory Visit: Attending: Family Medicine | Admitting: Rehabilitation

## 2024-03-05 ENCOUNTER — Encounter: Payer: Self-pay | Admitting: Rehabilitation

## 2024-03-05 ENCOUNTER — Other Ambulatory Visit: Payer: Self-pay

## 2024-03-05 DIAGNOSIS — I890 Lymphedema, not elsewhere classified: Secondary | ICD-10-CM | POA: Diagnosis not present

## 2024-03-05 DIAGNOSIS — R600 Localized edema: Secondary | ICD-10-CM | POA: Insufficient documentation

## 2024-03-05 NOTE — Therapy (Signed)
 OUTPATIENT PHYSICAL THERAPY TREATMENT NOTE/DC SUMMARY   Patient Name: Tracy [name] MRN: [id number] DOB:1946/02/19, 78 y.o., female Today's Date: 03/06/2024 PHYSICAL THERAPY DISCHARGE SUMMARY  Visits from Start of Care: 9  Current functional level related to goals / functional outcomes: Partially met   Remaining deficits: Weakness and balance/mobility deficits   Education / Equipment: HEP   Patient agrees to discharge. Patient goals were partially met. Patient is being discharged due to a change in medical status.  END OF SESSION:  PT End of Session - 03/06/24 1134     Visit Number 9    Number of Visits 9   lymphedema   Date for PT Re-Evaluation 04/16/24    Authorization Type MCR    Progress Note Due on Visit 10    PT Start Time 1130    PT Stop Time 1210    PT Time Calculation (min) 40 min    Activity Tolerance Patient tolerated treatment well    Behavior During Therapy WFL for tasks assessed/performed            Past Medical History:  Diagnosis Date   Allergic rhinitis    Anxiety    Arthritis    Ascending aorta dilation (HCC) 09/13/2022   Echo 09/12/2022: EF 50-55, GR 1 DD, mildly reduced RVSF, normal PASP (RVSP 17.7), mild dilation of ascending aorta (38 mm), RAP 3   Breast cancer (HCC) 04/28/2020   rigtht breast   Bronchitis    CAD (coronary artery disease)    1/19 PCI/DES to pLCX for ISR, normal EF.    Cataract    Cervical dysplasia    unsure of procedure, possible "burning" in her late 41s   CHF (congestive heart failure) (HCC)    Echo 06/2019: EF 55-60, elevated LVEDP, normal RV SF, mild MAC, mild MR, trivial TR   Complication of anesthesia    COPD (chronic obstructive pulmonary disease) (HCC)    early    Depression    GERD (gastroesophageal reflux disease)    Gout    Hyperlipidemia    Hypertension    Low back pain    Overactive bladder    PONV (postoperative nausea and vomiting)    Sleep apnea    Past Surgical History:  Procedure  Laterality Date   ANGIOPLASTY     stent 2007   BREAST EXCISIONAL BIOPSY Right 04/2020   ADH/LCIS   BREAST LUMPECTOMY WITH RADIOACTIVE SEED LOCALIZATION Right 04/28/2020   Procedure: RIGHT BREAST LUMPECTOMY X 2  WITH RADIOACTIVE SEED LOCALIZATION;  Surgeon: Dareen Ebbing, MD;  Location: [facility];  Service: General;  Laterality: Right;  LMA   CARDIAC CATHETERIZATION     COLONOSCOPY     CORONARY STENT INTERVENTION N/A 11/06/2017   Procedure: CORONARY STENT INTERVENTION;  Surgeon: Sammy Crisp, MD;  [facility]: [facility];  Service: Cardiovascular;  Laterality: N/A;   LEFT HEART CATH AND CORONARY ANGIOGRAPHY N/A 11/06/2017   Procedure: LEFT HEART CATH AND CORONARY ANGIOGRAPHY;  Surgeon: Sammy Crisp, MD;  [facility]: [facility];  Service: Cardiovascular;  Laterality: N/A;   LEFT HEART CATH AND CORONARY ANGIOGRAPHY N/A 07/24/2019   Procedure: LEFT HEART CATH AND CORONARY ANGIOGRAPHY;  Surgeon: Odie Benne, MD;  [facility]: [facility];  Service: Cardiovascular;  Laterality: N/A;   RIGHT/LEFT HEART CATH AND CORONARY ANGIOGRAPHY N/A 09/29/2020   Procedure: RIGHT/LEFT HEART CATH AND CORONARY ANGIOGRAPHY;  Surgeon: Arty Binning, MD;  [facility]: [facility];  Service: Cardiovascular;  Laterality:  N/A;   ROBOTIC ASSISTED BILATERAL SALPINGO OOPHERECTOMY Bilateral 11/10/2020   Procedure: XI ROBOTIC ASSISTED BILATERAL SALPINGO OOPHORECTOMY WITH MINI LAPAROTOMY FOR DRAINAGE;  Surgeon: Suzi Essex, MD;  Location: [facility];  Service: Gynecology;  Laterality: Bilateral;  MINI LAP FIRST   TONSILLECTOMY     VULVECTOMY N/A 11/10/2020   Procedure: WIDE EXCISION VULVECTOMY;  Surgeon: Suzi Essex, MD;  Location: [facility];  Service: Gynecology;  Laterality: N/A;   Patient Active Problem List   Diagnosis Date Noted   Weakness 10/05/2023   Bronchopneumonia 10/05/2023   Multifocal pneumonia 09/29/2023   Hypokalemia 08/07/2023   Intertrigo  04/18/2023   Venous stasis dermatitis of both lower extremities 11/28/2022   Ascending aorta dilation (HCC) 09/13/2022   Dizziness 01/19/2022   Frequent falls 01/19/2022   Tremor of both hands 08/16/2021   Aortic atherosclerosis (HCC) 06/15/2021   Generalized osteoarthritis of multiple sites 12/25/2020   Chronic pain disorder 12/25/2020   Pelvic mass in female    Bilateral lower extremity edema 04/13/2020   Shortness of breath    Anxiety disorder 10/29/2018   CKD (chronic kidney disease), stage III (HCC) 06/27/2018   COPD (chronic obstructive pulmonary disease) (HCC) 05/25/2018   Obesity 03/20/2018   Dyspnea on exertion 11/06/2017   Abnormal stress test 11/06/2017   Chronic heart failure with preserved ejection fraction (HCC) 09/04/2017   Left ventricular dysfunction 07/31/2017   Cataract, nuclear sclerotic senile, bilateral 02/01/2017   Gouty arthritis of toe of left foot 08/09/2016   Hyperuricemia 08/09/2016   Osteoarthritis (arthritis due to wear and tear of joints) 01/14/2014   Atherosclerosis of native coronary artery of native heart with angina pectoris (HCC) 08/02/2011   Mitral regurgitation 08/02/2011   OVERACTIVE BLADDER 07/02/2010   ACUTE CYSTITIS 05/25/2010   Mixed hyperlipidemia 08/11/2009   Leg edema, left 07/22/2009   GERD 04/11/2008   Constipation 04/11/2008   Essential hypertension 05/30/2007   MYOCARDIAL INFARCTION, HX OF 05/30/2007   Allergic rhinitis 05/30/2007   LOW BACK PAIN 05/30/2007    PCP: [place], [name], MD   REFERRING PROVIDER: [place], [name], MD  REFERRING DIAG: R29.6 (ICD-10-CM) - Frequent falls M54.50,G89.29 (ICD-10-CM) - Chronic low back pain without sciatica, unspecified back pain laterality  THERAPY DIAG:  Unsteadiness on feet  Muscle weakness (generalized)  Other low back pain  Rationale for Evaluation and Treatment: Rehabilitation  ONSET DATE: chronic  SUBJECTIVE:   SUBJECTIVE STATEMENT: Patient underwent lymphedema  assessment and will begin therapy to address ongoing issues with B leg swelling, pain and redness.  Will begin with a frequency of 3 x/week and will hold current balance/mobility therapy until completion of lymphedema treatment.    PERTINENT HISTORY: Chronic low back pain without sciatica, unspecified back pain laterality Assessment & Plan: No many options for pain management. Tramadol  helped, so we decided to continue. PT will be arranged. Side effects discussed. PDMP reviewed. Will sign med contract next visit.   Orders: -     Ambulatory referral to Physical Therapy -     traMADol  HCl; Take 0.5 tablets (25 mg total) by mouth every 12 (twelve) hours as needed.  Dispense: 30 tablet; Refill: 1 PAIN:  Are you having pain? Yes: NPRS scale: 5/10 Pain location: low back Pain description: ache Aggravating factors: weather, housekeeping, prolonged position Relieving factors: medication, heat, ice  PRECAUTIONS: Fall  RED FLAGS: None   WEIGHT BEARING RESTRICTIONS: No  FALLS:  Has patient fallen in last 6 months? No  OCCUPATION: retired  PLOF: Independent with  household mobility with device  PATIENT GOALS: To get around better  NEXT MD VISIT: TBD  OBJECTIVE:  Note: Objective measures were completed at Evaluation unless otherwise noted.  DIAGNOSTIC FINDINGS: none  PATIENT SURVEYS: Patient-specific activity scoring scheme (Point to one number):  "0" represents "unable to perform." "10" represents "able to perform at prior level. 0 1 2 3 4 5 6 7 8 9  10 (Date and Score) Activity Initial  Activity Eval   03/04/24  walking  2 4   Stair climbing  0 2   sweeping 1 0   Total score = sum of the activity scores/number of activities Minimum detectable change (90%CI) for average score = 2 points Minimum detectable change (90%CI) for single activity score = 3 points PSFS developed by: [place] Spitz., & Binkley, J. (1995). Assessing disability and change on  individual  patients: a report of a patient specific measure. Physiotherapy [place], 47, [phone]. Reproduced with the permission of the authors  Score: 3/30 10% perceived function 03/04/24 6/30 20% perceived function  MUSCLE LENGTH: Hamstrings: Right 90 deg; Left 90 deg  POSTURE: flexed hip posture  PALPATION: N/a  LOWER EXTREMITY ROM: WFL for gait and transfers, ankle mobility limited by chronic edema  AROM Right eval Left eval  Hip flexion    Hip extension    Hip abduction    Hip adduction    Hip internal rotation    Hip external rotation    Knee flexion    Knee extension    Ankle dorsiflexion    Ankle plantarflexion    Ankle inversion    Ankle eversion     (Blank rows = not tested)  LOWER EXTREMITY MMT:  MMT Right eval Left eval  Hip flexion    Hip extension    Hip abduction    Hip adduction    Hip internal rotation    Hip external rotation    Knee flexion    Knee extension    Ankle dorsiflexion    Ankle plantarflexion    Ankle inversion    Ankle eversion     (Blank rows = not tested)  LOWER EXTREMITY SPECIAL TESTS:  N/A  FUNCTIONAL TESTS:  30 seconds chair stand test 5 reps 03/04/24 5 reps with UE support  GAIT: Distance walked: 277ft Assistive device utilized: Walker - 4 wheeled Level of assistance: Modified independence Comments: slow cadence and flexed posture                                                                                                                                TREATMENT DATE:  [facility] Adult PT Treatment:                                                DATE: 03/06/24  Therapeutic  Activity: Seated FAQs 15x B Standing heel raises 15x Standing toe raises 15x Standing march 15x B Standing abduction 15x B Tandem stance with UE support 30s B STS 5x  [facility] Adult PT Treatment:                                                DATE: 03/04/24 Therapeutic Exercise: Nustep L4 8 min  Neuromuscular re-ed:   03/04/24 0001  Berg  Balance Test  Sit to Stand 3  Standing Unsupported 4  Sitting with Back Unsupported but Feet Supported on Floor or Stool 4  Stand to Sit 4  Transfers 3  Standing Unsupported with Eyes Closed 4  Standing Unsupported with Feet Together 3  From Standing, Reach Forward with Outstretched Arm 3  From Standing Position, Pick up Object from Floor 3  From Standing Position, Turn to Look Behind Over each Shoulder 3  Turn 360 Degrees 1  Standing Unsupported, Alternately Place Feet on Step/Stool 2  Standing Unsupported, One Foot in Front 2  Standing on One Leg 1  Total Score 40   Therapeutic Activity: 2 MWT 239ft 30s chair stand test 5 reps with UE support  [facility] Adult PT Treatment:                                                DATE: 02/28/24 Therapeutic Exercise: Nu step L3LE/UE during subjective  Standing heel raise x12 w UE support Seated LAQ 2x8 BIL HEP update + education  Therapeutic Activity: STS raised mat x8 cues for breath control and positioning  4 inch step tap x10 BIL unilat UE support cues for soft impact, pacing  Standing unresisted hamstring curl 2x5 BIL    [facility] Adult PT Treatment:                                                DATE: 02/26/24 Therapeutic Exercise: Nu step L4 UE/LE  Standing heel raises 2x10 w/ UE support   Therapeutic Activity: STS from raised mat 2x5 cues for pacing and breath control Standing tandem balance x30sec BIL, CGA, trying to wean UE support  Standing slow march 2x5 BIL emphasis on pace/posture, UE support CGA   Self Care: Education + discussion re: continued monitoring of LE, communication w/ providers, possible benefit of inquiring about lymphedema PT    [facility] Adult PT Treatment:                                                DATE: 02/21/24 Therapeutic Exercise: Nu step L2-L4, LE/UE 8 min during subjective  Seated red band LAQ x12 cues for setup (around foot rather than shin given wound) Seated heel/toe raises x20 Lateral  stepping at counter weaning UE support 3 laps cues for posture and pacing        [facility] Adult PT Treatment:  DATE: 02/19/24 Therapeutic Exercise: Nustep L2 8 min Neuromuscular re-ed: Seated heel/toe 15x2 using RB Seated FAQs 15x2 B Seated heel slides over towel 15x2 B Therapeutic Activity: Standing marching 15/15 Standing heel raises 15x Standing toe raises 15x   PATIENT EDUCATION:  Education details: rationale for interventions, HEP  Person educated: Patient Education method: Explanation, Demonstration, Tactile cues, Verbal cues Education comprehension: verbalized understanding, returned demonstration, verbal cues required, tactile cues required, and needs further education     HOME EXERCISE PROGRAM: Access Code: [email or web address] URL: [email or web address] Date: 02/28/2024 Prepared by: Mayme Spearman  Exercises - Seated Long Arc Quad  - 2-3 x daily - 5 x weekly - 2 sets - 10 reps - Standing Heel Raise with Support  - 2-3 x daily - 5 x weekly - 2 sets - 10 reps - Sit to Stand with Counter Support  - 2-3 x daily - 5 x weekly - 1 sets - 5 reps - Standing March with Counter Support  - 2-3 x daily - 5 x weekly - 1 sets - 5 reps - Standing Knee Flexion AROM with Chair Support  - 2-3 x daily - 5 x weekly - 1 sets - 5 reps  ASSESSMENT:  CLINICAL IMPRESSION:  Focus of today was HEP review and update.  Patient will begin lymphedema therapy and will place OPPT on hold.  Goals partially met at this time.    Eval - Patient is a 78 y.o. female who was seen today for physical therapy evaluation and treatment for physical deconditioning following hospital stay in 09/2023 due to bout of pneumonia.  She has a past medical history of chronic low back pain as well as BLE cellulitis and edema which limits mobility.  Sensory loss in LE's also impacts mobility and balance, increasing fall risk.  OBJECTIVE IMPAIRMENTS: Abnormal gait, decreased  activity tolerance, decreased balance, decreased coordination, decreased endurance, decreased knowledge of condition, decreased knowledge of use of DME, decreased mobility, difficulty walking, decreased strength, impaired sensation, improper body mechanics, obesity, and pain.   ACTIVITY LIMITATIONS: carrying, lifting, bending, sitting, standing, squatting, stairs, and transfers  PERSONAL FACTORS: Age, Fitness, Past/current experiences, and 1-2 comorbidities: chronic low back pain and BLE cellulitis are also affecting patient's functional outcome.   REHAB POTENTIAL: Fair based on chronicity and co-morbidities  CLINICAL DECISION MAKING: Evolving/moderate complexity  EVALUATION COMPLEXITY: Low   GOALS: Goals reviewed with patient? No  SHORT TERM GOALS: Target date: 02/21/2024   Patient to demonstrate independence in HEP  Baseline: [id number] 02/26/24: reports good adherence w/ HEP Goal status: MET   2.  Assess BERG to establish static balance baseline. Baseline: TBD; 02/12/24 BERG complete 36 Goal status: Met   LONG TERM GOALS: Target date: 03/13/2024  Patient will increase 30s chair stand reps from 5 to 8 with/without arms to demonstrate and improved functional ability with less pain/difficulty as well as reduce fall risk.  Baseline: 5; 03/04/24 5 reps Goal status: Ongoing  2.  Patient will score at least 9/30 on PSFS to signify clinically meaningful improvement in functional abilities.   Baseline: 3/30; 03/04/24 6/10 Goal status: Progressing   3.  Patient will acknowledge 8/10 pain at least once during episode of care   Baseline: 10/10; 03/04/24 10/10 Goal status: Ongoing  4.  Perform 2 MWT to assess progress in strength and endurance. Baseline: 253ft; 03/04/24 232ft Goal status: Progressing  5.  Perform BERG to assess progress in balance/stability. Baseline: 02/12/24 36; 03/04/24 40 Goal status: Progressing    PLAN:  PT  FREQUENCY: 2x/week  PT DURATION: 6 weeks  PLANNED  INTERVENTIONS: 97164- PT Re-evaluation, 97110-Therapeutic exercises, 97530- Therapeutic activity, [id number]- Neuromuscular re-education, 97535- Self Care, 69629- Manual therapy, and 97116- Gait training  PLAN FOR NEXT SESSION: DC to lymphedema therapy   Jeff Kinneth Fujiwara PT  03/06/2024 1:20 PM

## 2024-03-06 ENCOUNTER — Ambulatory Visit

## 2024-03-06 DIAGNOSIS — M5459 Other low back pain: Secondary | ICD-10-CM

## 2024-03-06 DIAGNOSIS — R2681 Unsteadiness on feet: Secondary | ICD-10-CM | POA: Diagnosis not present

## 2024-03-06 DIAGNOSIS — M6281 Muscle weakness (generalized): Secondary | ICD-10-CM

## 2024-03-07 ENCOUNTER — Encounter: Payer: Self-pay | Admitting: Rehabilitation

## 2024-03-07 ENCOUNTER — Encounter: Payer: Self-pay | Admitting: Internal Medicine

## 2024-03-07 ENCOUNTER — Ambulatory Visit (INDEPENDENT_AMBULATORY_CARE_PROVIDER_SITE_OTHER): Admitting: Internal Medicine

## 2024-03-07 VITALS — BP 120/68 | HR 78 | Temp 97.6°F | Wt 219.7 lb

## 2024-03-07 DIAGNOSIS — M25552 Pain in left hip: Secondary | ICD-10-CM

## 2024-03-07 MED ORDER — MELOXICAM 7.5 MG PO TABS: 7.5000 mg | ORAL_TABLET | Freq: Every day | ORAL | 0 refills | Status: DC

## 2024-03-07 NOTE — Progress Notes (Signed)
 Established Patient Office Visit     CC/Reason for Visit: Left hip pain  HPI: Tracy [name] is a 78 y.o. female who is coming in today for the above mentioned reasons.  Left hip pain has been ongoing for about 2 weeks.  No preceding injury that she can recall, no urinary symptoms.  It hurts to lift her knee or bend down from the waist.  She is going to physical therapy to work on her mobility and balance.  She does not recall any exercises that could have affected her left hip.   Past Medical/Surgical History: Past Medical History:  Diagnosis Date   Allergic rhinitis    Anxiety    Arthritis    Ascending aorta dilation (HCC) 09/13/2022   Echo 09/12/2022: EF 50-55, GR 1 DD, mildly reduced RVSF, normal PASP (RVSP 17.7), mild dilation of ascending aorta (38 mm), RAP 3   Breast cancer (HCC) 04/28/2020   rigtht breast   Bronchitis    CAD (coronary artery disease)    1/19 PCI/DES to pLCX for ISR, normal EF.    Cataract    Cervical dysplasia    unsure of procedure, possible "burning" in her late 88s   CHF (congestive heart failure) (HCC)    Echo 06/2019: EF 55-60, elevated LVEDP, normal RV SF, mild MAC, mild MR, trivial TR   Complication of anesthesia    COPD (chronic obstructive pulmonary disease) (HCC)    early    Depression    GERD (gastroesophageal reflux disease)    Gout    Hyperlipidemia    Hypertension    Low back pain    Overactive bladder    PONV (postoperative nausea and vomiting)    Sleep apnea     Past Surgical History:  Procedure Laterality Date   ANGIOPLASTY     stent 2007   BREAST EXCISIONAL BIOPSY Right 04/2020   ADH/LCIS   BREAST LUMPECTOMY WITH RADIOACTIVE SEED LOCALIZATION Right 04/28/2020   Procedure: RIGHT BREAST LUMPECTOMY X 2  WITH RADIOACTIVE SEED LOCALIZATION;  Surgeon: Dareen Ebbing, MD;  Location: [facility];  Service: General;  Laterality: Right;  LMA   CARDIAC CATHETERIZATION     COLONOSCOPY     CORONARY STENT  INTERVENTION N/A 11/06/2017   Procedure: CORONARY STENT INTERVENTION;  Surgeon: Sammy Crisp, MD;  [facility];  Service: Cardiovascular;  Laterality: N/A;   LEFT HEART CATH AND CORONARY ANGIOGRAPHY N/A 11/06/2017   Procedure: LEFT HEART CATH AND CORONARY ANGIOGRAPHY;  Surgeon: Sammy Crisp, MD;  [facility];  Service: Cardiovascular;  Laterality: N/A;   LEFT HEART CATH AND CORONARY ANGIOGRAPHY N/A 07/24/2019   Procedure: LEFT HEART CATH AND CORONARY ANGIOGRAPHY;  Surgeon: Odie Benne, MD;  [facility];  Service: Cardiovascular;  Laterality: N/A;   RIGHT/LEFT HEART CATH AND CORONARY ANGIOGRAPHY N/A 09/29/2020   Procedure: RIGHT/LEFT HEART CATH AND CORONARY ANGIOGRAPHY;  Surgeon: Arty Binning, MD;  [facility]: [facility];  Service: Cardiovascular;  Laterality: N/A;   ROBOTIC ASSISTED BILATERAL SALPINGO OOPHERECTOMY Bilateral 11/10/2020   Procedure: XI ROBOTIC ASSISTED BILATERAL SALPINGO OOPHORECTOMY WITH MINI LAPAROTOMY FOR DRAINAGE;  Surgeon: Suzi Essex, MD;  Location: [facility];  Service: Gynecology;  Laterality: Bilateral;  MINI LAP FIRST   TONSILLECTOMY     VULVECTOMY N/A 11/10/2020   Procedure: WIDE EXCISION VULVECTOMY;  Surgeon: Suzi Essex, MD;  Location: [facility];  Service: Gynecology;  Laterality: N/A;  Social History:  reports that she quit smoking about 7 years ago. Her smoking use included cigarettes. She started smoking about 59 years ago. She has a 104 pack-year smoking history. She has never used smokeless tobacco. She reports current alcohol use. She reports that she does not use drugs.  Allergies: Allergies  Allergen Reactions   Ak-Mycin [Erythromycin] Rash   Sulfamethoxazole Rash    Family History:  Family History  Adopted: Yes  Problem Relation Age of Onset   Colon cancer Mother 56   Cancer Mother        colon   Depression Mother        Suicide.   Alzheimer's disease  Maternal Uncle    Diabetes Daughter        type 2   Other Other        patient is adopted   Stomach cancer Neg Hx      Current Outpatient Medications:    acetaminophen  (TYLENOL ) 650 MG CR tablet, Take 1,300 mg by mouth 2 (two) times daily as needed for pain., Disp: , Rfl:    allopurinol  (ZYLOPRIM ) 100 MG tablet, TAKE 1 TABLET BY MOUTH EVERY DAY, Disp: 90 tablet, Rfl: 1   chlorthalidone  (HYGROTON ) 25 MG tablet, TAKE 1 TABLET (25 MG TOTAL) BY MOUTH DAILY., Disp: 90 tablet, Rfl: 2   clopidogrel  (PLAVIX ) 75 MG tablet, TAKE 1 TABLET BY MOUTH EVERY DAY WITH BREAKFAST, Disp: 90 tablet, Rfl: 2   estradiol  (ESTRACE  VAGINAL) 0.1 MG/GM vaginal cream, Apply a pea sized amount just inside the vagina nightly for 2 weeks, then twice weekly thereafter (Patient taking differently: Place 1 Applicatorful vaginally at bedtime. Apply a pea sized amount just inside the vagina nightly for 2 weeks, then twice weekly thereafter), Disp: 42.5 g, Rfl: 12   ezetimibe  (ZETIA ) 10 MG tablet, Take 1 tablet (10 mg total) by mouth daily., Disp: 90 tablet, Rfl: 3   furosemide  (LASIX ) 20 MG tablet, TAKE 1 TABLET BY MOUTH EVERY DAY AS NEEDED, Disp: 90 tablet, Rfl: 2   isosorbide  mononitrate (IMDUR ) 30 MG 24 hr tablet, TAKE 1 TABLET BY MOUTH EVERY DAY, Disp: 90 tablet, Rfl: 3   losartan  (COZAAR ) 100 MG tablet, Take 1 tablet (100 mg total) by mouth daily., Disp: 90 tablet, Rfl: 3   meloxicam (MOBIC) 7.5 MG tablet, Take 1 tablet (7.5 mg total) by mouth daily., Disp: 30 tablet, Rfl: 0   metoprolol  succinate (TOPROL -XL) 100 MG 24 hr tablet, TAKE 1 TABLET BY MOUTH EVERY DAY WITH OR IMMEDIATELY FOLLOWING A MEAL, Disp: 90 tablet, Rfl: 3   nitroGLYCERIN  (NITROSTAT ) 0.4 MG SL tablet, PLACE 1 TABLET UNDER THE TONGUE EVERY 5 MINUTES AS NEEDED FOR CHEST PAIN, Disp: 25 tablet, Rfl: 6   pravastatin  (PRAVACHOL ) 40 MG tablet, TAKE 1 TABLET BY MOUTH EVERY DAY IN THE EVENING, Disp: 90 tablet, Rfl: 3   spironolactone  (ALDACTONE ) 25 MG tablet, TAKE  1/2 TABLET BY MOUTH EVERY DAY, Disp: 45 tablet, Rfl: 1   traMADol  (ULTRAM ) 50 MG tablet, Take 0.5 tablets (25 mg total) by mouth every 12 (twelve) hours as needed., Disp: 30 tablet, Rfl: 1   triamcinolone  cream (KENALOG ) 0.1 %, Apply topically daily as needed (for leg dermatitis.)., Disp: 30 g, Rfl: 1   pantoprazole  (PROTONIX ) 40 MG tablet, TAKE 1 TABLET BY MOUTH EVERY DAY (Patient not taking: Reported on 03/07/2024), Disp: 90 tablet, Rfl: 2  Review of Systems:  Negative unless indicated in HPI.   Physical Exam: Vitals:   03/07/24 1128  BP:  120/68  Pulse: 78  Temp: 97.6 F (36.4 C)  TempSrc: Oral  SpO2: 96%  Weight: 219 lb 11.2 oz (99.7 kg)    Body mass index is 37.71 kg/m.   Impression and Plan:  Left hip pain -     Meloxicam; Take 1 tablet (7.5 mg total) by mouth daily.  Dispense: 30 tablet; Refill: 0  -Discussed icing and a 10-day course of meloxicam.  If not better can consider orthopedics referral.   Time spent:23 minutes reviewing chart, interviewing and examining patient and formulating plan of care.     Marguerita Shih, MD [facility]

## 2024-03-18 ENCOUNTER — Ambulatory Visit: Attending: Family Medicine

## 2024-03-18 ENCOUNTER — Ambulatory Visit: Admitting: Obstetrics and Gynecology

## 2024-03-18 ENCOUNTER — Encounter: Payer: Self-pay | Admitting: Acute Care

## 2024-03-18 DIAGNOSIS — I890 Lymphedema, not elsewhere classified: Secondary | ICD-10-CM | POA: Diagnosis not present

## 2024-03-18 NOTE — Therapy (Addendum)
 OUTPATIENT PHYSICAL THERAPY  LOWER EXTREMITY ONCOLOGY TREATMENT  Patient Name: Tracy [name] MRN: [id number] DOB:[birth year], 78 y.o., female Today's Date: 03/18/2024  END OF SESSION:  PT End of Session - 03/18/24 1203     Visit Number 2    Number of Visits 19    Date for PT Re-Evaluation 04/16/24    Authorization Type MCR    Progress Note Due on Visit 10    PT Start Time 1203    PT Stop Time 1250    PT Time Calculation (min) 47 min    Activity Tolerance Patient tolerated treatment well;Patient limited by pain;Treatment limited secondary to medical complications (Comment)    Behavior During Therapy [facility] for tasks assessed/performed             Past Medical History:  Diagnosis Date   Allergic rhinitis    Anxiety    Arthritis    Ascending aorta dilation (HCC) 09/13/2022   Echo 09/12/2022: EF 50-55, GR 1 DD, mildly reduced RVSF, normal PASP (RVSP 17.7), mild dilation of ascending aorta (38 mm), RAP 3   Breast cancer (HCC) 04/28/2020   rigtht breast   Bronchitis    CAD (coronary artery disease)    1/19 PCI/DES to pLCX for ISR, normal EF.    Cataract    Cervical dysplasia    unsure of procedure, possible "burning" in her late 110s   CHF (congestive heart failure) (HCC)    Echo 06/2019: EF 55-60, elevated LVEDP, normal RV SF, mild MAC, mild MR, trivial TR   Complication of anesthesia    COPD (chronic obstructive pulmonary disease) (HCC)    early    Depression    GERD (gastroesophageal reflux disease)    Gout    Hyperlipidemia    Hypertension    Low back pain    Overactive bladder    PONV (postoperative nausea and vomiting)    Sleep apnea    Past Surgical History:  Procedure Laterality Date   ANGIOPLASTY     stent 2007   BREAST EXCISIONAL BIOPSY Right 04/2020   ADH/LCIS   BREAST LUMPECTOMY WITH RADIOACTIVE SEED LOCALIZATION Right 04/28/2020   Procedure: RIGHT BREAST LUMPECTOMY X 2  WITH RADIOACTIVE SEED LOCALIZATION;  Surgeon: Dareen Ebbing, MD;  Location:  [facility];  Service: General;  Laterality: Right;  LMA   CARDIAC CATHETERIZATION     COLONOSCOPY     CORONARY STENT INTERVENTION N/A 11/06/2017   Procedure: CORONARY STENT INTERVENTION;  Surgeon: Sammy Crisp, MD;  Location: [facility];  Service: Cardiovascular;  Laterality: N/A;   LEFT HEART CATH AND CORONARY ANGIOGRAPHY N/A 11/06/2017   Procedure: LEFT HEART CATH AND CORONARY ANGIOGRAPHY;  Surgeon: Sammy Crisp, MD;  Location: [facility];  Service: Cardiovascular;  Laterality: N/A;   LEFT HEART CATH AND CORONARY ANGIOGRAPHY N/A 07/24/2019   Procedure: LEFT HEART CATH AND CORONARY ANGIOGRAPHY;  Surgeon: Odie Benne, MD;  Location: [facility];  Service: Cardiovascular;  Laterality: N/A;   RIGHT/LEFT HEART CATH AND CORONARY ANGIOGRAPHY N/A 09/29/2020   Procedure: RIGHT/LEFT HEART CATH AND CORONARY ANGIOGRAPHY;  Surgeon: Arty Binning, MD;  Location: [facility];  Service: Cardiovascular;  Laterality: N/A;   ROBOTIC ASSISTED BILATERAL SALPINGO OOPHERECTOMY Bilateral 11/10/2020   Procedure: XI ROBOTIC ASSISTED BILATERAL SALPINGO OOPHORECTOMY WITH MINI LAPAROTOMY FOR DRAINAGE;  Surgeon: Suzi Essex, MD;  Location: [facility];  Service: Gynecology;  Laterality: Bilateral;  MINI LAP FIRST   TONSILLECTOMY  VULVECTOMY N/A 11/10/2020   Procedure: WIDE EXCISION VULVECTOMY;  Surgeon: Suzi Essex, MD;  Location: [facility];  Service: Gynecology;  Laterality: N/A;   Patient Active Problem List   Diagnosis Date Noted   Weakness 10/05/2023   Bronchopneumonia 10/05/2023   Multifocal pneumonia 09/29/2023   Hypokalemia 08/07/2023   Intertrigo 04/18/2023   Venous stasis dermatitis of both lower extremities 11/28/2022   Ascending aorta dilation (HCC) 09/13/2022   Dizziness 01/19/2022   Frequent falls 01/19/2022   Tremor of both hands 08/16/2021   Aortic atherosclerosis (HCC) 06/15/2021   Generalized osteoarthritis of multiple  sites 12/25/2020   Chronic pain disorder 12/25/2020   Pelvic mass in female    Bilateral lower extremity edema 04/13/2020   Shortness of breath    Anxiety disorder 10/29/2018   CKD (chronic kidney disease), stage III (HCC) 06/27/2018   COPD (chronic obstructive pulmonary disease) (HCC) 05/25/2018   Obesity 03/20/2018   Dyspnea on exertion 11/06/2017   Abnormal stress test 11/06/2017   Chronic heart failure with preserved ejection fraction (HCC) 09/04/2017   Left ventricular dysfunction 07/31/2017   Cataract, nuclear sclerotic senile, bilateral 02/01/2017   Gouty arthritis of toe of left foot 08/09/2016   Hyperuricemia 08/09/2016   Osteoarthritis (arthritis due to wear and tear of joints) 01/14/2014   Atherosclerosis of native coronary artery of native heart with angina pectoris (HCC) 08/02/2011   Mitral regurgitation 08/02/2011   OVERACTIVE BLADDER 07/02/2010   ACUTE CYSTITIS 05/25/2010   Mixed hyperlipidemia 08/11/2009   Leg edema, left 07/22/2009   GERD 04/11/2008   Constipation 04/11/2008   Essential hypertension 05/30/2007   MYOCARDIAL INFARCTION, HX OF 05/30/2007   Allergic rhinitis 05/30/2007   LOW BACK PAIN 05/30/2007     REFERRING PROVIDER: Betty Swaziland, MD  REFERRING DIAG: I89.0  THERAPY DIAG:  Lymphedema, not elsewhere classified  ONSET DATE: unknown  Rationale for Evaluation and Treatment: Rehabilitation  SUBJECTIVE:                                                                                                                                                                                           SUBJECTIVE STATEMENT: I got the [employer] garments Deborrah Fam had told me about last time and brought those today. My Lt leg continues to hurt like usual.    PERTINENT HISTORY: HTN, CAD, COPD, OA, CKD stage 3, Controlled CHF but does have some soft pitting edema bilaterally. 11/10/20: Robotic BSO and removal of left labial mass and fallopian tube 18cm cystic mass,  pathology showed serous cystadenoma and benign hidradenoma papilliferum. Size difference noted even back in 2020 with negative doppler for DVT. Recurrent cellultis  in the leg.   PAIN:  Are you having pain? Yes NPRS scale: 7-8/10 Pain location: mid calf and down   Pain orientation: Left  PAIN TYPE: aching, dull, and tight, sharp Pain description: constant  Aggravating factors: pushing on it, sitting still too long  Relieving factors: when I compress it like an unna boot   PRECAUTIONS: possible active infection left leg, Fall risk  RED FLAGS: None   WEIGHT BEARING RESTRICTIONS: No  FALLS:  Has patient fallen in last 6 months? Yes. Number of falls 1  LIVING ENVIRONMENT: Lives with: lives with their family and lives with their spouse Lives in: House/apartment Has following equipment at home: Walker - 4 wheeled and SPC  I don't climb the steps anymore    OCCUPATION: reitred   LEISURE: not addressed   PRIOR LEVEL OF FUNCTION: Independent with basic ADLs  PATIENT GOALS: decrease leg pain and infections   OBJECTIVE: Note: Objective measures were completed at Evaluation unless otherwise noted.  COGNITION: Overall cognitive status: Within functional limits for tasks assessed   PALPATION: +1-2 pitting bil Lower extremities from foot to knee  OBSERVATIONS / OTHER ASSESSMENTS: Lt LE is larger than Rt up to groin.  Rt leg has edema evident mostly around the ankle with indentions from her shoe and mild redness.  Lt leg is a brighter red color.  Also some scaly hyperkeratosis on the front of the left leg and some lymphorrhea bumps on the back of the leg.       LYMPHEDEMA ASSESSMENTS:   LOWER EXTREMITY LANDMARK RIGHT eval  At groin   30 cm proximal to suprapatella   20 cm proximal to suprapatella   10 cm proximal to suprapatella   At midpatella / popliteal crease   30 cm proximal to floor at lateral plantar foot   20 cm proximal to floor at lateral plantar foot   10 cm  proximal to floor at lateral plantar foot   Circumference of ankle/heel   5 cm proximal to 1st MTP joint   Across MTP joint   Around proximal great toe   (Blank rows = not tested)  LOWER EXTREMITY LANDMARK LEFT eval  At groin   30 cm proximal to suprapatella   20 cm proximal to suprapatella   10 cm proximal to suprapatella   At midpatella / popliteal crease   30 cm proximal to floor at lateral plantar foot   20 cm proximal to floor at lateral plantar foot   10 cm proximal to floor at lateral plantar foot   Circumference of ankle/heel   5 cm proximal to 1st MTP joint   Across MTP joint   Around proximal great toe   (Blank rows = not tested)  GAIT: Distance walked: walks with rollator walker, slow gait  Outcome measure:  TREATMENT DATE:  03/18/24: Self Care Spent time at beginning of session discussing complete decongestive therapy treatment and s/s she would need to be aware of as we begin CDT treatment. Like SOB due to cardiac issues. Also discussed how we aren't sure this is pure lymphedema as pt does have pain which isn't typical of lymphedema so if she does have another issue, like arterial or more so cardiac edema, then our treatment expectations will be adjusted accordingly. Pt verbalized understanding.  Manual Therapy Upon inspection pts Lt lower leg is very red today and initially she reports this is usual for her so began modified manual lymph drainage today working only to proximal aspect of limb but during session pt reports she is worried her leg is infected because redness has been worsening by being redder than it was (was more purple in appearance at eval) and pt reports this is worsening up her anterior leg. Stopped MLD at this point and took pictures for pts chart and inbox messaged Dr. Swaziland. Also suggested to pt since she was right here near the  doctor that she should try to just go to the office now. Pt understood and agreed. Ended session early due to this. 03/19/24 Addendum: Pt was able to see MD who prescribed antibiotics and scheduled her for an ABI.     Eval performed Discussed POC Discussed what lymphedema is and how the only way to manage this is with compression that she finds comfortable and wearable.    PATIENT EDUCATION:  Education details: Aspects of CDT, POC, Person educated: Patient Education method: Explanation Education comprehension: verbalized understanding  HOME EXERCISE PROGRAM: Use her velcro garment from wound care center, try to sleep out of the recliner   ASSESSMENT:  CLINICAL IMPRESSION: See above objective note. Held off on beginning compression and ended MLD early due to pt reporting during session she thinks her leg is infected and in fact may be spreading. Dr. Swaziland inbox messaged and pictures taken. Advised pt that she may need to cancel her Wed appt if her pain and redness has not improved bc we can worsen this otherwise. She verbalized understanding.    OBJECTIVE IMPAIRMENTS: decreased knowledge of condition, decreased knowledge of use of DME, decreased mobility, and increased edema.   ACTIVITY LIMITATIONS: locomotion level  PARTICIPATION LIMITATIONS: community activity  PERSONAL FACTORS: Time since onset of injury/illness/exacerbation and 1-2 comorbidities: CHF, CKD are also affecting patient's functional outcome.   REHAB POTENTIAL: Good  CLINICAL DECISION MAKING: Evolving/moderate complexity  EVALUATION COMPLEXITY: Moderate   GOALS: Goals reviewed with patient? Yes  SHORT TERM GOALS: Target date: 03/26/24  Pt will be educated on what lymphedema is and how it is best managed  Baseline: Goal status: INITIAL  2.  Pt will be educated on risk reduction and infection prevention including skin care Baseline:  Goal status: INITIAL   LONG TERM GOALS: Target date: 04/16/24  Pt will be  measured for final compression for bil lower extremities  Baseline:  Goal status: INITIAL  2.  Pt will be educated on self MLD if able Baseline:  Goal status: INITIAL  3.  Pt will be ind with remedial exercise  Baseline:  Goal status: INITIAL   PLAN:  PT FREQUENCY: 3x/week  PT DURATION: 6 weeks  PLANNED INTERVENTIONS: 97110-Therapeutic exercises, 97530- Therapeutic activity, [id number]- Neuromuscular re-education, 97140- Manual therapy, Patient/Family education, Manual lymph drainage, Compression bandaging, DME instructions, and Self Care  PLAN FOR NEXT SESSION: *Remeasure bil legs - eval measurements lost before being  recorded*  How is leg with antibiotics?  Lt lower leg gentle bandaging with education on rememdial exercise, watching for s/s of worsening kidney or heart issues. Also pt has pending ABI so we will stick with less bandaging.    Denyce Flank, PTA 03/18/2024, 1:18 PM

## 2024-03-19 ENCOUNTER — Encounter: Payer: Self-pay | Admitting: Family Medicine

## 2024-03-19 ENCOUNTER — Ambulatory Visit (INDEPENDENT_AMBULATORY_CARE_PROVIDER_SITE_OTHER): Admitting: Family Medicine

## 2024-03-19 VITALS — BP 130/80 | HR 100 | Resp 16 | Ht 64.0 in | Wt 219.2 lb

## 2024-03-19 DIAGNOSIS — R251 Tremor, unspecified: Secondary | ICD-10-CM | POA: Diagnosis not present

## 2024-03-19 DIAGNOSIS — I872 Venous insufficiency (chronic) (peripheral): Secondary | ICD-10-CM

## 2024-03-19 DIAGNOSIS — I10 Essential (primary) hypertension: Secondary | ICD-10-CM | POA: Diagnosis not present

## 2024-03-19 DIAGNOSIS — G8929 Other chronic pain: Secondary | ICD-10-CM | POA: Diagnosis not present

## 2024-03-19 DIAGNOSIS — M545 Low back pain, unspecified: Secondary | ICD-10-CM | POA: Diagnosis not present

## 2024-03-19 DIAGNOSIS — R0989 Other specified symptoms and signs involving the circulatory and respiratory systems: Secondary | ICD-10-CM | POA: Diagnosis not present

## 2024-03-19 MED ORDER — TRAMADOL HCL 50 MG PO TABS: 25.0000 mg | ORAL_TABLET | Freq: Two times a day (BID) | ORAL | 2 refills | Status: DC | PRN

## 2024-03-19 MED ORDER — CEPHALEXIN 500 MG PO CAPS: 500.0000 mg | ORAL_CAPSULE | Freq: Three times a day (TID) | ORAL | 0 refills | Status: AC

## 2024-03-19 MED ORDER — TRIAMCINOLONE ACETONIDE 0.1 % EX CREA: TOPICAL_CREAM | Freq: Every day | CUTANEOUS | 1 refills | Status: AC | PRN

## 2024-03-19 NOTE — Assessment & Plan Note (Signed)
 L>>R, present at rest and she feels like getting worse. She prefers to hold on neuro re-evaluation for now.

## 2024-03-19 NOTE — Patient Instructions (Addendum)
 A few things to remember from today's visit:  Decreased dorsalis pedis pulse - Plan: VAS US  ABI WITH/WO TBI  Chronic low back pain without sciatica, unspecified back pain laterality - Plan: traMADol  (ULTRAM ) 50 MG tablet  Venous stasis dermatitis of both lower extremities - Plan: triamcinolone  cream (KENALOG ) 0.1 %  Continue fluid pill, topical steroid,and elevation. Antibiotic for possible cellulitis. Take Tramadol  daily as needed for pain. Left leg pain could be related to your back.  If you need refills for medications you take chronically, please call your pharmacy. Do not use My Chart to request refills or for acute issues that need immediate attention. If you send a my chart message, it may take a few days to be addressed, specially if I am not in the office.  Please be sure medication list is accurate. If a new problem present, please set up appointment sooner than planned today.

## 2024-03-19 NOTE — Assessment & Plan Note (Signed)
 We discussed differential Dx. She feels like her LLE erythema is worse and having severe pain, so will treat for possible cellulitis with Cephalexin . Continue good skin care, Furosemide  20 mg daily, and topical Triamcinolone  cream. She has not tolerated compression stocking. Pending to start treatment at the lymphedema clinic.

## 2024-03-19 NOTE — Assessment & Plan Note (Signed)
 Tramadol  50 mg 1/2 to 1 tablet has helped with pain, still getting just 7 tab at her pharmacy. Because her hx of CKD, I do not recommend NSAID's. LLE pain could be related to this problem. Some side effects of med discussed. F/U in 3 months.

## 2024-03-19 NOTE — Progress Notes (Unsigned)
 ACUTE VISIT Chief Complaint  Patient presents with   Leg Swelling    Left leg, pain & swelling. Pt is in lymphedema therapy but therapist wanted leg checked first to make sure no infection.    HPI: Ms.Oriel L Beitz is a 78 y.o. female with a PMHx of HTN, HLD, Chronic Heart Failure, MI, COPD, GERD, CKD lll, among others who is here today complaining of swelling and erythema in her leg.   Leg swelling/pain/back pain:  Pt is complaining of worsening swelling,  erythema, and pain in both legs. Also states her legs itch.  She's supposed to be seeing Physical Therapy but they would not see her until she was evaluated here for possible infection.  Currently on Lasix  20 mg which she has been taking daily but states it's not working well.  Not sleeping well because of the pain. Has not been able to exercise. Also reports losing her sense of concentration.  Also experiencing shaking of her hand.  Was seen on 5/22 by Dr.Hernandez for hip and back pain and placed on Mobic . She is still experiencing this pain and states its radiating to her legs.  Is prescribed Tramadol  however endorses not taking it regularly. When she does take it, she feels its helpful.  Was seen in Urgent Care on May 11 for lymphedema and was placed on Prednisone  for 5 days.  Plans to f/up with orthopedics in the near future.   Hypertension:  Medications: Spironolactone  12.5 mg daily, Toprol -XL100 mg daily, Losartan  100 mg daily. Side effects: none  Negative for unusual or severe headache, visual changes, exertional chest pain, dyspnea,  focal weakness, or edema.  Lab Results  Component Value Date   CREATININE 1.51 (H) 11/13/2023   BUN 52 (H) 11/13/2023   NA 138 11/13/2023   K 4.1 11/13/2023   CL 105 11/13/2023   CO2 27 11/13/2023   Mentions she has ahd loose stools, exacerbated by food intake. Occasional "accidents" *** Chronic.  Review of Systems  Constitutional:  Positive for fatigue.  Skin:  Positive  for rash.  Psychiatric/Behavioral:  The patient is nervous/anxious.    See other pertinent positives and negatives in HPI.  Current Outpatient Medications on File Prior to Visit  Medication Sig Dispense Refill   acetaminophen  (TYLENOL ) 650 MG CR tablet Take 1,300 mg by mouth 2 (two) times daily as needed for pain.     allopurinol  (ZYLOPRIM ) 100 MG tablet TAKE 1 TABLET BY MOUTH EVERY DAY 90 tablet 1   chlorthalidone  (HYGROTON ) 25 MG tablet TAKE 1 TABLET (25 MG TOTAL) BY MOUTH DAILY. 90 tablet 2   clopidogrel  (PLAVIX ) 75 MG tablet TAKE 1 TABLET BY MOUTH EVERY DAY WITH BREAKFAST 90 tablet 2   estradiol  (ESTRACE  VAGINAL) 0.1 MG/GM vaginal cream Apply a pea sized amount just inside the vagina nightly for 2 weeks, then twice weekly thereafter (Patient taking differently: Place 1 Applicatorful vaginally at bedtime. Apply a pea sized amount just inside the vagina nightly for 2 weeks, then twice weekly thereafter) 42.5 g 12   ezetimibe  (ZETIA ) 10 MG tablet Take 1 tablet (10 mg total) by mouth daily. 90 tablet 3   furosemide  (LASIX ) 20 MG tablet TAKE 1 TABLET BY MOUTH EVERY DAY AS NEEDED 90 tablet 2   isosorbide  mononitrate (IMDUR ) 30 MG 24 hr tablet TAKE 1 TABLET BY MOUTH EVERY DAY 90 tablet 3   losartan  (COZAAR ) 100 MG tablet Take 1 tablet (100 mg total) by mouth daily. 90 tablet 3   meloxicam  (  MOBIC ) 7.5 MG tablet Take 1 tablet (7.5 mg total) by mouth daily. 30 tablet 0   metoprolol  succinate (TOPROL -XL) 100 MG 24 hr tablet TAKE 1 TABLET BY MOUTH EVERY DAY WITH OR IMMEDIATELY FOLLOWING A MEAL 90 tablet 3   nitroGLYCERIN  (NITROSTAT ) 0.4 MG SL tablet PLACE 1 TABLET UNDER THE TONGUE EVERY 5 MINUTES AS NEEDED FOR CHEST PAIN 25 tablet 6   pantoprazole  (PROTONIX ) 40 MG tablet TAKE 1 TABLET BY MOUTH EVERY DAY 90 tablet 2   pravastatin  (PRAVACHOL ) 40 MG tablet TAKE 1 TABLET BY MOUTH EVERY DAY IN THE EVENING 90 tablet 3   spironolactone  (ALDACTONE ) 25 MG tablet TAKE 1/2 TABLET BY MOUTH EVERY DAY 45 tablet 1    No current facility-administered medications on file prior to visit.    Past Medical History:  Diagnosis Date   Allergic rhinitis    Anxiety    Arthritis    Ascending aorta dilation (HCC) 09/13/2022   Echo 09/12/2022: EF 50-55, GR 1 DD, mildly reduced RVSF, normal PASP (RVSP 17.7), mild dilation of ascending aorta (38 mm), RAP 3   Breast cancer (HCC) 04/28/2020   rigtht breast   Bronchitis    CAD (coronary artery disease)    1/19 PCI/DES to pLCX for ISR, normal EF.    Cataract    Cervical dysplasia    unsure of procedure, possible "burning" in her late 57s   CHF (congestive heart failure) (HCC)    Echo 06/2019: EF 55-60, elevated LVEDP, normal RV SF, mild MAC, mild MR, trivial TR   Complication of anesthesia    COPD (chronic obstructive pulmonary disease) (HCC)    early    Depression    GERD (gastroesophageal reflux disease)    Gout    Hyperlipidemia    Hypertension    Low back pain    Overactive bladder    PONV (postoperative nausea and vomiting)    Sleep apnea    Allergies  Allergen Reactions   Ak-Mycin [Erythromycin] Rash   Sulfamethoxazole Rash   Social History   Socioeconomic History   Marital status: Married    Spouse name: Not on file   Number of children: 2   Years of education: Not on file   Highest education level: 12th grade  Occupational History   Occupation: retired   Occupation: retired    Comment: Production designer, theatre/television/film  Tobacco Use   Smoking status: Former    Current packs/day: 0.00    Average packs/day: 2.0 packs/day for 52.0 years (104.0 ttl pk-yrs)    Types: Cigarettes    Start date: 1966    Quit date: 2018    Years since quitting: 7.4   Smokeless tobacco: Never   Tobacco comments:    completely quit May of 2018; period of years she did not smoke   Vaping Use   Vaping status: Never Used  Substance and Sexual Activity   Alcohol use: Yes    Comment: seldom   Drug use: No   Sexual activity: Not Currently  Other Topics Concern   Not on  file  Social History Narrative   Not on file   Social Drivers of Health   Financial Resource Strain: Medium Risk (11/13/2023)   Overall Financial Resource Strain (CARDIA)    Difficulty of Paying Living Expenses: Somewhat hard  Food Insecurity: Patient Declined (11/13/2023)   Hunger Vital Sign    Worried About Running Out of Food in the Last Year: Patient declined    Ran Out of Food in the Last  Year: Patient declined  Transportation Needs: No Transportation Needs (11/13/2023)   PRAPARE - Administrator, Civil Service (Medical): No    Lack of Transportation (Non-Medical): No  Physical Activity: Unknown (11/13/2023)   Exercise Vital Sign    Days of Exercise per Week: 0 days    Minutes of Exercise per Session: Not on file  Stress: Stress Concern Present (11/13/2023)   [employer] of Occupational Health - Occupational Stress Questionnaire    Feeling of Stress : To some extent  Social Connections: Unknown (11/13/2023)   Social Connection and Isolation Panel [NHANES]    Frequency of Communication with Friends and Family: Three times a week    Frequency of Social Gatherings with Friends and Family: Patient declined    Attends Religious Services: Patient declined    Active Member of Clubs or Organizations: No    Attends Banker Meetings: Not on file    Marital Status: Married   Vitals:   03/19/24 1246  BP: 130/80  Pulse: 100  Resp: 16  SpO2: 94%   Body mass index is 37.63 kg/m.  Physical Exam Vitals and nursing note reviewed.  Constitutional:      General: She is not in acute distress.    Appearance: She is well-developed.  HENT:     Head: Normocephalic and atraumatic.     Mouth/Throat:     Mouth: Mucous membranes are moist.     Pharynx: Oropharynx is clear.  Eyes:     Conjunctiva/sclera: Conjunctivae normal.  Cardiovascular:     Rate and Rhythm: Normal rate and regular rhythm.     Heart sounds: No murmur heard.    Comments: Difficult finding  DP pulses, good capillary refill. Pulmonary:     Effort: Pulmonary effort is normal. No respiratory distress.     Breath sounds: Normal breath sounds.  Musculoskeletal:     Right lower leg: Swelling present.     Left lower leg: Swelling present.     Comments: *** Bilateral lower extremity erythema L>R  Lymphadenopathy:     Cervical: No cervical adenopathy.  Skin:    General: Skin is warm.     Findings: Erythema present.     Comments: Distal LLE erythema with lichenification changes, local heat, and scattered vesicular lesions posterior calf.  Minimal erythema pretibial RLE. Forefoot erythema bilateral and hypertrophic toenails.   Neurological:     General: No focal deficit present.     Mental Status: She is alert and oriented to person, place, and time.     Cranial Nerves: No cranial nerve deficit.     Gait: Gait normal.  Psychiatric:     Comments: Well groomed, good eye contact.     ASSESSMENT AND PLAN:  Ms.Brilliant was seen today for redness of her leg:     Decreased dorsalis pedis pulse -     VAS US  ABI WITH/WO TBI; Future  Chronic low back pain without sciatica, unspecified back pain laterality Assessment & Plan: Tramadol  50 mg 1/2 to 1 tablet has helped with pain, still getting just 7 tab at her pharmacy. Because her hx of CKD, I do not recommend NSAID's. LLE pain could be related to this problem. Some side effects of med discussed. F/U in 3 months.  Orders: -     traMADol  HCl; Take 0.5 tablets (25 mg total) by mouth every 12 (twelve) hours as needed.  Dispense: 30 tablet; Refill: 2  Venous stasis dermatitis of both lower extremities Assessment & Plan: We  discussed differential Dx. She feels like her LLE erythema is worse and having severe pain, so will treat for possible cellulitis with Cephalexin . Continue good skin care, Furosemide  20 mg daily, and topical Triamcinolone  cream. She has not tolerated compression stocking. Pending to start treatment at the  lymphedema clinic.  Orders: -     Triamcinolone  Acetonide; Apply topically daily as needed (for leg dermatitis.).  Dispense: 45 g; Refill: 1  Other orders -     Cephalexin ; Take 1 capsule (500 mg total) by mouth 3 (three) times daily for 7 days.  Dispense: 21 capsule; Refill: 0    Return in about 3 months (around 06/19/2024) for chronic problems.  I, Fritz Jewel Wierda, acting as a scribe for Sylvana Bonk [place], MD., have documented all relevant documentation on the behalf of Jameyah Fennewald [place], MD, as directed by  Ved Martos [place], MD while in the presence of Damondre Pfeifle [place], MD.   I, Carletta Feasel [place], MD, have reviewed all documentation for this visit. The documentation on 03/19/24 for the exam, diagnosis, procedures, and orders are all accurate and complete.   Demarko Zeimet G. [place], MD  [facility]. [place] office.

## 2024-03-20 ENCOUNTER — Ambulatory Visit: Admitting: Physical Therapy

## 2024-03-21 NOTE — Assessment & Plan Note (Signed)
 BP adequately controlled. Spironolactone  12.5 mg daily, Imdur  30 mg daily, chlorthalidone  25 mg daily, Toprol -XL100 mg daily, Losartan  100 mg daily. Continue low salt diet.

## 2024-03-22 ENCOUNTER — Ambulatory Visit

## 2024-03-22 ENCOUNTER — Telehealth: Payer: Self-pay | Admitting: Rehabilitation

## 2024-03-22 NOTE — Telephone Encounter (Signed)
 We will plan on holding lymphedema treatment until after her ABI imaging for compression safety.

## 2024-03-25 ENCOUNTER — Ambulatory Visit: Admitting: Rehabilitation

## 2024-03-27 ENCOUNTER — Ambulatory Visit: Admitting: Rehabilitation

## 2024-03-29 ENCOUNTER — Encounter: Admitting: Rehabilitation

## 2024-04-01 ENCOUNTER — Encounter

## 2024-04-03 ENCOUNTER — Encounter

## 2024-04-03 ENCOUNTER — Other Ambulatory Visit: Payer: Self-pay | Admitting: Internal Medicine

## 2024-04-03 DIAGNOSIS — M25552 Pain in left hip: Secondary | ICD-10-CM

## 2024-04-05 ENCOUNTER — Encounter

## 2024-04-08 ENCOUNTER — Encounter: Admitting: Rehabilitation

## 2024-04-09 ENCOUNTER — Ambulatory Visit (HOSPITAL_COMMUNITY)
Admission: RE | Admit: 2024-04-09 | Discharge: 2024-04-09 | Disposition: A | Discharge status: Home / Self Care | Source: Ambulatory Visit | Attending: Family Medicine | Admitting: Family Medicine

## 2024-04-09 DIAGNOSIS — R0989 Other specified symptoms and signs involving the circulatory and respiratory systems: Secondary | ICD-10-CM

## 2024-04-09 LAB — VAS US ABI WITH/WO TBI
Left ABI: 0.97
Right ABI: 0.93

## 2024-04-10 ENCOUNTER — Encounter

## 2024-04-12 ENCOUNTER — Ambulatory Visit

## 2024-04-12 DIAGNOSIS — I890 Lymphedema, not elsewhere classified: Secondary | ICD-10-CM

## 2024-04-12 NOTE — Therapy (Signed)
 OUTPATIENT PHYSICAL THERAPY  LOWER EXTREMITY ONCOLOGY TREATMENT  Patient Name: Tracy [name] MRN: [id number] DOB:Oct 07, 1946, 78 y.o., female Today's Date: 04/12/2024  END OF SESSION:  PT End of Session - 04/12/24 0906     Visit Number 3    Number of Visits 19    Date for PT Re-Evaluation 04/16/24    Authorization Type MCR    Progress Note Due on Visit 10    PT Start Time 0902    PT Stop Time 0958    PT Time Calculation (min) 56 min    Activity Tolerance Patient tolerated treatment well    Behavior During Therapy [facility] for tasks assessed/performed          Past Medical History:  Diagnosis Date   Allergic rhinitis    Anxiety    Arthritis    Ascending aorta dilation (HCC) 09/13/2022   Echo 09/12/2022: EF 50-55, GR 1 DD, mildly reduced RVSF, normal PASP (RVSP 17.7), mild dilation of ascending aorta (38 mm), RAP 3   Breast cancer (HCC) 04/28/2020   rigtht breast   Bronchitis    CAD (coronary artery disease)    1/19 PCI/DES to pLCX for ISR, normal EF.    Cataract    Cervical dysplasia    unsure of procedure, possible burning in her late 67s   CHF (congestive heart failure) (HCC)    Echo 06/2019: EF 55-60, elevated LVEDP, normal RV SF, mild MAC, mild MR, trivial TR   Complication of anesthesia    COPD (chronic obstructive pulmonary disease) (HCC)    early    Depression    GERD (gastroesophageal reflux disease)    Gout    Hyperlipidemia    Hypertension    Low back pain    Overactive bladder    PONV (postoperative nausea and vomiting)    Sleep apnea    Past Surgical History:  Procedure Laterality Date   ANGIOPLASTY     stent 2007   BREAST EXCISIONAL BIOPSY Right 04/2020   ADH/LCIS   BREAST LUMPECTOMY WITH RADIOACTIVE SEED LOCALIZATION Right 04/28/2020   Procedure: RIGHT BREAST LUMPECTOMY X 2  WITH RADIOACTIVE SEED LOCALIZATION;  Surgeon: Belinda Cough, MD;  Location: [facility];  Service: General;  Laterality: Right;  LMA   CARDIAC  CATHETERIZATION     COLONOSCOPY     CORONARY STENT INTERVENTION N/A 11/06/2017   Procedure: CORONARY STENT INTERVENTION;  Surgeon: Mady Bruckner, MD;  Location: [facility];  Service: Cardiovascular;  Laterality: N/A;   LEFT HEART CATH AND CORONARY ANGIOGRAPHY N/A 11/06/2017   Procedure: LEFT HEART CATH AND CORONARY ANGIOGRAPHY;  Surgeon: Mady Bruckner, MD;  Location: [facility];  Service: Cardiovascular;  Laterality: N/A;   LEFT HEART CATH AND CORONARY ANGIOGRAPHY N/A 07/24/2019   Procedure: LEFT HEART CATH AND CORONARY ANGIOGRAPHY;  Surgeon: Verlin Bruckner BIRCH, MD;  Location: [facility];  Service: Cardiovascular;  Laterality: N/A;   RIGHT/LEFT HEART CATH AND CORONARY ANGIOGRAPHY N/A 09/29/2020   Procedure: RIGHT/LEFT HEART CATH AND CORONARY ANGIOGRAPHY;  Surgeon: Claudene Victory ORN, MD;  Location: [facility];  Service: Cardiovascular;  Laterality: N/A;   ROBOTIC ASSISTED BILATERAL SALPINGO OOPHERECTOMY Bilateral 11/10/2020   Procedure: XI ROBOTIC ASSISTED BILATERAL SALPINGO OOPHORECTOMY WITH MINI LAPAROTOMY FOR DRAINAGE;  Surgeon: Viktoria Comer SAUNDERS, MD;  Location: [facility];  Service: Gynecology;  Laterality: Bilateral;  MINI LAP FIRST   TONSILLECTOMY     VULVECTOMY N/A 11/10/2020   Procedure: WIDE EXCISION VULVECTOMY;  Surgeon:  Viktoria Comer SAUNDERS, MD;  Location: [facility];  Service: Gynecology;  Laterality: N/A;   Patient Active Problem List   Diagnosis Date Noted   Weakness 10/05/2023   Bronchopneumonia 10/05/2023   Multifocal pneumonia 09/29/2023   Hypokalemia 08/07/2023   Intertrigo 04/18/2023   Venous stasis dermatitis of both lower extremities 11/28/2022   Ascending aorta dilation (HCC) 09/13/2022   Dizziness 01/19/2022   Frequent falls 01/19/2022   Tremor of both hands 08/16/2021   Aortic atherosclerosis (HCC) 06/15/2021   Generalized osteoarthritis of multiple sites 12/25/2020   Chronic pain disorder 12/25/2020   Pelvic mass in female     Bilateral lower extremity edema 04/13/2020   Shortness of breath    Anxiety disorder 10/29/2018   CKD (chronic kidney disease), stage III (HCC) 06/27/2018   COPD (chronic obstructive pulmonary disease) (HCC) 05/25/2018   Obesity 03/20/2018   Dyspnea on exertion 11/06/2017   Abnormal stress test 11/06/2017   Chronic heart failure with preserved ejection fraction (HCC) 09/04/2017   Left ventricular dysfunction 07/31/2017   Cataract, nuclear sclerotic senile, bilateral 02/01/2017   Gouty arthritis of toe of left foot 08/09/2016   Hyperuricemia 08/09/2016   Osteoarthritis (arthritis due to wear and tear of joints) 01/14/2014   Atherosclerosis of native coronary artery of native heart with angina pectoris (HCC) 08/02/2011   Mitral regurgitation 08/02/2011   OVERACTIVE BLADDER 07/02/2010   ACUTE CYSTITIS 05/25/2010   Mixed hyperlipidemia 08/11/2009   Leg edema, left 07/22/2009   GERD 04/11/2008   Constipation 04/11/2008   Essential hypertension 05/30/2007   MYOCARDIAL INFARCTION, HX OF 05/30/2007   Allergic rhinitis 05/30/2007   LOW BACK PAIN 05/30/2007     REFERRING PROVIDER: Betty Swaziland, MD  REFERRING DIAG: I89.0  THERAPY DIAG:  Lymphedema, not elsewhere classified  ONSET DATE: unknown  Rationale for Evaluation and Treatment: Rehabilitation  SUBJECTIVE:                                                                                                                                                                                           SUBJECTIVE STATEMENT: I don't have A/C in my house so I'm worried I'll be hot with the bandages on but I'm willing to start trying.  My legs are feeling a lot better since I had the antibiotic. My Lt knee is hurting a lot today but that's chronic from arthritis.   PERTINENT HISTORY: HTN, CAD, COPD, OA, CKD stage 3, Controlled CHF but does have some soft pitting edema bilaterally. 11/10/20: Robotic BSO and removal of left labial mass and  fallopian tube 18cm cystic mass, pathology showed serous cystadenoma and benign hidradenoma papilliferum. Size  difference noted even back in 2020 with negative doppler for DVT. Recurrent cellultis in the leg.   PAIN:  Are you having pain? Yes NPRS scale: up to 6/10 Pain location: upper calf  Pain orientation: Left  PAIN TYPE: tender to touch Pain description: constant  Aggravating factors: pushing on it, sitting still too long  Relieving factors: when I compress it like an unna boot   PRECAUTIONS: possible active infection left leg, Fall risk  RED FLAGS: None   WEIGHT BEARING RESTRICTIONS: No  FALLS:  Has patient fallen in last 6 months? Yes. Number of falls 1  LIVING ENVIRONMENT: Lives with: lives with their family and lives with their spouse Lives in: House/apartment Has following equipment at home: Walker - 4 wheeled and SPC  I don't climb the steps anymore    OCCUPATION: reitred   LEISURE: not addressed   PRIOR LEVEL OF FUNCTION: Independent with basic ADLs  PATIENT GOALS: decrease leg pain and infections   OBJECTIVE: Note: Objective measures were completed at Evaluation unless otherwise noted.  COGNITION: Overall cognitive status: Within functional limits for tasks assessed   PALPATION: +1-2 pitting bil Lower extremities from foot to knee  OBSERVATIONS / OTHER ASSESSMENTS: Lt LE is larger than Rt up to groin.  Rt leg has edema evident mostly around the ankle with indentions from her shoe and mild redness.  Lt leg is a brighter red color.  Also some scaly hyperkeratosis on the front of the left leg and some lymphorrhea bumps on the back of the leg.       LYMPHEDEMA ASSESSMENTS:   LOWER EXTREMITY LANDMARK RIGHT eval  At groin   30 cm proximal to suprapatella   20 cm proximal to suprapatella   10 cm proximal to suprapatella   At midpatella / popliteal crease   30 cm proximal to floor at lateral plantar foot   20 cm proximal to floor at lateral plantar  foot   10 cm proximal to floor at lateral plantar foot   Circumference of ankle/heel   5 cm proximal to 1st MTP joint   Across MTP joint   Around proximal great toe   (Blank rows = not tested)  LOWER EXTREMITY LANDMARK LEFT 04/12/24 In supine today  At groin   30 cm proximal to suprapatella   20 cm proximal to suprapatella   10 cm proximal to suprapatella 52.2  At midpatella / popliteal crease 43.2  30 cm proximal to floor at lateral plantar foot 44.6  20 cm proximal to floor at lateral plantar foot 40.5  10 cm proximal to floor at lateral plantar foot 31.6  Circumference of ankle/heel   5 cm proximal to 1st MTP joint 23.4  Across MTP joint 22.7  Around proximal great toe 7.6  (Blank rows = not tested)  GAIT: Distance walked: walks with rollator walker, slow gait  Outcome measure:  TREATMENT DATE:  04/12/24: Self Care Educated pt about basics of anatomy of lymphatic system and principles of MLD. Also what to expect with bandaging and that we hope she can leave them on until Sunday if they aren't sliding down leg. Then to bring everything back with her to each appt. Manual Therapy MLD to Lt LE: Short neck, 5 diaphragmatic breaths, Lt axillary and pectoral nodes, Lt inguino-axillary anastomosis, then focused on Lt LE working from proximal to distal then retracing all steps.  Compression Bandaging: Cocoa butter, TG soft size med, Molelast to toes 1-4, artiflex from foot to pop fossa, Short strtch compression bandages as follows: 1 - 8 cm combined technique of Roman sandal and ASH pattern, then 1 - 12 cm from ankle to pop fossa and then folded TG soft over top,   03/18/24: Self Care Spent time at beginning of session discussing complete decongestive therapy treatment and s/s she would need to be aware of as we begin CDT treatment. Like SOB due to cardiac issues.  Also discussed how we aren't sure this is pure lymphedema as pt does have pain which isn't typical of lymphedema so if she does have another issue, like arterial or more so cardiac edema, then our treatment expectations will be adjusted accordingly. Pt verbalized understanding.  Manual Therapy Upon inspection pts Lt lower leg is very red today and initially she reports this is usual for her so began modified manual lymph drainage today working only to proximal aspect of limb but during session pt reports she is worried her leg is infected because redness has been worsening by being redder than it was (was more purple in appearance at eval) and pt reports this is worsening up her anterior leg. Stopped MLD at this point and took pictures for pts chart and inbox messaged Dr. Swaziland. Also suggested to pt since she was right here near the doctor that she should try to just go to the office now. Pt understood and agreed. Ended session early due to this. 03/19/24 Addendum: Pt was able to see MD who prescribed antibiotics and scheduled her for an ABI.     Eval performed Discussed POC Discussed what lymphedema is and how the only way to manage this is with compression that she finds comfortable and wearable.    PATIENT EDUCATION:  Education details: Aspects of CDT, POC, Person educated: Patient Education method: Explanation Education comprehension: verbalized understanding  HOME EXERCISE PROGRAM: Use her velcro garment from wound care center, try to sleep out of the recliner   ASSESSMENT:  CLINICAL IMPRESSION: Began CDT today as pts ABI is WNLs. Pt has no A/C at home so is worried about being hot so began with light bandaging to also assess tolerance. Educated her on remedial exercises to perform with bandages on and she returned good demo of this. Handout issued with instructions for when to remove bandages over the weekend, items to purchase to help her clean her foot at dollar store when she removes  them to help her clean off dry, dead skin, and where to purchase a cast/post op shoe. Pt reports bandages comfortable at end of session.  OBJECTIVE IMPAIRMENTS: decreased knowledge of condition, decreased knowledge of use of DME, decreased mobility, and increased edema.   ACTIVITY LIMITATIONS: locomotion level  PARTICIPATION LIMITATIONS: community activity  PERSONAL FACTORS: Time since onset of injury/illness/exacerbation and 1-2 comorbidities: CHF, CKD are also affecting patient's functional outcome.   REHAB POTENTIAL: Good  CLINICAL DECISION MAKING: Evolving/moderate complexity  EVALUATION COMPLEXITY:  Moderate   GOALS: Goals reviewed with patient? Yes  SHORT TERM GOALS: Target date: 03/26/24  Pt will be educated on what lymphedema is and how it is best managed  Baseline: Goal status: INITIAL  2.  Pt will be educated on risk reduction and infection prevention including skin care Baseline:  Goal status: INITIAL   LONG TERM GOALS: Target date: 04/16/24  Pt will be measured for final compression for bil lower extremities  Baseline:  Goal status: INITIAL  2.  Pt will be educated on self MLD if able Baseline:  Goal status: INITIAL  3.  Pt will be ind with remedial exercise  Baseline:  Goal status: INITIAL   PLAN:  PT FREQUENCY: 3x/week  PT DURATION: 6 weeks  PLANNED INTERVENTIONS: 97110-Therapeutic exercises, 97530- Therapeutic activity, [id number]- Neuromuscular re-education, [id number]- Manual therapy, Patient/Family education, Manual lymph drainage, Compression bandaging, DME instructions, and Self Care  PLAN FOR NEXT SESSION:  How was Lt lower leg gentle bandaging? If tolerated well add (6 cm to foot and second 12 cm to lower leg) bandages and foam if pt got a cast/post op shoe; watching for s/s of worsening kidney or heart issues. Also pt has pending ABI so we will stick with less bandaging.    Aden Berwyn Caldron, PTA 04/12/2024, 10:33 AM    Instructions for the  weekend:  Take bandages off Sunday, but sooner if they are sliding down your leg. Put everything, except toe bandages, in a bag and bring back to each appt.   2. [employer] Store: look for long handled loufa or back scrubber brush and a plastic container that you can soak your feet in and scrub with the brush  3. Medical Supply store: Buy a post op or cast shoe. It has a tread on the bottom with adjustable straps on top   Can[facility] [phone]

## 2024-04-15 ENCOUNTER — Ambulatory Visit: Admitting: Rehabilitation

## 2024-04-15 ENCOUNTER — Encounter: Payer: Self-pay | Admitting: Rehabilitation

## 2024-04-15 DIAGNOSIS — I890 Lymphedema, not elsewhere classified: Secondary | ICD-10-CM

## 2024-04-15 NOTE — Therapy (Signed)
 OUTPATIENT PHYSICAL THERAPY  LOWER EXTREMITY ONCOLOGY TREATMENT  Patient Name: Tracy [name] MRN: [id number] DOB:Aug 14, 1946, 78 y.o., female Today's Date: 04/15/2024  END OF SESSION:  PT End of Session - 04/15/24 0958     Visit Number 4    Number of Visits 19    Date for PT Re-Evaluation 05/20/24    PT Start Time 1000    PT Stop Time 1055   bathroom break   PT Time Calculation (min) 55 min    Activity Tolerance Patient tolerated treatment well    Behavior During Therapy [facility] for tasks assessed/performed          Past Medical History:  Diagnosis Date   Allergic rhinitis    Anxiety    Arthritis    Ascending aorta dilation (HCC) 09/13/2022   Echo 09/12/2022: EF 50-55, GR 1 DD, mildly reduced RVSF, normal PASP (RVSP 17.7), mild dilation of ascending aorta (38 mm), RAP 3   Breast cancer (HCC) 04/28/2020   rigtht breast   Bronchitis    CAD (coronary artery disease)    1/19 PCI/DES to pLCX for ISR, normal EF.    Cataract    Cervical dysplasia    unsure of procedure, possible burning in her late 65s   CHF (congestive heart failure) (HCC)    Echo 06/2019: EF 55-60, elevated LVEDP, normal RV SF, mild MAC, mild MR, trivial TR   Complication of anesthesia    COPD (chronic obstructive pulmonary disease) (HCC)    early    Depression    GERD (gastroesophageal reflux disease)    Gout    Hyperlipidemia    Hypertension    Low back pain    Overactive bladder    PONV (postoperative nausea and vomiting)    Sleep apnea    Past Surgical History:  Procedure Laterality Date   ANGIOPLASTY     stent 2007   BREAST EXCISIONAL BIOPSY Right 04/2020   ADH/LCIS   BREAST LUMPECTOMY WITH RADIOACTIVE SEED LOCALIZATION Right 04/28/2020   Procedure: RIGHT BREAST LUMPECTOMY X 2  WITH RADIOACTIVE SEED LOCALIZATION;  Surgeon: Belinda Cough, MD;  Location: [facility];  Service: General;  Laterality: Right;  LMA   CARDIAC CATHETERIZATION     COLONOSCOPY     CORONARY  STENT INTERVENTION N/A 11/06/2017   Procedure: CORONARY STENT INTERVENTION;  Surgeon: Mady Bruckner, MD;  Location: [facility];  Service: Cardiovascular;  Laterality: N/A;   LEFT HEART CATH AND CORONARY ANGIOGRAPHY N/A 11/06/2017   Procedure: LEFT HEART CATH AND CORONARY ANGIOGRAPHY;  Surgeon: Mady Bruckner, MD;  Location: [facility];  Service: Cardiovascular;  Laterality: N/A;   LEFT HEART CATH AND CORONARY ANGIOGRAPHY N/A 07/24/2019   Procedure: LEFT HEART CATH AND CORONARY ANGIOGRAPHY;  Surgeon: Verlin Bruckner BIRCH, MD;  Location: [facility];  Service: Cardiovascular;  Laterality: N/A;   RIGHT/LEFT HEART CATH AND CORONARY ANGIOGRAPHY N/A 09/29/2020   Procedure: RIGHT/LEFT HEART CATH AND CORONARY ANGIOGRAPHY;  Surgeon: Claudene Victory ORN, MD;  Location: [facility];  Service: Cardiovascular;  Laterality: N/A;   ROBOTIC ASSISTED BILATERAL SALPINGO OOPHERECTOMY Bilateral 11/10/2020   Procedure: XI ROBOTIC ASSISTED BILATERAL SALPINGO OOPHORECTOMY WITH MINI LAPAROTOMY FOR DRAINAGE;  Surgeon: Viktoria Comer SAUNDERS, MD;  Location: [facility];  Service: Gynecology;  Laterality: Bilateral;  MINI LAP FIRST   TONSILLECTOMY     VULVECTOMY N/A 11/10/2020   Procedure: WIDE EXCISION VULVECTOMY;  Surgeon: Viktoria Comer SAUNDERS, MD;  Location: [facility];  Service: Gynecology;  Laterality: N/A;   Patient Active Problem List   Diagnosis Date Noted   Weakness 10/05/2023   Bronchopneumonia 10/05/2023   Multifocal pneumonia 09/29/2023   Hypokalemia 08/07/2023   Intertrigo 04/18/2023   Venous stasis dermatitis of both lower extremities 11/28/2022   Ascending aorta dilation (HCC) 09/13/2022   Dizziness 01/19/2022   Frequent falls 01/19/2022   Tremor of both hands 08/16/2021   Aortic atherosclerosis (HCC) 06/15/2021   Generalized osteoarthritis of multiple sites 12/25/2020   Chronic pain disorder 12/25/2020   Pelvic mass in female    Bilateral lower extremity edema 04/13/2020    Shortness of breath    Anxiety disorder 10/29/2018   CKD (chronic kidney disease), stage III (HCC) 06/27/2018   COPD (chronic obstructive pulmonary disease) (HCC) 05/25/2018   Obesity 03/20/2018   Dyspnea on exertion 11/06/2017   Abnormal stress test 11/06/2017   Chronic heart failure with preserved ejection fraction (HCC) 09/04/2017   Left ventricular dysfunction 07/31/2017   Cataract, nuclear sclerotic senile, bilateral 02/01/2017   Gouty arthritis of toe of left foot 08/09/2016   Hyperuricemia 08/09/2016   Osteoarthritis (arthritis due to wear and tear of joints) 01/14/2014   Atherosclerosis of native coronary artery of native heart with angina pectoris (HCC) 08/02/2011   Mitral regurgitation 08/02/2011   OVERACTIVE BLADDER 07/02/2010   ACUTE CYSTITIS 05/25/2010   Mixed hyperlipidemia 08/11/2009   Leg edema, left 07/22/2009   GERD 04/11/2008   Constipation 04/11/2008   Essential hypertension 05/30/2007   MYOCARDIAL INFARCTION, HX OF 05/30/2007   Allergic rhinitis 05/30/2007   LOW BACK PAIN 05/30/2007     REFERRING PROVIDER: Betty Swaziland, MD  REFERRING DIAG: I89.0  THERAPY DIAG:  Lymphedema, not elsewhere classified  ONSET DATE: unknown  Rationale for Evaluation and Treatment: Rehabilitation  SUBJECTIVE:                                                                                                                                                                                           SUBJECTIVE STATEMENT:  I took it off Sunday morning at 2am.  It started to bother me behind the knee.   PERTINENT HISTORY: HTN, CAD, COPD, OA, CKD stage 3, Controlled CHF but does have some soft pitting edema bilaterally. 11/10/20: Robotic BSO and removal of left labial mass and fallopian tube 18cm cystic mass, pathology showed serous cystadenoma and benign hidradenoma papilliferum. Size difference noted even back in 2020 with negative doppler for DVT. Recurrent cellultis in the leg.    PAIN:  Are you having pain? Yes NPRS scale: up to 6/10 Pain location: upper calf  Pain orientation: Left  PAIN TYPE: tender to  touch Pain description: constant  Aggravating factors: pushing on it, sitting still too long  Relieving factors: when I compress it like an unna boot   PRECAUTIONS: possible active infection left leg, Fall risk  RED FLAGS: None   WEIGHT BEARING RESTRICTIONS: No  FALLS:  Has patient fallen in last 6 months? Yes. Number of falls 1  LIVING ENVIRONMENT: Lives with: lives with their family and lives with their spouse Lives in: House/apartment Has following equipment at home: Walker - 4 wheeled and SPC  I don't climb the steps anymore    OCCUPATION: reitred   LEISURE: not addressed   PRIOR LEVEL OF FUNCTION: Independent with basic ADLs  PATIENT GOALS: decrease leg pain and infections   OBJECTIVE: Note: Objective measures were completed at Evaluation unless otherwise noted.  COGNITION: Overall cognitive status: Within functional limits for tasks assessed   PALPATION: +1-2 pitting bil Lower extremities from foot to knee  OBSERVATIONS / OTHER ASSESSMENTS: Lt LE is larger than Rt up to groin.  Rt leg has edema evident mostly around the ankle with indentions from her shoe and mild redness.  Lt leg is a brighter red color.  Also some scaly hyperkeratosis on the front of the left leg and some lymphorrhea bumps on the back of the leg.       LYMPHEDEMA ASSESSMENTS:   LOWER EXTREMITY LANDMARK RIGHT 04/15/24  At groin   30 cm proximal to suprapatella   20 cm proximal to suprapatella   10 cm proximal to suprapatella   At midpatella / popliteal crease   30 cm proximal to floor at lateral plantar foot 39  20 cm proximal to floor at lateral plantar foot 29.5  10 cm proximal to floor at lateral plantar foot 28.3  Circumference of ankle/heel   5 cm proximal to 1st MTP joint 23.3  Across MTP joint 23.2  Around proximal great toe 8.0  (Blank rows =  not tested)  LOWER EXTREMITY LANDMARK LEFT 04/12/24 In supine today 04/15/24  At groin    30 cm proximal to suprapatella    20 cm proximal to suprapatella    10 cm proximal to suprapatella 52.2   At midpatella / popliteal crease 43.2   30 cm proximal to floor at lateral plantar foot 44.6 43.5  20 cm proximal to floor at lateral plantar foot 40.5 40.2  10 cm proximal to floor at lateral plantar foot 31.6 31.4  Circumference of ankle/heel    5 cm proximal to 1st MTP joint 23.4 23.3  Across MTP joint 22.7 23  Around proximal great toe 7.6 8.0  (Blank rows = not tested)  GAIT: Distance walked: walks with rollator walker, slow gait  Outcome measure:                                                                                                                            TREATMENT DATE:  04/15/24 Manual Therapy MLD to Lt LE: Short neck, 5  diaphragmatic breaths, Lt axillary and pectoral nodes, Lt inguino-axillary anastomosis, then focused on Lt LE working from proximal to distal then retracing all steps.  Throughout MLD answered pts questions about if lymphedema can go away - discussed stages and how stage 2 is mostly irreversible and that management is the best option.  Discussed with pt how she is unable to put most socks on and had trouble getting her velcro on from wound care and that we need to have a plan for DC. So requested pt bring these velcros to see if she can donn them.  Otherwise we may have to change the POC.  Compression Bandaging: Cocoa butter, TG soft size med, Molelast to toes 1-4, artiflex from foot to pop fossa, Short strtch compression bandages as follows: 1 - 8 cm combined technique of Roman sandal and ASH pattern, then 1 - 12 cm from ankle to pop fossa and then folded TG soft over top,   04/12/24: Self Care Educated pt about basics of anatomy of lymphatic system and principles of MLD. Also what to expect with bandaging and that we hope she can leave them on until Sunday  if they aren't sliding down leg. Then to bring everything back with her to each appt. Manual Therapy MLD to Lt LE: Short neck, 5 diaphragmatic breaths, Lt axillary and pectoral nodes, Lt inguino-axillary anastomosis, then focused on Lt LE working from proximal to distal then retracing all steps.  Compression Bandaging: Cocoa butter, TG soft size med, Molelast to toes 1-4, artiflex from foot to pop fossa, Short strtch compression bandages as follows: 1 - 8 cm combined technique of Roman sandal and ASH pattern, then 1 - 12 cm from ankle to pop fossa and then folded TG soft over top,   03/18/24: Self Care Spent time at beginning of session discussing complete decongestive therapy treatment and s/s she would need to be aware of as we begin CDT treatment. Like SOB due to cardiac issues. Also discussed how we aren't sure this is pure lymphedema as pt does have pain which isn't typical of lymphedema so if she does have another issue, like arterial or more so cardiac edema, then our treatment expectations will be adjusted accordingly. Pt verbalized understanding.  Manual Therapy Upon inspection pts Lt lower leg is very red today and initially she reports this is usual for her so began modified manual lymph drainage today working only to proximal aspect of limb but during session pt reports she is worried her leg is infected because redness has been worsening by being redder than it was (was more purple in appearance at eval) and pt reports this is worsening up her anterior leg. Stopped MLD at this point and took pictures for pts chart and inbox messaged Dr. Sw[place]il[place]. Also suggested to pt since she was right here near the doctor that she should try to just go to the office now. Pt understood and agreed. Ended session early due to this. 03/19/24 Addendum: Pt was able to see MD who prescribed antibiotics and scheduled her for an ABI.     Eval performed Discussed POC Discussed what lymphedema is and how the only way to  manage this is with compression that she finds comfortable and wearable.    PATIENT EDUCATION:  Education details: Aspects of CDT, POC, Person educated: Patient Education method: Explanation Education comprehension: verbalized understanding  HOME EXERCISE PROGRAM: Use her velcro garment from wound care center, try to sleep out of the recliner   ASSESSMENT:  CLINICAL  IMPRESSION: Pt having trouble with her stomach today.  Needing 1 bathroom break and not wanting to schedule more so she can get home for this.  She will need to schedule out more weeks.  No changes to size measurements after first light bandage.  Still no cast shoe so we did not add anything.    OBJECTIVE IMPAIRMENTS: decreased knowledge of condition, decreased knowledge of use of DME, decreased mobility, and increased edema.   ACTIVITY LIMITATIONS: locomotion level  PARTICIPATION LIMITATIONS: community activity  PERSONAL FACTORS: Time since onset of injury/illness/exacerbation and 1-2 comorbidities: CHF, CKD are also affecting patient's functional outcome.   REHAB POTENTIAL: Good  CLINICAL DECISION MAKING: Evolving/moderate complexity  EVALUATION COMPLEXITY: Moderate   GOALS: Goals reviewed with patient? Yes  SHORT TERM GOALS: Target date: 03/26/24  Pt will be educated on what lymphedema is and how it is best managed  Baseline: Goal status: INITIAL  2.  Pt will be educated on risk reduction and infection prevention including skin care Baseline:  Goal status: INITIAL   LONG TERM GOALS: Target date: 04/16/24  Pt will be measured for final compression for bil lower extremities  Baseline:  Goal status: INITIAL  2.  Pt will be educated on self MLD if able Baseline:  Goal status: INITIAL  3.  Pt will be ind with remedial exercise  Baseline:  Goal status: INITIAL   PLAN:  PT FREQUENCY: 3x/week  PT DURATION: 6 weeks  PLANNED INTERVENTIONS: 97110-Therapeutic exercises, 97530- Therapeutic activity,  97112- Neuromuscular re-education, 97140- Manual therapy, Patient/Family education, Manual lymph drainage, Compression bandaging, DME instructions, and Self Care  PLAN FOR NEXT SESSION:  If tolerated well add (6 cm to foot and second 12 cm to lower leg) bandages and foam if pt got a cast/post op shoe; watching for s/s of worsening kidney or heart issues.     Larue Saddie SAUNDERS, PT 04/15/2024, 10:56 AM    Instructions for the weekend:  Take bandages off Sunday, but sooner if they are sliding down your leg. Put everything, except toe bandages, in a bag and bring back to each appt.   2. [employer]: look for long handled loufa or back scrubber brush and a plastic container that you can soak your feet in and scrub with the brush  3. Medical Supply store: Buy a post op or cast shoe. It has a tread on the bottom with adjustable straps on top   [facility] [phone]

## 2024-04-18 ENCOUNTER — Encounter: Payer: Self-pay | Admitting: Rehabilitation

## 2024-04-18 ENCOUNTER — Ambulatory Visit: Attending: Family Medicine | Admitting: Rehabilitation

## 2024-04-18 DIAGNOSIS — I890 Lymphedema, not elsewhere classified: Secondary | ICD-10-CM | POA: Insufficient documentation

## 2024-04-18 NOTE — Therapy (Signed)
 OUTPATIENT PHYSICAL THERAPY  LOWER EXTREMITY ONCOLOGY TREATMENT  Patient Name: Tracy [name] MRN: [id number] DOB:03-25-1946, 78 y.o., female Today's Date: 04/18/2024  END OF SESSION:  PT End of Session - 04/18/24 1048     Visit Number 5    Number of Visits 19    Date for PT Re-Evaluation 05/20/24    PT Start Time 1000    PT Stop Time 1036    PT Time Calculation (min) 36 min    Activity Tolerance Patient tolerated treatment well    Behavior During Therapy [facility] for tasks assessed/performed           Past Medical History:  Diagnosis Date   Allergic rhinitis    Anxiety    Arthritis    Ascending aorta dilation (HCC) 09/13/2022   Echo 09/12/2022: EF 50-55, GR 1 DD, mildly reduced RVSF, normal PASP (RVSP 17.7), mild dilation of ascending aorta (38 mm), RAP 3   Breast cancer (HCC) 04/28/2020   rigtht breast   Bronchitis    CAD (coronary artery disease)    1/19 PCI/DES to pLCX for ISR, normal EF.    Cataract    Cervical dysplasia    unsure of procedure, possible burning in her late 54s   CHF (congestive heart failure) (HCC)    Echo 06/2019: EF 55-60, elevated LVEDP, normal RV SF, mild MAC, mild MR, trivial TR   Complication of anesthesia    COPD (chronic obstructive pulmonary disease) (HCC)    early    Depression    GERD (gastroesophageal reflux disease)    Gout    Hyperlipidemia    Hypertension    Low back pain    Overactive bladder    PONV (postoperative nausea and vomiting)    Sleep apnea    Past Surgical History:  Procedure Laterality Date   ANGIOPLASTY     stent 2007   BREAST EXCISIONAL BIOPSY Right 04/2020   ADH/LCIS   BREAST LUMPECTOMY WITH RADIOACTIVE SEED LOCALIZATION Right 04/28/2020   Procedure: RIGHT BREAST LUMPECTOMY X 2  WITH RADIOACTIVE SEED LOCALIZATION;  Surgeon: Belinda Cough, MD;  Location: [facility];  Service: General;  Laterality: Right;  LMA   CARDIAC CATHETERIZATION     COLONOSCOPY     CORONARY STENT INTERVENTION N/A  11/06/2017   Procedure: CORONARY STENT INTERVENTION;  Surgeon: Mady Bruckner, MD;  Location: [facility];  Service: Cardiovascular;  Laterality: N/A;   LEFT HEART CATH AND CORONARY ANGIOGRAPHY N/A 11/06/2017   Procedure: LEFT HEART CATH AND CORONARY ANGIOGRAPHY;  Surgeon: Mady Bruckner, MD;  Location: [facility];  Service: Cardiovascular;  Laterality: N/A;   LEFT HEART CATH AND CORONARY ANGIOGRAPHY N/A 07/24/2019   Procedure: LEFT HEART CATH AND CORONARY ANGIOGRAPHY;  Surgeon: Verlin Bruckner BIRCH, MD;  Location: [facility];  Service: Cardiovascular;  Laterality: N/A;   RIGHT/LEFT HEART CATH AND CORONARY ANGIOGRAPHY N/A 09/29/2020   Procedure: RIGHT/LEFT HEART CATH AND CORONARY ANGIOGRAPHY;  Surgeon: Claudene Victory ORN, MD;  Location: [facility];  Service: Cardiovascular;  Laterality: N/A;   ROBOTIC ASSISTED BILATERAL SALPINGO OOPHERECTOMY Bilateral 11/10/2020   Procedure: XI ROBOTIC ASSISTED BILATERAL SALPINGO OOPHORECTOMY WITH MINI LAPAROTOMY FOR DRAINAGE;  Surgeon: Viktoria Comer SAUNDERS, MD;  Location: [facility];  Service: Gynecology;  Laterality: Bilateral;  MINI LAP FIRST   TONSILLECTOMY     VULVECTOMY N/A 11/10/2020   Procedure: WIDE EXCISION VULVECTOMY;  Surgeon: Viktoria Comer SAUNDERS, MD;  Location: [facility];  Service: Gynecology;  Laterality: N/A;  Patient Active Problem List   Diagnosis Date Noted   Weakness 10/05/2023   Bronchopneumonia 10/05/2023   Multifocal pneumonia 09/29/2023   Hypokalemia 08/07/2023   Intertrigo 04/18/2023   Venous stasis dermatitis of both lower extremities 11/28/2022   Ascending aorta dilation (HCC) 09/13/2022   Dizziness 01/19/2022   Frequent falls 01/19/2022   Tremor of both hands 08/16/2021   Aortic atherosclerosis (HCC) 06/15/2021   Generalized osteoarthritis of multiple sites 12/25/2020   Chronic pain disorder 12/25/2020   Pelvic mass in female    Bilateral lower extremity edema 04/13/2020   Shortness of breath     Anxiety disorder 10/29/2018   CKD (chronic kidney disease), stage III (HCC) 06/27/2018   COPD (chronic obstructive pulmonary disease) (HCC) 05/25/2018   Obesity 03/20/2018   Dyspnea on exertion 11/06/2017   Abnormal stress test 11/06/2017   Chronic heart failure with preserved ejection fraction (HCC) 09/04/2017   Left ventricular dysfunction 07/31/2017   Cataract, nuclear sclerotic senile, bilateral 02/01/2017   Gouty arthritis of toe of left foot 08/09/2016   Hyperuricemia 08/09/2016   Osteoarthritis (arthritis due to wear and tear of joints) 01/14/2014   Atherosclerosis of native coronary artery of native heart with angina pectoris (HCC) 08/02/2011   Mitral regurgitation 08/02/2011   OVERACTIVE BLADDER 07/02/2010   ACUTE CYSTITIS 05/25/2010   Mixed hyperlipidemia 08/11/2009   Leg edema, left 07/22/2009   GERD 04/11/2008   Constipation 04/11/2008   Essential hypertension 05/30/2007   MYOCARDIAL INFARCTION, HX OF 05/30/2007   Allergic rhinitis 05/30/2007   LOW BACK PAIN 05/30/2007     REFERRING PROVIDER: Betty Swaziland, MD  REFERRING DIAG: I89.0  THERAPY DIAG:  Lymphedema, not elsewhere classified  ONSET DATE: unknown  Rationale for Evaluation and Treatment: Rehabilitation  SUBJECTIVE:                                                                                                                                                                                           SUBJECTIVE STATEMENT:  The bandage was fine I took it off yesterday around 7pm.  I brought my velcros.     PERTINENT HISTORY: HTN, CAD, COPD, OA, CKD stage 3, Controlled CHF but does have some soft pitting edema bilaterally. 11/10/20: Robotic BSO and removal of left labial mass and fallopian tube 18cm cystic mass, pathology showed serous cystadenoma and benign hidradenoma papilliferum. Size difference noted even back in 2020 with negative doppler for DVT. Recurrent cellultis in the leg.   PAIN:  Are you having  pain? Yes NPRS scale: up to 5/10 Pain location: upper calf  Pain orientation: Left  PAIN TYPE: tender to touch Pain description:  constant  Aggravating factors: pushing on it, sitting still too long  Relieving factors: when I compress it like an unna boot   PRECAUTIONS: possible active infection left leg, Fall risk  RED FLAGS: None   WEIGHT BEARING RESTRICTIONS: No  FALLS:  Has patient fallen in last 6 months? Yes. Number of falls 1  LIVING ENVIRONMENT: Lives with: lives with their family and lives with their spouse Lives in: House/apartment Has following equipment at home: Walker - 4 wheeled and SPC  I don't climb the steps anymore    OCCUPATION: reitred   LEISURE: not addressed   PRIOR LEVEL OF FUNCTION: Independent with basic ADLs  PATIENT GOALS: decrease leg pain and infections   OBJECTIVE: Note: Objective measures were completed at Evaluation unless otherwise noted.  COGNITION: Overall cognitive status: Within functional limits for tasks assessed   PALPATION: +1-2 pitting bil Lower extremities from foot to knee  OBSERVATIONS / OTHER ASSESSMENTS: Lt LE is larger than Rt up to groin.  Rt leg has edema evident mostly around the ankle with indentions from her shoe and mild redness.  Lt leg is a brighter red color.  Also some scaly hyperkeratosis on the front of the left leg and some lymphorrhea bumps on the back of the leg.       LYMPHEDEMA ASSESSMENTS:   LOWER EXTREMITY LANDMARK RIGHT 04/15/24  At groin   30 cm proximal to suprapatella   20 cm proximal to suprapatella   10 cm proximal to suprapatella   At midpatella / popliteal crease   30 cm proximal to floor at lateral plantar foot 39  20 cm proximal to floor at lateral plantar foot 29.5  10 cm proximal to floor at lateral plantar foot 28.3  Circumference of ankle/heel   5 cm proximal to 1st MTP joint 23.3  Across MTP joint 23.2  Around proximal great toe 8.0  (Blank rows = not tested)  LOWER  EXTREMITY LANDMARK LEFT 04/12/24 In supine today 04/15/24 04/18/24  At groin     30 cm proximal to suprapatella     20 cm proximal to suprapatella     10 cm proximal to suprapatella 52.2    At midpatella / popliteal crease 43.2    30 cm proximal to floor at lateral plantar foot 44.6 43.5 40.2  20 cm proximal to floor at lateral plantar foot 40.5 40.2 37.9  10 cm proximal to floor at lateral plantar foot 31.6 31.4 30.5  Circumference of ankle/heel     5 cm proximal to 1st MTP joint 23.4 23.3   Across MTP joint 22.7 23   Around proximal great toe 7.6 8.0   (Blank rows = not tested)  GAIT: Distance walked: walks with rollator walker, slow gait  Outcome measure:                                                                                                                            TREATMENT DATE:  04/18/24 Pt brought her circaid juxtafit garment sets x 2 Showed pt all donning assist devices and pt tried leg butler but was unable to pull the correct direction.   She was able to put on a tg soft as a skin liner and we also talked about putting it over a pair of pants if needed. Then applied circaid x 2 - pt needing mainly cueing on which way to put the garment so it was not upside down.  Pt is able to reach all the way to the ankle with a stool.   Then took a photo of how it should look.   Pt will try this over the weekend and then she may be ready for DC Gave pt 3 liners  04/15/24 Manual Therapy MLD to Lt LE: Short neck, 5 diaphragmatic breaths, Lt axillary and pectoral nodes, Lt inguino-axillary anastomosis, then focused on Lt LE working from proximal to distal then retracing all steps.  Throughout MLD answered pts questions about if lymphedema can go away - discussed stages and how stage 2 is mostly irreversible and that management is the best option.  Discussed with pt how she is unable to put most socks on and had trouble getting her velcro on from wound care and that we need to have a  plan for DC. So requested pt bring these velcros to see if she can donn them.  Otherwise we may have to change the POC.  Compression Bandaging: Cocoa butter, TG soft size med, Molelast to toes 1-4, artiflex from foot to pop fossa, Short strtch compression bandages as follows: 1 - 8 cm combined technique of Roman sandal and ASH pattern, then 1 - 12 cm from ankle to pop fossa and then folded TG soft over top,   04/12/24: Self Care Educated pt about basics of anatomy of lymphatic system and principles of MLD. Also what to expect with bandaging and that we hope she can leave them on until Sunday if they aren't sliding down leg. Then to bring everything back with her to each appt. Manual Therapy MLD to Lt LE: Short neck, 5 diaphragmatic breaths, Lt axillary and pectoral nodes, Lt inguino-axillary anastomosis, then focused on Lt LE working from proximal to distal then retracing all steps.  Compression Bandaging: Cocoa butter, TG soft size med, Molelast to toes 1-4, artiflex from foot to pop fossa, Short strtch compression bandages as follows: 1 - 8 cm combined technique of Roman sandal and ASH pattern, then 1 - 12 cm from ankle to pop fossa and then folded TG soft over top,   PATIENT EDUCATION:  Education details: Aspects of CDT, POC, Person educated: Patient Education method: Explanation Education comprehension: verbalized understanding  HOME EXERCISE PROGRAM: Use her velcro garment from wound care center, try to sleep out of the recliner   ASSESSMENT:  CLINICAL IMPRESSION: Pt has had some good reduction from first day and would like to try her velcro garments as she was able to put them on without assistance.  Before she said she didn't know how to use them.  Most likely 1 more visit.   OBJECTIVE IMPAIRMENTS: decreased knowledge of condition, decreased knowledge of use of DME, decreased mobility, and increased edema.   ACTIVITY LIMITATIONS: locomotion level  PARTICIPATION LIMITATIONS:  community activity  PERSONAL FACTORS: Time since onset of injury/illness/exacerbation and 1-2 comorbidities: CHF, CKD are also affecting patient's functional outcome.   REHAB POTENTIAL: Good  CLINICAL DECISION MAKING: Evolving/moderate complexity  EVALUATION COMPLEXITY: Moderate   GOALS: Goals reviewed  with patient? Yes  SHORT TERM GOALS: Target date: 03/26/24  Pt will be educated on what lymphedema is and how it is best managed  Baseline: Goal status: MET  2.  Pt will be educated on risk reduction and infection prevention including skin care Baseline:  Goal status: MET   LONG TERM GOALS: Target date: 04/16/24  Pt will be measured for final compression for bil lower extremities  Baseline:  Goal status: INITIAL  2.  Pt will be educated on self MLD if able Baseline:  Goal status: INITIAL  3.  Pt will be ind with remedial exercise  Baseline:  Goal status: INITIAL   PLAN:  PT FREQUENCY: 3x/week  PT DURATION: 6 weeks  PLANNED INTERVENTIONS: 97110-Therapeutic exercises, 97530- Therapeutic activity, 97112- Neuromuscular re-education, 97140- Manual therapy, Patient/Family education, Manual lymph drainage, Compression bandaging, DME instructions, and Self Care  PLAN FOR NEXT SESSION:  If tolerated well add (6 cm to foot and second 12 cm to lower leg) bandages and foam if pt got a cast/post op shoe; watching for s/s of worsening kidney or heart issues.     Larue Saddie SAUNDERS, PT 04/18/2024, 10:48 AM    Instructions for the weekend:  Take bandages off Sunday, but sooner if they are sliding down your leg. Put everything, except toe bandages, in a bag and bring back to each appt.   2. [employer]: look for long handled loufa or back scrubber brush and a plastic container that you can soak your feet in and scrub with the brush  3. Medical Supply store: Buy a post op or cast shoe. It has a tread on the bottom with adjustable straps on top   [facility] [phone]

## 2024-04-22 ENCOUNTER — Other Ambulatory Visit: Payer: Self-pay | Admitting: Internal Medicine

## 2024-04-22 ENCOUNTER — Ambulatory Visit: Admitting: Rehabilitation

## 2024-04-22 DIAGNOSIS — M25552 Pain in left hip: Secondary | ICD-10-CM

## 2024-04-22 DIAGNOSIS — I890 Lymphedema, not elsewhere classified: Secondary | ICD-10-CM

## 2024-04-22 NOTE — Therapy (Signed)
 OUTPATIENT PHYSICAL THERAPY  LOWER EXTREMITY ONCOLOGY TREATMENT  Patient Name: Tracy [name] MRN: [id number] DOB:June 25, 1946, 78 y.o., female Today's Date: 04/22/2024  END OF SESSION:  PT End of Session - 04/22/24 1456     Visit Number 6    Date for PT Re-Evaluation 05/20/24    PT Start Time 0935    PT Stop Time 1009    PT Time Calculation (min) 34 min    Activity Tolerance Patient tolerated treatment well    Behavior During Therapy [facility] for tasks assessed/performed            Past Medical History:  Diagnosis Date   Allergic rhinitis    Anxiety    Arthritis    Ascending aorta dilation (HCC) 09/13/2022   Echo 09/12/2022: EF 50-55, GR 1 DD, mildly reduced RVSF, normal PASP (RVSP 17.7), mild dilation of ascending aorta (38 mm), RAP 3   Breast cancer (HCC) 04/28/2020   rigtht breast   Bronchitis    CAD (coronary artery disease)    1/19 PCI/DES to pLCX for ISR, normal EF.    Cataract    Cervical dysplasia    unsure of procedure, possible burning in her late 38s   CHF (congestive heart failure) (HCC)    Echo 06/2019: EF 55-60, elevated LVEDP, normal RV SF, mild MAC, mild MR, trivial TR   Complication of anesthesia    COPD (chronic obstructive pulmonary disease) (HCC)    early    Depression    GERD (gastroesophageal reflux disease)    Gout    Hyperlipidemia    Hypertension    Low back pain    Overactive bladder    PONV (postoperative nausea and vomiting)    Sleep apnea    Past Surgical History:  Procedure Laterality Date   ANGIOPLASTY     stent 2007   BREAST EXCISIONAL BIOPSY Right 04/2020   ADH/LCIS   BREAST LUMPECTOMY WITH RADIOACTIVE SEED LOCALIZATION Right 04/28/2020   Procedure: RIGHT BREAST LUMPECTOMY X 2  WITH RADIOACTIVE SEED LOCALIZATION;  Surgeon: Belinda Cough, MD;  Location:  S[facility]ERY C[facility];  Service: General;  Laterality: Right;  LMA   CARDIAC CATHETERIZATION     COLONOSCOPY     CORONARY STENT INTERVENTION N/A 11/06/2017    Procedure: CORONARY STENT INTERVENTION;  Surgeon: Mady Bruckner, MD;  Location: [facility];  Service: Cardiovascular;  Laterality: N/A;   LEFT HEART CATH AND CORONARY ANGIOGRAPHY N/A 11/06/2017   Procedure: LEFT HEART CATH AND CORONARY ANGIOGRAPHY;  Surgeon: Mady Bruckner, MD;  Location: [facility];  Service: Cardiovascular;  Laterality: N/A;   LEFT HEART CATH AND CORONARY ANGIOGRAPHY N/A 07/24/2019   Procedure: LEFT HEART CATH AND CORONARY ANGIOGRAPHY;  Surgeon: Verlin Bruckner BIRCH, MD;  Location: [facility];  Service: Cardiovascular;  Laterality: N/A;   RIGHT/LEFT HEART CATH AND CORONARY ANGIOGRAPHY N/A 09/29/2020   Procedure: RIGHT/LEFT HEART CATH AND CORONARY ANGIOGRAPHY;  Surgeon: Claudene Victory ORN, MD;  Location: [facility];  Service: Cardiovascular;  Laterality: N/A;   ROBOTIC ASSISTED BILATERAL SALPINGO OOPHERECTOMY Bilateral 11/10/2020   Procedure: XI ROBOTIC ASSISTED BILATERAL SALPINGO OOPHORECTOMY WITH MINI LAPAROTOMY FOR DRAINAGE;  Surgeon: Viktoria Comer SAUNDERS, MD;  Location: [facility];  Service: Gynecology;  Laterality: Bilateral;  MINI LAP FIRST   TONSILLECTOMY     VULVECTOMY N/A 11/10/2020   Procedure: WIDE EXCISION VULVECTOMY;  Surgeon: Viktoria Comer SAUNDERS, MD;  Location: [facility];  Service: Gynecology;  Laterality: N/A;   Patient Active Problem List  Diagnosis Date Noted   Weakness 10/05/2023   Bronchopneumonia 10/05/2023   Multifocal pneumonia 09/29/2023   Hypokalemia 08/07/2023   Intertrigo 04/18/2023   Venous stasis dermatitis of both lower extremities 11/28/2022   Ascending aorta dilation (HCC) 09/13/2022   Dizziness 01/19/2022   Frequent falls 01/19/2022   Tremor of both hands 08/16/2021   Aortic atherosclerosis (HCC) 06/15/2021   Generalized osteoarthritis of multiple sites 12/25/2020   Chronic pain disorder 12/25/2020   Pelvic mass in female    Bilateral lower extremity edema 04/13/2020   Shortness of breath    Anxiety disorder  10/29/2018   CKD (chronic kidney disease), stage III (HCC) 06/27/2018   COPD (chronic obstructive pulmonary disease) (HCC) 05/25/2018   Obesity 03/20/2018   Dyspnea on exertion 11/06/2017   Abnormal stress test 11/06/2017   Chronic heart failure with preserved ejection fraction (HCC) 09/04/2017   Left ventricular dysfunction 07/31/2017   Cataract, nuclear sclerotic senile, bilateral 02/01/2017   Gouty arthritis of toe of left foot 08/09/2016   Hyperuricemia 08/09/2016   Osteoarthritis (arthritis due to wear and tear of joints) 01/14/2014   Atherosclerosis of native coronary artery of native heart with angina pectoris (HCC) 08/02/2011   Mitral regurgitation 08/02/2011   OVERACTIVE BLADDER 07/02/2010   ACUTE CYSTITIS 05/25/2010   Mixed hyperlipidemia 08/11/2009   Leg edema, left 07/22/2009   GERD 04/11/2008   Constipation 04/11/2008   Essential hypertension 05/30/2007   MYOCARDIAL INFARCTION, HX OF 05/30/2007   Allergic rhinitis 05/30/2007   LOW BACK PAIN 05/30/2007     REFERRING PROVIDER: Betty Swaziland, MD  REFERRING DIAG: I89.0  THERAPY DIAG:  Lymphedema, not elsewhere classified  ONSET DATE: unknown  Rationale for Evaluation and Treatment: Rehabilitation  SUBJECTIVE:                                                                                                                                                                                           SUBJECTIVE STATEMENT:  I was able to take my velcro off and put it on.  I don't have it on now.    PERTINENT HISTORY: HTN, CAD, COPD, OA, CKD stage 3, Controlled CHF but does have some soft pitting edema bilaterally. 11/10/20: Robotic BSO and removal of left labial mass and fallopian tube 18cm cystic mass, pathology showed serous cystadenoma and benign hidradenoma papilliferum. Size difference noted even back in 2020 with negative doppler for DVT. Recurrent cellultis in the leg.   PAIN:  Are you having pain? Yes NPRS scale:  up to 5/10 Pain location: upper calf  Pain orientation: Left  PAIN TYPE: tender to touch Pain description: constant  Aggravating factors:  pushing on it, sitting still too long  Relieving factors: when I compress it like an unna boot   PRECAUTIONS: possible active infection left leg, Fall risk  RED FLAGS: None   WEIGHT BEARING RESTRICTIONS: No  FALLS:  Has patient fallen in last 6 months? Yes. Number of falls 1  LIVING ENVIRONMENT: Lives with: lives with their family and lives with their spouse Lives in: House/apartment Has following equipment at home: Walker - 4 wheeled and SPC  I don't climb the steps anymore    OCCUPATION: reitred   LEISURE: not addressed   PRIOR LEVEL OF FUNCTION: Independent with basic ADLs  PATIENT GOALS: decrease leg pain and infections   OBJECTIVE: Note: Objective measures were completed at Evaluation unless otherwise noted.  COGNITION: Overall cognitive status: Within functional limits for tasks assessed   PALPATION: +1-2 pitting bil Lower extremities from foot to knee  OBSERVATIONS / OTHER ASSESSMENTS: Lt LE is larger than Rt up to groin.  Rt leg has edema evident mostly around the ankle with indentions from her shoe and mild redness.  Lt leg is a brighter red color.  Also some scaly hyperkeratosis on the front of the left leg and some lymphorrhea bumps on the back of the leg.       EVAL    D/C   LYMPHEDEMA ASSESSMENTS:   LOWER EXTREMITY LANDMARK RIGHT 04/15/24  At groin   30 cm proximal to suprapatella   20 cm proximal to suprapatella   10 cm proximal to suprapatella   At midpatella / popliteal crease   30 cm proximal to floor at lateral plantar foot 39  20 cm proximal to floor at lateral plantar foot 29.5  10 cm proximal to floor at lateral plantar foot 28.3  Circumference of ankle/heel   5 cm proximal to 1st MTP joint 23.3  Across MTP joint 23.2  Around proximal great toe 8.0  (Blank rows = not tested)  LOWER EXTREMITY  LANDMARK LEFT 04/12/24 In supine today 04/15/24 04/18/24 04/22/24  At groin      30 cm proximal to suprapatella      20 cm proximal to suprapatella      10 cm proximal to suprapatella 52.2     At midpatella / popliteal crease 43.2     30 cm proximal to floor at lateral plantar foot 44.6 43.5 40.2 40.4  20 cm proximal to floor at lateral plantar foot 40.5 40.2 37.9 38.1  10 cm proximal to floor at lateral plantar foot 31.6 31.4 30.5 31.2  Circumference of ankle/heel      5 cm proximal to 1st MTP joint 23.4 23.3    Across MTP joint 22.7 23    Around proximal great toe 7.6 8.0    (Blank rows = not tested)  GAIT: Distance walked: walks with rollator walker, slow gait  Outcome measure:  TREATMENT DATE:  04/22/24 Remeasured with no significant increases over the past 4 days Pt donned her velcro x 2 with use of stool with only very minor cueing needed to keep the velcro in the right place Discussed wash and life of garments Gave handout for tg soft if more is needed and [facility] info.   04/18/24 Pt brought her circaid juxtafit garment sets x 2 Showed pt all donning assist devices and pt tried leg butler but was unable to pull the correct direction.   She was able to put on a tg soft as a skin liner and we also talked about putting it over a pair of pants if needed. Then applied circaid x 2 - pt needing mainly cueing on which way to put the garment so it was not upside down.  Pt is able to reach all the way to the ankle with a stool.   Then took a photo of how it should look.   Pt will try this over the weekend and then she may be ready for DC Gave pt 3 liners  04/15/24 Manual Therapy MLD to Lt LE: Short neck, 5 diaphragmatic breaths, Lt axillary and pectoral nodes, Lt inguino-axillary anastomosis, then focused on Lt LE working from proximal to distal then  retracing all steps.  Throughout MLD answered pts questions about if lymphedema can go away - discussed stages and how stage 2 is mostly irreversible and that management is the best option.  Discussed with pt how she is unable to put most socks on and had trouble getting her velcro on from wound care and that we need to have a plan for DC. So requested pt bring these velcros to see if she can donn them.  Otherwise we may have to change the POC.  Compression Bandaging: Cocoa butter, TG soft size med, Molelast to toes 1-4, artiflex from foot to pop fossa, Short strtch compression bandages as follows: 1 - 8 cm combined technique of Roman sandal and ASH pattern, then 1 - 12 cm from ankle to pop fossa and then folded TG soft over top,   04/12/24: Self Care Educated pt about basics of anatomy of lymphatic system and principles of MLD. Also what to expect with bandaging and that we hope she can leave them on until Sunday if they aren't sliding down leg. Then to bring everything back with her to each appt. Manual Therapy MLD to Lt LE: Short neck, 5 diaphragmatic breaths, Lt axillary and pectoral nodes, Lt inguino-axillary anastomosis, then focused on Lt LE working from proximal to distal then retracing all steps.  Compression Bandaging: Cocoa butter, TG soft size med, Molelast to toes 1-4, artiflex from foot to pop fossa, Short strtch compression bandages as follows: 1 - 8 cm combined technique of Roman sandal and ASH pattern, then 1 - 12 cm from ankle to pop fossa and then folded TG soft over top,   PATIENT EDUCATION:  Education details: Aspects of CDT, POC, Person educated: Patient Education method: Explanation Education comprehension: verbalized understanding  HOME EXERCISE PROGRAM: Use her velcro garment from wound care center, try to sleep out of the recliner   ASSESSMENT:  CLINICAL IMPRESSION: Pt will attempt D/C today with velcro self care.    OBJECTIVE IMPAIRMENTS: decreased knowledge of  condition, decreased knowledge of use of DME, decreased mobility, and increased edema.   ACTIVITY LIMITATIONS: locomotion level  PARTICIPATION LIMITATIONS: community activity  PERSONAL FACTORS: Time since onset of injury/illness/exacerbation and 1-2 comorbidities:  CHF, CKD are also affecting patient's functional outcome.   REHAB POTENTIAL: Good  CLINICAL DECISION MAKING: Evolving/moderate complexity  EVALUATION COMPLEXITY: Moderate   GOALS: Goals reviewed with patient? Yes  SHORT TERM GOALS: Target date: 03/26/24  Pt will be educated on what lymphedema is and how it is best managed  Baseline: Goal status: MET  2.  Pt will be educated on risk reduction and infection prevention including skin care Baseline:  Goal status: MET   LONG TERM GOALS: Target date: 04/16/24  Pt will be measured for final compression for bil lower extremities  Baseline:  Goal status: MET  2.  Pt will be educated on self MLD if able Baseline:  Goal status: NOT PERFORMED DUE TO MOBILITY   3.  Pt will be ind with remedial exercise  Baseline:  Goal status: MET   PLAN:  PT FREQUENCY: 3x/week  PT DURATION: 6 weeks  PLANNED INTERVENTIONS: 97110-Therapeutic exercises, 97530- Therapeutic activity, 97112- Neuromuscular re-education, 97140- Manual therapy, Patient/Family education, Manual lymph drainage, Compression bandaging, DME instructions, and Self Care  PLAN FOR NEXT SESSION:  If tolerated well add (6 cm to foot and second 12 cm to lower leg) bandages and foam if pt got a cast/post op shoe; watching for s/s of worsening kidney or heart issues.     Larue Saddie SAUNDERS, PT 04/22/2024, 2:57 PM    Instructions for the weekend:  Take bandages off Sunday, but sooner if they are sliding down your leg. Put everything, except toe bandages, in a bag and bring back to each appt.   2. [employer]: look for long handled loufa or back scrubber brush and a plastic container that you can soak your feet in and  scrub with the brush  3. Medical Supply store: Buy a post op or cast shoe. It has a tread on the bottom with adjustable straps on top   [facility] [phone]  PHYSICAL THERAPY DISCHARGE SUMMARY  Visits from Start of Care: 6  Current functional level related to goals / functional outcomes: See above   Remaining deficits: Chronic lymphedema    Education / Equipment: Home compression, exercise, skin care  Plan: Patient agrees to discharge.  Patient is being discharged due to meeting the stated rehab goals.

## 2024-04-23 ENCOUNTER — Ambulatory Visit (INDEPENDENT_AMBULATORY_CARE_PROVIDER_SITE_OTHER): Admitting: Podiatry

## 2024-04-23 ENCOUNTER — Encounter: Payer: Self-pay | Admitting: Podiatry

## 2024-04-23 DIAGNOSIS — B351 Tinea unguium: Secondary | ICD-10-CM | POA: Diagnosis not present

## 2024-04-23 DIAGNOSIS — M79674 Pain in right toe(s): Secondary | ICD-10-CM

## 2024-04-23 DIAGNOSIS — I872 Venous insufficiency (chronic) (peripheral): Secondary | ICD-10-CM

## 2024-04-23 DIAGNOSIS — M79675 Pain in left toe(s): Secondary | ICD-10-CM

## 2024-04-23 DIAGNOSIS — R600 Localized edema: Secondary | ICD-10-CM

## 2024-04-23 DIAGNOSIS — N183 Chronic kidney disease, stage 3 unspecified: Secondary | ICD-10-CM

## 2024-04-24 ENCOUNTER — Encounter: Payer: Self-pay | Admitting: Podiatry

## 2024-04-24 ENCOUNTER — Ambulatory Visit

## 2024-04-24 NOTE — Progress Notes (Signed)
 This patient presents to the office with chief complaint of long thick nails especially her big toenail right foot.  She says the nails are painful walking and wearing her shoes.  She is unable to treat her nails herself.  She has history of venous stasis, CKD and lymphedema.She presents to the office with her husband.  Vascular  Dorsalis pedis and posterior tibial pulses are  not palpable due to lymphedema  B/L.  Capillary return  WNL.  Temperature gradient is  WNL.  Skin turgor  WNL  Sensorium  Senn Weinstein monofilament wire  WNL. Normal tactile sensation.  Nail Exam  Patient has thick mycotic nails with no evidence of bacterial infection.  Orthopedic  Exam  Muscle tone and muscle strength  WNL.  No limitations of motion feet  B/L.  No crepitus or joint effusion noted.  Foot type is unremarkable and digits show no abnormalities. HAV  B/L  Skin  No open lesions.  Normal skin texture and turgor.   Onychomycosis  B/L  IE.  Debride nails with nail nipper followed by dremel tool usage.   Cordella Bold DPM

## 2024-04-26 ENCOUNTER — Encounter

## 2024-04-26 ENCOUNTER — Ambulatory Visit: Payer: Self-pay | Admitting: Family Medicine

## 2024-04-29 ENCOUNTER — Ambulatory Visit (INDEPENDENT_AMBULATORY_CARE_PROVIDER_SITE_OTHER): Admitting: Family Medicine

## 2024-04-29 ENCOUNTER — Encounter: Payer: Self-pay | Admitting: Rehabilitation

## 2024-04-29 ENCOUNTER — Encounter: Payer: Self-pay | Admitting: Family Medicine

## 2024-04-29 ENCOUNTER — Ambulatory Visit: Admitting: Rehabilitation

## 2024-04-29 VITALS — BP 120/70 | HR 94 | Resp 16 | Ht 64.0 in | Wt 216.5 lb

## 2024-04-29 DIAGNOSIS — N1832 Chronic kidney disease, stage 3b: Secondary | ICD-10-CM | POA: Diagnosis not present

## 2024-04-29 DIAGNOSIS — I25119 Atherosclerotic heart disease of native coronary artery with unspecified angina pectoris: Secondary | ICD-10-CM

## 2024-04-29 DIAGNOSIS — I739 Peripheral vascular disease, unspecified: Secondary | ICD-10-CM | POA: Diagnosis not present

## 2024-04-29 DIAGNOSIS — E782 Mixed hyperlipidemia: Secondary | ICD-10-CM

## 2024-04-29 DIAGNOSIS — R197 Diarrhea, unspecified: Secondary | ICD-10-CM

## 2024-04-29 MED ORDER — NITROGLYCERIN 0.4 MG SL SUBL: SUBLINGUAL_TABLET | SUBLINGUAL | 6 refills | Status: AC

## 2024-04-29 MED ORDER — ROSUVASTATIN CALCIUM 40 MG PO TABS: 40.0000 mg | ORAL_TABLET | Freq: Every day | ORAL | 1 refills | Status: DC

## 2024-04-29 NOTE — Progress Notes (Unsigned)
 ACUTE VISIT Chief Complaint  Patient presents with   Diarrhea   Results   Discussed the use of AI scribe software for clinical note transcription with the patient, who gave verbal consent to proceed.  History of Present Illness Tracy [name] is a 78 year old female with a history of HTN,CKD III,CAD, HFpEF,GERD, COPD,and chronic pain  who presents with persistent diarrhea.  She has been experiencing intermittent episodes of diarrhea since April 2025, which worsened during a visit to her grandsons in [place]. The diarrhea occurs immediately after eating any type of food, leading to accidents and necessitating carrying extra clothing. She has been hesitant to eat due to this issue.   The diarrhea has been on and off since her abdominal surgery in January 2022, hysterectomy.  She has a history of constipation since childhood, which has been exacerbated since the surgery, alternating with diarrhea.  She has been using Pepto Bismol, resulting in black stools, and has tried Imodium without success. She occasionally uses chewable stool softeners for constipation. States that she feels drained after episodes of diarrhea. No associated abdominal pain, she has some residual after abdominal surgery. She has not noted blood in stool.  Her past medical history includes a partial hysterectomy in January 2022 due to a pelvic mass, and she has had a colonoscopy in December 2022 which showed internal hemorrhoids but no major findings.   She has been on antibiotics on and off for possible LE cellulitis. No overseas travel or consumption of suspicious food.  She has not noticed any specific food patterns that trigger the diarrhea and has not observed any difference when eating smaller portions or different types of food.  CKD III: She did not re-schedule appt with nephrologist, which she missed due to hospitalization. Negative for gross hematuria, foam in urine, or decreased urine  output.  Lab Results  Component Value Date   NA 138 11/13/2023   CL 105 11/13/2023   K 4.1 11/13/2023   CO2 27 11/13/2023   BUN 52 (H) 11/13/2023   CREATININE 1.51 (H) 11/13/2023   GFR 33.59 (L) 11/13/2023   CALCIUM  9.2 11/13/2023   ALBUMIN 3.0 (L) 10/11/2022   GLUCOSE 98 11/13/2023   Lab Results  Component Value Date   WBC 9.8 11/13/2023   HGB 12.0 11/13/2023   HCT 37.4 11/13/2023   MCV 83.4 11/13/2023   PLT 270.0 11/13/2023   -Venous stasis dermatitis and lymphedema have improved some. She is wearing a compression bandage around LLE. Taking Furosemide  20 mg daily as needed.  ABI done on 04/09/24 showed mild PAD.  Right: Resting right ankle-brachial index indicates mild right lower extremity arterial disease. The right toe-brachial index is normal.  Left: Resting left ankle-brachial index is within normal range. The left toe-brachial index is abnormal.   No claudication like symptoms. CAD, she follows with cardiologist annually. She needs refills on nitroglycerine 0.4 mg, which she has not needed in a while. She is on Pravastatin  40 mg daily and Zetia  10 mg daily. Atorvastatin  aggravated arthralgias. Lab Results  Component Value Date   CHOL 193 08/31/2022   HDL 42 08/31/2022   LDLCALC 113 (H) 08/31/2022   LDLDIRECT 111.0 01/05/2015   TRIG 218 (H) 08/31/2022   CHOLHDL 4.6 (H) 08/31/2022   Review of Systems  Constitutional:  Negative for activity change, appetite change and fever.  HENT:  Negative for sore throat and trouble swallowing.   Respiratory:  Negative for cough, shortness of breath and wheezing.  Cardiovascular:  Positive for leg swelling. Negative for chest pain and palpitations.  Gastrointestinal:  Negative for nausea and vomiting.  Endocrine: Negative for cold intolerance and heat intolerance.  Genitourinary:  Negative for dysuria.  Musculoskeletal:  Positive for arthralgias and gait problem.  Skin:  Negative for rash.  Neurological:  Negative for  syncope, weakness and headaches.  See other pertinent positives and negatives in HPI.  Current Outpatient Medications on File Prior to Visit  Medication Sig Dispense Refill   acetaminophen  (TYLENOL ) 650 MG CR tablet Take 1,300 mg by mouth 2 (two) times daily as needed for pain.     allopurinol  (ZYLOPRIM ) 100 MG tablet TAKE 1 TABLET BY MOUTH EVERY DAY 90 tablet 1   chlorthalidone  (HYGROTON ) 25 MG tablet TAKE 1 TABLET (25 MG TOTAL) BY MOUTH DAILY. 90 tablet 2   clopidogrel  (PLAVIX ) 75 MG tablet TAKE 1 TABLET BY MOUTH EVERY DAY WITH BREAKFAST 90 tablet 2   estradiol  (ESTRACE  VAGINAL) 0.1 MG/GM vaginal cream Apply a pea sized amount just inside the vagina nightly for 2 weeks, then twice weekly thereafter (Patient taking differently: Place 1 Applicatorful vaginally at bedtime. Apply a pea sized amount just inside the vagina nightly for 2 weeks, then twice weekly thereafter) 42.5 g 12   ezetimibe  (ZETIA ) 10 MG tablet Take 1 tablet (10 mg total) by mouth daily. 90 tablet 3   furosemide  (LASIX ) 20 MG tablet TAKE 1 TABLET BY MOUTH EVERY DAY AS NEEDED 90 tablet 2   isosorbide  mononitrate (IMDUR ) 30 MG 24 hr tablet TAKE 1 TABLET BY MOUTH EVERY DAY 90 tablet 3   losartan  (COZAAR ) 100 MG tablet Take 1 tablet (100 mg total) by mouth daily. 90 tablet 3   metoprolol  succinate (TOPROL -XL) 100 MG 24 hr tablet TAKE 1 TABLET BY MOUTH EVERY DAY WITH OR IMMEDIATELY FOLLOWING A MEAL 90 tablet 3   pantoprazole  (PROTONIX ) 40 MG tablet TAKE 1 TABLET BY MOUTH EVERY DAY 90 tablet 2   spironolactone  (ALDACTONE ) 25 MG tablet TAKE 1/2 TABLET BY MOUTH EVERY DAY 45 tablet 1   traMADol  (ULTRAM ) 50 MG tablet Take 0.5 tablets (25 mg total) by mouth every 12 (twelve) hours as needed. 30 tablet 2   triamcinolone  cream (KENALOG ) 0.1 % Apply topically daily as needed (for leg dermatitis.). 45 g 1   No current facility-administered medications on file prior to visit.    Past Medical History:  Diagnosis Date   Allergic rhinitis     Allergy    Anxiety    Arthritis    Ascending aorta dilation (HCC) 09/13/2022   Echo 09/12/2022: EF 50-55, GR 1 DD, mildly reduced RVSF, normal PASP (RVSP 17.7), mild dilation of ascending aorta (38 mm), RAP 3   Breast cancer (HCC) 04/28/2020   rigtht breast   Bronchitis    CAD (coronary artery disease)    1/19 PCI/DES to pLCX for ISR, normal EF.    Cataract    Cervical dysplasia    unsure of procedure, possible burning in her late 64s   CHF (congestive heart failure) (HCC)    Echo 06/2019: EF 55-60, elevated LVEDP, normal RV SF, mild MAC, mild MR, trivial TR   Complication of anesthesia    COPD (chronic obstructive pulmonary disease) (HCC)    early    Depression    GERD (gastroesophageal reflux disease)    Gout    Heart murmur    Hyperlipidemia    Hypertension    Low back pain    Myocardial infarction (HCC)  Overactive bladder    PONV (postoperative nausea and vomiting)    Sleep apnea    Allergies  Allergen Reactions   Ak-Mycin [Erythromycin] Rash   Sulfamethoxazole Rash    Social History   Socioeconomic History   Marital status: Married    Spouse name: Not on file   Number of children: 2   Years of education: Not on file   Highest education level: 12th grade  Occupational History   Occupation: retired   Occupation: retired    Comment: Production designer, theatre/television/film  Tobacco Use   Smoking status: Former    Current packs/day: 0.00    Average packs/day: 2.0 packs/day for 52.0 years (104.0 ttl pk-yrs)    Types: Cigarettes    Start date: 1966    Quit date: 2018    Years since quitting: 7.5   Smokeless tobacco: Never   Tobacco comments:    completely quit May of 2018; period of years she did not smoke   Vaping Use   Vaping status: Never Used  Substance and Sexual Activity   Alcohol use: Yes    Comment: seldom   Drug use: No   Sexual activity: Not Currently  Other Topics Concern   Not on file  Social History Narrative   Not on file   Social Drivers of Health    Financial Resource Strain: Low Risk  (04/27/2024)   Overall Financial Resource Strain (CARDIA)    Difficulty of Paying Living Expenses: Not very hard  Food Insecurity: Food Insecurity Present (04/27/2024)   Hunger Vital Sign    Worried About Running Out of Food in the Last Year: Sometimes true    Ran Out of Food in the Last Year: Never true  Transportation Needs: No Transportation Needs (04/27/2024)   PRAPARE - Administrator, Civil Service (Medical): No    Lack of Transportation (Non-Medical): No  Physical Activity: Inactive (04/27/2024)   Exercise Vital Sign    Days of Exercise per Week: 0 days    Minutes of Exercise per Session: Not on file  Stress: Stress Concern Present (04/27/2024)   [employer] of Occupational Health - Occupational Stress Questionnaire    Feeling of Stress: To some extent  Social Connections: Moderately Isolated (04/27/2024)   Social Connection and Isolation Panel    Frequency of Communication with Friends and Family: More than three times a week    Frequency of Social Gatherings with Friends and Family: Not on file    Attends Religious Services: Never    Active Member of Clubs or Organizations: No    Attends Banker Meetings: Not on file    Marital Status: Married   Vitals:   04/29/24 1443  BP: 120/70  Pulse: 94  Resp: 16  SpO2: 96%   Body mass index is 37.16 kg/m.  Physical Exam Vitals and nursing note reviewed.  Constitutional:      General: She is not in acute distress.    Appearance: She is well-developed.  HENT:     Head: Normocephalic and atraumatic.  Eyes:     Conjunctiva/sclera: Conjunctivae normal.  Cardiovascular:     Rate and Rhythm: Normal rate and regular rhythm.     Heart sounds: No murmur heard.    Comments: Lower extremity erythema L>R. LLE lymphedema. DP pulses palpable Pulmonary:     Effort: Pulmonary effort is normal. No respiratory distress.     Breath sounds: Normal breath sounds.   Abdominal:     Palpations: Abdomen is soft. There  is no mass.     Tenderness: There is no abdominal tenderness.  Skin:    General: Skin is warm.     Findings: No rash.     Comments: Pretibial area of left lower extremity with lichenification changes and mild erythema.  I do not appreciate ulcers or vesicular lesions. Pedal trace pitting edema, bilateral.  Neurological:     General: No focal deficit present.     Mental Status: She is alert and oriented to person, place, and time.     Comments: Unstable gait assisted with a walker.  Psychiatric:        Mood and Affect: Mood and affect normal.    ASSESSMENT AND PLAN: Orders Placed This Encounter  Procedures   Stool culture   C. difficile, PCR([facility])   Ova and Parasite Exam   Basic metabolic panel with GFR   Microalbumin / creatinine urine ratio   CBC   VITAMIN D  25 Hydroxy (Vit-D Deficiency, Fractures)   Ambulatory referral to Gastroenterology   Lab Results  Component Value Date   NA 137 04/29/2024   CL 102 04/29/2024   K 4.1 04/29/2024   CO2 27 04/29/2024   BUN 49 (H) 04/29/2024   CREATININE 1.93 (H) 04/29/2024   GFR 24.94 (L) 04/29/2024   CALCIUM  9.3 04/29/2024   ALBUMIN 3.0 (L) 10/11/2022   GLUCOSE 94 04/29/2024   Lab Results  Component Value Date   MICROALBUR 1.7 04/29/2024   Lab Results  Component Value Date   WBC 8.1 04/29/2024   HGB 12.8 04/29/2024   HCT 39.6 04/29/2024   MCV 89.1 04/29/2024   PLT 214.0 04/29/2024   Lab Results  Component Value Date   VD25OH 32.30 04/29/2024   Stage 3b chronic kidney disease (HCC) Assessment & Plan: Last e GFR 33 and Cr 1.5. She has been referred to nephrology in the past, missed appointment due to hospitalization, has not rescheduled. Continue adequate hydration, low-salt diet, and avoidance of NSAIDs. Currently on losartan  100 mg daily.  Orders: -     Basic metabolic panel with GFR; Future -     Microalbumin / creatinine urine ratio; Future -      VITAMIN D  25 Hydroxy (Vit-D Deficiency, Fractures); Future  PAD (peripheral artery disease) (HCC) Assessment & Plan: Asymptomatic. We discussed diagnosis, prognosis, and goals of treatment. LDL 113 in 08/2022. Currently on Plavix  75 mg daily. Recommend changing pravastatin  to rosuvastatin  40 mg daily. Continue Zetia  10 mg daily. Recommend walking daily as tolerated for 10 minutes and monitor for signs of claudication.  Orders: -     Rosuvastatin  Calcium ; Take 1 tablet (40 mg total) by mouth daily.  Dispense: 30 tablet; Refill: 1  Diarrhea, unspecified type -     CBC; Future -     Ambulatory referral to Gastroenterology -     Stool culture; Future -     Clostridium Difficile by PCR; Future -     Ova and parasite examination; Future  Mixed hyperlipidemia Assessment & Plan: She has not tolerated atorvastatin  in the past. Currently on pravastatin  40 mg daily and Zetia  10 mg daily. LDL 113 in 08/2022. She agrees with changing pravastatin  for rosuvastatin  40 mg daily. Continue Zetia  10 mg daily and low-fat diet. She is not fasting today, we can plan for fasting labs next follow-up visit.  Orders: -     Rosuvastatin  Calcium ; Take 1 tablet (40 mg total) by mouth daily.  Dispense: 30 tablet; Refill: 1  Atherosclerosis of native coronary artery  of native heart with angina pectoris [facility]) Assessment & Plan: Has been asymptomatic. She needs refill for nitroglycerin  sublingual. Due for appointment with cardiologist in 05/2024, planning on arranging appointment.  Orders: -     Nitroglycerin ; Q 5 min x 3 in 24 hours as needed.  Dispense: 25 tablet; Refill: 6  I spent a total of 44 minutes in both face to face and non face to face activities for this visit on the date of this encounter. During this time history was obtained and documented, examination was performed, prior labs/imaging reviewed, and assessment/plan discussed.  Return in about 4 months (around 08/30/2024).  Ezri Landers G.  [place], MD  [facility] [place] office.

## 2024-04-29 NOTE — Assessment & Plan Note (Signed)
 Has been asymptomatic. She needs refill for nitroglycerin  sublingual. Due for appointment with cardiologist in 05/2024, planning on arranging appointment.

## 2024-04-29 NOTE — Assessment & Plan Note (Signed)
 She has not tolerated atorvastatin  in the past. Currently on pravastatin  40 mg daily and Zetia  10 mg daily. LDL 113 in 08/2022. She agrees with changing pravastatin  for rosuvastatin  40 mg daily. Continue Zetia  10 mg daily and low-fat diet. She is not fasting today, we can plan for fasting labs next follow-up visit.

## 2024-04-29 NOTE — Assessment & Plan Note (Signed)
 Last e GFR 33 and Cr 1.5. She has been referred to nephrology in the past, missed appointment due to hospitalization, has not rescheduled. Continue adequate hydration, low-salt diet, and avoidance of NSAIDs. Currently on losartan  100 mg daily.

## 2024-04-29 NOTE — Assessment & Plan Note (Signed)
 Asymptomatic. We discussed diagnosis, prognosis, and goals of treatment. LDL 113 in 08/2022. Currently on Plavix  75 mg daily. Recommend changing pravastatin  to rosuvastatin  40 mg daily. Continue Zetia  10 mg daily. Recommend walking daily as tolerated for 10 minutes and monitor for signs of claudication.

## 2024-04-29 NOTE — Patient Instructions (Signed)
 A few things to remember from today's visit:  PAD (peripheral artery disease) (HCC) - Plan: rosuvastatin  (CRESTOR ) 40 MG tablet  Diarrhea, unspecified type - Plan: CBC, Cdiff NAA+O+P+Stool Culture, Ambulatory referral to Gastroenterology  Stage 3 chronic kidney disease, unspecified whether stage 3a or 3b CKD (HCC) - Plan: Basic metabolic panel with GFR, Microalbumin / creatinine urine ratio  Mixed hyperlipidemia - Plan: rosuvastatin  (CRESTOR ) 40 MG tablet  Arrange appt with cardiologist. Stop Pravastatin  and try Rosuvastatin . No changes in Zetia . 10 min of daily walking.  If you need refills for medications you take chronically, please call your pharmacy. Do not use My Chart to request refills or for acute issues that need immediate attention. If you send a my chart message, it may take a few days to be addressed, specially if I am not in the office.  Please be sure medication list is accurate. If a new problem present, please set up appointment sooner than planned today.

## 2024-04-30 ENCOUNTER — Telehealth: Payer: Self-pay

## 2024-04-30 ENCOUNTER — Ambulatory Visit: Payer: Self-pay | Admitting: Family Medicine

## 2024-04-30 DIAGNOSIS — R197 Diarrhea, unspecified: Secondary | ICD-10-CM | POA: Diagnosis not present

## 2024-04-30 DIAGNOSIS — N1832 Chronic kidney disease, stage 3b: Secondary | ICD-10-CM

## 2024-04-30 LAB — BASIC METABOLIC PANEL WITH GFR
BUN: 49 mg/dL — ABNORMAL HIGH (ref 6–23)
CO2: 27 meq/L (ref 19–32)
Calcium: 9.3 mg/dL (ref 8.4–10.5)
Chloride: 102 meq/L (ref 96–112)
Creatinine, Ser: 1.93 mg/dL — ABNORMAL HIGH (ref 0.40–1.20)
GFR: 24.94 mL/min — ABNORMAL LOW (ref >60.00)
Glucose, Bld: 94 mg/dL (ref 70–99)
Potassium: 4.1 meq/L (ref 3.5–5.1)
Sodium: 137 meq/L (ref 135–145)

## 2024-04-30 LAB — MICROALBUMIN / CREATININE URINE RATIO
Creatinine,U: 179.2 mg/dL
Microalb Creat Ratio: 9.2 mg/g (ref 0.0–30.0)
Microalb, Ur: 1.7 mg/dL (ref 0.0–1.9)

## 2024-04-30 LAB — CBC
HCT: 39.6 % (ref 36.0–46.0)
Hemoglobin: 12.8 g/dL (ref 12.0–15.0)
MCHC: 32.2 g/dL (ref 30.0–36.0)
MCV: 89.1 fl (ref 78.0–100.0)
Platelets: 214 K/uL (ref 150.0–400.0)
RBC: 4.45 Mil/uL (ref 3.87–5.11)
RDW: 17.5 % — ABNORMAL HIGH (ref 11.5–15.5)
WBC: 8.1 K/uL (ref 4.0–10.5)

## 2024-04-30 LAB — VITAMIN D 25 HYDROXY (VIT D DEFICIENCY, FRACTURES): VITD: 32.3 ng/mL (ref 30.00–100.00)

## 2024-04-30 NOTE — Telephone Encounter (Signed)
 I spoke with the lab, both specimens are room temp.   I called and spoke with pt, she is aware both specimens are room temp & to collect them and place the sample in each container.

## 2024-04-30 NOTE — Telephone Encounter (Signed)
 Copied from CRM [id number]. Topic: Clinical - Medical Advice >> Apr 30, 2024 12:40 PM Henretta I wrote: Reason for CRM: Patient has a stool sample to take that was given by Dr.Jordan and would like to know how exactly to do it properly as she lost the instructions.

## 2024-05-01 ENCOUNTER — Encounter: Admitting: Rehabilitation

## 2024-05-01 ENCOUNTER — Other Ambulatory Visit: Payer: Self-pay

## 2024-05-02 ENCOUNTER — Telehealth: Payer: Self-pay | Admitting: *Deleted

## 2024-05-02 LAB — CLOSTRIDIUM DIFFICILE BY PCR: Toxigenic C. Difficile by PCR: NEGATIVE

## 2024-05-02 NOTE — Telephone Encounter (Signed)
 Copied from CRM [id number]. Topic: Referral - Request for Referral >> May 02, 2024  3:42 PM Carlyon D wrote: Did the patient discuss referral with their provider in the last year? Yes (If No - schedule appointment) (If Yes - send message)  Appointment offered? Yes  Type of order/referral and detailed reason for visit: Yes  Preference of office, provider, location: N/A Pt said she has been referred to kidney Dr. But now needs a new Referral   If referral order, have you been seen by this specialty before? No (If Yes, this issue or another issue? When? Where?  Can we respond through MyChart? Yes

## 2024-05-03 LAB — OVA AND PARASITE EXAMINATION
CONCENTRATE RESULT:: NONE SEEN
MICRO NUMBER:: 16700955
SPECIMEN QUALITY:: ADEQUATE
TRICHROME RESULT:: NONE SEEN

## 2024-05-03 NOTE — Telephone Encounter (Signed)
 Referral placed.

## 2024-05-05 LAB — STOOL CULTURE: E coli, Shiga toxin Assay: NEGATIVE

## 2024-05-06 ENCOUNTER — Encounter: Admitting: Rehabilitation

## 2024-05-07 ENCOUNTER — Encounter: Payer: Self-pay | Admitting: Rehabilitation

## 2024-05-08 ENCOUNTER — Encounter: Admitting: Rehabilitation

## 2024-05-13 ENCOUNTER — Encounter

## 2024-05-13 ENCOUNTER — Telehealth: Payer: Self-pay

## 2024-05-13 DIAGNOSIS — N1832 Chronic kidney disease, stage 3b: Secondary | ICD-10-CM

## 2024-05-13 NOTE — Telephone Encounter (Signed)
-----   Message from [facility] Mckaylee Dimalanta A sent at 05/01/2024  4:50 PM EDT ----- BMP 2 weeks

## 2024-05-13 NOTE — Telephone Encounter (Signed)
 Spoke with pt, lab appt scheduled for Friday at 11am.

## 2024-05-15 ENCOUNTER — Encounter: Admitting: Rehabilitation

## 2024-05-17 ENCOUNTER — Encounter

## 2024-05-17 ENCOUNTER — Other Ambulatory Visit (INDEPENDENT_AMBULATORY_CARE_PROVIDER_SITE_OTHER)

## 2024-05-17 DIAGNOSIS — N1832 Chronic kidney disease, stage 3b: Secondary | ICD-10-CM | POA: Diagnosis not present

## 2024-05-17 LAB — BASIC METABOLIC PANEL WITH GFR
BUN: 63 mg/dL — ABNORMAL HIGH (ref 6–23)
CO2: 26 meq/L (ref 19–32)
Calcium: 9.2 mg/dL (ref 8.4–10.5)
Chloride: 102 meq/L (ref 96–112)
Creatinine, Ser: 1.9 mg/dL — ABNORMAL HIGH (ref 0.40–1.20)
GFR: 25.4 mL/min — ABNORMAL LOW (ref >60.00)
Glucose, Bld: 121 mg/dL — ABNORMAL HIGH (ref 70–99)
Potassium: 4.2 meq/L (ref 3.5–5.1)
Sodium: 138 meq/L (ref 135–145)

## 2024-05-21 ENCOUNTER — Other Ambulatory Visit: Payer: Self-pay | Admitting: Physician Assistant

## 2024-05-21 DIAGNOSIS — I10 Essential (primary) hypertension: Secondary | ICD-10-CM

## 2024-05-22 ENCOUNTER — Other Ambulatory Visit: Payer: Self-pay

## 2024-05-22 ENCOUNTER — Ambulatory Visit: Payer: Self-pay | Admitting: Family Medicine

## 2024-05-22 DIAGNOSIS — I739 Peripheral vascular disease, unspecified: Secondary | ICD-10-CM

## 2024-05-22 DIAGNOSIS — E782 Mixed hyperlipidemia: Secondary | ICD-10-CM

## 2024-05-22 MED ORDER — ROSUVASTATIN CALCIUM 40 MG PO TABS: 40.0000 mg | ORAL_TABLET | Freq: Every day | ORAL | 3 refills | Status: AC

## 2024-06-03 ENCOUNTER — Ambulatory Visit

## 2024-06-03 DIAGNOSIS — J449 Chronic obstructive pulmonary disease, unspecified: Secondary | ICD-10-CM

## 2024-06-03 DIAGNOSIS — Z122 Encounter for screening for malignant neoplasm of respiratory organs: Secondary | ICD-10-CM

## 2024-06-06 ENCOUNTER — Telehealth: Payer: Self-pay | Admitting: Internal Medicine

## 2024-06-06 NOTE — Telephone Encounter (Signed)
 Attempted to reach patient. No answer and phone eventually leads to busy signal.

## 2024-06-06 NOTE — Telephone Encounter (Signed)
 Inbound call from patient stating she has been having diarrhea and rectal bleeding. Patient is concerned and does not wish to wait until October. Also states she recently found out her birth Mother had colon cancer. Please advise, thank you

## 2024-06-06 NOTE — Telephone Encounter (Signed)
 Spoke with patient. Patient reports that she started having diarrhea earlier this week along with bright red blood. I told patient that she likely has hemorrhoidal bleeding due to the diarrhea. Patient states that she is concerned because she found out that her biological mother had colon cancer. Patient last had colon in 2022. I informed patient that the soonest appt we have is 06/27/24 at 3:30 pm with Colleen Kennedy-Smith, NP. Patient accepted appt. Patient has been advised if she has worsening symptoms prior to her appt she will need to contact PCP or visit an urgent care. Patient verbalized understanding and had no concerns at the end of the call.

## 2024-06-10 ENCOUNTER — Other Ambulatory Visit: Payer: Self-pay | Admitting: Cardiovascular Disease

## 2024-06-10 ENCOUNTER — Ambulatory Visit

## 2024-06-10 VITALS — BP 112/65 | HR 78 | Temp 98.5°F | Ht 65.0 in | Wt 215.6 lb

## 2024-06-10 DIAGNOSIS — J449 Chronic obstructive pulmonary disease, unspecified: Secondary | ICD-10-CM

## 2024-06-10 DIAGNOSIS — R911 Solitary pulmonary nodule: Secondary | ICD-10-CM | POA: Diagnosis not present

## 2024-06-10 DIAGNOSIS — Z87891 Personal history of nicotine dependence: Secondary | ICD-10-CM | POA: Diagnosis not present

## 2024-06-10 MED ORDER — ALBUTEROL SULFATE HFA 108 (90 BASE) MCG/ACT IN AERS
2.0000 | INHALATION_SPRAY | Freq: Four times a day (QID) | RESPIRATORY_TRACT | 6 refills | Status: AC | PRN
Start: 2024-06-10 — End: ?

## 2024-06-10 NOTE — Assessment & Plan Note (Signed)
-   Last PFTs completed in 2019.  Plan to recheck these prior to next follow-up appointment.  PFTs order placed. -Continue albuterol  as needed; Rx sent in today to pharmacy of choice.

## 2024-06-10 NOTE — Patient Instructions (Signed)
 Continue Albuterol  every 4-6 hours as needed.  Follow up Low does Chest CT due in March 2026.  Pulmonary function tests (PFTs) in April 2026.  Follow up in clinic April 2026 after CT and PFTs complete; may return sooner if new or worsening symptoms.

## 2024-06-10 NOTE — Progress Notes (Signed)
 @Patient  ID: Tracy [name], female    DOB: [birth year], 78 y.o.   MRN: [id number]  No chief complaint on file.   Referring provider: [place], [name], MD  HPI: Tracy [name] is a 78 year old female with past medical history of COPD, allergic rhinitis, hypertension, chronic heart failure with preserved ejection fraction who presents today as a follow-up visit.  She was last seen in office via telemedicine in March 2025 and participates our lung cancer screening program.  Today we reviewed her CT results which revealed moderate centrilobular emphysema with diffuse bronchial wall thickening.  There were 2 scattered solid subpleural pulmonary nodules with the largest measuring 5.9 mm which is unchanged from prior CT in August 2023.  No new nodules were seen.  Full report below.  She reports that on a day-to-day basis she does not require a rescue inhaler.  She denies dyspnea on exertion, chest pain, fever, chills, headache, dizziness, night sweats, weight loss.  She does report that she has a productive cough of clear to white mucus.  She uses Mucinex  twice daily for this and feels that it helps.  Today she requests a refill of her albuterol .  She states that in December 2024 she had to be seen in the emergency room due to shortness of breath and that she was out of her albuterol  at that time.  She feels that this would have gotten better if she had had albuterol .  She reports no other complaints and feels that she is at her baseline with respect her breathing.  TEST/EVENTS :  CT chest lung cancer screening completed 12/27/2023: IMPRESSION: 1. Lung-RADS 2, benign appearance or behavior. Continue annual screening with low-dose chest CT without contrast in 12 months. 2. Three-vessel coronary atherosclerosis. 3. Aortic Atherosclerosis (ICD10-I70.0) and Emphysema (ICD10-J43.9).  Allergies  Allergen Reactions   Ak-Mycin [Erythromycin] Rash   Sulfamethoxazole Rash    Immunization History   Administered Date(s) Administered   Fluad Quad(high Dose 65+) 06/13/2019, 07/02/2020, 07/21/2022   Fluad Trivalent(High Dose 65+) 07/18/2023   INFLUENZA, HIGH DOSE SEASONAL PF 09/05/2014, 08/05/2015, 07/11/2016, 07/31/2017, 07/31/2018, 07/19/2021   Influenza Split 07/22/2011, 07/23/2012   Influenza Whole 08/17/2006, 07/02/2010   Influenza-Unspecified 07/17/2013, 07/17/2020   PFIZER Comirnaty(Gray Top)Covid-19 Tri-Sucrose Vaccine 02/27/2021   PFIZER(Purple Top)SARS-COV-2 Vaccination 11/29/2019, 12/24/2019, 08/05/2020   PNEUMOCOCCAL CONJUGATE-20 08/16/2021   Pfizer Covid-19 Vaccine Bivalent Booster 24yrs & up 07/19/2021   Pneumococcal Conjugate-13 09/09/2013   Pneumococcal Polysaccharide-23 08/05/2015   Tdap 08/05/2011, 04/18/2021    Past Medical History:  Diagnosis Date   Allergic rhinitis    Allergy    Anxiety    Arthritis    Ascending aorta dilation (HCC) 09/13/2022   Echo 09/12/2022: EF 50-55, GR 1 DD, mildly reduced RVSF, normal PASP (RVSP 17.7), mild dilation of ascending aorta (38 mm), RAP 3   Breast cancer (HCC) 04/28/2020   rigtht breast   Bronchitis    CAD (coronary artery disease)    1/19 PCI/DES to pLCX for ISR, normal EF.    Cataract    Cervical dysplasia    unsure of procedure, possible burning in her late 54s   CHF (congestive heart failure) (HCC)    Echo 06/2019: EF 55-60, elevated LVEDP, normal RV SF, mild MAC, mild MR, trivial TR   Complication of anesthesia    COPD (chronic obstructive pulmonary disease) (HCC)    early    Depression    GERD (gastroesophageal reflux disease)    Gout    Heart murmur  Hyperlipidemia    Hypertension    Low back pain    Myocardial infarction (HCC)    Overactive bladder    PONV (postoperative nausea and vomiting)    Sleep apnea     Tobacco History: Social History   Tobacco Use  Smoking Status Former   Current packs/day: 0.00   Average packs/day: 2.0 packs/day for 52.0 years (104.0 ttl pk-yrs)   Types:  Cigarettes   Start date: 1966   Quit date: 2018   Years since quitting: 7.6  Smokeless Tobacco Never  Tobacco Comments   completely quit May of 2018; period of years she did not smoke    Counseling given: Not Answered Tobacco comments: completely quit May of 2018; period of years she did not smoke    Outpatient Medications Prior to Visit  Medication Sig Dispense Refill   acetaminophen  (TYLENOL ) 650 MG CR tablet Take 1,300 mg by mouth 2 (two) times daily as needed for pain.     allopurinol  (ZYLOPRIM ) 100 MG tablet TAKE 1 TABLET BY MOUTH EVERY DAY 90 tablet 1   chlorthalidone  (HYGROTON ) 25 MG tablet TAKE 1 TABLET (25 MG TOTAL) BY MOUTH DAILY. 90 tablet 2   clopidogrel  (PLAVIX ) 75 MG tablet TAKE 1 TABLET BY MOUTH EVERY DAY WITH BREAKFAST 90 tablet 2   estradiol  (ESTRACE  VAGINAL) 0.1 MG/GM vaginal cream Apply a pea sized amount just inside the vagina nightly for 2 weeks, then twice weekly thereafter (Patient taking differently: Place 1 Applicatorful vaginally at bedtime. Apply a pea sized amount just inside the vagina nightly for 2 weeks, then twice weekly thereafter) 42.5 g 12   ezetimibe  (ZETIA ) 10 MG tablet Take 1 tablet (10 mg total) by mouth daily. 90 tablet 3   furosemide  (LASIX ) 20 MG tablet TAKE 1 TABLET BY MOUTH EVERY DAY AS NEEDED 90 tablet 2   isosorbide  mononitrate (IMDUR ) 30 MG 24 hr tablet TAKE 1 TABLET BY MOUTH EVERY DAY 90 tablet 3   losartan  (COZAAR ) 100 MG tablet Take 1 tablet (100 mg total) by mouth daily. 90 tablet 3   metoprolol  succinate (TOPROL -XL) 100 MG 24 hr tablet TAKE 1 TABLET BY MOUTH EVERY DAY WITH OR IMMEDIATELY FOLLOWING A MEAL 90 tablet 3   nitroGLYCERIN  (NITROSTAT ) 0.4 MG SL tablet Q 5 min x 3 in 24 hours as needed. 25 tablet 6   pantoprazole  (PROTONIX ) 40 MG tablet TAKE 1 TABLET BY MOUTH EVERY DAY 90 tablet 2   rosuvastatin  (CRESTOR ) 40 MG tablet Take 1 tablet (40 mg total) by mouth daily. 90 tablet 3   traMADol  (ULTRAM ) 50 MG tablet Take 0.5 tablets (25 mg  total) by mouth every 12 (twelve) hours as needed. 30 tablet 2   triamcinolone  cream (KENALOG ) 0.1 % Apply topically daily as needed (for leg dermatitis.). 45 g 1   No facility-administered medications prior to visit.     Review of Systems:   Constitutional:   No  weight loss, night sweats,  Fevers, chills, fatigue, or  lassitude.  HEENT:   No headaches,  Difficulty swallowing,  Tooth/dental problems, or  Sore throat,                No sneezing, itching, ear ache, nasal congestion, post nasal drip,   CV:  No chest pain,  Orthopnea, PND, swelling in lower extremities, anasarca, dizziness, palpitations, syncope.   GI  No heartburn, indigestion, abdominal pain, nausea, vomiting, diarrhea, change in bowel habits, loss of appetite, bloody stools.   Resp: No shortness of breath  with exertion or at rest.  No excess mucus, no productive cough,  No non-productive cough,  No coughing up of blood.  No change in color of mucus.  No wheezing.  No chest wall deformity  Skin: no rash or lesions.  GU: no dysuria, change in color of urine, no urgency or frequency.  No flank pain, no hematuria   MS:  No joint pain or swelling.  No decreased range of motion.  No back pain.    Physical Exam  BP 112/65   Pulse 78   Temp 98.5 F (36.9 C) (Temporal)   Ht 5' 5 (1.651 m)   Wt 215 lb 9.6 oz (97.8 kg)   SpO2 97%   BMI 35.88 kg/m   GEN: A/Ox3; pleasant , NAD, well nourished    HEENT:  [place]/AT,  EACs-clear, TMs-wnl, NOSE-clear, THROAT-clear, no lesions, no postnasal drip or exudate noted.   NECK:  Supple w/ fair ROM; no JVD; normal carotid impulses w/o bruits; no thyromegaly or nodules palpated; no lymphadenopathy.    RESP  Clear  P & A; w/o, wheezes/ rales/ or rhonchi. no accessory muscle use, no dullness to percussion  CARD:  RRR, no m/r/g, no peripheral edema, pulses intact, no cyanosis or clubbing.  GI:   Soft & nt; nml bowel sounds; no organomegaly or masses detected.   Musco: Warm bil, no  deformities or joint swelling noted.   Neuro: alert, no focal deficits noted.    Skin: Warm, no lesions or rashes    Lab Results:  CBC    Component Value Date/Time   WBC 8.1 04/29/2024 1552   RBC 4.45 04/29/2024 1552   HGB 12.8 04/29/2024 1552   HGB 13.0 09/22/2020 1213   HCT 39.6 04/29/2024 1552   HCT 40.4 09/22/2020 1213   PLT 214.0 04/29/2024 1552   PLT 255 09/22/2020 1213   MCV 89.1 04/29/2024 1552   MCV 84 09/22/2020 1213   MCH 26.5 10/03/2023 0358   MCHC 32.2 04/29/2024 1552   RDW 17.5 (H) 04/29/2024 1552   RDW 15.4 09/22/2020 1213   LYMPHSABS 2.6 04/18/2023 1215   LYMPHSABS 4.0 (H) 11/03/2017 0745   MONOABS 1.0 04/18/2023 1215   EOSABS 0.3 04/18/2023 1215   EOSABS 0.4 11/03/2017 0745   BASOSABS 0.1 04/18/2023 1215   BASOSABS 0.1 11/03/2017 0745    BMET    Component Value Date/Time   NA 138 05/17/2024 1036   NA 143 12/12/2022 1021   K 4.2 05/17/2024 1036   CL 102 05/17/2024 1036   CO2 26 05/17/2024 1036   GLUCOSE 121 (H) 05/17/2024 1036   BUN 63 (H) 05/17/2024 1036   BUN 29 (H) 12/12/2022 1021   CREATININE 1.90 (H) 05/17/2024 1036   CREATININE 1.05 (H) 09/13/2016 0738   CALCIUM  9.2 05/17/2024 1036   GFRNONAA 44 (L) 10/05/2023 0408   GFRAA 49 (L) 09/22/2020 1213    BNP    Component Value Date/Time   BNP 507.0 (H) 09/29/2023 0639    ProBNP    Component Value Date/Time   PROBNP 241 08/24/2022 1224   PROBNP 234.0 (H) 09/24/2018 1627    Imaging: No results found.  Administration History     None          Latest Ref Rng & Units 05/25/2018   10:26 AM  PFT Results  FVC-Pre L 2.05   FVC-Predicted Pre % 71   FVC-Post L 1.88   FVC-Predicted Post % 66   Pre FEV1/FVC % % 78  Post FEV1/FCV % % 78   FEV1-Pre L 1.59   FEV1-Predicted Pre % 74   FEV1-Post L 1.46   DLCO uncorrected ml/min/mmHg 13.62   DLCO UNC% % 60   DLVA Predicted % 81   TLC L 5.83   TLC % Predicted % 119   RV % Predicted % 177     No results found for:  NITRICOXIDE   Assessment & Plan:   Assessment & Plan Chronic obstructive pulmonary disease, unspecified COPD type (HCC) - Last PFTs completed in 2019.  Plan to recheck these prior to next follow-up appointment.  PFTs order placed. -Continue albuterol  as needed; Rx sent in today to pharmacy of choice. Lung nodule - Lung RADS category 2; plan for follow-up chest CT in March 2026. - Follow-up chest CT ordered today for March 2026.   Return in about 8 months (around 02/08/2025) for PFT, CT results.  Candis Dandy, PA-C 06/10/2024

## 2024-06-16 ENCOUNTER — Encounter (HOSPITAL_COMMUNITY): Payer: Self-pay | Admitting: Emergency Medicine

## 2024-06-16 ENCOUNTER — Other Ambulatory Visit: Payer: Self-pay

## 2024-06-16 ENCOUNTER — Ambulatory Visit (HOSPITAL_COMMUNITY)
Admission: EM | Admit: 2024-06-16 | Discharge: 2024-06-16 | Disposition: A | Discharge status: Home / Self Care | Attending: Internal Medicine | Admitting: Internal Medicine

## 2024-06-16 DIAGNOSIS — L97222 Non-pressure chronic ulcer of left calf with fat layer exposed: Secondary | ICD-10-CM | POA: Diagnosis not present

## 2024-06-16 DIAGNOSIS — L03116 Cellulitis of left lower limb: Secondary | ICD-10-CM

## 2024-06-16 DIAGNOSIS — Z23 Encounter for immunization: Secondary | ICD-10-CM | POA: Diagnosis not present

## 2024-06-16 MED ORDER — MUPIROCIN 2 % EX OINT: 1.0000 | TOPICAL_OINTMENT | Freq: Two times a day (BID) | CUTANEOUS | 1 refills | Status: AC

## 2024-06-16 MED ORDER — DOXYCYCLINE HYCLATE 100 MG PO CAPS
100.0000 mg | ORAL_CAPSULE | Freq: Two times a day (BID) | ORAL | 0 refills | Status: DC
Start: 2024-06-16 — End: 2024-06-27

## 2024-06-16 MED ORDER — TETANUS-DIPHTH-ACELL PERTUSSIS 5-2.5-18.5 LF-MCG/0.5 IM SUSY
PREFILLED_SYRINGE | INTRAMUSCULAR | Status: AC
  Filled 2024-06-16: qty 0.5

## 2024-06-16 MED ORDER — TETANUS-DIPHTH-ACELL PERTUSSIS 5-2.5-18.5 LF-MCG/0.5 IM SUSY
0.5000 mL | PREFILLED_SYRINGE | Freq: Once | INTRAMUSCULAR | Status: AC
  Administered 2024-06-16: 0.5 mL via INTRAMUSCULAR

## 2024-06-16 NOTE — ED Triage Notes (Signed)
 Pt states she hit her self with the car door on her left leg  2 weeks ago and now her leg is throbbing and red. Pt requesting to have a tetanus vaccine.

## 2024-06-16 NOTE — ED Provider Notes (Signed)
 [facility]    CSN: [id number] Arrival date & time: 06/16/24  1329      History   Chief Complaint Chief Complaint  Patient presents with   Wound Check    HPI Tracy [name] is a 78 y.o. female.   78 year old female presents urgent care with complaints of an open wound to the left lateral calf that has not healed after 2 weeks.  She reports that 2 weeks ago that she had her leg with a car door.  It bled profusely at that time and she did call EMS and they applied a bandage.  Since then the area has continued to stay open.  She has noticed some increased redness around the area as well.  She does have chronic lymphedema in this lower extremity and has been unable to do her normal wraps secondary to the open wound so her swelling is worse than usual although not severe.  She denies any shortness of breath, chest pain, fevers.  She has had some chills.  She did check with the pharmacy and her tetanus is not up-to-date so she would like to update this today.   Wound Check Pertinent negatives include no chest pain, no abdominal pain and no shortness of breath.    Past Medical History:  Diagnosis Date   Allergic rhinitis    Allergy    Anxiety    Arthritis    Ascending aorta dilation (HCC) 09/13/2022   Echo 09/12/2022: EF 50-55, GR 1 DD, mildly reduced RVSF, normal PASP (RVSP 17.7), mild dilation of ascending aorta (38 mm), RAP 3   Breast cancer (HCC) 04/28/2020   rigtht breast   Bronchitis    CAD (coronary artery disease)    1/19 PCI/DES to pLCX for ISR, normal EF.    Cataract    Cervical dysplasia    unsure of procedure, possible burning in her late 38s   CHF (congestive heart failure) (HCC)    Echo 06/2019: EF 55-60, elevated LVEDP, normal RV SF, mild MAC, mild MR, trivial TR   Complication of anesthesia    COPD (chronic obstructive pulmonary disease) (HCC)    early    Depression    GERD (gastroesophageal reflux disease)    Gout    Heart murmur     Hyperlipidemia    Hypertension    Low back pain    Myocardial infarction (HCC)    Overactive bladder    PONV (postoperative nausea and vomiting)    Sleep apnea     Patient Active Problem List   Diagnosis Date Noted   PAD (peripheral artery disease) (HCC) 04/29/2024   Weakness 10/05/2023   Bronchopneumonia 10/05/2023   Multifocal pneumonia 09/29/2023   Hypokalemia 08/07/2023   Intertrigo 04/18/2023   Venous stasis dermatitis of both lower extremities 11/28/2022   Ascending aorta dilation (HCC) 09/13/2022   Dizziness 01/19/2022   Frequent falls 01/19/2022   Tremor of both hands 08/16/2021   Aortic atherosclerosis (HCC) 06/15/2021   Generalized osteoarthritis of multiple sites 12/25/2020   Chronic pain disorder 12/25/2020   Pelvic mass in female    Bilateral lower extremity edema 04/13/2020   Shortness of breath    Anxiety disorder 10/29/2018   CKD (chronic kidney disease), stage III (HCC) 06/27/2018   COPD (chronic obstructive pulmonary disease) (HCC) 05/25/2018   Obesity 03/20/2018   Dyspnea on exertion 11/06/2017   Abnormal stress test 11/06/2017   Chronic heart failure with preserved ejection fraction (HCC) 09/04/2017   Left ventricular dysfunction 07/31/2017  Cataract, nuclear sclerotic senile, bilateral 02/01/2017   Gouty arthritis of toe of left foot 08/09/2016   Hyperuricemia 08/09/2016   Osteoarthritis (arthritis due to wear and tear of joints) 01/14/2014   Atherosclerosis of native coronary artery of native heart with angina pectoris (HCC) 08/02/2011   Mitral regurgitation 08/02/2011   OVERACTIVE BLADDER 07/02/2010   ACUTE CYSTITIS 05/25/2010   Mixed hyperlipidemia 08/11/2009   Leg edema, left 07/22/2009   GERD 04/11/2008   Constipation 04/11/2008   Essential hypertension 05/30/2007   MYOCARDIAL INFARCTION, HX OF 05/30/2007   Allergic rhinitis 05/30/2007   LOW BACK PAIN 05/30/2007    Past Surgical History:  Procedure Laterality Date   ABDOMINAL  HYSTERECTOMY     Partial   ANGIOPLASTY     stent 2007   BREAST EXCISIONAL BIOPSY Right 04/2020   ADH/LCIS   BREAST LUMPECTOMY WITH RADIOACTIVE SEED LOCALIZATION Right 04/28/2020   Procedure: RIGHT BREAST LUMPECTOMY X 2  WITH RADIOACTIVE SEED LOCALIZATION;  Surgeon: Belinda Cough, MD;  Location: [facility];  Service: General;  Laterality: Right;  LMA   BREAST SURGERY     CARDIAC CATHETERIZATION     COLONOSCOPY     CORONARY STENT INTERVENTION N/A 11/06/2017   Procedure: CORONARY STENT INTERVENTION;  Surgeon: Mady Bruckner, MD;  [facility]: [facility];  Service: Cardiovascular;  Laterality: N/A;   EYE SURGERY     LEFT HEART CATH AND CORONARY ANGIOGRAPHY N/A 11/06/2017   Procedure: LEFT HEART CATH AND CORONARY ANGIOGRAPHY;  Surgeon: Mady Bruckner, MD;  [facility]: [facility];  Service: Cardiovascular;  Laterality: N/A;   LEFT HEART CATH AND CORONARY ANGIOGRAPHY N/A 07/24/2019   Procedure: LEFT HEART CATH AND CORONARY ANGIOGRAPHY;  Surgeon: Verlin Bruckner BIRCH, MD;  [facility]: [facility];  Service: Cardiovascular;  Laterality: N/A;   RIGHT/LEFT HEART CATH AND CORONARY ANGIOGRAPHY N/A 09/29/2020   Procedure: RIGHT/LEFT HEART CATH AND CORONARY ANGIOGRAPHY;  Surgeon: Claudene Victory ORN, MD;  Location: [facility];  Service: Cardiovascular;  Laterality: N/A;   ROBOTIC ASSISTED BILATERAL SALPINGO OOPHERECTOMY Bilateral 11/10/2020   Procedure: XI ROBOTIC ASSISTED BILATERAL SALPINGO OOPHORECTOMY WITH MINI LAPAROTOMY FOR DRAINAGE;  Surgeon: Viktoria Comer SAUNDERS, MD;  Location: [facility];  Service: Gynecology;  Laterality: Bilateral;  MINI LAP FIRST   TONSILLECTOMY     VULVECTOMY N/A 11/10/2020   Procedure: WIDE EXCISION VULVECTOMY;  Surgeon: Viktoria Comer SAUNDERS, MD;  Location: [facility];  Service: Gynecology;  Laterality: N/A;    OB History     Gravida  2   Para  2   Term      Preterm      AB      Living  2      SAB      IAB      Ectopic       Multiple      Live Births  2            Home Medications    Prior to Admission medications   Medication Sig Start Date End Date Taking? Authorizing Provider  doxycycline  (VIBRAMYCIN ) 100 MG capsule Take 1 capsule (100 mg total) by mouth 2 (two) times daily. 06/16/24  Yes Maurine Mowbray A, PA-C  mupirocin  ointment (BACTROBAN ) 2 % Apply 1 Application topically 2 (two) times daily. 06/16/24  Yes Aqeel Norgaard A, PA-C  acetaminophen  (TYLENOL ) 650 MG CR tablet Take 1,300 mg by mouth 2 (two) times daily as needed for pain.    [provider]  albuterol  (VENTOLIN  HFA) 108 (90 Base) MCG/ACT inhaler Inhale 2 puffs into the lungs every 6 (six) hours as needed for wheezing or shortness of breath. 06/10/24   Charley Conger, PA-C  allopurinol  (ZYLOPRIM ) 100 MG tablet TAKE 1 TABLET BY MOUTH EVERY DAY 03/01/24   [place], [name], MD  chlorthalidone  (HYGROTON ) 25 MG tablet TAKE 1 TABLET (25 MG TOTAL) BY MOUTH DAILY. 02/09/24   [place], [name], MD  clopidogrel  (PLAVIX ) 75 MG tablet TAKE 1 TABLET BY MOUTH EVERY DAY WITH BREAKFAST 02/07/23   Wonda Sharper, MD  estradiol  (ESTRACE  VAGINAL) 0.1 MG/GM vaginal cream Apply a pea sized amount just inside the vagina nightly for 2 weeks, then twice weekly thereafter Patient taking differently: Place 1 Applicatorful vaginally at bedtime. Apply a pea sized amount just inside the vagina nightly for 2 weeks, then twice weekly thereafter 09/12/23   Erik Kieth BROCKS, MD  ezetimibe  (ZETIA ) 10 MG tablet TAKE 1 TABLET BY MOUTH DAILY 06/12/24   Wonda Sharper, MD  furosemide  (LASIX ) 20 MG tablet TAKE 1 TABLET BY MOUTH EVERY DAY AS NEEDED 11/22/23   [place], [name], MD  isosorbide  mononitrate (IMDUR ) 30 MG 24 hr tablet TAKE 1 TABLET BY MOUTH EVERY DAY 04/10/23   Lelon Hamilton T, PA-C  losartan  (COZAAR ) 100 MG tablet Take 1 tablet (100 mg total) by mouth daily. 07/03/23   [place], [name], MD  metoprolol  succinate (TOPROL -XL) 100 MG 24 hr tablet TAKE 1 TABLET BY MOUTH  EVERY DAY WITH OR IMMEDIATELY FOLLOWING A MEAL 02/07/23   Wonda Sharper, MD  nitroGLYCERIN  (NITROSTAT ) 0.4 MG SL tablet Q 5 min x 3 in 24 hours as needed. 04/29/24   [place], [name], MD  pantoprazole  (PROTONIX ) 40 MG tablet TAKE 1 TABLET BY MOUTH EVERY DAY 12/30/22   [place], [name], MD  rosuvastatin  (CRESTOR ) 40 MG tablet Take 1 tablet (40 mg total) by mouth daily. 05/22/24   [place], [name], MD  traMADol  (ULTRAM ) 50 MG tablet Take 0.5 tablets (25 mg total) by mouth every 12 (twelve) hours as needed. 03/19/24   [place], [name], MD  triamcinolone  cream (KENALOG ) 0.1 % Apply topically daily as needed (for leg dermatitis.). 03/19/24   [place], [name], MD    Family History Family History  Adopted: Yes  Problem Relation Age of Onset   Colon cancer Mother 81   Cancer Mother        colon   Depression Mother        Suicide.   Early death Mother    Alzheimer's disease Maternal Uncle    Diabetes Daughter        type 2   Other Other        patient is adopted   Stomach cancer Neg Hx     Social History Social History   Tobacco Use   Smoking status: Former    Current packs/day: 0.00    Average packs/day: 2.0 packs/day for 52.0 years (104.0 ttl pk-yrs)    Types: Cigarettes    Start date: 1966    Quit date: 2018    Years since quitting: 7.6   Smokeless tobacco: Never   Tobacco comments:    completely quit May of 2018; period of years she did not smoke   Vaping Use   Vaping status: Never Used  Substance Use Topics   Alcohol use: Yes    Comment: seldom   Drug use: No     Allergies   Ak-mycin [erythromycin] and Sulfamethoxazole   Review  of Systems Review of Systems  Constitutional:  Negative for chills and fever.  HENT:  Negative for ear pain and sore throat.   Eyes:  Negative for pain and visual disturbance.  Respiratory:  Negative for cough and shortness of breath.   Cardiovascular:  Negative for chest pain and palpitations.  Gastrointestinal:  Negative for abdominal pain and  vomiting.  Genitourinary:  Negative for dysuria and hematuria.  Musculoskeletal:  Negative for arthralgias and back pain.  Skin:  Positive for color change and wound. Negative for rash.  Neurological:  Negative for seizures and syncope.  All other systems reviewed and are negative.    Physical Exam Triage Vital Signs ED Triage Vitals [06/16/24 1451]  Encounter Vitals Group     BP 135/80     Girls Systolic BP Percentile      Girls Diastolic BP Percentile      Boys Systolic BP Percentile      Boys Diastolic BP Percentile      Pulse Rate 81     Resp 18     Temp 98.3 F (36.8 C)     Temp Source Oral     SpO2 96 %     Weight      Height      Head Circumference      Peak Flow      Pain Score 7     Pain Loc      Pain Education      Exclude from Growth Chart    No data found.  Updated Vital Signs BP 135/80 (BP Location: Right Arm)   Pulse 81   Temp 98.3 F (36.8 C) (Oral)   Resp 18   SpO2 96%   Visual Acuity Right Eye Distance:   Left Eye Distance:   Bilateral Distance:    Right Eye Near:   Left Eye Near:    Bilateral Near:     Physical Exam Vitals and nursing note reviewed.  Constitutional:      General: She is not in acute distress.    Appearance: She is well-developed.  HENT:     Head: Normocephalic and atraumatic.  Eyes:     Conjunctiva/sclera: Conjunctivae normal.  Cardiovascular:     Rate and Rhythm: Normal rate and regular rhythm.     Heart sounds: No murmur heard. Pulmonary:     Effort: Pulmonary effort is normal. No respiratory distress.     Breath sounds: Normal breath sounds.  Abdominal:     Palpations: Abdomen is soft.     Tenderness: There is no abdominal tenderness.  Musculoskeletal:        General: No swelling.     Cervical back: Neck supple.  Skin:    General: Skin is warm and dry.     Capillary Refill: Capillary refill takes less than 2 seconds.     Comments: See media for wound, small nonviable flap present.  Surrounding  erythremia.  2+ pitting edema secondary to chronic lymphedema.  Neurological:     Mental Status: She is alert.  Psychiatric:        Mood and Affect: Mood normal.       UC Treatments / Results  Labs (all labs ordered are listed, but only abnormal results are displayed) Labs Reviewed - No data to display  EKG   Radiology No results found.  Procedures Procedures (including critical care time)  Medications Ordered in UC Medications  Tdap (BOOSTRIX ) injection 0.5 mL (has no administration in time range)  Initial Impression / Assessment and Plan / UC Course  I have reviewed the triage vital signs and the nursing notes.  Pertinent labs & imaging results that were available during my care of the patient were reviewed by me and considered in my medical decision making (see chart for details).     Non-pressure chronic ulcer of left calf with fat layer exposed (HCC)  Cellulitis of leg, left   Symptoms and physical exam findings are consistent with cellulitis of the left lower extremity secondary to an open wound from a car door injury.  We will update the tetanus today.  We will start antibiotics by mouth as well as topical antibiotics.  Elevate the leg to help with the swelling due to the chronic lymphedema in this lower extremity.  We will treat with the following: Tetanus updated today Doxycycline  100 mg twice daily for 10 days. Take this with food.  Mupirocin  ointment twice daily to the affected area until healed. Change dressing 1-2 times daily and apply mupirocin  to the area then cover with a dry dressing.  May use an Ace wrap to help secure the dressing if needed May wash the wound with soap and water  Elevate the leg to help with the swelling Return to urgent care or PCP if symptoms worsen or fail to resolve.    Final Clinical Impressions(s) / UC Diagnoses   Final diagnoses:  Non-pressure chronic ulcer of left calf with fat layer exposed (HCC)  Cellulitis of leg,  left     Discharge Instructions      Symptoms and physical exam findings are consistent with cellulitis of the left lower extremity secondary to an open wound from a car door injury.  We will update the tetanus today.  We will start antibiotics by mouth as well as topical antibiotics.  Elevate the leg to help with the swelling due to the chronic lymphedema in this lower extremity.  We will treat with the following: Tetanus updated today Doxycycline  100 mg twice daily for 10 days. Take this with food.  Mupirocin  ointment twice daily to the affected area until healed. Change dressing 1-2 times daily and apply mupirocin  to the area then cover with a dry dressing.  May use an Ace wrap to help secure the dressing if needed May wash the wound with soap and water  Elevate the leg to help with the swelling Return to urgent care or PCP if symptoms worsen or fail to resolve.       ED Prescriptions     Medication Sig Dispense Auth. Provider   mupirocin  ointment (BACTROBAN ) 2 % Apply 1 Application topically 2 (two) times daily. 22 g Joei Frangos A, PA-C   doxycycline  (VIBRAMYCIN ) 100 MG capsule Take 1 capsule (100 mg total) by mouth 2 (two) times daily. 20 capsule Teresa Almarie LABOR, [place]      PDMP not reviewed this encounter.   Teresa Almarie LABOR, [place] 06/16/24 1512

## 2024-06-16 NOTE — Discharge Instructions (Addendum)
 Symptoms and physical exam findings are consistent with cellulitis of the left lower extremity secondary to an open wound from a car door injury.  We will update the tetanus today.  We will start antibiotics by mouth as well as topical antibiotics.  Elevate the leg to help with the swelling due to the chronic lymphedema in this lower extremity.  We will treat with the following: Tetanus updated today Doxycycline  100 mg twice daily for 10 days. Take this with food.  Mupirocin  ointment twice daily to the affected area until healed. Change dressing 1-2 times daily and apply mupirocin  to the area then cover with a dry dressing.  May use an Ace wrap to help secure the dressing if needed May wash the wound with soap and water  Elevate the leg to help with the swelling Return to urgent care or PCP if symptoms worsen or fail to resolve.

## 2024-06-21 ENCOUNTER — Telehealth: Payer: Self-pay | Admitting: Cardiovascular Disease

## 2024-06-21 MED ORDER — METOPROLOL SUCCINATE ER 100 MG PO TB24: 100.0000 mg | ORAL_TABLET | Freq: Every day | ORAL | 0 refills | Status: DC

## 2024-06-21 NOTE — Telephone Encounter (Signed)
 Pt's medication was sent to pt's pharmacy as requested. Confirmation received.

## 2024-06-21 NOTE — Telephone Encounter (Signed)
*  STAT* If patient is at the pharmacy, call can be transferred to refill team.   1. Which medications need to be refilled? (please list name of each medication and dose if known) metoprolol  succinate (TOPROL -XL) 100 MG 24 hr tablet    2. Would you like to learn more about the convenience, safety, & potential cost savings by using the [facility]?     3. Are you open to using the [facility] (Type [facility].  ).   4. Which pharmacy/location (including street and city if local pharmacy) is medication to be sent to? [id number] [place], [place] - [address]    5. Do they need a 30 day or 90 day supply? 90 day

## 2024-06-27 ENCOUNTER — Encounter: Payer: Self-pay | Admitting: Nurse Practitioner

## 2024-06-27 ENCOUNTER — Ambulatory Visit (INDEPENDENT_AMBULATORY_CARE_PROVIDER_SITE_OTHER): Admitting: Nurse Practitioner

## 2024-06-27 ENCOUNTER — Other Ambulatory Visit (INDEPENDENT_AMBULATORY_CARE_PROVIDER_SITE_OTHER)

## 2024-06-27 VITALS — BP 104/64 | HR 99 | Ht 65.5 in | Wt 208.4 lb

## 2024-06-27 DIAGNOSIS — Z860101 Personal history of adenomatous and serrated colon polyps: Secondary | ICD-10-CM | POA: Diagnosis not present

## 2024-06-27 DIAGNOSIS — K219 Gastro-esophageal reflux disease without esophagitis: Secondary | ICD-10-CM

## 2024-06-27 DIAGNOSIS — K5900 Constipation, unspecified: Secondary | ICD-10-CM

## 2024-06-27 DIAGNOSIS — Z860102 Personal history of hyperplastic colon polyps: Secondary | ICD-10-CM

## 2024-06-27 DIAGNOSIS — K625 Hemorrhage of anus and rectum: Secondary | ICD-10-CM

## 2024-06-27 DIAGNOSIS — R1030 Lower abdominal pain, unspecified: Secondary | ICD-10-CM

## 2024-06-27 DIAGNOSIS — R195 Other fecal abnormalities: Secondary | ICD-10-CM | POA: Diagnosis not present

## 2024-06-27 LAB — BASIC METABOLIC PANEL WITH GFR
BUN: 60 mg/dL — ABNORMAL HIGH (ref 6–23)
CO2: 22 meq/L (ref 19–32)
Calcium: 9.2 mg/dL (ref 8.4–10.5)
Chloride: 105 meq/L (ref 96–112)
Creatinine, Ser: 1.82 mg/dL — ABNORMAL HIGH (ref 0.40–1.20)
GFR: 26.73 mL/min — ABNORMAL LOW (ref >60.00)
Glucose, Bld: 98 mg/dL (ref 70–99)
Potassium: 3.7 meq/L (ref 3.5–5.1)
Sodium: 137 meq/L (ref 135–145)

## 2024-06-27 LAB — CBC WITH DIFFERENTIAL/PLATELET
Basophils Absolute: 0.1 K/uL (ref 0.0–0.1)
Basophils Relative: 0.5 % (ref 0.0–3.0)
Eosinophils Absolute: 0.1 K/uL (ref 0.0–0.7)
Eosinophils Relative: 0.9 % (ref 0.0–5.0)
HCT: 40.3 % (ref 36.0–46.0)
Hemoglobin: 13.1 g/dL (ref 12.0–15.0)
Lymphocytes Relative: 28.3 % (ref 12.0–46.0)
Lymphs Abs: 3.5 K/uL (ref 0.7–4.0)
MCHC: 32.4 g/dL (ref 30.0–36.0)
MCV: 89.5 fl (ref 78.0–100.0)
Monocytes Absolute: 1.2 K/uL — ABNORMAL HIGH (ref 0.1–1.0)
Monocytes Relative: 9.7 % (ref 3.0–12.0)
Neutro Abs: 7.5 K/uL (ref 1.4–7.7)
Neutrophils Relative %: 60.6 % (ref 43.0–77.0)
Platelets: 221 K/uL (ref 150.0–400.0)
RBC: 4.5 Mil/uL (ref 3.87–5.11)
RDW: 16.1 % — ABNORMAL HIGH (ref 11.5–15.5)
WBC: 12.4 K/uL — ABNORMAL HIGH (ref 4.0–10.5)

## 2024-06-27 NOTE — Progress Notes (Signed)
 06/27/2024 Tracy [name] [id number] [birth year]   Chief Complaint: Diarrhea, constipation  History of Present Illness: Tracy [name] is a 78 year old female with a past medical history of depression, hypertension, hyperlipidemia, CAD s/p DES 2019 on Plavix , CHF, COPD, breast cancer, GERD and colon polyps. She is known by Dr. Abran. She presents today for further evaluation regarding diarrhea and constipation.  She is accompanied by her husband. She endorsed having several episodes of diarrhea daily about 2 weeks ago and 1 episode was bloody, she cannot recall if the blood was bright or dark red but has not recurred.  She started eating a bland diet including applesauce, pudding, Jell-O, eggs and toast and her diarrhea abated.  She is now feeling constipated. She has intermittent lower abdominal pain since April or May for which she takes Pepto-Bismol intermittently, last took Pepto-Bismol last week.  She intermittently passes loose black stools, last passed a black stool 2 days ago. She was previously on Plavix  but ran out of this prescription and has been off of it for a few weeks.  She contacted her cardiologist and is scheduled for an appointment to restart Plavix . She has nausea for the past week without vomiting. No reflux symptoms or dysphagia. She is no longer taking Pantoprazole  40 mg daily. Her most recent colonoscopy was 07/21/2021 which identified 1 tubular adenomatous and 1 hyperplastic polyp removed from the colon. Diverticulosis in the entire examined colon and internal hemorrhoids were also noted.  No further colon polyp surveillance colonoscopies were recommended due to age.  Mother with history of colon cancer.     Latest Ref Rng & Units 04/29/2024    3:52 PM 11/13/2023    2:45 PM 10/16/2023    2:46 PM  CBC  WBC 4.0 - 10.5 K/uL 8.1  9.8  8.5   Hemoglobin 12.0 - 15.0 g/dL 87.1  87.9  88.4   Hematocrit 36.0 - 46.0 % 39.6  37.4  36.0   Platelets 150.0 - 400.0 K/uL  214.0  270.0  273.0        Latest Ref Rng & Units 05/17/2024   10:36 AM 04/29/2024    3:52 PM 11/13/2023    2:45 PM  CMP  Glucose 70 - 99 mg/dL 878  94  98   BUN 6 - 23 mg/dL 63  49  52   Creatinine 0.40 - 1.20 mg/dL 8.09  8.06  8.48   Sodium 135 - 145 mEq/L 138  137  138   Potassium 3.5 - 5.1 mEq/L 4.2  4.1  4.1   Chloride 96 - 112 mEq/L 102  102  105   CO2 19 - 32 mEq/L 26  27  27    Calcium  8.4 - 10.5 mg/dL 9.2  9.3  9.2     GI PROCEDURES:  Colonoscopy 07/21/2021: - Two 2 to 3 mm polyps in the transverse colon and in the ascending colon, removed with a cold snare. Resected and retrieved.  - Diverticulosis in the entire examined colon.  - Internal hemorrhoids.  - The examination was ot - No further surveillance colonoscopies recommended due to age  Surgical [P], colon, transverse and ascending, polyp (2) TUBULAR ADENOMA AND A HYPERPLASTIC POLYP. NEGATIVE FOR HIGH-GRADE DYSPLASIA.  Past Medical History:  Diagnosis Date   Allergic rhinitis    Allergy    Anxiety    Arthritis    Ascending aorta dilation (HCC) 09/13/2022   Echo 09/12/2022: EF 50-55, GR 1 DD, mildly reduced RVSF, normal PASP (  RVSP 17.7), mild dilation of ascending aorta (38 mm), RAP 3   Breast cancer (HCC) 04/28/2020   rigtht breast   Bronchitis    CAD (coronary artery disease)    1/19 PCI/DES to pLCX for ISR, normal EF.    Cataract    Cervical dysplasia    unsure of procedure, possible burning in her late 22s   CHF (congestive heart failure) (HCC)    Echo 06/2019: EF 55-60, elevated LVEDP, normal RV SF, mild MAC, mild MR, trivial TR   Complication of anesthesia    COPD (chronic obstructive pulmonary disease) (HCC)    early    Depression    GERD (gastroesophageal reflux disease)    Gout    Heart murmur    Hyperlipidemia    Hypertension    Low back pain    Myocardial infarction (HCC)    Overactive bladder    PONV (postoperative nausea and vomiting)    Sleep apnea    Past Surgical History:   Procedure Laterality Date   ABDOMINAL HYSTERECTOMY     Partial   ANGIOPLASTY     stent 2007   BREAST EXCISIONAL BIOPSY Right 04/2020   ADH/LCIS   BREAST LUMPECTOMY WITH RADIOACTIVE SEED LOCALIZATION Right 04/28/2020   Procedure: RIGHT BREAST LUMPECTOMY X 2  WITH RADIOACTIVE SEED LOCALIZATION;  Surgeon: Belinda Cough, MD;  Location: [facility];  Service: General;  Laterality: Right;  LMA   BREAST SURGERY     CARDIAC CATHETERIZATION     COLONOSCOPY     CORONARY STENT INTERVENTION N/A 11/06/2017   Procedure: CORONARY STENT INTERVENTION;  Surgeon: Mady Bruckner, MD;  Location: [facility];  Service: Cardiovascular;  Laterality: N/A;   EYE SURGERY     LEFT HEART CATH AND CORONARY ANGIOGRAPHY N/A 11/06/2017   Procedure: LEFT HEART CATH AND CORONARY ANGIOGRAPHY;  Surgeon: Mady Bruckner, MD;  Location: [facility];  Service: Cardiovascular;  Laterality: N/A;   LEFT HEART CATH AND CORONARY ANGIOGRAPHY N/A 07/24/2019   Procedure: LEFT HEART CATH AND CORONARY ANGIOGRAPHY;  Surgeon: Verlin Bruckner BIRCH, MD;  Location: [facility];  Service: Cardiovascular;  Laterality: N/A;   RIGHT/LEFT HEART CATH AND CORONARY ANGIOGRAPHY N/A 09/29/2020   Procedure: RIGHT/LEFT HEART CATH AND CORONARY ANGIOGRAPHY;  Surgeon: Claudene Victory ORN, MD;  Location: [facility];  Service: Cardiovascular;  Laterality: N/A;   ROBOTIC ASSISTED BILATERAL SALPINGO OOPHERECTOMY Bilateral 11/10/2020   Procedure: XI ROBOTIC ASSISTED BILATERAL SALPINGO OOPHORECTOMY WITH MINI LAPAROTOMY FOR DRAINAGE;  Surgeon: Viktoria Comer SAUNDERS, MD;  Location: [facility];  Service: Gynecology;  Laterality: Bilateral;  MINI LAP FIRST   TONSILLECTOMY     VULVECTOMY N/A 11/10/2020   Procedure: WIDE EXCISION VULVECTOMY;  Surgeon: Viktoria Comer SAUNDERS, MD;  Location: [facility];  Service: Gynecology;  Laterality: N/A;   Current Outpatient Medications on File Prior to Visit  Medication Sig Dispense Refill    acetaminophen  (TYLENOL ) 650 MG CR tablet Take 1,300 mg by mouth 2 (two) times daily as needed for pain.     albuterol  (VENTOLIN  HFA) 108 (90 Base) MCG/ACT inhaler Inhale 2 puffs into the lungs every 6 (six) hours as needed for wheezing or shortness of breath. 8.5 g 6   allopurinol  (ZYLOPRIM ) 100 MG tablet TAKE 1 TABLET BY MOUTH EVERY DAY 90 tablet 1   chlorthalidone  (HYGROTON ) 25 MG tablet TAKE 1 TABLET (25 MG TOTAL) BY MOUTH DAILY. 90 tablet 2   estradiol  (ESTRACE  VAGINAL) 0.1 MG/GM vaginal cream Apply a  pea sized amount just inside the vagina nightly for 2 weeks, then twice weekly thereafter (Patient taking differently: Place 1 Applicatorful vaginally at bedtime. Apply a pea sized amount just inside the vagina nightly for 2 weeks, then twice weekly thereafter) 42.5 g 12   ezetimibe  (ZETIA ) 10 MG tablet TAKE 1 TABLET BY MOUTH DAILY 30 tablet 0   furosemide  (LASIX ) 20 MG tablet TAKE 1 TABLET BY MOUTH EVERY DAY AS NEEDED 90 tablet 2   isosorbide  mononitrate (IMDUR ) 30 MG 24 hr tablet TAKE 1 TABLET BY MOUTH EVERY DAY 90 tablet 3   losartan  (COZAAR ) 100 MG tablet Take 1 tablet (100 mg total) by mouth daily. 90 tablet 3   metoprolol  succinate (TOPROL -XL) 100 MG 24 hr tablet Take 1 tablet (100 mg total) by mouth daily. Take with or immediately following a meal. 90 tablet 0   mupirocin  ointment (BACTROBAN ) 2 % Apply 1 Application topically 2 (two) times daily. 22 g 1   nitroGLYCERIN  (NITROSTAT ) 0.4 MG SL tablet Q 5 min x 3 in 24 hours as needed. 25 tablet 6   rosuvastatin  (CRESTOR ) 40 MG tablet Take 1 tablet (40 mg total) by mouth daily. 90 tablet 3   traMADol  (ULTRAM ) 50 MG tablet Take 0.5 tablets (25 mg total) by mouth every 12 (twelve) hours as needed. 30 tablet 2   triamcinolone  cream (KENALOG ) 0.1 % Apply topically daily as needed (for leg dermatitis.). 45 g 1   clopidogrel  (PLAVIX ) 75 MG tablet TAKE 1 TABLET BY MOUTH EVERY DAY WITH BREAKFAST (Patient not taking: Reported on 06/27/2024) 90 tablet 2    pantoprazole  (PROTONIX ) 40 MG tablet TAKE 1 TABLET BY MOUTH EVERY DAY (Patient not taking: Reported on 06/27/2024) 90 tablet 2   No current facility-administered medications on file prior to visit.   Allergies  Allergen Reactions   Ak-Mycin [Erythromycin] Rash   Sulfamethoxazole Rash   Current Medications, Allergies, Past Medical History, Past Surgical History, Family History and Social History were reviewed in [employer] record.  Review of Systems:   Constitutional: Negative for fever, sweats, chills or weight loss.  Respiratory: Negative for shortness of breath.   Cardiovascular: Chronic significant left leg lymphedema. No chest pain. Gastrointestinal: See HPI.  Musculoskeletal: Negative for back pain or muscle aches.  Neurological: Negative for dizziness, headaches or paresthesias.   Physical Exam: BP 104/64   Ht 5' 5.5 (1.664 m)   Wt 208 lb 6 oz (94.5 kg)   BMI 34.15 kg/m  General: 78 year old female requires moderate assistance with transition from chair to the exam table in no acute distress. Head: Normocephalic and atraumatic. Eyes: No scleral icterus. Conjunctiva pink . Ears: Normal auditory acuity. Mouth: Dentition intact. No ulcers or lesions.  Lungs: Clear throughout to auscultation. Heart: Regular rate and rhythm, no murmur. Abdomen: Soft, obese abdomen. Nontender and nondistended. No masses or hepatomegaly. Normal bowel sounds x 4 quadrants.  Rectal: Small noninflamed nonbleeding external hemorrhoids.  Rectal vault with a large amount of soft dark brown/greenish stool guaiac negative.Brooke CMA present during exam.  Musculoskeletal: Symmetrical with no gross deformities. Extremities: Left lower extremity with significant edema/lymphedema, right lower extremity with less edema. Neurological: Alert oriented x 4. No focal deficits.  Psychological: Alert and cooperative. Normal mood and affect  Assessment and Recommendations:  78 year old  female with recent diarrhea including 1 episode of bloody diarrhea which abated and she is now having constipation. - MiraLAX  nightly -To consider Dulcolax suppository as needed if no BM in 2 days -  Patient to contact office if bloody diarrhea or obvious red blood per the rectum occurs  Intermittent lower abdominal pain since April or May 2025.  Abdominal exam today without tenderness. - CTAP to rule out diverticulitis if lower abdominal pain recurs - IBgard 1 capsule p.o. twice daily, samples provided  Nausea without vomiting - Recommend eating 3 small snack size meals daily - Monitor nausea for now, will prescribe Zofran  if nausea persists  Intermittent black stools associated with Pepto-Bismol use.  Patient ran out of Plavix  a few weeks ago, no NSAID use.  Rectal exam today showed guaiac negative stool. -CBC and BUN -Patient instructed to stop Pepto-Bismol  GERD, patient denies having any heartburn or dysphagia. She stopped taking Pantoprazole , she cannot recall when she stopped taking it. - Restart pantoprazole  if reflux symptoms recur or if there is concern of upper GI bleeding await CBC and BUN results as ordered above  History of colon polyps.  Colonoscopy 07/2021 identified 2 small adenomatous and hyperplastic polyps removed from the colon.  Mother with history of colon cancer. -No further colon polyp surveillance colonoscopies recommended due to age  CAD s/p DES x 1 in 2019, prescribed Plavix  per cardiology which she stopped taking a few weeks ago after her prescription ran out.  No chest pain or shortness of breath. - Patient to follow-up with cardiology to verify Plavix  instructions

## 2024-06-27 NOTE — Patient Instructions (Addendum)
 Please purchase the following medications over the counter and take as directed: Gas-X -1 tablet by mouth twice daily.   Miralax  - 1 capful dissolved in at least 8 ounces of water  at bedtime as needed.    No Pepto Bismol.   We have given you samples of the following medication to take: Ibgard - 1 pill by mouth twice daily as needed for abdominal pain.   Go to ED if you have severe abdominal pain or large amounts of black stool.   Your provider has requested that you go to the basement level for lab work before leaving today. Press B on the elevator. The lab is located at the first door on the left as you exit the elevator.  Due to recent changes in healthcare laws, you may see the results of your imaging and laboratory studies on MyChart before your provider has had a chance to review them.  We understand that in some cases there may be results that are confusing or concerning to you. Not all laboratory results come back in the same time frame and the provider may be waiting for multiple results in order to interpret others.  Please give us  48 hours in order for your provider to thoroughly review all the results before contacting the office for clarification of your results.   _______________________________________________________  If your blood pressure at your visit was 140/90 or greater, please contact your primary care physician to follow up on this.  _______________________________________________________  If you are age 87 or older, your body mass index should be between 23-30. Your Body mass index is 34.15 kg/m. If this is out of the aforementioned range listed, please consider follow up with your Primary Care Provider.  If you are age 104 or younger, your body mass index should be between 19-25. Your Body mass index is 34.15 kg/m. If this is out of the aformentioned range listed, please consider follow up with your Primary Care Provider.    ________________________________________________________  The [facility] providers would like to encourage you to use MYCHART to communicate with providers for non-urgent requests or questions.  Due to long hold times on the telephone, sending your provider a message by [facility] may be a faster and more efficient way to get a response.  Please allow 48 business hours for a response.  Please remember that this is for non-urgent requests.  _______________________________________________________  [facility] is using a team-based approach to care.  Your team is made up of your doctor and two to three APPS. Our APPS (Nurse Practitioners and Physician Assistants) work with your physician to ensure care continuity for you. They are fully qualified to address your health concerns and develop a treatment plan. They communicate directly with your gastroenterologist to care for you. Seeing the Advanced Practice Practitioners on your physician's team can help you by facilitating care more promptly, often allowing for earlier appointments, access to diagnostic testing, procedures, and other specialty referrals.   Thank you for trusting me with your gastrointestinal care!   Elida Shawl, CRNP

## 2024-06-28 ENCOUNTER — Other Ambulatory Visit: Payer: Self-pay | Admitting: Physician Assistant

## 2024-06-28 MED ORDER — ASPIRIN 81 MG PO TBEC: 81.0000 mg | DELAYED_RELEASE_TABLET | Freq: Every day | ORAL | Status: AC

## 2024-06-30 ENCOUNTER — Ambulatory Visit: Payer: Self-pay | Admitting: Nurse Practitioner

## 2024-07-10 ENCOUNTER — Ambulatory Visit (INDEPENDENT_AMBULATORY_CARE_PROVIDER_SITE_OTHER)

## 2024-07-10 DIAGNOSIS — Z23 Encounter for immunization: Secondary | ICD-10-CM | POA: Diagnosis not present

## 2024-07-12 ENCOUNTER — Encounter (HOSPITAL_COMMUNITY): Payer: Self-pay

## 2024-07-12 ENCOUNTER — Telehealth (HOSPITAL_COMMUNITY): Payer: Self-pay

## 2024-07-12 ENCOUNTER — Other Ambulatory Visit: Payer: Self-pay

## 2024-07-12 ENCOUNTER — Ambulatory Visit (HOSPITAL_COMMUNITY)
Admission: RE | Admit: 2024-07-12 | Discharge: 2024-07-12 | Disposition: A | Discharge status: Home / Self Care | Source: Ambulatory Visit | Attending: Family Medicine | Admitting: Family Medicine

## 2024-07-12 VITALS — BP 135/64 | HR 76 | Temp 98.3°F | Resp 20

## 2024-07-12 DIAGNOSIS — R600 Localized edema: Secondary | ICD-10-CM

## 2024-07-12 DIAGNOSIS — L03116 Cellulitis of left lower limb: Secondary | ICD-10-CM

## 2024-07-12 MED ORDER — FLUCONAZOLE 150 MG PO TABS: ORAL_TABLET | ORAL | 0 refills | Status: AC

## 2024-07-12 MED ORDER — DOXYCYCLINE HYCLATE 100 MG PO CAPS: 100.0000 mg | ORAL_CAPSULE | Freq: Two times a day (BID) | ORAL | 0 refills | Status: DC

## 2024-07-12 MED ORDER — FLUCONAZOLE 150 MG PO TABS: ORAL_TABLET | ORAL | 0 refills | Status: DC

## 2024-07-12 NOTE — ED Provider Notes (Signed)
 [facility]    CSN: [id number] Arrival date & time: 07/12/24  1033      History   Chief Complaint Chief Complaint  Patient presents with   Leg Swelling    Entered by patient    HPI Tracy [name] is a 78 y.o. female.   Patient is here for pain in her lower legs x several weeks.  This has been going off/on for a while.  She was seen here 8/31 for an abrasion to her left lower leg.  She was given doxy x 10 days.   It did get better, but then started getting worse again.  She has pain in both legs, but worse to the left leg.   The right leg is itchy, and her left leg is painful and red.  Her feet hurt, having a hard time walking.  No fevers, some chills.  She has chronic lymphedema.  She has seen her pcp and specialist without help.  She is unable to take a water  pull due to cramping in her legs.  She has not seen her pcp for this issue recently.          Past Medical History:  Diagnosis Date   Allergic rhinitis    Allergy    Anxiety    Arthritis    Ascending aorta dilation 09/13/2022   Echo 09/12/2022: EF 50-55, GR 1 DD, mildly reduced RVSF, normal PASP (RVSP 17.7), mild dilation of ascending aorta (38 mm), RAP 3   Breast cancer (HCC) 04/28/2020   rigtht breast   Bronchitis    CAD (coronary artery disease)    1/19 PCI/DES to pLCX for ISR, normal EF.    Cataract    Cervical dysplasia    unsure of procedure, possible burning in her late 85s   CHF (congestive heart failure) (HCC)    Echo 06/2019: EF 55-60, elevated LVEDP, normal RV SF, mild MAC, mild MR, trivial TR   Complication of anesthesia    COPD (chronic obstructive pulmonary disease) (HCC)    early    Depression    GERD (gastroesophageal reflux disease)    Gout    Heart murmur    Hyperlipidemia    Hypertension    Low back pain    Myocardial infarction (HCC)    Overactive bladder    PONV (postoperative nausea and vomiting)    Sleep apnea     Patient Active Problem List    Diagnosis Date Noted   PAD (peripheral artery disease) 04/29/2024   Weakness 10/05/2023   Bronchopneumonia 10/05/2023   Multifocal pneumonia 09/29/2023   Hypokalemia 08/07/2023   Intertrigo 04/18/2023   Venous stasis dermatitis of both lower extremities 11/28/2022   Ascending aorta dilation 09/13/2022   Dizziness 01/19/2022   Frequent falls 01/19/2022   Tremor of both hands 08/16/2021   Aortic atherosclerosis 06/15/2021   Generalized osteoarthritis of multiple sites 12/25/2020   Chronic pain disorder 12/25/2020   Pelvic mass in female    Bilateral lower extremity edema 04/13/2020   Shortness of breath    Anxiety disorder 10/29/2018   CKD (chronic kidney disease), stage III (HCC) 06/27/2018   COPD (chronic obstructive pulmonary disease) (HCC) 05/25/2018   Obesity 03/20/2018   Dyspnea on exertion 11/06/2017   Abnormal stress test 11/06/2017   Chronic heart failure with preserved ejection fraction (HCC) 09/04/2017   Left ventricular dysfunction 07/31/2017   Cataract, nuclear sclerotic senile, bilateral 02/01/2017   Gouty arthritis of toe of left foot 08/09/2016   Hyperuricemia  08/09/2016   Osteoarthritis (arthritis due to wear and tear of joints) 01/14/2014   Atherosclerosis of native coronary artery of native heart with angina pectoris 08/02/2011   Mitral regurgitation 08/02/2011   OVERACTIVE BLADDER 07/02/2010   ACUTE CYSTITIS 05/25/2010   Mixed hyperlipidemia 08/11/2009   Leg edema, left 07/22/2009   GERD 04/11/2008   Constipation 04/11/2008   Essential hypertension 05/30/2007   MYOCARDIAL INFARCTION, HX OF 05/30/2007   Allergic rhinitis 05/30/2007   LOW BACK PAIN 05/30/2007    Past Surgical History:  Procedure Laterality Date   ABDOMINAL HYSTERECTOMY     Partial   ANGIOPLASTY     stent 2007   BREAST EXCISIONAL BIOPSY Right 04/2020   ADH/LCIS   BREAST LUMPECTOMY WITH RADIOACTIVE SEED LOCALIZATION Right 04/28/2020   Procedure: RIGHT BREAST LUMPECTOMY X 2  WITH  RADIOACTIVE SEED LOCALIZATION;  Surgeon: Belinda Cough, MD;  Location: [facility];  Service: General;  Laterality: Right;  LMA   BREAST SURGERY     CARDIAC CATHETERIZATION     COLONOSCOPY     CORONARY STENT INTERVENTION N/A 11/06/2017   Procedure: CORONARY STENT INTERVENTION;  Surgeon: Mady Bruckner, MD;  Location: [facility];  Service: Cardiovascular;  Laterality: N/A;   EYE SURGERY     LEFT HEART CATH AND CORONARY ANGIOGRAPHY N/A 11/06/2017   Procedure: LEFT HEART CATH AND CORONARY ANGIOGRAPHY;  Surgeon: Mady Bruckner, MD;  Location: [facility];  Service: Cardiovascular;  Laterality: N/A;   LEFT HEART CATH AND CORONARY ANGIOGRAPHY N/A 07/24/2019   Procedure: LEFT HEART CATH AND CORONARY ANGIOGRAPHY;  Surgeon: Verlin Bruckner BIRCH, MD;  Location: [facility];  Service: Cardiovascular;  Laterality: N/A;   RIGHT/LEFT HEART CATH AND CORONARY ANGIOGRAPHY N/A 09/29/2020   Procedure: RIGHT/LEFT HEART CATH AND CORONARY ANGIOGRAPHY;  Surgeon: Claudene Victory ORN, MD;  Location: [facility];  Service: Cardiovascular;  Laterality: N/A;   ROBOTIC ASSISTED BILATERAL SALPINGO OOPHERECTOMY Bilateral 11/10/2020   Procedure: XI ROBOTIC ASSISTED BILATERAL SALPINGO OOPHORECTOMY WITH MINI LAPAROTOMY FOR DRAINAGE;  Surgeon: Viktoria Comer SAUNDERS, MD;  Location: [facility];  Service: Gynecology;  Laterality: Bilateral;  MINI LAP FIRST   TONSILLECTOMY     VULVECTOMY N/A 11/10/2020   Procedure: WIDE EXCISION VULVECTOMY;  Surgeon: Viktoria Comer SAUNDERS, MD;  Location: [facility];  Service: Gynecology;  Laterality: N/A;    OB History     Gravida  2   Para  2   Term      Preterm      AB      Living  2      SAB      IAB      Ectopic      Multiple      Live Births  2            Home Medications    Prior to Admission medications   Medication Sig Start Date End Date Taking? Authorizing Provider  acetaminophen  (TYLENOL ) 650 MG CR tablet Take 1,300 mg by  mouth 2 (two) times daily as needed for pain.   Yes [provider]  albuterol  (VENTOLIN  HFA) 108 (90 Base) MCG/ACT inhaler Inhale 2 puffs into the lungs every 6 (six) hours as needed for wheezing or shortness of breath. 06/10/24  Yes Charley Conger, PA-C  allopurinol  (ZYLOPRIM ) 100 MG tablet TAKE 1 TABLET BY MOUTH EVERY DAY 03/01/24  Yes [place], [name], MD  aspirin  EC 81 MG tablet Take 1 tablet (81 mg total) by  mouth daily. Swallow whole. 06/28/24 06/28/25 Yes Weaver, Scott T, PA-C  losartan  (COZAAR ) 100 MG tablet Take 1 tablet (100 mg total) by mouth daily. 07/03/23  Yes [place], [name], MD  metoprolol  succinate (TOPROL -XL) 100 MG 24 hr tablet Take 1 tablet (100 mg total) by mouth daily. Take with or immediately following a meal. 06/21/24  Yes Wonda Sharper, MD  traMADol  (ULTRAM ) 50 MG tablet Take 0.5 tablets (25 mg total) by mouth every 12 (twelve) hours as needed. 03/19/24  Yes [place], [name], MD  chlorthalidone  (HYGROTON ) 25 MG tablet TAKE 1 TABLET (25 MG TOTAL) BY MOUTH DAILY. 02/09/24   [place], [name], MD  estradiol  (ESTRACE  VAGINAL) 0.1 MG/GM vaginal cream Apply a pea sized amount just inside the vagina nightly for 2 weeks, then twice weekly thereafter Patient taking differently: Place 1 Applicatorful vaginally at bedtime. Apply a pea sized amount just inside the vagina nightly for 2 weeks, then twice weekly thereafter 09/12/23   Erik Kieth BROCKS, MD  ezetimibe  (ZETIA ) 10 MG tablet TAKE 1 TABLET BY MOUTH DAILY 06/12/24   Wonda Sharper, MD  furosemide  (LASIX ) 20 MG tablet TAKE 1 TABLET BY MOUTH EVERY DAY AS NEEDED 11/22/23   [place], [name], MD  isosorbide  mononitrate (IMDUR ) 30 MG 24 hr tablet TAKE 1 TABLET BY MOUTH EVERY DAY 04/10/23   Lelon Hamilton T, PA-C  mupirocin  ointment (BACTROBAN ) 2 % Apply 1 Application topically 2 (two) times daily. 06/16/24   White, Elizabeth A, PA-C  nitroGLYCERIN  (NITROSTAT ) 0.4 MG SL tablet Q 5 min x 3 in 24 hours as needed. 04/29/24   [place], [name], MD   pantoprazole  (PROTONIX ) 40 MG tablet TAKE 1 TABLET BY MOUTH EVERY DAY Patient not taking: Reported on 06/27/2024 12/30/22   [place], [name], MD  rosuvastatin  (CRESTOR ) 40 MG tablet Take 1 tablet (40 mg total) by mouth daily. 05/22/24   [place], [name], MD  triamcinolone  cream (KENALOG ) 0.1 % Apply topically daily as needed (for leg dermatitis.). 03/19/24   [place], [name], MD    Family History Family History  Adopted: Yes  Problem Relation Age of Onset   Colon cancer Mother 25   Cancer Mother        colon   Depression Mother        Suicide.   Early death Mother    Alzheimer's disease Maternal Uncle    Diabetes Daughter        type 2   Other Other        patient is adopted   Stomach cancer Neg Hx     Social History Social History   Tobacco Use   Smoking status: Former    Current packs/day: 0.00    Average packs/day: 2.0 packs/day for 52.0 years (104.0 ttl pk-yrs)    Types: Cigarettes    Start date: 1966    Quit date: 2018    Years since quitting: 7.7   Smokeless tobacco: Never   Tobacco comments:    completely quit May of 2018; period of years she did not smoke   Vaping Use   Vaping status: Never Used  Substance Use Topics   Alcohol use: Not Currently    Comment: seldom   Drug use: No     Allergies   Ak-mycin [erythromycin] and Sulfamethoxazole   Review of Systems Review of Systems  Constitutional: Negative.   HENT: Negative.    Respiratory: Negative.    Cardiovascular: Negative.   Gastrointestinal: Negative.   Musculoskeletal: Negative.  Skin:  Positive for wound.     Physical Exam Triage Vital Signs ED Triage Vitals  Encounter Vitals Group     BP 07/12/24 1106 135/64     Girls Systolic BP Percentile --      Girls Diastolic BP Percentile --      Boys Systolic BP Percentile --      Boys Diastolic BP Percentile --      Pulse Rate 07/12/24 1106 76     Resp 07/12/24 1106 20     Temp 07/12/24 1106 98.3 F (36.8 C)     Temp src --      SpO2 07/12/24  1106 94 %     Weight --      Height --      Head Circumference --      Peak Flow --      Pain Score 07/12/24 1059 8     Pain Loc --      Pain Education --      Exclude from Growth Chart --    No data found.  Updated Vital Signs BP 135/64   Pulse 76   Temp 98.3 F (36.8 C)   Resp 20   SpO2 94%   Visual Acuity Right Eye Distance:   Left Eye Distance:   Bilateral Distance:    Right Eye Near:   Left Eye Near:    Bilateral Near:     Physical Exam Constitutional:      Appearance: Normal appearance. She is normal weight.  Cardiovascular:     Rate and Rhythm: Normal rate and regular rhythm.  Pulmonary:     Effort: Pulmonary effort is normal.  Skin:    Comments: Chronic lymphedema present bilaterally;   The right LE is swollen, no redness/warmth noted;  The left LE is swollen, red, warm and tender;  there is a healed wound to the left lateral lower leg;  no open sores or weeping is noted  Neurological:     General: No focal deficit present.     Mental Status: She is alert.      UC Treatments / Results  Labs (all labs ordered are listed, but only abnormal results are displayed) Labs Reviewed - No data to display  EKG   Radiology No results found.  Procedures Procedures (including critical care time)  Medications Ordered in UC Medications - No data to display  Initial Impression / Assessment and Plan / UC Course  I have reviewed the triage vital signs and the nursing notes.  Pertinent labs & imaging results that were available during my care of the patient were reviewed by me and considered in my medical decision making (see chart for details).    Final Clinical Impressions(s) / UC Diagnoses   Final diagnoses:  Peripheral edema  Cellulitis of left lower extremity     Discharge Instructions      You were seen today for lower extremity pain.  You have an infection of your left lower leg.  I have sent out an oral antibiotic to take for 10 days.   Please continue to keep the legs elevated as much as possible.  When the infection clears, please try to wear your compression stockings as much as possible.  If this is not improving, then go to the Emergency Room for further evaluation.  Follow up with your primary care provider for long term discussion of this issue.     ED Prescriptions     Medication Sig Dispense Auth.  Provider   doxycycline  (VIBRAMYCIN ) 100 MG capsule Take 1 capsule (100 mg total) by mouth 2 (two) times daily. 20 capsule Pairlee Sawtell, MD   fluconazole  (DIFLUCAN ) 150 MG tablet 1 tab po x 1, repeat in 3 days 2 tablet Darral Longs, MD      PDMP not reviewed this encounter.   Darral Longs, MD 07/12/24 [phone]

## 2024-07-12 NOTE — Discharge Instructions (Signed)
 You were seen today for lower extremity pain.  You have an infection of your left lower leg.  I have sent out an oral antibiotic to take for 10 days.  Please continue to keep the legs elevated as much as possible.  When the infection clears, please try to wear your compression stockings as much as possible.  If this is not improving, then go to the Emergency Room for further evaluation.  Follow up with your primary care provider for long term discussion of this issue.

## 2024-07-12 NOTE — ED Triage Notes (Signed)
 PT reports her legs are burning real bad . Skin to lower legs are red and swollen . Pt reports Sx's have been there this time for 2 weeks.

## 2024-07-12 NOTE — Telephone Encounter (Signed)
 Pt called reporting Diflucan  was not at pharmacy. Called pharmacy who reports they only received Doxy rx. States they have had system issues. Diflucan  reordered.

## 2024-07-15 ENCOUNTER — Other Ambulatory Visit: Payer: Self-pay | Admitting: Cardiovascular Disease

## 2024-07-15 NOTE — Progress Notes (Signed)
 Pt follows with nephrologist. BJ

## 2024-07-24 ENCOUNTER — Ambulatory Visit: Admitting: Podiatry

## 2024-07-24 DIAGNOSIS — I5030 Unspecified diastolic (congestive) heart failure: Secondary | ICD-10-CM | POA: Diagnosis not present

## 2024-07-24 DIAGNOSIS — N184 Chronic kidney disease, stage 4 (severe): Secondary | ICD-10-CM | POA: Diagnosis not present

## 2024-07-24 DIAGNOSIS — I872 Venous insufficiency (chronic) (peripheral): Secondary | ICD-10-CM | POA: Diagnosis not present

## 2024-07-24 DIAGNOSIS — I129 Hypertensive chronic kidney disease with stage 1 through stage 4 chronic kidney disease, or unspecified chronic kidney disease: Secondary | ICD-10-CM | POA: Diagnosis not present

## 2024-08-07 ENCOUNTER — Ambulatory Visit: Admitting: Physician Assistant

## 2024-08-12 ENCOUNTER — Other Ambulatory Visit: Payer: Self-pay | Admitting: Family Medicine

## 2024-08-16 ENCOUNTER — Other Ambulatory Visit: Payer: Self-pay | Admitting: Family Medicine

## 2024-08-16 DIAGNOSIS — M109 Gout, unspecified: Secondary | ICD-10-CM

## 2024-08-20 ENCOUNTER — Telehealth: Payer: Self-pay | Admitting: Family Medicine

## 2024-08-20 ENCOUNTER — Other Ambulatory Visit: Payer: Self-pay | Admitting: Physician Assistant

## 2024-08-20 NOTE — Telephone Encounter (Unsigned)
 Copied from CRM #[id number]. Topic: Clinical - Refused Triage >> Aug 20, 2024 10:19 AM Tinnie C wrote: Patient/caller voiced complaints of neuropathy and open sores on ankles. Declined transfer to triage.

## 2024-08-20 NOTE — Telephone Encounter (Signed)
 Appt scheduled for tomorrow with Dr. Jordan at 1400.

## 2024-08-21 ENCOUNTER — Ambulatory Visit (INDEPENDENT_AMBULATORY_CARE_PROVIDER_SITE_OTHER): Admitting: Family Medicine

## 2024-08-21 VITALS — BP 110/70 | HR 100 | Temp 97.9°F | Resp 16 | Ht 65.5 in | Wt 212.8 lb

## 2024-08-21 DIAGNOSIS — I739 Peripheral vascular disease, unspecified: Secondary | ICD-10-CM

## 2024-08-21 DIAGNOSIS — R2689 Other abnormalities of gait and mobility: Secondary | ICD-10-CM

## 2024-08-21 DIAGNOSIS — M79662 Pain in left lower leg: Secondary | ICD-10-CM | POA: Diagnosis not present

## 2024-08-21 DIAGNOSIS — I872 Venous insufficiency (chronic) (peripheral): Secondary | ICD-10-CM | POA: Diagnosis not present

## 2024-08-21 MED ORDER — FUROSEMIDE 20 MG PO TABS: 20.0000 mg | ORAL_TABLET | Freq: Every day | ORAL | 2 refills | Status: DC | PRN

## 2024-08-21 MED ORDER — DOXYCYCLINE HYCLATE 100 MG PO TABS: 100.0000 mg | ORAL_TABLET | Freq: Two times a day (BID) | ORAL | 0 refills | Status: AC

## 2024-08-21 MED ORDER — ALUM SULFATE-CA ACETATE EX PACK
1.0000 | PACK | Freq: Three times a day (TID) | CUTANEOUS | 12 refills | Status: AC
Start: 2024-08-21 — End: ?

## 2024-08-21 NOTE — Assessment & Plan Note (Signed)
 Mildly abnormal ABI in 03/2024, she received a call with results, it was not decided if she was interested in vascular referral to to proceed with a Doppler US  LE's. Currently she is on Rosuvastatin  40 mg and Zetia  10 mg daily as well as Aspirin  81 mg daily. Vascular referral placed today.

## 2024-08-21 NOTE — Progress Notes (Signed)
 ACUTE VISIT Chief Complaint  Patient presents with   Acute Visit    Venous stasis dermatitis    Discussed the use of AI scribe software for clinical note transcription with the patient, who gave verbal consent to proceed.  History of Present Illness Tracy [name] is a 78 year old female with a PMHx of HTN, HLD, CHF, CAD, COPD, GERD,CKD lll,  and venous stasis dermatitis who presents with worsening leg wounds and swelling. She is accompanied by her husband, Ron.  She has been experiencing worsening leg erythema, small ulcers, swelling, and weeping primarily affecting her left leg, following an injury on June 30, 2024, when she accidentally punctured her left leg with a car door, leading to significant bleeding. Although EMS was called, she did not visit the ER. The wound has been progressively worsening, with increased swelling and heaviness in the leg.  She visited a wound clinic a few months ago where she was provided with a compression wrap, but no follow-up was scheduled. She is hesitant to use the wrap due to the open sore. She has been experiencing chills but has not measured her temperature.  On July 12, 2024, she visited urgent care for peripheral swelling and was prescribed a 10-day course of antibiotics, Doxycycline  and fluconazole . She recalls receiving systemic steroids at some point, which helped. She is currently using triamcinolone  cream for redness and itching, but it has not been effective.   She reports difficulty walking due to the heaviness and pain in her legs, which has impacted her mobility and balance. She has not been taking showers due to fear of falling, as her balance has been deteriorating over the past year or two. She has been using a chair in the bathroom to assist with hygiene but has not assembled it due to the configuration of her older home. Her husband expresses concern about her ability to manage her condition at home.   She has  had falls in the past.  Her mobility issues have hindered her ability to walk daily, feels like PT helped some.  PAD: ABI in June 2025, which showed mild abnormalities. 04/09/24 ABI: Right: Resting right ankle-brachial index indicates mild right lower  extremity arterial disease. The right toe-brachial index is normal.   Left: Resting left ankle-brachial index is within normal range. The left toe-brachial index is abnormal.   LE edema: Requesting refills for Furosemide  20 mg but expresses concern about their side effects, such as cramps.   CKD 4, she follows with nephrologist. She has been advised to follow a low-salt diet, but admits to occasional lapses.  Review of Systems  Constitutional:  Positive for activity change. Negative for appetite change and unexpected weight change.  Respiratory:  Negative for cough, shortness of breath and wheezing.   Cardiovascular:  Negative for chest pain and palpitations.  Gastrointestinal:  Negative for abdominal pain, nausea and vomiting.  Endocrine: Negative for cold intolerance and heat intolerance.  Genitourinary:  Negative for decreased urine volume, dysuria and hematuria.  Musculoskeletal:  Positive for arthralgias, back pain and gait problem.  Neurological:  Negative for syncope and weakness.  See other pertinent positives and negatives in HPI.  Current Outpatient Medications on File Prior to Visit  Medication Sig Dispense Refill   acetaminophen  (TYLENOL ) 650 MG CR tablet Take 1,300 mg by mouth 2 (two) times daily as needed for pain.     albuterol  (VENTOLIN  HFA) 108 (90 Base) MCG/ACT inhaler Inhale 2 puffs into the lungs every 6 (  six) hours as needed for wheezing or shortness of breath. 8.5 g 6   allopurinol  (ZYLOPRIM ) 100 MG tablet TAKE 1 TABLET BY MOUTH EVERY DAY 90 tablet 1   aspirin  EC 81 MG tablet Take 1 tablet (81 mg total) by mouth daily. Swallow whole.     chlorthalidone  (HYGROTON ) 25 MG tablet TAKE 1 TABLET (25 MG TOTAL) BY MOUTH  DAILY. 90 tablet 2   estradiol  (ESTRACE  VAGINAL) 0.1 MG/GM vaginal cream Apply a pea sized amount just inside the vagina nightly for 2 weeks, then twice weekly thereafter (Patient taking differently: Place 1 Applicatorful vaginally at bedtime. Apply a pea sized amount just inside the vagina nightly for 2 weeks, then twice weekly thereafter) 42.5 g 12   ezetimibe  (ZETIA ) 10 MG tablet TAKE 1 TABLET BY MOUTH DAILY 30 tablet 0   fluconazole  (DIFLUCAN ) 150 MG tablet 1 tab po x 1, repeat in 3 days 2 tablet 0   isosorbide  mononitrate (IMDUR ) 30 MG 24 hr tablet TAKE 1 TABLET BY MOUTH EVERY DAY 90 tablet 3   losartan  (COZAAR ) 100 MG tablet TAKE 1 TABLET BY MOUTH EVERY DAY 90 tablet 3   metoprolol  succinate (TOPROL -XL) 100 MG 24 hr tablet Take 1 tablet (100 mg total) by mouth daily. Take with or immediately following a meal. 90 tablet 0   mupirocin  ointment (BACTROBAN ) 2 % Apply 1 Application topically 2 (two) times daily. 22 g 1   nitroGLYCERIN  (NITROSTAT ) 0.4 MG SL tablet Q 5 min x 3 in 24 hours as needed. 25 tablet 6   rosuvastatin  (CRESTOR ) 40 MG tablet Take 1 tablet (40 mg total) by mouth daily. 90 tablet 3   traMADol  (ULTRAM ) 50 MG tablet Take 0.5 tablets (25 mg total) by mouth every 12 (twelve) hours as needed. 30 tablet 2   triamcinolone  cream (KENALOG ) 0.1 % Apply topically daily as needed (for leg dermatitis.). 45 g 1   pantoprazole  (PROTONIX ) 40 MG tablet TAKE 1 TABLET BY MOUTH EVERY DAY (Patient not taking: Reported on 08/21/2024) 90 tablet 2   No current facility-administered medications on file prior to visit.    Past Medical History:  Diagnosis Date   Allergic rhinitis    Allergy    Anxiety    Arthritis    Ascending aorta dilation 09/13/2022   Echo 09/12/2022: EF 50-55, GR 1 DD, mildly reduced RVSF, normal PASP (RVSP 17.7), mild dilation of ascending aorta (38 mm), RAP 3   Breast cancer (HCC) 04/28/2020   rigtht breast   Bronchitis    CAD (coronary artery disease)    1/19 PCI/DES to  pLCX for ISR, normal EF.    Cataract    Cervical dysplasia    unsure of procedure, possible burning in her late 62s   CHF (congestive heart failure) (HCC)    Echo 06/2019: EF 55-60, elevated LVEDP, normal RV SF, mild MAC, mild MR, trivial TR   Complication of anesthesia    COPD (chronic obstructive pulmonary disease) (HCC)    early    Depression    GERD (gastroesophageal reflux disease)    Gout    Heart murmur    Hyperlipidemia    Hypertension    Low back pain    Myocardial infarction (HCC)    Overactive bladder    PONV (postoperative nausea and vomiting)    Sleep apnea    Allergies  Allergen Reactions   Ak-Mycin [Erythromycin] Rash   Sulfamethoxazole Rash    Social History   Socioeconomic History   Marital status:  Married    Spouse name: Not on file   Number of children: 2   Years of education: Not on file   Highest education level: 12th grade  Occupational History   Occupation: retired   Occupation: retired    Comment: production designer, theatre/television/film  Tobacco Use   Smoking status: Former    Current packs/day: 0.00    Average packs/day: 2.0 packs/day for 52.0 years (104.0 ttl pk-yrs)    Types: Cigarettes    Start date: 1966    Quit date: 2018    Years since quitting: 7.8   Smokeless tobacco: Never   Tobacco comments:    completely quit May of 2018; period of years she did not smoke   Vaping Use   Vaping status: Never Used  Substance and Sexual Activity   Alcohol use: Not Currently    Comment: seldom   Drug use: No   Sexual activity: Not Currently  Other Topics Concern   Not on file  Social History Narrative   Not on file   Social Drivers of Health   Financial Resource Strain: Low Risk  (08/21/2024)   Overall Financial Resource Strain (CARDIA)    Difficulty of Paying Living Expenses: Not very hard  Food Insecurity: Food Insecurity Present (08/21/2024)   Hunger Vital Sign    Worried About Running Out of Food in the Last Year: Sometimes true    Ran Out of Food in the  Last Year: Patient declined  Transportation Needs: No Transportation Needs (08/21/2024)   PRAPARE - Administrator, Civil Service (Medical): No    Lack of Transportation (Non-Medical): No  Physical Activity: Inactive (08/21/2024)   Exercise Vital Sign    Days of Exercise per Week: 0 days    Minutes of Exercise per Session: Not on file  Stress: Stress Concern Present (08/21/2024)   [employer] of Occupational Health - Occupational Stress Questionnaire    Feeling of Stress: To some extent  Social Connections: Unknown (08/21/2024)   Social Connection and Isolation Panel    Frequency of Communication with Friends and Family: Patient declined    Frequency of Social Gatherings with Friends and Family: Patient declined    Attends Religious Services: Patient declined    Database Administrator or Organizations: No    Attends Engineer, Structural: Not on file    Marital Status: Married    Vitals:   08/21/24 1357  BP: 110/70  Pulse: 100  Resp: 16  Temp: 97.9 F (36.6 C)  SpO2: 97%   Body mass index is 34.87 kg/m.  Physical Exam Vitals and nursing note reviewed.  Constitutional:      General: She is not in acute distress.    Appearance: She is well-developed.  HENT:     Head: Normocephalic and atraumatic.  Eyes:     Conjunctiva/sclera: Conjunctivae normal.  Cardiovascular:     Rate and Rhythm: Normal rate and regular rhythm.     Heart sounds: No murmur heard.    Comments: DP pulses palpable, difficult to find due to pedal edema. Normal capillary refill.  Left calf increased diameter+ tightness and tenderness with palpation. Pulmonary:     Effort: Pulmonary effort is normal. No respiratory distress.     Breath sounds: Normal breath sounds.  Skin:    General: Skin is warm.     Findings: Erythema present. No rash.     Comments: Distal LE's, specially pretibial, L>>R with erythema, thick crusty areas, and small ulcers with clear drainage (  anterior right  and medial lett). See picture.  Neurological:     General: No focal deficit present.     Mental Status: She is alert and oriented to person, place, and time.     Comments: Unstable gait assisted with a walker.  Psychiatric:        Mood and Affect: Mood and affect normal.     ASSESSMENT AND PLAN:  Ms. Ell was seen today for worsening LE edema and erythema.  Pain of left calf Associated edema, tightness,and pain after recent trauma and increase calf diameter concerning for DVT. Start LE VAS US  will be arranged. Clearly instructed about warning signs.  -     VAS US  LOWER EXTREMITY VENOUS (DVT); Future  PAD (peripheral artery disease) Assessment & Plan: Mildly abnormal ABI in 03/2024, she received a call with results, it was not decided if she was interested in vascular referral to to proceed with a Doppler US  LE's. Currently she is on Rosuvastatin  40 mg and Zetia  10 mg daily as well as Aspirin  81 mg daily. Vascular referral placed today.  Orders: -     Ambulatory referral to Vascular Surgery -     Ambulatory referral to Home Health  Venous stasis dermatitis of both lower extremities Assessment & Plan: Problem has been getting worse. Recently treated with Doxycycline  x 10 d, 07/12/2024. I do not think there is an active infectious process at this time, sent a new Rx to start if is is not greatly better with recommendations given today. Walking a few times per day will help as well as elevation and compression bandage. Continue Triamcinolone  cream daily as needed. Resume Furosemide  20 mg daily. [place] may help with drainage. She was evaluated at the wound clinic and according to pt, follow up was not arranged. Vascular referral placed. Small venous ulcers, wound care through [facility] will be arranged. Keep areas clean with soap and water .  Orders: -     Ambulatory referral to Vascular Surgery -     Alum Sulfate-Ca Acetate; Apply 1 packet topically 3 (three) times daily.   Dispense: 100 each; Refill: 12 -     Furosemide ; Take 1 tablet (20 mg total) by mouth daily as needed.  Dispense: 90 tablet; Refill: 2 -     Ambulatory referral to Home Health  Balance problems She has not been as active lately, so gait/balance have been worse. She uses a walker for transfer, afraid of falling. She needs help with some ADL's. HH referral placed to help with PT,OT, and ADL's. Fall precautions discussed.  -     Ambulatory referral to Home Health  Other orders -     Doxycycline  Hyclate; Take 1 tablet (100 mg total) by mouth 2 (two) times daily for 7 days.  Dispense: 14 tablet; Refill: 0   I personally spent a total of 48 minutes in the care of the patient today including preparing to see the patient, getting/reviewing separately obtained history, performing a medically appropriate exam/evaluation, counseling and educating, placing orders, and documenting clinical information in the EHR.  Return if symptoms worsen or fail to improve, for keep next appointment.  Suhey Radford G. Zaniel Marineau, MD  [facility]. [place] [facility].

## 2024-08-21 NOTE — Assessment & Plan Note (Signed)
 Problem has been getting worse. Recently treated with Doxycycline  x 10 d, mid 06/2024. I do not think there is an active infectious process at this time, sent a new Rx to start if is is not greatly better with recommendations given today. Walking a few times per day will help as well as elevation and compression bandage. Continue Triamcinolone  cream daily as needed. Resume Furosemide  20 mg daily. [place] may help with drainage. Tracy [name] was evaluated at the wound clinic and according to pt, follow up was not arranged. Vascular referral placed. Small venous ulcers, wound care through [facility] will be arranged. Keep areas clean with soap and water .

## 2024-08-21 NOTE — Patient Instructions (Signed)
 A few things to remember from today's visit:  PAD (peripheral artery disease) - Plan: Ambulatory referral to Vascular Surgery, Ambulatory referral to Home Health  Venous stasis dermatitis of both lower extremities - Plan: Ambulatory referral to Vascular Surgery, furosemide  (LASIX ) 20 MG tablet, Ambulatory referral to Home Health  Pain of left calf - Plan: VAS US  LOWER EXTREMITY VENOUS (DVT)  Balance problems - Plan: Ambulatory referral to Home Health  You can hold on antibiotic for now. If you take it, start also a probiotic. Elevation, ambulation as tolerated, compression grap. [place] may help with weeping.  If you need refills for medications you take chronically, please call your pharmacy. Do not use My Chart to request refills or for acute issues that need immediate attention. If you send a my chart message, it may take a few days to be addressed, specially if I am not in the office.  Please be sure medication list is accurate. If a new problem present, please set up appointment sooner than planned today.

## 2024-08-22 ENCOUNTER — Ambulatory Visit (HOSPITAL_COMMUNITY): Admission: RE | Admit: 2024-08-22 | Source: Ambulatory Visit

## 2024-08-22 ENCOUNTER — Ambulatory Visit (HOSPITAL_COMMUNITY)

## 2024-08-22 MED ORDER — EZETIMIBE 10 MG PO TABS: 10.0000 mg | ORAL_TABLET | Freq: Every day | ORAL | 0 refills | Status: DC

## 2024-08-26 ENCOUNTER — Ambulatory Visit (HOSPITAL_COMMUNITY)
Admission: RE | Admit: 2024-08-26 | Discharge: 2024-08-26 | Disposition: A | Discharge status: Home / Self Care | Source: Ambulatory Visit | Attending: Family Medicine | Admitting: Family Medicine

## 2024-08-26 DIAGNOSIS — M79662 Pain in left lower leg: Secondary | ICD-10-CM | POA: Insufficient documentation

## 2024-09-02 ENCOUNTER — Ambulatory Visit: Admitting: Dermatology

## 2024-09-03 ENCOUNTER — Ambulatory Visit: Payer: Self-pay | Admitting: Family Medicine

## 2024-09-16 ENCOUNTER — Other Ambulatory Visit: Payer: Self-pay | Admitting: Cardiovascular Disease

## 2024-09-17 ENCOUNTER — Telehealth: Payer: Self-pay | Admitting: Family Medicine

## 2024-09-17 DIAGNOSIS — R269 Unspecified abnormalities of gait and mobility: Secondary | ICD-10-CM

## 2024-09-17 DIAGNOSIS — Z9181 History of falling: Secondary | ICD-10-CM

## 2024-09-17 NOTE — Telephone Encounter (Signed)
 Copied from CRM #[id number]. Topic: Referral - Request for Referral >> Sep 17, 2024  9:17 AM Mesmerise C wrote: Did the patient discuss referral with their provider in the last year? Yes (If No - schedule appointment) (If Yes - send message) States discussed in house but doesn't want in house  Appointment offered? Yes  Type of order/referral and detailed reason for visit: Rehab at [facility]  Preference of office, provider, location: [address] phone number [phone]   If referral order, have you been seen by this specialty before? Yes (If Yes, this issue or another issue? When? Where? Was seen about a month ago for a specialty rehab   Can we respond through MyChart? Yes

## 2024-09-17 NOTE — Telephone Encounter (Signed)
Spoke with patient and referral placed

## 2024-09-26 ENCOUNTER — Ambulatory Visit: Payer: Self-pay | Admitting: Rehabilitation

## 2024-09-30 NOTE — Assessment & Plan Note (Signed)
 She is intolerant of statins due to myalgias. She has seen Dr. Mona in the [facility]. She has previously declined further medications. She is currently managed with Ezetimibde and Rosuvastatin . She is not fasting today. Goal LDL is at least < 70, ideally < 55.  - Continue rosuvastatin  40 mg daily - Continue Zetia  10 mg daily - Obtain direct LDL and ALT today

## 2024-09-30 NOTE — Progress Notes (Unsigned)
 OFFICE NOTE:    Date:  10/01/2024  ID:  Tracy [name], DOB [birth year], MRN [id number] PCP: Jordan, [name], MD  [facility] Providers Cardiologist:  Ozell Fell, MD Cardiology APP:  Lelon Glendia DASEN, PA-C        Coronary artery disease  S/p BMS to LCx in 2007; S/p DES to LCx in 10/2017 Myoview  07/2019: ant ischemia; high risk >> Cath 07/2019: patent LCx, stable anatomy - Med Rx Cath 09/29/20: oLAD 30, mLAD 60/70, oD1 40, 1st Sept Perf 100 (R-L collats); oLCx 25, pLCx stent ok w 10 ISR, pRCA 25, EF 50-55 >> Med Rx  Heart failure with preserved ejection fraction Echo 07/17/19: EF 55-60, GR 1 DD, NL RVSF, mild MR, trivial TR, NL PASP, RAP 3 TTE 09/12/2022: EF 50-55, GR 1 DD, mildly reduced RVSF, normal PASP, ascending aorta 38 mm, RAP 3 TTE 01/08/24: EF 50, Gr 1 DD, NL RVSF, NL PASP, mild MR Chronic kidney disease stage IIIb Chronic Obstructive Pulmonary Disease  Hypertension  Hyperlipidemia  Intol of statins (myalgias) // Elevated LFTs >> Statin DCd (Lipitor) Eval w [facility] (Dr. Mona) >> +Pravastatin  w improved LDL Carotid artery stenosis             US  04/21/21: bilat ICA 1-39  Peripheral arterial disease ABIs 04/09/24: R 0.93, L 0.97; TBI R 0.72, L 0.49 (abnormal) Hepatic steatosis  Aortic atherosclerosis (CT 11/2020) S/p BSO, mini-lap for drainage of contained cyst (10/2020) Hx of CVA (remote lacunar infarct noted on MRI)        Discussed the use of AI scribe software for clinical note transcription with the patient, who gave verbal consent to proceed. History of Present Illness Tracy [name] is a 78 y.o. female for follow up of CAD, CHF. She was last seen by Dr. Fell in 05/2023. She had stable CCS class II anginal symptoms at that time. She had ABIs in 03/2024 that were normal. However, TBIs were abnormal. Notes from primary care reviewed. Pt was asymptomatic. Her Pravastatin  was changed to Rosuvastatin .   She experiences chest  discomfort described as 'twinges', similar to past experiences, with occasional relief from nitroglycerin . No radiation of the pain is noted. She has not had an escalation in symptoms (i.e. increased frequency, intensity, duration, etc). She experiences shortness of breath and notes no significant weight gain. She experiences dizziness occasionally, particularly when standing up too quickly, and uses a cane for mobility due to balance issues. Physical activity is limited. She has a history of frequent UTIs.   She has a history of significant lower extremity edema, with the left leg being worse than the right. She has had more edema recently after an injury to her leg in September. She completed a round of Doxycycline . She had a recent ultrasound that was negative for DVT. She notes that her leg swelling decreases slightly with elevation. She experiences leg cramps when Furosemide  is increased. She has been to the wound clinic. She has a referral pending to PT for lymphedema. She notes improvement with this in the past. Her PCP has referred her to vascular surgery, but the patient declined the appointment when she was contacted.     ROS-See HPI     Studies Reviewed:  EKG Interpretation Date/Time:  Tuesday October 01 2024 13:31:55 EST Ventricular Rate:  83 PR Interval:  166 QRS Duration:  96 QT Interval:  396 QTC Calculation: 465 R Axis:   -26  Text Interpretation: Normal  sinus rhythm Left axis deviation Moderate voltage criteria for LVH, may be normal variant No significant change since last tracing Confirmed by Lelon Hamilton [phone]) on 10/01/2024 1:41:22 PM    LABS 12/18/23: TSH 3.27 04/29/24: Microalb/Creatinine ratio 9.2 mg/g 06/27/24: K 3.7, SCr 1.82, eGFR 26.73, HGb 13.1, PLT 221K 07/24/24 ([facility]): SCr 1.53, eGFR 35, K 4.5  Radiology Lower extremity venous duplex ultrasound (08/26/2024): No evidence of left lower extremity deep venous thrombosis         Physical Exam:  VS:   BP 132/82 (BP Location: Left Arm, Patient Position: Sitting, Cuff Size: Large)   Pulse 87   Resp 16   Ht 5' 5 (1.651 m)   Wt 213 lb (96.6 kg)   SpO2 95%   BMI 35.45 kg/m        Wt Readings from Last 3 Encounters:  10/01/24 213 lb (96.6 kg)  08/21/24 212 lb 12.8 oz (96.5 kg)  06/27/24 208 lb 6 oz (94.5 kg)    Constitutional:      Appearance: Healthy appearance. Not in distress.  Neck:     Vascular: No JVR.  Pulmonary:     Breath sounds: Normal breath sounds. No wheezing. No rales.  Cardiovascular:     Normal rate. Regular rhythm.     Murmurs: There is no murmur.  Edema:    Edema: brawny; up to the knees.    Pretibial: bilateral 4+ edema of the pretibial area. Abdominal:     Palpations: Abdomen is soft.       Assessment and Plan:    Assessment & Plan Coronary artery disease involving native coronary artery of native heart with angina pectoris S/p BMS to LCx in 2007 and DES to LCx in 2019. Cath in 09/2020 demonstrated a patent LCx stent, mod mLAD stenosis and a chronically occluded sept perf w R to L collaterals. Med Rx was continued. She continues to have stable CCS class II anginal symptoms. She has not had a change in frequency or intensity. Chest discomfort is sometimes relieved by nitroglycerin . - Continue ASA 81 mg daily - Continue Imdur  30 mg daily - Continue Toprol -XL 100 mg daily - Continue Crestor  40 mg daily - Continue nitroglycerin  PRN Chronic heart failure with preserved ejection fraction (HCC) Shortness of breath EF 50% by echocardiogram in 12/2023. She has chronic lower extremity edema, which has recently worsened. Her edema is most likely related to lymphedema and venous insufficiency rather than heart failure. Although, I cannot rule out CHF contributing. She has not had significant weight gain. Her shortness of breath is likely multifactorial and, at least somewhat, due to deconditioning. Her lungs are clear, and there is no HJR on exam. She has frequent  UTIs. Therefore, I do not think that SGLT2i is an option. With her chronic kidney disease, MRA is not likely a good option as well. If eGFR remains > 30 and K+ < 5, it could be considered. If her BP is uncontrolled, it would be reasonable to change Losartan  to Entresto.  - Continue furosemide  20 mg daily - Continue chlorthalidone  25 mg daily - Obtained BMET, BNP today - If BNP is significantly elevated, will consider adjusting furosemide  or changing to torsemide - If diuretics are adjusted, will add potassium supplementation with close follow up on renal function. Bilateral lower extremity edema She has significant lower extremity edema, left worse than right. As noted, a recent ultrasound was negative for DVT on the left. Overall, I think her edema is consistent with lymphedema/venous  insufficiency. She has not had a significant weight gain. Her shortness of breath is likely due to deconditioning. She was referred to vascular surgery but declined an appointment when she was contacted. She sees PT soon.  - Continue follow-up with physical therapy for lymphedema evaluation and management - Referral to vascular surgery could be reconsidered if issues with lower extremity edema and wounds persist Essential hypertension Blood pressure well controlled with current medication regimen. - Continue Imdur  30 mg daily - Continue Losartan  100 mg daily - Continue Toprol -XL 100 mg daily Mixed hyperlipidemia She is intolerant of statins due to myalgias. She has seen Dr. Mona in the [facility]. She has previously declined further medications. She is currently managed with Ezetimibde and Rosuvastatin . She is not fasting today. Goal LDL is at least < 70, ideally < 55.  - Continue rosuvastatin  40 mg daily - Continue Zetia  10 mg daily - Obtain direct LDL and ALT today Stage 3a chronic kidney disease (HCC) Follow up with nephrology as planned. Obtain BMET today as noted.         Dispo:   Return in about 6 months (around 04/01/2025) for Routine Follow Up, w/ Dr. Wonda, or Glendia Ferrier, PA-C.  Signed, Glendia Ferrier, PA-C

## 2024-10-01 ENCOUNTER — Encounter: Payer: Self-pay | Admitting: Physician Assistant

## 2024-10-01 ENCOUNTER — Ambulatory Visit: Attending: Physician Assistant | Admitting: Physician Assistant

## 2024-10-01 VITALS — BP 132/82 | HR 87 | Resp 16 | Ht 65.0 in | Wt 213.0 lb

## 2024-10-01 DIAGNOSIS — I10 Essential (primary) hypertension: Secondary | ICD-10-CM | POA: Diagnosis present

## 2024-10-01 DIAGNOSIS — E782 Mixed hyperlipidemia: Secondary | ICD-10-CM | POA: Diagnosis present

## 2024-10-01 DIAGNOSIS — I5032 Chronic diastolic (congestive) heart failure: Secondary | ICD-10-CM | POA: Diagnosis present

## 2024-10-01 DIAGNOSIS — I25119 Atherosclerotic heart disease of native coronary artery with unspecified angina pectoris: Secondary | ICD-10-CM | POA: Insufficient documentation

## 2024-10-01 DIAGNOSIS — N1831 Chronic kidney disease, stage 3a: Secondary | ICD-10-CM | POA: Insufficient documentation

## 2024-10-01 DIAGNOSIS — R0602 Shortness of breath: Secondary | ICD-10-CM

## 2024-10-01 DIAGNOSIS — R600 Localized edema: Secondary | ICD-10-CM | POA: Insufficient documentation

## 2024-10-01 MED ORDER — ISOSORBIDE MONONITRATE ER 30 MG PO TB24: 30.0000 mg | ORAL_TABLET | Freq: Every day | ORAL | 3 refills | Status: AC

## 2024-10-01 MED ORDER — EZETIMIBE 10 MG PO TABS: 10.0000 mg | ORAL_TABLET | Freq: Every day | ORAL | 3 refills | Status: AC

## 2024-10-01 MED ORDER — METOPROLOL SUCCINATE ER 100 MG PO TB24: 100.0000 mg | ORAL_TABLET | Freq: Every day | ORAL | 3 refills | Status: AC

## 2024-10-01 NOTE — Assessment & Plan Note (Addendum)
 EF 50% by echocardiogram in 12/2023. She has chronic lower extremity edema, which has recently worsened. Her edema is most likely related to lymphedema and venous insufficiency rather than heart failure. Although, I cannot rule out CHF contributing. She has not had significant weight gain. Her shortness of breath is likely multifactorial and, at least somewhat, due to deconditioning. Her lungs are clear, and there is no HJR on exam. She has frequent UTIs. Therefore, I do not think that SGLT2i is an option. With her chronic kidney disease, MRA is not likely a good option as well. If eGFR remains > 30 and K+ < 5, it could be considered. If her BP is uncontrolled, it would be reasonable to change Losartan  to Entresto.  - Continue furosemide  20 mg daily - Continue chlorthalidone  25 mg daily - Obtained BMET, BNP today - If BNP is significantly elevated, will consider adjusting furosemide  or changing to torsemide - If diuretics are adjusted, will add potassium supplementation with close follow up on renal function.

## 2024-10-01 NOTE — Patient Instructions (Signed)
 Medication Instructions:  Your physician recommends that you continue on your current medications as directed. Please refer to the Current Medication list given to you today.  *If you need a refill on your cardiac medications before your next appointment, please call your pharmacy*  Lab Work: BMET, BNP, Direct LDL, ALT-TODAY If you have labs (blood work) drawn today and your tests are completely normal, you will receive your results only by: MyChart Message (if you have MyChart) OR A paper copy in the mail If you have any lab test that is abnormal or we need to change your treatment, we will call you to review the results.  Follow-Up: At [facility], you and your health needs are our priority.  As part of our continuing mission to provide you with exceptional heart care, our providers are all part of one team.  This team includes your primary Cardiologist (physician) and Advanced Practice Providers or APPs (Physician Assistants and Nurse Practitioners) who all work together to provide you with the care you need, when you need it.  Your next appointment:   6 month(s)  Provider:   Ozell Fell, MD or Glendia Ferrier, PA-C

## 2024-10-01 NOTE — Assessment & Plan Note (Signed)
 Follow up with nephrology as planned. Obtain BMET today as noted.

## 2024-10-01 NOTE — Assessment & Plan Note (Signed)
 Blood pressure well controlled with current medication regimen. - Continue Imdur  30 mg daily - Continue Losartan  100 mg daily - Continue Toprol -XL 100 mg daily

## 2024-10-01 NOTE — Assessment & Plan Note (Signed)
 She has significant lower extremity edema, left worse than right. As noted, a recent ultrasound was negative for DVT on the left. Overall, I think her edema is consistent with lymphedema/venous insufficiency. She has not had a significant weight gain. Her shortness of breath is likely due to deconditioning. She was referred to vascular surgery but declined an appointment when she was contacted. She sees PT soon.  - Continue follow-up with physical therapy for lymphedema evaluation and management - Referral to vascular surgery could be reconsidered if issues with lower extremity edema and wounds persist

## 2024-10-02 ENCOUNTER — Ambulatory Visit: Payer: Self-pay | Admitting: Physician Assistant

## 2024-10-02 DIAGNOSIS — R0602 Shortness of breath: Secondary | ICD-10-CM

## 2024-10-02 DIAGNOSIS — I5032 Chronic diastolic (congestive) heart failure: Secondary | ICD-10-CM

## 2024-10-02 DIAGNOSIS — R600 Localized edema: Secondary | ICD-10-CM

## 2024-10-02 LAB — BASIC METABOLIC PANEL WITH GFR
BUN/Creatinine Ratio: 19 (ref 12–28)
BUN: 24 mg/dL (ref 8–27)
CO2: 21 mmol/L (ref 20–29)
Calcium: 9.2 mg/dL (ref 8.7–10.3)
Chloride: 105 mmol/L (ref 96–106)
Creatinine, Ser: 1.25 mg/dL — ABNORMAL HIGH (ref 0.57–1.00)
Glucose: 121 mg/dL — ABNORMAL HIGH (ref 70–99)
Potassium: 4.2 mmol/L (ref 3.5–5.2)
Sodium: 141 mmol/L (ref 134–144)
eGFR: 45 mL/min/1.73 — ABNORMAL LOW (ref >59)

## 2024-10-02 LAB — ALT: ALT: 28 IU/L (ref 0–32)

## 2024-10-02 LAB — PRO B NATRIURETIC PEPTIDE: NT-Pro BNP: 1273 pg/mL — ABNORMAL HIGH (ref 0–738)

## 2024-10-02 LAB — LDL CHOLESTEROL, DIRECT: LDL Direct: 36 mg/dL (ref 0–99)

## 2024-10-02 MED ORDER — POTASSIUM CHLORIDE CRYS ER 10 MEQ PO TBCR: 10.0000 meq | EXTENDED_RELEASE_TABLET | Freq: Every day | ORAL | 3 refills | Status: AC

## 2024-10-02 MED ORDER — FUROSEMIDE 40 MG PO TABS: 40.0000 mg | ORAL_TABLET | Freq: Every day | ORAL | 3 refills | Status: AC

## 2024-10-14 ENCOUNTER — Ambulatory Visit: Attending: Family Medicine | Admitting: Rehabilitation

## 2024-10-14 ENCOUNTER — Other Ambulatory Visit: Payer: Self-pay

## 2024-10-14 ENCOUNTER — Encounter: Payer: Self-pay | Admitting: Rehabilitation

## 2024-10-14 DIAGNOSIS — Z9181 History of falling: Secondary | ICD-10-CM | POA: Insufficient documentation

## 2024-10-14 DIAGNOSIS — R2681 Unsteadiness on feet: Secondary | ICD-10-CM | POA: Insufficient documentation

## 2024-10-14 DIAGNOSIS — I890 Lymphedema, not elsewhere classified: Secondary | ICD-10-CM | POA: Insufficient documentation

## 2024-10-14 DIAGNOSIS — M6281 Muscle weakness (generalized): Secondary | ICD-10-CM | POA: Insufficient documentation

## 2024-10-14 DIAGNOSIS — R269 Unspecified abnormalities of gait and mobility: Secondary | ICD-10-CM | POA: Diagnosis not present

## 2024-10-14 NOTE — Therapy (Signed)
 " OUTPATIENT PHYSICAL THERAPY  LOWER EXTREMITY ONCOLOGY EVALUATION  Patient Name: Tracy [name] MRN: [id number] DOB:Sep 13, 1946, 78 y.o., female Today's Date: 10/14/2024  END OF SESSION:  PT End of Session - 10/14/24 1831     Visit Number 1    Number of Visits 25    Date for Recertification  01/06/25    Authorization Type none    Progress Note Due on Visit 10    PT Start Time 1102    PT Stop Time 1158    PT Time Calculation (min) 56 min    Activity Tolerance Patient tolerated treatment well    Behavior During Therapy [facility] for tasks assessed/performed           Past Medical History:  Diagnosis Date   Allergic rhinitis    Allergy    Anxiety    Arthritis    Ascending aorta dilation 09/13/2022   Echo 09/12/2022: EF 50-55, GR 1 DD, mildly reduced RVSF, normal PASP (RVSP 17.7), mild dilation of ascending aorta (38 mm), RAP 3   Breast cancer (HCC) 04/28/2020   rigtht breast   Bronchitis    CAD (coronary artery disease)    1/19 PCI/DES to pLCX for ISR, normal EF.    Cataract    Cervical dysplasia    unsure of procedure, possible burning in her late 60s   CHF (congestive heart failure) (HCC)    Echo 06/2019: EF 55-60, elevated LVEDP, normal RV SF, mild MAC, mild MR, trivial TR   Complication of anesthesia    COPD (chronic obstructive pulmonary disease) (HCC)    early    Depression    GERD (gastroesophageal reflux disease)    Gout    Heart murmur    Hyperlipidemia    Hypertension    Low back pain    Myocardial infarction (HCC)    Overactive bladder    PONV (postoperative nausea and vomiting)    Sleep apnea    Past Surgical History:  Procedure Laterality Date   ABDOMINAL HYSTERECTOMY     Partial   ANGIOPLASTY     stent 2007   BREAST EXCISIONAL BIOPSY Right 04/2020   ADH/LCIS   BREAST LUMPECTOMY WITH RADIOACTIVE SEED LOCALIZATION Right 04/28/2020   Procedure: RIGHT BREAST LUMPECTOMY X 2  WITH RADIOACTIVE SEED LOCALIZATION;  Surgeon: Belinda Cough, MD;   Location: [facility];  Service: General;  Laterality: Right;  LMA   BREAST SURGERY     CARDIAC CATHETERIZATION     COLONOSCOPY     CORONARY STENT INTERVENTION N/A 11/06/2017   Procedure: CORONARY STENT INTERVENTION;  Surgeon: Mady Bruckner, MD;  Location: [facility];  Service: Cardiovascular;  Laterality: N/A;   EYE SURGERY     LEFT HEART CATH AND CORONARY ANGIOGRAPHY N/A 11/06/2017   Procedure: LEFT HEART CATH AND CORONARY ANGIOGRAPHY;  Surgeon: Mady Bruckner, MD;  Location: [facility];  Service: Cardiovascular;  Laterality: N/A;   LEFT HEART CATH AND CORONARY ANGIOGRAPHY N/A 07/24/2019   Procedure: LEFT HEART CATH AND CORONARY ANGIOGRAPHY;  Surgeon: Verlin Bruckner BIRCH, MD;  Location: [facility];  Service: Cardiovascular;  Laterality: N/A;   RIGHT/LEFT HEART CATH AND CORONARY ANGIOGRAPHY N/A 09/29/2020   Procedure: RIGHT/LEFT HEART CATH AND CORONARY ANGIOGRAPHY;  Surgeon: Claudene Victory ORN, MD;  Location: [facility];  Service: Cardiovascular;  Laterality: N/A;   ROBOTIC ASSISTED BILATERAL SALPINGO OOPHERECTOMY Bilateral 11/10/2020   Procedure: XI ROBOTIC ASSISTED BILATERAL SALPINGO OOPHORECTOMY WITH MINI LAPAROTOMY FOR DRAINAGE;  Surgeon: Viktoria Comer SAUNDERS, MD;  Location: [facility];  Service: Gynecology;  Laterality: Bilateral;  MINI LAP FIRST   TONSILLECTOMY     VULVECTOMY N/A 11/10/2020   Procedure: WIDE EXCISION VULVECTOMY;  Surgeon: Viktoria Comer SAUNDERS, MD;  Location: [facility];  Service: Gynecology;  Laterality: N/A;   Patient Active Problem List   Diagnosis Date Noted   PAD (peripheral artery disease) 04/29/2024   Weakness 10/05/2023   Bronchopneumonia 10/05/2023   Multifocal pneumonia 09/29/2023   Hypokalemia 08/07/2023   Intertrigo 04/18/2023   Venous stasis dermatitis of both lower extremities 11/28/2022   Ascending aorta dilation 09/13/2022   Dizziness 01/19/2022   Frequent falls 01/19/2022   Tremor of both hands 08/16/2021    Aortic atherosclerosis 06/15/2021   Generalized osteoarthritis of multiple sites 12/25/2020   Chronic pain disorder 12/25/2020   Pelvic mass in female    Bilateral lower extremity edema 04/13/2020   Shortness of breath    Anxiety disorder 10/29/2018   CKD (chronic kidney disease), stage III (HCC) 06/27/2018   COPD (chronic obstructive pulmonary disease) (HCC) 05/25/2018   Obesity 03/20/2018   Dyspnea on exertion 11/06/2017   Abnormal stress test 11/06/2017   Chronic heart failure with preserved ejection fraction (HCC) 09/04/2017   Left ventricular dysfunction 07/31/2017   Cataract, nuclear sclerotic senile, bilateral 02/01/2017   Gouty arthritis of toe of left foot 08/09/2016   Hyperuricemia 08/09/2016   Osteoarthritis (arthritis due to wear and tear of joints) 01/14/2014   Atherosclerosis of native coronary artery of native heart with angina pectoris 08/02/2011   Mitral regurgitation 08/02/2011   OVERACTIVE BLADDER 07/02/2010   ACUTE CYSTITIS 05/25/2010   Mixed hyperlipidemia 08/11/2009   Leg edema, left 07/22/2009   GERD 04/11/2008   Constipation 04/11/2008   Essential hypertension 05/30/2007   MYOCARDIAL INFARCTION, HX OF 05/30/2007   Allergic rhinitis 05/30/2007   LOW BACK PAIN 05/30/2007     REFERRING PROVIDER: Betty Jordan, MD  REFERRING DIAG: I89.0  THERAPY DIAG:  Lymphedema, not elsewhere classified  Unsteadiness on feet  Muscle weakness (generalized)  ONSET DATE: unknown  Rationale for Evaluation and Treatment: Rehabilitation  SUBJECTIVE:                                                                                                                                                                                           SUBJECTIVE STATEMENT: I hit my left left on the car door in September and got an open wound.  It is still draining.  I am putting on bandages that have neosporin on it.  I put neosporin on both legs from the itching.   They have  tried  multiple antibiotics for the leg but they don't do much.  The steroids did the most good.  So I am on more lasix  now.    PERTINENT HISTORY: HTN, CAD, COPD, OA, CKD stage 3, Controlled CHF but does have some soft pitting edema bilaterally. 11/10/20: Robotic BSO and removal of left labial mass and fallopian tube 18cm cystic mass, pathology showed serous cystadenoma and benign hidradenoma papilliferum. Size difference noted even back in 2020 with negative doppler for DVT. Recurrent cellultis in the leg.   PAIN:  Are you having pain? Yes NPRS scale: 4/10 up to 10/10 Pain location: mid calf and down   Pain orientation: bil  PAIN TYPE: aching, dull, and tight, burning Pain description: constant  Aggravating factors: movement Relieving factors: when I compress it  PRECAUTIONS: Recurrent infections on the Lt leg, not cured by antibiotics multiple times.   RED FLAGS: None   WEIGHT BEARING RESTRICTIONS: No  FALLS:  Has patient fallen in last 6 months? Yes. Number of falls 4-5 I was walking around the house I can't go upstairs anymore.   I was walking and tripped   LIVING ENVIRONMENT: Lives with: lives with their family and lives with their spouse Lives in: House/apartment Has following equipment at home: Environmental Consultant - 4 wheeled and SPC , bedside commode, Using baby wipes for showering Can get dressed ind but difficult Sleeping in a chair  OCCUPATION: retired   LEISURE: not addressed   PRIOR LEVEL OF FUNCTION: Independent with basic ADLs - see living environment   PATIENT GOALS: improve legs and mobility    OBJECTIVE: Note: Objective measures were completed at Evaluation unless otherwise noted.  COGNITION: Overall cognitive status: Within functional limits for tasks assessed   PALPATION: Deferred due to leg status  OBSERVATIONS / OTHER ASSESSMENTS: Lt LE is larger than Rt up to groin.  Rt leg has edema evident mostly around the ankle with indentions from her shoe and mild redness.  She has an ace wrap around the ankle and lower leg, The Lt leg Lt is a brighter red color.  Various very wet bandages on the lateral and posterior leg that are covering up blisters and that are essentially stuck on to the skin due to time on the leg.  White covering over the legs that pt reports is neosporin and desitin.  Ace wrap over the bandages.  Wearing wet sock and shoe with a harsh odor of exudate.  Also some thick scaly hyperkeratosis on the front of the left leg and some lymphorrhea bumps and skin thickening on the back of the leg. No open places noted, but scabbing over the original wound site and various red blisters that have popped.          Prior Photo from June  Photo from 10/14/24    LYMPHEDEMA ASSESSMENTS:  LOWER EXTREMITY LANDMARK RIGHT 04/15/24 10/14/24  At groin     30 cm proximal to suprapatella     20 cm proximal to suprapatella     10 cm proximal to suprapatella     At midpatella / popliteal crease     30 cm proximal to floor at lateral plantar foot 39   20 cm proximal to floor at lateral plantar foot 29.5   10 cm proximal to floor at lateral plantar foot 28.3   Circumference of ankle/heel     5 cm proximal to 1st MTP joint 23.3   Across MTP joint 23.2   Around proximal great toe 8.0   (  Blank rows = not tested)   LOWER EXTREMITY LANDMARK LEFT 04/12/24 In supine today 04/15/24 10/14/24  At groin       30 cm proximal to suprapatella       20 cm proximal to suprapatella       10 cm proximal to suprapatella 52.2     At midpatella / popliteal crease 43.2     30 cm proximal to floor at lateral plantar foot 44.6 43.5   20 cm proximal to floor at lateral plantar foot 40.5 40.2   10 cm proximal to floor at lateral plantar foot 31.6 31.4   Circumference of ankle/heel       5 cm proximal to 1st MTP joint 23.4 23.3   Across MTP joint 22.7 23   Around proximal great toe 7.6 8.0   (Blank rows = not tested)  GAIT: Distance walked: walks with rollator walker, slow  gait  Outcome measure:  Extreme difficulty/unable (0), Quite a bit of difficulty (1), Moderate difficulty (2), Little difficulty (3), No difficulty (4) Survey date:  10/14/24  Any of your usual work, housework or school activities 1  2. Usual hobbies, recreational or sporting activities 1  3. Getting into/out of the bath 0  4. Walking between rooms 1  5. Putting on socks/shoes 1  6. Squatting  0  7. Lifting an object, like a bag of groceries from the floor 0  8. Performing light activities around your home 0  9. Performing heavy activities around your home 0  10. Getting into/out of a car 1  11. Walking 2 blocks 0  12. Walking 1 mile 0  13. Going up/down 10 stairs (1 flight) 0  14. Standing for 1 hour 0  15.  sitting for 1 hour 1  16. Running on even ground 0  17. Running on uneven ground 0  18. Making sharp turns while running fast 0  19. Hopping  0  20. Rolling over in bed 0  Score total:  6/80    ABC scale The Activities-Specific Balance Confidence (ABC) Scale 0% 10 20 30  40 50 60 70 80 90 100% No confidence<->completely confident  How confident are you that you will not lose your balance or become unsteady when you . . .   Date tested 10/14/24  Walk around the house 40%  2. Walk up or down stairs 0%  3. Bend over and pick up a slipper from in front of a closet floor 10%  4. Reach for a small can off a shelf at eye level 10%  5. Stand on tip toes and reach for something above your head 0%  6. Stand on a chair and reach for something 0%  7. Sweep the floor 0%  8. Walk outside the house to a car parked in the driveway 10%  9. Get into or out of a car 10%  10. Walk across a parking lot to the mall 0%  11. Walk up or down a ramp 0%  12. Walk in a crowded mall where people rapidly walk past you 10%  13. Are bumped into by people as you walk through the mall 10%  14. Step onto or off of an escalator while you are holding onto the railing 10%  15. Step onto or off an  escalator while holding onto parcels such that you cannot hold onto the railing 0%  16. Walk outside on icy sidewalks 0%  Total: #/16 6.8% confidence in balance  TREATMENT DATE:  10/14/24 Eval performed Discussed POC and options - pt did bandaging last time and then switched to velcro which she is up to trying again.  Discussed getting new shoes or socks or at least washing them, the importance of washing the leg every day, changing pads when the get soaked/wet and to try the velcro over the padding for more effectiveness.   Washing leg gently with soap and water , applied ABD pads over open draining blister sites secured with the ace wrap that the patient had on previously with no tension, then tg soft size large.  Pt instructed to donn velcro wraps.   Set photo of breast to Dr. Jordan due to hx of opposite Rt breast cancer     PATIENT EDUCATION:  Education details: Aspects of CDT, POC, Person educated: Patient Education method: Explanation Education comprehension: verbalized understanding  HOME EXERCISE PROGRAM: Use her velcro garment from wound care center, try to sleep out of the recliner , keep leg clean and dry  ASSESSMENT:  CLINICAL IMPRESSION: Patient is a 78 y.o. female known to this clinic who was seen today for continued swelling of bilateral legs Lt>Rt but today with open wound hx, multiple open blisters, worsening of thick hyperkeratosis, and overall poor care of the leg with odor and bandages that have remained on for a long time.  Pt is struggling with the ability to wash the leg due to no access to the tub and shower and using only wipes.  I think previously she was going to try and wash the leg in a bucket, so we will have to check up on that.  She already has velcro wraps but was not using them due to the open wound.  Pt was educated that the velcro  will be helpful as long as she keeps the wounds covered and padded.  Did not get measurements today due to leg status and breast issue.  Will complete CDT 3x per week to attempt to heal the left leg and improve leg status. Then will worry about the balance and strengthening.  Pt may be a good candidate for coban2?   OBJECTIVE IMPAIRMENTS: decreased knowledge of condition, decreased knowledge of use of DME, decreased mobility, and increased edema.   ACTIVITY LIMITATIONS: locomotion level  PARTICIPATION LIMITATIONS: community activity  PERSONAL FACTORS: Time since onset of injury/illness/exacerbation and 1-2 comorbidities: CHF, CKD are also affecting patient's functional outcome.   REHAB POTENTIAL: Fair  CLINICAL DECISION MAKING: high complexity   EVALUATION COMPLEXITY: High   GOALS: Goals reviewed with patient? Yes  SHORT TERM GOALS: Target date: 11/08/24  Pt will be educated on what lymphedema is and how it is best managed  Baseline: Goal status: INITIAL  2.  Pt will be educated on risk reduction and infection prevention including skin care Baseline:  Goal status: INITIAL   LONG TERM GOALS: Target date: 01/06/25  Pt will be ind with final compression for bil lower extremities  Baseline:  Goal status: INITIAL  2.  Pt will be educated on pump and pursue a trial if wanted Baseline:  Goal status: INITIAL  3.  Pt will be ind with remedial exercise  Baseline:  Goal status: INITIAL  4. Pt will return to no open wounds on the Lt lower leg  INITIAL   PLAN:  PT FREQUENCY: 3x/week  PT DURATION: 8 weeks   PLANNED INTERVENTIONS: 97110-Therapeutic exercises, 97530- Therapeutic activity, [id number]- Neuromuscular re-education, 97140- Manual therapy, Patient/Family education, Manual lymph drainage, Compression bandaging, DME instructions,  and Self Care  PLAN FOR NEXT SESSION: CDT Lt lower leg with need for wound care, good washing of the leg and foot, emphasize cleaning of leg at  home, clean socks and shoes.  Get measurementsDEWAINE Larue Saddie JONELLE, PT 10/14/2024, 6:37 PM  "

## 2024-10-23 ENCOUNTER — Ambulatory Visit (HOSPITAL_COMMUNITY): Payer: Self-pay

## 2024-10-25 ENCOUNTER — Ambulatory Visit (HOSPITAL_COMMUNITY)
Admission: RE | Admit: 2024-10-25 | Discharge: 2024-10-25 | Disposition: A | Discharge status: Home / Self Care | Source: Ambulatory Visit

## 2024-10-25 ENCOUNTER — Encounter (HOSPITAL_COMMUNITY): Payer: Self-pay

## 2024-10-25 VITALS — BP 108/68 | HR 80 | Temp 98.7°F | Resp 18

## 2024-10-25 DIAGNOSIS — L03119 Cellulitis of unspecified part of limb: Secondary | ICD-10-CM

## 2024-10-25 DIAGNOSIS — G8929 Other chronic pain: Secondary | ICD-10-CM | POA: Diagnosis not present

## 2024-10-25 DIAGNOSIS — I878 Other specified disorders of veins: Secondary | ICD-10-CM

## 2024-10-25 DIAGNOSIS — M545 Low back pain, unspecified: Secondary | ICD-10-CM

## 2024-10-25 MED ORDER — DOXYCYCLINE HYCLATE 100 MG PO CAPS: 100.0000 mg | ORAL_CAPSULE | Freq: Two times a day (BID) | ORAL | 0 refills | Status: AC

## 2024-10-25 NOTE — ED Triage Notes (Addendum)
 Patient presenting with leg pain and swelling in both bilateral lower legs. Has history of neuropathy in the legs. Patient also has been having diarrhea. Onset current flare up over a week now.  Patient states she started off with blisters on the shins that then turned to wounds. States she thinks the legs are now draining and infected.  Patient tried tylenol  with no relief.

## 2024-10-25 NOTE — Discharge Instructions (Addendum)
" °  1. Venous stasis of both lower extremities (Primary) 2. Cellulitis of lower extremity, unspecified laterality - doxycycline  (VIBRAMYCIN ) 100 MG capsule; Take 1 capsule (100 mg total) by mouth 2 (two) times daily.  Dispense: 20 capsule; Refill: 0 - Unfortunately due to previous history of acute renal injury with elevated GFR taking NSAIDs is not recommended.  Continue taking acetaminophen /Tylenol  for pain secondary to venous stasis. -Follow-up with acute rehab for wound care management of bilateral lower extremity venous stasis edema.  -Continue to monitor symptoms for any change in severity if there is any escalation of current symptoms or development of new symptoms follow-up in ER for further evaluation and management. "

## 2024-10-25 NOTE — ED Provider Notes (Signed)
 " [facility]  Note:  This document was prepared using Dragon voice recognition software and may include unintentional dictation errors.  MRN: [id number] DOB: April 22, 1946  Subjective:   Tracy [name] is a 79 y.o. female presenting for bilateral lower extremity venous stasis edema with pain and weeping.  Patient has history of peripheral neuropathy and severe lower extremity edema.  Patient is set to follow-up with outpatient rehab for wound care management next week but was concern for possible infection due to increased redness and warmth to her lower extremities.  Patient has blisters that have ruptured and are leaking clearish yellow fluid.  Patient been taking Tylenol  with minimal improvement to symptoms.  Patient also reports that she has been having diarrhea, no fever, nausea/vomiting, abdominal pain, flank pain, shortness of breath, chest pain.  Medical record reviewed to evaluate if anti-inflammatory medication would be appropriate for treatment of lower extremity edema and pain.  Patient has history of renal disease with elevated creatinine and GFR approximately 45 mg/dL.  NSAID therapy is contraindicated.  Current Medications[1]   Allergies[2]  Past Medical History:  Diagnosis Date   Allergic rhinitis    Allergy    Anxiety    Arthritis    Ascending aorta dilation 09/13/2022   Echo 09/12/2022: EF 50-55, GR 1 DD, mildly reduced RVSF, normal PASP (RVSP 17.7), mild dilation of ascending aorta (38 mm), RAP 3   Breast cancer (HCC) 04/28/2020   rigtht breast   Bronchitis    CAD (coronary artery disease)    1/19 PCI/DES to pLCX for ISR, normal EF.    Cataract    Cervical dysplasia    unsure of procedure, possible burning in her late 52s   CHF (congestive heart failure) (HCC)    Echo 06/2019: EF 55-60, elevated LVEDP, normal RV SF, mild MAC, mild MR, trivial TR   Complication of anesthesia    COPD (chronic obstructive pulmonary disease) (HCC)    early     Depression    GERD (gastroesophageal reflux disease)    Gout    Heart murmur    Hyperlipidemia    Hypertension    Low back pain    Myocardial infarction (HCC)    Overactive bladder    PONV (postoperative nausea and vomiting)    Sleep apnea      Past Surgical History:  Procedure Laterality Date   ABDOMINAL HYSTERECTOMY     Partial   ANGIOPLASTY     stent 2007   BREAST EXCISIONAL BIOPSY Right 04/2020   ADH/LCIS   BREAST LUMPECTOMY WITH RADIOACTIVE SEED LOCALIZATION Right 04/28/2020   Procedure: RIGHT BREAST LUMPECTOMY X 2  WITH RADIOACTIVE SEED LOCALIZATION;  Surgeon: Belinda Cough, MD;  Location: [facility];  Service: General;  Laterality: Right;  LMA   BREAST SURGERY     CARDIAC CATHETERIZATION     COLONOSCOPY     CORONARY STENT INTERVENTION N/A 11/06/2017   Procedure: CORONARY STENT INTERVENTION;  Surgeon: Mady Bruckner, MD;  Location: [facility];  Service: Cardiovascular;  Laterality: N/A;   EYE SURGERY     LEFT HEART CATH AND CORONARY ANGIOGRAPHY N/A 11/06/2017   Procedure: LEFT HEART CATH AND CORONARY ANGIOGRAPHY;  Surgeon: Mady Bruckner, MD;  Location: [facility];  Service: Cardiovascular;  Laterality: N/A;   LEFT HEART CATH AND CORONARY ANGIOGRAPHY N/A 07/24/2019   Procedure: LEFT HEART CATH AND CORONARY ANGIOGRAPHY;  Surgeon: Verlin Bruckner BIRCH, MD;  Location: [facility];  Service: Cardiovascular;  Laterality: N/A;   RIGHT/LEFT HEART CATH AND CORONARY ANGIOGRAPHY N/A 09/29/2020   Procedure: RIGHT/LEFT HEART CATH AND CORONARY ANGIOGRAPHY;  Surgeon: Claudene Victory ORN, MD;  Location: [facility];  Service: Cardiovascular;  Laterality: N/A;   ROBOTIC ASSISTED BILATERAL SALPINGO OOPHERECTOMY Bilateral 11/10/2020   Procedure: XI ROBOTIC ASSISTED BILATERAL SALPINGO OOPHORECTOMY WITH MINI LAPAROTOMY FOR DRAINAGE;  Surgeon: Viktoria Comer SAUNDERS, MD;  Location: [facility];  Service: Gynecology;  Laterality: Bilateral;  MINI LAP FIRST    TONSILLECTOMY     VULVECTOMY N/A 11/10/2020   Procedure: WIDE EXCISION VULVECTOMY;  Surgeon: Viktoria Comer SAUNDERS, MD;  Location: [facility];  Service: Gynecology;  Laterality: N/A;    Family History  Adopted: Yes  Problem Relation Age of Onset   Colon cancer Mother 54   Cancer Mother        colon   Depression Mother        Suicide.   Early death Mother    Alzheimer's disease Maternal Uncle    Diabetes Daughter        type 2   Other Other        patient is adopted   Stomach cancer Neg Hx     Social History[3]  ROS Refer to HPI for ROS details.  Objective:    Vitals: BP 108/68 (BP Location: Right Arm)   Pulse 80   Temp 98.7 F (37.1 C) (Oral)   Resp 18   SpO2 93%   Physical Exam Vitals and nursing note reviewed.  Constitutional:      General: She is not in acute distress.    Appearance: Normal appearance. She is well-developed. She is not ill-appearing or toxic-appearing.  HENT:     Head: Normocephalic and atraumatic.  Cardiovascular:     Rate and Rhythm: Normal rate.  Pulmonary:     Effort: Pulmonary effort is normal. No respiratory distress.     Breath sounds: No stridor. No wheezing.  Skin:    General: Skin is warm and dry.     Findings: Erythema and wound present.      Neurological:     General: No focal deficit present.     Mental Status: She is alert and oriented to person, place, and time.  Psychiatric:        Mood and Affect: Mood normal.        Behavior: Behavior normal.     Procedures  No results found. However, due to the size of the patient record, not all encounters were searched. Please check Results Review for a complete set of results.  Assessment and Plan :     Discharge Instructions       1. Venous stasis of both lower extremities (Primary) 2. Cellulitis of lower extremity, unspecified laterality - doxycycline  (VIBRAMYCIN ) 100 MG capsule; Take 1 capsule (100 mg total) by mouth 2 (two) times daily.  Dispense: 20 capsule;  Refill: 0 - Unfortunately due to previous history of acute renal injury with elevated GFR taking NSAIDs is not recommended.  Continue taking acetaminophen /Tylenol  for pain secondary to venous stasis. -Follow-up with acute rehab for wound care management of bilateral lower extremity venous stasis edema.  -Continue to monitor symptoms for any change in severity if there is any escalation of current symptoms or development of new symptoms follow-up in ER for further evaluation and management.      Hyun Reali B Decarlos Empey    [1] No current facility-administered medications for this encounter.  Current Outpatient Medications:    doxycycline  (  VIBRAMYCIN ) 100 MG capsule, Take 1 capsule (100 mg total) by mouth 2 (two) times daily., Disp: 20 capsule, Rfl: 0   acetaminophen  (TYLENOL ) 650 MG CR tablet, Take 1,300 mg by mouth 2 (two) times daily as needed for pain., Disp: , Rfl:    albuterol  (VENTOLIN  HFA) 108 (90 Base) MCG/ACT inhaler, Inhale 2 puffs into the lungs every 6 (six) hours as needed for wheezing or shortness of breath., Disp: 8.5 g, Rfl: 6   allopurinol  (ZYLOPRIM ) 100 MG tablet, TAKE 1 TABLET BY MOUTH EVERY DAY, Disp: 90 tablet, Rfl: 1   aluminum sulfate-calcium  acetate (DOMEBORO) packet, Apply 1 packet topically 3 (three) times daily., Disp: 100 each, Rfl: 12   aspirin  EC 81 MG tablet, Take 1 tablet (81 mg total) by mouth daily. Swallow whole., Disp: , Rfl:    chlorthalidone  (HYGROTON ) 25 MG tablet, TAKE 1 TABLET (25 MG TOTAL) BY MOUTH DAILY., Disp: 90 tablet, Rfl: 2   estradiol  (ESTRACE  VAGINAL) 0.1 MG/GM vaginal cream, Apply a pea sized amount just inside the vagina nightly for 2 weeks, then twice weekly thereafter (Patient taking differently: Place 1 Applicatorful vaginally at bedtime. Apply a pea sized amount just inside the vagina nightly for 2 weeks, then twice weekly thereafter), Disp: 42.5 g, Rfl: 12   ezetimibe  (ZETIA ) 10 MG tablet, Take 1 tablet (10 mg total) by mouth daily., Disp: 90  tablet, Rfl: 3   fluconazole  (DIFLUCAN ) 150 MG tablet, 1 tab po x 1, repeat in 3 days, Disp: 2 tablet, Rfl: 0   furosemide  (LASIX ) 40 MG tablet, Take 1 tablet (40 mg total) by mouth daily., Disp: 90 tablet, Rfl: 3   isosorbide  mononitrate (IMDUR ) 30 MG 24 hr tablet, Take 1 tablet (30 mg total) by mouth daily., Disp: 90 tablet, Rfl: 3   losartan  (COZAAR ) 100 MG tablet, TAKE 1 TABLET BY MOUTH EVERY DAY, Disp: 90 tablet, Rfl: 3   magnesium 30 MG tablet, Take 30 mg by mouth 2 (two) times daily., Disp: , Rfl:    metoprolol  succinate (TOPROL -XL) 100 MG 24 hr tablet, Take 1 tablet (100 mg total) by mouth daily. TAKE WITH OR IMMEDIATELY FOLLOWING A MEAL., Disp: 90 tablet, Rfl: 3   mupirocin  ointment (BACTROBAN ) 2 %, Apply 1 Application topically 2 (two) times daily., Disp: 22 g, Rfl: 1   nitroGLYCERIN  (NITROSTAT ) 0.4 MG SL tablet, Q 5 min x 3 in 24 hours as needed., Disp: 25 tablet, Rfl: 6   NON FORMULARY, Take 1 capsule by mouth daily. Neuropathy Support, Disp: , Rfl:    pantoprazole  (PROTONIX ) 40 MG tablet, TAKE 1 TABLET BY MOUTH EVERY DAY, Disp: 90 tablet, Rfl: 2   potassium chloride  (KLOR-CON  M) 10 MEQ tablet, Take 1 tablet (10 mEq total) by mouth daily., Disp: 90 tablet, Rfl: 3   rosuvastatin  (CRESTOR ) 40 MG tablet, Take 1 tablet (40 mg total) by mouth daily., Disp: 90 tablet, Rfl: 3   traMADol  (ULTRAM ) 50 MG tablet, Take 0.5 tablets (25 mg total) by mouth every 12 (twelve) hours as needed., Disp: 30 tablet, Rfl: 2   triamcinolone  cream (KENALOG ) 0.1 %, Apply topically daily as needed (for leg dermatitis.)., Disp: 45 g, Rfl: 1 [2]  Allergies Allergen Reactions   Ak-Mycin [Erythromycin] Rash   Sulfamethoxazole Rash  [3]  Social History Tobacco Use   Smoking status: Former    Current packs/day: 0.00    Average packs/day: 2.0 packs/day for 52.0 years (104.0 ttl pk-yrs)    Types: Cigarettes    Start date: 1966  Quit date: 2018    Years since quitting: 8.0   Smokeless tobacco: Never    Tobacco comments:    completely quit May of 2018; period of years she did not smoke   Vaping Use   Vaping status: Never Used  Substance Use Topics   Alcohol use: Not Currently    Comment: seldom   Drug use: No     Aurea Goodell B, NP 10/25/24 1732  "

## 2024-10-28 ENCOUNTER — Ambulatory Visit: Attending: Family Medicine

## 2024-10-28 DIAGNOSIS — R2681 Unsteadiness on feet: Secondary | ICD-10-CM | POA: Diagnosis present

## 2024-10-28 DIAGNOSIS — I890 Lymphedema, not elsewhere classified: Secondary | ICD-10-CM | POA: Insufficient documentation

## 2024-10-28 DIAGNOSIS — M6281 Muscle weakness (generalized): Secondary | ICD-10-CM | POA: Insufficient documentation

## 2024-10-28 DIAGNOSIS — M5459 Other low back pain: Secondary | ICD-10-CM | POA: Diagnosis present

## 2024-10-28 DIAGNOSIS — R296 Repeated falls: Secondary | ICD-10-CM | POA: Diagnosis present

## 2024-10-28 NOTE — Therapy (Signed)
 " OUTPATIENT PHYSICAL THERAPY  LOWER EXTREMITY ONCOLOGY TREATMENT  Patient Name: Tracy [name] MRN: [id number] DOB:09/03/1946, 79 y.o., female Today's Date: 10/28/2024  END OF SESSION:  PT End of Session - 10/28/24 1210     Visit Number 2    Number of Visits 25    Date for Recertification  01/06/25    Authorization Type none    Progress Note Due on Visit 10    PT Start Time 1204    PT Stop Time 1317    PT Time Calculation (min) 73 min    Activity Tolerance Patient tolerated treatment well;Patient limited by pain    Behavior During Therapy [facility] for tasks assessed/performed           Past Medical History:  Diagnosis Date   Allergic rhinitis    Allergy    Anxiety    Arthritis    Ascending aorta dilation 09/13/2022   Echo 09/12/2022: EF 50-55, GR 1 DD, mildly reduced RVSF, normal PASP (RVSP 17.7), mild dilation of ascending aorta (38 mm), RAP 3   Breast cancer (HCC) 04/28/2020   rigtht breast   Bronchitis    CAD (coronary artery disease)    1/19 PCI/DES to pLCX for ISR, normal EF.    Cataract    Cervical dysplasia    unsure of procedure, possible burning in her late 30s   CHF (congestive heart failure) (HCC)    Echo 06/2019: EF 55-60, elevated LVEDP, normal RV SF, mild MAC, mild MR, trivial TR   Complication of anesthesia    COPD (chronic obstructive pulmonary disease) (HCC)    early    Depression    GERD (gastroesophageal reflux disease)    Gout    Heart murmur    Hyperlipidemia    Hypertension    Low back pain    Myocardial infarction (HCC)    Overactive bladder    PONV (postoperative nausea and vomiting)    Sleep apnea    Past Surgical History:  Procedure Laterality Date   ABDOMINAL HYSTERECTOMY     Partial   ANGIOPLASTY     stent 2007   BREAST EXCISIONAL BIOPSY Right 04/2020   ADH/LCIS   BREAST LUMPECTOMY WITH RADIOACTIVE SEED LOCALIZATION Right 04/28/2020   Procedure: RIGHT BREAST LUMPECTOMY X 2  WITH RADIOACTIVE SEED LOCALIZATION;  Surgeon:  Belinda Cough, MD;  Location: [facility];  Service: General;  Laterality: Right;  LMA   BREAST SURGERY     CARDIAC CATHETERIZATION     COLONOSCOPY     CORONARY STENT INTERVENTION N/A 11/06/2017   Procedure: CORONARY STENT INTERVENTION;  Surgeon: Mady Bruckner, MD;  Location: [facility];  Service: Cardiovascular;  Laterality: N/A;   EYE SURGERY     LEFT HEART CATH AND CORONARY ANGIOGRAPHY N/A 11/06/2017   Procedure: LEFT HEART CATH AND CORONARY ANGIOGRAPHY;  Surgeon: Mady Bruckner, MD;  Location: [facility];  Service: Cardiovascular;  Laterality: N/A;   LEFT HEART CATH AND CORONARY ANGIOGRAPHY N/A 07/24/2019   Procedure: LEFT HEART CATH AND CORONARY ANGIOGRAPHY;  Surgeon: Verlin Bruckner BIRCH, MD;  Location: [facility];  Service: Cardiovascular;  Laterality: N/A;   RIGHT/LEFT HEART CATH AND CORONARY ANGIOGRAPHY N/A 09/29/2020   Procedure: RIGHT/LEFT HEART CATH AND CORONARY ANGIOGRAPHY;  Surgeon: Claudene Victory ORN, MD;  Location: [facility];  Service: Cardiovascular;  Laterality: N/A;   ROBOTIC ASSISTED BILATERAL SALPINGO OOPHERECTOMY Bilateral 11/10/2020   Procedure: XI ROBOTIC ASSISTED BILATERAL SALPINGO OOPHORECTOMY WITH MINI LAPAROTOMY  FOR DRAINAGE;  Surgeon: Viktoria Comer SAUNDERS, MD;  Location: [facility];  Service: Gynecology;  Laterality: Bilateral;  MINI LAP FIRST   TONSILLECTOMY     VULVECTOMY N/A 11/10/2020   Procedure: WIDE EXCISION VULVECTOMY;  Surgeon: Viktoria Comer SAUNDERS, MD;  Location: [facility];  Service: Gynecology;  Laterality: N/A;   Patient Active Problem List   Diagnosis Date Noted   PAD (peripheral artery disease) 04/29/2024   Weakness 10/05/2023   Bronchopneumonia 10/05/2023   Multifocal pneumonia 09/29/2023   Hypokalemia 08/07/2023   Intertrigo 04/18/2023   Venous stasis dermatitis of both lower extremities 11/28/2022   Ascending aorta dilation 09/13/2022   Dizziness 01/19/2022   Frequent falls 01/19/2022   Tremor of  both hands 08/16/2021   Aortic atherosclerosis 06/15/2021   Generalized osteoarthritis of multiple sites 12/25/2020   Chronic pain disorder 12/25/2020   Pelvic mass in female    Bilateral lower extremity edema 04/13/2020   Shortness of breath    Anxiety disorder 10/29/2018   CKD (chronic kidney disease), stage III (HCC) 06/27/2018   COPD (chronic obstructive pulmonary disease) (HCC) 05/25/2018   Obesity 03/20/2018   Dyspnea on exertion 11/06/2017   Abnormal stress test 11/06/2017   Chronic heart failure with preserved ejection fraction (HCC) 09/04/2017   Left ventricular dysfunction 07/31/2017   Cataract, nuclear sclerotic senile, bilateral 02/01/2017   Gouty arthritis of toe of left foot 08/09/2016   Hyperuricemia 08/09/2016   Osteoarthritis (arthritis due to wear and tear of joints) 01/14/2014   Atherosclerosis of native coronary artery of native heart with angina pectoris 08/02/2011   Mitral regurgitation 08/02/2011   OVERACTIVE BLADDER 07/02/2010   ACUTE CYSTITIS 05/25/2010   Mixed hyperlipidemia 08/11/2009   Leg edema, left 07/22/2009   GERD 04/11/2008   Constipation 04/11/2008   Essential hypertension 05/30/2007   MYOCARDIAL INFARCTION, HX OF 05/30/2007   Allergic rhinitis 05/30/2007   LOW BACK PAIN 05/30/2007     REFERRING PROVIDER: Betty Jordan, MD  REFERRING DIAG: I89.0  THERAPY DIAG:  Lymphedema, not elsewhere classified  Unsteadiness on feet  Muscle weakness (generalized)  ONSET DATE: unknown  Rationale for Evaluation and Treatment: Rehabilitation  SUBJECTIVE:                                                                                                                                                                                           SUBJECTIVE STATEMENT: I went to urgent care 1/9 because my legs were hurting so bad. They put me on doxycyline but they didn't even look out my legs!    PERTINENT HISTORY: HTN, CAD, COPD, OA, CKD stage 3,  Controlled CHF but does  have some soft pitting edema bilaterally. 11/10/20: Robotic BSO and removal of left labial mass and fallopian tube 18cm cystic mass, pathology showed serous cystadenoma and benign hidradenoma papilliferum. Size difference noted even back in 2020 with negative doppler for DVT. Recurrent cellultis in the leg.   PAIN:  Are you having pain? Yes NPRS scale: 7/10 Lt, 5/10 Rt Pain location: mid calf and down   Pain orientation: bil  PAIN TYPE: aching, dull, and tight, burning Pain description: constant  Aggravating factors: movement Relieving factors: when I compress it  PRECAUTIONS: Recurrent infections on the Lt leg, not cured by antibiotics multiple times.   RED FLAGS: None   WEIGHT BEARING RESTRICTIONS: No  FALLS:  Has patient fallen in last 6 months? Yes. Number of falls 4-5 I was walking around the house I can't go upstairs anymore.   I was walking and tripped   LIVING ENVIRONMENT: Lives with: lives with their family and lives with their spouse Lives in: House/apartment Has following equipment at home: Environmental Consultant - 4 wheeled and SPC , bedside commode, Using baby wipes for showering Can get dressed ind but difficult Sleeping in a chair  OCCUPATION: retired   LEISURE: not addressed   PRIOR LEVEL OF FUNCTION: Independent with basic ADLs - see living environment   PATIENT GOALS: improve legs and mobility    OBJECTIVE: Note: Objective measures were completed at Evaluation unless otherwise noted.  COGNITION: Overall cognitive status: Within functional limits for tasks assessed   PALPATION: Deferred due to leg status  OBSERVATIONS / OTHER ASSESSMENTS: Lt LE is larger than Rt up to groin.  Rt leg has edema evident mostly around the ankle with indentions from her shoe and mild redness. She has an ace wrap around the ankle and lower leg, The Lt leg Lt is a brighter red color.  Various very wet bandages on the lateral and posterior leg that are covering up  blisters and that are essentially stuck on to the skin due to time on the leg.  White covering over the legs that pt reports is neosporin and desitin.  Ace wrap over the bandages.  Wearing wet sock and shoe with a harsh odor of exudate.  Also some thick scaly hyperkeratosis on the front of the left leg and some lymphorrhea bumps and skin thickening on the back of the leg. No open places noted, but scabbing over the original wound site and various red blisters that have popped.          Prior Photo from June  Photo from 10/14/24    LYMPHEDEMA ASSESSMENTS:  LOWER EXTREMITY LANDMARK RIGHT 04/15/24 10/14/24  At groin     30 cm proximal to suprapatella     20 cm proximal to suprapatella     10 cm proximal to suprapatella     At midpatella / popliteal crease     30 cm proximal to floor at lateral plantar foot 39   20 cm proximal to floor at lateral plantar foot 29.5   10 cm proximal to floor at lateral plantar foot 28.3   Circumference of ankle/heel     5 cm proximal to 1st MTP joint 23.3   Across MTP joint 23.2   Around proximal great toe 8.0   (Blank rows = not tested)   LOWER EXTREMITY LANDMARK LEFT 04/12/24 In supine today 04/15/24 10/14/24  At groin       30 cm proximal to suprapatella       20 cm proximal to suprapatella  10 cm proximal to suprapatella 52.2     At midpatella / popliteal crease 43.2     30 cm proximal to floor at lateral plantar foot 44.6 43.5   20 cm proximal to floor at lateral plantar foot 40.5 40.2   10 cm proximal to floor at lateral plantar foot 31.6 31.4   Circumference of ankle/heel       5 cm proximal to 1st MTP joint 23.4 23.3   Across MTP joint 22.7 23   Around proximal great toe 7.6 8.0   (Blank rows = not tested)  GAIT: Distance walked: walks with rollator walker, slow gait  Outcome measure:  Extreme difficulty/unable (0), Quite a bit of difficulty (1), Moderate difficulty (2), Little difficulty (3), No difficulty (4) Survey date:   10/14/24  Any of your usual work, housework or school activities 1  2. Usual hobbies, recreational or sporting activities 1  3. Getting into/out of the bath 0  4. Walking between rooms 1  5. Putting on socks/shoes 1  6. Squatting  0  7. Lifting an object, like a bag of groceries from the floor 0  8. Performing light activities around your home 0  9. Performing heavy activities around your home 0  10. Getting into/out of a car 1  11. Walking 2 blocks 0  12. Walking 1 mile 0  13. Going up/down 10 stairs (1 flight) 0  14. Standing for 1 hour 0  15.  sitting for 1 hour 1  16. Running on even ground 0  17. Running on uneven ground 0  18. Making sharp turns while running fast 0  19. Hopping  0  20. Rolling over in bed 0  Score total:  6/80    ABC scale The Activities-Specific Balance Confidence (ABC) Scale 0% 10 20 30  40 50 60 70 80 90 100% No confidence<->completely confident  How confident are you that you will not lose your balance or become unsteady when you . . .   Date tested 10/14/24  Walk around the house 40%  2. Walk up or down stairs 0%  3. Bend over and pick up a slipper from in front of a closet floor 10%  4. Reach for a small can off a shelf at eye level 10%  5. Stand on tip toes and reach for something above your head 0%  6. Stand on a chair and reach for something 0%  7. Sweep the floor 0%  8. Walk outside the house to a car parked in the driveway 10%  9. Get into or out of a car 10%  10. Walk across a parking lot to the mall 0%  11. Walk up or down a ramp 0%  12. Walk in a crowded mall where people rapidly walk past you 10%  13. Are bumped into by people as you walk through the mall 10%  14. Step onto or off of an escalator while you are holding onto the railing 10%  15. Step onto or off an escalator while holding onto parcels such that you cannot hold onto the railing 0%  16. Walk outside on icy sidewalks 0%  Total: #/16 6.8% confidence in balance  TREATMENT DATE:  10/25/24: Manual Therapy Spent session removing pts bandages that she had applied at home since eval here on 12/29. Washed legs with soap and warm water  as able. Had to avoid large open blisters that were bleeding: one at Rt anterior lower leg and other, more painful one, at Lt posterior thigh. Just squeezed fresh water  from towel over sores to allow for some cleaning and patted dry to tolerance. Applied cocoa butter to both legs, again avoiding open areas Compression Bandaging as follows:  Rt LE: Non adhesive x 2 gauze over open blister on shin, ABD pad over that and at dorsum of foot covering lateral malleolous where pt also reports tenderness and has increased hyperkeratosis; TG soft from foot to knee, Mollelast to toe 1-4 folding bandage in half for toes 3 and 4.  Short stretch compression bandages: 6 cm Roman Sandal to foot 8 cm ASH pattern to foot/ankle and took up lower leg 12 cm from ankle to knee Lt LE: Non adhesive bandage x 3 covering posterior thigh, ABD pabd to ant and post leg and taped these together to limit rubbing when pulling TG soft on TG soft from foot to knee and Mollelast to toe 1-4 folding bandage in half for toes 3 and 4. Short stretch compression bandages: 6 cm Roman sandal and and ASH pattern combined 12 cm heel lock and then spiral to knee Folded stockinette back over top of both bandages and put surgical booties on bil feet Pt reports bandages both comfortable at end of session.   10/14/24 Eval performed Discussed POC and options - pt did bandaging last time and then switched to velcro which she is up to trying again.  Discussed getting new shoes or socks or at least washing them, the importance of washing the leg every day, changing pads when the get soaked/wet and to try the velcro over the padding for more effectiveness.   Washing  leg gently with soap and water , applied ABD pads over open draining blister sites secured with the ace wrap that the patient had on previously with no tension, then tg soft size large.  Pt instructed to donn velcro wraps.   Set photo of breast to Dr. Jordan due to hx of opposite Rt breast cancer     PATIENT EDUCATION:  Education details: Aspects of CDT, POC, Person educated: Patient Education method: Explanation Education comprehension: verbalized understanding  HOME EXERCISE PROGRAM: Use her velcro garment from wound care center, try to sleep out of the recliner , keep leg clean and dry  ASSESSMENT:  CLINICAL IMPRESSION: Pt had gone to urgent care Friday due to increased LE pain and edema. Today spent session removing her bandages, washed her legs with soap and water  as able and as pain allowed due to open blisters. Her LE's did seem improved as far as redness and some healing from picture taken in chart on eval. Once all removed, being exceptionally careful at Lt posterior thigh due to her wrap had stuck to wound causing much increased pain, then reapplied bandages as described above. Expect her open blisters to heal well once her lymphedema reduces. Also discussed importance of getting her mammogram scheduled as she is due for this and has new skin irritations on her Lt breast. Pt verbalized feeling overwhelmed with appts right now due to bil LE pain but advised her to at least get it scheduled in next few weeks to allow time for legs to improve with PT and she agreed.   OBJECTIVE  IMPAIRMENTS: decreased knowledge of condition, decreased knowledge of use of DME, decreased mobility, and increased edema.   ACTIVITY LIMITATIONS: locomotion level  PARTICIPATION LIMITATIONS: community activity  PERSONAL FACTORS: Time since onset of injury/illness/exacerbation and 1-2 comorbidities: CHF, CKD are also affecting patient's functional outcome.   REHAB POTENTIAL: Fair  CLINICAL DECISION MAKING:  high complexity   EVALUATION COMPLEXITY: High   GOALS: Goals reviewed with patient? Yes  SHORT TERM GOALS: Target date: 11/08/24  Pt will be educated on what lymphedema is and how it is best managed  Baseline: Goal status: INITIAL  2.  Pt will be educated on risk reduction and infection prevention including skin care Baseline:  Goal status: INITIAL   LONG TERM GOALS: Target date: 01/06/25  Pt will be ind with final compression for bil lower extremities  Baseline:  Goal status: INITIAL  2.  Pt will be educated on pump and pursue a trial if wanted Baseline:  Goal status: INITIAL  3.  Pt will be ind with remedial exercise  Baseline:  Goal status: INITIAL  4. Pt will return to no open wounds on the Lt lower leg  INITIAL   PLAN:  PT FREQUENCY: 3x/week  PT DURATION: 8 weeks   PLANNED INTERVENTIONS: 97110-Therapeutic exercises, 97530- Therapeutic activity, 97112- Neuromuscular re-education, 97140- Manual therapy, Patient/Family education, Manual lymph drainage, Compression bandaging, DME instructions, and Self Care  PLAN FOR NEXT SESSION: Did she get mammogram scheduled? How did she tolerate bandages? Cont with above including CDT Lt lower leg with need for wound care, and try application of velcro to Rt lower leg as her swelling here is less and will be manageable with velcro; cont washing of the leg and foot, emphasize cleaning of leg at home, clean socks and shoes.  Get measurements*  Aden Berwyn Caldron, PTA 10/28/2024, 2:18 PM  "

## 2024-10-29 ENCOUNTER — Telehealth: Payer: Self-pay | Admitting: *Deleted

## 2024-10-29 ENCOUNTER — Other Ambulatory Visit: Payer: Self-pay | Admitting: Family Medicine

## 2024-10-29 DIAGNOSIS — G8929 Other chronic pain: Secondary | ICD-10-CM

## 2024-10-29 NOTE — Telephone Encounter (Signed)
 Copied from CRM [id number]. Topic: Clinical - Medication Refill >> Oct 29, 2024  8:34 AM Tonda B wrote: Medication: traMADol  (ULTRAM ) 50 MG tablet   Has the patient contacted their pharmacy? Yes (Agent: If no, request that the patient contact the pharmacy for the refill. If patient does not wish to contact the pharmacy document the reason why and proceed with request.) (Agent: If yes, when and what did the pharmacy advise?)  This is the patient's preferred pharmacy:  [id number] [place], [place] - [address] AT [facility] [address] [place] [address] Phone: [phone] Fax: [phone]   Is this the correct pharmacy for this prescription? Yes If no, delete pharmacy and type the correct one.   Has the prescription been filled recently? Yes  Is the patient out of the medication? Yes  Has the patient been seen for an appointment in the last year OR does the patient have an upcoming appointment? Yes  Can we respond through MyChart? No  Agent: Please be advised that Rx refills may take up to 3 business days. We ask that you follow-up with your pharmacy. >> Oct 29, 2024  3:20 PM Emylou G wrote: Patient advising in extreme pain, her legs are wrapped and throbbing.. infection in legs - says she is hurting bad.. says was in the urgent care about her legs on Friday was put on antibiotics.[name] struggling with sleep - refusing triage.. Wants her refill asap.. see attached

## 2024-10-30 ENCOUNTER — Ambulatory Visit

## 2024-10-30 DIAGNOSIS — I890 Lymphedema, not elsewhere classified: Secondary | ICD-10-CM | POA: Diagnosis not present

## 2024-10-30 DIAGNOSIS — R2681 Unsteadiness on feet: Secondary | ICD-10-CM

## 2024-10-30 DIAGNOSIS — M6281 Muscle weakness (generalized): Secondary | ICD-10-CM

## 2024-10-30 NOTE — Therapy (Signed)
 " OUTPATIENT PHYSICAL THERAPY  LOWER EXTREMITY ONCOLOGY TREATMENT  Patient Name: Tracy [name] MRN: [id number] DOB:Jun 26, 1946, 79 y.o., female Today's Date: 10/30/2024  END OF SESSION:  PT End of Session - 10/30/24 1214     Visit Number 3    Number of Visits 25    Date for Recertification  01/06/25    Authorization Type none    Progress Note Due on Visit 10    PT Start Time 1154    PT Stop Time 1307    PT Time Calculation (min) 73 min    Activity Tolerance Patient tolerated treatment well    Behavior During Therapy [facility] for tasks assessed/performed           Past Medical History:  Diagnosis Date   Allergic rhinitis    Allergy    Anxiety    Arthritis    Ascending aorta dilation 09/13/2022   Echo 09/12/2022: EF 50-55, GR 1 DD, mildly reduced RVSF, normal PASP (RVSP 17.7), mild dilation of ascending aorta (38 mm), RAP 3   Breast cancer (HCC) 04/28/2020   rigtht breast   Bronchitis    CAD (coronary artery disease)    1/19 PCI/DES to pLCX for ISR, normal EF.    Cataract    Cervical dysplasia    unsure of procedure, possible burning in her late 89s   CHF (congestive heart failure) (HCC)    Echo 06/2019: EF 55-60, elevated LVEDP, normal RV SF, mild MAC, mild MR, trivial TR   Complication of anesthesia    COPD (chronic obstructive pulmonary disease) (HCC)    early    Depression    GERD (gastroesophageal reflux disease)    Gout    Heart murmur    Hyperlipidemia    Hypertension    Low back pain    Myocardial infarction (HCC)    Overactive bladder    PONV (postoperative nausea and vomiting)    Sleep apnea    Past Surgical History:  Procedure Laterality Date   ABDOMINAL HYSTERECTOMY     Partial   ANGIOPLASTY     stent 2007   BREAST EXCISIONAL BIOPSY Right 04/2020   ADH/LCIS   BREAST LUMPECTOMY WITH RADIOACTIVE SEED LOCALIZATION Right 04/28/2020   Procedure: RIGHT BREAST LUMPECTOMY X 2  WITH RADIOACTIVE SEED LOCALIZATION;  Surgeon: Belinda Cough, MD;   Location: [facility];  Service: General;  Laterality: Right;  LMA   BREAST SURGERY     CARDIAC CATHETERIZATION     COLONOSCOPY     CORONARY STENT INTERVENTION N/A 11/06/2017   Procedure: CORONARY STENT INTERVENTION;  Surgeon: Mady Bruckner, MD;  Location: [facility];  Service: Cardiovascular;  Laterality: N/A;   EYE SURGERY     LEFT HEART CATH AND CORONARY ANGIOGRAPHY N/A 11/06/2017   Procedure: LEFT HEART CATH AND CORONARY ANGIOGRAPHY;  Surgeon: Mady Bruckner, MD;  Location: [facility];  Service: Cardiovascular;  Laterality: N/A;   LEFT HEART CATH AND CORONARY ANGIOGRAPHY N/A 07/24/2019   Procedure: LEFT HEART CATH AND CORONARY ANGIOGRAPHY;  Surgeon: Verlin Bruckner BIRCH, MD;  Location: [facility];  Service: Cardiovascular;  Laterality: N/A;   RIGHT/LEFT HEART CATH AND CORONARY ANGIOGRAPHY N/A 09/29/2020   Procedure: RIGHT/LEFT HEART CATH AND CORONARY ANGIOGRAPHY;  Surgeon: Claudene Victory ORN, MD;  Location: [facility];  Service: Cardiovascular;  Laterality: N/A;   ROBOTIC ASSISTED BILATERAL SALPINGO OOPHERECTOMY Bilateral 11/10/2020   Procedure: XI ROBOTIC ASSISTED BILATERAL SALPINGO OOPHORECTOMY WITH MINI LAPAROTOMY FOR DRAINAGE;  Surgeon: Viktoria Comer SAUNDERS, MD;  Location: [facility];  Service: Gynecology;  Laterality: Bilateral;  MINI LAP FIRST   TONSILLECTOMY     VULVECTOMY N/A 11/10/2020   Procedure: WIDE EXCISION VULVECTOMY;  Surgeon: Viktoria Comer SAUNDERS, MD;  Location: [facility];  Service: Gynecology;  Laterality: N/A;   Patient Active Problem List   Diagnosis Date Noted   PAD (peripheral artery disease) 04/29/2024   Weakness 10/05/2023   Bronchopneumonia 10/05/2023   Multifocal pneumonia 09/29/2023   Hypokalemia 08/07/2023   Intertrigo 04/18/2023   Venous stasis dermatitis of both lower extremities 11/28/2022   Ascending aorta dilation 09/13/2022   Dizziness 01/19/2022   Frequent falls 01/19/2022   Tremor of both hands 08/16/2021    Aortic atherosclerosis 06/15/2021   Generalized osteoarthritis of multiple sites 12/25/2020   Chronic pain disorder 12/25/2020   Pelvic mass in female    Bilateral lower extremity edema 04/13/2020   Shortness of breath    Anxiety disorder 10/29/2018   CKD (chronic kidney disease), stage III (HCC) 06/27/2018   COPD (chronic obstructive pulmonary disease) (HCC) 05/25/2018   Obesity 03/20/2018   Dyspnea on exertion 11/06/2017   Abnormal stress test 11/06/2017   Chronic heart failure with preserved ejection fraction (HCC) 09/04/2017   Left ventricular dysfunction 07/31/2017   Cataract, nuclear sclerotic senile, bilateral 02/01/2017   Gouty arthritis of toe of left foot 08/09/2016   Hyperuricemia 08/09/2016   Osteoarthritis (arthritis due to wear and tear of joints) 01/14/2014   Atherosclerosis of native coronary artery of native heart with angina pectoris 08/02/2011   Mitral regurgitation 08/02/2011   OVERACTIVE BLADDER 07/02/2010   ACUTE CYSTITIS 05/25/2010   Mixed hyperlipidemia 08/11/2009   Leg edema, left 07/22/2009   GERD 04/11/2008   Constipation 04/11/2008   Essential hypertension 05/30/2007   MYOCARDIAL INFARCTION, HX OF 05/30/2007   Allergic rhinitis 05/30/2007   LOW BACK PAIN 05/30/2007     REFERRING PROVIDER: Betty Jordan, MD  REFERRING DIAG: I89.0  THERAPY DIAG:  Lymphedema, not elsewhere classified  Unsteadiness on feet  Muscle weakness (generalized)  ONSET DATE: unknown  Rationale for Evaluation and Treatment: Rehabilitation  SUBJECTIVE:                                                                                                                                                                                           SUBJECTIVE STATEMENT: The bandages overall were comfortable. My legs were a little itchy but I was able to scratch over the bandage and that felt good. I think it's just because they are healing. I tried calling about a mammogram but I  can't find the phone number.  PERTINENT HISTORY: HTN, CAD, COPD, OA, CKD stage 3, Controlled CHF but does have some soft pitting edema bilaterally. 11/10/20: Robotic BSO and removal of left labial mass and fallopian tube 18cm cystic mass, pathology showed serous cystadenoma and benign hidradenoma papilliferum. Size difference noted even back in 2020 with negative doppler for DVT. Recurrent cellultis in the leg.   PAIN:  Are you having pain? Forgot to ask for a number but pt reports both legs were hurting less today than at last session  PRECAUTIONS: Recurrent infections on the Lt leg, not cured by antibiotics multiple times.   RED FLAGS: None   WEIGHT BEARING RESTRICTIONS: No  FALLS:  Has patient fallen in last 6 months? Yes. Number of falls 4-5 I was walking around the house I can't go upstairs anymore.   I was walking and tripped   LIVING ENVIRONMENT: Lives with: lives with their family and lives with their spouse Lives in: House/apartment Has following equipment at home: [employer] - 4 wheeled and SPC , bedside commode, Using baby wipes for showering Can get dressed ind but difficult Sleeping in a chair  OCCUPATION: retired   LEISURE: not addressed   PRIOR LEVEL OF FUNCTION: Independent with basic ADLs - see living environment   PATIENT GOALS: improve legs and mobility    OBJECTIVE: Note: Objective measures were completed at Evaluation unless otherwise noted.  COGNITION: Overall cognitive status: Within functional limits for tasks assessed   PALPATION: Deferred due to leg status  OBSERVATIONS / OTHER ASSESSMENTS: Lt LE is larger than Rt up to groin.  Rt leg has edema evident mostly around the ankle with indentions from her shoe and mild redness. She has an ace wrap around the ankle and lower leg, The Lt leg Lt is a brighter red color.  Various very wet bandages on the lateral and posterior leg that are covering up blisters and that are essentially stuck on to the skin due  to time on the leg.  White covering over the legs that pt reports is neosporin and desitin.  Ace wrap over the bandages.  Wearing wet sock and shoe with a harsh odor of exudate.  Also some thick scaly hyperkeratosis on the front of the left leg and some lymphorrhea bumps and skin thickening on the back of the leg. No open places noted, but scabbing over the original wound site and various red blisters that have popped.              Prior Photo from June  Photo from 10/14/24   Photos from 10/30/24         LYMPHEDEMA ASSESSMENTS:  LOWER EXTREMITY LANDMARK RIGHT 04/15/24 10/14/24  At groin     30 cm proximal to suprapatella     20 cm proximal to suprapatella     10 cm proximal to suprapatella     At midpatella / popliteal crease     30 cm proximal to floor at lateral plantar foot 39   20 cm proximal to floor at lateral plantar foot 29.5   10 cm proximal to floor at lateral plantar foot 28.3   Circumference of ankle/heel     5 cm proximal to 1st MTP joint 23.3   Across MTP joint 23.2   Around proximal great toe 8.0   (Blank rows = not tested)   LOWER EXTREMITY LANDMARK LEFT 04/12/24 In supine today 04/15/24 10/30/2024  At groin       30 cm proximal to suprapatella  20 cm proximal to suprapatella       10 cm proximal to suprapatella 52.2     At midpatella / popliteal crease 43.2     30 cm proximal to floor at lateral plantar foot 44.6 43.5 40.6  20 cm proximal to floor at lateral plantar foot 40.5 40.2 36.7  10 cm proximal to floor at lateral plantar foot 31.6 31.4 30.9  Circumference of ankle/heel       5 cm proximal to 1st MTP joint 23.4 23.3 24.4  Across MTP joint 22.7 23 23.5  Around proximal great toe 7.6 8.0 7.9  (Blank rows = not tested)  GAIT: Distance walked: walks with rollator walker, slow gait  Outcome measure:  Extreme difficulty/unable (0), Quite a bit of difficulty (1), Moderate difficulty (2), Little difficulty (3), No difficulty (4) Survey date:   10/14/24  Any of your usual work, housework or school activities 1  2. Usual hobbies, recreational or sporting activities 1  3. Getting into/out of the bath 0  4. Walking between rooms 1  5. Putting on socks/shoes 1  6. Squatting  0  7. Lifting an object, like a bag of groceries from the floor 0  8. Performing light activities around your home 0  9. Performing heavy activities around your home 0  10. Getting into/out of a car 1  11. Walking 2 blocks 0  12. Walking 1 mile 0  13. Going up/down 10 stairs (1 flight) 0  14. Standing for 1 hour 0  15.  sitting for 1 hour 1  16. Running on even ground 0  17. Running on uneven ground 0  18. Making sharp turns while running fast 0  19. Hopping  0  20. Rolling over in bed 0  Score total:  6/80    ABC scale The Activities-Specific Balance Confidence (ABC) Scale 0% 10 20 30  40 50 60 70 80 90 100% No confidence<->completely confident  How confident are you that you will not lose your balance or become unsteady when you . . .   Date tested 10/14/24  Walk around the house 40%  2. Walk up or down stairs 0%  3. Bend over and pick up a slipper from in front of a closet floor 10%  4. Reach for a small can off a shelf at eye level 10%  5. Stand on tip toes and reach for something above your head 0%  6. Stand on a chair and reach for something 0%  7. Sweep the floor 0%  8. Walk outside the house to a car parked in the driveway 10%  9. Get into or out of a car 10%  10. Walk across a parking lot to the mall 0%  11. Walk up or down a ramp 0%  12. Walk in a crowded mall where people rapidly walk past you 10%  13. Are bumped into by people as you walk through the mall 10%  14. Step onto or off of an escalator while you are holding onto the railing 10%  15. Step onto or off an escalator while holding onto parcels such that you cannot hold onto the railing 0%  16. Walk outside on icy sidewalks 0%  Total: #/16 6.8% confidence in balance  TREATMENT DATE:  10/30/24: Manual Therapy and Self Care Removed bandages and washed legs assessing skin. Open blisters are still very red but not bleeding as much as they were at last session, especially the Lt which is the worse LE. Her sensitivity was some improved, though overall still very tender to touch. The nonadhesive gauze came off much easier today than her bandages at last session so continued with use of this and ABD pads. There was drainage from both sores but reasonbly so for open, healing blisters. No increase redness or warmth noted. She does still have an intact, smaller blister at her Rt lower leg that may open under bandages.  Compression Bandaging as follows with cocoa butter applied to both LE's:  Rt LE: Non adhesive x 2 gauze over open blister on shin, ABD pad over that and at dorsum of foot covering lateral malleolous where pt also reports tenderness and has increased hyperkeratosis; TG soft from foot to knee, Mollelast to toe 1-4 folding bandage in half for toes 3 and 4, and then Artiflex from foot to knee.  Short stretch compression bandages: 6 cm Roman Sandal to foot 8 cm ASH pattern to foot/ankle and finished some of bandage up lower leg 12 cm from ankle to knee Lt LE: Non adhesive bandage x 3 covering posterior thigh blister, ABD pad to ant and post leg and taped these together to limit rubbing when pulling TG soft on TG soft from foot to knee and Mollelast to toe 1-4 folding bandage in half for toes 3 and 4, and then Artiflex from foot to knee. Short stretch compression bandages: Added 1/4 gray foam to dorsal foot and fixated with 6 cm 6 cm Roman sandal and and ASH pattern combined 12 cm heel lock and then spiral to knee Folded stockinette back over top of both bandages and put surgical booties on bil feet Pt reports bandages both comfortable at end of  session.   10/28/24: Manual Therapy Spent session removing pts bandages that she had applied at home since eval here on 12/29. Washed legs with soap and warm water  as able. Had to avoid large open blisters that were bleeding: one at Rt anterior lower leg and other, more painful one, at Lt posterior thigh. Just squeezed fresh water  from towel over sores to allow for some cleaning and patted dry to tolerance. Applied cocoa butter to both legs, again avoiding open areas Compression Bandaging as follows:  Rt LE: Non adhesive x 2 gauze over open blister on shin, ABD pad over that and at dorsum of foot covering lateral malleolous where pt also reports tenderness and has increased hyperkeratosis; TG soft from foot to knee, Mollelast to toe 1-4 folding bandage in half for toes 3 and 4.  Short stretch compression bandages: 6 cm Roman Sandal to foot 8 cm ASH pattern to foot/ankle and took up lower leg 12 cm from ankle to knee Lt LE: Non adhesive bandage x 3 covering posterior thigh, ABD pabd to ant and post leg and taped these together to limit rubbing when pulling TG soft on TG soft from foot to knee and Mollelast to toe 1-4 folding bandage in half for toes 3 and 4. Short stretch compression bandages: 6 cm Roman sandal and and ASH pattern combined 12 cm heel lock and then spiral to knee Folded stockinette back over top of both bandages and put surgical booties on bil feet Pt reports bandages both comfortable at end of session.   10/14/24 Eval performed Discussed  POC and options - pt did bandaging last time and then switched to velcro which she is up to trying again.  Discussed getting new shoes or socks or at least washing them, the importance of washing the leg every day, changing pads when the get soaked/wet and to try the velcro over the padding for more effectiveness.   Washing leg gently with soap and water , applied ABD pads over open draining blister sites secured with the ace wrap that the patient  had on previously with no tension, then tg soft size large.  Pt instructed to donn velcro wraps.   Set photo of breast to Dr. Jordan due to hx of opposite Rt breast cancer     PATIENT EDUCATION:  Education details: Aspects of CDT, POC, Person educated: Patient Education method: Explanation Education comprehension: verbalized understanding  HOME EXERCISE PROGRAM: Use her velcro garment from wound care center, try to sleep out of the recliner , keep leg clean and dry  ASSESSMENT:  CLINICAL IMPRESSION: Skin re assessed after removal of bandages, see above. As pt tolerated compression well and healing is noticed, continued with compression bandaging and wound dressings for drainage. Considered velcro for Rt LE but pt is nervous that if this comes off she won't be able to get this back on as she has tried before and struggled with this. So may cont with bandaging for now, however she will benefit from continued physical therapy to further promote lymphatic drainage for optimal healing environment and healthier skin. Also to help her become independent with donning her compression garments as she reports her husband will not be assisting much.  OBJECTIVE IMPAIRMENTS: decreased knowledge of condition, decreased knowledge of use of DME, decreased mobility, and increased edema.   ACTIVITY LIMITATIONS: locomotion level  PARTICIPATION LIMITATIONS: community activity  PERSONAL FACTORS: Time since onset of injury/illness/exacerbation and 1-2 comorbidities: CHF, CKD are also affecting patient's functional outcome.   REHAB POTENTIAL: Fair  CLINICAL DECISION MAKING: high complexity   EVALUATION COMPLEXITY: High   GOALS: Goals reviewed with patient? Yes  SHORT TERM GOALS: Target date: 11/08/24  Pt will be educated on what lymphedema is and how it is best managed  Baseline: Goal status: INITIAL  2.  Pt will be educated on risk reduction and infection prevention including skin care Baseline:   Goal status: INITIAL   LONG TERM GOALS: Target date: 01/06/25  Pt will be ind with final compression for bil lower extremities  Baseline:  Goal status: INITIAL  2.  Pt will be educated on pump and pursue a trial if wanted Baseline:  Goal status: INITIAL  3.  Pt will be ind with remedial exercise  Baseline:  Goal status: INITIAL  4. Pt will return to no open wounds on the Lt lower leg  INITIAL   PLAN:  PT FREQUENCY: 3x/week  PT DURATION: 8 weeks   PLANNED INTERVENTIONS: 97110-Therapeutic exercises, 97530- Therapeutic activity, 97112- Neuromuscular re-education, 97140- Manual therapy, Patient/Family education, Manual lymph drainage, Compression bandaging, DME instructions, and Self Care  PLAN FOR NEXT SESSION: Did she get mammogram scheduled? Need phone number (forgot to give her this today) How did she tolerate bandages? Cont with above including CDT Lt lower leg with need for wound care, and try application of velcro to Rt lower leg as her swelling here is less and will be manageable with velcro; cont washing of the leg and foot, emphasize cleaning of leg at home, clean socks and shoes.  Get measurements*  Aden Berwyn Caldron, PTA  10/30/2024, 3:55 PM  "

## 2024-10-30 NOTE — Telephone Encounter (Signed)
 Rx for Tramadol  was sent. [facility]

## 2024-11-01 ENCOUNTER — Ambulatory Visit: Admitting: Rehabilitation

## 2024-11-01 ENCOUNTER — Encounter: Payer: Self-pay | Admitting: Rehabilitation

## 2024-11-01 DIAGNOSIS — I890 Lymphedema, not elsewhere classified: Secondary | ICD-10-CM

## 2024-11-01 DIAGNOSIS — R2681 Unsteadiness on feet: Secondary | ICD-10-CM

## 2024-11-01 DIAGNOSIS — M6281 Muscle weakness (generalized): Secondary | ICD-10-CM

## 2024-11-01 NOTE — Therapy (Signed)
 " OUTPATIENT PHYSICAL THERAPY  LOWER EXTREMITY ONCOLOGY TREATMENT  Patient Name: LYLIE BLACKLOCK MRN: [id number] DOB:Sep 15, 1946, 79 y.o., female Today's Date: 11/01/2024  END OF SESSION:  PT End of Session - 11/01/24 1156     Visit Number 4    Number of Visits 25    Date for Recertification  01/06/25    PT Start Time 1050    PT Stop Time 1150    PT Time Calculation (min) 60 min    Activity Tolerance Patient tolerated treatment well    Behavior During Therapy [facility] for tasks assessed/performed            Past Medical History:  Diagnosis Date   Allergic rhinitis    Allergy    Anxiety    Arthritis    Ascending aorta dilation 09/13/2022   Echo 09/12/2022: EF 50-55, GR 1 DD, mildly reduced RVSF, normal PASP (RVSP 17.7), mild dilation of ascending aorta (38 mm), RAP 3   Breast cancer (HCC) 04/28/2020   rigtht breast   Bronchitis    CAD (coronary artery disease)    1/19 PCI/DES to pLCX for ISR, normal EF.    Cataract    Cervical dysplasia    unsure of procedure, possible burning in her late 89s   CHF (congestive heart failure) (HCC)    Echo 06/2019: EF 55-60, elevated LVEDP, normal RV SF, mild MAC, mild MR, trivial TR   Complication of anesthesia    COPD (chronic obstructive pulmonary disease) (HCC)    early    Depression    GERD (gastroesophageal reflux disease)    Gout    Heart murmur    Hyperlipidemia    Hypertension    Low back pain    Myocardial infarction (HCC)    Overactive bladder    PONV (postoperative nausea and vomiting)    Sleep apnea    Past Surgical History:  Procedure Laterality Date   ABDOMINAL HYSTERECTOMY     Partial   ANGIOPLASTY     stent 2007   BREAST EXCISIONAL BIOPSY Right 04/2020   ADH/LCIS   BREAST LUMPECTOMY WITH RADIOACTIVE SEED LOCALIZATION Right 04/28/2020   Procedure: RIGHT BREAST LUMPECTOMY X 2  WITH RADIOACTIVE SEED LOCALIZATION;  Surgeon: Belinda Cough, MD;  Location: [facility];  Service: General;   Laterality: Right;  LMA   BREAST SURGERY     CARDIAC CATHETERIZATION     COLONOSCOPY     CORONARY STENT INTERVENTION N/A 11/06/2017   Procedure: CORONARY STENT INTERVENTION;  Surgeon: Mady Bruckner, MD;  Location: [facility];  Service: Cardiovascular;  Laterality: N/A;   EYE SURGERY     LEFT HEART CATH AND CORONARY ANGIOGRAPHY N/A 11/06/2017   Procedure: LEFT HEART CATH AND CORONARY ANGIOGRAPHY;  Surgeon: Mady Bruckner, MD;  Location: [facility];  Service: Cardiovascular;  Laterality: N/A;   LEFT HEART CATH AND CORONARY ANGIOGRAPHY N/A 07/24/2019   Procedure: LEFT HEART CATH AND CORONARY ANGIOGRAPHY;  Surgeon: Verlin Bruckner BIRCH, MD;  Location: [facility];  Service: Cardiovascular;  Laterality: N/A;   RIGHT/LEFT HEART CATH AND CORONARY ANGIOGRAPHY N/A 09/29/2020   Procedure: RIGHT/LEFT HEART CATH AND CORONARY ANGIOGRAPHY;  Surgeon: Claudene Victory ORN, MD;  Location: [facility];  Service: Cardiovascular;  Laterality: N/A;   ROBOTIC ASSISTED BILATERAL SALPINGO OOPHERECTOMY Bilateral 11/10/2020   Procedure: XI ROBOTIC ASSISTED BILATERAL SALPINGO OOPHORECTOMY WITH MINI LAPAROTOMY FOR DRAINAGE;  Surgeon: Viktoria Comer SAUNDERS, MD;  Location: [facility];  Service: Gynecology;  Laterality:  Bilateral;  MINI LAP FIRST   TONSILLECTOMY     VULVECTOMY N/A 11/10/2020   Procedure: WIDE EXCISION VULVECTOMY;  Surgeon: Viktoria Comer SAUNDERS, MD;  Location: [facility];  Service: Gynecology;  Laterality: N/A;   Patient Active Problem List   Diagnosis Date Noted   PAD (peripheral artery disease) 04/29/2024   Weakness 10/05/2023   Bronchopneumonia 10/05/2023   Multifocal pneumonia 09/29/2023   Hypokalemia 08/07/2023   Intertrigo 04/18/2023   Venous stasis dermatitis of both lower extremities 11/28/2022   Ascending aorta dilation 09/13/2022   Dizziness 01/19/2022   Frequent falls 01/19/2022   Tremor of both hands 08/16/2021   Aortic atherosclerosis 06/15/2021   Generalized  osteoarthritis of multiple sites 12/25/2020   Chronic pain disorder 12/25/2020   Pelvic mass in female    Bilateral lower extremity edema 04/13/2020   Shortness of breath    Anxiety disorder 10/29/2018   CKD (chronic kidney disease), stage III (HCC) 06/27/2018   COPD (chronic obstructive pulmonary disease) (HCC) 05/25/2018   Obesity 03/20/2018   Dyspnea on exertion 11/06/2017   Abnormal stress test 11/06/2017   Chronic heart failure with preserved ejection fraction (HCC) 09/04/2017   Left ventricular dysfunction 07/31/2017   Cataract, nuclear sclerotic senile, bilateral 02/01/2017   Gouty arthritis of toe of left foot 08/09/2016   Hyperuricemia 08/09/2016   Osteoarthritis (arthritis due to wear and tear of joints) 01/14/2014   Atherosclerosis of native coronary artery of native heart with angina pectoris 08/02/2011   Mitral regurgitation 08/02/2011   OVERACTIVE BLADDER 07/02/2010   ACUTE CYSTITIS 05/25/2010   Mixed hyperlipidemia 08/11/2009   Leg edema, left 07/22/2009   GERD 04/11/2008   Constipation 04/11/2008   Essential hypertension 05/30/2007   MYOCARDIAL INFARCTION, HX OF 05/30/2007   Allergic rhinitis 05/30/2007   LOW BACK PAIN 05/30/2007     REFERRING PROVIDER: Betty Jordan, MD  REFERRING DIAG: I89.0  THERAPY DIAG:  Lymphedema, not elsewhere classified  Unsteadiness on feet  Muscle weakness (generalized)  ONSET DATE: unknown  Rationale for Evaluation and Treatment: Rehabilitation  SUBJECTIVE:                                                                                                                                                                                           SUBJECTIVE STATEMENT: They were okay. Just a little itchy  PERTINENT HISTORY: HTN, CAD, COPD, OA, CKD stage 3, Controlled CHF but does have some soft pitting edema bilaterally. 11/10/20: Robotic BSO and removal of left labial mass and fallopian tube 18cm cystic mass, pathology showed  serous cystadenoma and benign hidradenoma papilliferum. Size difference noted even back in 2020 with negative  doppler for DVT. Recurrent cellultis in the leg.   PAIN:  Are you having pain? Forgot to ask for a number but pt reports both legs were hurting less today than at last session  PRECAUTIONS: Recurrent infections on the Lt leg, not cured by antibiotics multiple times.   RED FLAGS: None   WEIGHT BEARING RESTRICTIONS: No  FALLS:  Has patient fallen in last 6 months? Yes. Number of falls 4-5 I was walking around the house I can't go upstairs anymore.   I was walking and tripped   LIVING ENVIRONMENT: Lives with: lives with their family and lives with their spouse Lives in: House/apartment Has following equipment at home: [employer] - 4 wheeled and SPC , bedside commode, Using baby wipes for showering Can get dressed ind but difficult Sleeping in a chair  OCCUPATION: retired   LEISURE: not addressed   PRIOR LEVEL OF FUNCTION: Independent with basic ADLs - see living environment   PATIENT GOALS: improve legs and mobility    OBJECTIVE: Note: Objective measures were completed at Evaluation unless otherwise noted.  COGNITION: Overall cognitive status: Within functional limits for tasks assessed   PALPATION: Deferred due to leg status  OBSERVATIONS / OTHER ASSESSMENTS: Lt LE is larger than Rt up to groin.  Rt leg has edema evident mostly around the ankle with indentions from her shoe and mild redness. She has an ace wrap around the ankle and lower leg, The Lt leg Lt is a brighter red color.  Various very wet bandages on the lateral and posterior leg that are covering up blisters and that are essentially stuck on to the skin due to time on the leg.  White covering over the legs that pt reports is neosporin and desitin.  Ace wrap over the bandages.  Wearing wet sock and shoe with a harsh odor of exudate.  Also some thick scaly hyperkeratosis on the front of the left leg and some  lymphorrhea bumps and skin thickening on the back of the leg. No open places noted, but scabbing over the original wound site and various red blisters that have popped.              Prior Photo from June  Photo from 10/14/24   Photos from 10/30/24         LYMPHEDEMA ASSESSMENTS:  LOWER EXTREMITY LANDMARK RIGHT 04/15/24 10/14/24  At groin     30 cm proximal to suprapatella     20 cm proximal to suprapatella     10 cm proximal to suprapatella     At midpatella / popliteal crease     30 cm proximal to floor at lateral plantar foot 39   20 cm proximal to floor at lateral plantar foot 29.5   10 cm proximal to floor at lateral plantar foot 28.3   Circumference of ankle/heel     5 cm proximal to 1st MTP joint 23.3   Across MTP joint 23.2   Around proximal great toe 8.0   (Blank rows = not tested)   LOWER EXTREMITY LANDMARK LEFT 04/12/24 In supine today 04/15/24 10/30/2024  At groin       30 cm proximal to suprapatella       20 cm proximal to suprapatella       10 cm proximal to suprapatella 52.2     At midpatella / popliteal crease 43.2     30 cm proximal to floor at lateral plantar foot 44.6 43.5 40.6  20 cm proximal to floor  at lateral plantar foot 40.5 40.2 36.7  10 cm proximal to floor at lateral plantar foot 31.6 31.4 30.9  Circumference of ankle/heel       5 cm proximal to 1st MTP joint 23.4 23.3 24.4  Across MTP joint 22.7 23 23.5  Around proximal great toe 7.6 8.0 7.9  (Blank rows = not tested)  GAIT: Distance walked: walks with rollator walker, slow gait  Outcome measure:  Extreme difficulty/unable (0), Quite a bit of difficulty (1), Moderate difficulty (2), Little difficulty (3), No difficulty (4) Survey date:  10/14/24  Any of your usual work, housework or school activities 1  2. Usual hobbies, recreational or sporting activities 1  3. Getting into/out of the bath 0  4. Walking between rooms 1  5. Putting on socks/shoes 1  6. Squatting  0  7. Lifting an  object, like a bag of groceries from the floor 0  8. Performing light activities around your home 0  9. Performing heavy activities around your home 0  10. Getting into/out of a car 1  11. Walking 2 blocks 0  12. Walking 1 mile 0  13. Going up/down 10 stairs (1 flight) 0  14. Standing for 1 hour 0  15.  sitting for 1 hour 1  16. Running on even ground 0  17. Running on uneven ground 0  18. Making sharp turns while running fast 0  19. Hopping  0  20. Rolling over in bed 0  Score total:  6/80    ABC scale The Activities-Specific Balance Confidence (ABC) Scale 0% 10 20 30  40 50 60 70 80 90 100% No confidence<->completely confident  How confident are you that you will not lose your balance or become unsteady when you . . .   Date tested 10/14/24  Walk around the house 40%  2. Walk up or down stairs 0%  3. Bend over and pick up a slipper from in front of a closet floor 10%  4. Reach for a small can off a shelf at eye level 10%  5. Stand on tip toes and reach for something above your head 0%  6. Stand on a chair and reach for something 0%  7. Sweep the floor 0%  8. Walk outside the house to a car parked in the driveway 10%  9. Get into or out of a car 10%  10. Walk across a parking lot to the mall 0%  11. Walk up or down a ramp 0%  12. Walk in a crowded mall where people rapidly walk past you 10%  13. Are bumped into by people as you walk through the mall 10%  14. Step onto or off of an escalator while you are holding onto the railing 10%  15. Step onto or off an escalator while holding onto parcels such that you cannot hold onto the railing 0%  16. Walk outside on icy sidewalks 0%  Total: #/16 6.8% confidence in balance  TREATMENT DATE:  11/01/24: Manual Therapy and Self Care Removed bandages and washed legs assessing skin. Applied wound care coverings    Compression Bandaging as follows with cocoa butter applied to both LE's:  Rt LE: Non adhesive x 2 gauze over open blister on shin, ABD pad over that and at dorsum of foot covering lateral malleolous where pt also reports tenderness and has increased hyperkeratosis; TG soft from foot to knee, Mollelast to toe 1-4 folding bandage in half for toes 3 and 4, and then Artiflex from foot to knee.  Short stretch compression bandages: 6 cm Roman Sandal to foot 8 cm ASH pattern to foot/ankle and finished some of bandage up lower leg 12 cm from ankle to knee Lt LE: Non adhesive bandage x 3 covering posterior thigh blister, with no ABD pad due to no drainage after last time.   TG soft from foot to knee and Mollelast to toe 1-4 folding bandage in half for toes 3 and 4, and then Artiflex from foot to knee. Short stretch compression bandages: Added 1/4 gray foam to dorsal foot and fixated with 6 cm 6 cm Roman sandal and and ASH pattern combined 12 cm heel lock and then spiral to knee Folded stockinette back over top of both bandages and put surgical booties on bil feet Pt reports bandages both comfortable at end of session.   10/30/24: Manual Therapy and Self Care Removed bandages and washed legs assessing skin. Open blisters are still very red but not bleeding as much as they were at last session, especially the Lt which is the worse LE. Her sensitivity was some improved, though overall still very tender to touch. The nonadhesive gauze came off much easier today than her bandages at last session so continued with use of this and ABD pads. There was drainage from both sores but reasonbly so for open, healing blisters. No increase redness or warmth noted. She does still have an intact, smaller blister at her Rt lower leg that may open under bandages.  Compression Bandaging as follows with cocoa butter applied to both LE's:  Rt LE: Non adhesive x 2 gauze over open blister on shin, ABD pad over that and at  dorsum of foot covering lateral malleolous where pt also reports tenderness and has increased hyperkeratosis; TG soft from foot to knee, Mollelast to toe 1-4 folding bandage in half for toes 3 and 4, and then Artiflex from foot to knee.  Short stretch compression bandages: 6 cm Roman Sandal to foot 8 cm ASH pattern to foot/ankle and finished some of bandage up lower leg 12 cm from ankle to knee Lt LE: Non adhesive bandage x 3 covering posterior thigh blister, ABD pad to ant and post leg and taped these together to limit rubbing when pulling TG soft on TG soft from foot to knee and Mollelast to toe 1-4 folding bandage in half for toes 3 and 4, and then Artiflex from foot to knee. Short stretch compression bandages: Added 1/4 gray foam to dorsal foot and fixated with 6 cm 6 cm Roman sandal and and ASH pattern combined 12 cm heel lock and then spiral to knee Folded stockinette back over top of both bandages and put surgical booties on bil feet Pt reports bandages both comfortable at end of session.   10/28/24: Manual Therapy Spent session removing pts bandages that she had applied at home since eval here on 12/29. Washed legs with soap and warm water  as able. Had to avoid large open  blisters that were bleeding: one at Rt anterior lower leg and other, more painful one, at Lt posterior thigh. Just squeezed fresh water  from towel over sores to allow for some cleaning and patted dry to tolerance. Applied cocoa butter to both legs, again avoiding open areas Compression Bandaging as follows:  Rt LE: Non adhesive x 2 gauze over open blister on shin, ABD pad over that and at dorsum of foot covering lateral malleolous where pt also reports tenderness and has increased hyperkeratosis; TG soft from foot to knee, Mollelast to toe 1-4 folding bandage in half for toes 3 and 4.  Short stretch compression bandages: 6 cm Roman Sandal to foot 8 cm ASH pattern to foot/ankle and took up lower leg 12 cm from ankle to  knee Lt LE: Non adhesive bandage x 3 covering posterior thigh, ABD pabd to ant and post leg and taped these together to limit rubbing when pulling TG soft on TG soft from foot to knee and Mollelast to toe 1-4 folding bandage in half for toes 3 and 4. Short stretch compression bandages: 6 cm Roman sandal and and ASH pattern combined 12 cm heel lock and then spiral to knee Folded stockinette back over top of both bandages and put surgical booties on bil feet Pt reports bandages both comfortable at end of session.   PATIENT EDUCATION:  Education details: Aspects of CDT, POC, Person educated: Patient Education method: Explanation Education comprehension: verbalized understanding  HOME EXERCISE PROGRAM: Use her velcro garment from wound care center, try to sleep out of the recliner , keep leg clean and dry  ASSESSMENT:  CLINICAL IMPRESSION: Skin re assessed after removal of bandages.  Rt skin still with an intact blister.  The Left leg has more adherence of the gauze to the blister bed but removed easily with some water .  Tried Lt leg without ABD due to clean ABD x 2 and to prevent more abnormal pressure.   OBJECTIVE IMPAIRMENTS: decreased knowledge of condition, decreased knowledge of use of DME, decreased mobility, and increased edema.   ACTIVITY LIMITATIONS: locomotion level  PARTICIPATION LIMITATIONS: community activity  PERSONAL FACTORS: Time since onset of injury/illness/exacerbation and 1-2 comorbidities: CHF, CKD are also affecting patient's functional outcome.   REHAB POTENTIAL: Fair  CLINICAL DECISION MAKING: high complexity   EVALUATION COMPLEXITY: High   GOALS: Goals reviewed with patient? Yes  SHORT TERM GOALS: Target date: 11/08/24  Pt will be educated on what lymphedema is and how it is best managed  Baseline: Goal status: INITIAL  2.  Pt will be educated on risk reduction and infection prevention including skin care Baseline:  Goal status: INITIAL   LONG  TERM GOALS: Target date: 01/06/25  Pt will be ind with final compression for bil lower extremities  Baseline:  Goal status: INITIAL  2.  Pt will be educated on pump and pursue a trial if wanted Baseline:  Goal status: INITIAL  3.  Pt will be ind with remedial exercise  Baseline:  Goal status: INITIAL  4. Pt will return to no open wounds on the Lt lower leg  INITIAL   PLAN:  PT FREQUENCY: 3x/week  PT DURATION: 8 weeks   PLANNED INTERVENTIONS: 97110-Therapeutic exercises, 97530- Therapeutic activity, 97112- Neuromuscular re-education, 97140- Manual therapy, Patient/Family education, Manual lymph drainage, Compression bandaging, DME instructions, and Self Care  PLAN FOR NEXT SESSION: Did she get mammogram scheduled? Need phone number (forgot to give her this today) How did she tolerate bandages? Cont with above including CDT  Lt lower leg with need for wound care, and try application of velcro to Rt lower leg as her swelling here is less and will be manageable with velcro; cont washing of the leg and foot, emphasize cleaning of leg at home, clean socks and shoes.    Larue Saddie SAUNDERS, PT 11/01/2024, 11:58 AM  "

## 2024-11-04 ENCOUNTER — Ambulatory Visit: Admitting: Rehabilitation

## 2024-11-04 ENCOUNTER — Encounter: Payer: Self-pay | Admitting: Rehabilitation

## 2024-11-04 DIAGNOSIS — M5459 Other low back pain: Secondary | ICD-10-CM

## 2024-11-04 DIAGNOSIS — R296 Repeated falls: Secondary | ICD-10-CM

## 2024-11-04 DIAGNOSIS — I890 Lymphedema, not elsewhere classified: Secondary | ICD-10-CM | POA: Diagnosis not present

## 2024-11-04 DIAGNOSIS — M6281 Muscle weakness (generalized): Secondary | ICD-10-CM

## 2024-11-04 DIAGNOSIS — R2681 Unsteadiness on feet: Secondary | ICD-10-CM

## 2024-11-04 NOTE — Therapy (Signed)
 " OUTPATIENT PHYSICAL THERAPY  LOWER EXTREMITY ONCOLOGY TREATMENT  Patient Name: Tracy [name] MRN: [id number] DOB:02/21/1946, 79 y.o., female Today's Date: 11/04/2024  END OF SESSION:  PT End of Session - 11/04/24 1612     Visit Number 5    Number of Visits 25    Date for Recertification  01/06/25    PT Start Time 1500    PT Stop Time 1605    PT Time Calculation (min) 65 min    Activity Tolerance Patient tolerated treatment well    Behavior During Therapy [facility] for tasks assessed/performed            Past Medical History:  Diagnosis Date   Allergic rhinitis    Allergy    Anxiety    Arthritis    Ascending aorta dilation 09/13/2022   Echo 09/12/2022: EF 50-55, GR 1 DD, mildly reduced RVSF, normal PASP (RVSP 17.7), mild dilation of ascending aorta (38 mm), RAP 3   Breast cancer (HCC) 04/28/2020   rigtht breast   Bronchitis    CAD (coronary artery disease)    1/19 PCI/DES to pLCX for ISR, normal EF.    Cataract    Cervical dysplasia    unsure of procedure, possible burning in her late 28s   CHF (congestive heart failure) (HCC)    Echo 06/2019: EF 55-60, elevated LVEDP, normal RV SF, mild MAC, mild MR, trivial TR   Complication of anesthesia    COPD (chronic obstructive pulmonary disease) (HCC)    early    Depression    GERD (gastroesophageal reflux disease)    Gout    Heart murmur    Hyperlipidemia    Hypertension    Low back pain    Myocardial infarction (HCC)    Overactive bladder    PONV (postoperative nausea and vomiting)    Sleep apnea    Past Surgical History:  Procedure Laterality Date   ABDOMINAL HYSTERECTOMY     Partial   ANGIOPLASTY     stent 2007   BREAST EXCISIONAL BIOPSY Right 04/2020   ADH/LCIS   BREAST LUMPECTOMY WITH RADIOACTIVE SEED LOCALIZATION Right 04/28/2020   Procedure: RIGHT BREAST LUMPECTOMY X 2  WITH RADIOACTIVE SEED LOCALIZATION;  Surgeon: Belinda Cough, MD;  Location: [facility];  Service: General;   Laterality: Right;  LMA   BREAST SURGERY     CARDIAC CATHETERIZATION     COLONOSCOPY     CORONARY STENT INTERVENTION N/A 11/06/2017   Procedure: CORONARY STENT INTERVENTION;  Surgeon: Mady Bruckner, MD;  Location: [facility];  Service: Cardiovascular;  Laterality: N/A;   EYE SURGERY     LEFT HEART CATH AND CORONARY ANGIOGRAPHY N/A 11/06/2017   Procedure: LEFT HEART CATH AND CORONARY ANGIOGRAPHY;  Surgeon: Mady Bruckner, MD;  Location: [facility];  Service: Cardiovascular;  Laterality: N/A;   LEFT HEART CATH AND CORONARY ANGIOGRAPHY N/A 07/24/2019   Procedure: LEFT HEART CATH AND CORONARY ANGIOGRAPHY;  Surgeon: Verlin Bruckner BIRCH, MD;  Location: [facility];  Service: Cardiovascular;  Laterality: N/A;   RIGHT/LEFT HEART CATH AND CORONARY ANGIOGRAPHY N/A 09/29/2020   Procedure: RIGHT/LEFT HEART CATH AND CORONARY ANGIOGRAPHY;  Surgeon: Claudene Victory ORN, MD;  Location: [facility];  Service: Cardiovascular;  Laterality: N/A;   ROBOTIC ASSISTED BILATERAL SALPINGO OOPHERECTOMY Bilateral 11/10/2020   Procedure: XI ROBOTIC ASSISTED BILATERAL SALPINGO OOPHORECTOMY WITH MINI LAPAROTOMY FOR DRAINAGE;  Surgeon: Viktoria Comer SAUNDERS, MD;  Location: [facility];  Service: Gynecology;  Laterality:  Bilateral;  MINI LAP FIRST   TONSILLECTOMY     VULVECTOMY N/A 11/10/2020   Procedure: WIDE EXCISION VULVECTOMY;  Surgeon: Viktoria Comer SAUNDERS, MD;  Location: [facility];  Service: Gynecology;  Laterality: N/A;   Patient Active Problem List   Diagnosis Date Noted   PAD (peripheral artery disease) 04/29/2024   Weakness 10/05/2023   Bronchopneumonia 10/05/2023   Multifocal pneumonia 09/29/2023   Hypokalemia 08/07/2023   Intertrigo 04/18/2023   Venous stasis dermatitis of both lower extremities 11/28/2022   Ascending aorta dilation 09/13/2022   Dizziness 01/19/2022   Frequent falls 01/19/2022   Tremor of both hands 08/16/2021   Aortic atherosclerosis 06/15/2021   Generalized  osteoarthritis of multiple sites 12/25/2020   Chronic pain disorder 12/25/2020   Pelvic mass in female    Bilateral lower extremity edema 04/13/2020   Shortness of breath    Anxiety disorder 10/29/2018   CKD (chronic kidney disease), stage III (HCC) 06/27/2018   COPD (chronic obstructive pulmonary disease) (HCC) 05/25/2018   Obesity 03/20/2018   Dyspnea on exertion 11/06/2017   Abnormal stress test 11/06/2017   Chronic heart failure with preserved ejection fraction (HCC) 09/04/2017   Left ventricular dysfunction 07/31/2017   Cataract, nuclear sclerotic senile, bilateral 02/01/2017   Gouty arthritis of toe of left foot 08/09/2016   Hyperuricemia 08/09/2016   Osteoarthritis (arthritis due to wear and tear of joints) 01/14/2014   Atherosclerosis of native coronary artery of native heart with angina pectoris 08/02/2011   Mitral regurgitation 08/02/2011   OVERACTIVE BLADDER 07/02/2010   ACUTE CYSTITIS 05/25/2010   Mixed hyperlipidemia 08/11/2009   Leg edema, left 07/22/2009   GERD 04/11/2008   Constipation 04/11/2008   Essential hypertension 05/30/2007   MYOCARDIAL INFARCTION, HX OF 05/30/2007   Allergic rhinitis 05/30/2007   LOW BACK PAIN 05/30/2007     REFERRING PROVIDER: Betty Jordan, MD  REFERRING DIAG: I89.0  THERAPY DIAG:  Lymphedema, not elsewhere classified  Unsteadiness on feet  Muscle weakness (generalized)  Other low back pain  Repeated falls  ONSET DATE: unknown  Rationale for Evaluation and Treatment: Rehabilitation  SUBJECTIVE:                                                                                                                                                                                           SUBJECTIVE STATEMENT: It was getting really painful on the back of the left leg.  I took the bandages and the foam off the leg but kept it on the foot.   PERTINENT HISTORY: HTN, CAD, COPD, OA, CKD stage 3, Controlled CHF but does have some soft  pitting edema bilaterally. 11/10/20:  Robotic BSO and removal of left labial mass and fallopian tube 18cm cystic mass, pathology showed serous cystadenoma and benign hidradenoma papilliferum. Size difference noted even back in 2020 with negative doppler for DVT. Recurrent cellultis in the leg.   PAIN:  Are you having pain? Forgot to ask for a number but pt reports both legs were hurting less today than at last session  PRECAUTIONS: Recurrent infections on the Lt leg, not cured by antibiotics multiple times.   RED FLAGS: None   WEIGHT BEARING RESTRICTIONS: No  FALLS:  Has patient fallen in last 6 months? Yes. Number of falls 4-5 I was walking around the house I can't go upstairs anymore.   I was walking and tripped   LIVING ENVIRONMENT: Lives with: lives with their family and lives with their spouse Lives in: House/apartment Has following equipment at home: Environmental [employer] - 4 wheeled and SPC , bedside commode, Using baby wipes for showering Can get dressed ind but difficult Sleeping in a chair  OCCUPATION: retired   LEISURE: not addressed   PRIOR LEVEL OF FUNCTION: Independent with basic ADLs - see living environment   PATIENT GOALS: improve legs and mobility    OBJECTIVE: Note: Objective measures were completed at Evaluation unless otherwise noted.  COGNITION: Overall cognitive status: Within functional limits for tasks assessed   PALPATION: Deferred due to leg status  OBSERVATIONS / OTHER ASSESSMENTS: Lt LE is larger than Rt up to groin.  Rt leg has edema evident mostly around the ankle with indentions from her shoe and mild redness. She has an ace wrap around the ankle and lower leg, The Lt leg Lt is a brighter red color.  Various very wet bandages on the lateral and posterior leg that are covering up blisters and that are essentially stuck on to the skin due to time on the leg.  White covering over the legs that pt reports is neosporin and desitin.  Ace wrap over the bandages.   Wearing wet sock and shoe with a harsh odor of exudate.  Also some thick scaly hyperkeratosis on the front of the left leg and some lymphorrhea bumps and skin thickening on the back of the leg. No open places noted, but scabbing over the original wound site and various red blisters that have popped.              Prior Photo from June  Photo from 10/14/24   Photos from 10/30/24         LYMPHEDEMA ASSESSMENTS:  LOWER EXTREMITY LANDMARK RIGHT 04/15/24 10/14/24  At groin     30 cm proximal to suprapatella     20 cm proximal to suprapatella     10 cm proximal to suprapatella     At midpatella / popliteal crease     30 cm proximal to floor at lateral plantar foot 39   20 cm proximal to floor at lateral plantar foot 29.5   10 cm proximal to floor at lateral plantar foot 28.3   Circumference of ankle/heel     5 cm proximal to 1st MTP joint 23.3   Across MTP joint 23.2   Around proximal great toe 8.0   (Blank rows = not tested)   LOWER EXTREMITY LANDMARK LEFT 04/12/24 In supine today 04/15/24 10/30/2024  At groin       30 cm proximal to suprapatella       20 cm proximal to suprapatella       10 cm proximal to suprapatella 52.2  At midpatella / popliteal crease 43.2     30 cm proximal to floor at lateral plantar foot 44.6 43.5 40.6  20 cm proximal to floor at lateral plantar foot 40.5 40.2 36.7  10 cm proximal to floor at lateral plantar foot 31.6 31.4 30.9  Circumference of ankle/heel       5 cm proximal to 1st MTP joint 23.4 23.3 24.4  Across MTP joint 22.7 23 23.5  Around proximal great toe 7.6 8.0 7.9  (Blank rows = not tested)  GAIT: Distance walked: walks with rollator walker, slow gait  Outcome measure:  Extreme difficulty/unable (0), Quite a bit of difficulty (1), Moderate difficulty (2), Little difficulty (3), No difficulty (4) Survey date:  10/14/24  Any of your usual work, housework or school activities 1  2. Usual hobbies, recreational or sporting activities 1   3. Getting into/out of the bath 0  4. Walking between rooms 1  5. Putting on socks/shoes 1  6. Squatting  0  7. Lifting an object, like a bag of groceries from the floor 0  8. Performing light activities around your home 0  9. Performing heavy activities around your home 0  10. Getting into/out of a car 1  11. Walking 2 blocks 0  12. Walking 1 mile 0  13. Going up/down 10 stairs (1 flight) 0  14. Standing for 1 hour 0  15.  sitting for 1 hour 1  16. Running on even ground 0  17. Running on uneven ground 0  18. Making sharp turns while running fast 0  19. Hopping  0  20. Rolling over in bed 0  Score total:  6/80    ABC scale The Activities-Specific Balance Confidence (ABC) Scale 0% 10 20 30  40 50 60 70 80 90 100% No confidence<->completely confident  How confident are you that you will not lose your balance or become unsteady when you . . .   Date tested 10/14/24  Walk around the house 40%  2. Walk up or down stairs 0%  3. Bend over and pick up a slipper from in front of a closet floor 10%  4. Reach for a small can off a shelf at eye level 10%  5. Stand on tip toes and reach for something above your head 0%  6. Stand on a chair and reach for something 0%  7. Sweep the floor 0%  8. Walk outside the house to a car parked in the driveway 10%  9. Get into or out of a car 10%  10. Walk across a parking lot to the mall 0%  11. Walk up or down a ramp 0%  12. Walk in a crowded mall where people rapidly walk past you 10%  13. Are bumped into by people as you walk through the mall 10%  14. Step onto or off of an escalator while you are holding onto the railing 10%  15. Step onto or off an escalator while holding onto parcels such that you cannot hold onto the railing 0%  16. Walk outside on icy sidewalks 0%  Total: #/16 6.8% confidence in balance  TREATMENT DATE:  11/04/24: Manual Therapy and Application of bandages  Removed bandages and washed legs assessing skin. Let legs breathe and move without bandaging.  Stood up to assess back of left leg and took a photo to show pt and husband.  Large open red blister is shrinking in size but still painful.  Applied wound care coverings  - nonstick under both tg soft  Compression Bandaging as follows with cocoa butter applied to both LE's:  Rt LE: Non adhesive large gauze over open blister on shin, ABD pad over that and at dorsum of foot covering lateral malleolous where pt also reports tenderness and has increased hyperkeratosis; TG soft from foot to knee, Mollelast to toe 1-4 folding bandage in half for toes 3 and 4, and then Artiflex from foot to knee.  Short stretch compression bandages: 6 cm Roman Sandal to foot into ASH 12 cm from ankle to knee Lt LE: Non adhesive bandage x 1 large size covering posterior thigh blister, with no ABD pad due to no drainage after last time.   TG large from foot to knee and Mollelast to toe 1-4 folding bandage in half for toes 3 and 4, and then Artiflex from foot to knee. - added another roll for more padding and omitted foam.  Short stretch compression bandages: Added 1/4 gray foam to dorsal foot and fixated with 6 cm 6 cm Roman sandal and and ASH pattern combined 12 cm heel lock and then spiral to knee Folded stockinette back over top of both bandages and put surgical booties on bil feet Pt reports bandages both comfortable at end of session.   11/01/24: Manual Therapy and Self Care Removed bandages and washed legs assessing skin. Applied wound care coverings   Compression Bandaging as follows with cocoa butter applied to both LE's:  Rt LE: Non adhesive x 2 gauze over open blister on shin, ABD pad over that and at dorsum of foot covering lateral malleolous where pt also reports tenderness and has increased hyperkeratosis; TG soft from foot to knee, Mollelast  to toe 1-4 folding bandage in half for toes 3 and 4, and then Artiflex from foot to knee.  Short stretch compression bandages: 6 cm Roman Sandal to foot 8 cm ASH pattern to foot/ankle and finished some of bandage up lower leg 12 cm from ankle to knee Lt LE: Non adhesive bandage x 3 covering posterior thigh blister, with no ABD pad due to no drainage after last time.   TG soft from foot to knee and Mollelast to toe 1-4 folding bandage in half for toes 3 and 4, and then Artiflex from foot to knee. Short stretch compression bandages: Added 1/4 gray foam to dorsal foot and fixated with 6 cm 6 cm Roman sandal and and ASH pattern combined 12 cm heel lock and then spiral to knee Folded stockinette back over top of both bandages and put surgical booties on bil feet Pt reports bandages both comfortable at end of session.   10/30/24: Manual Therapy and Self Care Removed bandages and washed legs assessing skin. Open blisters are still very red but not bleeding as much as they were at last session, especially the Lt which is the worse LE. Her sensitivity was some improved, though overall still very tender to touch. The nonadhesive gauze came off much easier today than her bandages at last session so continued with use of this and ABD pads. There was drainage from both sores but reasonbly so for open, healing blisters.  No increase redness or warmth noted. She does still have an intact, smaller blister at her Rt lower leg that may open under bandages.  Compression Bandaging as follows with cocoa butter applied to both LE's:  Rt LE: Non adhesive x 2 gauze over open blister on shin, ABD pad over that and at dorsum of foot covering lateral malleolous where pt also reports tenderness and has increased hyperkeratosis; TG soft from foot to knee, Mollelast to toe 1-4 folding bandage in half for toes 3 and 4, and then Artiflex from foot to knee.  Short stretch compression bandages: 6 cm Roman Sandal to foot 8 cm ASH  pattern to foot/ankle and finished some of bandage up lower leg 12 cm from ankle to knee Lt LE: Non adhesive bandage x 3 covering posterior thigh blister, ABD pad to ant and post leg and taped these together to limit rubbing when pulling TG soft on TG soft from foot to knee and Mollelast to toe 1-4 folding bandage in half for toes 3 and 4, and then Artiflex from foot to knee. Short stretch compression bandages: Added 1/4 gray foam to dorsal foot and fixated with 6 cm 6 cm Roman sandal and and ASH pattern combined 12 cm heel lock and then spiral to knee Folded stockinette back over top of both bandages and put surgical booties on bil feet Pt reports bandages both comfortable at end of session.   10/28/24: Manual Therapy Spent session removing pts bandages that she had applied at home since eval here on 12/29. Washed legs with soap and warm water  as able. Had to avoid large open blisters that were bleeding: one at Rt anterior lower leg and other, more painful one, at Lt posterior thigh. Just squeezed fresh water  from towel over sores to allow for some cleaning and patted dry to tolerance. Applied cocoa butter to both legs, again avoiding open areas Compression Bandaging as follows:  Rt LE: Non adhesive x 2 gauze over open blister on shin, ABD pad over that and at dorsum of foot covering lateral malleolous where pt also reports tenderness and has increased hyperkeratosis; TG soft from foot to knee, Mollelast to toe 1-4 folding bandage in half for toes 3 and 4.  Short stretch compression bandages: 6 cm Roman Sandal to foot 8 cm ASH pattern to foot/ankle and took up lower leg 12 cm from ankle to knee Lt LE: Non adhesive bandage x 3 covering posterior thigh, ABD pabd to ant and post leg and taped these together to limit rubbing when pulling TG soft on TG soft from foot to knee and Mollelast to toe 1-4 folding bandage in half for toes 3 and 4. Short stretch compression bandages: 6 cm Roman sandal and  and ASH pattern combined 12 cm heel lock and then spiral to knee Folded stockinette back over top of both bandages and put surgical booties on bil feet Pt reports bandages both comfortable at end of session.   PATIENT EDUCATION:  Education details: Aspects of CDT, POC, Person educated: Patient Education method: Explanation Education comprehension: verbalized understanding  HOME EXERCISE PROGRAM: Use her velcro garment from wound care center, try to sleep out of the recliner , keep leg clean and dry  ASSESSMENT:  CLINICAL IMPRESSION: Skin re assessed after removal of bandages.  Rt leg blister has partially popped with some drainage on the gauze.  The leg is very small with wrinkly skin at this time so decreased bandages to two.  The Left leg is improving  greatly and the size of the open blister on the calf is decreasing in size but still has some adherence of the gauze to the blister bed but removed easily with some water .  Discussed sitting with less pressure on the left calf to help with wound healing.   OBJECTIVE IMPAIRMENTS: decreased knowledge of condition, decreased knowledge of use of DME, decreased mobility, and increased edema.   ACTIVITY LIMITATIONS: locomotion level  PARTICIPATION LIMITATIONS: community activity  PERSONAL FACTORS: Time since onset of injury/illness/exacerbation and 1-2 comorbidities: CHF, CKD are also affecting patient's functional outcome.   REHAB POTENTIAL: Fair  CLINICAL DECISION MAKING: high complexity   EVALUATION COMPLEXITY: High   GOALS: Goals reviewed with patient? Yes  SHORT TERM GOALS: Target date: 11/08/24  Pt will be educated on what lymphedema is and how it is best managed  Baseline: Goal status: INITIAL  2.  Pt will be educated on risk reduction and infection prevention including skin care Baseline:  Goal status: INITIAL   LONG TERM GOALS: Target date: 01/06/25  Pt will be ind with final compression for bil lower extremities   Baseline:  Goal status: INITIAL  2.  Pt will be educated on pump and pursue a trial if wanted Baseline:  Goal status: INITIAL  3.  Pt will be ind with remedial exercise  Baseline:  Goal status: INITIAL  4. Pt will return to no open wounds on the Lt lower leg  INITIAL   PLAN:  PT FREQUENCY: 3x/week  PT DURATION: 8 weeks   PLANNED INTERVENTIONS: 97110-Therapeutic exercises, 97530- Therapeutic activity, 97112- Neuromuscular re-education, 97140- Manual therapy, Patient/Family education, Manual lymph drainage, Compression bandaging, DME instructions, and Self Care  PLAN FOR NEXT SESSION: Did she get mammogram scheduled? Cont with above including CDT Lt lower leg with need for wound care, and try application of velcro to Rt lower leg as her swelling here is less and will be manageable with velcro; cont washing of the leg and foot, emphasize cleaning of leg at home, clean socks and shoes.   [place]?  Larue Saddie SAUNDERS, PT 11/04/2024, 4:14 PM  "

## 2024-11-06 ENCOUNTER — Ambulatory Visit

## 2024-11-06 DIAGNOSIS — R2681 Unsteadiness on feet: Secondary | ICD-10-CM

## 2024-11-06 DIAGNOSIS — M6281 Muscle weakness (generalized): Secondary | ICD-10-CM

## 2024-11-06 DIAGNOSIS — I890 Lymphedema, not elsewhere classified: Secondary | ICD-10-CM | POA: Diagnosis not present

## 2024-11-06 NOTE — Therapy (Signed)
 " OUTPATIENT PHYSICAL THERAPY  LOWER EXTREMITY ONCOLOGY TREATMENT  Patient Name: Tracy [name] MRN: [id number] DOB:12-10-1945, 79 y.o., female Today's Date: 11/06/2024  END OF SESSION:  PT End of Session - 11/06/24 1623     Visit Number 6    Number of Visits 25    Date for Recertification  01/06/25    Authorization Type none    Progress Note Due on Visit 10    PT Start Time 1503    PT Stop Time 1603    PT Time Calculation (min) 60 min    Activity Tolerance Patient tolerated treatment well    Behavior During Therapy [facility] for tasks assessed/performed            Past Medical History:  Diagnosis Date   Allergic rhinitis    Allergy    Anxiety    Arthritis    Ascending aorta dilation 09/13/2022   Echo 09/12/2022: EF 50-55, GR 1 DD, mildly reduced RVSF, normal PASP (RVSP 17.7), mild dilation of ascending aorta (38 mm), RAP 3   Breast cancer (HCC) 04/28/2020   rigtht breast   Bronchitis    CAD (coronary artery disease)    1/19 PCI/DES to pLCX for ISR, normal EF.    Cataract    Cervical dysplasia    unsure of procedure, possible burning in her late 28s   CHF (congestive heart failure) (HCC)    Echo 06/2019: EF 55-60, elevated LVEDP, normal RV SF, mild MAC, mild MR, trivial TR   Complication of anesthesia    COPD (chronic obstructive pulmonary disease) (HCC)    early    Depression    GERD (gastroesophageal reflux disease)    Gout    Heart murmur    Hyperlipidemia    Hypertension    Low back pain    Myocardial infarction (HCC)    Overactive bladder    PONV (postoperative nausea and vomiting)    Sleep apnea    Past Surgical History:  Procedure Laterality Date   ABDOMINAL HYSTERECTOMY     Partial   ANGIOPLASTY     stent 2007   BREAST EXCISIONAL BIOPSY Right 04/2020   ADH/LCIS   BREAST LUMPECTOMY WITH RADIOACTIVE SEED LOCALIZATION Right 04/28/2020   Procedure: RIGHT BREAST LUMPECTOMY X 2  WITH RADIOACTIVE SEED LOCALIZATION;  Surgeon: Belinda Cough, MD;   Location: [facility];  Service: General;  Laterality: Right;  LMA   BREAST SURGERY     CARDIAC CATHETERIZATION     COLONOSCOPY     CORONARY STENT INTERVENTION N/A 11/06/2017   Procedure: CORONARY STENT INTERVENTION;  Surgeon: Mady Bruckner, MD;  Location: [facility];  Service: Cardiovascular;  Laterality: N/A;   EYE SURGERY     LEFT HEART CATH AND CORONARY ANGIOGRAPHY N/A 11/06/2017   Procedure: LEFT HEART CATH AND CORONARY ANGIOGRAPHY;  Surgeon: Mady Bruckner, MD;  Location: [facility];  Service: Cardiovascular;  Laterality: N/A;   LEFT HEART CATH AND CORONARY ANGIOGRAPHY N/A 07/24/2019   Procedure: LEFT HEART CATH AND CORONARY ANGIOGRAPHY;  Surgeon: Verlin Bruckner BIRCH, MD;  Location: [facility];  Service: Cardiovascular;  Laterality: N/A;   RIGHT/LEFT HEART CATH AND CORONARY ANGIOGRAPHY N/A 09/29/2020   Procedure: RIGHT/LEFT HEART CATH AND CORONARY ANGIOGRAPHY;  Surgeon: Claudene Victory ORN, MD;  Location: [facility];  Service: Cardiovascular;  Laterality: N/A;   ROBOTIC ASSISTED BILATERAL SALPINGO OOPHERECTOMY Bilateral 11/10/2020   Procedure: XI ROBOTIC ASSISTED BILATERAL SALPINGO OOPHORECTOMY WITH MINI LAPAROTOMY FOR DRAINAGE;  Surgeon: Viktoria Comer SAUNDERS, MD;  Location: [facility];  Service: Gynecology;  Laterality: Bilateral;  MINI LAP FIRST   TONSILLECTOMY     VULVECTOMY N/A 11/10/2020   Procedure: WIDE EXCISION VULVECTOMY;  Surgeon: Viktoria Comer SAUNDERS, MD;  Location: [facility];  Service: Gynecology;  Laterality: N/A;   Patient Active Problem List   Diagnosis Date Noted   PAD (peripheral artery disease) 04/29/2024   Weakness 10/05/2023   Bronchopneumonia 10/05/2023   Multifocal pneumonia 09/29/2023   Hypokalemia 08/07/2023   Intertrigo 04/18/2023   Venous stasis dermatitis of both lower extremities 11/28/2022   Ascending aorta dilation 09/13/2022   Dizziness 01/19/2022   Frequent falls 01/19/2022   Tremor of both hands 08/16/2021    Aortic atherosclerosis 06/15/2021   Generalized osteoarthritis of multiple sites 12/25/2020   Chronic pain disorder 12/25/2020   Pelvic mass in female    Bilateral lower extremity edema 04/13/2020   Shortness of breath    Anxiety disorder 10/29/2018   CKD (chronic kidney disease), stage III (HCC) 06/27/2018   COPD (chronic obstructive pulmonary disease) (HCC) 05/25/2018   Obesity 03/20/2018   Dyspnea on exertion 11/06/2017   Abnormal stress test 11/06/2017   Chronic heart failure with preserved ejection fraction (HCC) 09/04/2017   Left ventricular dysfunction 07/31/2017   Cataract, nuclear sclerotic senile, bilateral 02/01/2017   Gouty arthritis of toe of left foot 08/09/2016   Hyperuricemia 08/09/2016   Osteoarthritis (arthritis due to wear and tear of joints) 01/14/2014   Atherosclerosis of native coronary artery of native heart with angina pectoris 08/02/2011   Mitral regurgitation 08/02/2011   OVERACTIVE BLADDER 07/02/2010   ACUTE CYSTITIS 05/25/2010   Mixed hyperlipidemia 08/11/2009   Leg edema, left 07/22/2009   GERD 04/11/2008   Constipation 04/11/2008   Essential hypertension 05/30/2007   MYOCARDIAL INFARCTION, HX OF 05/30/2007   Allergic rhinitis 05/30/2007   LOW BACK PAIN 05/30/2007     REFERRING PROVIDER: Betty Jordan, MD  REFERRING DIAG: I89.0  THERAPY DIAG:  Lymphedema, not elsewhere classified  Unsteadiness on feet  Muscle weakness (generalized)  ONSET DATE: unknown  Rationale for Evaluation and Treatment: Rehabilitation  SUBJECTIVE:                                                                                                                                                                                           SUBJECTIVE STATEMENT: The Lt leg started to feel tight at the toes and the top of the bandage. I didn't call about the mammogram yet because I can't ind the letter they sent me with the phone number. Can you help me with  that?  PERTINENT HISTORY:  HTN, CAD, COPD, OA, CKD stage 3, Controlled CHF but does have some soft pitting edema bilaterally. 11/10/20: Robotic BSO and removal of left labial mass and fallopian tube 18cm cystic mass, pathology showed serous cystadenoma and benign hidradenoma papilliferum. Size difference noted even back in 2020 with negative doppler for DVT. Recurrent cellultis in the leg.   PAIN:  Are you having pain? Forgot to ask for a number but pt reports both legs were hurting less today than at last session  PRECAUTIONS: Recurrent infections on the Lt leg, not cured by antibiotics multiple times.   RED FLAGS: None   WEIGHT BEARING RESTRICTIONS: No  FALLS:  Has patient fallen in last 6 months? Yes. Number of falls 4-5 I was walking around the house I can't go upstairs anymore.   I was walking and tripped   LIVING ENVIRONMENT: Lives with: lives with their family and lives with their spouse Lives in: House/apartment Has following equipment at home: [employer] - 4 wheeled and SPC , bedside commode, Using baby wipes for showering Can get dressed ind but difficult Sleeping in a chair  OCCUPATION: retired   LEISURE: not addressed   PRIOR LEVEL OF FUNCTION: Independent with basic ADLs - see living environment   PATIENT GOALS: improve legs and mobility    OBJECTIVE: Note: Objective measures were completed at Evaluation unless otherwise noted.  COGNITION: Overall cognitive status: Within functional limits for tasks assessed   PALPATION: Deferred due to leg status  OBSERVATIONS / OTHER ASSESSMENTS: Lt LE is larger than Rt up to groin.  Rt leg has edema evident mostly around the ankle with indentions from her shoe and mild redness. She has an ace wrap around the ankle and lower leg, The Lt leg Lt is a brighter red color.  Various very wet bandages on the lateral and posterior leg that are covering up blisters and that are essentially stuck on to the skin due to time on the leg.   White covering over the legs that pt reports is neosporin and desitin.  Ace wrap over the bandages.  Wearing wet sock and shoe with a harsh odor of exudate.  Also some thick scaly hyperkeratosis on the front of the left leg and some lymphorrhea bumps and skin thickening on the back of the leg. No open places noted, but scabbing over the original wound site and various red blisters that have popped.              Prior Photo from June  Photo from 10/14/24   Photos from 10/30/24         LYMPHEDEMA ASSESSMENTS:  LOWER EXTREMITY LANDMARK RIGHT 04/15/24 10/14/24  At groin     30 cm proximal to suprapatella     20 cm proximal to suprapatella     10 cm proximal to suprapatella     At midpatella / popliteal crease     30 cm proximal to floor at lateral plantar foot 39   20 cm proximal to floor at lateral plantar foot 29.5   10 cm proximal to floor at lateral plantar foot 28.3   Circumference of ankle/heel     5 cm proximal to 1st MTP joint 23.3   Across MTP joint 23.2   Around proximal great toe 8.0   (Blank rows = not tested)   LOWER EXTREMITY LANDMARK LEFT 04/12/24 In supine today 04/15/24 10/30/2024  At groin       30 cm proximal to suprapatella  20 cm proximal to suprapatella       10 cm proximal to suprapatella 52.2     At midpatella / popliteal crease 43.2     30 cm proximal to floor at lateral plantar foot 44.6 43.5 40.6  20 cm proximal to floor at lateral plantar foot 40.5 40.2 36.7  10 cm proximal to floor at lateral plantar foot 31.6 31.4 30.9  Circumference of ankle/heel       5 cm proximal to 1st MTP joint 23.4 23.3 24.4  Across MTP joint 22.7 23 23.5  Around proximal great toe 7.6 8.0 7.9  (Blank rows = not tested)  GAIT: Distance walked: walks with rollator walker, slow gait  Outcome measure:  Extreme difficulty/unable (0), Quite a bit of difficulty (1), Moderate difficulty (2), Little difficulty (3), No difficulty (4) Survey date:  10/14/24  Any of your usual  work, housework or school activities 1  2. Usual hobbies, recreational or sporting activities 1  3. Getting into/out of the bath 0  4. Walking between rooms 1  5. Putting on socks/shoes 1  6. Squatting  0  7. Lifting an object, like a bag of groceries from the floor 0  8. Performing light activities around your home 0  9. Performing heavy activities around your home 0  10. Getting into/out of a car 1  11. Walking 2 blocks 0  12. Walking 1 mile 0  13. Going up/down 10 stairs (1 flight) 0  14. Standing for 1 hour 0  15.  sitting for 1 hour 1  16. Running on even ground 0  17. Running on uneven ground 0  18. Making sharp turns while running fast 0  19. Hopping  0  20. Rolling over in bed 0  Score total:  6/80    ABC scale The Activities-Specific Balance Confidence (ABC) Scale 0% 10 20 30  40 50 60 70 80 90 100% No confidence<->completely confident  How confident are you that you will not lose your balance or become unsteady when you . . .   Date tested 10/14/24  Walk around the house 40%  2. Walk up or down stairs 0%  3. Bend over and pick up a slipper from in front of a closet floor 10%  4. Reach for a small can off a shelf at eye level 10%  5. Stand on tip toes and reach for something above your head 0%  6. Stand on a chair and reach for something 0%  7. Sweep the floor 0%  8. Walk outside the house to a car parked in the driveway 10%  9. Get into or out of a car 10%  10. Walk across a parking lot to the mall 0%  11. Walk up or down a ramp 0%  12. Walk in a crowded mall where people rapidly walk past you 10%  13. Are bumped into by people as you walk through the mall 10%  14. Step onto or off of an escalator while you are holding onto the railing 10%  15. Step onto or off an escalator while holding onto parcels such that you cannot hold onto the railing 0%  16. Walk outside on icy sidewalks 0%  Total: #/16 6.8% confidence in balance  TREATMENT DATE:  11/06/24: Manual Therapy, Application of Bandages and Therapeutic Exercises Removed bandages and washed legs assessing skin. Let legs breathe and move without bandaging.  Had pt perform gentle ROM exs for muscle pump and strengthening: Standing with walker: Alt march, heel raises, and mini squat; then in sitting alt march, ankle pumps and LAQ's - handout issued Large open red blister is shrinking in size and much less painful, just ttp today. Applied wound care coverings  - nonstick under both tg soft to anterior Rt leg and posterior Lt Before reapplying bandages had husband come in room briefly to show him how to apply velcro bandages as he will probably need to assist pt with this over expected long weekend due to inclement weather predicted.  Compression Bandaging as follows with cocoa butter applied to both LE's:  Rt LE: Non adhesive large gauze over healing blister on shin; TG soft from foot to knee, Mollelast to toes 1-4 folding bandage in half for toes 3 and 4, and then Artiflex from foot to knee.  Short stretch compression bandages: 6 cm Roman Sandal to foot into ASH 12 cm spiral from ankle to knee  Lt LE: Non adhesive bandage x 1 large size covering posterior thigh blister, with no ABD pad due to no drainage after last time, but extra padding to posterior blister.   TG large from foot to knee and Mollelast to toe 1-4 folding bandage in half for toes 3 and 4, and then Artiflex from foot to knee.  Short stretch compression bandages: Added 1/4 gray foam to dorsal foot and fixated with 6 cm 6 cm Roman sandal and and ASH pattern combined 12 cm heel lock and then spiral to knee Folded stockinette back over top of both bandages and put surgical booties on bil feet Pt reports bandages both comfortable at end of session.  11/04/24: Manual Therapy and Application of bandages  Removed  bandages and washed legs assessing skin. Let legs breathe and move without bandaging.  Stood up to assess back of left leg and took a photo to show pt and husband.  Large open red blister is shrinking in size but still painful.  Applied wound care coverings  - nonstick under both tg soft  Compression Bandaging as follows with cocoa butter applied to both LE's:  Rt LE: Non adhesive large gauze over open blister on shin, ABD pad over that and at dorsum of foot covering lateral malleolous where pt also reports tenderness and has increased hyperkeratosis; TG soft from foot to knee, Mollelast to toe 1-4 folding bandage in half for toes 3 and 4, and then Artiflex from foot to knee.  Short stretch compression bandages: 6 cm Roman Sandal to foot into ASH 12 cm from ankle to knee Lt LE: Non adhesive bandage x 1 large size covering posterior thigh blister, with no ABD pad due to no drainage after last time.   TG large from foot to knee and Mollelast to toe 1-4 folding bandage in half for toes 3 and 4, and then Artiflex from foot to knee. - added another roll for more padding and omitted foam.  Short stretch compression bandages: Added 1/4 gray foam to dorsal foot and fixated with 6 cm 6 cm Roman sandal and and ASH pattern combined 12 cm heel lock and then spiral to knee Folded stockinette back over top of both bandages and put surgical booties on bil feet Pt reports bandages both comfortable at end of session.   11/01/24: Manual  Therapy and Self Care Removed bandages and washed legs assessing skin. Applied wound care coverings   Compression Bandaging as follows with cocoa butter applied to both LE's:  Rt LE: Non adhesive x 2 gauze over open blister on shin, ABD pad over that and at dorsum of foot covering lateral malleolous where pt also reports tenderness and has increased hyperkeratosis; TG soft from foot to knee, Mollelast to toe 1-4 folding bandage in half for toes 3 and 4, and then Artiflex from foot  to knee.  Short stretch compression bandages: 6 cm Roman Sandal to foot 8 cm ASH pattern to foot/ankle and finished some of bandage up lower leg 12 cm from ankle to knee Lt LE: Non adhesive bandage x 3 covering posterior thigh blister, with no ABD pad due to no drainage after last time.   TG soft from foot to knee and Mollelast to toe 1-4 folding bandage in half for toes 3 and 4, and then Artiflex from foot to knee. Short stretch compression bandages: Added 1/4 gray foam to dorsal foot and fixated with 6 cm 6 cm Roman sandal and and ASH pattern combined 12 cm heel lock and then spiral to knee Folded stockinette back over top of both bandages and put surgical booties on bil feet Pt reports bandages both comfortable at end of session.     PATIENT EDUCATION:  Education details: Bil LE exs in sitting and standing Person educated: Patient Education method: Explanation, demonstration, and handout issued Education comprehension: verbalized understanding, returned demo and will benefit from review  HOME EXERCISE PROGRAM: Use her velcro garment from wound care center, try to sleep out of the recliner , keep leg clean and dry Bil LE remedial exs in sitting and standing: Alt march, LAQ's, heel raises and ankle pumps, mini squats with walker  ASSESSMENT:  CLINICAL IMPRESSION: Skin reassessed after removal of bandages and washing of leg. Pts blisters cont to heal very well. Rt blister no longer has exposed red tissue showing, and Lt is much smaller. Both are also much less painful. Continued with compression bandaging and instructed husband in velcro garment application briefly before applying bandages to show him how to do this for pt at home, see above. Pt needed help getting phone number for mammogram. She thinks it was [facility] so issued phone number for that and messaged her GYN as well, Kieth Carolin, in case it was somewhere else. Also updated her about new spots on breast which  has us  encouraging her to get mammogram ASAP.   OBJECTIVE IMPAIRMENTS: decreased knowledge of condition, decreased knowledge of use of DME, decreased mobility, and increased edema.   ACTIVITY LIMITATIONS: locomotion level  PARTICIPATION LIMITATIONS: community activity  PERSONAL FACTORS: Time since onset of injury/illness/exacerbation and 1-2 comorbidities: CHF, CKD are also affecting patient's functional outcome.   REHAB POTENTIAL: Fair  CLINICAL DECISION MAKING: high complexity   EVALUATION COMPLEXITY: High   GOALS: Goals reviewed with patient? Yes  SHORT TERM GOALS: Target date: 11/08/24  Pt will be educated on what lymphedema is and how it is best managed  Baseline: Goal status: INITIAL  2.  Pt will be educated on risk reduction and infection prevention including skin care Baseline:  Goal status: INITIAL   LONG TERM GOALS: Target date: 01/06/25  Pt will be ind with final compression for bil lower extremities  Baseline:  Goal status: INITIAL  2.  Pt will be educated on pump and pursue a trial if wanted Baseline:  Goal status:  INITIAL  3.  Pt will be ind with remedial exercise  Baseline:  Goal status: INITIAL  4. Pt will return to no open wounds on the Lt lower leg  INITIAL   PLAN:  PT FREQUENCY: 3x/week  PT DURATION: 8 weeks   PLANNED INTERVENTIONS: 97110-Therapeutic exercises, 97530- Therapeutic activity, 97112- Neuromuscular re-education, 97140- Manual therapy, Patient/Family education, Manual lymph drainage, Compression bandaging, DME instructions, and Self Care  PLAN FOR NEXT SESSION: Did she get mammogram scheduled? Cont with bandaging bil lower legs with need for wound care prn, and try application of velcro to Rt lower leg as her swelling here is less and will be manageable with velcro once ready; cont washing of the legs and feet.  [place]?  Aden Berwyn Caldron, PTA 11/06/2024, 5:24 PM  "

## 2024-11-07 ENCOUNTER — Other Ambulatory Visit: Payer: Self-pay | Admitting: Family Medicine

## 2024-11-07 DIAGNOSIS — Z1231 Encounter for screening mammogram for malignant neoplasm of breast: Secondary | ICD-10-CM

## 2024-11-08 ENCOUNTER — Encounter: Payer: Self-pay | Admitting: Rehabilitation

## 2024-11-08 ENCOUNTER — Ambulatory Visit: Admitting: Rehabilitation

## 2024-11-08 DIAGNOSIS — I890 Lymphedema, not elsewhere classified: Secondary | ICD-10-CM | POA: Diagnosis not present

## 2024-11-08 DIAGNOSIS — R2681 Unsteadiness on feet: Secondary | ICD-10-CM

## 2024-11-08 DIAGNOSIS — M6281 Muscle weakness (generalized): Secondary | ICD-10-CM

## 2024-11-08 NOTE — Therapy (Signed)
 " OUTPATIENT PHYSICAL THERAPY  LOWER EXTREMITY ONCOLOGY TREATMENT  Patient Name: Tracy [name] MRN: [id number] DOB:March 26, 1946, 79 y.o., female Today's Date: 11/08/2024  END OF SESSION:  PT End of Session - 11/08/24 1147     Visit Number 7    Number of Visits 25    Date for Recertification  01/06/25    PT Start Time 1045    PT Stop Time 1145    PT Time Calculation (min) 60 min    Activity Tolerance Patient tolerated treatment well    Behavior During Therapy [facility] for tasks assessed/performed            Past Medical History:  Diagnosis Date   Allergic rhinitis    Allergy    Anxiety    Arthritis    Ascending aorta dilation 09/13/2022   Echo 09/12/2022: EF 50-55, GR 1 DD, mildly reduced RVSF, normal PASP (RVSP 17.7), mild dilation of ascending aorta (38 mm), RAP 3   Breast cancer (HCC) 04/28/2020   rigtht breast   Bronchitis    CAD (coronary artery disease)    1/19 PCI/DES to pLCX for ISR, normal EF.    Cataract    Cervical dysplasia    unsure of procedure, possible burning in her late 76s   CHF (congestive heart failure) (HCC)    Echo 06/2019: EF 55-60, elevated LVEDP, normal RV SF, mild MAC, mild MR, trivial TR   Complication of anesthesia    COPD (chronic obstructive pulmonary disease) (HCC)    early    Depression    GERD (gastroesophageal reflux disease)    Gout    Heart murmur    Hyperlipidemia    Hypertension    Low back pain    Myocardial infarction (HCC)    Overactive bladder    PONV (postoperative nausea and vomiting)    Sleep apnea    Past Surgical History:  Procedure Laterality Date   ABDOMINAL HYSTERECTOMY     Partial   ANGIOPLASTY     stent 2007   BREAST EXCISIONAL BIOPSY Right 04/2020   ADH/LCIS   BREAST LUMPECTOMY WITH RADIOACTIVE SEED LOCALIZATION Right 04/28/2020   Procedure: RIGHT BREAST LUMPECTOMY X 2  WITH RADIOACTIVE SEED LOCALIZATION;  Surgeon: Belinda Cough, MD;  Location: [facility];  Service: General;   Laterality: Right;  LMA   BREAST SURGERY     CARDIAC CATHETERIZATION     COLONOSCOPY     CORONARY STENT INTERVENTION N/A 11/06/2017   Procedure: CORONARY STENT INTERVENTION;  Surgeon: Mady Bruckner, MD;  Location: [facility];  Service: Cardiovascular;  Laterality: N/A;   EYE SURGERY     LEFT HEART CATH AND CORONARY ANGIOGRAPHY N/A 11/06/2017   Procedure: LEFT HEART CATH AND CORONARY ANGIOGRAPHY;  Surgeon: Mady Bruckner, MD;  Location: [facility];  Service: Cardiovascular;  Laterality: N/A;   LEFT HEART CATH AND CORONARY ANGIOGRAPHY N/A 07/24/2019   Procedure: LEFT HEART CATH AND CORONARY ANGIOGRAPHY;  Surgeon: Verlin Bruckner BIRCH, MD;  Location: [facility];  Service: Cardiovascular;  Laterality: N/A;   RIGHT/LEFT HEART CATH AND CORONARY ANGIOGRAPHY N/A 09/29/2020   Procedure: RIGHT/LEFT HEART CATH AND CORONARY ANGIOGRAPHY;  Surgeon: Claudene Victory ORN, MD;  Location: [facility];  Service: Cardiovascular;  Laterality: N/A;   ROBOTIC ASSISTED BILATERAL SALPINGO OOPHERECTOMY Bilateral 11/10/2020   Procedure: XI ROBOTIC ASSISTED BILATERAL SALPINGO OOPHORECTOMY WITH MINI LAPAROTOMY FOR DRAINAGE;  Surgeon: Viktoria Comer SAUNDERS, MD;  Location: [facility];  Service: Gynecology;  Laterality:  Bilateral;  MINI LAP FIRST   TONSILLECTOMY     VULVECTOMY N/A 11/10/2020   Procedure: WIDE EXCISION VULVECTOMY;  Surgeon: Viktoria Comer SAUNDERS, MD;  Location: [facility];  Service: Gynecology;  Laterality: N/A;   Patient Active Problem List   Diagnosis Date Noted   PAD (peripheral artery disease) 04/29/2024   Weakness 10/05/2023   Bronchopneumonia 10/05/2023   Multifocal pneumonia 09/29/2023   Hypokalemia 08/07/2023   Intertrigo 04/18/2023   Venous stasis dermatitis of both lower extremities 11/28/2022   Ascending aorta dilation 09/13/2022   Dizziness 01/19/2022   Frequent falls 01/19/2022   Tremor of both hands 08/16/2021   Aortic atherosclerosis 06/15/2021   Generalized  osteoarthritis of multiple sites 12/25/2020   Chronic pain disorder 12/25/2020   Pelvic mass in female    Bilateral lower extremity edema 04/13/2020   Shortness of breath    Anxiety disorder 10/29/2018   CKD (chronic kidney disease), stage III (HCC) 06/27/2018   COPD (chronic obstructive pulmonary disease) (HCC) 05/25/2018   Obesity 03/20/2018   Dyspnea on exertion 11/06/2017   Abnormal stress test 11/06/2017   Chronic heart failure with preserved ejection fraction (HCC) 09/04/2017   Left ventricular dysfunction 07/31/2017   Cataract, nuclear sclerotic senile, bilateral 02/01/2017   Gouty arthritis of toe of left foot 08/09/2016   Hyperuricemia 08/09/2016   Osteoarthritis (arthritis due to wear and tear of joints) 01/14/2014   Atherosclerosis of native coronary artery of native heart with angina pectoris 08/02/2011   Mitral regurgitation 08/02/2011   OVERACTIVE BLADDER 07/02/2010   ACUTE CYSTITIS 05/25/2010   Mixed hyperlipidemia 08/11/2009   Leg edema, left 07/22/2009   GERD 04/11/2008   Constipation 04/11/2008   Essential hypertension 05/30/2007   MYOCARDIAL INFARCTION, HX OF 05/30/2007   Allergic rhinitis 05/30/2007   LOW BACK PAIN 05/30/2007     REFERRING PROVIDER: Betty Jordan, MD  REFERRING DIAG: I89.0  THERAPY DIAG:  Lymphedema, not elsewhere classified  Unsteadiness on feet  Muscle weakness (generalized)  ONSET DATE: unknown  Rationale for Evaluation and Treatment: Rehabilitation  SUBJECTIVE:                                                                                                                                                                                           SUBJECTIVE STATEMENT: They both start to feel uncomfortable at the toes eventually.  Got mammogram scheduled.    PERTINENT HISTORY: HTN, CAD, COPD, OA, CKD stage 3, Controlled CHF but does have some soft pitting edema bilaterally. 11/10/20: Robotic BSO and removal of left labial mass and  fallopian tube 18cm cystic mass, pathology showed serous cystadenoma and benign hidradenoma papilliferum.  Size difference noted even back in 2020 with negative doppler for DVT. Recurrent cellultis in the leg.   PAIN:  Are you having pain? Forgot to ask for a number but pt reports both legs were hurting less today than at last session  PRECAUTIONS: Recurrent infections on the Lt leg, not cured by antibiotics multiple times.   RED FLAGS: None   WEIGHT BEARING RESTRICTIONS: No  FALLS:  Has patient fallen in last 6 months? Yes. Number of falls 4-5 I was walking around the house I can't go upstairs anymore.   I was walking and tripped   LIVING ENVIRONMENT: Lives with: lives with their family and lives with their spouse Lives in: House/apartment Has following equipment at home: [employer] - 4 wheeled and SPC , bedside commode, Using baby wipes for showering Can get dressed ind but difficult Sleeping in a chair  OCCUPATION: retired   LEISURE: not addressed   PRIOR LEVEL OF FUNCTION: Independent with basic ADLs - see living environment   PATIENT GOALS: improve legs and mobility    OBJECTIVE: Note: Objective measures were completed at Evaluation unless otherwise noted.  COGNITION: Overall cognitive status: Within functional limits for tasks assessed   PALPATION: Deferred due to leg status  OBSERVATIONS / OTHER ASSESSMENTS: Lt LE is larger than Rt up to groin.  Rt leg has edema evident mostly around the ankle with indentions from her shoe and mild redness. She has an ace wrap around the ankle and lower leg, The Lt leg Lt is a brighter red color.  Various very wet bandages on the lateral and posterior leg that are covering up blisters and that are essentially stuck on to the skin due to time on the leg.  White covering over the legs that pt reports is neosporin and desitin.  Ace wrap over the bandages.  Wearing wet sock and shoe with a harsh odor of exudate.  Also some thick scaly  hyperkeratosis on the front of the left leg and some lymphorrhea bumps and skin thickening on the back of the leg. No open places noted, but scabbing over the original wound site and various red blisters that have popped.                Prior Photo from June  Photo from 10/14/24   Photos from 10/30/24     11/08/24    LYMPHEDEMA ASSESSMENTS:  LOWER EXTREMITY LANDMARK RIGHT 04/15/24 10/14/24  At groin     30 cm proximal to suprapatella     20 cm proximal to suprapatella     10 cm proximal to suprapatella     At midpatella / popliteal crease     30 cm proximal to floor at lateral plantar foot 39   20 cm proximal to floor at lateral plantar foot 29.5   10 cm proximal to floor at lateral plantar foot 28.3   Circumference of ankle/heel     5 cm proximal to 1st MTP joint 23.3   Across MTP joint 23.2   Around proximal great toe 8.0   (Blank rows = not tested)   LOWER EXTREMITY LANDMARK LEFT 04/12/24 In supine today 04/15/24 10/30/2024  At groin       30 cm proximal to suprapatella       20 cm proximal to suprapatella       10 cm proximal to suprapatella 52.2     At midpatella / popliteal crease 43.2     30 cm proximal to floor at lateral  plantar foot 44.6 43.5 40.6  20 cm proximal to floor at lateral plantar foot 40.5 40.2 36.7  10 cm proximal to floor at lateral plantar foot 31.6 31.4 30.9  Circumference of ankle/heel       5 cm proximal to 1st MTP joint 23.4 23.3 24.4  Across MTP joint 22.7 23 23.5  Around proximal great toe 7.6 8.0 7.9  (Blank rows = not tested)  GAIT: Distance walked: walks with rollator walker, slow gait  Outcome measure:  Extreme difficulty/unable (0), Quite a bit of difficulty (1), Moderate difficulty (2), Little difficulty (3), No difficulty (4) Survey date:  10/14/24  Any of your usual work, housework or school activities 1  2. Usual hobbies, recreational or sporting activities 1  3. Getting into/out of the bath 0  4. Walking between rooms 1  5.  Putting on socks/shoes 1  6. Squatting  0  7. Lifting an object, like a bag of groceries from the floor 0  8. Performing light activities around your home 0  9. Performing heavy activities around your home 0  10. Getting into/out of a car 1  11. Walking 2 blocks 0  12. Walking 1 mile 0  13. Going up/down 10 stairs (1 flight) 0  14. Standing for 1 hour 0  15.  sitting for 1 hour 1  16. Running on even ground 0  17. Running on uneven ground 0  18. Making sharp turns while running fast 0  19. Hopping  0  20. Rolling over in bed 0  Score total:  6/80    ABC scale The Activities-Specific Balance Confidence (ABC) Scale 0% 10 20 30  40 50 60 70 80 90 100% No confidence<->completely confident  How confident are you that you will not lose your balance or become unsteady when you . . .   Date tested 10/14/24  Walk around the house 40%  2. Walk up or down stairs 0%  3. Bend over and pick up a slipper from in front of a closet floor 10%  4. Reach for a small can off a shelf at eye level 10%  5. Stand on tip toes and reach for something above your head 0%  6. Stand on a chair and reach for something 0%  7. Sweep the floor 0%  8. Walk outside the house to a car parked in the driveway 10%  9. Get into or out of a car 10%  10. Walk across a parking lot to the mall 0%  11. Walk up or down a ramp 0%  12. Walk in a crowded mall where people rapidly walk past you 10%  13. Are bumped into by people as you walk through the mall 10%  14. Step onto or off of an escalator while you are holding onto the railing 10%  15. Step onto or off an escalator while holding onto parcels such that you cannot hold onto the railing 0%  16. Walk outside on icy sidewalks 0%  Total: #/16 6.8% confidence in balance  TREATMENT DATE:  11/08/24: Manual Therapy, Application of Bandages   Removed bandages and washed legs assessing skin. Let legs breathe and move without bandaging. - pt walked to bathroom Large open red blister is now no longer red or open. The blister on the Rt shin is now a bit red.  Applied wound care coverings  - nonstick under both tg soft to anterior Rt leg and posterior Lt Compression Bandaging as follows with cocoa butter applied to both LE's:  Rt LE: Non adhesive large gauze over healing blister on shin; TG soft from foot to knee, Mollelast to toes 1-4 folding bandage in half for toes 3 and 4, and then Artiflex from foot to knee with extra padding on the shin.  Short stretch compression bandages: 6 cm Roman Sandal to foot into ASH 12 cm spiral from ankle to knee  Lt LE: Non adhesive bandage x 1 large size covering posterior thigh blister, with no ABD pad due to no drainage after last time, but extra padding to posterior blister.   TG large from foot to knee and Mollelast to toe 1-4 folding bandage in half for toes 3 and 4, and then Artiflex from foot to knee.  Short stretch compression bandages: Added 1/4 gray foam to dorsal foot and fixated with 6 cm 6 cm Roman sandal and and ASH pattern combined 12 cm heel lock and then spiral to knee Folded stockinette back over top of both bandages and put surgical booties on bil feet Pt reports bandages both comfortable at end of session.  11/06/24: Manual Therapy, Application of Bandages and Therapeutic Exercises Removed bandages and washed legs assessing skin. Let legs breathe and move without bandaging.  Had pt perform gentle ROM exs for muscle pump and strengthening: Standing with walker: Alt march, heel raises, and mini squat; then in sitting alt march, ankle pumps and LAQ's - handout issued Large open red blister is shrinking in size and much less painful, just ttp today. Applied wound care coverings  - nonstick under both tg soft to anterior Rt leg and posterior Lt Before reapplying bandages had husband come  in room briefly to show him how to apply velcro bandages as he will probably need to assist pt with this over expected long weekend due to inclement weather predicted.  Compression Bandaging as follows with cocoa butter applied to both LE's:  Rt LE: Non adhesive large gauze over healing blister on shin; TG soft from foot to knee, Mollelast to toes 1-4 folding bandage in half for toes 3 and 4, and then Artiflex from foot to knee.  Short stretch compression bandages: 6 cm Roman Sandal to foot into ASH 12 cm spiral from ankle to knee  Lt LE: Non adhesive bandage x 1 large size covering posterior thigh blister, with no ABD pad due to no drainage after last time, but extra padding to posterior blister.   TG large from foot to knee and Mollelast to toe 1-4 folding bandage in half for toes 3 and 4, and then Artiflex from foot to knee.  Short stretch compression bandages: Added 1/4 gray foam to dorsal foot and fixated with 6 cm 6 cm Roman sandal and and ASH pattern combined 12 cm heel lock and then spiral to knee Folded stockinette back over top of both bandages and put surgical booties on bil feet Pt reports bandages both comfortable at end of session.  11/04/24: Manual Therapy and Application of bandages  Removed bandages and washed legs assessing skin. Let legs  breathe and move without bandaging.  Stood up to assess back of left leg and took a photo to show pt and husband.  Large open red blister is shrinking in size but still painful.  Applied wound care coverings  - nonstick under both tg soft  Compression Bandaging as follows with cocoa butter applied to both LE's:  Rt LE: Non adhesive large gauze over open blister on shin, ABD pad over that and at dorsum of foot covering lateral malleolous where pt also reports tenderness and has increased hyperkeratosis; TG soft from foot to knee, Mollelast to toe 1-4 folding bandage in half for toes 3 and 4, and then Artiflex from foot to knee.  Short stretch  compression bandages: 6 cm Roman Sandal to foot into ASH 12 cm from ankle to knee Lt LE: Non adhesive bandage x 1 large size covering posterior thigh blister, with no ABD pad due to no drainage after last time.   TG large from foot to knee and Mollelast to toe 1-4 folding bandage in half for toes 3 and 4, and then Artiflex from foot to knee. - added another roll for more padding and omitted foam.  Short stretch compression bandages: Added 1/4 gray foam to dorsal foot and fixated with 6 cm 6 cm Roman sandal and and ASH pattern combined 12 cm heel lock and then spiral to knee Folded stockinette back over top of both bandages and put surgical booties on bil feet Pt reports bandages both comfortable at end of session.   PATIENT EDUCATION:  Education details: Bil LE exs in sitting and standing Person educated: Patient Education method: Explanation, demonstration, and handout issued Education comprehension: verbalized understanding, returned demo and will benefit from review  HOME EXERCISE PROGRAM: Use her velcro garment from wound care center, try to sleep out of the recliner , keep leg clean and dry Bil LE remedial exs in sitting and standing: Alt march, LAQ's, heel raises and ankle pumps, mini squats with walker  ASSESSMENT:  CLINICAL IMPRESSION: Continued with compression bandaging with good progress so far. Due to poor weather next week pt plans on removing bandages and applying velcro with the help of her husband.  She will wear tg soft and padding as well.   OBJECTIVE IMPAIRMENTS: decreased knowledge of condition, decreased knowledge of use of DME, decreased mobility, and increased edema.   ACTIVITY LIMITATIONS: locomotion level  PARTICIPATION LIMITATIONS: community activity  PERSONAL FACTORS: Time since onset of injury/illness/exacerbation and 1-2 comorbidities: CHF, CKD are also affecting patient's functional outcome.   REHAB POTENTIAL: Fair  CLINICAL DECISION MAKING: high  complexity   EVALUATION COMPLEXITY: High   GOALS: Goals reviewed with patient? Yes  SHORT TERM GOALS: Target date: 11/08/24  Pt will be educated on what lymphedema is and how it is best managed  Baseline: Goal status: INITIAL  2.  Pt will be educated on risk reduction and infection prevention including skin care Baseline:  Goal status: INITIAL   LONG TERM GOALS: Target date: 01/06/25  Pt will be ind with final compression for bil lower extremities  Baseline:  Goal status: INITIAL  2.  Pt will be educated on pump and pursue a trial if wanted Baseline:  Goal status: INITIAL  3.  Pt will be ind with remedial exercise  Baseline:  Goal status: INITIAL  4. Pt will return to no open wounds on the Lt lower leg  INITIAL   PLAN:  PT FREQUENCY: 3x/week  PT DURATION: 8 weeks   PLANNED INTERVENTIONS:  97110-Therapeutic exercises, 97530- Therapeutic activity, [id number]- Neuromuscular re-education, 97140- Manual therapy, Patient/Family education, Manual lymph drainage, Compression bandaging, DME instructions, and Self Care  PLAN FOR NEXT SESSION: Did she get mammogram scheduled? Cont with bandaging bil lower legs with need for wound care prn, and try application of velcro to Rt lower leg as her swelling here is less and will be manageable with velcro once ready; cont washing of the legs and feet.  [place]?  Larue Saddie SAUNDERS, PT 11/08/2024, 11:49 AM  "

## 2024-11-10 ENCOUNTER — Other Ambulatory Visit: Payer: Self-pay | Admitting: Family Medicine

## 2024-11-10 DIAGNOSIS — I10 Essential (primary) hypertension: Secondary | ICD-10-CM

## 2024-11-10 DIAGNOSIS — R600 Localized edema: Secondary | ICD-10-CM

## 2024-11-11 ENCOUNTER — Ambulatory Visit

## 2024-11-13 ENCOUNTER — Ambulatory Visit

## 2024-11-13 DIAGNOSIS — M6281 Muscle weakness (generalized): Secondary | ICD-10-CM

## 2024-11-13 DIAGNOSIS — I890 Lymphedema, not elsewhere classified: Secondary | ICD-10-CM | POA: Diagnosis not present

## 2024-11-13 DIAGNOSIS — R2681 Unsteadiness on feet: Secondary | ICD-10-CM
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
## 2024-11-15 ENCOUNTER — Ambulatory Visit

## 2024-11-15 DIAGNOSIS — I890 Lymphedema, not elsewhere classified: Secondary | ICD-10-CM | POA: Diagnosis not present

## 2024-11-15 DIAGNOSIS — M6281 Muscle weakness (generalized): Secondary | ICD-10-CM

## 2024-11-15 DIAGNOSIS — R2681 Unsteadiness on feet: Secondary | ICD-10-CM

## 2024-11-15 NOTE — Therapy (Signed)
 " OUTPATIENT PHYSICAL THERAPY  LOWER EXTREMITY ONCOLOGY TREATMENT  Patient Name: Tracy [name] MRN: [id number] DOB:1946-08-31, 79 y.o., female Today's Date: 11/15/2024  END OF SESSION:  PT End of Session - 11/15/24 1041     Visit Number 9    Number of Visits 25    Date for Recertification  01/06/25    PT Start Time 1043    PT Stop Time 1159    PT Time Calculation (min) 76 min    Activity Tolerance Patient tolerated treatment well    Behavior During Therapy [facility] for tasks assessed/performed            Past Medical History:  Diagnosis Date   Allergic rhinitis    Allergy    Anxiety    Arthritis    Ascending aorta dilation 09/13/2022   Echo 09/12/2022: EF 50-55, GR 1 DD, mildly reduced RVSF, normal PASP (RVSP 17.7), mild dilation of ascending aorta (38 mm), RAP 3   Breast cancer (HCC) 04/28/2020   rigtht breast   Bronchitis    CAD (coronary artery disease)    1/19 PCI/DES to pLCX for ISR, normal EF.    Cataract    Cervical dysplasia    unsure of procedure, possible burning in her late 81s   CHF (congestive heart failure) (HCC)    Echo 06/2019: EF 55-60, elevated LVEDP, normal RV SF, mild MAC, mild MR, trivial TR   Complication of anesthesia    COPD (chronic obstructive pulmonary disease) (HCC)    early    Depression    GERD (gastroesophageal reflux disease)    Gout    Heart murmur    Hyperlipidemia    Hypertension    Low back pain    Myocardial infarction (HCC)    Overactive bladder    PONV (postoperative nausea and vomiting)    Sleep apnea    Past Surgical History:  Procedure Laterality Date   ABDOMINAL HYSTERECTOMY     Partial   ANGIOPLASTY     stent 2007   BREAST EXCISIONAL BIOPSY Right 04/2020   ADH/LCIS   BREAST LUMPECTOMY WITH RADIOACTIVE SEED LOCALIZATION Right 04/28/2020   Procedure: RIGHT BREAST LUMPECTOMY X 2  WITH RADIOACTIVE SEED LOCALIZATION;  Surgeon: Belinda Cough, MD;  Location: [facility];  Service: General;   Laterality: Right;  LMA   BREAST SURGERY     CARDIAC CATHETERIZATION     COLONOSCOPY     CORONARY STENT INTERVENTION N/A 11/06/2017   Procedure: CORONARY STENT INTERVENTION;  Surgeon: Mady Bruckner, MD;  Location: [facility];  Service: Cardiovascular;  Laterality: N/A;   EYE SURGERY     LEFT HEART CATH AND CORONARY ANGIOGRAPHY N/A 11/06/2017   Procedure: LEFT HEART CATH AND CORONARY ANGIOGRAPHY;  Surgeon: Mady Bruckner, MD;  Location: [facility];  Service: Cardiovascular;  Laterality: N/A;   LEFT HEART CATH AND CORONARY ANGIOGRAPHY N/A 07/24/2019   Procedure: LEFT HEART CATH AND CORONARY ANGIOGRAPHY;  Surgeon: Verlin Bruckner BIRCH, MD;  Location: [facility];  Service: Cardiovascular;  Laterality: N/A;   RIGHT/LEFT HEART CATH AND CORONARY ANGIOGRAPHY N/A 09/29/2020   Procedure: RIGHT/LEFT HEART CATH AND CORONARY ANGIOGRAPHY;  Surgeon: Claudene Victory ORN, MD;  Location: [facility];  Service: Cardiovascular;  Laterality: N/A;   ROBOTIC ASSISTED BILATERAL SALPINGO OOPHERECTOMY Bilateral 11/10/2020   Procedure: XI ROBOTIC ASSISTED BILATERAL SALPINGO OOPHORECTOMY WITH MINI LAPAROTOMY FOR DRAINAGE;  Surgeon: Viktoria Comer SAUNDERS, MD;  Location: [facility];  Service: Gynecology;  Laterality:  Bilateral;  MINI LAP FIRST   TONSILLECTOMY     VULVECTOMY N/A 11/10/2020   Procedure: WIDE EXCISION VULVECTOMY;  Surgeon: Viktoria Comer SAUNDERS, MD;  Location: [facility];  Service: Gynecology;  Laterality: N/A;   Patient Active Problem List   Diagnosis Date Noted   PAD (peripheral artery disease) 04/29/2024   Weakness 10/05/2023   Bronchopneumonia 10/05/2023   Multifocal pneumonia 09/29/2023   Hypokalemia 08/07/2023   Intertrigo 04/18/2023   Venous stasis dermatitis of both lower extremities 11/28/2022   Ascending aorta dilation 09/13/2022   Dizziness 01/19/2022   Frequent falls 01/19/2022   Tremor of both hands 08/16/2021   Aortic atherosclerosis 06/15/2021   Generalized  osteoarthritis of multiple sites 12/25/2020   Chronic pain disorder 12/25/2020   Pelvic mass in female    Bilateral lower extremity edema 04/13/2020   Shortness of breath    Anxiety disorder 10/29/2018   CKD (chronic kidney disease), stage III (HCC) 06/27/2018   COPD (chronic obstructive pulmonary disease) (HCC) 05/25/2018   Obesity 03/20/2018   Dyspnea on exertion 11/06/2017   Abnormal stress test 11/06/2017   Chronic heart failure with preserved ejection fraction (HCC) 09/04/2017   Left ventricular dysfunction 07/31/2017   Cataract, nuclear sclerotic senile, bilateral 02/01/2017   Gouty arthritis of toe of left foot 08/09/2016   Hyperuricemia 08/09/2016   Osteoarthritis (arthritis due to wear and tear of joints) 01/14/2014   Atherosclerosis of native coronary artery of native heart with angina pectoris 08/02/2011   Mitral regurgitation 08/02/2011   OVERACTIVE BLADDER 07/02/2010   ACUTE CYSTITIS 05/25/2010   Mixed hyperlipidemia 08/11/2009   Leg edema, left 07/22/2009   GERD 04/11/2008   Constipation 04/11/2008   Essential hypertension 05/30/2007   MYOCARDIAL INFARCTION, HX OF 05/30/2007   Allergic rhinitis 05/30/2007   LOW BACK PAIN 05/30/2007     REFERRING PROVIDER: Betty Jordan, MD  REFERRING DIAG: I89.0  THERAPY DIAG:  Lymphedema, not elsewhere classified  Unsteadiness on feet  Muscle weakness (generalized)  ONSET DATE: unknown  Rationale for Evaluation and Treatment: Rehabilitation  SUBJECTIVE:                                                                                                                                                                                           SUBJECTIVE STATEMENT:   The left leg is sore in the front at the top and the top of the foot. The foam helped a little. The right leg feels OK. My stomach is upset today.   PERTINENT HISTORY: HTN, CAD, COPD, OA, CKD stage 3, Controlled CHF but does have some soft pitting edema  bilaterally. 11/10/20: Robotic BSO and  removal of left labial mass and fallopian tube 18cm cystic mass, pathology showed serous cystadenoma and benign hidradenoma papilliferum. Size difference noted even back in 2020 with negative doppler for DVT. Recurrent cellultis in the leg.   PAIN:  Are you having pain? Left anterior leg 7/10, worse with the cold, PRECAUTIONS: Recurrent infections on the Lt leg, not cured by antibiotics multiple times.   RED FLAGS: None   WEIGHT BEARING RESTRICTIONS: No  FALLS:  Has patient fallen in last 6 months? Yes. Number of falls 4-5 I was walking around the house I can't go upstairs anymore.   I was walking and tripped   LIVING ENVIRONMENT: Lives with: lives with their family and lives with their spouse Lives in: House/apartment Has following equipment at home: [employer] - 4 wheeled and SPC , bedside commode, Using baby wipes for showering Can get dressed ind but difficult Sleeping in a chair  OCCUPATION: retired   LEISURE: not addressed   PRIOR LEVEL OF FUNCTION: Independent with basic ADLs - see living environment   PATIENT GOALS: improve legs and mobility    OBJECTIVE: Note: Objective measures were completed at Evaluation unless otherwise noted.  COGNITION: Overall cognitive status: Within functional limits for tasks assessed   PALPATION: Deferred due to leg status  OBSERVATIONS / OTHER ASSESSMENTS: Lt LE is larger than Rt up to groin.  Rt leg has edema evident mostly around the ankle with indentions from her shoe and mild redness. She has an ace wrap around the ankle and lower leg, The Lt leg Lt is a brighter red color.  Various very wet bandages on the lateral and posterior leg that are covering up blisters and that are essentially stuck on to the skin due to time on the leg.  White covering over the legs that pt reports is neosporin and desitin.  Ace wrap over the bandages.  Wearing wet sock and shoe with a harsh odor of exudate.  Also some  thick scaly hyperkeratosis on the front of the left leg and some lymphorrhea bumps and skin thickening on the back of the leg. No open places noted, but scabbing over the original wound site and various red blisters that have popped.                Prior Photo from June  Photo from 10/14/24   Photos from 10/30/24     11/08/24    LYMPHEDEMA ASSESSMENTS:  LOWER EXTREMITY LANDMARK RIGHT 04/15/24 10/14/24  At groin     30 cm proximal to suprapatella     20 cm proximal to suprapatella     10 cm proximal to suprapatella     At midpatella / popliteal crease     30 cm proximal to floor at lateral plantar foot 39   20 cm proximal to floor at lateral plantar foot 29.5   10 cm proximal to floor at lateral plantar foot 28.3   Circumference of ankle/heel     5 cm proximal to 1st MTP joint 23.3   Across MTP joint 23.2   Around proximal great toe 8.0   (Blank rows = not tested)   LOWER EXTREMITY LANDMARK LEFT 04/12/24 In supine today 04/15/24 10/30/2024  At groin       30 cm proximal to suprapatella       20 cm proximal to suprapatella       10 cm proximal to suprapatella 52.2     At midpatella / popliteal crease 43.2  30 cm proximal to floor at lateral plantar foot 44.6 43.5 40.6  20 cm proximal to floor at lateral plantar foot 40.5 40.2 36.7  10 cm proximal to floor at lateral plantar foot 31.6 31.4 30.9  Circumference of ankle/heel       5 cm proximal to 1st MTP joint 23.4 23.3 24.4  Across MTP joint 22.7 23 23.5  Around proximal great toe 7.6 8.0 7.9  (Blank rows = not tested)  GAIT: Distance walked: walks with rollator walker, slow gait  Outcome measure:  Extreme difficulty/unable (0), Quite a bit of difficulty (1), Moderate difficulty (2), Little difficulty (3), No difficulty (4) Survey date:  10/14/24  Any of your usual work, housework or school activities 1  2. Usual hobbies, recreational or sporting activities 1  3. Getting into/out of the bath 0  4. Walking between  rooms 1  5. Putting on socks/shoes 1  6. Squatting  0  7. Lifting an object, like a bag of groceries from the floor 0  8. Performing light activities around your home 0  9. Performing heavy activities around your home 0  10. Getting into/out of a car 1  11. Walking 2 blocks 0  12. Walking 1 mile 0  13. Going up/down 10 stairs (1 flight) 0  14. Standing for 1 hour 0  15.  sitting for 1 hour 1  16. Running on even ground 0  17. Running on uneven ground 0  18. Making sharp turns while running fast 0  19. Hopping  0  20. Rolling over in bed 0  Score total:  6/80    ABC scale The Activities-Specific Balance Confidence (ABC) Scale 0% 10 20 30  40 50 60 70 80 90 100% No confidence<->completely confident  How confident are you that you will not lose your balance or become unsteady when you . . .   Date tested 10/14/24  Walk around the house 40%  2. Walk up or down stairs 0%  3. Bend over and pick up a slipper from in front of a closet floor 10%  4. Reach for a small can off a shelf at eye level 10%  5. Stand on tip toes and reach for something above your head 0%  6. Stand on a chair and reach for something 0%  7. Sweep the floor 0%  8. Walk outside the house to a car parked in the driveway 10%  9. Get into or out of a car 10%  10. Walk across a parking lot to the mall 0%  11. Walk up or down a ramp 0%  12. Walk in a crowded mall where people rapidly walk past you 10%  13. Are bumped into by people as you walk through the mall 10%  14. Step onto or off of an escalator while you are holding onto the railing 10%  15. Step onto or off an escalator while holding onto parcels such that you cannot hold onto the railing 0%  16. Walk outside on icy sidewalks 0%  Total: #/16 6.8% confidence in balance  TREATMENT DATE:  11/15/2024 Toes and distal area of bilateral  feet extremely red. Asked  therapist Saddie Raw, PT to check and she did think they were slightly more red than usual,d but felt she was OK to wrap, Manual Therapy, Application of Bandages  Removed bandages and washed legs assessing skin.  Large blister is now no longer red or open at Lt posterior calf. There continues to be scabbing and dry skin. The blister on the Rt shin is healing well.  Applied wound care coverings  - nonstick under both tg soft to anterior Rt leg and posterior Lt to include thickening of skin that had a small amount of drainage on last non adhesive dressing.  Compression Bandaging as follows with cocoa butter applied to both LE's:  Rt LE: Non adhesive large gauze over healing blister on shin; med TG soft from foot to knee, Mollelast to toes 1-4 folding bandage in half for toes 3 and 4, and then Artiflex from foot to knee with extra padding on the shin.  Short stretch compression bandages: 6 cm Roman Sandal to foot into ASH 12 cm spiral from ankle to knee with heel lock Lt LE: Non adhesive bandage x 1 large size covering posterior calf blister to superior to achilles where new thick and mildly draining skin noted, with no ABD pad due to very mild drainage after last time, but extra padding with artiflex to posterior blister.   TG large from foot to knee and Mollelast to toe 1-4 folding bandage in half for toes 3 and 4, and then Artiflex from foot to knee.  Short stretch compression bandages: Added 1/4 gray foam to dorsal foot/ankle and fixated with 6 cm 6 cm Roman sandal and and ASH pattern combined 12 cm heel lock and then spiral to knee Folded stockinette back over top of both bandages and put pts grippy hospital socks she brought and then surgical booties on bil feet Pt reports bandages both comfortable at end of session.    11/13/24: Manual Therapy, Application of Bandages  Removed bandages and washed legs assessing skin.  Large blister is now no longer red or open at  Lt posterior calf. There is a lot of scabbing and dry skin that sloughed off during washing. The blister on the Rt shin is healing well.  Applied wound care coverings  - nonstick under both tg soft to anterior Rt leg and posterior Lt to include thickening of skin that had a small amount of drainage on last non adhesive dressing.  Compression Bandaging as follows with cocoa butter applied to both LE's:  Rt LE: Non adhesive large gauze over healing blister on shin; med TG soft from foot to knee, Mollelast to toes 1-4 folding bandage in half for toes 3 and 4, and then Artiflex from foot to knee with extra padding on the shin.  Short stretch compression bandages: 6 cm Roman Sandal to foot into ASH 12 cm spiral from ankle to knee with heel lock Lt LE: Non adhesive bandage x 1 large size covering posterior calf blister to superior to achilles where new thick and mildly draining skin noted, with no ABD pad due to very mild drainage after last time, but extra padding with artiflex to posterior blister.   TG large from foot to knee and Mollelast to toe 1-4 folding bandage in half for toes 3 and 4, and then Artiflex from foot to knee.  Short stretch compression bandages: Added 1/4 gray foam to dorsal foot/ankle and fixated  with 6 cm 6 cm Roman sandal and and ASH pattern combined 12 cm heel lock and then spiral to knee Folded stockinette back over top of both bandages and put pts grippy hospital socks she brought and then surgical booties on bil feet Pt reports bandages both comfortable at end of session.   11/08/24: Manual Therapy, Application of Bandages  Removed bandages and washed legs assessing skin. Let legs breathe and move without bandaging. - pt walked to bathroom Large open red blister is now no longer red or open. The blister on the Rt shin is now a bit red.  Applied wound care coverings  - nonstick under both tg soft to anterior Rt leg and posterior Lt Compression Bandaging as follows with cocoa  butter applied to both LE's:  Rt LE: Non adhesive large gauze over healing blister on shin; TG soft from foot to knee, Mollelast to toes 1-4 folding bandage in half for toes 3 and 4, and then Artiflex from foot to knee with extra padding on the shin.  Short stretch compression bandages: 6 cm Roman Sandal to foot into ASH 12 cm spiral from ankle to knee  Lt LE: Non adhesive bandage x 1 large size covering posterior thigh blister, with no ABD pad due to no drainage after last time, but extra padding to posterior blister.   TG large from foot to knee and Mollelast to toe 1-4 folding bandage in half for toes 3 and 4, and then Artiflex from foot to knee.  Short stretch compression bandages: Added 1/4 gray foam to dorsal foot and fixated with 6 cm 6 cm Roman sandal and and ASH pattern combined 12 cm heel lock and then spiral to knee Folded stockinette back over top of both bandages and put surgical booties on bil feet Pt reports bandages both comfortable at end of session.  11/06/24: Manual Therapy, Application of Bandages and Therapeutic Exercises Removed bandages and washed legs assessing skin. Let legs breathe and move without bandaging.  Had pt perform gentle ROM exs for muscle pump and strengthening: Standing with walker: Alt march, heel raises, and mini squat; then in sitting alt march, ankle pumps and LAQ's - handout issued Large open red blister is shrinking in size and much less painful, just ttp today. Applied wound care coverings  - nonstick under both tg soft to anterior Rt leg and posterior Lt Before reapplying bandages had husband come in room briefly to show him how to apply velcro bandages as he will probably need to assist pt with this over expected long weekend due to inclement weather predicted.  Compression Bandaging as follows with cocoa butter applied to both LE's:  Rt LE: Non adhesive large gauze over healing blister on shin; TG soft from foot to knee, Mollelast to toes 1-4  folding bandage in half for toes 3 and 4, and then Artiflex from foot to knee.  Short stretch compression bandages: 6 cm Roman Sandal to foot into ASH 12 cm spiral from ankle to knee  Lt LE: Non adhesive bandage x 1 large size covering posterior thigh blister, with no ABD pad due to no drainage after last time, but extra padding to posterior blister.   TG large from foot to knee and Mollelast to toe 1-4 folding bandage in half for toes 3 and 4, and then Artiflex from foot to knee.  Short stretch compression bandages: Added 1/4 gray foam to dorsal foot and fixated with 6 cm 6 cm Roman sandal and and ASH pattern  combined 12 cm heel lock and then spiral to knee Folded stockinette back over top of both bandages and put surgical booties on bil feet Pt reports bandages both comfortable at end of session.     PATIENT EDUCATION:  Education details: Bil LE exs in sitting and standing Person educated: Patient Education method: Explanation, demonstration, and handout issued Education comprehension: verbalized understanding, returned demo and will benefit from review  HOME EXERCISE PROGRAM: Use her velcro garment from wound care center, try to sleep out of the recliner , keep leg clean and dry Bil LE remedial exs in sitting and standing: Alt march, LAQ's, heel raises and ankle pumps, mini squats with walker  ASSESSMENT:  CLINICAL IMPRESSION: Continued with compression bandaging with good progress so far. Pt continues with scabbing and dry skin throughout but wound areas healing well and non stick telfa has been beneficial. Pt had some complaints of left leg pain today, but felt it might have been the weather. Pt reported wraps comfortable at end of session. Reminded she may have discomfort, but should not experience pain with the wraps. She will try to leave on until Sunday, but if she experiences pain she should remove them and apply velcro. OBJECTIVE IMPAIRMENTS: decreased knowledge of  condition, decreased knowledge of use of DME, decreased mobility, and increased edema.   ACTIVITY LIMITATIONS: locomotion level  PARTICIPATION LIMITATIONS: community activity  PERSONAL FACTORS: Time since onset of injury/illness/exacerbation and 1-2 comorbidities: CHF, CKD are also affecting patient's functional outcome.   REHAB POTENTIAL: Fair  CLINICAL DECISION MAKING: high complexity   EVALUATION COMPLEXITY: High   GOALS: Goals reviewed with patient? Yes  SHORT TERM GOALS: Target date: 11/08/24  Pt will be educated on what lymphedema is and how it is best managed  Baseline: Goal status: INITIAL  2.  Pt will be educated on risk reduction and infection prevention including skin care Baseline:  Goal status: INITIAL   LONG TERM GOALS: Target date: 01/06/25  Pt will be ind with final compression for bil lower extremities  Baseline:  Goal status: INITIAL  2.  Pt will be educated on pump and pursue a trial if wanted Baseline:  Goal status: INITIAL  3.  Pt will be ind with remedial exercise  Baseline:  Goal status: INITIAL  4. Pt will return to no open wounds on the Lt lower leg  INITIAL   PLAN:  PT FREQUENCY: 3x/week  PT DURATION: 8 weeks   PLANNED INTERVENTIONS: 97110-Therapeutic exercises, 97530- Therapeutic activity, 97112- Neuromuscular re-education, 97140- Manual therapy, Patient/Family education, Manual lymph drainage, Compression bandaging, DME instructions, and Self Care  PLAN FOR NEXT SESSION: Cont with bandaging bil lower legs with need for wound care prn, and try application of velcro to Rt lower leg as her swelling here is less and will be manageable with velcro once ready; cont washing of the legs and feet.  [place]?  Grayce JINNY Sheldon, PT 11/15/2024, 12:34 PM  "

## 2024-11-18 ENCOUNTER — Ambulatory Visit

## 2024-11-20 ENCOUNTER — Ambulatory Visit

## 2024-11-20 DIAGNOSIS — M6281 Muscle weakness (generalized): Secondary | ICD-10-CM

## 2024-11-20 DIAGNOSIS — R2681 Unsteadiness on feet: Secondary | ICD-10-CM

## 2024-11-20 DIAGNOSIS — I890 Lymphedema, not elsewhere classified: Secondary | ICD-10-CM

## 2024-11-20 NOTE — Therapy (Signed)
 " OUTPATIENT PHYSICAL THERAPY  LOWER EXTREMITY ONCOLOGY TREATMENT  Progress Note Reporting Period 10/14/2025 to 11/20/2024  See note below for Objective Data and Assessment of Progress/Goals.      Patient Name: Tracy [name] MRN: [id number] DOB:1946-08-09, 79 y.o., female Today's Date: 11/20/2024  END OF SESSION:  PT End of Session - 11/20/24 1412     Visit Number 10    Number of Visits 25    Date for Recertification  01/06/25    Authorization Type none    Progress Note Due on Visit 20    PT Start Time 1403    PT Stop Time 1503    PT Time Calculation (min) 60 min    Activity Tolerance Patient tolerated treatment well    Behavior During Therapy [facility] for tasks assessed/performed            Past Medical History:  Diagnosis Date   Allergic rhinitis    Allergy    Anxiety    Arthritis    Ascending aorta dilation 09/13/2022   Echo 09/12/2022: EF 50-55, GR 1 DD, mildly reduced RVSF, normal PASP (RVSP 17.7), mild dilation of ascending aorta (38 mm), RAP 3   Breast cancer (HCC) 04/28/2020   rigtht breast   Bronchitis    CAD (coronary artery disease)    1/19 PCI/DES to pLCX for ISR, normal EF.    Cataract    Cervical dysplasia    unsure of procedure, possible burning in her late 63s   CHF (congestive heart failure) (HCC)    Echo 06/2019: EF 55-60, elevated LVEDP, normal RV SF, mild MAC, mild MR, trivial TR   Complication of anesthesia    COPD (chronic obstructive pulmonary disease) (HCC)    early    Depression    GERD (gastroesophageal reflux disease)    Gout    Heart murmur    Hyperlipidemia    Hypertension    Low back pain    Myocardial infarction (HCC)    Overactive bladder    PONV (postoperative nausea and vomiting)    Sleep apnea    Past Surgical History:  Procedure Laterality Date   ABDOMINAL HYSTERECTOMY     Partial   ANGIOPLASTY     stent 2007   BREAST EXCISIONAL BIOPSY Right 04/2020   ADH/LCIS   BREAST LUMPECTOMY WITH RADIOACTIVE SEED  LOCALIZATION Right 04/28/2020   Procedure: RIGHT BREAST LUMPECTOMY X 2  WITH RADIOACTIVE SEED LOCALIZATION;  Surgeon: Belinda Cough, MD;  Location: [facility];  Service: General;  Laterality: Right;  LMA   BREAST SURGERY     CARDIAC CATHETERIZATION     COLONOSCOPY     CORONARY STENT INTERVENTION N/A 11/06/2017   Procedure: CORONARY STENT INTERVENTION;  Surgeon: Mady Bruckner, MD;  Location: [facility];  Service: Cardiovascular;  Laterality: N/A;   EYE SURGERY     LEFT HEART CATH AND CORONARY ANGIOGRAPHY N/A 11/06/2017   Procedure: LEFT HEART CATH AND CORONARY ANGIOGRAPHY;  Surgeon: Mady Bruckner, MD;  Location: [facility];  Service: Cardiovascular;  Laterality: N/A;   LEFT HEART CATH AND CORONARY ANGIOGRAPHY N/A 07/24/2019   Procedure: LEFT HEART CATH AND CORONARY ANGIOGRAPHY;  Surgeon: Verlin Bruckner BIRCH, MD;  Location: [facility];  Service: Cardiovascular;  Laterality: N/A;   RIGHT/LEFT HEART CATH AND CORONARY ANGIOGRAPHY N/A 09/29/2020   Procedure: RIGHT/LEFT HEART CATH AND CORONARY ANGIOGRAPHY;  Surgeon: Claudene Victory ORN, MD;  Location: [facility];  Service: Cardiovascular;  Laterality: N/A;  ROBOTIC ASSISTED BILATERAL SALPINGO OOPHERECTOMY Bilateral 11/10/2020   Procedure: XI ROBOTIC ASSISTED BILATERAL SALPINGO OOPHORECTOMY WITH MINI LAPAROTOMY FOR DRAINAGE;  Surgeon: Viktoria Comer SAUNDERS, MD;  Location: [facility];  Service: Gynecology;  Laterality: Bilateral;  MINI LAP FIRST   TONSILLECTOMY     VULVECTOMY N/A 11/10/2020   Procedure: WIDE EXCISION VULVECTOMY;  Surgeon: Viktoria Comer SAUNDERS, MD;  Location: [facility];  Service: Gynecology;  Laterality: N/A;   Patient Active Problem List   Diagnosis Date Noted   PAD (peripheral artery disease) 04/29/2024   Weakness 10/05/2023   Bronchopneumonia 10/05/2023   Multifocal pneumonia 09/29/2023   Hypokalemia 08/07/2023   Intertrigo 04/18/2023   Venous stasis dermatitis of both lower  extremities 11/28/2022   Ascending aorta dilation 09/13/2022   Dizziness 01/19/2022   Frequent falls 01/19/2022   Tremor of both hands 08/16/2021   Aortic atherosclerosis 06/15/2021   Generalized osteoarthritis of multiple sites 12/25/2020   Chronic pain disorder 12/25/2020   Pelvic mass in female    Bilateral lower extremity edema 04/13/2020   Shortness of breath    Anxiety disorder 10/29/2018   CKD (chronic kidney disease), stage III (HCC) 06/27/2018   COPD (chronic obstructive pulmonary disease) (HCC) 05/25/2018   Obesity 03/20/2018   Dyspnea on exertion 11/06/2017   Abnormal stress test 11/06/2017   Chronic heart failure with preserved ejection fraction (HCC) 09/04/2017   Left ventricular dysfunction 07/31/2017   Cataract, nuclear sclerotic senile, bilateral 02/01/2017   Gouty arthritis of toe of left foot 08/09/2016   Hyperuricemia 08/09/2016   Osteoarthritis (arthritis due to wear and tear of joints) 01/14/2014   Atherosclerosis of native coronary artery of native heart with angina pectoris 08/02/2011   Mitral regurgitation 08/02/2011   OVERACTIVE BLADDER 07/02/2010   ACUTE CYSTITIS 05/25/2010   Mixed hyperlipidemia 08/11/2009   Leg edema, left 07/22/2009   GERD 04/11/2008   Constipation 04/11/2008   Essential hypertension 05/30/2007   MYOCARDIAL INFARCTION, HX OF 05/30/2007   Allergic rhinitis 05/30/2007   LOW BACK PAIN 05/30/2007     REFERRING PROVIDER: Betty Jordan, MD  REFERRING DIAG: I89.0  THERAPY DIAG:  Lymphedema, not elsewhere classified  Unsteadiness on feet  Muscle weakness (generalized)  ONSET DATE: unknown  Rationale for Evaluation and Treatment: Rehabilitation  SUBJECTIVE:                                                                                                                                                                                           SUBJECTIVE STATEMENT:   I kept the bandages on until Monday. I took them off and  washed my legs then and put the TG soft  back on. My legs have been hurting more since then, especially the Lt one. It's a lot more swollen too. I didn't ask my husband to put my velcro garments on because I have been dealing with diarrhea the past few days.    PERTINENT HISTORY: HTN, CAD, COPD, OA, CKD stage 3, Controlled CHF but does have some soft pitting edema bilaterally. 11/10/20: Robotic BSO and removal of left labial mass and fallopian tube 18cm cystic mass, pathology showed serous cystadenoma and benign hidradenoma papilliferum. Size difference noted even back in 2020 with negative doppler for DVT. Recurrent cellultis in the leg.   PAIN:  Are you having pain? Left anterior leg 7-8/10, worse with the cold and with recent increased swelling  PRECAUTIONS: Recurrent infections on the Lt leg, not cured by antibiotics multiple times.   RED FLAGS: None   WEIGHT BEARING RESTRICTIONS: No  FALLS:  Has patient fallen in last 6 months? Yes. Number of falls 4-5 I was walking around the house I can't go upstairs anymore.   I was walking and tripped   LIVING ENVIRONMENT: Lives with: lives with their family and lives with their spouse Lives in: House/apartment Has following equipment at home: [employer] - 4 wheeled and SPC , bedside commode, Using baby wipes for showering Can get dressed ind but difficult Sleeping in a chair  OCCUPATION: retired   LEISURE: not addressed   PRIOR LEVEL OF FUNCTION: Independent with basic ADLs - see living environment   PATIENT GOALS: improve legs and mobility    OBJECTIVE: Note: Objective measures were completed at Evaluation unless otherwise noted.  COGNITION: Overall cognitive status: Within functional limits for tasks assessed   PALPATION: Deferred due to leg status  OBSERVATIONS / OTHER ASSESSMENTS: Lt LE is larger than Rt up to groin.  Rt leg has edema evident mostly around the ankle with indentions from her shoe and mild redness. She has an ace  wrap around the ankle and lower leg, The Lt leg Lt is a brighter red color.  Various very wet bandages on the lateral and posterior leg that are covering up blisters and that are essentially stuck on to the skin due to time on the leg.  White covering over the legs that pt reports is neosporin and desitin.  Ace wrap over the bandages.  Wearing wet sock and shoe with a harsh odor of exudate.  Also some thick scaly hyperkeratosis on the front of the left leg and some lymphorrhea bumps and skin thickening on the back of the leg. No open places noted, but scabbing over the original wound site and various red blisters that have popped.                Prior Photo from June  Photo from 10/14/24   Photos from 10/30/24     11/08/24    LYMPHEDEMA ASSESSMENTS:  LOWER EXTREMITY LANDMARK RIGHT 04/15/24 11/20/2024  At groin     30 cm proximal to suprapatella     20 cm proximal to suprapatella     10 cm proximal to suprapatella     At midpatella / popliteal crease     30 cm proximal to floor at lateral plantar foot 39 40.5  20 cm proximal to floor at lateral plantar foot 29.5 25.2  10 cm proximal to floor at lateral plantar foot 28.3 27.9  Circumference of ankle/heel     5 cm proximal to 1st MTP joint 23.3 25.7  Across MTP joint 23.2 24.1  Around  proximal great toe 8.0 7.6  (Blank rows = not tested)   LOWER EXTREMITY LANDMARK LEFT 04/12/24 In supine today 04/15/24 10/30/2024 11/20/2024  At groin        30 cm proximal to suprapatella        20 cm proximal to suprapatella        10 cm proximal to suprapatella 52.2      At midpatella / popliteal crease 43.2      30 cm proximal to floor at lateral plantar foot 44.6 43.5 40.6 48.5  20 cm proximal to floor at lateral plantar foot 40.5 40.2 36.7 41.4  10 cm proximal to floor at lateral plantar foot 31.6 31.4 30.9 31.2  Circumference of ankle/heel        5 cm proximal to 1st MTP joint 23.4 23.3 24.4 26.7  Across MTP joint 22.7 23 23.5 24.4  Around proximal  great toe 7.6 8.0 7.9 7.7  (Blank rows = not tested)  GAIT: Distance walked: walks with rollator walker, slow gait  Outcome measure:  Extreme difficulty/unable (0), Quite a bit of difficulty (1), Moderate difficulty (2), Little difficulty (3), No difficulty (4) Survey date:  10/14/24  Any of your usual work, housework or school activities 1  2. Usual hobbies, recreational or sporting activities 1  3. Getting into/out of the bath 0  4. Walking between rooms 1  5. Putting on socks/shoes 1  6. Squatting  0  7. Lifting an object, like a bag of groceries from the floor 0  8. Performing light activities around your home 0  9. Performing heavy activities around your home 0  10. Getting into/out of a car 1  11. Walking 2 blocks 0  12. Walking 1 mile 0  13. Going up/down 10 stairs (1 flight) 0  14. Standing for 1 hour 0  15.  sitting for 1 hour 1  16. Running on even ground 0  17. Running on uneven ground 0  18. Making sharp turns while running fast 0  19. Hopping  0  20. Rolling over in bed 0  Score total:  6/80    ABC scale The Activities-Specific Balance Confidence (ABC) Scale 0% 10 20 30  40 50 60 70 80 90 100% No confidence<->completely confident  How confident are you that you will not lose your balance or become unsteady when you . . .   Date tested 10/14/24  Walk around the house 40%  2. Walk up or down stairs 0%  3. Bend over and pick up a slipper from in front of a closet floor 10%  4. Reach for a small can off a shelf at eye level 10%  5. Stand on tip toes and reach for something above your head 0%  6. Stand on a chair and reach for something 0%  7. Sweep the floor 0%  8. Walk outside the house to a car parked in the driveway 10%  9. Get into or out of a car 10%  10. Walk across a parking lot to the mall 0%  11. Walk up or down a ramp 0%  12. Walk in a crowded mall where people rapidly walk past you 10%  13. Are bumped into by people as you walk through the mall  10%  14. Step onto or off of an escalator while you are holding onto the railing 10%  15. Step onto or off an escalator while holding onto parcels such that you cannot hold onto the railing  0%  16. Walk outside on icy sidewalks 0%  Total: #/16 6.8% confidence in balance                                                                                                                               TREATMENT DATE:  11/20/24: Manual Therapy, Application of Bandages  Removed bandages and washed legs assessing skin. TG soft that pt had applied had rolled down at top of lower Lt leg which caused much increased swelling, see above circumference measurements. Her Rt LE though is improved since beginning of this episode. Explained to pt importance of folding Tg soft over to create wide band at top of leg instead of the roll that created a tourniquet that she had unintentionally caused. She verbalized good understanding and will be moindufl of this in future. Also advised pt that she has to have compression on her legs when she removes bandages and needs to wear her velcro garments when not in bandages to keep circumference reduced.  Large blister is now no longer red or open at Lt posterior calf but there was a small amount if clear drainage where skin had been stretched with recent increased circumference since last session. There continues to be scabbing and dry skin. The blister on the Rt shin conts to heal well.  Applied wound care coverings  - nonstick under both tg soft to anterior Rt leg and posterior Lt.  Compression Bandaging as follows with cocoa butter applied to both LE's:  Rt LE: Non adhesive 2 small gauze over healing blister on shin; med TG soft from foot to knee, Mollelast to toes 1-4 folding bandage in half for toes 3 and 4, and then Artiflex from foot to knee  Short stretch compression bandages: 6 cm Roman Sandal to foot into ASH 12 cm spiral from ankle to knee with heel lock Lt LE: Non  adhesive bandage 2 small size overlapping to cover posterior calf blister TG large from foot to knee and Mollelast to toe 1-4, and then Artiflex from foot to knee.  Short stretch compression bandages: Added 1/4 gray foam to dorsal foot near toes to ankle and fixated with 6 cm 6 cm Roman sandal and and ASH pattern combined 12 cm heel lock and then spiral to knee Folded stockinette back over top of both bandages, surgical booties on bil feet and then fixated grocery bags over this to keep pts feet dry.  Pt reports bandages both comfortable at end of session.  11/15/2024 Toes and distal area of bilateral feet extremely red. Asked  therapist Saddie Raw, PT to check and she did think they were slightly more red than usual,d but felt she was OK to wrap, Manual Therapy, Application of Bandages  Removed bandages and washed legs assessing skin.  Large blister is now no longer red or open at Lt posterior calf. There continues to be scabbing and dry skin. The blister on the Rt shin is  healing well.  Applied wound care coverings  - nonstick under both tg soft to anterior Rt leg and posterior Lt to include thickening of skin that had a small amount of drainage on last non adhesive dressing.  Compression Bandaging as follows with cocoa butter applied to both LE's:  Rt LE: Non adhesive large gauze over healing blister on shin; med TG soft from foot to knee, Mollelast to toes 1-4 folding bandage in half for toes 3 and 4, and then Artiflex from foot to knee with extra padding on the shin.  Short stretch compression bandages: 6 cm Roman Sandal to foot into ASH 12 cm spiral from ankle to knee with heel lock Lt LE: Non adhesive bandage x 1 large size covering posterior calf blister to superior to achilles where new thick and mildly draining skin noted, with no ABD pad due to very mild drainage after last time, but extra padding with artiflex to posterior blister.   TG large from foot to knee and Mollelast to toe 1-4  folding bandage in half for toes 3 and 4, and then Artiflex from foot to knee.  Short stretch compression bandages: Added 1/4 gray foam to dorsal foot/ankle and fixated with 6 cm 6 cm Roman sandal and and ASH pattern combined 12 cm heel lock and then spiral to knee Folded stockinette back over top of both bandages and put pts grippy hospital socks she brought and then surgical booties on bil feet Pt reports bandages both comfortable at end of session.    11/13/24: Manual Therapy, Application of Bandages  Removed bandages and washed legs assessing skin.  Large blister is now no longer red or open at Lt posterior calf. There is a lot of scabbing and dry skin that sloughed off during washing. The blister on the Rt shin is healing well.  Applied wound care coverings  - nonstick under both tg soft to anterior Rt leg and posterior Lt to include thickening of skin that had a small amount of drainage on last non adhesive dressing.  Compression Bandaging as follows with cocoa butter applied to both LE's:  Rt LE: Non adhesive large gauze over healing blister on shin; med TG soft from foot to knee, Mollelast to toes 1-4 folding bandage in half for toes 3 and 4, and then Artiflex from foot to knee with extra padding on the shin.  Short stretch compression bandages: 6 cm Roman Sandal to foot into ASH 12 cm spiral from ankle to knee with heel lock Lt LE: Non adhesive bandage x 1 large size covering posterior calf blister to superior to achilles where new thick and mildly draining skin noted, with no ABD pad due to very mild drainage after last time, but extra padding with artiflex to posterior blister.   TG large from foot to knee and Mollelast to toe 1-4 folding bandage in half for toes 3 and 4, and then Artiflex from foot to knee.  Short stretch compression bandages: Added 1/4 gray foam to dorsal foot/ankle and fixated with 6 cm 6 cm Roman sandal and and ASH pattern combined 12 cm heel lock and then  spiral to knee Folded stockinette back over top of both bandages and put pts grippy hospital socks she brought and then surgical booties on bil feet Pt reports bandages both comfortable at end of session.       PATIENT EDUCATION:  Education details: Bil LE exs in sitting and standing Person educated: Patient Education method: Explanation, demonstration, and handout issued  Education comprehension: verbalized understanding, returned demo and will benefit from review  HOME EXERCISE PROGRAM: Use her velcro garment from wound care center, try to sleep out of the recliner , keep leg clean and dry Bil LE remedial exs in sitting and standing: Alt march, LAQ's, heel raises and ankle pumps, mini squats with walker  ASSESSMENT:  CLINICAL IMPRESSION: 10th visit note sent for Medicare. Pts circumference had temporarily increased today due to pt unintentionally applying TG soft rolled down under knee on Monday when she doffed Fridays bandages. She understands what happened now and will be more mindful of this in the future. Also heavily encouraged her to have her husband help her don velcro garments in the future when she removes her bandages as this is what they are for. Pt seemed to understand this and at end of session requested for us  to review donning them at next session. During session spoke with pt about Flexitouch compression pump and she is interested in pursuing this to see if her insurance will cover this. So with pts permission, sent demographics to [employer] today.    OBJECTIVE IMPAIRMENTS: decreased knowledge of condition, decreased knowledge of use of DME, decreased mobility, and increased edema.   ACTIVITY LIMITATIONS: locomotion level  PARTICIPATION LIMITATIONS: community activity  PERSONAL FACTORS: Time since onset of injury/illness/exacerbation and 1-2 comorbidities: CHF, CKD are also affecting patient's functional outcome.   REHAB POTENTIAL: Fair  CLINICAL DECISION MAKING:  high complexity   EVALUATION COMPLEXITY: High   GOALS: Goals reviewed with patient? Yes  SHORT TERM GOALS: Target date: 11/08/24  Pt will be educated on what lymphedema is and how it is best managed  Baseline: Goal status: MET  2.  Pt will be educated on risk reduction and infection prevention including skin care Baseline:  Goal status:MET   LONG TERM GOALS: Target date: 01/06/25  Pt will be ind with final compression for bil lower extremities  Baseline:  Goal status: ONGOING  2.  Pt will be educated on pump and pursue a trial if wanted Baseline: 11/20/24 - pt instructed on this today and info sent to [employer] with pts permission for insurance verification Goal status: ONGOING  3.  Pt will be ind with remedial exercise  Baseline: 11/20/24 - pt does seated remedial exs, will cont to instruct in standing ones when she is able Goal status: PARTIALLY MET  4. Pt will return to no open wounds on the Lt lower leg  11/20/24 - as of last week she had no drainage from Lt LE wound, but there was some drainage present this week due to recent flare up; anticipate this to improve by over next few sessions as circumference reduces PARTIALLY MET    PLAN:  PT FREQUENCY: 3x/week  PT DURATION: 8 weeks   PLANNED INTERVENTIONS: 97110-Therapeutic exercises, 97530- Therapeutic activity, 97112- Neuromuscular re-education, 97140- Manual therapy, Patient/Family education, Manual lymph drainage, Compression bandaging, DME instructions, and Self Care  PLAN FOR NEXT SESSION: Review with pt and have her try applying velcro garments; Cont with bandaging bil lower legs with need for wound care prn, and try application of velcro to Rt lower leg as her swelling here is less and will be manageable with velcro once ready; cont washing of the legs and feet.  [place]?  Aden Berwyn Caldron, PTA 11/20/2024, 4:00 PM Grayce Sheldon, PT 11/22/24 5:46 PM   "

## 2024-11-22 ENCOUNTER — Ambulatory Visit

## 2024-11-22 DIAGNOSIS — I890 Lymphedema, not elsewhere classified: Secondary | ICD-10-CM

## 2024-11-22 DIAGNOSIS — M6281 Muscle weakness (generalized): Secondary | ICD-10-CM

## 2024-11-22 DIAGNOSIS — R2681 Unsteadiness on feet: Secondary | ICD-10-CM

## 2024-11-22 NOTE — Therapy (Signed)
 " OUTPATIENT PHYSICAL THERAPY  LOWER EXTREMITY ONCOLOGY TREATMENT  Patient Name: URIEL HORKEY MRN: [id number] DOB:1946-01-13, 79 y.o., female Today's Date: 11/22/2024  END OF SESSION:  PT End of Session - 11/22/24 1040     Visit Number 11    Number of Visits 25    Date for Recertification  01/06/25    Authorization Type none    Progress Note Due on Visit 20    PT Start Time 1047    PT Stop Time 1142    PT Time Calculation (min) 55 min    Activity Tolerance Patient tolerated treatment well    Behavior During Therapy [facility] for tasks assessed/performed            Past Medical History:  Diagnosis Date   Allergic rhinitis    Allergy    Anxiety    Arthritis    Ascending aorta dilation 09/13/2022   Echo 09/12/2022: EF 50-55, GR 1 DD, mildly reduced RVSF, normal PASP (RVSP 17.7), mild dilation of ascending aorta (38 mm), RAP 3   Breast cancer (HCC) 04/28/2020   rigtht breast   Bronchitis    CAD (coronary artery disease)    1/19 PCI/DES to pLCX for ISR, normal EF.    Cataract    Cervical dysplasia    unsure of procedure, possible burning in her late 73s   CHF (congestive heart failure) (HCC)    Echo 06/2019: EF 55-60, elevated LVEDP, normal RV SF, mild MAC, mild MR, trivial TR   Complication of anesthesia    COPD (chronic obstructive pulmonary disease) (HCC)    early    Depression    GERD (gastroesophageal reflux disease)    Gout    Heart murmur    Hyperlipidemia    Hypertension    Low back pain    Myocardial infarction (HCC)    Overactive bladder    PONV (postoperative nausea and vomiting)    Sleep apnea    Past Surgical History:  Procedure Laterality Date   ABDOMINAL HYSTERECTOMY     Partial   ANGIOPLASTY     stent 2007   BREAST EXCISIONAL BIOPSY Right 04/2020   ADH/LCIS   BREAST LUMPECTOMY WITH RADIOACTIVE SEED LOCALIZATION Right 04/28/2020   Procedure: RIGHT BREAST LUMPECTOMY X 2  WITH RADIOACTIVE SEED LOCALIZATION;  Surgeon: Belinda Cough, MD;   Location: [facility];  Service: General;  Laterality: Right;  LMA   BREAST SURGERY     CARDIAC CATHETERIZATION     COLONOSCOPY     CORONARY STENT INTERVENTION N/A 11/06/2017   Procedure: CORONARY STENT INTERVENTION;  Surgeon: Mady Bruckner, MD;  Location: [facility];  Service: Cardiovascular;  Laterality: N/A;   EYE SURGERY     LEFT HEART CATH AND CORONARY ANGIOGRAPHY N/A 11/06/2017   Procedure: LEFT HEART CATH AND CORONARY ANGIOGRAPHY;  Surgeon: Mady Bruckner, MD;  Location: [facility];  Service: Cardiovascular;  Laterality: N/A;   LEFT HEART CATH AND CORONARY ANGIOGRAPHY N/A 07/24/2019   Procedure: LEFT HEART CATH AND CORONARY ANGIOGRAPHY;  Surgeon: Verlin Bruckner BIRCH, MD;  Location: [facility];  Service: Cardiovascular;  Laterality: N/A;   RIGHT/LEFT HEART CATH AND CORONARY ANGIOGRAPHY N/A 09/29/2020   Procedure: RIGHT/LEFT HEART CATH AND CORONARY ANGIOGRAPHY;  Surgeon: Claudene Victory ORN, MD;  Location: [facility];  Service: Cardiovascular;  Laterality: N/A;   ROBOTIC ASSISTED BILATERAL SALPINGO OOPHERECTOMY Bilateral 11/10/2020   Procedure: XI ROBOTIC ASSISTED BILATERAL SALPINGO OOPHORECTOMY WITH MINI LAPAROTOMY FOR DRAINAGE;  Surgeon: Viktoria Comer SAUNDERS, MD;  Location: [facility];  Service: Gynecology;  Laterality: Bilateral;  MINI LAP FIRST   TONSILLECTOMY     VULVECTOMY N/A 11/10/2020   Procedure: WIDE EXCISION VULVECTOMY;  Surgeon: Viktoria Comer SAUNDERS, MD;  Location: [facility];  Service: Gynecology;  Laterality: N/A;   Patient Active Problem List   Diagnosis Date Noted   PAD (peripheral artery disease) 04/29/2024   Weakness 10/05/2023   Bronchopneumonia 10/05/2023   Multifocal pneumonia 09/29/2023   Hypokalemia 08/07/2023   Intertrigo 04/18/2023   Venous stasis dermatitis of both lower extremities 11/28/2022   Ascending aorta dilation 09/13/2022   Dizziness 01/19/2022   Frequent falls 01/19/2022   Tremor of both hands 08/16/2021    Aortic atherosclerosis 06/15/2021   Generalized osteoarthritis of multiple sites 12/25/2020   Chronic pain disorder 12/25/2020   Pelvic mass in female    Bilateral lower extremity edema 04/13/2020   Shortness of breath    Anxiety disorder 10/29/2018   CKD (chronic kidney disease), stage III (HCC) 06/27/2018   COPD (chronic obstructive pulmonary disease) (HCC) 05/25/2018   Obesity 03/20/2018   Dyspnea on exertion 11/06/2017   Abnormal stress test 11/06/2017   Chronic heart failure with preserved ejection fraction (HCC) 09/04/2017   Left ventricular dysfunction 07/31/2017   Cataract, nuclear sclerotic senile, bilateral 02/01/2017   Gouty arthritis of toe of left foot 08/09/2016   Hyperuricemia 08/09/2016   Osteoarthritis (arthritis due to wear and tear of joints) 01/14/2014   Atherosclerosis of native coronary artery of native heart with angina pectoris 08/02/2011   Mitral regurgitation 08/02/2011   OVERACTIVE BLADDER 07/02/2010   ACUTE CYSTITIS 05/25/2010   Mixed hyperlipidemia 08/11/2009   Leg edema, left 07/22/2009   GERD 04/11/2008   Constipation 04/11/2008   Essential hypertension 05/30/2007   MYOCARDIAL INFARCTION, HX OF 05/30/2007   Allergic rhinitis 05/30/2007   LOW BACK PAIN 05/30/2007     REFERRING PROVIDER: Betty Jordan, MD  REFERRING DIAG: I89.0  THERAPY DIAG:  Lymphedema, not elsewhere classified  Unsteadiness on feet  Muscle weakness (generalized)  ONSET DATE: unknown  Rationale for Evaluation and Treatment: Rehabilitation  SUBJECTIVE:                                                                                                                                                                                           SUBJECTIVE STATEMENT:   I took the bandages off about 0600 this morning to help you out. The toes weren't quite as painful. [employer] already texted me to let me know they are verifying my insurance.    PERTINENT HISTORY: HTN, CAD,  COPD, OA, CKD stage  3, Controlled CHF but does have some soft pitting edema bilaterally. 11/10/20: Robotic BSO and removal of left labial mass and fallopian tube 18cm cystic mass, pathology showed serous cystadenoma and benign hidradenoma papilliferum. Size difference noted even back in 2020 with negative doppler for DVT. Recurrent cellultis in the leg.   PAIN:  Are you having pain? Not currently related to lymphedema, toes felt better with this bandage  PRECAUTIONS: Recurrent infections on the Lt leg, not cured by antibiotics multiple times.   RED FLAGS: None   WEIGHT BEARING RESTRICTIONS: No  FALLS:  Has patient fallen in last 6 months? Yes. Number of falls 4-5 I was walking around the house I can't go upstairs anymore.   I was walking and tripped   LIVING ENVIRONMENT: Lives with: lives with their family and lives with their spouse Lives in: House/apartment Has following equipment at home: [employer] - 4 wheeled and SPC , bedside commode, Using baby wipes for showering Can get dressed ind but difficult Sleeping in a chair  OCCUPATION: retired   LEISURE: not addressed   PRIOR LEVEL OF FUNCTION: Independent with basic ADLs - see living environment   PATIENT GOALS: improve legs and mobility    OBJECTIVE: Note: Objective measures were completed at Evaluation unless otherwise noted.  COGNITION: Overall cognitive status: Within functional limits for tasks assessed   PALPATION: Deferred due to leg status  OBSERVATIONS / OTHER ASSESSMENTS: Lt LE is larger than Rt up to groin.  Rt leg has edema evident mostly around the ankle with indentions from her shoe and mild redness. She has an ace wrap around the ankle and lower leg, The Lt leg Lt is a brighter red color.  Various very wet bandages on the lateral and posterior leg that are covering up blisters and that are essentially stuck on to the skin due to time on the leg.  White covering over the legs that pt reports is neosporin and  desitin.  Ace wrap over the bandages.  Wearing wet sock and shoe with a harsh odor of exudate.  Also some thick scaly hyperkeratosis on the front of the left leg and some lymphorrhea bumps and skin thickening on the back of the leg. No open places noted, but scabbing over the original wound site and various red blisters that have popped.                Prior Photo from June  Photo from 10/14/24   Photos from 10/30/24     11/08/24    LYMPHEDEMA ASSESSMENTS:  LOWER EXTREMITY LANDMARK RIGHT 04/15/24 11/20/2024  At groin     30 cm proximal to suprapatella     20 cm proximal to suprapatella     10 cm proximal to suprapatella     At midpatella / popliteal crease     30 cm proximal to floor at lateral plantar foot 39 40.5  20 cm proximal to floor at lateral plantar foot 29.5 25.2  10 cm proximal to floor at lateral plantar foot 28.3 27.9  Circumference of ankle/heel     5 cm proximal to 1st MTP joint 23.3 25.7  Across MTP joint 23.2 24.1  Around proximal great toe 8.0 7.6  (Blank rows = not tested)   LOWER EXTREMITY LANDMARK LEFT 04/12/24 In supine today 04/15/24 10/30/2024 11/20/2024  At groin        30 cm proximal to suprapatella        20 cm proximal to suprapatella  10 cm proximal to suprapatella 52.2      At midpatella / popliteal crease 43.2      30 cm proximal to floor at lateral plantar foot 44.6 43.5 40.6 48.5  20 cm proximal to floor at lateral plantar foot 40.5 40.2 36.7 41.4  10 cm proximal to floor at lateral plantar foot 31.6 31.4 30.9 31.2  Circumference of ankle/heel        5 cm proximal to 1st MTP joint 23.4 23.3 24.4 26.7  Across MTP joint 22.7 23 23.5 24.4  Around proximal great toe 7.6 8.0 7.9 7.7  (Blank rows = not tested)  GAIT: Distance walked: walks with rollator walker, slow gait  Outcome measure:  Extreme difficulty/unable (0), Quite a bit of difficulty (1), Moderate difficulty (2), Little difficulty (3), No difficulty (4) Survey date:  10/14/24  Any  of your usual work, housework or school activities 1  2. Usual hobbies, recreational or sporting activities 1  3. Getting into/out of the bath 0  4. Walking between rooms 1  5. Putting on socks/shoes 1  6. Squatting  0  7. Lifting an object, like a bag of groceries from the floor 0  8. Performing light activities around your home 0  9. Performing heavy activities around your home 0  10. Getting into/out of a car 1  11. Walking 2 blocks 0  12. Walking 1 mile 0  13. Going up/down 10 stairs (1 flight) 0  14. Standing for 1 hour 0  15.  sitting for 1 hour 1  16. Running on even ground 0  17. Running on uneven ground 0  18. Making sharp turns while running fast 0  19. Hopping  0  20. Rolling over in bed 0  Score total:  6/80    ABC scale The Activities-Specific Balance Confidence (ABC) Scale 0% 10 20 30  40 50 60 70 80 90 100% No confidence<->completely confident  How confident are you that you will not lose your balance or become unsteady when you . . .   Date tested 10/14/24  Walk around the house 40%  2. Walk up or down stairs 0%  3. Bend over and pick up a slipper from in front of a closet floor 10%  4. Reach for a small can off a shelf at eye level 10%  5. Stand on tip toes and reach for something above your head 0%  6. Stand on a chair and reach for something 0%  7. Sweep the floor 0%  8. Walk outside the house to a car parked in the driveway 10%  9. Get into or out of a car 10%  10. Walk across a parking lot to the mall 0%  11. Walk up or down a ramp 0%  12. Walk in a crowded mall where people rapidly walk past you 10%  13. Are bumped into by people as you walk through the mall 10%  14. Step onto or off of an escalator while you are holding onto the railing 10%  15. Step onto or off an escalator while holding onto parcels such that you cannot hold onto the railing 0%  16. Walk outside on icy sidewalks 0%  Total: #/16 6.8% confidence in balance  TREATMENT DATE:  11/22/24: Manual Therapy, Application of Bandages  Removed bandages and washed legs assessing skin. A reasonable amount of dry, flaky skin sloughed off during washing legs today by PTA from Lt LE. Encouraged pt that when she washes her legs this weekend to try gently scrubbing the same as was done today with her long handle soft brush she reports to having at home. Today there was small amount of drainage at posterior Lt LE that appears to be more weeping edema than drainage from wound as her wound still appears to be well healed and before re-bandaging pts LE small dots of drainage were present at posterior lower leg. There was a new blister present at Lt anterior lower leg horizontally about the size of a pinky. Showed pt this and then explained about possible weeping edema is probable due to recent flare up of edema and circumference which caused her skin to become thin. This should improve as circumference conts to reduce.  Compression Bandaging as follows with cocoa butter applied to both LE's:  Rt LE: Non adhesive 1 large gauze on shin where blister healed but skin still very fragile; med TG soft from foot to knee, Mollelast to toes 1-4 folding bandage in half for toes 3 and 4, and then Artiflex from foot to knee, doubly this for extra padding over shin  Short stretch compression bandages: 6 cm Roman Sandal to foot into ASH 12 cm spiral from ankle to knee with heel lock Lt LE: Non adhesive bandage 1 small size over new closed blister that may pop under bandage, and 1 large over healing blister at posterior lower leg where small amount of drainage was present today. TG large from foot to knee and Mollelast to toe 1-4, and then Artiflex (1.5 rolls today) from foot to knee, doubly this at posterior leg.  Short stretch compression bandages: Added 1/4 gray foam to dorsal foot near toes  to ankle and fixated with 6 cm 6 cm Roman sandal and and ASH pattern combined 12 cm heel lock and then spiral to knee Added second 12 cm spiral from ankle to knee to facilitate further reduction Folded stockinette back over top of both bandages, surgical booties on bil feet and then fixated grocery bags with tape over this to keep pts feet dry.  Pt reports bandages both comfortable at end of session.  11/20/24: Manual Therapy, Application of Bandages  Removed bandages and washed legs assessing skin. TG soft that pt had applied had rolled down at top of lower Lt leg which caused much increased swelling, see above circumference measurements. Her Rt LE though is improved since beginning of this episode. Explained to pt importance of folding Tg soft over to create wide band at top of leg instead of the roll that created a tourniquet that she had unintentionally caused. She verbalized good understanding and will be moindufl of this in future. Also advised pt that she has to have compression on her legs when she removes bandages and needs to wear her velcro garments when not in bandages to keep circumference reduced.  Large blister is now no longer red or open at Lt posterior calf but there was a small amount if clear drainage where skin had been stretched with recent increased circumference since last session. There continues to be scabbing and dry skin. The blister on the Rt shin conts to heal well.  Applied wound care coverings  - nonstick under both tg soft to anterior Rt leg and posterior Lt.  Compression Bandaging as follows with cocoa butter applied to both LE's:  Rt LE: Non adhesive 2 small gauze over healing blister on shin; med TG soft from foot to knee, Mollelast to toes 1-4 folding bandage in half for toes 3 and 4, and then Artiflex from foot to knee  Short stretch compression bandages: 6 cm Roman Sandal to foot into ASH 12 cm spiral from ankle to knee with heel lock Lt LE: Non adhesive bandage 2  small size overlapping to cover posterior calf blister TG large from foot to knee and Mollelast to toe 1-4, and then Artiflex from foot to knee.  Short stretch compression bandages: Added 1/4 gray foam to dorsal foot near toes to ankle and fixated with 6 cm 6 cm Roman sandal and and ASH pattern combined 12 cm heel lock and then spiral to knee Folded stockinette back over top of both bandages, surgical booties on bil feet and then fixated grocery bags over this to keep pts feet dry.  Pt reports bandages both comfortable at end of session.  11/15/2024 Toes and distal area of bilateral feet extremely red. Asked  therapist Saddie Raw, PT to check and she did think they were slightly more red than usual,d but felt she was OK to wrap, Manual Therapy, Application of Bandages  Removed bandages and washed legs assessing skin.  Large blister is now no longer red or open at Lt posterior calf. There continues to be scabbing and dry skin. The blister on the Rt shin is healing well.  Applied wound care coverings  - nonstick under both tg soft to anterior Rt leg and posterior Lt to include thickening of skin that had a small amount of drainage on last non adhesive dressing.  Compression Bandaging as follows with cocoa butter applied to both LE's:  Rt LE: Non adhesive large gauze over healing blister on shin; med TG soft from foot to knee, Mollelast to toes 1-4 folding bandage in half for toes 3 and 4, and then Artiflex from foot to knee with extra padding on the shin.  Short stretch compression bandages: 6 cm Roman Sandal to foot into ASH 12 cm spiral from ankle to knee with heel lock Lt LE: Non adhesive bandage x 1 large size covering posterior calf blister to superior to achilles where new thick and mildly draining skin noted, with no ABD pad due to very mild drainage after last time, but extra padding with artiflex to posterior blister.   TG large from foot to knee and Mollelast to toe 1-4 folding bandage in  half for toes 3 and 4, and then Artiflex from foot to knee.  Short stretch compression bandages: Added 1/4 gray foam to dorsal foot/ankle and fixated with 6 cm 6 cm Roman sandal and and ASH pattern combined 12 cm heel lock and then spiral to knee Folded stockinette back over top of both bandages and put pts grippy hospital socks she brought and then surgical booties on bil feet Pt reports bandages both comfortable at end of session.       PATIENT EDUCATION:  Education details: Bil LE exs in sitting and standing Person educated: Patient Education method: Explanation, demonstration, and handout issued Education comprehension: verbalized understanding, returned demo and will benefit from review  HOME EXERCISE PROGRAM: Use her velcro garment from wound care center, try to sleep out of the recliner , keep leg clean and dry Bil LE remedial exs in sitting and standing: Alt march, LAQ's, heel raises and ankle pumps,  mini squats with walker  ASSESSMENT:  CLINICAL IMPRESSION: Continued with compression bandaging to bil LE's. Some visible reduction noted at Lt LE but new closed blister present at anterior lower leg and some weeping edema noted around area of healing blister at posterior leg. Skin appears to be intact, probable due to sudden increase since last week which increased skin fragility. Pt to remove bandages over weekend to wash legs and bandages.    OBJECTIVE IMPAIRMENTS: decreased knowledge of condition, decreased knowledge of use of DME, decreased mobility, and increased edema.   ACTIVITY LIMITATIONS: locomotion level  PARTICIPATION LIMITATIONS: community activity  PERSONAL FACTORS: Time since onset of injury/illness/exacerbation and 1-2 comorbidities: CHF, CKD are also affecting patient's functional outcome.   REHAB POTENTIAL: Fair  CLINICAL DECISION MAKING: high complexity   EVALUATION COMPLEXITY: High   GOALS: Goals reviewed with patient? Yes  SHORT TERM GOALS:  Target date: 11/08/24  Pt will be educated on what lymphedema is and how it is best managed  Baseline: Goal status: MET  2.  Pt will be educated on risk reduction and infection prevention including skin care Baseline:  Goal status:MET   LONG TERM GOALS: Target date: 01/06/25  Pt will be ind with final compression for bil lower extremities  Baseline:  Goal status: ONGOING  2.  Pt will be educated on pump and pursue a trial if wanted Baseline: 11/20/24 - pt instructed on this today and info sent to [employer] with pts permission for insurance verification Goal status: ONGOING  3.  Pt will be ind with remedial exercise  Baseline: 11/20/24 - pt does seated remedial exs, will cont to instruct in standing ones when she is able Goal status: PARTIALLY MET  4. Pt will return to no open wounds on the Lt lower leg  11/20/24 - as of last week she had no drainage from Lt LE wound, but there was some drainage present this week due to recent flare up; anticipate this to improve by over next few sessions as circumference reduces PARTIALLY MET    PLAN:  PT FREQUENCY: 3x/week  PT DURATION: 8 weeks   PLANNED INTERVENTIONS: 97110-Therapeutic exercises, 97530- Therapeutic activity, 97112- Neuromuscular re-education, 97140- Manual therapy, Patient/Family education, Manual lymph drainage, Compression bandaging, DME instructions, and Self Care  PLAN FOR NEXT SESSION: Review with pt and have her try applying velcro garments; Cont with bandaging bil lower legs with need for wound care prn, and try application of velcro to Rt lower leg as her swelling here is less and will be manageable with velcro once ready; cont washing of the legs and feet.  [place]?  Aden Berwyn Caldron, PTA 11/22/2024, 11:58 AM  "

## 2024-11-25 ENCOUNTER — Ambulatory Visit

## 2024-11-27 ENCOUNTER — Ambulatory Visit

## 2024-11-29 ENCOUNTER — Ambulatory Visit

## 2024-12-02 ENCOUNTER — Ambulatory Visit

## 2024-12-04 ENCOUNTER — Ambulatory Visit

## 2024-12-06 ENCOUNTER — Ambulatory Visit

## 2024-12-09 ENCOUNTER — Ambulatory Visit

## 2024-12-11 ENCOUNTER — Ambulatory Visit

## 2024-12-13 ENCOUNTER — Ambulatory Visit

## 2024-12-16 ENCOUNTER — Ambulatory Visit: Admitting: Rehabilitation

## 2024-12-18 ENCOUNTER — Ambulatory Visit: Admitting: Rehabilitation

## 2024-12-20 ENCOUNTER — Ambulatory Visit: Admitting: Rehabilitation

## 2024-12-24 ENCOUNTER — Ambulatory Visit

## 2025-02-06 ENCOUNTER — Ambulatory Visit: Admitting: Dermatology
